# Patient Record
Sex: Female | Born: 1960
Health system: Southern US, Community
[De-identification: ages and names within clinical notes are randomized; demographics above are authoritative.]

## PROBLEM LIST (undated history)

## (undated) DIAGNOSIS — T148XXA Other injury of unspecified body region, initial encounter: Secondary | ICD-10-CM

## (undated) DIAGNOSIS — F79 Unspecified intellectual disabilities: Secondary | ICD-10-CM

## (undated) DIAGNOSIS — N179 Acute kidney failure, unspecified: Secondary | ICD-10-CM

## (undated) DIAGNOSIS — K219 Gastro-esophageal reflux disease without esophagitis: Secondary | ICD-10-CM

## (undated) DIAGNOSIS — J189 Pneumonia, unspecified organism: Secondary | ICD-10-CM

## (undated) DIAGNOSIS — G253 Myoclonus: Secondary | ICD-10-CM

## (undated) DIAGNOSIS — R519 Headache, unspecified: Secondary | ICD-10-CM

## (undated) DIAGNOSIS — R1310 Dysphagia, unspecified: Secondary | ICD-10-CM

## (undated) DIAGNOSIS — R251 Tremor, unspecified: Secondary | ICD-10-CM

## (undated) DIAGNOSIS — Z8619 Personal history of other infectious and parasitic diseases: Secondary | ICD-10-CM

## (undated) DIAGNOSIS — R569 Unspecified convulsions: Secondary | ICD-10-CM

## (undated) DIAGNOSIS — K5900 Constipation, unspecified: Secondary | ICD-10-CM

## (undated) DIAGNOSIS — G9340 Encephalopathy, unspecified: Secondary | ICD-10-CM

## (undated) DIAGNOSIS — Z515 Encounter for palliative care: Secondary | ICD-10-CM

## (undated) DIAGNOSIS — D696 Thrombocytopenia, unspecified: Secondary | ICD-10-CM

## (undated) HISTORY — DX: Other injury of unspecified body region, initial encounter: T14.8XXA

## (undated) HISTORY — PX: MOUTH SURGERY: SHX715

## (undated) HISTORY — DX: Unspecified convulsions: R56.9

## (undated) HISTORY — PX: COLONOSCOPY: SHX174

## (undated) HISTORY — PX: TONSILLECTOMY AND ADENOIDECTOMY: SUR1326

## (undated) HISTORY — PX: CATARACT EXTRACTION: SUR2

## (undated) HISTORY — PX: TOTAL ABDOMINAL HYSTERECTOMY: SHX209

## (undated) HISTORY — DX: Gastro-esophageal reflux disease without esophagitis: K21.9

## (undated) HISTORY — DX: Unspecified intellectual disabilities: F79

## (undated) HISTORY — DX: Personal history of other infectious and parasitic diseases: Z86.19

---

## 1998-11-03 ENCOUNTER — Other Ambulatory Visit: Admission: RE | Admit: 1998-11-03 | Discharge: 1998-11-03 | Payer: Self-pay | Admitting: *Deleted

## 1999-01-08 ENCOUNTER — Other Ambulatory Visit: Admission: RE | Admit: 1999-01-08 | Discharge: 1999-01-08 | Payer: Self-pay | Admitting: *Deleted

## 1999-02-13 ENCOUNTER — Other Ambulatory Visit: Admission: RE | Admit: 1999-02-13 | Discharge: 1999-02-13 | Payer: Self-pay | Admitting: *Deleted

## 1999-02-13 ENCOUNTER — Encounter (INDEPENDENT_AMBULATORY_CARE_PROVIDER_SITE_OTHER): Payer: Self-pay | Admitting: Specialist

## 1999-03-23 ENCOUNTER — Encounter: Payer: Self-pay | Admitting: *Deleted

## 1999-03-26 ENCOUNTER — Observation Stay (HOSPITAL_COMMUNITY): Admission: RE | Admit: 1999-03-26 | Discharge: 1999-03-27 | Payer: Self-pay | Admitting: *Deleted

## 1999-03-26 ENCOUNTER — Encounter (INDEPENDENT_AMBULATORY_CARE_PROVIDER_SITE_OTHER): Payer: Self-pay

## 1999-10-26 ENCOUNTER — Encounter: Payer: Self-pay | Admitting: Emergency Medicine

## 1999-10-26 ENCOUNTER — Emergency Department (HOSPITAL_COMMUNITY): Admission: EM | Admit: 1999-10-26 | Discharge: 1999-10-26 | Payer: Self-pay | Admitting: Emergency Medicine

## 2000-01-08 ENCOUNTER — Other Ambulatory Visit: Admission: RE | Admit: 2000-01-08 | Discharge: 2000-01-08 | Payer: Self-pay | Admitting: *Deleted

## 2001-01-12 ENCOUNTER — Other Ambulatory Visit: Admission: RE | Admit: 2001-01-12 | Discharge: 2001-01-12 | Payer: Self-pay | Admitting: *Deleted

## 2001-03-02 ENCOUNTER — Ambulatory Visit (HOSPITAL_COMMUNITY): Admission: RE | Admit: 2001-03-02 | Discharge: 2001-03-02 | Payer: Self-pay | Admitting: Family Medicine

## 2001-03-02 ENCOUNTER — Encounter: Payer: Self-pay | Admitting: Family Medicine

## 2002-11-12 ENCOUNTER — Ambulatory Visit (HOSPITAL_COMMUNITY): Admission: RE | Admit: 2002-11-12 | Discharge: 2002-11-12 | Payer: Self-pay | Admitting: Internal Medicine

## 2003-04-26 ENCOUNTER — Ambulatory Visit (HOSPITAL_COMMUNITY): Admission: RE | Admit: 2003-04-26 | Discharge: 2003-04-26 | Payer: Self-pay | Admitting: Internal Medicine

## 2004-04-17 ENCOUNTER — Ambulatory Visit (HOSPITAL_COMMUNITY): Admission: RE | Admit: 2004-04-17 | Discharge: 2004-04-17 | Payer: Self-pay | Admitting: Family Medicine

## 2004-04-25 ENCOUNTER — Ambulatory Visit (HOSPITAL_COMMUNITY): Admission: RE | Admit: 2004-04-25 | Discharge: 2004-04-25 | Payer: Self-pay | Admitting: Family Medicine

## 2004-07-23 ENCOUNTER — Ambulatory Visit (HOSPITAL_COMMUNITY): Admission: RE | Admit: 2004-07-23 | Discharge: 2004-07-23 | Payer: Self-pay | Admitting: Obstetrics and Gynecology

## 2004-08-14 ENCOUNTER — Ambulatory Visit: Payer: Self-pay | Admitting: Internal Medicine

## 2004-08-24 ENCOUNTER — Ambulatory Visit: Payer: Self-pay | Admitting: Internal Medicine

## 2004-08-24 ENCOUNTER — Ambulatory Visit (HOSPITAL_COMMUNITY): Admission: RE | Admit: 2004-08-24 | Discharge: 2004-08-24 | Payer: Self-pay | Admitting: Internal Medicine

## 2004-11-26 ENCOUNTER — Ambulatory Visit: Payer: Self-pay | Admitting: Internal Medicine

## 2005-07-10 ENCOUNTER — Ambulatory Visit (HOSPITAL_COMMUNITY): Admission: RE | Admit: 2005-07-10 | Discharge: 2005-07-10 | Payer: Self-pay | Admitting: Family Medicine

## 2005-10-23 ENCOUNTER — Emergency Department (HOSPITAL_COMMUNITY): Admission: EM | Admit: 2005-10-23 | Discharge: 2005-10-23 | Payer: Self-pay | Admitting: Emergency Medicine

## 2005-11-26 ENCOUNTER — Ambulatory Visit: Payer: Self-pay | Admitting: Internal Medicine

## 2007-01-07 ENCOUNTER — Emergency Department (HOSPITAL_COMMUNITY): Admission: EM | Admit: 2007-01-07 | Discharge: 2007-01-07 | Payer: Self-pay | Admitting: Emergency Medicine

## 2007-07-16 ENCOUNTER — Ambulatory Visit (HOSPITAL_COMMUNITY): Admission: RE | Admit: 2007-07-16 | Discharge: 2007-07-16 | Payer: Self-pay | Admitting: Family Medicine

## 2007-07-22 ENCOUNTER — Encounter (HOSPITAL_COMMUNITY): Admission: RE | Admit: 2007-07-22 | Discharge: 2007-08-21 | Payer: Self-pay | Admitting: Oncology

## 2007-07-22 ENCOUNTER — Ambulatory Visit (HOSPITAL_COMMUNITY): Payer: Self-pay | Admitting: Oncology

## 2007-08-24 ENCOUNTER — Encounter (HOSPITAL_COMMUNITY): Admission: RE | Admit: 2007-08-24 | Discharge: 2007-09-23 | Payer: Self-pay | Admitting: Oncology

## 2007-09-16 ENCOUNTER — Ambulatory Visit (HOSPITAL_COMMUNITY): Payer: Self-pay | Admitting: Oncology

## 2007-12-21 ENCOUNTER — Encounter (HOSPITAL_COMMUNITY): Admission: RE | Admit: 2007-12-21 | Discharge: 2008-01-20 | Payer: Self-pay | Admitting: Oncology

## 2007-12-21 ENCOUNTER — Ambulatory Visit (HOSPITAL_COMMUNITY): Payer: Self-pay | Admitting: Oncology

## 2008-03-18 ENCOUNTER — Ambulatory Visit (HOSPITAL_COMMUNITY): Payer: Self-pay | Admitting: Oncology

## 2008-03-18 ENCOUNTER — Encounter (HOSPITAL_COMMUNITY): Admission: RE | Admit: 2008-03-18 | Discharge: 2008-04-17 | Payer: Self-pay | Admitting: Oncology

## 2008-06-28 ENCOUNTER — Encounter: Payer: Self-pay | Admitting: Orthopedic Surgery

## 2008-06-28 ENCOUNTER — Ambulatory Visit (HOSPITAL_COMMUNITY): Admission: RE | Admit: 2008-06-28 | Discharge: 2008-06-28 | Payer: Self-pay | Admitting: Internal Medicine

## 2008-06-30 ENCOUNTER — Encounter: Payer: Self-pay | Admitting: Orthopedic Surgery

## 2008-06-30 ENCOUNTER — Inpatient Hospital Stay (HOSPITAL_COMMUNITY): Admission: AD | Admit: 2008-06-30 | Discharge: 2008-07-01 | Payer: Self-pay

## 2008-06-30 ENCOUNTER — Encounter: Payer: Self-pay | Admitting: Emergency Medicine

## 2008-07-11 ENCOUNTER — Emergency Department (HOSPITAL_COMMUNITY): Admission: EM | Admit: 2008-07-11 | Discharge: 2008-07-11 | Payer: Self-pay | Admitting: Emergency Medicine

## 2008-07-13 ENCOUNTER — Ambulatory Visit: Payer: Self-pay | Admitting: Orthopedic Surgery

## 2008-07-13 DIAGNOSIS — S42033A Displaced fracture of lateral end of unspecified clavicle, initial encounter for closed fracture: Secondary | ICD-10-CM

## 2008-07-13 HISTORY — DX: Displaced fracture of lateral end of unspecified clavicle, initial encounter for closed fracture: S42.033A

## 2008-07-18 ENCOUNTER — Encounter: Payer: Self-pay | Admitting: Orthopedic Surgery

## 2008-07-21 ENCOUNTER — Emergency Department (HOSPITAL_COMMUNITY): Admission: EM | Admit: 2008-07-21 | Discharge: 2008-07-21 | Payer: Self-pay | Admitting: Emergency Medicine

## 2008-08-08 ENCOUNTER — Ambulatory Visit: Payer: Self-pay | Admitting: Orthopedic Surgery

## 2008-08-09 ENCOUNTER — Encounter (HOSPITAL_COMMUNITY): Admission: RE | Admit: 2008-08-09 | Discharge: 2008-09-08 | Payer: Self-pay | Admitting: Orthopedic Surgery

## 2009-05-04 ENCOUNTER — Emergency Department (HOSPITAL_COMMUNITY): Admission: EM | Admit: 2009-05-04 | Discharge: 2009-05-04 | Payer: Self-pay | Admitting: Emergency Medicine

## 2009-09-09 ENCOUNTER — Emergency Department (HOSPITAL_COMMUNITY): Admission: EM | Admit: 2009-09-09 | Discharge: 2009-09-09 | Payer: Self-pay | Admitting: Emergency Medicine

## 2009-09-14 ENCOUNTER — Emergency Department (HOSPITAL_COMMUNITY): Admission: EM | Admit: 2009-09-14 | Discharge: 2009-09-15 | Payer: Self-pay | Admitting: Emergency Medicine

## 2009-10-03 ENCOUNTER — Ambulatory Visit (HOSPITAL_COMMUNITY): Admission: RE | Admit: 2009-10-03 | Discharge: 2009-10-03 | Payer: Self-pay | Admitting: Orthopedic Surgery

## 2009-10-03 ENCOUNTER — Ambulatory Visit: Payer: Self-pay | Admitting: Vascular Surgery

## 2009-10-03 ENCOUNTER — Encounter (INDEPENDENT_AMBULATORY_CARE_PROVIDER_SITE_OTHER): Payer: Self-pay | Admitting: Orthopedic Surgery

## 2009-10-12 ENCOUNTER — Inpatient Hospital Stay (HOSPITAL_COMMUNITY): Admission: EM | Admit: 2009-10-12 | Discharge: 2009-10-19 | Payer: Self-pay | Admitting: Emergency Medicine

## 2009-10-17 ENCOUNTER — Ambulatory Visit: Payer: Self-pay | Admitting: Internal Medicine

## 2009-10-18 ENCOUNTER — Encounter (INDEPENDENT_AMBULATORY_CARE_PROVIDER_SITE_OTHER): Payer: Self-pay

## 2009-10-18 ENCOUNTER — Ambulatory Visit: Payer: Self-pay | Admitting: Internal Medicine

## 2009-10-19 ENCOUNTER — Ambulatory Visit: Payer: Self-pay | Admitting: Internal Medicine

## 2009-10-20 ENCOUNTER — Encounter: Payer: Self-pay | Admitting: Internal Medicine

## 2009-11-01 ENCOUNTER — Encounter: Payer: Self-pay | Admitting: Internal Medicine

## 2009-11-03 ENCOUNTER — Encounter: Payer: Self-pay | Admitting: Internal Medicine

## 2009-11-09 ENCOUNTER — Ambulatory Visit (HOSPITAL_COMMUNITY): Admission: RE | Admit: 2009-11-09 | Discharge: 2009-11-09 | Payer: Self-pay | Admitting: Family Medicine

## 2010-03-28 ENCOUNTER — Ambulatory Visit: Payer: Self-pay | Admitting: Internal Medicine

## 2010-04-07 ENCOUNTER — Emergency Department (HOSPITAL_COMMUNITY): Admission: EM | Admit: 2010-04-07 | Discharge: 2010-04-07 | Payer: Self-pay | Admitting: Emergency Medicine

## 2010-06-24 ENCOUNTER — Encounter: Payer: Self-pay | Admitting: Internal Medicine

## 2010-06-24 ENCOUNTER — Encounter: Payer: Self-pay | Admitting: Family Medicine

## 2010-07-03 NOTE — Letter (Signed)
Summary: Patient Notice, Endo Biopsy Results  [facility]  [address]   [place], [place] [address]   Phone: [phone]  Fax: [phone]       Oct 20, 2009   Maria [name] [address] [place], [place]  [address] 1960-06-09    Dear Maria [name],  I am pleased to inform you that the biopsies taken during your recent endoscopic examination did not show any evidence of cancer or other abnormality upon pathologic examination.  Additional information/recommendations:  No further action is needed at this time.  Please follow-up with your primary care physician for your other healthcare needs.  Continue with the treatment plan as outlined on the day of your exam.  Please call us if you are having persistent problems or have questions about your condition that have not been fully answered at this time.  Sincerely,    R. Roetta Sessions MD, FACP [facility] Ph: [phone]   Fax: [phone]   Appended Document: Patient Notice, Endo Biopsy Results Letter mailed to pt.

## 2010-07-03 NOTE — Consult Note (Signed)
Summary: Consultation Report  Consultation Report   Imported By: Peggyann Shoals 11/01/2009 13:35:34  _____________________________________________________________________  External Attachment:    Type:   Image     Comment:   External Document

## 2010-07-03 NOTE — Letter (Signed)
Summary: Discharge Summary  Discharge Summary   Imported By: Minna Merritts 11/03/2009 15:32:56  _____________________________________________________________________  External Attachment:    Type:   Image     Comment:   External Document

## 2010-08-14 LAB — DIFFERENTIAL
Basophils Relative: 0 % (ref 0–1)
Eosinophils Absolute: 0.1 10*3/uL (ref 0.0–0.7)
Monocytes Absolute: 0.6 10*3/uL (ref 0.1–1.0)
Monocytes Relative: 12 % (ref 3–12)
Neutro Abs: 2.6 10*3/uL (ref 1.7–7.7)

## 2010-08-14 LAB — URINALYSIS, ROUTINE W REFLEX MICROSCOPIC
Bilirubin Urine: NEGATIVE
Glucose, UA: NEGATIVE mg/dL
Hgb urine dipstick: NEGATIVE
Ketones, ur: NEGATIVE mg/dL
Nitrite: NEGATIVE
Protein, ur: NEGATIVE mg/dL
Specific Gravity, Urine: 1.01 (ref 1.005–1.030)
Urobilinogen, UA: 0.2 mg/dL (ref 0.0–1.0)
pH: 7.5 (ref 5.0–8.0)

## 2010-08-14 LAB — COMPREHENSIVE METABOLIC PANEL
ALT: 25 U/L (ref 0–35)
Albumin: 3.6 g/dL (ref 3.5–5.2)
Alkaline Phosphatase: 56 U/L (ref 39–117)
Potassium: 3.9 mEq/L (ref 3.5–5.1)
Sodium: 136 mEq/L (ref 135–145)
Total Protein: 6.1 g/dL (ref 6.0–8.3)

## 2010-08-14 LAB — CBC
Platelets: 145 10*3/uL — ABNORMAL LOW (ref 150–400)
RDW: 13.3 % (ref 11.5–15.5)
WBC: 5 10*3/uL (ref 4.0–10.5)

## 2010-08-14 LAB — PROTIME-INR: INR: 0.97 (ref 0.00–1.49)

## 2010-08-14 LAB — PREGNANCY, URINE: Preg Test, Ur: NEGATIVE

## 2010-08-20 LAB — DIFFERENTIAL
Basophils Absolute: 0 10*3/uL (ref 0.0–0.1)
Basophils Absolute: 0 10*3/uL (ref 0.0–0.1)
Basophils Absolute: 0 10*3/uL (ref 0.0–0.1)
Basophils Absolute: 0 10*3/uL (ref 0.0–0.1)
Basophils Absolute: 0 10*3/uL (ref 0.0–0.1)
Basophils Relative: 0 % (ref 0–1)
Basophils Relative: 0 % (ref 0–1)
Basophils Relative: 1 % (ref 0–1)
Basophils Relative: 1 % (ref 0–1)
Eosinophils Absolute: 0 10*3/uL (ref 0.0–0.7)
Eosinophils Absolute: 0 10*3/uL (ref 0.0–0.7)
Eosinophils Absolute: 0 10*3/uL (ref 0.0–0.7)
Eosinophils Absolute: 0.1 10*3/uL (ref 0.0–0.7)
Eosinophils Absolute: 0.2 10*3/uL (ref 0.0–0.7)
Eosinophils Relative: 0 % (ref 0–5)
Eosinophils Relative: 1 % (ref 0–5)
Eosinophils Relative: 1 % (ref 0–5)
Eosinophils Relative: 3 % (ref 0–5)
Lymphocytes Relative: 13 % (ref 12–46)
Lymphocytes Relative: 36 % (ref 12–46)
Lymphocytes Relative: 37 % (ref 12–46)
Lymphocytes Relative: 41 % (ref 12–46)
Lymphs Abs: 0.7 10*3/uL (ref 0.7–4.0)
Lymphs Abs: 0.9 10*3/uL (ref 0.7–4.0)
Lymphs Abs: 1 10*3/uL (ref 0.7–4.0)
Lymphs Abs: 1.5 10*3/uL (ref 0.7–4.0)
Monocytes Absolute: 0.4 10*3/uL (ref 0.1–1.0)
Monocytes Absolute: 0.4 10*3/uL (ref 0.1–1.0)
Monocytes Absolute: 0.5 10*3/uL (ref 0.1–1.0)
Monocytes Absolute: 0.7 10*3/uL (ref 0.1–1.0)
Monocytes Relative: 14 % — ABNORMAL HIGH (ref 3–12)
Monocytes Relative: 9 % (ref 3–12)
Neutro Abs: 1.3 10*3/uL — ABNORMAL LOW (ref 1.7–7.7)
Neutro Abs: 1.4 10*3/uL — ABNORMAL LOW (ref 1.7–7.7)
Neutro Abs: 1.6 10*3/uL — ABNORMAL LOW (ref 1.7–7.7)
Neutro Abs: 4.2 10*3/uL (ref 1.7–7.7)
Neutrophils Relative %: 43 % (ref 43–77)
Neutrophils Relative %: 43 % (ref 43–77)
Neutrophils Relative %: 50 % (ref 43–77)
Neutrophils Relative %: 58 % (ref 43–77)
Neutrophils Relative %: 78 % — ABNORMAL HIGH (ref 43–77)

## 2010-08-20 LAB — CBC
HCT: 22.2 % — ABNORMAL LOW (ref 36.0–46.0)
HCT: 24.2 % — ABNORMAL LOW (ref 36.0–46.0)
HCT: 24.5 % — ABNORMAL LOW (ref 36.0–46.0)
HCT: 26.6 % — ABNORMAL LOW (ref 36.0–46.0)
Hemoglobin: 8 g/dL — ABNORMAL LOW (ref 12.0–15.0)
Hemoglobin: 8.1 g/dL — ABNORMAL LOW (ref 12.0–15.0)
Hemoglobin: 8.5 g/dL — ABNORMAL LOW (ref 12.0–15.0)
Hemoglobin: 9.4 g/dL — ABNORMAL LOW (ref 12.0–15.0)
MCHC: 35.4 g/dL (ref 30.0–36.0)
MCHC: 35.4 g/dL (ref 30.0–36.0)
MCHC: 36.1 g/dL — ABNORMAL HIGH (ref 30.0–36.0)
MCHC: 36.1 g/dL — ABNORMAL HIGH (ref 30.0–36.0)
MCV: 92.6 fL (ref 78.0–100.0)
MCV: 93.1 fL (ref 78.0–100.0)
MCV: 93.2 fL (ref 78.0–100.0)
MCV: 93.6 fL (ref 78.0–100.0)
MCV: 93.8 fL (ref 78.0–100.0)
Platelets: 129 10*3/uL — ABNORMAL LOW (ref 150–400)
Platelets: 151 10*3/uL (ref 150–400)
Platelets: 71 10*3/uL — ABNORMAL LOW (ref 150–400)
Platelets: 75 10*3/uL — ABNORMAL LOW (ref 150–400)
Platelets: 83 10*3/uL — ABNORMAL LOW (ref 150–400)
Platelets: 93 10*3/uL — ABNORMAL LOW (ref 150–400)
RBC: 2.4 MIL/uL — ABNORMAL LOW (ref 3.87–5.11)
RBC: 2.48 MIL/uL — ABNORMAL LOW (ref 3.87–5.11)
RBC: 2.85 MIL/uL — ABNORMAL LOW (ref 3.87–5.11)
RDW: 13.1 % (ref 11.5–15.5)
RDW: 13.2 % (ref 11.5–15.5)
RDW: 13.3 % (ref 11.5–15.5)
RDW: 13.4 % (ref 11.5–15.5)
RDW: 13.5 % (ref 11.5–15.5)
RDW: 13.7 % (ref 11.5–15.5)
WBC: 2.9 10*3/uL — ABNORMAL LOW (ref 4.0–10.5)
WBC: 3.4 10*3/uL — ABNORMAL LOW (ref 4.0–10.5)
WBC: 4.2 10*3/uL (ref 4.0–10.5)
WBC: 5.4 10*3/uL (ref 4.0–10.5)

## 2010-08-20 LAB — BASIC METABOLIC PANEL
BUN: 4 mg/dL — ABNORMAL LOW (ref 6–23)
BUN: 4 mg/dL — ABNORMAL LOW (ref 6–23)
BUN: 6 mg/dL (ref 6–23)
BUN: 6 mg/dL (ref 6–23)
BUN: 9 mg/dL (ref 6–23)
CO2: 24 mEq/L (ref 19–32)
CO2: 24 mEq/L (ref 19–32)
CO2: 25 mEq/L (ref 19–32)
CO2: 27 mEq/L (ref 19–32)
Calcium: 8.1 mg/dL — ABNORMAL LOW (ref 8.4–10.5)
Calcium: 8.2 mg/dL — ABNORMAL LOW (ref 8.4–10.5)
Calcium: 8.2 mg/dL — ABNORMAL LOW (ref 8.4–10.5)
Calcium: 8.5 mg/dL (ref 8.4–10.5)
Chloride: 100 mEq/L (ref 96–112)
Chloride: 102 mEq/L (ref 96–112)
Chloride: 102 mEq/L (ref 96–112)
Chloride: 106 mEq/L (ref 96–112)
Creatinine, Ser: 0.7 mg/dL (ref 0.4–1.2)
Creatinine, Ser: 0.71 mg/dL (ref 0.4–1.2)
Creatinine, Ser: 0.75 mg/dL (ref 0.4–1.2)
Creatinine, Ser: 0.78 mg/dL (ref 0.4–1.2)
GFR calc Af Amer: >60 mL/min (ref >60)
GFR calc Af Amer: >60 mL/min (ref >60)
GFR calc Af Amer: >60 mL/min (ref >60)
GFR calc non Af Amer: >60 mL/min (ref >60)
GFR calc non Af Amer: >60 mL/min (ref >60)
GFR calc non Af Amer: >60 mL/min (ref >60)
Glucose, Bld: 103 mg/dL — ABNORMAL HIGH (ref 70–99)
Glucose, Bld: 104 mg/dL — ABNORMAL HIGH (ref 70–99)
Glucose, Bld: 112 mg/dL — ABNORMAL HIGH (ref 70–99)
Glucose, Bld: 97 mg/dL (ref 70–99)
Potassium: 4.3 mEq/L (ref 3.5–5.1)
Potassium: 4.4 mEq/L (ref 3.5–5.1)
Potassium: 4.5 mEq/L (ref 3.5–5.1)
Sodium: 131 mEq/L — ABNORMAL LOW (ref 135–145)
Sodium: 132 mEq/L — ABNORMAL LOW (ref 135–145)
Sodium: 138 mEq/L (ref 135–145)
Sodium: 139 mEq/L (ref 135–145)

## 2010-08-20 LAB — HEPARIN INDUCED THROMBOCYTOPENIA PNL
Patient O.D.: 0.123
UFH High Dose UFH H: 0 % Release
UFH Low Dose 0.5 IU/mL: 0 % Release
UFH SRA Result: NEGATIVE

## 2010-08-20 LAB — PROTIME-INR
INR: 0.96 (ref 0.00–1.49)
Prothrombin Time: 12.7 seconds (ref 11.6–15.2)

## 2010-08-20 LAB — CORTISOL-AM, BLOOD: Cortisol - AM: 11 ug/dL (ref 4.3–22.4)

## 2010-08-20 LAB — VANCOMYCIN, TROUGH: Vancomycin Tr: 12.2 ug/mL (ref 10.0–20.0)

## 2010-08-20 LAB — CLOSTRIDIUM DIFFICILE EIA

## 2010-08-20 LAB — MAGNESIUM: Magnesium: 1.8 mg/dL (ref 1.5–2.5)

## 2010-08-21 LAB — URINE MICROSCOPIC-ADD ON

## 2010-08-21 LAB — COMPREHENSIVE METABOLIC PANEL
ALT: 32 U/L (ref 0–35)
ALT: 43 U/L — ABNORMAL HIGH (ref 0–35)
AST: 37 U/L (ref 0–37)
Alkaline Phosphatase: 62 U/L (ref 39–117)
BUN: 17 mg/dL (ref 6–23)
CO2: 24 mEq/L (ref 19–32)
CO2: 25 mEq/L (ref 19–32)
Calcium: 8.5 mg/dL (ref 8.4–10.5)
Calcium: 9.2 mg/dL (ref 8.4–10.5)
Chloride: 98 mEq/L (ref 96–112)
GFR calc Af Amer: >60 mL/min (ref >60)
GFR calc non Af Amer: >60 mL/min (ref >60)
GFR calc non Af Amer: >60 mL/min (ref >60)
Glucose, Bld: 115 mg/dL — ABNORMAL HIGH (ref 70–99)
Potassium: 3.5 mEq/L (ref 3.5–5.1)
Potassium: 4.2 mEq/L (ref 3.5–5.1)
Sodium: 130 mEq/L — ABNORMAL LOW (ref 135–145)
Sodium: 132 mEq/L — ABNORMAL LOW (ref 135–145)
Total Bilirubin: 0.6 mg/dL (ref 0.3–1.2)

## 2010-08-21 LAB — PREGNANCY, URINE: Preg Test, Ur: NEGATIVE

## 2010-08-21 LAB — CARDIAC PANEL(CRET KIN+CKTOT+MB+TROPI)
CK, MB: 0.4 ng/mL (ref 0.3–4.0)
Relative Index: INVALID (ref 0.0–2.5)
Relative Index: INVALID (ref 0.0–2.5)
Relative Index: INVALID (ref 0.0–2.5)
Total CK: 86 U/L (ref 7–177)
Total CK: 94 U/L (ref 7–177)

## 2010-08-21 LAB — URINALYSIS, ROUTINE W REFLEX MICROSCOPIC
Bilirubin Urine: NEGATIVE
Glucose, UA: NEGATIVE mg/dL
Glucose, UA: NEGATIVE mg/dL
Hgb urine dipstick: NEGATIVE
Leukocytes, UA: NEGATIVE
Nitrite: NEGATIVE
Protein, ur: NEGATIVE mg/dL
Protein, ur: NEGATIVE mg/dL
Specific Gravity, Urine: 1.025 (ref 1.005–1.030)
Urobilinogen, UA: 0.2 mg/dL (ref 0.0–1.0)
Urobilinogen, UA: 0.2 mg/dL (ref 0.0–1.0)
pH: 5.5 (ref 5.0–8.0)

## 2010-08-21 LAB — CBC
HCT: 25.4 % — ABNORMAL LOW (ref 36.0–46.0)
HCT: 30.6 % — ABNORMAL LOW (ref 36.0–46.0)
Hemoglobin: 11 g/dL — ABNORMAL LOW (ref 12.0–15.0)
Hemoglobin: 9 g/dL — ABNORMAL LOW (ref 12.0–15.0)
MCHC: 35.3 g/dL (ref 30.0–36.0)
MCHC: 35.9 g/dL (ref 30.0–36.0)
MCV: 93.9 fL (ref 78.0–100.0)
RBC: 2.71 MIL/uL — ABNORMAL LOW (ref 3.87–5.11)
RBC: 2.85 MIL/uL — ABNORMAL LOW (ref 3.87–5.11)
RBC: 3.3 MIL/uL — ABNORMAL LOW (ref 3.87–5.11)
WBC: 8.5 10*3/uL (ref 4.0–10.5)

## 2010-08-21 LAB — CARBAMAZEPINE LEVEL, TOTAL: Carbamazepine Lvl: 8.6 ug/mL (ref 4.0–12.0)

## 2010-08-21 LAB — BASIC METABOLIC PANEL
BUN: 13 mg/dL (ref 6–23)
CO2: 23 mEq/L (ref 19–32)
CO2: 26 mEq/L (ref 19–32)
Calcium: 8 mg/dL — ABNORMAL LOW (ref 8.4–10.5)
Chloride: 96 mEq/L (ref 96–112)
Creatinine, Ser: 0.91 mg/dL (ref 0.4–1.2)
GFR calc Af Amer: >60 mL/min (ref >60)
GFR calc non Af Amer: >60 mL/min (ref >60)
Glucose, Bld: 105 mg/dL — ABNORMAL HIGH (ref 70–99)
Glucose, Bld: 119 mg/dL — ABNORMAL HIGH (ref 70–99)
Potassium: 3.3 mEq/L — ABNORMAL LOW (ref 3.5–5.1)
Potassium: 4.7 mEq/L (ref 3.5–5.1)
Sodium: 125 mEq/L — ABNORMAL LOW (ref 135–145)
Sodium: 128 mEq/L — ABNORMAL LOW (ref 135–145)

## 2010-08-21 LAB — VALPROIC ACID LEVEL: Valproic Acid Lvl: 67 ug/mL (ref 50.0–100.0)

## 2010-08-21 LAB — MYCOPLASMA / UREAPLASMA CULTURE

## 2010-08-21 LAB — DIFFERENTIAL
Basophils Absolute: 0 10*3/uL (ref 0.0–0.1)
Basophils Relative: 0 % (ref 0–1)
Eosinophils Absolute: 0 10*3/uL (ref 0.0–0.7)
Eosinophils Absolute: 0 10*3/uL (ref 0.0–0.7)
Eosinophils Relative: 0 % (ref 0–5)
Lymphocytes Relative: 4 % — ABNORMAL LOW (ref 12–46)
Lymphs Abs: 0.8 10*3/uL (ref 0.7–4.0)
Neutro Abs: 6.1 10*3/uL (ref 1.7–7.7)
Neutro Abs: 6.5 10*3/uL (ref 1.7–7.7)
Neutrophils Relative %: 83 % — ABNORMAL HIGH (ref 43–77)

## 2010-08-21 LAB — CULTURE, BLOOD (ROUTINE X 2): Culture: NO GROWTH

## 2010-08-21 LAB — LIPID PANEL
Cholesterol: 169 mg/dL (ref 0–200)
Total CHOL/HDL Ratio: 3.6 RATIO
VLDL: 14 mg/dL (ref 0–40)

## 2010-08-21 LAB — SODIUM, URINE, RANDOM: Sodium, Ur: 27 mEq/L

## 2010-08-21 LAB — OSMOLALITY, URINE: Osmolality, Ur: 523 mOsm/kg (ref 390–1090)

## 2010-08-21 LAB — CREATININE, URINE, RANDOM: Creatinine, Urine: 154.64 mg/dL

## 2010-08-22 LAB — DIFFERENTIAL
Lymphocytes Relative: 27 % (ref 12–46)
Lymphs Abs: 1.6 10*3/uL (ref 0.7–4.0)
Neutro Abs: 3.6 10*3/uL (ref 1.7–7.7)
Neutrophils Relative %: 62 % (ref 43–77)

## 2010-08-22 LAB — BASIC METABOLIC PANEL
BUN: 20 mg/dL (ref 6–23)
Creatinine, Ser: 0.99 mg/dL (ref 0.4–1.2)
GFR calc non Af Amer: >60 mL/min (ref >60)
Potassium: 4.2 mEq/L (ref 3.5–5.1)

## 2010-08-22 LAB — URINALYSIS, ROUTINE W REFLEX MICROSCOPIC
Nitrite: NEGATIVE
Specific Gravity, Urine: 1.01 (ref 1.005–1.030)
pH: 7 (ref 5.0–8.0)

## 2010-08-22 LAB — RAPID URINE DRUG SCREEN, HOSP PERFORMED
Cocaine: NOT DETECTED
Tetrahydrocannabinol: NOT DETECTED

## 2010-08-22 LAB — ETHANOL: Alcohol, Ethyl (B): <5 mg/dL (ref 0–10)

## 2010-08-22 LAB — CBC
HCT: 31 % — ABNORMAL LOW (ref 36.0–46.0)
Platelets: 145 10*3/uL — ABNORMAL LOW (ref 150–400)
WBC: 5.8 10*3/uL (ref 4.0–10.5)

## 2010-08-22 LAB — VALPROIC ACID LEVEL: Valproic Acid Lvl: 48 ug/mL — ABNORMAL LOW (ref 50.0–100.0)

## 2010-09-04 LAB — BASIC METABOLIC PANEL
GFR calc non Af Amer: >60 mL/min (ref >60)
Potassium: 4.1 mEq/L (ref 3.5–5.1)
Sodium: 140 mEq/L (ref 135–145)

## 2010-09-04 LAB — CARBAMAZEPINE LEVEL, TOTAL: Carbamazepine Lvl: 4.6 ug/mL (ref 4.0–12.0)

## 2010-09-04 LAB — CBC
HCT: 37.4 % (ref 36.0–46.0)
Hemoglobin: 12.8 g/dL (ref 12.0–15.0)
Platelets: 168 10*3/uL (ref 150–400)
WBC: 5.9 10*3/uL (ref 4.0–10.5)

## 2010-09-04 LAB — VALPROIC ACID LEVEL: Valproic Acid Lvl: 92 ug/mL (ref 50.0–100.0)

## 2010-09-04 LAB — DIFFERENTIAL
Eosinophils Relative: 1 % (ref 0–5)
Lymphocytes Relative: 21 % (ref 12–46)
Lymphs Abs: 1.2 10*3/uL (ref 0.7–4.0)
Neutro Abs: 4 10*3/uL (ref 1.7–7.7)

## 2010-09-17 LAB — DIFFERENTIAL
Basophils Absolute: 0 10*3/uL (ref 0.0–0.1)
Eosinophils Absolute: 0 10*3/uL (ref 0.0–0.7)
Eosinophils Relative: 3 % (ref 0–5)
Eosinophils Relative: 0 % (ref 0–5)
Lymphocytes Relative: 29 % (ref 12–46)
Lymphocytes Relative: 15 % (ref 12–46)
Lymphs Abs: 2 10*3/uL (ref 0.7–4.0)
Lymphs Abs: 1.4 10*3/uL (ref 0.7–4.0)
Monocytes Absolute: 0.6 10*3/uL (ref 0.1–1.0)
Monocytes Relative: 9 % (ref 3–12)
Neutro Abs: 4 10*3/uL (ref 1.7–7.7)
Neutrophils Relative %: 80 % — ABNORMAL HIGH (ref 43–77)

## 2010-09-17 LAB — CBC
HCT: 31.5 % — ABNORMAL LOW (ref 36.0–46.0)
Hemoglobin: 10.9 g/dL — ABNORMAL LOW (ref 12.0–15.0)
MCV: 98.5 fL (ref 78.0–100.0)
Platelets: 161 10*3/uL (ref 150–400)
RBC: 3.2 MIL/uL — ABNORMAL LOW (ref 3.87–5.11)
RDW: 13.6 % (ref 11.5–15.5)
RDW: 13.5 % (ref 11.5–15.5)
WBC: 6.8 10*3/uL (ref 4.0–10.5)
WBC: 9.7 10*3/uL (ref 4.0–10.5)

## 2010-09-17 LAB — URINALYSIS, ROUTINE W REFLEX MICROSCOPIC
Bilirubin Urine: NEGATIVE
Ketones, ur: 40 mg/dL — AB
Nitrite: NEGATIVE
Protein, ur: NEGATIVE mg/dL
Urobilinogen, UA: 1 mg/dL (ref 0.0–1.0)
pH: 8 (ref 5.0–8.0)

## 2010-09-17 LAB — BASIC METABOLIC PANEL
GFR calc Af Amer: >60 mL/min (ref >60)
GFR calc non Af Amer: >60 mL/min (ref >60)
Glucose, Bld: 101 mg/dL — ABNORMAL HIGH (ref 70–99)
Potassium: 3.9 mEq/L (ref 3.5–5.1)
Sodium: 137 mEq/L (ref 135–145)

## 2010-09-17 LAB — RAPID URINE DRUG SCREEN, HOSP PERFORMED: Tetrahydrocannabinol: NOT DETECTED

## 2010-09-17 LAB — VALPROIC ACID LEVEL
Valproic Acid Lvl: 55.8 ug/mL (ref 50.0–100.0)
Valproic Acid Lvl: 100 ug/mL (ref 50.0–100.0)

## 2010-09-17 LAB — POCT I-STAT, CHEM 8
Calcium, Ion: 1.24 mmol/L (ref 1.12–1.32)
Chloride: 102 mEq/L (ref 96–112)
Glucose, Bld: 101 mg/dL — ABNORMAL HIGH (ref 70–99)
HCT: 38 % (ref 36.0–46.0)
Hemoglobin: 12.9 g/dL (ref 12.0–15.0)

## 2010-09-17 LAB — CARBAMAZEPINE LEVEL, TOTAL: Carbamazepine Lvl: 17.6 ug/mL (ref 4.0–12.0)

## 2010-09-18 LAB — COMPREHENSIVE METABOLIC PANEL
AST: 15 U/L (ref 0–37)
Albumin: 4 g/dL (ref 3.5–5.2)
Alkaline Phosphatase: 73 U/L (ref 39–117)
BUN: 22 mg/dL (ref 6–23)
Chloride: 91 mEq/L — ABNORMAL LOW (ref 96–112)
Creatinine, Ser: 0.9 mg/dL (ref 0.4–1.2)
GFR calc Af Amer: >60 mL/min (ref >60)
Potassium: 3.6 mEq/L (ref 3.5–5.1)
Total Protein: 6.6 g/dL (ref 6.0–8.3)

## 2010-09-18 LAB — DIFFERENTIAL
Eosinophils Relative: 1 % (ref 0–5)
Lymphocytes Relative: 23 % (ref 12–46)
Monocytes Absolute: 0.4 10*3/uL (ref 0.1–1.0)
Monocytes Relative: 6 % (ref 3–12)
Neutro Abs: 3.9 10*3/uL (ref 1.7–7.7)

## 2010-09-18 LAB — CBC
HCT: 38.4 % (ref 36.0–46.0)
Platelets: 132 10*3/uL — ABNORMAL LOW (ref 150–400)
RDW: 13.6 % (ref 11.5–15.5)
WBC: 5.7 10*3/uL (ref 4.0–10.5)

## 2010-09-18 LAB — URINALYSIS, ROUTINE W REFLEX MICROSCOPIC
Glucose, UA: NEGATIVE mg/dL
Protein, ur: NEGATIVE mg/dL
Urobilinogen, UA: 0.2 mg/dL (ref 0.0–1.0)

## 2010-09-18 LAB — PREGNANCY, URINE: Preg Test, Ur: NEGATIVE

## 2010-09-18 LAB — CARBAMAZEPINE LEVEL, TOTAL: Carbamazepine Lvl: 10.7 ug/mL (ref 4.0–12.0)

## 2010-09-18 LAB — VALPROIC ACID LEVEL: Valproic Acid Lvl: 62 ug/mL (ref 50.0–100.0)

## 2010-10-16 NOTE — H&P (Addendum)
NAME:  Maria [name], Maria [name] NO.:  [id number]   MEDICAL RECORD NO.:  [id number]          PATIENT TYPE:  INP   LOCATION:  A313                          FACILITY:  [facility]   PHYSICIAN:  Skeet Latch, DO    DATE OF BIRTH:  05/08/1961   DATE OF ADMISSION:  06/30/2008  DATE OF DISCHARGE:  LH                              HISTORY & PHYSICAL   PRIMARY CARE PHYSICIAN:  Patrica Duel, M.D.   CHIEF COMPLAINT:  Dizziness.   HISTORY OF PRESENT ILLNESS:  This is a 50 year old female who presents  with family members with a complaint of dizziness, ataxia, and some  mental status changes.  Apparently this evening the patient presented to  [facility] ER with the above symptoms.  The patient has a history of  mental retardation, seizure disorder, and a Tegretol and Depakote level  was performed and her Tegretol level was elevated.  The patient had a CT  of her head performed which was unremarkable for any acute changes.  We  were called secondary to the patient living in [place] and secondary  to adverse weather supposedly on its way in the next day or so and the  mother wanted the patient to be admitted at [facility] to be  closer to home.  So far her workup has been unremarkable except for  elevated Tegretol.  Family members state that the patient has had  increased problems with ambulation due to frequent falls and did  fracture a collarbone recently.   PAST MEDICAL HISTORY:  1. Depression.  2. Acid reflux.  3. Mental retardation.  4. Seizure disorder.   PAST SURGICAL HISTORY:  Hysterectomy.   SOCIAL HISTORY:  No history of alcohol or drug abuse.  She is a  cigarette smoker.   ALLERGIES:  CODEINE.   HOME MEDICATIONS:  1. __________ 300 mg in the morning, 600 mg at bedtime.  2. Omeprazole 20 mg twice a day.  3. Depakote ER 500 mg in the morning and 1000 mg in the evening.  4. She is on a children's vitamin daily.  5. Calcium with Vitamin D two times a day.  6. Xalatan ophthalmic eye drops at bedtime.   REVIEW OF SYSTEMS:  GENERAL:  No fever, no weight gain.  Positive for  decreased appetite.  No weight loss.  HEENT:  Unremarkable.  CARDIOVASCULAR:  No chest pain or palpitations.  RESPIRATORY:  No cough,  wheeze, or ____ QA MARKER: 166 ___.  GENI  urgency, or frequency.  MUSCULOSKELETAL:  Positive for right  shoulder/pain in her collarbone area.  GASTROINTESTINAL:  Slight  abdominal pain.  No diarrhea.  Other systems are unremarkable.   PHYSICAL EXAMINATION:  VITAL SIGNS:  Temperature 98.6, respiratory rate  15, pulse 81, blood pressure 113/75.  GENERAL:  This is a well-developed, well-nourished, well-hydrated female  in no acute distress.  She is awake and alert.  HEENT:  Head is atraumatic, normocephalic.  Eyes:  PERRLA.  EOMI.  NECK:  Soft, supple, nontender, nondistended.  CARDIOVASCULAR:  Regular rate and rhythm.  No murmurs, rubs, or gallops.  LUNGS:  Clear to auscultation bilaterally.  No rales, rhonchi, or  wheezes.  ABDOMEN:  Soft.  There is some slight tenderness in the mid abdomen  area.  No rigidity or guarding.  Positive bowel sounds.  EXTREMITIES:  No clubbing, cyanosis, or edema.  She does tenderness to  palpation of her right clavicle area.  NEUROLOGIC:  Cranial nerves II-XII are grossly intact.  Patient alert  and oriented x3.  Moves all extremities.   LABORATORY DATA:  Sodium 136, potassium 4.1, chloride 102, BUN 17,  creatinine 1.1, glucose 101, ionized calcium is 1.24.  Hemoglobin 12.9,  hematocrit 38, white count 9.7, platelet count is 161,000.   Right shoulder x-ray showed a boot fracture of the lateral aspect of the  clavicle approximately 2 cm from acromioclavicular articulation, lateral  to the level of the coracoclavicular ligament.  Her acromioclavicular  and coracoclavicular relationship appear preserved.  The fracture was  displaced 2-3 mm, no other acute bony abnormalities.  X-ray of her left  hip was  unremarkable.  CT of her head showed negative for bleed or other  acute intracranial processes.  Stable diffuse atrophy, focal right  frontal and parenchymal wall.   Her carbazocine level was 17.6, Depakote level was 100.   ASSESSMENT:  This is a 50 year old female with history of mental  retardation and seizure disorder who presented with some dizziness,  ataxia, and frequent falls over the past week.  Apparently the patient  fell and fractured her right clavicle.  The patient presented to [facility] with the above symptoms, was found to have an elevated  Tegretol level and was transferred to [facility] secondary to  the patient living in the [place] area.Marland Kitchen   PLAN:  1. The patient will be admitted to the service of [facility] bed.  2. For her mental status changes/dizziness probably secondary to      elevated Tegretol level, the patient's Depakote level is on the      high end of normal also.  At this time we will hold both      medications, then get a neurology consult.  3. For history of gastroesophageal reflux disease the patient was      placed on reflux agent at this time.  4. The patient will followed very closely.  5. The patient will be on DVT as well as GI prophylaxis.      Skeet Latch, DO  Electronically Signed     SM/MEDQ  D:  06/30/2008  T:  07/01/2008  Job:  [id number]   cc:   Patrica Duel, M.D.  Fax: [phone]

## 2010-10-16 NOTE — Consult Note (Signed)
NAME:  Maria [name], Maria [name]               ACCOUNT NO.:  [id number]   MEDICAL RECORD NO.:  [id number]          PATIENT TYPE:  INP   LOCATION:  A313                          FACILITY:  [facility]   PHYSICIAN:  Kofi A. Gerilyn Pilgrim, M.D. DATE OF BIRTH:  [birth year]   DATE OF CONSULTATION:  DATE OF DISCHARGE:  07/01/2008                                 CONSULTATION   REASON FOR CONSULTATION:  Dizziness.   The patient is a 50 year old white female who has a baseline history of  epilepsy, has been intractable.  There is also a history of mental  retardation that appears to be mild.  The patient lives independtly  although she has close support with mother and other siblings.  The  patient has had dizziness, ataxia, and some change in mentation.  Recently, she has had a couple of falls, one recently resulted in  fracture of the clavicle.  The patient was evaluated in the emergency  room and was found to have an elevated carbamazepine level and  subsequently admitted.  The family reports that she has been on  phenobarbital and Dilantin in the past for seizures.  She sees  neurologist in [place] presumed from the [facility].  She has  been placed on some Depakote in the past in the recent years, this has  made a dramatic improvement in the seizures.  She previously would have  multiple seizures a day, sometimes  6 seizures or  more, but with the  Depakote and Tegretol, she now only has about one a month.  It does not  appear to be that there have been any changes in the medication at least  not recently that I can ascertain.  She has had a couple of falls per  the sister related to her seizures in addition to the recent onset of  dizziness.   PAST MEDICAL HISTORY:  1. Epilepsy.  2. Mental retardation.  3. Mild gastroesophageal reflux disease.  4. Depression.   PAST SURGICAL HISTORY:  Hysterectomy.   SOCIAL HISTORY:  She lives independently, but has close family support.  No alcohol use.  The  patient is a smoker.  No illicit drug use.   ALLERGIES:  CODEINE.   HOME MEDICATIONS:  1. Carbatrol 300 mg 1 in the morning and 2 at bedtime.  2. Depakote ER 500 mg in the morning and 1000 mg at bedtime.  3. Calcium with vitamin D b.i.d.  4. Children's Vicodin.  5. Omeprazole 20 mg b.i.d.   REVIEW OF SYSTEMS:  As stated in the history of present illness,  otherwise unrevealing.   PHYSICAL EXAMINATION:  GENERAL:  A thin pleasant lady, in no acute  distress.  VITAL SIGNS:  Temperature 98.2, pulse 83, respirations 20, blood  pressure 136/77.  HEENT:  Head is normocephalic, atraumatic.  NECK:  Supple.  EXTREMITIES:  The left upper extremity is in a sling.  She does have  some superficial ecchymotic spots in the legs, apparently from falling.  These are moderate in size however.  MENTATION:  The patient is awake and alert.  She does converse and is  cooperative with evaluation.  CRANIAL NERVE:  Pupils are equal, round, and reactive.  Visual fields  are intact.  Extraocular movements are full at this time and I see no  nystagmus.  Facial muscle strength is symmetric.  Tongue is midline.  Uvula midline.  MOTOR:  Normal strength involving the left upper extremity and the legs.  The right is weak with 4/5 due to shoulder pain.  Bulk and tone is  normal throughout.  COORDINATION:  No significant dysmetria tremors; past pointing reflexes  are preserved.  Sensation normal to light touch and temperature.   SUPPORTIVE DATA:  Head CT scans reviewed in person and shows marked  cerebral atrophy.  There is also some atrophy involved in the cerebrum  but mild to moderate.  The Tegretol on admission is 17.4 and is now down  to 12.3 as this medication was held overnight.  The Depakote on  admission is 100 and this is also down to 55.8 as this medication was  also held.  Sodium 130, potassium 4.1, chloride 102, BUN 17, creatinine  1.0, glucose 101.  Urinalysis negative.  Urine drug screen  negative.  CBC unremarkable.   IMPRESSION:  1. Carbamazepine toxicity, presented with altered mental status, gait      ataxia, dizziness.  This has improved with the discontinuation of      the medications.  2. Epilepsy, intractable.   RECOMMENDATIONS:  I think we should go ahead and restart the patient  back on her medicines to overt the risk of withdrawal seizures which can  lead to multiple seizures.  I have instructed the patient to have her  mother come in which we did contact by phone, and bring her medications  in.  We will use the patient's medicines and start Depakote and continue  her current dose of 500 mg in the morning and 1000 mg in the night time.  We will also restart the Carbatrol but reduce dose to 300 mg b.i.d.  Plan was discussed at length with the patient and her family.   Thanks for this consultation.      Kofi A. Gerilyn Pilgrim, M.D.  Electronically Signed     KAD/MEDQ  D:  07/01/2008  T:  07/02/2008  Job:  [id number]

## 2010-10-16 NOTE — Consult Note (Signed)
NAME:  Maria [name], Maria [name] NO.:  [id number]   MEDICAL RECORD NO.:  [id number]          [name] TYPE:  EMS   LOCATION:  MAJO                         FACILITY:  [facility]   PHYSICIAN:  Pramod P. Pearlean Brownie, MD    DATE OF BIRTH:  11-Feb-1961   DATE OF CONSULTATION:  DATE OF DISCHARGE:                                 CONSULTATION   REASON FOR REFERRAL:  Abnormal behavior.   HISTORY OF PRESENT ILLNESS:  Maria [name] is a 50 year old Caucasian lady  with mild mental retardation and known lifelong seizure disorder.  She  sees Dr. Anne Hahn in our practice.  Maria [name]'s mother called  Maria  office today saying that she has had bizarre behavior.  She was found  wandering aimlessly in [place] middle [place] wearing only  her night gown.  She, after being brought home, Maria mother went in to  get some food and she found her to be walking towards Maria pool and she  just walked into Maria pool.  She has been confused and not her usual self  since then.  Maria [name] has had seizures, presently  well controlled;  last seizure was less than a year ago and Maria seizures are typically  generalized tonic clonic followed by sleepiness and tiredness and Maria  [name]'s present behavior does not seem to be typical postictal as per  Maria mother.  She has not had any recent cough, cold, diarrhea, fever,  illness, or headache.  She is fairly functional despite her mental  retardation, lives alone, takes her own medicines.   PAST MEDICAL HISTORY:  1. Mental retardation.  2. Seizure disorder.  3. Gastroesophageal reflux disease.   HOME MEDICATIONS:  1. Gabitril 300 mg in Maria morning and 600 mg at night.  2. Omeprazole 20 mg twice daily.  3. Depakote ER 1500 mg daily.   SOCIAL HISTORY:  Maria [name] lives alone.  She does smoke but does not  drink.   REVIEW OF SYSTEMS:  Not significant for any fever, chest pain, cough,  diarrhea, or other illness.   PHYSICAL EXAMINATION:  GENERAL:  Physical  exam reveals a young Caucasian  lady who is not in distress.  VITAL SIGNS:  She is afebrile, with a pulse rate of 72 per minute,  regular.  Respiratory rate 16 per minute.  Blood pressure 180/68.  Saturations 100% on room air.  Distal pulses are well felt.  HEAD:  Nontraumatic.  NECK:  Supple without bruit.  ENT EXAM:  Unremarkable.  CARDIAC EXAM:  No murmur or gallop.  LUNGS:  Clear to auscultation.  ABDOMEN:  Soft, nontender.  NEUROLOGICAL EXAM:  Maria [name] is awake, alert, oriented to time,  place, and person.  There is no aphasia, apraxia, or dysarthria.  Pupils  are 4  mm equal, reactive.  Eye movements are full range without  nystagmus.  She is slow to respond to questions.  She is disoriented to  time.  She is oriented to person and place.  She follows one and two-  step commands.  She has diminished attention and recall.  Motor system  exam reveals no upper extremity drift, symmetric strength, tone,  reflexes, coordination, and sensation.  She walks with a slow steady  gait.   LABORATORY DATA:  Data reviewed.  Basic metabolic panel is normal.  UA  shows 3-6 WBCs with trace leukocyte esterase and bacteria.  Urine drug  screen is negative.  Valproic acid level is low at 26.4.  CT scan of Maria  head shows no acute abnormality.   IMPRESSION:  A 50 year old lady with sudden onset of bizarre, abnormal  behavior of undetermined etiology.  Certainly, with low Depakote levels,  seizure, postictal behavior, or ictal stupor is a possibility but  unlikely given Maria fact that this is not a typical behavior following  her previous seizures.  She does have a urinary tract infection,  possible low-grade urinary tract infection which may perhaps contribute.  Maria [name] being oriented to medical service for further evaluation.  I  would recommend loading her with 1 gram of intravenous Depacon now and  continuing her home dose of 1500 milligrams daily.  Check carbamazepine  level and call me  with Maria results.  Continue her home dose of  carbamazepine for now.  Check ammonia levels, liver function test, urine  cultures.  I will be happy to follow Maria [name] in consults.  Kindly  call for questions.           ______________________________  Sunny Schlein. Pearlean Brownie, MD     PPS/MEDQ  D:  01/07/2007  T:  01/08/2007  Job:  [id number]

## 2010-10-16 NOTE — Discharge Summary (Signed)
NAME:  Maria [name], Maria [name] NO.:  [id number]   MEDICAL RECORD NO.:  [id number]          PATIENT TYPE:  INP   LOCATION:  A313                          FACILITY:  [facility]   PHYSICIAN:  Osvaldo Shipper, MD     DATE OF BIRTH:  1960/11/29   DATE OF ADMISSION:  06/30/2008  DATE OF DISCHARGE:  01/29/2010LH                               DISCHARGE SUMMARY   PMD:  Dr. Nobie Putnam.   NEUROLOGIST:  Dr. Anne Hahn in [place].   During this hospitalization the patient was seen by Dr. Gerilyn Pilgrim.   DIAGNOSES AT THE TIME OF DISCHARGE:  1. Carbamazepine toxicity, improved.  2. Right Clavicular Fracture  3. History of seizure disorder, stable.  4. Mental retardation.  5. Depression.   Please review H and P dictated by Dr. Lilian Kapur.   BRIEF HOSPITAL COURSE:  Briefly this is a 50 year old Caucasian female  who has a history of mental retardation, depression who  started  developing ataxia, dizziness, mental status changes.  The patient's  family took her to her PMD's office who for unclear reason sent her to  the [facility] ED.  In the [facility] ED a CT head was done which was  negative for any acute process.  A right shoulder film was done which  showed evidence for minimally displaced fracture of the lateral aspect  of the right clavicle.  Hip film was done which was negative.  CT of the  abdomen and pelvis was done a couple of days ago because of abdominal  pain apart from small volume of pelvic free fluid.  No other significant  abnormalities were noted.   After blood work it was found out that her Tegretol level was 17.6  within normal range being 4-12.  Valproic acid level was 100.  It was  felt that her symptoms were likely secondary to Tegretol.  Tegretol was  held overnight, and this morning is 12.3.  She has been seen by Dr.  Gerilyn Pilgrim who has adjusted the dose of her carbamazepine, and he feels  that the patient can go home.  The patient continues to be stable.  She  is feeling  a little bit drowsy because she was given morphine for her  right shoulder pain.   For the clavicular fracture she is on a sling, but I would have told  them that it is still recommended that they see an orthopedic  specialist.  Her mother will make an appointment with Dr. Romeo Apple when  she reaches home.  I did offer to make this appointment for her here.   She at this time is medically stable for discharge back to home.   DISCHARGE MEDICATIONS:  1. Carbatrol changed to 300 mg b.i.d.  2. Depakote ER continue as before, 500 mg in the morning and 1000 mg      at bedtime.  3. Omeprazole 20 mg 2 times daily.  4. Children's vitamin 1 tablet daily.  5. Calcium with vitamin D daily.  6. Xalatan ophthalmic solution at bedtime.  7. Percocet 5/325 one tablet p.o. q.6 hours p.r.n. for pain, 20  tablets have been prescribed.   FOLLOW-UP:  1. Follow-up for Carbatrol and Depakote level.  Check next week at her      PMD's office.  2. With Dr. Anne Hahn, her regular neurologist, as scheduled before.  3. With Dr. Romeo Apple.  The patient to make the appointment herself.   DIET:  No restrictions.   PHYSICAL ACTIVITY:  As before.   Total time on this discharge encounter 35 minutes.      Osvaldo Shipper, MD  Electronically Signed     GK/MEDQ  D:  07/01/2008  T:  07/01/2008  Job:  [id number]   cc:   Darleen Crocker A. Gerilyn Pilgrim, M.D.  Fax: [phone]   C. Lesia Sago, M.D.  Fax: [phone]   Patrica Duel, M.D.  Fax: [phone]

## 2010-10-19 NOTE — Op Note (Signed)
NAME:  Maria [name], Maria [name]                         ACCOUNT NO.:  [id number]   MEDICAL RECORD NO.:  [id number]                   PATIENT TYPE:  AMB   LOCATION:  DAY                                  FACILITY:  [facility]   PHYSICIAN:  Lionel December, M.D.                 DATE OF BIRTH:  04/18/1961   DATE OF PROCEDURE:  11/12/2002  DATE OF DISCHARGE:                                 OPERATIVE REPORT   PROCEDURE:  Esophagogastroduodenoscopy with esophageal dilatation.   ENDOSCOPIST:  Lionel December, M.D.   INDICATIONS:  Maria [name] is a 50 year old Caucasian female with chronic GERD who  now presents with solid food dysphagia.  She is presently on Nexium 40 mg  b.i.d.  Dose was increased about 2 weeks ago. She is also complaining of  right lower rib cage pain, right flank pain, and right lower quadrant pain.  This pain is felt to be musculoskeletal as she noticed it more when she  moves about.  This will be further evaluated by Dr. Nobie Putnam, unless he  already has done so.   The procedure and risks were reviewed with the patient and informed consent  was obtained.   PREOPERATIVE MEDICATIONS:  Cetacaine spray for oropharyngeal topical  anesthesia, Demerol 20 mg IV and Versed 7 mg IV in divided dose.   INSTRUMENT:  Olympus video system.   FINDINGS:  Procedure performed in endoscopy suite.  The patient's vital  signs and O2 saturation were monitored during the procedure and remained  stable.  The patient was placed in the left lateral recumbent position and  endoscope was passed via the oropharynx without any difficulty into the  esophagus.   ESOPHAGUS:  Mucosa of the esophagus was normal.  There were 2 rings at  distal esophagus just proximal or close to the GE junction, but these are  not felt to be critical.  The GE junction was serrated; and there was a  small sliding hiatal hernia no more than 3 cm in length.   STOMACH:  It was empty and distended very well with insufflation.  The folds  of  the proximal stomach were normal.  Examination of the mucosa reveals a  single prepyloric erosion, otherwise normal.  The angularis, fundus, and  cardia were examined by retroflexing the scope and were normal.   DUODENUM:  Examination of the bulb revealed normal mucosa.  The mucosa and  folds of the second part of the duodenum were normal.  The endoscope was  withdrawn.   Esophagus was dilated by passing 56 [place] Maloney dilator to full  insertion.  As the dilator was withdrawn the endoscope was passed again and  there was a small mucosal disruption or tear at the distal esophagus  extending to the GE junction; but there were 2 more, one at the midesophagus  which was narrow and at least 2 cm long, and a third one was in the  cervical  esophagus.  These areas on prior exam did not appear to be narrowed, but the  lumen did not appear to be compromised.  Pictures were taken for the record.   The endoscope was withdrawn.  The patient tolerated the procedure fairly  well.   FINAL DIAGNOSES:  1. Two distal esophageal rings along with small sliding hiatal hernia.  2. Esophagus was dilated by passing 56 [place] Maloney dilator resulting in     mucosal disruption at 3 levels, as discussed above.  3. A single prepyloric erosion.   RECOMMENDATIONS:  1. She will continue antireflux measures and Anzemet 40 mg b.i.d.  2. She will stay on full liquid diet for 48 hours.  3. We will give her a prescription for hydrocodone liquid, 5 mg to be taken     q.6h. p.r.n.; will give her 20 doses.  4. She will resume her anticonvulsants as before.  5. Will see her in follow up in 4 weeks from now.                                               Lionel December, M.D.    NR/MEDQ  D:  11/12/2002  T:  11/12/2002  Job:  [id number]   cc:   Patrica Duel, M.D.  [address], [address]  [place]  [place] [address]  Fax: [phone]

## 2010-10-19 NOTE — Op Note (Signed)
NAME:  Maria [name], Maria [name]               ACCOUNT NO.:  [id number]   MEDICAL RECORD NO.:  [id number]          PATIENT TYPE:  AMB   LOCATION:  DAY                           FACILITY:  [facility]   PHYSICIAN:  Lionel December, M.D.    DATE OF BIRTH:  Jul 01, 1960   DATE OF PROCEDURE:  08/24/2004  DATE OF DISCHARGE:                                 OPERATIVE REPORT   PROCEDURE:  Esophagogastroduodenoscopy with esophageal dilation.   INDICATIONS:  Tiare is a 50 year old Caucasian female with chronic GERD who  presents with few weeks history of dysphagia. Her last EGD/ED was in June  2004. She was noted to have two distal esophageal rings which were disrupted  by passing 56-French Maloney dilator, and she also had a small sliding  hiatal hernia. She has done well until recently. She is on Nexium for  chronic GERD.   Procedure risks were reviewed the patient, and informed consent was  obtained.   PREMEDICATION:  Cetacaine spray for pharyngeal topical anesthesia, Demerol  20 mg IV, Versed 7 mg IV in divided doses.   FINDINGS:  Procedure performed in endoscopy suite. The patient's vital signs  and O2 saturation were monitored during procedure and remained stable. The  patient was placed in left lateral position, and Olympus videoscope was  passed oropharynx without any difficulty into esophagus.   Esophagus. Mucosa of the esophagus was normal. There was inconspicuous ring  just proximal to GE junction which was at 38 cm and hiatus was at 40.   Stomach. It was empty and distended very well insufflation. Folds of  proximal stomach were normal. Examination of mucosa at body, antrum, pyloric  channel as well as angularis, fundus and cardia was normal. Please note that  there was no evidence of gastric erosions as on previous examination, and  she has been treated for H pylori gastritis.   Duodenum. Bulbar mucosa was normal. Scope was passed second part of duodenum  where mucosa and folds were normal.  Endoscope was withdrawn.   Esophagus was dilated by passing 56-French Maloney dilator to full  insertion. As the dilator was withdrawn, endoscope was passed again and  there was a ____________ shaped mucosal tear just proximal to GE junction.  Pictures taken for the record. Endoscope was withdrawn. The patient  tolerated the procedure well.   FINAL DIAGNOSES:  1.  Distal esophageal ring just proximal to GE junction which was disrupted      by passing 56-French Maloney dilator.  2.  Small sliding hiatal hernia.  3.  Normal examination of the stomach and first second part of the duodenum.   RECOMMENDATIONS:  She should continue entire reflux measures and Nexium 40  mg p.o. q.a.m.      NR/MEDQ  D:  08/24/2004  T:  08/24/2004  Job:  [id number]   cc:   Patrica Duel, M.D.  [address], [address]  [place]  [place] [address]  Fax: [phone]

## 2010-10-19 NOTE — H&P (Signed)
NAME:  Maria [name], Maria [name]               ACCOUNT NO.:  [id number]   MEDICAL RECORD NO.:  [id number]          PATIENT TYPE:  AMB   LOCATION:                                 FACILITY:   PHYSICIAN:  Lionel December, M.D.    DATE OF BIRTH:  12-04-1960   DATE OF ADMISSION:  DATE OF DISCHARGE:  LH                                HISTORY & PHYSICAL   CHIEF COMPLAINT:  Requesting EGD for esophageal dysphagia.   HISTORY OF PRESENT ILLNESS:  Maria [name] is a 50 year old Caucasian female  who has been followed by Dr. Karilyn Cota for esophageal dysphagia in the past.  She underwent EGD on November 12, 2002 for chronic intermittent dysphagia to  solids and sometimes liquids. She was found to have two distal esophageal  rings along with a small sliding hiatal hernia. Esophagus was dilated with a  56-French Maloney dilator. She also had a single prepyloric erosion. Over  the last two to three months, she notes recurrence of her solid food  dysphagia. She denies any problems with liquids. She also reports anorexia  and some intermittent nausea and vomiting. She has had a flare of her  heartburn and indigestion although she has not been taking Protonix as she  stated that this did not help. She was given Nexium 40 mg daily which did  help some although she has ran out of this medication over the last couple  of months. She was helicobacter pylori positive status post triple drug  therapy. She reports a significant weight loss of about 15 to 20 pounds,  although she has gained some of that weight back at this time. Her mother  notes giving her Ensure and breakfast bars to try and put on some more  calories. Her bowel movements have been normal, soft, and brown. She does  normally have two to four stools per day. Denies any rectal bleeding or  melena.   PAST MEDICAL HISTORY:  1.  EGD with esophageal dilatation by Dr. Karilyn Cota for esophageal dysphagia      and distal esophageal ring on November 12, 2002.  2.  Epilepsy.  3.  Status post hysterectomy for a possible precancerous finding.  4.  Status post tonsillectomy.   CURRENT MEDICATIONS:  1.  _______________ 300 mg 3 tablets daily.  2.  Nexium 40 mg (currently discontinued).  3.  Depakote 500 mg 3 times a day.  4.  Multivitamin daily.  5.  Allegra D p.r.n.  6.  Vitamin E daily.  7.  Ibuprofen 200 mg p.r.n.   ALLERGIES:  CODEINE.   FAMILY HISTORY:  No known family history of colorectal carcinoma, liver or  chronic GI problems.   SOCIAL HISTORY:  [name] is single. She is currently disabled. She lives  alone. She reports a 30-pack-year history of tobacco use. She denies any  alcohol or drug use.   REVIEW OF SYSTEMS:  See HPI. CONSTITUTIONAL:  She denies any fever or  chills. CARDIOVASCULAR:  She denies any chest pain or palpitations.  PULMONARY:  She denies any shortness of breath, dyspnea, cough, or  hemoptysis.  GASTROINTESTINAL:  See HPI.   PHYSICAL EXAMINATION:  VITAL SIGNS:  Weight 105 pounds, blood pressure  100/62, pulse 96.  GENERAL:  Maria [name] is an alert, oriented, pleasant, cooperative, thin,  Caucasian female in no acute distress. She is accompanied by her mother  today.  HEENT:  Sclerae are clear and nonicteric. Conjunctivae pink. Oropharynx pink  and moist without any lesions.  NECK:  Supple without any masses or thyromegaly.  CHEST:  Heart regular rate and rhythm. Normal S1 and S2 without murmurs,  rubs, clicks or gallops. Lungs are clear to auscultation bilaterally.  ABDOMEN:  Positive bowel sounds x4. No bruits auscultated. Soft, flat,  nontender, nondistended, without palpable mass or hepatosplenomegaly. No  rebound tenderness or guarding.  EXTREMITIES:  Without edema.  SKIN:  Pink, warm and dry without any rash or jaundice.   IMPRESSION:  Maria [name] is a 50 year old Caucasian female with chronic  intermittent solid food dysphagia which has worsened over the last two  months. She is status post dilatation of double  esophageal ring in June  2004. Most likely, she has had recurrence of her esophageal rings given her  refractory GERD symptoms. She has noted a significant amount of weight loss,  and it appears this is due to decreased p.o. intake, and she may need  further evaluation depending on how her weight progresses if she is able to  eat after dilatation. She should also resume her PPI therapy at this time,  and the importance of this was stressed with both patient and mother.   RECOMMENDATIONS:  1.  Will scheduled EGD with possible esophageal dilatation with Dr. Karilyn Cota.      I have discussed the procedure including risks and benefits which      include but are not limited to bleeding, infection, perforation, and      drug reaction. Consent will be obtained. Both patient and mother agree      with this plan.  2.  She is given a prescription for Nexium 40 mg daily #30 with 3 refills.  3.  Further recommendations pending procedure and further work of weight      loss pending EGD findings.      KC/MEDQ  D:  08/14/2004  T:  08/14/2004  Job:  [id number]   cc:   Patrica Duel, M.D.  [address], [address]  [place]  [place] [address]  Fax: [phone]

## 2010-10-19 NOTE — Consult Note (Signed)
NAME:  Maria [name], Maria [name] NO.:  [id number]   MEDICAL RECORD NO.:  [id number]                  PATIENT TYPE:   LOCATION:                                       FACILITY:   PHYSICIAN:  Lionel December, M.D.                 DATE OF BIRTH:  02/14/1951   DATE OF CONSULTATION:  11/09/2002  DATE OF DISCHARGE:                                   CONSULTATION   REASON FOR CONSULTATION:  Dysphagia, gastroesophageal reflux disease.   HISTORY OF PRESENT ILLNESS:  Maria [name] is a 50 year old Caucasian female patient  of Dr. Patrica Duel who presents today for further evaluation of the above  stated symptoms.  Recently she has developed pain in her retrosternal chest  region which she feels may be an esophageal spasm.  She notes it especially  when she is very anxious.  She has intermittent dysphagia to solids and  sometimes liquids.  She states she has had a history of similar symptoms  dating back to age 75.  She has never had a workup however.  Symptoms tend  to come and go every few months.  She has had some nausea but no vomiting.  She gives a two year history of epigastric pain which is not improved on  Nexium.  She complains of nocturnal heartburn type symptoms for which she  takes Rolaids or Tums p.r.n.  Denies any constipation or diarrhea, melena or  rectal bleeding.  Denies any shortness of breath or diaphoresis related with  chest pain.  She had a normal EKG recently in Dr. Loraine Leriche Cresenzo's office.   CURRENT MEDICATIONS:  1. Carbatrol 300 mg three daily.  2. Depakote 500 mg three daily.  3. Lasix 40 mg b.i.d.  4. Multivitamin daily.  5. Nexium increased to b.i.d. three days ago.   ALLERGIES:  CODEINE.   PAST MEDICAL HISTORY:  1. Epilepsy.  2. Status post hysterectomy for a questionable precancerous finding.  3. Tonsillectomy.   FAMILY HISTORY:  Paternal uncle had prostate cancer.  Maternal aunt had  breast cancer.  No family history of colorectal cancer  or chronic GI  illnesses.   SOCIAL HISTORY:  She is single with no children.  She is disabled.  She  smokes one pack of cigarettes daily.  Denies any alcohol use.   REVIEW OF SYSTEMS:  Please see HPI for GI.  GENERAL:  Denies any weight  loss.  Please see HPI for cardiopulmonary.   PHYSICAL EXAMINATION:  VITAL SIGNS:  Weight 117, height 5 foot 6.  Temp  97.9, blood pressure 100/70, pulse 90.  GENERAL:  Pleasant, thin, Caucasian female no acute distress.  She is  accompanied by her mother.  SKIN:  Warm and dry.  No jaundice.  HEENT:  Conjunctivae are pink.  Sclerae are nonicteric.  Oropharyngeal moist  and pink.  No lesions, erythema or exudate.  No lymphadenopathy or  thyromegaly.  CHEST:  Lungs are clear to auscultation.  CARDIAC:  Reveals a regular, rate and rhythm, normal S1, S2.  No murmurs,  rubs or gallops.  ABDOMEN:  Positive bowel sounds.  Soft, nondistended.  She has moderate  tenderness in the epigastric region to deep palpation.  No organomegaly or  masses.  EXTREMITIES:  No edema.   IMPRESSION:  Maria [name] is a 50 year old female with chronic intermittent  dysphagia to solids and sometimes liquids.  She also complains of  retrosternal chest pain brought on by anxiety.  She is having typical reflux  symptoms especially at night.  She complains of epigastric pain.  Given  numerous symptoms I have recommended upper endoscopy for further evaluation.  We need to make sure that she does not have an esophageal stricture, web or  ring as well as peptic ulcer disease.  She may be having esophageal spasm.   PLAN:  1. EGD with dilatation.  2. Trial of Levsin sublingual #30, one sublingual q.i.d. p.r.n. or q.a.c.     q.h.s. zero refills.  3. Further recommendations to follow.   I would like to thank Dr. Patrica Duel for allowing [place] to take part in the  care of this patient.       Tana Coast, P.A.                        Lionel December, M.D.    LL/MEDQ  D:  11/09/2002  T:   11/09/2002  Job:  [id number]   cc:   Patrica Duel, M.D.  [address]  [place]  [place] [address]  Fax: [phone]

## 2010-11-28 ENCOUNTER — Ambulatory Visit (HOSPITAL_COMMUNITY)
Admission: RE | Admit: 2010-11-28 | Discharge: 2010-11-28 | Disposition: A | Discharge status: Home / Self Care | Payer: Medicare Other | Source: Ambulatory Visit | Attending: Internal Medicine | Admitting: Internal Medicine

## 2010-11-28 ENCOUNTER — Other Ambulatory Visit (HOSPITAL_COMMUNITY): Payer: Self-pay | Admitting: Internal Medicine

## 2010-11-28 DIAGNOSIS — Z8701 Personal history of pneumonia (recurrent): Secondary | ICD-10-CM | POA: Insufficient documentation

## 2010-11-28 DIAGNOSIS — J069 Acute upper respiratory infection, unspecified: Secondary | ICD-10-CM

## 2010-11-28 DIAGNOSIS — R509 Fever, unspecified: Secondary | ICD-10-CM | POA: Insufficient documentation

## 2011-02-22 LAB — CBC
HCT: 36.3
Hemoglobin: 12.6
RBC: 3.75 — ABNORMAL LOW

## 2011-02-22 LAB — FERRITIN: Ferritin: 154 (ref 10–291)

## 2011-02-22 LAB — DIFFERENTIAL
Lymphocytes Relative: 20
Lymphs Abs: 2
Monocytes Absolute: 0.4
Monocytes Relative: 5
Neutro Abs: 7.3

## 2011-02-22 LAB — VITAMIN B12: Vitamin B-12: 723 (ref 211–911)

## 2011-02-22 LAB — IRON AND TIBC
Saturation Ratios: 35
UIBC: 202

## 2011-02-22 LAB — ANA: Anti Nuclear Antibody(ANA): NEGATIVE

## 2011-02-22 LAB — FOLATE: Folate: >20

## 2011-02-22 LAB — RHEUMATOID FACTOR: Rhuematoid fact SerPl-aCnc: <20

## 2011-02-25 LAB — CBC
HCT: 35.9 — ABNORMAL LOW
Platelets: 120 — ABNORMAL LOW
RDW: 13.1

## 2011-02-26 LAB — CBC
HCT: 33.3 — ABNORMAL LOW
MCV: 96.8
RBC: 3.45 — ABNORMAL LOW
WBC: 6

## 2011-03-01 LAB — CBC
HCT: 36
Hemoglobin: 12.3
MCV: 96.8
Platelets: 146 — ABNORMAL LOW
RDW: 13.3

## 2011-03-04 LAB — CBC
HCT: 37.1
MCHC: 34.5
Platelets: 154
RDW: 13.5

## 2011-03-12 ENCOUNTER — Encounter (INDEPENDENT_AMBULATORY_CARE_PROVIDER_SITE_OTHER): Payer: Self-pay | Admitting: *Deleted

## 2011-03-18 LAB — I-STAT 8, (EC8 V) (CONVERTED LAB)
Acid-base deficit: 1
BUN: 17
Bicarbonate: 25 — ABNORMAL HIGH
Chloride: 107
Glucose, Bld: 99
HCT: 36
Hemoglobin: 12.2
Operator id: 272551
Potassium: 4.3
Sodium: 140
TCO2: 26
pCO2, Ven: 44.9 — ABNORMAL LOW
pH, Ven: 7.353 — ABNORMAL HIGH

## 2011-03-18 LAB — HEPATIC FUNCTION PANEL
AST: 21
Albumin: 3.8
Bilirubin, Direct: 0.1
Total Bilirubin: 0.5

## 2011-03-18 LAB — URINALYSIS, ROUTINE W REFLEX MICROSCOPIC
Glucose, UA: NEGATIVE
Ketones, ur: 15 — AB
Nitrite: NEGATIVE
Specific Gravity, Urine: 1.011
pH: 6

## 2011-03-18 LAB — URINE CULTURE: Colony Count: 50000

## 2011-03-18 LAB — CBC
Platelets: 156
RDW: 13.5
WBC: 7.5

## 2011-03-18 LAB — URINE MICROSCOPIC-ADD ON

## 2011-03-18 LAB — POCT I-STAT CREATININE
Creatinine, Ser: 0.9
Operator id: 272551

## 2011-03-18 LAB — AMMONIA: Ammonia: 21

## 2011-03-18 LAB — DIFFERENTIAL
Basophils Absolute: 0
Eosinophils Relative: 0
Lymphocytes Relative: 22
Lymphs Abs: 1.7
Neutro Abs: 5.4
Neutrophils Relative %: 71

## 2011-03-18 LAB — RAPID URINE DRUG SCREEN, HOSP PERFORMED
Barbiturates: NOT DETECTED
Benzodiazepines: NOT DETECTED

## 2011-03-18 LAB — CARBAMAZEPINE LEVEL, TOTAL: Carbamazepine Lvl: 6.3

## 2011-03-20 ENCOUNTER — Ambulatory Visit (INDEPENDENT_AMBULATORY_CARE_PROVIDER_SITE_OTHER): Payer: Medicare Other | Admitting: Internal Medicine

## 2011-03-21 ENCOUNTER — Encounter (INDEPENDENT_AMBULATORY_CARE_PROVIDER_SITE_OTHER): Payer: Self-pay | Admitting: Internal Medicine

## 2011-03-21 ENCOUNTER — Ambulatory Visit (INDEPENDENT_AMBULATORY_CARE_PROVIDER_SITE_OTHER): Payer: Medicare Other | Admitting: Internal Medicine

## 2011-03-21 VITALS — BP 90/58 | HR 72 | Temp 98.4°F | Ht 66.0 in | Wt 147.2 lb

## 2011-03-21 DIAGNOSIS — K219 Gastro-esophageal reflux disease without esophagitis: Secondary | ICD-10-CM

## 2011-03-21 DIAGNOSIS — R569 Unspecified convulsions: Secondary | ICD-10-CM

## 2011-03-21 NOTE — Progress Notes (Signed)
Subjective:     Patient ID: Maria [name], female   DOB: 1961/02/17, 50 y.o.   MRN: [id number]  HPI Maria [name] is a 50 yr old female presenting for f/u of GERD.  Her acid reflux is controlled with the Dexilant.  Her appetite is good. She has gained about 20 pounds since her last visit. Her mother says her weight gain is from the risperidone which has now been discontinued.  She denies any abdominal pain. She is having a BM about once a day.  No melena or bright red rectal bleeding. She does very little exercising. She has not had a seizure since April or May when he medications were changed. Review of Systems see hpi Current Outpatient Prescriptions  Medication Sig Dispense Refill  . beta carotene w/minerals (OCUVITE) tablet Take 1 tablet by mouth daily.        . calcium carbonate (TUMS - DOSED IN MG ELEMENTAL CALCIUM) 500 MG chewable tablet Chew 1 tablet by mouth daily.        Marland Kitchen dexlansoprazole (DEXILANT) 60 MG capsule Take 60 mg by mouth daily.        . diazepam (VALIUM) 5 MG tablet Take 5 mg by mouth every 6 (six) hours as needed.        . divalproex (DEPAKOTE) 500 MG DR tablet Take 500 mg by mouth. One in am and 2 hs       . estradiol (VIVELLE-DOT) 0.0375 MG/24HR Place 1 patch onto the skin 2 (two) times a week.        Marland Kitchen ibuprofen (ADVIL,MOTRIN) 200 MG tablet Take 200 mg by mouth every 6 (six) hours as needed.        . lamoTRIgine (LAMICTAL) 25 MG tablet Take 25 mg by mouth daily.        Marland Kitchen levocetirizine (XYZAL) 5 MG tablet Take 5 mg by mouth every evening.         Past Surgical History  Procedure Date  . Total abdominal hysterectomy    Past Medical History  Diagnosis Date  . Seizures   . GERD (gastroesophageal reflux disease)    History   Social History Narrative  . No narrative on file   History   Social History  . Marital Status: Single    Spouse Name: N/A    Number of Children: N/A  . Years of Education: N/A   Occupational History  . Not on file.   Social History Main  Topics  . Smoking status: Never Smoker   . Smokeless tobacco: Not on file  . Alcohol Use: No  . Drug Use: No  . Sexually Active: Not on file   Other Topics Concern  . Not on file   Social History Narrative  . No narrative on file   .fam Family Status  Relation Status Death Age  . Mother Alive     good health  . Father Deceased     MI  . Sister Alive     good health   Allergies  Allergen Reactions  . Codeine     REACTION: nausea, vomiting       Objective:   Physical Exam Filed Vitals:   03/21/11 1458  BP: 90/58  Pulse: 72  Temp: 98.4 F (36.9 C)  Height: 5\' 6"  (1.676 m)  Weight: 147 lb 3.2 oz (66.769 kg)   Alert and oriented. Skin warm and dry. Oral mucosa is moist. Natural teeth in good condition. Sclera anicteric, conjunctivae is pink. Thyroid not enlarged. No  cervical lymphadenopathy. Lungs clear. Heart regular rate and rhythm.  Abdomen is soft. Bowel sounds are positive. No hepatomegaly. No abdominal masses felt. No tenderness.  No edema to lower extremities. Patient is alert and oriented.      Assessment:    GERD which is well controlled at this time.   Plan:    She will follow up in one year. Continue the Dexilant.  Rx for Dexilant  #90  with 4 refills.

## 2011-03-21 NOTE — Patient Instructions (Signed)
Continue Dexilant. OV in1 yr.

## 2011-05-14 ENCOUNTER — Ambulatory Visit (HOSPITAL_COMMUNITY): Payer: Medicare Other | Admitting: Oncology

## 2011-05-20 ENCOUNTER — Ambulatory Visit (HOSPITAL_COMMUNITY): Payer: Medicare Other | Admitting: Oncology

## 2011-05-23 ENCOUNTER — Encounter (HOSPITAL_COMMUNITY): Payer: Medicare Other | Attending: Oncology

## 2011-05-23 ENCOUNTER — Encounter (HOSPITAL_COMMUNITY): Payer: Self-pay

## 2011-05-23 VITALS — BP 120/76 | HR 72 | Temp 97.7°F | Ht 64.37 in | Wt 149.2 lb

## 2011-05-23 DIAGNOSIS — L659 Nonscarring hair loss, unspecified: Secondary | ICD-10-CM | POA: Insufficient documentation

## 2011-05-23 DIAGNOSIS — R5381 Other malaise: Secondary | ICD-10-CM | POA: Insufficient documentation

## 2011-05-23 DIAGNOSIS — G40909 Epilepsy, unspecified, not intractable, without status epilepticus: Secondary | ICD-10-CM | POA: Insufficient documentation

## 2011-05-23 DIAGNOSIS — R21 Rash and other nonspecific skin eruption: Secondary | ICD-10-CM | POA: Insufficient documentation

## 2011-05-23 DIAGNOSIS — R1011 Right upper quadrant pain: Secondary | ICD-10-CM

## 2011-05-23 DIAGNOSIS — D696 Thrombocytopenia, unspecified: Secondary | ICD-10-CM | POA: Insufficient documentation

## 2011-05-23 DIAGNOSIS — R5383 Other fatigue: Secondary | ICD-10-CM | POA: Insufficient documentation

## 2011-05-23 DIAGNOSIS — R718 Other abnormality of red blood cells: Secondary | ICD-10-CM | POA: Insufficient documentation

## 2011-05-23 DIAGNOSIS — Z79899 Other long term (current) drug therapy: Secondary | ICD-10-CM | POA: Insufficient documentation

## 2011-05-23 LAB — CBC
Hemoglobin: 12.4 g/dL (ref 12.0–15.0)
MCH: 33.4 pg (ref 26.0–34.0)
MCV: 99.2 fL (ref 78.0–100.0)
Platelets: 90 10*3/uL — ABNORMAL LOW (ref 150–400)
RBC: 3.71 MIL/uL — ABNORMAL LOW (ref 3.87–5.11)
WBC: 5.4 10*3/uL (ref 4.0–10.5)

## 2011-05-23 LAB — COMPREHENSIVE METABOLIC PANEL
ALT: 23 U/L (ref 0–35)
AST: 27 U/L (ref 0–37)
CO2: 28 mEq/L (ref 19–32)
Chloride: 103 mEq/L (ref 96–112)
GFR calc Af Amer: 82 mL/min — ABNORMAL LOW (ref >90)
GFR calc non Af Amer: 71 mL/min — ABNORMAL LOW (ref >90)
Glucose, Bld: 98 mg/dL (ref 70–99)
Sodium: 138 mEq/L (ref 135–145)
Total Bilirubin: 0.3 mg/dL (ref 0.3–1.2)

## 2011-05-23 NOTE — Patient Instructions (Signed)
[facility]  Discharge Instructions  RECOMMENDATIONS MADE BY THE CONSULTANT AND ANY TEST RESULTS WILL BE SENT TO YOUR REFERRING DOCTOR.   EXAM FINDINGS BY MD TODAY AND SIGNS AND SYMPTOMS TO REPORT TO CLINIC OR PRIMARY MD: We will do some lab work today. We will call you if there are any abnormal results. We will also get you scheduled for an Ultrasound. We will see you back in clinic after the Jaymeson Mengel year.    I acknowledge that I have been informed and understand all the instructions given to me and received a copy. I do not have any more questions at this time, but understand that I may call the Specialty Clinic at [facility] at [phone] during business hours should I have any further questions or need assistance in obtaining follow-up care.    __________________________________________  _____________  __________ Signature of Patient or Authorized Representative            Date                   Time    __________________________________________ Nurse's Signature

## 2011-05-23 NOTE — Progress Notes (Signed)
CC:   Kirk Ruths, M.D.  IDENTIFYING STATEMENT:  The patient is a 50 year old woman seen at request of Dr. Regino Schultze with abnormal labs.  HISTORY OF PRESENT ILLNESS:  The patient has a past medical history significant for seizures.  She is on a number of antiseizure medications.  She is here with her mother who gives most of the history. Her main complaint is alopecia/thinning of the hair.  She also complains of lack of energy and was and given IV iron at this clinic a year or 2 ago for anemia.  Recent lab work notes the following:  On 04/26/2011, white cell count 4.7, hemoglobin 13, hematocrit 40, platelets 95, MCV 101.  In comparison, CBC obtained April 07, 2010 noted a white cell count of 5, hemoglobin 11.6, hematocrit 33.5, platelets 145.  Her history is negative for bruising or bleeding.  The patient's mother does note a rash on the abdomen.  She also notes that this rash has been chronic. It was present 2 years ago when she was seen here in the clinic.  Other concern is ongoing weight gain.  She also describes hot flashes.  She also notes that a recent TSH was unremarkable.  The patient also has complaints of nausea for which she takes Dexilant.  She also describes intermittent right upper quadrant discomfort.  She denies diarrhea.  PAST MEDICAL HISTORY: 1. History of seizures. 2. History of GERD. 3. Status post total abdominal hysterectomy. 4. Status post repair of right clavicle.  ALLERGIES:  CODEINE.  MEDICATIONS: 1. Calcium carbonate 500 mg daily. 2. Dexilant 60 mg daily. 3. Valium 5 mg q.6 h. p.r.n. 4. Lamictal 25 mg daily. 5. Xyzal 5 mg nightly. 6. Depakote 500 mg daily. 7. Estraderm patch 2 times weekly.  SOCIAL HISTORY:  The patient is single.  She has a past medical history for tobacco use.  Denies alcohol use.  She currently lives with her mother.  FAMILY HISTORY:  Negative for oncologic or hematologic malignancies.  REVIEW OF SYSTEMS:  Denies  fever, chills, night sweats, anorexia, weight loss.  Has fatigue but is able to carry on with most activities of daily living.  GI:  She has nausea but no vomiting.  She has right upper quadrant pain which at its worse is 3/10, but she is not taking medications for this.  It is exacerbated by eating.  Denies rectal bleeding.  She is not prone to constipation or diarrhea.  She admits to weight gain.  Cardiovascular:  Denies chest pain, PND, orthopnea, or ankle swelling.  Respiratory:  Denies cough, hemoptysis, wheezes, or shortness of breath.  Skin:  Notes a chronic red rash on the abdomen. Denies bruising.  Neurologic:  Denies headaches, vision changes, or extremity weakness.  Lymph nodes:  No palpable adenopathy noted.  The rest of the review of systems is negative.  PHYSICAL EXAM:  General:  Patient is alert and oriented x3.  Vital Signs:  Pulse 72, blood pressure 120/76, temperature 97.7, respirations 16, weight 159 pounds.  HEENT:  Head is atraumatic, normocephalic. Sclerae anicteric.  Extraocular muscles intact.  Pupils equal, round, and reactive to light.  Mouth moist.  Neck:  Supple.  Chest:  Clear. CVS:  Unremarkable.  Abdomen:  Soft.  No areas of tenderness.  No hepatomegaly.  Bowel sounds are present.  Skin:  Pinpoint red lesions on the abdomen that do not blanch on palpation.  Joints:  No swelling or effusion.  Lymph nodes:  No palpable cervical, axillary, or inguinal  adenopathy.  CNS:  Nonfocal.  IMPRESSION AND PLAN:  The patient is a 50 year old woman.  Most recent lab work notes a platelet count of 95,000.  The patient does not appear to have any overt evidence of bleeding.  Her white blood cell count and hemoglobin appear adequate.  She is on a number of seizure medications, and these may have a role, especially a role with undergoing alopecia and fatigue.  In addition, she has microcytosis.  Her thyroid function tests are unremarkable.  Again, this may be related to  seizure medication.  I would like to do additional lab work specifically to look at her B12 level and serum folate levels.  We will also repeat a CBC, and review a smear.  She has ongoing right upper quadrant pain.  In the setting of thrombocytopenia, it would be reasonable to obtain a limited abdominal ultrasound of the liver and spleen.  The patient's mother is here who had a number of questions which were all answered.  We spent more than half the time reviewing labs and discussing potential diagnoses.  The patient is scheduled to follow up to discuss results.    ______________________________ Laurice Record, M.D. LIO/MEDQ  D:  05/23/2011  T:  05/23/2011  Job:  [id number]

## 2011-05-23 NOTE — Progress Notes (Signed)
This office note has been dictated.

## 2011-05-30 ENCOUNTER — Ambulatory Visit (HOSPITAL_COMMUNITY)
Admission: RE | Admit: 2011-05-30 | Discharge: 2011-05-30 | Disposition: A | Discharge status: Home / Self Care | Payer: Medicare Other | Source: Ambulatory Visit | Attending: Hematology and Oncology | Admitting: Hematology and Oncology

## 2011-05-30 DIAGNOSIS — R1011 Right upper quadrant pain: Secondary | ICD-10-CM | POA: Insufficient documentation

## 2011-05-30 DIAGNOSIS — D696 Thrombocytopenia, unspecified: Secondary | ICD-10-CM | POA: Insufficient documentation

## 2011-06-06 ENCOUNTER — Encounter (HOSPITAL_COMMUNITY): Payer: Medicare Other | Attending: Oncology | Admitting: Oncology

## 2011-06-06 ENCOUNTER — Encounter (HOSPITAL_COMMUNITY): Payer: Self-pay | Admitting: Oncology

## 2011-06-06 VITALS — BP 109/72 | HR 81 | Temp 97.4°F | Wt 151.4 lb

## 2011-06-06 DIAGNOSIS — D72819 Decreased white blood cell count, unspecified: Secondary | ICD-10-CM

## 2011-06-06 DIAGNOSIS — R569 Unspecified convulsions: Secondary | ICD-10-CM

## 2011-06-06 DIAGNOSIS — D696 Thrombocytopenia, unspecified: Secondary | ICD-10-CM | POA: Insufficient documentation

## 2011-06-06 NOTE — Progress Notes (Signed)
Maria Ruths, MD [address] [place] [address]  1. Thrombocytopenia  CBC, CBC, CBC, Differential, Basic metabolic panel, Ambulatory referral to Neurology  2. Seizures  Ambulatory referral to Neurology    CURRENT THERAPY: Observation  INTERVAL HISTORY: Maria [name] 51 y.o. female returns for  regular  visit for followup of thrombocytopenia.  We spent time going over her lab work. I personally reviewed and went over laboratory results with the patient. Her lab reveals stability of her platelets and slight improvement in her WBC count and Hgb.  She understands that her platelet count of 90,000 is stable and is a safe value.   I personally reviewed and went over radiographic studies with the patient.  No signs of splenomegaly or liver disease via U/S.    I reported to the patient and her mother that her anti-seizure medication may be the culprit of her abnormal lab work.  On discussion with the patient and her mother, they request a second opinion regarding the patient's anti seizure medication regimen with Dr. Anne Hahn.  They also would like for him to give them an opinion regarding the association of her anti-seizure medication and blood counts.    The patient denies any complaints today.  She denies any blood in her stool, black tarry stool, hematuria, rectal bleeding, gingival bleeding, easy bruisability, and spontaneous bleeding.  Complete hematologic review of systems is negative.   Past Medical History  Diagnosis Date  . Seizures   . GERD (gastroesophageal reflux disease)   . Mental retardation     "Mild"    has CLOSED FRACTURE OF ACROMIAL END OF CLAVICLE; Seizures; GERD (gastroesophageal reflux disease); and Thrombocytopenia on her problem list.     is allergic to codeine.  Ms. Fedorko does not currently have medications on file.  Past Surgical History  Procedure Date  . Total abdominal hysterectomy   . Tonsillectomy and adenoidectomy       Denies any headaches, dizziness, double vision, fevers, chills, night sweats, nausea, vomiting, diarrhea, constipation, chest pain, heart palpitations, shortness of breath, blood in stool, black tarry stool, urinary pain, urinary burning, urinary frequency, hematuria.   PHYSICAL EXAMINATION  ECOG PERFORMANCE STATUS: 0 - Asymptomatic  Filed Vitals:   06/06/11 1201  BP: 109/72  Pulse: 81  Temp: 97.4 F (36.3 C)    GENERAL:alert, no distress, well nourished, well developed, comfortable, cooperative, smiling and mother is very active in discussion and questioning.  SKIN: skin color, texture, turgor are normal HEAD: Normocephalic EYES: normal EARS: External ears normal OROPHARYNX:mucous membranes are moist  NECK: supple, trachea midline LYMPH:  not examined BREAST:not examined LUNGS: clear to auscultation  HEART: regular rate & rhythm, no murmurs, no gallops, S1 normal and S2 normal ABDOMEN:abdomen soft, non-tender and normal bowel sounds BACK: Back symmetric, no curvature., No CVA tenderness EXTREMITIES:less then 2 second capillary refill, no joint deformities, effusion, or inflammation, no edema, no skin discoloration, no clubbing, no cyanosis  NEURO: no focal motor/sensory deficits   LABORATORY DATA: CBC    Component Value Date/Time   WBC 5.4 05/23/2011 1323   RBC 3.71* 05/23/2011 1323   HGB 12.4 05/23/2011 1323   HCT 36.8 05/23/2011 1323   PLT 90* 05/23/2011 1323   MCV 99.2 05/23/2011 1323   MCH 33.4 05/23/2011 1323   MCHC 33.7 05/23/2011 1323   RDW 13.5 05/23/2011 1323   LYMPHSABS 1.6 04/07/2010 1317   MONOABS 0.6 04/07/2010 1317   EOSABS 0.1 04/07/2010 1317  BASOSABS 0.0 04/07/2010 1317    Lab Results  Component Value Date   PLT 90* 05/23/2011     RADIOGRAPHIC STUDIES: 05/30/11  *RADIOLOGY REPORT*  Clinical Data: Right upper quadrant pain. Thrombocytopenia.  COMPLETE ABDOMINAL ULTRASOUND  Comparison: CT abdomen and pelvis 04/07/2010.  Findings:   Gallbladder: No gallstones, gallbladder wall thickening, or  pericholecystic fluid.  Common bile duct: Normal in caliber. No biliary ductal dilation.  The maximal diameters 3.8 mm  Liver: No focal lesion identified. Within normal limits in  parenchymal echogenicity.  IVC: Appears normal.  Pancreas: No focal abnormality seen.  Spleen: Normal size and echotexture without focal parenchymal  abnormality.  Right Kidney: No hydronephrosis. Well-preserved cortex. Normal  size and parenchymal echotexture without focal abnormalities. The  maximal length is 10.5 cm.  Left Kidney: No hydronephrosis. Well-preserved cortex. Normal  size and parenchymal echotexture without focal abnormalities. The  maximal length is 10.4 cm.  Abdominal aorta: No aneurysm identified.  IMPRESSION:  Negative abdominal ultrasound.  Original Report Authenticated By: Jamesetta Orleans. MATTERN, M.D.    ASSESSMENT:  1. Thrombocytopenia, stable 2. Anemia, stable and slightly improved on most recent lab work. 3. Leukopenia, stable and slightly improved on most recent lab work. 4. Seizures, on anti-seizure medication.   PLAN:  1. Lab work monthly: CBC diff 2. I personally reviewed and went over laboratory results with the patient. 3. I personally reviewed and went over radiographic studies with the patient. 4. Referral to Dr. Anne Hahn, Neurology, for second opinion of anti-seizure medication regimen and the association of her medication and blood counts.  5. Return in 3 months for follow-up.   All questions were answered. The patient knows to call the clinic with any problems, questions or concerns. We can certainly see the patient much sooner if necessary.   Rivka Baune

## 2011-06-06 NOTE — Patient Instructions (Signed)
[facility]  Discharge Instructions  RECOMMENDATIONS MADE BY THE CONSULTANT AND ANY TEST RESULTS WILL BE SENT TO YOUR REFERRING DOCTOR.   EXAM FINDINGS BY MD TODAY AND SIGNS AND SYMPTOMS TO REPORT TO CLINIC OR PRIMARY MD: Return to clinic monthly for lab work. Return to clinic in 3 months to see Dr.Neijstrom.   I acknowledge that I have been informed and understand all the instructions given to me and received a copy. I do not have any more questions at this time, but understand that I may call the Specialty Clinic at [facility] at [phone] during business hours should I have any further questions or need assistance in obtaining follow-up care.    __________________________________________  _____________  __________ Signature of Patient or Authorized Representative            Date                   Time    __________________________________________ Nurse's Signature

## 2011-06-11 DIAGNOSIS — G40919 Epilepsy, unspecified, intractable, without status epilepticus: Secondary | ICD-10-CM | POA: Diagnosis not present

## 2011-06-11 DIAGNOSIS — R259 Unspecified abnormal involuntary movements: Secondary | ICD-10-CM | POA: Diagnosis not present

## 2011-06-11 DIAGNOSIS — R5383 Other fatigue: Secondary | ICD-10-CM | POA: Diagnosis not present

## 2011-06-11 DIAGNOSIS — R5381 Other malaise: Secondary | ICD-10-CM | POA: Diagnosis not present

## 2011-06-11 DIAGNOSIS — Z79899 Other long term (current) drug therapy: Secondary | ICD-10-CM | POA: Diagnosis not present

## 2011-06-24 ENCOUNTER — Encounter (HOSPITAL_BASED_OUTPATIENT_CLINIC_OR_DEPARTMENT_OTHER): Payer: Medicare Other

## 2011-06-24 DIAGNOSIS — D696 Thrombocytopenia, unspecified: Secondary | ICD-10-CM | POA: Diagnosis not present

## 2011-06-24 DIAGNOSIS — R569 Unspecified convulsions: Secondary | ICD-10-CM | POA: Diagnosis not present

## 2011-06-24 LAB — CBC
HCT: 38 % (ref 36.0–46.0)
Hemoglobin: 13 g/dL (ref 12.0–15.0)
MCHC: 34.2 g/dL (ref 30.0–36.0)
MCV: 97.4 fL (ref 78.0–100.0)
WBC: 5.9 10*3/uL (ref 4.0–10.5)

## 2011-06-24 NOTE — Progress Notes (Signed)
Labs drawn today for cbc 

## 2011-07-02 DIAGNOSIS — J309 Allergic rhinitis, unspecified: Secondary | ICD-10-CM | POA: Diagnosis not present

## 2011-07-02 DIAGNOSIS — IMO0002 Reserved for concepts with insufficient information to code with codable children: Secondary | ICD-10-CM | POA: Diagnosis not present

## 2011-07-18 DIAGNOSIS — Z79899 Other long term (current) drug therapy: Secondary | ICD-10-CM | POA: Diagnosis not present

## 2011-07-29 ENCOUNTER — Encounter (HOSPITAL_COMMUNITY): Payer: Medicare Other

## 2011-07-29 ENCOUNTER — Other Ambulatory Visit (HOSPITAL_COMMUNITY): Payer: Medicare Other

## 2011-08-01 DIAGNOSIS — G40309 Generalized idiopathic epilepsy and epileptic syndromes, not intractable, without status epilepticus: Secondary | ICD-10-CM | POA: Diagnosis not present

## 2011-08-24 DIAGNOSIS — H18839 Recurrent erosion of cornea, unspecified eye: Secondary | ICD-10-CM | POA: Diagnosis not present

## 2011-08-26 ENCOUNTER — Encounter (HOSPITAL_COMMUNITY): Payer: Medicare Other | Attending: Oncology

## 2011-08-26 DIAGNOSIS — D696 Thrombocytopenia, unspecified: Secondary | ICD-10-CM

## 2011-08-26 LAB — BASIC METABOLIC PANEL
BUN: 24 mg/dL — ABNORMAL HIGH (ref 6–23)
Calcium: 10.2 mg/dL (ref 8.4–10.5)
Creatinine, Ser: 1.03 mg/dL (ref 0.50–1.10)
GFR calc non Af Amer: 63 mL/min — ABNORMAL LOW (ref >90)
Glucose, Bld: 107 mg/dL — ABNORMAL HIGH (ref 70–99)

## 2011-08-26 LAB — DIFFERENTIAL
Eosinophils Absolute: 0.2 10*3/uL (ref 0.0–0.7)
Eosinophils Relative: 3 % (ref 0–5)
Lymphs Abs: 2.5 10*3/uL (ref 0.7–4.0)
Monocytes Relative: 10 % (ref 3–12)

## 2011-08-26 LAB — CBC
Hemoglobin: 12.7 g/dL (ref 12.0–15.0)
MCH: 31.8 pg (ref 26.0–34.0)
MCV: 96.3 fL (ref 78.0–100.0)
RBC: 4 MIL/uL (ref 3.87–5.11)

## 2011-08-26 NOTE — Progress Notes (Signed)
Labs drawn today for cbc/diff,bmp 

## 2011-09-04 ENCOUNTER — Encounter (HOSPITAL_COMMUNITY): Payer: Medicare Other | Attending: Oncology | Admitting: Oncology

## 2011-09-04 VITALS — BP 115/77 | HR 86 | Temp 97.9°F | Wt 153.0 lb

## 2011-09-04 DIAGNOSIS — D693 Immune thrombocytopenic purpura: Secondary | ICD-10-CM | POA: Diagnosis not present

## 2011-09-04 DIAGNOSIS — D696 Thrombocytopenia, unspecified: Secondary | ICD-10-CM | POA: Insufficient documentation

## 2011-09-04 NOTE — Patient Instructions (Signed)
Maria [name]  [id number] Jul 21, 1960  [facility]  Discharge Instructions  RECOMMENDATIONS MADE BY THE CONSULTANT AND ANY TEST RESULTS WILL BE SENT TO YOUR REFERRING DOCTOR.   EXAM FINDINGS BY MD TODAY AND SIGNS AND SYMPTOMS TO REPORT TO CLINIC OR PRIMARY MD: Exam and discussion per MD.  No changes today.  We will check labs in August and have you see the PA.  MEDICATIONS PRESCRIBED: none    SPECIAL INSTRUCTIONS/FOLLOW-UP: Lab work Needed in August and Return to Clinic to see PA after labs in August.   I acknowledge that I have been informed and understand all the instructions given to me and received a copy. I do not have any more questions at this time, but understand that I may call the Specialty Clinic at [facility] at [phone] during business hours should I have any further questions or need assistance in obtaining follow-up care.    __________________________________________  _____________  __________ Signature of Patient or Authorized Representative            Date                   Time    __________________________________________ Nurse's Signature

## 2011-09-04 NOTE — Progress Notes (Signed)
This office note has been dictated.

## 2011-09-04 NOTE — Progress Notes (Signed)
CC:   Kirk Ruths, M.D. Marlan Palau, M.D.  DIAGNOSIS: 1. Thrombocytopenia thus far consistent with low-grade idiopathic     thrombocytopenic purpura. 2. History of a seizure disorder on medication. 3. History of tonsillectomy and adenoidectomy. 4. History of total abdominal hysterectomy in the past. Amesha is here with her mother and has done very well in the last couple of years.  Her platelets have stabilized around 90,000 to 145,000 range. She has no bleeding issues whatsoever.  She feels good.  She is very quiet, of course.  Her mother does the vast majority of the talking but there have been no bleeding episodes whatsoever.  Her platelets the other day were once again quite good, 127,000.  Before that, they were 142,000 in January, 90,000 in December but this is the usual range she is in.  Her white count remains normal.  Hemoglobin remains normal.  We only need to watch her and we will see her again in 4 months.  If all is stable then, we will go to 6 months thereafter.  I will get her to see my PA, Jenita Seashore, after the labs in August.  Again, if everything is stable, we will go to a 22-month followup, and she knows, of course, to bring her daughter in should she have any bleeding, especially during an episode of a viral infection, etc.    ______________________________ Ladona Horns. Mariel Sleet, MD ESN/MEDQ  D:  09/04/2011  T:  09/04/2011  Job:  [id number]

## 2011-09-16 DIAGNOSIS — H18839 Recurrent erosion of cornea, unspecified eye: Secondary | ICD-10-CM | POA: Diagnosis not present

## 2011-10-01 DIAGNOSIS — Z01419 Encounter for gynecological examination (general) (routine) without abnormal findings: Secondary | ICD-10-CM | POA: Diagnosis not present

## 2011-10-01 DIAGNOSIS — Z124 Encounter for screening for malignant neoplasm of cervix: Secondary | ICD-10-CM | POA: Diagnosis not present

## 2011-10-01 DIAGNOSIS — N951 Menopausal and female climacteric states: Secondary | ICD-10-CM | POA: Diagnosis not present

## 2011-11-19 DIAGNOSIS — Z5181 Encounter for therapeutic drug level monitoring: Secondary | ICD-10-CM | POA: Diagnosis not present

## 2011-11-19 DIAGNOSIS — G40309 Generalized idiopathic epilepsy and epileptic syndromes, not intractable, without status epilepticus: Secondary | ICD-10-CM | POA: Diagnosis not present

## 2011-12-10 DIAGNOSIS — Z1231 Encounter for screening mammogram for malignant neoplasm of breast: Secondary | ICD-10-CM | POA: Diagnosis not present

## 2011-12-17 DIAGNOSIS — G40309 Generalized idiopathic epilepsy and epileptic syndromes, not intractable, without status epilepticus: Secondary | ICD-10-CM | POA: Diagnosis not present

## 2011-12-18 DIAGNOSIS — H5210 Myopia, unspecified eye: Secondary | ICD-10-CM | POA: Diagnosis not present

## 2011-12-18 DIAGNOSIS — H401390 Pigmentary glaucoma, unspecified eye, stage unspecified: Secondary | ICD-10-CM | POA: Diagnosis not present

## 2011-12-18 DIAGNOSIS — H409 Unspecified glaucoma: Secondary | ICD-10-CM | POA: Diagnosis not present

## 2011-12-23 DIAGNOSIS — J069 Acute upper respiratory infection, unspecified: Secondary | ICD-10-CM | POA: Diagnosis not present

## 2011-12-23 DIAGNOSIS — IMO0002 Reserved for concepts with insufficient information to code with codable children: Secondary | ICD-10-CM | POA: Diagnosis not present

## 2011-12-26 DIAGNOSIS — M949 Disorder of cartilage, unspecified: Secondary | ICD-10-CM | POA: Diagnosis not present

## 2011-12-26 DIAGNOSIS — M899 Disorder of bone, unspecified: Secondary | ICD-10-CM | POA: Diagnosis not present

## 2012-01-06 ENCOUNTER — Encounter (HOSPITAL_COMMUNITY): Payer: Medicare Other | Attending: Oncology

## 2012-01-06 ENCOUNTER — Other Ambulatory Visit (HOSPITAL_COMMUNITY): Payer: Medicare Other

## 2012-01-06 DIAGNOSIS — D696 Thrombocytopenia, unspecified: Secondary | ICD-10-CM

## 2012-01-06 DIAGNOSIS — D693 Immune thrombocytopenic purpura: Secondary | ICD-10-CM | POA: Insufficient documentation

## 2012-01-06 LAB — CBC
MCH: 31.9 pg (ref 26.0–34.0)
MCHC: 33.7 g/dL (ref 30.0–36.0)
Platelets: 125 10*3/uL — ABNORMAL LOW (ref 150–400)

## 2012-01-06 NOTE — Progress Notes (Signed)
Labs drawn today for cbc 

## 2012-01-07 ENCOUNTER — Ambulatory Visit (HOSPITAL_COMMUNITY): Payer: Medicare Other | Admitting: Oncology

## 2012-01-07 ENCOUNTER — Encounter (HOSPITAL_BASED_OUTPATIENT_CLINIC_OR_DEPARTMENT_OTHER): Payer: Medicare Other | Admitting: Oncology

## 2012-01-07 ENCOUNTER — Encounter (HOSPITAL_COMMUNITY): Payer: Self-pay | Admitting: Oncology

## 2012-01-07 VITALS — BP 116/82 | HR 67 | Temp 97.4°F | Resp 18 | Wt 147.2 lb

## 2012-01-07 DIAGNOSIS — D696 Thrombocytopenia, unspecified: Secondary | ICD-10-CM | POA: Diagnosis not present

## 2012-01-07 NOTE — Patient Instructions (Signed)
[facility]  Discharge Instructions Maria [name]  [id number] [birth year] Dr. Glenford Peers RECOMMENDATIONS MADE BY THE CONSULTANT AND ANY TEST RESULTS WILL BE SENT TO YOUR REFERRING DOCTOR.   EXAM FINDINGS BY MD TODAY AND SIGNS AND SYMPTOMS TO REPORT TO CLINIC OR PRIMARY MD:  Exam per Dr. Mariel Sleet SPECIAL INSTRUCTIONS/FOLLOW-UP: Labs and Dr's visit in 6 months   I acknowledge that I have been informed and understand all the instructions given to me and received a copy. I do not have any more questions at this time, but understand that I may call the Specialty Clinic at [facility] at [phone] during business hours should I have any further questions or need assistance in obtaining follow-up care.    __________________________________________  _____________  __________ Signature of Patient or Authorized Representative            Date                   Time    __________________________________________ Nurse's Signature

## 2012-01-07 NOTE — Progress Notes (Signed)
Kirk Ruths, MD [address] [place] [address]  1. Thrombocytopenia  CBC    CURRENT THERAPY: Observation with labs.  INTERVAL HISTORY: Maria [name] 52 y.o. female returns for  regular  visit for followup of Thrombocytopenia thus far consistent with low-grade idiopathic thrombocytopenic purpura.   I personally reviewed and went over laboratory results with the patient.  Her platelet count remains very stable over the past couple of years.  Her platelets have stabilized around 90,000- 145,000.  She is accompanied by her mother today.  Since she is remains stable, we will space out lab appointments and follow-up appointments to every 6 months.  The patient and her mother understand to report to the clinic with any bleeding, particularly in the event of a viral infection, etc.   They are pleased with the stability of the patient's lab work.  She will continue to follow-up with neurology.  They understand that we will see the patient sooner if need be.    Past Medical History  Diagnosis Date  . Seizures   . GERD (gastroesophageal reflux disease)   . Mental retardation     "Mild"    has CLOSED FRACTURE OF ACROMIAL END OF CLAVICLE; Seizures; GERD (gastroesophageal reflux disease); and Thrombocytopenia on her problem list.     is allergic to codeine.  Ms. Thakur had no medications administered during this visit.  Past Surgical History  Procedure Date  . Total abdominal hysterectomy   . Tonsillectomy and adenoidectomy     Denies any headaches, dizziness, double vision, fevers, chills, night sweats, nausea, vomiting, diarrhea, constipation, chest pain, heart palpitations, shortness of breath, blood in stool, black tarry stool, urinary pain, urinary burning, urinary frequency, hematuria.   PHYSICAL EXAMINATION  ECOG PERFORMANCE STATUS: 2 - Symptomatic, <50% confined to bed  Filed Vitals:   01/07/12 1133  BP: 116/82  Pulse: 67  Temp: 97.4 F  (36.3 C)  Resp: 18    GENERAL:alert, well nourished, well developed, comfortable, cooperative, smiling and mentally incapacitated SKIN: skin color, texture, turgor are normal, no rashes or significant lesions HEAD: Normocephalic, No masses, lesions, tenderness or abnormalities EYES: normal, Conjunctiva are pink and non-injected EARS: External ears normal OROPHARYNX:lips, buccal mucosa, and tongue normal and mucous membranes are moist  NECK: supple, trachea midline LYMPH:  not examined BREAST:not examined LUNGS: clear to auscultation  HEART: regular rate & rhythm, no murmurs and no gallops ABDOMEN:non-tender and normal bowel sounds BACK: Back symmetric, no curvature. EXTREMITIES:less then 2 second capillary refill, no edema, no cyanosis  NEURO: alert & oriented x 3 with fluent speech, mentally retarded, gait normal   LABORATORY DATA:  CBC    Component Value Date/Time   WBC 6.1 01/06/2012 1129   RBC 4.30 01/06/2012 1129   HGB 13.7 01/06/2012 1129   HCT 40.7 01/06/2012 1129   PLT 125* 01/06/2012 1129   MCV 94.7 01/06/2012 1129   MCH 31.9 01/06/2012 1129   MCHC 33.7 01/06/2012 1129   RDW 13.5 01/06/2012 1129   LYMPHSABS 2.5 08/26/2011 1108   MONOABS 0.5 08/26/2011 1108   EOSABS 0.2 08/26/2011 1108   BASOSABS 0.0 08/26/2011 1108   Results for DORINNE, GRAEFF (MRN [id number]) as of 01/07/2012 11:02  Ref. Range 06/24/2011 10:58 07/18/2011 12:25 08/26/2011 11:08 01/06/2012 11:29  Platelets Latest Range: 150-400 K/uL 142 (L)  127 (L) 125 (L)      ASSESSMENT:  1. Thrombocytopenia thus far consistent with low-grade idiopathic thrombocytopenic purpura.  2. History of  a seizure disorder on medication.  3. History of tonsillectomy and adenoidectomy.  4. History of total abdominal hysterectomy in the past.   PLAN:  1. I personally reviewed and went over laboratory results with the patient. 2. Lab work in 6 months: CBC  3. Patient and mother informed that they should call the clinic with any bleeding  especially during an episode of a viral infection, etc.  4. Return in 6 months for follow-up.    All questions were answered. The patient knows to call the clinic with any problems, questions or concerns. We can certainly see the patient much sooner if necessary.  Fayne Mcguffee

## 2012-03-11 ENCOUNTER — Ambulatory Visit (INDEPENDENT_AMBULATORY_CARE_PROVIDER_SITE_OTHER): Payer: Medicare Other | Admitting: Internal Medicine

## 2012-03-11 ENCOUNTER — Encounter (INDEPENDENT_AMBULATORY_CARE_PROVIDER_SITE_OTHER): Payer: Self-pay | Admitting: Internal Medicine

## 2012-03-11 VITALS — BP 110/60 | HR 76 | Temp 98.0°F | Ht 66.0 in | Wt 144.2 lb

## 2012-03-11 DIAGNOSIS — K219 Gastro-esophageal reflux disease without esophagitis: Secondary | ICD-10-CM

## 2012-03-11 MED ORDER — DEXLANSOPRAZOLE 60 MG PO CPDR: 60.0000 mg | DELAYED_RELEASE_CAPSULE | Freq: Every day | ORAL | Status: DC

## 2012-03-11 NOTE — Progress Notes (Signed)
Subjective:     Patient ID: Maria [name], female   DOB: 1960-10-15, 51 y.o.   MRN: [id number]  HPI Here today for f/u of her acid reflux. She tells me she feels good. No acid reflux.  The Dexilant is controlling her acid reflux. Appetite is good. No abdominal pain. She has a BM about 1-2 a day. No dysphagia. Hx of seizures.  New diagnosis of thrombocytopenia thought to be medication related. EGD/ED 2011: Benign GE junction mucosa. No intestinal metaplasia, dysplasia or malignancy.   CBC    Component Value Date/Time   WBC 6.1 01/06/2012 1129   RBC 4.30 01/06/2012 1129   HGB 13.7 01/06/2012 1129   HCT 40.7 01/06/2012 1129   PLT 125* 01/06/2012 1129   MCV 94.7 01/06/2012 1129   MCH 31.9 01/06/2012 1129   MCHC 33.7 01/06/2012 1129   RDW 13.5 01/06/2012 1129   LYMPHSABS 2.5 08/26/2011 1108   MONOABS 0.5 08/26/2011 1108   EOSABS 0.2 08/26/2011 1108   BASOSABS 0.0 08/26/2011 1108       Review of Systems see hpi Current Outpatient Prescriptions  Medication Sig Dispense Refill  . calcium carbonate (TUMS - DOSED IN MG ELEMENTAL CALCIUM) 500 MG chewable tablet Chew 1 tablet by mouth 2 (two) times daily.       Marland Kitchen dexlansoprazole (DEXILANT) 60 MG capsule Take 60 mg by mouth daily.        . diazepam (VALIUM) 5 MG tablet Take 5 mg by mouth every 6 (six) hours as needed.        . divalproex (DEPAKOTE) 500 MG DR tablet Take 500 mg by mouth. One in am and 2 hs       . estradiol (VIVELLE-DOT) 0.075 MG/24HR Place 1 patch onto the skin 2 (two) times a week.      Marland Kitchen ibuprofen (ADVIL,MOTRIN) 200 MG tablet Take 200 mg by mouth every 6 (six) hours as needed.        . lacosamide (VIMPAT) 200 MG TABS Take by mouth 2 (two) times daily.      Marland Kitchen latanoprost (XALATAN) 0.005 % ophthalmic solution Place 1 drop into both eyes at bedtime.      Marland Kitchen levocetirizine (XYZAL) 5 MG tablet Take 5 mg by mouth every evening.        . Pediatric Multiple Vit-C-FA (PEDIATRIC MULTIVITAMIN) chewable tablet Chew 1 tablet by mouth daily.          Past Medical History  Diagnosis Date  . Seizures   . GERD (gastroesophageal reflux disease)   . Mental retardation     "Mild"   Past Surgical History  Procedure Date  . Total abdominal hysterectomy   . Tonsillectomy and adenoidectomy    History   Social History  . Marital Status: Single    Spouse Name: N/A    Number of Children: N/A  . Years of Education: N/A   Occupational History  . Not on file.   Social History Main Topics  . Smoking status: Former Smoker    Types: Cigarettes    Quit date: 05/22/2006  . Smokeless tobacco: Never Used  . Alcohol Use: No  . Drug Use: No  . Sexually Active: No     Hysterectomy   Other Topics Concern  . Not on file   Social History Narrative  . No narrative on file   Family Status  Relation Status Death Age  . Mother Alive     good health  . Father Deceased  [place]  . Sister Alive     good health   Allergies  Allergen Reactions  . Codeine     REACTION: nausea, vomiting        Objective:   Physical Exam.     Filed Vitals:   03/11/12 1444  BP: 110/60  Pulse: 76  Temp: 98 F (36.7 C)  Height: 5\' 6"  (1.676 m)  Weight: 144 lb 3.2 oz (65.409 kg)     Alert and oriented. Skin warm and dry. Oral mucosa is moist.   . Sclera anicteric, conjunctivae is pink. Thyroid not enlarged. No cervical lymphadenopathy. Lungs clear. Heart regular rate and rhythm.  Abdomen is soft. Bowel sounds are positive. No hepatomegaly. No abdominal masses felt. No tenderness.  No edema to lower extremities.  Petechial like area to her abdomen.  Assessment:    GERD well controlled at this time.     Plan:    F/u in one year unless you have a problem. Call office for appt.  Rx for Dexilant eprescribed to her pharmacy with 11 refills.

## 2012-03-11 NOTE — Patient Instructions (Signed)
F/u in one year

## 2012-03-20 DIAGNOSIS — G40309 Generalized idiopathic epilepsy and epileptic syndromes, not intractable, without status epilepticus: Secondary | ICD-10-CM | POA: Diagnosis not present

## 2012-04-14 DIAGNOSIS — M259 Joint disorder, unspecified: Secondary | ICD-10-CM | POA: Diagnosis not present

## 2012-04-14 DIAGNOSIS — K120 Recurrent oral aphthae: Secondary | ICD-10-CM | POA: Diagnosis not present

## 2012-04-14 DIAGNOSIS — IMO0002 Reserved for concepts with insufficient information to code with codable children: Secondary | ICD-10-CM | POA: Diagnosis not present

## 2012-05-08 DIAGNOSIS — N3941 Urge incontinence: Secondary | ICD-10-CM | POA: Diagnosis not present

## 2012-06-16 DIAGNOSIS — H25049 Posterior subcapsular polar age-related cataract, unspecified eye: Secondary | ICD-10-CM | POA: Diagnosis not present

## 2012-06-16 DIAGNOSIS — H401390 Pigmentary glaucoma, unspecified eye, stage unspecified: Secondary | ICD-10-CM | POA: Diagnosis not present

## 2012-06-16 DIAGNOSIS — H409 Unspecified glaucoma: Secondary | ICD-10-CM | POA: Diagnosis not present

## 2012-07-07 DIAGNOSIS — R32 Unspecified urinary incontinence: Secondary | ICD-10-CM | POA: Diagnosis not present

## 2012-07-09 ENCOUNTER — Encounter (HOSPITAL_COMMUNITY): Payer: Medicare Other | Attending: Oncology

## 2012-07-09 DIAGNOSIS — D696 Thrombocytopenia, unspecified: Secondary | ICD-10-CM | POA: Diagnosis not present

## 2012-07-09 LAB — CBC
HCT: 41.6 % (ref 36.0–46.0)
Platelets: 136 10*3/uL — ABNORMAL LOW (ref 150–400)
RBC: 4.35 MIL/uL (ref 3.87–5.11)
RDW: 13.1 % (ref 11.5–15.5)
WBC: 5.8 10*3/uL (ref 4.0–10.5)

## 2012-07-09 NOTE — Progress Notes (Signed)
Labs drawn today for cbc 

## 2012-07-13 ENCOUNTER — Ambulatory Visit (HOSPITAL_COMMUNITY): Payer: Medicare Other | Admitting: Oncology

## 2012-08-17 ENCOUNTER — Encounter (HOSPITAL_COMMUNITY): Payer: Medicare Other | Attending: Oncology | Admitting: Oncology

## 2012-08-17 ENCOUNTER — Encounter (HOSPITAL_COMMUNITY): Payer: Self-pay | Admitting: Oncology

## 2012-08-17 VITALS — BP 94/66 | HR 69 | Temp 97.5°F | Resp 16 | Wt 145.8 lb

## 2012-08-17 DIAGNOSIS — K219 Gastro-esophageal reflux disease without esophagitis: Secondary | ICD-10-CM | POA: Diagnosis not present

## 2012-08-17 DIAGNOSIS — F79 Unspecified intellectual disabilities: Secondary | ICD-10-CM

## 2012-08-17 DIAGNOSIS — D696 Thrombocytopenia, unspecified: Secondary | ICD-10-CM | POA: Diagnosis not present

## 2012-08-17 NOTE — Patient Instructions (Addendum)
.  [facility] Discharge Instructions  RECOMMENDATIONS MADE BY THE CONSULTANT AND ANY TEST RESULTS WILL BE SENT TO YOUR REFERRING PHYSICIAN.  EXAM FINDINGS BY THE PHYSICIAN TODAY AND SIGNS OR SYMPTOMS TO REPORT TO CLINIC OR PRIMARY PHYSICIAN: Exam per Dr. Mariel Sleet     INSTRUCTIONS GIVEN AND DISCUSSED: Cbc in 6 months  SPECIAL INSTRUCTIONS/FOLLOW-UP: 6 months to see T. Kefalas PA  Thank you for choosing [facility] to provide your oncology and hematology care.  To afford each patient quality time with our providers, please arrive at least 15 minutes before your scheduled appointment time.  With your help, our goal is to use those 15 minutes to complete the necessary work-up to ensure our physicians have the information they need to help with your evaluation and healthcare recommendations.    Effective January 1st, 2014, we ask that you re-schedule your appointment with our physicians should you arrive 10 or more minutes late for your appointment.  We strive to give you quality time with our providers, and arriving late affects you and other patients whose appointments are after yours.    Again, thank you for choosing [facility].  Our hope is that these requests will decrease the amount of time that you wait before being seen by our physicians.       _____________________________________________________________  Should you have questions after your visit to [facility], please contact our office at [phone] between the hours of 8:30 a.m. and 5:00 p.m.  Voicemails left after 4:30 p.m. will not be returned until the following business day.  For prescription refill requests, have your pharmacy contact our office with your prescription refill request.

## 2012-08-17 NOTE — Progress Notes (Signed)
Problem #1 thrombocytopenia, consistent with low-grade ITP Problem #2 history of seizures on therapy Problem #3 mental retardation Problem #4 GERD  She is having no bleeding whatsoever from the nose, gums, GU or GI tracts.  Her platelets are very stable. Her mother was concerned about the skin rash on her trunk arms thighs. This is consistent with cherry hemangiomata. She has numerous ones from small the much larger but are asymptomatic. Her mother wondered whether they were related to her platelets but they are not. They are palpable whereas petechiae are not.  She has no petechiae on her legs or arms. She is in no acute distress. Therefore we will see her back in 6 months just in followup. There is no intervention for her at this time.

## 2012-08-18 ENCOUNTER — Telehealth (INDEPENDENT_AMBULATORY_CARE_PROVIDER_SITE_OTHER): Payer: Self-pay | Admitting: *Deleted

## 2012-08-18 DIAGNOSIS — K219 Gastro-esophageal reflux disease without esophagitis: Secondary | ICD-10-CM

## 2012-08-18 MED ORDER — DEXLANSOPRAZOLE 60 MG PO CPDR: 60.0000 mg | DELAYED_RELEASE_CAPSULE | Freq: Every day | ORAL | Status: DC

## 2012-08-18 NOTE — Telephone Encounter (Signed)
[name], mother, called and would like for [name], NP to please fax a new rx for Dexilant 3 month supply to [facility]. The three month co-pay is the same as a one month co-pay. The three month will be a lot cheaper for [name]. [name]'s return phone number is [phone].

## 2012-08-18 NOTE — Telephone Encounter (Signed)
Rx sent to her pharmacy 

## 2012-10-05 ENCOUNTER — Telehealth: Payer: Self-pay | Admitting: Neurology

## 2012-10-05 DIAGNOSIS — Z124 Encounter for screening for malignant neoplasm of cervix: Secondary | ICD-10-CM | POA: Diagnosis not present

## 2012-10-05 DIAGNOSIS — Z01419 Encounter for gynecological examination (general) (routine) without abnormal findings: Secondary | ICD-10-CM | POA: Diagnosis not present

## 2012-10-05 MED ORDER — DIVALPROEX SODIUM ER 500 MG PO TB24: 500.0000 mg | ORAL_TABLET | Freq: Two times a day (BID) | ORAL | Status: DC

## 2012-10-05 NOTE — Telephone Encounter (Signed)
Rx has been sent  

## 2012-10-06 ENCOUNTER — Encounter: Payer: Self-pay | Admitting: Nurse Practitioner

## 2012-10-06 ENCOUNTER — Ambulatory Visit (INDEPENDENT_AMBULATORY_CARE_PROVIDER_SITE_OTHER): Payer: Medicare Other | Admitting: Nurse Practitioner

## 2012-10-06 VITALS — BP 105/67 | HR 63 | Ht 66.0 in | Wt 141.0 lb

## 2012-10-06 DIAGNOSIS — G40309 Generalized idiopathic epilepsy and epileptic syndromes, not intractable, without status epilepticus: Secondary | ICD-10-CM | POA: Diagnosis not present

## 2012-10-06 DIAGNOSIS — F79 Unspecified intellectual disabilities: Secondary | ICD-10-CM | POA: Diagnosis not present

## 2012-10-06 DIAGNOSIS — Z7189 Other specified counseling: Secondary | ICD-10-CM | POA: Insufficient documentation

## 2012-10-06 DIAGNOSIS — Z5181 Encounter for therapeutic drug level monitoring: Secondary | ICD-10-CM

## 2012-10-06 HISTORY — DX: Other specified counseling: Z71.89

## 2012-10-06 MED ORDER — LACOSAMIDE 200 MG PO TABS: 200.0000 mg | ORAL_TABLET | Freq: Two times a day (BID) | ORAL | Status: DC

## 2012-10-06 NOTE — Patient Instructions (Addendum)
Seizure records reviewed, 9-seizures since last seen. Increase Vimpat to 200 mg twice daily Continue Depakote 500 mg twice daily Depakote level trough in the morning Followup in 6 months, continue to keep a record of seizures

## 2012-10-06 NOTE — Progress Notes (Signed)
I have read the note, and I agree with the clinical assessment and plan.  

## 2012-10-06 NOTE — Progress Notes (Signed)
HPI: She returns for followup after last visit 03/20/2012. She has a history of intractable seizure disorder. She's had a total of 9 seizures in the last 6 months. She is on Depakote and Vimpat has been added. She had recent CBC done in February by oncology. She has a low platelet count. Liver functions were not done at that time.  ROS:  seizures  Physical Exam General: well developed, well nourished, seated, in no evident distress Head: head normocephalic and atraumatic. Oropharynx benign Neck: supple with no carotid or supraclavicular bruits Cardiovascular: regular rate and rhythm, no murmurs  Neurologic Exam Mental Status: Awake and  alert. Mild MR, follows most commands .  Cranial Nerves: Fundoscopic exam reveals flat disc margins. Pupils equal, briskly reactive to light. Extraocular movements full without nystagmus. Visual fields full to confrontation. Hearing intact and symmetric to finger snap. Facial sensation intact. Face, tongue, palate move normally and symmetrically. Neck flexion and extension normal.  Motor: Normal bulk and tone. Normal strength in all tested extremity muscles. Apraxia noted with the use of the extremities. Sensory.: intact to touch and pinprick and vibratory.  Coordination: Rapid alternating movements slowed. Finger-to-nose and heel-to-shin performed accurately bilaterally. Gait and Station: Arises from chair without difficulty. Stance is normal. Gait demonstrates normal stride length and balance . Able to heel, toe and tandem walk without difficulty.  Reflexes: 2+ and symmetric. Toes downgoing.     ASSESSMENT: Intractable seizure disorder, mild mental retardation     PLAN:  Increase Vimpat to 200 mg twice daily, will renew mail order Continue Depakote 500 mg twice daily Depakote level trough in the morning, CMP Followup in 6 months, continue to keep a record of seizures  Nilda Riggs, GNP-BC APRN

## 2012-10-07 ENCOUNTER — Other Ambulatory Visit: Payer: Self-pay | Admitting: Nurse Practitioner

## 2012-10-07 DIAGNOSIS — G40309 Generalized idiopathic epilepsy and epileptic syndromes, not intractable, without status epilepticus: Secondary | ICD-10-CM | POA: Diagnosis not present

## 2012-10-07 DIAGNOSIS — Z5181 Encounter for therapeutic drug level monitoring: Secondary | ICD-10-CM | POA: Diagnosis not present

## 2012-10-08 LAB — COMPREHENSIVE METABOLIC PANEL
ALT: 12 IU/L (ref 0–32)
AST: 15 IU/L (ref 0–40)
CO2: 27 mmol/L (ref 19–28)
Calcium: 9.9 mg/dL (ref 8.7–10.2)
Chloride: 104 mmol/L (ref 96–108)
Potassium: 3.9 mmol/L (ref 3.5–5.2)
Sodium: 141 mmol/L (ref 134–144)

## 2012-10-08 LAB — VALPROIC ACID LEVEL: Valproic Acid Lvl: 105 ug/mL — ABNORMAL HIGH (ref 50–100)

## 2012-10-08 NOTE — Progress Notes (Signed)
Quick Note:  Labs look good. Continue same meds ______

## 2012-10-09 ENCOUNTER — Telehealth: Payer: Self-pay | Admitting: *Deleted

## 2012-10-09 NOTE — Telephone Encounter (Signed)
Spoke to patient mom and she is aware of lab results and to continue same dose of meds.

## 2012-10-09 NOTE — Telephone Encounter (Signed)
Message copied by Ardeth Sportsman on Fri Oct 09, 2012  8:31 AM ------      Message from: Beverely Low      Created: Thu Oct 08, 2012  9:15 AM       Labs look good. Continue same meds ------

## 2012-11-02 ENCOUNTER — Telehealth (INDEPENDENT_AMBULATORY_CARE_PROVIDER_SITE_OTHER): Payer: Self-pay | Admitting: *Deleted

## 2012-11-02 NOTE — Telephone Encounter (Signed)
[name], patient's mother, said Rama's PCP would like for her to have a TCS done. Maria [name] has turned 52 years old. The return phone number is [phone].

## 2012-11-02 NOTE — Telephone Encounter (Signed)
Spoke with a family member concerning Marvelle's colonoscopy.   Maria [name] is a patient of our. She is 51 and is now due for a colonoscopy. Do we need a referral or can u do a telephone triage?

## 2012-11-03 ENCOUNTER — Encounter (INDEPENDENT_AMBULATORY_CARE_PROVIDER_SITE_OTHER): Payer: Self-pay | Admitting: *Deleted

## 2012-11-03 NOTE — Telephone Encounter (Signed)
We rec'd a referral from patient's PCP and I have mailed a letter to patient asking her to call me so I can sch'd colonoscopy

## 2012-11-13 ENCOUNTER — Other Ambulatory Visit (INDEPENDENT_AMBULATORY_CARE_PROVIDER_SITE_OTHER): Payer: Self-pay | Admitting: *Deleted

## 2012-11-13 ENCOUNTER — Encounter (INDEPENDENT_AMBULATORY_CARE_PROVIDER_SITE_OTHER): Payer: Self-pay | Admitting: *Deleted

## 2012-11-13 ENCOUNTER — Telehealth (INDEPENDENT_AMBULATORY_CARE_PROVIDER_SITE_OTHER): Payer: Self-pay | Admitting: *Deleted

## 2012-11-13 DIAGNOSIS — Z1211 Encounter for screening for malignant neoplasm of colon: Secondary | ICD-10-CM

## 2012-11-13 NOTE — Telephone Encounter (Signed)
Patient needs movi prep 

## 2012-11-16 MED ORDER — PEG-KCL-NACL-NASULF-NA ASC-C 100 G PO SOLR: 1.0000 | Freq: Once | ORAL | Status: DC

## 2012-11-30 ENCOUNTER — Telehealth (INDEPENDENT_AMBULATORY_CARE_PROVIDER_SITE_OTHER): Payer: Self-pay | Admitting: *Deleted

## 2012-11-30 NOTE — Telephone Encounter (Signed)
agree

## 2012-11-30 NOTE — Telephone Encounter (Signed)
  Procedure: tcs  Reason/Indication:  screening  Has patient had this procedure before?  no  If so, when, by whom and where?    Is there a family history of colon cancer?  no  Who?  What age when diagnosed?    Is patient diabetic?   no      Does patient have prosthetic heart valve?  no  Do you have a pacemaker?  no  Has patient ever had endocarditis? no  Has patient had joint replacement within last 12 months?  no  Is patient on Coumadin, Plavix and/or Aspirin? no  Medications: calcium 500 mg plus Vit D bid, Depakote 500 mg bid, lacosamide 200 mg bid, dexilant 60 mg daily, children;s vit daily, minivelle 0.075 mg patch 2 times weekly, xalatan 0.005 1 drop each eye, valium 2.5 mg - 5 mg prn, levocetirizine  5 mg daily, oxybutrin 5 mg daily  Allergies: nkda  Medication Adjustment:   Procedure date & time: 12/31/12 at 1015

## 2012-12-05 ENCOUNTER — Encounter (HOSPITAL_COMMUNITY): Payer: Self-pay | Admitting: *Deleted

## 2012-12-05 ENCOUNTER — Emergency Department (HOSPITAL_COMMUNITY)
Admission: EM | Admit: 2012-12-05 | Discharge: 2012-12-05 | Disposition: A | Discharge status: Home / Self Care | Payer: Medicare Other | Attending: Emergency Medicine | Admitting: Emergency Medicine

## 2012-12-05 ENCOUNTER — Emergency Department (HOSPITAL_COMMUNITY): Payer: Medicare Other

## 2012-12-05 DIAGNOSIS — W1809XA Striking against other object with subsequent fall, initial encounter: Secondary | ICD-10-CM | POA: Insufficient documentation

## 2012-12-05 DIAGNOSIS — G40909 Epilepsy, unspecified, not intractable, without status epilepticus: Secondary | ICD-10-CM | POA: Insufficient documentation

## 2012-12-05 DIAGNOSIS — Y9389 Activity, other specified: Secondary | ICD-10-CM | POA: Insufficient documentation

## 2012-12-05 DIAGNOSIS — S0990XA Unspecified injury of head, initial encounter: Secondary | ICD-10-CM | POA: Insufficient documentation

## 2012-12-05 DIAGNOSIS — W19XXXA Unspecified fall, initial encounter: Secondary | ICD-10-CM

## 2012-12-05 DIAGNOSIS — F70 Mild intellectual disabilities: Secondary | ICD-10-CM | POA: Insufficient documentation

## 2012-12-05 DIAGNOSIS — Z87891 Personal history of nicotine dependence: Secondary | ICD-10-CM | POA: Diagnosis not present

## 2012-12-05 DIAGNOSIS — K219 Gastro-esophageal reflux disease without esophagitis: Secondary | ICD-10-CM | POA: Insufficient documentation

## 2012-12-05 DIAGNOSIS — R569 Unspecified convulsions: Secondary | ICD-10-CM

## 2012-12-05 DIAGNOSIS — Y9289 Other specified places as the place of occurrence of the external cause: Secondary | ICD-10-CM | POA: Insufficient documentation

## 2012-12-05 DIAGNOSIS — Z79899 Other long term (current) drug therapy: Secondary | ICD-10-CM | POA: Insufficient documentation

## 2012-12-05 DIAGNOSIS — S199XXA Unspecified injury of neck, initial encounter: Secondary | ICD-10-CM | POA: Diagnosis not present

## 2012-12-05 DIAGNOSIS — R55 Syncope and collapse: Secondary | ICD-10-CM | POA: Diagnosis not present

## 2012-12-05 NOTE — ED Notes (Signed)
Pt had a seizure earlier this morning. Pt fell afterwards "because she was in a daze" Larey Seat down an embankment and hit her head. Pt states pain to back of head.

## 2012-12-05 NOTE — ED Provider Notes (Signed)
History  This chart was scribed for Maria Jakes, MD by Ardelia Mems, ED Scribe. This patient was seen in room APA09/APA09 and the patient's care was started at 3:20 PM.  CSN: [id number]  Arrival date & time 12/05/12  1453    Chief Complaint  Patient presents with  . Fall  . Seizures    The history is provided by a relative. No language interpreter was used.   LEVEL 5 CAVEAT- secondary to the condition of the pt.  HPI Comments: Maria [name] is a 52 y.o. Female with a hx of seizures and mild mental retardation who presents to the Emergency Department complaining of a suspected seizure that occurred this morning. Pt is holding her face and not speaking and a relative who lives with her is in the room speaking on her behalf. Relative states that pt came out of her bedroom this morning and appeared "dazed" and walked abnormally. Mother did not witness a seizure, but suspects a seizure since this is the typical behavior exhibited after one of the pt's seizures. Mother states that pt has a hx of grand mal seizures which last only a few seconds. Pt takes Depakote and Vimpat for her seizures. Mother states that due to being "dazed", pt fell down an embankment about 4 hours ago, and hit the back of her head. Pt admits to pain in the back of her head, and denies pain anywhere else. Relative confirms that this is all that the pt has been complaining of.   PCP- Dr.William McGough   Past Medical History  Diagnosis Date  . Seizures   . GERD (gastroesophageal reflux disease)   . Mental retardation     "Mild"   Past Surgical History  Procedure Laterality Date  . Total abdominal hysterectomy    . Tonsillectomy and adenoidectomy     Family History  Problem Relation Age of Onset  . High Cholesterol Mother   . High blood pressure Mother   . Diabetes Father    History  Substance Use Topics  . Smoking status: Former Smoker    Types: Cigarettes    Quit date: 05/22/2006  . Smokeless  tobacco: Never Used  . Alcohol Use: No   OB History   Grav Para Term Preterm Abortions TAB SAB Ect Mult Living                 Review of Systems  Unable to perform ROS: Acuity of condition  LEVEL 5 CAVEAT- secondary to the condition of the pt.  Allergies  Codeine  Home Medications   Current Outpatient Rx  Name  Route  Sig  Dispense  Refill  . calcium carbonate (TUMS - DOSED IN MG ELEMENTAL CALCIUM) 500 MG chewable tablet   Oral   Chew 1 tablet by mouth 2 (two) times daily.          Marland Kitchen dexlansoprazole (DEXILANT) 60 MG capsule   Oral   Take 1 capsule (60 mg total) by mouth daily.   90 capsule   4   . diazepam (VALIUM) 5 MG tablet   Oral   Take 5 mg by mouth every 6 (six) hours as needed.           . divalproex (DEPAKOTE ER) 500 MG 24 hr tablet   Oral   Take 1 tablet (500 mg total) by mouth 2 (two) times daily.   180 tablet   1   . estradiol (VIVELLE-DOT) 0.075 MG/24HR   Transdermal   Place  1 patch onto the skin 2 (two) times a week.         Marland Kitchen ibuprofen (ADVIL,MOTRIN) 200 MG tablet   Oral   Take 200 mg by mouth every 6 (six) hours as needed.           . lacosamide (VIMPAT) 200 MG TABS   Oral   Take 1 tablet (200 mg total) by mouth 2 (two) times daily.   180 tablet   1   . latanoprost (XALATAN) 0.005 % ophthalmic solution   Both Eyes   Place 1 drop into both eyes at bedtime.         Marland Kitchen levocetirizine (XYZAL) 5 MG tablet   Oral   Take 5 mg by mouth every evening.           Marland Kitchen oxybutynin (DITROPAN-XL) 5 MG 24 hr tablet   Oral   Take 5 mg by mouth daily.         . Pediatric Multiple Vit-C-FA (PEDIATRIC MULTIVITAMIN) chewable tablet   Oral   Chew 1 tablet by mouth daily.           . peg 3350 powder (MOVIPREP) 100 G SOLR   Oral   Take 1 kit (100 g total) by mouth once.   1 kit   0    Triage Vitals: BP 115/75  Pulse 70  Temp(Src) 99.1 F (37.3 C) (Oral)  Resp 17  Ht 5\' 6"  (1.676 m)  Wt 141 lb (63.957 kg)  BMI 22.77 kg/m2  SpO2  98%  Physical Exam  Constitutional: She is oriented to person, place, and time. She appears well-developed and well-nourished.  HENT:  Head: Normocephalic and atraumatic.  Eyes: EOM are normal. Pupils are equal, round, and reactive to light.  Neck: Normal range of motion. Neck supple.  Cardiovascular: Normal rate, regular rhythm, normal heart sounds and intact distal pulses.   No murmur heard. Pulmonary/Chest: Effort normal and breath sounds normal. No respiratory distress. She has no wheezes.  Abdominal: Soft. Bowel sounds are normal. There is no tenderness.  Musculoskeletal: Normal range of motion. She exhibits no tenderness.  Neurological: She is alert and oriented to person, place, and time.  Skin: Skin is warm and dry. No rash noted.  No scrapes on her back.    ED Course  Procedures (including critical care time)  DIAGNOSTIC STUDIES: Oxygen Saturation is 98% on RA, normal by my interpretation.    COORDINATION OF CARE: 3:53 PM- Pt and relative advised of plan to receive a Cat scan and pt and relative agree.   Labs Reviewed - No data to display  Ct Head Wo Contrast  12/05/2012   *RADIOLOGY REPORT*  Clinical Data:  Seizure, fall and altered mental status  CT HEAD WITHOUT CONTRAST CT CERVICAL SPINE WITHOUT CONTRAST  Technique:  Multidetector CT imaging of the head and cervical spine was performed following the standard protocol without intravenous contrast.  Multiplanar CT image reconstructions of the cervical spine were also generated.  Comparison:  Prior CT scan of the head 05/14/2009  CT HEAD  Findings: No acute intracranial hemorrhage, acute infarction, mass lesion, mass effect, midline shift or hydrocephalus.  Gray-white differentiation is preserved throughout.  Cerebellar greater than cerebral atrophy is greater than expected for age.  These findings can be seen in patients with history of seizure disorder and in patients due to longstanding antiseizure medication.  Stable focal  encephalomalacia in the inferior right frontal lobe.  The globes are intact bilaterally.  Unremarkable orbits.  No focal soft tissue abnormality or scalp hematoma.  The calvarium is intact.  Normal aeration of the bilateral mastoid air cells and visualized paranasal sinuses.  Trace atherosclerotic calcifications noted in the cavernous carotid arteries bilaterally.  Interval development of a 9 mm focal circumscribed lucency within the diploic space of the right frontal calvarium.  IMPRESSION:  1.  No acute intracranial abnormality.  2.  New 9 mm circumscribed lytic lesion within the diploic space of the right frontal calvarium concerning for metastatic disease, or developing multiple myeloma.  Consider further evaluation with non emergent MRI of the brain with without contrast.  3.  Age advanced cerebellar greater than cerebral atrophy.  CT CERVICAL SPINE  Findings: No acute fracture, malalignment or prevertebral soft tissue swelling.  Multilevel right worse than left facet arthropathy.  Biapical pleural parenchymal scarring.  No additional focal lytic lesions are identified.  Unremarkable thyroid gland. No focal soft tissue abnormality. Multilevel cervical spondylosis most significant at C5-C6.  There is minimal (2 years 3 mm) anterolisthesis of C4 on C5 which is favored to be degenerative.  IMPRESSION:  1.  No acute fracture or malalignment. 2.  Cervical spondylosis and right worse than left facet arthropathy. 3.  Mild degenerative anterolisthesis of C4 on C5 4.  No additional the lytic lesions identified.   Original Report Authenticated By: Malachy Moan, M.D.   Ct Cervical Spine Wo Contrast  12/05/2012   *RADIOLOGY REPORT*  Clinical Data:  Seizure, fall and altered mental status  CT HEAD WITHOUT CONTRAST CT CERVICAL SPINE WITHOUT CONTRAST  Technique:  Multidetector CT imaging of the head and cervical spine was performed following the standard protocol without intravenous contrast.  Multiplanar CT image  reconstructions of the cervical spine were also generated.  Comparison:  Prior CT scan of the head 05/14/2009  CT HEAD  Findings: No acute intracranial hemorrhage, acute infarction, mass lesion, mass effect, midline shift or hydrocephalus.  Gray-white differentiation is preserved throughout.  Cerebellar greater than cerebral atrophy is greater than expected for age.  These findings can be seen in patients with history of seizure disorder and in patients due to longstanding antiseizure medication.  Stable focal encephalomalacia in the inferior right frontal lobe.  The globes are intact bilaterally.  Unremarkable orbits.  No focal soft tissue abnormality or scalp hematoma.  The calvarium is intact.  Normal aeration of the bilateral mastoid air cells and visualized paranasal sinuses.  Trace atherosclerotic calcifications noted in the cavernous carotid arteries bilaterally.  Interval development of a 9 mm focal circumscribed lucency within the diploic space of the right frontal calvarium.  IMPRESSION:  1.  No acute intracranial abnormality.  2.  New 9 mm circumscribed lytic lesion within the diploic space of the right frontal calvarium concerning for metastatic disease, or developing multiple myeloma.  Consider further evaluation with non emergent MRI of the brain with without contrast.  3.  Age advanced cerebellar greater than cerebral atrophy.  CT CERVICAL SPINE  Findings: No acute fracture, malalignment or prevertebral soft tissue swelling.  Multilevel right worse than left facet arthropathy.  Biapical pleural parenchymal scarring.  No additional focal lytic lesions are identified.  Unremarkable thyroid gland. No focal soft tissue abnormality. Multilevel cervical spondylosis most significant at C5-C6.  There is minimal (2 years 3 mm) anterolisthesis of C4 on C5 which is favored to be degenerative.  IMPRESSION:  1.  No acute fracture or malalignment. 2.  Cervical spondylosis and right worse than left facet  arthropathy. 3.  Mild degenerative  anterolisthesis of C4 on C5 4.  No additional the lytic lesions identified.   Original Report Authenticated By: Malachy Moan, M.D.    1. Seizures   2. Fall, initial encounter   3. Head injury, acute, initial encounter     MDM  No evidence of any acute injury on CT head and neck from the fall. Patient may or may not have had a seizure earlier in the day. But if so the typical for her postictal reaction. However head CT does show concerning for a lytic lesion and will require further workup. Outpatient MRI can be ordered by primary care Dr. Patient will make arrangements for an appointment in the next few days with group.       I personally performed the services described in this documentation, which was scribed in my presence. The recorded information has been reviewed and is accurate.     Maria Jakes, MD 12/05/12 [phone]

## 2012-12-07 ENCOUNTER — Other Ambulatory Visit (HOSPITAL_COMMUNITY): Payer: Self-pay | Admitting: Oncology

## 2012-12-07 DIAGNOSIS — D696 Thrombocytopenia, unspecified: Secondary | ICD-10-CM

## 2012-12-08 ENCOUNTER — Encounter (HOSPITAL_COMMUNITY): Payer: Medicare Other | Attending: Oncology

## 2012-12-08 ENCOUNTER — Ambulatory Visit (HOSPITAL_COMMUNITY)
Admission: RE | Admit: 2012-12-08 | Discharge: 2012-12-08 | Disposition: A | Discharge status: Home / Self Care | Payer: Medicare Other | Source: Ambulatory Visit | Attending: Oncology | Admitting: Oncology

## 2012-12-08 DIAGNOSIS — D696 Thrombocytopenia, unspecified: Secondary | ICD-10-CM

## 2012-12-08 DIAGNOSIS — M899 Disorder of bone, unspecified: Secondary | ICD-10-CM | POA: Insufficient documentation

## 2012-12-08 DIAGNOSIS — R930 Abnormal findings on diagnostic imaging of skull and head, not elsewhere classified: Secondary | ICD-10-CM | POA: Diagnosis not present

## 2012-12-08 DIAGNOSIS — G9389 Other specified disorders of brain: Secondary | ICD-10-CM | POA: Diagnosis not present

## 2012-12-08 LAB — BUN: BUN: 17 mg/dL (ref 6–23)

## 2012-12-08 LAB — CREATININE, SERUM: Creatinine, Ser: 0.84 mg/dL (ref 0.50–1.10)

## 2012-12-08 MED ORDER — GADOBENATE DIMEGLUMINE 529 MG/ML IV SOLN
13.0000 mL | Freq: Once | INTRAVENOUS | Status: AC | PRN
  Administered 2012-12-08: 13 mL via INTRAVENOUS

## 2012-12-08 NOTE — Progress Notes (Signed)
Labs drawn today for Kllc,mm panel,Bun and Crea for 24 urine test

## 2012-12-09 LAB — KAPPA/LAMBDA LIGHT CHAINS: Lambda free light chains: 0.89 mg/dL (ref 0.57–2.63)

## 2012-12-10 ENCOUNTER — Other Ambulatory Visit (HOSPITAL_COMMUNITY): Payer: Self-pay

## 2012-12-10 ENCOUNTER — Encounter (HOSPITAL_COMMUNITY): Payer: Self-pay

## 2012-12-10 ENCOUNTER — Other Ambulatory Visit (HOSPITAL_COMMUNITY): Payer: Self-pay | Admitting: Oncology

## 2012-12-10 DIAGNOSIS — D696 Thrombocytopenia, unspecified: Secondary | ICD-10-CM

## 2012-12-10 LAB — CREATININE CLEARANCE, URINE, 24 HOUR
Creatinine Clearance: 69 mL/min — ABNORMAL LOW (ref 75–115)
Creatinine, 24H Ur: 833 mg/d (ref 700–1800)
Urine Total Volume-CRCL: 1200 mL

## 2012-12-10 LAB — MULTIPLE MYELOMA PANEL, SERUM
Alpha-1-Globulin: 3.9 % (ref 2.9–4.9)
Alpha-2-Globulin: 10.8 % (ref 7.1–11.8)
Beta 2: 4.8 % (ref 3.2–6.5)
Gamma Globulin: 12.2 % (ref 11.1–18.8)
M-Spike, %: NOT DETECTED g/dL

## 2012-12-10 LAB — PROTEIN, URINE, 24 HOUR: Collection Interval-UPROT: 24 hours

## 2012-12-11 ENCOUNTER — Other Ambulatory Visit (HOSPITAL_COMMUNITY): Payer: Self-pay | Admitting: Oncology

## 2012-12-11 ENCOUNTER — Other Ambulatory Visit (HOSPITAL_COMMUNITY): Payer: Self-pay | Admitting: *Deleted

## 2012-12-11 DIAGNOSIS — M899 Disorder of bone, unspecified: Secondary | ICD-10-CM

## 2012-12-11 NOTE — Progress Notes (Signed)
Patient scheduled for CT on Wednesday and Dr. appt r/s for 7/21. Patient's mother is coming 7/14 to pick up prep and instructions.

## 2012-12-14 LAB — UIFE/LIGHT CHAINS/TP QN, 24-HR UR
Albumin, U: DETECTED
Free Lambda Lt Chains,Ur: <0.03 mg/dL (ref 0.02–0.67)
Gamma Globulin, Urine: DETECTED — AB
Time: 24 hours
Total Protein, Urine-Ur/day: 7 mg/d — ABNORMAL LOW (ref 10–140)
Volume, Urine: 1200 mL

## 2012-12-15 ENCOUNTER — Ambulatory Visit (HOSPITAL_COMMUNITY)
Admission: RE | Admit: 2012-12-15 | Discharge: 2012-12-15 | Disposition: A | Discharge status: Home / Self Care | Payer: Medicare Other | Source: Ambulatory Visit | Attending: Oncology | Admitting: Oncology

## 2012-12-15 ENCOUNTER — Other Ambulatory Visit (HOSPITAL_COMMUNITY)

## 2012-12-15 ENCOUNTER — Other Ambulatory Visit (HOSPITAL_COMMUNITY): Payer: Self-pay | Admitting: Oncology

## 2012-12-15 ENCOUNTER — Ambulatory Visit (HOSPITAL_COMMUNITY): Payer: Medicare Other

## 2012-12-15 ENCOUNTER — Other Ambulatory Visit (HOSPITAL_COMMUNITY): Payer: Medicare Other

## 2012-12-15 DIAGNOSIS — Q619 Cystic kidney disease, unspecified: Secondary | ICD-10-CM | POA: Diagnosis not present

## 2012-12-15 DIAGNOSIS — R935 Abnormal findings on diagnostic imaging of other abdominal regions, including retroperitoneum: Secondary | ICD-10-CM | POA: Insufficient documentation

## 2012-12-15 DIAGNOSIS — M949 Disorder of cartilage, unspecified: Secondary | ICD-10-CM | POA: Insufficient documentation

## 2012-12-15 DIAGNOSIS — M899 Disorder of bone, unspecified: Secondary | ICD-10-CM

## 2012-12-15 DIAGNOSIS — R911 Solitary pulmonary nodule: Secondary | ICD-10-CM | POA: Diagnosis not present

## 2012-12-15 DIAGNOSIS — K769 Liver disease, unspecified: Secondary | ICD-10-CM | POA: Diagnosis not present

## 2012-12-15 MED ORDER — IOHEXOL 300 MG/ML  SOLN
100.0000 mL | Freq: Once | INTRAMUSCULAR | Status: AC | PRN
  Administered 2012-12-15: 100 mL via INTRAVENOUS

## 2012-12-16 ENCOUNTER — Ambulatory Visit (HOSPITAL_COMMUNITY): Payer: Medicare Other

## 2012-12-17 ENCOUNTER — Other Ambulatory Visit (HOSPITAL_COMMUNITY): Payer: Self-pay | Admitting: Oncology

## 2012-12-17 ENCOUNTER — Telehealth: Payer: Self-pay | Admitting: Neurology

## 2012-12-17 DIAGNOSIS — M8500 Fibrous dysplasia (monostotic), unspecified site: Secondary | ICD-10-CM | POA: Insufficient documentation

## 2012-12-17 DIAGNOSIS — R930 Abnormal findings on diagnostic imaging of skull and head, not elsewhere classified: Secondary | ICD-10-CM

## 2012-12-17 DIAGNOSIS — G939 Disorder of brain, unspecified: Secondary | ICD-10-CM

## 2012-12-17 DIAGNOSIS — G40909 Epilepsy, unspecified, not intractable, without status epilepticus: Secondary | ICD-10-CM

## 2012-12-17 HISTORY — DX: Fibrous dysplasia (monostotic), unspecified site: M85.00

## 2012-12-18 DIAGNOSIS — H25049 Posterior subcapsular polar age-related cataract, unspecified eye: Secondary | ICD-10-CM | POA: Diagnosis not present

## 2012-12-18 DIAGNOSIS — H401390 Pigmentary glaucoma, unspecified eye, stage unspecified: Secondary | ICD-10-CM | POA: Diagnosis not present

## 2012-12-18 DIAGNOSIS — H409 Unspecified glaucoma: Secondary | ICD-10-CM | POA: Diagnosis not present

## 2012-12-18 NOTE — Telephone Encounter (Signed)
TC to [facility]. Left message that in Dr. Clarisa Kindred absence I discussed MRI with Dr. Terrace Arabia and she agreed pt needs neurosurgery consult. In attempting to place referral it has already been done by Dellis Anes [facility]

## 2012-12-21 ENCOUNTER — Encounter (HOSPITAL_BASED_OUTPATIENT_CLINIC_OR_DEPARTMENT_OTHER): Payer: Medicare Other

## 2012-12-21 ENCOUNTER — Encounter (HOSPITAL_COMMUNITY): Payer: Self-pay

## 2012-12-21 ENCOUNTER — Telehealth: Payer: Self-pay | Admitting: Neurology

## 2012-12-21 VITALS — BP 96/61 | HR 65 | Temp 97.9°F | Resp 16 | Wt 145.1 lb

## 2012-12-21 DIAGNOSIS — M899 Disorder of bone, unspecified: Secondary | ICD-10-CM | POA: Diagnosis not present

## 2012-12-21 DIAGNOSIS — M949 Disorder of cartilage, unspecified: Secondary | ICD-10-CM | POA: Diagnosis not present

## 2012-12-21 DIAGNOSIS — D696 Thrombocytopenia, unspecified: Secondary | ICD-10-CM

## 2012-12-21 LAB — COMPREHENSIVE METABOLIC PANEL
AST: 23 U/L (ref 0–37)
Albumin: 3.8 g/dL (ref 3.5–5.2)
Alkaline Phosphatase: 71 U/L (ref 39–117)
BUN: 14 mg/dL (ref 6–23)
Chloride: 99 mEq/L (ref 96–112)
Potassium: 4.3 mEq/L (ref 3.5–5.1)
Total Bilirubin: 0.2 mg/dL — ABNORMAL LOW (ref 0.3–1.2)

## 2012-12-21 LAB — CBC WITH DIFFERENTIAL/PLATELET
Basophils Absolute: 0 10*3/uL (ref 0.0–0.1)
Eosinophils Absolute: 0.1 10*3/uL (ref 0.0–0.7)
Eosinophils Relative: 2 % (ref 0–5)
MCH: 32.8 pg (ref 26.0–34.0)
MCHC: 35.2 g/dL (ref 30.0–36.0)
MCV: 93.2 fL (ref 78.0–100.0)
Platelets: 126 10*3/uL — ABNORMAL LOW (ref 150–400)
RDW: 13.3 % (ref 11.5–15.5)

## 2012-12-21 NOTE — Progress Notes (Signed)
[name] Telephone:([phone]   [phone]  OFFICE PROGRESS NOTE  Maria Ruths, MD [address] [place] [address]  DIAGNOSIS: Thrombocytopenia.  INTERVAL HISTORY:   Maria [name] 52 y.o. female returns to the clinic today for scheduled follow up of mild ITP.  Patient was noted after a CT of the brain done following a seizure on 12/05/2012 to have a 9 mm lytic lesion right frontal bone. Follow up MRI done 12/08/2012 Shows a right frontal lobe 9 x 11 mm enhancing lesion. 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 However I reviewed the CT images .   She complains of vague pressure sensation in day back of the head.  Patient doesn't have Mental retardation history and also recurrent seizures.  She has multiple hemangiomas involving the trunk or lower extremities. She has not had any easy bruising or bleeding.  She was accompanied by her mother who provided majority of the history.   MEDICAL HISTORY: Past Medical History  Diagnosis Date  . Seizures   . GERD (gastroesophageal reflux disease)   . Mental retardation     "Mild"    ALLERGIES:  is allergic to codeine.  MEDICATIONS:  Current Outpatient Prescriptions  Medication Sig Dispense Refill  . Calcium Carb-Cholecalciferol (CALCIUM 500 +D) 500-400 MG-UNIT TABS Take 1 tablet by mouth 2 (two) times daily.      . Calcium Carbonate Antacid (TUMS PO) Take 1 tablet by mouth as needed.      Marland Kitchen dexlansoprazole (DEXILANT) 60 MG capsule Take 1 capsule (60 mg total) by mouth daily.  90 capsule  4  . diazepam (VALIUM) 5 MG tablet Take 5 mg by mouth every 6 (six) hours as needed for anxiety.       . divalproex (DEPAKOTE ER) 500 MG 24 hr tablet Take 1 tablet (500 mg total) by mouth 2 (two) times daily.   180 tablet  1  . estradiol (VIVELLE-DOT) 0.075 MG/24HR Place 1 patch onto the skin 2 (two) times a week. On Wednesday and Saturday      . ibuprofen (ADVIL,MOTRIN) 200 MG tablet Take 800 mg by mouth every 6 (six) hours as needed for pain.       Marland Kitchen lacosamide (VIMPAT) 200 MG TABS Take 1 tablet (200 mg total) by mouth 2 (two) times daily.  180 tablet  1  . latanoprost (XALATAN) 0.005 % ophthalmic solution Place 1 drop into both eyes at bedtime.      Marland Kitchen levocetirizine (XYZAL) 5 MG tablet Take 5 mg by mouth every evening.        Marland Kitchen oxybutynin (DITROPAN-XL) 5 MG 24 hr tablet Take 5 mg by mouth daily.      . Pediatric Multiple Vit-C-FA (PEDIATRIC MULTIVITAMIN) chewable tablet Chew 1 tablet by mouth daily.        . peg 3350 powder (MOVIPREP) 100 G SOLR Take 1 kit (100 g total) by mouth once.  1 kit  0   No current facility-administered medications for this visit.    SURGICAL HISTORY:  Past Surgical History  Procedure Laterality Date  . Total abdominal hysterectomy    . Tonsillectomy and adenoidectomy       REVIEW OF SYSTEMS: 14 point review of system is as in the history  above otherwise negative.    PHYSICAL EXAMINATION:  Blood pressure 96/61, pulse 65, temperature 97.9 F (36.6 C), temperature source Oral, resp. rate 16, weight 145 lb 1.6 oz (65.817 kg). GENERAL: No distress. SKIN:  No rashes or significant lesions .  Multiple hemangiomas involving the upper and lower extremities showed approximate aspects and the trunk anteriorly and posteriorly. HEAD: Normocephalic, No masses, lesions, tenderness or abnormalities  EYES: Conjunctiva are pink and non-injected ,pupils are equal and reactive on both sides ENT: External ears normal ,lips, buccal mucosa, and tongue normal and mucous membranes are moist  LUNGS: clear to auscultation , no crackles or wheezes HEART: regular rate & rhythm, no murmurs, no gallops, S1 normal and S2 normal  ABDOMEN: Abdomen soft, non-tender, normal bowel sounds, no  masses or organomegaly and no hepatosplenomegaly palpable EXTREMITIES: No edema, no skin discoloration or tenderness NEURO: No focal motor/sensory deficits.     LABORATORY DATA: Lab Results  Component Value Date   WBC 5.8 07/09/2012   HGB 13.9 07/09/2012   HCT 41.6 07/09/2012   MCV 95.6 07/09/2012   PLT 136* 07/09/2012      Chemistry      Component Value Date/Time   NA 141 10/07/2012 1050   NA 139 08/26/2011 1108   K 3.9 10/07/2012 1050   CL 104 10/07/2012 1050   CO2 27 10/07/2012 1050   BUN 17 12/08/2012 1136   BUN 19 10/07/2012 1050   CREATININE 0.84 12/10/2012 1325   CREATININE 0.84 12/08/2012 1136      Component Value Date/Time   CALCIUM 9.9 10/07/2012 1050   ALKPHOS 59 10/07/2012 1050   AST 15 10/07/2012 1050   ALT 12 10/07/2012 1050   BILITOT 0.5 10/07/2012 1050       RADIOGRAPHIC STUDIES: Ct Head Wo Contrast  12/05/2012   *RADIOLOGY REPORT*  Clinical Data:  Seizure, fall and altered mental status  CT HEAD WITHOUT CONTRAST CT CERVICAL SPINE WITHOUT CONTRAST  Technique:  Multidetector CT imaging of the head and cervical spine was performed following the standard protocol without intravenous contrast.  Multiplanar CT image reconstructions of the cervical spine were also generated.  Comparison:  Prior CT scan of the head 05/14/2009  CT HEAD  Findings: No acute intracranial hemorrhage, acute infarction, mass lesion, mass effect, midline shift or hydrocephalus.  Gray-white differentiation is preserved throughout.  Cerebellar greater than cerebral atrophy is greater than expected for age.  These findings can be seen in patients with history of seizure disorder and in patients due to longstanding antiseizure medication.  Stable focal encephalomalacia in the inferior right frontal lobe.  The globes are intact bilaterally.  Unremarkable orbits.  No focal soft tissue abnormality or scalp hematoma.  The calvarium is intact.  Normal aeration of the bilateral mastoid air cells and visualized paranasal sinuses.  Trace  atherosclerotic calcifications noted in the cavernous carotid arteries bilaterally.  Interval development of a 9 mm focal circumscribed lucency within the diploic space of the right frontal calvarium.  IMPRESSION:  1.  No acute intracranial abnormality.  2.  New 9 mm circumscribed lytic lesion within the diploic space of the right frontal calvarium concerning for metastatic disease, or developing multiple myeloma.  Consider further evaluation with non emergent MRI of the brain with without contrast.  3.  Age advanced cerebellar greater than cerebral atrophy.  CT CERVICAL SPINE  Findings: No acute fracture, malalignment or prevertebral soft tissue swelling.  Multilevel right worse than left facet arthropathy.  Biapical pleural parenchymal scarring.  No additional focal lytic lesions are identified.  Unremarkable thyroid gland. No focal soft tissue abnormality. Multilevel cervical spondylosis most significant at C5-C6.  There is minimal (2 years 3 mm) anterolisthesis of C4 on C5 which is favored to be degenerative.  IMPRESSION:  1.  No acute fracture or malalignment. 2.  Cervical spondylosis and right worse than left facet arthropathy. 3.  Mild degenerative anterolisthesis of C4 on C5 4.  No additional the lytic lesions identified.   Original Report Authenticated By: Malachy Moan, M.D.    Mr Laqueta [name] Wo Contrast  12/08/2012   *RADIOLOGY REPORT*  Clinical Data: New lytic lesion in calvarium. History of mental retardation.  History of epilepsy.  MRI HEAD WITHOUT AND WITH CONTRAST  Technique:  Multiplanar, multiecho pulse sequences of the brain and surrounding structures were obtained according to standard protocol without and with intravenous contrast  Contrast: 13mL MULTIHANCE GADOBENATE DIMEGLUMINE 529 MG/ML IV SOLN  Comparison: CT head 12/05/2012.  Findings: The patient had difficulty remaining motionless for the study.  Images are suboptimal.  Small or subtle lesions could be overlooked.  There is no evidence  for acute infarction, intracranial hemorrhage, mass lesion, hydrocephalus, or extra-axial fluid. Moderately premature cerebellar greater than cerebral atrophy.  This is premature for the patient's age of 89.   Paucity of white matter disease.  Remote right inferior frontal infarct or old contusion, with possible associated blood products on a chronic basis.  Enhancing right frontal bone lesion 9 x 11 mm.  No other similar lesions.  Given the lesion was not present  in 2010, metastatic disease or primary bone lesion such as myeloma not excluded.  No abnormal intracranial enhancement.  Within limits of evaluation on this motion degraded exam, flow voids are maintained in the major intracranial vessels.  Scalp and extracranial soft tissues unremarkable. No midline abnormality.  No acute sinus or mastoid disease.  IMPRESSION: Marked premature atrophy for the patient's age.  9 x 11 mm right frontal diploic space lesion, solitary, not present in 2010.  Neoplastic disease not excluded.  This appears unlikely to represent a venous lake.  Tissue sampling may be needed for further evaluation.  No acute or focal intracranial abnormality.   Original Report Authenticated By: Davonna Belling, M.D.   Ct Abdomen Pelvis W Contrast  12/15/2012   *RADIOLOGY REPORT*  Clinical Data:  Cranial lesion.  Evaluate for metastatic disease.  CT CHEST, ABDOMEN AND PELVIS WITH CONTRAST  Technique:  Multidetector CT imaging of the chest, abdomen and pelvis was performed following the standard protocol during bolus administration of intravenous contrast.  Contrast: , OMNIPAQUE IOHEXOL 300 MG/ML  SOLN  Comparison:  04/07/2010  CT CHEST  Findings:  There is no pleural effusion identified.  Pleural thickening and parenchymal scarring is identified within the right lung base.  5 mm, sub solid nodule is identified within the left upper lobe, image 18/series 3. This is unchanged from 10/12/2009. No new or enlarging pulmonary nodules or masses  identified.  No airspace consolidation identified.  Trachea appears patent and is midline.  The heart size is normal.  No pericardial effusion identified.  There are no enlarged mediastinal or hilar lymph nodes.  No axillary or supraclavicular adenopathy.  Mild spondylosis is identified within the thoracic spine.  There are no worrisome lytic or sclerotic bone lesions identified.  IMPRESSION:  1.  There is a 5 mm subsolid nodule within the left upper lobe which is unchanged from previous exam. No further follow-up of this nodule  is required.  CT ABDOMEN AND PELVIS  Findings:  Within the right hepatic lobe there is a 6 mm low attenuation structure, image 45/series 2.  This is unchanged from 2010 and is likely benign.  Hypodensity within the left hepatic lobe is stable measuring 1 cm, image 52/series 2.  Also unchanged from previous exam.  There is a 6 mm hypoattenuating structure within the right hepatic lobe, image 49/series 2.  This is stable from previous exam.  The gallbladder is normal.  There is no biliary dilatation identified.  Normal appearance of the spleen.  The adrenal glands are both normal.  The right kidney is normal. There is asymmetric atrophy of the left kidney 9 mm cyst arises from the inferior pole of the left kidney.  Urinary bladder appears within normal limits.  The patient is status post previous hysterectomy.  The adnexal structures are unremarkable.  There is mild calcified atherosclerotic disease affecting the abdominal aorta.  No aneurysm.  There is no upper abdominal adenopathy.  No pelvic or inguinal adenopathy identified.  The stomach appears normal.  The small bowel loops are unremarkable.  The normal appearance of the colon.  Review of the visualized osseous structures is significant for mild spondylosis.  IMPRESSION:  1.  No acute findings within the abdomen or the pelvis. 2.  No specific features identified to suggest metastatic disease within the abdomen or pelvis. 3.   Indeterminant low attenuation structures within the liver parenchyma are stable and likely benign.   Original Report Authenticated By: Signa Kell, M.D.     ASSESSMENT: 1.  Thrombocytopenia. Very mild  Await  CBC results today. In the past 3 years platelet count has ranged between 90K-145K.                             2.   Multiple hemangiomas.                             3.   Intracranial mass lesion: multiple myeloma  workup so far has been negative, however plasmacytoma is not unreasonable to think about.  PLAN:  1. CBC CMP today. 2. Referred to neurosurgery for evaluation of  the frontal bone lesion. 3. Return to clinic in 4 weeks.   All questions were satisfactorily answered. Patient knows to call if  any concern arises.  I spent more than 50 % counseling the patient face to face. The total time spent in the appointment was 30 minutes.   Sherral Hammers, MD FACP. Hematology/Oncology.

## 2012-12-21 NOTE — Patient Instructions (Addendum)
[facility] Discharge Instructions  RECOMMENDATIONS MADE BY THE CONSULTANT AND ANY TEST RESULTS WILL BE SENT TO YOUR REFERRING PHYSICIAN.  EXAM FINDINGS BY THE PHYSICIAN TODAY AND SIGNS OR SYMPTOMS TO REPORT TO CLINIC OR PRIMARY PHYSICIAN: Exam and discussion by Dr. Sharia Reeve.  Thinks it's important for you to be seen by a neurosurgeon.  Will check your blood counts today.  MEDICATIONS PRESCRIBED:  none  INSTRUCTIONS GIVEN AND DISCUSSED: Want you to be seen by a neurosurgeon.  Call Tobie Lords, RN  [phone].  SPECIAL INSTRUCTIONS/FOLLOW-UP: Follow-up in 1 month.  Thank you for choosing [facility] to provide your oncology and hematology care.  To afford each patient quality time with our providers, please arrive at least 15 minutes before your scheduled appointment time.  With your help, our goal is to use those 15 minutes to complete the necessary work-up to ensure our physicians have the information they need to help with your evaluation and healthcare recommendations.    Effective January 1st, 2014, we ask that you re-schedule your appointment with our physicians should you arrive 10 or more minutes late for your appointment.  We strive to give you quality time with our providers, and arriving late affects you and other patients whose appointments are after yours.    Again, thank you for choosing [facility].  Our hope is that these requests will decrease the amount of time that you wait before being seen by our physicians.       _____________________________________________________________  Should you have questions after your visit to [facility], please contact our office at [phone] between the hours of 8:30 a.m. and 5:00 p.m.  Voicemails left after 4:30 p.m. will not be returned until the following business day.  For prescription refill requests, have your pharmacy contact our office with your prescription refill  request.

## 2012-12-21 NOTE — Progress Notes (Signed)
Maria [name] presented for labwork. Labs per MD order drawn via Peripheral Line 23 gauge needle inserted in left AC  Good blood return present. Procedure without incident.  Needle removed intact. Patient tolerated procedure well.

## 2012-12-24 ENCOUNTER — Telehealth: Payer: Self-pay | Admitting: Neurology

## 2012-12-24 ENCOUNTER — Telehealth (HOSPITAL_COMMUNITY): Payer: Self-pay

## 2012-12-24 DIAGNOSIS — M899 Disorder of bone, unspecified: Secondary | ICD-10-CM

## 2012-12-24 NOTE — Telephone Encounter (Signed)
Message copied by Stephanie Acre on Thu Dec 24, 2012  5:02 PM ------      Message from: Wynonia Lawman      Created: Thu Dec 24, 2012  3:58 PM       Elyanah's mother, [name], called me today asking for a referral to a neurosurgeon for her daughter's brain lesions.  [name] has talked with Dr. Trey Sailors who told her he no longer did brain surgery, however, recommended either Dr. Maeola Harman or Dr. Coletta Memos.  Mother is very anxious to have Ronica seen immediately by one of the doctors and wants to talk to you.            Sandy P. ------

## 2012-12-24 NOTE — Telephone Encounter (Signed)
[name] was notified that appointment has been made for [facility] to see Dr. Franky Macho (Neurosurgeon)  12/28/12 @ 3pm.  To arrive 20 minutes early.  Verbalized understanding of instructions.

## 2012-12-24 NOTE — Telephone Encounter (Signed)
I called the mother. The patient has had a CT scan and an MRI scan showing a right frontal lytic lesion in the skull. There is no evidence of a brain lesion. I do not think referral to a neurosurgeon is indicated. I asked the mother to cancel his appointment. I will get a bone scan instead. If the bone scan was abnormal, we will send her to an oncologist. If the bone scan is negative, we will follow the lytic lesion over time by serial CT scan.

## 2012-12-25 ENCOUNTER — Telehealth (HOSPITAL_COMMUNITY): Payer: Self-pay | Admitting: Oncology

## 2012-12-25 ENCOUNTER — Telehealth (HOSPITAL_COMMUNITY): Payer: Self-pay

## 2012-12-25 NOTE — Telephone Encounter (Signed)
The bone scan will be canceled, as the patient is already had a workup for multiple myeloma.Marland Kitchen

## 2012-12-25 NOTE — Telephone Encounter (Signed)
I called the mother. The patient is to cancel the neurosurgery appointment, and we will cancel the bone scan. I'll get a CT scan in 3 months.

## 2012-12-25 NOTE — Telephone Encounter (Signed)
Spoke with [name] and relayed the information that was discussed with Dr. Anne Hahn and that Dr. Sharia Reeve our oncologist still recommends that Maria [name] be evaluated by a neurosurgeon.  [name] plans to follow through with the neurosurgical evaluation.  Will call us back if she is unable to get the appointment rescheduled.

## 2012-12-25 NOTE — Telephone Encounter (Signed)
I spoke with Dr. Anne Hahn and explained that we have performed a Multiple myeloma work-up in addition to CT scans of chest, abdomen, pelvis and these were negative for a malignancy.  As a result, we stand by our decision for a referral to neurosurgery for official consultation in the event that this lesion may require resection in the future.  We will defer future imaging of skull/brain to neurosurgery as well since this is not yet proven to be a hematologic/oncologic issue.  Thom Ollinger

## 2012-12-25 NOTE — Telephone Encounter (Signed)
I talked with the oncology physicians. This patient has already been worked up for multiple myeloma. There is no indication for a bone scan at this point. I will make a reminder to recheck the CT scan of the brain in 3-4 months.

## 2012-12-25 NOTE — Telephone Encounter (Signed)
Call back from Edison Pace.  Stated that Dr. Anne Hahn had called and told her to cancel the consult with the Neurosurgeon and wanted to know what to do.  Explained to her that our oncologist's recommendation was for a consult with a neurosurgeon and that she and Keshara would have to make the decision regarding what they wanted to do.

## 2012-12-28 ENCOUNTER — Telehealth (INDEPENDENT_AMBULATORY_CARE_PROVIDER_SITE_OTHER): Payer: Self-pay | Admitting: *Deleted

## 2012-12-28 NOTE — Telephone Encounter (Signed)
msg left on our voicemail Friday at 12:39 pm  Maria [name] states that Maria [name] is having other health issues and feels she cannot go thru [facility] at this time.  Please cancel.  She will let us know later about rescheduling.  She stated she was going to call hospital too.

## 2012-12-28 NOTE — Telephone Encounter (Signed)
[facility] canceled 

## 2012-12-31 ENCOUNTER — Encounter (HOSPITAL_COMMUNITY): Admission: RE | Payer: Self-pay | Source: Ambulatory Visit

## 2012-12-31 ENCOUNTER — Ambulatory Visit (HOSPITAL_COMMUNITY): Admission: RE | Admit: 2012-12-31 | Payer: Medicare Other | Source: Ambulatory Visit | Admitting: Internal Medicine

## 2012-12-31 ENCOUNTER — Encounter (HOSPITAL_COMMUNITY): Payer: Medicare Other

## 2012-12-31 SURGERY — COLONOSCOPY
Anesthesia: Moderate Sedation

## 2013-01-04 ENCOUNTER — Telehealth: Payer: Self-pay | Admitting: Neurology

## 2013-01-04 MED ORDER — DIVALPROEX SODIUM ER 500 MG PO TB24: 500.0000 mg | ORAL_TABLET | Freq: Two times a day (BID) | ORAL | Status: DC

## 2013-01-04 NOTE — Telephone Encounter (Signed)
A six month Rx was already sent to this pharmacy in May.  I have resent the Rx.

## 2013-01-18 ENCOUNTER — Ambulatory Visit (HOSPITAL_COMMUNITY)

## 2013-01-18 DIAGNOSIS — Z803 Family history of malignant neoplasm of breast: Secondary | ICD-10-CM | POA: Diagnosis not present

## 2013-01-18 DIAGNOSIS — Z1231 Encounter for screening mammogram for malignant neoplasm of breast: Secondary | ICD-10-CM | POA: Diagnosis not present

## 2013-01-21 ENCOUNTER — Encounter: Payer: Self-pay | Admitting: Oncology

## 2013-02-09 DIAGNOSIS — Z6825 Body mass index (BMI) 25.0-25.9, adult: Secondary | ICD-10-CM | POA: Diagnosis not present

## 2013-02-09 DIAGNOSIS — J309 Allergic rhinitis, unspecified: Secondary | ICD-10-CM | POA: Diagnosis not present

## 2013-02-09 DIAGNOSIS — T148XXA Other injury of unspecified body region, initial encounter: Secondary | ICD-10-CM | POA: Diagnosis not present

## 2013-02-15 ENCOUNTER — Encounter (HOSPITAL_COMMUNITY): Payer: Medicare Other | Attending: Oncology

## 2013-02-15 DIAGNOSIS — D696 Thrombocytopenia, unspecified: Secondary | ICD-10-CM | POA: Insufficient documentation

## 2013-02-15 LAB — CBC WITH DIFFERENTIAL/PLATELET
Basophils Absolute: 0 10*3/uL (ref 0.0–0.1)
Basophils Relative: 0 % (ref 0–1)
Eosinophils Relative: 2 % (ref 0–5)
Lymphocytes Relative: 44 % (ref 12–46)
Neutro Abs: 2.8 10*3/uL (ref 1.7–7.7)
Platelets: 119 10*3/uL — ABNORMAL LOW (ref 150–400)
RDW: 13.4 % (ref 11.5–15.5)
Smear Review: DECREASED
WBC: 6.3 10*3/uL (ref 4.0–10.5)

## 2013-02-15 NOTE — Progress Notes (Signed)
Labs drawn today for cbc/diff 

## 2013-02-17 ENCOUNTER — Encounter (HOSPITAL_BASED_OUTPATIENT_CLINIC_OR_DEPARTMENT_OTHER): Payer: Medicare Other | Admitting: Oncology

## 2013-02-17 ENCOUNTER — Encounter (HOSPITAL_COMMUNITY): Payer: Self-pay | Admitting: Oncology

## 2013-02-17 VITALS — BP 96/63 | HR 78 | Temp 98.1°F | Resp 16 | Wt 144.6 lb

## 2013-02-17 DIAGNOSIS — D696 Thrombocytopenia, unspecified: Secondary | ICD-10-CM

## 2013-02-17 NOTE — Progress Notes (Signed)
Maria Ruths, MD [address] [place] [address]  Thrombocytopenia - Plan: CBC  CURRENT THERAPY: Observation  INTERVAL HISTORY: Maria [name] 52 y.o. female returns for  regular  visit for followup of thrombocytopenia thus far consistent with low-grade idiopathic thrombocytopenic purpura.  I personally reviewed and went over laboratory results with the patient. Platelets have been stable ranging between 90,000- 142,000 since December 2012.  Her Hgb and WBC have been WNL.    Recently, Maria [name] was found to have a right fontal lobe enhancing lesion of brain/calvarium.  She was worked up from an oncology standpoint for multiple myeloma including CT of CAP.  All of which is negative for malignancy.  We recommended a referral to neurosurgery for further evaluation, but her neurologist, Dr. Anne [name], would prefer to follow the lesion himself with follow-up imaging studies.  Thus we will defer this subject to him.  From a hematologic standpoint, she is doing well.  She denies any blood in her stool, black tarry stool, hematuria, hemoptysis, vaginal bleeding, and spontaneous bleeding, severe bruising, epistaxis, etc.  As mentioned above, her platelets have been very stable.  She reports that a recent increase in her anti-seizure regimen has resulted in prevention of subsequent seizure activity.  Hematologically, she denies any complaints and ROS questioning is negative.   Past Medical History  Diagnosis Date  . Seizures   . GERD (gastroesophageal reflux disease)   . Mental retardation     "Mild"    has CLOSED FRACTURE OF ACROMIAL END OF CLAVICLE; Seizures; GERD (gastroesophageal reflux disease); Thrombocytopenia; Mental retardation; Generalized convulsive epilepsy without mention of intractable epilepsy; Encounter for therapeutic drug monitoring; and Brain lesion on her problem list.     is allergic to codeine.  Ms. [name] had no medications  administered during this visit.  Past Surgical History  Procedure Laterality Date  . Total abdominal hysterectomy    . Tonsillectomy and adenoidectomy      Denies any headaches, dizziness, double vision, fevers, chills, night sweats, nausea, vomiting, diarrhea, constipation, chest pain, heart palpitations, shortness of breath, blood in stool, black tarry stool, urinary pain, urinary burning, urinary frequency, hematuria.   PHYSICAL EXAMINATION  ECOG PERFORMANCE STATUS: 1 - Symptomatic but completely ambulatory  Filed Vitals:   02/17/13 1339  BP: 96/63  Pulse: 78  Temp: 98.1 F (36.7 C)  Resp: 16    GENERAL:alert, no distress, well nourished, well developed, comfortable, cooperative and smiling SKIN: skin color, texture, turgor are normal, no rashes or significant lesions HEAD: Normocephalic, No masses, lesions, tenderness or abnormalities EYES: normal, PERRLA, EOMI, Conjunctiva are pink and non-injected EARS: External ears normal OROPHARYNX:mucous membranes are moist  NECK: supple, no adenopathy, thyroid normal size, non-tender, without nodularity, no stridor, non-tender, trachea midline LYMPH:  no palpable lymphadenopathy BREAST:not examined LUNGS: clear to auscultation  HEART: regular rate & rhythm, no murmurs, no gallops, S1 normal and S2 normal ABDOMEN:abdomen soft, non-tender and normal bowel sounds BACK: Back symmetric, no curvature. EXTREMITIES:less then 2 second capillary refill, no joint deformities, effusion, or inflammation, no skin discoloration, no clubbing, no cyanosis  NEURO: alert & oriented x 3 with fluent speech, gait normal    LABORATORY DATA: CBC    Component Value Date/Time   WBC 6.3 02/15/2013 1142   RBC 4.01 02/15/2013 1142   HGB 12.8 02/15/2013 1142   HCT 38.1 02/15/2013 1142   PLT 119* 02/15/2013 1142   MCV 95.0 02/15/2013 1142   MCH 31.9  02/15/2013 1142   MCHC 33.6 02/15/2013 1142   RDW 13.4 02/15/2013 1142   LYMPHSABS 2.7 02/15/2013 1142    MONOABS 0.5 02/15/2013 1142   EOSABS 0.2 02/15/2013 1142   BASOSABS 0.0 02/15/2013 1142    Results for Maria [name] (MRN [id number]) as of 02/17/2013 13:35  Ref. Range 05/23/2011 13:23 06/24/2011 10:58 08/26/2011 11:08 01/06/2012 11:29 07/09/2012 12:07 12/21/2012 16:35 02/15/2013 11:42  Platelets Latest Range: 150-400 K/uL 90 (L) 142 (L) 127 (L) 125 (L) 136 (L) 126 (L) 119 (L)     RADIOGRAPHIC STUDIES:  12/08/2012  *RADIOLOGY REPORT*  Clinical Data: New lytic lesion in calvarium. History of mental  retardation. History of epilepsy.  MRI HEAD WITHOUT AND WITH CONTRAST  Technique: Multiplanar, multiecho pulse sequences of the brain and  surrounding structures were obtained according to standard protocol  without and with intravenous contrast  Contrast: 13mL MULTIHANCE GADOBENATE DIMEGLUMINE 529 MG/ML IV SOLN  Comparison: CT head 12/05/2012.  Findings: The patient had difficulty remaining motionless for the  study. Images are suboptimal. Small or subtle lesions could be  overlooked.  There is no evidence for acute infarction, intracranial hemorrhage,  mass lesion, hydrocephalus, or extra-axial fluid. Moderately  premature cerebellar greater than cerebral atrophy. This is  premature for the patient's age of 48. Paucity of white matter  disease. Remote right inferior frontal infarct or old contusion,  with possible associated blood products on a chronic basis.  Enhancing right frontal bone lesion 9 x 11 mm. No other similar  lesions. Given the lesion was not present in 2010, metastatic  disease or primary bone lesion such as myeloma not excluded. No  abnormal intracranial enhancement.  Within limits of evaluation on this motion degraded exam, flow  voids are maintained in the major intracranial vessels. Scalp and  extracranial soft tissues unremarkable. No midline abnormality. No  acute sinus or mastoid disease.  IMPRESSION:  Marked premature atrophy for the patient's age.  9 x 11 mm right  frontal diploic space lesion, solitary, not present  in 2010. Neoplastic disease not excluded. This appears unlikely  to represent a venous lake. Tissue sampling may be needed for  further evaluation.  No acute or focal intracranial abnormality.  Original Report Authenticated By: Davonna Belling, MariaD.   12/05/2012  *RADIOLOGY REPORT*  Clinical Data: Seizure, fall and altered mental status  CT HEAD WITHOUT CONTRAST  CT CERVICAL SPINE WITHOUT CONTRAST  Technique: Multidetector CT imaging of the head and cervical spine  was performed following the standard protocol without intravenous  contrast. Multiplanar CT image reconstructions of the cervical  spine were also generated.  Comparison: Prior CT scan of the head 05/14/2009  CT HEAD  Findings: No acute intracranial hemorrhage, acute infarction, mass  lesion, mass effect, midline shift or hydrocephalus. Gray-white  differentiation is preserved throughout. Cerebellar greater than  cerebral atrophy is greater than expected for age. These findings  can be seen in patients with history of seizure disorder and in  patients due to longstanding antiseizure medication. Stable focal  encephalomalacia in the inferior right frontal lobe. The globes  are intact bilaterally. Unremarkable orbits. No focal soft tissue  abnormality or scalp hematoma. The calvarium is intact. Normal  aeration of the bilateral mastoid air cells and visualized  paranasal sinuses. Trace atherosclerotic calcifications noted in  the cavernous carotid arteries bilaterally. Interval development  of a 9 mm focal circumscribed lucency within the diploic space of  the right frontal calvarium.  IMPRESSION:  1. No acute intracranial abnormality.  2. New 9 mm circumscribed lytic lesion within the diploic space of  the right frontal calvarium concerning for metastatic disease, or  developing multiple myeloma. Consider further evaluation with non  emergent MRI of the brain with without  contrast.  3. Age advanced cerebellar greater than cerebral atrophy.  CT CERVICAL SPINE  Findings: No acute fracture, malalignment or prevertebral soft  tissue swelling. Multilevel right worse than left facet  arthropathy. Biapical pleural parenchymal scarring. No additional  focal lytic lesions are identified. Unremarkable thyroid gland.  No focal soft tissue abnormality. Multilevel cervical spondylosis  most significant at C5-C6. There is minimal (2 years 3 mm)  anterolisthesis of C4 on C5 which is favored to be degenerative.  IMPRESSION:  1. No acute fracture or malalignment.  2. Cervical spondylosis and right worse than left facet  arthropathy.  3. Mild degenerative anterolisthesis of C4 on C5  4. No additional the lytic lesions identified.  Original Report Authenticated By: Malachy Moan, MariaD.     ASSESSMENT:  1. Low-grade ITP with stable platelet count x multiple years.  2. History of a seizure disorder on medication.  3. History of tonsillectomy and adenoidectomy.  4. History of total abdominal hysterectomy in the past. 5. Right frontal lobe brain/calvarium lesion, negative oncology work-up including multiple myeloma work-up and occult malignancy work-up with CT CAP. Recommended Neurosurgery consult, but patient's Neurologist, Dr. Anne [name], opposed and he would rather follow lesion with upcoming/repeat imaging study.  Patient Active Problem List   Diagnosis Date Noted  . Brain lesion 12/17/2012  . Generalized convulsive epilepsy without mention of intractable epilepsy 10/06/2012  . Encounter for therapeutic drug monitoring 10/06/2012  . Mental retardation   . Thrombocytopenia 05/23/2011  . Seizures 03/21/2011  . GERD (gastroesophageal reflux disease) 03/21/2011  . CLOSED FRACTURE OF ACROMIAL END OF CLAVICLE 07/13/2008    PLAN:  1. I personally reviewed and went over laboratory results with the patient. 2. Labs in 5 months: CBC 3. Continue follow-up with Dr. Anne [name] as  directed 4. Follow-up with PCP as directed.  5. Signs and symptoms of low platelets discussed with patient and mother.  6. Return in 5 months for follow-up.   THERAPY PLAN:  Hematologically, she is very stable.  We will continue to follow platelets.  All questions were answered. The patient knows to call the clinic with any problems, questions or concerns. We can certainly see the patient much sooner if necessary.  Patient and plan discussed with Dr. Alla German and he is in agreement with the aforementioned.   Emauri Krygier

## 2013-02-17 NOTE — Patient Instructions (Addendum)
.  [facility] Discharge Instructions  RECOMMENDATIONS MADE BY THE CONSULTANT AND ANY TEST RESULTS WILL BE SENT TO YOUR REFERRING PHYSICIAN.  EXAM FINDINGS BY THE PHYSICIAN TODAY AND SIGNS OR SYMPTOMS TO REPORT TO CLINIC OR PRIMARY PHYSICIAN: Exam and findings as discussed by Jenita Seashore PA.   INSTRUCTIONS/FOLLOW-UP: Labs in 5 months and then return to see [place].  Thank you for choosing [facility] to provide your oncology and hematology care.  To afford each patient quality time with our providers, please arrive at least 15 minutes before your scheduled appointment time.  With your help, our goal is to use those 15 minutes to complete the necessary work-up to ensure our physicians have the information they need to help with your evaluation and healthcare recommendations.    Effective January 1st, 2014, we ask that you re-schedule your appointment with our physicians should you arrive 10 or more minutes late for your appointment.  We strive to give you quality time with our providers, and arriving late affects you and other patients whose appointments are after yours.    Again, thank you for choosing [facility].  Our hope is that these requests will decrease the amount of time that you wait before being seen by our physicians.       _____________________________________________________________  Should you have questions after your visit to [facility], please contact our office at [phone] between the hours of 8:30 a.m. and 5:00 p.m.  Voicemails left after 4:30 p.m. will not be returned until the following business day.  For prescription refill requests, have your pharmacy contact our office with your prescription refill request.

## 2013-03-03 ENCOUNTER — Telehealth: Payer: Self-pay | Admitting: Neurology

## 2013-03-03 DIAGNOSIS — R569 Unspecified convulsions: Secondary | ICD-10-CM

## 2013-03-03 DIAGNOSIS — M899 Disorder of bone, unspecified: Secondary | ICD-10-CM

## 2013-03-03 NOTE — Telephone Encounter (Signed)
I called the patient and I talked with her mother. The patient has had a lytic bone lesion in the frontal part of the skull. I'll check another CT scan of the brain to followup with this.

## 2013-03-11 ENCOUNTER — Other Ambulatory Visit: Payer: Medicare Other

## 2013-03-11 ENCOUNTER — Ambulatory Visit
Admission: RE | Admit: 2013-03-11 | Discharge: 2013-03-11 | Disposition: A | Discharge status: Home / Self Care | Payer: Medicare Other | Source: Ambulatory Visit | Attending: Neurology | Admitting: Neurology

## 2013-03-11 DIAGNOSIS — M899 Disorder of bone, unspecified: Secondary | ICD-10-CM

## 2013-03-11 DIAGNOSIS — R9409 Abnormal results of other function studies of central nervous system: Secondary | ICD-10-CM

## 2013-03-11 DIAGNOSIS — R569 Unspecified convulsions: Secondary | ICD-10-CM | POA: Diagnosis not present

## 2013-03-13 ENCOUNTER — Telehealth: Payer: Self-pay | Admitting: Neurology

## 2013-03-13 NOTE — Telephone Encounter (Signed)
I called the patient and I spoke with the mother. The cystic lesion in the skull is stable. CT of the chest and abdomen is unremarkable.

## 2013-03-15 ENCOUNTER — Encounter (INDEPENDENT_AMBULATORY_CARE_PROVIDER_SITE_OTHER): Payer: Self-pay | Admitting: Internal Medicine

## 2013-03-15 ENCOUNTER — Ambulatory Visit (INDEPENDENT_AMBULATORY_CARE_PROVIDER_SITE_OTHER): Payer: Medicare Other | Admitting: Internal Medicine

## 2013-03-15 VITALS — BP 100/62 | HR 64 | Temp 97.7°F | Ht 66.0 in | Wt 144.0 lb

## 2013-03-15 DIAGNOSIS — K219 Gastro-esophageal reflux disease without esophagitis: Secondary | ICD-10-CM | POA: Diagnosis not present

## 2013-03-15 NOTE — Patient Instructions (Signed)
Will schedule a colonoscopy with Dr. Karilyn Cota. OV in 1 yr.

## 2013-03-15 NOTE — Progress Notes (Signed)
Subjective:     Patient ID: Maria [name], female   DOB: 09-10-1960, 52 y.o.   MRN: [id number]  HPI Here today for f/u of her GERD. Her acid reflux is controlled. Her appetite has remained good. No weight loss. BMs are normal. No melena or bright red rectal bleeding. She was scheduled for a colonoscopy in July but cancelled.  She was recently diagnosed with a lytic lesion to her brain. She apparently had a seizure and was evaluated in the ED in July. She underwent a CT which revealed:1. No acute intracranial abnormality.  2. New 9 mm circumscribed lytic lesion within the diploic space of  the right frontal calvarium concerning for metastatic disease, or  developing multiple myeloma. Consider further evaluation with non  emergent MRI of the brain with without contrast.  3. Age advanced cerebellar greater than cerebral atrophy. 12/11/2012 CT abdomen/pelvis with CM: IMPRESSION:  1. No acute findings within the abdomen or the pelvis.  2. No specific features identified to suggest metastatic disease  within the abdomen or pelvis.  3. Indeterminant low attenuation structures within the liver  parenchyma are stable and likely benign.   Her work up for multiple myeloma was negative. Her mother tells me she had a repeat CT of the head and their were no changes. She is followed by Dr.Willis, Neurologist. She is followed by  Dr. Sharia Reeve  idiopathic  thrombocytopenia. She has had 3e seizures in the past 2 weeks. This is the first time she has had a seizure in a long time.  CBC    Component Value Date/Time   WBC 6.3 02/15/2013 1142   RBC 4.01 02/15/2013 1142   HGB 12.8 02/15/2013 1142   HCT 38.1 02/15/2013 1142   PLT 119* 02/15/2013 1142   MCV 95.0 02/15/2013 1142   MCH 31.9 02/15/2013 1142   MCHC 33.6 02/15/2013 1142   RDW 13.4 02/15/2013 1142   LYMPHSABS 2.7 02/15/2013 1142   MONOABS 0.5 02/15/2013 1142   EOSABS 0.2 02/15/2013 1142   BASOSABS 0.0 02/15/2013 1142      Review of Systems see  hpi Current Outpatient Prescriptions  Medication Sig Dispense Refill  . Calcium Carb-Cholecalciferol (CALCIUM 500 +D) 500-400 MG-UNIT TABS Take 1 tablet by mouth 2 (two) times daily.      . Calcium Carbonate Antacid (TUMS PO) Take 1 tablet by mouth as needed.      Marland Kitchen dexlansoprazole (DEXILANT) 60 MG capsule Take 1 capsule (60 mg total) by mouth daily.  90 capsule  4  . diazepam (VALIUM) 5 MG tablet Take 5 mg by mouth every 6 (six) hours as needed for anxiety.       . divalproex (DEPAKOTE ER) 500 MG 24 hr tablet Take 1 tablet (500 mg total) by mouth 2 (two) times daily.  180 tablet  1  . estradiol (VIVELLE-DOT) 0.075 MG/24HR Place 1 patch onto the skin 2 (two) times a week. On Wednesday and Saturday      . ibuprofen (ADVIL,MOTRIN) 200 MG tablet Take 800 mg by mouth every 6 (six) hours as needed for pain.       Marland Kitchen lacosamide (VIMPAT) 200 MG TABS Take 1 tablet (200 mg total) by mouth 2 (two) times daily.  180 tablet  1  . latanoprost (XALATAN) 0.005 % ophthalmic solution Place 1 drop into both eyes at bedtime.      Marland Kitchen levocetirizine (XYZAL) 5 MG tablet Take 5 mg by mouth every evening.        Marland Kitchen  oxybutynin (DITROPAN-XL) 5 MG 24 hr tablet Take 5 mg by mouth daily.      . Pediatric Multiple Vit-C-FA (PEDIATRIC MULTIVITAMIN) chewable tablet Chew 1 tablet by mouth daily.         No current facility-administered medications for this visit.   Past Medical History  Diagnosis Date  . Seizures   . GERD (gastroesophageal reflux disease)   . Mental retardation     "Mild"   Past Surgical History  Procedure Laterality Date  . Total abdominal hysterectomy    . Tonsillectomy and adenoidectomy     Allergies  Allergen Reactions  . Codeine     REACTION: nausea, vomiting        Objective:   Physical Exam  Filed Vitals:   03/15/13 1415  BP: 100/62  Pulse: 64  Temp: 97.7 F (36.5 C)  Height: 5\' 6"  (1.676 m)  Weight: 144 lb (65.318 kg)   Alert and oriented. Skin warm and dry. Oral mucosa is  moist.   . Sclera anicteric, conjunctivae is pink. Thyroid not enlarged. No cervical lymphadenopathy. Lungs clear. Heart regular rate and rhythm.  Abdomen is soft. Bowel sounds are positive. No hepatomegaly. No abdominal masses felt. No tenderness.  No edema to lower extremities. Small cherry red rash to abdomen/arms, slightly raised).      Assessment:    GERD controlled at this time. Her mother states she is doing well. In need of a screening colonoscopy    Plan:    Will follow up in one year with Dr. Karilyn Cota.  Will schedule a colonoscopy with Dr. Karilyn Cota.

## 2013-03-16 ENCOUNTER — Other Ambulatory Visit (INDEPENDENT_AMBULATORY_CARE_PROVIDER_SITE_OTHER): Payer: Self-pay | Admitting: *Deleted

## 2013-03-16 ENCOUNTER — Telehealth (INDEPENDENT_AMBULATORY_CARE_PROVIDER_SITE_OTHER): Payer: Self-pay | Admitting: *Deleted

## 2013-03-16 DIAGNOSIS — Z1211 Encounter for screening for malignant neoplasm of colon: Secondary | ICD-10-CM

## 2013-03-16 MED ORDER — PEG 3350-KCL-NA BICARB-NACL 420 G PO SOLR: 4000.0000 mL | Freq: Once | ORAL | Status: DC

## 2013-03-16 NOTE — Telephone Encounter (Signed)
Patient needs trilyte 

## 2013-03-24 ENCOUNTER — Encounter (HOSPITAL_COMMUNITY): Payer: Self-pay | Admitting: Pharmacy Technician

## 2013-03-31 ENCOUNTER — Encounter (HOSPITAL_COMMUNITY): Payer: Medicare Other | Attending: Oncology

## 2013-03-31 DIAGNOSIS — D696 Thrombocytopenia, unspecified: Secondary | ICD-10-CM | POA: Insufficient documentation

## 2013-03-31 LAB — CBC WITH DIFFERENTIAL/PLATELET
Basophils Absolute: 0 10*3/uL (ref 0.0–0.1)
HCT: 39.6 % (ref 36.0–46.0)
Lymphocytes Relative: 47 % — ABNORMAL HIGH (ref 12–46)
MCH: 31.8 pg (ref 26.0–34.0)
MCHC: 33.8 g/dL (ref 30.0–36.0)
Monocytes Absolute: 0.4 10*3/uL (ref 0.1–1.0)
Monocytes Relative: 7 % (ref 3–12)
Neutro Abs: 2.4 10*3/uL (ref 1.7–7.7)
Platelets: 109 10*3/uL — ABNORMAL LOW (ref 150–400)
RDW: 13 % (ref 11.5–15.5)
WBC: 5.6 10*3/uL (ref 4.0–10.5)

## 2013-03-31 NOTE — Progress Notes (Signed)
Labs drawn today for cbc/diff 

## 2013-04-06 ENCOUNTER — Telehealth: Payer: Self-pay | Admitting: Nurse Practitioner

## 2013-04-06 NOTE — Telephone Encounter (Signed)
called patient to remind of 04/08/13 appt with CM, couldn't be reached, no answer.

## 2013-04-07 ENCOUNTER — Ambulatory Visit (HOSPITAL_COMMUNITY): Admission: RE | Admit: 2013-04-07 | Payer: Medicare Other | Source: Ambulatory Visit | Admitting: Internal Medicine

## 2013-04-07 ENCOUNTER — Other Ambulatory Visit (HOSPITAL_COMMUNITY): Payer: Self-pay | Admitting: Family Medicine

## 2013-04-07 ENCOUNTER — Encounter (HOSPITAL_COMMUNITY): Admission: RE | Payer: Self-pay | Source: Ambulatory Visit

## 2013-04-07 ENCOUNTER — Ambulatory Visit (HOSPITAL_COMMUNITY)
Admission: RE | Admit: 2013-04-07 | Discharge: 2013-04-07 | Disposition: A | Discharge status: Home / Self Care | Payer: Medicare Other | Source: Ambulatory Visit | Attending: Family Medicine | Admitting: Family Medicine

## 2013-04-07 DIAGNOSIS — S8990XA Unspecified injury of unspecified lower leg, initial encounter: Secondary | ICD-10-CM | POA: Diagnosis not present

## 2013-04-07 DIAGNOSIS — T148XXA Other injury of unspecified body region, initial encounter: Secondary | ICD-10-CM | POA: Diagnosis not present

## 2013-04-07 DIAGNOSIS — M259 Joint disorder, unspecified: Secondary | ICD-10-CM | POA: Diagnosis not present

## 2013-04-07 DIAGNOSIS — S8000XA Contusion of unspecified knee, initial encounter: Secondary | ICD-10-CM | POA: Insufficient documentation

## 2013-04-07 DIAGNOSIS — M25569 Pain in unspecified knee: Secondary | ICD-10-CM | POA: Insufficient documentation

## 2013-04-07 DIAGNOSIS — IMO0002 Reserved for concepts with insufficient information to code with codable children: Secondary | ICD-10-CM | POA: Diagnosis not present

## 2013-04-07 DIAGNOSIS — W010XXA Fall on same level from slipping, tripping and stumbling without subsequent striking against object, initial encounter: Secondary | ICD-10-CM | POA: Insufficient documentation

## 2013-04-07 SURGERY — COLONOSCOPY
Anesthesia: Moderate Sedation

## 2013-04-08 ENCOUNTER — Other Ambulatory Visit: Payer: Self-pay | Admitting: Neurology

## 2013-04-08 ENCOUNTER — Encounter (INDEPENDENT_AMBULATORY_CARE_PROVIDER_SITE_OTHER): Payer: Self-pay

## 2013-04-08 ENCOUNTER — Encounter: Payer: Self-pay | Admitting: Nurse Practitioner

## 2013-04-08 ENCOUNTER — Ambulatory Visit (INDEPENDENT_AMBULATORY_CARE_PROVIDER_SITE_OTHER): Payer: Medicare Other | Admitting: Nurse Practitioner

## 2013-04-08 VITALS — BP 105/64 | HR 62 | Ht 65.0 in | Wt 148.0 lb

## 2013-04-08 DIAGNOSIS — F79 Unspecified intellectual disabilities: Secondary | ICD-10-CM | POA: Diagnosis not present

## 2013-04-08 DIAGNOSIS — G40309 Generalized idiopathic epilepsy and epileptic syndromes, not intractable, without status epilepticus: Secondary | ICD-10-CM

## 2013-04-08 DIAGNOSIS — Z5181 Encounter for therapeutic drug level monitoring: Secondary | ICD-10-CM

## 2013-04-08 MED ORDER — LACOSAMIDE 200 MG PO TABS: 200.0000 mg | ORAL_TABLET | Freq: Two times a day (BID) | ORAL | Status: DC

## 2013-04-08 MED ORDER — DIVALPROEX SODIUM ER 500 MG PO TB24: 500.0000 mg | ORAL_TABLET | Freq: Two times a day (BID) | ORAL | Status: DC

## 2013-04-08 NOTE — Progress Notes (Signed)
I have read the note, and I agree with the clinical assessment and plan.  Susumu Hackler KEITH   

## 2013-04-08 NOTE — Patient Instructions (Signed)
Continue Depakote and then at the current doses Refill for 6 months Followup in 6 months

## 2013-04-08 NOTE — Telephone Encounter (Signed)
[facility]'s [facility] at [phone].

## 2013-04-08 NOTE — Progress Notes (Signed)
[facility]  Maria [name]: Maria Maria [name] DOB: 06/18/1960   REASON FOR VISIT: Followup seizure disorder   HISTORY OF PRESENT ILLNESS: Maria Maria [name], 52 year old returns for followup with her mother. Last seen in this office 10/06/2012.  She has a history of intractable seizure disorder. She's had a total of 6 seizures in the last 6 months which is down from 9 at previous visit.  She is on Depakote and Vimpat has been added. She had recent CBC done in October  by oncology. She has a low platelet count. She has had a workup for multiple myeloma. She has a lytic bone lesion in the front part of the skull, CT of the head 03/11/2013 was stable. She has been on Lamictal in the past resulting in  a skin rash.   REVIEW OF SYSTEMS: Full 14 system review of systems performed and notable only for:  Constitutional: N/A  Cardiovascular: N/A  Ear/Nose/Throat: N/A  Skin: N/A  Eyes: N/A  Respiratory: N/A  Gastroitestinal: N/A  Hematology/Lymphatic: N/A  Endocrine:  hot flashes  Musculoskeletal:N/A  Allergy/Immunology: N/A  Neurological: Seizure  Psychiatric: N/A   ALLERGIES: Allergies  Allergen Reactions  . Codeine     REACTION: nausea, vomiting    HOME MEDICATIONS: Outpatient Prescriptions Prior to Visit  Medication Sig Dispense Refill  . Calcium Carb-Cholecalciferol (CALCIUM 500 +D) 500-400 MG-UNIT TABS Take 1 tablet by mouth 2 (two) times daily.      . Calcium Carbonate Antacid (TUMS PO) Take 1 tablet by mouth as needed (heartburn).       Marland Kitchen dexlansoprazole (DEXILANT) 60 MG capsule Take 1 capsule (60 mg total) by mouth daily.  90 capsule  4  . diazepam (VALIUM) 5 MG tablet Take 2.5-5 mg by mouth every 6 (six) hours as needed for anxiety.       . divalproex (DEPAKOTE ER) 500 MG 24 hr tablet Take 1 tablet (500 mg total) by mouth 2 (two) times daily.  180 tablet  1  . estradiol (VIVELLE-DOT) 0.075 MG/24HR Place 1 patch onto the skin 2 (two) times a week. On Wednesday and  Saturday      . ibuprofen (ADVIL,MOTRIN) 200 MG tablet Take 800 mg by mouth every 6 (six) hours as needed for pain.       Marland Kitchen lacosamide (VIMPAT) 200 MG TABS Take 1 tablet (200 mg total) by mouth 2 (two) times daily.  180 tablet  1  . latanoprost (XALATAN) 0.005 % ophthalmic solution Place 1 drop into both eyes at bedtime.      Marland Kitchen levocetirizine (XYZAL) 5 MG tablet Take 5 mg by mouth every evening.        Marland Kitchen oxybutynin (DITROPAN-XL) 5 MG 24 hr tablet Take 5 mg by mouth daily.      . Pediatric Multiple Vit-C-FA (PEDIATRIC MULTIVITAMIN) chewable tablet Chew 1 tablet by mouth daily.        . polyethylene glycol-electrolytes (NULYTELY/GOLYTELY) 420 G solution Take 4,000 mLs by mouth once.  4000 mL  0   No facility-administered medications prior to visit.    PAST MEDICAL HISTORY: Past Medical History  Diagnosis Date  . Seizures   . GERD (gastroesophageal reflux disease)   . Mental retardation     "Mild"    PAST SURGICAL HISTORY: Past Surgical History  Procedure Laterality Date  . Total abdominal hysterectomy    . Tonsillectomy and adenoidectomy      FAMILY HISTORY: Family History  Problem Relation Age of Onset  . High  Cholesterol Mother   . High blood pressure Mother   . Diabetes Father     SOCIAL HISTORY: History   Social History  . Marital Status: Single    Spouse Name: N/A    Number of Children: 0  . Years of Education: N/A   Occupational History  .     Social History Main Topics  . Smoking status: Former Smoker    Types: Cigarettes    Quit date: 05/22/2006  . Smokeless tobacco: Never Used  . Alcohol Use: No  . Drug Use: No  . Sexual Activity: No     Comment: Hysterectomy   Other Topics Concern  . Not on file   Social History Narrative   Maria [name] is single and lives with her mother Maria Maria [name]. Advised Maria [name] of provider's approval for requested procedure, as well as any comments/instructions from provider.       Provided Maria [name] w/ verbal  instructions concerning pre-, intra- and post-procedure preparation and instructions.      Maria [name] verbalized understanding of the above.       Maria [name] has also been mailed a letter containing the following instructions               PHYSICAL EXAM  Filed Vitals:   04/08/13 1404  BP: 105/64  Pulse: 62  Height: 5\' 5"  (1.651 m)  Weight: 148 lb (67.132 kg)   Body mass index is 24.63 kg/(m^2).  Generalized: Well developed, in no acute distress  Neurological examination   Mentation: Alert oriented to time, place, history taking. Mild MR. Follows all commands speech and language fluent  Cranial nerve II-XII: Fundoscopic exam reveals flat disc margins.Pupils were equal round reactive to light extraocular movements were full, visual field were full on confrontational test. Facial sensation and strength were normal. hearing was intact to finger rubbing bilaterally. Uvula tongue midline. head turning and shoulder shrug and were normal and symmetric.Tongue protrusion into cheek strength was normal. Motor: normal bulk and tone, full strength in the BUE, BLE, apraxia noted with the use of extremities Sensory: normal and symmetric to light touch, pinprick, and  vibration  Coordination: finger-nose-finger, heel-to-shin bilaterally, no dysmetria Reflexes: Brachioradialis 2/2, biceps 2/2, triceps 2/2, patellar 2/2, Achilles 2/2, plantar responses were flexor bilaterally. Gait and Station: Rising up from seated position without assistance, normal stance,  moderate stride, good arm swing, smooth turning, able to perform tiptoe, and heel walking without difficulty. Tandem gait stable  DIAGNOSTIC DATA (LABS, IMAGING, TESTING) - I reviewed Maria [name] records, labs, notes, testing and imaging myself where available.  Lab Results  Component Value Date   WBC 5.6 03/31/2013   HGB 13.4 03/31/2013   HCT 39.6 03/31/2013   MCV 94.1 03/31/2013   PLT 109* 03/31/2013      Component Value Date/Time   NA  134* 12/21/2012 1635   NA 141 10/07/2012 1050   K 4.3 12/21/2012 1635   CL 99 12/21/2012 1635   CO2 27 12/21/2012 1635   GLUCOSE 94 12/21/2012 1635   GLUCOSE 80 10/07/2012 1050   BUN 14 12/21/2012 1635   BUN 19 10/07/2012 1050   CREATININE 0.78 12/21/2012 1635   CREATININE 0.84 12/10/2012 1325   CALCIUM 9.7 12/21/2012 1635   PROT 6.9 12/21/2012 1635   PROT 6.4 10/07/2012 1050   ALBUMIN 3.8 12/21/2012 1635   AST 23 12/21/2012 1635   ALT 25 12/21/2012 1635   ALKPHOS 71 12/21/2012 1635   BILITOT 0.2* 12/21/2012 1635   GFRNONAA >90 12/21/2012 1635  GFRAA >90 12/21/2012 1635   ASSESSMENT AND PLAN  52 y.o. year old female  has a past medical history of Seizures; GERD (gastroesophageal reflux disease); and Mental retardation and low grade idiopathic thrombocytopenia. here to followup for seizure disorder. The addition of Vimpat has decreased her seizure frequency tremendously.  Continue Depakote and Vimpat  at the current doses Refill for 6 months Followup in 6 months Nilda Riggs, [facility], [facility], APRN  [facility] [address] [place], [place] [address] [phone]

## 2013-04-12 DIAGNOSIS — H1045 Other chronic allergic conjunctivitis: Secondary | ICD-10-CM | POA: Diagnosis not present

## 2013-07-13 DIAGNOSIS — H401390 Pigmentary glaucoma, unspecified eye, stage unspecified: Secondary | ICD-10-CM | POA: Diagnosis not present

## 2013-07-13 DIAGNOSIS — H25049 Posterior subcapsular polar age-related cataract, unspecified eye: Secondary | ICD-10-CM | POA: Diagnosis not present

## 2013-07-13 DIAGNOSIS — H409 Unspecified glaucoma: Secondary | ICD-10-CM | POA: Diagnosis not present

## 2013-07-19 DIAGNOSIS — J019 Acute sinusitis, unspecified: Secondary | ICD-10-CM | POA: Diagnosis not present

## 2013-07-19 DIAGNOSIS — IMO0002 Reserved for concepts with insufficient information to code with codable children: Secondary | ICD-10-CM | POA: Diagnosis not present

## 2013-07-19 DIAGNOSIS — J029 Acute pharyngitis, unspecified: Secondary | ICD-10-CM | POA: Diagnosis not present

## 2013-07-20 ENCOUNTER — Other Ambulatory Visit (HOSPITAL_COMMUNITY): Payer: Medicare Other

## 2013-07-22 ENCOUNTER — Ambulatory Visit (HOSPITAL_COMMUNITY): Payer: Medicare Other | Admitting: Oncology

## 2013-07-27 ENCOUNTER — Other Ambulatory Visit (HOSPITAL_COMMUNITY): Payer: Medicare Other

## 2013-07-28 ENCOUNTER — Encounter (HOSPITAL_COMMUNITY): Payer: Medicare Other | Attending: Oncology

## 2013-07-28 DIAGNOSIS — D696 Thrombocytopenia, unspecified: Secondary | ICD-10-CM | POA: Diagnosis not present

## 2013-07-28 LAB — CBC
HCT: 39.9 % (ref 36.0–46.0)
Hemoglobin: 13.5 g/dL (ref 12.0–15.0)
MCH: 32.5 pg (ref 26.0–34.0)
MCHC: 33.8 g/dL (ref 30.0–36.0)
MCV: 96.1 fL (ref 78.0–100.0)
PLATELETS: 164 10*3/uL (ref 150–400)
RBC: 4.15 MIL/uL (ref 3.87–5.11)
RDW: 13.9 % (ref 11.5–15.5)
WBC: 6 10*3/uL (ref 4.0–10.5)

## 2013-07-28 NOTE — Progress Notes (Signed)
Labs drawn today for cbc 

## 2013-07-29 ENCOUNTER — Ambulatory Visit (HOSPITAL_COMMUNITY): Payer: Medicare Other | Admitting: Oncology

## 2013-08-01 HISTORY — PX: OTHER SURGICAL HISTORY: SHX169

## 2013-08-11 NOTE — Progress Notes (Signed)
MCGOUGH,WILLIAM M, MD [address] [place] [address]  Thrombocytopenia - Plan: CBC, Platelet antibodies pnl(drct+indrct)  CURRENT THERAPY:Observation   INTERVAL HISTORY: [name] 53 y.o. female returns for  regular  visit for followup of thrombocytopenia thus far consistent with low-grade idiopathic thrombocytopenic purpura.   I personally reviewed and went over laboratory results with the patient.  The results are noted within this dictation.  Platelets have been stable ranging between 90,000- 142,000 since December 2012. Her Hgb and WBC have been WNL.   Patient has a lytic lesion in brain that is stable according to October 2014 CT of head, measuring 9 x 13m in the right frontal calvarium.  This is followed by Dr. WJannifer Franklin  Our recommendations have been dictated previously.  Will defer follow-up to Dr. WJannifer Franklin   She is undergoing oral surgery in [place] with grafting of gum tissue on the mandible aspect of oral cavity.  The right side was completed and she is due for a left sided procedure.    From a hematologic standpoint, SLarrisais doing well.  She denies  Any complaints and ROS questioning is negative.    Past Medical History  Diagnosis Date  . Seizures   . GERD (gastroesophageal reflux disease)   . Mental retardation     "Mild"    has CLOSED FRACTURE OF ACROMIAL END OF CLAVICLE; Seizures; GERD (gastroesophageal reflux disease); Thrombocytopenia; Mental retardation; Generalized convulsive epilepsy without mention of intractable epilepsy; Encounter for therapeutic drug monitoring; and Brain lesion on her problem list.     is allergic to codeine and lamictal.  Ms. FClousehad no medications administered during this visit.  Past Surgical History  Procedure Laterality Date  . Total abdominal hysterectomy    . Tonsillectomy and adenoidectomy    . Skin graft to gum Right 08/2013    Denies any headaches, dizziness, double vision,  fevers, chills, night sweats, nausea, vomiting, diarrhea, constipation, chest pain, heart palpitations, shortness of breath, blood in stool, black tarry stool, urinary pain, urinary burning, urinary frequency, hematuria.   PHYSICAL EXAMINATION  ECOG PERFORMANCE STATUS: 1 - Symptomatic but completely ambulatory  Filed Vitals:   08/12/13 1100  BP: 111/64  Pulse: 70  Temp: 98.1 F (36.7 C)  Resp: 14    GENERAL:alert, no distress, well nourished, well developed, comfortable, cooperative and smiling SKIN: skin color, texture, turgor are normal, no rashes or significant lesions HEAD: Normocephalic, No masses, lesions, tenderness or abnormalities EYES: normal, PERRLA, EOMI, Conjunctiva are pink and non-injected EARS: External ears normal OROPHARYNX:mucous membranes are moist  NECK: supple, trachea midline LYMPH:  not examined BREAST:not examined LUNGS: not examined HEART: not examined ABDOMEN:not examined BACK: Back symmetric, no curvature. EXTREMITIES:no skin discoloration, no clubbing, no cyanosis  NEURO: alert & oriented x 3 with fluent speech, gait normal   LABORATORY DATA: CBC    Component Value Date/Time   WBC 6.0 07/28/2013 1137   RBC 4.15 07/28/2013 1137   HGB 13.5 07/28/2013 1137   HCT 39.9 07/28/2013 1137   PLT 164 07/28/2013 1137   MCV 96.1 07/28/2013 1137   MCH 32.5 07/28/2013 1137   MCHC 33.8 07/28/2013 1137   RDW 13.9 07/28/2013 1137   LYMPHSABS 2.6 03/31/2013 1124   MONOABS 0.4 03/31/2013 1124   EOSABS 0.2 03/31/2013 1124   BASOSABS 0.0 03/31/2013 1124     ASSESSMENT:  1. Low-grade ITP with stable platelet count x multiple years.  2. History of a seizure  disorder on medication.  3. History of tonsillectomy and adenoidectomy.  4. History of total abdominal hysterectomy in the past.  5. Right frontal lobe brain/calvarium lesion, negative oncology work-up including multiple myeloma work-up and occult malignancy work-up with CT CAP. Recommended Neurosurgery consult,  but patient's Neurologist, Dr. Jannifer Franklin, would rather follow the lesion with repeat imaging studies. 6. Undergoing oral surgery in [place], requiring grafting of gum tissue.  Patient Active Problem List   Diagnosis Date Noted  . Brain lesion 12/17/2012  . Generalized convulsive epilepsy without mention of intractable epilepsy 10/06/2012  . Encounter for therapeutic drug monitoring 10/06/2012  . Mental retardation   . Thrombocytopenia 05/23/2011  . Seizures 03/21/2011  . GERD (gastroesophageal reflux disease) 03/21/2011  . CLOSED FRACTURE OF ACROMIAL END OF CLAVICLE 07/13/2008     PLAN:  1. I personally reviewed and went over laboratory results with the patient.  The results are noted within this dictation. 2. Continue follow-up with Dr. Jannifer Franklin as directed 3. Follow-up with PCP as directed. 4. Labs in 6 months: CBC, platelet antibodies 5. Signs and symptoms of low platelets discussed with patient and mother. 6. Return in 6 months for follow-up.   THERAPY PLAN:  Most recent CBC is WNL and therefore we will continue to follow along with repeat labs in 6 months.  All questions were answered. The patient knows to call the clinic with any problems, questions or concerns. We can certainly see the patient much sooner if necessary.  Patient and plan discussed with Dr. Farrel Gobble and he is in agreement with the aforementioned.   KEFALAS,THOMAS

## 2013-08-12 ENCOUNTER — Encounter (HOSPITAL_COMMUNITY): Payer: Self-pay | Admitting: Oncology

## 2013-08-12 ENCOUNTER — Encounter (HOSPITAL_COMMUNITY): Payer: Medicare Other | Attending: Oncology | Admitting: Oncology

## 2013-08-12 VITALS — BP 111/64 | HR 70 | Temp 98.1°F | Resp 14 | Wt 150.3 lb

## 2013-08-12 DIAGNOSIS — D693 Immune thrombocytopenic purpura: Secondary | ICD-10-CM | POA: Diagnosis not present

## 2013-08-12 DIAGNOSIS — D696 Thrombocytopenia, unspecified: Secondary | ICD-10-CM | POA: Insufficient documentation

## 2013-08-12 NOTE — Patient Instructions (Signed)
[facility] Discharge Instructions  RECOMMENDATIONS MADE BY THE CONSULTANT AND ANY TEST RESULTS WILL BE SENT TO YOUR REFERRING PHYSICIAN.  EXAM FINDINGS BY THE PHYSICIAN TODAY AND SIGNS OR SYMPTOMS TO REPORT TO CLINIC OR PRIMARY PHYSICIAN: Exam and findings as discussed by Robynn Pane, PA - C.  Labs were completely normal.  Report unusual bruising or bleeding.  MEDICATIONS PRESCRIBED:  none  INSTRUCTIONS/FOLLOW-UP: Follow-up with lab work and office visit in 6 months.  Thank you for choosing [facility] to provide your oncology and hematology care.  To afford each patient quality time with our providers, please arrive at least 15 minutes before your scheduled appointment time.  With your help, our goal is to use those 15 minutes to complete the necessary work-up to ensure our physicians have the information they need to help with your evaluation and healthcare recommendations.    Effective January 1st, 2014, we ask that you re-schedule your appointment with our physicians should you arrive 10 or more minutes late for your appointment.  We strive to give you quality time with our providers, and arriving late affects you and other patients whose appointments are after yours.    Again, thank you for choosing [facility].  Our hope is that these requests will decrease the amount of time that you wait before being seen by our physicians.       _____________________________________________________________  Should you have questions after your visit to [facility], please contact our office at [phone] between the hours of 8:30 a.m. and 5:00 p.m.  Voicemails left after 4:30 p.m. will not be returned until the following business day.  For prescription refill requests, have your pharmacy contact our office with your prescription refill request.

## 2013-08-13 ENCOUNTER — Other Ambulatory Visit (INDEPENDENT_AMBULATORY_CARE_PROVIDER_SITE_OTHER): Payer: Self-pay | Admitting: Internal Medicine

## 2013-08-24 ENCOUNTER — Other Ambulatory Visit: Payer: Self-pay | Admitting: Neurology

## 2013-08-24 MED ORDER — LACOSAMIDE 200 MG PO TABS: 200.0000 mg | ORAL_TABLET | Freq: Two times a day (BID) | ORAL | Status: DC

## 2013-08-24 NOTE — Telephone Encounter (Signed)
Patient's mother calling to state that patient will need refill of Vimpat to last her until her appointment in May.

## 2013-08-24 NOTE — Telephone Encounter (Signed)
Rx signed and faxed.

## 2013-09-09 ENCOUNTER — Telehealth: Payer: Self-pay | Admitting: Neurology

## 2013-09-09 DIAGNOSIS — R930 Abnormal findings on diagnostic imaging of skull and head, not elsewhere classified: Secondary | ICD-10-CM

## 2013-09-09 NOTE — Telephone Encounter (Signed)
Patient's mother calling and wants to have another CT scan ordered for patient. Please call and advise.

## 2013-09-09 NOTE — Telephone Encounter (Signed)
Pt's mother calling requesting another CT scan be done. Please advise

## 2013-09-15 DIAGNOSIS — IMO0002 Reserved for concepts with insufficient information to code with codable children: Secondary | ICD-10-CM | POA: Diagnosis not present

## 2013-09-15 DIAGNOSIS — G40909 Epilepsy, unspecified, not intractable, without status epilepticus: Secondary | ICD-10-CM | POA: Diagnosis not present

## 2013-09-15 DIAGNOSIS — N3941 Urge incontinence: Secondary | ICD-10-CM | POA: Diagnosis not present

## 2013-09-16 ENCOUNTER — Ambulatory Visit
Admission: RE | Admit: 2013-09-16 | Discharge: 2013-09-16 | Disposition: A | Discharge status: Home / Self Care | Payer: Medicare Other | Source: Ambulatory Visit | Attending: Neurology | Admitting: Neurology

## 2013-09-16 DIAGNOSIS — R9409 Abnormal results of other function studies of central nervous system: Secondary | ICD-10-CM | POA: Diagnosis not present

## 2013-09-16 DIAGNOSIS — R930 Abnormal findings on diagnostic imaging of skull and head, not elsewhere classified: Secondary | ICD-10-CM | POA: Diagnosis not present

## 2013-09-17 ENCOUNTER — Telehealth: Payer: Self-pay | Admitting: Neurology

## 2013-09-17 NOTE — Telephone Encounter (Signed)
  I called patient. The cyst in the skull appears to be unchanged from the prior study done in July 2014.  CT of the head, 09/17/2013:  Impression   Abnormal CT scan of the brain.showing stable cystic bony  lesion in the right frontal region as compared with prior MRI dated 12/08/12  and CT scan 03/11/13.

## 2013-09-22 DIAGNOSIS — G40802 Other epilepsy, not intractable, without status epilepticus: Secondary | ICD-10-CM | POA: Diagnosis not present

## 2013-09-22 DIAGNOSIS — Z79899 Other long term (current) drug therapy: Secondary | ICD-10-CM | POA: Diagnosis not present

## 2013-10-06 ENCOUNTER — Ambulatory Visit (INDEPENDENT_AMBULATORY_CARE_PROVIDER_SITE_OTHER): Payer: Medicare Other | Admitting: Nurse Practitioner

## 2013-10-06 ENCOUNTER — Telehealth: Payer: Self-pay | Admitting: Nurse Practitioner

## 2013-10-06 ENCOUNTER — Encounter (INDEPENDENT_AMBULATORY_CARE_PROVIDER_SITE_OTHER): Payer: Self-pay

## 2013-10-06 ENCOUNTER — Encounter: Payer: Self-pay | Admitting: Nurse Practitioner

## 2013-10-06 VITALS — BP 106/65 | HR 62 | Ht 65.5 in | Wt 145.0 lb

## 2013-10-06 DIAGNOSIS — G40309 Generalized idiopathic epilepsy and epileptic syndromes, not intractable, without status epilepticus: Secondary | ICD-10-CM

## 2013-10-06 DIAGNOSIS — Z5181 Encounter for therapeutic drug level monitoring: Secondary | ICD-10-CM | POA: Diagnosis not present

## 2013-10-06 DIAGNOSIS — R569 Unspecified convulsions: Secondary | ICD-10-CM

## 2013-10-06 NOTE — Progress Notes (Signed)
[facility]  PATIENT: Maria [name] DOB: August 29, 1960   REASON FOR VISIT: Followup for seizure disorder intractable   HISTORY OF PRESENT ILLNESS: Maria [name], 53 -year-old female returns for followup. She was last seen in this office 04/08/2013. She has a history of intractable seizure disorder. She's had a total of 1 seizure in the last 6 months which is down from 6 at previous visit. She is on Depakote and Vimpat has been added. She had recent CBC done in February  by oncology. She has a low platelet count. She has had a workup for multiple myeloma. She has a lytic bone lesion in the front part of the skull, CT of the head 03/11/2013 was stable. She has been on Lamictal in the past resulting in a skin rash. She returns for reevaluation   REVIEW OF SYSTEMS: Full 14 system review of systems performed and notable only for those listed, all others are neg:  Constitutional: N/A  Cardiovascular: N/A  Ear/Nose/Throat: N/A  Skin: N/A  Eyes: Blurred vision Respiratory: N/A  Gastroitestinal: N/A  Hematology/Lymphatic: N/A  Endocrine: Intolerance to heat  Musculoskeletal:N/A  Allergy/Immunology: N/A  Neurological: Seizure  Psychiatric: N/A Sleep : NA   ALLERGIES: Allergies  Allergen Reactions  . Codeine     REACTION: nausea, vomiting  . Lamictal [Lamotrigine]     Skin rash    HOME MEDICATIONS: Outpatient Prescriptions Prior to Visit  Medication Sig Dispense Refill  . Calcium Carb-Cholecalciferol (CALCIUM 500 +D) 500-400 MG-UNIT TABS Take 1 tablet by mouth 2 (two) times daily.      . Calcium Carbonate Antacid (TUMS PO) Take 1 tablet by mouth as needed (heartburn).       . DEXILANT 60 MG capsule TAKE 1 CAPSULE DAILY  90 capsule  3  . divalproex (DEPAKOTE ER) 500 MG 24 hr tablet Take 1 tablet (500 mg total) by mouth 2 (two) times daily.  180 tablet  3  . estradiol (VIVELLE-DOT) 0.075 MG/24HR Place 1 patch onto the skin 2 (two) times a week. On Wednesday and  Saturday      . ibuprofen (ADVIL,MOTRIN) 200 MG tablet Take 800 mg by mouth every 6 (six) hours as needed for pain.       Marland Kitchen lacosamide (VIMPAT) 200 MG TABS tablet Take 1 tablet (200 mg total) by mouth 2 (two) times daily.  180 tablet  1  . latanoprost (XALATAN) 0.005 % ophthalmic solution Place 1 drop into both eyes at bedtime.      Marland Kitchen levocetirizine (XYZAL) 5 MG tablet Take 5 mg by mouth every evening.        Marland Kitchen oxybutynin (DITROPAN-XL) 5 MG 24 hr tablet Take 5 mg by mouth daily.      . Pediatric Multiple Vit-C-FA (PEDIATRIC MULTIVITAMIN) chewable tablet Chew 1 tablet by mouth daily.        . diazepam (VALIUM) 5 MG tablet Take 2.5-5 mg by mouth every 6 (six) hours as needed for anxiety.        No facility-administered medications prior to visit.    PAST MEDICAL HISTORY: Past Medical History  Diagnosis Date  . Seizures   . GERD (gastroesophageal reflux disease)   . Mental retardation     "Mild"    PAST SURGICAL HISTORY: Past Surgical History  Procedure Laterality Date  . Total abdominal hysterectomy    . Tonsillectomy and adenoidectomy    . Skin graft to gum Right 08/2013    FAMILY HISTORY: Family History  Problem Relation Age  of Onset  . High Cholesterol Mother   . High blood pressure Mother   . Diabetes Father     SOCIAL HISTORY: History   Social History  . Marital Status: Single    Spouse Name: N/A    Number of Children: 0  . Years of Education: N/A   Occupational History  .     Social History Main Topics  . Smoking status: Former Smoker    Types: Cigarettes    Quit date: 05/22/2006  . Smokeless tobacco: Never Used  . Alcohol Use: No  . Drug Use: No  . Sexual Activity: No     Comment: Hysterectomy   Other Topics Concern  . Not on file   Social History Narrative   Patient is single and lives with her mother Maria [name] patient. Advised patient of provider's approval for requested procedure, as well as any comments/instructions from provider.        Provided patient w/ verbal instructions concerning pre-, intra- and post-procedure preparation and instructions.      Patient verbalized understanding of the above.       Patient has also been mailed a letter containing the following instructions               PHYSICAL EXAM  Filed Vitals:   10/06/13 1414  BP: 106/65  Pulse: 62  Height: 5' 5.5" (1.664 m)  Weight: 145 lb (65.772 kg)   Body mass index is 23.75 kg/(m^2).  Generalized: Well developed, in no acute distress   Neurological examination   Mentation: Alert oriented to time, place, history taking. MIld MR. Follows all commands speech and language fluent  Cranial nerve II-XII: Pupils were equal round reactive to light extraocular movements were full, visual field were full on confrontational test. Facial sensation and strength were normal. hearing was intact to finger rubbing bilaterally. Uvula tongue midline. head turning and shoulder shrug were normal and symmetric.Tongue protrusion into cheek strength was normal. Motor: normal bulk and tone, full strength in the BUE, BLE,  No focal weakness Sensory: normal and symmetric to light touch, pinprick, and  vibration  Coordination: finger-nose-finger, heel-to-shin bilaterally, no dysmetria Reflexes: Brachioradialis 2/2, biceps 2/2, triceps 2/2, patellar 2/2, Achilles 2/2, plantar responses were flexor bilaterally. Gait and Station: Rising up from seated position without assistance, normal stance,  moderate stride, good arm swing, smooth turning, able to perform tiptoe, and heel walking without difficulty. Tandem gait is steady  DIAGNOSTIC DATA (LABS, IMAGING, TESTING) - I reviewed patient records, labs, notes, testing and imaging myself where available.  Lab Results  Component Value Date   WBC 6.0 07/28/2013   HGB 13.5 07/28/2013   HCT 39.9 07/28/2013   MCV 96.1 07/28/2013   PLT 164 07/28/2013      Component Value Date/Time   NA 134* 12/21/2012 1635   NA 141 10/07/2012 1050     K 4.3 12/21/2012 1635   CL 99 12/21/2012 1635   CO2 27 12/21/2012 1635   GLUCOSE 94 12/21/2012 1635   GLUCOSE 80 10/07/2012 1050   BUN 14 12/21/2012 1635   BUN 19 10/07/2012 1050   CREATININE 0.78 12/21/2012 1635   CREATININE 0.84 12/10/2012 1325   CALCIUM 9.7 12/21/2012 1635   PROT 6.9 12/21/2012 1635   PROT 6.4 10/07/2012 1050   ALBUMIN 3.8 12/21/2012 1635   AST 23 12/21/2012 1635   ALT 25 12/21/2012 1635   ALKPHOS 71 12/21/2012 1635   BILITOT 0.2* 12/21/2012 1635   GFRNONAA >90 12/21/2012 1635  GFRAA >90 12/21/2012 1635        ASSESSMENT AND PLAN  53 y.o. year old female  has a past medical history of Seizures;  and Mental retardation. here to followup. Seizure frequency has decreased significantly since patient has been on Vimpat.   Continue Depakote at current dose, does not need refills Continue Vimpat at current dose,  does not need refills Please fax recent labs [phone] done at PCP Followup in 6 months Dennie Bible, [facility], [facility], [facility] [address], [place], [place] [address] [phone]

## 2013-10-06 NOTE — Patient Instructions (Signed)
Continue Depakote at current dose, does not need refills Continue Vimpat at current dose,  does not need refills Please fax recent labs [phone] Followup in 6 months

## 2013-10-06 NOTE — Progress Notes (Signed)
I have read the note, and I agree with the clinical assessment and plan.  Khali Perella K Claudene Gatliff   

## 2013-10-07 DIAGNOSIS — Z124 Encounter for screening for malignant neoplasm of cervix: Secondary | ICD-10-CM | POA: Diagnosis not present

## 2013-10-07 DIAGNOSIS — Z01419 Encounter for gynecological examination (general) (routine) without abnormal findings: Secondary | ICD-10-CM | POA: Diagnosis not present

## 2013-10-07 NOTE — Telephone Encounter (Signed)
Spoke to patient's mother. She wanted to know if patient will continue to have CT scans every 6 months. Advised mother this can be discussed in upcoming 6 month f/u visit. Mother agreed.

## 2013-11-25 DIAGNOSIS — H25049 Posterior subcapsular polar age-related cataract, unspecified eye: Secondary | ICD-10-CM | POA: Diagnosis not present

## 2013-11-25 DIAGNOSIS — H2589 Other age-related cataract: Secondary | ICD-10-CM | POA: Diagnosis not present

## 2013-12-08 ENCOUNTER — Encounter (INDEPENDENT_AMBULATORY_CARE_PROVIDER_SITE_OTHER): Payer: Self-pay | Admitting: *Deleted

## 2013-12-16 DIAGNOSIS — H2589 Other age-related cataract: Secondary | ICD-10-CM | POA: Diagnosis not present

## 2013-12-16 DIAGNOSIS — H25049 Posterior subcapsular polar age-related cataract, unspecified eye: Secondary | ICD-10-CM | POA: Diagnosis not present

## 2013-12-27 DIAGNOSIS — M899 Disorder of bone, unspecified: Secondary | ICD-10-CM | POA: Diagnosis not present

## 2013-12-27 DIAGNOSIS — N959 Unspecified menopausal and perimenopausal disorder: Secondary | ICD-10-CM | POA: Diagnosis not present

## 2013-12-27 DIAGNOSIS — M949 Disorder of cartilage, unspecified: Secondary | ICD-10-CM | POA: Diagnosis not present

## 2014-01-27 ENCOUNTER — Telehealth: Payer: Self-pay | Admitting: Neurology

## 2014-02-01 NOTE — Telephone Encounter (Signed)
Noted  

## 2014-02-03 ENCOUNTER — Other Ambulatory Visit (HOSPITAL_COMMUNITY): Payer: Medicare Other

## 2014-02-10 ENCOUNTER — Ambulatory Visit (HOSPITAL_COMMUNITY): Payer: Medicare Other | Admitting: Oncology

## 2014-02-10 ENCOUNTER — Other Ambulatory Visit (HOSPITAL_COMMUNITY): Payer: Medicare Other

## 2014-02-14 ENCOUNTER — Encounter (HOSPITAL_COMMUNITY): Payer: Medicare Other | Attending: Oncology

## 2014-02-14 DIAGNOSIS — F79 Unspecified intellectual disabilities: Secondary | ICD-10-CM | POA: Diagnosis not present

## 2014-02-14 DIAGNOSIS — IMO0002 Reserved for concepts with insufficient information to code with codable children: Secondary | ICD-10-CM | POA: Diagnosis not present

## 2014-02-14 DIAGNOSIS — D696 Thrombocytopenia, unspecified: Secondary | ICD-10-CM

## 2014-02-14 DIAGNOSIS — J029 Acute pharyngitis, unspecified: Secondary | ICD-10-CM | POA: Diagnosis not present

## 2014-02-14 DIAGNOSIS — D693 Immune thrombocytopenic purpura: Secondary | ICD-10-CM | POA: Diagnosis not present

## 2014-02-14 LAB — CBC
HEMATOCRIT: 38.8 % (ref 36.0–46.0)
Hemoglobin: 13.2 g/dL (ref 12.0–15.0)
MCH: 32.1 pg (ref 26.0–34.0)
MCHC: 34 g/dL (ref 30.0–36.0)
MCV: 94.4 fL (ref 78.0–100.0)
PLATELETS: 124 10*3/uL — AB (ref 150–400)
RBC: 4.11 MIL/uL (ref 3.87–5.11)
RDW: 13.2 % (ref 11.5–15.5)
WBC: 8 10*3/uL (ref 4.0–10.5)

## 2014-02-14 NOTE — Progress Notes (Signed)
Maria [name]'s reason for visit today are for labs as scheduled per MD orders.  Venipuncture performed with a 23 gauge butterfly needle to L Antecubital.  Maria [name] tolerated venipuncture well and without incident; questions were answered and patient was discharged.

## 2014-02-16 NOTE — Progress Notes (Signed)
-  No show, letter sent-   Robynn Pane 02/18/2014

## 2014-02-18 ENCOUNTER — Ambulatory Visit (HOSPITAL_COMMUNITY): Payer: Medicare Other | Admitting: Oncology

## 2014-02-22 ENCOUNTER — Telehealth: Payer: Self-pay | Admitting: Nurse Practitioner

## 2014-02-22 NOTE — Telephone Encounter (Signed)
Patient's mother calling to request when patient is going to have a CT scan, please return call and advise.

## 2014-02-22 NOTE — Telephone Encounter (Signed)
Called patient back, no answer or voice mail. Patient was last seen on 10/06/13, next appointment is scheduled for 04/21/14 at 3 pm with NP CM. Per phone note dated for 10/06/13 patient's mother was instructed to speak with NP CM at her next office visit about CT scans. Will try to contact patient again shortly.

## 2014-02-27 NOTE — Progress Notes (Signed)
Leonides Grills, MD [place] [address]  Thrombocytopenia  Mental retardation  CURRENT THERAPY: Observation  INTERVAL HISTORY: Maria [name] 53 y.o. female returns for  regular  visit for followup of thrombocytopenia thus far consistent with low-grade idiopathic thrombocytopenic purpura.  I personally reviewed and went over laboratory results with the patient.  The results are noted within this dictation.  Her platelet count remains very stable.  She notes some intermittent discomfort of the #3 toes of the left foot.  She reports that it is a cramping discomfort.  It resolves spontaneously.  She notes that it has been occuring for some time now > 3 months.  No abnormalities noted of physical exam.  She reports a left upper calf "spot" on the lateral aspect that changes color.  On physical exam, there is not pigmentation changes other than a healing ecchymosis.  I do note a small depression on palpation in that area.  She denies any pain.  During conversation regarding this "spot" the patient and her mother got in a little verbal argument regarding the timeline of this spot.    The patient's mother asked about a repeat CT scan of the brain versus osseous lesion noted in the past.  Our recommendations were to follow-up with neurosurgery, but Dr. Jannifer Franklin (neurologist), disagreed and wanted to follow with serial CT imaging.  I will defer to him.  Hematologically, the patient denies any complaints and ROS questioning is negative.  She denies any blod in stool, black tarry stool, hematuria,     Past Medical History  Diagnosis Date  . Seizures   . GERD (gastroesophageal reflux disease)   . Mental retardation     "Mild"    has CLOSED FRACTURE OF ACROMIAL END OF CLAVICLE; Seizures; GERD (gastroesophageal reflux disease); Thrombocytopenia; Mental retardation; Generalized convulsive epilepsy without mention of intractable epilepsy; Encounter for therapeutic drug  monitoring; and Brain lesion on her problem list.     is allergic to codeine and lamictal.  Ms. Quizhpi does not currently have medications on file.  Past Surgical History  Procedure Laterality Date  . Total abdominal hysterectomy    . Tonsillectomy and adenoidectomy    . Skin graft to gum Right 08/2013  . Cataract extraction      both eyes  . Mouth surgery      Denies any headaches, dizziness, double vision, fevers, chills, night sweats, nausea, vomiting, diarrhea, constipation, chest pain, heart palpitations, shortness of breath, blood in stool, black tarry stool, urinary pain, urinary burning, urinary frequency, hematuria.   PHYSICAL EXAMINATION  ECOG PERFORMANCE STATUS: 1 - Symptomatic but completely ambulatory  Filed Vitals:   03/01/14 1300  BP: 124/66  Pulse: 66  Temp: 97.8 F (36.6 C)  Resp: 18    GENERAL:alert, no distress, well nourished, well developed, comfortable, cooperative, smiling and mental impairment SKIN: skin color, texture, turgor are normal, no rashes or significant lesions HEAD: Normocephalic, No masses, lesions, tenderness or abnormalities EYES: normal, PERRLA, EOMI, Conjunctiva are pink and non-injected EARS: External ears normal OROPHARYNX:mucous membranes are moist  NECK: supple, no adenopathy, thyroid normal size, non-tender, without nodularity, no stridor, non-tender, trachea midline LYMPH:  no palpable lymphadenopathy BREAST:not examined LUNGS: clear to auscultation and percussion HEART: regular rate & rhythm, no murmurs and no gallops ABDOMEN:abdomen soft, non-tender, normal bowel sounds and no masses or organomegaly BACK: Back symmetric, no curvature. EXTREMITIES:less then 2 second capillary refill, no joint deformities, effusion, or inflammation, no skin discoloration, no clubbing, no  cyanosis.  Left lateral calf ecchymosis with depression on palpation.  Toe inspection does not reveal any erythema, pain on palpation, or decreased  ROM. NEURO: alert & oriented x 3 with fluent speech, no focal motor/sensory deficits, gait normal   LABORATORY DATA: CBC    Component Value Date/Time   WBC 8.0 02/14/2014 1324   RBC 4.11 02/14/2014 1324   HGB 13.2 02/14/2014 1324   HCT 38.8 02/14/2014 1324   PLT 124* 02/14/2014 1324   MCV 94.4 02/14/2014 1324   MCH 32.1 02/14/2014 1324   MCHC 34.0 02/14/2014 1324   RDW 13.2 02/14/2014 1324   LYMPHSABS 2.6 03/31/2013 1124   MONOABS 0.4 03/31/2013 1124   EOSABS 0.2 03/31/2013 1124   BASOSABS 0.0 03/31/2013 1124     ASSESSMENT:  1. Low-grade ITP with stable platelet count x multiple years.  2. History of a seizure disorder on medication.  3. History of tonsillectomy and adenoidectomy.  4. History of total abdominal hysterectomy in the past.  5. Right frontal lobe brain/calvarium lesion, negative oncology work-up including multiple myeloma work-up and occult malignancy work-up with CT CAP. Recommended Neurosurgery consult, but patient's Neurologist, Dr. Willis, would rather follow the lesion with repeat imaging studies. 6. Left #3 toe cramping 7. Left lateral calf healing ecchymosis  Patient Active Problem List   Diagnosis Date Noted  . Brain lesion 12/17/2012  . Generalized convulsive epilepsy without mention of intractable epilepsy 10/06/2012  . Encounter for therapeutic drug monitoring 10/06/2012  . Mental retardation   . Thrombocytopenia 05/23/2011  . Seizures 03/21/2011  . GERD (gastroesophageal reflux disease) 03/21/2011  . CLOSED FRACTURE OF ACROMIAL END OF CLAVICLE 07/13/2008     PLAN:  1. I personally reviewed and went over laboratory results with the patient.  The results are noted within this dictation. 2. Continue follow-up with Dr. Willis as directed  3. Follow-up with PCP as directed.  4. Labs in 6 months: CBC diff, platelet antibodies, anticardiolipin antibodies 5. Signs and symptoms of low platelets discussed with patient and mother.  6. Patient defers influenza  vaccine at this time with plans to get it with her mother in December 2016.  7. Return in 6 months for follow-up.   THERAPY PLAN:  CBC is extremely stable x many years. We will continue to monitor labs.  No intervention is needed at this time.   All questions were answered. The patient knows to call the clinic with any problems, questions or concerns. We can certainly see the patient much sooner if necessary.  Patient and plan discussed with Dr. Gregory Formanek and he is in agreement with the aforementioned.   KEFALAS,THOMAS 03/01/2014      

## 2014-03-01 ENCOUNTER — Encounter (HOSPITAL_BASED_OUTPATIENT_CLINIC_OR_DEPARTMENT_OTHER): Payer: Medicare Other | Admitting: Oncology

## 2014-03-01 ENCOUNTER — Encounter (HOSPITAL_COMMUNITY): Payer: Self-pay | Admitting: Oncology

## 2014-03-01 VITALS — BP 124/66 | HR 66 | Temp 97.8°F | Resp 18 | Wt 144.8 lb

## 2014-03-01 DIAGNOSIS — F79 Unspecified intellectual disabilities: Secondary | ICD-10-CM | POA: Diagnosis not present

## 2014-03-01 DIAGNOSIS — D696 Thrombocytopenia, unspecified: Secondary | ICD-10-CM

## 2014-03-01 NOTE — Patient Instructions (Signed)
Maria [name] Discharge Instructions  RECOMMENDATIONS MADE BY THE CONSULTANT AND ANY TEST RESULTS WILL BE SENT TO YOUR REFERRING PHYSICIAN.  We will see you in 6 months for follow up. Please call for any questions.  Thank you for choosing [facility] to provide your oncology and hematology care.  To afford each patient quality time with our providers, please arrive at least 15 minutes before your scheduled appointment time.  With your help, our goal is to use those 15 minutes to complete the necessary work-up to ensure our physicians have the information they need to help with your evaluation and healthcare recommendations.    Effective January 1st, 2014, we ask that you re-schedule your appointment with our physicians should you arrive 10 or more minutes late for your appointment.  We strive to give you quality time with our providers, and arriving late affects you and other patients whose appointments are after yours.    Again, thank you for choosing [facility].  Our hope is that these requests will decrease the amount of time that you wait before being seen by our physicians.       _____________________________________________________________  Should you have questions after your visit to [facility], please contact our office at [phone] between the hours of 8:30 a.m. and 4:30 p.m.  Voicemails left after 4:30 p.m. will not be returned until the following business day.  For prescription refill requests, have your pharmacy contact our office with your prescription refill request.    _______________________________________________________________  We hope that we have given you very good care.  You may receive a patient satisfaction survey in the mail, please complete it and return it as soon as possible.  We value your feedback!  _______________________________________________________________  Have you asked about our STAR  program?  STAR stands for Survivorship Training and Rehabilitation, and this is a nationally recognized cancer care program that focuses on survivorship and rehabilitation.  Cancer and cancer treatments may cause problems, such as, pain, making you feel tired and keeping you from doing the things that you need or want to do. Cancer rehabilitation can help. Our goal is to reduce these troubling effects and help you have the best quality of life possible.  You may receive a survey from a nurse that asks questions about your current state of health.  Based on the survey results, all eligible patients will be referred to the [facility] program for an evaluation so we can better serve you!  A frequently asked questions sheet is available upon request.

## 2014-03-07 ENCOUNTER — Encounter (INDEPENDENT_AMBULATORY_CARE_PROVIDER_SITE_OTHER): Payer: Self-pay | Admitting: Internal Medicine

## 2014-03-07 ENCOUNTER — Ambulatory Visit (INDEPENDENT_AMBULATORY_CARE_PROVIDER_SITE_OTHER): Payer: Medicare Other | Admitting: Internal Medicine

## 2014-03-07 VITALS — BP 116/78 | HR 68 | Temp 97.8°F | Ht 66.0 in | Wt 145.8 lb

## 2014-03-07 DIAGNOSIS — K219 Gastro-esophageal reflux disease without esophagitis: Secondary | ICD-10-CM

## 2014-03-07 MED ORDER — DEXLANSOPRAZOLE 60 MG PO CPDR: DELAYED_RELEASE_CAPSULE | ORAL | Status: DC

## 2014-03-07 NOTE — Patient Instructions (Signed)
Do not take more than 2 Advils at a time and no more than 6 per day; always take with food or snack. Keep track of how often you take TUMS.

## 2014-03-07 NOTE — Progress Notes (Signed)
Presenting complaint;  Followup for GERD.  Subjective:  Patient is 53 year old Caucasian female with multiple medical problems who is here for scheduled visit regarding GERD. She is accompanied by her mother. She was last seen one year ago. She has intermittent heartburn but she is not sure Hall often. She is taking TUMS. She took 3 times last week for heartburn. She denies dysphagia nausea vomiting sore throat or chronic cough. She is not losing weight. Her bowels move daily. She denies melena or rectal bleeding. She complains of frequent headache. She is on Advil but she does not take more than 2 at a time. She is not having any side effects with this medication. Her mother states she had bone density study which showed loss compared to last year. Issues mother is very concerned about small lesion involving the diploic space in the right frontal region. She is followed by Dr. Floyde Parkins.    Current Medications: Outpatient Encounter Prescriptions as of 03/07/2014  Medication Sig  . Calcium Carb-Cholecalciferol (CALCIUM 500 +D) 500-400 MG-UNIT TABS Take 1 tablet by mouth 2 (two) times daily.  . Calcium Carbonate Antacid (TUMS PO) Take 1 tablet by mouth as needed (heartburn).   . DEXILANT 60 MG capsule TAKE 1 CAPSULE DAILY  . divalproex (DEPAKOTE ER) 500 MG 24 hr tablet Take 1 tablet (500 mg total) by mouth 2 (two) times daily.  Marland Kitchen estradiol (VIVELLE-DOT) 0.075 MG/24HR Place 1 patch onto the skin 2 (two) times a week. On Wednesday and Saturday  . ibuprofen (ADVIL,MOTRIN) 200 MG tablet Take 800 mg by mouth every 6 (six) hours as needed for pain.   Marland Kitchen lacosamide (VIMPAT) 200 MG TABS tablet Take 1 tablet (200 mg total) by mouth 2 (two) times daily.  Marland Kitchen latanoprost (XALATAN) 0.005 % ophthalmic solution Place 1 drop into both eyes at bedtime.  Marland Kitchen levocetirizine (XYZAL) 5 MG tablet Take 5 mg by mouth every evening.    Marland Kitchen oxybutynin (DITROPAN-XL) 5 MG 24 hr tablet Take 5 mg by mouth daily.  . Pediatric  Multiple Vit-C-FA (PEDIATRIC MULTIVITAMIN) chewable tablet Chew 1 tablet by mouth daily.       Objective: Blood pressure 116/78, pulse 68, temperature 97.8 F (36.6 C), height 5\' 6"  (1.676 m), weight 145 lb 12.8 oz (66.134 kg). Patient is alert and in no acute distress. Conjunctiva is pink. Sclera is nonicteric Oropharyngeal mucosa is normal. No neck masses or thyromegaly noted. Cardiac exam with regular rhythm normal S1 and S2. No murmur or gallop noted. Lungs are clear to auscultation. Abdomen is full but soft and nontender without organomegaly or masses.  No LE edema or clubbing noted.    Assessment:  #1. GERD. Patient has been on chronic PPI therapy. She is still having intermittent heartburn but it is difficult to know how often. Unfortunately history is not very helpful. If she is developing bone loss  May consider dropping the dose to half. Therefore need to monitor frequency of Tums use. She has undergone at least 3 EGDs with dilation. Last one was in May 2011 and biopsy was negative for Barrett's.   Plan:  Patient will continue Dexilant at 60 mg by mouth every morning. Patient advised to keep written record of OTC use. Physical visit in 6 months.

## 2014-03-15 ENCOUNTER — Ambulatory Visit (INDEPENDENT_AMBULATORY_CARE_PROVIDER_SITE_OTHER): Payer: Medicare Other | Admitting: Internal Medicine

## 2014-03-15 DIAGNOSIS — Z803 Family history of malignant neoplasm of breast: Secondary | ICD-10-CM | POA: Diagnosis not present

## 2014-03-15 DIAGNOSIS — Z1231 Encounter for screening mammogram for malignant neoplasm of breast: Secondary | ICD-10-CM | POA: Diagnosis not present

## 2014-03-16 ENCOUNTER — Observation Stay (HOSPITAL_COMMUNITY): Payer: Medicare Other

## 2014-03-16 ENCOUNTER — Encounter (HOSPITAL_COMMUNITY): Payer: Self-pay | Admitting: Emergency Medicine

## 2014-03-16 ENCOUNTER — Observation Stay (HOSPITAL_COMMUNITY)
Admission: EM | Admit: 2014-03-16 | Discharge: 2014-03-17 | Disposition: A | Discharge status: Home / Self Care | Payer: Medicare Other | Attending: Internal Medicine | Admitting: Internal Medicine

## 2014-03-16 DIAGNOSIS — K219 Gastro-esophageal reflux disease without esophagitis: Secondary | ICD-10-CM | POA: Diagnosis not present

## 2014-03-16 DIAGNOSIS — N261 Atrophy of kidney (terminal): Secondary | ICD-10-CM | POA: Diagnosis not present

## 2014-03-16 DIAGNOSIS — G40909 Epilepsy, unspecified, not intractable, without status epilepticus: Secondary | ICD-10-CM | POA: Diagnosis not present

## 2014-03-16 DIAGNOSIS — Z79899 Other long term (current) drug therapy: Secondary | ICD-10-CM | POA: Diagnosis not present

## 2014-03-16 DIAGNOSIS — D696 Thrombocytopenia, unspecified: Secondary | ICD-10-CM | POA: Diagnosis not present

## 2014-03-16 DIAGNOSIS — R569 Unspecified convulsions: Secondary | ICD-10-CM

## 2014-03-16 DIAGNOSIS — R079 Chest pain, unspecified: Secondary | ICD-10-CM | POA: Diagnosis not present

## 2014-03-16 DIAGNOSIS — R1010 Upper abdominal pain, unspecified: Secondary | ICD-10-CM | POA: Diagnosis not present

## 2014-03-16 DIAGNOSIS — Z87891 Personal history of nicotine dependence: Secondary | ICD-10-CM | POA: Diagnosis not present

## 2014-03-16 DIAGNOSIS — K760 Fatty (change of) liver, not elsewhere classified: Secondary | ICD-10-CM | POA: Diagnosis not present

## 2014-03-16 DIAGNOSIS — T426X1A Poisoning by other antiepileptic and sedative-hypnotic drugs, accidental (unintentional), initial encounter: Secondary | ICD-10-CM | POA: Insufficient documentation

## 2014-03-16 DIAGNOSIS — I517 Cardiomegaly: Secondary | ICD-10-CM | POA: Diagnosis not present

## 2014-03-16 DIAGNOSIS — Z5181 Encounter for therapeutic drug level monitoring: Secondary | ICD-10-CM

## 2014-03-16 DIAGNOSIS — T50904A Poisoning by unspecified drugs, medicaments and biological substances, undetermined, initial encounter: Secondary | ICD-10-CM | POA: Diagnosis not present

## 2014-03-16 DIAGNOSIS — F79 Unspecified intellectual disabilities: Secondary | ICD-10-CM | POA: Insufficient documentation

## 2014-03-16 DIAGNOSIS — R109 Unspecified abdominal pain: Secondary | ICD-10-CM | POA: Diagnosis not present

## 2014-03-16 DIAGNOSIS — I6781 Acute cerebrovascular insufficiency: Secondary | ICD-10-CM | POA: Diagnosis not present

## 2014-03-16 DIAGNOSIS — R42 Dizziness and giddiness: Secondary | ICD-10-CM | POA: Diagnosis not present

## 2014-03-16 DIAGNOSIS — T443X1A Poisoning by other parasympatholytics [anticholinergics and antimuscarinics] and spasmolytics, accidental (unintentional), initial encounter: Secondary | ICD-10-CM | POA: Diagnosis present

## 2014-03-16 DIAGNOSIS — G939 Disorder of brain, unspecified: Secondary | ICD-10-CM | POA: Insufficient documentation

## 2014-03-16 HISTORY — DX: Chest pain, unspecified: R07.9

## 2014-03-16 LAB — TROPONIN I
Troponin I: <0.3 ng/mL (ref <0.30)
Troponin I: <0.3 ng/mL (ref <0.30)

## 2014-03-16 LAB — VALPROIC ACID LEVEL: Valproic Acid Lvl: 81.9 ug/mL (ref 50.0–100.0)

## 2014-03-16 LAB — CBC WITH DIFFERENTIAL/PLATELET
Basophils Absolute: 0 10*3/uL (ref 0.0–0.1)
Basophils Relative: 0 % (ref 0–1)
EOS ABS: 0 10*3/uL (ref 0.0–0.7)
EOS PCT: 0 % (ref 0–5)
HCT: 37.3 % (ref 36.0–46.0)
Hemoglobin: 12.7 g/dL (ref 12.0–15.0)
LYMPHS ABS: 2.1 10*3/uL (ref 0.7–4.0)
Lymphocytes Relative: 21 % (ref 12–46)
MCH: 32.1 pg (ref 26.0–34.0)
MCHC: 34 g/dL (ref 30.0–36.0)
MCV: 94.2 fL (ref 78.0–100.0)
Monocytes Absolute: 1 10*3/uL (ref 0.1–1.0)
Monocytes Relative: 10 % (ref 3–12)
Neutro Abs: 6.9 10*3/uL (ref 1.7–7.7)
Neutrophils Relative %: 69 % (ref 43–77)
PLATELETS: ADEQUATE 10*3/uL (ref 150–400)
RBC: 3.96 MIL/uL (ref 3.87–5.11)
RDW: 13.3 % (ref 11.5–15.5)
Smear Review: ADEQUATE
WBC: 10 10*3/uL (ref 4.0–10.5)

## 2014-03-16 LAB — BASIC METABOLIC PANEL
Anion gap: 11 (ref 5–15)
BUN: 21 mg/dL (ref 6–23)
CALCIUM: 9.2 mg/dL (ref 8.4–10.5)
CO2: 27 meq/L (ref 19–32)
CREATININE: 0.83 mg/dL (ref 0.50–1.10)
Chloride: 103 mEq/L (ref 96–112)
GFR calc Af Amer: >90 mL/min (ref >90)
GFR, EST NON AFRICAN AMERICAN: 80 mL/min — AB (ref >90)
GLUCOSE: 128 mg/dL — AB (ref 70–99)
Potassium: 4.2 mEq/L (ref 3.7–5.3)
Sodium: 141 mEq/L (ref 137–147)

## 2014-03-16 LAB — HEPATIC FUNCTION PANEL
ALBUMIN: 3.6 g/dL (ref 3.5–5.2)
ALT: 18 U/L (ref 0–35)
AST: 17 U/L (ref 0–37)
Alkaline Phosphatase: 57 U/L (ref 39–117)
Bilirubin, Direct: <0.2 mg/dL (ref 0.0–0.3)
Total Bilirubin: 0.3 mg/dL (ref 0.3–1.2)
Total Protein: 6.7 g/dL (ref 6.0–8.3)

## 2014-03-16 LAB — LIPASE, BLOOD: LIPASE: 23 U/L (ref 11–59)

## 2014-03-16 LAB — SALICYLATE LEVEL

## 2014-03-16 LAB — TSH: TSH: 2.66 u[IU]/mL (ref 0.350–4.500)

## 2014-03-16 LAB — ACETAMINOPHEN LEVEL: Acetaminophen (Tylenol), Serum: <15 ug/mL (ref 10–30)

## 2014-03-16 MED ORDER — ONDANSETRON HCL 4 MG PO TABS: 4.0000 mg | ORAL_TABLET | Freq: Four times a day (QID) | ORAL | Status: DC | PRN

## 2014-03-16 MED ORDER — SODIUM CHLORIDE 0.9 % IJ SOLN
3.0000 mL | Freq: Two times a day (BID) | INTRAMUSCULAR | Status: DC
  Administered 2014-03-16 – 2014-03-17 (×2): 3 mL via INTRAVENOUS

## 2014-03-16 MED ORDER — CALCIUM CARBONATE-VITAMIN D 500-200 MG-UNIT PO TABS
1.0000 | ORAL_TABLET | Freq: Two times a day (BID) | ORAL | Status: DC
  Administered 2014-03-16 – 2014-03-17 (×3): 1 via ORAL
  Filled 2014-03-16 (×3): qty 1

## 2014-03-16 MED ORDER — LATANOPROST 0.005 % OP SOLN
1.0000 [drp] | Freq: Every day | OPHTHALMIC | Status: DC
  Administered 2014-03-16 (×2): 1 [drp] via OPHTHALMIC
  Filled 2014-03-16: qty 2.5

## 2014-03-16 MED ORDER — OXYBUTYNIN CHLORIDE ER 5 MG PO TB24
5.0000 mg | ORAL_TABLET | Freq: Every day | ORAL | Status: DC
  Administered 2014-03-16 – 2014-03-17 (×2): 5 mg via ORAL
  Filled 2014-03-16 (×2): qty 1

## 2014-03-16 MED ORDER — ALBUTEROL SULFATE (2.5 MG/3ML) 0.083% IN NEBU: 2.5000 mg | INHALATION_SOLUTION | RESPIRATORY_TRACT | Status: DC | PRN

## 2014-03-16 MED ORDER — DIVALPROEX SODIUM ER 500 MG PO TB24
500.0000 mg | ORAL_TABLET | Freq: Two times a day (BID) | ORAL | Status: DC
  Administered 2014-03-16 – 2014-03-17 (×3): 500 mg via ORAL
  Filled 2014-03-16 (×3): qty 1

## 2014-03-16 MED ORDER — LEVOCETIRIZINE DIHYDROCHLORIDE 5 MG PO TABS: 5.0000 mg | ORAL_TABLET | Freq: Every evening | ORAL | Status: DC

## 2014-03-16 MED ORDER — PANTOPRAZOLE SODIUM 40 MG PO TBEC
40.0000 mg | DELAYED_RELEASE_TABLET | Freq: Every day | ORAL | Status: DC
  Administered 2014-03-16 – 2014-03-17 (×2): 40 mg via ORAL
  Filled 2014-03-16 (×2): qty 1

## 2014-03-16 MED ORDER — ONDANSETRON HCL 4 MG/2ML IJ SOLN: 4.0000 mg | Freq: Four times a day (QID) | INTRAMUSCULAR | Status: DC | PRN

## 2014-03-16 MED ORDER — LACOSAMIDE 50 MG PO TABS
200.0000 mg | ORAL_TABLET | Freq: Two times a day (BID) | ORAL | Status: DC
  Administered 2014-03-16 – 2014-03-17 (×3): 200 mg via ORAL
  Filled 2014-03-16 (×3): qty 4

## 2014-03-16 MED ORDER — ASPIRIN EC 81 MG PO TBEC
81.0000 mg | DELAYED_RELEASE_TABLET | Freq: Every day | ORAL | Status: DC
  Administered 2014-03-16 – 2014-03-17 (×2): 81 mg via ORAL
  Filled 2014-03-16 (×2): qty 1

## 2014-03-16 MED ORDER — MORPHINE SULFATE 2 MG/ML IJ SOLN: 1.0000 mg | INTRAMUSCULAR | Status: DC | PRN

## 2014-03-16 MED ORDER — LORATADINE 10 MG PO TABS
10.0000 mg | ORAL_TABLET | Freq: Every day | ORAL | Status: DC
  Administered 2014-03-16: 10 mg via ORAL
  Filled 2014-03-16: qty 1

## 2014-03-16 MED ORDER — ACETAMINOPHEN 325 MG PO TABS
650.0000 mg | ORAL_TABLET | Freq: Four times a day (QID) | ORAL | Status: DC | PRN
  Filled 2014-03-16: qty 2

## 2014-03-16 MED ORDER — PANTOPRAZOLE SODIUM 40 MG IV SOLR
40.0000 mg | Freq: Once | INTRAVENOUS | Status: AC
  Administered 2014-03-16: 40 mg via INTRAVENOUS
  Filled 2014-03-16: qty 40

## 2014-03-16 MED ORDER — ACETAMINOPHEN 650 MG RE SUPP: 650.0000 mg | Freq: Four times a day (QID) | RECTAL | Status: DC | PRN

## 2014-03-16 MED ORDER — SODIUM CHLORIDE 0.9 % IV SOLN
INTRAVENOUS | Status: AC
  Administered 2014-03-16: 11:00:00 via INTRAVENOUS

## 2014-03-16 NOTE — Progress Notes (Signed)
The patient is a 53 year old woman with a history of chronic thrombocytopenia/ITP, mental retardation, seizure disorder, and GERD. She was admitted this morning by Dr. Maryland Pink for inadvertently taking too many medications and one day. She also complained of chest pain. She was briefly seen and examined. Her laboratory studies, vital signs, and chart were reviewed. She is currently without pain. Ultrasound of her abdomen revealed fatty infiltration of the liver-mild, possible small right pleural effusion, and mild left renal atrophy-similar to prior study. Her troponin I was negative x3. Her valproic acid level was not in the toxic range. We'll continue to monitor her. We'll advance her diet. If she remains stable overnight, will likely discharge to home tomorrow.

## 2014-03-16 NOTE — ED Notes (Signed)
Mom brought in pt's daily med container and pt's Wednesday PM and Thursday AM meds are missing; mom states if pt takes some of her extra meds she is prone to have a grand mal seizure.

## 2014-03-16 NOTE — ED Provider Notes (Signed)
CSN: [id number]     Arrival date & time 03/16/14  0109 History   First MD Initiated Contact with Patient 03/16/14 0145     Chief Complaint  Patient presents with  . Drug Overdose     (Consider location/radiation/quality/duration/timing/severity/associated sxs/prior Treatment) HPI Comments: Patient is a 53 year old female with history of mental retardation and seizure disorder. She is brought by her mother who is her caretaker for evaluation of inadvertent overdose of her home medications. She apparently became confused and took 2 extra doses of her home meds. She is somewhat difficult to get a history from and the majority of the information was obtained from the mother who is at bedside. The mother is quite certain that Erline Levine is going to have a seizure, as she tells me this has happened when she took extra medication in the past. Patient denies to me that she is suicidal and denies that this was an attempt to harm herself.  Patient is a 53 y.o. female presenting with Overdose. The history is provided by the patient.  Drug Overdose This is a new problem. The current episode started 1 to 2 hours ago. The problem occurs constantly. The problem has not changed since onset.Pertinent negatives include no chest pain, no abdominal pain, no headaches and no shortness of breath. Nothing aggravates the symptoms. Nothing relieves the symptoms. She has tried nothing for the symptoms. The treatment provided no relief.    Past Medical History  Diagnosis Date  . Seizures   . GERD (gastroesophageal reflux disease)   . Mental retardation     "Mild"   Past Surgical History  Procedure Laterality Date  . Total abdominal hysterectomy    . Tonsillectomy and adenoidectomy    . Skin graft to gum Right 08/2013  . Cataract extraction      both eyes, May of 2015  . Mouth surgery     Family History  Problem Relation Age of Onset  . High Cholesterol Mother   . High blood pressure Mother   . Diabetes Father     History  Substance Use Topics  . Smoking status: Former Smoker    Types: Cigarettes    Quit date: 05/22/2006  . Smokeless tobacco: Never Used  . Alcohol Use: No   OB History   Grav Para Term Preterm Abortions TAB SAB Ect Mult Living                 Review of Systems  Respiratory: Negative for shortness of breath.   Cardiovascular: Negative for chest pain.  Gastrointestinal: Negative for abdominal pain.  Neurological: Negative for headaches.  All other systems reviewed and are negative.     Allergies  Codeine and Lamictal  Home Medications   Prior to Admission medications   Medication Sig Start Date End Date Taking? Authorizing Provider  dexlansoprazole (DEXILANT) 60 MG capsule Take one capsule by mouth 30 minutes before breakfast daily 03/07/14  Yes Rogene Houston, MD  divalproex (DEPAKOTE ER) 500 MG 24 hr tablet Take 1 tablet (500 mg total) by mouth 2 (two) times daily. 04/08/13  Yes Dennie Bible, NP  lacosamide (VIMPAT) 200 MG TABS tablet Take 1 tablet (200 mg total) by mouth 2 (two) times daily. 08/24/13  Yes Kathrynn Ducking, MD  levocetirizine (XYZAL) 5 MG tablet Take 5 mg by mouth every evening.     Yes Historical Provider, MD  oxybutynin (DITROPAN-XL) 5 MG 24 hr tablet Take 5 mg by mouth daily.   Yes Historical  Provider, MD  Calcium Carb-Cholecalciferol (CALCIUM 500 +D) 500-400 MG-UNIT TABS Take 1 tablet by mouth 2 (two) times daily.    Historical Provider, MD  Calcium Carbonate Antacid (TUMS PO) Take 1 tablet by mouth as needed (heartburn).     Historical Provider, MD  estradiol (VIVELLE-DOT) 0.075 MG/24HR Place 1 patch onto the skin 2 (two) times a week. On Wednesday and Saturday    Historical Provider, MD  ibuprofen (ADVIL,MOTRIN) 200 MG tablet Take 800 mg by mouth every 6 (six) hours as needed for pain.     Historical Provider, MD  latanoprost (XALATAN) 0.005 % ophthalmic solution Place 1 drop into both eyes at bedtime.    Historical Provider, MD   Pediatric Multiple Vit-C-FA (PEDIATRIC MULTIVITAMIN) chewable tablet Chew 1 tablet by mouth daily.      Historical Provider, MD   BP 147/67  Pulse 68  Resp 18  Ht 5\' 6"  (1.676 m)  Wt 140 lb (63.504 kg)  BMI 22.61 kg/m2  SpO2 100% Physical Exam  Nursing note and vitals reviewed. Constitutional: She is oriented to person, place, and time. She appears well-developed and well-nourished. No distress.  Patient is resting comfortably. She is somewhat somnolent, however is easily arousable and appropriate.  HENT:  Head: Normocephalic and atraumatic.  Neck: Normal range of motion. Neck supple.  Cardiovascular: Normal rate and regular rhythm.  Exam reveals no gallop and no friction rub.   No murmur heard. Pulmonary/Chest: Effort normal and breath sounds normal. No respiratory distress. She has no wheezes.  Abdominal: Soft. Bowel sounds are normal. She exhibits no distension. There is no tenderness.  Musculoskeletal: Normal range of motion.  Neurological: She is alert and oriented to person, place, and time. No cranial nerve deficit. She exhibits normal muscle tone. Coordination normal.  Skin: Skin is warm and dry. She is not diaphoretic.    ED Course  Procedures (including critical care time) Labs Review Labs Reviewed  BASIC METABOLIC PANEL - Abnormal; Notable for the following:    Glucose, Bld 128 (*)    GFR calc non Af Amer 80 (*)    All other components within normal limits  CBC WITH DIFFERENTIAL  VALPROIC ACID LEVEL  ACETAMINOPHEN LEVEL  SALICYLATE LEVEL    Imaging Review No results found.   Date: 03/16/2014  Rate: 80  Rhythm: sinus  QRS Axis: nml  Intervals: widened qrs  ST/T Wave abnormalities: none  Conduction Disutrbances:rbbb  Narrative Interpretation:   Old EKG Reviewed: rbbb new from 2012    MDM   Final diagnoses:  None    Patient brought by EMS for evaluation of inadvertently taking two extra doses of her medications, including seizure medications and  oxybutynin.  She has a history of mental retardation and adds little useful history.  Poison control was consulted and recommended observation for several hours.  The mother had multiple concerns about her daughter's blood pressure, her dizziness, the possibility of her having a seizure, her complaining of chest and abdominal pain (which he patient herself never reported to me).  I attempted to reassure the mother that all was well, however she was uncomfortable with her being discharged.  For this reason, I have consulted Dr. Maryland Pink from the hospitalist service who will admit the patient for observation.    Veryl Speak, MD 03/16/14 [phone]

## 2014-03-16 NOTE — Care Management Note (Addendum)
    Page 1 of 1   03/17/2014     10:59:41 AM CARE MANAGEMENT NOTE 03/17/2014  Patient:  Maria [name], Maria [name]   Account Number:  [id number]  Date Initiated:  03/16/2014  Documentation initiated by:  Theophilus Kinds  Subjective/Objective Assessment:   Pt admitted from home with CP and possible drug overdose. Pt lives with her mother and will return home at discharge. Pt is independent with ADL's.     Action/Plan:   No CM needs noted. Pt and pts mother denies any home health needs.   Anticipated DC Date:  03/17/2014   Anticipated DC Plan:  [place]  CM consult      Choice offered to / List presented to:             Status of service:  Completed, signed off Medicare Important Message given?   (If response is "NO", the following Medicare IM given date fields will be blank) Date Medicare IM given:   Medicare IM given by:   Date Additional Medicare IM given:   Additional Medicare IM given by:    Discharge Disposition:  HOME/SELF CARE  Per UR Regulation:    If discussed at Long Length of Stay Meetings, dates discussed:    Comments:  03/17/14 [place], RN BSN CM Pt discharged home today. No CM needs noted.  03/16/14 [place], RN BSN CM

## 2014-03-16 NOTE — Progress Notes (Signed)
UR completed 

## 2014-03-16 NOTE — Progress Notes (Signed)
*  PRELIMINARY RESULTS* Echocardiogram 2D Echocardiogram has been performed.  Maria [name] 03/16/2014, 3:18 PM

## 2014-03-16 NOTE — H&P (Addendum)
[facility] History and Physical  Maria [name] BTD:[id number] DOB: 1961-03-03 DOA: 03/16/2014   PCP: Rocky Morel, MD  Specialists: She is followed by Dr. Floyde Parkins with neurology. Followed by Dr. Laural Golden with Gastroenterology. Followed by the [facility] for history of thrombocytopenia/ITP.  Chief Complaint: Took extra medications  HPI: Maria [name] is a 53 y.o. female with a past medical history of seizure disorder, mental retardation, GERD, chronic thrombocytopenia, who stays with her mother. She was brought in by her mother because patient took medications that she was supposed to take on Wednesday and Thursday, all at once on Tuesday. And her mother states that whenever the patient has done similarly in the past she always ends up having a seizure. Mother was also concerned because earlier on Tuesday patient was complaining of dizziness, abdominal pain, and chest pain. She was nauseous. No vomiting. No syncopal episode. Due to patient's mental retardation it is very difficult to attain any history from her. Whenever I would ask her about her pain she would frown and state that she has abdominal pain and lower chest pain. She does have a history of acid reflux and this does bother her once in a while. She states that her abdominal pain is mostly to the upper right side. And this, according to the mother, has happened before. So history is very limited.  Home Medications: Prior to Admission medications   Medication Sig Start Date End Date Taking? Authorizing Provider  dexlansoprazole (DEXILANT) 60 MG capsule Take one capsule by mouth 30 minutes before breakfast daily 03/07/14  Yes Rogene Houston, MD  divalproex (DEPAKOTE ER) 500 MG 24 hr tablet Take 1 tablet (500 mg total) by mouth 2 (two) times daily. 04/08/13  Yes Dennie Bible, NP  lacosamide (VIMPAT) 200 MG TABS tablet Take 1 tablet (200 mg total) by mouth 2 (two) times daily. 08/24/13  Yes Kathrynn Ducking, MD  levocetirizine (XYZAL) 5 MG tablet Take 5 mg by mouth every evening.     Yes Historical Provider, MD  oxybutynin (DITROPAN-XL) 5 MG 24 hr tablet Take 5 mg by mouth daily.   Yes Historical Provider, MD  Calcium Carb-Cholecalciferol (CALCIUM 500 +D) 500-400 MG-UNIT TABS Take 1 tablet by mouth 2 (two) times daily.    Historical Provider, MD  Calcium Carbonate Antacid (TUMS PO) Take 1 tablet by mouth as needed (heartburn).     Historical Provider, MD  estradiol (VIVELLE-DOT) 0.075 MG/24HR Place 1 patch onto the skin 2 (two) times a week. On Wednesday and Saturday    Historical Provider, MD  ibuprofen (ADVIL,MOTRIN) 200 MG tablet Take 800 mg by mouth every 6 (six) hours as needed for pain.     Historical Provider, MD  latanoprost (XALATAN) 0.005 % ophthalmic solution Place 1 drop into both eyes at bedtime.    Historical Provider, MD  Pediatric Multiple Vit-C-FA (PEDIATRIC MULTIVITAMIN) chewable tablet Chew 1 tablet by mouth daily.      Historical Provider, MD    Allergies:  Allergies  Allergen Reactions  . Codeine     REACTION: nausea, vomiting  . Lamictal [Lamotrigine]     Skin rash    Past Medical History: Past Medical History  Diagnosis Date  . Seizures   . GERD (gastroesophageal reflux disease)   . Mental retardation     "Mild"    Past Surgical History  Procedure Laterality Date  . Total abdominal hysterectomy    . Tonsillectomy and adenoidectomy    .  Skin graft to gum Right 08/2013  . Cataract extraction      both eyes, May of 2015  . Mouth surgery      Social History: She lives with her mother. Quit smoking few years ago. No alcohol use. Usually independent with daily activities, but requires constant supervision.  Family History:  Family History  Problem Relation Age of Onset  . High Cholesterol Mother   . High blood pressure Mother   . Diabetes Father      Review of Systems - Unable to obtain due to her mental status.  Physical Examination  Filed  Vitals:   03/16/14 0126 03/16/14 0400 03/16/14 0405  BP: 147/67 148/77   Pulse: 68 65   Temp:   96.4 F (35.8 C)  TempSrc: Oral  Rectal  Resp: 18 13   Height: _0  (1.676 m)    Weight: 63.504 kg (140 lb)    SpO2: 100% 96%     BP 148/77  Pulse 65  Temp(Src) 96.4 F (35.8 C) (Rectal)  Resp 13  Ht _1  (1.676 m)  Wt 63.504 kg (140 lb)  BMI 22.61 kg/m2  SpO2 96%  General appearance: alert, appears older than stated age, distracted, no distress and slowed mentation Head: Normocephalic, without obvious abnormality, atraumatic Eyes: conjunctivae/corneas clear. PERRL, EOM's intact, no nystagmus Throat: lips, mucosa, and tongue normal; teeth and gums normal Neck: no adenopathy, no carotid bruit, no JVD, supple, symmetrical, trachea midline and thyroid not enlarged, symmetric, no tenderness/mass/nodules Resp: clear to auscultation bilaterally Cardio: regular rate and rhythm, S1, S2 normal, no murmur, click, rub or gallop GI: Abdomen was soft. Vague tenderness in the epigastric area and in the right upper quadrant. No rebound, rigidity, or guarding. No masses, or organomegaly. Bowel sounds are present. Extremities: extremities normal, atraumatic, no cyanosis or edema Pulses: 2+ and symmetric Skin: Skin color, texture, turgor normal. No rashes or lesions Lymph nodes: Cervical, supraclavicular, and axillary nodes normal. Neurologic: alert, not fully cooperative. No facial asymmetry. Moving all extremities.  Laboratory Data: Results for orders placed during the hospital encounter of 03/16/14 (from the past 48 hour(s))  CBC WITH DIFFERENTIAL     Status: None   Collection Time    03/16/14  1:31 AM      Result Value Ref Range   WBC 10.0  4.0 - 10.5 K/uL   RBC 3.96  3.87 - 5.11 MIL/uL   Hemoglobin 12.7  12.0 - 15.0 g/dL   HCT 37.3  36.0 - 46.0 %   MCV 94.2  78.0 - 100.0 fL   MCH 32.1  26.0 - 34.0 pg   MCHC 34.0  30.0 - 36.0 g/dL   RDW 13.3  11.5 - 15.5 %   Platelets    150 - 400  K/uL   Value: PLATELET CLUMPS NOTED ON SMEAR, COUNT APPEARS ADEQUATE   Comment: SPECIMEN CHECKED FOR CLOTS   Neutrophils Relative % 69  43 - 77 %   Lymphocytes Relative 21  12 - 46 %   Monocytes Relative 10  3 - 12 %   Eosinophils Relative 0  0 - 5 %   Basophils Relative 0  0 - 1 %   Neutro Abs 6.9  1.7 - 7.7 K/uL   Lymphs Abs 2.1  0.7 - 4.0 K/uL   Monocytes Absolute 1.0  0.1 - 1.0 K/uL   Eosinophils Absolute 0.0  0.0 - 0.7 K/uL   Basophils Absolute 0.0  0.0 - 0.1 K/uL   Smear  Review       Value: PLATELET CLUMPS NOTED ON SMEAR, COUNT APPEARS ADEQUATE  BASIC METABOLIC PANEL     Status: Abnormal   Collection Time    03/16/14  1:31 AM      Result Value Ref Range   Sodium 141  137 - 147 mEq/L   Potassium 4.2  3.7 - 5.3 mEq/L   Chloride 103  96 - 112 mEq/L   CO2 27  19 - 32 mEq/L   Glucose, Bld 128 (*) 70 - 99 mg/dL   BUN 21  6 - 23 mg/dL   Creatinine, Ser 0.83  0.50 - 1.10 mg/dL   Calcium 9.2  8.4 - 10.5 mg/dL   GFR calc non Af Amer 80 (*) >90 mL/min   GFR calc Af Amer >90  >90 mL/min   Comment: (NOTE)     The eGFR has been calculated using the CKD EPI equation.     This calculation has not been validated in all clinical situations.     eGFR's persistently <90 mL/min signify possible Chronic Kidney     Disease.   Anion gap 11  5 - 15  VALPROIC ACID LEVEL     Status: None   Collection Time    03/16/14  1:31 AM      Result Value Ref Range   Valproic Acid Lvl 81.9  50.0 - 100.0 ug/mL  ACETAMINOPHEN LEVEL     Status: None   Collection Time    03/16/14  2:03 AM      Result Value Ref Range   Acetaminophen (Tylenol), Serum <15.0  10 - 30 ug/mL   Comment:            THERAPEUTIC CONCENTRATIONS VARY     SIGNIFICANTLY. A RANGE OF 10-30     ug/mL MAY BE AN EFFECTIVE     CONCENTRATION FOR MANY PATIENTS.     HOWEVER, SOME ARE BEST TREATED     AT CONCENTRATIONS OUTSIDE THIS     RANGE.     ACETAMINOPHEN CONCENTRATIONS     >150 ug/mL AT 4 HOURS AFTER     INGESTION AND >50 ug/mL AT  12     HOURS AFTER INGESTION ARE     OFTEN ASSOCIATED WITH TOXIC     REACTIONS.  SALICYLATE LEVEL     Status: Abnormal   Collection Time    03/16/14  2:03 AM      Result Value Ref Range   Salicylate Lvl <3.4 (*) 2.8 - 20.0 mg/dL    Radiology Reports: No results found.  Electrocardiogram: Sinus rhythm and 79 beats per minute. Normal axis. Right bundle branch block is present. No Q waves. Slightly prolonged QT interval is noted. No concerning ST changes.  Problem List  Principal Problem:   Chest pain Active Problems:   GERD (gastroesophageal reflux disease)   Mental retardation   Upper abdominal pain   Seizure disorder   Assessment: This is a 53 year old, Caucasian female who was brought in for multiple reasons including dizziness, chest pain, abdominal pain, and the fact, that she took excessive medications, which was unintentional.  Plan: #1 chest pain, and abdominal pain: EKG did not show any ischemic changes. I believe that her symptoms could be from acid reflux and gastritis. We will check LFTs and lipase. We will put her on PPI. We will get an ultrasound of her abdomen. Troponin will be checked. We'll also get an echocardiogram.  #2 unintentional medication ingestion: The situation was discussed with  poison control by ED staff. The amount of medications, that she took should not cause significant toxicity. I have reassured the patient's mother regarding this. Her valproic acid level was therapeutic.  #3 history of seizure disorder: Continue with her home medications.  #4 history of GERD: Continue with PPI.  #5 history of mental retardation: This makes it very challenging to obtain clinical information from the patient. But appears to be at baseline.  #6 dizziness: Etiology is unclear. She has a lesion in her calvarium which is followed by Neurologist. This has been stable for the past one to 2 years. We will get a CT head without contrast. We will have physical therapy and  occupational therapy evaluate and ambulate.  #7 history of thrombocytopenia and low-grade idiopathic thrombocytopenic purpura: Platelet counts have been reported as being normal. She is followed by the cancer clinic. She does have purpuric spots. This is currently not in acute issue.  DVT Prophylaxis: SCD's Code Status: Full code Family Communication: Discussed with the patient's mother  Disposition Plan: Observe to telemetry   Further management decisions will depend on results of further testing and patient's response to treatment.   [facility]  [facility] Pager [phone]  If 7PM-7AM, please contact night-coverage [email or web address] Password [email or web address]  03/16/2014, 5:30 AM

## 2014-03-16 NOTE — ED Notes (Signed)
Pt brought in by [facility] for c/o accidental overdose on seizure meds; pt is unable to give any history; pt is able to tell me her name and DOB

## 2014-03-16 NOTE — ED Notes (Signed)
Poison Control called and said nurse spoke with Maria [name] informed of pt's extra doses of medications;  Pt took Dexilant 30mg  x 1, Depakote 500mg  x2, Vimipat 200mg  x2; Oxybutin 5mg  x 1 and xyzal 5mg  x 1;  Maria [name] from [employer] said Depakote could cause CNS depression and lethargy, Vimpat could cause nausea the rest of the medications will do no harm;  Pt is alert and oriented, VSS.

## 2014-03-16 NOTE — ED Notes (Signed)
Dr. Maryland Pink in with pt at this time

## 2014-03-16 NOTE — Evaluation (Signed)
Physical Therapy Evaluation Patient Details Name: Maria [name] MRN: [id number] DOB: Jul 15, 1960 Today's Date: 03/16/2014   History of Present Illness  Maria [name] is a 53 y.o. female with a past medical history of seizure disorder, mental retardation, GERD, chronic thrombocytopenia, who stays with her mother. She was brought in by her mother because patient took medications that she was supposed to take on Wednesday and Thursday, all at once on Tuesday. And her mother states that whenever the patient has done similarly in the past she always ends up having a seizure. Mother was also concerned because earlier on Tuesday patient was complaining of dizziness, abdominal pain, and chest pain. She was nauseous. No vomiting. No syncopal episode. Due to patient's mental retardation it is very difficult to attain any history from her. Whenever I would ask her about her pain she would frown and state that she has abdominal pain and lower chest pain. She does have a history of acid reflux and this does bother her once in a while. She states that her abdominal pain is mostly to the upper right side. And this, according to the mother, has happened before. So history is very limited.    Clinical Impression  Pt is a 53 year old female who presents to PT with dx of altered mental status.  Pt has a hx of MR and seizures; after seizures mother reports pt has some falls initially with gait though no other falls reported.  During PT evaluation, pt was (I) with bed mobility skills, mod (I) with transfers without use of AD, and supervision/min guard for gait of 100 feet without use of AD.  No overt LOB, though pt does have a preference for amb near the wall and occasional touches on handrails in the hallways indicating pt may be a furniture walker at home, though this is noted reported by family/pt during questioning.  Pt to be d/c from acute PT services as pt is at baseline level of function.  Recommended to pt/family  that pt may benefit from use of std cane or shopping cart when gait at home/community at d/c as pt may have lower activity tolerance and requiring standing rest breaks as needed; family verbalizes understanding.  No recommendations for PT services or DME at this time.      Follow Up Recommendations No PT follow up    Equipment Recommendations  None recommended by PT       Precautions / Restrictions Precautions Precautions: Fall Restrictions Weight Bearing Restrictions: No      Mobility  Bed Mobility Overal bed mobility: Independent                Transfers Overall transfer level: Modified independent                  Ambulation/Gait Ambulation/Gait assistance: Supervision;Min guard Ambulation Distance (Feet): 100 Feet Assistive device: None Gait Pattern/deviations: Step-through pattern;Decreased dorsiflexion - right;Decreased dorsiflexion - left   Gait velocity interpretation: Below normal speed for age/gender General Gait Details: No use of AD with gait, or overt LOB.  Pt does prefer to be near the wall, and had occassional touches on the handrails in the hallways during gait     Balance Overall balance assessment: History of Falls;Needs assistance (Mother reports hx of falls after having a seizure, no other falls reported) Sitting-balance support: Single extremity supported;Feet supported Sitting balance-Leahy Scale: Good     Standing balance support: No upper extremity supported;During functional activity Standing balance-Leahy Scale: Fair  Pertinent Vitals/Pain Pain Assessment: Faces Faces Pain Scale: Hurts a little bit Pain Location: Neck ("i slept funny") Pain Intervention(s): Repositioned;Limited activity within patient's tolerance    Home Living Family/patient expects to be discharged to:: Private residence Living Arrangements: Parent Available Help at Discharge: Family;Available 24 hours/day Type of  Home: House Home Access: Stairs to enter   [employer] of Steps: 1 small (2 inch) step to enter front door Home Layout: Multi-level;Able to live on main level with bedroom/bathroom Home Equipment: Shower seat;Grab bars - tub/shower;Walker - 2 wheels;Cane - single point Additional Comments: Tub shower    Prior Function Level of Independence: Independent                  Extremity/Trunk Assessment               Lower Extremity Assessment: Overall WFL for tasks assessed         Communication   Communication: No difficulties  Cognition Arousal/Alertness: Awake/alert Behavior During Therapy: WFL for tasks assessed/performed Overall Cognitive Status: History of cognitive impairments - at baseline                       Assessment/Plan    PT Assessment Patent does not need any further PT services  PT Diagnosis     PT Problem List    PT Treatment Interventions     PT Goals (Current goals can be found in the Care Plan section) Acute Rehab PT Goals PT Goal Formulation: No goals set, d/c therapy     End of Session Equipment Utilized During Treatment: Gait belt Activity Tolerance: Patient limited by fatigue Patient left: in bed;with call bell/phone within reach;with bed alarm set;with family/visitor present      Functional Assessment Tool Used: Clinical Judgement Functional Limitation: Mobility: Walking and moving around Mobility: Walking and Moving Around Current Status (O6712): At least 1 percent but less than 20 percent impaired, limited or restricted Mobility: Walking and Moving Around Goal Status [id number]): At least 1 percent but less than 20 percent impaired, limited or restricted Mobility: Walking and Moving Around Discharge Status [phone]): At least 1 percent but less than 20 percent impaired, limited or restricted    Time: 0915-0939 PT Time Calculation (min): 24 min   Charges:   PT Evaluation $Initial PT Evaluation Tier I: 1  Procedure PT Treatments $Self Care/Home Management: 8-22 (Educated on PT findings, recommendation for use of RW/cane as needed, continue with HEP (hand weights, bike))   PT G Codes:   Functional Assessment Tool Used: Clinical Judgement Functional Limitation: Mobility: Walking and moving around    [facility] 03/16/2014, 9:49 AM

## 2014-03-16 NOTE — Plan of Care (Signed)
Problem: Phase I Progression Outcomes Goal: Hemodynamically stable Outcome: Completed/Met Date Met:  03/16/14 Troponin levels have been negative. Goal: Anginal pain relieved Outcome: Completed/Met Date Met:  03/16/14 Pt denies anginal pain.

## 2014-03-16 NOTE — ED Notes (Signed)
Dr. Stark Jock informed of pt's family members concerns

## 2014-03-16 NOTE — Evaluation (Signed)
Occupational Therapy Evaluation Patient Details Name: Maria [name] MRN: [id number] DOB: [birth year] Today's Date: 03/16/2014    History of Present Illness Maria [name] is a 53 y.o. female with a past medical history of seizure disorder, mental retardation, GERD, chronic thrombocytopenia, who stays with her mother. She was brought in by her mother because patient took medications that she was supposed to take on Wednesday and Thursday, all at once on Tuesday. And her mother states that whenever the patient has done similarly in the past she always ends up having a seizure. Mother was also concerned because earlier on Tuesday patient was complaining of dizziness, abdominal pain, and chest pain. She was nauseous. No vomiting. No syncopal episode. Due to patient's mental retardation it is very difficult to attain any history from her. Whenever I would ask her about her pain she would frown and state that she has abdominal pain and lower chest pain. She does have a history of acid reflux and this does bother her once in a while. She states that her abdominal pain is mostly to the upper right side. And this, according to the mother, has happened before. So history is very limited.     Clinical Impression   Pt is presenting to acute OT with above situation.  She has WFL strength, ROM, and sensation in BUE.  Pt and mother describe pt as being near baseline in ADL functioning.  They don't have any further concerns about ADL needs at this time. Pt needs no further OT services at this time.  Acute OT to sign off.    Follow Up Recommendations  No OT follow up    Equipment Recommendations  None recommended by OT    Recommendations for Other Services       Precautions / Restrictions Precautions Precautions: Fall Restrictions Weight Bearing Restrictions: No      Mobility Bed Mobility Overal bed mobility: Independent                Transfers Overall transfer level: Modified  independent                    Balance Overall balance assessment: History of Falls Sitting-balance support: Single extremity supported;Feet supported Sitting balance-Leahy Scale: Good     Standing balance support: No upper extremity supported;During functional activity Standing balance-Leahy Scale: Fair                              ADL Overall ADL's : At baseline                                             Vision                     Perception     Praxis      Pertinent Vitals/Pain Pain Assessment: Faces (a little bit, neck pain) Faces Pain Scale: Hurts a little bit Pain Location: Neck ("i slept funny") Pain Intervention(s): Repositioned;Limited activity within patient's tolerance     Hand Dominance Right   Extremity/Trunk Assessment Upper Extremity Assessment Upper Extremity Assessment: Overall WFL for tasks assessed   Lower Extremity Assessment Lower Extremity Assessment: Overall WFL for tasks assessed       Communication Communication Communication: No difficulties   Cognition Arousal/Alertness: Awake/alert Behavior During Therapy: WFL for  tasks assessed/performed Overall Cognitive Status: History of cognitive impairments - at baseline                     General Comments       Exercises       Shoulder Instructions      Home Living Family/patient expects to be discharged to:: Private residence Living Arrangements: Parent Available Help at Discharge: Family;Available 24 hours/day Type of Home: House Home Access: Stairs to enter [employer] of Steps: 1 small (2 inch) step to enter front door   Home Layout: Multi-level;Able to live on main level with bedroom/bathroom Alternate Level Stairs-Number of Steps: Basement with flight of stairs Alternate Level Stairs-Rails: Right (Rt handrail going up, wall on lt) Bathroom Shower/Tub: Tub/shower unit   Bathroom Toilet: Standard     Home  Equipment: Shower seat;Grab bars - tub/shower;Walker - 2 wheels;Cane - single point   Additional Comments: Tub shower      Prior Functioning/Environment Level of Independence: Independent    ADL's / Homemaking Assistance Needed: Mother provides assist with IADL tasks - laundry, house keeping, grocery shopping, some meal prep.  pt is independent with ADL needs and simple meal prep.        OT Diagnosis:     OT Problem List:     OT Treatment/Interventions:      OT Goals(Current goals can be found in the care plan section) Acute Rehab OT Goals Patient Stated Goal: no OT goals needed OT Goal Formulation: With patient  OT Frequency:     Barriers to D/C:            Co-evaluation PT/OT/SLP Co-Evaluation/Treatment: Yes Reason for Co-Treatment: Complexity of the patient's impairments (multi-system involvement);Necessary to address cognition/behavior during functional activity;For patient/therapist safety   OT goals addressed during session: ADL's and self-care;Strengthening/ROM      End of Session Equipment Utilized During Treatment: Gait belt  Activity Tolerance: Patient tolerated treatment well Patient left: in bed;with family/visitor present;with call bell/phone within reach;with bed alarm set   Time: 3419-3790 OT Time Calculation (min): 24 min Charges:  OT General Charges $OT Visit: 1 Procedure OT Evaluation $Initial OT Evaluation Tier I: 1 Procedure G-Codes: OT G-codes **NOT FOR INPATIENT CLASS** Functional Assessment Tool Used: Clinical Judgement Functional Limitation: Self care Self Care Current Status (W4097): At least 1 percent but less than 20 percent impaired, limited or restricted Self Care Goal Status (D5329): At least 1 percent but less than 20 percent impaired, limited or restricted Self Care Discharge Status [phone]): At least 1 percent but less than 20 percent impaired, limited or restricted   [name], MS, OTR/L [place] 03/16/2014, 9:56 AM

## 2014-03-16 NOTE — ED Notes (Signed)
Pt gone over to CT 

## 2014-03-16 NOTE — ED Notes (Signed)
Report given to Tamela Oddi, RN on 300

## 2014-03-17 DIAGNOSIS — D696 Thrombocytopenia, unspecified: Secondary | ICD-10-CM | POA: Diagnosis not present

## 2014-03-17 DIAGNOSIS — G40909 Epilepsy, unspecified, not intractable, without status epilepticus: Secondary | ICD-10-CM | POA: Diagnosis not present

## 2014-03-17 DIAGNOSIS — R079 Chest pain, unspecified: Secondary | ICD-10-CM | POA: Diagnosis not present

## 2014-03-17 DIAGNOSIS — K219 Gastro-esophageal reflux disease without esophagitis: Secondary | ICD-10-CM

## 2014-03-17 LAB — CBC
HEMATOCRIT: 36.5 % (ref 36.0–46.0)
Hemoglobin: 12.1 g/dL (ref 12.0–15.0)
MCH: 31.7 pg (ref 26.0–34.0)
MCHC: 33.2 g/dL (ref 30.0–36.0)
MCV: 95.5 fL (ref 78.0–100.0)
Platelets: 124 10*3/uL — ABNORMAL LOW (ref 150–400)
RBC: 3.82 MIL/uL — ABNORMAL LOW (ref 3.87–5.11)
RDW: 13.5 % (ref 11.5–15.5)
WBC: 7.3 10*3/uL (ref 4.0–10.5)

## 2014-03-17 LAB — COMPREHENSIVE METABOLIC PANEL
ALK PHOS: 51 U/L (ref 39–117)
ALT: 16 U/L (ref 0–35)
AST: 15 U/L (ref 0–37)
Albumin: 3.3 g/dL — ABNORMAL LOW (ref 3.5–5.2)
Anion gap: 13 (ref 5–15)
BUN: 16 mg/dL (ref 6–23)
CO2: 27 mEq/L (ref 19–32)
Calcium: 9.8 mg/dL (ref 8.4–10.5)
Chloride: 103 mEq/L (ref 96–112)
Creatinine, Ser: 0.85 mg/dL (ref 0.50–1.10)
GFR calc Af Amer: 90 mL/min — ABNORMAL LOW (ref >90)
GFR calc non Af Amer: 77 mL/min — ABNORMAL LOW (ref >90)
GLUCOSE: 91 mg/dL (ref 70–99)
POTASSIUM: 4 meq/L (ref 3.7–5.3)
SODIUM: 143 meq/L (ref 137–147)
Total Bilirubin: 0.3 mg/dL (ref 0.3–1.2)
Total Protein: 6.4 g/dL (ref 6.0–8.3)

## 2014-03-17 NOTE — Progress Notes (Signed)
NURSING PROGRESS NOTE  NAVNEET SCHMUCK [id number] Discharge Data: 03/17/2014 2:01 PM Attending Provider: No att. providers found [id number], Kassie Mends, MD   [name] to be D/C'd Home per MD order.    All IV's discontinued and monitored for bleeding.  All belongings returned to patient for patient to take home.  AVS summary reviewed with patient and her mother.  Patient left floor via wheelchair, escorted by RN.  Last Documented Vital Signs:  Blood pressure 143/68, pulse 71, temperature 97.8 F (36.6 C), temperature source Oral, resp. rate 20, height 5\' 6"  (1.676 m), weight 69.7 kg (153 lb 10.6 oz), SpO2 99.00%.  Cecilie Kicks D

## 2014-03-17 NOTE — Discharge Summary (Signed)
The patient was seen and examined. Her chart was reviewed. She was discussed with nurse practitioner, Ms. Renard Hamper. Agree with her assessment and plan. The patient is clinically improved, hemodynamically stable and has no complaints. We'll discharge to home with recommended followup with her PCP in the next week or 2.

## 2014-03-17 NOTE — Discharge Instructions (Signed)
Follow up with PCP 1-2 weeks for evaluation of symptoms and review of medication management plan Keep scheduled appointments with current specialists.

## 2014-03-17 NOTE — Plan of Care (Signed)
Problem: Phase I Progression Outcomes Goal: Anginal pain relieved Outcome: Completed/Met Date Met:  03/17/14 Patient denies any pain in her chest at this time.  Problem: Discharge Progression Outcomes Goal: Tolerates diet Outcome: Completed/Met Date Met:  03/17/14 Patient tolerating diet without any complaints of nausea, vomiting, or pain.

## 2014-03-17 NOTE — Discharge Summary (Signed)
Physician Discharge Summary  Maria [name] UXN:[id number] DOB: 1961/04/13 DOA: 03/16/2014  PCP: Rocky Morel, MD  Admit date: 03/16/2014 Discharge date: 03/17/2014  Time spent: 40 minutes  Recommendations for Outpatient Follow-up:  1. Follow up with PCP Dr Ethlyn Gallery in 1-2 weeks for evaluation of symptoms. Recommend discussion regarding medication management strategies. Consider follow up with Dr. Laural Golden if no improvement 2. Follow up with neurology, hematology as scheduled  Discharge Diagnoses:  Principal Problem:   Chest pain Active Problems:   GERD (gastroesophageal reflux disease)   Thrombocytopenia   Mental retardation   Upper abdominal pain   Seizure disorder   Discharge Condition: stable  Diet recommendation: heart healthy  Filed Weights   03/16/14 0126 03/16/14 0659 03/17/14 0446  Weight: 63.504 kg (140 lb) 65.4 kg (144 lb 2.9 oz) 69.7 kg (153 lb 10.6 oz)    History of present illness:  Maria [name] is a 53 y.o. female with a past medical history of seizure disorder, mental retardation, GERD, chronic thrombocytopenia, who stays with her mother. She was brought in by her mother on 03/16/14 because patient took medications that she was supposed to take on Wednesday and Thursday, all at once on Tuesday. And her mother stated that whenever the patient had done this in the past she ended up having a seizure. Mother was also concerned because earlier on Tuesday patient was complaining of dizziness, abdominal pain, and chest pain. She was nauseous. No vomiting. No syncopal episode. Due to patient's mental retardation  very difficult to attain any history from her. Whenever asked about her pain she would frown and state that she had abdominal pain and lower chest pain. She does have a history of acid reflux and this does bother her once in a while. She stated that her abdominal pain was mostly to the upper right side. And this, according to the mother, has happened  before. So history very limited.  Hospital Course:  #1 chest pain, and abdominal pain: no events on telemetry. Troponin negative x3. Echo with EF of 60-65% and mild LVH. Serial  EKG's did not show any ischemic changes. Likely symptoms related to chronic acid reflux and gastritis. LFTs and lipase within the limits of normal. Ultrasound of her abdomen revealed fatty infiltration of liver and mild, possible small right pleural effusion with left mild left renal atrophy similar to previous study. She is followed by Dr Laural Golden. Recommend follow up if no improvement   #2 unintentional medication ingestion: The situation was discussed with poison control by ED staff. The amount of medications, that she took should not cause significant toxicity. Her valproic acid level was not toxic range. Discussed medication management with mother who verbalized new strategy to manage medications that will minimize risk of patient making this error again.   #3 history of seizure disorder: Valproic acid level not in toxic range. She is followed by Dr Jannifer Franklin. Last visit 10/2013. Due for 6 months check 11/15. No seizure activity this hospitalization. Continue with her home medications.   #4 history of GERD: recommend follow up with Dr Laural Golden if symptoms re-occur. Continue with PPI.   #5 history of mental retardation: Remains at baseline.  #6 dizziness: no further episodes this hospitalization.  Etiology is unclear. She has a lesion in her calvarium which is followed by Neurologist. This has been stable for the past one to 2 years.  CT of her head without contrast reveal no acute intracranial process. Premature cerebral atrophy or patient age, similar from  prior. Stable right frontal lobe encephalomalacia, which may related to  remote infarct or trauma.  Stable 1.2 cm lucent lesion within the right frontal calvarium. Evaluated and ambulated by physical therapy and occupational therapy who opine steady gait and no HH needs.  Followed by Dr Jannifer Franklin and next appointment due 04/2014.  #7 history of thrombocytopenia and low-grade idiopathic thrombocytopenic purpura: Platelet clump on admission with report indicating count appears adequate. Chart review indicates last seen by hematology 02/2014 and platelets noted to be stable for 2 years. No s/sx bleeding/bruising.   Procedures: Procedure narrative: Transthoracic echocardiography. Image quality was fair due to poor sound transmission. Suboptimal valvular and endocardial visualization. - Left ventricle: The cavity size was normal. Wall thickness was increased in a pattern of mild LVH. Systolic function was normal. The estimated ejection fraction was in the range of 60% to 65%. Wall motion was normal; there were no regional wall motion abnormalities. Diastolic dysfunction, grade indeterminate. Normal filling pressures.  Consultations:  none  Discharge Exam: Filed Vitals:   03/17/14 0446  BP: 143/68  Pulse: 71  Temp: 97.8 F (36.6 C)  Resp: 20    General: well nourished ambulates in room with steady gait Cardiovascular: RRR No MGR No LE edema Respiratory: normal effort BS clear bilaterally  Discharge Instructions You were cared for by a hospitalist during your hospital stay. If you have any questions about your discharge medications or the care you received while you were in the hospital after you are discharged, you can call the unit and asked to speak with the hospitalist on call if the hospitalist that took care of you is not available. Once you are discharged, your primary care physician will handle any further medical issues. Please note that NO REFILLS for any discharge medications will be authorized once you are discharged, as it is imperative that you return to your primary care physician (or establish a relationship with a primary care physician if you do not have one) for your aftercare needs so that they can reassess your need for medications and  monitor your lab values.   Current Discharge Medication List    CONTINUE these medications which have NOT CHANGED   Details  Calcium Carb-Cholecalciferol (CALCIUM 500 +D) 500-400 MG-UNIT TABS Take 1 tablet by mouth 2 (two) times daily.    Calcium Carbonate Antacid (TUMS PO) Take 1 tablet by mouth as needed (heartburn).     dexlansoprazole (DEXILANT) 60 MG capsule Take one capsule by mouth 30 minutes before breakfast daily Qty: 90 capsule, Refills: 3   Associated Diagnoses: Gastroesophageal reflux disease, esophagitis presence not specified    divalproex (DEPAKOTE ER) 500 MG 24 hr tablet Take 1 tablet (500 mg total) by mouth 2 (two) times daily. Qty: 180 tablet, Refills: 3    estradiol (VIVELLE-DOT) 0.075 MG/24HR Place 1 patch onto the skin 2 (two) times a week. On Wednesday and Saturday    ibuprofen (ADVIL,MOTRIN) 200 MG tablet Take 400-600 mg by mouth every 6 (six) hours as needed for moderate pain.     lacosamide (VIMPAT) 200 MG TABS tablet Take 1 tablet (200 mg total) by mouth 2 (two) times daily. Qty: 180 tablet, Refills: 1    latanoprost (XALATAN) 0.005 % ophthalmic solution Place 1 drop into both eyes at bedtime.    levocetirizine (XYZAL) 5 MG tablet Take 5 mg by mouth every morning.     oxybutynin (DITROPAN-XL) 5 MG 24 hr tablet Take 5 mg by mouth daily.   Associated Diagnoses: Thrombocytopenia  Pediatric Multiple Vit-C-FA (PEDIATRIC MULTIVITAMIN) chewable tablet Chew 1 tablet by mouth daily.     Associated Diagnoses: Thrombocytopenia       Allergies  Allergen Reactions  . Codeine     REACTION: nausea, vomiting  . Lamictal [Lamotrigine]     Skin rash   Follow-up Information   Follow up with Rocky Morel, MD. Schedule an appointment as soon as possible for a visit in 1 week. (make appointment for follow up in 1-2 weeks for evaluation of symptoms)    Specialty:  Family Medicine   Contact information:   [address] [place]  [address] [phone]        The results of significant diagnostics from this hospitalization (including imaging, microbiology, ancillary and laboratory) are listed below for reference.    Significant Diagnostic Studies: Dg Chest 2 View  03/16/2014   CLINICAL DATA:  Chest pain.  Abdominal pain.  EXAM: CHEST  2 VIEW  COMPARISON:  11/28/2010  FINDINGS: Normal heart size and mediastinal contours. Stable biapical pleural/ parenchymal scarring. There is mild atelectasis or scarring at the left base. There is no edema, consolidation, effusion, or pneumothorax.  IMPRESSION: No active cardiopulmonary disease.   Electronically Signed   By: Jorje Guild M.D.   On: 03/16/2014 06:42   Ct Head Wo Contrast  03/16/2014   CLINICAL DATA:  Additional evaluation for acute dizziness.  EXAM: CT HEAD WITHOUT CONTRAST  TECHNIQUE: Contiguous axial images were obtained from the base of the skull through the vertex without intravenous contrast.  COMPARISON:  Prior CT from 09/16/2013  FINDINGS: Premature atrophy for age again noted, stable from prior. Encephalomalacia within the anterior inferior right frontal lobe is also stable, which may related to remote infarct or possibly trauma.  No acute large vessel territory infarct. No No acute intracranial hemorrhage. No mass effect or midline shift. No hydrocephalus. There is no extra-axial fluid collection.  Calvarium is intact. 1.2 cm lucent lesion within the right frontal calvarium is unchanged. Scalp soft tissues within normal limits.  No acute abnormality seen about the orbits.  Paranasal sinuses and mastoid air cells are clear.  IMPRESSION: 1. No acute intracranial process. 2. Premature cerebral atrophy or patient age, similar from prior. 3. Stable right frontal lobe encephalomalacia, which may related to remote infarct or trauma. 4. Stable 1.2 cm lucent lesion within the right frontal calvarium.   Electronically Signed   By: Jeannine Boga M.D.   On: 03/16/2014 06:35    US Abdomen Complete  03/16/2014   CLINICAL DATA:  Initial evaluation of upper abdominal pain for 2 days, with nausea follow-up current history of chronic thrombocytopenia, hypertension, gastroesophageal reflux disease  EXAM: ULTRASOUND ABDOMEN COMPLETE  COMPARISON:  CT scan 12/15/2012  FINDINGS: Gallbladder: No gallstones or wall thickening visualized. No sonographic Murphy sign noted.  Common bile duct: Diameter: 3 mm  Liver: Span is 16 cm.  Mildly increased echogenicity diffusely.  IVC: No abnormality visualized.  Pancreas: Limited evaluation due to bowel gas, grossly negative  Spleen: Size and appearance within normal limits.  Right Kidney: Length: 10.4 cm. Echogenicity within normal limits. No mass or hydronephrosis visualized.  Left Kidney: Length: 8.4 cm. Normal echogenicity. Mild diffuse cortical atrophy.  Abdominal aorta: Limited evaluation due to bowel gas. No evidence of aneurysm. Distal aortic calcification appreciated.  Other findings: Possible small right pleural effusion.  IMPRESSION: 1. Fatty infiltration of the liver, mild 2. Possible small right pleural effusion 3. Mild left renal atrophy similar to prior study  Electronically Signed   By: Skipper Cliche M.D.   On: 03/16/2014 12:45    Microbiology: No results found for this or any previous visit (from the past 240 hour(s)).   Labs: Basic Metabolic Panel:  Recent Labs Lab 03/16/14 0131 03/17/14 0516  NA 141 143  K 4.2 4.0  CL 103 103  CO2 27 27  GLUCOSE 128* 91  BUN 21 16  CREATININE 0.83 0.85  CALCIUM 9.2 9.8   Liver Function Tests:  Recent Labs Lab 03/16/14 0553 03/17/14 0516  AST 17 15  ALT 18 16  ALKPHOS 57 51  BILITOT 0.3 0.3  PROT 6.7 6.4  ALBUMIN 3.6 3.3*    Recent Labs Lab 03/16/14 0553  LIPASE 23   No results found for this basename: AMMONIA,  in the last 168 hours CBC:  Recent Labs Lab 03/16/14 0131 03/17/14 0516  WBC 10.0 7.3  NEUTROABS 6.9  --   HGB 12.7 12.1  HCT 37.3 36.5  MCV  94.2 95.5  PLT PLATELET CLUMPS NOTED ON SMEAR, COUNT APPEARS ADEQUATE 124*   Cardiac Enzymes:  Recent Labs Lab 03/16/14 0553 03/16/14 1246 03/16/14 1700  TROPONINI <0.30 <0.30 <0.30   BNP: BNP (last 3 results) No results found for this basename: PROBNP,  in the last 8760 hours CBG: No results found for this basename: GLUCAP,  in the last 168 hours     Signed:  Radene Gunning  [facility] 03/17/2014, 10:28 AM

## 2014-03-23 DIAGNOSIS — Z6823 Body mass index (BMI) 23.0-23.9, adult: Secondary | ICD-10-CM | POA: Diagnosis not present

## 2014-03-23 DIAGNOSIS — F70 Mild intellectual disabilities: Secondary | ICD-10-CM | POA: Diagnosis not present

## 2014-03-24 DIAGNOSIS — Z6823 Body mass index (BMI) 23.0-23.9, adult: Secondary | ICD-10-CM | POA: Diagnosis not present

## 2014-03-24 DIAGNOSIS — F70 Mild intellectual disabilities: Secondary | ICD-10-CM | POA: Diagnosis not present

## 2014-03-28 ENCOUNTER — Telehealth: Payer: Self-pay | Admitting: Neurology

## 2014-03-28 NOTE — Telephone Encounter (Signed)
I called back.  Ms Smits said she has spoken with PCP.  It was regarding labs she had drawn at their office..  States nothing is needed from [place] at this time.

## 2014-03-28 NOTE — Telephone Encounter (Signed)
Patient's mother calling to ask a question about patient's Depakote, please return call and advise.

## 2014-03-29 ENCOUNTER — Other Ambulatory Visit: Payer: Self-pay

## 2014-03-29 MED ORDER — LACOSAMIDE 200 MG PO TABS: 200.0000 mg | ORAL_TABLET | Freq: Two times a day (BID) | ORAL | Status: DC

## 2014-03-30 NOTE — Telephone Encounter (Signed)
Rx signed and faxed.

## 2014-04-11 DIAGNOSIS — L209 Atopic dermatitis, unspecified: Secondary | ICD-10-CM | POA: Diagnosis not present

## 2014-04-11 DIAGNOSIS — Z6823 Body mass index (BMI) 23.0-23.9, adult: Secondary | ICD-10-CM | POA: Diagnosis not present

## 2014-04-14 ENCOUNTER — Ambulatory Visit: Payer: Medicare Other | Admitting: Nurse Practitioner

## 2014-04-15 ENCOUNTER — Emergency Department (HOSPITAL_COMMUNITY)
Admission: EM | Admit: 2014-04-15 | Discharge: 2014-04-15 | Disposition: A | Discharge status: Home / Self Care | Payer: Medicare Other | Attending: Emergency Medicine | Admitting: Emergency Medicine

## 2014-04-15 ENCOUNTER — Emergency Department (HOSPITAL_COMMUNITY): Payer: Medicare Other

## 2014-04-15 ENCOUNTER — Encounter (HOSPITAL_COMMUNITY): Payer: Self-pay | Admitting: Emergency Medicine

## 2014-04-15 DIAGNOSIS — Y929 Unspecified place or not applicable: Secondary | ICD-10-CM | POA: Diagnosis not present

## 2014-04-15 DIAGNOSIS — S3992XA Unspecified injury of lower back, initial encounter: Secondary | ICD-10-CM | POA: Insufficient documentation

## 2014-04-15 DIAGNOSIS — N390 Urinary tract infection, site not specified: Secondary | ICD-10-CM | POA: Diagnosis not present

## 2014-04-15 DIAGNOSIS — G40909 Epilepsy, unspecified, not intractable, without status epilepticus: Secondary | ICD-10-CM | POA: Diagnosis not present

## 2014-04-15 DIAGNOSIS — S79911A Unspecified injury of right hip, initial encounter: Secondary | ICD-10-CM | POA: Insufficient documentation

## 2014-04-15 DIAGNOSIS — F79 Unspecified intellectual disabilities: Secondary | ICD-10-CM | POA: Insufficient documentation

## 2014-04-15 DIAGNOSIS — M25551 Pain in right hip: Secondary | ICD-10-CM

## 2014-04-15 DIAGNOSIS — S199XXA Unspecified injury of neck, initial encounter: Secondary | ICD-10-CM | POA: Diagnosis not present

## 2014-04-15 DIAGNOSIS — S299XXA Unspecified injury of thorax, initial encounter: Secondary | ICD-10-CM | POA: Diagnosis not present

## 2014-04-15 DIAGNOSIS — R569 Unspecified convulsions: Secondary | ICD-10-CM | POA: Diagnosis not present

## 2014-04-15 DIAGNOSIS — W06XXXA Fall from bed, initial encounter: Secondary | ICD-10-CM | POA: Insufficient documentation

## 2014-04-15 DIAGNOSIS — S0990XA Unspecified injury of head, initial encounter: Secondary | ICD-10-CM | POA: Diagnosis not present

## 2014-04-15 DIAGNOSIS — M549 Dorsalgia, unspecified: Secondary | ICD-10-CM | POA: Diagnosis not present

## 2014-04-15 DIAGNOSIS — Z79899 Other long term (current) drug therapy: Secondary | ICD-10-CM | POA: Insufficient documentation

## 2014-04-15 DIAGNOSIS — K219 Gastro-esophageal reflux disease without esophagitis: Secondary | ICD-10-CM | POA: Insufficient documentation

## 2014-04-15 DIAGNOSIS — M5489 Other dorsalgia: Secondary | ICD-10-CM

## 2014-04-15 DIAGNOSIS — Y998 Other external cause status: Secondary | ICD-10-CM | POA: Insufficient documentation

## 2014-04-15 DIAGNOSIS — Y9389 Activity, other specified: Secondary | ICD-10-CM | POA: Diagnosis not present

## 2014-04-15 DIAGNOSIS — Z87891 Personal history of nicotine dependence: Secondary | ICD-10-CM | POA: Insufficient documentation

## 2014-04-15 DIAGNOSIS — M545 Low back pain: Secondary | ICD-10-CM | POA: Diagnosis not present

## 2014-04-15 DIAGNOSIS — R319 Hematuria, unspecified: Secondary | ICD-10-CM

## 2014-04-15 LAB — URINALYSIS, ROUTINE W REFLEX MICROSCOPIC
BILIRUBIN URINE: NEGATIVE
Glucose, UA: NEGATIVE mg/dL
KETONES UR: NEGATIVE mg/dL
Leukocytes, UA: NEGATIVE
Nitrite: NEGATIVE
PH: 5.5 (ref 5.0–8.0)
Protein, ur: NEGATIVE mg/dL
Specific Gravity, Urine: 1.025 (ref 1.005–1.030)
UROBILINOGEN UA: 0.2 mg/dL (ref 0.0–1.0)

## 2014-04-15 LAB — COMPREHENSIVE METABOLIC PANEL
ALK PHOS: 55 U/L (ref 39–117)
ALT: 19 U/L (ref 0–35)
ANION GAP: 13 (ref 5–15)
AST: 19 U/L (ref 0–37)
Albumin: 3.8 g/dL (ref 3.5–5.2)
BILIRUBIN TOTAL: 0.3 mg/dL (ref 0.3–1.2)
BUN: 24 mg/dL — ABNORMAL HIGH (ref 6–23)
CHLORIDE: 101 meq/L (ref 96–112)
CO2: 26 meq/L (ref 19–32)
Calcium: 9.9 mg/dL (ref 8.4–10.5)
Creatinine, Ser: 0.85 mg/dL (ref 0.50–1.10)
GFR calc non Af Amer: 77 mL/min — ABNORMAL LOW (ref >90)
GFR, EST AFRICAN AMERICAN: 90 mL/min — AB (ref >90)
GLUCOSE: 98 mg/dL (ref 70–99)
Potassium: 4.5 mEq/L (ref 3.7–5.3)
Sodium: 140 mEq/L (ref 137–147)
Total Protein: 6.9 g/dL (ref 6.0–8.3)

## 2014-04-15 LAB — CBC WITH DIFFERENTIAL/PLATELET
Basophils Absolute: 0 10*3/uL (ref 0.0–0.1)
Basophils Relative: 0 % (ref 0–1)
Eosinophils Absolute: 0.1 10*3/uL (ref 0.0–0.7)
Eosinophils Relative: 2 % (ref 0–5)
HCT: 38.2 % (ref 36.0–46.0)
HEMOGLOBIN: 13.1 g/dL (ref 12.0–15.0)
LYMPHS ABS: 2.2 10*3/uL (ref 0.7–4.0)
Lymphocytes Relative: 31 % (ref 12–46)
MCH: 32 pg (ref 26.0–34.0)
MCHC: 34.3 g/dL (ref 30.0–36.0)
MCV: 93.2 fL (ref 78.0–100.0)
MONOS PCT: 9 % (ref 3–12)
Monocytes Absolute: 0.7 10*3/uL (ref 0.1–1.0)
NEUTROS PCT: 59 % (ref 43–77)
Neutro Abs: 4.3 10*3/uL (ref 1.7–7.7)
Platelets: 113 10*3/uL — ABNORMAL LOW (ref 150–400)
RBC: 4.1 MIL/uL (ref 3.87–5.11)
RDW: 13.4 % (ref 11.5–15.5)
WBC: 7.3 10*3/uL (ref 4.0–10.5)

## 2014-04-15 LAB — URINE MICROSCOPIC-ADD ON

## 2014-04-15 LAB — VALPROIC ACID LEVEL: Valproic Acid Lvl: 122.6 ug/mL — ABNORMAL HIGH (ref 50.0–100.0)

## 2014-04-15 MED ORDER — HYDROCODONE-ACETAMINOPHEN 5-325 MG PO TABS
1.0000 | ORAL_TABLET | Freq: Once | ORAL | Status: AC
  Administered 2014-04-15: 1 via ORAL
  Filled 2014-04-15: qty 1

## 2014-04-15 MED ORDER — CEPHALEXIN 500 MG PO CAPS
500.0000 mg | ORAL_CAPSULE | Freq: Once | ORAL | Status: AC
  Administered 2014-04-15: 500 mg via ORAL
  Filled 2014-04-15: qty 1

## 2014-04-15 MED ORDER — CEPHALEXIN 500 MG PO CAPS: 500.0000 mg | ORAL_CAPSULE | Freq: Two times a day (BID) | ORAL | Status: DC

## 2014-04-15 NOTE — ED Notes (Signed)
EMS reports no seizure activity witnessed on arrival or during transport. Pt alert and talking. No rotation or shortening of R leg. Bruising to R upper thigh. Pt states she was in the bathroom and fell when she had the seizure.

## 2014-04-15 NOTE — ED Notes (Signed)
Patient ambulated to bathroom well balanced with a steady gait.

## 2014-04-15 NOTE — ED Notes (Signed)
EMS reports seizure with fall from bed approx 2 hours PTA. No shortening of legs. Pt c/o R hip pain. Bruising noted to R hip. Pt ambulated to bathroom prior to Triage. Pt has hx of MR. Pt alert and oriented x 3.

## 2014-04-15 NOTE — ED Notes (Signed)
Pt reports experiencing seizure at 0700 today. Pt reports falling on back, right hip, and back of head. Pt reports 8/10 pain in back, head, and right hip. Pt denies incontinces. Pt reports h/a. Pt reports biting bottom lip.

## 2014-04-15 NOTE — Discharge Instructions (Signed)
Your Depakote level was elevated at 122 today. I recommend that you do not take your dose of Depakote tonight or either doses tomorrow (Saturday) or Sunday morning dose. You may start this medication again Sunday evening. He will need to see her primary care physician to have this rechecked early next week.   Epilepsy Epilepsy is a disorder in which a person has repeated seizures over time. A seizure is a release of abnormal electrical activity in the brain. Seizures can cause a change in attention, behavior, or the ability to remain awake and alert (altered mental status). Seizures often involve uncontrollable shaking (convulsions).  Most people with epilepsy lead normal lives. However, people with epilepsy are at an increased risk of falls, accidents, and injuries. Therefore, it is important to begin treatment right away. CAUSES  Epilepsy has many possible causes. Anything that disturbs the normal pattern of brain cell activity can lead to seizures. This may include:   Head injury.  Birth trauma.  High fever as a child.  Stroke.  Bleeding into or around the brain.  Certain drugs.  Prolonged low oxygen, such as what occurs after CPR efforts.  Abnormal brain development.  Certain illnesses, such as meningitis, encephalitis (brain infection), malaria, and other infections.  An imbalance of nerve signaling chemicals (neurotransmitters).  SIGNS AND SYMPTOMS  The symptoms of a seizure can vary greatly from one person to another. Right before a seizure, you may have a warning (aura) that a seizure is about to occur. An aura may include the following symptoms:  Fear or anxiety.  Nausea.  Feeling like the room is spinning (vertigo).  Vision changes, such as seeing flashing lights or spots. Common symptoms during a seizure include:  Abnormal sensations, such as an abnormal smell or a bitter taste in the mouth.   Sudden, general body stiffness.   Convulsions that involve  rhythmic jerking of the face, arm, or leg on one or both sides.   Sudden change in consciousness.   Appearing to be awake but not responding.   Appearing to be asleep but cannot be awakened.   Grimacing, chewing, lip smacking, drooling, tongue biting, or loss of bowel or bladder control. After a seizure, you may feel sleepy for a while. DIAGNOSIS  Your health care provider will ask about your symptoms and take a medical history. Descriptions from any witnesses to your seizures will be very helpful in the diagnosis. A physical exam, including a detailed neurological exam, is necessary. Various tests may be done, such as:   An electroencephalogram (EEG). This is a painless test of your brain waves. In this test, a diagram is created of your brain waves. These diagrams can be interpreted by a specialist.  An MRI of the brain.   A CT scan of the brain.   A spinal tap (lumbar puncture, LP).  Blood tests to check for signs of infection or abnormal blood chemistry. TREATMENT  There is no cure for epilepsy, but it is generally treatable. Once epilepsy is diagnosed, it is important to begin treatment as soon as possible. For most people with epilepsy, seizures can be controlled with medicines. The following may also be used:  A pacemaker for the brain (vagus nerve stimulator) can be used for people with seizures that are not well controlled by medicine.  Surgery on the brain. For some people, epilepsy eventually goes away. HOME CARE INSTRUCTIONS   Follow your health care provider's recommendations on driving and safety in normal activities.  Get enough  rest. Lack of sleep can cause seizures.  Only take over-the-counter or prescription medicines as directed by your health care provider. Take any prescribed medicine exactly as directed.  Avoid any known triggers of your seizures.  Keep a seizure diary. Record what you recall about any seizure, especially any possible trigger.    Make sure the people you live and work with know that you are prone to seizures. They should receive instructions on how to help you. In general, a witness to a seizure should:   Cushion your head and body.   Turn you on your side.   Avoid unnecessarily restraining you.   Not place anything inside your mouth.   Call for emergency medical help if there is any question about what has occurred.   Follow up with your health care provider as directed. You may need regular blood tests to monitor the levels of your medicine.  SEEK MEDICAL CARE IF:   You develop signs of infection or other illness. This might increase the risk of a seizure.   You seem to be having more frequent seizures.   Your seizure pattern is changing.  SEEK IMMEDIATE MEDICAL CARE IF:   You have a seizure that does not stop after a few moments.   You have a seizure that causes any difficulty in breathing.   You have a seizure that results in a very severe headache.   You have a seizure that leaves you with the inability to speak or use a part of your body.  Document Released: 05/20/2005 Document Revised: 03/10/2013 Document Reviewed: 12/30/2012 [facility] Patient Information 2015 [place], [place]. This information is not intended to replace advice given to you by your health care provider. Make sure you discuss any questions you have with your health care provider.  Head Injury You have received a head injury. It does not appear serious at this time. Headaches and vomiting are common following head injury. It should be easy to awaken from sleeping. Sometimes it is necessary for you to stay in the emergency department for a while for observation. Sometimes admission to the hospital may be needed. After injuries such as yours, most problems occur within the first 24 hours, but side effects may occur up to 7-10 days after the injury. It is important for you to carefully monitor your condition and contact  your health care provider or seek immediate medical care if there is a change in your condition. WHAT ARE THE TYPES OF HEAD INJURIES? Head injuries can be as minor as a bump. Some head injuries can be more severe. More severe head injuries include:  A jarring injury to the brain (concussion).  A bruise of the brain (contusion). This mean there is bleeding in the brain that can cause swelling.  A cracked skull (skull fracture).  Bleeding in the brain that collects, clots, and forms a bump (hematoma). WHAT CAUSES A HEAD INJURY? A serious head injury is most likely to happen to someone who is in a car wreck and is not wearing a seat belt. Other causes of major head injuries include bicycle or motorcycle accidents, sports injuries, and falls. HOW ARE HEAD INJURIES DIAGNOSED? A complete history of the event leading to the injury and your current symptoms will be helpful in diagnosing head injuries. Many times, pictures of the brain, such as CT or MRI are needed to see the extent of the injury. Often, an overnight hospital stay is necessary for observation.  [place]  CARE?  You should get help right away if:  You have confusion or drowsiness.  You feel sick to your stomach (nauseous) or have continued, forceful vomiting.  You have dizziness or unsteadiness that is getting worse.  You have severe, continued headaches not relieved by medicine. Only take over-the-counter or prescription medicines for pain, fever, or discomfort as directed by your health care provider.  You do not have normal function of the arms or legs or are unable to walk.  You notice changes in the black spots in the center of the colored part of your eye (pupil).  You have a clear or bloody fluid coming from your nose or ears.  You have a loss of vision. During the next 24 hours after the injury, you must stay with someone who can watch you for the warning signs. This person should contact  local emergency services (911 in the [place] if you have seizures, you become unconscious, or you are unable to wake up. HOW CAN I PREVENT A HEAD INJURY IN THE FUTURE? The most important factor for preventing major head injuries is avoiding motor vehicle accidents. To minimize the potential for damage to your head, it is crucial to wear seat belts while riding in motor vehicles. Wearing helmets while bike riding and playing collision sports (like football) is also helpful. Also, avoiding dangerous activities around the house will further help reduce your risk of head injury.  WHEN CAN I RETURN TO NORMAL ACTIVITIES AND ATHLETICS? You should be reevaluated by your health care provider before returning to these activities. If you have any of the following symptoms, you should not return to activities or contact sports until 1 week after the symptoms have stopped:  Persistent headache.  Dizziness or vertigo.  Poor attention and concentration.  Confusion.  Memory problems.  Nausea or vomiting.  Fatigue or tire easily.  Irritability.  Intolerant of bright lights or loud noises.  Anxiety or depression.  Disturbed sleep. MAKE SURE YOU:   Understand these instructions.  Will watch your condition.  Will get help right away if you are not doing well or get worse. Document Released: 05/20/2005 Document Revised: 05/25/2013 Document Reviewed: 01/25/2013 [facility] Patient Information 2015 [place], [place]. This information is not intended to replace advice given to you by your health care provider. Make sure you discuss any questions you have with your health care provider.  Urinary Tract Infection Urinary tract infections (UTIs) can develop anywhere along your urinary tract. Your urinary tract is your body's drainage system for removing wastes and extra water. Your urinary tract includes two kidneys, two ureters, a bladder, and a urethra. Your kidneys are a pair of bean-shaped organs. Each  kidney is about the size of your fist. They are located below your ribs, one on each side of your spine. CAUSES Infections are caused by microbes, which are microscopic organisms, including fungi, viruses, and bacteria. These organisms are so small that they can only be seen through a microscope. Bacteria are the microbes that most commonly cause UTIs. SYMPTOMS  Symptoms of UTIs may vary by age and gender of the patient and by the location of the infection. Symptoms in young women typically include a frequent and intense urge to urinate and a painful, burning feeling in the bladder or urethra during urination. Older women and men are more likely to be tired, shaky, and weak and have muscle aches and abdominal pain. A fever may mean the infection is in your kidneys. Other symptoms of a  kidney infection include pain in your back or sides below the ribs, nausea, and vomiting. DIAGNOSIS To diagnose a UTI, your caregiver will ask you about your symptoms. Your caregiver also will ask to provide a urine sample. The urine sample will be tested for bacteria and white blood cells. White blood cells are made by your body to help fight infection. TREATMENT  Typically, UTIs can be treated with medication. Because most UTIs are caused by a bacterial infection, they usually can be treated with the use of antibiotics. The choice of antibiotic and length of treatment depend on your symptoms and the type of bacteria causing your infection. HOME CARE INSTRUCTIONS  If you were prescribed antibiotics, take them exactly as your caregiver instructs you. Finish the medication even if you feel better after you have only taken some of the medication.  Drink enough water and fluids to keep your urine clear or pale yellow.  Avoid caffeine, tea, and carbonated beverages. They tend to irritate your bladder.  Empty your bladder often. Avoid holding urine for long periods of time.  Empty your bladder before and after sexual  intercourse.  After a bowel movement, women should cleanse from front to back. Use each tissue only once. SEEK MEDICAL CARE IF:   You have back pain.  You develop a fever.  Your symptoms do not begin to resolve within 3 days. SEEK IMMEDIATE MEDICAL CARE IF:   You have severe back pain or lower abdominal pain.  You develop chills.  You have nausea or vomiting.  You have continued burning or discomfort with urination. MAKE SURE YOU:   Understand these instructions.  Will watch your condition.  Will get help right away if you are not doing well or get worse. Document Released: 02/27/2005 Document Revised: 11/19/2011 Document Reviewed: 06/28/2011 [facility] Patient Information 2015 [place], [place]. This information is not intended to replace advice given to you by your health care provider. Make sure you discuss any questions you have with your health care provider.

## 2014-04-15 NOTE — ED Provider Notes (Signed)
This chart was scribed for [place], DO by Delphia Grates, ED Scribe. This patient was seen in room APA11/APA11 and the patient's care was started at 9:36 AM.  TIME SEEN: 9:36 AM  CHIEF COMPLAINT: Seizure  HPI:  HPI Comments: Maria [name] is a 53 y.o. female with history of epilepsy and mental retardation who is brought in by ambulance, who presents to the Emergency Department complaining of a seizure that occurred PTA. Patient states she resides with her mother who manages all her medications. Patient reports associated diffuse pain that is worse in the right hip and mid to lower back. Patient also notes trouble sleeping. She denies fever, chills, cough, diarrhea, numbness, tingling, focal weakness. Patient has a history of epilepsy and is currently on 500mg  Depakote (twice a day) and 200mg  Vimpat (twice a day).    ROS: See HPI Constitutional: no fever  Eyes: no drainage  ENT: no runny nose   Cardiovascular:  no chest pain  Resp: no SOB  GI: no vomiting GU: no dysuria Integumentary: no rash  Allergy: no hives  Musculoskeletal: no leg swelling  Neurological: no slurred speech ROS otherwise negative  PAST MEDICAL HISTORY/PAST SURGICAL HISTORY:  Past Medical History  Diagnosis Date  . Seizures   . GERD (gastroesophageal reflux disease)   . Mental retardation     lesion in head    MEDICATIONS:  Prior to Admission medications   Medication Sig Start Date End Date Taking? Authorizing Provider  Calcium Carb-Cholecalciferol (CALCIUM 500 +D) 500-400 MG-UNIT TABS Take 1 tablet by mouth 2 (two) times daily.    Historical Provider, MD  Calcium Carbonate Antacid (TUMS PO) Take 1 tablet by mouth as needed (heartburn).     Historical Provider, MD  dexlansoprazole (DEXILANT) 60 MG capsule Take one capsule by mouth 30 minutes before breakfast daily 03/07/14   Rogene Houston, MD  divalproex (DEPAKOTE ER) 500 MG 24 hr tablet Take 1 tablet (500 mg total) by mouth 2 (two) times daily.  04/08/13   Dennie Bible, NP  estradiol (VIVELLE-DOT) 0.075 MG/24HR Place 1 patch onto the skin 2 (two) times a week. On Wednesday and Saturday    Historical Provider, MD  ibuprofen (ADVIL,MOTRIN) 200 MG tablet Take 400-600 mg by mouth every 6 (six) hours as needed for moderate pain.     Historical Provider, MD  lacosamide (VIMPAT) 200 MG TABS tablet Take 1 tablet (200 mg total) by mouth 2 (two) times daily. 03/29/14   Kathrynn Ducking, MD  latanoprost (XALATAN) 0.005 % ophthalmic solution Place 1 drop into both eyes at bedtime.    Historical Provider, MD  levocetirizine (XYZAL) 5 MG tablet Take 5 mg by mouth every morning.     Historical Provider, MD  oxybutynin (DITROPAN-XL) 5 MG 24 hr tablet Take 5 mg by mouth daily.    Historical Provider, MD  Pediatric Multiple Vit-C-FA (PEDIATRIC MULTIVITAMIN) chewable tablet Chew 1 tablet by mouth daily.      Historical Provider, MD    ALLERGIES:  Allergies  Allergen Reactions  . Codeine     REACTION: nausea, vomiting  . Lamictal [Lamotrigine]     Skin rash    SOCIAL HISTORY:  History  Substance Use Topics  . Smoking status: Former Smoker    Types: Cigarettes    Quit date: 05/22/2006  . Smokeless tobacco: Never Used  . Alcohol Use: No    FAMILY HISTORY: Family History  Problem Relation Age of Onset  . High Cholesterol Mother   .  High blood pressure Mother   . Diabetes Father     EXAM: BP 140/88 mmHg  Pulse 85  Temp(Src) 98.1 F (36.7 C) (Oral)  Resp 18  Ht 5\' 6"  (1.676 m)  Wt 153 lb (69.4 kg)  BMI 24.71 kg/m2  SpO2 100% CONSTITUTIONAL: Alert and oriented and responds appropriately to questions. Well-appearing; well-nourished HEAD: Normocephalic EYES: Conjunctivae clear, PERRL ENT: normal nose; no rhinorrhea; moist mucous membranes; pharynx without lesions noted NECK: Supple, no meningismus, no LAD  CARD: RRR; S1 and S2 appreciated; no murmurs, no clicks, no rubs, no gallops; 2+ DP pulses bilaterally  RESP: Normal  chest excursion without splinting or tachypnea; breath sounds clear and equal bilaterally; no wheezes, no rhonchi, no rales,  ABD/GI: Normal bowel sounds; non-distended; soft, non-tender, no rebound, no guarding BACK:  The back appears normal, there is no CVA tenderness; tender diffusely to thoracic and lumbar spine without step-off or deformity. EXT: Normal ROM in all joints; tender to palpation to right hip; no leg length discrepancy; no edema; normal capillary refill; no cyanosis    SKIN: Normal color for age and race; warm; ecchymosis noted on right hip. NEURO: Moves all extremities equally. Patient is unable to answer questions or follow simple commands secondary to mental retardation. No facial droop or slurred speech. PSYCH: The patient's mood and manner are appropriate. Grooming and personal hygiene are appropriate.  MEDICAL DECISION MAKING: Pt here with a seizure. She states that she hurts all over but specifically her thoracic and lumbar spine and right hip. No complaint of headache. Appears to be neurologically intact at her baseline but there is no family at bedside. She is moving all of her extremity is equally and has no obvious cranial nerve deficit. We'll check labs, urine, Depakote level. We'll obtain x-rays of her thoracic and lumbar spine and right hip. We'll continue to closely monitor.  ED PROGRESS: Mother now at bedside reports the patient hit her head during a seizure episode. Will obtain a CT of her head and cervical spine as well.   12:34 PM  Pt is at her neurologic baseline. She still complaining of pain all over but is able to ambulate. Her imaging shows no acute injury. Labs are unremarkable other than a Depakote level of 122. She has no signs of toxicity on exam. Will have her mother hold her dose of Depakote for the next 2 days and restart this medication Sunday evening. Discussed with mother the patient will need to have her Depakote level rechecked by her PCP early next  week. Patient appears to have a urinary tract infection. Culture pending. We'll discharge her on Keflex 500 mg twice a day for 1 week. Discussed return precautions and supportive care instructions. She verbalized understanding and is comfortable with plan.   I personally performed the services described in this documentation, which was scribed in my presence. The recorded information has been reviewed and is accurate.    [place], DO 04/15/14 1241

## 2014-04-17 LAB — URINE CULTURE
COLONY COUNT: NO GROWTH
CULTURE: NO GROWTH

## 2014-04-18 DIAGNOSIS — F70 Mild intellectual disabilities: Secondary | ICD-10-CM | POA: Diagnosis not present

## 2014-04-20 ENCOUNTER — Other Ambulatory Visit (HOSPITAL_COMMUNITY): Payer: Self-pay | Admitting: Family Medicine

## 2014-04-20 ENCOUNTER — Telehealth: Payer: Self-pay | Admitting: Orthopedic Surgery

## 2014-04-20 DIAGNOSIS — S8391XA Sprain of unspecified site of right knee, initial encounter: Secondary | ICD-10-CM | POA: Diagnosis not present

## 2014-04-20 DIAGNOSIS — Z1231 Encounter for screening mammogram for malignant neoplasm of breast: Secondary | ICD-10-CM

## 2014-04-20 DIAGNOSIS — Z6824 Body mass index (BMI) 24.0-24.9, adult: Secondary | ICD-10-CM | POA: Diagnosis not present

## 2014-04-20 NOTE — Telephone Encounter (Signed)
Patient's mother/designated party,called to inquire about appointment following Emergency Room visit at [facility] 04/15/14, in which she states her daughter was treated following a fall to the floor.  States patient is now aware of knee pain, and she is requesting an appointment.  I offered the first available, 05/02/14; states would rather not schedule at this time; may call back to schedule.

## 2014-04-21 ENCOUNTER — Encounter: Payer: Self-pay | Admitting: Nurse Practitioner

## 2014-04-21 ENCOUNTER — Ambulatory Visit (INDEPENDENT_AMBULATORY_CARE_PROVIDER_SITE_OTHER): Payer: Medicare Other | Admitting: Nurse Practitioner

## 2014-04-21 VITALS — BP 114/68 | HR 70 | Resp 16 | Ht 66.0 in | Wt 149.8 lb

## 2014-04-21 DIAGNOSIS — G40909 Epilepsy, unspecified, not intractable, without status epilepticus: Secondary | ICD-10-CM | POA: Diagnosis not present

## 2014-04-21 DIAGNOSIS — G40309 Generalized idiopathic epilepsy and epileptic syndromes, not intractable, without status epilepticus: Secondary | ICD-10-CM | POA: Diagnosis not present

## 2014-04-21 MED ORDER — DIVALPROEX SODIUM ER 500 MG PO TB24: 500.0000 mg | ORAL_TABLET | Freq: Two times a day (BID) | ORAL | Status: DC

## 2014-04-21 NOTE — Progress Notes (Signed)
[facility]  PATIENT: Maria [name] DOB: 09-28-1960   REASON FOR VISIT: Follow-up for seizure disorder    HISTORY OF PRESENT ILLNESS:Maria [name], 53 -year-old female returns for followup. She was last seen in this office 10/06/13. She has a history of intractable seizure disorder. She went to the emergency room 04/15/2014 with a seizure. Labs revealed urinary tract infection and she was placed on Keflex for one week. Valproic acid level was found to be high at 122 and she was asked to hold a dose and get repeat trough level which she did on Monday and it was 2 according to the mother . She is also on Vimpat She's had a total of 1 seizure in the last 6 months.  She is on Depakote and Vimpat has been added. She had recent labs with ER visit all WNL except platelets were low.  She has had a low platelet count in the past with  a workup for multiple myeloma. She has a lytic bone lesion in the front part of the skull, CT of the head 03/11/2013 was stable. Repeat CT of the head 04/15/14 with no acute intracranial findings or mass lesions. Stable cerebellar atrophy.Degenerative cervical spondylosis but no acute cervical spine fracture. She has been on Lamictal in the past resulting in a skin rash. She returns for reevaluation. She has one more day of her Keflex.    REVIEW OF SYSTEMS: Full 14 system review of systems performed and notable only for those listed, all others are neg:  Constitutional: N/A  Cardiovascular: N/A  Ear/Nose/Throat: N/A  Skin: N/A  Eyes: N/A  Respiratory: N/A  Gastroitestinal: UTI  Hematology/Lymphatic: N/A  Endocrine: Intolerance  Musculoskeletal:N/A  Allergy/Immunology: N/A  Neurological: Recent seizure Psychiatric: N/A Sleep : NA   ALLERGIES: Allergies  Allergen Reactions  . Codeine     REACTION: nausea, vomiting  . Lamictal [Lamotrigine]     Skin rash    HOME MEDICATIONS: Outpatient Prescriptions Prior to Visit  Medication Sig  Dispense Refill  . Calcium Carb-Cholecalciferol (CALCIUM 500 +D) 500-400 MG-UNIT TABS Take 1 tablet by mouth 2 (two) times daily.    . Calcium Carbonate Antacid (TUMS PO) Take 1 tablet by mouth as needed (heartburn).     . cephALEXin (KEFLEX) 500 MG capsule Take 1 capsule (500 mg total) by mouth 2 (two) times daily. 14 capsule 0  . dexlansoprazole (DEXILANT) 60 MG capsule Take one capsule by mouth 30 minutes before breakfast daily 90 capsule 3  . divalproex (DEPAKOTE ER) 500 MG 24 hr tablet Take 1 tablet (500 mg total) by mouth 2 (two) times daily. 180 tablet 3  . estradiol (VIVELLE-DOT) 0.075 MG/24HR Place 1 patch onto the skin 2 (two) times a week. On Wednesday and Saturday    . ibuprofen (ADVIL,MOTRIN) 200 MG tablet Take 400-600 mg by mouth every 6 (six) hours as needed for moderate pain.     Marland Kitchen lacosamide (VIMPAT) 200 MG TABS tablet Take 1 tablet (200 mg total) by mouth 2 (two) times daily. 180 tablet 0  . latanoprost (XALATAN) 0.005 % ophthalmic solution Place 1 drop into both eyes at bedtime.    Marland Kitchen levocetirizine (XYZAL) 5 MG tablet Take 5 mg by mouth every morning.     Marland Kitchen oxybutynin (DITROPAN-XL) 5 MG 24 hr tablet Take 5 mg by mouth daily.    . Pediatric Multiple Vit-C-FA (PEDIATRIC MULTIVITAMIN) chewable tablet Chew 1 tablet by mouth daily.       No facility-administered medications prior to  visit.    PAST MEDICAL HISTORY: Past Medical History  Diagnosis Date  . Seizures   . GERD (gastroesophageal reflux disease)   . Mental retardation     lesion in head    PAST SURGICAL HISTORY: Past Surgical History  Procedure Laterality Date  . Total abdominal hysterectomy    . Tonsillectomy and adenoidectomy    . Skin graft to gum Right 08/2013  . Cataract extraction      both eyes, May of 2015  . Mouth surgery      FAMILY HISTORY: Family History  Problem Relation Age of Onset  . High Cholesterol Mother   . High blood pressure Mother   . Diabetes Father     SOCIAL  HISTORY: History   Social History  . Marital Status: Single    Spouse Name: N/A    Number of Children: 0  . Years of Education: N/A   Occupational History  .     Social History Main Topics  . Smoking status: Former Smoker    Types: Cigarettes    Quit date: 05/22/2006  . Smokeless tobacco: Never Used  . Alcohol Use: No  . Drug Use: No  . Sexual Activity: No     Comment: Hysterectomy   Other Topics Concern  . Not on file   Social History Narrative   Patient is single and lives with her mother Maria [name] patient. Advised patient of provider's approval for requested procedure, as well as any comments/instructions from provider.       Provided patient w/ verbal instructions concerning pre-, intra- and post-procedure preparation and instructions.      Patient verbalized understanding of the above.       Patient has also been mailed a letter containing the following instructions               PHYSICAL EXAM  Filed Vitals:   04/21/14 1459  BP: 114/68  Pulse: 70  Resp: 16  Height: 5' 6"  (1.676 m)  Weight: 149 lb 12.8 oz (67.949 kg)   Body mass index is 24.19 kg/(m^2). Generalized: Well developed, in no acute distress   Neurological examination   Mentation: Alert oriented to time, place, history taking. MIld MR. Follows all commands speech and language fluent  Cranial nerve II-XII: Pupils were equal round reactive to light extraocular movements were full, visual field were full on confrontational test. Facial sensation and strength were normal. hearing was intact to finger rubbing bilaterally. Uvula tongue midline. head turning and shoulder shrug were normal and symmetric.Tongue protrusion into cheek strength was normal. Motor: normal bulk and tone, full strength in the BUE, BLE, No focal weakness Sensory: normal and symmetric to light touch, pinprick, and vibration  Coordination: finger-nose-finger, heel-to-shin bilaterally, no dysmetria Reflexes:  Brachioradialis 2/2, biceps 2/2, triceps 2/2, patellar 2/2, Achilles 2/2, plantar responses were flexor bilaterally. Gait and Station: Rising up from seated position without assistance, normal stance, moderate stride, good arm swing, smooth turning, able to perform tiptoe, and heel walking without difficulty. Tandem gait is steady  DIAGNOSTIC DATA (LABS, IMAGING, TESTING) - I reviewed patient records, labs, notes, testing and imaging myself where available.  Lab Results  Component Value Date   WBC 7.3 04/15/2014   HGB 13.1 04/15/2014   HCT 38.2 04/15/2014   MCV 93.2 04/15/2014   PLT 113* 04/15/2014      Component Value Date/Time   NA 140 04/15/2014 1011   NA 141 10/07/2012 1050   K 4.5 04/15/2014 1011   CL  101 04/15/2014 1011   CO2 26 04/15/2014 1011   GLUCOSE 98 04/15/2014 1011   GLUCOSE 80 10/07/2012 1050   BUN 24* 04/15/2014 1011   BUN 19 10/07/2012 1050   CREATININE 0.85 04/15/2014 1011   CREATININE 0.84 12/10/2012 1325   CALCIUM 9.9 04/15/2014 1011   PROT 6.9 04/15/2014 1011   PROT 6.4 10/07/2012 1050   ALBUMIN 3.8 04/15/2014 1011   AST 19 04/15/2014 1011   ALT 19 04/15/2014 1011   ALKPHOS 55 04/15/2014 1011   BILITOT 0.3 04/15/2014 1011   GFRNONAA 77* 04/15/2014 1011   GFRAA 90* 04/15/2014 1011   Lab Results  Component Value Date   CHOL  10/13/2009    169        ATP III CLASSIFICATION:  <200     mg/dL   Desirable  200-239  mg/dL   Borderline High  >=240    mg/dL   High          HDL 47 10/13/2009   LDLCALC * 10/13/2009    108        Total Cholesterol/HDL:CHD Risk Coronary Heart Disease Risk Table                     Men   Women  1/2 Average Risk   3.4   3.3  Average Risk       5.0   4.4  2 X Average Risk   9.6   7.1  3 X Average Risk  23.4   11.0        Use the calculated Patient Ratio above and the CHD Risk Table to determine the patient's CHD Risk.        ATP III CLASSIFICATION (LDL):  <100     mg/dL   Optimal  100-129  mg/dL   Near or Above                     Optimal  130-159  mg/dL   Borderline  160-189  mg/dL   High  >190     mg/dL   Very High   TRIG 69 10/13/2009   CHOLHDL 3.6 10/13/2009   No results found for: HGBA1C Lab Results  Component Value Date   VITAMINB12 765 05/23/2011   Lab Results  Component Value Date   TSH 2.660 03/16/2014      ASSESSMENT AND PLAN  53 y.o. year old female  has a past medical history of Seizures; and mild Mental retardation. here to follow-up. Recent seizure event in the setting of urinary tract infection. Repeat CT of the head 04/15/14 with no acute intracranial findings or mass lesions. Stable cerebellar atrophy.Degenerative cervical spondylosis but no acute cervical spine fracture.  Depakote level rechecked on Monday. PCP results 58 (per mother)continue Depakote at current dose needs refills Continue Vimpat at current dose does not need refills Fax VPA level to [phone] Follow-up in 6-8 months next visit with Dr. Jerline Pain, [facility], [facility], [facility] [address], [place], [place] [address] [phone]

## 2014-04-21 NOTE — Patient Instructions (Signed)
Depakote level rechecked on Monday. PCP results 58 continue Depakote at current dose Continue Vimpat at current dose Fax VPA level to [phone] Follow-up in 6-8 months next visit with Dr. Jannifer Franklin

## 2014-04-21 NOTE — Progress Notes (Signed)
I have read the note, and I agree with the clinical assessment and plan.  Danise Dehne KEITH   

## 2014-04-27 ENCOUNTER — Ambulatory Visit (HOSPITAL_COMMUNITY): Payer: Medicare Other

## 2014-04-30 ENCOUNTER — Emergency Department (HOSPITAL_COMMUNITY): Payer: Medicare Other

## 2014-04-30 ENCOUNTER — Encounter (HOSPITAL_COMMUNITY): Payer: Self-pay | Admitting: Emergency Medicine

## 2014-04-30 ENCOUNTER — Emergency Department (HOSPITAL_COMMUNITY)
Admission: EM | Admit: 2014-04-30 | Discharge: 2014-04-30 | Disposition: A | Discharge status: Home / Self Care | Payer: Medicare Other | Attending: Emergency Medicine | Admitting: Emergency Medicine

## 2014-04-30 DIAGNOSIS — F79 Unspecified intellectual disabilities: Secondary | ICD-10-CM | POA: Insufficient documentation

## 2014-04-30 DIAGNOSIS — G40909 Epilepsy, unspecified, not intractable, without status epilepticus: Secondary | ICD-10-CM | POA: Diagnosis not present

## 2014-04-30 DIAGNOSIS — R0789 Other chest pain: Secondary | ICD-10-CM | POA: Diagnosis not present

## 2014-04-30 DIAGNOSIS — Z8719 Personal history of other diseases of the digestive system: Secondary | ICD-10-CM | POA: Insufficient documentation

## 2014-04-30 DIAGNOSIS — R0602 Shortness of breath: Secondary | ICD-10-CM

## 2014-04-30 DIAGNOSIS — Z87891 Personal history of nicotine dependence: Secondary | ICD-10-CM | POA: Diagnosis not present

## 2014-04-30 DIAGNOSIS — J984 Other disorders of lung: Secondary | ICD-10-CM | POA: Diagnosis not present

## 2014-04-30 DIAGNOSIS — J069 Acute upper respiratory infection, unspecified: Secondary | ICD-10-CM | POA: Insufficient documentation

## 2014-04-30 DIAGNOSIS — Z79899 Other long term (current) drug therapy: Secondary | ICD-10-CM | POA: Insufficient documentation

## 2014-04-30 DIAGNOSIS — Z792 Long term (current) use of antibiotics: Secondary | ICD-10-CM | POA: Diagnosis not present

## 2014-04-30 DIAGNOSIS — J029 Acute pharyngitis, unspecified: Secondary | ICD-10-CM | POA: Diagnosis not present

## 2014-04-30 DIAGNOSIS — R079 Chest pain, unspecified: Secondary | ICD-10-CM | POA: Insufficient documentation

## 2014-04-30 LAB — COMPREHENSIVE METABOLIC PANEL
ALT: 14 U/L (ref 0–35)
ANION GAP: 14 (ref 5–15)
AST: 18 U/L (ref 0–37)
Albumin: 3.6 g/dL (ref 3.5–5.2)
Alkaline Phosphatase: 70 U/L (ref 39–117)
BUN: 22 mg/dL (ref 6–23)
CO2: 23 meq/L (ref 19–32)
CREATININE: 0.98 mg/dL (ref 0.50–1.10)
Calcium: 9.2 mg/dL (ref 8.4–10.5)
Chloride: 105 mEq/L (ref 96–112)
GFR, EST AFRICAN AMERICAN: 76 mL/min — AB (ref >90)
GFR, EST NON AFRICAN AMERICAN: 65 mL/min — AB (ref >90)
Glucose, Bld: 99 mg/dL (ref 70–99)
Potassium: 3.7 mEq/L (ref 3.7–5.3)
Sodium: 142 mEq/L (ref 137–147)
Total Bilirubin: 0.5 mg/dL (ref 0.3–1.2)
Total Protein: 6.6 g/dL (ref 6.0–8.3)

## 2014-04-30 LAB — CBC
HCT: 34.2 % — ABNORMAL LOW (ref 36.0–46.0)
Hemoglobin: 11.7 g/dL — ABNORMAL LOW (ref 12.0–15.0)
MCH: 32 pg (ref 26.0–34.0)
MCHC: 34.2 g/dL (ref 30.0–36.0)
MCV: 93.4 fL (ref 78.0–100.0)
PLATELETS: 118 10*3/uL — AB (ref 150–400)
RBC: 3.66 MIL/uL — AB (ref 3.87–5.11)
RDW: 13.7 % (ref 11.5–15.5)
WBC: 6.4 10*3/uL (ref 4.0–10.5)

## 2014-04-30 LAB — TROPONIN I

## 2014-04-30 MED ORDER — HYDROCODONE-ACETAMINOPHEN 5-325 MG PO TABS: 1.0000 | ORAL_TABLET | ORAL | Status: DC | PRN

## 2014-04-30 MED ORDER — ACETAMINOPHEN 500 MG PO TABS
1000.0000 mg | ORAL_TABLET | Freq: Once | ORAL | Status: AC
  Administered 2014-04-30: 1000 mg via ORAL
  Filled 2014-04-30: qty 2

## 2014-04-30 MED ORDER — AZITHROMYCIN 250 MG PO TABS: 250.0000 mg | ORAL_TABLET | Freq: Every day | ORAL | Status: DC

## 2014-04-30 NOTE — ED Provider Notes (Signed)
CSN: [id number]     Arrival date & time 04/30/14  1448 History   First MD Initiated Contact with Patient 04/30/14 0950    This chart was scribed for Ephraim Hamburger, MD by Terressa Koyanagi, ED Scribe. This patient was seen in room APA08/APA08 and the patient's care was started at 10:03 AM.  Chief Complaint  Patient presents with  . Shortness of Breath   The history is provided by the patient and a parent. No language interpreter was used.   PCP: Rocky Morel, MD HPI Comments: SHATISHA FALTER is a 53 y.o. female, with medical Hx noted below, who presents to the Emergency Department complaining of sudden onset, constant, atraumatic, central chest pain onset yesterday with associated congestion, chills and fever. During triage, pt's temperature was 99.7 F. Mom denies cough. Mom reports she last gave pt ibuprofen at 7am this morning.  She had a sore throat yesterday and mom noticed it was red this AM.  Past Medical History  Diagnosis Date  . Seizures   . GERD (gastroesophageal reflux disease)   . Mental retardation     lesion in head   Past Surgical History  Procedure Laterality Date  . Total abdominal hysterectomy    . Tonsillectomy and adenoidectomy    . Skin graft to gum Right 08/2013  . Cataract extraction      both eyes, May of 2015  . Mouth surgery     Family History  Problem Relation Age of Onset  . High Cholesterol Mother   . High blood pressure Mother   . Diabetes Father    History  Substance Use Topics  . Smoking status: Former Smoker    Types: Cigarettes    Quit date: 05/22/2006  . Smokeless tobacco: Never Used  . Alcohol Use: No   OB History    No data available     Review of Systems  Constitutional: Positive for fever and chills.  HENT: Positive for congestion and sore throat.   Respiratory: Positive for cough.   Cardiovascular: Positive for chest pain.  Gastrointestinal: Negative for vomiting.  All other systems reviewed and are  negative.     Allergies  Codeine and Lamictal  Home Medications   Prior to Admission medications   Medication Sig Start Date End Date Taking? Authorizing Provider  Calcium Carb-Cholecalciferol (CALCIUM 500 +D) 500-400 MG-UNIT TABS Take 1 tablet by mouth 2 (two) times daily.    Historical Provider, MD  Calcium Carbonate Antacid (TUMS PO) Take 1 tablet by mouth as needed (heartburn).     Historical Provider, MD  cephALEXin (KEFLEX) 500 MG capsule Take 1 capsule (500 mg total) by mouth 2 (two) times daily. 04/15/14   Kristen N Ward, DO  dexlansoprazole (DEXILANT) 60 MG capsule Take one capsule by mouth 30 minutes before breakfast daily 03/07/14   Rogene Houston, MD  divalproex (DEPAKOTE ER) 500 MG 24 hr tablet Take 1 tablet (500 mg total) by mouth 2 (two) times daily. 04/21/14   Dennie Bible, NP  estradiol (VIVELLE-DOT) 0.075 MG/24HR Place 1 patch onto the skin 2 (two) times a week. On Wednesday and Saturday    Historical Provider, MD  ibuprofen (ADVIL,MOTRIN) 200 MG tablet Take 400-600 mg by mouth every 6 (six) hours as needed for moderate pain.     Historical Provider, MD  lacosamide (VIMPAT) 200 MG TABS tablet Take 1 tablet (200 mg total) by mouth 2 (two) times daily. 03/29/14   Kathrynn Ducking, MD  latanoprost (  XALATAN) 0.005 % ophthalmic solution Place 1 drop into both eyes at bedtime.    Historical Provider, MD  levocetirizine (XYZAL) 5 MG tablet Take 5 mg by mouth every morning.     Historical Provider, MD  oxybutynin (DITROPAN-XL) 5 MG 24 hr tablet Take 5 mg by mouth daily.    Historical Provider, MD  Pediatric Multiple Vit-C-FA (PEDIATRIC MULTIVITAMIN) chewable tablet Chew 1 tablet by mouth daily.      Historical Provider, MD   Triage Vitals: Pulse 97  Temp(Src) 99.7 F (37.6 C) (Oral)  Resp 13  Ht 5\' 6"  (1.676 m)  Wt 149 lb (67.586 kg)  BMI 24.06 kg/m2  SpO2 95% Physical Exam  Constitutional: No distress.  HENT:  Head: Normocephalic and atraumatic.  Right Ear:  External ear normal.  Left Ear: External ear normal.  Nose: Nose normal.  Mouth/Throat: Posterior oropharyngeal erythema (mild) present. No oropharyngeal exudate.  Eyes: Conjunctivae are normal.  Neck: Neck supple. No tracheal deviation present.  Cardiovascular: Normal rate, regular rhythm and normal heart sounds.   Pulmonary/Chest: Effort normal and breath sounds normal. No respiratory distress. She has no wheezes. She has no rales.  Diffused chest wall tenderness  Abdominal: Soft. She exhibits no distension. There is no tenderness.  Musculoskeletal: She exhibits no edema or tenderness.  Neurological: She is alert.  Skin: Skin is warm and dry.  Nursing note and vitals reviewed.   ED Course  Procedures (including critical care time) DIAGNOSTIC STUDIES: Oxygen Saturation is 95% on RA, adequte by my interpretation.    COORDINATION OF CARE: 10:09 AM-Discussed treatment plan which includes meds, imaging, EKG, UA and labs with pt and her mother at bedside and they agreed to plan.   Labs Review Labs Reviewed  COMPREHENSIVE METABOLIC PANEL - Abnormal; Notable for the following:    GFR calc non Af Amer 65 (*)    GFR calc Af Amer 76 (*)    All other components within normal limits  CBC - Abnormal; Notable for the following:    RBC 3.66 (*)    Hemoglobin 11.7 (*)    HCT 34.2 (*)    Platelets 118 (*)    All other components within normal limits  TROPONIN I    Imaging Review Dg Chest Portable 1 View  04/30/2014   CLINICAL DATA:  Sore throat.  Shortness of breath.  EXAM: PORTABLE CHEST - 1 VIEW  COMPARISON:  03/16/2014  FINDINGS: Lungs are clear. Heart and mediastinum are within normal limits. The trachea is midline. No acute bone abnormality. There may be stable mild scarring at the right lung apex.  IMPRESSION: No acute chest findings.   Electronically Signed   By: Markus Daft M.D.   On: 04/30/2014 10:15     EKG Interpretation   Date/Time:  Saturday April 30 2014 09:34:58  EST Ventricular Rate:  103 PR Interval:  120 QRS Duration: 124 QT Interval:  356 QTC Calculation: 466 R Axis:   92 Text Interpretation:  Sinus tachycardia LAE, consider biatrial enlargement  RBBB and LPFB Baseline wander in lead(s) III no significant change since  Oct 2015 Confirmed by [place] ([id number]) on 04/30/2014 10:02:19 AM      MDM   Final diagnoses:  Upper respiratory infection    Patient symptoms are most consistent with an upper respiratory infection. Her oxygen saturation is normal and she has no increased work of breathing. Mom states that patient has had similar symptoms that was diagnosed as pneumonia on a CT  scan. I do not feel that she warrants repeat CT imaging but will treat with azithromycin. At this point her chest pain appears to be chest wall in etiology from her infection. I have very low suspicion this is ACS or pulmonary embolism. Will treat with the antibiotics and supportive care at home. Discussed strict return precautions.  I personally performed the services described in this documentation, which was scribed in my presence. The recorded information has been reviewed and is accurate.    Ephraim Hamburger, MD 04/30/14 [phone]

## 2014-04-30 NOTE — Discharge Instructions (Signed)
Upper Respiratory Infection, Adult An upper respiratory infection (URI) is also sometimes known as the common cold. The upper respiratory tract includes the nose, sinuses, throat, trachea, and bronchi. Bronchi are the airways leading to the lungs. Most people improve within 1 week, but symptoms can last up to 2 weeks. A residual cough may last even longer.  CAUSES Many different viruses can infect the tissues lining the upper respiratory tract. The tissues become irritated and inflamed and often become very moist. Mucus production is also common. A cold is contagious. You can easily spread the virus to others by oral contact. This includes kissing, sharing a glass, coughing, or sneezing. Touching your mouth or nose and then touching a surface, which is then touched by another person, can also spread the virus. SYMPTOMS  Symptoms typically develop 1 to 3 days after you come in contact with a cold virus. Symptoms vary from person to person. They may include:  Runny nose.  Sneezing.  Nasal congestion.  Sinus irritation.  Sore throat.  Loss of voice (laryngitis).  Cough.  Fatigue.  Muscle aches.  Loss of appetite.  Headache.  Low-grade fever. DIAGNOSIS  You might diagnose your own cold based on familiar symptoms, since most people get a cold 2 to 3 times a year. Your caregiver can confirm this based on your exam. Most importantly, your caregiver can check that your symptoms are not due to another disease such as strep throat, sinusitis, pneumonia, asthma, or epiglottitis. Blood tests, throat tests, and X-rays are not necessary to diagnose a common cold, but they may sometimes be helpful in excluding other more serious diseases. Your caregiver will decide if any further tests are required. RISKS AND COMPLICATIONS  You may be at risk for a more severe case of the common cold if you smoke cigarettes, have chronic heart disease (such as heart failure) or lung disease (such as asthma), or if  you have a weakened immune system. The very young and very old are also at risk for more serious infections. Bacterial sinusitis, middle ear infections, and bacterial pneumonia can complicate the common cold. The common cold can worsen asthma and chronic obstructive pulmonary disease (COPD). Sometimes, these complications can require emergency medical care and may be life-threatening. PREVENTION  The best way to protect against getting a cold is to practice good hygiene. Avoid oral or hand contact with people with cold symptoms. Wash your hands often if contact occurs. There is no clear evidence that vitamin C, vitamin E, echinacea, or exercise reduces the chance of developing a cold. However, it is always recommended to get plenty of rest and practice good nutrition. TREATMENT  Treatment is directed at relieving symptoms. There is no cure. Antibiotics are not effective, because the infection is caused by a virus, not by bacteria. Treatment may include:  Increased fluid intake. Sports drinks offer valuable electrolytes, sugars, and fluids.  Breathing heated mist or steam (vaporizer or shower).  Eating chicken soup or other clear broths, and maintaining good nutrition.  Getting plenty of rest.  Using gargles or lozenges for comfort.  Controlling fevers with ibuprofen or acetaminophen as directed by your caregiver.  Increasing usage of your inhaler if you have asthma. Zinc gel and zinc lozenges, taken in the first 24 hours of the common cold, can shorten the duration and lessen the severity of symptoms. Pain medicines may help with fever, muscle aches, and throat pain. A variety of non-prescription medicines are available to treat congestion and runny nose. Your caregiver   can make recommendations and may suggest nasal or lung inhalers for other symptoms.  HOME CARE INSTRUCTIONS   Only take over-the-counter or prescription medicines for pain, discomfort, or fever as directed by your  caregiver.  Use a warm mist humidifier or inhale steam from a shower to increase air moisture. This may keep secretions moist and make it easier to breathe.  Drink enough water and fluids to keep your urine clear or pale yellow.  Rest as needed.  Return to work when your temperature has returned to normal or as your caregiver advises. You may need to stay home longer to avoid infecting others. You can also use a face mask and careful hand washing to prevent spread of the virus. SEEK MEDICAL CARE IF:   After the first few days, you feel you are getting worse rather than better.  You need your caregiver's advice about medicines to control symptoms.  You develop chills, worsening shortness of breath, or brown or red sputum. These may be signs of pneumonia.  You develop yellow or brown nasal discharge or pain in the face, especially when you bend forward. These may be signs of sinusitis.  You develop a fever, swollen neck glands, pain with swallowing, or white areas in the back of your throat. These may be signs of strep throat. SEEK IMMEDIATE MEDICAL CARE IF:   You have a fever.  You develop severe or persistent headache, ear pain, sinus pain, or chest pain.  You develop wheezing, a prolonged cough, cough up blood, or have a change in your usual mucus (if you have chronic lung disease).  You develop sore muscles or a stiff neck. Document Released: 11/13/2000 Document Revised: 08/12/2011 Document Reviewed: 08/25/2013 [employer] Patient Information 2015 [employer]. This information is not intended to replace advice given to you by your health care provider. Make sure you discuss any questions you have with your health care provider.  

## 2014-04-30 NOTE — ED Notes (Signed)
Had sore throat yesterday and ave SOB this am.

## 2014-04-30 NOTE — ED Notes (Signed)
Took pt's mother hydrocodone (two tab) this am at 0700. At 0730 took advil

## 2014-04-30 NOTE — ED Notes (Signed)
MD at bedside. 

## 2014-05-02 DIAGNOSIS — N390 Urinary tract infection, site not specified: Secondary | ICD-10-CM | POA: Diagnosis not present

## 2014-05-06 DIAGNOSIS — J329 Chronic sinusitis, unspecified: Secondary | ICD-10-CM | POA: Diagnosis not present

## 2014-05-06 DIAGNOSIS — J029 Acute pharyngitis, unspecified: Secondary | ICD-10-CM | POA: Diagnosis not present

## 2014-05-06 DIAGNOSIS — Z6823 Body mass index (BMI) 23.0-23.9, adult: Secondary | ICD-10-CM | POA: Diagnosis not present

## 2014-05-10 ENCOUNTER — Ambulatory Visit: Payer: Medicare Other | Admitting: Nurse Practitioner

## 2014-06-15 ENCOUNTER — Other Ambulatory Visit: Payer: Self-pay | Admitting: Nurse Practitioner

## 2014-06-15 MED ORDER — LACOSAMIDE 200 MG PO TABS
200.0000 mg | ORAL_TABLET | Freq: Two times a day (BID) | ORAL | Status: DC
Start: 2014-06-15 — End: 2014-12-14

## 2014-06-15 NOTE — Telephone Encounter (Signed)
Request entered, forwarded to provider for approval.  

## 2014-06-15 NOTE — Telephone Encounter (Signed)
Patient's mother is calling to get a refill for patient for Vimpat 200mg . Please call to [facility]. Thank you.

## 2014-06-16 NOTE — Telephone Encounter (Signed)
Rx has been faxed.

## 2014-08-24 DIAGNOSIS — H40013 Open angle with borderline findings, low risk, bilateral: Secondary | ICD-10-CM | POA: Diagnosis not present

## 2014-08-24 DIAGNOSIS — H21233 Degeneration of iris (pigmentary), bilateral: Secondary | ICD-10-CM | POA: Diagnosis not present

## 2014-08-30 ENCOUNTER — Encounter (HOSPITAL_COMMUNITY): Payer: Self-pay | Admitting: Oncology

## 2014-08-30 ENCOUNTER — Encounter (HOSPITAL_BASED_OUTPATIENT_CLINIC_OR_DEPARTMENT_OTHER): Payer: Medicare Other

## 2014-08-30 ENCOUNTER — Encounter (HOSPITAL_COMMUNITY): Payer: Medicare Other | Attending: Oncology | Admitting: Oncology

## 2014-08-30 VITALS — BP 119/67 | HR 78 | Temp 98.9°F | Resp 18 | Wt 150.6 lb

## 2014-08-30 DIAGNOSIS — D696 Thrombocytopenia, unspecified: Secondary | ICD-10-CM | POA: Diagnosis not present

## 2014-08-30 DIAGNOSIS — M8508 Fibrous dysplasia (monostotic), other site: Secondary | ICD-10-CM | POA: Diagnosis not present

## 2014-08-30 DIAGNOSIS — M8569 Other cyst of bone, multiple sites: Secondary | ICD-10-CM | POA: Insufficient documentation

## 2014-08-30 DIAGNOSIS — M8500 Fibrous dysplasia (monostotic), unspecified site: Secondary | ICD-10-CM

## 2014-08-30 DIAGNOSIS — G40909 Epilepsy, unspecified, not intractable, without status epilepticus: Secondary | ICD-10-CM | POA: Diagnosis not present

## 2014-08-30 LAB — CBC WITH DIFFERENTIAL/PLATELET
Basophils Absolute: 0 10*3/uL (ref 0.0–0.1)
Basophils Relative: 0 % (ref 0–1)
EOS PCT: 0 % (ref 0–5)
Eosinophils Absolute: 0 10*3/uL (ref 0.0–0.7)
HEMATOCRIT: 38.2 % (ref 36.0–46.0)
HEMOGLOBIN: 12.9 g/dL (ref 12.0–15.0)
LYMPHS PCT: 12 % (ref 12–46)
Lymphs Abs: 1.4 10*3/uL (ref 0.7–4.0)
MCH: 31.5 pg (ref 26.0–34.0)
MCHC: 33.8 g/dL (ref 30.0–36.0)
MCV: 93.4 fL (ref 78.0–100.0)
Monocytes Absolute: 1 10*3/uL (ref 0.1–1.0)
Monocytes Relative: 9 % (ref 3–12)
NEUTROS PCT: 79 % — AB (ref 43–77)
Neutro Abs: 8.9 10*3/uL — ABNORMAL HIGH (ref 1.7–7.7)
PLATELETS: 128 10*3/uL — AB (ref 150–400)
RBC: 4.09 MIL/uL (ref 3.87–5.11)
RDW: 13.5 % (ref 11.5–15.5)
WBC: 11.3 10*3/uL — ABNORMAL HIGH (ref 4.0–10.5)

## 2014-08-30 NOTE — Assessment & Plan Note (Addendum)
Stable x many years, thus far consistent with low-grade idiopathic thrombocytopenic purpura.  Labs today as planned including a CBC diff, platelet antibodies, and anticardiolipin antibodies.  Labs in 9 months: CBC diff.  Return in 9 months for follow-up.

## 2014-08-30 NOTE — Patient Instructions (Signed)
[place] at [facility] Discharge Instructions  RECOMMENDATIONS MADE BY THE CONSULTANT AND ANY TEST RESULTS WILL BE SENT TO YOUR REFERRING PHYSICIAN.  Exam and discussion by Robynn Pane, PA-C Labs are stable.  Call with any concerns Follow-up in 9 months with labs and office visit.  Thank you for choosing [place] at [facility] to provide your oncology and hematology care.  To afford each patient quality time with our provider, please arrive at least 15 minutes before your scheduled appointment time.    You need to re-schedule your appointment should you arrive 10 or more minutes late.  We strive to give you quality time with our providers, and arriving late affects you and other patients whose appointments are after yours.  Also, if you no show three or more times for appointments you may be dismissed from the clinic at the providers discretion.     Again, thank you for choosing [facility].  Our hope is that these requests will decrease the amount of time that you wait before being seen by our physicians.       _____________________________________________________________  Should you have questions after your visit to [facility], please contact our office at [phone] between the hours of 8:30 a.m. and 4:30 p.m.  Voicemails left after 4:30 p.m. will not be returned until the following business day.  For prescription refill requests, have your pharmacy contact our office.

## 2014-08-30 NOTE — Assessment & Plan Note (Addendum)
Stable on my review.  Oncology work-up negative including  multiple myeloma work-up and occult malignancy work-up with CT CAP.   Followed by Dr. Jannifer Franklin, neurology.

## 2014-08-30 NOTE — Progress Notes (Signed)
Maria Morel, MD [place] [address]  Thrombocytopenia - Plan: CBC with Differential  Bone fibrous dysplasia of skull  CURRENT THERAPY: Observation  INTERVAL HISTORY: Maria [name] 54 y.o. female returns for followup of thrombocytopenia thus far consistent with low-grade idiopathic thrombocytopenic purpura.  I personally reviewed and went over laboratory results with the patient.  The results are noted within this dictation.  Platelet count is stable without any changes x years.    She notes a seizure yesterday.  She denies any trauma or falls associated with this particular seizure.  I have encouraged them to follow-up with Dr. Jannifer Franklin.    I personally reviewed and went over radiographic studies with the patient.  The results are noted within this dictation.  CT head from a fall associated with a seizure in November.  On my review, the right frontal cranial lesion is stable and is more clear margins.  It may have grown 1 mm is size if anything.  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  Will defer to Dr. Jannifer Franklin if further work-up is needed.  Hematologically, Maria [name] denies any complaints and ROS questioning is negative.   Past Medical History  Diagnosis Date  . Seizures   . GERD (gastroesophageal reflux disease)   . Mental retardation     lesion in head    has CLOSED FRACTURE OF ACROMIAL END OF CLAVICLE; Seizures; GERD (gastroesophageal reflux disease); Thrombocytopenia; Mental retardation; Generalized convulsive epilepsy; Encounter for therapeutic drug monitoring; Bone fibrous dysplasia of skull; Chest pain; Upper abdominal pain; and Seizure disorder on her problem list.     is allergic to codeine and lamictal.  Ms. Maria [name] had no medications administered during this visit.  Past Surgical History  Procedure Laterality Date  . Total abdominal hysterectomy    . Tonsillectomy and adenoidectomy    . Skin graft to gum Right 08/2013    . Cataract extraction      both eyes, May of 2015  . Mouth surgery      Denies any headaches, dizziness, double vision, fevers, chills, night sweats, nausea, vomiting, diarrhea, constipation, chest pain, heart palpitations, shortness of breath, blood in stool, black tarry stool, urinary pain, urinary burning, urinary frequency, hematuria.   PHYSICAL EXAMINATION  ECOG PERFORMANCE STATUS: 0 - Asymptomatic  Filed Vitals:   08/30/14 1350  BP: 119/67  Pulse: 78  Temp: 98.9 F (37.2 C)  Resp: 18    GENERAL:alert, no distress, well nourished, well developed, comfortable, cooperative and smiling, accompanied by mother. SKIN: skin color, texture, turgor are normal, no rashes or significant lesions HEAD: Normocephalic, No masses, lesions, tenderness or abnormalities EYES: normal, PERRLA, EOMI, Conjunctiva are pink and non-injected EARS: External ears normal OROPHARYNX:lips, buccal mucosa, and tongue normal and mucous membranes are moist  NECK: supple, no adenopathy, thyroid normal size, non-tender, without nodularity, no stridor, non-tender, trachea midline LYMPH:  no palpable lymphadenopathy, no hepatosplenomegaly BREAST:not examined LUNGS: clear to auscultation  HEART: regular rate & rhythm, no murmurs and no gallops ABDOMEN:abdomen soft, non-tender and normal bowel sounds BACK: Back symmetric, no curvature., No CVA tenderness EXTREMITIES:less then 2 second capillary refill, no joint deformities, effusion, or inflammation, no edema, no skin discoloration, no clubbing, no cyanosis  NEURO: alert & oriented x 3 with fluent speech, no focal motor/sensory deficits, gait normal   LABORATORY DATA: CBC    Component Value Date/Time   WBC 11.3* 08/30/2014 1322   RBC 4.09 08/30/2014 1322   HGB  12.9 08/30/2014 1322   HCT 38.2 08/30/2014 1322   PLT 128* 08/30/2014 1322   MCV 93.4 08/30/2014 1322   MCH 31.5 08/30/2014 1322   MCHC 33.8 08/30/2014 1322   RDW 13.5 08/30/2014 1322    LYMPHSABS 1.4 08/30/2014 1322   MONOABS 1.0 08/30/2014 1322   EOSABS 0.0 08/30/2014 1322   BASOSABS 0.0 08/30/2014 1322      Chemistry      Component Value Date/Time   NA 142 04/30/2014 0939   NA 141 10/07/2012 1050   K 3.7 04/30/2014 0939   CL 105 04/30/2014 0939   CO2 23 04/30/2014 0939   BUN 22 04/30/2014 0939   BUN 19 10/07/2012 1050   CREATININE 0.98 04/30/2014 0939   CREATININE 0.84 12/10/2012 1325      Component Value Date/Time   CALCIUM 9.2 04/30/2014 0939   ALKPHOS 70 04/30/2014 0939   AST 18 04/30/2014 0939   ALT 14 04/30/2014 0939   BILITOT 0.5 04/30/2014 0939        ASSESSMENT AND PLAN:  Thrombocytopenia Stable x many years, thus far consistent with low-grade idiopathic thrombocytopenic purpura.  Labs today as planned including a CBC diff, platelet antibodies, and anticardiolipin antibodies.  Labs in 9 months: CBC diff.  Return in 9 months for follow-up.   Bone fibrous dysplasia of skull Stable on my review.  Oncology work-up negative including  multiple myeloma work-up and occult malignancy work-up with CT CAP.   Followed by Dr. Jannifer Franklin, neurology.   2. History of a seizure disorder on medication.  3. History of total abdominal hysterectomy in the past.    THERAPY PLAN:  We will continue to monitor labs.  All questions were answered. The patient knows to call the clinic with any problems, questions or concerns. We can certainly see the patient much sooner if necessary.  Patient and plan discussed with Dr. Ancil Linsey and she is in agreement with the aforementioned.   This note is electronically signed by: Robynn Pane 08/30/2014 2:51 PM

## 2014-08-30 NOTE — Progress Notes (Signed)
LABS DRAWN

## 2014-08-31 LAB — CARDIOLIPIN ANTIBODIES, IGG, IGM, IGA
Anticardiolipin IgA: <9 APL U/mL (ref 0–11)
Anticardiolipin IgG: 29 GPL U/mL — ABNORMAL HIGH (ref 0–14)

## 2014-09-01 ENCOUNTER — Other Ambulatory Visit (HOSPITAL_COMMUNITY): Payer: Self-pay

## 2014-09-06 ENCOUNTER — Ambulatory Visit (INDEPENDENT_AMBULATORY_CARE_PROVIDER_SITE_OTHER): Payer: Medicare Other | Admitting: Internal Medicine

## 2014-09-06 ENCOUNTER — Encounter (INDEPENDENT_AMBULATORY_CARE_PROVIDER_SITE_OTHER): Payer: Self-pay | Admitting: Internal Medicine

## 2014-09-06 VITALS — BP 110/68 | HR 70 | Temp 98.7°F | Resp 18 | Ht 66.0 in | Wt 152.2 lb

## 2014-09-06 DIAGNOSIS — K219 Gastro-esophageal reflux disease without esophagitis: Secondary | ICD-10-CM | POA: Diagnosis not present

## 2014-09-06 DIAGNOSIS — R1310 Dysphagia, unspecified: Secondary | ICD-10-CM

## 2014-09-06 DIAGNOSIS — R1319 Other dysphagia: Secondary | ICD-10-CM

## 2014-09-06 DIAGNOSIS — R1314 Dysphagia, pharyngoesophageal phase: Secondary | ICD-10-CM | POA: Diagnosis not present

## 2014-09-06 NOTE — Progress Notes (Signed)
Presenting complaint;  Solid food dysphagia.  Subjective:  Patient is 54 year old Caucasian female who presents for scheduled visit. She was last seen 6 months ago. History is obtained from the patient with help of her mother. Patient complains of dysphagia to solids and occasionally to liquids. Her daughter states she's had swallowing difficulty since she was 54 years old. She has not had an episode of food impaction lately. Food bolus eventually passes down. She is having difficulty couple of times a month. She points to suprasternal area and lower chest sessile bolus obstruction. She says heartburn is well controlled with therapy. She uses Tums usually in the evening few times a month. She says appetite is very good. He denies melena or rectal bleeding.   Current Medications: Outpatient Encounter Prescriptions as of 09/06/2014  Medication Sig  . Calcium Carb-Cholecalciferol (CALCIUM 500 +D) 500-400 MG-UNIT TABS Take 1 tablet by mouth 2 (two) times daily.  . Calcium Carbonate Antacid (TUMS PO) Take 1 tablet by mouth as needed (heartburn).   Marland Kitchen dexlansoprazole (DEXILANT) 60 MG capsule Take one capsule by mouth 30 minutes before breakfast daily  . divalproex (DEPAKOTE ER) 500 MG 24 hr tablet Take 1 tablet (500 mg total) by mouth 2 (two) times daily.  Marland Kitchen estradiol (VIVELLE-DOT) 0.075 MG/24HR Place 1 patch onto the skin 2 (two) times a week. On Wednesday and Saturday  . ibuprofen (ADVIL,MOTRIN) 200 MG tablet Take 200 mg by mouth 2 (two) times daily as needed for moderate pain.   Marland Kitchen lacosamide (VIMPAT) 200 MG TABS tablet Take 1 tablet (200 mg total) by mouth 2 (two) times daily.  Marland Kitchen latanoprost (XALATAN) 0.005 % ophthalmic solution Place 1 drop into both eyes at bedtime.  Marland Kitchen levocetirizine (XYZAL) 5 MG tablet Take 5 mg by mouth every evening.   Marland Kitchen oxybutynin (DITROPAN-XL) 5 MG 24 hr tablet Take 5 mg by mouth daily.  . Pediatric Multiple Vit-C-FA (PEDIATRIC MULTIVITAMIN) chewable tablet Chew 1 tablet by  mouth daily.    . [DISCONTINUED] HYDROcodone-acetaminophen (NORCO/VICODIN) 5-325 MG per tablet Take 1 tablet by mouth every 4 (four) hours as needed for moderate pain or severe pain. (Patient not taking: Reported on 09/06/2014)     Objective: Blood pressure 110/68, pulse 70, temperature 98.7 F (37.1 C), temperature source Oral, resp. rate 18, height 5\' 6"  (1.676 m), weight 152 lb 3.2 oz (69.037 kg). Patient is alert and in no acute distress. Conjunctiva is pink. Sclera is nonicteric Oropharyngeal mucosa is normal. No neck masses or thyromegaly noted. Cardiac exam with regular rhythm normal S1 and S2. No murmur or gallop noted. Lungs are clear to auscultation. Abdomen is full but soft and nontender without organomegaly or masses. No LE edema or clubbing noted.    Assessment:  #1. Chronic GERD. Heartburn is well controlled with therapy #2. Solid food dysphagia. She has history of Schatzki's ring and was last dilated in 2011 #3. Weight gain. Patient and her mother are aware. Patient needs to increase physical activity and decrease intake of high calorie foods. #4. Patient is average risk for CRC. I discussed screening with patient's mother who believes patient would not be able to tolerate screening.   Plan:  EGD with ED under monitored anesthesia care.

## 2014-09-06 NOTE — Patient Instructions (Signed)
Esophagogastroduodenoscopy to be scheduled.  

## 2014-09-07 ENCOUNTER — Telehealth (HOSPITAL_COMMUNITY): Payer: Self-pay | Admitting: Emergency Medicine

## 2014-09-07 ENCOUNTER — Encounter (INDEPENDENT_AMBULATORY_CARE_PROVIDER_SITE_OTHER): Payer: Self-pay | Admitting: *Deleted

## 2014-09-07 ENCOUNTER — Other Ambulatory Visit (INDEPENDENT_AMBULATORY_CARE_PROVIDER_SITE_OTHER): Payer: Self-pay | Admitting: *Deleted

## 2014-09-07 DIAGNOSIS — R1310 Dysphagia, unspecified: Secondary | ICD-10-CM

## 2014-09-07 LAB — PLATELET AB, INDIRECT, IGG

## 2014-09-07 NOTE — Telephone Encounter (Signed)
-----   Message from Baird Cancer, PA-C sent at 09/07/2014  1:47 PM EDT ----- Results reviewed.  All are negative.

## 2014-09-07 NOTE — Telephone Encounter (Signed)
Called pt to let her know that her blood work was negative

## 2014-09-13 NOTE — Patient Instructions (Signed)
Maria [name]  09/13/2014   Your procedure is scheduled on:  09/16/2014  Report to [facility] at  66  AM.  Call this number if you have problems the morning of surgery: [phone]   Remember:   Do not eat food or drink liquids after midnight.   Take these medicines the morning of surgery with A SIP OF WATER:  Dexilant, depakote, lacosamide, ditropan   Do not wear jewelry, make-up or nail polish.  Do not wear lotions, powders, or perfumes.   Do not shave 48 hours prior to surgery. Men may shave face and neck.  Do not bring valuables to the hospital.  [facility] is not responsible for any belongings or valuables.               Contacts, dentures or bridgework may not be worn into surgery.  Leave suitcase in the car. After surgery it may be brought to your room.  For patients admitted to the hospital, discharge time is determined by your treatment team.               Patients discharged the day of surgery will not be allowed to drive home.  Name and phone number of your driver: family  Special Instructions: N/A   Please read over the following fact sheets that you were given: Pain Booklet, Coughing and Deep Breathing, Surgical Site Infection Prevention, Anesthesia Post-op Instructions and Care and Recovery After Surgery Esophagogastroduodenoscopy Esophagogastroduodenoscopy (EGD) is a procedure to examine the lining of the esophagus, stomach, and first part of the small intestine (duodenum). A long, flexible, lighted tube with a camera attached (endoscope) is inserted down the throat to view these organs. This procedure is done to detect problems or abnormalities, such as inflammation, bleeding, ulcers, or growths, in order to treat them. The procedure lasts about 5-20 minutes. It is usually an outpatient procedure, but it may need to be performed in emergency cases in the hospital. LET YOUR CAREGIVER KNOW ABOUT:   Allergies to food or medicine.  All medicines you are  taking, including vitamins, herbs, eyedrops, and over-the-counter medicines and creams.  Use of steroids (by mouth or creams).  Previous problems you or members of your family have had with the use of anesthetics.  Any blood disorders you have.  Previous surgeries you have had.  Other health problems you have.  Possibility of pregnancy, if this applies. RISKS AND COMPLICATIONS  Generally, EGD is a safe procedure. However, as with any procedure, complications can occur. Possible complications include:  Infection.  Bleeding.  Tearing (perforation) of the esophagus, stomach, or duodenum.  Difficulty breathing or not being able to breath.  Excessive sweating.  Spasms of the larynx.  Slowed heartbeat.  Low blood pressure. BEFORE THE PROCEDURE  Do not eat or drink anything for 6-8 hours before the procedure or as directed by your caregiver.  Ask your caregiver about changing or stopping your regular medicines.  If you wear dentures, be prepared to remove them before the procedure.  Arrange for someone to drive you home after the procedure. PROCEDURE   A vein will be accessed to give medicines and fluids. A medicine to relax you (sedative) and a pain reliever will be given through that access into the vein.  A numbing medicine (local anesthetic) may be sprayed on your throat for comfort and to stop you from gagging or coughing.  A mouth guard may be placed in your mouth to protect  your teeth and to keep you from biting on the endoscope.  You will be asked to lie on your left side.  The endoscope is inserted down your throat and into the esophagus, stomach, and duodenum.  Air is put through the endoscope to allow your caregiver to view the lining of your esophagus clearly.  The esophagus, stomach, and duodenum is then examined. During the exam, your caregiver may:  Remove tissue to be examined under a microscope (biopsy) for inflammation, infection, or other medical  problems.  Remove growths.  Remove objects (foreign bodies) that are stuck.  Treat any bleeding with medicines or other devices that stop tissues from bleeding (hot cautery, clipping devices).  Widen (dilate) or stretch narrowed areas of the esophagus and stomach.  The endoscope will then be withdrawn. AFTER THE PROCEDURE  You will be taken to a recovery area to be monitored. You will be able to go home once you are stable and alert.  Do not eat or drink anything until the local anesthetic and numbing medicines have worn off. You may choke.  It is normal to feel bloated, have pain with swallowing, or have a sore throat for a short time. This will wear off.  Your caregiver should be able to discuss his or her findings with you. It will take longer to discuss the test results if any biopsies were taken. Document Released: 09/20/2004 Document Revised: 10/04/2013 Document Reviewed: 04/22/2012 [facility] Patient Information 2015 [place], [place]. This information is not intended to replace advice given to you by your health care provider. Make sure you discuss any questions you have with your health care provider. Esophageal Dilatation The esophagus is the long, narrow tube which carries food and liquid from the mouth to the stomach. Esophageal dilatation is the technique used to stretch a blocked or narrowed portion of the esophagus. This procedure is used when a part of the esophagus has become so narrow that it becomes difficult, painful or even impossible to swallow. This is generally an uncomplicated form of treatment. When this is not successful, chest surgery may be required. This is a much more extensive form of treatment with a longer recovery time. CAUSES  Some of the more common causes of blockage or strictures of the esophagus are:  Narrowing from longstanding inflammation (soreness and redness) of the lower esophagus. This comes from the constant exposure of the lower esophagus to the  acid which bubbles up from the stomach. Over time this causes scarring and narrowing of the lower esophagus.  Hiatal hernia in which a small part of the stomach bulges (herniates) up through the diaphragm. This can cause a gradual narrowing of the end of the esophagus.  Schatzki ring is a narrow ring of benign (non-cancerous) fibrous tissue which constricts the lower esophagus. The reason for this is not known.  Scleroderma is a connective tissue disorder that affects the esophagus and makes swallowing difficult.  Achalasia is an absence of nerves to the lower esophagus and to the esophageal sphincter. This is the circular muscle between the stomach and esophagus that relaxes to allow food into the stomach. After swallowing, it contracts to keep food in the stomach. This absence of nerves may be congenital (present since birth). This can cause irregular spasms of the lower esophageal muscle. This spasm does not open up to allow food and fluid through. The result is a persistent blockage with subsequent slow trickling of the esophageal contents into the stomach.  Strictures may develop from swallowing materials  which damage the esophagus. Some examples are strong acids or alkalis such as lye.  Growths such as benign (non-cancerous) and malignant (cancerous) tumors can block the esophagus.  Hereditary (present since birth) causes. DIAGNOSIS  Your caregiver often suspects this problem by taking a medical history. They will also do a physical exam. They can then prove their suspicions using X-rays and endoscopy. Endoscopy is an exam in which a tube like a small, flexible telescope is used to look at your esophagus.  TREATMENT There are different stretching (dilating) techniques that can be used. Simple bougie dilatation may be done in the office. This usually takes only a couple minutes. A numbing (anesthetic) spray of the throat is used. Endoscopy, when done, is done in an endoscopy suite under mild  sedation. When fluoroscopy is used, the procedure is performed in X-ray. Other techniques require a little longer time. Recovery is usually quick. There is no waiting time to begin eating and drinking to test success of the treatment. Following are some of the methods used. Narrowing of the esophagus is treated by making it bigger. Commonly this is a mechanical problem which can be treated with stretching. This can be done in different ways. Your caregiver will discuss these with you. Some of the means used are:  A series of graduated (increasing thickness) flexible dilators can be used. These are weighted tubes passed through the esophagus into the stomach. The tubes used become progressively larger until the desired stretched size is reached. Graduated dilators are a simple and quick way of opening the esophagus. No visualization is required.  Another method is the use of endoscopy to place a flexible wire across the stricture. The endoscope is removed and the wire left in place. A dilator with a hole through it from end to end is guided down the esophagus and across the stricture. One or more of these dilators are passed over the wire. At the end of the exam, the wire is removed. This type of treatment may be performed in the X-ray department under fluoroscopy. An advantage of this procedure is the examiner is visualizing the end opening in the esophagus.  Stretching of the esophagus may be done using balloons. Deflated balloons are placed through the endoscope and across the stricture. This type of balloon dilatation is often done at the time of endoscopy or fluoroscopy. Flexible endoscopy allows the examiner to directly view the stricture. A balloon is inserted in the deflated form into the area of narrowing. It is then inflated with air to a certain pressure that is preset for a given circumference. When inflated, it becomes sausage shaped, stretched, and makes the stricture larger.  Achalasia  requires a longer, larger balloon-type dilator. This is frequently done under X-ray control. In this situation, the spastic muscle fibers in the lower esophagus are stretched. All of the above procedures make the passage of food and water into the stomach easier. They also make it easier for stomach contents to reflux back into the esophagus. Special medications may be used following the procedure to help prevent further stricturing. Proton-pump inhibitor medications are good at decreasing the amount of acid in the stomach juice. When stomach juice refluxes into the esophagus, the juice is no longer as acidic and is less likely to burn or scar the esophagus. RISKS AND COMPLICATIONS Esophageal dilatation is usually performed effectively and without problems. Some complications that can occur are:  A small amount of bleeding almost always happens where the stretching takes place. If  this is too excessive it may require more aggressive treatment.  An uncommon complication is perforation (making a hole) of the esophagus. The esophagus is thin. It is easy to make a hole in it. If this happens, an operation may be necessary to repair this.  A small, undetected perforation could lead to an infection in the chest. This can be very serious. HOME CARE INSTRUCTIONS   If you received sedation for your procedure, do not drive, make important decisions, or perform any activities requiring your full coordination. Do not drink alcohol, take sedatives, or use any mind altering chemicals unless instructed by your caregiver.  You may use throat lozenges or warm salt water gargles if you have throat discomfort.  You can begin eating and drinking normally on return home unless instructed otherwise. Do not purposely try to force large chunks of food down to test the benefits of your procedure.  Mild discomfort can be eased with sips of ice water.  Medications for discomfort may or may not be needed. SEEK IMMEDIATE  MEDICAL CARE IF:   You begin vomiting up blood.  You develop black, tarry stools.  You develop chills or an unexplained temperature of over 101F (38.3C)  You develop chest or abdominal pain.  You develop shortness of breath, or feel light-headed or faint.  Your swallowing is becoming more painful, difficult, or you are unable to swallow. MAKE SURE YOU:   Understand these instructions.  Will watch your condition.  Will get help right away if you are not doing well or get worse. Document Released: 07/11/2005 Document Revised: 10/04/2013 Document Reviewed: 08/28/2005 [facility] Patient Information 2015 [place], [place]. This information is not intended to replace advice given to you by your health care provider. Make sure you discuss any questions you have with your health care provider. PATIENT INSTRUCTIONS POST-ANESTHESIA  IMMEDIATELY FOLLOWING SURGERY:  Do not drive or operate machinery for the first twenty four hours after surgery.  Do not make any important decisions for twenty four hours after surgery or while taking narcotic pain medications or sedatives.  If you develop intractable nausea and vomiting or a severe headache please notify your doctor immediately.  FOLLOW-UP:  Please make an appointment with your surgeon as instructed. You do not need to follow up with anesthesia unless specifically instructed to do so.  WOUND CARE INSTRUCTIONS (if applicable):  Keep a dry clean dressing on the anesthesia/puncture wound site if there is drainage.  Once the wound has quit draining you may leave it open to air.  Generally you should leave the bandage intact for twenty four hours unless there is drainage.  If the epidural site drains for more than 36-48 hours please call the anesthesia department.  QUESTIONS?:  Please feel free to call your physician or the hospital operator if you have any questions, and they will be happy to assist you.

## 2014-09-14 ENCOUNTER — Encounter (HOSPITAL_COMMUNITY)
Admission: RE | Admit: 2014-09-14 | Discharge: 2014-09-14 | Disposition: A | Discharge status: Home / Self Care | Payer: Medicare Other | Source: Ambulatory Visit | Attending: Internal Medicine | Admitting: Internal Medicine

## 2014-09-14 ENCOUNTER — Encounter (HOSPITAL_COMMUNITY): Payer: Self-pay

## 2014-09-14 ENCOUNTER — Other Ambulatory Visit: Payer: Self-pay

## 2014-09-14 VITALS — Ht 66.0 in

## 2014-09-14 DIAGNOSIS — M79671 Pain in right foot: Secondary | ICD-10-CM | POA: Diagnosis not present

## 2014-09-14 DIAGNOSIS — Z79899 Other long term (current) drug therapy: Secondary | ICD-10-CM | POA: Diagnosis not present

## 2014-09-14 DIAGNOSIS — Z9889 Other specified postprocedural states: Secondary | ICD-10-CM | POA: Diagnosis not present

## 2014-09-14 DIAGNOSIS — R1310 Dysphagia, unspecified: Secondary | ICD-10-CM

## 2014-09-14 DIAGNOSIS — Z791 Long term (current) use of non-steroidal anti-inflammatories (NSAID): Secondary | ICD-10-CM | POA: Diagnosis not present

## 2014-09-14 DIAGNOSIS — K222 Esophageal obstruction: Secondary | ICD-10-CM | POA: Diagnosis not present

## 2014-09-14 DIAGNOSIS — G40909 Epilepsy, unspecified, not intractable, without status epilepticus: Secondary | ICD-10-CM | POA: Diagnosis not present

## 2014-09-14 DIAGNOSIS — K449 Diaphragmatic hernia without obstruction or gangrene: Secondary | ICD-10-CM | POA: Diagnosis not present

## 2014-09-14 DIAGNOSIS — K259 Gastric ulcer, unspecified as acute or chronic, without hemorrhage or perforation: Secondary | ICD-10-CM | POA: Diagnosis not present

## 2014-09-14 DIAGNOSIS — Z87891 Personal history of nicotine dependence: Secondary | ICD-10-CM | POA: Diagnosis not present

## 2014-09-14 DIAGNOSIS — K219 Gastro-esophageal reflux disease without esophagitis: Secondary | ICD-10-CM | POA: Diagnosis not present

## 2014-09-14 DIAGNOSIS — F79 Unspecified intellectual disabilities: Secondary | ICD-10-CM | POA: Diagnosis not present

## 2014-09-14 DIAGNOSIS — K317 Polyp of stomach and duodenum: Secondary | ICD-10-CM | POA: Diagnosis not present

## 2014-09-14 LAB — BASIC METABOLIC PANEL WITH GFR
Anion gap: 9 (ref 5–15)
BUN: 20 mg/dL (ref 6–23)
CO2: 28 mmol/L (ref 19–32)
Calcium: 9.7 mg/dL (ref 8.4–10.5)
Chloride: 102 mmol/L (ref 96–112)
Creatinine, Ser: 0.81 mg/dL (ref 0.50–1.10)
GFR calc Af Amer: >90 mL/min
GFR calc non Af Amer: 82 mL/min — ABNORMAL LOW
Glucose, Bld: 81 mg/dL (ref 70–99)
Potassium: 4.6 mmol/L (ref 3.5–5.1)
Sodium: 139 mmol/L (ref 135–145)

## 2014-09-14 LAB — CBC
HCT: 36.7 % (ref 36.0–46.0)
Hemoglobin: 12.1 g/dL (ref 12.0–15.0)
MCH: 31.2 pg (ref 26.0–34.0)
MCHC: 33 g/dL (ref 30.0–36.0)
MCV: 94.6 fL (ref 78.0–100.0)
Platelets: 121 K/uL — ABNORMAL LOW (ref 150–400)
RBC: 3.88 MIL/uL (ref 3.87–5.11)
RDW: 13.7 % (ref 11.5–15.5)
WBC: 9.2 K/uL (ref 4.0–10.5)

## 2014-09-16 ENCOUNTER — Ambulatory Visit (HOSPITAL_COMMUNITY): Payer: Medicare Other | Admitting: Anesthesiology

## 2014-09-16 ENCOUNTER — Ambulatory Visit (HOSPITAL_COMMUNITY)
Admission: RE | Admit: 2014-09-16 | Discharge: 2014-09-16 | Disposition: A | Discharge status: Home / Self Care | Payer: Medicare Other | Source: Ambulatory Visit | Attending: Internal Medicine | Admitting: Internal Medicine

## 2014-09-16 ENCOUNTER — Encounter (HOSPITAL_COMMUNITY): Payer: Self-pay | Admitting: *Deleted

## 2014-09-16 ENCOUNTER — Encounter (HOSPITAL_COMMUNITY)
Admission: RE | Disposition: A | Discharge status: Home / Self Care | Payer: Self-pay | Source: Ambulatory Visit | Attending: Internal Medicine

## 2014-09-16 DIAGNOSIS — R1310 Dysphagia, unspecified: Secondary | ICD-10-CM

## 2014-09-16 DIAGNOSIS — Z79899 Other long term (current) drug therapy: Secondary | ICD-10-CM | POA: Insufficient documentation

## 2014-09-16 DIAGNOSIS — G40909 Epilepsy, unspecified, not intractable, without status epilepticus: Secondary | ICD-10-CM | POA: Insufficient documentation

## 2014-09-16 DIAGNOSIS — M79671 Pain in right foot: Secondary | ICD-10-CM | POA: Diagnosis not present

## 2014-09-16 DIAGNOSIS — K259 Gastric ulcer, unspecified as acute or chronic, without hemorrhage or perforation: Secondary | ICD-10-CM | POA: Insufficient documentation

## 2014-09-16 DIAGNOSIS — K3189 Other diseases of stomach and duodenum: Secondary | ICD-10-CM | POA: Diagnosis not present

## 2014-09-16 DIAGNOSIS — K317 Polyp of stomach and duodenum: Secondary | ICD-10-CM | POA: Insufficient documentation

## 2014-09-16 DIAGNOSIS — K222 Esophageal obstruction: Secondary | ICD-10-CM | POA: Insufficient documentation

## 2014-09-16 DIAGNOSIS — Z791 Long term (current) use of non-steroidal anti-inflammatories (NSAID): Secondary | ICD-10-CM | POA: Insufficient documentation

## 2014-09-16 DIAGNOSIS — K219 Gastro-esophageal reflux disease without esophagitis: Secondary | ICD-10-CM

## 2014-09-16 DIAGNOSIS — K449 Diaphragmatic hernia without obstruction or gangrene: Secondary | ICD-10-CM | POA: Insufficient documentation

## 2014-09-16 DIAGNOSIS — Z9889 Other specified postprocedural states: Secondary | ICD-10-CM | POA: Insufficient documentation

## 2014-09-16 DIAGNOSIS — K289 Gastrojejunal ulcer, unspecified as acute or chronic, without hemorrhage or perforation: Secondary | ICD-10-CM | POA: Diagnosis not present

## 2014-09-16 DIAGNOSIS — F79 Unspecified intellectual disabilities: Secondary | ICD-10-CM | POA: Insufficient documentation

## 2014-09-16 DIAGNOSIS — W19XXXA Unspecified fall, initial encounter: Secondary | ICD-10-CM

## 2014-09-16 DIAGNOSIS — Z87891 Personal history of nicotine dependence: Secondary | ICD-10-CM | POA: Insufficient documentation

## 2014-09-16 DIAGNOSIS — S9031XA Contusion of right foot, initial encounter: Secondary | ICD-10-CM | POA: Diagnosis not present

## 2014-09-16 HISTORY — PX: ESOPHAGOGASTRODUODENOSCOPY (EGD) WITH PROPOFOL: SHX5813

## 2014-09-16 HISTORY — PX: ESOPHAGEAL DILATION: SHX303

## 2014-09-16 HISTORY — PX: BIOPSY: SHX5522

## 2014-09-16 SURGERY — ESOPHAGOGASTRODUODENOSCOPY (EGD) WITH PROPOFOL
Anesthesia: Monitor Anesthesia Care

## 2014-09-16 MED ORDER — PROPOFOL 10 MG/ML IV BOLUS
INTRAVENOUS | Status: AC
  Filled 2014-09-16: qty 20

## 2014-09-16 MED ORDER — MIDAZOLAM HCL 2 MG/2ML IJ SOLN
INTRAMUSCULAR | Status: AC
  Filled 2014-09-16: qty 2

## 2014-09-16 MED ORDER — MEPERIDINE HCL 50 MG/ML IJ SOLN: 6.2500 mg | Freq: Once | INTRAMUSCULAR | Status: DC

## 2014-09-16 MED ORDER — FENTANYL CITRATE (PF) 100 MCG/2ML IJ SOLN
25.0000 ug | INTRAMUSCULAR | Status: AC
  Administered 2014-09-16: 25 ug via INTRAVENOUS

## 2014-09-16 MED ORDER — ONDANSETRON HCL 4 MG/2ML IJ SOLN: 4.0000 mg | Freq: Once | INTRAMUSCULAR | Status: DC | PRN

## 2014-09-16 MED ORDER — PROPOFOL 10 MG/ML IV BOLUS
INTRAVENOUS | Status: DC | PRN
  Administered 2014-09-16: 7 mg via INTRAVENOUS
  Administered 2014-09-16: 10 mg via INTRAVENOUS
  Administered 2014-09-16: 7 mg via INTRAVENOUS
  Administered 2014-09-16: 10 mg via INTRAVENOUS

## 2014-09-16 MED ORDER — STERILE WATER FOR IRRIGATION IR SOLN
Status: DC | PRN
  Administered 2014-09-16: 1000 mL

## 2014-09-16 MED ORDER — ONDANSETRON HCL 4 MG/2ML IJ SOLN
INTRAMUSCULAR | Status: AC
  Filled 2014-09-16: qty 2

## 2014-09-16 MED ORDER — LIDOCAINE VISCOUS 2 % MT SOLN
OROMUCOSAL | Status: AC
  Filled 2014-09-16: qty 15

## 2014-09-16 MED ORDER — ONDANSETRON HCL 4 MG/2ML IJ SOLN
4.0000 mg | Freq: Once | INTRAMUSCULAR | Status: AC
  Administered 2014-09-16: 4 mg via INTRAVENOUS

## 2014-09-16 MED ORDER — MIDAZOLAM HCL 2 MG/2ML IJ SOLN
1.0000 mg | INTRAMUSCULAR | Status: DC | PRN
Start: 2014-09-16 — End: 2014-09-16
  Administered 2014-09-16: 2 mg via INTRAVENOUS

## 2014-09-16 MED ORDER — PROPOFOL INFUSION 10 MG/ML OPTIME
INTRAVENOUS | Status: DC | PRN
  Administered 2014-09-16: 125 ug/kg/min via INTRAVENOUS

## 2014-09-16 MED ORDER — FENTANYL CITRATE (PF) 100 MCG/2ML IJ SOLN
INTRAMUSCULAR | Status: AC
  Filled 2014-09-16: qty 2

## 2014-09-16 MED ORDER — FENTANYL CITRATE (PF) 100 MCG/2ML IJ SOLN: 25.0000 ug | INTRAMUSCULAR | Status: DC | PRN

## 2014-09-16 MED ORDER — LACTATED RINGERS IV SOLN
INTRAVENOUS | Status: DC
  Administered 2014-09-16: 13:00:00 via INTRAVENOUS

## 2014-09-16 SURGICAL SUPPLY — 9 items
BLOCK BITE 60FR ADLT L/F BLUE (MISCELLANEOUS) ×2 IMPLANT
FLOOR PAD 36X40 (MISCELLANEOUS) ×4
FORCEPS BIOP RAD 4 LRG CAP 4 (CUTTING FORCEPS) ×2 IMPLANT
FORMALIN 10 PREFIL 20ML (MISCELLANEOUS) ×2 IMPLANT
KIT CLEAN ENDO COMPLIANCE (KITS) ×2 IMPLANT
MANIFOLD NEPTUNE II (INSTRUMENTS) ×4 IMPLANT
PAD FLOOR 36X40 (MISCELLANEOUS) IMPLANT
TUBING IRRIGATION ENDOGATOR (MISCELLANEOUS) ×2 IMPLANT
WATER STERILE IRR 1000ML POUR (IV SOLUTION) ×2 IMPLANT

## 2014-09-16 NOTE — Progress Notes (Signed)
Swallowing without difficulty. 

## 2014-09-16 NOTE — Transfer of Care (Signed)
Immediate Anesthesia Transfer of Care Note  Patient: Maria [name]  Procedure(s) Performed: Procedure(s) (LRB): ESOPHAGOGASTRODUODENOSCOPY (EGD) WITH PROPOFOL (N/A) ESOPHAGEAL DILATION WITH 54FR MALONEY DILATOR (N/A) BIOPSY  Patient Location: PACU  Anesthesia Type: MAC  Level of Consciousness: awake  Airway & Oxygen Therapy: Patient Spontanous Breathing. Non Rebreather  Post-op Assessment: Report given to PACU RN, Post -op Vital signs reviewed and stable and Patient moving all extremities  Post vital signs: Reviewed and stable  Complications: No apparent anesthesia complications

## 2014-09-16 NOTE — H&P (Signed)
Maria [name] is an 54 y.o. female.   Chief Complaint: Patient is here for EGD and ED. HPI: Patient is 54 year old Caucasian female with history of seizure disorder and mental retardation who also has chronic GERD and presents with few months history of dysphagia primarily to solids but occasionally to liquids. She has history of Schatzki's ring and has undergone 3 dilation the past. Last dilation was in 2011. Patient states heartburns well controlled with PPI. She denies melena or rectal bleeding. Patient complains of pain in right foot. She fell couple days of grocery store.  Past Medical History  Diagnosis Date  . Seizures   . GERD (gastroesophageal reflux disease)   . Mental retardation     lesion in head    Past Surgical History  Procedure Laterality Date  . Total abdominal hysterectomy    . Tonsillectomy and adenoidectomy    . Skin graft to gum Right 08/2013  . Cataract extraction      both eyes, May of 2015  . Mouth surgery      Family History  Problem Relation Age of Onset  . High Cholesterol Mother   . High blood pressure Mother   . Diabetes Father    Social History:  reports that she quit smoking about 8 years ago. Her smoking use included Cigarettes. She has a 22.5 pack-year smoking history. She has never used smokeless tobacco. She reports that she does not drink alcohol or use illicit drugs.  Allergies:  Allergies  Allergen Reactions  . Codeine     REACTION: nausea, vomiting  . Lamictal [Lamotrigine]     Skin rash    Medications Prior to Admission  Medication Sig Dispense Refill  . Calcium Carb-Cholecalciferol (CALCIUM 500 +D) 500-400 MG-UNIT TABS Take 1 tablet by mouth 2 (two) times daily.    . Calcium Carbonate Antacid (TUMS PO) Take 1 tablet by mouth as needed (heartburn).     Marland Kitchen dexlansoprazole (DEXILANT) 60 MG capsule Take one capsule by mouth 30 minutes before breakfast daily 90 capsule 3  . divalproex (DEPAKOTE ER) 500 MG 24 hr tablet Take 1 tablet  (500 mg total) by mouth 2 (two) times daily. 180 tablet 3  . estradiol (VIVELLE-DOT) 0.075 MG/24HR Place 1 patch onto the skin 2 (two) times a week. On Wednesday and Saturday    . ibuprofen (ADVIL,MOTRIN) 200 MG tablet Take 200 mg by mouth 2 (two) times daily as needed for moderate pain.     Marland Kitchen lacosamide (VIMPAT) 200 MG TABS tablet Take 1 tablet (200 mg total) by mouth 2 (two) times daily. 180 tablet 1  . latanoprost (XALATAN) 0.005 % ophthalmic solution Place 1 drop into both eyes at bedtime.    Marland Kitchen levocetirizine (XYZAL) 5 MG tablet Take 5 mg by mouth every evening.     Marland Kitchen oxybutynin (DITROPAN-XL) 5 MG 24 hr tablet Take 5 mg by mouth daily.    . Pediatric Multiple Vit-C-FA (PEDIATRIC MULTIVITAMIN) chewable tablet Chew 1 tablet by mouth daily.        Results for orders placed or performed during the hospital encounter of 09/14/14 (from the past 48 hour(s))  CBC     Status: Abnormal   Collection Time: 09/14/14  1:45 PM  Result Value Ref Range   WBC 9.2 4.0 - 10.5 K/uL   RBC 3.88 3.87 - 5.11 MIL/uL   Hemoglobin 12.1 12.0 - 15.0 g/dL   HCT 36.7 36.0 - 46.0 %   MCV 94.6 78.0 - 100.0 fL  MCH 31.2 26.0 - 34.0 pg   MCHC 33.0 30.0 - 36.0 g/dL   RDW 13.7 11.5 - 15.5 %   Platelets 121 (L) 150 - 400 K/uL  Basic metabolic panel     Status: Abnormal   Collection Time: 09/14/14  1:45 PM  Result Value Ref Range   Sodium 139 135 - 145 mmol/L   Potassium 4.6 3.5 - 5.1 mmol/L   Chloride 102 96 - 112 mmol/L   CO2 28 19 - 32 mmol/L   Glucose, Bld 81 70 - 99 mg/dL   BUN 20 6 - 23 mg/dL   Creatinine, Ser 0.81 0.50 - 1.10 mg/dL   Calcium 9.7 8.4 - 10.5 mg/dL   GFR calc non Af Amer 82 (L) >90 mL/min   GFR calc Af Amer >90 >90 mL/min    Comment: (NOTE) The eGFR has been calculated using the CKD EPI equation. This calculation has not been validated in all clinical situations. eGFR's persistently <90 mL/min signify possible Chronic Kidney Disease.    Anion gap 9 5 - 15   No results  found.  ROS  Blood pressure 144/65, temperature 97.8 F (36.6 C), temperature source Oral, resp. rate 15, SpO2 92 %. Physical Exam  Constitutional: She appears well-developed and well-nourished.  HENT:  Mouth/Throat: Oropharynx is clear and moist.  Eyes: Conjunctivae are normal. No scleral icterus.  Neck: No thyromegaly present.  Cardiovascular: Normal rate, regular rhythm and normal heart sounds.   No murmur heard. Respiratory: Effort normal and breath sounds normal.  GI: Soft. She exhibits no distension and no mass. There is tenderness (mild tenderness at right low quadrant.). There is no rebound.  Musculoskeletal: She exhibits no edema.  Ecchymosis noted over distal aspect of right foot skin is intact..  Lymphadenopathy:    She has no cervical adenopathy.  Neurological: She is alert.  Skin: Skin is warm and dry.     Assessment/Plan Dysphagia. Chronic GERD. EGD with ED.  Maria [name] U 09/16/2014, 12:43 PM

## 2014-09-16 NOTE — Addendum Note (Signed)
Addendum  created 09/16/14 1341 by Vista Deck, CRNA   Modules edited: Charges VN

## 2014-09-16 NOTE — Anesthesia Preprocedure Evaluation (Signed)
Anesthesia Evaluation  Patient identified by MRN, date of birth, ID band Patient awake    Reviewed: Allergy & Precautions, NPO status , Patient's Chart, lab work & pertinent test results  Airway Mallampati: II  TM Distance: >3 FB     Dental  (+) Teeth Intact   Pulmonary former smoker,  breath sounds clear to auscultation        Cardiovascular negative cardio ROS  Rhythm:Regular Rate:Normal     Neuro/Psych Seizures -, Well Controlled,  PSYCHIATRIC DISORDERS (hx mental retardation)    GI/Hepatic GERD-  Medicated,  Endo/Other    Renal/GU      Musculoskeletal   Abdominal   Peds  Hematology   Anesthesia Other Findings   Reproductive/Obstetrics                             Anesthesia Physical Anesthesia Plan  ASA: II  Anesthesia Plan: MAC   Post-op Pain Management:    Induction: Intravenous  Airway Management Planned: Simple Face Mask  Additional Equipment:   Intra-op Plan:   Post-operative Plan:   Informed Consent: I have reviewed the patients History and Physical, chart, labs and discussed the procedure including the risks, benefits and alternatives for the proposed anesthesia with the patient or authorized representative who has indicated his/her understanding and acceptance.     Plan Discussed with:   Anesthesia Plan Comments:         Anesthesia Quick Evaluation

## 2014-09-16 NOTE — Progress Notes (Signed)
Dr. Laural Golden states Right foot xray no fractures per report. Patient and mother notified of results.

## 2014-09-16 NOTE — Op Note (Addendum)
EGD PROCEDURE REPORT  PATIENT:  Maria [name]  MR#:  [id number] Birthdate:  1961-05-07, 54 y.o., female Endoscopist:  Dr. Rogene Houston, MD Procedure Date: 09/16/2014  Procedure:   EGD with ED  Indications:  Patient is 54 year old Caucasian female was chronic GERD and presents with dysphagia predominantly solids. She has history of Schatzki's ring and esophagus has been dilated on 3 occasions and more recently in 2011. Patient is also complaining of right foot pain since she have fall few days ago.            Informed Consent:  The risks, benefits, alternatives & imponderables which include, but are not limited to, bleeding, infection, perforation, drug reaction and potential missed lesion have been reviewed.  The potential for biopsy, lesion removal, esophageal dilation, etc. have also been discussed.  Questions have been answered.  All parties agreeable.  Please see history & physical in medical record for more information.  Medications:  Xylocaine Jelly for oropharyngeal anesthesia  Description of procedure:  The endoscope was introduced through the mouth and advanced to the second portion of the duodenum without difficulty or limitations. The mucosal surfaces were surveyed very carefully during advancement of the scope and upon withdrawal.  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 Incomplete noncritical shots gazing noted. GEJ:  37 cm Hiatus:  39 cm Stomach:  Stomach was empty and distended very well with insufflation. Folds in the proximal stomach were normal. Examination of mucosa revealed few hyperplastic appearing polyps at gastric body. Was patchy antral erythema edema with a linear erosion and a 3 mm ulcer at antrum. Angularis fundus and cardia were examined by retroflexion of scope and were normal. Duodenum:  Normal bulbar and post bulbar mucosa.  Therapeutic/Diagnostic  Maneuvers Performed:   Esophagus dilated by passing 32 French Maloney dilator to full insertion. In the scope was passed again and esophageal mucosa reexamined. Linear mucosal disruption noted at proximal esophagus felt to be due to disrupted esophageal web. Small linear dissection also noted at GE junction. Biopsy taken from the small ulcer erosion for routine histology.  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 Noncritical Schatzki's ring and small sliding hiatal hernia. Small hyperplastic appearing polyps at gastric body. These were left alone. Erosive gastritis with small gastric ulcer at antrum. Esophagus dilated by passing 65 French Maloney dilator resulting in linear disruption at GE junction and also it proximal esophagus indicative of disrupted web.   Recommendations:  Patient advised to keep ibuprofen use to minimum. She should take Tylenol 5 and milligrams by mouth 3 times a day for foot pain. Will obtain x-rays of right foot to make sure she does not have an ulcer. I will be contacting patient's mother with biopsy results.  Maria [name] U  09/16/2014  1:20 PM  CC: Dr. Ethlyn Gallery, Kassie Mends, MD & Dr. No ref. provider found

## 2014-09-16 NOTE — Discharge Instructions (Signed)
Resume usual medications. Take ibuprofen only if pain not controlled with Tylenol. Can take Tylenol 500 mg up to 3 times a day as needed for foot pain. Resume usual diet. Physician will call with biopsy results.   Esophagogastroduodenoscopy Care After Refer to this sheet in the next few weeks. These instructions provide you with information on caring for yourself after your procedure. Your caregiver may also give you more specific instructions. Your treatment has been planned according to current medical practices, but problems sometimes occur. Call your caregiver if you have any problems or questions after your procedure.  HOME CARE INSTRUCTIONS  Do not eat or drink anything until the numbing medicine (local anesthetic) has worn off and your gag reflex has returned. You will know that the local anesthetic has worn off when you can swallow comfortably.  Do not drive for 12 hours after the procedure or as directed by your caregiver.  Only take medicines as directed by your caregiver. SEEK MEDICAL CARE IF:   You cannot stop coughing.  You are not urinating at all or less than usual. SEEK IMMEDIATE MEDICAL CARE IF:  You have difficulty swallowing.  You cannot eat or drink.  You have worsening throat or chest pain.  You have dizziness, lightheadedness, or you faint.  You have nausea or vomiting.  You have chills.  You have a fever.  You have severe abdominal pain.  You have black, tarry, or bloody stools. Document Released: 05/06/2012 Document Reviewed: 05/06/2012 [facility] Patient Information 2015 [place]. This information is not intended to replace advice given to you by your health care provider. Make sure you discuss any questions you have with your health care provider.

## 2014-09-16 NOTE — Anesthesia Postprocedure Evaluation (Signed)
  Anesthesia Post-op Note  Patient: Maria [name]  Procedure(s) Performed: Procedure(s): ESOPHAGOGASTRODUODENOSCOPY (EGD) WITH PROPOFOL (N/A) ESOPHAGEAL DILATION WITH 54FR MALONEY DILATOR (N/A) BIOPSY  Patient Location: PACU  Anesthesia Type:MAC  Level of Consciousness: awake and alert   Airway and Oxygen Therapy: Patient Spontanous Breathing  Post-op Pain: none  Post-op Assessment: Post-op Vital signs reviewed, Patient's Cardiovascular Status Stable, Respiratory Function Stable and Patent Airway  Post-op Vital Signs: Reviewed and stable   Complications: No apparent anesthesia complications

## 2014-09-19 ENCOUNTER — Encounter (HOSPITAL_COMMUNITY): Payer: Self-pay | Admitting: Internal Medicine

## 2014-09-27 ENCOUNTER — Encounter (INDEPENDENT_AMBULATORY_CARE_PROVIDER_SITE_OTHER): Payer: Self-pay | Admitting: *Deleted

## 2014-10-17 DIAGNOSIS — Z124 Encounter for screening for malignant neoplasm of cervix: Secondary | ICD-10-CM | POA: Diagnosis not present

## 2014-10-17 DIAGNOSIS — Z01419 Encounter for gynecological examination (general) (routine) without abnormal findings: Secondary | ICD-10-CM | POA: Diagnosis not present

## 2014-10-17 DIAGNOSIS — Z6824 Body mass index (BMI) 24.0-24.9, adult: Secondary | ICD-10-CM | POA: Diagnosis not present

## 2014-10-26 ENCOUNTER — Emergency Department (HOSPITAL_COMMUNITY): Payer: Medicare Other

## 2014-10-26 ENCOUNTER — Emergency Department (HOSPITAL_COMMUNITY)
Admission: EM | Admit: 2014-10-26 | Discharge: 2014-10-27 | Disposition: A | Discharge status: Home / Self Care | Payer: Medicare Other | Attending: Emergency Medicine | Admitting: Emergency Medicine

## 2014-10-26 ENCOUNTER — Encounter (HOSPITAL_COMMUNITY): Payer: Self-pay | Admitting: Emergency Medicine

## 2014-10-26 DIAGNOSIS — J984 Other disorders of lung: Secondary | ICD-10-CM | POA: Diagnosis not present

## 2014-10-26 DIAGNOSIS — K219 Gastro-esophageal reflux disease without esophagitis: Secondary | ICD-10-CM | POA: Diagnosis not present

## 2014-10-26 DIAGNOSIS — F79 Unspecified intellectual disabilities: Secondary | ICD-10-CM | POA: Diagnosis not present

## 2014-10-26 DIAGNOSIS — Z79899 Other long term (current) drug therapy: Secondary | ICD-10-CM | POA: Diagnosis not present

## 2014-10-26 DIAGNOSIS — R112 Nausea with vomiting, unspecified: Secondary | ICD-10-CM | POA: Diagnosis not present

## 2014-10-26 DIAGNOSIS — K5901 Slow transit constipation: Secondary | ICD-10-CM | POA: Insufficient documentation

## 2014-10-26 DIAGNOSIS — R109 Unspecified abdominal pain: Secondary | ICD-10-CM

## 2014-10-26 DIAGNOSIS — R1012 Left upper quadrant pain: Secondary | ICD-10-CM | POA: Insufficient documentation

## 2014-10-26 DIAGNOSIS — Z90710 Acquired absence of both cervix and uterus: Secondary | ICD-10-CM | POA: Insufficient documentation

## 2014-10-26 DIAGNOSIS — R1010 Upper abdominal pain, unspecified: Secondary | ICD-10-CM | POA: Diagnosis not present

## 2014-10-26 DIAGNOSIS — G40909 Epilepsy, unspecified, not intractable, without status epilepticus: Secondary | ICD-10-CM | POA: Insufficient documentation

## 2014-10-26 DIAGNOSIS — R1013 Epigastric pain: Secondary | ICD-10-CM | POA: Diagnosis not present

## 2014-10-26 LAB — CBC WITH DIFFERENTIAL/PLATELET
Basophils Absolute: 0 10*3/uL (ref 0.0–0.1)
Basophils Relative: 0 % (ref 0–1)
Eosinophils Absolute: 0.1 10*3/uL (ref 0.0–0.7)
Eosinophils Relative: 2 % (ref 0–5)
HEMATOCRIT: 37.8 % (ref 36.0–46.0)
Hemoglobin: 12.7 g/dL (ref 12.0–15.0)
LYMPHS PCT: 33 % (ref 12–46)
Lymphs Abs: 2.4 10*3/uL (ref 0.7–4.0)
MCH: 32 pg (ref 26.0–34.0)
MCHC: 33.6 g/dL (ref 30.0–36.0)
MCV: 95.2 fL (ref 78.0–100.0)
Monocytes Absolute: 0.9 10*3/uL (ref 0.1–1.0)
Monocytes Relative: 13 % — ABNORMAL HIGH (ref 3–12)
Neutro Abs: 3.9 10*3/uL (ref 1.7–7.7)
Neutrophils Relative %: 52 % (ref 43–77)
PLATELETS: 155 10*3/uL (ref 150–400)
RBC: 3.97 MIL/uL (ref 3.87–5.11)
RDW: 13.4 % (ref 11.5–15.5)
WBC: 7.4 10*3/uL (ref 4.0–10.5)

## 2014-10-26 LAB — COMPREHENSIVE METABOLIC PANEL
ALT: 22 U/L (ref 14–54)
AST: 26 U/L (ref 15–41)
Albumin: 4.1 g/dL (ref 3.5–5.0)
Alkaline Phosphatase: 68 U/L (ref 38–126)
Anion gap: 9 (ref 5–15)
BUN: 16 mg/dL (ref 6–20)
CO2: 29 mmol/L (ref 22–32)
Calcium: 9.7 mg/dL (ref 8.9–10.3)
Chloride: 102 mmol/L (ref 101–111)
Creatinine, Ser: 0.95 mg/dL (ref 0.44–1.00)
GFR calc Af Amer: >60 mL/min (ref >60)
GLUCOSE: 115 mg/dL — AB (ref 65–99)
POTASSIUM: 4.7 mmol/L (ref 3.5–5.1)
SODIUM: 140 mmol/L (ref 135–145)
Total Bilirubin: 0.3 mg/dL (ref 0.3–1.2)
Total Protein: 7.3 g/dL (ref 6.5–8.1)

## 2014-10-26 LAB — LIPASE, BLOOD: LIPASE: 29 U/L (ref 22–51)

## 2014-10-26 MED ORDER — ONDANSETRON HCL 4 MG/2ML IJ SOLN
4.0000 mg | Freq: Once | INTRAMUSCULAR | Status: AC
  Administered 2014-10-26: 4 mg via INTRAVENOUS
  Filled 2014-10-26: qty 2

## 2014-10-26 MED ORDER — SODIUM CHLORIDE 0.9 % IV BOLUS (SEPSIS)
1000.0000 mL | Freq: Once | INTRAVENOUS | Status: AC
  Administered 2014-10-26: 1000 mL via INTRAVENOUS

## 2014-10-26 MED ORDER — MORPHINE SULFATE 4 MG/ML IJ SOLN
4.0000 mg | Freq: Once | INTRAMUSCULAR | Status: AC
  Administered 2014-10-26: 4 mg via INTRAVENOUS
  Filled 2014-10-26: qty 1

## 2014-10-26 NOTE — ED Notes (Signed)
Patient ate [place] food tonight and now c/o abdominal pain.  Patient states she has vomited x 3 tonight.

## 2014-10-26 NOTE — ED Notes (Signed)
Pt reporting pain in LUQ.  Per mother, has esophageal strictures.

## 2014-10-26 NOTE — ED Provider Notes (Signed)
CSN: [id number]     Arrival date & time 10/26/14  2207 History  This chart was scribed for Orpah Greek, MD by Jeanell Sparrow, ED Scribe. This patient was seen in room APA03/APA03 and the patient's care was started at 10:57 PM.   Chief Complaint  Patient presents with  . Abdominal Pain    The history is provided by the patient. No language interpreter was used.    HPI Comments: Maria [name] is a 54 y.o. female who presents to the Emergency Department complaining of constant moderate LUQ abdominal pain that started today. She reports that she hstarted having abdominal pain with nausea and vomiting today after eating [place] food. She states that she had 3 episodes of vomiting today without blood. She describes the pain as a sharp sensation. She states that she has a prior hx of abdominal pain due to esophageal structures. She denies any diarrhea or blood in stool.   Past Medical History  Diagnosis Date  . Seizures   . GERD (gastroesophageal reflux disease)   . Mental retardation     lesion in head   Past Surgical History  Procedure Laterality Date  . Total abdominal hysterectomy    . Tonsillectomy and adenoidectomy    . Skin graft to gum Right 08/2013  . Cataract extraction      both eyes, May of 2015  . Mouth surgery    . Esophagogastroduodenoscopy (egd) with propofol N/A 09/16/2014    Procedure: ESOPHAGOGASTRODUODENOSCOPY (EGD) WITH PROPOFOL;  Surgeon: Rogene Houston, MD;  Location: [facility];  Service: Endoscopy;  Laterality: N/A;  . Esophageal dilation N/A 09/16/2014    Procedure: ESOPHAGEAL DILATION WITH 54FR MALONEY DILATOR;  Surgeon: Rogene Houston, MD;  Location: [facility];  Service: Endoscopy;  Laterality: N/A;  . Esophageal biopsy  09/16/2014    Procedure: BIOPSY;  Surgeon: Rogene Houston, MD;  Location: [facility];  Service: Endoscopy;;   Family History  Problem Relation Age of Onset  . High Cholesterol Mother   . High blood pressure Mother   . Diabetes Father     History  Substance Use Topics  . Smoking status: Former Smoker -- 1.50 packs/day for 15 years    Types: Cigarettes    Quit date: 05/22/2006  . Smokeless tobacco: Never Used  . Alcohol Use: No   OB History    No data available     Review of Systems  Gastrointestinal: Positive for nausea, vomiting and abdominal pain. Negative for diarrhea and blood in stool.  All other systems reviewed and are negative.  Allergies  Codeine and Lamictal  Home Medications   Prior to Admission medications   Medication Sig Start Date End Date Taking? Authorizing Provider  Calcium Carb-Cholecalciferol (CALCIUM 500 +D) 500-400 MG-UNIT TABS Take 1 tablet by mouth 2 (two) times daily.    Historical Provider, MD  Calcium Carbonate Antacid (TUMS PO) Take 1 tablet by mouth as needed (heartburn).     Historical Provider, MD  dexlansoprazole (DEXILANT) 60 MG capsule Take one capsule by mouth 30 minutes before breakfast daily 03/07/14   Rogene Houston, MD  divalproex (DEPAKOTE ER) 500 MG 24 hr tablet Take 1 tablet (500 mg total) by mouth 2 (two) times daily. 04/21/14   Dennie Bible, NP  estradiol (VIVELLE-DOT) 0.075 MG/24HR Place 1 patch onto the skin 2 (two) times a week. On Wednesday and Saturday    Historical Provider, MD  ibuprofen (ADVIL,MOTRIN) 200 MG tablet Take 200 mg by mouth  2 (two) times daily as needed for moderate pain.     Historical Provider, MD  lacosamide (VIMPAT) 200 MG TABS tablet Take 1 tablet (200 mg total) by mouth 2 (two) times daily. 06/15/14   Kathrynn Ducking, MD  latanoprost (XALATAN) 0.005 % ophthalmic solution Place 1 drop into both eyes at bedtime.    Historical Provider, MD  levocetirizine (XYZAL) 5 MG tablet Take 5 mg by mouth every evening.     Historical Provider, MD  oxybutynin (DITROPAN-XL) 5 MG 24 hr tablet Take 5 mg by mouth daily.    Historical Provider, MD  Pediatric Multiple Vit-C-FA (PEDIATRIC MULTIVITAMIN) chewable tablet Chew 1 tablet by mouth daily.       Historical Provider, MD   BP 131/72 mmHg  Pulse 73  Temp(Src) 98 F (36.7 C) (Oral)  Resp 20  Ht 5\' 6"  (1.676 m)  Wt 149 lb 9.6 oz (67.858 kg)  BMI 24.16 kg/m2  SpO2 100% Physical Exam  Constitutional: She is oriented to person, place, and time. She appears well-developed and well-nourished. No distress.  HENT:  Head: Normocephalic and atraumatic.  Right Ear: Hearing normal.  Left Ear: Hearing normal.  Nose: Nose normal.  Mouth/Throat: Oropharynx is clear and moist and mucous membranes are normal.  Eyes: Conjunctivae and EOM are normal. Pupils are equal, round, and reactive to light.  Neck: Normal range of motion. Neck supple.  Cardiovascular: Regular rhythm, S1 normal and S2 normal.  Exam reveals no gallop and no friction rub.   No murmur heard. Pulmonary/Chest: Effort normal and breath sounds normal. No respiratory distress. She exhibits no tenderness.  Abdominal: Soft. Normal appearance and bowel sounds are normal. There is no hepatosplenomegaly. There is tenderness. There is no rebound, no guarding, no tenderness at McBurney's point and negative Murphy's sign. No hernia.  Epigastric and LUQ TTP  Musculoskeletal: Normal range of motion.  Neurological: She is alert and oriented to person, place, and time. She has normal strength. No cranial nerve deficit or sensory deficit. Coordination normal. GCS eye subscore is 4. GCS verbal subscore is 5. GCS motor subscore is 6.  Skin: Skin is warm, dry and intact. No rash noted. No cyanosis.  Psychiatric: She has a normal mood and affect. Her speech is normal and behavior is normal. Thought content normal.  Nursing note and vitals reviewed.   ED Course  Procedures (including critical care time) DIAGNOSTIC STUDIES: Oxygen Saturation is 100% on RA, normal by my interpretation.    COORDINATION OF CARE: 11:01 PM- Pt advised of plan for treatment which includes medication, radiology, and labs and pt agrees.  Labs Review Labs Reviewed   CBC WITH DIFFERENTIAL/PLATELET  COMPREHENSIVE METABOLIC PANEL  LIPASE, BLOOD  URINALYSIS, ROUTINE W REFLEX MICROSCOPIC (NOT AT [facility])    Imaging Review No results found.   EKG Interpretation None      MDM   Final diagnoses:  Abdominal pain, acute   constipation  Presents to the ER for evaluation of abdominal pain. Patient reports the symptoms began after eating a [employer]. Pain is in the left upper abdomen. She reports a history of ulcers in her stomach. She has not had any melanotic stools. No rectal bleeding, no vomiting blood or coffee grounds. She has vomited 3 times tonight, however. Examination reveals tenderness in the left upper quadrant primarily, no guarding or rebound. No right upper quadrant tenderness, no lower abdominal or pelvic pain or tenderness. Vital signs are normal. Lab work is normal. X-ray shows moderate stool  burden, otherwise no acute abnormality. Patient feeling better after IV fluids and morphine. Repeat examination reveals no tenderness. Patient will be discharged, treated for constipation, follow-up with primary doctor. Return if symptoms worsen.  I personally performed the services described in this documentation, which was scribed in my presence. The recorded information has been reviewed and is accurate.      Orpah Greek, MD 10/27/14 [phone]

## 2014-10-27 DIAGNOSIS — J984 Other disorders of lung: Secondary | ICD-10-CM | POA: Diagnosis not present

## 2014-10-27 DIAGNOSIS — R1010 Upper abdominal pain, unspecified: Secondary | ICD-10-CM | POA: Diagnosis not present

## 2014-10-27 LAB — URINALYSIS, ROUTINE W REFLEX MICROSCOPIC
BILIRUBIN URINE: NEGATIVE
Glucose, UA: NEGATIVE mg/dL
Hgb urine dipstick: NEGATIVE
Leukocytes, UA: NEGATIVE
NITRITE: NEGATIVE
PROTEIN: NEGATIVE mg/dL
SPECIFIC GRAVITY, URINE: 1.015 (ref 1.005–1.030)
Urobilinogen, UA: 0.2 mg/dL (ref 0.0–1.0)
pH: 8 (ref 5.0–8.0)

## 2014-10-27 MED ORDER — LACTULOSE 10 GM/15ML PO SOLN: 20.0000 g | Freq: Two times a day (BID) | ORAL | Status: DC | PRN

## 2014-10-27 NOTE — Discharge Instructions (Signed)
Abdominal Pain °Many things can cause abdominal pain. Usually, abdominal pain is not caused by a disease and will improve without treatment. It can often be observed and treated at home. Your health care provider will do a physical exam and possibly order blood tests and X-rays to help determine the seriousness of your pain. However, in many cases, more time must pass before a clear cause of the pain can be found. Before that point, your health care provider may not know if you need more testing or further treatment. °HOME CARE INSTRUCTIONS  °Monitor your abdominal pain for any changes. The following actions may help to alleviate any discomfort you are experiencing: °· Only take over-the-counter or prescription medicines as directed by your health care provider. °· Do not take laxatives unless directed to do so by your health care provider. °· Try a clear liquid diet (broth, tea, or water) as directed by your health care provider. Slowly move to a bland diet as tolerated. °SEEK MEDICAL CARE IF: °· You have unexplained abdominal pain. °· You have abdominal pain associated with nausea or diarrhea. °· You have pain when you urinate or have a bowel movement. °· You experience abdominal pain that wakes you in the night. °· You have abdominal pain that is worsened or improved by eating food. °· You have abdominal pain that is worsened with eating fatty foods. °· You have a fever. °SEEK IMMEDIATE MEDICAL CARE IF:  °· Your pain does not go away within 2 hours. °· You keep throwing up (vomiting). °· Your pain is felt only in portions of the abdomen, such as the right side or the left lower portion of the abdomen. °· You pass bloody or black tarry stools. °MAKE SURE YOU: °· Understand these instructions.   °· Will watch your condition.   °· Will get help right away if you are not doing well or get worse.   °Document Released: 02/27/2005 Document Revised: 05/25/2013 Document Reviewed: 01/27/2013 °[employer]® Patient Information  ©2015 [employer]. This information is not intended to replace advice given to you by your health care provider. Make sure you discuss any questions you have with your health care provider. ° °Constipation °Constipation is when a person has fewer than three bowel movements a week, has difficulty having a bowel movement, or has stools that are dry, hard, or larger than normal. As people grow older, constipation is more common. If you try to fix constipation with medicines that make you have a bowel movement (laxatives), the problem may get worse. Long-term laxative use may cause the muscles of the colon to become weak. A low-fiber diet, not taking in enough fluids, and taking certain medicines may make constipation worse.  °CAUSES  °· Certain medicines, such as antidepressants, pain medicine, iron supplements, antacids, and water pills.   °· Certain diseases, such as diabetes, irritable bowel syndrome (IBS), thyroid disease, or depression.   °· Not drinking enough water.   °· Not eating enough fiber-rich foods.   °· Stress or travel.   °· Lack of physical activity or exercise.   °· Ignoring the urge to have a bowel movement.   °· Using laxatives too much.   °SIGNS AND SYMPTOMS  °· Having fewer than three bowel movements a week.   °· Straining to have a bowel movement.   °· Having stools that are hard, dry, or larger than normal.   °· Feeling full or bloated.   °· Pain in the lower abdomen.   °· Not feeling relief after having a bowel movement.   °DIAGNOSIS  °Your health care provider will take   a medical history and perform a physical exam. Further testing may be done for severe constipation. Some tests may include: °· A barium enema X-ray to examine your rectum, colon, and, sometimes, your small intestine.   °· A sigmoidoscopy to examine your lower colon.   °· A colonoscopy to examine your entire colon. °TREATMENT  °Treatment will depend on the severity of your constipation and what is causing it. Some dietary  treatments include drinking more fluids and eating more fiber-rich foods. Lifestyle treatments may include regular exercise. If these diet and lifestyle recommendations do not help, your health care provider may recommend taking over-the-counter laxative medicines to help you have bowel movements. Prescription medicines may be prescribed if over-the-counter medicines do not work.  °HOME CARE INSTRUCTIONS  °· Eat foods that have a lot of fiber, such as fruits, vegetables, whole grains, and beans. °· Limit foods high in fat and processed sugars, such as french fries, hamburgers, cookies, candies, and soda.   °· A fiber supplement may be added to your diet if you cannot get enough fiber from foods.   °· Drink enough fluids to keep your urine clear or pale yellow.   °· Exercise regularly or as directed by your health care provider.   °· Go to the restroom when you have the urge to go. Do not hold it.   °· Only take over-the-counter or prescription medicines as directed by your health care provider. Do not take other medicines for constipation without talking to your health care provider first.   °SEEK IMMEDIATE MEDICAL CARE IF:  °· You have bright red blood in your stool.   °· Your constipation lasts for more than 4 days or gets worse.   °· You have abdominal or rectal pain.   °· You have thin, pencil-like stools.   °· You have unexplained weight loss. °MAKE SURE YOU:  °· Understand these instructions. °· Will watch your condition. °· Will get help right away if you are not doing well or get worse. °Document Released: 02/16/2004 Document Revised: 05/25/2013 Document Reviewed: 03/01/2013 °Exit[employer]® Patient Information ©2015 [employer]. This information is not intended to replace advice given to you by your health care provider. Make sure you discuss any questions you have with your health care provider. ° °

## 2014-10-30 ENCOUNTER — Emergency Department (HOSPITAL_COMMUNITY): Payer: Medicare Other

## 2014-10-30 ENCOUNTER — Encounter (HOSPITAL_COMMUNITY): Payer: Self-pay | Admitting: *Deleted

## 2014-10-30 ENCOUNTER — Emergency Department (HOSPITAL_COMMUNITY)
Admission: EM | Admit: 2014-10-30 | Discharge: 2014-10-30 | Disposition: A | Discharge status: Home / Self Care | Payer: Medicare Other | Attending: Emergency Medicine | Admitting: Emergency Medicine

## 2014-10-30 DIAGNOSIS — G40909 Epilepsy, unspecified, not intractable, without status epilepticus: Secondary | ICD-10-CM | POA: Insufficient documentation

## 2014-10-30 DIAGNOSIS — Z79899 Other long term (current) drug therapy: Secondary | ICD-10-CM | POA: Diagnosis not present

## 2014-10-30 DIAGNOSIS — S8992XA Unspecified injury of left lower leg, initial encounter: Secondary | ICD-10-CM | POA: Diagnosis not present

## 2014-10-30 DIAGNOSIS — S0990XA Unspecified injury of head, initial encounter: Secondary | ICD-10-CM | POA: Diagnosis not present

## 2014-10-30 DIAGNOSIS — S62609A Fracture of unspecified phalanx of unspecified finger, initial encounter for closed fracture: Secondary | ICD-10-CM

## 2014-10-30 DIAGNOSIS — Y9389 Activity, other specified: Secondary | ICD-10-CM | POA: Diagnosis not present

## 2014-10-30 DIAGNOSIS — S62646A Nondisplaced fracture of proximal phalanx of right little finger, initial encounter for closed fracture: Secondary | ICD-10-CM | POA: Diagnosis not present

## 2014-10-30 DIAGNOSIS — M79644 Pain in right finger(s): Secondary | ICD-10-CM

## 2014-10-30 DIAGNOSIS — W1839XA Other fall on same level, initial encounter: Secondary | ICD-10-CM | POA: Diagnosis not present

## 2014-10-30 DIAGNOSIS — K219 Gastro-esophageal reflux disease without esophagitis: Secondary | ICD-10-CM | POA: Diagnosis not present

## 2014-10-30 DIAGNOSIS — S199XXA Unspecified injury of neck, initial encounter: Secondary | ICD-10-CM | POA: Diagnosis not present

## 2014-10-30 DIAGNOSIS — F79 Unspecified intellectual disabilities: Secondary | ICD-10-CM | POA: Diagnosis not present

## 2014-10-30 DIAGNOSIS — S62616A Displaced fracture of proximal phalanx of right little finger, initial encounter for closed fracture: Secondary | ICD-10-CM | POA: Diagnosis not present

## 2014-10-30 DIAGNOSIS — Z23 Encounter for immunization: Secondary | ICD-10-CM | POA: Insufficient documentation

## 2014-10-30 DIAGNOSIS — Y998 Other external cause status: Secondary | ICD-10-CM | POA: Insufficient documentation

## 2014-10-30 DIAGNOSIS — Z87891 Personal history of nicotine dependence: Secondary | ICD-10-CM | POA: Diagnosis not present

## 2014-10-30 DIAGNOSIS — W19XXXA Unspecified fall, initial encounter: Secondary | ICD-10-CM

## 2014-10-30 DIAGNOSIS — S0181XA Laceration without foreign body of other part of head, initial encounter: Secondary | ICD-10-CM | POA: Diagnosis not present

## 2014-10-30 DIAGNOSIS — Y9289 Other specified places as the place of occurrence of the external cause: Secondary | ICD-10-CM | POA: Insufficient documentation

## 2014-10-30 LAB — VALPROIC ACID LEVEL: Valproic Acid Lvl: 110 ug/mL — ABNORMAL HIGH (ref 50.0–100.0)

## 2014-10-30 LAB — CBG MONITORING, ED: Glucose-Capillary: 109 mg/dL — ABNORMAL HIGH (ref 65–99)

## 2014-10-30 MED ORDER — HYDROCODONE-ACETAMINOPHEN 5-325 MG PO TABS
2.0000 | ORAL_TABLET | Freq: Once | ORAL | Status: AC
  Administered 2014-10-30: 2 via ORAL
  Filled 2014-10-30: qty 2

## 2014-10-30 MED ORDER — LORAZEPAM 2 MG/ML IJ SOLN
0.5000 mg | Freq: Once | INTRAMUSCULAR | Status: AC
  Administered 2014-10-30: 0.5 mg via INTRAVENOUS
  Filled 2014-10-30: qty 1

## 2014-10-30 MED ORDER — HYDROCODONE-ACETAMINOPHEN 5-325 MG PO TABS: 1.0000 | ORAL_TABLET | ORAL | Status: DC | PRN

## 2014-10-30 MED ORDER — LIDOCAINE-EPINEPHRINE (PF) 1 %-1:200000 IJ SOLN
INTRAMUSCULAR | Status: AC
  Filled 2014-10-30: qty 10

## 2014-10-30 MED ORDER — LIDOCAINE-EPINEPHRINE-TETRACAINE (LET) SOLUTION
3.0000 mL | Freq: Once | NASAL | Status: AC
  Administered 2014-10-30: 3 mL via TOPICAL
  Filled 2014-10-30: qty 3

## 2014-10-30 MED ORDER — ONDANSETRON HCL 4 MG PO TABS
4.0000 mg | ORAL_TABLET | Freq: Once | ORAL | Status: AC
  Administered 2014-10-30: 4 mg via ORAL
  Filled 2014-10-30: qty 1

## 2014-10-30 MED ORDER — TETANUS-DIPHTH-ACELL PERTUSSIS 5-2.5-18.5 LF-MCG/0.5 IM SUSP
0.5000 mL | Freq: Once | INTRAMUSCULAR | Status: AC
  Administered 2014-10-30: 0.5 mL via INTRAMUSCULAR
  Filled 2014-10-30: qty 0.5

## 2014-10-30 NOTE — ED Notes (Addendum)
Per pt's mother, pt came in to her room with her head bleeding and 5th finger on rt hand swollen. Mother states the pt may have had a seizure, pt has hx of same. Pt has large swollen area above rt eye with lac, bleeding controlled, 5th finger on rt hand has obvious deformity. Pt also co rt knee pain. Pt's mental status is baseline at the moment per pt's mother, pt has hx of mental retardation.

## 2014-10-30 NOTE — Discharge Instructions (Signed)
Your finger xray reveals a non-displaced fracture of the right fifth finger. Please see Dr Aline Brochure for follow up and management of this. Please do not remove the splint. Use tylenol or ibuprofen for mild pain. Use norco for more severe pain. Your CT head scan is negative for fracture or bleeding. Please have the sutures to the forehead removed in five (5) days. Sutured Wound Care Sutures are stitches that can be used to close wounds. Caring for your wound can help stop infection and lessen pain. HOME CARE   Rest and raise (elevate) the injured area until the pain and puffiness (swelling) go away.  Only take medicines as told by your doctor.  Clean the wound gently with mild soap and water once a day after the first 2 days. Rinse off the soap. Pat the area dry with a clean towel. Do not rub the wound.  Change the bandage (dressing) as told by your doctor. If the bandage sticks, soak it off with soapy water. Stop using a bandage after 2 days or after the wound stops leaking fluid.  Put cream on the wound as told by your doctor.  Do not stretch the wound.  Drink enough fluids to keep your pee (urine) clear or pale yellow.  See your doctor to have the sutures removed.  Use sunscreen or sunblock on the wound after it heals. GET HELP RIGHT AWAY IF:   Your wound gets red, puffy, hot, or tender.  You have more pain in the wound.  You have a red streak that goes away from the wound.  You see yellowish-white fluid (pus) coming out of the wound.  You have a fever.  You have chills and start to shake.  You notice a bad smell coming from the wound.  Your wound will not stop bleeding. MAKE SURE YOU:   Understand these instructions.  Will watch your condition.  Will get help right away if you are not doing well or get worse. Document Released: 11/06/2007 Document Revised: 08/12/2011 Document Reviewed: 09/23/2010 [facility] Patient Information 2015 [place], [place]. This information is  not intended to replace advice given to you by your health care provider. Make sure you discuss any questions you have with your health care provider.  Finger Fracture Fractures of fingers are breaks in the bones of the fingers. There are many types of fractures. There are different ways of treating these fractures. Your health care provider will discuss the best way to treat your fracture. CAUSES Traumatic injury is the main cause of broken fingers. These include:  Injuries while playing sports.  Workplace injuries.  Falls. RISK FACTORS Activities that can increase your risk of finger fractures include:  Sports.  Workplace activities that involve machinery.  A condition called osteoporosis, which can make your bones less dense and cause them to fracture more easily. SIGNS AND SYMPTOMS The main symptoms of a broken finger are pain and swelling within 15 minutes after the injury. Other symptoms include:  Bruising of your finger.  Stiffness of your finger.  Numbness of your finger.  Exposed bones (compound fracture) if the fracture is severe. DIAGNOSIS  The best way to diagnose a broken bone is with X-ray imaging. Additionally, your health care provider will use this X-ray image to evaluate the position of the broken finger bones.  TREATMENT  Finger fractures can be treated with:   Nonreduction--This means the bones are in place. The finger is splinted without changing the positions of the bone pieces. The splint is  usually left on for about a week to 10 days. This will depend on your fracture and what your health care provider thinks.  Closed reduction--The bones are put back into position without using surgery. The finger is then splinted.  Open reduction and internal fixation--The fracture site is opened. Then the bone pieces are fixed into place with pins or some type of hardware. This is seldom required. It depends on the severity of the fracture. HOME CARE INSTRUCTIONS    Follow your health care provider's instructions regarding activities, exercises, and physical therapy.  Only take over-the-counter or prescription medicines for pain, discomfort, or fever as directed by your health care provider. SEEK MEDICAL CARE IF: You have pain or swelling that limits the motion or use of your fingers. SEEK IMMEDIATE MEDICAL CARE IF:  Your finger becomes numb. MAKE SURE YOU:   Understand these instructions.  Will watch your condition.  Will get help right away if you are not doing well or get worse. Document Released: 09/01/2000 Document Revised: 03/10/2013 Document Reviewed: 12/30/2012 [facility] Patient Information 2015 [place], [place]. This information is not intended to replace advice given to you by your health care provider. Make sure you discuss any questions you have with your health care provider.

## 2014-11-01 NOTE — ED Provider Notes (Signed)
CSN: [id number]     Arrival date & time 10/30/14  8676 History   First MD Initiated Contact with Patient 10/30/14 0914     Chief Complaint  Patient presents with  . Head Laceration     (Consider location/radiation/quality/duration/timing/severity/associated sxs/prior Treatment) HPI Comments: Patient is a 54 year old female with a history of mental retardation, and seizures who presents to the emergency department with a complaint of laceration to the right for head, and injury to the right hand.  The patient's mother is the primary historian. The mother states that just prior to coming to the emergency department the patient came into her room and was noted to have bleeding from her head and complaining of pain of the right fifth finger. No fall or injury was witnessed, but the mother states that the patient may have had a seizure she has a history of seizures from time to time. The mother noted bleeding primarily above the right eye. Mother also noted some swelling and possible deformity involving the right fifth finger. The patient also complained of some soreness of the right knee. The mother has not noticed any change in the patient's general mental status. The patient is not on any any coagulation medications.  Patient is a 54 y.o. female presenting with scalp laceration. The history is provided by a parent.  Head Laceration Associated symptoms include headaches.    Past Medical History  Diagnosis Date  . Seizures   . GERD (gastroesophageal reflux disease)   . Mental retardation     lesion in head   Past Surgical History  Procedure Laterality Date  . Total abdominal hysterectomy    . Tonsillectomy and adenoidectomy    . Skin graft to gum Right 08/2013  . Cataract extraction      both eyes, May of 2015  . Mouth surgery    . Esophagogastroduodenoscopy (egd) with propofol N/A 09/16/2014    Procedure: ESOPHAGOGASTRODUODENOSCOPY (EGD) WITH PROPOFOL;  Surgeon: Rogene Houston, MD;   Location: [facility];  Service: Endoscopy;  Laterality: N/A;  . Esophageal dilation N/A 09/16/2014    Procedure: ESOPHAGEAL DILATION WITH 54FR MALONEY DILATOR;  Surgeon: Rogene Houston, MD;  Location: [facility];  Service: Endoscopy;  Laterality: N/A;  . Esophageal biopsy  09/16/2014    Procedure: BIOPSY;  Surgeon: Rogene Houston, MD;  Location: [facility];  Service: Endoscopy;;   Family History  Problem Relation Age of Onset  . High Cholesterol Mother   . High blood pressure Mother   . Diabetes Father    History  Substance Use Topics  . Smoking status: Former Smoker -- 1.50 packs/day for 15 years    Types: Cigarettes    Quit date: 05/22/2006  . Smokeless tobacco: Never Used  . Alcohol Use: No   OB History    No data available     Review of Systems  Neurological: Positive for seizures and headaches.  All other systems reviewed and are negative.     Allergies  Codeine and Lamictal  Home Medications   Prior to Admission medications   Medication Sig Start Date End Date Taking? Authorizing Provider  Calcium Carb-Cholecalciferol (CALCIUM 500 +D) 500-400 MG-UNIT TABS Take 1 tablet by mouth 2 (two) times daily.   Yes Historical Provider, MD  dexlansoprazole (DEXILANT) 60 MG capsule Take one capsule by mouth 30 minutes before breakfast daily 03/07/14  Yes Rogene Houston, MD  divalproex (DEPAKOTE ER) 500 MG 24 hr tablet Take 1 tablet (500 mg total) by mouth  2 (two) times daily. 04/21/14  Yes Dennie Bible, NP  estradiol (VIVELLE-DOT) 0.075 MG/24HR Place 1 patch onto the skin 2 (two) times a week. On Wednesday and Saturday   Yes Historical Provider, MD  lacosamide (VIMPAT) 200 MG TABS tablet Take 1 tablet (200 mg total) by mouth 2 (two) times daily. 06/15/14  Yes Kathrynn Ducking, MD  latanoprost (XALATAN) 0.005 % ophthalmic solution Place 1 drop into both eyes at bedtime.   Yes Historical Provider, MD  levocetirizine (XYZAL) 5 MG tablet Take 5 mg by mouth every evening.    Yes Historical  Provider, MD  oxybutynin (DITROPAN-XL) 5 MG 24 hr tablet Take 5 mg by mouth daily.   Yes Historical Provider, MD  Pediatric Multiple Vit-C-FA (PEDIATRIC MULTIVITAMIN) chewable tablet Chew 1 tablet by mouth daily.     Yes Historical Provider, MD  Calcium Carbonate Antacid (TUMS PO) Take 1 tablet by mouth as needed (heartburn).     Historical Provider, MD  HYDROcodone-acetaminophen (NORCO/VICODIN) 5-325 MG per tablet Take 1 tablet by mouth every 4 (four) hours as needed. Take this medication with food 10/30/14   Lily Kocher, PA-C  ibuprofen (ADVIL,MOTRIN) 200 MG tablet Take 200 mg by mouth 2 (two) times daily as needed for moderate pain.     Historical Provider, MD  lactulose (CHRONULAC) 10 GM/15ML solution Take 30 mLs (20 g total) by mouth 2 (two) times daily as needed for mild constipation or moderate constipation. Patient not taking: Reported on 10/30/2014 10/27/14   Orpah Greek, MD   BP 124/82 mmHg  Pulse 72  Temp(Src) 98.3 F (36.8 C) (Oral)  Resp 15  Ht 5\' 6"  (1.676 m)  Wt 149 lb (67.586 kg)  BMI 24.06 kg/m2  SpO2 100% Physical Exam  Constitutional: She is oriented to person, place, and time. She appears well-developed and well-nourished.  Non-toxic appearance.  HENT:  Head: Normocephalic.    Right Ear: Tympanic membrane and external ear normal.  Left Ear: Tympanic membrane and external ear normal.  Eyes: EOM and lids are normal. Pupils are equal, round, and reactive to light.  Neck: Normal range of motion. Neck supple. Carotid bruit is not present.  Cardiovascular: Normal rate, regular rhythm, normal heart sounds, intact distal pulses and normal pulses.   Pulmonary/Chest: Breath sounds normal. No respiratory distress.  Abdominal: Soft. Bowel sounds are normal. There is no tenderness. There is no guarding.  Musculoskeletal: Normal range of motion.       Right knee: She exhibits normal range of motion, no deformity, no erythema and normal alignment. Tenderness found.        Right hand: She exhibits tenderness, deformity and swelling. She exhibits normal capillary refill.  Lymphadenopathy:       Head (right side): No submandibular adenopathy present.       Head (left side): No submandibular adenopathy present.    She has no cervical adenopathy.  Neurological: She is alert and oriented to person, place, and time. She has normal strength. No cranial nerve deficit or sensory deficit.  Skin: Skin is warm and dry.  Psychiatric: She has a normal mood and affect. Her speech is normal.  Nursing note and vitals reviewed.   ED Course  LACERATION REPAIR Date/Time: 11/01/2014 10:56 PM Performed by: Lily Kocher Authorized by: Lily Kocher Consent: Verbal consent obtained. Risks and benefits: risks, benefits and alternatives were discussed Consent given by: parent Patient understanding: patient states understanding of the procedure being performed Patient identity confirmed: arm band Time out: Immediately  prior to procedure a "time out" was called to verify the correct patient, procedure, equipment, support staff and site/side marked as required. Body area: head/neck Location details: forehead Laceration length: 3.4 cm Foreign bodies: no foreign bodies Anesthesia: local infiltration Local anesthetic: lidocaine 1% with epinephrine and LET (lido,epi,tetracaine) Patient sedated: no Preparation: Patient was prepped and draped in the usual sterile fashion. Irrigation solution: saline Amount of cleaning: standard Skin closure: 5-0 nylon Number of sutures: 4 Technique: simple Approximation: close Approximation difficulty: simple Patient tolerance: Patient tolerated the procedure well with no immediate complications   (including critical care time)  FRACTURE CARE: Patient is a 54 year old female who sustained a fall. The patient is mentally challenged, and the mother gives the primary history. The x-ray of the right hand was used to demonstrate the fracture of  the proximal phalanx of the right fifth finger. This was discussed with the patient and the mother in terms which they understand. The procedure to apply finger splint was also discussed with the mother in terms which she understand, and she gives permission for the procedure. The patient was identified by arm band. Finger splint was applied to the right fifth finger without problem. Patient tolerated the procedure without problem.  The patient will follow-up with orthopedics. The patient will use Tylenol or ibuprofen for mild pain, prescription for Norco was given for more severe pain. Questions were answered. Labs Review Labs Reviewed  VALPROIC ACID LEVEL - Abnormal; Notable for the following:    Valproic Acid Lvl 110 (*)    All other components within normal limits  CBG MONITORING, ED - Abnormal; Notable for the following:    Glucose-Capillary 109 (*)    All other components within normal limits    Imaging Review No results found.   EKG Interpretation None      MDM  Vital signs are well within normal limits. Pulse oximetry is 95% on room air. Within normal limits by my interpretation. CT scan of the head and cervical spine were negative for any acute findings. Valproic acid is actually high at 110, CBG was normal at 109. Doubt hypoglycemia as reason for fall.  Patient sustained a laceration to the right forehand, which was repaired with 4 interrupted sutures of 5-0 nylon. The patient sustained an injury to the fifth finger of the right hand, and as noted by x-ray to have a nondisplaced fracture. The patient was fitted with a finger splint, and is to follow-up with orthopedics for additional evaluation and management. I've advised the patient have the sutures removed in about 5 days. The mother has been given instructions to return if any signs of infection, or any deterioration in her general condition.    Final diagnoses:  Fall  Finger pain, right  Laceration of forehead without  complication, initial encounter  Finger fracture, closed, initial encounter    **I have reviewed nursing notes, vital signs, and all appropriate lab and imaging results for this patient.Lily Kocher, PA-C 11/01/14 [place], DO 11/03/14 2001

## 2014-11-02 DIAGNOSIS — Z0000 Encounter for general adult medical examination without abnormal findings: Secondary | ICD-10-CM | POA: Diagnosis not present

## 2014-11-02 DIAGNOSIS — Z6823 Body mass index (BMI) 23.0-23.9, adult: Secondary | ICD-10-CM | POA: Diagnosis not present

## 2014-11-03 ENCOUNTER — Encounter: Payer: Self-pay | Admitting: Orthopedic Surgery

## 2014-11-03 ENCOUNTER — Ambulatory Visit (INDEPENDENT_AMBULATORY_CARE_PROVIDER_SITE_OTHER): Payer: Medicare Other | Admitting: Orthopedic Surgery

## 2014-11-03 VITALS — BP 104/70 | Ht 66.0 in | Wt 149.0 lb

## 2014-11-03 DIAGNOSIS — S62602A Fracture of unspecified phalanx of right middle finger, initial encounter for closed fracture: Secondary | ICD-10-CM

## 2014-11-03 DIAGNOSIS — S62609A Fracture of unspecified phalanx of unspecified finger, initial encounter for closed fracture: Secondary | ICD-10-CM | POA: Diagnosis not present

## 2014-11-03 NOTE — Progress Notes (Signed)
Patient ID: Maria [name], female   DOB: 1960-06-13, 54 y.o.   MRN: [id number] Chief Complaint  Patient presents with  . Hand Injury    Rt small finger fracture, DOI 10/30/14    History this is a 54 year old female with mental deficiencies presents after falling and injuring her right small finger she has an oblique spiral ulnarly angulated finger secondary to fracture the proximal phalanx  Complains of pain swelling loss of motion  Denies numbness tingling  Complains of laceration over the right forehead and bruising under the eyes secondary to the fall fall was not witnessed patient has history of seizures  Past Medical History  Diagnosis Date  . Seizures   . GERD (gastroesophageal reflux disease)   . Mental retardation     lesion in head   BP 104/70 mmHg  Ht 5\' 6"  (1.676 m)  Wt 149 lb (67.586 kg)  BMI 24.06 kg/m2 Otherwise her appearance is normal she has no gross deformities. She is oriented to person place and time her mood is flat her affect is flat  Her finger is ulnarly deviated swollen is tender she has loss of motion at the PIP and DIP joints is a stable fracture and terms of any gross motion but x-ray pattern shows unstable with obliquity and spiral fracture I did not detect extensor or flexor tendon loss of function. The skin is intact just ecchymotic the sensations normal capillary refill is good and other than the bruising under the eyes and the laceration on the forehead exam was normal   Imaging includes appropriate film shows a spiral fracture with ulnar deviation and shortening  I told she could have surgery and correct the deformity but may lose motion at the PIP joint which accepted deformity we could start early motion next week  She opted for early motion next week and splinting  We placed her in a splint with flexion of the PIP joint we plan x-ray next week and buddy taping

## 2014-11-04 DIAGNOSIS — S62606D Fracture of unspecified phalanx of right little finger, subsequent encounter for fracture with routine healing: Secondary | ICD-10-CM | POA: Diagnosis not present

## 2014-11-04 DIAGNOSIS — Z6823 Body mass index (BMI) 23.0-23.9, adult: Secondary | ICD-10-CM | POA: Diagnosis not present

## 2014-11-04 DIAGNOSIS — Z4802 Encounter for removal of sutures: Secondary | ICD-10-CM | POA: Diagnosis not present

## 2014-11-10 DIAGNOSIS — S62629A Displaced fracture of medial phalanx of unspecified finger, initial encounter for closed fracture: Secondary | ICD-10-CM | POA: Diagnosis not present

## 2014-11-15 ENCOUNTER — Ambulatory Visit: Payer: Medicare Other | Admitting: Orthopedic Surgery

## 2014-11-17 DIAGNOSIS — S62629A Displaced fracture of medial phalanx of unspecified finger, initial encounter for closed fracture: Secondary | ICD-10-CM | POA: Diagnosis not present

## 2014-11-28 ENCOUNTER — Ambulatory Visit: Payer: Medicare Other | Admitting: Neurology

## 2014-12-01 DIAGNOSIS — S62629D Displaced fracture of medial phalanx of unspecified finger, subsequent encounter for fracture with routine healing: Secondary | ICD-10-CM | POA: Diagnosis not present

## 2014-12-14 ENCOUNTER — Ambulatory Visit (INDEPENDENT_AMBULATORY_CARE_PROVIDER_SITE_OTHER): Payer: Medicare Other | Admitting: Neurology

## 2014-12-14 ENCOUNTER — Encounter: Payer: Self-pay | Admitting: Neurology

## 2014-12-14 VITALS — BP 110/71 | HR 69 | Ht 66.0 in | Wt 143.6 lb

## 2014-12-14 DIAGNOSIS — G40309 Generalized idiopathic epilepsy and epileptic syndromes, not intractable, without status epilepticus: Secondary | ICD-10-CM | POA: Diagnosis not present

## 2014-12-14 DIAGNOSIS — F79 Unspecified intellectual disabilities: Secondary | ICD-10-CM | POA: Diagnosis not present

## 2014-12-14 MED ORDER — LACOSAMIDE 200 MG PO TABS: 200.0000 mg | ORAL_TABLET | Freq: Two times a day (BID) | ORAL | Status: DC

## 2014-12-14 NOTE — Patient Instructions (Signed)

## 2014-12-14 NOTE — Progress Notes (Signed)
Reason for visit: Seizures  Maria [name] is an 54 y.o. female  History of present illness:  Maria [name] is a 54 year old right-handed white female with a history of intractable epilepsy. The patient is on fairly maximal doses of Depakote and Vimpat. The patient is tolerating these medications well, and these medications have reduced the frequency of the seizures, but the patient still has 3 or 4 seizures a year. The last known seizure was around 10/19/2014. The patient fell on 10/30/2014 and fractured her right hand. It is not clear that she had a seizure associated with the fall. The patient herself remembers the fall. The patient is still in a cast or splint on the right hand and wrist. The patient otherwise is functioning fairly well. She has had blood work done in May 2016 that included a valproic acid level with a result of 110. A CBC and a comprehensive metabolic profile were also done, these studies were unremarkable.  Past Medical History  Diagnosis Date  . Seizures   . GERD (gastroesophageal reflux disease)   . Mental retardation     lesion in head    Past Surgical History  Procedure Laterality Date  . Total abdominal hysterectomy    . Tonsillectomy and adenoidectomy    . Skin graft to gum Right 08/2013  . Cataract extraction      both eyes, May of 2015  . Mouth surgery    . Esophagogastroduodenoscopy (egd) with propofol N/A 09/16/2014    Procedure: ESOPHAGOGASTRODUODENOSCOPY (EGD) WITH PROPOFOL;  Surgeon: Rogene Houston, MD;  Location: [facility];  Service: Endoscopy;  Laterality: N/A;  . Esophageal dilation N/A 09/16/2014    Procedure: ESOPHAGEAL DILATION WITH 54FR MALONEY DILATOR;  Surgeon: Rogene Houston, MD;  Location: [facility];  Service: Endoscopy;  Laterality: N/A;  . Esophageal biopsy  09/16/2014    Procedure: BIOPSY;  Surgeon: Rogene Houston, MD;  Location: [facility];  Service: Endoscopy;;    Family History  Problem Relation Age of Onset  . High Cholesterol Mother     . High blood pressure Mother   . Diabetes Father     Social history:  reports that she quit smoking about 8 years ago. Her smoking use included Cigarettes. She has a 22.5 pack-year smoking history. She has never used smokeless tobacco. She reports that she does not drink alcohol or use illicit drugs.    Allergies  Allergen Reactions  . Codeine     REACTION: nausea, vomiting  . Lamictal [Lamotrigine]     Skin rash    Medications:  Prior to Admission medications   Medication Sig Start Date End Date Taking? Authorizing Provider  Calcium Carb-Cholecalciferol (CALCIUM 500 +D) 500-400 MG-UNIT TABS Take 1 tablet by mouth 2 (two) times daily.   Yes Historical Provider, MD  Calcium Carbonate Antacid (TUMS PO) Take 1 tablet by mouth as needed (heartburn).    Yes Historical Provider, MD  dexlansoprazole (DEXILANT) 60 MG capsule Take one capsule by mouth 30 minutes before breakfast daily 03/07/14  Yes Rogene Houston, MD  divalproex (DEPAKOTE ER) 500 MG 24 hr tablet Take 1 tablet (500 mg total) by mouth 2 (two) times daily. 04/21/14  Yes Dennie Bible, NP  estradiol (VIVELLE-DOT) 0.075 MG/24HR Place 1 patch onto the skin 2 (two) times a week. On Wednesday and Saturday   Yes Historical Provider, MD  HYDROcodone-acetaminophen (NORCO/VICODIN) 5-325 MG per tablet Take 1 tablet by mouth every 4 (four) hours as needed. Take this  medication with food 10/30/14  Yes Lily Kocher, PA-C  ibuprofen (ADVIL,MOTRIN) 200 MG tablet Take 200 mg by mouth 2 (two) times daily as needed for moderate pain.    Yes Historical Provider, MD  lacosamide (VIMPAT) 200 MG TABS tablet Take 1 tablet (200 mg total) by mouth 2 (two) times daily. 12/14/14  Yes Kathrynn Ducking, MD  latanoprost (XALATAN) 0.005 % ophthalmic solution Place 1 drop into both eyes at bedtime.   Yes Historical Provider, MD  levocetirizine (XYZAL) 5 MG tablet Take 5 mg by mouth every evening.    Yes Historical Provider, MD  oxybutynin (DITROPAN-XL) 5 MG  24 hr tablet Take 5 mg by mouth daily.   Yes Historical Provider, MD  Pediatric Multiple Vit-C-FA (PEDIATRIC MULTIVITAMIN) chewable tablet Chew 1 tablet by mouth daily.     Yes Historical Provider, MD    ROS:  Out of a complete 14 system review of symptoms, the patient complains only of the following symptoms, and all other reviewed systems are negative.  Difficulty swallowing Flushing Muscle cramps Bruising easily Seizures Skin rash  Blood pressure 110/71, pulse 69, height 5\' 6"  (1.676 m), weight 143 lb 9.6 oz (65.137 kg).  Physical Exam  General: The patient is alert and cooperative at the time of the examination.  Skin: No significant peripheral edema is noted.   Neurologic Exam  Mental status: The patient is alert and oriented x 3 at the time of the examination. The patient has apparent normal recent and remote memory, with an apparently normal attention span and concentration ability.   Cranial nerves: Facial symmetry is present. Speech is normal, no aphasia or dysarthria is noted. Extraocular movements are full. Visual fields are full.  Motor: The patient has good strength in all 4 extremities.  Sensory examination: Soft touch sensation is symmetric on the face, arms, and legs.  Coordination: The patient has good finger-nose-finger and heel-to-shin bilaterally.  Gait and station: The patient has a normal gait. Tandem gait is slightly unsteady. Romberg is negative. No drift is seen.  Reflexes: Deep tendon reflexes are symmetric.   Assessment/Plan:  1. Intractable epilepsy  2. Mild mental retardation  The patient continues to have ongoing seizures that are better controlled but not fully controlled with the combination of Depakote and Vimpat. The patient will continue these medications. Blood work was done recently, there is no indication for blood work today. She will follow-up in 9 months, a prescription was given for the Vimpat.  Jill Alexanders MD 12/14/2014  8:00 PM  [facility] [address] [place], [place] [address]  Phone [phone] Fax [phone]

## 2014-12-29 DIAGNOSIS — S62629D Displaced fracture of medial phalanx of unspecified finger, subsequent encounter for fracture with routine healing: Secondary | ICD-10-CM | POA: Diagnosis not present

## 2015-01-04 DIAGNOSIS — Z1211 Encounter for screening for malignant neoplasm of colon: Secondary | ICD-10-CM | POA: Diagnosis not present

## 2015-01-15 ENCOUNTER — Emergency Department (HOSPITAL_COMMUNITY): Payer: Medicare Other

## 2015-01-15 ENCOUNTER — Encounter (HOSPITAL_COMMUNITY): Payer: Self-pay | Admitting: Emergency Medicine

## 2015-01-15 ENCOUNTER — Emergency Department (HOSPITAL_COMMUNITY)
Admission: EM | Admit: 2015-01-15 | Discharge: 2015-01-15 | Disposition: A | Discharge status: Home / Self Care | Payer: Medicare Other | Attending: Emergency Medicine | Admitting: Emergency Medicine

## 2015-01-15 DIAGNOSIS — Y998 Other external cause status: Secondary | ICD-10-CM | POA: Insufficient documentation

## 2015-01-15 DIAGNOSIS — K219 Gastro-esophageal reflux disease without esophagitis: Secondary | ICD-10-CM | POA: Insufficient documentation

## 2015-01-15 DIAGNOSIS — Y9289 Other specified places as the place of occurrence of the external cause: Secondary | ICD-10-CM | POA: Insufficient documentation

## 2015-01-15 DIAGNOSIS — Y9389 Activity, other specified: Secondary | ICD-10-CM | POA: Diagnosis not present

## 2015-01-15 DIAGNOSIS — G40909 Epilepsy, unspecified, not intractable, without status epilepticus: Secondary | ICD-10-CM | POA: Diagnosis not present

## 2015-01-15 DIAGNOSIS — W06XXXA Fall from bed, initial encounter: Secondary | ICD-10-CM | POA: Insufficient documentation

## 2015-01-15 DIAGNOSIS — S8001XA Contusion of right knee, initial encounter: Secondary | ICD-10-CM | POA: Insufficient documentation

## 2015-01-15 DIAGNOSIS — S8002XA Contusion of left knee, initial encounter: Secondary | ICD-10-CM | POA: Diagnosis not present

## 2015-01-15 DIAGNOSIS — S8991XA Unspecified injury of right lower leg, initial encounter: Secondary | ICD-10-CM | POA: Diagnosis not present

## 2015-01-15 DIAGNOSIS — M25561 Pain in right knee: Secondary | ICD-10-CM | POA: Diagnosis not present

## 2015-01-15 DIAGNOSIS — M25562 Pain in left knee: Secondary | ICD-10-CM | POA: Diagnosis not present

## 2015-01-15 DIAGNOSIS — Z87891 Personal history of nicotine dependence: Secondary | ICD-10-CM | POA: Insufficient documentation

## 2015-01-15 DIAGNOSIS — S8000XA Contusion of unspecified knee, initial encounter: Secondary | ICD-10-CM

## 2015-01-15 DIAGNOSIS — Z8659 Personal history of other mental and behavioral disorders: Secondary | ICD-10-CM | POA: Diagnosis not present

## 2015-01-15 DIAGNOSIS — S8992XA Unspecified injury of left lower leg, initial encounter: Secondary | ICD-10-CM | POA: Diagnosis not present

## 2015-01-15 DIAGNOSIS — Z79899 Other long term (current) drug therapy: Secondary | ICD-10-CM | POA: Diagnosis not present

## 2015-01-15 LAB — VALPROIC ACID LEVEL: Valproic Acid Lvl: 116 ug/mL — ABNORMAL HIGH (ref 50.0–100.0)

## 2015-01-15 MED ORDER — IBUPROFEN 400 MG PO TABS
600.0000 mg | ORAL_TABLET | Freq: Once | ORAL | Status: AC
  Administered 2015-01-15: 600 mg via ORAL
  Filled 2015-01-15: qty 2

## 2015-01-15 MED ORDER — IBUPROFEN 600 MG PO TABS: 600.0000 mg | ORAL_TABLET | Freq: Four times a day (QID) | ORAL | Status: DC | PRN

## 2015-01-15 NOTE — ED Notes (Signed)
Pt ambulated to the restroom without any trouble.

## 2015-01-15 NOTE — Discharge Instructions (Signed)
Contusion A contusion is a deep bruise. Contusions are the result of an injury that caused bleeding under the skin. The contusion may turn blue, purple, or yellow. Minor injuries will give you a painless contusion, but more severe contusions may stay painful and swollen for a few weeks.  CAUSES  A contusion is usually caused by a blow, trauma, or direct force to an area of the body. SYMPTOMS   Swelling and redness of the injured area.  Bruising of the injured area.  Tenderness and soreness of the injured area.  Pain. DIAGNOSIS  The diagnosis can be made by taking a history and physical exam. An X-ray, CT scan, or MRI may be needed to determine if there were any associated injuries, such as fractures. TREATMENT  Specific treatment will depend on what area of the body was injured. In general, the best treatment for a contusion is resting, icing, elevating, and applying cold compresses to the injured area. Over-the-counter medicines may also be recommended for pain control. Ask your caregiver what the best treatment is for your contusion. HOME CARE INSTRUCTIONS   Put ice on the injured area.  Put ice in a plastic bag.  Place a towel between your skin and the bag.  Leave the ice on for 15-20 minutes, 3-4 times a day, or as directed by your health care provider.  Only take over-the-counter or prescription medicines for pain, discomfort, or fever as directed by your caregiver. Your caregiver may recommend avoiding anti-inflammatory medicines (aspirin, ibuprofen, and naproxen) for 48 hours because these medicines may increase bruising.  Rest the injured area.  If possible, elevate the injured area to reduce swelling. SEEK IMMEDIATE MEDICAL CARE IF:   You have increased bruising or swelling.  You have pain that is getting worse.  Your swelling or pain is not relieved with medicines. MAKE SURE YOU:   Understand these instructions.  Will watch your condition.  Will get help right  away if you are not doing well or get worse. Document Released: 02/27/2005 Document Revised: 05/25/2013 Document Reviewed: 03/25/2011 [facility] Patient Information 2015 [place], [place]. This information is not intended to replace advice given to you by your health care provider. Make sure you discuss any questions you have with your health care provider.   Your x-rays are negative today for any sign of a bony injury including fracture or dislocation of your knees.  You may use ibuprofen prescribed as needed for discomfort.  Also applying an ice pack as much as it feels good to will help with pain and swelling.  Follow-up with your doctor for recheck if your symptoms are not improving over the next week to 10 days.

## 2015-01-15 NOTE — ED Notes (Signed)
Pt reports rolled out of bed this am and landed on bilateral knees. Pt reports frequent falls,seizures last several months. nad noted.

## 2015-01-17 NOTE — ED Provider Notes (Signed)
CSN: [id number]     Arrival date & time 01/15/15  0736 History   First MD Initiated Contact with Patient 01/15/15 [phone]     Chief Complaint  Patient presents with  . Fall     (Consider location/radiation/quality/duration/timing/severity/associated sxs/prior Treatment) The history is provided by the patient, a parent and a relative.   RAELENE TREW is a 54 y.o. female with a past medical history significant for seizure disorder and MR presenting with bilateral knee pain since falling this am.  She reports she got up to go to the bathroom this am and when stepping to get back in bed, fell,  Landing on her bilateral knees.  Brother heard her fall and when he got to her room she was kneeling in front of the nightstand.  She denies any other pain or injury and did not hit her head.  Also denies seizure activity, last seizure occurred more than a month ago.  She was able to weight bear after the fall. She has had no treatment prior to arrival.    Past Medical History  Diagnosis Date  . Seizures   . GERD (gastroesophageal reflux disease)   . Mental retardation     lesion in head   Past Surgical History  Procedure Laterality Date  . Total abdominal hysterectomy    . Tonsillectomy and adenoidectomy    . Skin graft to gum Right 08/2013  . Cataract extraction      both eyes, May of 2015  . Mouth surgery    . Esophagogastroduodenoscopy (egd) with propofol N/A 09/16/2014    Procedure: ESOPHAGOGASTRODUODENOSCOPY (EGD) WITH PROPOFOL;  Surgeon: Rogene Houston, MD;  Location: [facility];  Service: Endoscopy;  Laterality: N/A;  . Esophageal dilation N/A 09/16/2014    Procedure: ESOPHAGEAL DILATION WITH 54FR MALONEY DILATOR;  Surgeon: Rogene Houston, MD;  Location: [facility];  Service: Endoscopy;  Laterality: N/A;  . Esophageal biopsy  09/16/2014    Procedure: BIOPSY;  Surgeon: Rogene Houston, MD;  Location: [facility];  Service: Endoscopy;;   Family History  Problem Relation Age of Onset  . High  Cholesterol Mother   . High blood pressure Mother   . Diabetes Father    Social History  Substance Use Topics  . Smoking status: Former Smoker -- 1.50 packs/day for 15 years    Types: Cigarettes    Quit date: 05/22/2006  . Smokeless tobacco: Never Used  . Alcohol Use: No   OB History    No data available     Review of Systems  Constitutional: Negative for fever.  Musculoskeletal: Positive for arthralgias. Negative for myalgias and joint swelling.  Neurological: Negative for weakness and numbness.      Allergies  Codeine and Lamictal  Home Medications   Prior to Admission medications   Medication Sig Start Date End Date Taking? Authorizing Provider  Calcium Carb-Cholecalciferol (CALCIUM 500 +D) 500-400 MG-UNIT TABS Take 1 tablet by mouth 2 (two) times daily.   Yes Historical Provider, MD  Calcium Carbonate Antacid (TUMS PO) Take 1 tablet by mouth as needed (heartburn).    Yes Historical Provider, MD  dexlansoprazole (DEXILANT) 60 MG capsule Take one capsule by mouth 30 minutes before breakfast daily 03/07/14  Yes Rogene Houston, MD  divalproex (DEPAKOTE ER) 500 MG 24 hr tablet Take 1 tablet (500 mg total) by mouth 2 (two) times daily. 04/21/14  Yes Dennie Bible, NP  estradiol (VIVELLE-DOT) 0.075 MG/24HR Place 1 patch onto the skin 2 (two)  times a week. On Wednesday and Saturday   Yes Historical Provider, MD  HYDROcodone-acetaminophen (NORCO/VICODIN) 5-325 MG per tablet Take 1 tablet by mouth every 4 (four) hours as needed. Take this medication with food 10/30/14  Yes Lily Kocher, PA-C  lacosamide (VIMPAT) 200 MG TABS tablet Take 1 tablet (200 mg total) by mouth 2 (two) times daily. 12/14/14  Yes Kathrynn Ducking, MD  latanoprost (XALATAN) 0.005 % ophthalmic solution Place 1 drop into both eyes at bedtime.   Yes Historical Provider, MD  levocetirizine (XYZAL) 5 MG tablet Take 5 mg by mouth every evening.    Yes Historical Provider, MD  oxybutynin (DITROPAN-XL) 5 MG 24 hr  tablet Take 5 mg by mouth daily.   Yes Historical Provider, MD  Pediatric Multiple Vit-C-FA (PEDIATRIC MULTIVITAMIN) chewable tablet Chew 1 tablet by mouth daily.     Yes Historical Provider, MD  ibuprofen (ADVIL,MOTRIN) 600 MG tablet Take 1 tablet (600 mg total) by mouth every 6 (six) hours as needed for moderate pain. 01/15/15   Evalee Jefferson, PA-C   BP 126/91 mmHg  Pulse 64  Temp(Src) 97.4 F (36.3 C) (Oral)  Resp 18  Ht 5\' 6"  (1.676 m)  Wt 150 lb (68.04 kg)  BMI 24.22 kg/m2  SpO2 100% Physical Exam  Constitutional: She appears well-developed and well-nourished.  HENT:  Head: Atraumatic.  Neck: Normal range of motion.  Cardiovascular:  Pulses equal bilaterally  Musculoskeletal: She exhibits tenderness.       Right knee: She exhibits ecchymosis and bony tenderness. She exhibits normal range of motion, no swelling, no effusion, normal alignment, no LCL laxity and no MCL laxity.       Left knee: She exhibits ecchymosis and bony tenderness. She exhibits normal range of motion, no swelling, no effusion, normal alignment, no LCL laxity and no MCL laxity. Tenderness found.  Small bilateral patellar bruises.   Neurological: She is alert. She has normal strength. She displays normal reflexes. No sensory deficit.  Skin: Skin is warm and dry.  Psychiatric: She has a normal mood and affect.    ED Course  Procedures (including critical care time) Labs Review Labs Reviewed  VALPROIC ACID LEVEL - Abnormal; Notable for the following:    Valproic Acid Lvl 116 (*)    All other components within normal limits    Results for orders placed or performed during the hospital encounter of 01/15/15  Valproic acid level  Result Value Ref Range   Valproic Acid Lvl 116 (H) 50.0 - 100.0 ug/mL   Dg Knee Complete 4 Views Left  01/15/2015   CLINICAL DATA:  Acute left knee pain after fall today. Initial encounter.  EXAM: LEFT KNEE - COMPLETE 4+ VIEW  COMPARISON:  April 07, 2013.  FINDINGS: There is no  evidence of fracture, dislocation, or joint effusion. Stable mild narrowing of medial joint space is noted. Soft tissues are unremarkable.  IMPRESSION: Mild degenerative joint disease is noted medially. No acute abnormality seen in the left knee.   Electronically Signed   By: Marijo Conception, M.D.   On: 01/15/2015 09:51   Dg Knee Complete 4 Views Right  01/15/2015   CLINICAL DATA:  Acute right knee pain after fall. Initial encounter.  EXAM: RIGHT KNEE - COMPLETE 4+ VIEW  COMPARISON:  None.  FINDINGS: There is no evidence of fracture, dislocation, or joint effusion. Mild narrowing of medial joint space is noted. Soft tissues are unremarkable.  IMPRESSION: Mild degenerative joint disease is noted medially. No acute  abnormality seen in the right knee.   Electronically Signed   By: Marijo Conception, M.D.   On: 01/15/2015 09:53        MDM   Final diagnoses:  Knee contusion, unspecified laterality, initial encounter     Radiological studies were viewed, interpreted and considered during the medical decision making and disposition process. I agree with radiologists reading.  Results were also discussed with patient.    Advised ice,  Activity as tolerated. Ibuprofen.  Pt ambulatory while in ed.  The patient appears reasonably screened and/or stabilized for discharge and I doubt any other medical condition or other [facility] requiring further screening, evaluation, or treatment in the ED at this time prior to discharge.   Evalee Jefferson, PA-C 01/17/15 1358  Milton Ferguson, MD 01/19/15 1020

## 2015-01-24 DIAGNOSIS — G40909 Epilepsy, unspecified, not intractable, without status epilepticus: Secondary | ICD-10-CM | POA: Diagnosis not present

## 2015-01-24 DIAGNOSIS — Z6823 Body mass index (BMI) 23.0-23.9, adult: Secondary | ICD-10-CM | POA: Diagnosis not present

## 2015-01-24 DIAGNOSIS — F70 Mild intellectual disabilities: Secondary | ICD-10-CM | POA: Diagnosis not present

## 2015-02-21 DIAGNOSIS — H401331 Pigmentary glaucoma, bilateral, mild stage: Secondary | ICD-10-CM | POA: Diagnosis not present

## 2015-03-01 ENCOUNTER — Encounter (INDEPENDENT_AMBULATORY_CARE_PROVIDER_SITE_OTHER): Payer: Self-pay | Admitting: *Deleted

## 2015-03-11 ENCOUNTER — Other Ambulatory Visit (INDEPENDENT_AMBULATORY_CARE_PROVIDER_SITE_OTHER): Payer: Self-pay | Admitting: Internal Medicine

## 2015-03-22 DIAGNOSIS — Z6823 Body mass index (BMI) 23.0-23.9, adult: Secondary | ICD-10-CM | POA: Diagnosis not present

## 2015-03-22 DIAGNOSIS — R109 Unspecified abdominal pain: Secondary | ICD-10-CM | POA: Diagnosis not present

## 2015-03-22 DIAGNOSIS — R110 Nausea: Secondary | ICD-10-CM | POA: Diagnosis not present

## 2015-03-22 DIAGNOSIS — Z1389 Encounter for screening for other disorder: Secondary | ICD-10-CM | POA: Diagnosis not present

## 2015-03-22 DIAGNOSIS — R42 Dizziness and giddiness: Secondary | ICD-10-CM | POA: Diagnosis not present

## 2015-04-11 DIAGNOSIS — H21233 Degeneration of iris (pigmentary), bilateral: Secondary | ICD-10-CM | POA: Diagnosis not present

## 2015-04-11 DIAGNOSIS — H40013 Open angle with borderline findings, low risk, bilateral: Secondary | ICD-10-CM | POA: Diagnosis not present

## 2015-04-18 DIAGNOSIS — M25562 Pain in left knee: Secondary | ICD-10-CM | POA: Diagnosis not present

## 2015-04-18 DIAGNOSIS — Z6823 Body mass index (BMI) 23.0-23.9, adult: Secondary | ICD-10-CM | POA: Diagnosis not present

## 2015-04-21 DIAGNOSIS — M25562 Pain in left knee: Secondary | ICD-10-CM | POA: Diagnosis not present

## 2015-04-21 DIAGNOSIS — Z6823 Body mass index (BMI) 23.0-23.9, adult: Secondary | ICD-10-CM | POA: Diagnosis not present

## 2015-05-09 ENCOUNTER — Ambulatory Visit (INDEPENDENT_AMBULATORY_CARE_PROVIDER_SITE_OTHER): Payer: Medicare Other | Admitting: Internal Medicine

## 2015-05-12 DIAGNOSIS — Z1231 Encounter for screening mammogram for malignant neoplasm of breast: Secondary | ICD-10-CM | POA: Diagnosis not present

## 2015-05-18 ENCOUNTER — Other Ambulatory Visit (HOSPITAL_COMMUNITY): Payer: Self-pay

## 2015-05-26 ENCOUNTER — Emergency Department (HOSPITAL_COMMUNITY): Payer: Medicare Other

## 2015-05-26 ENCOUNTER — Emergency Department (HOSPITAL_COMMUNITY)
Admission: EM | Admit: 2015-05-26 | Discharge: 2015-05-26 | Disposition: A | Discharge status: Home / Self Care | Payer: Medicare Other | Attending: Emergency Medicine | Admitting: Emergency Medicine

## 2015-05-26 ENCOUNTER — Encounter (HOSPITAL_COMMUNITY): Payer: Self-pay | Admitting: Emergency Medicine

## 2015-05-26 DIAGNOSIS — F79 Unspecified intellectual disabilities: Secondary | ICD-10-CM | POA: Diagnosis not present

## 2015-05-26 DIAGNOSIS — K5900 Constipation, unspecified: Secondary | ICD-10-CM | POA: Diagnosis not present

## 2015-05-26 DIAGNOSIS — G40909 Epilepsy, unspecified, not intractable, without status epilepticus: Secondary | ICD-10-CM | POA: Insufficient documentation

## 2015-05-26 DIAGNOSIS — R112 Nausea with vomiting, unspecified: Secondary | ICD-10-CM | POA: Insufficient documentation

## 2015-05-26 DIAGNOSIS — K219 Gastro-esophageal reflux disease without esophagitis: Secondary | ICD-10-CM | POA: Insufficient documentation

## 2015-05-26 DIAGNOSIS — Z87891 Personal history of nicotine dependence: Secondary | ICD-10-CM | POA: Diagnosis not present

## 2015-05-26 DIAGNOSIS — R079 Chest pain, unspecified: Secondary | ICD-10-CM | POA: Diagnosis not present

## 2015-05-26 DIAGNOSIS — Z79899 Other long term (current) drug therapy: Secondary | ICD-10-CM | POA: Diagnosis not present

## 2015-05-26 DIAGNOSIS — R1031 Right lower quadrant pain: Secondary | ICD-10-CM | POA: Diagnosis not present

## 2015-05-26 DIAGNOSIS — R109 Unspecified abdominal pain: Secondary | ICD-10-CM | POA: Diagnosis present

## 2015-05-26 HISTORY — DX: Dysphagia, unspecified: R13.10

## 2015-05-26 HISTORY — DX: Constipation, unspecified: K59.00

## 2015-05-26 HISTORY — DX: Thrombocytopenia, unspecified: D69.6

## 2015-05-26 LAB — HEPATIC FUNCTION PANEL
ALT: 28 U/L (ref 14–54)
AST: 27 U/L (ref 15–41)
Albumin: 4.3 g/dL (ref 3.5–5.0)
Alkaline Phosphatase: 65 U/L (ref 38–126)
Bilirubin, Direct: 0.1 mg/dL (ref 0.1–0.5)
Indirect Bilirubin: 0.6 mg/dL (ref 0.3–0.9)
TOTAL PROTEIN: 7.2 g/dL (ref 6.5–8.1)
Total Bilirubin: 0.7 mg/dL (ref 0.3–1.2)

## 2015-05-26 LAB — CBC
HCT: 42.1 % (ref 36.0–46.0)
Hemoglobin: 14.1 g/dL (ref 12.0–15.0)
MCH: 31.8 pg (ref 26.0–34.0)
MCHC: 33.5 g/dL (ref 30.0–36.0)
MCV: 95 fL (ref 78.0–100.0)
Platelets: 129 10*3/uL — ABNORMAL LOW (ref 150–400)
RBC: 4.43 MIL/uL (ref 3.87–5.11)
RDW: 13.5 % (ref 11.5–15.5)
WBC: 11.8 10*3/uL — AB (ref 4.0–10.5)

## 2015-05-26 LAB — URINE MICROSCOPIC-ADD ON

## 2015-05-26 LAB — BASIC METABOLIC PANEL
ANION GAP: 10 (ref 5–15)
BUN: 21 mg/dL — ABNORMAL HIGH (ref 6–20)
CO2: 27 mmol/L (ref 22–32)
Calcium: 9.6 mg/dL (ref 8.9–10.3)
Chloride: 104 mmol/L (ref 101–111)
Creatinine, Ser: 0.87 mg/dL (ref 0.44–1.00)
GFR calc Af Amer: >60 mL/min (ref >60)
GFR calc non Af Amer: >60 mL/min (ref >60)
Glucose, Bld: 128 mg/dL — ABNORMAL HIGH (ref 65–99)
Potassium: 4.6 mmol/L (ref 3.5–5.1)
Sodium: 141 mmol/L (ref 135–145)

## 2015-05-26 LAB — URINALYSIS, ROUTINE W REFLEX MICROSCOPIC
Bilirubin Urine: NEGATIVE
Glucose, UA: NEGATIVE mg/dL
KETONES UR: 15 mg/dL — AB
LEUKOCYTES UA: NEGATIVE
NITRITE: NEGATIVE
Protein, ur: NEGATIVE mg/dL
SPECIFIC GRAVITY, URINE: 1.015 (ref 1.005–1.030)
pH: 7 (ref 5.0–8.0)

## 2015-05-26 LAB — VALPROIC ACID LEVEL: VALPROIC ACID LVL: 102 ug/mL — AB (ref 50.0–100.0)

## 2015-05-26 LAB — TROPONIN I

## 2015-05-26 LAB — LIPASE, BLOOD: Lipase: 27 U/L (ref 11–51)

## 2015-05-26 MED ORDER — ONDANSETRON 8 MG PO TBDP: 8.0000 mg | ORAL_TABLET | Freq: Three times a day (TID) | ORAL | Status: DC | PRN

## 2015-05-26 MED ORDER — SODIUM CHLORIDE 0.9 % IV SOLN
INTRAVENOUS | Status: DC
  Administered 2015-05-26: 16:00:00 via INTRAVENOUS

## 2015-05-26 MED ORDER — IOHEXOL 300 MG/ML  SOLN
100.0000 mL | Freq: Once | INTRAMUSCULAR | Status: AC | PRN
  Administered 2015-05-26: 100 mL via INTRAVENOUS

## 2015-05-26 MED ORDER — LORAZEPAM 2 MG/ML IJ SOLN: 1.0000 mg | Freq: Once | INTRAMUSCULAR | Status: DC

## 2015-05-26 MED ORDER — IOHEXOL 300 MG/ML  SOLN
25.0000 mL | Freq: Once | INTRAMUSCULAR | Status: AC | PRN
Start: 2015-05-26 — End: 2015-05-26
  Administered 2015-05-26: 25 mL via ORAL

## 2015-05-26 MED ORDER — ONDANSETRON HCL 4 MG/2ML IJ SOLN
4.0000 mg | INTRAMUSCULAR | Status: DC | PRN
  Administered 2015-05-26: 4 mg via INTRAVENOUS
  Filled 2015-05-26: qty 2

## 2015-05-26 MED ORDER — DIVALPROEX SODIUM 250 MG PO DR TAB
500.0000 mg | DELAYED_RELEASE_TABLET | Freq: Once | ORAL | Status: AC
  Administered 2015-05-26: 500 mg via ORAL
  Filled 2015-05-26: qty 2

## 2015-05-26 MED ORDER — LORAZEPAM 2 MG/ML IJ SOLN
0.5000 mg | Freq: Once | INTRAMUSCULAR | Status: AC
  Administered 2015-05-26: 0.5 mg via INTRAVENOUS
  Filled 2015-05-26: qty 1

## 2015-05-26 MED ORDER — FAMOTIDINE IN NACL 20-0.9 MG/50ML-% IV SOLN
20.0000 mg | Freq: Once | INTRAVENOUS | Status: AC
  Administered 2015-05-26: 20 mg via INTRAVENOUS
  Filled 2015-05-26: qty 50

## 2015-05-26 NOTE — ED Notes (Signed)
Per NT, pt voided around bedpan. No urine sample obtained at this time.

## 2015-05-26 NOTE — ED Notes (Signed)
Pt sent over from urgent care for sudden onset of vomiting that began this morning, RLQ abdominal pain with rebound tenderness. Family reports emesis was green.

## 2015-05-26 NOTE — ED Provider Notes (Signed)
CSN: [id number]     Arrival date & time 05/26/15  1356 History   First MD Initiated Contact with Patient 05/26/15 1512     Chief Complaint  Patient presents with  . Abdominal Pain      Patient is a 54 y.o. female presenting with abdominal pain. The history is provided by a caregiver, a relative and the patient. The history is limited by a developmental delay (Hx MR).  Abdominal Pain Pt was seen at 1525. Per pt's caregiver and pt: Pt c/o abd pain since this morning. Has been associated with several intermittent episodes of "green" emesis. Pt was evaluated at [facility], then instructed to come to the ED for further evaluation. No reported fevers, no diarrhea, no black or blood in stools or emesis. Pt has significant hx of MR.     Past Medical History  Diagnosis Date  . GERD (gastroesophageal reflux disease)   . Mental retardation     lesion in head  . Dysphagia   . Constipation   . Seizures ([place])     "not fully controlled on max doses of meds" (Neuro ofc note 12/2014)  . Thrombocytopenia [facility])    Past Surgical History  Procedure Laterality Date  . Total abdominal hysterectomy    . Tonsillectomy and adenoidectomy    . Skin graft to gum Right 08/2013  . Cataract extraction      both eyes, May of 2015  . Mouth surgery    . Esophagogastroduodenoscopy (egd) with propofol N/A 09/16/2014    Procedure: ESOPHAGOGASTRODUODENOSCOPY (EGD) WITH PROPOFOL;  Surgeon: Rogene Houston, MD;  Location: [facility];  Service: Endoscopy;  Laterality: N/A;  . Esophageal dilation N/A 09/16/2014    Procedure: ESOPHAGEAL DILATION WITH 54FR MALONEY DILATOR;  Surgeon: Rogene Houston, MD;  Location: [facility];  Service: Endoscopy;  Laterality: N/A;  . Esophageal biopsy  09/16/2014    Procedure: BIOPSY;  Surgeon: Rogene Houston, MD;  Location: [facility];  Service: Endoscopy;;   Family History  Problem Relation Age of Onset  . High Cholesterol Mother   . High blood pressure Mother   . Diabetes Father    Social  History  Substance Use Topics  . Smoking status: Former Smoker -- 1.50 packs/day for 15 years    Types: Cigarettes    Quit date: 05/22/2006  . Smokeless tobacco: Never Used  . Alcohol Use: No    Review of Systems  Unable to perform ROS: Other  Gastrointestinal: Positive for abdominal pain.    Allergies  Codeine and Lamictal  Home Medications   Prior to Admission medications   Medication Sig Start Date End Date Taking? Authorizing Provider  Calcium Carb-Cholecalciferol (CALCIUM 500 +D) 500-400 MG-UNIT TABS Take 1 tablet by mouth 2 (two) times daily.   Yes Historical Provider, MD  Calcium Carbonate Antacid (TUMS PO) Take 1 tablet by mouth as needed (heartburn).    Yes Historical Provider, MD  cholecalciferol (VITAMIN D) 1000 UNITS tablet Take 1,000 Units by mouth daily.   Yes Historical Provider, MD  DEXILANT 60 MG capsule TAKE 1 CAPSULE 30 MINUTES BEFORE BREAKFAST DAILY 03/13/15  Yes Rogene Houston, MD  divalproex (DEPAKOTE ER) 500 MG 24 hr tablet Take 1 tablet (500 mg total) by mouth 2 (two) times daily. 04/21/14  Yes Dennie Bible, NP  estradiol (VIVELLE-DOT) 0.075 MG/24HR Place 1 patch onto the skin 2 (two) times a week. On Wednesday and Saturday   Yes Historical Provider, MD  Fish Oil-Cholecalciferol (FISH OIL +  D3 PO) Take 2 capsules by mouth daily.   Yes Historical Provider, MD  HYDROcodone-acetaminophen (NORCO/VICODIN) 5-325 MG per tablet Take 1 tablet by mouth every 4 (four) hours as needed. Take this medication with food 10/30/14  Yes Lily Kocher, PA-C  ibuprofen (ADVIL,MOTRIN) 600 MG tablet Take 1 tablet (600 mg total) by mouth every 6 (six) hours as needed for moderate pain. 01/15/15  Yes Evalee Jefferson, PA-C  lacosamide (VIMPAT) 200 MG TABS tablet Take 1 tablet (200 mg total) by mouth 2 (two) times daily. 12/14/14  Yes Kathrynn Ducking, MD  latanoprost (XALATAN) 0.005 % ophthalmic solution Place 1 drop into both eyes at bedtime.   Yes Historical Provider, MD   levocetirizine (XYZAL) 5 MG tablet Take 5 mg by mouth every evening.    Yes Historical Provider, MD  ondansetron (ZOFRAN) 8 MG tablet Take 8 mg by mouth every 8 (eight) hours as needed for nausea or vomiting.   Yes Historical Provider, MD  oxybutynin (DITROPAN-XL) 5 MG 24 hr tablet Take 5 mg by mouth daily.   Yes Historical Provider, MD  Pediatric Multiple Vit-C-FA (PEDIATRIC MULTIVITAMIN) chewable tablet Chew 1 tablet by mouth daily.     Yes Historical Provider, MD   BP 129/71 mmHg  Pulse 86  Temp(Src) 97.9 F (36.6 C) (Oral)  Resp 17  Ht 5\' 6"  (1.676 m)  Wt 146 lb (66.225 kg)  BMI 23.58 kg/m2  SpO2 96% Physical Exam  1530: Physical examination:  Nursing notes reviewed; Vital signs and O2 SAT reviewed;  Constitutional: Well developed, Well nourished, Well hydrated, In no acute distress; Head:  Normocephalic, atraumatic; Eyes: EOMI, PERRL, No scleral icterus; ENMT: Mouth and pharynx normal, Mucous membranes moist; Neck: Supple, Full range of motion, No lymphadenopathy; Cardiovascular: Regular rate and rhythm, No gallop; Respiratory: Breath sounds clear & equal bilaterally, No wheezes.  Speaking full sentences with ease, Normal respiratory effort/excursion; Chest: Nontender, Movement normal; Abdomen: Soft, +mild diffuse tenderness to palp. No rebound or guarding. Nondistended, Normal bowel sounds; Genitourinary: No CVA tenderness; Extremities: Pulses normal, No tenderness, No edema, No calf edema or asymmetry.; Neuro: Awake, alert, vague historian per hx MR. Major CN grossly intact. No facial droop. Speech clear. No gross focal motor or sensory deficits in extremities. Climbs on and off stretcher easily by herself. Gait steady.; Skin: Color normal, Warm, Dry.; Psych:  Affect flat, poor eye contact.    ED Course  Procedures (including critical care time) Labs Review   Imaging Review  I have personally reviewed and evaluated these images and lab results as part of my medical  decision-making.   EKG Interpretation   Date/Time:  Friday May 26 2015 16:10:13 EST Ventricular Rate:  81 PR Interval:  124 QRS Duration: 133 QT Interval:  412 QTC Calculation: 478 R Axis:   80 Text Interpretation:  Sinus rhythm Right bundle branch block When compared  with ECG of 09/14/2014 No significant change was found Confirmed by [facility]   MD, Nunzio Cory [phone]) on 05/26/2015 4:21:03 PM      MDM  MDM Reviewed: previous chart, nursing note and vitals Reviewed previous: labs Interpretation: labs, x-ray and CT scan   Results for orders placed or performed during the hospital encounter of 05/26/15  Lipase, blood  Result Value Ref Range   Lipase 27 11 - 51 U/L  CBC  Result Value Ref Range   WBC 11.8 (H) 4.0 - 10.5 K/uL   RBC 4.43 3.87 - 5.11 MIL/uL   Hemoglobin 14.1 12.0 - 15.0 g/dL  HCT 42.1 36.0 - 46.0 %   MCV 95.0 78.0 - 100.0 fL   MCH 31.8 26.0 - 34.0 pg   MCHC 33.5 30.0 - 36.0 g/dL   RDW 13.5 11.5 - 15.5 %   Platelets 129 (L) 150 - 400 K/uL  Basic metabolic panel  Result Value Ref Range   Sodium 141 135 - 145 mmol/L   Potassium 4.6 3.5 - 5.1 mmol/L   Chloride 104 101 - 111 mmol/L   CO2 27 22 - 32 mmol/L   Glucose, Bld 128 (H) 65 - 99 mg/dL   BUN 21 (H) 6 - 20 mg/dL   Creatinine, Ser 0.87 0.44 - 1.00 mg/dL   Calcium 9.6 8.9 - 10.3 mg/dL   GFR calc non Af Amer >60 >60 mL/min   GFR calc Af Amer >60 >60 mL/min   Anion gap 10 5 - 15  Hepatic function panel  Result Value Ref Range   Total Protein 7.2 6.5 - 8.1 g/dL   Albumin 4.3 3.5 - 5.0 g/dL   AST 27 15 - 41 U/L   ALT 28 14 - 54 U/L   Alkaline Phosphatase 65 38 - 126 U/L   Total Bilirubin 0.7 0.3 - 1.2 mg/dL   Bilirubin, Direct 0.1 0.1 - 0.5 mg/dL   Indirect Bilirubin 0.6 0.3 - 0.9 mg/dL  Valproic acid level  Result Value Ref Range   Valproic Acid Lvl 102 (H) 50.0 - 100.0 ug/mL   Dg Chest 2 View 05/26/2015  CLINICAL DATA:  Vomiting, chest pain, right lower quadrant pain starting this morning  EXAM: CHEST  2 VIEW COMPARISON:  10/26/2014 FINDINGS: Cardiomediastinal silhouette is stable. No acute infiltrate or pleural effusion. No pulmonary edema. Bony thorax is unremarkable. IMPRESSION: No active cardiopulmonary disease. Electronically Signed   By: Lahoma Crocker M.D.   On: 05/26/2015 17:00   Ct Abdomen Pelvis W Contrast 05/26/2015  CLINICAL DATA:  54 year old female with right lower quadrant abdominal pain and rebound tenderness. EXAM: CT ABDOMEN AND PELVIS WITH CONTRAST TECHNIQUE: Multidetector CT imaging of the abdomen and pelvis was performed using the standard protocol following bolus administration of intravenous contrast. CONTRAST:  167mL OMNIPAQUE IOHEXOL 300 MG/ML SOLN, 47mL OMNIPAQUE IOHEXOL 300 MG/ML SOLN COMPARISON:  CT dated 12/15/2012 and ultrasound dated 03/16/2014 FINDINGS: Minimal bibasilar linear atelectatic changes. No intra-abdominal free air or free fluid. Subcentimeter right hepatic hypodense lesion as well as a 1 cm hypodense lesion in the lateral segment of the left lobe of the liver adjacent to the falciform ligament appear stable compared to the study dating back to 06/28/2008. The gallbladder, pancreas, spleen, adrenal glands appear unremarkable. There is moderate left renal atrophy with compensatory hypertrophy of the right kidney. The visualized ureters and urinary bladder appear unremarkable. Hysterectomy. The ovaries are grossly unremarkable. Constipation. Stop there is no evidence of bowel obstruction or inflammation. Normal appendix. There is aortoiliac atherosclerotic disease. No portal venous gas identified. The IVC is patent. There is no adenopathy. There is a small fat containing umbilical hernia. Mild degenerative changes of the spine. No acute fracture. IMPRESSION: No acute intra-abdominal pelvic or pathology. Constipation. No bowel obstruction. Normal appendix. Electronically Signed   By: Anner Crete M.D.   On: 05/26/2015 19:34     1640:  Staff was called  into room by family, stating pt was "having a grand mal seizure!" First ED RN into exam room states pt was sitting up on stretcher with her eyes open awake/alert, no excessive blinking, no shaking, no incont of  bowel/bladder, no AMS (see her note). VS remained stable on monitor. On my arrival to exam room, pt was now laying semi-fowlers, awake/alert, resps easy, NAD. No lethargy, no post-ictal state. I asked pt's family to describe to me what they meant by "grand mal seizure." Family states pt "sat herself up" and "shook her hands and feet" then "laid back down," but pt's body and head did not "shake."  I then asked them if that was her usual "seizure." Family stated: "well, no, they're usually really bad" and "she usually takes her clothes off" but "she always has a seizure if she misses even one dose of her depakote" and "she needs some by her IV." Family informed of reassuring depakote level. Doubt seizure at this time. Will dose IV ativan.   1950:  Workup reassuring; results d/w pt and family. Pt had N/V after PO challenge, will remedicate.   2155:  Pt has been given her nightly dose of depakote and vimpat. Pt has tol PO well without N/V. Pt has ambulated with steady gait, easy resps, NAD. Pt's brother states they are ready to go home now. Dx and testing d/w pt and family.  Questions answered.  Verb understanding, agreeable to d/c home with outpt f/u.   Francine Graven, DO 05/28/15 1231

## 2015-05-26 NOTE — ED Notes (Signed)
Pt on bedpan attempting to obtain urine sample.

## 2015-05-26 NOTE — ED Notes (Addendum)
Pt drinking PO contrast. nad noted.

## 2015-05-26 NOTE — Discharge Instructions (Signed)
°Emergency Department Resource Guide °1) Find a Doctor and Pay Out of Pocket °Although you won't have to find out who is covered by your insurance plan, it is a good idea to ask around and get recommendations. You will then need to call the office and see if the doctor you have chosen will accept you as a new patient and what types of options they offer for patients who are self-pay. Some doctors offer discounts or will set up payment plans for their patients who do not have insurance, but you will need to ask so you aren't surprised when you get to your appointment. ° °2) Contact Your Local Health Department °Not all health departments have doctors that can see patients for sick visits, but many do, so it is worth a call to see if yours does. If you don't know where your local health department is, you can check in your phone book. The CDC also has a tool to help you locate your state's health department, and many state websites also have listings of all of their local health departments. ° °3) Find a Walk-in Clinic °If your illness is not likely to be very severe or complicated, you may want to try a walk in clinic. These are popping up all over the country in pharmacies, drugstores, and shopping centers. They're usually staffed by nurse practitioners or physician assistants that have been trained to treat common illnesses and complaints. They're usually fairly quick and inexpensive. However, if you have serious medical issues or chronic medical problems, these are probably not your best option. ° °No Primary Care Doctor: °- Call [facility] at  832-8000 - they can help you locate a primary care doctor that  accepts your insurance, provides certain services, etc. °- Physician Referral Service- 1-800-533-3463 ° °Chronic Pain Problems: °Organization         Address  Phone   Notes  ° [facility]  (336) 297-2271 Patients need to be referred by their primary care doctor.  ° °Medication  Assistance: °Organization         Address  Phone   Notes  °[place] [facility] [address] °[place], [place] [address] (336) 641-8030 --Must be a resident of [place] °-- Must have NO insurance coverage whatsoever (no Medicaid/ Medicare, etc.) °-- The pt. MUST have a primary care doctor that directs their care regularly and follows them in the community °  °[facility]  (866) 331-1348   °[employer]  (888) 892-1162   ° °Agencies that provide inexpensive medical care: °Organization         Address  Phone   Notes  °[facility]  (336) 832-8035   °[facility]    (336) 832-7272   °[facility] [address] °[place], [place] [address] (336) 832-4777   °[facility] [address], °[place] (336) 271-4999   °[facility]    (336) 373-0678   °[facility]    (336) 272-1050   °[facility] ° [address], [place] Phone:  (336) 832-4444, Fax:  (336) 832-4440 Hours of Operation:  9 am - 6 pm, M-F.  Also accepts Medicaid/Medicare and self-pay.  °[facility] ° [address], [place] Phone: (336) 832-3150, Fax: (336) [phone]. Hours of Operation:  8:30 am - 5:30 pm, M-F.  Also accepts Medicaid and self-pay.  °[facility] [place] [address], [place] Phone: (336) 878-6027   °[facility] [address], [place], [place] (336)723-1848, Ext. 123 Mondays & Thursdays: 7-9 AM.  First 15 patients are seen on a first come, first serve basis. °  ° °Medicaid-accepting [place] Providers: ° °Organization         Address  Phone   Notes  °[facility] [address], [place] (336) 641-2100 Also accepts self-pay patients.  °[facility] [address], [place] ° (336) 856-9996   °[facility] [address], [place]  (336) 288-8857   °[facility] [address] [place] [address], [place] (336) 299-7000   °Veita Bland [address], [place]  ° (336) 373-1557 Only accepts [place] Access Medicaid patients after they have their name applied to their card.  ° °Self-Pay (no insurance) in [place]: ° °Organization         Address  Phone   Notes  °Sickle Cell Patients, [facility] [address], [place] (336) 832-1970   °[facility] [address], [place] (336) 832-4400   °[facility] [place] ° [address], [place] (336) 992-4800   °[facility]/Dr. Osei-Bonsu ° [address] [place] [address], [place] or [address], [place] (336) 841-8500 Phone number for both [place] and [place] locations is the same.  °[facility] [address], [place] (336) 299-0000   °[facility] [address] [place] [address], [place] or [address] (336) 852-7530 °(336) 878-2260   °[facility] [address], [place] (336) 350-1642, phone; (336) 294-5005, fax Sees patients 1st and 3rd Saturday of every month.  Must not qualify for public or private insurance (i.e. Medicaid, Medicare, [place] Health Choice, Veterans' Benefits) • Household income should be no more than 200% of the poverty level •The clinic cannot treat you if you are pregnant or think you are pregnant • Sexually transmitted diseases are not treated at the clinic.  ° ° °Dental Care: °Organization         Address  Phone  Notes  °[place] [facility] [address], [place] (336) 641-6152 Accepts children up to age 21 who are enrolled in Medicaid or [place] Health Choice; pregnant women with a Medicaid card; and children who have applied for Medicaid or [place] Health Choice, but were declined, whose parents can pay a reduced fee at time of service.  °[place]  [facility] [place]  [address], [place] (336) 641-7733 Accepts children up to age 21 who are enrolled in Medicaid or  Health Choice; pregnant women with a Medicaid card; and children who have applied for Medicaid or  Health Choice, but were declined, whose parents can pay a reduced fee at time of service.  °[facility] ° [address],  (336) 641-4533 Patients are seen by appointment only. Walk-ins are not accepted. [facility] will see patients 18 years of age and older. °Monday - Tuesday (8am-5pm) °Most Wednesdays (8:30-5pm) °$30 per visit, cash only  °[facility] ° [address], [place] (336) 641-4533 Patients are seen by appointment only. Walk-ins are not accepted. [facility] will see patients 18 years of age and older. °One   Wednesday Evening (Monthly: Volunteer Based).  $30 per visit, cash only  °[facility]  (919) 537-3737 for adults; Children under age 4, call Graduate Pediatric Dentistry at ([phone]) [phone]. Children aged 4-14, please call ([phone]) [phone] to request a pediatric application. ° Dental services are provided in all areas of dental care including fillings, crowns and bridges, complete and partial dentures, implants, gum treatment, root canals, and extractions. Preventive care is also provided. Treatment is provided to both adults and children. °Patients are selected via a lottery and there is often a waiting list. °  °[facility] [address], °[place] ° (336) 763-8833 [email or web address] °  °[facility] [address], [place], [place] (336)723-1848, Ext. 123 Second and Fourth Thursday of each month, opens at 6:30 AM; Clinic ends at 9 AM.  Patients are seen on a first-come first-served basis, and a limited number are seen during each clinic.  ° °[facility] ° [address], [place], [place] (336) 723-7904    Eligibility Requirements °You must have lived in [place], or [place] counties for at least the last three months. °  You cannot be eligible for state or federal sponsored healthcare insurance, including Veterans Administration, Medicaid, or Medicare. °  You generally cannot be eligible for healthcare insurance through your employer.  °  How to apply: °Eligibility screenings are held every Tuesday and Wednesday afternoon from 1:00 pm until 4:00 pm. You do not need an appointment for the interview!  °[facility] [address], [place], [place] 336-631-2330   °[facility]  336-342-8273   °[facility]  336-703-3100   °[facility]  336-570-6415   ° °Behavioral Health Resources in the Community: °Intensive Outpatient Programs °Organization         Address  Phone  Notes  °[facility] [address], [place], [place] 336-878-6098   °[facility] [address], [place], [place] 336-832-9800   °[facility] [address], [place], [place] ° 336-882-2125   °[facility] [address],  °[place], [place] 1-800-853-5163 or 336-641-4981   °Substance Abuse Resources °Organization         Address  Phone  Notes  °[facility]  336-882-2125   °[facility]  336-784-9470   °[facility]  336-285-9073   °[facility]  336-845-3988   °Residential & Outpatient Substance Abuse Program  1-800-659-3381   °Psychological Services °Organization         Address  Phone  Notes  °[facility]  336- 832-9600   °[facility]  336- 378-7881   °[facility] [address], [place] 1-800-853-5163 or 336-641-4981   ° °Mobile Crisis Teams °Organization         Address  Phone  Notes  °[facility], Mobile Crisis Care Unit  1-877-626-1772   °Assertive °[facility] ° [address]  [place], [place] 336-834-9664   °Sharon DeEsch [address] °[place] 336-554-5454   ° °Self-Help/Support Groups °Organization         Address  Phone             Notes  °[facility] - variety of support groups  336- 373-1402 Call for more information  °Narcotics Anonymous (NA), [facility] [address], °[place]   2 meetings at this location  ° °  Residential Treatment Programs Organization         Address  Phone  Notes  [facility] [address],    [place]  1-346-773-6462   [facility]  [address] [place] [phone], [place], [place]   [place], [place] [phone] Admissions: 8am-3pm M-F  [facility] [place] [address],    [place], [place] X4321937   [facility] [address] [place], [place], [place]   [facility] [address],  [place], [place]   [facility] Intensive Outpatient [address], [place], [place]   [facility] [address]  [place], [place] 1-807 375 6355 or (740)645-9337   [facility] [address] [place], [place] Accepts Medicaid  [facility] [address] [place],  [place] 1-628-472-4009 Substance Abuse/Addiction Treatment   [facility]         Address  Phone  Notes  [facility]  602 223 2187   Domenic Schwab, PhD [address] [place], [place]   2182995704 or 972-777-7212   [place] Vickery [address]ount[address] [place], [place] (970)715-9571   [facility] [address] [place], [place] (346)749-1207 Insurance/Medicaid/sponsorship through [facility] and Families [address]                                    [place], [place] (571) [phone] [place] [address], [place] [phone]    Dr. Adele Schilder  [phone]   [facility] Dept. 1) [address], [place] 2) [place] 3)  [address], [place] [phone] [phone]  ([phone]   [place] 8143865547 or 6413541943 (After Hours)      Take over the counter laxative (such as miralax, milk of magnesia, senokot) AND a dulcolax suppository today and repeat both tomorrow.  Begin to take over the counter stool softener (such as colace or miralax), as directed on packaging, for the next month.  Take the prescription as directed.  Increase your fluid intake (ie:  Gatoraide) for the next few days, as discussed.  Eat a bland diet and advance to your regular diet slowly as you can tolerate it. Call your regular medical doctor Monday to schedule a follow up appointment this week.  Return to the Emergency Department immediately if not improving (or even worsening) despite taking the medicines as prescribed, any black or bloody stool or vomit, if you develop a fever over "101," or for any other concerns.

## 2015-05-26 NOTE — ED Notes (Signed)
Patient ambulated to bathroom with no assistance or difficulty. 

## 2015-05-26 NOTE — ED Notes (Addendum)
Pt reports "i dont have to go anymore." pt removed off bedpan.

## 2015-05-26 NOTE — ED Notes (Signed)
Gave patient ice water as requested for fluid challenge. 

## 2015-05-26 NOTE — ED Notes (Signed)
Pt reported has to void at this time. Bedpan placed.

## 2015-05-26 NOTE — ED Notes (Signed)
Pt reports intermittent midsternal chest pain. Pt placed on cardiac monitor, EKG completed and EDP aware. No new orders given at this time. nad noted.

## 2015-05-26 NOTE — ED Notes (Signed)
This nurse spent approximately 15 minutes trying to speak with the patient's mother who continues to question why the patient is being discharged when she is "still constipated and has not been fixed while she was here"  Informed the mother that this may take some time for the patient to have good results with the large amount of stool that appears to be in her bowel according to the ct scan results.   Pt's mother continued to argue with this nurse that she was unhappy, and had concerns that the patient would be up and down all night needing to use the restroom.  Pt's Brother is here with the family and he has been very helpful and supportive in trying to get the patient home.

## 2015-05-26 NOTE — ED Notes (Signed)
Pt's husband called myself into room for "seizure".  Pt sat herself up in the bed by grabbing onto side railing and pulled self up. Was not blinking and was not shaking.  Husband stated that pt was having a seizure.  Did not resemble seizure activity.  Dr. Thurnell Garbe and primary nurse Oletta Darter. Notified who immediately went to bedside.  Pt had no increase in heartrate on monitor, decrease in O2 sat, or alteration in mental status.

## 2015-05-26 NOTE — ED Notes (Signed)
Pt finishing drinking contrast. CT aware.

## 2015-05-30 ENCOUNTER — Other Ambulatory Visit (HOSPITAL_COMMUNITY): Payer: Self-pay

## 2015-05-30 ENCOUNTER — Encounter (HOSPITAL_COMMUNITY): Payer: Medicare Other

## 2015-05-30 ENCOUNTER — Encounter (HOSPITAL_COMMUNITY): Payer: Self-pay | Admitting: Oncology

## 2015-05-30 ENCOUNTER — Encounter (HOSPITAL_COMMUNITY): Payer: Medicare Other | Attending: Oncology | Admitting: Oncology

## 2015-05-30 ENCOUNTER — Ambulatory Visit (HOSPITAL_COMMUNITY): Payer: Medicare Other | Admitting: Oncology

## 2015-05-30 VITALS — BP 113/66 | HR 71 | Temp 98.2°F | Resp 16 | Wt 141.9 lb

## 2015-05-30 DIAGNOSIS — M8569 Other cyst of bone, multiple sites: Secondary | ICD-10-CM

## 2015-05-30 DIAGNOSIS — D696 Thrombocytopenia, unspecified: Secondary | ICD-10-CM | POA: Diagnosis not present

## 2015-05-30 DIAGNOSIS — M8500 Fibrous dysplasia (monostotic), unspecified site: Secondary | ICD-10-CM

## 2015-05-30 NOTE — Progress Notes (Signed)
Rocky Morel, MD [address] [place] [address]  Thrombocytopenia [facility]) - Plan: CBC with Differential  Bone fibrous dysplasia of skull  CURRENT THERAPY: Observation  INTERVAL HISTORY: Maria [name] 54 y.o. female returns for followup of thrombocytopenia thus far consistent with low-grade idiopathic thrombocytopenic purpura with low-med IgG anticardiolipin antibody and negative platelet antibody testing.  I personally reviewed and went over laboratory results with the patient.  The results are noted within this dictation.  Platelet count is stable without any changes x years.  I have reviewed her most recent labs from 12/23. Anticardilipin antibody testing was low-med positive for IgG.  Chart is reviewed.  ED visit on 05/26/2015 is noted.  She presented to the ED with abdominal pain.  Abdominal imaging does not demonstrate any acute changes or findings.  She was discharged from the ED the same day.  I personally reviewed and went over radiographic studies with the patient.  The results are noted within this dictation.    She denies any blood in her stool, dark stool, hematuria, hemoptysis, epistaxis, gingival bleeding, vaginal bleeding.   Past Medical History  Diagnosis Date  . GERD (gastroesophageal reflux disease)   . Mental retardation     lesion in head  . Dysphagia   . Constipation   . Seizures ([place])     "not fully controlled on max doses of meds" (Neuro ofc note 12/2014)  . Thrombocytopenia ([place])     has CLOSED FRACTURE OF ACROMIAL END OF CLAVICLE; Seizures ([place]); GERD (gastroesophageal reflux disease); Thrombocytopenia ([place]); Mental retardation; Generalized convulsive epilepsy ([place]); Encounter for therapeutic drug monitoring; Bone fibrous dysplasia of skull; Chest pain; Upper abdominal pain; and Seizure disorder ([place]) on her problem list.     is allergic to codeine and lamictal.  Ms. Dumm does not currently have medications on  file.  Past Surgical History  Procedure Laterality Date  . Total abdominal hysterectomy    . Tonsillectomy and adenoidectomy    . Skin graft to gum Right 08/2013  . Cataract extraction      both eyes, May of 2015  . Mouth surgery    . Esophagogastroduodenoscopy (egd) with propofol N/A 09/16/2014    Procedure: ESOPHAGOGASTRODUODENOSCOPY (EGD) WITH PROPOFOL;  Surgeon: Rogene Houston, MD;  Location: [facility];  Service: Endoscopy;  Laterality: N/A;  . Esophageal dilation N/A 09/16/2014    Procedure: ESOPHAGEAL DILATION WITH 54FR MALONEY DILATOR;  Surgeon: Rogene Houston, MD;  Location: [facility];  Service: Endoscopy;  Laterality: N/A;  . Esophageal biopsy  09/16/2014    Procedure: BIOPSY;  Surgeon: Rogene Houston, MD;  Location: [facility];  Service: Endoscopy;;    Denies any headaches, dizziness, double vision, fevers, chills, night sweats, nausea, vomiting, diarrhea, constipation, chest pain, heart palpitations, shortness of breath, blood in stool, black tarry stool, urinary pain, urinary burning, urinary frequency, hematuria.   PHYSICAL EXAMINATION  ECOG PERFORMANCE STATUS: 0 - Asymptomatic  Filed Vitals:   05/30/15 1337  BP: 113/66  Pulse: 71  Temp: 98.2 F (36.8 C)  Resp: 16    GENERAL:alert, no distress, well nourished, well developed, comfortable, cooperative and smiling, accompanied by mother. SKIN: skin color, texture, turgor are normal, no rashes or significant lesions HEAD: Normocephalic, No masses, lesions, tenderness or abnormalities EYES: normal, PERRLA, EOMI, Conjunctiva are pink and non-injected EARS: External ears normal OROPHARYNX:lips, buccal mucosa, and tongue normal and mucous membranes are moist  NECK: supple, no adenopathy, thyroid normal size, non-tender,  without nodularity, no stridor, non-tender, trachea midline LYMPH:  no palpable lymphadenopathy, no hepatosplenomegaly BREAST:not examined LUNGS: clear to auscultation  HEART: regular rate & rhythm, no murmurs  and no gallops ABDOMEN:abdomen soft, non-tender and normal bowel sounds BACK: Back symmetric, no curvature., No CVA tenderness EXTREMITIES:less then 2 second capillary refill, no joint deformities, effusion, or inflammation, no edema, no skin discoloration, no clubbing, no cyanosis  NEURO: alert & oriented x 3 with fluent speech, no focal motor/sensory deficits, gait normal   LABORATORY DATA: CBC    Component Value Date/Time   WBC 11.8* 05/26/2015 1439   RBC 4.43 05/26/2015 1439   HGB 14.1 05/26/2015 1439   HCT 42.1 05/26/2015 1439   PLT 129* 05/26/2015 1439   MCV 95.0 05/26/2015 1439   MCH 31.8 05/26/2015 1439   MCHC 33.5 05/26/2015 1439   RDW 13.5 05/26/2015 1439   LYMPHSABS 2.4 10/26/2014 2330   MONOABS 0.9 10/26/2014 2330   EOSABS 0.1 10/26/2014 2330   BASOSABS 0.0 10/26/2014 2330      Chemistry      Component Value Date/Time   NA 141 05/26/2015 1439   NA 141 10/07/2012 1050   K 4.6 05/26/2015 1439   CL 104 05/26/2015 1439   CO2 27 05/26/2015 1439   BUN 21* 05/26/2015 1439   BUN 19 10/07/2012 1050   CREATININE 0.87 05/26/2015 1439   CREATININE 0.84 12/10/2012 1325      Component Value Date/Time   CALCIUM 9.6 05/26/2015 1439   ALKPHOS 65 05/26/2015 1439   AST 27 05/26/2015 1439   ALT 28 05/26/2015 1439   BILITOT 0.7 05/26/2015 1439        ASSESSMENT AND PLAN:  Thrombocytopenia Stable x many years, thus far consistent with low-grade idiopathic thrombocytopenic purpura in the setting of low-med IgG anticardiolipin antibody and negative platelet antibodies.  Labs today (CBC diff) cancelled as she had labs performed on 05/26/2015.  Labs the other day were very stable.  Labs in 9 months: CBC diff.  Return in 9 months for follow-up.  Bone fibrous dysplasia of skull Stable.  Oncology work-up negative including  multiple myeloma work-up and occult malignancy work-up with CT CAP.   Followed by Dr. Jannifer Franklin, neurology.   Seizure disorder, on medications and managed  by Dr. Jannifer Franklin.   History of total abdominal hysterectomy in the past.    THERAPY PLAN:  We will continue to monitor labs.  All questions were answered. The patient knows to call the clinic with any problems, questions or concerns. We can certainly see the patient much sooner if necessary.  Patient and plan discussed with Dr. Ancil Linsey and she is in agreement with the aforementioned.   This note is electronically signed by: Robynn Pane 05/30/2015 2:51 PM

## 2015-05-30 NOTE — Assessment & Plan Note (Signed)
Stable.  Oncology work-up negative including  multiple myeloma work-up and occult malignancy work-up with CT CAP.   Followed by Dr. Jannifer Franklin, neurology.

## 2015-05-30 NOTE — Patient Instructions (Signed)
[place] at [facility] Discharge Instructions  RECOMMENDATIONS MADE BY THE CONSULTANT AND ANY TEST RESULTS WILL BE SENT TO YOUR REFERRING PHYSICIAN.  Exam and discussion by Robynn Pane, PA-C Labs are stable from you ED visit. Call with any concerns or issues.  Follow-up in 9 months with labs and office visit.  Thank you for choosing [place] at [facility] to provide your oncology and hematology care.  To afford each patient quality time with our provider, please arrive at least 15 minutes before your scheduled appointment time.    You need to re-schedule your appointment should you arrive 10 or more minutes late.  We strive to give you quality time with our providers, and arriving late affects you and other patients whose appointments are after yours.  Also, if you no show three or more times for appointments you may be dismissed from the clinic at the providers discretion.     Again, thank you for choosing [facility].  Our hope is that these requests will decrease the amount of time that you wait before being seen by our physicians.       _____________________________________________________________  Should you have questions after your visit to [facility], please contact our office at [phone] between the hours of 8:30 a.m. and 4:30 p.m.  Voicemails left after 4:30 p.m. will not be returned until the following business day.  For prescription refill requests, have your pharmacy contact our office.

## 2015-05-30 NOTE — Assessment & Plan Note (Addendum)
Stable x many years, thus far consistent with low-grade idiopathic thrombocytopenic purpura in the setting of low-med IgG anticardiolipin antibody and negative platelet antibodies.  Labs today (CBC diff) cancelled as she had labs performed on 05/26/2015.  Labs the other day were very stable.  Labs in 9 months: CBC diff.  Return in 9 months for follow-up.

## 2015-06-01 ENCOUNTER — Other Ambulatory Visit: Payer: Self-pay | Admitting: Nurse Practitioner

## 2015-06-06 ENCOUNTER — Encounter (INDEPENDENT_AMBULATORY_CARE_PROVIDER_SITE_OTHER): Payer: Self-pay | Admitting: *Deleted

## 2015-06-06 ENCOUNTER — Ambulatory Visit (INDEPENDENT_AMBULATORY_CARE_PROVIDER_SITE_OTHER): Payer: Medicare Other | Admitting: Internal Medicine

## 2015-06-06 ENCOUNTER — Other Ambulatory Visit: Payer: Self-pay

## 2015-06-06 MED ORDER — LACOSAMIDE 200 MG PO TABS: 200.0000 mg | ORAL_TABLET | Freq: Two times a day (BID) | ORAL | Status: DC

## 2015-06-16 DIAGNOSIS — Z1389 Encounter for screening for other disorder: Secondary | ICD-10-CM | POA: Diagnosis not present

## 2015-06-16 DIAGNOSIS — J209 Acute bronchitis, unspecified: Secondary | ICD-10-CM | POA: Diagnosis not present

## 2015-06-16 DIAGNOSIS — K219 Gastro-esophageal reflux disease without esophagitis: Secondary | ICD-10-CM | POA: Diagnosis not present

## 2015-06-16 DIAGNOSIS — J019 Acute sinusitis, unspecified: Secondary | ICD-10-CM | POA: Diagnosis not present

## 2015-06-16 DIAGNOSIS — Z6823 Body mass index (BMI) 23.0-23.9, adult: Secondary | ICD-10-CM | POA: Diagnosis not present

## 2015-07-11 DIAGNOSIS — J209 Acute bronchitis, unspecified: Secondary | ICD-10-CM | POA: Diagnosis not present

## 2015-07-11 DIAGNOSIS — D696 Thrombocytopenia, unspecified: Secondary | ICD-10-CM | POA: Diagnosis not present

## 2015-07-11 DIAGNOSIS — Z6824 Body mass index (BMI) 24.0-24.9, adult: Secondary | ICD-10-CM | POA: Diagnosis not present

## 2015-07-11 DIAGNOSIS — G40909 Epilepsy, unspecified, not intractable, without status epilepticus: Secondary | ICD-10-CM | POA: Diagnosis not present

## 2015-07-11 DIAGNOSIS — J019 Acute sinusitis, unspecified: Secondary | ICD-10-CM | POA: Diagnosis not present

## 2015-08-15 ENCOUNTER — Ambulatory Visit (INDEPENDENT_AMBULATORY_CARE_PROVIDER_SITE_OTHER): Payer: Medicare Other | Admitting: Internal Medicine

## 2015-09-05 ENCOUNTER — Ambulatory Visit (INDEPENDENT_AMBULATORY_CARE_PROVIDER_SITE_OTHER): Payer: Medicare Other | Admitting: Internal Medicine

## 2015-09-05 ENCOUNTER — Telehealth (INDEPENDENT_AMBULATORY_CARE_PROVIDER_SITE_OTHER): Payer: Self-pay | Admitting: Internal Medicine

## 2015-09-05 ENCOUNTER — Encounter (INDEPENDENT_AMBULATORY_CARE_PROVIDER_SITE_OTHER): Payer: Self-pay | Admitting: Internal Medicine

## 2015-09-05 VITALS — BP 108/70 | HR 64 | Temp 97.7°F | Ht 66.0 in | Wt 143.0 lb

## 2015-09-05 DIAGNOSIS — K219 Gastro-esophageal reflux disease without esophagitis: Secondary | ICD-10-CM

## 2015-09-05 NOTE — Patient Instructions (Signed)
Continue the Dexilant. OV in 1 year. 

## 2015-09-05 NOTE — Progress Notes (Signed)
Subjective:    Patient ID: Maria [name], female    DOB: Apr 21, 1961, 55 y.o.   MRN: [id number]  HPI   Here today for f/u. She was last seen by Dr. Laural Golden in April of 2016 for dysphagia and underwent an EGD/ED. Hx of chronic GERD and maintained on Dexilant. She denies dysphagia. She denies any abdominal pain. She usually has a BM daily. No melena or BRRB.  Last weight 152. Today her weight is 142. She is doing good. She get up at 10. Her appetite is good. Her mother states patient does not exercise.      09/16/2014   EGD with ED  Indications:  Patient is 56 year old Caucasian female was chronic GERD and presents with dysphagia predominantly solids. She has history of Schatzki's ring and esophagus has been dilated on 3 occasions and more recently in 2011. Patient is also complaining of right foot pain since she have fall few days ago.             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 Noncritical Schatzki's ring and small sliding hiatal hernia. Small hyperplastic appearing polyps at gastric body. These were left alone. Erosive gastritis with small gastric ulcer at antrum. Esophagus dilated by passing 52 French Maloney dilator resulting in linear disruption at GE junction and also it proximal esophagus indicative of disrupted web.                                                                                                            Review of Systems Past Medical History  Diagnosis Date  . GERD (gastroesophageal reflux disease)   . Mental retardation     lesion in head  . Dysphagia   . Constipation   . Seizures ([place])     "not fully controlled on max doses of meds" (Neuro ofc note 12/2014)  . Thrombocytopenia [facility])     Past Surgical History  Procedure Laterality Date  . Total abdominal hysterectomy    . Tonsillectomy and adenoidectomy    . Skin graft to gum Right 08/2013  . Cataract extraction      both eyes, May of 2015  . Mouth  surgery    . Esophagogastroduodenoscopy (egd) with propofol N/A 09/16/2014    Procedure: ESOPHAGOGASTRODUODENOSCOPY (EGD) WITH PROPOFOL;  Surgeon: Rogene Houston, MD;  Location: [facility];  Service: Endoscopy;  Laterality: N/A;  . Esophageal dilation N/A 09/16/2014    Procedure: ESOPHAGEAL DILATION WITH 54FR MALONEY DILATOR;  Surgeon: Rogene Houston, MD;  Location: [facility];  Service: Endoscopy;  Laterality: N/A;  . Biopsy  09/16/2014    Procedure: BIOPSY;  Surgeon: Rogene Houston, MD;  Location: [facility];  Service: Endoscopy;;    Allergies  Allergen Reactions  . Codeine     REACTION: nausea, vomiting  . Lamictal [Lamotrigine]     Skin rash    Current Outpatient Prescriptions on File Prior to Visit  Medication Sig Dispense Refill  . Calcium Carb-Cholecalciferol (CALCIUM 500 +D) 500-400 MG-UNIT TABS Take 1 tablet  by mouth 2 (two) times daily.    . Calcium Carbonate Antacid (TUMS PO) Take 1 tablet by mouth as needed (heartburn).     . cholecalciferol (VITAMIN D) 1000 UNITS tablet Take 1,000 Units by mouth daily. Reported on 05/30/2015    . DEXILANT 60 MG capsule TAKE 1 CAPSULE 30 MINUTES BEFORE BREAKFAST DAILY 90 capsule 3  . divalproex (DEPAKOTE ER) 500 MG 24 hr tablet TAKE 1 TABLET TWICE A DAY 180 tablet 1  . estradiol (VIVELLE-DOT) 0.075 MG/24HR Place 1 patch onto the skin 2 (two) times a week. On Wednesday and Saturday    . Fish Oil-Cholecalciferol (FISH OIL + D3 PO) Take 2 capsules by mouth daily.    Marland Kitchen HYDROcodone-acetaminophen (NORCO/VICODIN) 5-325 MG per tablet Take 1 tablet by mouth every 4 (four) hours as needed. Take this medication with food 15 tablet 0  . ibuprofen (ADVIL,MOTRIN) 600 MG tablet Take 1 tablet (600 mg total) by mouth every 6 (six) hours as needed for moderate pain. 30 tablet 0  . lacosamide (VIMPAT) 200 MG TABS tablet Take 1 tablet (200 mg total) by mouth 2 (two) times daily. 180 tablet 1  . latanoprost (XALATAN) 0.005 % ophthalmic solution Place 1 drop into both eyes at  bedtime. Reported on 05/30/2015    . levocetirizine (XYZAL) 5 MG tablet Take 5 mg by mouth every evening.     . ondansetron (ZOFRAN) 8 MG tablet Take 8 mg by mouth every 8 (eight) hours as needed for nausea or vomiting. Reported on 05/30/2015    . ondansetron (ZOFRAN-ODT) 8 MG disintegrating tablet Take 1 tablet (8 mg total) by mouth every 8 (eight) hours as needed for nausea or vomiting. 6 tablet 0  . oxybutynin (DITROPAN-XL) 5 MG 24 hr tablet Take 5 mg by mouth daily.    . Pediatric Multiple Vit-C-FA (PEDIATRIC MULTIVITAMIN) chewable tablet Chew 1 tablet by mouth daily.       No current facility-administered medications on file prior to visit.        Objective:   Physical Exam Blood pressure 108/70, pulse 64, temperature 97.7 F (36.5 C), height 5\' 6"  (1.676 m), weight 143 lb (64.864 kg). Alert and oriented. Skin warm and dry. Oral mucosa is moist.   . Sclera anicteric, conjunctivae is pink. Thyroid not enlarged. No cervical lymphadenopathy. Lungs clear. Heart regular rate and rhythm.  Abdomen is soft. Bowel sounds are positive. No hepatomegaly. No abdominal masses felt. No tenderness.  No edema to lower extremities.          Assessment & Plan:  Chronic GERD. Heartburn is well controlled with therapy Dysphagia: Last EGD/ED in 09/2015. No problems with dysphagia at this time. OV in one year.

## 2015-09-05 NOTE — Telephone Encounter (Signed)
errror

## 2015-09-14 ENCOUNTER — Ambulatory Visit (INDEPENDENT_AMBULATORY_CARE_PROVIDER_SITE_OTHER): Payer: Medicare Other | Admitting: Adult Health

## 2015-09-14 ENCOUNTER — Encounter: Payer: Self-pay | Admitting: Adult Health

## 2015-09-14 VITALS — BP 109/69 | HR 71 | Ht 66.0 in | Wt 143.6 lb

## 2015-09-14 DIAGNOSIS — Z5181 Encounter for therapeutic drug level monitoring: Secondary | ICD-10-CM | POA: Diagnosis not present

## 2015-09-14 DIAGNOSIS — R569 Unspecified convulsions: Secondary | ICD-10-CM | POA: Diagnosis not present

## 2015-09-14 NOTE — Patient Instructions (Signed)
Continue Depakote and Vimpat Blood work today If your symptoms worsen or you develop new symptoms please let us know.

## 2015-09-14 NOTE — Progress Notes (Signed)
PATIENT: Maria [name] DOB: Jul 20, 1960  REASON FOR VISIT: follow up- seizures HISTORY FROM: patient  HISTORY OF PRESENT ILLNESS: Maria [name] is a 55 year old female with a history of intractable epilepsy. She returns today for follow-up dose. She is currently on Depakote and Vimpat. She is tolerating these medications well. She has had several seizures since the beginning of the year. Her mother states that this typically happens were she will have frequent seizures for several months and then go several months with no seizures. She states that she has been under  stress recently and this typically causes her seizures. She was at home with her mother. She is able to complete all ADLs independently. She does not operate a motor vehicle. She denies any changes with her gait or balance. But does acknowledge that her balance is not great. She returns today for an evaluation.  HISTORY 12/14/14: Maria [name] is a 55 year old right-handed white female with a history of intractable epilepsy. The patient is on fairly maximal doses of Depakote and Vimpat. The patient is tolerating these medications well, and these medications have reduced the frequency of the seizures, but the patient still has 3 or 4 seizures a year. The last known seizure was around 10/19/2014. The patient fell on 10/30/2014 and fractured her right hand. It is not clear that she had a seizure associated with the fall. The patient herself remembers the fall. The patient is still in a cast or splint on the right hand and wrist. The patient otherwise is functioning fairly well. She has had blood work done in May 2016 that included a valproic acid level with a result of 110. A CBC and a comprehensive metabolic profile were also done, these studies were unremarkable.  REVIEW OF SYSTEMS: Out of a complete 14 system review of symptoms, the patient complains only of the following symptoms, and all other reviewed systems are negative.  See  HPI  ALLERGIES: Allergies  Allergen Reactions  . Codeine     REACTION: nausea, vomiting  . Lamictal [Lamotrigine]     Skin rash    HOME MEDICATIONS: Outpatient Prescriptions Prior to Visit  Medication Sig Dispense Refill  . Calcium Carb-Cholecalciferol (CALCIUM 500 +D) 500-400 MG-UNIT TABS Take 1 tablet by mouth 2 (two) times daily.    . Calcium Carbonate Antacid (TUMS PO) Take 1 tablet by mouth as needed (heartburn).     . cholecalciferol (VITAMIN D) 1000 UNITS tablet Take 1,000 Units by mouth daily. Reported on 05/30/2015    . DEXILANT 60 MG capsule TAKE 1 CAPSULE 30 MINUTES BEFORE BREAKFAST DAILY 90 capsule 3  . divalproex (DEPAKOTE ER) 500 MG 24 hr tablet TAKE 1 TABLET TWICE A DAY 180 tablet 1  . estradiol (VIVELLE-DOT) 0.075 MG/24HR Place 1 patch onto the skin 2 (two) times a week. On Wednesday and Saturday    . Fish Oil-Cholecalciferol (FISH OIL + D3 PO) Take 2 capsules by mouth daily.    Marland Kitchen HYDROcodone-acetaminophen (NORCO/VICODIN) 5-325 MG per tablet Take 1 tablet by mouth every 4 (four) hours as needed. Take this medication with food 15 tablet 0  . ibuprofen (ADVIL,MOTRIN) 600 MG tablet Take 1 tablet (600 mg total) by mouth every 6 (six) hours as needed for moderate pain. 30 tablet 0  . lacosamide (VIMPAT) 200 MG TABS tablet Take 1 tablet (200 mg total) by mouth 2 (two) times daily. 180 tablet 1  . levocetirizine (XYZAL) 5 MG tablet Take 5 mg by mouth every evening.     Marland Kitchen  ondansetron (ZOFRAN) 8 MG tablet Take 8 mg by mouth every 8 (eight) hours as needed for nausea or vomiting. Reported on 05/30/2015    . ondansetron (ZOFRAN-ODT) 8 MG disintegrating tablet Take 1 tablet (8 mg total) by mouth every 8 (eight) hours as needed for nausea or vomiting. 6 tablet 0  . oxybutynin (DITROPAN-XL) 5 MG 24 hr tablet Take 5 mg by mouth daily.    . Pediatric Multiple Vit-C-FA (PEDIATRIC MULTIVITAMIN) chewable tablet Chew 1 tablet by mouth daily.      Marland Kitchen latanoprost (XALATAN) 0.005 % ophthalmic  solution Place 1 drop into both eyes at bedtime. Reported on 09/14/2015     No facility-administered medications prior to visit.    PAST MEDICAL HISTORY: Past Medical History  Diagnosis Date  . GERD (gastroesophageal reflux disease)   . Mental retardation     lesion in head  . Dysphagia   . Constipation   . Seizures ([place])     "not fully controlled on max doses of meds" (Neuro ofc note 12/2014)  . Thrombocytopenia ([place])     PAST SURGICAL HISTORY: Past Surgical History  Procedure Laterality Date  . Total abdominal hysterectomy    . Tonsillectomy and adenoidectomy    . Skin graft to gum Right 08/2013  . Cataract extraction      both eyes, May of 2015  . Mouth surgery    . Esophagogastroduodenoscopy (egd) with propofol N/A 09/16/2014    Procedure: ESOPHAGOGASTRODUODENOSCOPY (EGD) WITH PROPOFOL;  Surgeon: Rogene Houston, MD;  Location: [facility];  Service: Endoscopy;  Laterality: N/A;  . Esophageal dilation N/A 09/16/2014    Procedure: ESOPHAGEAL DILATION WITH 54FR MALONEY DILATOR;  Surgeon: Rogene Houston, MD;  Location: [facility];  Service: Endoscopy;  Laterality: N/A;  . Biopsy  09/16/2014    Procedure: BIOPSY;  Surgeon: Rogene Houston, MD;  Location: [facility];  Service: Endoscopy;;    FAMILY HISTORY: Family History  Problem Relation Age of Onset  . High Cholesterol Mother   . High blood pressure Mother   . Diabetes Father     SOCIAL HISTORY:     PHYSICAL EXAM  Filed Vitals:   09/14/15 1317  BP: 109/69  Pulse: 71  Height: 5\' 6"  (1.676 m)  Weight: 143 lb 9.6 oz (65.137 kg)   Body mass index is 23.19 kg/(m^2).  Generalized: Well developed, in no acute distress   Neurological examination  Mentation: Alert oriented to time, place, history taking. Follows all commands speech and language fluent Cranial nerve II-XII: Pupils were equal round reactive to light. Extraocular movements were full, visual field were full on confrontational test. Facial sensation and strength were  normal. Uvula tongue midline. Head turning and shoulder shrug  were normal and symmetric. Motor: The motor testing reveals 5 over 5 strength of all 4 extremities. Good symmetric motor tone is noted throughout.  Sensory: Sensory testing is intact to soft touch on all 4 extremities. No evidence of extinction is noted.  Coordination: Cerebellar testing reveals good finger-nose-finger and heel-to-shin bilaterally.  Gait and station: Gait is normal. Tandem gait is unsteady. Romberg is negative. No drift is seen.  Reflexes: Deep tendon reflexes are symmetric and normal bilaterally.   DIAGNOSTIC DATA (LABS, IMAGING, TESTING) - I reviewed patient records, labs, notes, testing and imaging myself where available.  Lab Results  Component Value Date   WBC 11.8* 05/26/2015   HGB 14.1 05/26/2015   HCT 42.1 05/26/2015   MCV 95.0 05/26/2015   PLT 129* 05/26/2015  Component Value Date/Time   NA 141 05/26/2015 1439   NA 141 10/07/2012 1050   K 4.6 05/26/2015 1439   CL 104 05/26/2015 1439   CO2 27 05/26/2015 1439   GLUCOSE 128* 05/26/2015 1439   GLUCOSE 80 10/07/2012 1050   BUN 21* 05/26/2015 1439   BUN 19 10/07/2012 1050   CREATININE 0.87 05/26/2015 1439   CREATININE 0.84 12/10/2012 1325   CALCIUM 9.6 05/26/2015 1439   PROT 7.2 05/26/2015 1439   PROT 6.4 10/07/2012 1050   ALBUMIN 4.3 05/26/2015 1439   ALBUMIN 4.4 10/07/2012 1050   AST 27 05/26/2015 1439   ALT 28 05/26/2015 1439   ALKPHOS 65 05/26/2015 1439   BILITOT 0.7 05/26/2015 1439   GFRNONAA >60 05/26/2015 1439   GFRAA >60 05/26/2015 1439    No results found for: HGBA1C Lab Results  Component Value Date   [id number] 05/23/2011   Lab Results  Component Value Date   TSH 2.660 03/16/2014      ASSESSMENT AND PLAN 55 y.o. year old female  has a past medical history of GERD (gastroesophageal reflux disease); Mental retardation; Dysphagia; Constipation; Seizures ([place]); and Thrombocytopenia ([place]). here with:  1.  Intractable epilepsy  The patient will continue on Depakote and Vimpat. The patient's mom does not want to adjust any dosing or add on any medication. We will check blood work today. Patient and her mom advised that if her symptoms worsen or she develops any new symptoms she should let us know. She will follow-up in one year or sooner if needed.     Ward Givens, MSN, NP-C 09/14/2015, 2:04 PM [facility] [address] [place], [place] [address] [phone]

## 2015-09-14 NOTE — Progress Notes (Signed)
I have read the note, and I agree with the clinical assessment and plan.  Maria [name],Maria [name]   

## 2015-09-15 ENCOUNTER — Telehealth: Payer: Self-pay | Admitting: Adult Health

## 2015-09-15 LAB — CBC WITH DIFFERENTIAL/PLATELET
BASOS ABS: 0 10*3/uL (ref 0.0–0.2)
BASOS: 0 %
EOS (ABSOLUTE): 0.1 10*3/uL (ref 0.0–0.4)
Eos: 1 %
HEMOGLOBIN: 13.5 g/dL (ref 11.1–15.9)
Hematocrit: 38.8 % (ref 34.0–46.6)
IMMATURE GRANS (ABS): 0 10*3/uL (ref 0.0–0.1)
IMMATURE GRANULOCYTES: 0 %
LYMPHS: 33 %
Lymphocytes Absolute: 2.5 10*3/uL (ref 0.7–3.1)
MCH: 32.4 pg (ref 26.6–33.0)
MCHC: 34.8 g/dL (ref 31.5–35.7)
MCV: 93 fL (ref 79–97)
MONOCYTES: 11 %
Monocytes Absolute: 0.8 10*3/uL (ref 0.1–0.9)
NEUTROS ABS: 4.2 10*3/uL (ref 1.4–7.0)
NEUTROS PCT: 55 %
PLATELETS: 120 10*3/uL — AB (ref 150–379)
RBC: 4.17 x10E6/uL (ref 3.77–5.28)
RDW: 15.2 % (ref 12.3–15.4)
WBC: 7.7 10*3/uL (ref 3.4–10.8)

## 2015-09-15 LAB — COMPREHENSIVE METABOLIC PANEL
ALBUMIN: 4.3 g/dL (ref 3.5–5.5)
ALT: 14 IU/L (ref 0–32)
AST: 13 IU/L (ref 0–40)
Albumin/Globulin Ratio: 2 (ref 1.2–2.2)
Alkaline Phosphatase: 53 IU/L (ref 39–117)
BUN / CREAT RATIO: 24 — AB (ref 9–23)
BUN: 20 mg/dL (ref 6–24)
Bilirubin Total: 0.3 mg/dL (ref 0.0–1.2)
CO2: 25 mmol/L (ref 18–29)
CREATININE: 0.84 mg/dL (ref 0.57–1.00)
Calcium: 9.4 mg/dL (ref 8.7–10.2)
Chloride: 98 mmol/L (ref 96–106)
GFR calc non Af Amer: 80 mL/min/{1.73_m2} (ref >59)
GFR, EST AFRICAN AMERICAN: 92 mL/min/{1.73_m2} (ref >59)
GLUCOSE: 89 mg/dL (ref 65–99)
Globulin, Total: 2.1 g/dL (ref 1.5–4.5)
Potassium: 5 mmol/L (ref 3.5–5.2)
Sodium: 138 mmol/L (ref 134–144)
TOTAL PROTEIN: 6.4 g/dL (ref 6.0–8.5)

## 2015-09-15 LAB — VALPROIC ACID LEVEL: VALPROIC ACID LVL: 109 ug/mL — AB (ref 50–100)

## 2015-09-15 NOTE — Telephone Encounter (Signed)
I called but unable to leave a VM. Will try again when I return to the office on Tuesday. Lab work is normal and Depakote level is consistent with previous lab work. Please let patient know if she calls.

## 2015-09-19 DIAGNOSIS — M79605 Pain in left leg: Secondary | ICD-10-CM | POA: Diagnosis not present

## 2015-09-19 DIAGNOSIS — Z6822 Body mass index (BMI) 22.0-22.9, adult: Secondary | ICD-10-CM | POA: Diagnosis not present

## 2015-09-19 NOTE — Progress Notes (Signed)
Quick Note:  Called and spoke to [place] relayed Blood work was ok. Depakote is consistent with previous. Ellyn Hack. Details. ______

## 2015-09-19 NOTE — Telephone Encounter (Signed)
Called and spoke to [place] and relayed Lab work was  Normal  Depakote Level consistent with previous lab. Romie Minus understood details.

## 2015-09-20 ENCOUNTER — Telehealth (INDEPENDENT_AMBULATORY_CARE_PROVIDER_SITE_OTHER): Payer: Self-pay | Admitting: Internal Medicine

## 2015-09-20 NOTE — Telephone Encounter (Signed)
Patient's mother called stating that she has observed some side effects with the patient's medication.  She doesn't feel she can continue to administer the medication, is there anything else she can take?  This is the first day she has not given it to her.  [phone]

## 2015-09-21 ENCOUNTER — Telehealth (INDEPENDENT_AMBULATORY_CARE_PROVIDER_SITE_OTHER): Payer: Self-pay | Admitting: Internal Medicine

## 2015-09-21 DIAGNOSIS — K219 Gastro-esophageal reflux disease without esophagitis: Secondary | ICD-10-CM

## 2015-09-21 MED ORDER — OMEPRAZOLE 40 MG PO CPDR: 40.0000 mg | DELAYED_RELEASE_CAPSULE | Freq: Every day | ORAL | Status: DC

## 2015-09-21 NOTE — Telephone Encounter (Signed)
Mother came by stating Maria [name] had jerking motions and fell. She thinks it is a reaction from the [place]. I doubt this is true, however I will switch her to Omeprazole 40mg  daily with 3 refills and see how she does. Maria [name] may indeed ned to follow up with a neurologist.

## 2015-09-21 NOTE — Telephone Encounter (Signed)
Rx for Omeprazole sent to her pharmacy. She will stop the Dexilant for now.

## 2015-10-01 ENCOUNTER — Emergency Department (HOSPITAL_COMMUNITY): Payer: Medicare Other

## 2015-10-01 ENCOUNTER — Encounter (HOSPITAL_COMMUNITY): Payer: Self-pay | Admitting: *Deleted

## 2015-10-01 ENCOUNTER — Emergency Department (HOSPITAL_COMMUNITY)
Admission: EM | Admit: 2015-10-01 | Discharge: 2015-10-01 | Disposition: A | Discharge status: Home / Self Care | Payer: Medicare Other | Attending: Emergency Medicine | Admitting: Emergency Medicine

## 2015-10-01 DIAGNOSIS — Z87891 Personal history of nicotine dependence: Secondary | ICD-10-CM | POA: Insufficient documentation

## 2015-10-01 DIAGNOSIS — R079 Chest pain, unspecified: Secondary | ICD-10-CM | POA: Diagnosis not present

## 2015-10-01 DIAGNOSIS — J9811 Atelectasis: Secondary | ICD-10-CM | POA: Diagnosis not present

## 2015-10-01 DIAGNOSIS — R0789 Other chest pain: Secondary | ICD-10-CM | POA: Diagnosis not present

## 2015-10-01 DIAGNOSIS — R05 Cough: Secondary | ICD-10-CM | POA: Diagnosis not present

## 2015-10-01 DIAGNOSIS — Z79899 Other long term (current) drug therapy: Secondary | ICD-10-CM | POA: Diagnosis not present

## 2015-10-01 MED ORDER — DIATRIZOATE MEGLUMINE & SODIUM 66-10 % PO SOLN
ORAL | Status: AC
  Administered 2015-10-01: 30 mL
  Filled 2015-10-01: qty 30

## 2015-10-01 NOTE — Discharge Instructions (Signed)
Give her liquids and soft foods today such as mashed potatoes, Jell-O, applesauce, oatmeal. She continues to do well today tomorrow start a regular diet. If she continues to have discomfort but Dr. Ewell Poe office know on Monday, May 1. Return to the ED if her chest pain gets worse or if she is unable to swallow.

## 2015-10-01 NOTE — ED Notes (Signed)
Pt given water to drink. 

## 2015-10-01 NOTE — ED Provider Notes (Signed)
CSN: [id number]     Arrival date & time 10/01/15  0122 History   First MD Initiated Contact with Patient 10/01/15 [phone]   Chief Complaint  Patient presents with  . Cough   Level V caveat for mental retardation  (Consider location/radiation/quality/duration/timing/severity/associated sxs/prior Treatment) HPI patient presents to the emergency department with her mother who gives most of the history. Mother states they were trying to play cards tonight and patient got upset because they kept getting phone calls and interruptions. She had been eating small but her finger candy bars and felt like she got something stuck in her throat. She tried eating cheese to see if that would make it past. She complains of pain in her upper chest area and states it feels just like it did in the past when she had another food impaction. She states her chest feels tight. She denies pain on swallowing. She's not spitting her saliva into a cup. Mother also states she was started on omeprazole on April 21 and at first was concerned that she was having an allergic reaction to that.  PCP [facility] Dr Laural Golden  Past Medical History  Diagnosis Date  . GERD (gastroesophageal reflux disease)   . Mental retardation     lesion in head  . Dysphagia   . Constipation   . Seizures ([place])     "not fully controlled on max doses of meds" (Neuro ofc note 12/2014)  . Thrombocytopenia [facility])    Past Surgical History  Procedure Laterality Date  . Total abdominal hysterectomy    . Tonsillectomy and adenoidectomy    . Skin graft to gum Right 08/2013  . Cataract extraction      both eyes, May of 2015  . Mouth surgery    . Esophagogastroduodenoscopy (egd) with propofol N/A 09/16/2014    Procedure: ESOPHAGOGASTRODUODENOSCOPY (EGD) WITH PROPOFOL;  Surgeon: Rogene Houston, MD;  Location: [facility]S;  Service: Endoscopy;  Laterality: N/A;  . Esophageal dilation N/A 09/16/2014    Procedure: ESOPHAGEAL DILATION WITH 54FR MALONEY  DILATOR;  Surgeon: Rogene Houston, MD;  Location: [facility]S;  Service: Endoscopy;  Laterality: N/A;  . Biopsy  09/16/2014    Procedure: BIOPSY;  Surgeon: Rogene Houston, MD;  Location: [facility];  Service: Endoscopy;;   Family History  Problem Relation Age of Onset  . High Cholesterol Mother   . High blood pressure Mother   . Diabetes Father    Social History  Substance Use Topics  . Smoking status: Former Smoker -- 1.50 packs/day for 15 years    Types: Cigarettes    Quit date: 05/22/2006  . Smokeless tobacco: Never Used  . Alcohol Use: No   Lives with her mother  OB History    No data available     Review of Systems  All other systems reviewed and are negative.     Allergies  Codeine and Lamictal  Home Medications   Prior to Admission medications   Medication Sig Start Date End Date Taking? Authorizing Provider  Calcium Carb-Cholecalciferol (CALCIUM 500 +D) 500-400 MG-UNIT TABS Take 1 tablet by mouth 2 (two) times daily.   Yes Historical Provider, MD  Calcium Carbonate Antacid (TUMS PO) Take 1 tablet by mouth as needed (heartburn).    Yes Historical Provider, MD  cholecalciferol (VITAMIN D) 1000 UNITS tablet Take 1,000 Units by mouth daily. Reported on 05/30/2015   Yes Historical Provider, MD  divalproex (DEPAKOTE ER) 500 MG 24 hr tablet TAKE 1 TABLET TWICE  A DAY 06/01/15  Yes Dennie Bible, NP  estradiol (VIVELLE-DOT) 0.075 MG/24HR Place 1 patch onto the skin 2 (two) times a week. On Wednesday and Saturday   Yes Historical Provider, MD  Fish Oil-Cholecalciferol (FISH OIL + D3 PO) Take 2 capsules by mouth daily.   Yes Historical Provider, MD  ibuprofen (ADVIL,MOTRIN) 600 MG tablet Take 1 tablet (600 mg total) by mouth every 6 (six) hours as needed for moderate pain. 01/15/15  Yes Evalee Jefferson, PA-C  lacosamide (VIMPAT) 200 MG TABS tablet Take 1 tablet (200 mg total) by mouth 2 (two) times daily. 06/06/15  Yes Kathrynn Ducking, MD  levocetirizine (XYZAL) 5 MG tablet Take 5  mg by mouth every evening.    Yes Historical Provider, MD  omeprazole (PRILOSEC) 40 MG capsule Take 1 capsule (40 mg total) by mouth daily. 09/21/15  Yes Butch Penny, NP  oxybutynin (DITROPAN-XL) 5 MG 24 hr tablet Take 5 mg by mouth daily.   Yes Historical Provider, MD  Pediatric Multiple Vit-C-FA (PEDIATRIC MULTIVITAMIN) chewable tablet Chew 1 tablet by mouth daily.     Yes Historical Provider, MD  DEXILANT 60 MG capsule TAKE 1 CAPSULE 30 MINUTES BEFORE BREAKFAST DAILY 03/13/15   Rogene Houston, MD  HYDROcodone-acetaminophen (NORCO/VICODIN) 5-325 MG per tablet Take 1 tablet by mouth every 4 (four) hours as needed. Take this medication with food 10/30/14   Lily Kocher, PA-C  ondansetron (ZOFRAN) 8 MG tablet Take 8 mg by mouth every 8 (eight) hours as needed for nausea or vomiting. Reported on 05/30/2015    Historical Provider, MD  ondansetron (ZOFRAN-ODT) 8 MG disintegrating tablet Take 1 tablet (8 mg total) by mouth every 8 (eight) hours as needed for nausea or vomiting. 05/26/15   Francine Graven, DO   BP 114/71 mmHg  Pulse 68  Temp(Src) 98.5 F (36.9 C) (Oral)  Resp 18  Ht 5\' 6"  (1.676 m)  Wt 140 lb (63.504 kg)  BMI 22.61 kg/m2  SpO2 97%  Vital signs normal   Physical Exam  Constitutional: She is oriented to person, place, and time. She appears well-developed and well-nourished.  Non-toxic appearance. She does not appear ill. No distress.  HENT:  Head: Normocephalic and atraumatic.  Right Ear: External ear normal.  Left Ear: External ear normal.  Nose: Nose normal. No mucosal edema or rhinorrhea.  Mouth/Throat: Oropharynx is clear and moist and mucous membranes are normal. No dental abscesses or uvula swelling.  Eyes: Conjunctivae and EOM are normal. Pupils are equal, round, and reactive to light.  Neck: Normal range of motion and full passive range of motion without pain. Neck supple.  Cardiovascular: Normal rate, regular rhythm and normal heart sounds.  Exam reveals no gallop  and no friction rub.   No murmur heard. Pulmonary/Chest: Effort normal and breath sounds normal. No respiratory distress. She has no wheezes. She has no rhonchi. She has no rales. She exhibits no tenderness and no crepitus.  Has a cough at times  Abdominal: Soft. Normal appearance and bowel sounds are normal. She exhibits no distension. There is no tenderness. There is no rebound and no guarding.  Musculoskeletal: Normal range of motion. She exhibits no edema or tenderness.  Moves all extremities well.   Neurological: She is alert and oriented to person, place, and time. She has normal strength. No cranial nerve deficit.  Seems mentally slow  Skin: Skin is warm, dry and intact. No rash noted. No erythema. No pallor.  Psychiatric: She has a normal  mood and affect. Her speech is normal and behavior is normal. Her mood appears not anxious.  Nursing note and vitals reviewed.   ED Course  Procedures (including critical care time)  Medications  diatrizoate meglumine-sodium (GASTROGRAFIN) 66-10 % solution (30 mLs  Given 10/01/15 0243)   Patient was given Gastrografin to swallow an x-ray was done to look for esophageal foreign body.  I reviewed her x-ray and the Gastrografin is seen in her stomach. EKG was done after reviewing her x-ray.  Patient was given oral fluids to drink. She drank without difficulty. Patient states her chest discomfort is improving to the point where it's almost gone. She states she feels ready to go home. I talked to the mother about how to let her eat or drink later today. She is to be on liquids and soft foods such as mashed potatoes or applesauce today. She can go to a regular diet tomorrow. She should let Dr. Ewell Poe office know tomorrow if she still having difficulty swallowing. She should return to the ED if her chest pain gets worse or if she is unable to swallow.   Labs Review Labs Reviewed - No data to display  Imaging Review Dg Chest 1 View  10/01/2015   CLINICAL DATA:  Possible esophageal food impaction from candy bar. Cough and choking. Initial encounter. EXAM: CHEST 1 VIEW COMPARISON:  Chest radiograph performed 05/26/2015 FINDINGS: The lungs are well-aerated. Scattered high-density foci about both sides of the chest may be outside the patient. Minimal bibasilar atelectasis is noted. There is no evidence of pleural effusion or pneumothorax. The cardiomediastinal silhouette is borderline enlarged. No acute osseous abnormalities are seen. IMPRESSION: Minimal bibasilar atelectasis noted.  Borderline cardiomegaly. Electronically Signed   By: Garald Balding M.D.   On: 10/01/2015 03:24   I have personally reviewed and evaluated these images and lab results as part of my medical decision-making.   EKG Interpretation   Date/Time:  Sunday October 01 2015 03:35:22 EDT Ventricular Rate:  63 PR Interval:  135 QRS Duration: 87 QT Interval:  398 QTC Calculation: 407 R Axis:   48 Text Interpretation:  Sinus rhythm Low voltage, precordial leads RSR' in  V1 or V2, probably normal variant Baseline wander No significant change  since last tracing 26 May 2015 Confirmed by Roald Lukacs  MD-I, Keymani Mclean ([id number]) on  10/01/2015 3:41:24 AM      MDM   Final diagnoses:  Chest pain, unspecified chest pain type   Plan discharge  Rolland Porter, MD, Barbette Or, MD 10/01/15 [phone]

## 2015-10-01 NOTE — ED Notes (Signed)
Pt ambulated to restroom & returned to room w/ no complications. 

## 2015-10-01 NOTE — ED Notes (Signed)
Family member states pt was eating candy bar yesterday evening around 2200 & began coughing. Pt presents w/ cough, no respiratory distress noted. Family member says pt has had food stuck in her throat in the past

## 2015-10-01 NOTE — ED Notes (Signed)
Pt alert & oriented x4, stable gait. Patient given discharge instructions, paperwork & prescription(s). Patient  instructed to stop at the registration desk to finish any additional paperwork. Patient verbalized understanding. Pt left department w/ no further questions. 

## 2015-10-01 NOTE — ED Notes (Signed)
Pt states that she was eating a candy bar while playing cards tonight, did not get choked but feels that she may have some of the candy bar stuck in her throat, pt has had a cough since playing cards,  Has had problems with food being stuck in her throat in the past, pt alert, able to answer questions, airway intact,

## 2015-10-27 ENCOUNTER — Encounter (HOSPITAL_COMMUNITY): Payer: Self-pay | Admitting: Emergency Medicine

## 2015-10-27 ENCOUNTER — Emergency Department (HOSPITAL_COMMUNITY)
Admission: EM | Admit: 2015-10-27 | Discharge: 2015-10-27 | Disposition: A | Discharge status: Home / Self Care | Payer: Medicare Other | Attending: Emergency Medicine | Admitting: Emergency Medicine

## 2015-10-27 ENCOUNTER — Emergency Department (HOSPITAL_COMMUNITY): Payer: Medicare Other

## 2015-10-27 DIAGNOSIS — W19XXXA Unspecified fall, initial encounter: Secondary | ICD-10-CM | POA: Insufficient documentation

## 2015-10-27 DIAGNOSIS — Z8669 Personal history of other diseases of the nervous system and sense organs: Secondary | ICD-10-CM | POA: Insufficient documentation

## 2015-10-27 DIAGNOSIS — Z79899 Other long term (current) drug therapy: Secondary | ICD-10-CM | POA: Diagnosis not present

## 2015-10-27 DIAGNOSIS — Y999 Unspecified external cause status: Secondary | ICD-10-CM | POA: Diagnosis not present

## 2015-10-27 DIAGNOSIS — D696 Thrombocytopenia, unspecified: Secondary | ICD-10-CM | POA: Diagnosis not present

## 2015-10-27 DIAGNOSIS — M25571 Pain in right ankle and joints of right foot: Secondary | ICD-10-CM | POA: Diagnosis present

## 2015-10-27 DIAGNOSIS — T07 Unspecified multiple injuries: Secondary | ICD-10-CM | POA: Diagnosis not present

## 2015-10-27 DIAGNOSIS — T148 Other injury of unspecified body region: Secondary | ICD-10-CM | POA: Diagnosis not present

## 2015-10-27 DIAGNOSIS — Z87891 Personal history of nicotine dependence: Secondary | ICD-10-CM | POA: Insufficient documentation

## 2015-10-27 DIAGNOSIS — S82841A Displaced bimalleolar fracture of right lower leg, initial encounter for closed fracture: Secondary | ICD-10-CM | POA: Diagnosis not present

## 2015-10-27 DIAGNOSIS — Y939 Activity, unspecified: Secondary | ICD-10-CM | POA: Diagnosis not present

## 2015-10-27 DIAGNOSIS — M25561 Pain in right knee: Secondary | ICD-10-CM | POA: Diagnosis not present

## 2015-10-27 DIAGNOSIS — S8251XA Displaced fracture of medial malleolus of right tibia, initial encounter for closed fracture: Secondary | ICD-10-CM | POA: Diagnosis not present

## 2015-10-27 DIAGNOSIS — Y92009 Unspecified place in unspecified non-institutional (private) residence as the place of occurrence of the external cause: Secondary | ICD-10-CM | POA: Insufficient documentation

## 2015-10-27 DIAGNOSIS — S82301A Unspecified fracture of lower end of right tibia, initial encounter for closed fracture: Secondary | ICD-10-CM

## 2015-10-27 DIAGNOSIS — S8991XA Unspecified injury of right lower leg, initial encounter: Secondary | ICD-10-CM | POA: Diagnosis not present

## 2015-10-27 DIAGNOSIS — R229 Localized swelling, mass and lump, unspecified: Secondary | ICD-10-CM | POA: Diagnosis not present

## 2015-10-27 HISTORY — DX: Tremor, unspecified: R25.1

## 2015-10-27 MED ORDER — OXYCODONE-ACETAMINOPHEN 5-325 MG PO TABS
1.0000 | ORAL_TABLET | Freq: Once | ORAL | Status: AC
  Administered 2015-10-27: 1 via ORAL
  Filled 2015-10-27: qty 1

## 2015-10-27 MED ORDER — OXYCODONE-ACETAMINOPHEN 5-325 MG PO TABS: 1.0000 | ORAL_TABLET | ORAL | Status: DC | PRN

## 2015-10-27 NOTE — ED Notes (Signed)
Attempted to ambulate pt who could not apply pressure to right leg.  States her knee hurts too badly.  Placed back in bed and Dr. Lacinda Axon notified.

## 2015-10-27 NOTE — ED Notes (Signed)
Pt ambulated to bathroom after walker applied.  Pt tolerated walking well with assist, but is very unsure of self.  Mother advises pt uses a walker at home and she will ensure she is using it when ambulating.

## 2015-10-27 NOTE — ED Notes (Signed)
Mother verbalizes understanding of discharge instructions, prescriptions, home care and follow up care. Patient out of department at this time.

## 2015-10-27 NOTE — Discharge Instructions (Signed)
Maria [name]  has a small fracture on both sides of her ankle. Large boot. Ice. Elevate. Pain medication. Follow-up with orthopedic surgeon. Prescription for wheelchair and walker given.

## 2015-10-27 NOTE — ED Notes (Addendum)
Per EMS, pt had an unwitnessed fall at home. Pt hx of seizures. Pt states she had a seizure, but can recall all events. Pt poor historian. Pt also c/o RT ankle pain and bilateral knee pain. Edema noted to RT ankle. Pt AOx4. EDP at bedside.

## 2015-10-27 NOTE — ED Provider Notes (Signed)
MSE was initiated and I personally evaluated the patient and placed orders (if any) at  7:00 AM on Oct 27, 2015.  The patient appears stable so that the remainder of the MSE may be completed by another provider.  Patient presents by implants after a fall from home with injury to the right ankle and right knee. No obvious deformity. She is being sent for x-rays.  Delora Fuel, MD [phone]

## 2015-11-01 ENCOUNTER — Ambulatory Visit: Payer: Medicare Other | Admitting: Orthopaedic Surgery

## 2015-11-02 DIAGNOSIS — S82841A Displaced bimalleolar fracture of right lower leg, initial encounter for closed fracture: Secondary | ICD-10-CM | POA: Diagnosis not present

## 2015-11-03 ENCOUNTER — Telehealth: Payer: Self-pay | Admitting: Neurology

## 2015-11-03 ENCOUNTER — Telehealth: Payer: Self-pay | Admitting: Adult Health

## 2015-11-03 DIAGNOSIS — T1490 Injury, unspecified: Secondary | ICD-10-CM | POA: Diagnosis not present

## 2015-11-03 MED ORDER — DIAZEPAM 2 MG PO TABS: 2.0000 mg | ORAL_TABLET | Freq: Four times a day (QID) | ORAL | Status: DC | PRN

## 2015-11-03 NOTE — Telephone Encounter (Signed)
I called patient, talk with the mother. The patient has episodes of agitation, fell last week, fractured her ankle, low-dose diazepam does help her some, in the past she has had increased seizure frequency during periods of being upset or stressed. I will call in a small prescription for diazepam in low dose.

## 2015-11-03 NOTE — Telephone Encounter (Signed)
Pt's mother called wanting RX for valium. Sts she just gave her a pill and would like a RX and to be told what she is doing is the right thing. That is all information she chose to relay. She did mention her daughter was very agitated and that is the reason she gave the valium. sts the pt has broken her ankle, the mother said "and I am in a very bad way". Please call asap

## 2015-11-03 NOTE — Telephone Encounter (Signed)
error 

## 2015-11-08 ENCOUNTER — Emergency Department (HOSPITAL_COMMUNITY): Payer: Medicare Other

## 2015-11-08 ENCOUNTER — Encounter (HOSPITAL_COMMUNITY): Payer: Self-pay | Admitting: *Deleted

## 2015-11-08 ENCOUNTER — Emergency Department (HOSPITAL_COMMUNITY)
Admission: EM | Admit: 2015-11-08 | Discharge: 2015-11-08 | Disposition: A | Discharge status: Home / Self Care | Payer: Medicare Other | Source: Home / Self Care | Attending: Emergency Medicine | Admitting: Emergency Medicine

## 2015-11-08 DIAGNOSIS — Y939 Activity, unspecified: Secondary | ICD-10-CM

## 2015-11-08 DIAGNOSIS — G40409 Other generalized epilepsy and epileptic syndromes, not intractable, without status epilepticus: Secondary | ICD-10-CM | POA: Diagnosis not present

## 2015-11-08 DIAGNOSIS — M542 Cervicalgia: Secondary | ICD-10-CM | POA: Diagnosis not present

## 2015-11-08 DIAGNOSIS — Y999 Unspecified external cause status: Secondary | ICD-10-CM | POA: Insufficient documentation

## 2015-11-08 DIAGNOSIS — G253 Myoclonus: Secondary | ICD-10-CM | POA: Diagnosis not present

## 2015-11-08 DIAGNOSIS — R51 Headache: Secondary | ICD-10-CM | POA: Diagnosis not present

## 2015-11-08 DIAGNOSIS — S0990XA Unspecified injury of head, initial encounter: Secondary | ICD-10-CM | POA: Diagnosis not present

## 2015-11-08 DIAGNOSIS — Z79899 Other long term (current) drug therapy: Secondary | ICD-10-CM

## 2015-11-08 DIAGNOSIS — Y929 Unspecified place or not applicable: Secondary | ICD-10-CM | POA: Insufficient documentation

## 2015-11-08 DIAGNOSIS — S199XXA Unspecified injury of neck, initial encounter: Secondary | ICD-10-CM | POA: Diagnosis not present

## 2015-11-08 DIAGNOSIS — Z87891 Personal history of nicotine dependence: Secondary | ICD-10-CM | POA: Insufficient documentation

## 2015-11-08 DIAGNOSIS — F79 Unspecified intellectual disabilities: Secondary | ICD-10-CM | POA: Diagnosis not present

## 2015-11-08 DIAGNOSIS — S8991XA Unspecified injury of right lower leg, initial encounter: Secondary | ICD-10-CM | POA: Diagnosis not present

## 2015-11-08 DIAGNOSIS — R251 Tremor, unspecified: Secondary | ICD-10-CM | POA: Diagnosis not present

## 2015-11-08 DIAGNOSIS — G40909 Epilepsy, unspecified, not intractable, without status epilepticus: Secondary | ICD-10-CM | POA: Diagnosis not present

## 2015-11-08 DIAGNOSIS — R296 Repeated falls: Secondary | ICD-10-CM | POA: Diagnosis not present

## 2015-11-08 DIAGNOSIS — R2689 Other abnormalities of gait and mobility: Secondary | ICD-10-CM | POA: Diagnosis not present

## 2015-11-08 DIAGNOSIS — K219 Gastro-esophageal reflux disease without esophagitis: Secondary | ICD-10-CM | POA: Diagnosis not present

## 2015-11-08 DIAGNOSIS — S8261XA Displaced fracture of lateral malleolus of right fibula, initial encounter for closed fracture: Secondary | ICD-10-CM | POA: Insufficient documentation

## 2015-11-08 DIAGNOSIS — S8251XA Displaced fracture of medial malleolus of right tibia, initial encounter for closed fracture: Secondary | ICD-10-CM | POA: Insufficient documentation

## 2015-11-08 DIAGNOSIS — M25561 Pain in right knee: Secondary | ICD-10-CM | POA: Diagnosis not present

## 2015-11-08 DIAGNOSIS — R262 Difficulty in walking, not elsewhere classified: Secondary | ICD-10-CM

## 2015-11-08 DIAGNOSIS — M25571 Pain in right ankle and joints of right foot: Secondary | ICD-10-CM | POA: Diagnosis not present

## 2015-11-08 DIAGNOSIS — G9341 Metabolic encephalopathy: Secondary | ICD-10-CM | POA: Diagnosis not present

## 2015-11-08 DIAGNOSIS — S3993XA Unspecified injury of pelvis, initial encounter: Secondary | ICD-10-CM | POA: Diagnosis not present

## 2015-11-08 DIAGNOSIS — W1839XA Other fall on same level, initial encounter: Secondary | ICD-10-CM

## 2015-11-08 DIAGNOSIS — I6789 Other cerebrovascular disease: Secondary | ICD-10-CM | POA: Diagnosis not present

## 2015-11-08 DIAGNOSIS — G9389 Other specified disorders of brain: Secondary | ICD-10-CM | POA: Diagnosis not present

## 2015-11-08 LAB — BASIC METABOLIC PANEL WITH GFR
Anion gap: 9 (ref 5–15)
BUN: 19 mg/dL (ref 6–20)
CO2: 25 mmol/L (ref 22–32)
Calcium: 9.3 mg/dL (ref 8.9–10.3)
Chloride: 104 mmol/L (ref 101–111)
Creatinine, Ser: 0.87 mg/dL (ref 0.44–1.00)
GFR calc Af Amer: >60 mL/min
GFR calc non Af Amer: >60 mL/min
Glucose, Bld: 102 mg/dL — ABNORMAL HIGH (ref 65–99)
Potassium: 3.6 mmol/L (ref 3.5–5.1)
Sodium: 138 mmol/L (ref 135–145)

## 2015-11-08 LAB — URINALYSIS, ROUTINE W REFLEX MICROSCOPIC
BILIRUBIN URINE: NEGATIVE
Glucose, UA: NEGATIVE mg/dL
Hgb urine dipstick: NEGATIVE
KETONES UR: NEGATIVE mg/dL
LEUKOCYTES UA: NEGATIVE
NITRITE: NEGATIVE
PH: 6 (ref 5.0–8.0)
Protein, ur: NEGATIVE mg/dL
SPECIFIC GRAVITY, URINE: 1.025 (ref 1.005–1.030)

## 2015-11-08 LAB — CBC
HCT: 36.4 % (ref 36.0–46.0)
Hemoglobin: 12.1 g/dL (ref 12.0–15.0)
MCH: 32 pg (ref 26.0–34.0)
MCHC: 33.2 g/dL (ref 30.0–36.0)
MCV: 96.3 fL (ref 78.0–100.0)
Platelets: 215 K/uL (ref 150–400)
RBC: 3.78 MIL/uL — ABNORMAL LOW (ref 3.87–5.11)
RDW: 13.2 % (ref 11.5–15.5)
WBC: 7 K/uL (ref 4.0–10.5)

## 2015-11-08 LAB — VALPROIC ACID LEVEL: Valproic Acid Lvl: 98 ug/mL (ref 50.0–100.0)

## 2015-11-08 MED ORDER — HYDROCODONE-ACETAMINOPHEN 5-325 MG PO TABS
1.0000 | ORAL_TABLET | Freq: Once | ORAL | Status: AC
  Administered 2015-11-08: 1 via ORAL
  Filled 2015-11-08: qty 1

## 2015-11-08 NOTE — ED Notes (Signed)
Pt given meal tray with MD approval.

## 2015-11-08 NOTE — Discharge Instructions (Signed)
Continue treatment of the ankle fracture as already planned. Make an appointment to follow-up with [facility]. Care management is going to give the option of a home health visit.

## 2015-11-08 NOTE — ED Notes (Signed)
MD at bedside. 

## 2015-11-08 NOTE — ED Provider Notes (Signed)
CSN: [id number]     Arrival date & time 11/08/15  1328 History   First MD Initiated Contact with Patient 11/08/15 1328     Chief Complaint  Patient presents with  . Fall  PT CAME IN VIA EMS FOR FALL.  PT HAS A HX OF MR AND IS UNABLE TO GIVE A GOOD HX.  THE PT'S MOTHER SAID THAT THIS IS HER 3RD FALL RECENTLY.  MOTHER SAID THAT PT WAS UP WITH HER WALKER AND HAD SOME TREMORS (LASTING ABOUT 5 SECONDS).  MOM IS UNSURE IF PT HAD A LOC OR NOT, BUT SHE DID LET GO OF HER WALKER AND FELL AGAIN.  THE PT C/O PAIN TO HER RIGHT ANKLE.  PT HAS A FRACTURE OF RIGHT MEDIAL AND LATERAL MALLEOLUS AND IS WEARING A WALKING BOOT.  THE MOTHER FEELS LIKE SHE CAN'T TAKE CARE OF PT BECAUSE SHE FALLS.  MOTHER IS ELDERLY AND IS QUITE FRAIL.  SHE IS UNABLE TO PICK PT UP ONCE PT FALLS.  NOTE IN COMPUTER SAID THAT MOM CALLED DR. Jannifer Franklin (NEUROLOGY) ON 6/2 AND ASKED FOR A LOW DOSE OF VALIUM FOR PT.  HE CALLED IN A RX.  (Consider location/radiation/quality/duration/timing/severity/associated sxs/prior Treatment) The history is provided by the patient and a parent. The history is limited by a developmental delay.    Past Medical History  Diagnosis Date  . GERD (gastroesophageal reflux disease)   . Mental retardation     lesion in head  . Dysphagia   . Constipation   . Seizures ([place])     "not fully controlled on max doses of meds" (Neuro ofc note 12/2014)  . Thrombocytopenia ([place])   . Tremor    Past Surgical History  Procedure Laterality Date  . Total abdominal hysterectomy    . Tonsillectomy and adenoidectomy    . Skin graft to gum Right 08/2013  . Cataract extraction      both eyes, May of 2015  . Mouth surgery    . Esophagogastroduodenoscopy (egd) with propofol N/A 09/16/2014    Procedure: ESOPHAGOGASTRODUODENOSCOPY (EGD) WITH PROPOFOL;  Surgeon: Rogene Houston, MD;  Location: [facility];  Service: Endoscopy;  Laterality: N/A;  . Esophageal dilation N/A 09/16/2014    Procedure: ESOPHAGEAL DILATION WITH 54FR MALONEY  DILATOR;  Surgeon: Rogene Houston, MD;  Location: [facility];  Service: Endoscopy;  Laterality: N/A;  . Biopsy  09/16/2014    Procedure: BIOPSY;  Surgeon: Rogene Houston, MD;  Location: [facility];  Service: Endoscopy;;   Family History  Problem Relation Age of Onset  . High Cholesterol Mother   . High blood pressure Mother   . Diabetes Father    Social History  Substance Use Topics  . Smoking status: Former Smoker -- 1.50 packs/day for 15 years    Types: Cigarettes    Quit date: 05/22/2006  . Smokeless tobacco: Never Used  . Alcohol Use: No   OB History    No data available     Review of Systems  Musculoskeletal:       RIGHT ANKLE AND KNEE PAIN  All other systems reviewed and are negative.     Allergies  Codeine and Lamictal  Home Medications   Prior to Admission medications   Medication Sig Start Date End Date Taking? Authorizing Provider  Calcium Carb-Cholecalciferol (CALCIUM 500 +D) 500-400 MG-UNIT TABS Take 1 tablet by mouth 2 (two) times daily.   Yes Historical Provider, MD  Calcium Carbonate Antacid (TUMS PO) Take 1 tablet by mouth daily as  needed (heartburn).    Yes Historical Provider, MD  cholecalciferol (VITAMIN D) 1000 UNITS tablet Take 1,000 Units by mouth daily. Reported on 05/30/2015   Yes Historical Provider, MD  diazepam (VALIUM) 2 MG tablet Take 1 tablet (2 mg total) by mouth every 6 (six) hours as needed for anxiety. 11/03/15  Yes Kathrynn Ducking, MD  divalproex (DEPAKOTE ER) 500 MG 24 hr tablet TAKE 1 TABLET TWICE A DAY 06/01/15  Yes Dennie Bible, NP  docusate sodium (COLACE) 100 MG capsule Take 100 mg by mouth daily.   Yes Historical Provider, MD  estradiol (VIVELLE-DOT) 0.075 MG/24HR Place 1 patch onto the skin 2 (two) times a week. On Wednesday and Saturday   Yes Historical Provider, MD  ibuprofen (ADVIL,MOTRIN) 600 MG tablet Take 1 tablet (600 mg total) by mouth every 6 (six) hours as needed for moderate pain. 01/15/15  Yes Evalee Jefferson, PA-C    lacosamide (VIMPAT) 200 MG TABS tablet Take 1 tablet (200 mg total) by mouth 2 (two) times daily. 06/06/15  Yes Kathrynn Ducking, MD  levocetirizine (XYZAL) 5 MG tablet Take 5 mg by mouth every evening.    Yes Historical Provider, MD  omeprazole (PRILOSEC) 40 MG capsule Take 1 capsule (40 mg total) by mouth daily. 09/21/15  Yes Butch Penny, NP  oxybutynin (DITROPAN-XL) 5 MG 24 hr tablet Take 5 mg by mouth daily.   Yes Historical Provider, MD  oxyCODONE-acetaminophen (PERCOCET) 5-325 MG tablet Take 1 tablet by mouth every 4 (four) hours as needed. Patient taking differently: Take 1 tablet by mouth every 4 (four) hours as needed for moderate pain.  10/27/15  Yes Nat Christen, MD  Pediatric Multiple Vit-C-FA (PEDIATRIC MULTIVITAMIN) chewable tablet Chew 1 tablet by mouth daily.     Yes Historical Provider, MD  HYDROcodone-acetaminophen (NORCO/VICODIN) 5-325 MG tablet Take 1 tablet my mouth every 4 hours as needed for pain. 11/06/15   Historical Provider, MD   BP 106/61 mmHg  Pulse 78  Temp(Src) 98.1 F (36.7 C) (Oral)  Resp 16  Ht 5\' 6"  (1.676 m)  Wt 140 lb (63.504 kg)  BMI 22.61 kg/m2  SpO2 96% Physical Exam  Constitutional: She is oriented to person, place, and time. She appears well-developed and well-nourished.  HENT:  Head: Normocephalic and atraumatic.  Right Ear: External ear normal.  Left Ear: External ear normal.  Nose: Nose normal.  Mouth/Throat: Oropharynx is clear and moist.  Eyes: Conjunctivae and EOM are normal. Pupils are equal, round, and reactive to light.  Neck: Normal range of motion. Neck supple.  Cardiovascular: Normal rate, regular rhythm, normal heart sounds and intact distal pulses.   Pulmonary/Chest: Effort normal and breath sounds normal.  Abdominal: Soft. Bowel sounds are normal.  Musculoskeletal:       Feet:  Neurological: She is alert and oriented to person, place, and time.  Skin: Skin is warm and dry.  Psychiatric: She has a normal mood and affect. Her  behavior is normal. Thought content normal.  Nursing note and vitals reviewed.   ED Course  Procedures (including critical care time) Labs Review Labs Reviewed  CBC - Abnormal; Notable for the following:    RBC 3.78 (*)    All other components within normal limits  BASIC METABOLIC PANEL - Abnormal; Notable for the following:    Glucose, Bld 102 (*)    All other components within normal limits  VALPROIC ACID LEVEL  URINALYSIS, ROUTINE W REFLEX MICROSCOPIC (NOT AT [facility])    Imaging  Review No results found. I have personally reviewed and evaluated these images and lab results as part of my medical decision-making.   EKG Interpretation None      MDM  WHEN I SPOKE TO MOM AND PT TO TRY TO DISCHARGE, PT C/O ADDITIONAL PAIN.  I ADDED ON CT HEAD, NECK, AND XRAY PELVIS ALONG WITH LABS.  PT WILL BE SIGNED OUT TO DR. Venita Sheffield. Final diagnoses:  Ambulatory dysfunction  Seizure disorder ([place])  Fracture of right ankle, lateral malleolus, closed, initial encounter  Fracture of ankle, medial malleolus, right, closed, initial encounter      Isla Pence, MD 11/11/15 [phone]

## 2015-11-08 NOTE — ED Provider Notes (Addendum)
Results for orders placed or performed during the hospital encounter of 11/08/15  Valproic acid level  Result Value Ref Range   Valproic Acid Lvl 98 50.0 - 100.0 ug/mL  CBC  Result Value Ref Range   WBC 7.0 4.0 - 10.5 K/uL   RBC 3.78 (L) 3.87 - 5.11 MIL/uL   Hemoglobin 12.1 12.0 - 15.0 g/dL   HCT 36.4 36.0 - 46.0 %   MCV 96.3 78.0 - 100.0 fL   MCH 32.0 26.0 - 34.0 pg   MCHC 33.2 30.0 - 36.0 g/dL   RDW 13.2 11.5 - 15.5 %   Platelets 215 150 - 400 K/uL  Basic metabolic panel  Result Value Ref Range   Sodium 138 135 - 145 mmol/L   Potassium 3.6 3.5 - 5.1 mmol/L   Chloride 104 101 - 111 mmol/L   CO2 25 22 - 32 mmol/L   Glucose, Bld 102 (H) 65 - 99 mg/dL   BUN 19 6 - 20 mg/dL   Creatinine, Ser 0.87 0.44 - 1.00 mg/dL   Calcium 9.3 8.9 - 10.3 mg/dL   GFR calc non Af Amer >60 >60 mL/min   GFR calc Af Amer >60 >60 mL/min   Anion gap 9 5 - 15  Urinalysis, Routine w reflex microscopic (not at [facility])  Result Value Ref Range   Color, Urine YELLOW YELLOW   APPearance CLEAR CLEAR   Specific Gravity, Urine 1.025 1.005 - 1.030   pH 6.0 5.0 - 8.0   Glucose, UA NEGATIVE NEGATIVE mg/dL   Hgb urine dipstick NEGATIVE NEGATIVE   Bilirubin Urine NEGATIVE NEGATIVE   Ketones, ur NEGATIVE NEGATIVE mg/dL   Protein, ur NEGATIVE NEGATIVE mg/dL   Nitrite NEGATIVE NEGATIVE   Leukocytes, UA NEGATIVE NEGATIVE   Dg Pelvis 1-2 Views  11/08/2015  CLINICAL DATA:  55 year old female status post fall today. Initial encounter. EXAM: PELVIS - 1-2 VIEW COMPARISON:  CT Abdomen and Pelvis 05/26/2015 and earlier. FINDINGS: Femoral heads are normally located. Chronic acetabular degenerative spurring is not significantly changed. Grossly intact proximal femurs. Pelvis bone mineralization is normal. Pelvis appears intact. Grossly intact sacral ala and SI joints. Negative visible bowel gas pattern. Chronic right hemipelvis phlebolith. IMPRESSION: No acute fracture or dislocation identified about the pelvis. Electronically  Signed   By: Genevie Ann M.D.   On: 11/08/2015 15:42   Dg Ankle Complete Right  11/08/2015  CLINICAL DATA:  Twisting injury with pain of right ankle. Initial encounter. EXAM: RIGHT ANKLE - COMPLETE 3+ VIEW COMPARISON:  Right tibia and fibula films on 09/14/2009 FINDINGS: There are acute fractures of both the lateral and medial malleoli. Oblique fracture of the distal fibula shows mild displacement. Transverse fracture of the medial malleolus shows mild displacement along the medial cortex. The ankle mortise shows normal alignment. No posterior malleolar fracture identified. Diffuse soft tissue swelling identified. IMPRESSION: Mildly displaced fractures of both the right lateral and medial malleoli. Electronically Signed   By: Aletta Edouard M.D.   On: 11/08/2015 14:00   Dg Ankle Complete Right  10/27/2015  CLINICAL DATA:  Unwitnessed fall at home.  Right ankle pain. EXAM: RIGHT ANKLE - COMPLETE 3+ VIEW COMPARISON:  None. FINDINGS: Transverse fracture at the base of the medial malleolus. There is an oblique fracture through the distal fibula, best seen on the lateral view. Ankle mortise appears intact. Mild diffuse soft tissue swelling. IMPRESSION: Distal fibular and medial malleolar fractures. Electronically Signed   By: Rolm Baptise M.D.   On: 10/27/2015 07:41  Ct Head Wo Contrast  11/08/2015  CLINICAL DATA:  Pain following fall EXAM: CT HEAD WITHOUT CONTRAST CT CERVICAL SPINE WITHOUT CONTRAST TECHNIQUE: Multidetector CT imaging of the head and cervical spine was performed following the standard protocol without intravenous contrast. Multiplanar CT image reconstructions of the cervical spine were also generated. COMPARISON:  CT head and CT cervical spine Oct 30, 2014 FINDINGS: CT HEAD FINDINGS There is mild stable supratentorial atrophy with moderate cerebellar atrophy, stable. There is no intracranial mass, hemorrhage, extra-axial fluid collection, or midline shift. There is stable encephalomalacia in the  medial inferior right frontal lobe, stable. Elsewhere gray-white compartments appear normal. No acute infarct evident. There is a right frontal scalp hematoma. The bony calvarium appears intact. A prominent venous [place] is noted in the right frontal bone, stable. There is opacification of several inferior mastoid air cells bilaterally. Most of the mastoid air cells are clear. No intraorbital lesions are evident. There is rightward deviation of the nasal septum. There is debris in the right external auditory canal. CT CERVICAL SPINE FINDINGS There is no fracture or spondylolisthesis. Prevertebral soft tissues and predental space regions are normal. There is moderate disc space narrowing at C5-6 and C6-7. There is slightly milder narrowing at all other levels. There is multilevel facet osteoarthritic change. There is exit foraminal narrowing due to bony hypertrophy at C3-4 bilaterally, C4-5 on the right, C5-6 bilaterally, and C6-7 on the left. There is impression on the exiting nerve roots at C4-5 on the right and C5-6 bilaterally. IMPRESSION: CT head: Atrophy, greatest in the cerebellar region. Encephalomalacia in the inferomedial right frontal lobe. No intracranial mass, hemorrhage, extra-axial fluid collection, or acute infarct. There is a right frontal scalp hematoma. Mild chronic mastoid disease bilaterally is stable. There is probable cerumen in the right external auditory canal. CT cervical spine: Acute no acute fracture or spondylolisthesis. Multilevel arthropathy. Impression on several exiting nerve roots is noted due to bony hypertrophy. Electronically Signed   By: Lowella Grip III M.D.   On: 11/08/2015 16:06   Ct Cervical Spine Wo Contrast  11/08/2015  CLINICAL DATA:  Pain following fall EXAM: CT HEAD WITHOUT CONTRAST CT CERVICAL SPINE WITHOUT CONTRAST TECHNIQUE: Multidetector CT imaging of the head and cervical spine was performed following the standard protocol without intravenous contrast.  Multiplanar CT image reconstructions of the cervical spine were also generated. COMPARISON:  CT head and CT cervical spine Oct 30, 2014 FINDINGS: CT HEAD FINDINGS There is mild stable supratentorial atrophy with moderate cerebellar atrophy, stable. There is no intracranial mass, hemorrhage, extra-axial fluid collection, or midline shift. There is stable encephalomalacia in the medial inferior right frontal lobe, stable. Elsewhere gray-white compartments appear normal. No acute infarct evident. There is a right frontal scalp hematoma. The bony calvarium appears intact. A prominent venous [place] is noted in the right frontal bone, stable. There is opacification of several inferior mastoid air cells bilaterally. Most of the mastoid air cells are clear. No intraorbital lesions are evident. There is rightward deviation of the nasal septum. There is debris in the right external auditory canal. CT CERVICAL SPINE FINDINGS There is no fracture or spondylolisthesis. Prevertebral soft tissues and predental space regions are normal. There is moderate disc space narrowing at C5-6 and C6-7. There is slightly milder narrowing at all other levels. There is multilevel facet osteoarthritic change. There is exit foraminal narrowing due to bony hypertrophy at C3-4 bilaterally, C4-5 on the right, C5-6 bilaterally, and C6-7 on the left. There is impression on the  exiting nerve roots at C4-5 on the right and C5-6 bilaterally. IMPRESSION: CT head: Atrophy, greatest in the cerebellar region. Encephalomalacia in the inferomedial right frontal lobe. No intracranial mass, hemorrhage, extra-axial fluid collection, or acute infarct. There is a right frontal scalp hematoma. Mild chronic mastoid disease bilaterally is stable. There is probable cerumen in the right external auditory canal. CT cervical spine: Acute no acute fracture or spondylolisthesis. Multilevel arthropathy. Impression on several exiting nerve roots is noted due to bony  hypertrophy. Electronically Signed   By: Lowella Grip III M.D.   On: 11/08/2015 16:06   Dg Knee Complete 4 Views Right  11/08/2015  CLINICAL DATA:  Status post fall today with a blow to the right knee. Pain. Initial encounter. EXAM: RIGHT KNEE - COMPLETE 4+ VIEW COMPARISON:  None. FINDINGS: No evidence of fracture, dislocation, or joint effusion. No evidence of arthropathy or other focal bone abnormality. Soft tissues are unremarkable. IMPRESSION: Negative exam. Electronically Signed   By: Inge Rise M.D.   On: 11/08/2015 13:55   Dg Knee Complete 4 Views Right  10/27/2015  CLINICAL DATA:  Unwitnessed fall at home.  Bilateral knee pain. EXAM: RIGHT KNEE - COMPLETE 4+ VIEW COMPARISON:  None. FINDINGS: No acute bony abnormality. Specifically, no fracture, subluxation, or dislocation. Soft tissues are intact. Joint spaces are maintained. Normal bone mineralization. No visible significant joint effusion P IMPRESSION: No acute bony abnormality. Electronically Signed   By: Rolm Baptise M.D.   On: 10/27/2015 07:41    CT head and neck without any acute abnormalities. Patient visited by case management. I think they're going to offer a home health nurse visit. Patient without any indication for admission.  In addition Depakote level is normal at 98.  Fredia Sorrow, MD 11/08/15 [place], MD 11/08/15 [phone]

## 2015-11-08 NOTE — ED Notes (Addendum)
Pt comes in vis EMS for fall. Pt was walking out of her house when her right foot got twisted up and caused her to fall back on that foot landing on her left knee. Pt denies pain in any other area.    Pt is oriented to person, place. Disoriented to time. Pt has MR in history. EMS states pt lives with her mother and is unsure if family is coming up here.

## 2015-11-09 ENCOUNTER — Encounter: Payer: Self-pay | Admitting: Neurology

## 2015-11-09 ENCOUNTER — Encounter (HOSPITAL_COMMUNITY): Payer: Self-pay | Admitting: Family Medicine

## 2015-11-09 ENCOUNTER — Ambulatory Visit (INDEPENDENT_AMBULATORY_CARE_PROVIDER_SITE_OTHER): Payer: Medicare Other | Admitting: Neurology

## 2015-11-09 ENCOUNTER — Inpatient Hospital Stay (HOSPITAL_COMMUNITY)
Admission: EM | Admit: 2015-11-09 | Discharge: 2015-11-13 | DRG: 100 | Disposition: A | Discharge status: Skilled Nursing Facility | Payer: Medicare Other | Attending: Internal Medicine | Admitting: Internal Medicine

## 2015-11-09 ENCOUNTER — Telehealth: Payer: Self-pay | Admitting: Neurology

## 2015-11-09 VITALS — BP 118/66 | HR 72 | Ht 66.0 in | Wt 143.0 lb

## 2015-11-09 DIAGNOSIS — Z9841 Cataract extraction status, right eye: Secondary | ICD-10-CM

## 2015-11-09 DIAGNOSIS — Z87891 Personal history of nicotine dependence: Secondary | ICD-10-CM

## 2015-11-09 DIAGNOSIS — F79 Unspecified intellectual disabilities: Secondary | ICD-10-CM

## 2015-11-09 DIAGNOSIS — G9341 Metabolic encephalopathy: Secondary | ICD-10-CM | POA: Diagnosis present

## 2015-11-09 DIAGNOSIS — G9340 Encephalopathy, unspecified: Secondary | ICD-10-CM | POA: Insufficient documentation

## 2015-11-09 DIAGNOSIS — W19XXXA Unspecified fall, initial encounter: Secondary | ICD-10-CM | POA: Diagnosis present

## 2015-11-09 DIAGNOSIS — G40409 Other generalized epilepsy and epileptic syndromes, not intractable, without status epilepticus: Principal | ICD-10-CM | POA: Diagnosis present

## 2015-11-09 DIAGNOSIS — G40909 Epilepsy, unspecified, not intractable, without status epilepticus: Secondary | ICD-10-CM

## 2015-11-09 DIAGNOSIS — G253 Myoclonus: Secondary | ICD-10-CM | POA: Insufficient documentation

## 2015-11-09 DIAGNOSIS — R296 Repeated falls: Secondary | ICD-10-CM

## 2015-11-09 DIAGNOSIS — R569 Unspecified convulsions: Secondary | ICD-10-CM

## 2015-11-09 DIAGNOSIS — Z79899 Other long term (current) drug therapy: Secondary | ICD-10-CM

## 2015-11-09 DIAGNOSIS — K219 Gastro-esophageal reflux disease without esophagitis: Secondary | ICD-10-CM | POA: Diagnosis present

## 2015-11-09 DIAGNOSIS — T1490 Injury, unspecified: Secondary | ICD-10-CM | POA: Diagnosis not present

## 2015-11-09 DIAGNOSIS — G9389 Other specified disorders of brain: Secondary | ICD-10-CM | POA: Diagnosis present

## 2015-11-09 DIAGNOSIS — W19XXXD Unspecified fall, subsequent encounter: Secondary | ICD-10-CM | POA: Diagnosis present

## 2015-11-09 DIAGNOSIS — S82891D Other fracture of right lower leg, subsequent encounter for closed fracture with routine healing: Secondary | ICD-10-CM

## 2015-11-09 DIAGNOSIS — Z9842 Cataract extraction status, left eye: Secondary | ICD-10-CM

## 2015-11-09 HISTORY — DX: Unspecified fall, initial encounter: W19.XXXA

## 2015-11-09 LAB — BASIC METABOLIC PANEL
Anion gap: 6 (ref 5–15)
BUN: 17 mg/dL (ref 6–20)
CO2: 27 mmol/L (ref 22–32)
Calcium: 9.8 mg/dL (ref 8.9–10.3)
Chloride: 105 mmol/L (ref 101–111)
Creatinine, Ser: 0.84 mg/dL (ref 0.44–1.00)
GFR calc Af Amer: >60 mL/min (ref >60)
GLUCOSE: 96 mg/dL (ref 65–99)
POTASSIUM: 3.9 mmol/L (ref 3.5–5.1)
Sodium: 138 mmol/L (ref 135–145)

## 2015-11-09 LAB — CBC
HEMATOCRIT: 36.1 % (ref 36.0–46.0)
HEMOGLOBIN: 11.5 g/dL — AB (ref 12.0–15.0)
MCH: 30.9 pg (ref 26.0–34.0)
MCHC: 31.9 g/dL (ref 30.0–36.0)
MCV: 97 fL (ref 78.0–100.0)
Platelets: 225 10*3/uL (ref 150–400)
RBC: 3.72 MIL/uL — ABNORMAL LOW (ref 3.87–5.11)
RDW: 13.3 % (ref 11.5–15.5)
WBC: 5.8 10*3/uL (ref 4.0–10.5)

## 2015-11-09 LAB — AMMONIA: AMMONIA: 29 umol/L (ref 9–35)

## 2015-11-09 LAB — VALPROIC ACID LEVEL: Valproic Acid Lvl: 101 ug/mL — ABNORMAL HIGH (ref 50.0–100.0)

## 2015-11-09 MED ORDER — ENOXAPARIN SODIUM 40 MG/0.4ML ~~LOC~~ SOLN
40.0000 mg | Freq: Every day | SUBCUTANEOUS | Status: DC
  Administered 2015-11-10 – 2015-11-13 (×4): 40 mg via SUBCUTANEOUS
  Filled 2015-11-09 (×4): qty 0.4

## 2015-11-09 MED ORDER — DEXTROSE-NACL 5-0.45 % IV SOLN
INTRAVENOUS | Status: DC
  Administered 2015-11-10 – 2015-11-12 (×6): via INTRAVENOUS
  Administered 2015-11-13: 1000 mL via INTRAVENOUS

## 2015-11-09 MED ORDER — DIVALPROEX SODIUM ER 500 MG PO TB24
500.0000 mg | ORAL_TABLET | Freq: Two times a day (BID) | ORAL | Status: DC
  Administered 2015-11-10 – 2015-11-13 (×8): 500 mg via ORAL
  Filled 2015-11-09 (×9): qty 1

## 2015-11-09 MED ORDER — SODIUM CHLORIDE 0.9 % IV BOLUS (SEPSIS)
500.0000 mL | Freq: Once | INTRAVENOUS | Status: AC
  Administered 2015-11-09: 500 mL via INTRAVENOUS

## 2015-11-09 MED ORDER — DIAZEPAM 2 MG PO TABS
2.0000 mg | ORAL_TABLET | Freq: Four times a day (QID) | ORAL | Status: DC | PRN
  Administered 2015-11-11 – 2015-11-12 (×4): 2 mg via ORAL
  Filled 2015-11-09 (×5): qty 1

## 2015-11-09 MED ORDER — LACOSAMIDE 200 MG PO TABS
200.0000 mg | ORAL_TABLET | Freq: Two times a day (BID) | ORAL | Status: DC
  Administered 2015-11-10 – 2015-11-13 (×8): 200 mg via ORAL
  Filled 2015-11-09 (×9): qty 1

## 2015-11-09 NOTE — Consult Note (Signed)
Admission H&P    Chief Complaint: Recurrent falls and seizures.  HPI: Maria [name] is an 55 y.o. female with a history of mental retardation and seizure disorder brought to the ED for evaluation of increased frequency of falling as well is recurrent seizure activity over the past few weeks. She suffered an ankle fracture with multiple falls and is currently wearing a brace on her right leg and foot. She is been falling multiple times per day. She is also had an increase in seizure frequency, including 2 witnessed seizures this morning. She is currently on Depakote 500 mg twice a day a.m. Vimpat 200 mg twice a day. Depakote level today was minimally supratherapeutic. She is at a max recommended dose of Vimpat. CT scan of the head yesterday showed no acute findings. Encephalomalacia in the inferior medial right temporal lobe was noted. C-spine CT scan was unremarkable. Patient was admitted for seizure control and physical therapy intervention as well as EEG to rule out possible martial seizure activity contributing to mental status changes.    Past Medical History  Diagnosis Date  . GERD (gastroesophageal reflux disease)   . Mental retardation     lesion in head  . Dysphagia   . Constipation   . Seizures ([place])     "not fully controlled on max doses of meds" (Neuro ofc note 12/2014)  . Thrombocytopenia ([place])   . Tremor     Past Surgical History  Procedure Laterality Date  . Total abdominal hysterectomy    . Tonsillectomy and adenoidectomy    . Skin graft to gum Right 08/2013  . Cataract extraction      both eyes, May of 2015  . Mouth surgery    . Esophagogastroduodenoscopy (egd) with propofol N/A 09/16/2014    Procedure: ESOPHAGOGASTRODUODENOSCOPY (EGD) WITH PROPOFOL;  Surgeon: Rogene Houston, MD;  Location: [facility];  Service: Endoscopy;  Laterality: N/A;  . Esophageal dilation N/A 09/16/2014    Procedure: ESOPHAGEAL DILATION WITH 54FR MALONEY DILATOR;  Surgeon: Rogene Houston, MD;   Location: [facility];  Service: Endoscopy;  Laterality: N/A;  . Biopsy  09/16/2014    Procedure: BIOPSY;  Surgeon: Rogene Houston, MD;  Location: [facility];  Service: Endoscopy;;    Family History  Problem Relation Age of Onset  . High Cholesterol Mother   . High blood pressure Mother   . Diabetes Father    Social History:  reports that she quit smoking about 9 years ago. Her smoking use included Cigarettes. She has a 22.5 pack-year smoking history. She has never used smokeless tobacco. She reports that she does not drink alcohol or use illicit drugs.  Allergies:  Allergies  Allergen Reactions  . Codeine Nausea And Vomiting  . Lamictal [Lamotrigine] Rash    Medications: Preadmission medications were reviewed by me.  ROS: As noted in present illness above. Noncontributory otherwise.  Physical Examination: Blood pressure 128/67, pulse 72, temperature 98.1 F (36.7 C), temperature source Oral, resp. rate 19, SpO2 100 %.  HEENT-  Normocephalic, no lesions, without obvious abnormality.  Normal external eye and conjunctiva.  Normal TM's bilaterally.  Normal auditory canals and external ears. Normal external nose, mucus membranes and septum.  Normal pharynx. Neck supple with no masses, nodes, nodules or enlargement. Cardiovascular - regular rate and rhythm, S1, S2 normal, no murmur, click, rub or gallop Lungs - chest clear, no wheezing, rales, normal symmetric air entry Abdomen - soft, non-tender; bowel sounds normal; no masses,  no organomegaly Extremities -  boot on right leg and foot  Neurologic Examination: Mental Status: Alert, oriented except for time, no acute distress.  Speech minimally slurred without evidence of aphasia. Able to follow commands without difficulty. Cranial Nerves: II-Visual fields were normal. III/IV/VI-Pupils were equal and reacted normally to light. Extraocular movements were full and conjugate.    V/VII-no facial numbness; equivocal mild left lower facial  weakness. VIII-normal. X-slight dysarthria symmetrical palatal movement. XI: trapezius strength/neck flexion strength normal bilaterally XII-midline tongue extension with normal strength. Motor: 5/5 bilaterally with normal tone and bulk Sensory: Normal throughout. Deep Tendon Reflexes: 1+ and symmetric. Plantars: Flexor on the left; not tested on the right. Cerebellar: Normal finger-to-nose testing except for mild intention tremor bilaterally. Carotid auscultation: Normal  Results for orders placed or performed during the hospital encounter of 11/09/15 (from the past 48 hour(s))  Basic metabolic panel - if new onset seizures     Status: None   Collection Time: 11/09/15  5:27 PM  Result Value Ref Range   Sodium 138 135 - 145 mmol/L   Potassium 3.9 3.5 - 5.1 mmol/L   Chloride 105 101 - 111 mmol/L   CO2 27 22 - 32 mmol/L   Glucose, Bld 96 65 - 99 mg/dL   BUN 17 6 - 20 mg/dL   Creatinine, Ser 0.84 0.44 - 1.00 mg/dL   Calcium 9.8 8.9 - 10.3 mg/dL   GFR calc non Af Amer >60 >60 mL/min   GFR calc Af Amer >60 >60 mL/min    Comment: (NOTE) The eGFR has been calculated using the CKD EPI equation. This calculation has not been validated in all clinical situations. eGFR's persistently <60 mL/min signify possible Chronic Kidney Disease.    Anion gap 6 5 - 15  Valproic Acid (depakote) Level (if patient is taking this medication)     Status: Abnormal   Collection Time: 11/09/15  5:27 PM  Result Value Ref Range   Valproic Acid Lvl 101 (H) 50.0 - 100.0 ug/mL  CBC - if new onset seizures     Status: Abnormal   Collection Time: 11/09/15  5:27 PM  Result Value Ref Range   WBC 5.8 4.0 - 10.5 K/uL   RBC 3.72 (L) 3.87 - 5.11 MIL/uL   Hemoglobin 11.5 (L) 12.0 - 15.0 g/dL   HCT 36.1 36.0 - 46.0 %   MCV 97.0 78.0 - 100.0 fL   MCH 30.9 26.0 - 34.0 pg   MCHC 31.9 30.0 - 36.0 g/dL   RDW 13.3 11.5 - 15.5 %   Platelets 225 150 - 400 K/uL  Ammonia     Status: None   Collection Time: 11/09/15  8:43  PM  Result Value Ref Range   Ammonia 29 9 - 35 umol/L   Dg Pelvis 1-2 Views  11/08/2015  CLINICAL DATA:  55 year old female status post fall today. Initial encounter. EXAM: PELVIS - 1-2 VIEW COMPARISON:  CT Abdomen and Pelvis 05/26/2015 and earlier. FINDINGS: Femoral heads are normally located. Chronic acetabular degenerative spurring is not significantly changed. Grossly intact proximal femurs. Pelvis bone mineralization is normal. Pelvis appears intact. Grossly intact sacral ala and SI joints. Negative visible bowel gas pattern. Chronic right hemipelvis phlebolith. IMPRESSION: No acute fracture or dislocation identified about the pelvis. Electronically Signed   By: Genevie Ann M.D.   On: 11/08/2015 15:42   Dg Ankle Complete Right  11/08/2015  CLINICAL DATA:  Twisting injury with pain of right ankle. Initial encounter. EXAM: RIGHT ANKLE - COMPLETE 3+ VIEW COMPARISON:  Right tibia and fibula films on 09/14/2009 FINDINGS: There are acute fractures of both the lateral and medial malleoli. Oblique fracture of the distal fibula shows mild displacement. Transverse fracture of the medial malleolus shows mild displacement along the medial cortex. The ankle mortise shows normal alignment. No posterior malleolar fracture identified. Diffuse soft tissue swelling identified. IMPRESSION: Mildly displaced fractures of both the right lateral and medial malleoli. Electronically Signed   By: Aletta Edouard M.D.   On: 11/08/2015 14:00   Ct Head Wo Contrast  11/08/2015  CLINICAL DATA:  Pain following fall EXAM: CT HEAD WITHOUT CONTRAST CT CERVICAL SPINE WITHOUT CONTRAST TECHNIQUE: Multidetector CT imaging of the head and cervical spine was performed following the standard protocol without intravenous contrast. Multiplanar CT image reconstructions of the cervical spine were also generated. COMPARISON:  CT head and CT cervical spine Oct 30, 2014 FINDINGS: CT HEAD FINDINGS There is mild stable supratentorial atrophy with moderate  cerebellar atrophy, stable. There is no intracranial mass, hemorrhage, extra-axial fluid collection, or midline shift. There is stable encephalomalacia in the medial inferior right frontal lobe, stable. Elsewhere gray-white compartments appear normal. No acute infarct evident. There is a right frontal scalp hematoma. The bony calvarium appears intact. A prominent venous [place] is noted in the right frontal bone, stable. There is opacification of several inferior mastoid air cells bilaterally. Most of the mastoid air cells are clear. No intraorbital lesions are evident. There is rightward deviation of the nasal septum. There is debris in the right external auditory canal. CT CERVICAL SPINE FINDINGS There is no fracture or spondylolisthesis. Prevertebral soft tissues and predental space regions are normal. There is moderate disc space narrowing at C5-6 and C6-7. There is slightly milder narrowing at all other levels. There is multilevel facet osteoarthritic change. There is exit foraminal narrowing due to bony hypertrophy at C3-4 bilaterally, C4-5 on the right, C5-6 bilaterally, and C6-7 on the left. There is impression on the exiting nerve roots at C4-5 on the right and C5-6 bilaterally. IMPRESSION: CT head: Atrophy, greatest in the cerebellar region. Encephalomalacia in the inferomedial right frontal lobe. No intracranial mass, hemorrhage, extra-axial fluid collection, or acute infarct. There is a right frontal scalp hematoma. Mild chronic mastoid disease bilaterally is stable. There is probable cerumen in the right external auditory canal. CT cervical spine: Acute no acute fracture or spondylolisthesis. Multilevel arthropathy. Impression on several exiting nerve roots is noted due to bony hypertrophy. Electronically Signed   By: Lowella Grip III M.D.   On: 11/08/2015 16:06   Ct Cervical Spine Wo Contrast  11/08/2015  CLINICAL DATA:  Pain following fall EXAM: CT HEAD WITHOUT CONTRAST CT CERVICAL SPINE WITHOUT  CONTRAST TECHNIQUE: Multidetector CT imaging of the head and cervical spine was performed following the standard protocol without intravenous contrast. Multiplanar CT image reconstructions of the cervical spine were also generated. COMPARISON:  CT head and CT cervical spine Oct 30, 2014 FINDINGS: CT HEAD FINDINGS There is mild stable supratentorial atrophy with moderate cerebellar atrophy, stable. There is no intracranial mass, hemorrhage, extra-axial fluid collection, or midline shift. There is stable encephalomalacia in the medial inferior right frontal lobe, stable. Elsewhere gray-white compartments appear normal. No acute infarct evident. There is a right frontal scalp hematoma. The bony calvarium appears intact. A prominent venous [place] is noted in the right frontal bone, stable. There is opacification of several inferior mastoid air cells bilaterally. Most of the mastoid air cells are clear. No intraorbital lesions are evident. There is rightward deviation  of the nasal septum. There is debris in the right external auditory canal. CT CERVICAL SPINE FINDINGS There is no fracture or spondylolisthesis. Prevertebral soft tissues and predental space regions are normal. There is moderate disc space narrowing at C5-6 and C6-7. There is slightly milder narrowing at all other levels. There is multilevel facet osteoarthritic change. There is exit foraminal narrowing due to bony hypertrophy at C3-4 bilaterally, C4-5 on the right, C5-6 bilaterally, and C6-7 on the left. There is impression on the exiting nerve roots at C4-5 on the right and C5-6 bilaterally. IMPRESSION: CT head: Atrophy, greatest in the cerebellar region. Encephalomalacia in the inferomedial right frontal lobe. No intracranial mass, hemorrhage, extra-axial fluid collection, or acute infarct. There is a right frontal scalp hematoma. Mild chronic mastoid disease bilaterally is stable. There is probable cerumen in the right external auditory canal. CT cervical  spine: Acute no acute fracture or spondylolisthesis. Multilevel arthropathy. Impression on several exiting nerve roots is noted due to bony hypertrophy. Electronically Signed   By: Lowella Grip III M.D.   On: 11/08/2015 16:06   Dg Knee Complete 4 Views Right  11/08/2015  CLINICAL DATA:  Status post fall today with a blow to the right knee. Pain. Initial encounter. EXAM: RIGHT KNEE - COMPLETE 4+ VIEW COMPARISON:  None. FINDINGS: No evidence of fracture, dislocation, or joint effusion. No evidence of arthropathy or other focal bone abnormality. Soft tissues are unremarkable. IMPRESSION: Negative exam. Electronically Signed   By: Inge Rise M.D.   On: 11/08/2015 13:55    Assessment/Plan 55 year old lady with seizure disorder and mental retardation with an increase in seizure frequency and recurrent falls over the past several weeks. Etiology is unclear. She does not appear to be sufficiently toxic on anticonvulsant medications, and is having recurrent seizure activity in spite of being maximal dose of Depakote and Vimpat.  Recommendations: 1. MRI of the brain without contrast 2. EEG routine adult study 3. Vimpat level 4. Physical therapy consultation for gait evaluation 5. Patient may need a third anticonvulsant medication is recurrent seizure activity continues  We will continue to follow this patient with you.  C.R. Nicole Kindred, [place] [facility] [phone]  11/09/2015, 11:28 PM

## 2015-11-09 NOTE — Telephone Encounter (Signed)
Mother Maria [name] called to advise, daughter is having breakthrough seizures, had sezure, fell and broke ankle 5/26 is in boot, fell 3 times more, the same way, 6/2, 6/7 and again today, went to ER yesterday because of fall, doubled her knee up and was swollen, had [place] Mal seizure lasting 3 seconds while lying on the floor this morning after she had fallen from other type of seizure, fell gently, bruised all over.

## 2015-11-09 NOTE — Telephone Encounter (Signed)
Patient's Mother is calling back adding to the message below to advise that the patient had an x-ray of her head and neck while at the ER yesterday. Her mother wants Dr. Jannifer Franklin to look at these x-rays to see if the lesion may have grown.

## 2015-11-09 NOTE — ED Notes (Addendum)
Patient stood and pivoted from stretcher to bedside commode with 1 assist. Patient's family reports pt has not had an appetite the last couple days and hasn't been drinking much.

## 2015-11-09 NOTE — ED Notes (Signed)
MD at bedside. 

## 2015-11-09 NOTE — Progress Notes (Signed)
Reason for visit: Seizures  Maria [name] is an 55 y.o. female  History of present illness:  Maria [name] is a 55 year old right-handed white female with a history of intractable seizures and mild mental retardation. The patient lives at home with her elderly mother. The patient was seen in the emergency room on 10/27/2015 following a fall, this was associated with a fracture of the right ankle. The patient has been on some Percocet for pain since that time. The patient has had some change in her functional level since that fall. The patient has fallen on 3 occasions over the last several days, the patient has had decreased intake of fluids. On the day of this evaluation, the patient has eaten only had one oatmeal cookie and a small amount of fluid to drink. She has become more confused, and she has had episodes of myoclonic jerking involving the arms and legs. The patient had a brief seizure on the morning of this evaluation after she fell on the floor at home. Her usual seizures are associated with jerking of the upper extremities and decreased responsive state. The patient usually has a 1 hour post ictal period of confusion where she may wander about the house after a seizure. The patient has required assistance with bathing and dressing recently because she has not been able to perform this task on her own. She is brought into the office for evaluation of the altered mental status, and a change in balance and multiple falls. The patient has undergone a recent CT scan of the head on her most recent ER visit on June 7. This study was unremarkable.  Past Medical History  Diagnosis Date  . GERD (gastroesophageal reflux disease)   . Mental retardation     lesion in head  . Dysphagia   . Constipation   . Seizures ([place])     "not fully controlled on max doses of meds" (Neuro ofc note 12/2014)  . Thrombocytopenia ([place])   . Tremor     Past Surgical History  Procedure Laterality Date  . Total  abdominal hysterectomy    . Tonsillectomy and adenoidectomy    . Skin graft to gum Right 08/2013  . Cataract extraction      both eyes, May of 2015  . Mouth surgery    . Esophagogastroduodenoscopy (egd) with propofol N/A 09/16/2014    Procedure: ESOPHAGOGASTRODUODENOSCOPY (EGD) WITH PROPOFOL;  Surgeon: Rogene Houston, MD;  Location: [facility];  Service: Endoscopy;  Laterality: N/A;  . Esophageal dilation N/A 09/16/2014    Procedure: ESOPHAGEAL DILATION WITH 54FR MALONEY DILATOR;  Surgeon: Rogene Houston, MD;  Location: [facility];  Service: Endoscopy;  Laterality: N/A;  . Biopsy  09/16/2014    Procedure: BIOPSY;  Surgeon: Rogene Houston, MD;  Location: [facility];  Service: Endoscopy;;    Family History  Problem Relation Age of Onset  . High Cholesterol Mother   . High blood pressure Mother   . Diabetes Father     Social history:  reports that she quit smoking about 9 years ago. Her smoking use included Cigarettes. She has a 22.5 pack-year smoking history. She has never used smokeless tobacco. She reports that she does not drink alcohol or use illicit drugs.    Allergies  Allergen Reactions  . Codeine Nausea And Vomiting  . Lamictal [Lamotrigine] Rash    Medications:  Prior to Admission medications   Medication Sig Start Date End Date Taking? Authorizing Provider  Calcium Carb-Cholecalciferol (CALCIUM  500 +D) 500-400 MG-UNIT TABS Take 1 tablet by mouth 2 (two) times daily.   Yes Historical Provider, MD  Calcium Carbonate Antacid (TUMS PO) Take 1 tablet by mouth as needed (heartburn).    Yes Historical Provider, MD  cholecalciferol (VITAMIN D) 1000 UNITS tablet Take 1,000 Units by mouth daily. Reported on 05/30/2015   Yes Historical Provider, MD  diazepam (VALIUM) 2 MG tablet Take 1 tablet (2 mg total) by mouth every 6 (six) hours as needed for anxiety. 11/03/15  Yes Kathrynn Ducking, MD  divalproex (DEPAKOTE ER) 500 MG 24 hr tablet TAKE 1 TABLET TWICE A DAY 06/01/15  Yes Dennie Bible,  NP  docusate sodium (COLACE) 100 MG capsule Take 100 mg by mouth daily.   Yes Historical Provider, MD  estradiol (VIVELLE-DOT) 0.075 MG/24HR Place 1 patch onto the skin 2 (two) times a week. On Wednesday and Saturday   Yes Historical Provider, MD  ibuprofen (ADVIL,MOTRIN) 600 MG tablet Take 1 tablet (600 mg total) by mouth every 6 (six) hours as needed for moderate pain. 01/15/15  Yes Evalee Jefferson, PA-C  lacosamide (VIMPAT) 200 MG TABS tablet Take 1 tablet (200 mg total) by mouth 2 (two) times daily. 06/06/15  Yes Kathrynn Ducking, MD  levocetirizine (XYZAL) 5 MG tablet Take 5 mg by mouth every evening.    Yes Historical Provider, MD  omeprazole (PRILOSEC) 40 MG capsule Take 1 capsule (40 mg total) by mouth daily. 09/21/15  Yes Butch Penny, NP  oxybutynin (DITROPAN-XL) 5 MG 24 hr tablet Take 5 mg by mouth daily.   Yes Historical Provider, MD  oxyCODONE-acetaminophen (PERCOCET) 5-325 MG tablet Take 1 tablet by mouth every 4 (four) hours as needed. 10/27/15  Yes Nat Christen, MD  Pediatric Multiple Vit-C-FA (PEDIATRIC MULTIVITAMIN) chewable tablet Chew 1 tablet by mouth daily.     Yes Historical Provider, MD  HYDROcodone-acetaminophen (NORCO/VICODIN) 5-325 MG tablet Reported on 11/09/2015 11/06/15   Historical Provider, MD    ROS:  Out of a complete 14 system review of symptoms, the patient complains only of the following symptoms, and all other reviewed systems are negative.  Decreased activity, decreased appetite Seizures, tremors, passing out Achy muscles Confusion  Blood pressure 118/66, pulse 72, height 5\' 6"  (1.676 m), weight 143 lb (64.864 kg).  Physical Exam  General: The patient is alert and cooperative at the time of the examination. The patient has difficulty following commands.  Skin: No significant peripheral edema is noted. The patient has a brace on the right ankle.   Neurologic Exam  Mental status: The patient is alert, will answer some questions, and has difficulty following  commands. Occasional myoclonic jerks are noted.   Cranial nerves: Facial symmetry is present. Speech is normal, no aphasia or dysarthria is noted. Extraocular movements are full. Visual fields are full. Pupils are equal, round, and reactive to light. Discs are flat bilaterally.  Motor: The patient has good strength in all 4 extremities, but the patient preferentially will use the right side, has difficulty performing tasks on the left, significant perseveration of speech and motor activities.  Sensory examination: Soft touch sensation is symmetric on the face, arms, and legs.  Coordination: The patient has good finger-nose-finger and heel-to-shin bilaterally. Severe apraxia with the use of the left arm is seen. No definite evidence of asterixis is seen.  Gait and station: The patient requires assistance with standing. The patient was not ambulated.  Reflexes: Deep tendon reflexes are symmetric.   CT brain and  cervical 11/08/15:  IMPRESSION: CT head: Atrophy, greatest in the cerebellar region. Encephalomalacia in the inferomedial right frontal lobe. No intracranial mass, hemorrhage, extra-axial fluid collection, or acute infarct. There is a right frontal scalp hematoma. Mild chronic mastoid disease bilaterally is stable. There is probable cerumen in the right external auditory canal.  CT cervical spine: Acute no acute fracture or spondylolisthesis. Multilevel arthropathy. Impression on several exiting nerve roots is noted due to bony hypertrophy.  * CT scan images were reviewed online. I agree with the written report.    Assessment/Plan:  1. History of intractable epilepsy  2. Multiple recent falls, right ankle fracture  3. Confusion, encephalopathy  The patient currently is living with an elderly mother, she has had a definite change in her functional level over the last 2 weeks, she has not been at her baseline since 10/27/2015. The patient appears to have increased  frequency of seizures, she now is having myoclonus. A recent Depakote level was 98. The patient has fallen on multiple occasions, she has fallen the morning of this evaluation and had a brief seizure. She appears to be encephalopathic during the examination today. I have recommended admission to the hospital for evaluation of the change in functional level, and the possibility of an ictal stupor needs to be considered. The patient has had very poor intake of food and fluids, and she will need therapy to ensure safety with walking. At the current time, she is at risk for further falls and injury, her elderly mother is not currently able to manage her needs at home. I would recommend an EEG study, blood work to include an ammonia level, she will need IV fluids. Physical therapy should evaluate for ambulation safety.  Jill Alexanders MD 11/09/2015 5:56 PM  [facility] [address] [place],  [address]  Phone [phone] Fax [phone]

## 2015-11-09 NOTE — ED Provider Notes (Signed)
CSN: [id number]     Arrival date & time 11/09/15  1716 History   First MD Initiated Contact with Patient 11/09/15 2021     Chief Complaint  Patient presents with  . Seizures    Patient is a 55 y.o. female presenting with general illness. The history is provided by the patient, medical records and a relative (Sister at bedside). The history is limited by the absence of a caregiver and a developmental delay (Mother is primary caregiver, not present; patient has MR).  Illness Location:  Generalized Quality:  Frequent falls, increased seizure frequency Severity:  Severe Onset quality:  Gradual Duration:  3 weeks Timing:  Intermittent Progression:  Waxing and waning Chronicity:  New Context:  Fell 3 weeks ago and broke her right ankle, has been wearing a boot since then Relieved by:  Nothing Worsened by:  None tried Ineffective treatments:  None tried Associated symptoms: no abdominal pain, no chest pain, no congestion, no fever, no nausea, no rash, no shortness of breath and no vomiting     Past Medical History  Diagnosis Date  . GERD (gastroesophageal reflux disease)   . Mental retardation     lesion in head  . Dysphagia   . Constipation   . Seizures ([place])     "not fully controlled on max doses of meds" (Neuro ofc note 12/2014)  . Thrombocytopenia ([place])   . Tremor    Past Surgical History  Procedure Laterality Date  . Total abdominal hysterectomy    . Tonsillectomy and adenoidectomy    . Skin graft to gum Right 08/2013  . Cataract extraction      both eyes, May of 2015  . Mouth surgery    . Esophagogastroduodenoscopy (egd) with propofol N/A 09/16/2014    Procedure: ESOPHAGOGASTRODUODENOSCOPY (EGD) WITH PROPOFOL;  Surgeon: Rogene Houston, MD;  Location: [facility];  Service: Endoscopy;  Laterality: N/A;  . Esophageal dilation N/A 09/16/2014    Procedure: ESOPHAGEAL DILATION WITH 54FR MALONEY DILATOR;  Surgeon: Rogene Houston, MD;  Location: [facility];  Service: Endoscopy;   Laterality: N/A;  . Biopsy  09/16/2014    Procedure: BIOPSY;  Surgeon: Rogene Houston, MD;  Location: [facility];  Service: Endoscopy;;   Family History  Problem Relation Age of Onset  . High Cholesterol Mother   . High blood pressure Mother   . Diabetes Father    Social History  Substance Use Topics  . Smoking status: Former Smoker -- 1.50 packs/day for 15 years    Types: Cigarettes    Quit date: 05/22/2006  . Smokeless tobacco: Never Used  . Alcohol Use: No   OB History    No data available     Review of Systems  Constitutional: Negative for fever and chills.  HENT: Negative for congestion.   Eyes: Negative for redness.  Respiratory: Negative for shortness of breath.   Cardiovascular: Negative for chest pain.  Gastrointestinal: Negative for nausea, vomiting and abdominal pain.  Genitourinary: Negative for flank pain.  Musculoskeletal: Positive for gait problem.  Skin: Negative for rash.  Neurological: Positive for seizures.      Allergies  Codeine and Lamictal  Home Medications   Prior to Admission medications   Medication Sig Start Date End Date Taking? Authorizing Provider  Calcium Carb-Cholecalciferol (CALCIUM 500 +D) 500-400 MG-UNIT TABS Take 1 tablet by mouth 2 (two) times daily.    Historical Provider, MD  Calcium Carbonate Antacid (TUMS PO) Take 1 tablet by mouth as needed (heartburn).  Historical Provider, MD  cholecalciferol (VITAMIN D) 1000 UNITS tablet Take 1,000 Units by mouth daily. Reported on 05/30/2015    Historical Provider, MD  diazepam (VALIUM) 2 MG tablet Take 1 tablet (2 mg total) by mouth every 6 (six) hours as needed for anxiety. 11/03/15   Kathrynn Ducking, MD  divalproex (DEPAKOTE ER) 500 MG 24 hr tablet TAKE 1 TABLET TWICE A DAY 06/01/15   Dennie Bible, NP  docusate sodium (COLACE) 100 MG capsule Take 100 mg by mouth daily.    Historical Provider, MD  estradiol (VIVELLE-DOT) 0.075 MG/24HR Place 1 patch onto the skin 2 (two) times a  week. On Wednesday and Saturday    Historical Provider, MD  HYDROcodone-acetaminophen (NORCO/VICODIN) 5-325 MG tablet Reported on 11/09/2015 11/06/15   Historical Provider, MD  ibuprofen (ADVIL,MOTRIN) 600 MG tablet Take 1 tablet (600 mg total) by mouth every 6 (six) hours as needed for moderate pain. 01/15/15   Evalee Jefferson, PA-C  lacosamide (VIMPAT) 200 MG TABS tablet Take 1 tablet (200 mg total) by mouth 2 (two) times daily. 06/06/15   Kathrynn Ducking, MD  levocetirizine (XYZAL) 5 MG tablet Take 5 mg by mouth every evening.     Historical Provider, MD  omeprazole (PRILOSEC) 40 MG capsule Take 1 capsule (40 mg total) by mouth daily. 09/21/15   Butch Penny, NP  oxybutynin (DITROPAN-XL) 5 MG 24 hr tablet Take 5 mg by mouth daily.    Historical Provider, MD  oxyCODONE-acetaminophen (PERCOCET) 5-325 MG tablet Take 1 tablet by mouth every 4 (four) hours as needed. 10/27/15   Nat Christen, MD  Pediatric Multiple Vit-C-FA (PEDIATRIC MULTIVITAMIN) chewable tablet Chew 1 tablet by mouth daily.      Historical Provider, MD   BP 109/74 mmHg  Pulse 68  Temp(Src) 98.1 F (36.7 C) (Oral)  Resp 11  SpO2 100% Physical Exam  Constitutional: She appears well-developed and well-nourished. No distress.  HENT:  Head: Normocephalic and atraumatic.  Right Ear: External ear normal.  Left Ear: External ear normal.  Nose: Nose normal.  Eyes: Pupils are equal, round, and reactive to light.  Neck: Normal range of motion.  Cardiovascular: Normal rate and regular rhythm.   Pulmonary/Chest: Effort normal and breath sounds normal. No respiratory distress.  Abdominal: Soft. She exhibits no distension. There is no tenderness.  Neurological: She is alert.  Has a difficult time following commands secondary to intellectual difficulties, however within that limitation, she has normal strength in the major muscle groups of upper and lower extremities, face symmetric, speech clear, PERRL  Skin: Skin is warm and dry. She is not  diaphoretic. No pallor.  Vitals reviewed.   ED Course  Procedures (including critical care time) Labs Review Labs Reviewed  VALPROIC ACID LEVEL - Abnormal; Notable for the following:    Valproic Acid Lvl 101 (*)    All other components within normal limits  CBC - Abnormal; Notable for the following:    RBC 3.72 (*)    Hemoglobin 11.5 (*)    All other components within normal limits  BASIC METABOLIC PANEL  AMMONIA    Imaging Review Dg Pelvis 1-2 Views  11/08/2015  CLINICAL DATA:  55 year old female status post fall today. Initial encounter. EXAM: PELVIS - 1-2 VIEW COMPARISON:  CT Abdomen and Pelvis 05/26/2015 and earlier. FINDINGS: Femoral heads are normally located. Chronic acetabular degenerative spurring is not significantly changed. Grossly intact proximal femurs. Pelvis bone mineralization is normal. Pelvis appears intact. Grossly intact sacral ala and  SI joints. Negative visible bowel gas pattern. Chronic right hemipelvis phlebolith. IMPRESSION: No acute fracture or dislocation identified about the pelvis. Electronically Signed   By: Genevie  M.D.   On: 11/08/2015 15:42   Dg Ankle Complete Right  11/08/2015  CLINICAL DATA:  Twisting injury with pain of right ankle. Initial encounter. EXAM: RIGHT ANKLE - COMPLETE 3+ VIEW COMPARISON:  Right tibia and fibula films on 09/14/2009 FINDINGS: There are acute fractures of both the lateral and medial malleoli. Oblique fracture of the distal fibula shows mild displacement. Transverse fracture of the medial malleolus shows mild displacement along the medial cortex. The ankle mortise shows normal alignment. No posterior malleolar fracture identified. Diffuse soft tissue swelling identified. IMPRESSION: Mildly displaced fractures of both the right lateral and medial malleoli. Electronically Signed   By: Aletta Edouard M.D.   On: 11/08/2015 14:00   Ct Head Wo Contrast  11/08/2015  CLINICAL DATA:  Pain following fall EXAM: CT HEAD WITHOUT CONTRAST CT  CERVICAL SPINE WITHOUT CONTRAST TECHNIQUE: Multidetector CT imaging of the head and cervical spine was performed following the standard protocol without intravenous contrast. Multiplanar CT image reconstructions of the cervical spine were also generated. COMPARISON:  CT head and CT cervical spine Oct 30, 2014 FINDINGS: CT HEAD FINDINGS There is mild stable supratentorial atrophy with moderate cerebellar atrophy, stable. There is no intracranial mass, hemorrhage, extra-axial fluid collection, or midline shift. There is stable encephalomalacia in the medial inferior right frontal lobe, stable. Elsewhere gray-white compartments appear normal. No acute infarct evident. There is a right frontal scalp hematoma. The bony calvarium appears intact. A prominent venous [place] is noted in the right frontal bone, stable. There is opacification of several inferior mastoid air cells bilaterally. Most of the mastoid air cells are clear. No intraorbital lesions are evident. There is rightward deviation of the nasal septum. There is debris in the right external auditory canal. CT CERVICAL SPINE FINDINGS There is no fracture or spondylolisthesis. Prevertebral soft tissues and predental space regions are normal. There is moderate disc space narrowing at C5-6 and C6-7. There is slightly milder narrowing at all other levels. There is multilevel facet osteoarthritic change. There is exit foraminal narrowing due to bony hypertrophy at C3-4 bilaterally, C4-5 on the right, C5-6 bilaterally, and C6-7 on the left. There is impression on the exiting nerve roots at C4-5 on the right and C5-6 bilaterally. IMPRESSION: CT head: Atrophy, greatest in the cerebellar region. Encephalomalacia in the inferomedial right frontal lobe. No intracranial mass, hemorrhage, extra-axial fluid collection, or acute infarct. There is a right frontal scalp hematoma. Mild chronic mastoid disease bilaterally is stable. There is probable cerumen in the right external  auditory canal. CT cervical spine: Acute no acute fracture or spondylolisthesis. Multilevel arthropathy. Impression on several exiting nerve roots is noted due to bony hypertrophy. Electronically Signed   By: Lowella Grip III M.D.   On: 11/08/2015 16:06   Ct Cervical Spine Wo Contrast  11/08/2015  CLINICAL DATA:  Pain following fall EXAM: CT HEAD WITHOUT CONTRAST CT CERVICAL SPINE WITHOUT CONTRAST TECHNIQUE: Multidetector CT imaging of the head and cervical spine was performed following the standard protocol without intravenous contrast. Multiplanar CT image reconstructions of the cervical spine were also generated. COMPARISON:  CT head and CT cervical spine Oct 30, 2014 FINDINGS: CT HEAD FINDINGS There is mild stable supratentorial atrophy with moderate cerebellar atrophy, stable. There is no intracranial mass, hemorrhage, extra-axial fluid collection, or midline shift. There is stable encephalomalacia in the medial  inferior right frontal lobe, stable. Elsewhere gray-white compartments appear normal. No acute infarct evident. There is a right frontal scalp hematoma. The bony calvarium appears intact. A prominent venous [place] is noted in the right frontal bone, stable. There is opacification of several inferior mastoid air cells bilaterally. Most of the mastoid air cells are clear. No intraorbital lesions are evident. There is rightward deviation of the nasal septum. There is debris in the right external auditory canal. CT CERVICAL SPINE FINDINGS There is no fracture or spondylolisthesis. Prevertebral soft tissues and predental space regions are normal. There is moderate disc space narrowing at C5-6 and C6-7. There is slightly milder narrowing at all other levels. There is multilevel facet osteoarthritic change. There is exit foraminal narrowing due to bony hypertrophy at C3-4 bilaterally, C4-5 on the right, C5-6 bilaterally, and C6-7 on the left. There is impression on the exiting nerve roots at C4-5 on the  right and C5-6 bilaterally. IMPRESSION: CT head: Atrophy, greatest in the cerebellar region. Encephalomalacia in the inferomedial right frontal lobe. No intracranial mass, hemorrhage, extra-axial fluid collection, or acute infarct. There is a right frontal scalp hematoma. Mild chronic mastoid disease bilaterally is stable. There is probable cerumen in the right external auditory canal. CT cervical spine: Acute no acute fracture or spondylolisthesis. Multilevel arthropathy. Impression on several exiting nerve roots is noted due to bony hypertrophy. Electronically Signed   By: Lowella Grip III M.D.   On: 11/08/2015 16:06   Dg Knee Complete 4 Views Right  11/08/2015  CLINICAL DATA:  Status post fall today with a blow to the right knee. Pain. Initial encounter. EXAM: RIGHT KNEE - COMPLETE 4+ VIEW COMPARISON:  None. FINDINGS: No evidence of fracture, dislocation, or joint effusion. No evidence of arthropathy or other focal bone abnormality. Soft tissues are unremarkable. IMPRESSION: Negative exam. Electronically Signed   By: Inge Rise M.D.   On: 11/08/2015 13:55   I have personally reviewed and evaluated these images and lab results as part of my medical decision-making.   EKG Interpretation None      MDM   Final diagnoses:  Frequent falls  Myoclonus    55 y.o. female with a History of mental retardation, seizures presents to the ED due to frequent falls and increased seizure frequency. She was seen by her neurologist today who recommended she come to the ED to be admitted. He noted that she appeared more confused than baseline and has had increased seizures and falls recently. Remainder of history of present illness, review of systems, and exam as above.  History of present illness, review of systems, and exam are all limited by the patient's cognitive difficulties and absence of her primary caregiver, her mother.  Labs reviewed by me. Normal ammonia level, unremarkable BMP, valproic  acid level mildly elevated at 101, CBC unremarkable.  Given that she is falling and having increased seizure frequency, her mother does not feel she can care for her at home right now. I discussed her case with the hospitalist and with the neurologist. She was admitted to hospitalist with neurology consulting.  Case managed in conjunction with my attending, Dr. Roxanne Mins.    Berenice Primas, MD [id number]  Delora Fuel, MD [id number]

## 2015-11-09 NOTE — Telephone Encounter (Signed)
Returned call to pt's mother. Work-in appt scheduled for this afternoon @ 3:30. Agreed to arrive 15 min prior to appt time.

## 2015-11-09 NOTE — H&P (Signed)
History and Physical  Maria [name] [id number] DOB: [birth year] DOA: 11/09/2015  PCP:  [place]   Chief Complaint:  Recurrent falls     History of Present Illness:  Patient is a 55 yo female with history of mental retardation, epilepsy who was brought by family with cc of recurrent falls in the past few weeks. She fell last month and broke her ankle, was sent home with a boot that she was wearing daily with no physical therapy but she continued to have falls : about 4 times with noticeable decline in her motor function and mentation along with "tremors" that are likely myoclonic movement. These are followed sometimes by loss of consciousness as she fell to the ground but without incontinence and did not seem similar to her previous seizure episodes with variable post ictal periods. She was seen by her neurologist today who documented her decline mentation (encephalopathy) and possible recurrent seizures. He recommended admitting with EEG and checking ammonia level.    Review of Systems: Patient is poor historian and ROS is taken with help from sister.  CONSTITUTIONAL:     No night sweats.  No fatigue.  No fever. No chills. Eyes:                            No visual changes.  No eye pain.  No eye discharge.   ENT:                              No epistaxis.  No sinus pain.  No sore throat.   No congestion. RESPIRATORY:           No cough.  No wheeze.  No hemoptysis.  No dyspnea CARDIOVASCULAR   :  No chest pains.  No palpitations. GASTROINTESTINAL:  No abdominal pain.  No nausea. No vomiting.  No diarrhea. No constipation.  No hematemesis.  No hematochezia.  No melena. GENITOURINARY:      No urgency.  No frequency.  No dysuria.  No hematuria.  No obstructive symptoms.  No discharge.  No pain.   MUSCULOSKELETAL:  +musculoskeletal pain.  No joint swelling.  No arthritis. NEUROLOGICAL:        +confusion.  +weakness. No headache. +seizure. PSYCHIATRIC:              No depression. No anxiety. No suicidal ideation. SKIN:                             No rashes.  No lesions.  No wounds. ENDOCRINE:                No weight loss.  No polydipsia.  No polyuria.  No polyphagia. HEMATOLOGIC:           No purpura.  No petechiae.  No bleeding.  ALLERGIC                 : No pruritus.  No angioedema Other:  Past Medical and Surgical History:   Past Medical History  Diagnosis Date  . GERD (gastroesophageal reflux disease)   . Mental retardation     lesion in head  . Dysphagia   . Constipation   . Seizures ([place])     "not fully controlled on max doses of meds" (Neuro ofc note 12/2014)  . Thrombocytopenia ([place])   . Tremor  Past Surgical History  Procedure Laterality Date  . Total abdominal hysterectomy    . Tonsillectomy and adenoidectomy    . Skin graft to gum Right 08/2013  . Cataract extraction      both eyes, May of 2015  . Mouth surgery    . Esophagogastroduodenoscopy (egd) with propofol N/A 09/16/2014    Procedure: ESOPHAGOGASTRODUODENOSCOPY (EGD) WITH PROPOFOL;  Surgeon: Rogene Houston, MD;  Location: [facility];  Service: Endoscopy;  Laterality: N/A;  . Esophageal dilation N/A 09/16/2014    Procedure: ESOPHAGEAL DILATION WITH 54FR MALONEY DILATOR;  Surgeon: Rogene Houston, MD;  Location: [facility];  Service: Endoscopy;  Laterality: N/A;  . Biopsy  09/16/2014    Procedure: BIOPSY;  Surgeon: Rogene Houston, MD;  Location: [facility];  Service: Endoscopy;;    Social History:   reports that she quit smoking about 9 years ago. Her smoking use included Cigarettes. She has a 22.5 pack-year smoking history. She has never used smokeless tobacco. She reports that she does not drink alcohol or use illicit drugs.    Allergies  Allergen Reactions  . Codeine Nausea And Vomiting  . Lamictal [Lamotrigine] Rash    Family History  Problem Relation Age of Onset  . High Cholesterol Mother   . High blood pressure Mother   . Diabetes Father       Prior to  Admission medications   Medication Sig Start Date End Date Taking? Authorizing Provider  Calcium Carb-Cholecalciferol (CALCIUM 500 +D) 500-400 MG-UNIT TABS Take 1 tablet by mouth 2 (two) times daily.    Historical Provider, MD  Calcium Carbonate Antacid (TUMS PO) Take 1 tablet by mouth as needed (heartburn).     Historical Provider, MD  cholecalciferol (VITAMIN D) 1000 UNITS tablet Take 1,000 Units by mouth daily. Reported on 05/30/2015    Historical Provider, MD  diazepam (VALIUM) 2 MG tablet Take 1 tablet (2 mg total) by mouth every 6 (six) hours as needed for anxiety. 11/03/15   Kathrynn Ducking, MD  divalproex (DEPAKOTE ER) 500 MG 24 hr tablet TAKE 1 TABLET TWICE A DAY 06/01/15   Dennie Bible, NP  docusate sodium (COLACE) 100 MG capsule Take 100 mg by mouth daily.    Historical Provider, MD  estradiol (VIVELLE-DOT) 0.075 MG/24HR Place 1 patch onto the skin 2 (two) times a week. On Wednesday and Saturday    Historical Provider, MD  HYDROcodone-acetaminophen (NORCO/VICODIN) 5-325 MG tablet Reported on 11/09/2015 11/06/15   Historical Provider, MD  ibuprofen (ADVIL,MOTRIN) 600 MG tablet Take 1 tablet (600 mg total) by mouth every 6 (six) hours as needed for moderate pain. 01/15/15   Evalee Jefferson, PA-C  lacosamide (VIMPAT) 200 MG TABS tablet Take 1 tablet (200 mg total) by mouth 2 (two) times daily. 06/06/15   Kathrynn Ducking, MD  levocetirizine (XYZAL) 5 MG tablet Take 5 mg by mouth every evening.     Historical Provider, MD  omeprazole (PRILOSEC) 40 MG capsule Take 1 capsule (40 mg total) by mouth daily. 09/21/15   Butch Penny, NP  oxybutynin (DITROPAN-XL) 5 MG 24 hr tablet Take 5 mg by mouth daily.    Historical Provider, MD  oxyCODONE-acetaminophen (PERCOCET) 5-325 MG tablet Take 1 tablet by mouth every 4 (four) hours as needed. 10/27/15   Nat Christen, MD  Pediatric Multiple Vit-C-FA (PEDIATRIC MULTIVITAMIN) chewable tablet Chew 1 tablet by mouth daily.      Historical Provider, MD    Physical  Exam: BP 114/71 mmHg  Pulse 71  Temp(Src) 98.1 F (36.7 C) (Oral)  Resp 15  SpO2 100%  GENERAL :   Alert and cooperative, and appears to be in no acute distress. HEAD:           normocephalic. EYES:            PERRL, EOMI.   EARS:           hearing grossly intact. NOSE:           No nasal discharge. THROAT:     Oral cavity and pharynx normal.   NECK:          supple, non-tender.  CARDIAC:    Normal S1 and S2. No gallop. No murmurs.  Vascular:     no peripheral edema.  LUNGS:       Clear to auscultation  ABDOMEN: Positive bowel sounds. Soft, nondistended, nontender. No guarding or rebound.      MSK:           No joint erythema or tenderness.  EXT           : right boot around right ankle.  Neuro        : Alert, oriented to person, place, and time.                      Patient did not follow commands to check III/IV/VI. XII intact. V intact. VII intact.                       Strength and sensation symmetric and intact throughout.                       SKIN:            No rash. No lesions.           Labs on Admission:  Reviewed.   Radiological Exams on Admission: Dg Pelvis 1-2 Views  11/08/2015  CLINICAL DATA:  55 year old female status post fall today. Initial encounter. EXAM: PELVIS - 1-2 VIEW COMPARISON:  CT Abdomen and Pelvis 05/26/2015 and earlier. FINDINGS: Femoral heads are normally located. Chronic acetabular degenerative spurring is not significantly changed. Grossly intact proximal femurs. Pelvis bone mineralization is normal. Pelvis appears intact. Grossly intact sacral ala and SI joints. Negative visible bowel gas pattern. Chronic right hemipelvis phlebolith. IMPRESSION: No acute fracture or dislocation identified about the pelvis. Electronically Signed   By: Genevie Ann M.D.   On: 11/08/2015 15:42   Dg Ankle Complete Right  11/08/2015  CLINICAL DATA:  Twisting injury with pain of right ankle. Initial encounter. EXAM: RIGHT ANKLE - COMPLETE 3+ VIEW COMPARISON:  Right tibia  and fibula films on 09/14/2009 FINDINGS: There are acute fractures of both the lateral and medial malleoli. Oblique fracture of the distal fibula shows mild displacement. Transverse fracture of the medial malleolus shows mild displacement along the medial cortex. The ankle mortise shows normal alignment. No posterior malleolar fracture identified. Diffuse soft tissue swelling identified. IMPRESSION: Mildly displaced fractures of both the right lateral and medial malleoli. Electronically Signed   By: Aletta Edouard M.D.   On: 11/08/2015 14:00   Ct Head Wo Contrast  11/08/2015  CLINICAL DATA:  Pain following fall EXAM: CT HEAD WITHOUT CONTRAST CT CERVICAL SPINE WITHOUT CONTRAST TECHNIQUE: Multidetector CT imaging of the head and cervical spine was performed following the standard protocol without intravenous contrast. Multiplanar CT image reconstructions of the cervical spine were  also generated. COMPARISON:  CT head and CT cervical spine Oct 30, 2014 FINDINGS: CT HEAD FINDINGS There is mild stable supratentorial atrophy with moderate cerebellar atrophy, stable. There is no intracranial mass, hemorrhage, extra-axial fluid collection, or midline shift. There is stable encephalomalacia in the medial inferior right frontal lobe, stable. Elsewhere gray-white compartments appear normal. No acute infarct evident. There is a right frontal scalp hematoma. The bony calvarium appears intact. A prominent venous [place] is noted in the right frontal bone, stable. There is opacification of several inferior mastoid air cells bilaterally. Most of the mastoid air cells are clear. No intraorbital lesions are evident. There is rightward deviation of the nasal septum. There is debris in the right external auditory canal. CT CERVICAL SPINE FINDINGS There is no fracture or spondylolisthesis. Prevertebral soft tissues and predental space regions are normal. There is moderate disc space narrowing at C5-6 and C6-7. There is slightly milder  narrowing at all other levels. There is multilevel facet osteoarthritic change. There is exit foraminal narrowing due to bony hypertrophy at C3-4 bilaterally, C4-5 on the right, C5-6 bilaterally, and C6-7 on the left. There is impression on the exiting nerve roots at C4-5 on the right and C5-6 bilaterally. IMPRESSION: CT head: Atrophy, greatest in the cerebellar region. Encephalomalacia in the inferomedial right frontal lobe. No intracranial mass, hemorrhage, extra-axial fluid collection, or acute infarct. There is a right frontal scalp hematoma. Mild chronic mastoid disease bilaterally is stable. There is probable cerumen in the right external auditory canal. CT cervical spine: Acute no acute fracture or spondylolisthesis. Multilevel arthropathy. Impression on several exiting nerve roots is noted due to bony hypertrophy. Electronically Signed   By: Lowella Grip III M.D.   On: 11/08/2015 16:06   Ct Cervical Spine Wo Contrast  11/08/2015  CLINICAL DATA:  Pain following fall EXAM: CT HEAD WITHOUT CONTRAST CT CERVICAL SPINE WITHOUT CONTRAST TECHNIQUE: Multidetector CT imaging of the head and cervical spine was performed following the standard protocol without intravenous contrast. Multiplanar CT image reconstructions of the cervical spine were also generated. COMPARISON:  CT head and CT cervical spine Oct 30, 2014 FINDINGS: CT HEAD FINDINGS There is mild stable supratentorial atrophy with moderate cerebellar atrophy, stable. There is no intracranial mass, hemorrhage, extra-axial fluid collection, or midline shift. There is stable encephalomalacia in the medial inferior right frontal lobe, stable. Elsewhere gray-white compartments appear normal. No acute infarct evident. There is a right frontal scalp hematoma. The bony calvarium appears intact. A prominent venous [place] is noted in the right frontal bone, stable. There is opacification of several inferior mastoid air cells bilaterally. Most of the mastoid air  cells are clear. No intraorbital lesions are evident. There is rightward deviation of the nasal septum. There is debris in the right external auditory canal. CT CERVICAL SPINE FINDINGS There is no fracture or spondylolisthesis. Prevertebral soft tissues and predental space regions are normal. There is moderate disc space narrowing at C5-6 and C6-7. There is slightly milder narrowing at all other levels. There is multilevel facet osteoarthritic change. There is exit foraminal narrowing due to bony hypertrophy at C3-4 bilaterally, C4-5 on the right, C5-6 bilaterally, and C6-7 on the left. There is impression on the exiting nerve roots at C4-5 on the right and C5-6 bilaterally. IMPRESSION: CT head: Atrophy, greatest in the cerebellar region. Encephalomalacia in the inferomedial right frontal lobe. No intracranial mass, hemorrhage, extra-axial fluid collection, or acute infarct. There is a right frontal scalp hematoma. Mild chronic mastoid disease bilaterally is stable.  There is probable cerumen in the right external auditory canal. CT cervical spine: Acute no acute fracture or spondylolisthesis. Multilevel arthropathy. Impression on several exiting nerve roots is noted due to bony hypertrophy. Electronically Signed   By: Lowella Grip III M.D.   On: 11/08/2015 16:06   Dg Knee Complete 4 Views Right  11/08/2015  CLINICAL DATA:  Status post fall today with a blow to the right knee. Pain. Initial encounter. EXAM: RIGHT KNEE - COMPLETE 4+ VIEW COMPARISON:  None. FINDINGS: No evidence of fracture, dislocation, or joint effusion. No evidence of arthropathy or other focal bone abnormality. Soft tissues are unremarkable. IMPRESSION: Negative exam. Electronically Signed   By: Inge Rise M.D.   On: 11/08/2015 13:55     Assessment/Plan  Altered mental status: Baseline: mental retardation  Ddx:   Neurologic:           Structural : head CT scan with atrophy           Stroke / TIA: will check MRI           Seizure: will check EEG Meds/Toxins:          Iatrogenic: drugs ( UDS / BDS) vs. Polypharmacy: checking depakote level Infectious: unlikely  Metabolic : ammonia level to be checked  Epilepsy:  Continue home meds Check depakote level EEG in am Neurology consult   Right ankle fx: Norco prn pain. PT consult in am.                             Input & Output: NA Lines & Tubes: PIV DVT prophylaxis: [place] enoxaparin  GI prophylaxis:NA Consultants: Neuro Code Status: Full  Family Communication: sister at bedside Disposition Plan: Obs.     Gennaro Africa M.D [facility]

## 2015-11-09 NOTE — ED Notes (Signed)
Pt here with increased seizures since a fall a month ago. Sent here by her neurologist for admission. Per family she has been falling a lot.

## 2015-11-09 NOTE — ED Notes (Signed)
Provided patient with graham crackers and ice water per MD.

## 2015-11-10 ENCOUNTER — Observation Stay (HOSPITAL_COMMUNITY): Payer: Medicare Other

## 2015-11-10 ENCOUNTER — Inpatient Hospital Stay (HOSPITAL_COMMUNITY): Payer: Medicare Other

## 2015-11-10 DIAGNOSIS — G253 Myoclonus: Secondary | ICD-10-CM | POA: Insufficient documentation

## 2015-11-10 DIAGNOSIS — F445 Conversion disorder with seizures or convulsions: Secondary | ICD-10-CM | POA: Diagnosis not present

## 2015-11-10 DIAGNOSIS — Z9842 Cataract extraction status, left eye: Secondary | ICD-10-CM | POA: Diagnosis not present

## 2015-11-10 DIAGNOSIS — W19XXXD Unspecified fall, subsequent encounter: Secondary | ICD-10-CM | POA: Diagnosis present

## 2015-11-10 DIAGNOSIS — K219 Gastro-esophageal reflux disease without esophagitis: Secondary | ICD-10-CM | POA: Diagnosis present

## 2015-11-10 DIAGNOSIS — G40401 Other generalized epilepsy and epileptic syndromes, not intractable, with status epilepticus: Secondary | ICD-10-CM | POA: Diagnosis not present

## 2015-11-10 DIAGNOSIS — R279 Unspecified lack of coordination: Secondary | ICD-10-CM | POA: Diagnosis not present

## 2015-11-10 DIAGNOSIS — Z9841 Cataract extraction status, right eye: Secondary | ICD-10-CM | POA: Diagnosis not present

## 2015-11-10 DIAGNOSIS — R2689 Other abnormalities of gait and mobility: Secondary | ICD-10-CM | POA: Diagnosis not present

## 2015-11-10 DIAGNOSIS — M6281 Muscle weakness (generalized): Secondary | ICD-10-CM | POA: Diagnosis not present

## 2015-11-10 DIAGNOSIS — G9340 Encephalopathy, unspecified: Secondary | ICD-10-CM | POA: Diagnosis not present

## 2015-11-10 DIAGNOSIS — G40409 Other generalized epilepsy and epileptic syndromes, not intractable, without status epilepticus: Secondary | ICD-10-CM | POA: Diagnosis present

## 2015-11-10 DIAGNOSIS — Z79899 Other long term (current) drug therapy: Secondary | ICD-10-CM | POA: Diagnosis not present

## 2015-11-10 DIAGNOSIS — R569 Unspecified convulsions: Secondary | ICD-10-CM | POA: Diagnosis not present

## 2015-11-10 DIAGNOSIS — R1310 Dysphagia, unspecified: Secondary | ICD-10-CM | POA: Diagnosis not present

## 2015-11-10 DIAGNOSIS — Z87891 Personal history of nicotine dependence: Secondary | ICD-10-CM | POA: Diagnosis not present

## 2015-11-10 DIAGNOSIS — F79 Unspecified intellectual disabilities: Secondary | ICD-10-CM | POA: Diagnosis present

## 2015-11-10 DIAGNOSIS — R296 Repeated falls: Secondary | ICD-10-CM | POA: Diagnosis not present

## 2015-11-10 DIAGNOSIS — G9389 Other specified disorders of brain: Secondary | ICD-10-CM | POA: Diagnosis present

## 2015-11-10 DIAGNOSIS — M25571 Pain in right ankle and joints of right foot: Secondary | ICD-10-CM | POA: Diagnosis not present

## 2015-11-10 DIAGNOSIS — G9349 Other encephalopathy: Secondary | ICD-10-CM | POA: Diagnosis not present

## 2015-11-10 DIAGNOSIS — W19XXXS Unspecified fall, sequela: Secondary | ICD-10-CM | POA: Diagnosis not present

## 2015-11-10 DIAGNOSIS — R41841 Cognitive communication deficit: Secondary | ICD-10-CM | POA: Diagnosis not present

## 2015-11-10 DIAGNOSIS — S82891D Other fracture of right lower leg, subsequent encounter for closed fracture with routine healing: Secondary | ICD-10-CM | POA: Diagnosis not present

## 2015-11-10 DIAGNOSIS — G9341 Metabolic encephalopathy: Secondary | ICD-10-CM | POA: Diagnosis present

## 2015-11-10 DIAGNOSIS — F419 Anxiety disorder, unspecified: Secondary | ICD-10-CM | POA: Diagnosis not present

## 2015-11-10 LAB — ALBUMIN: ALBUMIN: 3.5 g/dL (ref 3.5–5.0)

## 2015-11-10 MED ORDER — LORAZEPAM 2 MG/ML IJ SOLN
1.0000 mg | Freq: Four times a day (QID) | INTRAMUSCULAR | Status: DC | PRN
  Administered 2015-11-10: 1 mg via INTRAVENOUS

## 2015-11-10 MED ORDER — ENSURE ENLIVE PO LIQD
237.0000 mL | Freq: Two times a day (BID) | ORAL | Status: DC
  Administered 2015-11-10 – 2015-11-13 (×4): 237 mL via ORAL

## 2015-11-10 MED ORDER — OXYCODONE-ACETAMINOPHEN 5-325 MG PO TABS
1.0000 | ORAL_TABLET | ORAL | Status: DC | PRN
  Administered 2015-11-10 – 2015-11-13 (×9): 1 via ORAL
  Filled 2015-11-10 (×10): qty 1

## 2015-11-10 MED ORDER — LORAZEPAM 2 MG/ML IJ SOLN
INTRAMUSCULAR | Status: AC
  Administered 2015-11-10: 1 mg via INTRAVENOUS
  Filled 2015-11-10: qty 1

## 2015-11-10 MED ORDER — LORAZEPAM 2 MG/ML IJ SOLN: 1.0000 mg | Freq: Once | INTRAMUSCULAR | Status: DC

## 2015-11-10 MED ORDER — MORPHINE SULFATE (PF) 2 MG/ML IV SOLN
2.0000 mg | Freq: Once | INTRAVENOUS | Status: AC
  Administered 2015-11-10: 2 mg via INTRAVENOUS
  Filled 2015-11-10: qty 1

## 2015-11-10 MED ORDER — LEVETIRACETAM 500 MG PO TABS
500.0000 mg | ORAL_TABLET | Freq: Two times a day (BID) | ORAL | Status: DC
Start: 2015-11-10 — End: 2015-11-13
  Administered 2015-11-10 – 2015-11-13 (×6): 500 mg via ORAL
  Filled 2015-11-10 (×6): qty 1

## 2015-11-10 MED ORDER — LEVETIRACETAM 500 MG/5ML IV SOLN
1000.0000 mg | Freq: Once | INTRAVENOUS | Status: AC
  Administered 2015-11-10: 1000 mg via INTRAVENOUS
  Filled 2015-11-10: qty 10

## 2015-11-10 MED ORDER — CETYLPYRIDINIUM CHLORIDE 0.05 % MT LIQD
7.0000 mL | Freq: Two times a day (BID) | OROMUCOSAL | Status: DC
  Administered 2015-11-10 – 2015-11-13 (×6): 7 mL via OROMUCOSAL

## 2015-11-10 NOTE — Progress Notes (Addendum)
Patient ID: Maria [name], female   DOB: 1960/07/04, 55 y.o.   MRN: [id number]  PROGRESS NOTE    ANGELETTA STAUCH  [id number] DOB: Jan 14, 1961 DOA: 11/09/2015  PCP: [place]   Brief Narrative:  55 yo female with history of mental retardation, epilepsy who presented to [facility] with recurrent falls, recently had fall that resulted in broken ankle and she was subsequently sent home with a boot. She continued to have falls with noticeable decline in her motor function and mentation. She was referred to hospital this time by her neurologist due to possible recurrent seizures.   Assessment & Plan:   Active Problems: Frequent, recurrent falls / Acute metabolic encephalopathy  - Possibly from recurrent seizures - CT head showed encephalomalacia in the inferomedial right frontal lobe. - EEG pending  - Ammonia is WNL - PT evaluation in progress  Seizures - No reports of seizures in hospital so far - She is on Depakote and vimpat - EEG is pending - Neurology following    DVT prophylaxis: Lovenox subQ Code Status: full code  Family Communication: no family at the bedside this am Disposition Plan: home likely in next 1-2 days by 11/12/2015   Consultants:   Neurology   Procedures:   None   Antimicrobials:   None    Subjective: No overnight events.   Objective: Filed Vitals:   11/09/15 2215 11/09/15 2245 11/09/15 2359 11/10/15 0520  BP: 130/56 128/67 111/57 115/63  Pulse: 68 72 64 68  Temp:   98.3 F (36.8 C) 97.6 F (36.4 C)  TempSrc:   Oral Oral  Resp: 22 19 20 20   Height:   5\' 6"  (1.676 m)   Weight:   84.369 kg (186 lb)   SpO2: 100% 100% 100% 100%    Intake/Output Summary (Last 24 hours) at 11/10/15 0646 Last data filed at 11/09/15 2252  Gross per 24 hour  Intake    500 ml  Output    400 ml  Net    100 ml   Filed Weights   11/09/15 2359  Weight: 84.369 kg (186 lb)    Examination:  General exam: Appears calm and comfortable    Respiratory system: Clear to auscultation. Respiratory effort normal. Cardiovascular system: S1 & S2 heard, RRR. No JVD Gastrointestinal system: Abdomen is nondistended, soft and nontender. No organomegaly or masses felt. Normal bowel sounds heard. Central nervous system: Slight dysarthria. No focal neurological deficits. Extremities: Symmetric 5 x 5 power. Skin: No rashes, lesions or ulcers Psychiatry: Normal mood  Data Reviewed: I have personally reviewed following labs and imaging studies  CBC:  Recent Labs Lab 11/08/15 1509 11/09/15 1727  WBC 7.0 5.8  HGB 12.1 11.5*  HCT 36.4 36.1  MCV 96.3 97.0  PLT 215 [id number]   Basic Metabolic Panel:  Recent Labs Lab 11/08/15 1509 11/09/15 1727  NA 138 138  K 3.6 3.9  CL 104 105  CO2 25 27  GLUCOSE 102* 96  BUN 19 17  CREATININE 0.87 0.84  CALCIUM 9.3 9.8   GFR: Estimated Creatinine Clearance: 84.7 mL/min (by C-G formula based on Cr of 0.84). Liver Function Tests: No results for input(s): AST, ALT, ALKPHOS, BILITOT, PROT, ALBUMIN in the last 168 hours. No results for input(s): LIPASE, AMYLASE in the last 168 hours.  Recent Labs Lab 11/09/15 2043  AMMONIA 29   Coagulation Profile: No results for input(s): INR, PROTIME in the last 168 hours. Cardiac Enzymes: No results for input(s): CKTOTAL, CKMB, CKMBINDEX,  TROPONINI in the last 168 hours. BNP (last 3 results) No results for input(s): PROBNP in the last 8760 hours. HbA1C: No results for input(s): HGBA1C in the last 72 hours. CBG: No results for input(s): GLUCAP in the last 168 hours. Lipid Profile: No results for input(s): CHOL, HDL, LDLCALC, TRIG, CHOLHDL, LDLDIRECT in the last 72 hours. Thyroid Function Tests: No results for input(s): TSH, T4TOTAL, FREET4, T3FREE, THYROIDAB in the last 72 hours. Anemia Panel: No results for input(s): VITAMINB12, FOLATE, FERRITIN, TIBC, IRON, RETICCTPCT in the last 72 hours. Urine analysis:    Component Value Date/Time    COLORURINE YELLOW 11/08/2015 1510   APPEARANCEUR CLEAR 11/08/2015 1510   LABSPEC 1.025 11/08/2015 1510   PHURINE 6.0 11/08/2015 1510   GLUCOSEU NEGATIVE 11/08/2015 1510   HGBUR NEGATIVE 11/08/2015 1510   BILIRUBINUR NEGATIVE 11/08/2015 1510   KETONESUR NEGATIVE 11/08/2015 1510   PROTEINUR NEGATIVE 11/08/2015 1510   UROBILINOGEN 0.2 10/26/2014 2340   NITRITE NEGATIVE 11/08/2015 1510   LEUKOCYTESUR NEGATIVE 11/08/2015 1510   Sepsis Labs: @LABRCNTIP (procalcitonin:4,lacticidven:4)   )No results found for this or any previous visit (from the past 240 hour(s)).    Radiology Studies: Dg Pelvis 1-2 Views 11/08/2015  No acute fracture or dislocation identified about the pelvis.   Dg Ankle Complete Right 11/08/2015  Mildly displaced fractures of both the right lateral and medial malleoli.   Ct Head Wo Contrast 11/08/2015  CT head: Atrophy, greatest in the cerebellar region. Encephalomalacia in the inferomedial right frontal lobe. No intracranial mass, hemorrhage, extra-axial fluid collection, or acute infarct. There is a right frontal scalp hematoma. Mild chronic mastoid disease bilaterally is stable. There is probable cerumen in the right external auditory canal. CT cervical spine: Acute no acute fracture or spondylolisthesis. Multilevel arthropathy. Impression on several exiting nerve roots is noted due to bony hypertrophy.   Ct Cervical Spine Wo Contrast 11/08/2015  CT head: Atrophy, greatest in the cerebellar region. Encephalomalacia in the inferomedial right frontal lobe. No intracranial mass, hemorrhage, extra-axial fluid collection, or acute infarct. There is a right frontal scalp hematoma. Mild chronic mastoid disease bilaterally is stable. There is probable cerumen in the right external auditory canal. CT cervical spine: Acute no acute fracture or spondylolisthesis. Multilevel arthropathy. Impression on several exiting nerve roots is noted due to bony hypertrophy.   Dg Knee Complete 4 Views  Right 11/08/2015  Negative exam.     Scheduled Meds: . divalproex  500 mg Oral BID  . enoxaparin (LOVENOX) injection  40 mg Subcutaneous Daily  . lacosamide  200 mg Oral BID   Continuous Infusions: . dextrose 5 % and 0.45% NaCl 100 mL/hr at 11/10/15 0005       Time spent: 25 minutes  Greater than 50% of the time spent on counseling and coordinating the care.   Leisa Lenz, MD [facility] Pager [phone]  If 7PM-7AM, please contact night-coverage [email or web address] Password [facility] 11/10/2015, 6:46 AM

## 2015-11-10 NOTE — Progress Notes (Signed)
Initial Nutrition Assessment  DOCUMENTATION CODES:   Not applicable  INTERVENTION:   Ensure Enlive po BID, each supplement provides 350 kcal and 20 grams of protein  NUTRITION DIAGNOSIS:   Inadequate oral intake related to  (acute encephalopathy) as evidenced by meal completion < 25%  GOAL:   Patient will meet greater than or equal to 90% of their needs  MONITOR:   PO intake, Supplement acceptance, Labs, Weight trends, I & O's  REASON FOR ASSESSMENT:   Malnutrition Screening Tool  ASSESSMENT:   55 yo Female with history of mental retardation, epilepsy who presented to [facility] with recurrent falls, recently had fall that resulted in broken ankle and she was subsequently sent home with a boot. She continued to have falls with noticeable decline in her motor function and mentation. She was referred to hospital this time by her neurologist due to possible recurrent seizures.   RD unable to speak with patient >> encephalopathic, rolling eyes back. Spoke briefly with pt's Mother. Reports pt is not eating very much >> asking if she can go down to the [employer] and get a hamburger for her. PO intake 0-10% per flowsheet records. Would benefit from oral nutrition supplements >> will order.  Unable to complete Nutrition-Focused physical exam at this time.   Diet Order:  Diet regular Room service appropriate?: Yes; Fluid consistency:: Thin  Skin:  Reviewed, no issues  Last BM:  N/A  Height:   Ht Readings from Last 1 Encounters:  11/09/15 5\' 6"  (1.676 m)    Weight:   Wt Readings from Last 1 Encounters:  11/09/15 186 lb (84.369 kg)    Ideal Body Weight:  59 kg  BMI:  Body mass index is 30.04 kg/(m^2).  Estimated Nutritional Needs:   Kcal:  1800-2000  Protein:  90-100 gm  Fluid:  1.8-2.0 L  EDUCATION NEEDS:   No education needs identified at this time  Arthur Holms, RD, LDN Pager #: [phone] After-Hours Pager #: [phone]

## 2015-11-10 NOTE — Progress Notes (Signed)
Subjective: Patient awake and alert no further seizures over night.  Transport is taking her to get EEG and MRI  Exam: Filed Vitals:   11/09/15 2359 11/10/15 0520  BP: 111/57 115/63  Pulse: 64 68  Temp: 98.3 F (36.8 C) 97.6 F (36.4 C)  Resp: 20 20    HEENT-  Normocephalic, no lesions, without obvious abnormality.  Normal external eye and conjunctiva.  Normal TM's bilaterally.  Normal auditory canals and external ears. Normal external nose, mucus membranes and septum.  Normal pharynx. Cardiovascular- S1, S2 normal, pulses palpable throughout   Lungs- chest clear, no wheezing, rales, normal symmetric air entry Abdomen- normal findings: bowel sounds normal Extremities- no edema     Gen: In bed, NAD MS: alert and oriented and able to follow commands. Speech has a slyght dysarthria but no aphasia.  CN: PERRLA, EOMI, TML, face symmetric, sensationintact Motor: MAEW Sensory: Intact DTR:1+  Pertinent Labs/Diagnostics: EEG, MRI, Vimpat level pending.  Depakote level yesterday [place] PA-C [facility] [phone]  Impression: 55 YO female with increased frequency of seizures and falls. VPA level 101 on admission. No further seizures over night. MRI brain and EEG pending. Her last albumin was good, but with this level of depakote, will need to ensure this is the case. I suspect her tremor may be related to depakote.   The description of bilateral arm shaking just after standing and falling is concerning, however, for myoclonic seizures. Given this is not her typical seizure type, however, I do think that myoclonic syncope should be checked for as well.   Of note, mother states that vimpat helped considerably.   Recommendations: 1) check albumin 2) start keppra 1gm x 1, then 500mg  BID 3) continue vimpat 200mg  BID 4) Orthostatic vital signs.  5) continue depakote 500mg  BID 6) Will follow.    Roland Rack, MD [facility] [phone]  If 7pm- 7am, please page neurology on call as listed in [place].

## 2015-11-10 NOTE — Progress Notes (Signed)
EEG Completed; Results Pending  

## 2015-11-10 NOTE — Progress Notes (Signed)
RN reported that patient came from EEG and had an episode of what seems to be seizure, patient suddenly not responding and dropped the phone. Now pt seems confused, likely post-ictal. Order placed for 1 mg ativan which we will order as needed every 6 hours IV.  Leisa Lenz [facility] [phone]

## 2015-11-10 NOTE — Procedures (Signed)
History: 55 yo F with MR and seizures  Sedation: none  Technique: This is a 21 channel routine scalp EEG performed at the bedside with bipolar and monopolar montages arranged in accordance to the international 10/20 system of electrode placement. One channel was dedicated to EKG recording.    Background:  There is a posterior dominant rhythm of 8 Hz. There is also admixed delta and theta activity diffusely throughout the study. There are moderately frequent generalized spike and wave or polyspike and wave discharges.   Photic stimulation: Physiologic driving is not performed  EEG Abnormalities: 1) Moderately frequent Generalized spike-and-wave discharges 2) Generalized slow activity.   Clinical Interpretation: This EEG is consistent with the interictal expression of a generalized epilepsy. No seizure was recorded on this study.   Roland Rack, MD [facility] [phone]  If 7pm- 7am, please page neurology on call as listed in [place].

## 2015-11-10 NOTE — Evaluation (Signed)
Physical Therapy Evaluation Patient Details Name: Maria [name] MRN: [id number] DOB: [birth year] Today's Date: 11/10/2015   History of Present Illness  55 y.o. female admitted to [facility] on 11/09/15 for recurrent falls.  Suspected new seizure activity in the setting of epilepsy.  Pt with significant PMhx of recent fall with L ankle fx (now in boot for gait), MR, dysphagia, tremor, and thrombocytopenia.  Clinical Impression  Pt transferred chair to [facility] and back to chair.  She was not agreeable to walk or get back into bed.  She will not do anything she does not want to do and she is a very high fall risk (her transfer even with RW was very unsafe).  I think that her mother is struggling to care for her at home.  PT recommending SNF for rehab.   PT to follow acutely for deficits listed below.       Follow Up Recommendations SNF    Equipment Recommendations  None recommended by PT    Recommendations for Other Services   NA    Precautions / Restrictions Precautions Precautions: Fall Precaution Comments: pt with recent history of frequent falls      Mobility                 Transfers Overall transfer level: Needs assistance Equipment used: Rolling walker (2 wheeled) Transfers: Sit to/from [facility] Sit to Stand: Min assist Stand pivot transfers: Min assist       General transfer comment: Min assist to support trunk and keep pt from sitting prematurely during transfer to [facility].  Pt will not move unless she wants to move.    Ambulation/Gait             General Gait Details: pt refused.       Balance Overall balance assessment: Needs assistance Sitting-balance support: Feet supported;No upper extremity supported Sitting balance-Leahy Scale: Good     Standing balance support: Bilateral upper extremity supported Standing balance-Leahy Scale: Poor                               Pertinent Vitals/Pain Pain Assessment: Faces Pain Score: 0-No  pain    Home Living Family/patient expects to be discharged to:: Private residence Living Arrangements: Parent (mother) Available Help at Discharge: Family;Available 24 hours/day Type of Home: House Home Access: Stairs to enter Entrance Stairs-Rails: None Entrance Stairs-Number of Steps: 1 ("1/2 a step") Home Layout: Two level;Laundry or work area in basement;Able to live on main level with bedroom/bathroom        Prior Function Level of Independence: Needs assistance   Gait / Transfers Assistance Needed: uses RW, mom helps, if goin all the way across the house uses WC (all since she broke her ankle).                Extremity/Trunk Assessment   Upper Extremity Assessment: Overall WFL for tasks assessed           Lower Extremity Assessment: RLE deficits/detail RLE Deficits / Details: right leg in CAM boot pt would not participate in strength testing          Cognition Arousal/Alertness: Lethargic Behavior During Therapy: Agitated (mom reports this is normal) Overall Cognitive Status: History of cognitive impairments - at baseline  Assessment/Plan    PT Assessment Patient needs continued PT services  PT Diagnosis Difficulty walking;Abnormality of gait;Generalized weakness;Acute pain   PT Problem List Decreased strength;Decreased activity tolerance;Decreased balance;Decreased mobility;Decreased knowledge of use of DME;Decreased safety awareness  PT Treatment Interventions DME instruction;Gait training;Functional mobility training;Therapeutic activities;Therapeutic exercise;Balance training;Patient/family education   PT Goals (Current goals can be found in the Care Plan section) Acute Rehab PT Goals Patient Stated Goal: mother thinks it might be good to try SNF rehab PT Goal Formulation: With family Time For Goal Achievement: 11/24/15 Potential to Achieve Goals: Fair    Frequency Min 3X/week   Barriers to discharge  Other (comment) Pt is likely getting to bee too much for her elderly mother to handle at home.        End of Session Equipment Utilized During Treatment: Other (comment) (R CAM boot) Activity Tolerance: Patient limited by fatigue Patient left: in chair;with call bell/phone within reach;with family/visitor present      Functional Assessment Tool Used: assist level Functional Limitation: Mobility: Walking and moving around Mobility: Walking and Moving Around Current Status [id number]): At least 20 percent but less than 40 percent impaired, limited or restricted Mobility: Walking and Moving Around Goal Status [phone]): At least 1 percent but less than 20 percent impaired, limited or restricted    Time: 1552-1609 PT Time Calculation (min) (ACUTE ONLY): 17 min   Charges:   PT Evaluation $PT Eval Moderate Complexity: 1 Procedure     PT G Codes:   PT G-Codes **NOT FOR INPATIENT CLASS** Functional Assessment Tool Used: assist level Functional Limitation: Mobility: Walking and moving around Mobility: Walking and Moving Around Current Status [id number]): At least 20 percent but less than 40 percent impaired, limited or restricted Mobility: Walking and Moving Around Goal Status [phone]): At least 1 percent but less than 20 percent impaired, limited or restricted    Deepa Barthel B. [place], [place], DPT [phone]   11/10/2015, 4:21 PM

## 2015-11-10 NOTE — Progress Notes (Signed)
At 1030 patient was sitting in bed talking on phone with nurse at bedside, when suddenly patient dropped phone and experienced left arm straightening and would not respond to verbal stimuli. MD called, 1 mg ativan administered. Event lasted about 3 minutes and after neuro exam was normal with exception of confusion.

## 2015-11-10 NOTE — Care Management Note (Addendum)
Case Management Note  Patient Details  Name: HOLLYE BLACKSHER MRN: [id number] Date of Birth: [birth year]  Subjective/Objective:       Presents with  history of mental retardation, epilepsy who was brought by family with c/o of recurrent falls in the past few weeks. Lives with mom. Independent with ADL's, wears boot to R le 2/2 to recent  fx. PCP: [place].  Action/Plan: Return to home when medically stable.CM to f/u with disposition needs.  Expected Discharge Date:                  Expected Discharge Plan:  Home/Self Care  In-House Referral:     Discharge planning Services  CM Consult  Post Acute Care Choice:    Choice offered to:     DME Arranged:    DME Agency:     HH Arranged:    HH Agency:     Status of Service:  In process, will continue to follow  Medicare Important Message Given:    Date Medicare IM Given:    Medicare IM give by:    Date Additional Medicare IM Given:    Additional Medicare Important Message give by:     If discussed at [place] of Stay Meetings, dates discussed:    Additional Comments: Tanairy Holdsworth (Mother) [phone], Faustino Congress (Sister) [phone]  Whitman Hero [place], [place] [phone] 11/10/2015, 11:41 AM

## 2015-11-11 ENCOUNTER — Inpatient Hospital Stay (HOSPITAL_COMMUNITY): Payer: Medicare Other

## 2015-11-11 DIAGNOSIS — G9340 Encephalopathy, unspecified: Secondary | ICD-10-CM | POA: Insufficient documentation

## 2015-11-11 MED ORDER — OXYBUTYNIN CHLORIDE 5 MG PO TABS
5.0000 mg | ORAL_TABLET | Freq: Every day | ORAL | Status: DC
  Administered 2015-11-11 – 2015-11-13 (×3): 5 mg via ORAL
  Filled 2015-11-11 (×3): qty 1

## 2015-11-11 MED ORDER — BISACODYL 5 MG PO TBEC
10.0000 mg | DELAYED_RELEASE_TABLET | Freq: Once | ORAL | Status: AC
  Administered 2015-11-11: 10 mg via ORAL
  Filled 2015-11-11: qty 2

## 2015-11-11 MED ORDER — DOCUSATE SODIUM 100 MG PO CAPS
100.0000 mg | ORAL_CAPSULE | Freq: Two times a day (BID) | ORAL | Status: DC
  Administered 2015-11-11 – 2015-11-13 (×4): 100 mg via ORAL
  Filled 2015-11-11 (×4): qty 1

## 2015-11-11 NOTE — Progress Notes (Signed)
Patient's family requested something to help with a bowel movement. On-call NP Schorr notified.  Will continue to monitor and await any new orders.

## 2015-11-11 NOTE — Progress Notes (Addendum)
Patient ID: Maria [name], female   DOB: August 28, 1960, 55 y.o.   MRN: [id number]  PROGRESS NOTE    Maria [name]  [id number] DOB: [birth year] DOA: 11/09/2015  PCP: [place]   Brief Narrative:  55 yo female with history of mental retardation, epilepsy who presented to [facility] with recurrent falls, recently had fall that resulted in broken ankle and she was subsequently sent home with a boot. She continued to have falls with noticeable decline in her motor function and mentation. She was referred to hospital this time by her neurologist due to possible recurrent seizures.   Assessment & Plan:   Active Problems: Frequent, recurrent falls / Acute metabolic encephalopathy  - Possibly from recurrent seizures - Ammonia is WNL - CT head showed encephalomalacia in the inferomedial right frontal lobe. - PT evaluation pending   Seizures - No reports of seizures in hospital so far - She is on Depakote and vimpat - EEG showed interictal expression of a generalized epilepsy - Neurology following    DVT prophylaxis: Lovenox subQ Code Status: full code  Family Communication: mother at the bedside this am Disposition Plan: home likely in next 1-2 days by 11/12/2015   Consultants:   Neurology   Procedures:   EEG 11/10/2015 - EEG is consistent with the interictal expression of a generalized epilepsy. No seizure was recorded on this study  Antimicrobials:   None    Subjective: No overnight events.   Objective: Filed Vitals:   11/10/15 1327 11/10/15 1525 11/10/15 2146 11/11/15 0607  BP: 123/53 104/61 93/65 122/54  Pulse: 68 73 76 64  Temp: 97.4 F (36.3 C) 98.1 F (36.7 C) 97.7 F (36.5 C) 98 F (36.7 C)  TempSrc:   Oral Oral  Resp: 20  16 16   Height:      Weight:      SpO2: 100% 100% 100% 96%    Intake/Output Summary (Last 24 hours) at 11/11/15 0928 Last data filed at 11/11/15 [id number]  Gross per 24 hour  Intake   1550 ml  Output   1400 ml  Net    150  ml   Filed Weights   11/09/15 2359  Weight: 84.369 kg (186 lb)    Examination:  General exam: No acute distress  Respiratory system: No wheezing, no rhonchi  Cardiovascular system: S1 & S2 appreciated, rate controlled  Gastrointestinal system: (+) BS, non tender  Central nervous system: Nonfocal  Extremities: palpable pulses, no edema Skin: warm, dry Psychiatry: No agitation, no restlessness    Data Reviewed: I have personally reviewed following labs and imaging studies  CBC:  Recent Labs Lab 11/08/15 1509 11/09/15 1727  WBC 7.0 5.8  HGB 12.1 11.5*  HCT 36.4 36.1  MCV 96.3 97.0  PLT 215 [id number]   Basic Metabolic Panel:  Recent Labs Lab 11/08/15 1509 11/09/15 1727  NA 138 138  K 3.6 3.9  CL 104 105  CO2 25 27  GLUCOSE 102* 96  BUN 19 17  CREATININE 0.87 0.84  CALCIUM 9.3 9.8   GFR: Estimated Creatinine Clearance: 84.7 mL/min (by C-G formula based on Cr of 0.84). Liver Function Tests:  Recent Labs Lab 11/10/15 1417  ALBUMIN 3.5   No results for input(s): LIPASE, AMYLASE in the last 168 hours.  Recent Labs Lab 11/09/15 2043  AMMONIA 29   Coagulation Profile: No results for input(s): INR, PROTIME in the last 168 hours. Cardiac Enzymes: No results for input(s): CKTOTAL, CKMB, CKMBINDEX, TROPONINI in the last  168 hours. BNP (last 3 results) No results for input(s): PROBNP in the last 8760 hours. HbA1C: No results for input(s): HGBA1C in the last 72 hours. CBG: No results for input(s): GLUCAP in the last 168 hours. Lipid Profile: No results for input(s): CHOL, HDL, LDLCALC, TRIG, CHOLHDL, LDLDIRECT in the last 72 hours. Thyroid Function Tests: No results for input(s): TSH, T4TOTAL, FREET4, T3FREE, THYROIDAB in the last 72 hours. Anemia Panel: No results for input(s): VITAMINB12, FOLATE, FERRITIN, TIBC, IRON, RETICCTPCT in the last 72 hours. Urine analysis:    Component Value Date/Time   COLORURINE YELLOW 11/08/2015 1510   APPEARANCEUR CLEAR  11/08/2015 1510   LABSPEC 1.025 11/08/2015 1510   PHURINE 6.0 11/08/2015 1510   GLUCOSEU NEGATIVE 11/08/2015 1510   HGBUR NEGATIVE 11/08/2015 1510   BILIRUBINUR NEGATIVE 11/08/2015 1510   KETONESUR NEGATIVE 11/08/2015 1510   PROTEINUR NEGATIVE 11/08/2015 1510   UROBILINOGEN 0.2 10/26/2014 2340   NITRITE NEGATIVE 11/08/2015 1510   LEUKOCYTESUR NEGATIVE 11/08/2015 1510   Sepsis Labs: @LABRCNTIP (procalcitonin:4,lacticidven:4)   )No results found for this or any previous visit (from the past 240 hour(s)).    Radiology Studies: Dg Pelvis 1-2 Views 11/08/2015  No acute fracture or dislocation identified about the pelvis.   Dg Ankle Complete Right 11/08/2015  Mildly displaced fractures of both the right lateral and medial malleoli.   Ct Head Wo Contrast 11/08/2015  CT head: Atrophy, greatest in the cerebellar region. Encephalomalacia in the inferomedial right frontal lobe. No intracranial mass, hemorrhage, extra-axial fluid collection, or acute infarct. There is a right frontal scalp hematoma. Mild chronic mastoid disease bilaterally is stable. There is probable cerumen in the right external auditory canal. CT cervical spine: Acute no acute fracture or spondylolisthesis. Multilevel arthropathy. Impression on several exiting nerve roots is noted due to bony hypertrophy.   Ct Cervical Spine Wo Contrast 11/08/2015  CT head: Atrophy, greatest in the cerebellar region. Encephalomalacia in the inferomedial right frontal lobe. No intracranial mass, hemorrhage, extra-axial fluid collection, or acute infarct. There is a right frontal scalp hematoma. Mild chronic mastoid disease bilaterally is stable. There is probable cerumen in the right external auditory canal. CT cervical spine: Acute no acute fracture or spondylolisthesis. Multilevel arthropathy. Impression on several exiting nerve roots is noted due to bony hypertrophy.   Dg Knee Complete 4 Views Right 11/08/2015  Negative exam.     Scheduled  Meds: . antiseptic oral rinse  7 mL Mouth Rinse BID  . divalproex  500 mg Oral BID  . enoxaparin (LOVENOX) injection  40 mg Subcutaneous Daily  . feeding supplement (ENSURE ENLIVE)  237 mL Oral BID BM  . lacosamide  200 mg Oral BID  . levETIRAcetam  500 mg Oral BID   Continuous Infusions: . dextrose 5 % and 0.45% NaCl 100 mL/hr at 11/10/15 2206     LOS: 1 day   Time spent: 15 minutes  Greater than 50% of the time spent on counseling and coordinating the care.   Leisa Lenz, MD [facility] Pager [phone]  If 7PM-7AM, please contact night-coverage [email or web address] Password [email or web address] 11/11/2015, 9:28 AM

## 2015-11-11 NOTE — Progress Notes (Addendum)
Subjective: No further seizures  Exam: Filed Vitals:   11/11/15 0607 11/11/15 1509  BP: 122/54 118/59  Pulse: 64 72  Temp: 98 F (36.7 C) 97.6 F (36.4 C)  Resp: 16 18    Gen: In bed, NAD MS: alert and oriented and able to follow commands. Speech has a slyght dysarthria but no aphasia.  CN: PERRLA, EOMI, TML, face symmetric, sensationintact Motor: MAEW Sensory: Intact DTR:1+  Impression: 55 YO female with increased frequency of seizures and falls. VPA level 101 on admission. No further seizures over night. She did not tolerate MRI, and I don't think that putting her through an MRI with anesthesia as indicated. From the description of the events that mother gives, I do suspect these represent myoclonic seizures.  I do wonder if her Depakote levels are actually high and I will send a free Depakote level.  Recommendations: 1) Continue Vimpat, Depakote, Keppra 2) free Depakote level 3) PT  Roland Rack, MD [facility] [phone]  If 7pm- 7am, please page neurology on call as listed in [place].

## 2015-11-12 DIAGNOSIS — W19XXXS Unspecified fall, sequela: Secondary | ICD-10-CM

## 2015-11-12 MED ORDER — PANTOPRAZOLE SODIUM 40 MG PO TBEC
40.0000 mg | DELAYED_RELEASE_TABLET | Freq: Every day | ORAL | Status: DC
  Administered 2015-11-12 – 2015-11-13 (×2): 40 mg via ORAL
  Filled 2015-11-12 (×2): qty 1

## 2015-11-12 MED ORDER — ESTRADIOL 0.075 MG/24HR TD PTWK
0.0750 mg | MEDICATED_PATCH | TRANSDERMAL | Status: DC
  Administered 2015-11-12: 0.075 mg via TRANSDERMAL
  Filled 2015-11-12: qty 1

## 2015-11-12 MED ORDER — ESTRADIOL 0.075 MG/24HR TD PTWK: 0.0750 mg | MEDICATED_PATCH | TRANSDERMAL | Status: DC

## 2015-11-12 NOTE — Progress Notes (Signed)
Subjective: No further seizures  Exam: Filed Vitals:   11/12/15 0509 11/12/15 1315  BP: 139/68 135/75  Pulse: 79 81  Temp:  97.8 F (36.6 C)  Resp: 16 19    Gen: In bed, NAD MS: alert and oriented and able to follow commands. Speech has a slyght dysarthria but no aphasia.  CN: PERRLA, EOMI, TML, face symmetric, sensationintact Motor: MAEW Sensory: Intact DTR:1+  Impression: 55 YO female with increased frequency of seizures and falls. VPA level 101 on admission. No further seizures over night. She did not tolerate MRI, and I don't think that putting her through an MRI with anesthesia as indicated. From the description of the events that mother gives, I do suspect these represent myoclonic seizures.  I do wonder if her Depakote levels are actually high and I will send a free Depakote level. This will take about a week come back.  With no further events, I think at this time I would favor continuing current therapies and seeing how she does over the next few weeks.  Recommendations: 1) Continue Vimpat, Depakote, Keppra 2) free Depakote level 3) PT 4) no further inpatient recommendations at this time. Please call with any further questions or concerns. Follow-up with Dr. Jannifer Franklin as an outpatient.  Roland Rack, MD [facility] [phone]  If 7pm- 7am, please page neurology on call as listed in [place].

## 2015-11-12 NOTE — Progress Notes (Addendum)
Patient ID: Maria [name], female   DOB: 23-Feb-1961, 55 y.o.   MRN: [id number]  PROGRESS NOTE    Maria [name]  [id number] DOB: Dec 22, 1960 DOA: 11/09/2015  PCP: [place]   Brief Narrative:  55 yo female with history of mental retardation, epilepsy who presented to [facility] with recurrent falls, recently had fall that resulted in broken ankle and she was subsequently sent home with a boot. She continued to have falls with noticeable decline in her motor function and mentation. She was referred to hospital this time by her neurologist due to possible recurrent seizures.   Assessment & Plan:   Active Problems: Frequent, recurrent falls / Acute metabolic encephalopathy  - Possibly from recurrent seizures - Ammonia WNL - CT head showed encephalomalacia in the inferomedial right frontal lobe. - EEG showed interictal expression of a generalized epilepsy - PT evaluation - SNF placement   Seizures - No reports of seizures in hospital so far - Continue keppra, vimpat and Depakote level - Checking Depakote level - EEG consistent with the interictal expression of a generalized epilepsy. No seizure was recorded on this study - Neurology following, appreciate their input    DVT prophylaxis: Lovenox subQ Code Status: full code  Family Communication: sister at the bedside this am Disposition Plan: SNF Monday    Consultants:   Neurology   Procedures:   EEG 11/10/2015 - EEG is consistent with the interictal expression of a generalized epilepsy. No seizure was recorded on this study  Antimicrobials:   None    Subjective: No overnight events. No reports of seizures.  Objective: Filed Vitals:   11/11/15 0607 11/11/15 1509 11/11/15 2121 11/12/15 0509  BP: 122/54 118/59 116/75 139/68  Pulse: 64 72 97 79  Temp: 98 F (36.7 C) 97.6 F (36.4 C) 97.6 F (36.4 C)   TempSrc: Oral Oral Oral   Resp: 16 18 16 16   Height:      Weight:      SpO2: 96% 98% 100% 97%     Intake/Output Summary (Last 24 hours) at 11/12/15 1256 Last data filed at 11/12/15 0956  Gross per 24 hour  Intake   1650 ml  Output   2625 ml  Net   -975 ml   Filed Weights   11/09/15 2359  Weight: 84.369 kg (186 lb)    Examination:  General exam: No distress  Respiratory system: No wheezing, bilateral air entry  Cardiovascular system: S1 & S2 (+), RRR  Gastrointestinal system: (+) BS, no abd distention  Central nervous system: No focal deficits  Extremities: non tender, no cyanosis, no edema  Skin: no lesions, no ulcers  Psychiatry: No agitation, normal mood    Data Reviewed: I have personally reviewed following labs and imaging studies  CBC:  Recent Labs Lab 11/08/15 1509 11/09/15 1727  WBC 7.0 5.8  HGB 12.1 11.5*  HCT 36.4 36.1  MCV 96.3 97.0  PLT 215 123456   Basic Metabolic Panel:  Recent Labs Lab 11/08/15 1509 11/09/15 1727  NA 138 138  K 3.6 3.9  CL 104 105  CO2 25 27  GLUCOSE 102* 96  BUN 19 17  CREATININE 0.87 0.84  CALCIUM 9.3 9.8   GFR: Estimated Creatinine Clearance: 84.7 mL/min (by C-G formula based on Cr of 0.84). Liver Function Tests:  Recent Labs Lab 11/10/15 1417  ALBUMIN 3.5   No results for input(s): LIPASE, AMYLASE in the last 168 hours.  Recent Labs Lab 11/09/15 2043  AMMONIA 29  Coagulation Profile: No results for input(s): INR, PROTIME in the last 168 hours. Cardiac Enzymes: No results for input(s): CKTOTAL, CKMB, CKMBINDEX, TROPONINI in the last 168 hours. BNP (last 3 results) No results for input(s): PROBNP in the last 8760 hours. HbA1C: No results for input(s): HGBA1C in the last 72 hours. CBG: No results for input(s): GLUCAP in the last 168 hours. Lipid Profile: No results for input(s): CHOL, HDL, LDLCALC, TRIG, CHOLHDL, LDLDIRECT in the last 72 hours. Thyroid Function Tests: No results for input(s): TSH, T4TOTAL, FREET4, T3FREE, THYROIDAB in the last 72 hours. Anemia Panel: No results for input(s):  VITAMINB12, FOLATE, FERRITIN, TIBC, IRON, RETICCTPCT in the last 72 hours. Urine analysis:    Component Value Date/Time   COLORURINE YELLOW 11/08/2015 1510   APPEARANCEUR CLEAR 11/08/2015 1510   LABSPEC 1.025 11/08/2015 1510   PHURINE 6.0 11/08/2015 1510   GLUCOSEU NEGATIVE 11/08/2015 1510   HGBUR NEGATIVE 11/08/2015 1510   BILIRUBINUR NEGATIVE 11/08/2015 1510   KETONESUR NEGATIVE 11/08/2015 1510   PROTEINUR NEGATIVE 11/08/2015 1510   UROBILINOGEN 0.2 10/26/2014 2340   NITRITE NEGATIVE 11/08/2015 1510   LEUKOCYTESUR NEGATIVE 11/08/2015 1510   Sepsis Labs: @LABRCNTIP (procalcitonin:4,lacticidven:4)   )No results found for this or any previous visit (from the past 240 hour(s)).    Radiology Studies: Dg Pelvis 1-2 Views 11/08/2015  No acute fracture or dislocation identified about the pelvis.   Dg Ankle Complete Right 11/08/2015  Mildly displaced fractures of both the right lateral and medial malleoli.   Ct Head Wo Contrast 11/08/2015  CT head: Atrophy, greatest in the cerebellar region. Encephalomalacia in the inferomedial right frontal lobe. No intracranial mass, hemorrhage, extra-axial fluid collection, or acute infarct. There is a right frontal scalp hematoma. Mild chronic mastoid disease bilaterally is stable. There is probable cerumen in the right external auditory canal. CT cervical spine: Acute no acute fracture or spondylolisthesis. Multilevel arthropathy. Impression on several exiting nerve roots is noted due to bony hypertrophy.   Ct Cervical Spine Wo Contrast 11/08/2015  CT head: Atrophy, greatest in the cerebellar region. Encephalomalacia in the inferomedial right frontal lobe. No intracranial mass, hemorrhage, extra-axial fluid collection, or acute infarct. There is a right frontal scalp hematoma. Mild chronic mastoid disease bilaterally is stable. There is probable cerumen in the right external auditory canal. CT cervical spine: Acute no acute fracture or spondylolisthesis.  Multilevel arthropathy. Impression on several exiting nerve roots is noted due to bony hypertrophy.   Dg Knee Complete 4 Views Right 11/08/2015  Negative exam.     Scheduled Meds: . antiseptic oral rinse  7 mL Mouth Rinse BID  . divalproex  500 mg Oral BID  . docusate sodium  100 mg Oral BID  . enoxaparin (LOVENOX) injection  40 mg Subcutaneous Daily  . estradiol  0.075 mg Transdermal Once per day on Wed Sat  . feeding supplement (ENSURE ENLIVE)  237 mL Oral BID BM  . lacosamide  200 mg Oral BID  . levETIRAcetam  500 mg Oral BID  . oxybutynin  5 mg Oral Daily   Continuous Infusions: . dextrose 5 % and 0.45% NaCl 100 mL/hr at 11/12/15 0641     LOS: 2 days   Time spent: 15 minutes  Greater than 50% of the time spent on counseling and coordinating the care.   Leisa Lenz, MD [facility] Pager [phone]  If 7PM-7AM, please contact night-coverage [email or web address] Password [email or web address] 11/12/2015, 12:56 PM

## 2015-11-12 NOTE — Clinical Social Work Note (Signed)
Clinical Social Work Assessment  Patient Details  Name: Maria [name] MRN: [id number] Date of Birth: 12-Jun-1960  Date of referral:  11/12/15               Reason for consult:  Facility Placement                Permission sought to share information with:  Facility Sport and exercise psychologist, Family Supports Permission granted to share information::  Yes, Verbal Permission Granted  Name::     Maria [name]  Agency::  [facility] (preference for [employer] area)  Relationship::  Mom  Contact Information:  Cell: [phone], home: [phone]  Housing/Transportation Living arrangements for the past 2 months:  [place] of Information:  Parent Patient Interpreter Needed:  None Criminal Activity/Legal Involvement Pertinent to Current Situation/Hospitalization:  No - Comment as needed Significant Relationships:  Parents Lives with:  Parents Do you feel safe going back to the place where you live?  Yes Need for family participation in patient care:  Yes (Comment) (Patient's mother is caregiver for patient.)  Care giving concerns:  Patient's mother expressed no concerns at this time.   Social Worker assessment / plan:  LCSW received referral for possible SNF placement at time of discharge. LCSW spoke with patient's mother, Maria [name], regarding discharge plan for patient. Per patient's mother, patient's mother cares for patient and is agreeable to PT recommendation for SNF at time of discharge. LCSW to continue to follow and assist with discharge planning needs.  Employment status:  Disabled (Comment on whether or not currently receiving Disability) Insurance information:  Medicare PT Recommendations:  [place] / Referral to community resources:  [place]  Patient/Family's Response to care:  Patient's mother understanding and agreeable to [employer] plan of care.  Patient/Family's Understanding of and Emotional Response to Diagnosis, Current  Treatment, and Prognosis:  Patient's mother understanding and agreeable to LCSW plan of care.  Emotional Assessment Appearance:   (LCSW spoke with patient's mother over the phone.) Attitude/Demeanor/Rapport:   (LCSW spoke with patient's mother over the phone.) Affect (typically observed):   (LCSW spoke with patient's mother over the phone.) Orientation:  Oriented to Place, Oriented to Self Alcohol / Substance use:  Not Applicable Psych involvement (Current and /or in the community):  No (Comment) (Not appropriate on this admission.)  Discharge Needs  Concerns to be addressed:  No discharge needs identified Readmission within the last 30 days:  No Current discharge risk:  None Barriers to Discharge:  No Barriers Identified   Caroline Sauger, LCSW 11/12/2015, 12:36 PM [phone]

## 2015-11-12 NOTE — NC FL2 (Signed)
[place] MEDICAID FL2 LEVEL OF CARE SCREENING TOOL     IDENTIFICATION  Patient Name: Maria [name] Birthdate: 10-10-1960 Sex: female Admission Date (Current Location): 11/09/2015  [facility] and [place] Number:  Herbalist and Address:  [facility] [employer] [facility] [place] [address] [place], [place] [address]      Provider Number: [id number]  Attending Physician Name and Address:  Robbie Lis, MD  Relative Name and Phone Number:       Current Level of Care: Hospital Recommended Level of Care: [place] Prior Approval Number:    Date Approved/Denied:   PASRR Number:    Discharge Plan: SNF    Current Diagnoses: Patient Active Problem List   Diagnosis Date Noted  . Acute encephalopathy   . Myoclonus   . Frequent falls 11/09/2015  . Fall 11/09/2015  . Chest pain 03/16/2014  . Upper abdominal pain 03/16/2014  . Seizure disorder ([place]) 03/16/2014  . 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 Generalized convulsive epilepsy ([place]) 10/06/2012  . Encounter for therapeutic drug monitoring 10/06/2012  . Mental retardation   . Thrombocytopenia ([place]) 05/23/2011  . Seizures ([place]) 03/21/2011  . GERD (gastroesophageal reflux disease) 03/21/2011  . CLOSED FRACTURE OF ACROMIAL END OF CLAVICLE 07/13/2008    Orientation RESPIRATION BLADDER Height & Weight     Self, Place  Normal Continent Weight: 186 lb (84.369 kg) Height:  5\' 6"  (167.6 cm)  BEHAVIORAL SYMPTOMS/MOOD NEUROLOGICAL BOWEL NUTRITION STATUS      Continent Diet (Please see discharge summary.)  AMBULATORY STATUS COMMUNICATION OF NEEDS Skin    (Has not ambulated with PT) Verbally Normal                       Personal Care Assistance Level of Assistance  Bathing, Feeding, Dressing Bathing Assistance: Limited assistance Feeding assistance: Limited assistance Dressing Assistance: Limited assistance     Functional Limitations Info             [place]  PT (By licensed PT), OT (By licensed OT)     PT Frequency: 5 OT Frequency: 5            Contractures      Additional Factors Info  Code Status, Allergies Code Status Info: FULL Allergies Info: Codeine, Lamictal           Current Medications (11/12/2015):  This is the current hospital active medication list Current Facility-Administered Medications  Medication Dose Route Frequency Provider Last Rate Last Dose  . antiseptic oral rinse (CPC / CETYLPYRIDINIUM CHLORIDE 0.05%) solution 7 mL  7 mL Mouth Rinse BID Robbie Lis, MD   7 mL at 11/12/15 1027  . dextrose 5 %-0.45 % sodium chloride infusion   Intravenous Continuous Gennaro Africa, MD 100 mL/hr at 11/12/15 0641    . diazepam (VALIUM) tablet 2 mg  2 mg Oral Q6H PRN Gennaro Africa, MD   2 mg at 11/11/15 2138  . divalproex (DEPAKOTE ER) 24 hr tablet 500 mg  500 mg Oral BID Gennaro Africa, MD   500 mg at 11/12/15 1025  . docusate sodium (COLACE) capsule 100 mg  100 mg Oral BID Rhetta Mura Schorr, NP   100 mg at 11/12/15 1024  . enoxaparin (LOVENOX) injection 40 mg  40 mg Subcutaneous Daily Gennaro Africa, MD   40 mg at 11/12/15 1025  . [START ON 11/13/2015] estradiol (CLIMARA - Dosed in mg/24 hr) patch 0.075 mg  0.075 mg Transdermal Once per day on Mon Thu Alma M Devine, MD      . feeding supplement (ENSURE ENLIVE) (ENSURE ENLIVE) liquid 237 mL  237 mL Oral BID BM Robbie Lis, MD   237 mL at 11/12/15 1023  . lacosamide (VIMPAT) tablet 200 mg  200 mg Oral BID Gennaro Africa, MD   200 mg at 11/12/15 1024  . levETIRAcetam (KEPPRA) tablet 500 mg  500 mg Oral BID Greta Doom, MD   500 mg at 11/12/15 1024  . LORazepam (ATIVAN) injection 1 mg  1 mg Intravenous Q6H PRN Robbie Lis, MD   1 mg at 11/10/15 1052  . oxybutynin (DITROPAN) tablet 5 mg  5 mg Oral Daily Robbie Lis, MD   5 mg at 11/12/15 1025  . oxyCODONE-acetaminophen (PERCOCET/ROXICET) 5-325 MG per tablet 1 tablet  1 tablet Oral Q4H PRN Robbie Lis, MD   1 tablet at  11/12/15 1025     Discharge Medications: Please see discharge summary for a list of discharge medications.  Relevant Imaging Results:  Relevant Lab Results:   Additional Information SSN: [id number]  Caroline Sauger, LCSW

## 2015-11-12 NOTE — Clinical Social Work Placement (Signed)
   CLINICAL SOCIAL WORK PLACEMENT  NOTE  Date:  11/12/2015  Patient Details  Name: TAESHA ROCHELL MRN: [id number] Date of Birth: 12/29/1960  Clinical Social Work is seeking post-discharge placement for this patient at the [place] level of care (*CSW will initial, date and re-position this form in  chart as items are completed):  Yes   Patient/family provided with [place] Work Department's list of facilities offering this level of care within the geographic area requested by the patient (or if unable, by the patient's family).  Yes   Patient/family informed of their freedom to choose among providers that offer the needed level of care, that participate in Medicare, Medicaid or managed care program needed by the patient, have an available bed and are willing to accept the patient.  Yes   Patient/family informed of [name]'s ownership interest in [facility] and [facility], as well as of the fact that they are under no obligation to receive care at these facilities.  PASRR submitted to EDS on 11/12/15     PASRR number received on       Existing PASRR number confirmed on       FL2 transmitted to all facilities in geographic area requested by pt/family on 11/12/15     FL2 transmitted to all facilities within larger geographic area on       Patient informed that his/her managed care company has contracts with or will negotiate with certain facilities, including the following:            Patient/family informed of bed offers received.  Patient chooses bed at       Physician recommends and patient chooses bed at      Patient to be transferred to   on  .  Patient to be transferred to facility by       Patient family notified on   of transfer.  Name of family member notified:        PHYSICIAN Please sign FL2     Additional Comment:    _______________________________________________ Caroline Sauger, LCSW 11/12/2015, 12:40  PM

## 2015-11-13 DIAGNOSIS — F444 Conversion disorder with motor symptom or deficit: Secondary | ICD-10-CM | POA: Insufficient documentation

## 2015-11-13 DIAGNOSIS — F419 Anxiety disorder, unspecified: Secondary | ICD-10-CM | POA: Diagnosis not present

## 2015-11-13 DIAGNOSIS — G40309 Generalized idiopathic epilepsy and epileptic syndromes, not intractable, without status epilepticus: Secondary | ICD-10-CM | POA: Insufficient documentation

## 2015-11-13 DIAGNOSIS — R41841 Cognitive communication deficit: Secondary | ICD-10-CM | POA: Insufficient documentation

## 2015-11-13 DIAGNOSIS — R279 Unspecified lack of coordination: Secondary | ICD-10-CM | POA: Diagnosis not present

## 2015-11-13 DIAGNOSIS — G253 Myoclonus: Secondary | ICD-10-CM

## 2015-11-13 DIAGNOSIS — R1310 Dysphagia, unspecified: Secondary | ICD-10-CM | POA: Diagnosis not present

## 2015-11-13 DIAGNOSIS — K219 Gastro-esophageal reflux disease without esophagitis: Secondary | ICD-10-CM | POA: Diagnosis not present

## 2015-11-13 DIAGNOSIS — N390 Urinary tract infection, site not specified: Secondary | ICD-10-CM | POA: Diagnosis not present

## 2015-11-13 DIAGNOSIS — G9340 Encephalopathy, unspecified: Secondary | ICD-10-CM | POA: Diagnosis not present

## 2015-11-13 DIAGNOSIS — R269 Unspecified abnormalities of gait and mobility: Secondary | ICD-10-CM | POA: Insufficient documentation

## 2015-11-13 DIAGNOSIS — F445 Conversion disorder with seizures or convulsions: Secondary | ICD-10-CM | POA: Diagnosis not present

## 2015-11-13 DIAGNOSIS — M25579 Pain in unspecified ankle and joints of unspecified foot: Secondary | ICD-10-CM | POA: Insufficient documentation

## 2015-11-13 DIAGNOSIS — G9349 Other encephalopathy: Secondary | ICD-10-CM | POA: Diagnosis not present

## 2015-11-13 DIAGNOSIS — M25571 Pain in right ankle and joints of right foot: Secondary | ICD-10-CM | POA: Diagnosis not present

## 2015-11-13 DIAGNOSIS — M6281 Muscle weakness (generalized): Secondary | ICD-10-CM | POA: Diagnosis not present

## 2015-11-13 DIAGNOSIS — G939 Disorder of brain, unspecified: Secondary | ICD-10-CM | POA: Insufficient documentation

## 2015-11-13 DIAGNOSIS — G40901 Epilepsy, unspecified, not intractable, with status epilepticus: Secondary | ICD-10-CM | POA: Insufficient documentation

## 2015-11-13 DIAGNOSIS — R2689 Other abnormalities of gait and mobility: Secondary | ICD-10-CM | POA: Diagnosis not present

## 2015-11-13 DIAGNOSIS — S82841A Displaced bimalleolar fracture of right lower leg, initial encounter for closed fracture: Secondary | ICD-10-CM | POA: Diagnosis not present

## 2015-11-13 DIAGNOSIS — G40401 Other generalized epilepsy and epileptic syndromes, not intractable, with status epilepticus: Secondary | ICD-10-CM | POA: Diagnosis not present

## 2015-11-13 DIAGNOSIS — R296 Repeated falls: Secondary | ICD-10-CM | POA: Diagnosis not present

## 2015-11-13 MED ORDER — DIAZEPAM 2 MG PO TABS: 2.0000 mg | ORAL_TABLET | Freq: Four times a day (QID) | ORAL | Status: DC | PRN

## 2015-11-13 MED ORDER — LEVETIRACETAM 500 MG PO TABS: 500.0000 mg | ORAL_TABLET | Freq: Two times a day (BID) | ORAL | Status: DC

## 2015-11-13 MED ORDER — ENSURE ENLIVE PO LIQD: 237.0000 mL | Freq: Two times a day (BID) | ORAL | Status: DC

## 2015-11-13 MED ORDER — HYDROCODONE-ACETAMINOPHEN 5-325 MG PO TABS: ORAL_TABLET | ORAL | Status: DC

## 2015-11-13 MED ORDER — DIVALPROEX SODIUM ER 500 MG PO TB24: 500.0000 mg | ORAL_TABLET | Freq: Two times a day (BID) | ORAL | Status: DC

## 2015-11-13 NOTE — Care Management Note (Signed)
Case Management Note  Patient Details  Name: Maria [name] MRN: [id number] Date of Birth: [birth year]  Subjective/Objective:                    Action/Plan: Plan is to d/c to SNF today.  Expected Discharge Date:   11/13/2015             Expected Discharge Plan:  SNF  In-House Referral:   CSW  Discharge planning Services  CM Consult  Post Acute Care Choice:    Choice offered to:     DME Arranged:    DME Agency:     HH Arranged:    HH Agency:     Status of Service:  Completed, signed off  Medicare Important Message Given:    Date Medicare IM Given:    Medicare IM give by:    Date Additional Medicare IM Given:    Additional Medicare Important Message give by:     If discussed at [place] of Stay Meetings, dates discussed:    Additional Comments:  Sharin Mons, RN 11/13/2015, 10:55 AM

## 2015-11-13 NOTE — Discharge Summary (Signed)
Physician Discharge Summary  Maria [name] [id number] DOB: 12-09-1960 DOA: 11/09/2015  PCP: [place] date: 11/09/2015 Discharge date: 11/13/2015  Recommendations for Outpatient Follow-up:  1. New medication is Keppra. Please continue Vimapt and Depakote.   Discharge Diagnoses:  Active Problems:   Frequent falls   Fall   Myoclonus   Acute encephalopathy   Discharge Condition: stable   Diet recommendation: as tolerated   History of present illness:  55 yo female with history of mental retardation, epilepsy who presented to [facility] with recurrent falls, recently had fall that resulted in broken ankle and she was subsequently sent home with a boot. She continued to have falls with noticeable decline in her motor function and mentation. She was referred to hospital this time by her neurologist due to possible recurrent seizures.    Hospital Course:    Assessment & Plan:   Active Problems: Frequent, recurrent falls / Acute metabolic encephalopathy  - Possibly from recurrent seizures - Ammonia WNL - CT head showed encephalomalacia in the inferomedial right frontal lobe. - EEG showed interictal expression of a generalized epilepsy - PT evaluation - SNF placement   Seizures - No reports of seizures in hospital so far - Continue keppra, vimpat and Depakote level - Depakote level WNL - EEG consistent with the interictal expression of a generalized epilepsy. No seizure was recorded on this study - Neurology saw the pt in consultation     DVT prophylaxis: Lovenox subQ Code Status: full code  Family Communication: sister at the bedside this am    Consultants:   Neurology  Procedures:   EEG 11/10/2015 - EEG is consistent with the interictal expression of a generalized epilepsy. No seizure was recorded on this study.  Antimicrobials:   None   Signed:  Leisa Lenz, MD  [facility] 11/13/2015, 9:20 AM  Pager #:  [phone]  Time spent in minutes: less than 30 minutes   Discharge Exam: Filed Vitals:   11/12/15 2112 11/13/15 0521  BP: 113/59 122/63  Pulse: 66 72  Temp: 98.2 F (36.8 C) 97.8 F (36.6 C)  Resp: 16 16   Filed Vitals:   11/12/15 0509 11/12/15 1315 11/12/15 2112 11/13/15 0521  BP: 139/68 135/75 113/59 122/63  Pulse: 79 81 66 72  Temp:  97.8 F (36.6 C) 98.2 F (36.8 C) 97.8 F (36.6 C)  TempSrc:  Oral Oral Oral  Resp: 16 19 16 16   Height:      Weight:      SpO2: 97% 100% 99% 99%    General: Pt is alert, follows commands appropriately, not in acute distress Cardiovascular: Regular rate and rhythm, S1/S2 + Respiratory: Clear to auscultation bilaterally, no wheezing, no crackles, no rhonchi Abdominal: Soft, non tender, non distended, bowel sounds +, no guarding Extremities: no edema, no cyanosis, pulses palpable bilaterally DP and PT Neuro: Grossly nonfocal  Discharge Instructions  Discharge Instructions    Call MD for:  difficulty breathing, headache or visual disturbances    Complete by:  As directed      Call MD for:  persistant dizziness or light-headedness    Complete by:  As directed      Call MD for:  persistant nausea and vomiting    Complete by:  As directed      Call MD for:  severe uncontrolled pain    Complete by:  As directed      Diet - low sodium heart healthy    Complete by:  As directed      Increase activity slowly    Complete by:  As directed             Medication List    STOP taking these medications        ibuprofen 600 MG tablet  Commonly known as:  ADVIL,MOTRIN     oxyCODONE-acetaminophen 5-325 MG tablet  Commonly known as:  PERCOCET      TAKE these medications        CALCIUM 500 +D 500-400 MG-UNIT Tabs  Generic drug:  Calcium Carb-Cholecalciferol  Take 1 tablet by mouth 2 (two) times daily.     cholecalciferol 1000 units tablet  Commonly known as:  VITAMIN D  Take 1,000 Units by mouth daily. Reported on 05/30/2015      diazepam 2 MG tablet  Commonly known as:  VALIUM  Take 1 tablet (2 mg total) by mouth every 6 (six) hours as needed for anxiety.     divalproex 500 MG 24 hr tablet  Commonly known as:  DEPAKOTE ER  Take 1 tablet (500 mg total) by mouth 2 (two) times daily.     docusate sodium 100 MG capsule  Commonly known as:  COLACE  Take 100 mg by mouth daily.     estradiol 0.075 MG/24HR  Commonly known as:  VIVELLE-DOT  Place 1 patch onto the skin 2 (two) times a week. On Wednesday and Saturday     feeding supplement (ENSURE ENLIVE) Liqd  Take 237 mLs by mouth 2 (two) times daily between meals.     HYDROcodone-acetaminophen 5-325 MG tablet  Commonly known as:  NORCO/VICODIN  Take 1 tablet my mouth every 4 hours as needed for pain.     lacosamide 200 MG Tabs tablet  Commonly known as:  VIMPAT  Take 1 tablet (200 mg total) by mouth 2 (two) times daily.     levETIRAcetam 500 MG tablet  Commonly known as:  KEPPRA  Take 1 tablet (500 mg total) by mouth 2 (two) times daily.     levocetirizine 5 MG tablet  Commonly known as:  XYZAL  Take 5 mg by mouth every evening.     omeprazole 40 MG capsule  Commonly known as:  PRILOSEC  Take 1 capsule (40 mg total) by mouth daily.     oxybutynin 5 MG 24 hr tablet  Commonly known as:  DITROPAN-XL  Take 5 mg by mouth daily.     pediatric multivitamin chewable tablet  Chew 1 tablet by mouth daily.     TUMS PO  Take 1 tablet by mouth daily as needed (heartburn).           Follow-up Information    Follow up with [facility]. Schedule an appointment as soon as possible for a visit in 1 week.   Specialty:  Family Medicine   Why:  Follow up appt after recent hospitalization   Contact information:   [address] [place] [address] [phone]        The results of significant diagnostics from this hospitalization (including imaging, microbiology, ancillary and laboratory) are listed below for reference.     Significant Diagnostic Studies: Dg Pelvis 1-2 Views  11/08/2015  CLINICAL DATA:  55 year old female status post fall today. Initial encounter. EXAM: PELVIS - 1-2 VIEW COMPARISON:  CT Abdomen and Pelvis 05/26/2015 and earlier. FINDINGS: Femoral heads are normally located. Chronic acetabular degenerative spurring is not significantly changed. Grossly intact proximal femurs. Pelvis bone mineralization  is normal. Pelvis appears intact. Grossly intact sacral ala and SI joints. Negative visible bowel gas pattern. Chronic right hemipelvis phlebolith. IMPRESSION: No acute fracture or dislocation identified about the pelvis. Electronically Signed   By: Genevie Ann M.D.   On: 11/08/2015 15:42   Dg Ankle Complete Right  11/08/2015  CLINICAL DATA:  Twisting injury with pain of right ankle. Initial encounter. EXAM: RIGHT ANKLE - COMPLETE 3+ VIEW COMPARISON:  Right tibia and fibula films on 09/14/2009 FINDINGS: There are acute fractures of both the lateral and medial malleoli. Oblique fracture of the distal fibula shows mild displacement. Transverse fracture of the medial malleolus shows mild displacement along the medial cortex. The ankle mortise shows normal alignment. No posterior malleolar fracture identified. Diffuse soft tissue swelling identified. IMPRESSION: Mildly displaced fractures of both the right lateral and medial malleoli. Electronically Signed   By: Aletta Edouard M.D.   On: 11/08/2015 14:00   Dg Ankle Complete Right  10/27/2015  CLINICAL DATA:  Unwitnessed fall at home.  Right ankle pain. EXAM: RIGHT ANKLE - COMPLETE 3+ VIEW COMPARISON:  None. FINDINGS: Transverse fracture at the base of the medial malleolus. There is an oblique fracture through the distal fibula, best seen on the lateral view. Ankle mortise appears intact. Mild diffuse soft tissue swelling. IMPRESSION: Distal fibular and medial malleolar fractures. Electronically Signed   By: Rolm Baptise M.D.   On: 10/27/2015 07:41   Ct Head Wo  Contrast  11/08/2015  CLINICAL DATA:  Pain following fall EXAM: CT HEAD WITHOUT CONTRAST CT CERVICAL SPINE WITHOUT CONTRAST TECHNIQUE: Multidetector CT imaging of the head and cervical spine was performed following the standard protocol without intravenous contrast. Multiplanar CT image reconstructions of the cervical spine were also generated. COMPARISON:  CT head and CT cervical spine Oct 30, 2014 FINDINGS: CT HEAD FINDINGS There is mild stable supratentorial atrophy with moderate cerebellar atrophy, stable. There is no intracranial mass, hemorrhage, extra-axial fluid collection, or midline shift. There is stable encephalomalacia in the medial inferior right frontal lobe, stable. Elsewhere gray-white compartments appear normal. No acute infarct evident. There is a right frontal scalp hematoma. The bony calvarium appears intact. A prominent venous Lake is noted in the right frontal bone, stable. There is opacification of several inferior mastoid air cells bilaterally. Most of the mastoid air cells are clear. No intraorbital lesions are evident. There is rightward deviation of the nasal septum. There is debris in the right external auditory canal. CT CERVICAL SPINE FINDINGS There is no fracture or spondylolisthesis. Prevertebral soft tissues and predental space regions are normal. There is moderate disc space narrowing at C5-6 and C6-7. There is slightly milder narrowing at all other levels. There is multilevel facet osteoarthritic change. There is exit foraminal narrowing due to bony hypertrophy at C3-4 bilaterally, C4-5 on the right, C5-6 bilaterally, and C6-7 on the left. There is impression on the exiting nerve roots at C4-5 on the right and C5-6 bilaterally. IMPRESSION: CT head: Atrophy, greatest in the cerebellar region. Encephalomalacia in the inferomedial right frontal lobe. No intracranial mass, hemorrhage, extra-axial fluid collection, or acute infarct. There is a right frontal scalp hematoma. Mild chronic  mastoid disease bilaterally is stable. There is probable cerumen in the right external auditory canal. CT cervical spine: Acute no acute fracture or spondylolisthesis. Multilevel arthropathy. Impression on several exiting nerve roots is noted due to bony hypertrophy. Electronically Signed   By: Lowella Grip III M.D.   On: 11/08/2015 16:06   Ct Cervical Spine Wo  Contrast  11/08/2015  CLINICAL DATA:  Pain following fall EXAM: CT HEAD WITHOUT CONTRAST CT CERVICAL SPINE WITHOUT CONTRAST TECHNIQUE: Multidetector CT imaging of the head and cervical spine was performed following the standard protocol without intravenous contrast. Multiplanar CT image reconstructions of the cervical spine were also generated. COMPARISON:  CT head and CT cervical spine Oct 30, 2014 FINDINGS: CT HEAD FINDINGS There is mild stable supratentorial atrophy with moderate cerebellar atrophy, stable. There is no intracranial mass, hemorrhage, extra-axial fluid collection, or midline shift. There is stable encephalomalacia in the medial inferior right frontal lobe, stable. Elsewhere gray-white compartments appear normal. No acute infarct evident. There is a right frontal scalp hematoma. The bony calvarium appears intact. A prominent venous [place] is noted in the right frontal bone, stable. There is opacification of several inferior mastoid air cells bilaterally. Most of the mastoid air cells are clear. No intraorbital lesions are evident. There is rightward deviation of the nasal septum. There is debris in the right external auditory canal. CT CERVICAL SPINE FINDINGS There is no fracture or spondylolisthesis. Prevertebral soft tissues and predental space regions are normal. There is moderate disc space narrowing at C5-6 and C6-7. There is slightly milder narrowing at all other levels. There is multilevel facet osteoarthritic change. There is exit foraminal narrowing due to bony hypertrophy at C3-4 bilaterally, C4-5 on the right, C5-6  bilaterally, and C6-7 on the left. There is impression on the exiting nerve roots at C4-5 on the right and C5-6 bilaterally. IMPRESSION: CT head: Atrophy, greatest in the cerebellar region. Encephalomalacia in the inferomedial right frontal lobe. No intracranial mass, hemorrhage, extra-axial fluid collection, or acute infarct. There is a right frontal scalp hematoma. Mild chronic mastoid disease bilaterally is stable. There is probable cerumen in the right external auditory canal. CT cervical spine: Acute no acute fracture or spondylolisthesis. Multilevel arthropathy. Impression on several exiting nerve roots is noted due to bony hypertrophy. Electronically Signed   By: Lowella Grip III M.D.   On: 11/08/2015 16:06   Dg Knee Complete 4 Views Right  11/08/2015  CLINICAL DATA:  Status post fall today with a blow to the right knee. Pain. Initial encounter. EXAM: RIGHT KNEE - COMPLETE 4+ VIEW COMPARISON:  None. FINDINGS: No evidence of fracture, dislocation, or joint effusion. No evidence of arthropathy or other focal bone abnormality. Soft tissues are unremarkable. IMPRESSION: Negative exam. Electronically Signed   By: Inge Rise M.D.   On: 11/08/2015 13:55   Dg Knee Complete 4 Views Right  10/27/2015  CLINICAL DATA:  Unwitnessed fall at home.  Bilateral knee pain. EXAM: RIGHT KNEE - COMPLETE 4+ VIEW COMPARISON:  None. FINDINGS: No acute bony abnormality. Specifically, no fracture, subluxation, or dislocation. Soft tissues are intact. Joint spaces are maintained. Normal bone mineralization. No visible significant joint effusion P IMPRESSION: No acute bony abnormality. Electronically Signed   By: Rolm Baptise M.D.   On: 10/27/2015 07:41    Microbiology: No results found for this or any previous visit (from the past 240 hour(s)).   Labs: Basic Metabolic Panel:  Recent Labs Lab 11/08/15 1509 11/09/15 1727  NA 138 138  K 3.6 3.9  CL 104 105  CO2 25 27  GLUCOSE 102* 96  BUN 19 17   CREATININE 0.87 0.84  CALCIUM 9.3 9.8   Liver Function Tests:  Recent Labs Lab 11/10/15 1417  ALBUMIN 3.5   No results for input(s): LIPASE, AMYLASE in the last 168 hours.  Recent Labs Lab 11/09/15 2043  AMMONIA 29   CBC:  Recent Labs Lab 11/08/15 1509 11/09/15 1727  WBC 7.0 5.8  HGB 12.1 11.5*  HCT 36.4 36.1  MCV 96.3 97.0  PLT 215 225   Cardiac Enzymes: No results for input(s): CKTOTAL, CKMB, CKMBINDEX, TROPONINI in the last 168 hours. BNP: BNP (last 3 results) No results for input(s): BNP in the last 8760 hours.  ProBNP (last 3 results) No results for input(s): PROBNP in the last 8760 hours.  CBG: No results for input(s): GLUCAP in the last 168 hours.

## 2015-11-13 NOTE — Progress Notes (Signed)
Physical Therapy Treatment Patient Details Name: Maria [name] MRN: [id number] DOB: 10-01-1960 Today's Date: 11/13/2015    History of Present Illness 55 y.o. female admitted to [facility] on 11/09/15 for recurrent falls.  Suspected new seizure activity in the setting of epilepsy.  Pt with significant PMhx of recent fall with L ankle fx (now in boot for gait), MR, dysphagia, tremor, and thrombocytopenia.    PT Comments    Patient is currently min guard/min A for OOB mobility and needs max vc for safety. Sister present for session. Current plan remains appropriate.   Follow Up Recommendations  SNF     Equipment Recommendations  None recommended by PT    Recommendations for Other Services       Precautions / Restrictions Precautions Precautions: Fall Precaution Comments: pt with recent history of frequent falls Restrictions Weight Bearing Restrictions: No    Mobility  Bed Mobility               General bed mobility comments: on BSC with sister present upon arrival  Transfers Overall transfer level: Needs assistance Equipment used: Rolling walker (2 wheeled) Transfers: Sit to/from Stand Sit to Stand: Min assist         General transfer comment: assist for safe use of AD and guidance for safe descent to recliner with patient sitting prematurely; max cues for safety  Ambulation/Gait Ambulation/Gait assistance: Min guard Ambulation Distance (Feet): 175 Feet Assistive device: Rolling walker (2 wheeled) Gait Pattern/deviations: Step-to pattern;Decreased stance time - right;Decreased step length - left;Decreased weight shift to right;Trunk flexed     General Gait Details: cues for bilat step length, posture, and position of RW   Stairs            Wheelchair Mobility    Modified Rankin (Stroke Patients Only)       Balance Overall balance assessment: Needs assistance Sitting-balance support: No upper extremity supported;Feet supported Sitting balance-Leahy  Scale: Good     Standing balance support: Bilateral upper extremity supported Standing balance-Leahy Scale: Poor                      Cognition Arousal/Alertness: Awake/alert Behavior During Therapy: WFL for tasks assessed/performed (mom reports this is normal) Overall Cognitive Status: History of cognitive impairments - at baseline                      Exercises      General Comments General comments (skin integrity, edema, etc.): sister present for session      Pertinent Vitals/Pain Pain Assessment: Faces Faces Pain Scale: Hurts little more Pain Location: R ankle with ambulation Pain Descriptors / Indicators: Sore;Guarding Pain Intervention(s): Limited activity within patient's tolerance;Monitored during session;Repositioned    Home Living                      Prior Function            PT Goals (current goals can now be found in the care plan section) Acute Rehab PT Goals Patient Stated Goal: none stated but agreeable to SNF rehab before  home PT Goal Formulation: With family Time For Goal Achievement: 11/24/15 Potential to Achieve Goals: Fair Progress towards PT goals: Progressing toward goals    Frequency  Min 3X/week    PT Plan Current plan remains appropriate    Co-evaluation             End of Session Equipment Utilized During Treatment: Other (  comment);Gait belt (R CAM boot) Activity Tolerance: Patient tolerated treatment well Patient left: in chair;with call bell/phone within reach;with family/visitor present     Time: 1007-1023 PT Time Calculation (min) (ACUTE ONLY): 16 min  Charges:  $Gait Training: 8-22 mins                    G Codes:  Functional Assessment Tool Used: assist level Functional Limitation: Mobility: Walking and moving around Mobility: Walking and Moving Around Current Status [phone]): At least 20 percent but less than 40 percent impaired, limited or restricted Mobility: Walking and Moving Around  Goal Status [phone]): At least 1 percent but less than 20 percent impaired, limited or restricted   Salina April, PTA Pager: [phone]   11/13/2015, 11:58 AM

## 2015-11-13 NOTE — Progress Notes (Signed)
PASAR #: [id number] Radonna Ricker [phone]

## 2015-11-13 NOTE — Progress Notes (Signed)
Pt's sister at the bedside was given d/c instructions. Christina Smith,LPN at the [facility] was given report. Pt's prescriptions were sent with her. CCMD notified of pt being d/c'd.  Hoover Brunette, RN

## 2015-11-13 NOTE — Care Management Important Message (Signed)
Important Message  Patient Details  Name: Maria [name] MRN: [id number] Date of Birth: April 26, 1961   Medicare Important Message Given:  Yes    Nathen May 11/13/2015, 11:16 AM

## 2015-11-13 NOTE — Progress Notes (Signed)
Patient will DC to: [facility] Anticipated DC date: 11/13/15 Family notified: Sister at bedside Transport by: Sister by car   Per MD patient ready for DC to [facility]. RN, patient, patient's family, and facility notified of DC. RN given number for report. DC med hard scripts paper clipped on chart.   CSW signing off.  Cedric Fishman, [place] Social Worker [phone]

## 2015-11-13 NOTE — Discharge Instructions (Signed)
Myoclonus Myoclonus is a term that refers to brief, involuntary twitching or jerking of a muscle or a group of muscles. It describes a symptom, and generally, is not a diagnosis of a disease. The myoclonic twitches or jerks are usually caused by sudden muscle contractions. They also can result from brief lapses of contraction. Myoclonic twitches or jerks may occur:  Alone or in sequence.  In a pattern or without pattern.  Infrequently or many times each minute. Often times, myoclonus is one of several symptoms in a wide variety of nervous system disorders such as:  Multiple sclerosis.  Parkinson's disease.  Alzheimer's disease.  Creutzfeldt-Jakob disease. Familiar examples of normal myoclonus include:  Hiccups and jerks.  "Sleep starts" that some people have while drifting off to sleep. Severe cases can severely limit a person's ability to:  Eat.  Talk.  Walk. Myoclonic jerks commonly occur in individuals with epilepsy. The most common types of myoclonus include:  Action.  Cortical reflex.  Essential.  Palatal.  Progressive myoclonus epilepsy.  Reticular reflex.  Sleep.  Stimulus-sensitive. TREATMENT  Treatment for myoclonus consists of medicines that may help reduce symptoms. These drugs (many of which are also used to treat epilepsy) include:   Barbiturates.  Clonazepam.  Phenytoin.  Primidone.  Sodium valproate. The complex origins of myoclonus may require the use of multiple drugs.   This information is not intended to replace advice given to you by your health care provider. Make sure you discuss any questions you have with your health care provider.   Document Released: 05/10/2002 Document Revised: 08/12/2011 Document Reviewed: 04/22/2013 Elsevier Interactive Patient Education 2016 Elsevier Inc. Levetiracetam extended-release tablets What is this medicine? LEVETIRACETAM (lee ve tye RA se tam) is an antiepileptic drug. It is used with other  medicines to treat certain types of seizures. This medicine may be used for other purposes; ask your health care provider or pharmacist if you have questions. What should I tell my health care provider before I take this medicine? They need to know if you have any of these conditions: -kidney disease -suicidal thoughts, plans, or attempt; a previous suicide attempt by you or a family member -an unusual or allergic reaction to levetiracetam, other medicines, foods, dyes, or preservatives -pregnant or trying to get pregnant -breast-feeding How should I use this medicine? Take this medicine by mouth with a glass of water. Follow the directions on the prescription label. Do not cut, crush or chew this medicine. You may take this medicine with or without food. Take your doses at regular intervals. Do not take your medicine more often than directed. Do not stop taking this medicine or any of your seizure medicines unless instructed by your doctor or health care professional. Stopping your medicine suddenly can increase your seizures or their severity. A special MedGuide will be given to you by the pharmacist with each prescription and refill. Be sure to read this information carefully each time. Contact your pediatrician or health care professional regarding the use of this medication in children. While this drug may be prescribed for children as young as 57 years of age for selected conditions, precautions do apply. Overdosage: If you think you have taken too much of this medicine contact a poison control center or emergency room at once. NOTE: This medicine is only for you. Do not share this medicine with others. What if I miss a dose? If you miss a dose and it has only been a few hours, take it as soon as you  can. If it is almost time for your next dose, take only that dose. Do not take double or extra doses. What may interact with this medicine? This medicine may interact with the following  medications: -carbamazepine -colesevelam -probenecid -sevelamer This list may not describe all possible interactions. Give your health care provider a list of all the medicines, herbs, non-prescription drugs, or dietary supplements you use. Also tell them if you smoke, drink alcohol, or use illegal drugs. Some items may interact with your medicine. What should I watch for while using this medicine? Visit your doctor or health care professional for a regular check on your progress. Wear a medical identification bracelet or chain to say you have epilepsy, and carry a card that lists all your medications. It is important to take this medicine exactly as instructed by your health care professional. When first starting treatment, your dose may need to be adjusted. It may take weeks or months before your dose is stable. You should contact your doctor or health care professional if your seizures get worse or if you have any new types of seizures. You may get drowsy or dizzy. Do not drive, use machinery, or do anything that needs mental alertness until you know how this medicine affects you. Do not stand or sit up quickly, especially if you are an older patient. This reduces the risk of dizzy or fainting spells. Alcohol may interfere with the effect of this medicine. Avoid alcoholic drinks. The use of this medicine may increase the chance of suicidal thoughts or actions. Pay special attention to how you are responding while on this medicine. Any worsening of mood, or thoughts of suicide or dying should be reported to your health care professional right away. The tablet shell for some brands of this medicine does not dissolve. This is normal. The tablet shell may appear in the stool. This is not cause for concern. Women who become pregnant while using this medicine may enroll in the [place] Pregnancy Registry by calling [phone]. This registry collects information about the safety  of antiepileptic drug use during pregnancy. What side effects may I notice from receiving this medicine? Side effects you should report to your doctor or health care professional as soon as possible: -allergic reactions like skin rash, itching or hives, swelling of the face, lips, or tongue -breathing problems -changes in emotions or moods -dark urine -general ill feeling or flu-like symptoms -problems with balance, talking, walking -suicidal thoughts or actions -unusually weak or tired -yellowing of the eyes or skin Side effects that usually do not require medical attention (report to your doctor or health care professional if they continue or are bothersome): -diarrhea -dizzy, drowsy -headache -loss of appetite This list may not describe all possible side effects. Call your doctor for medical advice about side effects. You may report side effects to FDA at 1-800-FDA-1088. Where should I keep my medicine? Keep out of reach of children. Store at room temperature between 15 and 30 degrees C (59 and 86 degrees F). Throw away any unused medicine after the expiration date. NOTE: This sheet is a summary. It may not cover all possible information. If you have questions about this medicine, talk to your doctor, pharmacist, or health care provider.    2016, Elsevier/Gold Standard. (2014-09-12 10:13:38)

## 2015-11-14 DIAGNOSIS — G9349 Other encephalopathy: Secondary | ICD-10-CM | POA: Diagnosis not present

## 2015-11-14 DIAGNOSIS — F445 Conversion disorder with seizures or convulsions: Secondary | ICD-10-CM | POA: Diagnosis not present

## 2015-11-14 DIAGNOSIS — G40401 Other generalized epilepsy and epileptic syndromes, not intractable, with status epilepticus: Secondary | ICD-10-CM | POA: Diagnosis not present

## 2015-11-14 DIAGNOSIS — K219 Gastro-esophageal reflux disease without esophagitis: Secondary | ICD-10-CM | POA: Diagnosis not present

## 2015-11-15 ENCOUNTER — Telehealth: Payer: Self-pay | Admitting: Neurology

## 2015-11-15 DIAGNOSIS — R569 Unspecified convulsions: Secondary | ICD-10-CM

## 2015-11-15 MED ORDER — DIVALPROEX SODIUM ER 500 MG PO TB24: 500.0000 mg | ORAL_TABLET | Freq: Two times a day (BID) | ORAL | Status: DC

## 2015-11-15 MED ORDER — LACOSAMIDE 200 MG PO TABS: 200.0000 mg | ORAL_TABLET | Freq: Two times a day (BID) | ORAL | Status: DC

## 2015-11-15 NOTE — Telephone Encounter (Addendum)
Mom called to advise, daughter admitted to hospital last week, states Doctor there started patient on new medication amphetamine-dextroamphetamine (ADDERALL XR) 10 MG 24 hr capsule for seizures, states "she is showing bad signs from it, increased irritability, tiredness, not eating much, unusual mood, behavior changes, sounds paranoid". Nurse at [facility] in [place] advised Mom that patient is uncooperative. Mom is concerned that this medication can cause suicidal thoughts. Mom advised staff at [facility] to "stop this medication, not to give her anymore of this medicine". Mom states that "she is terribly upset about her condition". Please call to advise. Mom also requests refill of lacosamide (VIMPAT) 200 MG TABS tablet to [facility], states daughter is getting low on this medication and [facility] does not have this medication at their facility.

## 2015-11-15 NOTE — Telephone Encounter (Signed)
The patient should in any case continue taking Vimpat and Depakote. Apparently these 2 drugs have not controlled seizure sufficiently. Keppra was added and the patient was hospitalized because of seizures. It is entirely possible that Her causes the new psychiatric and personality difficulties the patient displayed. Since the medication has only been given for 4 days or less than that there is no weaning necessary. Keppra can be discontinued. I also have a note here stating that the patient was started on Adderall? This is not given for seizures and can cause irritability anxiety panic attacks.

## 2015-11-15 NOTE — Telephone Encounter (Signed)
I had a lengthy phone call with pt's mother. Pt's mother reports that pt was started on keppra on 11/13/2015 by the hospitalist upon discharge from the hospital for seizures. Pt is already taking vimpat and depakote. She says that the hospitalist reviewed the side effects of keppra with the pt's mother and it included increase in irrability and suicidal thoughts. Pt is currently residing at [facility] in [place]. Pt has been very irritable, and the staff at the [facility] have labeled her as "uncooperative". Pt's mother in insistent that this is related to the keppra, so pt's mother has instructed [facility] to not give her any more keppra, and called out office to have another medication for seizures given to pt. I advised pt's mother that Dr. Jannifer Franklin is out of the office but I can send this request for a medication change to the [facility] physician.  Pt's mother is also requesting a refill on vimpat.

## 2015-11-16 NOTE — Telephone Encounter (Signed)
I called pt's home number again, no answer, just kept ringing, no voicemail.

## 2015-11-16 NOTE — Telephone Encounter (Signed)
I called pt's mother to discuss. Home number kept ringing, no VM picked up. I called cell number, left a VM asking her to call me back.

## 2015-11-16 NOTE — Telephone Encounter (Signed)
I called pt's home number, again, no answer, just kept ringing.

## 2015-11-17 MED ORDER — CLONAZEPAM 0.5 MG PO TABS: 0.5000 mg | ORAL_TABLET | Freq: Two times a day (BID) | ORAL | Status: DC

## 2015-11-17 NOTE — Telephone Encounter (Addendum)
Spoke to pt's mother and advised to stop Keppra but continue Vimpat and Depakote per Dr. Brett Fairy. She said that she had already told the staff at [facility] not to give her daughter the [place] any more d/t side effects. But then, she had second thoughts and voiced concerns about pt having another seizure if the Keppra was stopped. Feels that she had a seizure resulting in a broken leg while she was on both the Vimpat and Depakote. She would like to try another anti-seizure med, instead of the Keppra (but in addition to Vimpat and Depakote) that may not have the irritability side effects. Let pt's mother know that any medication prescribed does have risk of possible side effects.  Also, called and talked to nurse, Sharyn Lull, @ [facility]. She requests a faxed order to d/c Keppra, in addition to orders for any new anti-seizure medication (if recommended). Reports that pt is not on Adderall. Order to discontinue Keppra faxed to # [phone]

## 2015-11-17 NOTE — Telephone Encounter (Signed)
Prescription faxed over to [facility]. I put the original prescription on Maria [name]'s desk just in case it is needed. Thanks.  Rosalin Hawking, MD PhD Stroke Neurology 11/17/2015 3:55 PM

## 2015-11-17 NOTE — Addendum Note (Signed)
Addended by: Rosalin Hawking on: 11/17/2015 03:45 PM   Modules accepted: Orders

## 2015-11-17 NOTE — Telephone Encounter (Signed)
Talked with mom and the nurse taking care of pt together. Instructed nurse that discontinue the keppra and start pt on clonazepam 0.5mg  bid. I will fax over the prescription with 0.5mg  bid for 30 days with 2 refills.   Pt will need to continue vimpat and depakote. Her recent depakote level was therapeutic. vimpat on max dose. Other AEDs can be considered including lamictal, zonegran, neurontin or topamax. However, lamictal has interaction with depakote, not good choice. zonegran works similar to [place] pharmacologically. neurontin is low potency in terms of seizure, and may also has side effects of drowsy, sleepy, dizziness. topamax seems also has side effects of confusion, altered mental status. Clonazepam so far is a better choice to my understanding. But Dr. Jannifer Franklin will make final call when he comes back.  Rosalin Hawking, MD PhD Stroke Neurology 11/17/2015 3:42 PM

## 2015-11-17 NOTE — Telephone Encounter (Signed)
Pt's mother called back again. Says that she would like pt to be put back on Keppra through the weekend if another anti-seizure medication is not prescribed.

## 2015-11-17 NOTE — Addendum Note (Signed)
Addended by: Monte Fantasia on: 11/17/2015 09:28 AM   Modules accepted: Orders, Medications

## 2015-11-28 NOTE — ED Provider Notes (Addendum)
CSN: [id number]     Arrival date & time 10/27/15  [id number] History   First MD Initiated Contact with Patient 10/27/15 0700     Chief Complaint  Patient presents with  . Fall     (Consider location/radiation/quality/duration/timing/severity/associated sxs/prior Treatment) HPI....level 5 caveat for mental retardation.  Chief complaint is external mechanical fall resulting in pain to right ankle and right knee. No head or neck trauma. No abdominal or chest trauma. Patient has had her normal level of alertness. Severity of pain is moderate.  Past Medical History  Diagnosis Date  . GERD (gastroesophageal reflux disease)   . Mental retardation     lesion in head  . Dysphagia   . Constipation   . Seizures ([employer])     "not fully controlled on max doses of meds" (Neuro ofc note 12/2014)  . Thrombocytopenia ([place])   . Tremor    Past Surgical History  Procedure Laterality Date  . Total abdominal hysterectomy    . Tonsillectomy and adenoidectomy    . Skin graft to gum Right 08/2013  . Cataract extraction      both eyes, May of 2015  . Mouth surgery    . Esophagogastroduodenoscopy (egd) with propofol N/A 09/16/2014    Procedure: ESOPHAGOGASTRODUODENOSCOPY (EGD) WITH PROPOFOL;  Surgeon: Rogene Houston, MD;  Location: [facility];  Service: Endoscopy;  Laterality: N/A;  . Esophageal dilation N/A 09/16/2014    Procedure: ESOPHAGEAL DILATION WITH 54FR MALONEY DILATOR;  Surgeon: Rogene Houston, MD;  Location: [facility];  Service: Endoscopy;  Laterality: N/A;  . Biopsy  09/16/2014    Procedure: BIOPSY;  Surgeon: Rogene Houston, MD;  Location: [facility];  Service: Endoscopy;;   Family History  Problem Relation Age of Onset  . High Cholesterol Mother   . High blood pressure Mother   . Diabetes Father    Social History  Substance Use Topics  . Smoking status: Former Smoker -- 1.50 packs/day for 15 years    Types: Cigarettes    Quit date: 05/22/2006  . Smokeless tobacco: Never Used  . Alcohol Use: No   OB  History    No data available     Review of Systems  Reason unable to perform ROS: Mental retardation.      Allergies  Codeine and Lamictal  Home Medications   Prior to Admission medications   Medication Sig Start Date End Date Taking? Authorizing Provider  Calcium Carb-Cholecalciferol (CALCIUM 500 +D) 500-400 MG-UNIT TABS Take 1 tablet by mouth 2 (two) times daily.   Yes Historical Provider, MD  Calcium Carbonate Antacid (TUMS PO) Take 1 tablet by mouth daily as needed (heartburn).    Yes Historical Provider, MD  cholecalciferol (VITAMIN D) 1000 UNITS tablet Take 1,000 Units by mouth daily. Reported on 05/30/2015   Yes Historical Provider, MD  estradiol (VIVELLE-DOT) 0.075 MG/24HR Place 1 patch onto the skin 2 (two) times a week. On Wednesday and Saturday   Yes Historical Provider, MD  levocetirizine (XYZAL) 5 MG tablet Take 5 mg by mouth every evening.    Yes Historical Provider, MD  omeprazole (PRILOSEC) 40 MG capsule Take 1 capsule (40 mg total) by mouth daily. 09/21/15  Yes Butch Penny, NP  oxybutynin (DITROPAN-XL) 5 MG 24 hr tablet Take 5 mg by mouth daily.   Yes Historical Provider, MD  Pediatric Multiple Vit-C-FA (PEDIATRIC MULTIVITAMIN) chewable tablet Chew 1 tablet by mouth daily.     Yes Historical Provider, MD  clonazePAM (KLONOPIN) 0.5 MG tablet  Take 1 tablet (0.5 mg total) by mouth 2 (two) times daily. 11/17/15   Rosalin Hawking, MD  diazepam (VALIUM) 2 MG tablet Take 1 tablet (2 mg total) by mouth every 6 (six) hours as needed for anxiety. 11/13/15   Robbie Lis, MD  divalproex (DEPAKOTE ER) 500 MG 24 hr tablet Take 1 tablet (500 mg total) by mouth 2 (two) times daily. 11/15/15   Asencion Partridge Dohmeier, MD  docusate sodium (COLACE) 100 MG capsule Take 100 mg by mouth daily.    Historical Provider, MD  feeding supplement, ENSURE ENLIVE, (ENSURE ENLIVE) LIQD Take 237 mLs by mouth 2 (two) times daily between meals. 11/13/15   Robbie Lis, MD  HYDROcodone-acetaminophen (NORCO/VICODIN)  5-325 MG tablet Take 1 tablet my mouth every 4 hours as needed for pain. 11/13/15   Robbie Lis, MD  lacosamide (VIMPAT) 200 MG TABS tablet Take 1 tablet (200 mg total) by mouth 2 (two) times daily. 11/15/15   Carmen Dohmeier, MD   BP 114/65 mmHg  Pulse 62  Temp(Src) 98.2 F (36.8 C) (Oral)  Resp 16  SpO2 100% Physical Exam  Constitutional:  Moderate distress  HENT:  Head: Normocephalic and atraumatic.  Eyes: Conjunctivae and EOM are normal. Pupils are equal, round, and reactive to light.  Neck: Normal range of motion. Neck supple.  Cardiovascular: Normal rate and regular rhythm.   Pulmonary/Chest: Effort normal and breath sounds normal.  Abdominal: Soft. Bowel sounds are normal.  Musculoskeletal:  Right lower extremity: Minimal tenderness in the anterior knee. Extreme tenderness in the distal fibula and medial malleolar area.  Neurological: She is alert.  Skin: Skin is warm and dry.  Psychiatric: She has a normal mood and affect.  Nursing note and vitals reviewed.   ED Course  Procedures (including critical care time) Labs Review Labs Reviewed - No data to display  Imaging Review No results found. I have personally reviewed and evaluated these images and lab results as part of my medical decision-making.   EKG Interpretation None      MDM   Final diagnoses:  Fall, initial encounter  Pott's fracture (of distal fibula), right, closed, initial encounter  Fracture of distal end of tibia, right, closed, initial encounter    Test results discussed with patient and her mother. Cam Walker applied. Additionally she has a regular walker at home. Follow-up with orthopedics.    Nat Christen, MD 11/30/15 [place], MD 02/23/16 [phone]

## 2015-11-30 DIAGNOSIS — S82841A Displaced bimalleolar fracture of right lower leg, initial encounter for closed fracture: Secondary | ICD-10-CM | POA: Diagnosis not present

## 2015-12-01 DIAGNOSIS — N390 Urinary tract infection, site not specified: Secondary | ICD-10-CM | POA: Diagnosis not present

## 2015-12-04 NOTE — Telephone Encounter (Signed)
I called and left a message, I will fax in the request for the Depakote level, the patient will need to be seen in revisit in this office in about 3 weeks.

## 2015-12-04 NOTE — Telephone Encounter (Addendum)
Mother called to "make absolute sure that Rx forclonazePAM (KLONOPIN) 0.5 MG tablet will be done". Mother wants a phone call back to know for sure if this will be taken care of. [phone]

## 2015-12-04 NOTE — Telephone Encounter (Signed)
Mother notified via TC. F/u appt scheduled 3 wks from today.

## 2015-12-04 NOTE — Telephone Encounter (Addendum)
Returned call but pt's mother was on another line. Left mssg w/ pt's brother to have mom call back to schedule follow-up appt in 3 weeks. Verbalized understanding and appreciation for call.

## 2015-12-04 NOTE — Telephone Encounter (Addendum)
Clonazepam recently refilled and faxed to [facility] by Dr. Erlinda Hong on 11/17/15 Most recent Depakote level was 101 drawn in hospital on 11/09/15

## 2015-12-04 NOTE — Telephone Encounter (Signed)
error 

## 2015-12-04 NOTE — Telephone Encounter (Signed)
Clonazepam rx faxed to [facility] (outpt pharmacy) F # [phone]

## 2015-12-04 NOTE — Telephone Encounter (Signed)
Copy of prescription of klonopin given to [facility]. It was done 11/17/2015 by Dr. Erlinda Hong work in for Dr. Jannifer Franklin.

## 2015-12-04 NOTE — Telephone Encounter (Signed)
Pt's mother called in requesting clonazePAM (KLONOPIN) 0.5 MG tablet be refilled, may send to [place], [place], and says she thinks her daughter will be safer at home and wants to sign her out. Mother thinks she should have lab work done for depakote level. She would like orders faxed to [facility], Phone: [phone], Fax [phone]

## 2015-12-07 ENCOUNTER — Telehealth: Payer: Self-pay | Admitting: Neurology

## 2015-12-07 ENCOUNTER — Other Ambulatory Visit: Payer: Self-pay | Admitting: Neurology

## 2015-12-07 DIAGNOSIS — G40909 Epilepsy, unspecified, not intractable, without status epilepticus: Secondary | ICD-10-CM | POA: Diagnosis not present

## 2015-12-07 NOTE — Telephone Encounter (Signed)
I called and talked with the mother. The patient seems to be fairly mobile getting around fairly well. Not sure she needs physical therapy at this time, can reassess when we see her this month.

## 2015-12-07 NOTE — Telephone Encounter (Signed)
Natasha/[facility] [place] called to get diagnosis codes for orders on pt. Pt is there waiting. I reached out to nurse who took call.

## 2015-12-07 NOTE — Telephone Encounter (Signed)
Pt arrived at [facility] for Depakote level. Gave ICD 10 code for seizure disorder (G40.909). Read-back performed. Nothing additional needed.

## 2015-12-07 NOTE — Telephone Encounter (Signed)
Pt was admitted to [facility] (11/09/15) due to frequent falls/seizures and discharged to [facility] (11/13/15). Then, discharged home per her mother's request (12/04/15). Follow-up appt scheduled in 3 wks.

## 2015-12-07 NOTE — Telephone Encounter (Signed)
Pt's mother has called in today about orders for home PT being sent to [facility]. May call mother at [phone]

## 2015-12-08 LAB — VALPROIC ACID LEVEL: Valproic Acid Lvl: 99 ug/mL (ref 50–100)

## 2015-12-12 ENCOUNTER — Telehealth: Payer: Self-pay

## 2015-12-12 NOTE — Telephone Encounter (Signed)
Called w/ unremarkable lab results. Instructed to continue current Depakote dose. Verbalized understanding and appreciation for call.

## 2015-12-12 NOTE — Telephone Encounter (Signed)
-----   Message from Kathrynn Ducking, MD sent at 12/12/2015  7:24 AM EDT -----  Therapeutic Depakote level, no change in dosing. Please call the patient. ----- Message -----    From: [facility] Lab Results In Interface    Sent: 12/11/2015  11:17 PM      To: Kathrynn Ducking, MD

## 2015-12-25 ENCOUNTER — Encounter: Payer: Self-pay | Admitting: Neurology

## 2015-12-25 ENCOUNTER — Ambulatory Visit (INDEPENDENT_AMBULATORY_CARE_PROVIDER_SITE_OTHER): Payer: Medicare Other | Admitting: Neurology

## 2015-12-25 VITALS — BP 106/67 | HR 72

## 2015-12-25 DIAGNOSIS — F79 Unspecified intellectual disabilities: Secondary | ICD-10-CM

## 2015-12-25 DIAGNOSIS — G40309 Generalized idiopathic epilepsy and epileptic syndromes, not intractable, without status epilepticus: Secondary | ICD-10-CM

## 2015-12-25 NOTE — Progress Notes (Signed)
Reason for visit:  seizures  Maria [name] is an 55 y.o. female  History of present illness:   Maria [name] is a 55 year old right-handed white female with a history of intractable seizures. The patient was admitted to the hospital on 11/09/15 with altered mental status associated with multiple seizures. The patient has had some adjustment of her seizure medications, clonazepam was added and seems to be helpful. The patient does have some drowsiness on the medication , but she has not had any recurrent seizures since last seen. She remains on the Vimpat and the Depakote. The patient has had a fracture of the right ankle, she still has some discomfort with this but it is getting much better, she is able to ambulate fairly well. She returns for an evaluation. A recent Depakote level was therapeutic.  Past Medical History:  Diagnosis Date  . Constipation   . Dysphagia   . Fracture    R foot  . GERD (gastroesophageal reflux disease)   . Mental retardation    lesion in head  . Seizures ([place])    "not fully controlled on max doses of meds" (Neuro ofc note 12/2014)  . Thrombocytopenia ([place])   . Tremor     Past Surgical History:  Procedure Laterality Date  . BIOPSY  09/16/2014   Procedure: BIOPSY;  Surgeon: Rogene Houston, MD;  Location: [facility];  Service: Endoscopy;;  . CATARACT EXTRACTION     both eyes, May of 2015  . ESOPHAGEAL DILATION N/A 09/16/2014   Procedure: ESOPHAGEAL DILATION WITH 54FR MALONEY DILATOR;  Surgeon: Rogene Houston, MD;  Location: [facility];  Service: Endoscopy;  Laterality: N/A;  . ESOPHAGOGASTRODUODENOSCOPY (EGD) WITH PROPOFOL N/A 09/16/2014   Procedure: ESOPHAGOGASTRODUODENOSCOPY (EGD) WITH PROPOFOL;  Surgeon: Rogene Houston, MD;  Location: [facility];  Service: Endoscopy;  Laterality: N/A;  . MOUTH SURGERY    . Skin graft to gum Right 08/2013  . TONSILLECTOMY AND ADENOIDECTOMY    . TOTAL ABDOMINAL HYSTERECTOMY      Family History  Problem Relation Age of Onset    . High Cholesterol Mother   . High blood pressure Mother   . Diabetes Father     Social history:  reports that she quit smoking about 9 years ago. Her smoking use included Cigarettes. She has a 22.50 pack-year smoking history. She has never used smokeless tobacco. She reports that she does not drink alcohol or use drugs.    Allergies  Allergen Reactions  . Codeine Nausea And Vomiting  . Lamictal [Lamotrigine] Rash    Medications:  Prior to Admission medications   Medication Sig Start Date End Date Taking? Authorizing Provider  Calcium Carb-Cholecalciferol (CALCIUM 500 +D) 500-400 MG-UNIT TABS Take 1 tablet by mouth 2 (two) times daily.   Yes Historical Provider, MD  Calcium Carbonate Antacid (TUMS PO) Take 1 tablet by mouth daily as needed (heartburn).    Yes Historical Provider, MD  cholecalciferol (VITAMIN D) 1000 UNITS tablet Take 1,000 Units by mouth daily. Reported on 05/30/2015   Yes Historical Provider, MD  clonazePAM (KLONOPIN) 0.5 MG tablet Take 1 tablet (0.5 mg total) by mouth 2 (two) times daily. 11/17/15  Yes Rosalin Hawking, MD  divalproex (DEPAKOTE ER) 500 MG 24 hr tablet Take 1 tablet (500 mg total) by mouth 2 (two) times daily. 11/15/15  Yes Carmen Dohmeier, MD  docusate sodium (COLACE) 100 MG capsule Take 100 mg by mouth daily.   Yes Historical Provider, MD  estradiol (VIVELLE-DOT)  0.075 MG/24HR Place 1 patch onto the skin 2 (two) times a week. On Wednesday and Saturday   Yes Historical Provider, MD  feeding supplement, ENSURE ENLIVE, (ENSURE ENLIVE) LIQD Take 237 mLs by mouth 2 (two) times daily between meals. 11/13/15  Yes Robbie Lis, MD  HYDROcodone-acetaminophen (NORCO/VICODIN) 5-325 MG tablet Take 1 tablet my mouth every 4 hours as needed for pain. 11/13/15  Yes Robbie Lis, MD  ibuprofen (ADVIL,MOTRIN) 200 MG tablet Take 200 mg by mouth every 6 (six) hours as needed.   Yes Historical Provider, MD  lacosamide (VIMPAT) 200 MG TABS tablet Take 1 tablet (200 mg total) by  mouth 2 (two) times daily. 11/15/15  Yes Carmen Dohmeier, MD  levocetirizine (XYZAL) 5 MG tablet Take 5 mg by mouth every evening.    Yes Historical Provider, MD  omeprazole (PRILOSEC) 40 MG capsule Take 1 capsule (40 mg total) by mouth daily. 09/21/15  Yes Butch Penny, NP  oxybutynin (DITROPAN-XL) 5 MG 24 hr tablet Take 5 mg by mouth daily.   Yes Historical Provider, MD  Pediatric Multiple Vit-C-FA (PEDIATRIC MULTIVITAMIN) chewable tablet Chew 1 tablet by mouth daily.     Yes Historical Provider, MD  levETIRAcetam (KEPPRA) 500 MG tablet Take 500 mg by mouth 2 (two) times daily. 11/13/15   Historical Provider, MD    ROS:  Out of a complete 14 system review of symptoms, the patient complains only of the following symptoms, and all other reviewed systems are negative.   appetite change  Seizures  Joint pain , ankle pain  Daytime sleepiness  Blood pressure 106/67, pulse 72.  Physical Exam  General: The patient is alert and cooperative at the time of the examination.  Skin: No significant peripheral edema is noted.   Neurologic Exam  Mental status: The patient is alert and oriented x 3 at the time of the examination. The patient has apparent normal recent and remote memory, with an apparently normal attention span and concentration ability.   Cranial nerves: Facial symmetry is present. Speech is normal, no aphasia or dysarthria is noted. Extraocular movements are full. Visual fields are full.  Motor: The patient has good strength in all 4 extremities.  Sensory examination: Soft touch sensation is symmetric on the face, arms, and legs.  Coordination: The patient has good finger-nose-finger and heel-to-shin bilaterally. Apraxia with the use of the extremities is noted.  Gait and station: The patient has a normal gait. Tandem gait is unsteady. Romberg is negative. No drift is seen.  Reflexes: Deep tendon reflexes are symmetric.   CT head 11/08/15:  IMPRESSION: CT head: Atrophy,  greatest in the cerebellar region. Encephalomalacia in the inferomedial right frontal lobe. No intracranial mass, hemorrhage, extra-axial fluid collection, or acute infarct. There is a right frontal scalp hematoma. Mild chronic mastoid disease bilaterally is stable. There is probable cerumen in the right external auditory canal.  * CT scan images were reviewed online. I agree with the written report.    Assessment/Plan:   1. Intractable seizures   2. Mental retardation   The patient has not had any recurrent seizures , she is doing fairly well on the current drug regimen, but the clonazepam has resulted in some increased drowsiness. For now, we will not alter the dosing. We may need to reduce the clonazepam dosing regimen some in the future if the drowsiness persists. The patient will follow-up in 4 months, sooner if needed.  Jill Alexanders MD 12/25/2015 2:33 PM  [facility] [address] [place], [place] [address]  Phone [phone] Fax [phone]

## 2015-12-25 NOTE — Patient Instructions (Addendum)

## 2016-01-04 ENCOUNTER — Ambulatory Visit: Payer: Self-pay | Admitting: Neurology

## 2016-01-04 DIAGNOSIS — S82841D Displaced bimalleolar fracture of right lower leg, subsequent encounter for closed fracture with routine healing: Secondary | ICD-10-CM | POA: Diagnosis not present

## 2016-01-22 ENCOUNTER — Other Ambulatory Visit: Payer: Self-pay | Admitting: Neurology

## 2016-01-22 DIAGNOSIS — R569 Unspecified convulsions: Secondary | ICD-10-CM

## 2016-01-22 MED ORDER — CLONAZEPAM 0.5 MG PO TABS: 0.5000 mg | ORAL_TABLET | Freq: Two times a day (BID) | ORAL | 2 refills | Status: DC

## 2016-01-22 MED ORDER — DIVALPROEX SODIUM ER 500 MG PO TB24: 500.0000 mg | ORAL_TABLET | Freq: Two times a day (BID) | ORAL | 0 refills | Status: DC

## 2016-01-22 NOTE — Telephone Encounter (Signed)
Duplicate task.

## 2016-01-22 NOTE — Telephone Encounter (Addendum)
Mother Romie Minus called to request refill of divalproex (DEPAKOTE ER) 500 MG 24 hr tablet to [facility] and refill of clonazePAM (KLONOPIN) 0.5 MG tablet to [facility], has enough through Thursday, also states she was advised by pharmacy it's too soon, not due until August 29th, she thinks there was an error in the number given to them because she has been giving as prescribed. Was advised by pharmacy that Dr. Jannifer [place] could call in as early refill.

## 2016-01-22 NOTE — Telephone Encounter (Signed)
Pt's mother, Romie Minus called back in about Klonopin. She is requesting a rx for both Depakote and [facility]opin [facility]. Please call and discuss

## 2016-01-22 NOTE — Telephone Encounter (Signed)
Rx for Klonopin faxed to [facility].

## 2016-01-22 NOTE — Telephone Encounter (Signed)
Depakote escribed to [facility] and Klonopin routed to [facility]

## 2016-01-31 ENCOUNTER — Telehealth: Payer: Self-pay | Admitting: *Deleted

## 2016-01-31 ENCOUNTER — Telehealth: Payer: Self-pay

## 2016-01-31 DIAGNOSIS — N958 Other specified menopausal and perimenopausal disorders: Secondary | ICD-10-CM | POA: Diagnosis not present

## 2016-01-31 DIAGNOSIS — Z6822 Body mass index (BMI) 22.0-22.9, adult: Secondary | ICD-10-CM | POA: Diagnosis not present

## 2016-01-31 DIAGNOSIS — R569 Unspecified convulsions: Secondary | ICD-10-CM

## 2016-01-31 DIAGNOSIS — M8588 Other specified disorders of bone density and structure, other site: Secondary | ICD-10-CM | POA: Diagnosis not present

## 2016-01-31 DIAGNOSIS — Z01419 Encounter for gynecological examination (general) (routine) without abnormal findings: Secondary | ICD-10-CM | POA: Diagnosis not present

## 2016-01-31 MED ORDER — CLONAZEPAM 0.5 MG PO TABS: 0.5000 mg | ORAL_TABLET | Freq: Two times a day (BID) | ORAL | 1 refills | Status: DC

## 2016-01-31 NOTE — Telephone Encounter (Signed)
Pharmacy faxed in request for 90 day supply of Clonazepam 0.5mg .

## 2016-01-31 NOTE — Telephone Encounter (Signed)
The patient recently was given a prescription for clonazepam for 1 month supply with 2 refills, the mail order pharmacy is calling wanting a 90 day supply of this medication. I will call in this prescription.

## 2016-01-31 NOTE — Telephone Encounter (Signed)
Clonazepam rx faxed and confirmed to [facility] in [place] at [phone].

## 2016-02-01 DIAGNOSIS — Z6822 Body mass index (BMI) 22.0-22.9, adult: Secondary | ICD-10-CM | POA: Diagnosis not present

## 2016-02-01 DIAGNOSIS — Z0001 Encounter for general adult medical examination with abnormal findings: Secondary | ICD-10-CM | POA: Diagnosis not present

## 2016-02-20 ENCOUNTER — Telehealth: Payer: Self-pay

## 2016-02-20 DIAGNOSIS — R569 Unspecified convulsions: Secondary | ICD-10-CM

## 2016-02-20 MED ORDER — CLONAZEPAM 0.5 MG PO TABS: 0.5000 mg | ORAL_TABLET | Freq: Two times a day (BID) | ORAL | 1 refills | Status: DC

## 2016-02-20 NOTE — Telephone Encounter (Signed)
90 refill request from [facility] received for Clonazepam 0.5mg .

## 2016-02-20 NOTE — Telephone Encounter (Signed)
I will refill the clonazepam

## 2016-02-21 NOTE — Telephone Encounter (Signed)
Rx printed, signed, faxed to [facility] mail order pharmacy.

## 2016-02-28 ENCOUNTER — Other Ambulatory Visit (HOSPITAL_COMMUNITY): Payer: Medicare Other

## 2016-02-28 ENCOUNTER — Ambulatory Visit (HOSPITAL_COMMUNITY): Payer: Medicare Other | Admitting: Oncology

## 2016-04-04 DIAGNOSIS — Z23 Encounter for immunization: Secondary | ICD-10-CM | POA: Diagnosis not present

## 2016-04-09 ENCOUNTER — Encounter (HOSPITAL_COMMUNITY): Payer: Medicare Other | Attending: Oncology | Admitting: Oncology

## 2016-04-09 ENCOUNTER — Encounter (HOSPITAL_COMMUNITY): Payer: Medicare Other

## 2016-04-09 ENCOUNTER — Encounter (HOSPITAL_COMMUNITY): Payer: Self-pay | Admitting: Oncology

## 2016-04-09 DIAGNOSIS — D696 Thrombocytopenia, unspecified: Secondary | ICD-10-CM | POA: Diagnosis not present

## 2016-04-09 LAB — CBC WITH DIFFERENTIAL/PLATELET
Basophils Absolute: 0 10*3/uL (ref 0.0–0.1)
Basophils Relative: 0 %
EOS ABS: 0.2 10*3/uL (ref 0.0–0.7)
EOS PCT: 2 %
HCT: 37.6 % (ref 36.0–46.0)
HEMOGLOBIN: 12.4 g/dL (ref 12.0–15.0)
LYMPHS ABS: 2.6 10*3/uL (ref 0.7–4.0)
Lymphocytes Relative: 37 %
MCH: 31.6 pg (ref 26.0–34.0)
MCHC: 33 g/dL (ref 30.0–36.0)
MCV: 95.9 fL (ref 78.0–100.0)
MONOS PCT: 8 %
Monocytes Absolute: 0.5 10*3/uL (ref 0.1–1.0)
Neutro Abs: 3.6 10*3/uL (ref 1.7–7.7)
Neutrophils Relative %: 53 %
Platelets: 133 10*3/uL — ABNORMAL LOW (ref 150–400)
RBC: 3.92 MIL/uL (ref 3.87–5.11)
RDW: 14 % (ref 11.5–15.5)
WBC: 6.9 10*3/uL (ref 4.0–10.5)

## 2016-04-09 NOTE — Patient Instructions (Addendum)
[place] at [facility] Discharge Instructions  RECOMMENDATIONS MADE BY THE CONSULTANT AND ANY TEST RESULTS WILL BE SENT TO YOUR REFERRING PHYSICIAN.  You were seen today by Kirby Crigler PA-C. Return for labs in 6 months. Return for labs and a follow up in 12 months.    Thank you for choosing [place] at [facility] to provide your oncology and hematology care.  To afford each patient quality time with our provider, please arrive at least 15 minutes before your scheduled appointment time.   Beginning January 23rd 2017 lab work for the [facility] will be done in the  Main lab at [facility] on 1st floor. If you have a lab appointment with the [facility] please come in thru the  Main Entrance and check in at the main information desk  You need to re-schedule your appointment should you arrive 10 or more minutes late.  We strive to give you quality time with our providers, and arriving late affects you and other patients whose appointments are after yours.  Also, if you no show three or more times for appointments you may be dismissed from the clinic at the providers discretion.     Again, thank you for choosing [facility].  Our hope is that these requests will decrease the amount of time that you wait before being seen by our physicians.       _____________________________________________________________  Should you have questions after your visit to [facility], please contact our office at [phone] between the hours of 8:30 a.m. and 4:30 p.m.  Voicemails left after 4:30 p.m. will not be returned until the following business day.  For prescription refill requests, have your pharmacy contact our office.         Resources For Cancer Patients and their Caregivers ? American Cancer Society: Can assist with transportation, wigs, general needs, runs Look Good Feel Better.        309-347-5034 ? Cancer  Care: Provides financial assistance, online support groups, medication/co-pay assistance.  1-800-813-HOPE 986-299-4955) ? [place] Assists [place] cancer patients and their families through emotional , educational and financial support.  (925) 021-9456 ? [place] DSS Where to apply for food stamps, Medicaid and utility assistance. 302-216-5678 ? [facility]: Transportation to medical appointments. 718-030-0053 ? Social Security Administration: May apply for disability if have a Stage IV cancer. 760-719-1109 732 346 6980 ? [employer], Disability and Transit Services: Assists with nutrition, care and transit needs. [place] Support Programs: @10RELATIVEDAYS @ > Cancer Support Group  2nd Tuesday of the month 1pm-2pm, Journey Room  > Creative Journey  3rd Tuesday of the month 1130am-1pm, Journey Room  > Look Good Feel Better  1st Wednesday of the month 10am-12 noon, Journey Room (Call [place] to register [phone])

## 2016-04-09 NOTE — Progress Notes (Signed)
[facility] [address] [place] [address]  Thrombocytopenia [facility]) - Plan: CBC with Differential  CURRENT THERAPY: Observation  INTERVAL HISTORY: Maria [name] 55 y.o. female returns for followup of  thrombocytopenia consistent with low-grade idiopathic thrombocytopenic purpura with low-medium IgG anticardiolipin antibody and negative platelet antibody testing versus medication-induced with Depakote for seizure disorder.  She was hospitalized from 6/8- 11/13/2015 for myoclonus and acute encephalopathy.  She denies any rashes or bleeding.    Review of Systems  Constitutional: Negative.  Negative for chills, fever and weight loss.  HENT: Negative.  Negative for nosebleeds.   Eyes: Negative.   Respiratory: Negative.  Negative for cough, hemoptysis and sputum production.   Cardiovascular: Negative.   Gastrointestinal: Negative.  Negative for blood in stool and melena.  Genitourinary: Negative.  Negative for hematuria.  Musculoskeletal: Negative.   Skin: Negative.  Negative for rash.  Neurological: Positive for seizures (H/O.  None recently). Negative for weakness.  Endo/Heme/Allergies: Negative.  Does not bruise/bleed easily.  Psychiatric/Behavioral: Negative.     Past Medical History:  Diagnosis Date  . Constipation   . Dysphagia   . Fracture    R foot  . GERD (gastroesophageal reflux disease)   . Mental retardation    lesion in head  . Seizures ([place])    "not fully controlled on max doses of meds" (Neuro ofc note 12/2014)  . Thrombocytopenia ([place])   . Tremor     Past Surgical History:  Procedure Laterality Date  . BIOPSY  09/16/2014   Procedure: BIOPSY;  Surgeon: Rogene Houston, MD;  Location: [facility];  Service: Endoscopy;;  . CATARACT EXTRACTION     both eyes, May of 2015  . ESOPHAGEAL DILATION N/A 09/16/2014   Procedure: ESOPHAGEAL DILATION WITH 54FR MALONEY DILATOR;  Surgeon: Rogene Houston, MD;  Location: [facility];   Service: Endoscopy;  Laterality: N/A;  . ESOPHAGOGASTRODUODENOSCOPY (EGD) WITH PROPOFOL N/A 09/16/2014   Procedure: ESOPHAGOGASTRODUODENOSCOPY (EGD) WITH PROPOFOL;  Surgeon: Rogene Houston, MD;  Location: [facility];  Service: Endoscopy;  Laterality: N/A;  . MOUTH SURGERY    . Skin graft to gum Right 08/2013  . TONSILLECTOMY AND ADENOIDECTOMY    . TOTAL ABDOMINAL HYSTERECTOMY      Family History  Problem Relation Age of Onset  . High Cholesterol Mother   . High blood pressure Mother   . Diabetes Father    Social history:  reports that she quit smoking about 9 years ago. Her smoking use included Cigarettes. She has a 22.50 pack-year smoking history. She has never used smokeless tobacco. She reports that she does not drink alcohol or use drugs.  PHYSICAL EXAMINATION  ECOG PERFORMANCE STATUS: 1 - Symptomatic but completely ambulatory  Vitals:   04/09/16 1500  BP: (!) 115/97  Pulse: 60  Resp: 16  Temp: 98.1 F (36.7 C)    GENERAL:alert, no distress, well nourished, well developed, comfortable, cooperative, smiling and accompanied by elderly mother, blunted/flattened affect. SKIN: skin color, texture, turgor are normal, no rashes or significant lesions HEAD: Normocephalic, No masses, lesions, tenderness or abnormalities EYES: normal, EOMI, Conjunctiva are pink and non-injected EARS: External ears normal OROPHARYNX:lips, buccal mucosa, and tongue normal and mucous membranes are moist  NECK: supple, no adenopathy, trachea midline LYMPH:  no palpable lymphadenopathy BREAST:not examined LUNGS: clear to auscultation  HEART: regular rate & rhythm ABDOMEN:abdomen soft and normal bowel sounds BACK: Back symmetric, no curvature. EXTREMITIES:less then 2 second  capillary refill, no joint deformities, effusion, or inflammation, no skin discoloration, no cyanosis  NEURO: alert & oriented x 3 with fluent speech, no focal motor/sensory deficits, gait normal   LABORATORY DATA: CBC      Component Value Date/Time   WBC 6.9 04/09/2016 1409   RBC 3.92 04/09/2016 1409   HGB 12.4 04/09/2016 1409   HCT 37.6 04/09/2016 1409   HCT 38.8 09/14/2015 1407   PLT 133 (L) 04/09/2016 1409   PLT 120 (L) 09/14/2015 1407   MCV 95.9 04/09/2016 1409   MCV 93 09/14/2015 1407   MCH 31.6 04/09/2016 1409   MCHC 33.0 04/09/2016 1409   RDW 14.0 04/09/2016 1409   RDW 15.2 09/14/2015 1407   LYMPHSABS 2.6 04/09/2016 1409   LYMPHSABS 2.5 09/14/2015 1407   MONOABS 0.5 04/09/2016 1409   EOSABS 0.2 04/09/2016 1409   EOSABS 0.1 09/14/2015 1407   BASOSABS 0.0 04/09/2016 1409   BASOSABS 0.0 09/14/2015 1407      Chemistry      Component Value Date/Time   NA 138 11/09/2015 1727   NA 138 09/14/2015 1407   K 3.9 11/09/2015 1727   CL 105 11/09/2015 1727   CO2 27 11/09/2015 1727   BUN 17 11/09/2015 1727   BUN 20 09/14/2015 1407   CREATININE 0.84 11/09/2015 1727      Component Value Date/Time   CALCIUM 9.8 11/09/2015 1727   ALKPHOS 53 09/14/2015 1407   AST 13 09/14/2015 1407   ALT 14 09/14/2015 1407   BILITOT 0.3 09/14/2015 1407        PENDING LABS:   RADIOGRAPHIC STUDIES:  No results found.   PATHOLOGY:    ASSESSMENT AND PLAN:  Thrombocytopenia Stable x many years, thus far consistent with low-grade idiopathic thrombocytopenic purpura in the setting of low-medium IgG anticardiolipin antibody and negative platelet antibodies versus medication-induced with Depakote for seizure disorder.  Labs today: CBC diff. I personally reviewed and went over laboratory results with the patient.  The results are noted within this dictation.  Labs have been very stable over the past 12 months with episodes of low-normal platelet count.  Labs in 6 and 12 months: CBC diff.    Return in 12 months for follow-up.  We can always cancel lab appointment is recent labs are performed with a stable platelet count.   ORDERS PLACED FOR THIS ENCOUNTER: Orders Placed This Encounter  Procedures  . CBC  with Differential    MEDICATIONS PRESCRIBED THIS ENCOUNTER: No orders of the defined types were placed in this encounter.   THERAPY PLAN:  Continue ongoing observation.  All questions were answered. The patient knows to call the clinic with any problems, questions or concerns. We can certainly see the patient much sooner if necessary.  Patient and plan discussed with Dr. Ancil Linsey and she is in agreement with the aforementioned.   This note is electronically signed by: Doy Mince 04/09/2016 3:11 PM

## 2016-04-09 NOTE — Assessment & Plan Note (Addendum)
Stable x many years, thus far consistent with low-grade idiopathic thrombocytopenic purpura in the setting of low-medium IgG anticardiolipin antibody and negative platelet antibodies versus medication-induced with Depakote for seizure disorder.  Labs today: CBC diff. I personally reviewed and went over laboratory results with the patient.  The results are noted within this dictation.  Labs have been very stable over the past 12 months with episodes of low-normal platelet count.  Labs in 6 and 12 months: CBC diff.    Return in 12 months for follow-up.  We can always cancel lab appointment is recent labs are performed with a stable platelet count.

## 2016-04-11 IMAGING — CR DG THORACIC SPINE 2V
3 series · 3 of 3 positions shown · non-contrast
Comparison: Chest x-ray on 03/16/2014

CLINICAL DATA: Seizure, fall and back pain.  Initial encounter.

EXAM:
THORACIC SPINE - 2 VIEW

[view not recorded (1 of 3)]
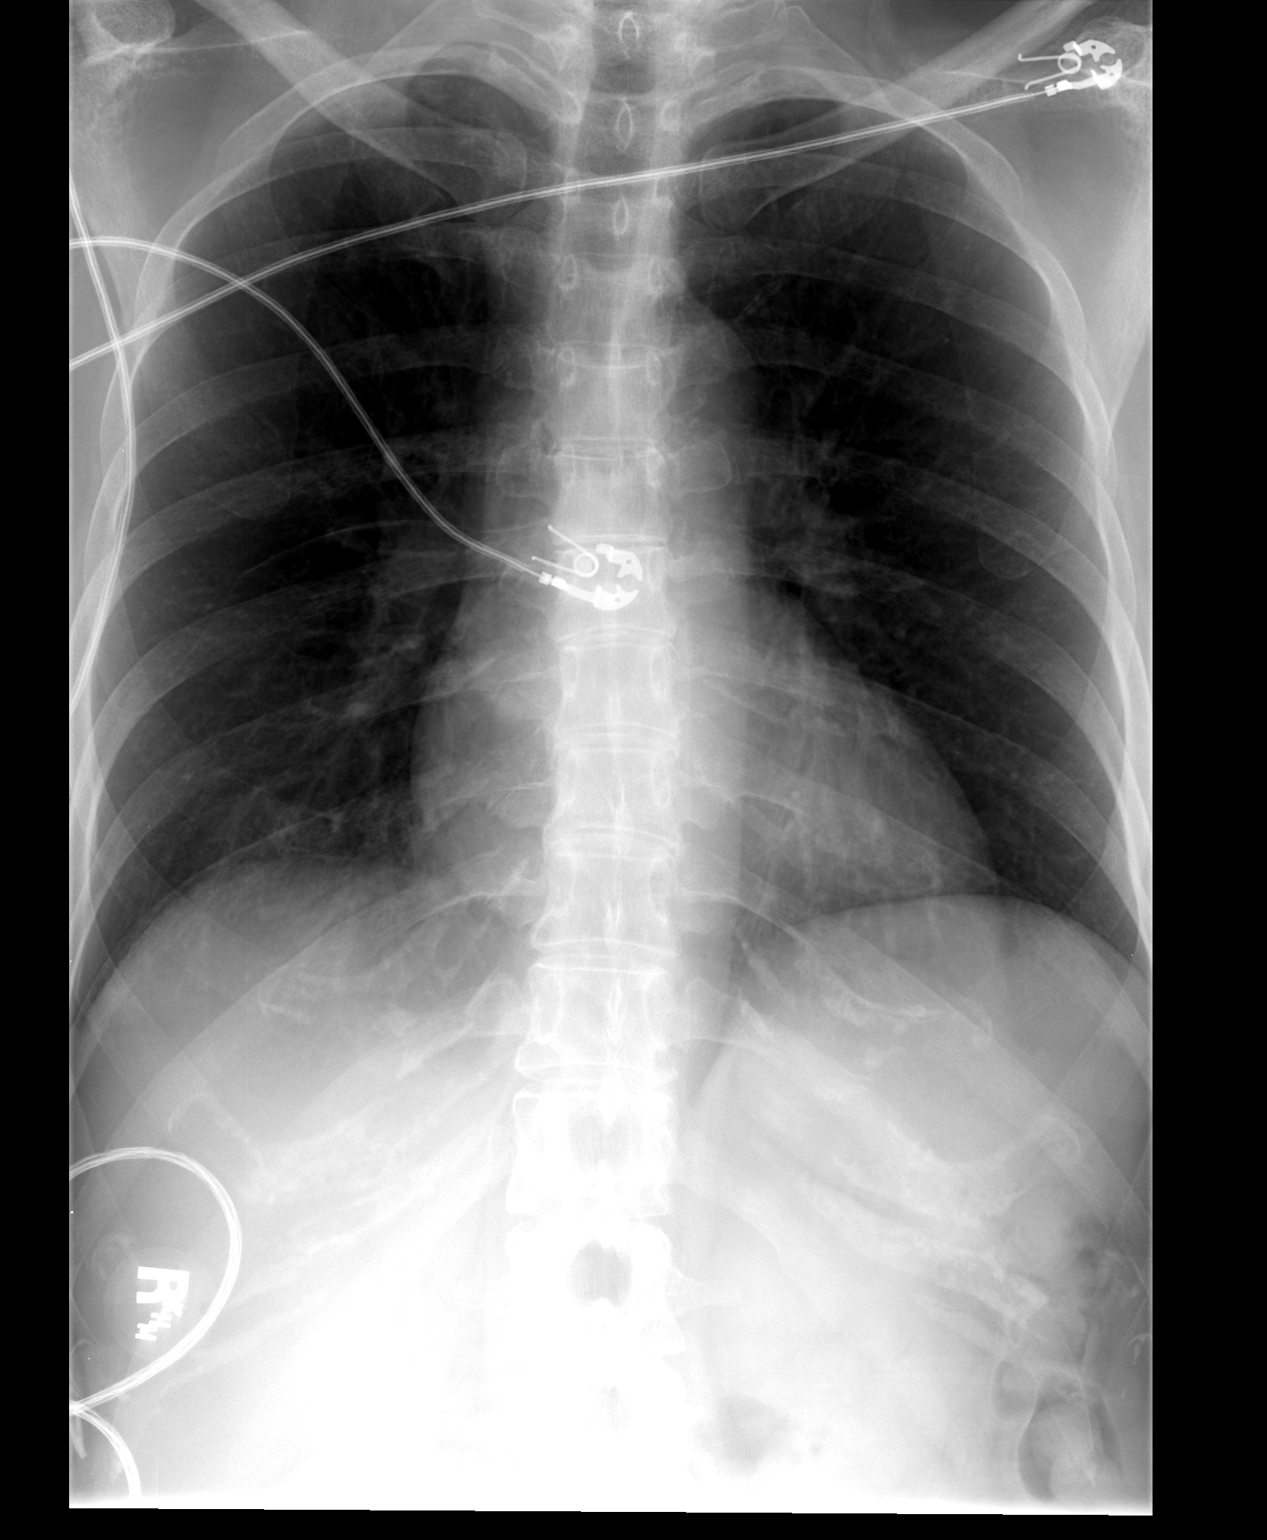

[view not recorded (2 of 3)]
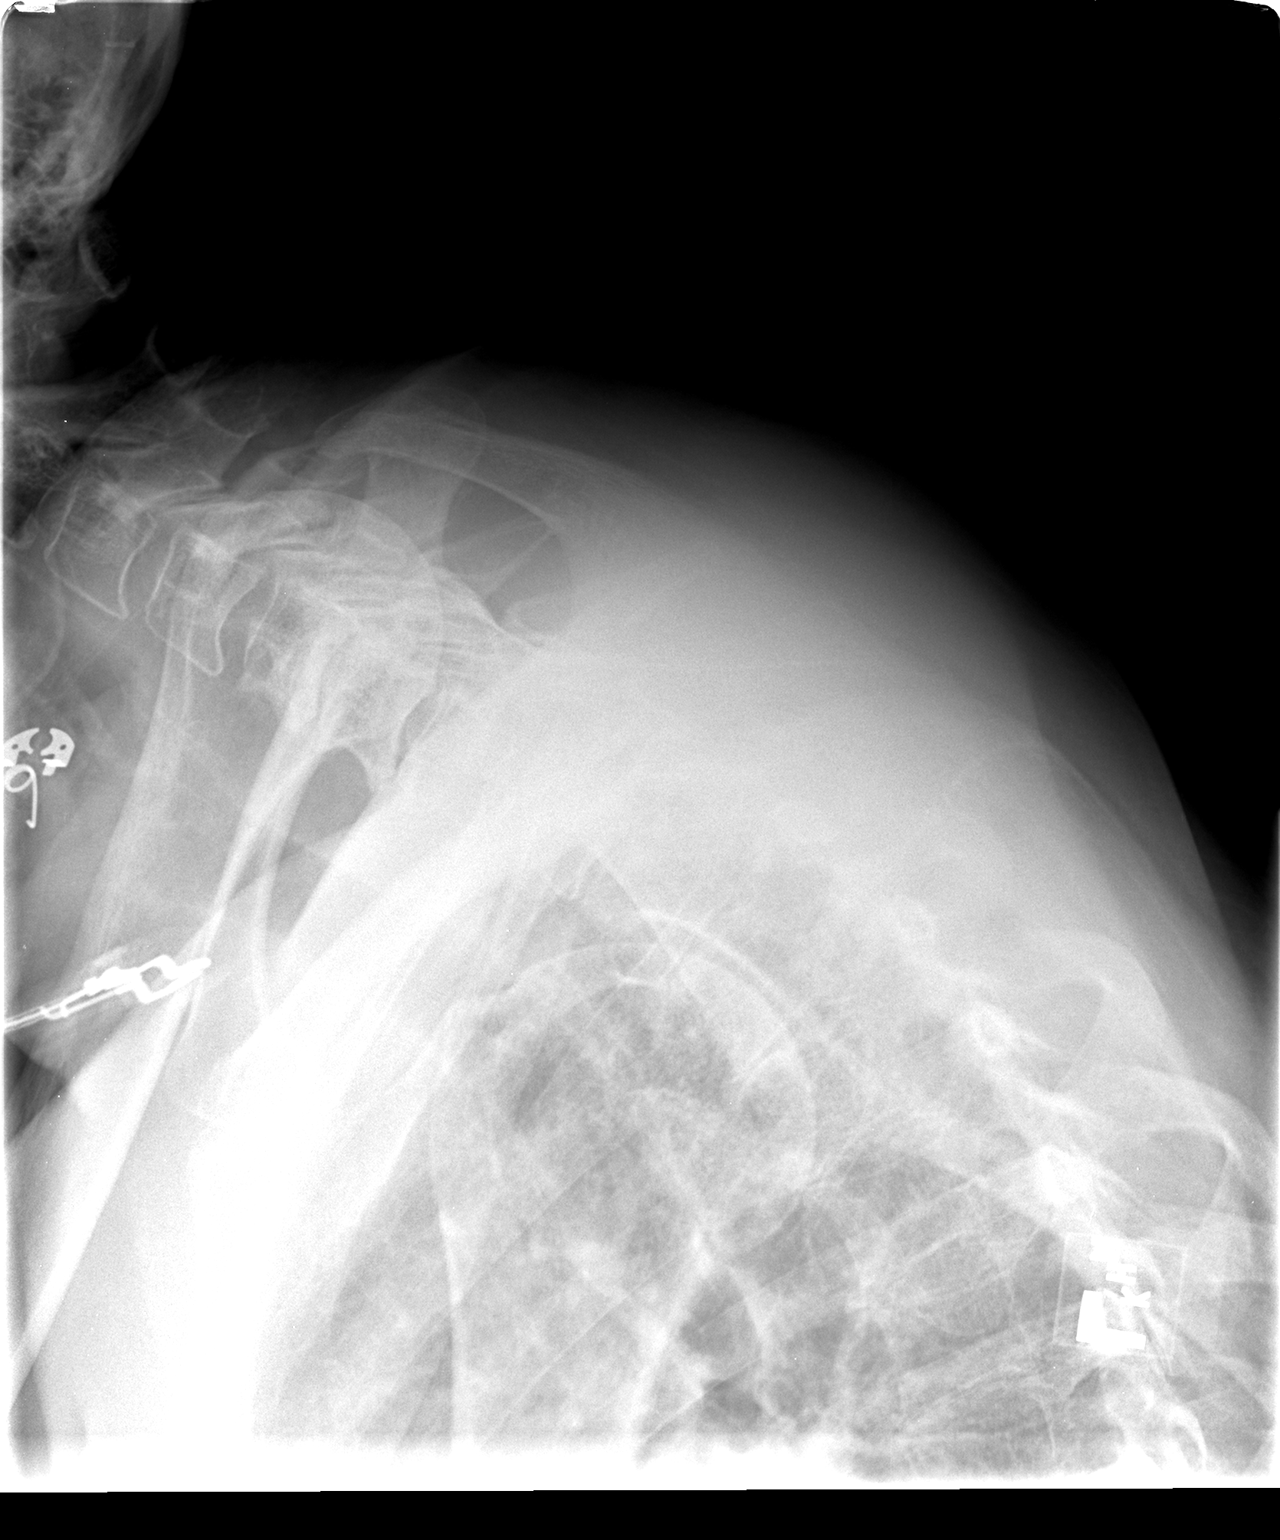

[view not recorded (3 of 3)]
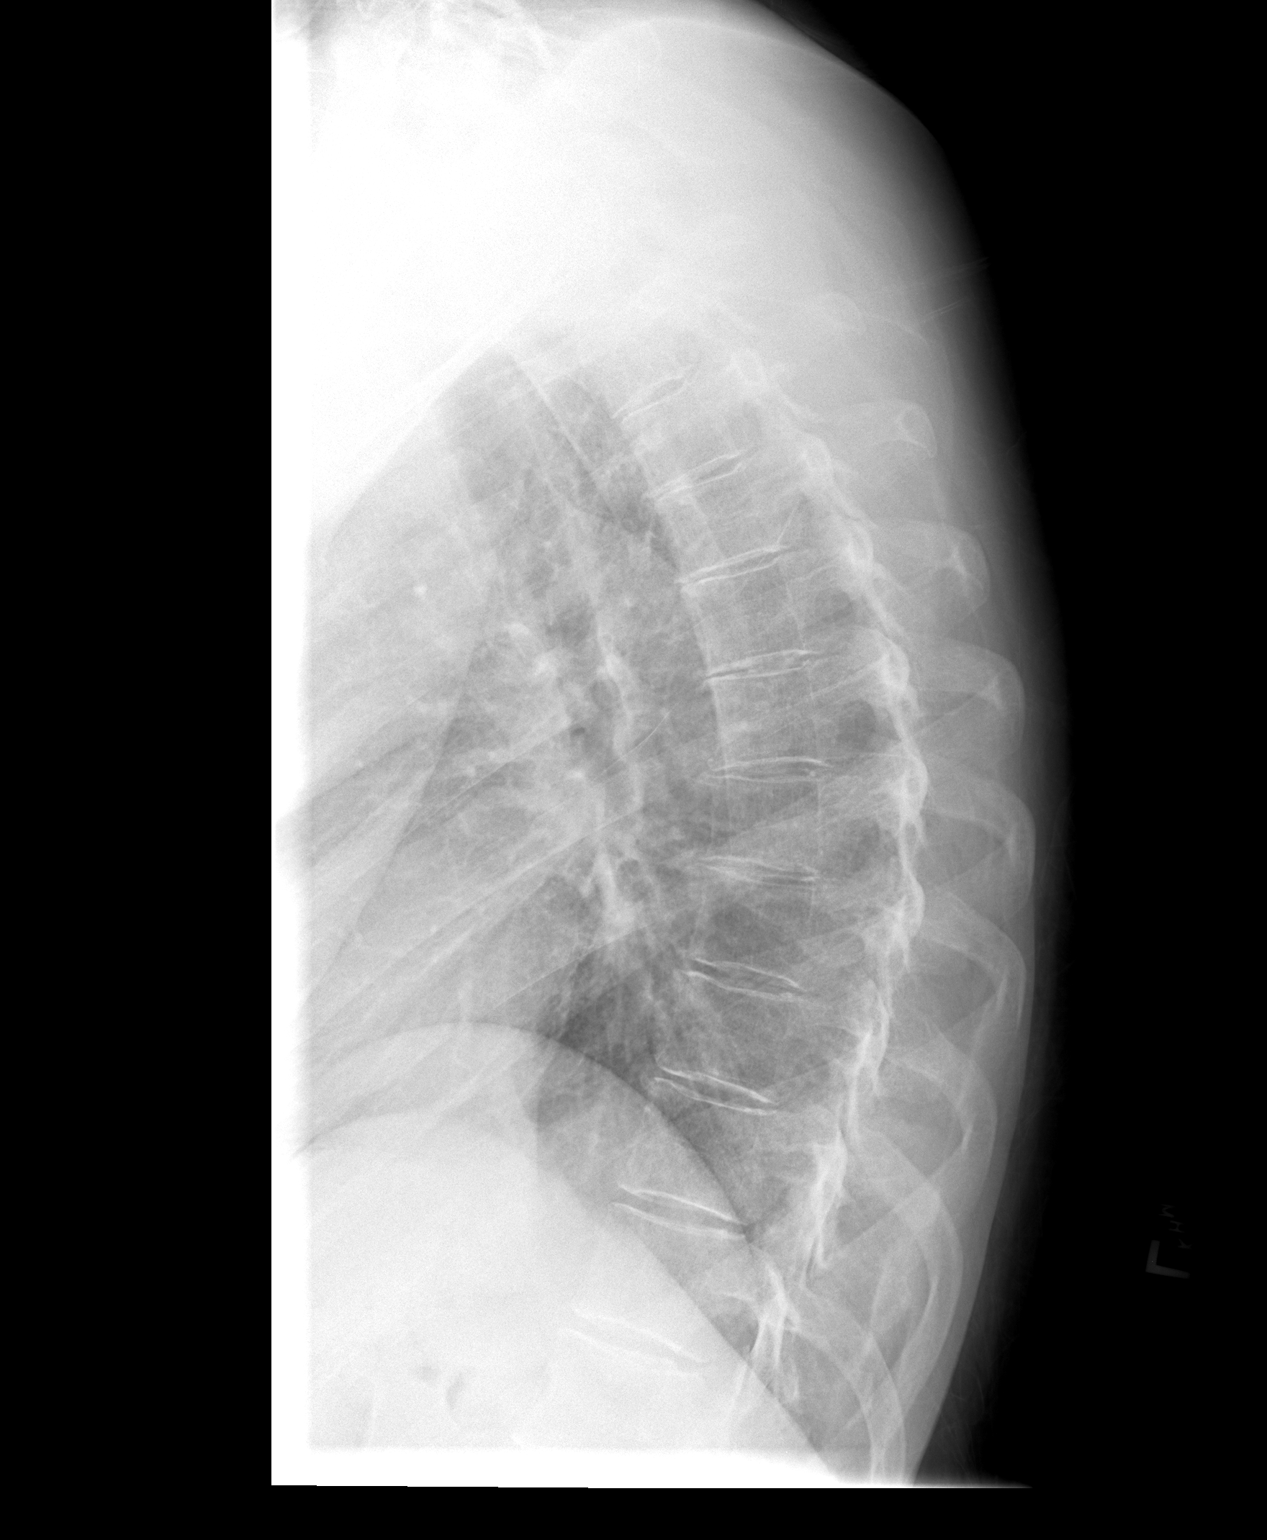

[3 of 3 positions shown; findings below may reference images not displayed]

FINDINGS: No fracture or subluxation is identified. Very minimal degenerative
spondylosis seen at multiple levels throughout the thoracic spine.
No bony lesions or destruction identified.
IMPRESSION: No evidence of thoracic fracture.

## 2016-04-11 IMAGING — CT CT CERVICAL SPINE W/O CM
3 of 5 series · 12 of 33 positions shown, 14 images · non-contrast
Comparison: Head CT 03/16/2014.

CLINICAL DATA: Seizure.  Fell.  Hit head.

EXAM:
CT HEAD WITHOUT CONTRAST
CT CERVICAL SPINE WITHOUT CONTRAST
TECHNIQUE: Multidetector CT imaging of the head and cervical spine was
performed following the standard protocol without intravenous
contrast. Multiplanar CT image reconstructions of the cervical spine
were also generated.

[Series 5: cervical st 2.0 b31s · axial · 0.27mm/px · z∈[+70,+162]mm · 4 of 78 slices shown, 5 images]
[im 16/78  soft-tissue]
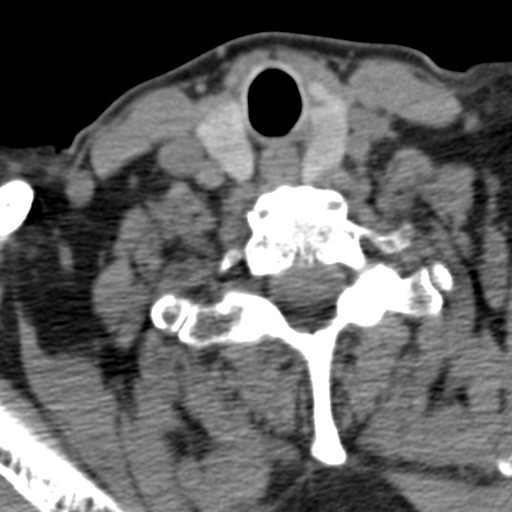
[im 16/78  bone]
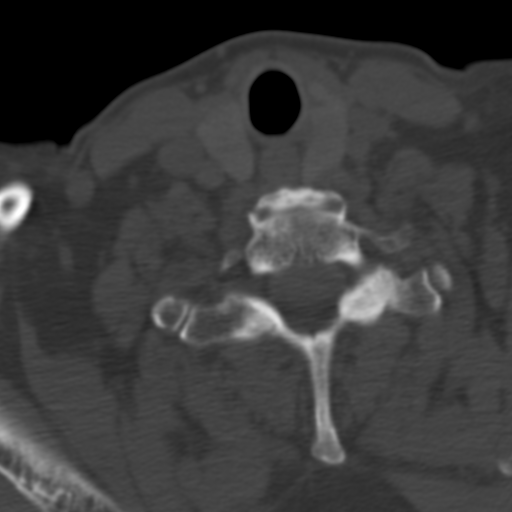
[im 31/78  bone]
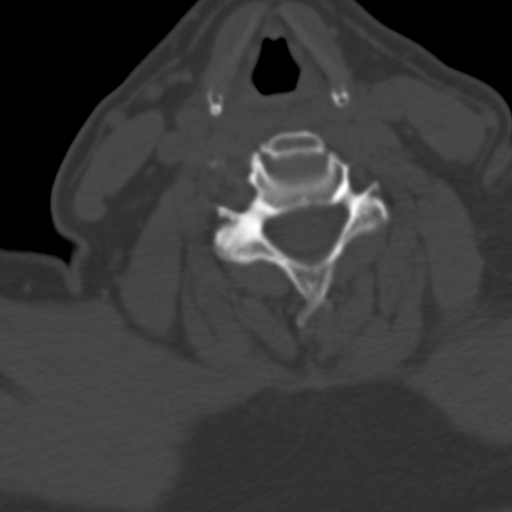
[im 47/78  bone]
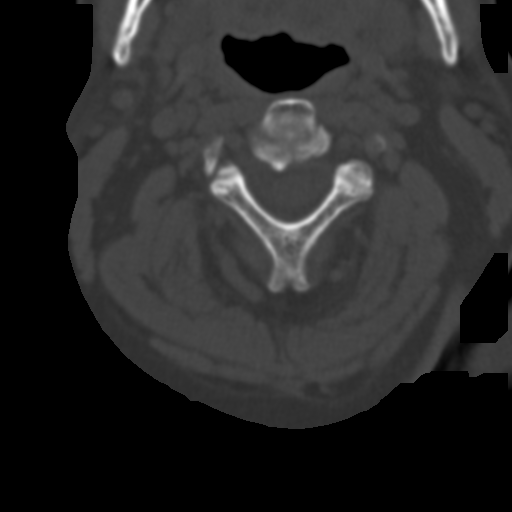
[im 62/78  bone]
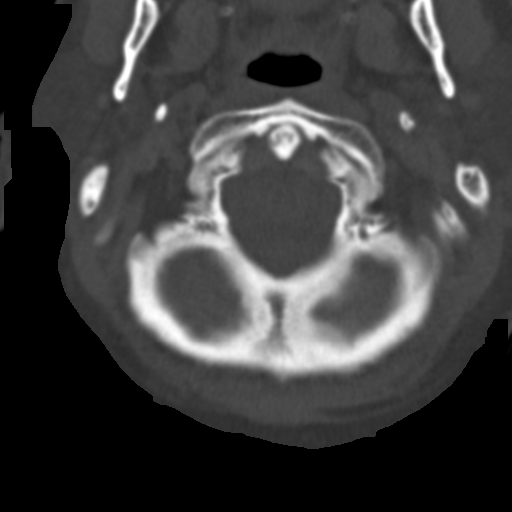

[Series 7: sagittal bone 2.0 · sagittal · 0.21mm/px · 5 of 41 slices shown, 6 images]
[im 14/41  bone]
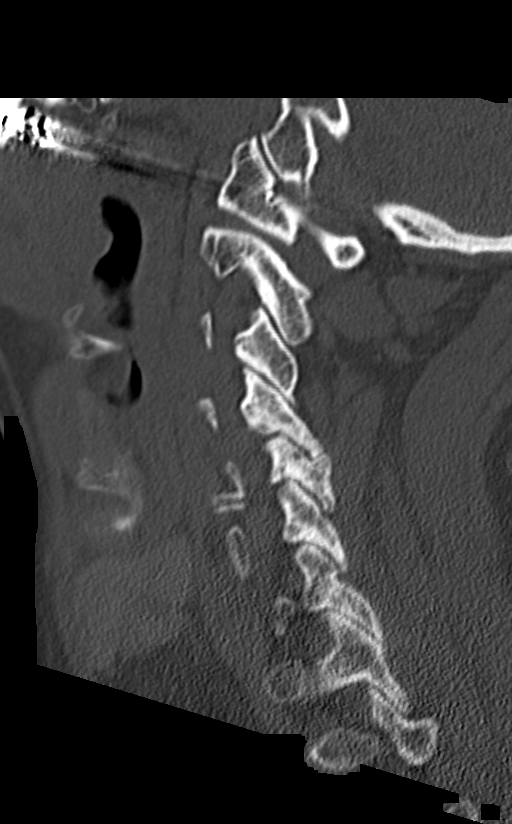
[im 17/41  bone]
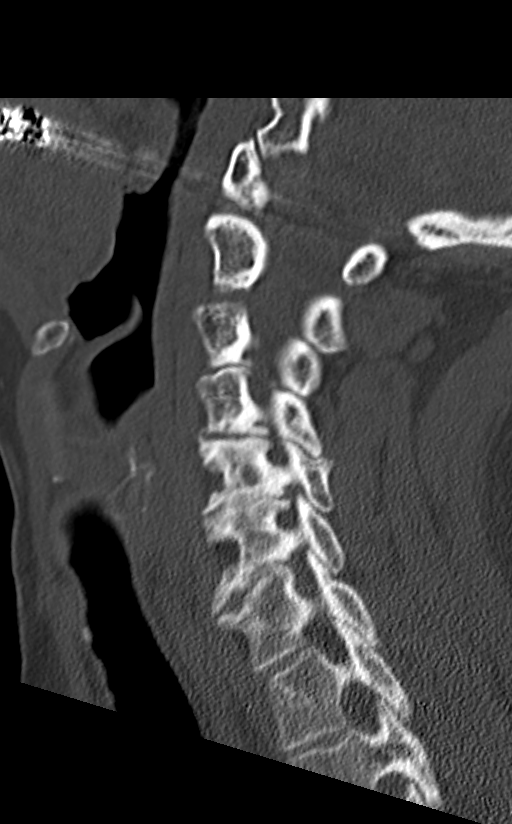
[im 21/41  soft-tissue]
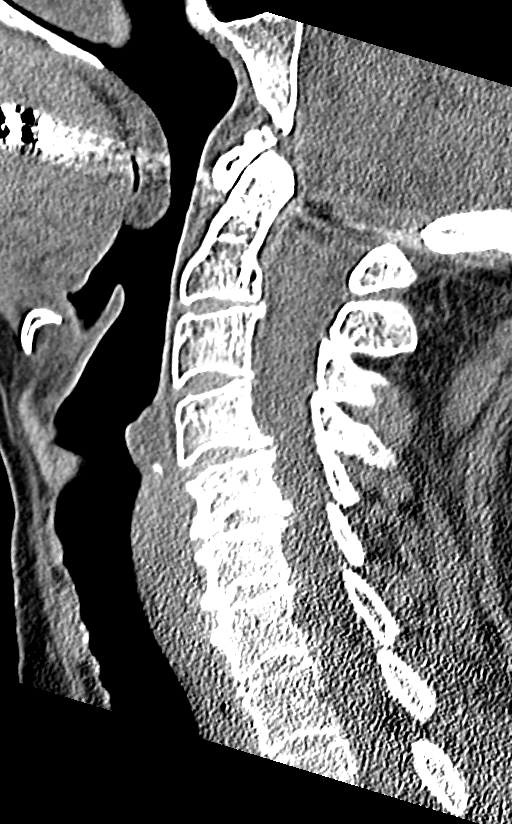
[im 21/41  bone]
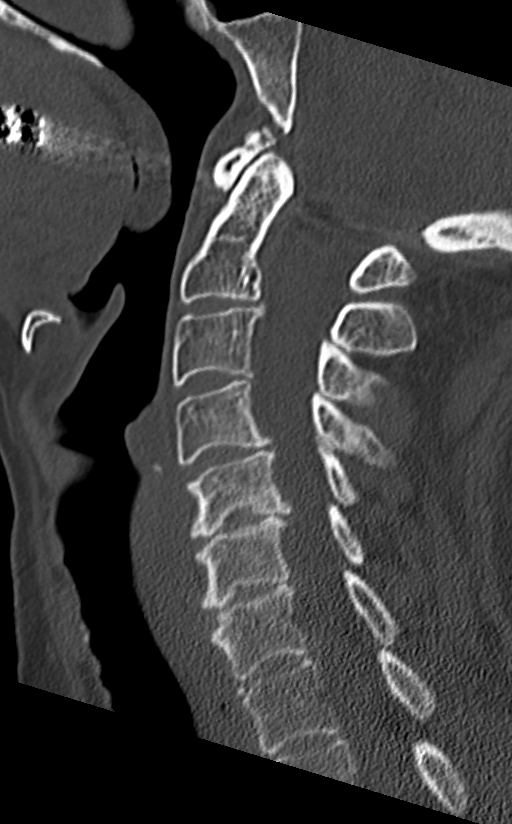
[im 24/41  bone]
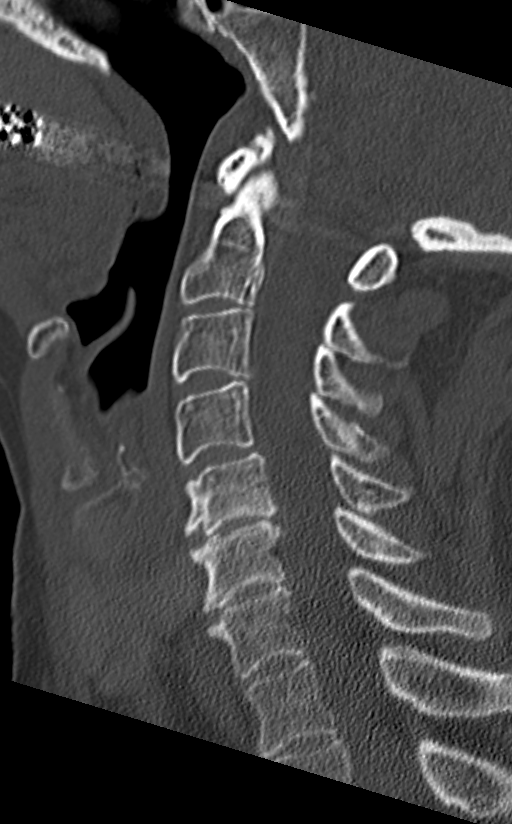
[im 27/41  bone]
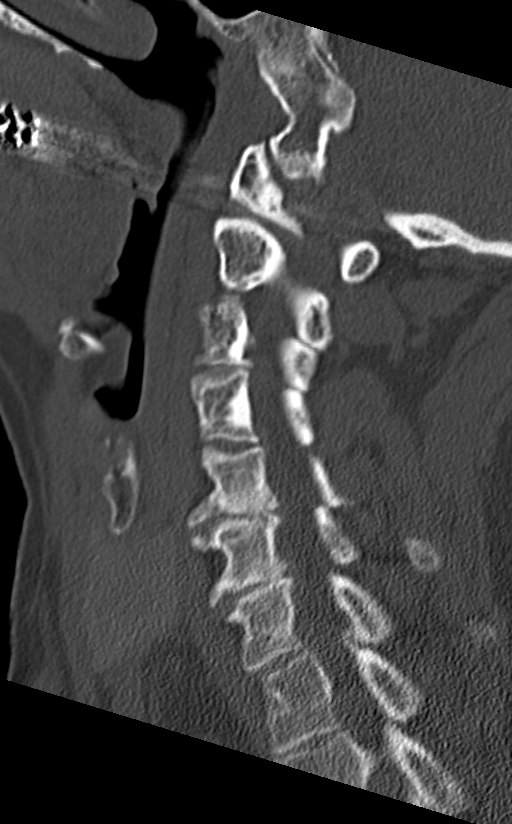

[Series 8: coronal bone 2.0 · coronal · 0.21mm/px · 3 of 51 slices shown]
[im 11/51  bone]
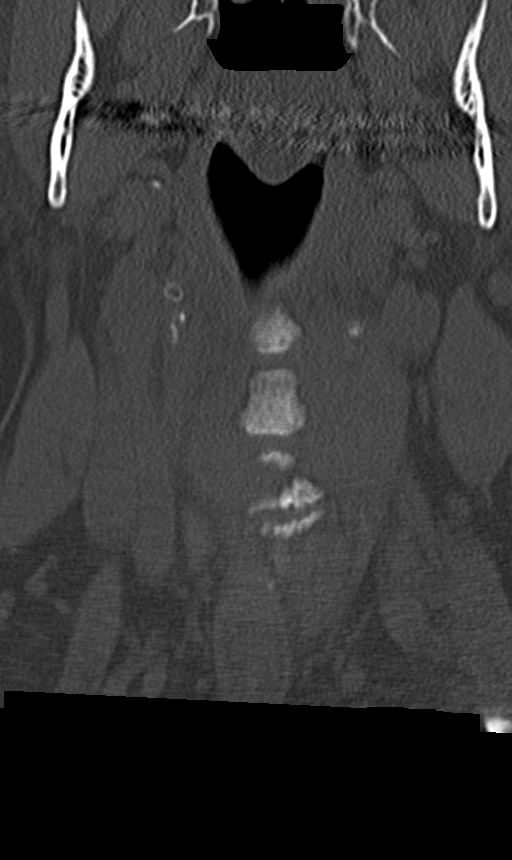
[im 21/51  bone]
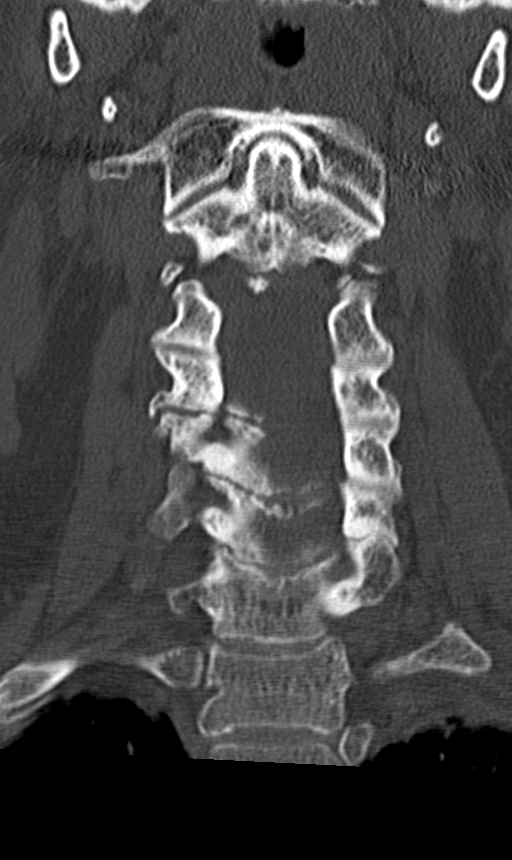
[im 31/51  bone]
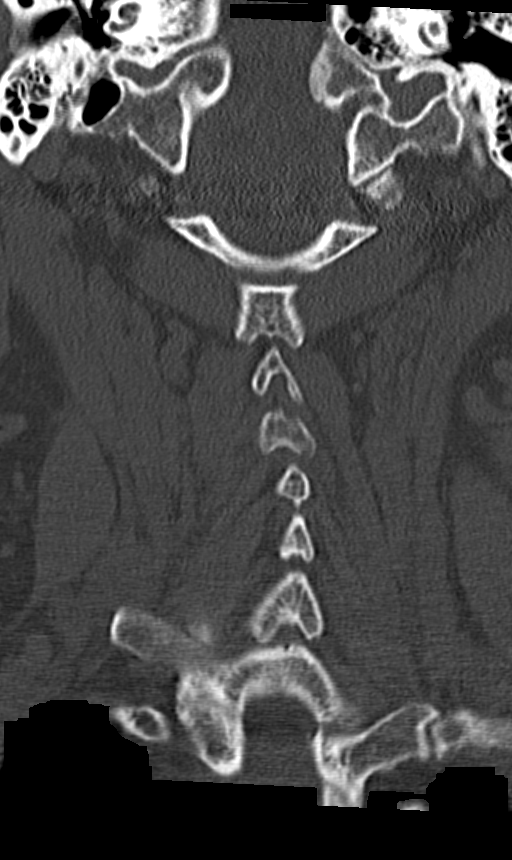

[12 of 33 positions shown; findings below may reference images not displayed]

FINDINGS: CT HEAD FINDINGS

Stable appearance of the brain when compared to prior studies.
Remote area of encephalomalacia in the right frontal lobe likely due
to previous trauma. No extra-axial fluid collections are identified.
No hemispheric infarction an or intracranial hemorrhage. No mass
lesion. Stable mild to moderate cerebellar atrophy.

The bony structures are intact. Hyperostosis frontalis interna
noted. Stable lesion in the right frontal bone has the appearance of
fibrous dysplasia.

CT CERVICAL SPINE FINDINGS

Degenerative cervical spondylosis with multilevel disc disease and
facet disease. Multilevel foraminal stenosis due to uncinate
spurring and facet disease. No acute bony findings. No abnormal
prevertebral soft tissue swelling. The lung apices are grossly
clear.
IMPRESSION: No acute intracranial findings or mass lesions. Stable cerebellar
atrophy.

Degenerative cervical spondylosis but no acute cervical spine
fracture.

## 2016-04-12 ENCOUNTER — Ambulatory Visit (INDEPENDENT_AMBULATORY_CARE_PROVIDER_SITE_OTHER): Payer: Medicare Other

## 2016-04-12 ENCOUNTER — Ambulatory Visit (INDEPENDENT_AMBULATORY_CARE_PROVIDER_SITE_OTHER): Payer: Medicare Other | Admitting: Orthopaedic Surgery

## 2016-04-12 ENCOUNTER — Encounter (INDEPENDENT_AMBULATORY_CARE_PROVIDER_SITE_OTHER): Payer: Self-pay | Admitting: Orthopaedic Surgery

## 2016-04-12 VITALS — BP 112/66 | HR 75

## 2016-04-12 DIAGNOSIS — M79671 Pain in right foot: Secondary | ICD-10-CM | POA: Diagnosis not present

## 2016-04-12 NOTE — Progress Notes (Signed)
Office Visit Note   Patient: Maria [name]           Date of Birth: Sep 20, 1960           MRN: [id number] Visit Date: 04/12/2016              Requested by: Mena [id number] [address] [name], [place] [address] PCP: [facility]   Assessment & Plan: Visit Diagnoses:  1. Pain in right foot     Plan: We'll continue observation with her mother can try some Aspercreme or mineralized. Occasional anti-inflammatories.  Follow-Up Instructions: Return if symptoms worsen or fail to improve. Patient has continued pain that options with the bone scan versus MRI were foot to rule out fracture not recognize some plain radiograph.  Orders:  Orders Placed This Encounter  Procedures  . XR Foot Complete Right   No orders of the defined types were placed in this encounter.     Procedures: No procedures performed   Clinical Data: No additional findings.   Subjective: Chief Complaint  Patient presents with  . Right Foot - Fracture    Ms. Maria [name] was worked in today for right foot pain.  Her mother states that she fell within the last couple of days, they cannot remember exactly when she had the fall. She does have some bruising on the lateral side of the top of the foot.  Painful to walk on the foot or put pressure on it.    Patient lives with her mother and has mental retardation she likes to color and watch: Girls on TV during the day. She had the previous ankle fracture after fall and was treated with a cast has had satisfactory healing. No tenderness around the ankle but she's been complaining off and on of pain over the dorsum of her foot she has some trouble localizing. At times her mother says she has not had pain in her foot.  Review of Systems  Constitutional: Negative for chills and diaphoresis.       Decreased mental acuity she is a pleasant with history of mental retardation her favorite activities IS coloring BOOKS  HENT:  Negative for ear discharge, ear pain and nosebleeds.   Eyes: Negative for discharge and visual disturbance.  Respiratory: Negative for cough, choking and shortness of breath.   Cardiovascular: Negative for chest pain and palpitations.  Gastrointestinal: Negative for abdominal distention and abdominal pain.  Endocrine: Negative for cold intolerance and heat intolerance.  Genitourinary: Negative for flank pain and hematuria.  Musculoskeletal:       History right ankle fracture treated conservatively with casting with complete healing summer 2017  Skin: Negative for rash and wound.  Neurological: Negative for seizures and speech difficulty.  Hematological: Negative for adenopathy. Does not bruise/bleed easily.  Psychiatric/Behavioral: Negative for agitation and suicidal ideas.     Objective: Vital Signs: BP 112/66 (BP Location: Left Arm, Patient Position: Sitting)   Pulse 75   Physical Exam  Constitutional: She is oriented to person, place, and time. She appears well-developed.  HENT:  Head: Normocephalic.  Right Ear: External ear normal.  Left Ear: External ear normal.  Eyes: Pupils are equal, round, and reactive to light.  Neck: No tracheal deviation present. No thyromegaly present.  Cardiovascular: Normal rate.   Pulmonary/Chest: Effort normal.  Abdominal: Soft.  Musculoskeletal:  SEE ORTHO EXAM  Neurological: She is alert and oriented to person, place, and time.  Skin: Skin is warm and dry.  Psychiatric: She has a normal mood and affect. Her behavior is normal.    Ortho Exam patient's able ambulate in the exam room without limping. There is mild dorsal foot swelling noted. Palpable midfoot spur present on the dorsum degenerative in nature and unchanged from 6 months ago. She has slight tenderness in the forefoot region no specific tenderness over the inter digital nerves with palpation of the common digital nerve. Knee exam is normal good hip range of motion. Distal pulses are  2+ no plantar foot lesions. Minimal callus formation over medial bunion nontender. Toe flexion extension is intact. Good capillary refill. Opposite foot shows no swelling. Negative Homans anterior tib EHL is intact negative popliteal compression test. Upper extremities show normal shoulder range of motion good strength. Wrist hand show no arthritis with the absence of spurring good motion good grip. Wrist pulses are normal. No supraclavicular lymphadenopathy. Good cervical range of motion. Negative hip logroll test. Careful palpation over each metatarsal does not reveal any areas of tenderness. Metatarsal phalangeal joint and palpation and motion of each toe was checked and these are normal and do not cause pain. There is mild dorsal forefoot swelling which is unchanged from December when she had the ankle fracture.  Specialty Comments:  No specialty comments available.  Imaging: Xr Foot Complete Right  Result Date: 04/12/2016 Three-view x-rays right foot are obtained. Normal bone architecture no acute fracture. Remodeling of the malleoli are noted on lateral x-ray from previous fracture. Dorsal midfoot degenerative changes unchanged from previous x-ray Impression: No acute fracture. Slight dorsal soft tissue swelling.    PMFS History: Patient Active Problem List   Diagnosis Date Noted  . Pain in right foot 04/12/2016  . Acute encephalopathy   . Myoclonus   . Frequent falls 11/09/2015  . Fall 11/09/2015  . Chest pain 03/16/2014  . Upper abdominal pain 03/16/2014  . Seizure disorder ([place]) 03/16/2014  . 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 Generalized convulsive epilepsy ([place]) 10/06/2012  . Encounter for therapeutic drug monitoring 10/06/2012  . Mental retardation   . Thrombocytopenia ([place]) 05/23/2011  . Seizures ([place]) 03/21/2011  . GERD (gastroesophageal reflux disease) 03/21/2011  . CLOSED FRACTURE OF ACROMIAL END OF CLAVICLE 07/13/2008   Past Medical History:  Diagnosis  Date  . Constipation   . Dysphagia   . Fracture    R foot  . GERD (gastroesophageal reflux disease)   . Mental retardation    lesion in head  . Seizures ([place])    "not fully controlled on max doses of meds" (Neuro ofc note 12/2014)  . Thrombocytopenia ([place])   . Tremor     Family History  Problem Relation Age of Onset  . High Cholesterol Mother   . High blood pressure Mother   . Diabetes Father     Past Surgical History:  Procedure Laterality Date  . BIOPSY  09/16/2014   Procedure: BIOPSY;  Surgeon: Rogene Houston, MD;  Location: [facility];  Service: Endoscopy;;  . CATARACT EXTRACTION     both eyes, May of 2015  . ESOPHAGEAL DILATION N/A 09/16/2014   Procedure: ESOPHAGEAL DILATION WITH 54FR MALONEY DILATOR;  Surgeon: Rogene Houston, MD;  Location: [facility];  Service: Endoscopy;  Laterality: N/A;  . ESOPHAGOGASTRODUODENOSCOPY (EGD) WITH PROPOFOL N/A 09/16/2014   Procedure: ESOPHAGOGASTRODUODENOSCOPY (EGD) WITH PROPOFOL;  Surgeon: Rogene Houston, MD;  Location: [facility];  Service: Endoscopy;  Laterality: N/A;  . MOUTH SURGERY    . Skin graft to gum Right  08/2013  . TONSILLECTOMY AND ADENOIDECTOMY    . TOTAL ABDOMINAL HYSTERECTOMY     Social History   Occupational History  .  Unemployed   Social History Main Topics  . Smoking status: Former Smoker    Packs/day: 1.50    Years: 15.00    Types: Cigarettes    Quit date: 05/22/2006  . Smokeless tobacco: Never Used  . Alcohol use No  . Drug use: No  . Sexual activity: No     Comment: Hysterectomy

## 2016-04-16 DIAGNOSIS — H52203 Unspecified astigmatism, bilateral: Secondary | ICD-10-CM | POA: Diagnosis not present

## 2016-04-16 DIAGNOSIS — H21233 Degeneration of iris (pigmentary), bilateral: Secondary | ICD-10-CM | POA: Diagnosis not present

## 2016-04-16 DIAGNOSIS — H26493 Other secondary cataract, bilateral: Secondary | ICD-10-CM | POA: Diagnosis not present

## 2016-04-18 NOTE — Progress Notes (Signed)
mailed

## 2016-04-20 ENCOUNTER — Other Ambulatory Visit: Payer: Self-pay | Admitting: Neurology

## 2016-04-20 DIAGNOSIS — R569 Unspecified convulsions: Secondary | ICD-10-CM

## 2016-04-23 ENCOUNTER — Ambulatory Visit (INDEPENDENT_AMBULATORY_CARE_PROVIDER_SITE_OTHER): Payer: Medicare Other | Admitting: Orthopaedic Surgery

## 2016-04-23 ENCOUNTER — Encounter (INDEPENDENT_AMBULATORY_CARE_PROVIDER_SITE_OTHER): Payer: Self-pay | Admitting: Orthopaedic Surgery

## 2016-04-23 ENCOUNTER — Telehealth (INDEPENDENT_AMBULATORY_CARE_PROVIDER_SITE_OTHER): Payer: Self-pay | Admitting: Orthopaedic Surgery

## 2016-04-23 ENCOUNTER — Ambulatory Visit (INDEPENDENT_AMBULATORY_CARE_PROVIDER_SITE_OTHER): Payer: Medicare Other | Admitting: Orthopedic Surgery

## 2016-04-23 VITALS — BP 105/68 | HR 64 | Ht 66.0 in | Wt 140.0 lb

## 2016-04-23 DIAGNOSIS — M76821 Posterior tibial tendinitis, right leg: Secondary | ICD-10-CM | POA: Insufficient documentation

## 2016-04-23 NOTE — Progress Notes (Signed)
Office Visit Note   Patient: Maria [name]           Date of Birth: [birth year]           MRN: [id number] Visit Date: 04/23/2016              Requested by: Albin [id number] [address], [place] [address] PCP: [facility]   Assessment & Plan: Visit Diagnoses:  1. Posterior tibial tendinitis, right leg   Previous bimalleolar ankle fracture nondisplaced treated with cast  Plan: Patient will go back in her cam boot to help breast posterior tibial tendon she can use some Aspercreme continue with elevation use occasional ibuprofen and she's been doing. I'll recheck her in the evening clinic in 6 weeks. If she has persistent problems we will have to consider a diagnostic MRI imaging. Review of previous x-rays showed no areas of bone spurs adjacent to posterior tibial tendon that could cause some areas of irritation  Follow-Up Instructions: Return in about 6 weeks (around 06/04/2016).   Orders:  No orders of the defined types were placed in this encounter.  No orders of the defined types were placed in this encounter.     Procedures: No procedures performed   Clinical Data: No additional findings.   Subjective: Chief Complaint  Patient presents with  . Right Leg - Pain  . Right Foot - Pain    Patient returns for work in appointment. She was seen on 04/12/2016.  She has not gotten any better. Her mother states that she has had continued swelling and says that she hurts wherever she touches it. The patient points across top of foot, up medial side, and up medial lower leg to knee.  She is taking ibuprofen with some relief, however she thinks that a pain pill would help her more.     Review of Systems  Constitutional: Negative for chills and diaphoresis.  HENT: Negative for ear discharge, ear pain and nosebleeds.   Eyes: Negative for discharge and visual disturbance.  Respiratory: Negative for cough, choking and shortness of  breath.   Cardiovascular: Negative for chest pain and palpitations.  Gastrointestinal: Negative for abdominal distention and abdominal pain.  Endocrine: Negative for cold intolerance and heat intolerance.  Genitourinary: Negative for flank pain and hematuria.  Skin: Negative for rash and wound.  Neurological: Negative for seizures and speech difficulty.  Hematological: Negative for adenopathy. Does not bruise/bleed easily.  Psychiatric/Behavioral: Positive for agitation and behavioral problems. Negative for suicidal ideas.       Patient has some learning difficulties with decreased mental acuity. She particularly enjoys coloring books and [place] Girls on TV      Objective: Vital Signs: BP 105/68   Pulse 64   Ht 5\' 6"  (1.676 m)   Wt 140 lb (63.5 kg)   BMI 22.60 kg/m   Physical Exam  Constitutional: She is oriented to person, place, and time. She appears well-developed.  HENT:  Head: Normocephalic.  Right Ear: External ear normal.  Left Ear: External ear normal.  Eyes: Pupils are equal, round, and reactive to light.  Neck: No tracheal deviation present. No thyromegaly present.  Cardiovascular: Normal rate.   Pulmonary/Chest: Effort normal.  Abdominal: Soft.  Neurological: She is alert and oriented to person, place, and time.  Skin: Skin is warm and dry.  Psychiatric:  Patient has been encouraged to converse. She gets agitated.  Patient is here with her mother who provides the history  and review of systems information.  Ortho Exam there is tenderness of posterior tibial tendon from the medial midfoot followed proximally to the distal third of the medial tibia posteriorly. Pain with resisted testing some mild pitting edema. She does have toe flexion she ambulates with a short stride gait foot in external Rotation. She still has dorsal foot swelling with lack of prominence of the dorsal veins noted. Slight darkening of the dorsum of the skin no tenderness over the saphenous vein.  Negative Homans. Negative popliteal compression test no sciatic notch tenderness  Specialty Comments:  No specialty comments available.  Imaging: No results found.   PMFS History: Patient Active Problem List   Diagnosis Date Noted  . Posterior tibial tendinitis, right leg 04/23/2016  . Pain in right foot 04/12/2016  . Acute encephalopathy   . Myoclonus   . Frequent falls 11/09/2015  . Fall 11/09/2015  . Chest pain 03/16/2014  . Upper abdominal pain 03/16/2014  . Seizure disorder ([place]) 03/16/2014  . 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 Generalized convulsive epilepsy ([place]) 10/06/2012  . Encounter for therapeutic drug monitoring 10/06/2012  . Mental retardation   . Thrombocytopenia ([place]) 05/23/2011  . Seizures ([place]) 03/21/2011  . GERD (gastroesophageal reflux disease) 03/21/2011  . CLOSED FRACTURE OF ACROMIAL END OF CLAVICLE 07/13/2008   Past Medical History:  Diagnosis Date  . Constipation   . Dysphagia   . Fracture    R foot  . GERD (gastroesophageal reflux disease)   . Mental retardation    lesion in head  . Seizures ([place])    "not fully controlled on max doses of meds" (Neuro ofc note 12/2014)  . Thrombocytopenia ([place])   . Tremor     Family History  Problem Relation Age of Onset  . High Cholesterol Mother   . High blood pressure Mother   . Diabetes Father     Past Surgical History:  Procedure Laterality Date  . BIOPSY  09/16/2014   Procedure: BIOPSY;  Surgeon: Rogene Houston, MD;  Location: [facility];  Service: Endoscopy;;  . CATARACT EXTRACTION     both eyes, May of 2015  . ESOPHAGEAL DILATION N/A 09/16/2014   Procedure: ESOPHAGEAL DILATION WITH 54FR MALONEY DILATOR;  Surgeon: Rogene Houston, MD;  Location: [facility];  Service: Endoscopy;  Laterality: N/A;  . ESOPHAGOGASTRODUODENOSCOPY (EGD) WITH PROPOFOL N/A 09/16/2014   Procedure: ESOPHAGOGASTRODUODENOSCOPY (EGD) WITH PROPOFOL;  Surgeon: Rogene Houston, MD;  Location: [facility];  Service: Endoscopy;   Laterality: N/A;  . MOUTH SURGERY    . Skin graft to gum Right 08/2013  . TONSILLECTOMY AND ADENOIDECTOMY    . TOTAL ABDOMINAL HYSTERECTOMY     Social History   Occupational History  .  Unemployed   Social History Main Topics  . Smoking status: Former Smoker    Packs/day: 1.50    Years: 15.00    Types: Cigarettes    Quit date: 05/22/2006  . Smokeless tobacco: Never Used  . Alcohol use No  . Drug use: No  . Sexual activity: No     Comment: Hysterectomy

## 2016-04-23 NOTE — Telephone Encounter (Signed)
Tried to call patient, no answer and no machine, I will try to call her again later.

## 2016-04-23 NOTE — Telephone Encounter (Signed)
REQUESTING A COPY OF HER XRAYS

## 2016-04-24 NOTE — Telephone Encounter (Signed)
I called and spoke with patients mother and she states that they no longer need the Cd.  So I shredded the disc.

## 2016-04-28 NOTE — Progress Notes (Signed)
PATIENT: Maria [name] DOB: 1960/12/03  REASON FOR VISIT: follow up- intractable seizures HISTORY FROM: patient  HISTORY OF PRESENT ILLNESS: Maria [name] is a 55 year old female with a history of intractable seizures. She returns today for follow-up. The patient reports that she is not had any additional seizures since the last visit. She reports that she did have a fall but states that she slipped and fell. She continues on Depakote, Vimpat and Klonopin. Her mom notices that she is a little more sleepy with these medications. She was at home with her mother. Able to complete all ADLs and family. She does not operate a motor vehicle. She currently has a boot on the left foot due to ankle injury. She denies any new neurological symptoms. She returns today for an evaluation.  HISTORY 12/25/15: Maria [name] is a 55 year old right-handed white female with a history of intractable seizures. The patient was admitted to the hospital on 11/09/15 with altered mental status associated with multiple seizures. The patient has had some adjustment of her seizure medications, clonazepam was added and seems to be helpful. The patient does have some drowsiness on the medication , but she has not had any recurrent seizures since last seen. She remains on the Vimpat and the Depakote. The patient has had a fracture of the right ankle, she still has some discomfort with this but it is getting much better, she is able to ambulate fairly well. She returns for an evaluation. A recent Depakote level was therapeutic.  REVIEW OF SYSTEMS: Out of a complete 14 system review of symptoms, the patient complains only of the following symptoms, and all other reviewed systems are negative.  Activity change  ALLERGIES: Allergies  Allergen Reactions  . Codeine Nausea And Vomiting  . Lamictal [Lamotrigine] Rash    HOME MEDICATIONS: Outpatient Medications Prior to Visit  Medication Sig Dispense Refill  . Calcium  Carb-Cholecalciferol (CALCIUM 500 +D) 500-400 MG-UNIT TABS Take 1 tablet by mouth 2 (two) times daily.    . Calcium Carbonate Antacid (TUMS PO) Take 1 tablet by mouth daily as needed (heartburn).     . cholecalciferol (VITAMIN D) 1000 UNITS tablet Take 1,000 Units by mouth daily. Reported on 05/30/2015    . clonazePAM (KLONOPIN) 0.5 MG tablet Take 1 tablet (0.5 mg total) by mouth 2 (two) times daily. 180 tablet 1  . divalproex (DEPAKOTE ER) 500 MG 24 hr tablet TAKE 1 TABLET TWICE A DAY 180 tablet 0  . docusate sodium (COLACE) 100 MG capsule Take 100 mg by mouth daily.    Marland Kitchen estradiol (VIVELLE-DOT) 0.075 MG/24HR Place 1 patch onto the skin 2 (two) times a week. On Wednesday and Saturday    . feeding supplement, ENSURE ENLIVE, (ENSURE ENLIVE) LIQD Take 237 mLs by mouth 2 (two) times daily between meals. 237 mL 12  . HYDROcodone-acetaminophen (NORCO/VICODIN) 5-325 MG tablet Take 1 tablet my mouth every 4 hours as needed for pain. 5 tablet 0  . ibuprofen (ADVIL,MOTRIN) 200 MG tablet Take 200 mg by mouth every 6 (six) hours as needed.    . lacosamide (VIMPAT) 200 MG TABS tablet Take 1 tablet (200 mg total) by mouth 2 (two) times daily. 180 tablet 1  . levocetirizine (XYZAL) 5 MG tablet Take 5 mg by mouth every evening.     Marland Kitchen omeprazole (PRILOSEC) 40 MG capsule Take 1 capsule (40 mg total) by mouth daily. 90 capsule 3  . oxybutynin (DITROPAN-XL) 5 MG 24 hr tablet Take 5 mg  by mouth daily.    . Pediatric Multiple Vit-C-FA (PEDIATRIC MULTIVITAMIN) chewable tablet Chew 1 tablet by mouth daily.       No facility-administered medications prior to visit.     PAST MEDICAL HISTORY: Past Medical History:  Diagnosis Date  . Constipation   . Dysphagia   . Fracture    R foot  . GERD (gastroesophageal reflux disease)   . Mental retardation    lesion in head  . Seizures ([place])    "not fully controlled on max doses of meds" (Neuro ofc note 12/2014)  . Thrombocytopenia ([place])   . Tremor     PAST SURGICAL  HISTORY: Past Surgical History:  Procedure Laterality Date  . BIOPSY  09/16/2014   Procedure: BIOPSY;  Surgeon: Rogene Houston, MD;  Location: [facility];  Service: Endoscopy;;  . CATARACT EXTRACTION     both eyes, May of 2015  . ESOPHAGEAL DILATION N/A 09/16/2014   Procedure: ESOPHAGEAL DILATION WITH 54FR MALONEY DILATOR;  Surgeon: Rogene Houston, MD;  Location: [facility];  Service: Endoscopy;  Laterality: N/A;  . ESOPHAGOGASTRODUODENOSCOPY (EGD) WITH PROPOFOL N/A 09/16/2014   Procedure: ESOPHAGOGASTRODUODENOSCOPY (EGD) WITH PROPOFOL;  Surgeon: Rogene Houston, MD;  Location: [facility];  Service: Endoscopy;  Laterality: N/A;  . MOUTH SURGERY    . Skin graft to gum Right 08/2013  . TONSILLECTOMY AND ADENOIDECTOMY    . TOTAL ABDOMINAL HYSTERECTOMY      FAMILY HISTORY: Family History  Problem Relation Age of Onset  . High Cholesterol Mother   . High blood pressure Mother   . Diabetes Father     SOCIAL HISTORY: Social History   Social History  . Marital status: Single    Spouse name: N/A  . Number of children: 0  . Years of education: 10   Occupational History  .  Unemployed   Social History Main Topics  . Smoking status: Former Smoker    Packs/day: 1.50    Years: 15.00    Types: Cigarettes    Quit date: 05/22/2006  . Smokeless tobacco: Never Used  . Alcohol use No  . Drug use: No  . Sexual activity: No     Comment: Hysterectomy   Other Topics Concern  . Not on file   Social History Narrative   Patient is single and lives with her mother Maria [name] patient. Advised patient of provider's approval for requested procedure, as well as any comments/instructions from provider.       Provided patient w/ verbal instructions concerning pre-, intra- and post-procedure preparation and instructions.      Patient verbalized understanding of the above.       Patient has also been mailed a letter containing the following instructions       Patient drinks 3 cups of caffeine daily.    Patient is right handed.               PHYSICAL EXAM  Vitals:   04/29/16 1410  BP: (!) 103/58  Pulse: 61  Weight: 140 lb (63.5 kg)  Height: 5\' 6"  (1.676 m)   Body mass index is 22.6 kg/m.  Generalized: Well developed, in no acute distress   Neurological examination  Mentation: Alert oriented to time, place, history taking. Follows all commands speech and language fluent Cranial nerve II-XII: Pupils were equal round reactive to light. Extraocular movements were full, visual field were full on confrontational test. Facial sensation and strength were normal. Uvula tongue midline. Head turning and shoulder shrug  were normal and symmetric. Motor: The motor testing reveals 5 over 5 strength of all 4 extremities. Good symmetric motor tone is noted throughout.  Sensory: Sensory testing is intact to soft touch on all 4 extremities. No evidence of extinction is noted.  Coordination: Cerebellar testing reveals good finger-nose-finger and heel-to-shin bilaterally.  Gait and station: Gait is slightly unsteady due to boot on the right foot. Tandem gait not attempted. Reflexes: Deep tendon reflexes are symmetric and normal bilaterally.   DIAGNOSTIC DATA (LABS, IMAGING, TESTING) - I reviewed patient records, labs, notes, testing and imaging myself where available.  Lab Results  Component Value Date   WBC 6.9 04/09/2016   HGB 12.4 04/09/2016   HCT 37.6 04/09/2016   MCV 95.9 04/09/2016   PLT 133 (L) 04/09/2016      Component Value Date/Time   NA 138 11/09/2015 1727   NA 138 09/14/2015 1407   K 3.9 11/09/2015 1727   CL 105 11/09/2015 1727   CO2 27 11/09/2015 1727   GLUCOSE 96 11/09/2015 1727   BUN 17 11/09/2015 1727   BUN 20 09/14/2015 1407   CREATININE 0.84 11/09/2015 1727   CALCIUM 9.8 11/09/2015 1727   PROT 6.4 09/14/2015 1407   ALBUMIN 3.5 11/10/2015 1417   ALBUMIN 4.3 09/14/2015 1407   AST 13 09/14/2015 1407   ALT 14 09/14/2015 1407   ALKPHOS 53 09/14/2015 1407    BILITOT 0.3 09/14/2015 1407   GFRNONAA >60 11/09/2015 1727   GFRAA >60 11/09/2015 1727        ASSESSMENT AND PLAN 55 y.o. year old female  has a past medical history of Constipation; Dysphagia; Fracture; GERD (gastroesophageal reflux disease); Mental retardation; Seizures ([employer]); Thrombocytopenia ([place]); and Tremor. here with:  1. Seizures  Overall the patient is doing well. She'll continue on Depakote, Vimpat and Klonopin. I will check blood work today. Patient advised that she has any seizure event she should let us know. Will follow-up in 6 months or sooner if needed.     Ward Givens, MSN, NP-C 04/28/2016, 8:19 PM [facility] [address] [place], [place] [address] [phone]

## 2016-04-29 ENCOUNTER — Ambulatory Visit (INDEPENDENT_AMBULATORY_CARE_PROVIDER_SITE_OTHER): Payer: Medicare Other | Admitting: Adult Health

## 2016-04-29 ENCOUNTER — Encounter: Payer: Self-pay | Admitting: Adult Health

## 2016-04-29 VITALS — BP 103/58 | HR 61 | Ht 66.0 in | Wt 140.0 lb

## 2016-04-29 DIAGNOSIS — Z5181 Encounter for therapeutic drug level monitoring: Secondary | ICD-10-CM | POA: Diagnosis not present

## 2016-04-29 DIAGNOSIS — R569 Unspecified convulsions: Secondary | ICD-10-CM | POA: Diagnosis not present

## 2016-04-29 MED ORDER — DIVALPROEX SODIUM ER 500 MG PO TB24: 500.0000 mg | ORAL_TABLET | Freq: Two times a day (BID) | ORAL | 3 refills | Status: DC

## 2016-04-29 MED ORDER — LACOSAMIDE 200 MG PO TABS: 200.0000 mg | ORAL_TABLET | Freq: Two times a day (BID) | ORAL | 0 refills | Status: DC

## 2016-04-29 NOTE — Patient Instructions (Signed)
Continue Vimpat, Depakote and Klonopin  Blood work today If your symptoms worsen or you develop new symptoms please let us know.

## 2016-04-29 NOTE — Progress Notes (Signed)
I have read the note, and I agree with the clinical assessment and plan.  Maria [name],Maria [name]   

## 2016-04-30 LAB — CBC WITH DIFFERENTIAL/PLATELET
Basophils Absolute: 0 10*3/uL (ref 0.0–0.2)
Basos: 0 %
EOS (ABSOLUTE): 0.1 10*3/uL (ref 0.0–0.4)
Eos: 1 %
HEMATOCRIT: 36.5 % (ref 34.0–46.6)
Hemoglobin: 12 g/dL (ref 11.1–15.9)
IMMATURE GRANS (ABS): 0 10*3/uL (ref 0.0–0.1)
Immature Granulocytes: 0 %
Lymphocytes Absolute: 2.3 10*3/uL (ref 0.7–3.1)
Lymphs: 23 %
MCH: 31.3 pg (ref 26.6–33.0)
MCHC: 32.9 g/dL (ref 31.5–35.7)
MCV: 95 fL (ref 79–97)
MONOS ABS: 0.8 10*3/uL (ref 0.1–0.9)
Monocytes: 8 %
NEUTROS ABS: 6.7 10*3/uL (ref 1.4–7.0)
Neutrophils: 68 %
PLATELETS: 133 10*3/uL — AB (ref 150–379)
RBC: 3.84 x10E6/uL (ref 3.77–5.28)
RDW: 14.6 % (ref 12.3–15.4)
WBC: 9.9 10*3/uL (ref 3.4–10.8)

## 2016-04-30 LAB — COMPREHENSIVE METABOLIC PANEL
A/G RATIO: 1.8 (ref 1.2–2.2)
ALBUMIN: 4.3 g/dL (ref 3.5–5.5)
ALT: 19 IU/L (ref 0–32)
AST: 18 IU/L (ref 0–40)
Alkaline Phosphatase: 81 IU/L (ref 39–117)
BUN/Creatinine Ratio: 34 — ABNORMAL HIGH (ref 9–23)
BUN: 24 mg/dL (ref 6–24)
Bilirubin Total: 0.3 mg/dL (ref 0.0–1.2)
CALCIUM: 9.4 mg/dL (ref 8.7–10.2)
CO2: 24 mmol/L (ref 18–29)
Chloride: 99 mmol/L (ref 96–106)
Creatinine, Ser: 0.71 mg/dL (ref 0.57–1.00)
GFR, EST AFRICAN AMERICAN: 112 mL/min/{1.73_m2} (ref >59)
GFR, EST NON AFRICAN AMERICAN: 97 mL/min/{1.73_m2} (ref >59)
GLOBULIN, TOTAL: 2.4 g/dL (ref 1.5–4.5)
Glucose: 95 mg/dL (ref 65–99)
POTASSIUM: 4.2 mmol/L (ref 3.5–5.2)
SODIUM: 141 mmol/L (ref 134–144)
TOTAL PROTEIN: 6.7 g/dL (ref 6.0–8.5)

## 2016-04-30 LAB — VALPROIC ACID LEVEL: Valproic Acid Lvl: 106 ug/mL — ABNORMAL HIGH (ref 50–100)

## 2016-04-30 LAB — AMMONIA: Ammonia: 46 ug/dL (ref 19–87)

## 2016-04-30 NOTE — Progress Notes (Signed)
Fax confirmation received vimpat [facility].

## 2016-05-01 ENCOUNTER — Telehealth: Payer: Self-pay | Admitting: *Deleted

## 2016-05-01 DIAGNOSIS — R569 Unspecified convulsions: Secondary | ICD-10-CM

## 2016-05-01 NOTE — Telephone Encounter (Signed)
-----   Message from Ward Givens, NP sent at 05/01/2016 12:12 PM EST ----- Depakote is slightly elevated. The patient will need to come in in 1 month to have this rechecked. It has been elevated in the past. Please asked the patient if she had taken her medication that day. This may not be a trough level.

## 2016-05-02 NOTE — Telephone Encounter (Signed)
Attempted to call pts mom, and she was not available yet.  Will call tomorrow.

## 2016-05-02 NOTE — Telephone Encounter (Signed)
Spoke to pt and relayed the lab results to her.  I told her I would call back in 30 minutes to speak with her mom, and if not get her would send result letter in mail.  She said ok.

## 2016-05-03 NOTE — Telephone Encounter (Signed)
Spoke to mother and relayed that depakote slightly elevated.  Repeat in about a month at trough level.  She will go to [facility] closer to home [employer] in [place]).   (pt takes her medication 1000/2200).  Will mail her requisition so to go at there convenience.  Mother verbalized understanding.

## 2016-05-06 ENCOUNTER — Ambulatory Visit (INDEPENDENT_AMBULATORY_CARE_PROVIDER_SITE_OTHER): Payer: Medicare Other | Admitting: Orthopedic Surgery

## 2016-05-07 ENCOUNTER — Ambulatory Visit (INDEPENDENT_AMBULATORY_CARE_PROVIDER_SITE_OTHER): Payer: Medicare Other | Admitting: Orthopedic Surgery

## 2016-05-13 DIAGNOSIS — Z803 Family history of malignant neoplasm of breast: Secondary | ICD-10-CM | POA: Diagnosis not present

## 2016-05-13 DIAGNOSIS — Z1231 Encounter for screening mammogram for malignant neoplasm of breast: Secondary | ICD-10-CM | POA: Diagnosis not present

## 2016-05-23 DIAGNOSIS — N6489 Other specified disorders of breast: Secondary | ICD-10-CM | POA: Diagnosis not present

## 2016-05-29 ENCOUNTER — Other Ambulatory Visit: Payer: Self-pay | Admitting: Adult Health

## 2016-05-29 DIAGNOSIS — R569 Unspecified convulsions: Secondary | ICD-10-CM | POA: Diagnosis not present

## 2016-05-30 LAB — VALPROIC ACID LEVEL: VALPROIC ACID LVL: 84 ug/mL (ref 50–100)

## 2016-06-05 ENCOUNTER — Telehealth: Payer: Self-pay | Admitting: *Deleted

## 2016-06-05 NOTE — Telephone Encounter (Signed)
Spoke with mother of pt.  Relayed the lab results of depakote level improved and is now in normal range.  She verbalized understanding.

## 2016-06-05 NOTE — Telephone Encounter (Signed)
-----   Message from Ward Givens, NP sent at 06/04/2016  8:19 AM EST ----- depakote level has improved and is now in normal range. Please call the patient. Thanks!

## 2016-06-06 ENCOUNTER — Ambulatory Visit (INDEPENDENT_AMBULATORY_CARE_PROVIDER_SITE_OTHER): Payer: Medicare Other | Admitting: Orthopaedic Surgery

## 2016-07-02 ENCOUNTER — Other Ambulatory Visit: Payer: Self-pay | Admitting: Neurology

## 2016-07-02 DIAGNOSIS — R569 Unspecified convulsions: Secondary | ICD-10-CM

## 2016-07-20 ENCOUNTER — Other Ambulatory Visit: Payer: Self-pay | Admitting: Neurology

## 2016-07-20 DIAGNOSIS — R569 Unspecified convulsions: Secondary | ICD-10-CM

## 2016-07-23 ENCOUNTER — Other Ambulatory Visit: Payer: Self-pay | Admitting: *Deleted

## 2016-07-23 DIAGNOSIS — R569 Unspecified convulsions: Secondary | ICD-10-CM

## 2016-07-23 MED ORDER — DIVALPROEX SODIUM ER 500 MG PO TB24: 500.0000 mg | ORAL_TABLET | Freq: Two times a day (BID) | ORAL | 3 refills | Status: DC

## 2016-08-14 ENCOUNTER — Encounter (INDEPENDENT_AMBULATORY_CARE_PROVIDER_SITE_OTHER): Payer: Self-pay

## 2016-08-14 ENCOUNTER — Encounter (INDEPENDENT_AMBULATORY_CARE_PROVIDER_SITE_OTHER): Payer: Self-pay | Admitting: Internal Medicine

## 2016-08-23 ENCOUNTER — Telehealth: Payer: Self-pay | Admitting: Neurology

## 2016-08-23 DIAGNOSIS — R569 Unspecified convulsions: Secondary | ICD-10-CM

## 2016-08-23 MED ORDER — CLONAZEPAM 0.5 MG PO TABS: 0.5000 mg | ORAL_TABLET | Freq: Two times a day (BID) | ORAL | 0 refills | Status: DC

## 2016-08-23 MED ORDER — LACOSAMIDE 200 MG PO TABS: 200.0000 mg | ORAL_TABLET | Freq: Two times a day (BID) | ORAL | 0 refills | Status: DC

## 2016-08-23 NOTE — Addendum Note (Signed)
Addended by: Rossie Muskrat L on: 08/23/2016 11:51 AM   Modules accepted: Orders

## 2016-08-23 NOTE — Telephone Encounter (Signed)
Printed prescriptions, awaiting MD signature

## 2016-08-23 NOTE — Telephone Encounter (Signed)
Pt mother called asking that a refill be faxed to  [facility] [place]  for  clonazePAM (KLONOPIN) 0.5 MG tablet  lacosamide (VIMPAT) 200 MG TABS tablet

## 2016-08-23 NOTE — Telephone Encounter (Signed)
[facility] Fax: [phone]. Received confirmation.

## 2016-09-04 ENCOUNTER — Ambulatory Visit (INDEPENDENT_AMBULATORY_CARE_PROVIDER_SITE_OTHER): Payer: Medicare Other | Admitting: Internal Medicine

## 2016-09-04 VITALS — BP 118/72 | HR 76 | Temp 98.0°F | Ht 66.0 in | Wt 151.2 lb

## 2016-09-04 DIAGNOSIS — K219 Gastro-esophageal reflux disease without esophagitis: Secondary | ICD-10-CM

## 2016-09-04 NOTE — Patient Instructions (Signed)
OV in 1 yr. 

## 2016-09-04 NOTE — Progress Notes (Signed)
Subjective:    Patient ID: Maria [name], female    DOB: 02/06/1961, 56 y.o.   MRN: [id number]  HPI Here today for f/u. She was last seen in April of 2017. Hx of GERD and maintained on Omeprazole.  Her appetite is good. Her weight today is 151.2. Last weight in April of 2017 was 143. She says she eats a lot of snack.  She says she rarely has heart burn. She has a BM daily. No melena or BRRB.  She underwent an EGD/ED in 2016 for dysphagia. She is not having any dysphagia.    09/16/2014 EGD with ED  Indications: Patient is 56 year old Caucasian female was chronic GERD and presents with dysphagia predominantly solids. She has history of Schatzki's ring and esophagus has been dilated on 3 occasions and more recently in 2011. Patient is also complaining of right foot pain since she have fall few days ago.  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 Noncritical Schatzki's ring and small sliding hiatal hernia. Small hyperplastic appearing polyps at gastric body. These were left alone. Erosive gastritis with small gastric ulcer at antrum. Esophagus dilated by passing 25 French Maloney dilator resulting in linear disruption at GE junction and also it proximal esophagus indicative of disrupted web. Review of Systems Past Medical History:  Diagnosis Date  . Constipation   . Dysphagia   . Fracture    R foot  . GERD (gastroesophageal reflux disease)   . Mental retardation    lesion in head  . Seizures ([place])    "not fully controlled on max doses of meds" (Neuro ofc note 12/2014)  . Thrombocytopenia ([place])   . Tremor     Past Surgical History:  Procedure Laterality Date  . BIOPSY  09/16/2014   Procedure: BIOPSY;  Surgeon: Rogene Houston, MD;  Location: [facility];  Service: Endoscopy;;  . CATARACT EXTRACTION     both eyes, May of 2015  . ESOPHAGEAL DILATION N/A 09/16/2014   Procedure: ESOPHAGEAL DILATION WITH 54FR MALONEY DILATOR;   Surgeon: Rogene Houston, MD;  Location: [facility];  Service: Endoscopy;  Laterality: N/A;  . ESOPHAGOGASTRODUODENOSCOPY (EGD) WITH PROPOFOL N/A 09/16/2014   Procedure: ESOPHAGOGASTRODUODENOSCOPY (EGD) WITH PROPOFOL;  Surgeon: Rogene Houston, MD;  Location: [facility];  Service: Endoscopy;  Laterality: N/A;  . MOUTH SURGERY    . Skin graft to gum Right 08/2013  . TONSILLECTOMY AND ADENOIDECTOMY    . TOTAL ABDOMINAL HYSTERECTOMY      Allergies  Allergen Reactions  . Codeine Nausea And Vomiting  . Lamictal [Lamotrigine] Rash    Current Outpatient Prescriptions on File Prior to Visit  Medication Sig Dispense Refill  . Calcium Carb-Cholecalciferol (CALCIUM 500 +D) 500-400 MG-UNIT TABS Take 1 tablet by mouth 2 (two) times daily.    . Calcium Carbonate Antacid (TUMS PO) Take 1 tablet by mouth daily as needed (heartburn).     . cholecalciferol (VITAMIN D) 1000 UNITS tablet Take 1,000 Units by mouth daily. Reported on 05/30/2015    . clonazePAM (KLONOPIN) 0.5 MG tablet Take 1 tablet (0.5 mg total) by mouth 2 (two) times daily. 180 tablet 0  . divalproex (DEPAKOTE ER) 500 MG 24 hr tablet Take 1 tablet (500 mg total) by mouth 2 (two) times daily. 180 tablet 3  . docusate sodium (COLACE) 100 MG capsule Take 100 mg by mouth daily as needed.     Marland Kitchen estradiol (VIVELLE-DOT) 0.075 MG/24HR Place 1 patch onto the skin 2 (two) times  a week. On Wednesday and Saturday    . feeding supplement, ENSURE ENLIVE, (ENSURE ENLIVE) LIQD Take 237 mLs by mouth 2 (two) times daily between meals. 237 mL 12  . HYDROcodone-acetaminophen (NORCO/VICODIN) 5-325 MG tablet Take 1 tablet my mouth every 4 hours as needed for pain. (Patient not taking: Reported on 04/29/2016) 5 tablet 0  . ibuprofen (ADVIL,MOTRIN) 200 MG tablet Take 200 mg by mouth every 6 (six) hours as needed.    . lacosamide (VIMPAT) 200 MG TABS tablet Take 1 tablet (200 mg total) by mouth 2 (two) times daily. 180 tablet 0  . levocetirizine (XYZAL) 5 MG tablet Take 5 mg  by mouth every evening.     Marland Kitchen omeprazole (PRILOSEC) 40 MG capsule Take 1 capsule (40 mg total) by mouth daily. 90 capsule 3  . oxybutynin (DITROPAN-XL) 5 MG 24 hr tablet Take 5 mg by mouth daily.    . Pediatric Multiple Vit-C-FA (PEDIATRIC MULTIVITAMIN) chewable tablet Chew 1 tablet by mouth daily.       No current facility-administered medications on file prior to visit.        Objective:   Physical Exam  Blood pressure 118/72, pulse 76, temperature 98 F (36.7 C), height 5\' 6"  (1.676 m), weight 151 lb 3.2 oz (68.6 kg).  Alert and oriented. Skin warm and dry. Oral mucosa is moist.   . Sclera anicteric, conjunctivae is pink. Thyroid not enlarged. No cervical lymphadenopathy. Lungs clear. Heart regular rate and rhythm.  Abdomen is soft. Bowel sounds are positive. No hepatomegaly. No abdominal masses felt. No tenderness.  No edema to lower extremities.          Assessment & Plan:  GERD controlled with Omeprazole. She is doing well. She will have OV in 6 months.

## 2016-09-05 ENCOUNTER — Encounter (INDEPENDENT_AMBULATORY_CARE_PROVIDER_SITE_OTHER): Payer: Self-pay | Admitting: Internal Medicine

## 2016-09-16 ENCOUNTER — Ambulatory Visit: Payer: Medicare Other | Admitting: Adult Health

## 2016-09-16 ENCOUNTER — Telehealth: Payer: Self-pay | Admitting: *Deleted

## 2016-09-16 NOTE — Telephone Encounter (Signed)
Attempted to reach patient on home phone. No answer or voice mail. Called sister, Faustino Congress on [place] and advised her that patient's FU will be cancelled today due to power outage. Advised that patient has FU on 10/29/16, and she should keep that appointment.  Patient's sister took May appt information and stated she would call her sister to inform. She verbalized understanding of call.

## 2016-10-01 DIAGNOSIS — Z8619 Personal history of other infectious and parasitic diseases: Secondary | ICD-10-CM

## 2016-10-01 HISTORY — DX: Personal history of other infectious and parasitic diseases: Z86.19

## 2016-10-10 ENCOUNTER — Other Ambulatory Visit (HOSPITAL_COMMUNITY): Payer: Medicare Other

## 2016-10-10 DIAGNOSIS — B029 Zoster without complications: Secondary | ICD-10-CM | POA: Diagnosis not present

## 2016-10-29 ENCOUNTER — Ambulatory Visit (INDEPENDENT_AMBULATORY_CARE_PROVIDER_SITE_OTHER): Payer: Medicare Other | Admitting: Adult Health

## 2016-10-29 ENCOUNTER — Telehealth: Payer: Self-pay | Admitting: *Deleted

## 2016-10-29 ENCOUNTER — Encounter: Payer: Self-pay | Admitting: Adult Health

## 2016-10-29 VITALS — BP 110/67 | HR 74 | Ht 66.0 in | Wt 146.0 lb

## 2016-10-29 DIAGNOSIS — Z5181 Encounter for therapeutic drug level monitoring: Secondary | ICD-10-CM

## 2016-10-29 DIAGNOSIS — G40919 Epilepsy, unspecified, intractable, without status epilepticus: Secondary | ICD-10-CM | POA: Diagnosis not present

## 2016-10-29 NOTE — Patient Instructions (Signed)
Continue Klonopin, Dekapote and Vimpat Blood work  If your symptoms worsen or you develop new symptoms please let us know.

## 2016-10-29 NOTE — Progress Notes (Signed)
I have read the note, and I agree with the clinical assessment and plan.  Maria [name],Maria [name]   

## 2016-10-29 NOTE — Progress Notes (Signed)
PATIENT: Maria [name] DOB: 1960-12-09  REASON FOR VISIT: follow up- intractable seizures HISTORY FROM: patient  HISTORY OF PRESENT ILLNESS: Maria [name] is a 56 year old female with a history of intractable seizures. She returns today for follow-up. The patient reports that she has not had a seizure in a almost one year. She remains on Depakote Klonopin and Vimpat. Her mom notes that she seems more drowsy with Klonopin but she also states the patient does not go to bed until really late. She continues to live at home with her mother. She does not operate a motor vehicle. She is able to complete all ADLs independently. She returns today for an evaluation.  HISTORY 04/29/16:Maria [name] is a 56 year old female with a history of intractable seizures. She returns today for follow-up. The patient reports that she is not had any additional seizures since the last visit. She reports that she did have a fall but states that she slipped and fell. She continues on Depakote, Vimpat and Klonopin. Her mom notices that she is a little more sleepy with these medications. She was at home with her mother. Able to complete all ADLs and family. She does not operate a motor vehicle. She currently has a boot on the left foot due to ankle injury. She denies any new neurological symptoms. She returns today for an evaluation.  HISTORY 12/25/15: Maria [name] is a 56 year old right-handed white female with a history of intractable seizures. The patient was admitted to the hospital on 11/09/15 with altered mental status associated with multiple seizures. The patient has had some adjustment of her seizure medications, clonazepam was added andseems to be helpful. The patient does have some drowsiness on the medication , but she has not had any recurrent seizures since last seen. She remains on the Vimpat and the Depakote. The patient has had a fracture of the right ankle, she still has some discomfort with this but it is getting  much better, she is able to ambulate fairly well. She returns for an evaluation. A recent Depakote level was therapeutic.   REVIEW OF SYSTEMS: Out of a complete 14 system review of symptoms, the patient complains only of the following symptoms, and all other reviewed systems are negative.  Incontinence of bladder, frequency of urination, seizure, agitation, rash, daytime sleepiness, flushing, heat intolerance, environmental allergies, fatigue  ALLERGIES: Allergies  Allergen Reactions  . Codeine Nausea And Vomiting  . Lamictal [Lamotrigine] Rash    HOME MEDICATIONS: Outpatient Medications Prior to Visit  Medication Sig Dispense Refill  . Calcium Carb-Cholecalciferol (CALCIUM 500 +D) 500-400 MG-UNIT TABS Take 1 tablet by mouth 2 (two) times daily.    . Calcium Carbonate Antacid (TUMS PO) Take 1 tablet by mouth daily as needed (heartburn).     . cholecalciferol (VITAMIN D) 1000 UNITS tablet Take 1,000 Units by mouth daily. Reported on 05/30/2015    . clonazePAM (KLONOPIN) 0.5 MG tablet Take 1 tablet (0.5 mg total) by mouth 2 (two) times daily. 180 tablet 0  . divalproex (DEPAKOTE ER) 500 MG 24 hr tablet Take 1 tablet (500 mg total) by mouth 2 (two) times daily. 180 tablet 3  . docusate sodium (COLACE) 100 MG capsule Take 100 mg by mouth daily as needed.     Marland Kitchen estradiol (VIVELLE-DOT) 0.075 MG/24HR Place 1 patch onto the skin 2 (two) times a week. On Wednesday and Saturday    . feeding supplement, ENSURE ENLIVE, (ENSURE ENLIVE) LIQD Take 237 mLs by mouth 2 (two) times  daily between meals. 237 mL 12  . ibuprofen (ADVIL,MOTRIN) 200 MG tablet Take 200 mg by mouth every 6 (six) hours as needed.    . lacosamide (VIMPAT) 200 MG TABS tablet Take 1 tablet (200 mg total) by mouth 2 (two) times daily. 180 tablet 0  . levocetirizine (XYZAL) 5 MG tablet Take 5 mg by mouth every evening.     Marland Kitchen omeprazole (PRILOSEC) 40 MG capsule Take 1 capsule (40 mg total) by mouth daily. 90 capsule 3  . oxybutynin  (DITROPAN-XL) 5 MG 24 hr tablet Take 5 mg by mouth daily.    . Pediatric Multiple Vit-C-FA (PEDIATRIC MULTIVITAMIN) chewable tablet Chew 1 tablet by mouth daily.      Marland Kitchen HYDROcodone-acetaminophen (NORCO/VICODIN) 5-325 MG tablet Take 1 tablet my mouth every 4 hours as needed for pain. (Patient not taking: Reported on 10/29/2016) 5 tablet 0   No facility-administered medications prior to visit.     PAST MEDICAL HISTORY: Past Medical History:  Diagnosis Date  . Constipation   . Dysphagia   . Fracture    R foot  . GERD (gastroesophageal reflux disease)   . History of shingles 10/2016  . Mental retardation    lesion in head  . Seizures ([place])    "not fully controlled on max doses of meds" (Neuro ofc note 12/2014)  . Thrombocytopenia ([place])   . Tremor     PAST SURGICAL HISTORY: Past Surgical History:  Procedure Laterality Date  . BIOPSY  09/16/2014   Procedure: BIOPSY;  Surgeon: Rogene Houston, MD;  Location: [facility];  Service: Endoscopy;;  . CATARACT EXTRACTION     both eyes, May of 2015  . ESOPHAGEAL DILATION N/A 09/16/2014   Procedure: ESOPHAGEAL DILATION WITH 54FR MALONEY DILATOR;  Surgeon: Rogene Houston, MD;  Location: [facility];  Service: Endoscopy;  Laterality: N/A;  . ESOPHAGOGASTRODUODENOSCOPY (EGD) WITH PROPOFOL N/A 09/16/2014   Procedure: ESOPHAGOGASTRODUODENOSCOPY (EGD) WITH PROPOFOL;  Surgeon: Rogene Houston, MD;  Location: [facility];  Service: Endoscopy;  Laterality: N/A;  . MOUTH SURGERY    . Skin graft to gum Right 08/2013  . TONSILLECTOMY AND ADENOIDECTOMY    . TOTAL ABDOMINAL HYSTERECTOMY      FAMILY HISTORY: Family History  Problem Relation Age of Onset  . High Cholesterol Mother   . High blood pressure Mother   . Diabetes Father     SOCIAL HISTORY: Social History   Social History  . Marital status: Single    Spouse name: N/A  . Number of children: 0  . Years of education: 10   Occupational History  .  Unemployed   Social History Main Topics  . Smoking  status: Former Smoker    Packs/day: 1.50    Years: 15.00    Types: Cigarettes    Quit date: 05/22/2006  . Smokeless tobacco: Never Used  . Alcohol use No  . Drug use: No  . Sexual activity: No     Comment: Hysterectomy   Other Topics Concern  . Not on file   Social History Narrative   Patient is single and lives with her mother [name] patient. Advised patient of provider's approval for requested procedure, as well as any comments/instructions from provider.       Provided patient w/ verbal instructions concerning pre-, intra- and post-procedure preparation and instructions.      Patient verbalized understanding of the above.       Patient has also been mailed a letter containing the following instructions :  Patient drinks 3 cups of caffeine daily.   Patient is right handed.               PHYSICAL EXAM  Vitals:   10/29/16 1316  BP: 110/67  Pulse: 74  Weight: 146 lb (66.2 kg)  Height: 5\' 6"  (1.676 m)   Body mass index is 23.57 kg/m.  Generalized: Well developed, in no acute distress   Neurological examination  Mentation: Alert oriented to time, place, history taking. Follows all commands speech and language fluent Cranial nerve II-XII: Pupils were equal round reactive to light. Extraocular movements were full, visual field were full on confrontational test. Facial sensation and strength were normal. Uvula tongue midline. Head turning and shoulder shrug  were normal and symmetric. Motor: The motor testing reveals 5 over 5 strength of all 4 extremities. Good symmetric motor tone is noted throughout.  Sensory: Sensory testing is intact to soft touch on all 4 extremities. No evidence of extinction is noted.  Coordination: Cerebellar testing reveals good finger-nose-finger and heel-to-shin bilaterally.  Gait and station: Gait is normal. Tandem gait is normal. Romberg is negative. No drift is seen.  Reflexes: Deep tendon reflexes are symmetric and normal  bilaterally.   DIAGNOSTIC DATA (LABS, IMAGING, TESTING) - I reviewed patient records, labs, notes, testing and imaging myself where available.  Lab Results  Component Value Date   WBC 9.9 04/29/2016   HGB 12.4 04/09/2016   HCT 36.5 04/29/2016   MCV 95 04/29/2016   PLT 133 (L) 04/29/2016      Component Value Date/Time   NA 141 04/29/2016 1437   K 4.2 04/29/2016 1437   CL 99 04/29/2016 1437   CO2 24 04/29/2016 1437   GLUCOSE 95 04/29/2016 1437   GLUCOSE 96 11/09/2015 1727   BUN 24 04/29/2016 1437   CREATININE 0.71 04/29/2016 1437   CALCIUM 9.4 04/29/2016 1437   PROT 6.7 04/29/2016 1437   ALBUMIN 4.3 04/29/2016 1437   AST 18 04/29/2016 1437   ALT 19 04/29/2016 1437   ALKPHOS 81 04/29/2016 1437   BILITOT 0.3 04/29/2016 1437   GFRNONAA 97 04/29/2016 1437   GFRAA 112 04/29/2016 1437   Lab Results  Component Value Date   CHOL  10/13/2009    169        ATP III CLASSIFICATION:  <200     mg/dL   Desirable  200-239  mg/dL   Borderline High  >=240    mg/dL   High          HDL 47 10/13/2009   LDLCALC (H) 10/13/2009    108        Total Cholesterol/HDL:CHD Risk Coronary Heart Disease Risk Table                     Men   Women  1/2 Average Risk   3.4   3.3  Average Risk       5.0   4.4  2 X Average Risk   9.6   7.1  3 X Average Risk  23.4   11.0        Use the calculated Patient Ratio above and the CHD Risk Table to determine the patient's CHD Risk.        ATP III CLASSIFICATION (LDL):  <100     mg/dL   Optimal  100-129  mg/dL   Near or Above  Optimal  130-159  mg/dL   Borderline  160-189  mg/dL   High  >190     mg/dL   Very High   TRIG 69 10/13/2009   CHOLHDL 3.6 10/13/2009   No results found for: HGBA1C Lab Results  Component Value Date   VITAMINB12 765 05/23/2011   Lab Results  Component Value Date   TSH 2.660 03/16/2014      ASSESSMENT AND PLAN 56 y.o. year old female  has a past medical history of Constipation; Dysphagia;  Fracture; GERD (gastroesophageal reflux disease); History of shingles (10/2016); Mental retardation; Seizures ([place]); Thrombocytopenia ([place]); and Tremor. here with:  1. Seizures  Overall the patient is doing well. She will continue on Depakote Klonopin and Vimpat. I will check blood work. However she would like to do this in [place]. I am amenable to this plan. If she has any additional seizures she should let us know. She will follow-up in one year or sooner if needed.  I spent 25 minutes with the patient 50% of this time was spent counseling the patient on seizure precautions.     Ward Givens, MSN, NP-C 10/29/2016, 1:43 PM [facility] [address], [place], [place] [address] [phone]

## 2016-10-29 NOTE — Telephone Encounter (Signed)
Pt has had shingles 2 wks ago, no open lesions, just red.  (keeps covered).  I relayed can reschedule if she wants but she was ok to come.  Wanted to make sure MM ok with this.  I said it was ok.

## 2016-11-05 ENCOUNTER — Other Ambulatory Visit: Payer: Self-pay | Admitting: Adult Health

## 2016-11-05 DIAGNOSIS — Z5181 Encounter for therapeutic drug level monitoring: Secondary | ICD-10-CM | POA: Diagnosis not present

## 2016-11-08 LAB — COMPREHENSIVE METABOLIC PANEL
ALBUMIN: 4.2 g/dL (ref 3.5–5.5)
ALT: 17 IU/L (ref 0–32)
AST: 20 IU/L (ref 0–40)
Albumin/Globulin Ratio: 1.6 (ref 1.2–2.2)
Alkaline Phosphatase: 60 IU/L (ref 39–117)
BILIRUBIN TOTAL: 0.3 mg/dL (ref 0.0–1.2)
BUN / CREAT RATIO: 32 — AB (ref 9–23)
BUN: 25 mg/dL — AB (ref 6–24)
CALCIUM: 9.8 mg/dL (ref 8.7–10.2)
CHLORIDE: 103 mmol/L (ref 96–106)
CO2: 24 mmol/L (ref 18–29)
CREATININE: 0.79 mg/dL (ref 0.57–1.00)
GFR calc non Af Amer: 85 mL/min/{1.73_m2} (ref >59)
GFR, EST AFRICAN AMERICAN: 98 mL/min/{1.73_m2} (ref >59)
GLUCOSE: 86 mg/dL (ref 65–99)
Globulin, Total: 2.6 g/dL (ref 1.5–4.5)
Potassium: 4.4 mmol/L (ref 3.5–5.2)
Sodium: 143 mmol/L (ref 134–144)
TOTAL PROTEIN: 6.8 g/dL (ref 6.0–8.5)

## 2016-11-08 LAB — CBC WITH DIFFERENTIAL/PLATELET
BASOS ABS: 0 10*3/uL (ref 0.0–0.2)
Basos: 0 %
EOS (ABSOLUTE): 0.1 10*3/uL (ref 0.0–0.4)
Eos: 2 %
HEMOGLOBIN: 13 g/dL (ref 11.1–15.9)
Hematocrit: 38.2 % (ref 34.0–46.6)
IMMATURE GRANS (ABS): 0 10*3/uL (ref 0.0–0.1)
Immature Granulocytes: 0 %
LYMPHS: 55 %
Lymphocytes Absolute: 2.6 10*3/uL (ref 0.7–3.1)
MCH: 32 pg (ref 26.6–33.0)
MCHC: 34 g/dL (ref 31.5–35.7)
MCV: 94 fL (ref 79–97)
MONOCYTES: 10 %
Monocytes Absolute: 0.5 10*3/uL (ref 0.1–0.9)
NEUTROS ABS: 1.5 10*3/uL (ref 1.4–7.0)
NEUTROS PCT: 33 %
PLATELETS: 130 10*3/uL — AB (ref 150–379)
RBC: 4.06 x10E6/uL (ref 3.77–5.28)
RDW: 15.7 % — ABNORMAL HIGH (ref 12.3–15.4)
WBC: 4.7 10*3/uL (ref 3.4–10.8)

## 2016-11-08 LAB — VALPROIC ACID LEVEL: Valproic Acid Lvl: 102 ug/mL — ABNORMAL HIGH (ref 50–100)

## 2016-11-08 LAB — AMMONIA: Ammonia: 49 ug/dL (ref 19–87)

## 2016-11-12 ENCOUNTER — Telehealth: Payer: Self-pay | Admitting: *Deleted

## 2016-11-12 NOTE — Telephone Encounter (Signed)
-----   Message from Ward Givens, NP sent at 11/11/2016  2:11 PM EDT ----- Lab work is unremarkable. VPA has been elevated in the past. Will continue to monitor.

## 2016-11-12 NOTE — Telephone Encounter (Signed)
Called and spoke with Romie Minus (patient's mother) about lab results per MM,NP. She verbalized understanding and will let pt know.

## 2016-12-05 ENCOUNTER — Ambulatory Visit (INDEPENDENT_AMBULATORY_CARE_PROVIDER_SITE_OTHER): Payer: Medicare Other | Admitting: Internal Medicine

## 2016-12-09 ENCOUNTER — Telehealth: Payer: Self-pay | Admitting: Neurology

## 2016-12-09 DIAGNOSIS — R569 Unspecified convulsions: Secondary | ICD-10-CM

## 2016-12-09 MED ORDER — CLONAZEPAM 0.5 MG PO TABS: 0.5000 mg | ORAL_TABLET | Freq: Two times a day (BID) | ORAL | 1 refills | Status: DC

## 2016-12-09 NOTE — Telephone Encounter (Signed)
The prescription for clonazepam will be refilled.

## 2016-12-09 NOTE — Telephone Encounter (Signed)
Patient mother just called stating the patient needs a new prescription for Klonopin faxed to [facility].

## 2016-12-10 ENCOUNTER — Telehealth: Payer: Self-pay | Admitting: *Deleted

## 2016-12-10 NOTE — Telephone Encounter (Signed)
Faxed printed/signed rx clonazepam to pt pharmacy at [phone]. Received confirmation.

## 2017-01-02 DIAGNOSIS — Z1211 Encounter for screening for malignant neoplasm of colon: Secondary | ICD-10-CM | POA: Diagnosis not present

## 2017-01-06 ENCOUNTER — Other Ambulatory Visit: Payer: Self-pay | Admitting: *Deleted

## 2017-01-06 DIAGNOSIS — R569 Unspecified convulsions: Secondary | ICD-10-CM

## 2017-01-06 MED ORDER — LACOSAMIDE 200 MG PO TABS: 200.0000 mg | ORAL_TABLET | Freq: Two times a day (BID) | ORAL | 1 refills | Status: DC

## 2017-01-06 NOTE — Progress Notes (Signed)
[facility]. lacosamide [facility] [phone]

## 2017-01-28 ENCOUNTER — Other Ambulatory Visit (INDEPENDENT_AMBULATORY_CARE_PROVIDER_SITE_OTHER): Payer: Self-pay | Admitting: Internal Medicine

## 2017-01-28 DIAGNOSIS — K219 Gastro-esophageal reflux disease without esophagitis: Secondary | ICD-10-CM

## 2017-02-19 DIAGNOSIS — Z6823 Body mass index (BMI) 23.0-23.9, adult: Secondary | ICD-10-CM | POA: Diagnosis not present

## 2017-02-19 DIAGNOSIS — Z124 Encounter for screening for malignant neoplasm of cervix: Secondary | ICD-10-CM | POA: Diagnosis not present

## 2017-02-26 DIAGNOSIS — Z23 Encounter for immunization: Secondary | ICD-10-CM | POA: Diagnosis not present

## 2017-02-26 DIAGNOSIS — R05 Cough: Secondary | ICD-10-CM | POA: Diagnosis not present

## 2017-02-26 DIAGNOSIS — H6593 Unspecified nonsuppurative otitis media, bilateral: Secondary | ICD-10-CM | POA: Diagnosis not present

## 2017-02-26 DIAGNOSIS — Z6841 Body Mass Index (BMI) 40.0 and over, adult: Secondary | ICD-10-CM | POA: Diagnosis not present

## 2017-02-26 DIAGNOSIS — Z1389 Encounter for screening for other disorder: Secondary | ICD-10-CM | POA: Diagnosis not present

## 2017-03-01 ENCOUNTER — Encounter (HOSPITAL_COMMUNITY): Payer: Self-pay | Admitting: *Deleted

## 2017-03-01 ENCOUNTER — Emergency Department (HOSPITAL_COMMUNITY)
Admission: EM | Admit: 2017-03-01 | Discharge: 2017-03-01 | Disposition: A | Discharge status: Home / Self Care | Payer: Medicare Other | Attending: Emergency Medicine | Admitting: Emergency Medicine

## 2017-03-01 DIAGNOSIS — Z79899 Other long term (current) drug therapy: Secondary | ICD-10-CM | POA: Diagnosis not present

## 2017-03-01 DIAGNOSIS — H9201 Otalgia, right ear: Secondary | ICD-10-CM | POA: Diagnosis present

## 2017-03-01 DIAGNOSIS — H6121 Impacted cerumen, right ear: Secondary | ICD-10-CM | POA: Diagnosis not present

## 2017-03-01 DIAGNOSIS — Z87891 Personal history of nicotine dependence: Secondary | ICD-10-CM | POA: Diagnosis not present

## 2017-03-01 NOTE — ED Triage Notes (Signed)
Pt states started having right ear ache yesterday. Not able to make it through night due to pain.

## 2017-03-01 NOTE — ED Provider Notes (Signed)
[facility] DEPT Provider Note   CSN: [id number] Arrival date & time: 03/01/17  0011     History   Chief Complaint Chief Complaint  Patient presents with  . Otalgia    HPI Maria [name] is a 56 y.o. female presenting with a one day history of right ear pain and painful sore throat (only triggered with swallowing). She has had no fevers, chills, cough, nasal congestion, ear drainage or trauma.  The history is provided by the patient and a parent.    Past Medical History:  Diagnosis Date  . Constipation   . Dysphagia   . Fracture    R foot  . GERD (gastroesophageal reflux disease)   . History of shingles 10/2016  . Mental retardation    lesion in head  . Seizures ([place])    "not fully controlled on max doses of meds" (Neuro ofc note 12/2014)  . Thrombocytopenia ([place])   . Tremor     Patient Active Problem List   Diagnosis Date Noted  . Posterior tibial tendinitis, right leg 04/23/2016  . Pain in right foot 04/12/2016  . Acute encephalopathy   . Myoclonus   . Frequent falls 11/09/2015  . Fall 11/09/2015  . Chest pain 03/16/2014  . Upper abdominal pain 03/16/2014  . Seizure disorder ([place]) 03/16/2014  . 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 Generalized convulsive epilepsy ([place]) 10/06/2012  . Encounter for therapeutic drug monitoring 10/06/2012  . Mental retardation   . Thrombocytopenia ([place]) 05/23/2011  . Seizures ([place]) 03/21/2011  . GERD (gastroesophageal reflux disease) 03/21/2011  . CLOSED FRACTURE OF ACROMIAL END OF CLAVICLE 07/13/2008    Past Surgical History:  Procedure Laterality Date  . BIOPSY  09/16/2014   Procedure: BIOPSY;  Surgeon: Rogene Houston, MD;  Location: [facility];  Service: Endoscopy;;  . CATARACT EXTRACTION     both eyes, May of 2015  . ESOPHAGEAL DILATION N/A 09/16/2014   Procedure: ESOPHAGEAL DILATION WITH 54FR MALONEY DILATOR;  Surgeon: Rogene Houston, MD;  Location: [facility];  Service: Endoscopy;  Laterality: N/A;  .  ESOPHAGOGASTRODUODENOSCOPY (EGD) WITH PROPOFOL N/A 09/16/2014   Procedure: ESOPHAGOGASTRODUODENOSCOPY (EGD) WITH PROPOFOL;  Surgeon: Rogene Houston, MD;  Location: [facility];  Service: Endoscopy;  Laterality: N/A;  . MOUTH SURGERY    . Skin graft to gum Right 08/2013  . TONSILLECTOMY AND ADENOIDECTOMY    . TOTAL ABDOMINAL HYSTERECTOMY      OB History    No data available       Home Medications    Prior to Admission medications   Medication Sig Start Date End Date Taking? Authorizing Provider  Calcium Carb-Cholecalciferol (CALCIUM 500 +D) 500-400 MG-UNIT TABS Take 1 tablet by mouth 2 (two) times daily.   Yes [provider]  Calcium Carbonate Antacid (TUMS PO) Take 1 tablet by mouth daily as needed (heartburn).    Yes [provider]  cholecalciferol (VITAMIN D) 1000 UNITS tablet Take 1,000 Units by mouth daily. Reported on 05/30/2015   Yes [provider]  clonazePAM (KLONOPIN) 0.5 MG tablet Take 1 tablet (0.5 mg total) by mouth 2 (two) times daily. 12/09/16  Yes Kathrynn Ducking, MD  divalproex (DEPAKOTE ER) 500 MG 24 hr tablet Take 1 tablet (500 mg total) by mouth 2 (two) times daily. 07/23/16  Yes Ward Givens, NP  docusate sodium (COLACE) 100 MG capsule Take 100 mg by mouth daily as needed.    Yes [provider]  estradiol (VIVELLE-DOT) 0.075 MG/24HR  Place 1 patch onto the skin 2 (two) times a week. On Wednesday and Saturday   Yes [provider]  feeding supplement, ENSURE ENLIVE, (ENSURE ENLIVE) LIQD Take 237 mLs by mouth 2 (two) times daily between meals. 11/13/15  Yes Robbie Lis, MD  ibuprofen (ADVIL,MOTRIN) 200 MG tablet Take 200 mg by mouth every 6 (six) hours as needed.   Yes [provider]  lacosamide (VIMPAT) 200 MG TABS tablet Take 1 tablet (200 mg total) by mouth 2 (two) times daily. 01/06/17  Yes Ward Givens, NP  levocetirizine (XYZAL) 5 MG tablet Take 5 mg by mouth every evening.    Yes [provider]    omeprazole (PRILOSEC) 40 MG capsule TAKE 1 CAPSULE DAILY 01/28/17  Yes Setzer, Terri L, NP  oxybutynin (DITROPAN-XL) 5 MG 24 hr tablet Take 5 mg by mouth daily.   Yes [provider]  Pediatric Multiple Vit-C-FA (PEDIATRIC MULTIVITAMIN) chewable tablet Chew 1 tablet by mouth daily.     Yes [provider]    Family History Family History  Problem Relation Age of Onset  . High Cholesterol Mother   . High blood pressure Mother   . Diabetes Father     Social History Social History  Substance Use Topics  . Smoking status: Former Smoker    Packs/day: 1.50    Years: 15.00    Types: Cigarettes    Quit date: 05/22/2006  . Smokeless tobacco: Never Used  . Alcohol use No     Allergies   Codeine and Lamictal [lamotrigine]   Review of Systems Review of Systems  Constitutional: Negative for chills and fever.  HENT: Positive for ear pain and sore throat. Negative for congestion, hearing loss, rhinorrhea, sinus pressure, trouble swallowing and voice change.   Eyes: Negative for discharge.  Respiratory: Negative for cough, shortness of breath, wheezing and stridor.   Cardiovascular: Negative for chest pain.  Gastrointestinal: Negative for abdominal pain.  Genitourinary: Negative.      Physical Exam Updated Vital Signs BP 134/64 (BP Location: Right Arm)   Pulse 64   Temp 98.7 F (37.1 C) (Oral)   Resp 15   Ht 5' 4.5" (1.638 m)   Wt 66.2 kg (146 lb)   SpO2 98%   BMI 24.67 kg/m   Physical Exam  Constitutional: She is oriented to person, place, and time. She appears well-developed and well-nourished.  HENT:  Head: Normocephalic and atraumatic.  Right Ear: Tympanic membrane and ear canal normal.  Left Ear: Tympanic membrane and ear canal normal.  Nose: No mucosal edema or rhinorrhea.  Mouth/Throat: Uvula is midline, oropharynx is clear and moist and mucous membranes are normal. No oropharyngeal exudate, posterior oropharyngeal edema, posterior oropharyngeal  erythema or tonsillar abscesses. Tonsils are 0 on the right. Tonsils are 0 on the left.  Right cerumen impaction  Eyes: Conjunctivae are normal.  Cardiovascular: Normal rate and normal heart sounds.   Pulmonary/Chest: Effort normal. No respiratory distress. She has no wheezes. She has no rales.  Abdominal: Soft. There is no tenderness.  Musculoskeletal: Normal range of motion.  Neurological: She is alert and oriented to person, place, and time.  Skin: Skin is warm and dry. No rash noted.  Psychiatric: She has a normal mood and affect.     ED Treatments / Results  Labs (all labs ordered are listed, but only abnormal results are displayed) Labs Reviewed - No data to display  EKG  EKG Interpretation None  Radiology No results found.  Procedures .Ear Cerumen Removal Date/Time: 03/01/2017 1:03 AM Performed by: Evalee Jefferson Authorized by: Evalee Jefferson   Consent:    Consent obtained:  Verbal   Consent given by:  Patient and parent   Risks discussed:  Pain, TM perforation, incomplete removal and dizziness   Alternatives discussed:  No treatment Procedure details:    Location:  R ear Post-procedure details:    Inspection:  TM intact   Hearing quality:  Improved   Patient tolerance of procedure:  Tolerated well, no immediate complications   (including critical care time)  Medications Ordered in ED Medications - No data to display   Initial Impression / Assessment and Plan / ED Course  I have reviewed the triage vital signs and the nursing notes.  Pertinent labs & imaging results that were available during my care of the patient were reviewed by me and considered in my medical decision making (see chart for details).     Prn f/u anticipated.  Final Clinical Impressions(s) / ED Diagnoses   Final diagnoses:  Impacted cerumen of right ear    New Prescriptions New Prescriptions   No medications on file     Landis Martins 03/01/17 Regan Lemming,  MD 03/01/17 [phone]

## 2017-03-01 NOTE — ED Notes (Signed)
Ear presoaked before flushing.

## 2017-04-10 ENCOUNTER — Ambulatory Visit (INDEPENDENT_AMBULATORY_CARE_PROVIDER_SITE_OTHER): Payer: Medicare Other | Admitting: Orthopaedic Surgery

## 2017-04-10 ENCOUNTER — Ambulatory Visit (INDEPENDENT_AMBULATORY_CARE_PROVIDER_SITE_OTHER): Payer: Medicare Other

## 2017-04-10 ENCOUNTER — Encounter (INDEPENDENT_AMBULATORY_CARE_PROVIDER_SITE_OTHER): Payer: Self-pay | Admitting: Orthopaedic Surgery

## 2017-04-10 DIAGNOSIS — M79672 Pain in left foot: Secondary | ICD-10-CM

## 2017-04-10 DIAGNOSIS — S93492A Sprain of other ligament of left ankle, initial encounter: Secondary | ICD-10-CM | POA: Diagnosis not present

## 2017-04-10 NOTE — [facility] Notes (Signed)
Office Visit Note   Patient: Maria [name]           Date of Birth: 1961/04/22           MRN: [id number] Visit Date: 04/10/2017              Requested by: Redmond School, [address] [place], [place] [address] PCP: Redmond School, MD   Assessment & Plan: Visit Diagnoses:  1. Pain in left foot     Plan: Ice, elevation, careful weightbearing as tolerated.  She has a cam boot at home that she could use if needed.  Follow-up as needed.  Follow-Up Instructions: No Follow-up on file.   Orders:  Orders Placed This Encounter  Procedures  . XR Foot Complete Left   No orders of the defined types were placed in this encounter.     Procedures: No procedures performed   Clinical Data: No additional findings.   Subjective: Chief Complaint  Patient presents with  . Left Foot - Pain    HPI   56-year-old female fell on Sunday injuring her left ankle.  There is concerned that she might have a fracture with her past history of falls in the past with other fractures.  She has lateral ankle swelling with lateral ankle ecchymosis.  Skin is intact.  Review of Systems is treatment for right ankle posterior tibial tendinitis.  Previous bimalleolar ankle fracture treated with cast immobilization in a cam boot.  She enjoys doing coloring books.  History of epilepsy.  For mental retardation.   Objective: Vital Signs: There were no vitals taken for this visit.  Physical Exam  Constitutional: She is oriented to person, place, and time. She appears well-developed.  HENT:  Head: Normocephalic.  Right Ear: External ear normal.  Left Ear: External ear normal.  Eyes: Pupils are equal, round, and reactive to light.  Neck: No tracheal deviation present. No thyromegaly present.  Cardiovascular: Normal rate.  Pulmonary/Chest: Effort normal.  Abdominal: Soft.  Neurological: She is alert and oriented to person, place, and time.  Skin: Skin is warm and dry. Capillary refill takes  less than 2 seconds.  Psychiatric:  Alert.pleasant    Ortho Exam lateral ankle ecchymosis on the left tenderness of the anterior talofibular negative anterior drawer.  Foot x-rays 3 views are negative for fracture.  Tenderness over the base the fifth metatarsal. Specialty Comments:  No specialty comments available.  Imaging: No results found.   PMFS History: Patient Active Problem List   Diagnosis Date Noted  . Posterior tibial tendinitis, right leg 04/23/2016  . Pain in right foot 04/12/2016  . Acute encephalopathy   . Myoclonus   . Frequent falls 11/09/2015  . Fall 11/09/2015  . Chest pain 03/16/2014  . Upper abdominal pain 03/16/2014  . Seizure disorder ([employer]) 03/16/2014  . 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 Generalized convulsive epilepsy ([place]) 10/06/2012  . Encounter for therapeutic drug monitoring 10/06/2012  . Mental retardation   . Thrombocytopenia ([place]) 05/23/2011  . Seizures ([place]) 03/21/2011  . GERD (gastroesophageal reflux disease) 03/21/2011  . CLOSED FRACTURE OF ACROMIAL END OF CLAVICLE 07/13/2008   Past Medical History:  Diagnosis Date  . Constipation   . Dysphagia   . Fracture    R foot  . GERD (gastroesophageal reflux disease)   . History of shingles 10/2016  . Mental retardation    lesion in head  . Seizures ([place])    "not fully controlled on max doses of meds" (  Neuro ofc note 12/2014)  . Thrombocytopenia ([place])   . Tremor     Family History  Problem Relation Age of Onset  . High Cholesterol Mother   . High blood pressure Mother   . Diabetes Father     Past Surgical History:  Procedure Laterality Date  . CATARACT EXTRACTION     both eyes, May of 2015  . MOUTH SURGERY    . Skin graft to gum Right 08/2013  . TONSILLECTOMY AND ADENOIDECTOMY    . TOTAL ABDOMINAL HYSTERECTOMY     Social History   Occupational History    Employer: UNEMPLOYED  Tobacco Use  . Smoking status: Former Smoker    Packs/day: 1.50    Years: 15.00    Pack  years: 22.50    Types: Cigarettes    Last attempt to quit: 05/22/2006    Years since quitting: 10.8  . Smokeless tobacco: Never Used  Substance and Sexual Activity  . Alcohol use: No    Alcohol/week: 0.0 oz  . Drug use: No  . Sexual activity: No    Birth control/protection: Other-see comments    Comment: Hysterectomy

## 2017-04-16 ENCOUNTER — Other Ambulatory Visit (HOSPITAL_COMMUNITY): Payer: Self-pay | Admitting: *Deleted

## 2017-04-16 DIAGNOSIS — D696 Thrombocytopenia, unspecified: Secondary | ICD-10-CM

## 2017-04-16 NOTE — [facility] Notes (Deleted)
[address] [place], [place] [address]   CLINIC:  Medical Oncology/Hematology  PCP:  Redmond School, [address] [place] [address] [phone]   REASON FOR VISIT:  Follow-up for Thrombocytopenia   CURRENT THERAPY: Observation     HISTORY OF PRESENT ILLNESS:  (From Kirby Crigler, PA-C's last note on 04/09/16)      INTERVAL HISTORY:  Ms. Dura 56 y.o. female returns for routine follow-up for thrombocytopenia.   ***   REVIEW OF SYSTEMS:  Review of Systems - Oncology   PAST MEDICAL/SURGICAL HISTORY:  Past Medical History:  Diagnosis Date  . Constipation   . Dysphagia   . Fracture    R foot  . GERD (gastroesophageal reflux disease)   . History of shingles 10/2016  . Mental retardation    lesion in head  . Seizures ([place])    "not fully controlled on max doses of meds" (Neuro ofc note 12/2014)  . Thrombocytopenia ([place])   . Tremor    Past Surgical History:  Procedure Laterality Date  . CATARACT EXTRACTION     both eyes, May of 2015  . MOUTH SURGERY    . Skin graft to gum Right 08/2013  . TONSILLECTOMY AND ADENOIDECTOMY    . TOTAL ABDOMINAL HYSTERECTOMY       SOCIAL HISTORY:  Social History   Socioeconomic History  . Marital status: Single    Spouse name: Not on file  . Number of children: 0  . Years of education: 10  . Highest education level: Not on file  Social Needs  . Financial resource strain: Not on file  . Food insecurity - worry: Not on file  . Food insecurity - inability: Not on file  . Transportation needs - medical: Not on file  . Transportation needs - non-medical: Not on file  Occupational History    Employer: UNEMPLOYED  Tobacco Use  . Smoking status: Former Smoker    Packs/day: 1.50    Years: 15.00    Pack years: 22.50    Types: Cigarettes    Last attempt to quit: 05/22/2006    Years since quitting: 10.9  . Smokeless tobacco: Never Used  Substance and Sexual Activity  . Alcohol  use: No    Alcohol/week: 0.0 oz  . Drug use: No  . Sexual activity: No    Birth control/protection: Other-see comments    Comment: Hysterectomy  Other Topics Concern  . Not on file  Social History Narrative   Patient is single and lives with her mother [name] patient. Advised patient of provider's approval for requested procedure, as well as any comments/instructions from provider.       Provided patient w/ verbal instructions concerning pre-, intra- and post-procedure preparation and instructions.      Patient verbalized understanding of the above.       Patient has also been mailed a letter containing the following instructions :{AMB GI UXNA:35573}      Patient drinks 3 cups of caffeine daily.   Patient is right handed.          FAMILY HISTORY:  Family History  Problem Relation Age of Onset  . High Cholesterol Mother   . High blood pressure Mother   . Diabetes Father     CURRENT MEDICATIONS:  Outpatient Encounter Medications as of 04/17/2017  Medication Sig Note  . Calcium Carb-Cholecalciferol (CALCIUM 500 +D) 500-400 MG-UNIT TABS Take 1 tablet by mouth 2 (two) times daily.   . Calcium  Carbonate Antacid (TUMS PO) Take 1 tablet by mouth daily as needed (heartburn).    . cholecalciferol (VITAMIN D) 1000 UNITS tablet Take 1,000 Units by mouth daily. Reported on 05/30/2015   . clonazePAM (KLONOPIN) 0.5 MG tablet Take 1 tablet (0.5 mg total) by mouth 2 (two) times daily. 12/10/2016: Faxed printed/signed rx clonazepam to pt pharmacy at [phone]. Received confirmation.   . divalproex (DEPAKOTE ER) 500 MG 24 hr tablet Take 1 tablet (500 mg total) by mouth 2 (two) times daily.   Marland Kitchen docusate sodium (COLACE) 100 MG capsule Take 100 mg by mouth daily as needed.    Marland Kitchen estradiol (VIVELLE-DOT) 0.075 MG/24HR Place 1 patch onto the skin 2 (two) times a week. On Wednesday and Saturday   . feeding supplement, ENSURE ENLIVE, (ENSURE ENLIVE) LIQD Take 237 mLs by mouth 2 (two) times daily  between meals.   Marland Kitchen ibuprofen (ADVIL,MOTRIN) 200 MG tablet Take 200 mg by mouth every 6 (six) hours as needed.   . lacosamide (VIMPAT) 200 MG TABS tablet Take 1 tablet (200 mg total) by mouth 2 (two) times daily.   Marland Kitchen levocetirizine (XYZAL) 5 MG tablet Take 5 mg by mouth every evening.    Marland Kitchen omeprazole (PRILOSEC) 40 MG capsule TAKE 1 CAPSULE DAILY   . oxybutynin (DITROPAN-XL) 5 MG 24 hr tablet Take 5 mg by mouth daily.   . Pediatric Multiple Vit-C-FA (PEDIATRIC MULTIVITAMIN) chewable tablet Chew 1 tablet by mouth daily.      No facility-administered encounter medications on file as of 04/17/2017.     ALLERGIES:  Allergies  Allergen Reactions  . Codeine Nausea And Vomiting  . Lamictal [Lamotrigine] Rash     PHYSICAL EXAM:  ECOG Performance status: ***  There were no vitals filed for this visit. There were no vitals filed for this visit.  Physical Exam   LABORATORY DATA:  I have reviewed the labs as listed.  CBC    Component Value Date/Time   WBC 4.7 11/05/2016 1104   WBC 6.9 04/09/2016 1409   RBC 4.06 11/05/2016 1104   RBC 3.92 04/09/2016 1409   HGB 13.0 11/05/2016 1104   HCT 38.2 11/05/2016 1104   PLT 130 (L) 11/05/2016 1104   MCV 94 11/05/2016 1104   MCH 32.0 11/05/2016 1104   MCH 31.6 04/09/2016 1409   MCHC 34.0 11/05/2016 1104   MCHC 33.0 04/09/2016 1409   RDW 15.7 (H) 11/05/2016 1104   LYMPHSABS 2.6 11/05/2016 1104   MONOABS 0.5 04/09/2016 1409   EOSABS 0.1 11/05/2016 1104   BASOSABS 0.0 11/05/2016 1104   CMP Latest Ref Rng & Units 11/05/2016 04/29/2016 11/09/2015  Glucose 65 - 99 mg/dL 86 95 96  BUN 6 - 24 mg/dL 25(H) 24 17  Creatinine 0.57 - 1.00 mg/dL 0.79 0.71 0.84  Sodium 134 - 144 mmol/L 143 141 138  Potassium 3.5 - 5.2 mmol/L 4.4 4.2 3.9  Chloride 96 - 106 mmol/L 103 99 105  CO2 18 - 29 mmol/L 24 24 27   Calcium 8.7 - 10.2 mg/dL 9.8 9.4 9.8  Total Protein 6.0 - 8.5 g/dL 6.8 6.7 -  Total Bilirubin 0.0 - 1.2 mg/dL 0.3 0.3 -  Alkaline Phos 39 - 117 IU/L  60 81 -  AST 0 - 40 IU/L 20 18 -  ALT 0 - 32 IU/L 17 19 -    PENDING LABS:    DIAGNOSTIC IMAGING:    PATHOLOGY:     ASSESSMENT & PLAN:   Thrombocytopenia:  -Thought to be low-grade/very  stable ITP in the setting of low-medium IgG anticardiolipin antibody and negative platelet antibodies vs drug-induced from Depakote for seizure disorder.   -Platelets have historically been >100,000 for several years.  Plt count today ***.   -Can continue annual observation with labs and follow-up visit vs being released from follow-up here at the cancer center and allowing her PCP to monitor her blood counts going forward. Both options were presented to her today and she elects ***.        Dispo:  -Return to cancer center in 1 year vs release from follow-up and have PCP monitor blood counts moving forward. ***   All questions were answered to patient's stated satisfaction. Encouraged patient to call with any new concerns or questions before her next visit to the cancer center and we can certain see her sooner, if needed.    Plan of care discussed with Dr. Talbert Cage, who agrees with the above aforementioned.    Orders placed this encounter:  No orders of the defined types were placed in this encounter.     Mike Craze, NP [place] [phone]

## 2017-04-17 ENCOUNTER — Ambulatory Visit (HOSPITAL_COMMUNITY): Payer: Medicare Other | Admitting: Adult Health

## 2017-04-17 ENCOUNTER — Other Ambulatory Visit (HOSPITAL_COMMUNITY): Payer: Medicare Other

## 2017-04-28 DIAGNOSIS — H524 Presbyopia: Secondary | ICD-10-CM | POA: Diagnosis not present

## 2017-04-28 DIAGNOSIS — H26493 Other secondary cataract, bilateral: Secondary | ICD-10-CM | POA: Diagnosis not present

## 2017-04-28 DIAGNOSIS — H5213 Myopia, bilateral: Secondary | ICD-10-CM | POA: Diagnosis not present

## 2017-04-30 DIAGNOSIS — Z1389 Encounter for screening for other disorder: Secondary | ICD-10-CM | POA: Diagnosis not present

## 2017-04-30 DIAGNOSIS — J029 Acute pharyngitis, unspecified: Secondary | ICD-10-CM | POA: Diagnosis not present

## 2017-04-30 DIAGNOSIS — Z681 Body mass index (BMI) 19 or less, adult: Secondary | ICD-10-CM | POA: Diagnosis not present

## 2017-05-01 ENCOUNTER — Telehealth: Payer: Self-pay | Admitting: Adult Health

## 2017-05-01 ENCOUNTER — Other Ambulatory Visit: Payer: Self-pay

## 2017-05-01 ENCOUNTER — Emergency Department (HOSPITAL_COMMUNITY)
Admission: EM | Admit: 2017-05-01 | Discharge: 2017-05-01 | Disposition: A | Discharge status: Home / Self Care | Payer: Medicare Other | Attending: Emergency Medicine | Admitting: Emergency Medicine

## 2017-05-01 ENCOUNTER — Encounter (HOSPITAL_COMMUNITY): Payer: Self-pay | Admitting: Emergency Medicine

## 2017-05-01 DIAGNOSIS — Z79899 Other long term (current) drug therapy: Secondary | ICD-10-CM | POA: Diagnosis not present

## 2017-05-01 DIAGNOSIS — R112 Nausea with vomiting, unspecified: Secondary | ICD-10-CM | POA: Diagnosis not present

## 2017-05-01 DIAGNOSIS — Z87891 Personal history of nicotine dependence: Secondary | ICD-10-CM | POA: Diagnosis not present

## 2017-05-01 DIAGNOSIS — R569 Unspecified convulsions: Secondary | ICD-10-CM

## 2017-05-01 LAB — COMPREHENSIVE METABOLIC PANEL
ALK PHOS: 79 U/L (ref 38–126)
ALT: 23 U/L (ref 14–54)
AST: 24 U/L (ref 15–41)
Albumin: 4.1 g/dL (ref 3.5–5.0)
Anion gap: 8 (ref 5–15)
BUN: 28 mg/dL — AB (ref 6–20)
CO2: 27 mmol/L (ref 22–32)
CREATININE: 0.86 mg/dL (ref 0.44–1.00)
Calcium: 9.2 mg/dL (ref 8.9–10.3)
Chloride: 101 mmol/L (ref 101–111)
GFR calc Af Amer: >60 mL/min (ref >60)
GLUCOSE: 126 mg/dL — AB (ref 65–99)
POTASSIUM: 3.8 mmol/L (ref 3.5–5.1)
Sodium: 136 mmol/L (ref 135–145)
TOTAL PROTEIN: 7.5 g/dL (ref 6.5–8.1)
Total Bilirubin: 0.7 mg/dL (ref 0.3–1.2)

## 2017-05-01 LAB — URINALYSIS, ROUTINE W REFLEX MICROSCOPIC
Bilirubin Urine: NEGATIVE
Glucose, UA: NEGATIVE mg/dL
Hgb urine dipstick: NEGATIVE
Ketones, ur: 5 mg/dL — AB
Nitrite: NEGATIVE
PH: 7 (ref 5.0–8.0)
Protein, ur: NEGATIVE mg/dL
SPECIFIC GRAVITY, URINE: 1.015 (ref 1.005–1.030)

## 2017-05-01 LAB — CBC WITH DIFFERENTIAL/PLATELET
Basophils Absolute: 0 10*3/uL (ref 0.0–0.1)
Basophils Relative: 0 %
EOS PCT: 1 %
Eosinophils Absolute: 0.1 10*3/uL (ref 0.0–0.7)
HEMATOCRIT: 39.2 % (ref 36.0–46.0)
Hemoglobin: 12.6 g/dL (ref 12.0–15.0)
Lymphocytes Relative: 18 %
Lymphs Abs: 1.8 10*3/uL (ref 0.7–4.0)
MCH: 31 pg (ref 26.0–34.0)
MCHC: 32.1 g/dL (ref 30.0–36.0)
MCV: 96.3 fL (ref 78.0–100.0)
MONO ABS: 1.1 10*3/uL — AB (ref 0.1–1.0)
MONOS PCT: 12 %
NEUTROS ABS: 6.6 10*3/uL (ref 1.7–7.7)
Neutrophils Relative %: 69 %
PLATELETS: 124 10*3/uL — AB (ref 150–400)
RBC: 4.07 MIL/uL (ref 3.87–5.11)
RDW: 13.7 % (ref 11.5–15.5)
WBC: 9.6 10*3/uL (ref 4.0–10.5)

## 2017-05-01 LAB — LIPASE, BLOOD: LIPASE: 31 U/L (ref 11–51)

## 2017-05-01 MED ORDER — ONDANSETRON HCL 4 MG/2ML IJ SOLN
4.0000 mg | Freq: Once | INTRAMUSCULAR | Status: AC
  Administered 2017-05-01: 4 mg via INTRAVENOUS
  Filled 2017-05-01: qty 2

## 2017-05-01 MED ORDER — SODIUM CHLORIDE 0.9 % IV BOLUS (SEPSIS)
1000.0000 mL | Freq: Once | INTRAVENOUS | Status: AC
  Administered 2017-05-01: 1000 mL via INTRAVENOUS

## 2017-05-01 MED ORDER — CLONAZEPAM 0.5 MG PO TABS: 0.5000 mg | ORAL_TABLET | Freq: Two times a day (BID) | ORAL | 5 refills | Status: DC

## 2017-05-01 MED ORDER — ONDANSETRON HCL 4 MG PO TABS: 4.0000 mg | ORAL_TABLET | Freq: Three times a day (TID) | ORAL | 0 refills | Status: DC | PRN

## 2017-05-01 NOTE — ED Notes (Signed)
Patient did not received full bag of second bolus, due to mother did not want her getting up excessively to use bathroom, when she is discharged.

## 2017-05-01 NOTE — Discharge Instructions (Signed)
Drink plenty of fluids (clear liquids) then start a bland diet later this morning such as toast, crackers, jello, [employer] chicken noodle soup. Use the zofran for nausea or vomiting. Recheck if you are unable to control the pain or vomiting.

## 2017-05-01 NOTE — ED Triage Notes (Signed)
Pt has been vomiting and c/o abd pain.

## 2017-05-01 NOTE — ED Provider Notes (Signed)
[facility] EMERGENCY DEPARTMENT Provider Note   CSN: [id number] Arrival date & time: 05/01/17  0013  Time seen 01:10 AM   History   Chief Complaint Chief Complaint  Patient presents with  . Emesis   Level 5 caveat for mental retardation  HPI Maria [name] is a 56 y.o. female.  HPI patient's family states about 5 PM they ate at a [place] and the patient was the only one who ate mussels.  About 10 PM she woke up with nausea and vomiting and has had about 3-4 episodes of vomiting.  She complains of some upper abdominal pain or diffuse abdominal pain that comes and goes.  She denies bloating.  She denies diarrhea.  Mother tried to give her a nausea pill at home and she immediately vomited it back up.  Nobody else is sick at home.  PCP Redmond School, MD   Past Medical History:  Diagnosis Date  . Constipation   . Dysphagia   . Fracture    R foot  . GERD (gastroesophageal reflux disease)   . History of shingles 10/2016  . Mental retardation    lesion in head  . Seizures ([place])    "not fully controlled on max doses of meds" (Neuro ofc note 12/2014)  . Thrombocytopenia ([place])   . Tremor     Patient Active Problem List   Diagnosis Date Noted  . Posterior tibial tendinitis, right leg 04/23/2016  . Pain in right foot 04/12/2016  . Acute encephalopathy   . Myoclonus   . Frequent falls 11/09/2015  . Fall 11/09/2015  . Chest pain 03/16/2014  . Upper abdominal pain 03/16/2014  . Seizure disorder ([place]) 03/16/2014  . 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 Generalized convulsive epilepsy ([place]) 10/06/2012  . Encounter for therapeutic drug monitoring 10/06/2012  . Mental retardation   . Thrombocytopenia ([place]) 05/23/2011  . Seizures ([place]) 03/21/2011  . GERD (gastroesophageal reflux disease) 03/21/2011  . CLOSED FRACTURE OF ACROMIAL END OF CLAVICLE 07/13/2008    Past Surgical History:  Procedure Laterality Date  . BIOPSY  09/16/2014   Procedure: BIOPSY;   Surgeon: Rogene Houston, MD;  Location: [facility];  Service: Endoscopy;;  . CATARACT EXTRACTION     both eyes, May of 2015  . ESOPHAGEAL DILATION N/A 09/16/2014   Procedure: ESOPHAGEAL DILATION WITH 54FR MALONEY DILATOR;  Surgeon: Rogene Houston, MD;  Location: [facility];  Service: Endoscopy;  Laterality: N/A;  . ESOPHAGOGASTRODUODENOSCOPY (EGD) WITH PROPOFOL N/A 09/16/2014   Procedure: ESOPHAGOGASTRODUODENOSCOPY (EGD) WITH PROPOFOL;  Surgeon: Rogene Houston, MD;  Location: [facility];  Service: Endoscopy;  Laterality: N/A;  . MOUTH SURGERY    . Skin graft to gum Right 08/2013  . TONSILLECTOMY AND ADENOIDECTOMY    . TOTAL ABDOMINAL HYSTERECTOMY      OB History    No data available       Home Medications    Prior to Admission medications   Medication Sig Start Date End Date Taking? Authorizing Provider  Calcium Carb-Cholecalciferol (CALCIUM 500 +D) 500-400 MG-UNIT TABS Take 1 tablet by mouth 2 (two) times daily.   Yes [provider]  Calcium Carbonate Antacid (TUMS PO) Take 1 tablet by mouth daily as needed (heartburn).    Yes [provider]  cholecalciferol (VITAMIN D) 1000 UNITS tablet Take 1,000 Units by mouth daily. Reported on 05/30/2015   Yes [provider]  clonazePAM (KLONOPIN) 0.5 MG tablet Take 1 tablet (0.5 mg total) by mouth  2 (two) times daily. 12/09/16  Yes Kathrynn Ducking, MD  divalproex (DEPAKOTE ER) 500 MG 24 hr tablet Take 1 tablet (500 mg total) by mouth 2 (two) times daily. 07/23/16  Yes Ward Givens, NP  docusate sodium (COLACE) 100 MG capsule Take 100 mg by mouth daily as needed.    Yes [provider]  estradiol (VIVELLE-DOT) 0.075 MG/24HR Place 1 patch onto the skin 2 (two) times a week. On Wednesday and Saturday   Yes [provider]  feeding supplement, ENSURE ENLIVE, (ENSURE ENLIVE) LIQD Take 237 mLs by mouth 2 (two) times daily between meals. 11/13/15  Yes Robbie Lis, MD  ibuprofen (ADVIL,MOTRIN) 200 MG tablet Take  200 mg by mouth every 6 (six) hours as needed.   Yes [provider]  lacosamide (VIMPAT) 200 MG TABS tablet Take 1 tablet (200 mg total) by mouth 2 (two) times daily. 01/06/17  Yes Ward Givens, NP  levocetirizine (XYZAL) 5 MG tablet Take 5 mg by mouth every evening.    Yes [provider]  omeprazole (PRILOSEC) 40 MG capsule TAKE 1 CAPSULE DAILY 01/28/17  Yes Setzer, Terri L, NP  oxybutynin (DITROPAN-XL) 5 MG 24 hr tablet Take 5 mg by mouth daily.   Yes [provider]  Pediatric Multiple Vit-C-FA (PEDIATRIC MULTIVITAMIN) chewable tablet Chew 1 tablet by mouth daily.     Yes [provider]  ondansetron (ZOFRAN) 4 MG tablet Take 1 tablet (4 mg total) by mouth every 8 (eight) hours as needed. 05/01/17   Rolland Porter, MD    Family History Family History  Problem Relation Age of Onset  . High Cholesterol Mother   . High blood pressure Mother   . Diabetes Father     Social History Social History   Tobacco Use  . Smoking status: Former Smoker    Packs/day: 1.50    Years: 15.00    Pack years: 22.50    Types: Cigarettes    Last attempt to quit: 05/22/2006    Years since quitting: 10.9  . Smokeless tobacco: Never Used  Substance Use Topics  . Alcohol use: No    Alcohol/week: 0.0 oz  . Drug use: No  lives at home Lives with mother   Allergies   Codeine and Lamictal [lamotrigine]   Review of Systems Review of Systems  All other systems reviewed and are negative.    Physical Exam Updated Vital Signs BP (!) 116/59   Pulse 80   Temp (!) 97.5 F (36.4 C)   Resp 18   Ht 5\' 6"  (1.676 m)   Wt 66.2 kg (146 lb)   SpO2 100%   BMI 23.57 kg/m   Vital signs normal    Physical Exam  Constitutional: She is oriented to person, place, and time. She appears well-developed and well-nourished.  Non-toxic appearance. She does not appear ill. No distress.  HENT:  Head: Normocephalic and atraumatic.  Right Ear: External ear normal.  Left Ear:  External ear normal.  Nose: Nose normal. No mucosal edema or rhinorrhea.  Mouth/Throat: Oropharynx is clear and moist and mucous membranes are normal. No dental abscesses or uvula swelling.  Eyes: Conjunctivae and EOM are normal. Pupils are equal, round, and reactive to light.  Neck: Normal range of motion and full passive range of motion without pain. Neck supple.  Cardiovascular: Normal rate, regular rhythm and normal heart sounds. Exam reveals no gallop and no friction rub.  No murmur heard. Pulmonary/Chest: Effort normal and breath sounds normal.  No respiratory distress. She has no wheezes. She has no rhonchi. She has no rales. She exhibits no tenderness and no crepitus.  Abdominal: Soft. Normal appearance and bowel sounds are normal. She exhibits no distension. There is no tenderness. There is no rebound and no guarding.  Musculoskeletal: Normal range of motion. She exhibits no edema or tenderness.  Moves all extremities well.   Neurological: She is alert and oriented to person, place, and time. She has normal strength. No cranial nerve deficit.  Skin: Skin is warm, dry and intact. No rash noted. No erythema. No pallor.  Psychiatric: She has a normal mood and affect. Her speech is normal and behavior is normal. Her mood appears not anxious.  Nursing note and vitals reviewed.    ED Treatments / Results  Labs (all labs ordered are listed, but only abnormal results are displayed) Results for orders placed or performed during the hospital encounter of 05/01/17  Comprehensive metabolic panel  Result Value Ref Range   Sodium 136 135 - 145 mmol/L   Potassium 3.8 3.5 - 5.1 mmol/L   Chloride 101 101 - 111 mmol/L   CO2 27 22 - 32 mmol/L   Glucose, Bld 126 (H) 65 - 99 mg/dL   BUN 28 (H) 6 - 20 mg/dL   Creatinine, Ser 0.86 0.44 - 1.00 mg/dL   Calcium 9.2 8.9 - 10.3 mg/dL   Total Protein 7.5 6.5 - 8.1 g/dL   Albumin 4.1 3.5 - 5.0 g/dL   AST 24 15 - 41 U/L   ALT 23 14 - 54 U/L   Alkaline  Phosphatase 79 38 - 126 U/L   Total Bilirubin 0.7 0.3 - 1.2 mg/dL   GFR calc non Af Amer >60 >60 mL/min   GFR calc Af Amer >60 >60 mL/min   Anion gap 8 5 - 15  Lipase, blood  Result Value Ref Range   Lipase 31 11 - 51 U/L  CBC with Differential  Result Value Ref Range   WBC 9.6 4.0 - 10.5 K/uL   RBC 4.07 3.87 - 5.11 MIL/uL   Hemoglobin 12.6 12.0 - 15.0 g/dL   HCT 39.2 36.0 - 46.0 %   MCV 96.3 78.0 - 100.0 fL   MCH 31.0 26.0 - 34.0 pg   MCHC 32.1 30.0 - 36.0 g/dL   RDW 13.7 11.5 - 15.5 %   Platelets 124 (L) 150 - 400 K/uL   Neutrophils Relative % 69 %   Neutro Abs 6.6 1.7 - 7.7 K/uL   Lymphocytes Relative 18 %   Lymphs Abs 1.8 0.7 - 4.0 K/uL   Monocytes Relative 12 %   Monocytes Absolute 1.1 (H) 0.1 - 1.0 K/uL   Eosinophils Relative 1 %   Eosinophils Absolute 0.1 0.0 - 0.7 K/uL   Basophils Relative 0 %   Basophils Absolute 0.0 0.0 - 0.1 K/uL  Urinalysis, Routine w reflex microscopic  Result Value Ref Range   Color, Urine YELLOW YELLOW   APPearance CLEAR CLEAR   Specific Gravity, Urine 1.015 1.005 - 1.030   pH 7.0 5.0 - 8.0   Glucose, UA NEGATIVE NEGATIVE mg/dL   Hgb urine dipstick NEGATIVE NEGATIVE   Bilirubin Urine NEGATIVE NEGATIVE   Ketones, ur 5 (A) NEGATIVE mg/dL   Protein, ur NEGATIVE NEGATIVE mg/dL   Nitrite NEGATIVE NEGATIVE   Leukocytes, UA MODERATE (A) NEGATIVE   RBC / HPF 0-5 0 - 5 RBC/hpf   WBC, UA 0-5 0 - 5 WBC/hpf   Bacteria, UA RARE (  A) NONE SEEN   Squamous Epithelial / LPF 0-5 (A) NONE SEEN   Mucus PRESENT    Laboratory interpretation all normal except mild elevation of BUN consistent with dehydration    EKG  EKG Interpretation None       Radiology No results found.  Procedures Procedures (including critical care time)  Medications Ordered in ED Medications  sodium chloride 0.9 % bolus 1,000 mL (0 mLs Intravenous Stopped 05/01/17 0326)  sodium chloride 0.9 % bolus 1,000 mL (0 mLs Intravenous Stopped 05/01/17 0325)  ondansetron  (ZOFRAN) injection 4 mg (4 mg Intravenous Given 05/01/17 0143)     Initial Impression / Assessment and Plan / ED Course  I have reviewed the triage vital signs and the nursing notes.  Pertinent labs & imaging results that were available during my care of the patient were reviewed by me and considered in my medical decision making (see chart for details).    Patient was given IV fluids and IV nausea medication.  3 AM nurse informing family did not want her to initiate second liter of IV fluid because she is having good urinary output.  Patient was offered oral fluids to see if that made her feel worse.  Recheck at time of discharge patient has been able to drink without nausea or vomiting.  She states her nausea is gone and her abdominal pain is gone.  She feels ready to be discharged.  Final Clinical Impressions(s) / ED Diagnoses   Final diagnoses:  Non-intractable vomiting with nausea, unspecified vomiting type    ED Discharge Orders        Ordered    ondansetron (ZOFRAN) 4 MG tablet  Every 8 hours PRN     05/01/17 0348     Plan discharge  Rolland Porter, MD, Barbette Or, MD 05/01/17 [phone]

## 2017-05-01 NOTE — Telephone Encounter (Signed)
Pt's mother request refill for clonazePAM (KLONOPIN) 0.5 MG tablet sent to [facility].

## 2017-05-01 NOTE — Telephone Encounter (Signed)
RX for klonopin was faxed to [facility]. Received a receipt of confirmation.

## 2017-05-08 ENCOUNTER — Encounter (HOSPITAL_BASED_OUTPATIENT_CLINIC_OR_DEPARTMENT_OTHER): Payer: Medicare Other | Admitting: Adult Health

## 2017-05-08 ENCOUNTER — Encounter (HOSPITAL_COMMUNITY): Payer: Medicare Other | Attending: Adult Health

## 2017-05-08 ENCOUNTER — Encounter (HOSPITAL_COMMUNITY): Payer: Self-pay | Admitting: Adult Health

## 2017-05-08 ENCOUNTER — Other Ambulatory Visit: Payer: Self-pay

## 2017-05-08 VITALS — BP 122/59 | HR 63 | Temp 97.8°F | Resp 16 | Ht 65.0 in | Wt 137.4 lb

## 2017-05-08 DIAGNOSIS — R1310 Dysphagia, unspecified: Secondary | ICD-10-CM | POA: Diagnosis not present

## 2017-05-08 DIAGNOSIS — D696 Thrombocytopenia, unspecified: Secondary | ICD-10-CM

## 2017-05-08 LAB — CBC WITH DIFFERENTIAL/PLATELET
BASOS ABS: 0 10*3/uL (ref 0.0–0.1)
BASOS PCT: 0 %
Eosinophils Absolute: 0.3 10*3/uL (ref 0.0–0.7)
Eosinophils Relative: 3 %
HEMATOCRIT: 38.1 % (ref 36.0–46.0)
HEMOGLOBIN: 12.4 g/dL (ref 12.0–15.0)
LYMPHS PCT: 40 %
Lymphs Abs: 3.2 10*3/uL (ref 0.7–4.0)
MCH: 31.6 pg (ref 26.0–34.0)
MCHC: 32.5 g/dL (ref 30.0–36.0)
MCV: 97.2 fL (ref 78.0–100.0)
MONOS PCT: 9 %
Monocytes Absolute: 0.7 10*3/uL (ref 0.1–1.0)
NEUTROS PCT: 48 %
Neutro Abs: 3.8 10*3/uL (ref 1.7–7.7)
Platelets: 178 10*3/uL (ref 150–400)
RBC: 3.92 MIL/uL (ref 3.87–5.11)
RDW: 13.7 % (ref 11.5–15.5)
WBC: 8 10*3/uL (ref 4.0–10.5)

## 2017-05-08 NOTE — Progress Notes (Signed)
[address] [place], [place] [address]   CLINIC:  Medical Oncology/Hematology  PCP:  Redmond School, [place] [address] [phone]   REASON FOR VISIT:  Follow-up for Thrombocytopenia   CURRENT THERAPY: Observation     HISTORY OF PRESENT ILLNESS:  (From Kirby Crigler, PA-C's last note on 04/09/16)       INTERVAL HISTORY:  Maria [name] 56 y.o. female returns for routine follow-up for thrombocytopenia.   Here today with her mother.  Her mom answers some of the questions during the visit today. Patient herself is slow to respond at times and has some word-finding difficulty.   Overall, she tells me she has been feeling okay.  Appetite and energy levels both 50%. She had episode of N&V recently, which her mother thinks may have been food poisoning. Mother reports having to bring her to the ED on 05/01/17 for intractable N&V. She received IVF and IV anti-emetics; her condition improved and she was discharged home in stable condition.    No recurrent N&V since that time.  She shows me the bruise on her arm from IV during recent ED visit.  Shares with me that she does bruise easily, but denies any frank bleeding. Endorses mild nosebleed, which was scant amount of blood. Denies any blood in stools or hematuria.   She has chronic issues with dysphagia. Endorses recent tooth pain; she sees a Pharmacist, community in [place], [place] and had a crown placed ~1 month ago.  She had sore throat and fever 100.5 ~3 weeks ago. Symptoms resolved on their own without additional intervention per patient's mother.  Patient occasionally takes Ibuprofen for pain.    Otherwise, she is largely without other complaints today. Ms. Dinunzio is not sure why she is here today.    REVIEW OF SYSTEMS:  Review of Systems  Constitutional: Positive for fatigue. Negative for chills and fever.  HENT:   Positive for trouble swallowing.   Eyes: Negative.   Respiratory:  Negative.  Negative for cough and shortness of breath.   Cardiovascular: Negative.  Negative for leg swelling.  Gastrointestinal: Negative.  Negative for blood in stool.  Endocrine: Negative.   Genitourinary: Negative for dysuria and hematuria.   Musculoskeletal: Negative.   Neurological: Positive for seizures (No seizures for ~2 years per patient's mother (followed by neurology)).  Hematological: Bruises/bleeds easily.     PAST MEDICAL/SURGICAL HISTORY:  Past Medical History:  Diagnosis Date  . Constipation   . Dysphagia   . Fracture    R foot  . GERD (gastroesophageal reflux disease)   . History of shingles 10/2016  . Mental retardation    lesion in head  . Seizures ([place])    "not fully controlled on max doses of meds" (Neuro ofc note 12/2014)  . Thrombocytopenia ([place])   . Tremor    Past Surgical History:  Procedure Laterality Date  . BIOPSY  09/16/2014   Procedure: BIOPSY;  Surgeon: Rogene Houston, MD;  Location: [facility];  Service: Endoscopy;;  . CATARACT EXTRACTION     both eyes, May of 2015  . ESOPHAGEAL DILATION N/A 09/16/2014   Procedure: ESOPHAGEAL DILATION WITH 54FR MALONEY DILATOR;  Surgeon: Rogene Houston, MD;  Location: [facility];  Service: Endoscopy;  Laterality: N/A;  . ESOPHAGOGASTRODUODENOSCOPY (EGD) WITH PROPOFOL N/A 09/16/2014   Procedure: ESOPHAGOGASTRODUODENOSCOPY (EGD) WITH PROPOFOL;  Surgeon: Rogene Houston, MD;  Location: [facility];  Service: Endoscopy;  Laterality: N/A;  . MOUTH SURGERY    .  Skin graft to gum Right 08/2013  . TONSILLECTOMY AND ADENOIDECTOMY    . TOTAL ABDOMINAL HYSTERECTOMY       SOCIAL HISTORY:  Social History   Socioeconomic History  . Marital status: Single    Spouse name: Not on file  . Number of children: 0  . Years of education: 10  . Highest education level: Not on file  Social Needs  . Financial resource strain: Not on file  . Food insecurity - worry: Not on file  . Food insecurity - inability: Not on file  .  Transportation needs - medical: Not on file  . Transportation needs - non-medical: Not on file  Occupational History    Employer: UNEMPLOYED  Tobacco Use  . Smoking status: Former Smoker    Packs/day: 1.50    Years: 15.00    Pack years: 22.50    Types: Cigarettes    Last attempt to quit: 05/22/2006    Years since quitting: 10.9  . Smokeless tobacco: Never Used  Substance and Sexual Activity  . Alcohol use: No    Alcohol/week: 0.0 oz  . Drug use: No  . Sexual activity: No    Birth control/protection: Other-see comments    Comment: Hysterectomy  Other Topics Concern  . Not on file  Social History Narrative   Patient is single and lives with her mother Romie Minus)      Patient drinks 3 cups of caffeine daily.   Patient is right handed.          FAMILY HISTORY:  Family History  Problem Relation Age of Onset  . High Cholesterol Mother   . High blood pressure Mother   . Diabetes Father     CURRENT MEDICATIONS:  Outpatient Encounter Medications as of 05/08/2017  Medication Sig  . Calcium Carb-Cholecalciferol (CALCIUM 500 +D) 500-400 MG-UNIT TABS Take 1 tablet by mouth 2 (two) times daily.  . Calcium Carbonate Antacid (TUMS PO) Take 1 tablet by mouth daily as needed (heartburn).   . cholecalciferol (VITAMIN D) 1000 UNITS tablet Take 1,000 Units by mouth daily. Reported on 05/30/2015  . clonazePAM (KLONOPIN) 0.5 MG tablet Take 1 tablet (0.5 mg total) by mouth 2 (two) times daily.  . divalproex (DEPAKOTE ER) 500 MG 24 hr tablet Take 1 tablet (500 mg total) by mouth 2 (two) times daily.  Marland Kitchen docusate sodium (COLACE) 100 MG capsule Take 100 mg by mouth daily as needed.   Marland Kitchen estradiol (VIVELLE-DOT) 0.075 MG/24HR Place 1 patch onto the skin 2 (two) times a week. On Wednesday and Saturday  . feeding supplement, ENSURE ENLIVE, (ENSURE ENLIVE) LIQD Take 237 mLs by mouth 2 (two) times daily between meals.  Marland Kitchen ibuprofen (ADVIL,MOTRIN) 200 MG tablet Take 200 mg by mouth every 6 (six) hours as  needed.  . lacosamide (VIMPAT) 200 MG TABS tablet Take 1 tablet (200 mg total) by mouth 2 (two) times daily.  Marland Kitchen levocetirizine (XYZAL) 5 MG tablet Take 5 mg by mouth every evening.   Marland Kitchen omeprazole (PRILOSEC) 40 MG capsule TAKE 1 CAPSULE DAILY  . ondansetron (ZOFRAN) 4 MG tablet Take 1 tablet (4 mg total) by mouth every 8 (eight) hours as needed.  Marland Kitchen oxybutynin (DITROPAN-XL) 5 MG 24 hr tablet Take 5 mg by mouth daily.  . Pediatric Multiple Vit-C-FA (PEDIATRIC MULTIVITAMIN) chewable tablet Chew 1 tablet by mouth daily.     No facility-administered encounter medications on file as of 05/08/2017.     ALLERGIES:  Allergies  Allergen Reactions  . Codeine  Nausea And Vomiting  . Lamictal [Lamotrigine] Rash     PHYSICAL EXAM:  ECOG Performance status: 1-2 - Symptomatic; requires occasional assistance given epilepsy  Vitals:   05/08/17 1508  BP: (!) 122/59  Pulse: 63  Resp: 16  Temp: 97.8 F (36.6 C)  SpO2: 100%   Filed Weights   05/08/17 1508  Weight: 137 lb 6.4 oz (62.3 kg)    Physical Exam  Constitutional: She is well-developed, well-nourished, and in no distress.  HENT:  Head: Normocephalic.  Mouth/Throat: Oropharynx is clear and moist. No oropharyngeal exudate.  Eyes: Conjunctivae are normal. Pupils are equal, round, and reactive to light. No scleral icterus.  Neck: Normal range of motion. Neck supple.  Cardiovascular: Normal rate and regular rhythm.  Pulmonary/Chest: Effort normal and breath sounds normal. No respiratory distress. She has no wheezes.  Abdominal: Soft. Bowel sounds are normal. There is no tenderness. There is no rebound.  Musculoskeletal: Normal range of motion. She exhibits no edema.  Lymphadenopathy:    She has no cervical adenopathy.       Right: No supraclavicular adenopathy present.       Left: No supraclavicular adenopathy present.  Neurological: She is alert. No cranial nerve deficit. Gait normal.  Slow to respond at times; some word finding  difficulty at times. Mild cognitive deficit appreciated (which may be secondary to neuro/psych medications).   Skin: Skin is warm.  -(R) antecubital area with large bruise that appears to be healing.   -Arms with mild petechial rash  Psychiatric: Mood and affect normal.  Nursing note and vitals reviewed.    LABORATORY DATA:  I have reviewed the labs as listed.  CBC    Component Value Date/Time   WBC 8.0 05/08/2017 1432   RBC 3.92 05/08/2017 1432   HGB 12.4 05/08/2017 1432   HGB 13.0 11/05/2016 1104   HCT 38.1 05/08/2017 1432   HCT 38.2 11/05/2016 1104   PLT 178 05/08/2017 1432   PLT 130 (L) 11/05/2016 1104   MCV 97.2 05/08/2017 1432   MCV 94 11/05/2016 1104   MCH 31.6 05/08/2017 1432   MCHC 32.5 05/08/2017 1432   RDW 13.7 05/08/2017 1432   RDW 15.7 (H) 11/05/2016 1104   LYMPHSABS 3.2 05/08/2017 1432   LYMPHSABS 2.6 11/05/2016 1104   MONOABS 0.7 05/08/2017 1432   EOSABS 0.3 05/08/2017 1432   EOSABS 0.1 11/05/2016 1104   BASOSABS 0.0 05/08/2017 1432   BASOSABS 0.0 11/05/2016 1104   CMP Latest Ref Rng & Units 05/01/2017 11/05/2016 04/29/2016  Glucose 65 - 99 mg/dL 126(H) 86 95  BUN 6 - 20 mg/dL 28(H) 25(H) 24  Creatinine 0.44 - 1.00 mg/dL 0.86 0.79 0.71  Sodium 135 - 145 mmol/L 136 143 141  Potassium 3.5 - 5.1 mmol/L 3.8 4.4 4.2  Chloride 101 - 111 mmol/L 101 103 99  CO2 22 - 32 mmol/L 27 24 24   Calcium 8.9 - 10.3 mg/dL 9.2 9.8 9.4  Total Protein 6.5 - 8.1 g/dL 7.5 6.8 6.7  Total Bilirubin 0.3 - 1.2 mg/dL 0.7 0.3 0.3  Alkaline Phos 38 - 126 U/L 79 60 81  AST 15 - 41 U/L 24 20 18   ALT 14 - 54 U/L 23 17 19     PENDING LABS:    DIAGNOSTIC IMAGING:    PATHOLOGY:      ASSESSMENT & PLAN:   Thrombocytopenia:  -Thought to be low-grade/very stable ITP in the setting of low-medium IgG anticardiolipin antibody and negative platelet antibodies vs drug-induced from Depakote  for seizure disorder.   -Platelets have historically been >100,000 for several years.  Plt count  today 178,000 and normal.  Provided reassurance that her CBC is completely within normal limits today.   -Recommend she take NSAIDs (like ibuprofen) only as needed, as continued NSAID use can decrease platelet count.  However, her ITP has never been severe (with plt count <50,000), so occasional NSAID use would generally be considered safe as needed.  -Will plan to have her return for labs only in 6 months.  -Return to cancer center in 1 year for follow-up with labs.  If subsequent labs remain normal, then we discussed potential of releasing her from follow-up at the cancer center at that time. She agrees with this plan.   Health maintenance/Primary care needs:  -Patient and her mother are considering changing PCP providers. I gave her a list of local PCPs in the [place] area. Encouraged her to call these practices to see if they are accepting new patients. Patient's mother voiced appreciation.     Dispo:  -Labs only in 6 months.  -Return to cancer center in 1 year for follow-up with labs.    All questions were answered to patient's stated satisfaction. Encouraged patient to call with any new concerns or questions before her next visit to the cancer center and we can certain see her sooner, if needed.    Plan of care discussed with Dr. Talbert Cage, who agrees with the above aforementioned.    Orders placed this encounter:  Orders Placed This Encounter  Procedures  . CBC with Differential/Platelet      Mike Craze, NP [place] [phone]

## 2017-05-08 NOTE — Patient Instructions (Addendum)
[place] at [facility] Discharge Instructions  RECOMMENDATIONS MADE BY THE CONSULTANT AND ANY TEST RESULTS WILL BE SENT TO YOUR REFERRING PHYSICIAN.  You were seen today by Mike Craze NP. Return in 6 months for labs. Return in 12 months for follow up.  Thank you for choosing [place] at [facility] to provide your oncology and hematology care.  To afford each patient quality time with our provider, please arrive at least 15 minutes before your scheduled appointment time.    If you have a lab appointment with the [facility] please come in thru the  Main Entrance and check in at the main information desk  You need to re-schedule your appointment should you arrive 10 or more minutes late.  We strive to give you quality time with our providers, and arriving late affects you and other patients whose appointments are after yours.  Also, if you no show three or more times for appointments you may be dismissed from the clinic at the providers discretion.     Again, thank you for choosing [facility].  Our hope is that these requests will decrease the amount of time that you wait before being seen by our physicians.       _____________________________________________________________  Should you have questions after your visit to [facility], please contact our office at [phone] between the hours of 8:30 a.m. and 4:30 p.m.  Voicemails left after 4:30 p.m. will not be returned until the following business day.  For prescription refill requests, have your pharmacy contact our office.       Resources For Cancer Patients and their Caregivers ? American Cancer Society: Can assist with transportation, wigs, general needs, runs Look Good Feel Better.        4638282052 ? Cancer Care: Provides financial assistance, online support groups, medication/co-pay assistance.  1-800-813-HOPE 279-799-6321) ? [facility] Assists [place] cancer patients and their families through emotional , educational and financial support.  731-565-5891 ? [place] DSS Where to apply for food stamps, Medicaid and utility assistance. 612-626-2863 ? [employer]: Transportation to medical appointments. 216-325-5400 ? Social Security Administration: May apply for disability if have a Stage IV cancer. ([phone] 7275538501 ? [employer], Disability and Transit Services: Assists with nutrition, care and transit needs. [place] Support Programs: @10RELATIVEDAYS @ > Cancer Support Group  2nd Tuesday of the month 1pm-2pm, Journey Room  > Creative Journey  3rd Tuesday of the month 1130am-1pm, Journey Room  > Look Good Feel Better  1st Wednesday of the month 10am-12 noon, Journey Room (Call [place] to register [phone])

## 2017-06-11 ENCOUNTER — Emergency Department (HOSPITAL_COMMUNITY): Payer: Medicare Other

## 2017-06-11 ENCOUNTER — Emergency Department (HOSPITAL_COMMUNITY)
Admission: EM | Admit: 2017-06-11 | Discharge: 2017-06-11 | Disposition: A | Discharge status: Home / Self Care | Payer: Medicare Other | Attending: Emergency Medicine | Admitting: Emergency Medicine

## 2017-06-11 ENCOUNTER — Other Ambulatory Visit: Payer: Self-pay

## 2017-06-11 ENCOUNTER — Encounter (HOSPITAL_COMMUNITY): Payer: Self-pay | Admitting: Emergency Medicine

## 2017-06-11 DIAGNOSIS — R079 Chest pain, unspecified: Secondary | ICD-10-CM | POA: Diagnosis not present

## 2017-06-11 DIAGNOSIS — Z79899 Other long term (current) drug therapy: Secondary | ICD-10-CM | POA: Insufficient documentation

## 2017-06-11 DIAGNOSIS — B349 Viral infection, unspecified: Secondary | ICD-10-CM | POA: Insufficient documentation

## 2017-06-11 DIAGNOSIS — R05 Cough: Secondary | ICD-10-CM | POA: Diagnosis not present

## 2017-06-11 DIAGNOSIS — E860 Dehydration: Secondary | ICD-10-CM | POA: Insufficient documentation

## 2017-06-11 DIAGNOSIS — Z87891 Personal history of nicotine dependence: Secondary | ICD-10-CM | POA: Diagnosis not present

## 2017-06-11 DIAGNOSIS — R509 Fever, unspecified: Secondary | ICD-10-CM | POA: Diagnosis present

## 2017-06-11 DIAGNOSIS — J029 Acute pharyngitis, unspecified: Secondary | ICD-10-CM | POA: Diagnosis not present

## 2017-06-11 LAB — HEPATIC FUNCTION PANEL
ALT: 17 U/L (ref 14–54)
AST: 20 U/L (ref 15–41)
Albumin: 4.1 g/dL (ref 3.5–5.0)
Alkaline Phosphatase: 55 U/L (ref 38–126)
BILIRUBIN INDIRECT: 0.5 mg/dL (ref 0.3–0.9)
Bilirubin, Direct: 0.1 mg/dL (ref 0.1–0.5)
TOTAL PROTEIN: 7.5 g/dL (ref 6.5–8.1)
Total Bilirubin: 0.6 mg/dL (ref 0.3–1.2)

## 2017-06-11 LAB — CBC WITH DIFFERENTIAL/PLATELET
BASOS ABS: 0 10*3/uL (ref 0.0–0.1)
Basophils Relative: 0 %
Eosinophils Absolute: 0 10*3/uL (ref 0.0–0.7)
Eosinophils Relative: 0 %
HCT: 39 % (ref 36.0–46.0)
Hemoglobin: 12.5 g/dL (ref 12.0–15.0)
LYMPHS PCT: 14 %
Lymphs Abs: 2.1 10*3/uL (ref 0.7–4.0)
MCH: 30 pg (ref 26.0–34.0)
MCHC: 32.1 g/dL (ref 30.0–36.0)
MCV: 93.8 fL (ref 78.0–100.0)
Monocytes Absolute: 1.3 10*3/uL — ABNORMAL HIGH (ref 0.1–1.0)
Monocytes Relative: 9 %
NEUTROS ABS: 10.9 10*3/uL — AB (ref 1.7–7.7)
Neutrophils Relative %: 77 %
Platelets: 138 10*3/uL — ABNORMAL LOW (ref 150–400)
RBC: 4.16 MIL/uL (ref 3.87–5.11)
RDW: 13.5 % (ref 11.5–15.5)
WBC: 14.3 10*3/uL — AB (ref 4.0–10.5)

## 2017-06-11 LAB — BASIC METABOLIC PANEL
ANION GAP: 12 (ref 5–15)
BUN: 18 mg/dL (ref 6–20)
CO2: 21 mmol/L — AB (ref 22–32)
Calcium: 9.6 mg/dL (ref 8.9–10.3)
Chloride: 103 mmol/L (ref 101–111)
Creatinine, Ser: 0.97 mg/dL (ref 0.44–1.00)
GFR calc Af Amer: >60 mL/min (ref >60)
GLUCOSE: 111 mg/dL — AB (ref 65–99)
POTASSIUM: 4.1 mmol/L (ref 3.5–5.1)
Sodium: 136 mmol/L (ref 135–145)

## 2017-06-11 LAB — URINALYSIS, ROUTINE W REFLEX MICROSCOPIC
Bilirubin Urine: NEGATIVE
Glucose, UA: NEGATIVE mg/dL
HGB URINE DIPSTICK: NEGATIVE
Ketones, ur: NEGATIVE mg/dL
Leukocytes, UA: NEGATIVE
Nitrite: NEGATIVE
Protein, ur: NEGATIVE mg/dL
Specific Gravity, Urine: 1.016 (ref 1.005–1.030)
pH: 7 (ref 5.0–8.0)

## 2017-06-11 LAB — I-STAT CG4 LACTIC ACID, ED: Lactic Acid, Venous: 1.06 mmol/L (ref 0.5–1.9)

## 2017-06-11 LAB — INFLUENZA PANEL BY PCR (TYPE A & B)
INFLAPCR: NEGATIVE
INFLBPCR: NEGATIVE

## 2017-06-11 LAB — TROPONIN I: Troponin I: <0.03 ng/mL (ref <0.03)

## 2017-06-11 LAB — RAPID STREP SCREEN (MED CTR MEBANE ONLY): Streptococcus, Group A Screen (Direct): NEGATIVE

## 2017-06-11 LAB — VALPROIC ACID LEVEL: Valproic Acid Lvl: 106 ug/mL — ABNORMAL HIGH (ref 50.0–100.0)

## 2017-06-11 MED ORDER — SODIUM CHLORIDE 0.9 % IV BOLUS (SEPSIS)
1000.0000 mL | Freq: Once | INTRAVENOUS | Status: AC
  Administered 2017-06-11: 1000 mL via INTRAVENOUS

## 2017-06-11 MED ORDER — SODIUM CHLORIDE 0.9 % IV BOLUS (SEPSIS)
2000.0000 mL | Freq: Once | INTRAVENOUS | Status: AC
  Administered 2017-06-11: 2000 mL via INTRAVENOUS

## 2017-06-11 MED ORDER — LEVOFLOXACIN IN D5W 500 MG/100ML IV SOLN
500.0000 mg | Freq: Once | INTRAVENOUS | Status: AC
  Administered 2017-06-11: 500 mg via INTRAVENOUS
  Filled 2017-06-11: qty 100

## 2017-06-11 NOTE — ED Notes (Signed)
Pt ambulatory to bathroom and back to room with standby assist. Pt has steady gait.

## 2017-06-11 NOTE — ED Notes (Signed)
Crackers and water given. MD aware.

## 2017-06-11 NOTE — ED Triage Notes (Addendum)
Pt with mother. Pt not answering many of the questions , mother is. Pt alert, gen weakness noted. Pt does state hurts all over and her chest hurts. Denies n/v/d. Per caregiver pt is at baseline.

## 2017-06-11 NOTE — ED Provider Notes (Signed)
[facility] EMERGENCY DEPARTMENT Provider Note   CSN: [id number] Arrival date & time: 06/11/17  1437     History   Chief Complaint Chief Complaint  Patient presents with  . Fever  . Generalized Body Aches    HPI Maria [name] is a 57 y.o. female.  Has had fevers chills sore throat and aches for a few days   The history is provided by the patient and a relative. No language interpreter was used.  Illness  This is a new problem. The current episode started more than 2 days ago. The problem occurs constantly. The problem has not changed since onset.Pertinent negatives include no chest pain, no abdominal pain and no headaches. Nothing aggravates the symptoms. Nothing relieves the symptoms. She has tried nothing for the symptoms. The treatment provided no relief.    Past Medical History:  Diagnosis Date  . Constipation   . Dysphagia   . Fracture    R foot  . GERD (gastroesophageal reflux disease)   . History of shingles 10/2016  . Mental retardation    lesion in head  . Seizures ([place])    "not fully controlled on max doses of meds" (Neuro ofc note 12/2014)  . Thrombocytopenia ([place])   . Tremor     Patient Active Problem List   Diagnosis Date Noted  . Posterior tibial tendinitis, right leg 04/23/2016  . Pain in right foot 04/12/2016  . Acute encephalopathy   . Myoclonus   . Frequent falls 11/09/2015  . Fall 11/09/2015  . Chest pain 03/16/2014  . Upper abdominal pain 03/16/2014  . Seizure disorder ([place]) 03/16/2014  . 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 Encounter for therapeutic drug monitoring 10/06/2012  . Mental retardation   . Thrombocytopenia ([place]) 05/23/2011  . Seizures ([place]) 03/21/2011  . GERD (gastroesophageal reflux disease) 03/21/2011  . CLOSED FRACTURE OF ACROMIAL END OF CLAVICLE 07/13/2008    Past Surgical History:  Procedure Laterality Date  . BIOPSY  09/16/2014   Procedure: BIOPSY;   Surgeon: Rogene Houston, MD;  Location: [facility] ORS;  Service: Endoscopy;;  . CATARACT EXTRACTION     both eyes, May of 2015  . ESOPHAGEAL DILATION N/A 09/16/2014   Procedure: ESOPHAGEAL DILATION WITH 54FR MALONEY DILATOR;  Surgeon: Rogene Houston, MD;  Location: [facility] ORS;  Service: Endoscopy;  Laterality: N/A;  . ESOPHAGOGASTRODUODENOSCOPY (EGD) WITH PROPOFOL N/A 09/16/2014   Procedure: ESOPHAGOGASTRODUODENOSCOPY (EGD) WITH PROPOFOL;  Surgeon: Rogene Houston, MD;  Location: [facility] ORS;  Service: Endoscopy;  Laterality: N/A;  . MOUTH SURGERY    . Skin graft to gum Right 08/2013  . TONSILLECTOMY AND ADENOIDECTOMY    . TOTAL ABDOMINAL HYSTERECTOMY      OB History    No data available       Home Medications    Prior to Admission medications   Medication Sig Start Date End Date Taking? Authorizing Provider  Calcium Carb-Cholecalciferol (CALCIUM 500 +D) 500-400 MG-UNIT TABS Take 1 tablet by mouth 2 (two) times daily.   Yes [provider]  Calcium Carbonate Antacid (TUMS PO) Take 1 tablet by mouth daily as needed (heartburn).    Yes [provider]  cholecalciferol (VITAMIN D) 1000 UNITS tablet Take 1,000 Units by mouth daily. Reported on 05/30/2015   Yes [provider]  clonazePAM (KLONOPIN) 0.5 MG tablet Take 1 tablet (0.5 mg total) by mouth 2 (two) times daily. 05/01/17  Yes Ward Givens, NP  divalproex (DEPAKOTE ER) 500 MG 24 hr tablet Take 1 tablet (500 mg total) by mouth 2 (two) times daily. 07/23/16  Yes Ward Givens, NP  docusate sodium (COLACE) 100 MG capsule Take 100 mg by mouth daily as needed.    Yes [provider]  estradiol (VIVELLE-DOT) 0.075 MG/24HR Place 1 patch onto the skin 2 (two) times a week. On Wednesday and Saturday   Yes [provider]  feeding supplement, ENSURE ENLIVE, (ENSURE ENLIVE) LIQD Take 237 mLs by mouth 2 (two) times daily between meals. 11/13/15  Yes Robbie Lis, MD  ibuprofen (ADVIL,MOTRIN) 200 MG tablet Take  200 mg by mouth every 6 (six) hours as needed.   Yes [provider]  lacosamide (VIMPAT) 200 MG TABS tablet Take 1 tablet (200 mg total) by mouth 2 (two) times daily. 01/06/17  Yes Ward Givens, NP  levocetirizine (XYZAL) 5 MG tablet Take 5 mg by mouth every evening.    Yes [provider]  omeprazole (PRILOSEC) 40 MG capsule TAKE 1 CAPSULE DAILY 01/28/17  Yes Setzer, Terri L, NP  ondansetron (ZOFRAN) 4 MG tablet Take 1 tablet (4 mg total) by mouth every 8 (eight) hours as needed. 05/01/17  Yes Rolland Porter, MD  oxybutynin (DITROPAN-XL) 5 MG 24 hr tablet Take 5 mg by mouth daily.   Yes [provider]  Pediatric Multiple Vit-C-FA (PEDIATRIC MULTIVITAMIN) chewable tablet Chew 1 tablet by mouth daily.     Yes [provider]    Family History Family History  Problem Relation Age of Onset  . High Cholesterol Mother   . High blood pressure Mother   . Diabetes Father     Social History Social History   Tobacco Use  . Smoking status: Former Smoker    Packs/day: 1.50    Years: 15.00    Pack years: 22.50    Types: Cigarettes    Last attempt to quit: 05/22/2006    Years since quitting: 11.0  . Smokeless tobacco: Never Used  Substance Use Topics  . Alcohol use: No    Alcohol/week: 0.0 oz  . Drug use: No     Allergies   Codeine and Lamictal [lamotrigine]   Review of Systems Review of Systems  Constitutional: Negative for appetite change and fatigue.  HENT: Negative for congestion, ear discharge and sinus pressure.        Sore throat  Eyes: Negative for discharge.  Respiratory: Negative for cough.   Cardiovascular: Negative for chest pain.  Gastrointestinal: Negative for abdominal pain and diarrhea.  Genitourinary: Negative for frequency and hematuria.  Musculoskeletal: Negative for back pain.  Skin: Negative for rash.  Neurological: Negative for seizures and headaches.  Psychiatric/Behavioral: Negative for hallucinations.     Physical  Exam Updated Vital Signs BP 116/61   Pulse 79   Temp 98.7 F (37.1 C) (Oral)   Resp 15   SpO2 100%   Physical Exam  Constitutional: She is oriented to person, place, and time. She appears well-developed.  HENT:  Head: Normocephalic.  Pharynx mildly inflamed with dry mucous membranes  Eyes: Conjunctivae and EOM are normal. No scleral icterus.  Neck: Neck supple. No thyromegaly present.  Cardiovascular: Normal rate and regular rhythm. Exam reveals no gallop and no friction rub.  No murmur heard. Pulmonary/Chest: No stridor. She has no wheezes. She has no rales. She exhibits no tenderness.  Abdominal: She exhibits no distension. There is no tenderness. There is no rebound.  Musculoskeletal: Normal range of motion. She exhibits  no edema.  Lymphadenopathy:    She has no cervical adenopathy.  Neurological: She is oriented to person, place, and time. She exhibits normal muscle tone. Coordination normal.  Skin: No rash noted. No erythema.  Psychiatric: She has a normal mood and affect. Her behavior is normal.     ED Treatments / Results  Labs (all labs ordered are listed, but only abnormal results are displayed) Labs Reviewed  CBC WITH DIFFERENTIAL/PLATELET - Abnormal; Notable for the following components:      Result Value   WBC 14.3 (*)    Platelets 138 (*)    Neutro Abs 10.9 (*)    Monocytes Absolute 1.3 (*)    All other components within normal limits  BASIC METABOLIC PANEL - Abnormal; Notable for the following components:   CO2 21 (*)    Glucose, Bld 111 (*)    All other components within normal limits  URINALYSIS, ROUTINE W REFLEX MICROSCOPIC - Abnormal; Notable for the following components:   APPearance HAZY (*)    All other components within normal limits  VALPROIC ACID LEVEL - Abnormal; Notable for the following components:   Valproic Acid Lvl 106 (*)    All other components within normal limits  CULTURE, BLOOD (ROUTINE X 2)  CULTURE, BLOOD (ROUTINE X 2)  RAPID  STREP SCREEN (NOT AT [facility])  CULTURE, GROUP A STREP ([place])  TROPONIN I  HEPATIC FUNCTION PANEL  INFLUENZA PANEL BY PCR (TYPE A & B)  I-STAT CG4 LACTIC ACID, ED    EKG  EKG Interpretation None       Radiology Dg Chest 2 View  Result Date: 06/11/2017 CLINICAL DATA:  Cough and chest pain EXAM: CHEST  2 VIEW COMPARISON:  October 01, 2015 FINDINGS: There are nipple shadows bilaterally. There is scarring in the right base. There is no edema or consolidation. The heart size and pulmonary vascularity are normal. No adenopathy. There is aortic atherosclerosis. No pneumothorax. No bone lesions. IMPRESSION: Scarring right base. No edema or consolidation. Stable cardiac silhouette. There is aortic atherosclerosis. Aortic Atherosclerosis (ICD10-I70.0). Electronically Signed   By: Lowella Grip III M.D.   On: 06/11/2017 15:45    Procedures Procedures (including critical care time)  Medications Ordered in ED Medications  sodium chloride 0.9 % bolus 2,000 mL (0 mLs Intravenous Stopped 06/11/17 1946)  levofloxacin (LEVAQUIN) IVPB 500 mg (0 mg Intravenous Stopped 06/11/17 1946)  sodium chloride 0.9 % bolus 1,000 mL (0 mLs Intravenous Stopped 06/11/17 2002)     Initial Impression / Assessment and Plan / ED Course  I have reviewed the triage vital signs and the nursing notes.  Pertinent labs & imaging results that were available during my care of the patient were reviewed by me and considered in my medical decision making (see chart for details).     Patient with viral syndrome dehydration.  Patient improved with IV fluids and will follow up with her PCP  Final Clinical Impressions(s) / ED Diagnoses   Final diagnoses:  Viral syndrome  Dehydration    ED Discharge Orders    None       Milton Ferguson, MD 06/11/17 2019

## 2017-06-11 NOTE — Discharge Instructions (Signed)
Drink plenty of fluids.  Take Tylenol for fever and aches.  Follow-up with your family doctor next week

## 2017-06-11 NOTE — ED Notes (Signed)
Pt states symptoms started yesterday with fever and bodyaches with sore throat.

## 2017-06-14 LAB — CULTURE, GROUP A STREP (THRC)

## 2017-06-16 LAB — CULTURE, BLOOD (ROUTINE X 2)
Culture: NO GROWTH
Culture: NO GROWTH
SPECIAL REQUESTS: ADEQUATE
Special Requests: ADEQUATE

## 2017-06-18 ENCOUNTER — Telehealth: Payer: Self-pay | Admitting: Adult Health

## 2017-06-18 DIAGNOSIS — Z0001 Encounter for general adult medical examination with abnormal findings: Secondary | ICD-10-CM | POA: Diagnosis not present

## 2017-06-18 DIAGNOSIS — Z6821 Body mass index (BMI) 21.0-21.9, adult: Secondary | ICD-10-CM | POA: Diagnosis not present

## 2017-06-18 DIAGNOSIS — Z1389 Encounter for screening for other disorder: Secondary | ICD-10-CM | POA: Diagnosis not present

## 2017-06-18 DIAGNOSIS — Z0000 Encounter for general adult medical examination without abnormal findings: Secondary | ICD-10-CM | POA: Diagnosis not present

## 2017-06-18 DIAGNOSIS — R946 Abnormal results of thyroid function studies: Secondary | ICD-10-CM | POA: Diagnosis not present

## 2017-06-18 DIAGNOSIS — R569 Unspecified convulsions: Secondary | ICD-10-CM

## 2017-06-18 DIAGNOSIS — E785 Hyperlipidemia, unspecified: Secondary | ICD-10-CM | POA: Diagnosis not present

## 2017-06-18 NOTE — Telephone Encounter (Signed)
Pts mother is call requesting a refill for lacosamide (VIMPAT) 200 MG TABS tablet and divalproex (DEPAKOTE ER) 500 MG 24 hr tablet sent to [place], [place]

## 2017-06-19 MED ORDER — LACOSAMIDE 200 MG PO TABS: 200.0000 mg | ORAL_TABLET | Freq: Two times a day (BID) | ORAL | 3 refills | Status: DC

## 2017-06-19 MED ORDER — DIVALPROEX SODIUM ER 500 MG PO TB24: 500.0000 mg | ORAL_TABLET | Freq: Two times a day (BID) | ORAL | 3 refills | Status: DC

## 2017-06-19 NOTE — Telephone Encounter (Signed)
Fax confirmation received for vimpat to [facility] [phone].

## 2017-06-25 DIAGNOSIS — Z1231 Encounter for screening mammogram for malignant neoplasm of breast: Secondary | ICD-10-CM | POA: Diagnosis not present

## 2017-07-07 DIAGNOSIS — K219 Gastro-esophageal reflux disease without esophagitis: Secondary | ICD-10-CM | POA: Diagnosis not present

## 2017-07-07 DIAGNOSIS — E782 Mixed hyperlipidemia: Secondary | ICD-10-CM | POA: Diagnosis not present

## 2017-07-07 DIAGNOSIS — N3281 Overactive bladder: Secondary | ICD-10-CM | POA: Diagnosis not present

## 2017-07-07 DIAGNOSIS — G40909 Epilepsy, unspecified, not intractable, without status epilepticus: Secondary | ICD-10-CM | POA: Diagnosis not present

## 2017-09-04 ENCOUNTER — Encounter (HOSPITAL_COMMUNITY): Payer: Self-pay | Admitting: *Deleted

## 2017-09-04 ENCOUNTER — Emergency Department (HOSPITAL_COMMUNITY): Payer: Medicare Other

## 2017-09-04 ENCOUNTER — Emergency Department (HOSPITAL_COMMUNITY)
Admission: EM | Admit: 2017-09-04 | Discharge: 2017-09-04 | Disposition: A | Discharge status: Home / Self Care | Payer: Medicare Other | Attending: Emergency Medicine | Admitting: Emergency Medicine

## 2017-09-04 ENCOUNTER — Other Ambulatory Visit: Payer: Self-pay

## 2017-09-04 ENCOUNTER — Ambulatory Visit (INDEPENDENT_AMBULATORY_CARE_PROVIDER_SITE_OTHER): Payer: Medicare Other | Admitting: Internal Medicine

## 2017-09-04 DIAGNOSIS — Z87891 Personal history of nicotine dependence: Secondary | ICD-10-CM | POA: Diagnosis not present

## 2017-09-04 DIAGNOSIS — S299XXA Unspecified injury of thorax, initial encounter: Secondary | ICD-10-CM | POA: Diagnosis present

## 2017-09-04 DIAGNOSIS — S20222A Contusion of left back wall of thorax, initial encounter: Secondary | ICD-10-CM

## 2017-09-04 DIAGNOSIS — Y929 Unspecified place or not applicable: Secondary | ICD-10-CM | POA: Insufficient documentation

## 2017-09-04 DIAGNOSIS — Y939 Activity, unspecified: Secondary | ICD-10-CM | POA: Diagnosis not present

## 2017-09-04 DIAGNOSIS — S20219A Contusion of unspecified front wall of thorax, initial encounter: Secondary | ICD-10-CM | POA: Diagnosis not present

## 2017-09-04 DIAGNOSIS — W19XXXA Unspecified fall, initial encounter: Secondary | ICD-10-CM | POA: Insufficient documentation

## 2017-09-04 DIAGNOSIS — Z79899 Other long term (current) drug therapy: Secondary | ICD-10-CM | POA: Diagnosis not present

## 2017-09-04 DIAGNOSIS — R0789 Other chest pain: Secondary | ICD-10-CM | POA: Diagnosis not present

## 2017-09-04 DIAGNOSIS — M545 Low back pain: Secondary | ICD-10-CM | POA: Diagnosis not present

## 2017-09-04 DIAGNOSIS — Y999 Unspecified external cause status: Secondary | ICD-10-CM | POA: Diagnosis not present

## 2017-09-04 DIAGNOSIS — M546 Pain in thoracic spine: Secondary | ICD-10-CM | POA: Diagnosis not present

## 2017-09-04 MED ORDER — IBUPROFEN 600 MG PO TABS: 600.0000 mg | ORAL_TABLET | Freq: Four times a day (QID) | ORAL | 0 refills | Status: DC

## 2017-09-04 MED ORDER — ONDANSETRON HCL 4 MG PO TABS
4.0000 mg | ORAL_TABLET | Freq: Once | ORAL | Status: AC
Start: 2017-09-04 — End: 2017-09-04
  Administered 2017-09-04: 4 mg via ORAL
  Filled 2017-09-04: qty 1

## 2017-09-04 MED ORDER — METHOCARBAMOL 500 MG PO TABS: 500.0000 mg | ORAL_TABLET | Freq: Three times a day (TID) | ORAL | 0 refills | Status: DC

## 2017-09-04 MED ORDER — HYDROCODONE-ACETAMINOPHEN 5-325 MG PO TABS
2.0000 | ORAL_TABLET | Freq: Once | ORAL | Status: AC
  Administered 2017-09-04: 2 via ORAL
  Filled 2017-09-04: qty 2

## 2017-09-04 NOTE — ED Triage Notes (Signed)
Pt c/o fall backwards on Monday afternoon after slipping on something on the floor. Denies LOC. Pt c/o pain to the left side of her back and left side of her chest.

## 2017-09-04 NOTE — ED Provider Notes (Signed)
[facility] EMERGENCY DEPARTMENT Provider Note   CSN: [id number] Arrival date & time: 09/04/17  1553     History   Chief Complaint Chief Complaint  Patient presents with  . Back Pain    HPI Maria [name] is a 57 y.o. female.  Patient is a 57 year old female who presents to the emergency department with her mother because of back pain and side pain following a fall.  Patient has a history of mental retardation.  The history is obtained from the patient and her mother.  Mother reports that the patient slipped on something in the floor, fell on Monday, April 1, and injured her back as well as her left side.  Mother states that the patient complained initially but then did not complain is much until yesterday when she started complaining of pain with taking a deep breath, as well as pain with turning in certain directions.  She complains of pain with changing positions from sitting to standing.  She has a small broken skin area on the left mid back area.  The mother denies the patient being on any anticoagulation medications.  No previous operations or procedures involving the chest, or the back.  Patient presents now for assistance with this issue.     Past Medical History:  Diagnosis Date  . Constipation   . Dysphagia   . Fracture    R foot  . GERD (gastroesophageal reflux disease)   . History of shingles 10/2016  . Mental retardation    lesion in head  . Seizures ([place])    "not fully controlled on max doses of meds" (Neuro ofc note 12/2014)  . Thrombocytopenia ([place])   . Tremor     Patient Active Problem List   Diagnosis Date Noted  . Posterior tibial tendinitis, right leg 04/23/2016  . Pain in right foot 04/12/2016  . Acute encephalopathy   . Myoclonus   . Frequent falls 11/09/2015  . Fall 11/09/2015  . Chest pain 03/16/2014  . Upper abdominal pain 03/16/2014  . Seizure disorder ([place]) 03/16/2014  . 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 Generalized  convulsive epilepsy ([place]) 10/06/2012  . Encounter for therapeutic drug monitoring 10/06/2012  . Mental retardation   . Thrombocytopenia ([place]) 05/23/2011  . Seizures ([place]) 03/21/2011  . GERD (gastroesophageal reflux disease) 03/21/2011  . CLOSED FRACTURE OF ACROMIAL END OF CLAVICLE 07/13/2008    Past Surgical History:  Procedure Laterality Date  . BIOPSY  09/16/2014   Procedure: BIOPSY;  Surgeon: Rogene Houston, MD;  Location: [facility]S;  Service: Endoscopy;;  . CATARACT EXTRACTION     both eyes, May of 2015  . ESOPHAGEAL DILATION N/A 09/16/2014   Procedure: ESOPHAGEAL DILATION WITH 54FR MALONEY DILATOR;  Surgeon: Rogene Houston, MD;  Location: [facility] ORS;  Service: Endoscopy;  Laterality: N/A;  . ESOPHAGOGASTRODUODENOSCOPY (EGD) WITH PROPOFOL N/A 09/16/2014   Procedure: ESOPHAGOGASTRODUODENOSCOPY (EGD) WITH PROPOFOL;  Surgeon: Rogene Houston, MD;  Location: [facility]S;  Service: Endoscopy;  Laterality: N/A;  . MOUTH SURGERY    . Skin graft to gum Right 08/2013  . TONSILLECTOMY AND ADENOIDECTOMY    . TOTAL ABDOMINAL HYSTERECTOMY       OB History   None      Home Medications    Prior to Admission medications   Medication Sig Start Date End Date Taking? Authorizing Provider  Calcium Carb-Cholecalciferol (CALCIUM 500 +D) 500-400 MG-UNIT TABS Take 1 tablet by mouth 2 (two) times daily.    [provider]  Calcium Carbonate Antacid (TUMS PO) Take 1 tablet by mouth daily as needed (heartburn).     [provider]  cholecalciferol (VITAMIN D) 1000 UNITS tablet Take 1,000 Units by mouth daily. Reported on 05/30/2015    [provider]  clonazePAM (KLONOPIN) 0.5 MG tablet Take 1 tablet (0.5 mg total) by mouth 2 (two) times daily. 05/01/17   Ward Givens, NP  divalproex (DEPAKOTE ER) 500 MG 24 hr tablet Take 1 tablet (500 mg total) by mouth 2 (two) times daily. 06/19/17   Ward Givens, NP  docusate sodium (COLACE) 100 MG capsule Take 100 mg by mouth daily as needed.      [provider]  estradiol (VIVELLE-DOT) 0.075 MG/24HR Place 1 patch onto the skin 2 (two) times a week. On Wednesday and Saturday    [provider]  feeding supplement, ENSURE ENLIVE, (ENSURE ENLIVE) LIQD Take 237 mLs by mouth 2 (two) times daily between meals. 11/13/15   Robbie Lis, MD  ibuprofen (ADVIL,MOTRIN) 200 MG tablet Take 200 mg by mouth every 6 (six) hours as needed.    [provider]  lacosamide (VIMPAT) 200 MG TABS tablet Take 1 tablet (200 mg total) by mouth 2 (two) times daily. 06/19/17   Ward Givens, NP  levocetirizine (XYZAL) 5 MG tablet Take 5 mg by mouth every evening.     [provider]  omeprazole (PRILOSEC) 40 MG capsule TAKE 1 CAPSULE DAILY 01/28/17   Setzer, Terri L, NP  ondansetron (ZOFRAN) 4 MG tablet Take 1 tablet (4 mg total) by mouth every 8 (eight) hours as needed. 05/01/17   Rolland Porter, MD  oxybutynin (DITROPAN-XL) 5 MG 24 hr tablet Take 5 mg by mouth daily.    [provider]  Pediatric Multiple Vit-C-FA (PEDIATRIC MULTIVITAMIN) chewable tablet Chew 1 tablet by mouth daily.      [provider]    Family History Family History  Problem Relation Age of Onset  . High Cholesterol Mother   . High blood pressure Mother   . Diabetes Father     Social History Social History   Tobacco Use  . Smoking status: Former Smoker    Packs/day: 1.50    Years: 15.00    Pack years: 22.50    Types: Cigarettes    Last attempt to quit: 05/22/2006    Years since quitting: 11.2  . Smokeless tobacco: Never Used  Substance Use Topics  . Alcohol use: No    Alcohol/week: 0.0 oz  . Drug use: No     Allergies   Codeine and Lamictal [lamotrigine]   Review of Systems Review of Systems  Constitutional: Negative for activity change.       All ROS Neg except as noted in HPI  HENT: Negative for nosebleeds.   Eyes: Negative for photophobia and discharge.  Respiratory: Negative for cough, shortness of breath and  wheezing.        Chest wall pain  Cardiovascular: Negative for chest pain and palpitations.  Gastrointestinal: Negative for abdominal pain and blood in stool.  Genitourinary: Negative for dysuria, frequency and hematuria.  Musculoskeletal: Positive for back pain. Negative for arthralgias and neck pain.  Skin: Negative.   Neurological: Negative for dizziness, seizures and speech difficulty.  Psychiatric/Behavioral: Negative for confusion and hallucinations.     Physical Exam Updated Vital Signs BP 111/69 (BP Location: Right Arm)   Pulse 70   Temp 98.4 F (36.9 C) (Oral)   Resp 16   Wt  63.7 kg (140 lb 8 oz)   SpO2 98%   BMI 23.38 kg/m   Physical Exam  Constitutional: She is oriented to person, place, and time. She appears well-developed and well-nourished.  Non-toxic appearance.  HENT:  Head: Normocephalic.  Right Ear: Tympanic membrane and external ear normal.  Left Ear: Tympanic membrane and external ear normal.  Eyes: Pupils are equal, round, and reactive to light. EOM and lids are normal.  Neck: Normal range of motion. Neck supple. Carotid bruit is not present.  Cardiovascular: Normal rate, regular rhythm, normal heart sounds, intact distal pulses and normal pulses.  Pulmonary/Chest: Breath sounds normal. No respiratory distress. She exhibits tenderness and bony tenderness. She exhibits no crepitus and no swelling.    Abdominal: Soft. Bowel sounds are normal. There is no tenderness. There is no guarding.  Musculoskeletal: Normal range of motion.       Thoracic back: She exhibits bony tenderness and pain. She exhibits no deformity.       Lumbar back: She exhibits bony tenderness and pain.       Back:  Lymphadenopathy:       Head (right side): No submandibular adenopathy present.       Head (left side): No submandibular adenopathy present.    She has no cervical adenopathy.  Neurological: She is alert and oriented to person, place, and time. She has normal strength. No  cranial nerve deficit or sensory deficit.  Skin: Skin is warm and dry.  Psychiatric: She has a normal mood and affect. Her speech is normal.  Nursing note and vitals reviewed.    ED Treatments / Results  Labs (all labs ordered are listed, but only abnormal results are displayed) Labs Reviewed - No data to display  EKG None  Radiology No results found.  Procedures Procedures (including critical care time)  Medications Ordered in ED Medications  HYDROcodone-acetaminophen (NORCO/VICODIN) 5-325 MG per tablet 2 tablet (has no administration in time range)  ondansetron (ZOFRAN) tablet 4 mg (has no administration in time range)     Initial Impression / Assessment and Plan / ED Course  I have reviewed the triage vital signs and the nursing notes.  Pertinent labs & imaging results that were available during my care of the patient were reviewed by me and considered in my medical decision making (see chart for details).      Final Clinical Impressions(s) / ED Diagnoses MDM  Patient speaks in complete sentences.  Patient is ambulatory to the bathroom without problem.  Vital signs within normal limits.  X-ray of the thoracic spine is negative for compression fracture or dislocation.  X-ray of the lumbar spine is negative for compression fracture or dislocation.  X-ray of the left ribs and chest is negative for acute fracture.  There is an old fracture noted but no acute changes.  No pneumothorax appreciated.  Patient states pain is improving after medication here in the emergency department.  I have discussed the findings of the examination as well as the findings on the x-rays with the patient and the mother in terms of which they understand.  The patient will be prescribed Robaxin and ibuprofen.  Have asked him to use a heating pad to the area for muscle strain and contusion to the chest wall.  The family is in agreement with this plan.  They will follow-up with Dr. [place] Crane in the office  if any changes, problems, or concerns.   Final diagnoses:  Chest wall pain  Contusion of left side  of back, initial encounter    ED Discharge Orders        Ordered    methocarbamol (ROBAXIN) 500 MG tablet  3 times daily     09/04/17 1806    ibuprofen (ADVIL,MOTRIN) 600 MG tablet  4 times daily     09/04/17 1806       Lily Kocher, PA-C 09/04/17 1811    Margette Fast, MD 09/05/17 1022

## 2017-09-04 NOTE — Discharge Instructions (Addendum)
Your vital signs within normal limits.  Your oxygen level is within normal limits.  The x-ray of your back is negative for fracture or dislocation.  The x-ray of your ribs is negative for any new fracture.  The x-ray of your lung is negative for any  acute problem.  I suspect that you have a contusion to your back and chest wall.  Please use Robaxin 3 times daily for the muscle spasms in this area.  Please use ibuprofen with breakfast, lunch, dinner, and at bedtime.  Heating pad to this area will also be helpful.  Please see Dr. [place] Crane in the office for additional evaluation if this is not improving.

## 2017-09-18 ENCOUNTER — Ambulatory Visit (INDEPENDENT_AMBULATORY_CARE_PROVIDER_SITE_OTHER): Payer: Medicare Other | Admitting: Internal Medicine

## 2017-09-19 ENCOUNTER — Ambulatory Visit (INDEPENDENT_AMBULATORY_CARE_PROVIDER_SITE_OTHER): Payer: Medicare Other | Admitting: Internal Medicine

## 2017-09-24 ENCOUNTER — Ambulatory Visit (INDEPENDENT_AMBULATORY_CARE_PROVIDER_SITE_OTHER): Payer: Medicare Other | Admitting: Internal Medicine

## 2017-09-25 ENCOUNTER — Ambulatory Visit (INDEPENDENT_AMBULATORY_CARE_PROVIDER_SITE_OTHER): Payer: Medicare Other | Admitting: Internal Medicine

## 2017-09-25 ENCOUNTER — Encounter (INDEPENDENT_AMBULATORY_CARE_PROVIDER_SITE_OTHER): Payer: Self-pay | Admitting: Internal Medicine

## 2017-09-25 VITALS — BP 140/64 | HR 60 | Temp 97.9°F | Ht 66.0 in | Wt 140.2 lb

## 2017-09-25 DIAGNOSIS — K219 Gastro-esophageal reflux disease without esophagitis: Secondary | ICD-10-CM

## 2017-09-25 NOTE — Patient Instructions (Signed)
Continue the Omeprazole.  OV in 1 year.  

## 2017-09-25 NOTE — Progress Notes (Signed)
Subjective:    Patient ID: Maria [name], female    DOB: 1961-04-25, 57 y.o.   MRN: [id number] 66 in.  HPI  Here today for f/u. Last seen in April of 2018. Wt last year was 151. Today her weight is 140.2. She tells me she is doing good. Her mother is in room providing history.  She has a BM about once a day. No melena or BRRB. Her appetite is good.  She has 10 pounds since her last visit. She has just returned from a Cruise to [place]. She says she enjoyed the trip.  Underwent an EGD in 2016 for chronic GERD/dysphagia. Hx of Schatzki's ring. Has been dilated x 4 in the past.  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 Noncritical Schatzki's ring and small sliding hiatal hernia. Small hyperplastic appearing polyps at gastric body. These were left alone. Erosive gastritis with small gastric ulcer at antrum. Esophagus dilated by passing 43 French Maloney dilator resulting in linear disruption at GE junction and also it proximal esophagus indicative of disrupted web. Biopsy revealed benign ulcer and H. Pylori stain was negative.    Review of Systems Past Medical History:  Diagnosis Date  . Constipation   . Dysphagia   . Fracture    R foot  . GERD (gastroesophageal reflux disease)   . History of shingles 10/2016  . Mental retardation    lesion in head  . Seizures ([place])    "not fully controlled on max doses of meds" (Neuro ofc note 12/2014)  . Thrombocytopenia ([place])   . Tremor     Past Surgical History:  Procedure Laterality Date  . BIOPSY  09/16/2014   Procedure: BIOPSY;  Surgeon: Rogene Houston, MD;  Location: [facility];  Service: Endoscopy;;  . CATARACT EXTRACTION     both eyes, May of 2015  . ESOPHAGEAL DILATION N/A 09/16/2014   Procedure: ESOPHAGEAL DILATION WITH 54FR MALONEY DILATOR;  Surgeon: Rogene Houston, MD;  Location: [facility];  Service: Endoscopy;  Laterality: N/A;  . ESOPHAGOGASTRODUODENOSCOPY (EGD) WITH PROPOFOL N/A 09/16/2014   Procedure: ESOPHAGOGASTRODUODENOSCOPY (EGD) WITH PROPOFOL;  Surgeon: Rogene Houston, MD;  Location: [facility];  Service: Endoscopy;  Laterality: N/A;  . MOUTH SURGERY    . Skin graft to gum Right 08/2013  . TONSILLECTOMY AND ADENOIDECTOMY    . TOTAL ABDOMINAL HYSTERECTOMY      Allergies  Allergen Reactions  . Codeine Nausea And Vomiting  . Lamictal [Lamotrigine] Rash    Current Outpatient Medications on File Prior to Visit  Medication Sig Dispense Refill  . Calcium Carb-Cholecalciferol (CALCIUM 500 +D) 500-400 MG-UNIT TABS Take 1 tablet by mouth 2 (two) times daily.    . Calcium Carbonate Antacid (TUMS PO) Take 1 tablet by mouth daily as needed (heartburn).     . cholecalciferol (VITAMIN D) 1000 UNITS tablet Take 1,000 Units by mouth daily. Reported on 05/30/2015    . clonazePAM (KLONOPIN) 0.5 MG tablet Take 1 tablet (0.5 mg total) by mouth 2 (two) times daily. 60 tablet 5  . divalproex (DEPAKOTE ER) 500 MG 24 hr tablet Take 1 tablet (500 mg total) by mouth 2 (two) times daily. 180 tablet 3  . docusate sodium (COLACE) 100 MG capsule Take 100 mg by mouth daily as needed.     Marland Kitchen estradiol (VIVELLE-DOT) 0.075 MG/24HR Place 1 patch onto the skin 2 (two) times a week. On Wednesday and Saturday    . feeding supplement, ENSURE ENLIVE, (ENSURE ENLIVE) LIQD  Take 237 mLs by mouth 2 (two) times daily between meals. 237 mL 12  . ibuprofen (ADVIL,MOTRIN) 600 MG tablet Take 1 tablet (600 mg total) by mouth 4 (four) times daily. (Patient taking differently: Take 600 mg by mouth as needed. ) 30 tablet 0  . lacosamide (VIMPAT) 200 MG TABS tablet Take 1 tablet (200 mg total) by mouth 2 (two) times daily. 180 tablet 3  . levocetirizine (XYZAL) 5 MG tablet Take 5 mg by mouth every evening.     Marland Kitchen omeprazole (PRILOSEC) 40 MG capsule TAKE 1 CAPSULE DAILY 90 capsule 4  . oxybutynin (DITROPAN-XL) 5 MG 24 hr tablet Take 5 mg by mouth daily.    . Pediatric Multiple Vit-C-FA (PEDIATRIC MULTIVITAMIN) chewable tablet Chew  1 tablet by mouth daily.       No current facility-administered medications on file prior to visit.         Objective:   Physical Exam Blood pressure 140/64, pulse 60, temperature 97.9 F (36.6 C), height 5\' 6"  (1.676 m), weight 140 lb 3.2 oz (63.6 kg). Alert and oriented. Skin warm and dry. Oral mucosa is moist.   . Sclera anicteric, conjunctivae is pink. Thyroid not enlarged. No cervical lymphadenopathy. Lungs clear. Heart regular rate and rhythm.  Abdomen is soft. Bowel sounds are positive. No hepatomegaly. No abdominal masses felt. No tenderness.  No edema to lower extremities.           Assessment & Plan:  GERD. GERD controlled with Omeprazole. She is doing well. She will continue the Omeprazole. OV in 1 year.

## 2017-10-13 ENCOUNTER — Telehealth: Payer: Self-pay | Admitting: *Deleted

## 2017-10-13 NOTE — Telephone Encounter (Signed)
Patient came in today.  She thought she had an appt today but did not.  She wants to schedule one but wants one sooner than I can offer.  Also needs a 30 day supply of Klonopin Called to [facility] in [place].  Wonders is we could send a 3 month refill to [facility].  Please call.

## 2017-10-13 NOTE — Telephone Encounter (Signed)
Called and LVM for pt offering 10/16/17 at 2:30pm, check in 2:00pm. Asked her to call back asap to let me know if this works.

## 2017-10-13 NOTE — Telephone Encounter (Addendum)
Called and spoke w/ mother, Romie Minus (on [place]). Scheduled f/u for 10/16/17 at 2:30pm, check in 2:00pm. Advised refill requests will be addressed at f/u. She verbalized understanding and appreciation for call.

## 2017-10-16 ENCOUNTER — Other Ambulatory Visit: Payer: Self-pay

## 2017-10-16 ENCOUNTER — Encounter: Payer: Self-pay | Admitting: Neurology

## 2017-10-16 ENCOUNTER — Ambulatory Visit (INDEPENDENT_AMBULATORY_CARE_PROVIDER_SITE_OTHER): Payer: Medicare Other | Admitting: Neurology

## 2017-10-16 VITALS — BP 109/63 | HR 60 | Ht 66.0 in | Wt 139.0 lb

## 2017-10-16 DIAGNOSIS — R569 Unspecified convulsions: Secondary | ICD-10-CM

## 2017-10-16 MED ORDER — CLONAZEPAM 0.5 MG PO TABS: 0.5000 mg | ORAL_TABLET | Freq: Two times a day (BID) | ORAL | 1 refills | Status: DC

## 2017-10-16 NOTE — Progress Notes (Signed)
Reason for visit: Seizures  Maria [name] is an 57 y.o. female  History of present illness:  Maria [name] is a 57 year old right-handed white female with a history of mental retardation and seizures.  The patient has been quite well with her seizures, she has not had any seizures in over 2 years.  The patient does not go to bed until 1 AM, she will sleep at around 10, she is slightly drowsy during the day.  She takes clonazepam 0.5 mg twice daily, the mother indicates that since she has been on this medication she has not had any seizures.  The patient is also on Depakote and Vimpat.  She has had some mild gait instability on the clonazepam, she fell in December 2018, but not since.  Overall, she seems to be doing fairly well.  Past Medical History:  Diagnosis Date  . Constipation   . Dysphagia   . Fracture    R foot  . GERD (gastroesophageal reflux disease)   . History of shingles 10/2016  . Mental retardation    lesion in head  . Seizures (Maria [name])    "not fully controlled on max doses of meds" (Neuro ofc note 12/2014)  . Thrombocytopenia (Maria [name])   . Tremor     Past Surgical History:  Procedure Laterality Date  . BIOPSY  09/16/2014   Procedure: BIOPSY;  Surgeon: Rogene Houston, MD;  Location: [facility];  Service: Endoscopy;;  . CATARACT EXTRACTION     both eyes, May of 2015  . ESOPHAGEAL DILATION N/A 09/16/2014   Procedure: ESOPHAGEAL DILATION WITH 54FR MALONEY DILATOR;  Surgeon: Rogene Houston, MD;  Location: [facility];  Service: Endoscopy;  Laterality: N/A;  . ESOPHAGOGASTRODUODENOSCOPY (EGD) WITH PROPOFOL N/A 09/16/2014   Procedure: ESOPHAGOGASTRODUODENOSCOPY (EGD) WITH PROPOFOL;  Surgeon: Rogene Houston, MD;  Location: [facility];  Service: Endoscopy;  Laterality: N/A;  . MOUTH SURGERY    . Skin graft to gum Right 08/2013  . TONSILLECTOMY AND ADENOIDECTOMY    . TOTAL ABDOMINAL HYSTERECTOMY      Family History  Problem Relation Age of Onset  . High Cholesterol Mother   . High  blood pressure Mother   . Diabetes Father     Social history:  reports that she quit smoking about 11 years ago. Her smoking use included cigarettes. She has a 22.50 pack-year smoking history. She has never used smokeless tobacco. She reports that she does not drink alcohol or use drugs.    Allergies  Allergen Reactions  . Codeine Nausea And Vomiting  . Lamictal [Lamotrigine] Rash    Medications:  Prior to Admission medications   Medication Sig Start Date End Date Taking? Authorizing Provider  Calcium Carb-Cholecalciferol (CALCIUM 500 +D) 500-400 MG-UNIT TABS Take 1 tablet by mouth 2 (two) times daily.   Yes [provider]  Calcium Carbonate Antacid (TUMS PO) Take 1 tablet by mouth daily as needed (heartburn).    Yes [provider]  cholecalciferol (VITAMIN D) 1000 UNITS tablet Take 1,000 Units by mouth daily. Reported on 05/30/2015   Yes [provider]  clonazePAM (KLONOPIN) 0.5 MG tablet Take 1 tablet (0.5 mg total) by mouth 2 (two) times daily. 10/16/17  Yes Kathrynn Ducking, MD  divalproex (DEPAKOTE ER) 500 MG 24 hr tablet Take 1 tablet (500 mg total) by mouth 2 (two) times daily. 06/19/17  Yes Ward Givens, NP  docusate sodium (COLACE) 100 MG capsule Take 100 mg by mouth daily as needed.  Yes [provider]  estradiol (VIVELLE-DOT) 0.075 MG/24HR Place 1 patch onto the skin 2 (two) times a week. On Wednesday and Saturday   Yes [provider]  feeding supplement, ENSURE ENLIVE, (ENSURE ENLIVE) LIQD Take 237 mLs by mouth 2 (two) times daily between meals. 11/13/15  Yes Robbie Lis, MD  ibuprofen (ADVIL,MOTRIN) 600 MG tablet Take 1 tablet (600 mg total) by mouth 4 (four) times daily. Patient taking differently: Take 600 mg by mouth as needed.  09/04/17  Yes Lily Kocher, PA-C  lacosamide (VIMPAT) 200 MG TABS tablet Take 1 tablet (200 mg total) by mouth 2 (two) times daily. 06/19/17  Yes Ward Givens, NP  levocetirizine (XYZAL) 5 MG  tablet Take 5 mg by mouth every evening.    Yes [provider]  omeprazole (PRILOSEC) 40 MG capsule TAKE 1 CAPSULE DAILY 01/28/17  Yes Setzer, Terri L, NP  oxybutynin (DITROPAN-XL) 5 MG 24 hr tablet Take 5 mg by mouth daily.   Yes [provider]  Pediatric Multiple Vit-C-FA (PEDIATRIC MULTIVITAMIN) chewable tablet Chew 1 tablet by mouth daily.     Yes [provider]    ROS:  Out of a complete 14 system review of symptoms, the patient complains only of the following symptoms, and all other reviewed systems are negative.  Fatigue Daytime sleepiness Skin rash  Blood pressure 109/63, pulse 60, height 5\' 6"  (1.676 m), weight 139 lb (63 kg).  Physical Exam  General: The patient is alert and cooperative at the time of the examination.  Skin: No significant peripheral edema is noted.   Neurologic Exam  Mental status: The patient is alert and oriented x 3 at the time of the examination. The patient has apparent normal recent and remote memory, with an apparently normal attention span and concentration ability.   Cranial nerves: Facial symmetry is present. Speech is normal, no aphasia or dysarthria is noted. Extraocular movements are full. Visual fields are full.  Motor: The patient has good strength in all 4 extremities.  Sensory examination: Soft touch sensation is symmetric on the face, arms, and legs.  Coordination: The patient has good finger-nose-finger and heel-to-shin bilaterally.  Gait and station: The patient has a normal gait. Tandem gait is slightly unsteady. Romberg is negative. No drift is seen.  Reflexes: Deep tendon reflexes are symmetric.   Assessment/Plan:  1.  Intractable seizures  2.  Mental retardation  The patient is doing well on her current medication regimen, she had blood work done in January 2019 that included a Depakote level that was therapeutic.  Liver profile was unremarkable.  The patient will continue on her current  regimen, a prescription was sent in for the clonazepam, she will follow-up in 1 year, sooner if needed.   Jill Alexanders MD 10/16/2017 3:08 PM  [facility] [address] [place], [place] [address]  Phone [phone] Fax [phone]

## 2017-10-20 ENCOUNTER — Other Ambulatory Visit: Payer: Self-pay | Admitting: Neurology

## 2017-10-20 MED ORDER — CLONAZEPAM 0.5 MG PO TABS: 0.5000 mg | ORAL_TABLET | Freq: Two times a day (BID) | ORAL | 0 refills | Status: DC | PRN

## 2017-10-20 NOTE — Telephone Encounter (Signed)
Pt's mother called said clonazePAM (KLONOPIN) 0.5 MG tablet did not arrive on time. Pt has one pill left for this morning. She needs scripts sent to [facility]

## 2017-10-30 ENCOUNTER — Ambulatory Visit: Payer: Medicare Other | Admitting: Neurology

## 2017-11-06 ENCOUNTER — Inpatient Hospital Stay (HOSPITAL_COMMUNITY): Payer: Medicare Other | Attending: Hematology

## 2017-11-06 ENCOUNTER — Other Ambulatory Visit (HOSPITAL_COMMUNITY): Payer: Medicare Other

## 2017-11-06 DIAGNOSIS — D696 Thrombocytopenia, unspecified: Secondary | ICD-10-CM

## 2017-11-06 LAB — CBC WITH DIFFERENTIAL/PLATELET
BASOS ABS: 0 10*3/uL (ref 0.0–0.1)
BASOS PCT: 0 %
Eosinophils Absolute: 0.1 10*3/uL (ref 0.0–0.7)
Eosinophils Relative: 2 %
HEMATOCRIT: 40 % (ref 36.0–46.0)
HEMOGLOBIN: 13 g/dL (ref 12.0–15.0)
LYMPHS PCT: 37 %
Lymphs Abs: 2.7 10*3/uL (ref 0.7–4.0)
MCH: 31 pg (ref 26.0–34.0)
MCHC: 32.5 g/dL (ref 30.0–36.0)
MCV: 95.2 fL (ref 78.0–100.0)
MONO ABS: 0.9 10*3/uL (ref 0.1–1.0)
MONOS PCT: 12 %
NEUTROS ABS: 3.6 10*3/uL (ref 1.7–7.7)
NEUTROS PCT: 49 %
Platelets: 119 10*3/uL — ABNORMAL LOW (ref 150–400)
RBC: 4.2 MIL/uL (ref 3.87–5.11)
RDW: 13.9 % (ref 11.5–15.5)
WBC: 7.3 10*3/uL (ref 4.0–10.5)

## 2017-12-31 ENCOUNTER — Telehealth: Payer: Self-pay | Admitting: Neurology

## 2017-12-31 MED ORDER — DIVALPROEX SODIUM ER 500 MG PO TB24: ORAL_TABLET | ORAL | 5 refills | Status: DC

## 2017-12-31 NOTE — Addendum Note (Signed)
Addended by: Hope Pigeon on: 12/31/2017 02:31 PM   Modules accepted: Orders

## 2017-12-31 NOTE — Telephone Encounter (Signed)
Sent refills to pharmacy as requested 

## 2017-12-31 NOTE — Telephone Encounter (Signed)
Patient's mother (on [place]) requesting new Rx for divalproex (DEPAKOTE ER) 500 MG 24 hr tablet sent to [facility]. She usually gets from [facility] but pharmacy is out of this medication.

## 2018-01-05 DIAGNOSIS — Z8669 Personal history of other diseases of the nervous system and sense organs: Secondary | ICD-10-CM | POA: Diagnosis not present

## 2018-01-05 DIAGNOSIS — Z6826 Body mass index (BMI) 26.0-26.9, adult: Secondary | ICD-10-CM | POA: Diagnosis not present

## 2018-01-05 DIAGNOSIS — N3281 Overactive bladder: Secondary | ICD-10-CM | POA: Diagnosis not present

## 2018-01-05 DIAGNOSIS — E782 Mixed hyperlipidemia: Secondary | ICD-10-CM | POA: Diagnosis not present

## 2018-02-17 DIAGNOSIS — N3281 Overactive bladder: Secondary | ICD-10-CM | POA: Diagnosis not present

## 2018-02-17 DIAGNOSIS — Z8669 Personal history of other diseases of the nervous system and sense organs: Secondary | ICD-10-CM | POA: Diagnosis not present

## 2018-02-17 DIAGNOSIS — K219 Gastro-esophageal reflux disease without esophagitis: Secondary | ICD-10-CM | POA: Diagnosis not present

## 2018-02-17 DIAGNOSIS — E782 Mixed hyperlipidemia: Secondary | ICD-10-CM | POA: Diagnosis not present

## 2018-02-17 DIAGNOSIS — G40909 Epilepsy, unspecified, not intractable, without status epilepticus: Secondary | ICD-10-CM | POA: Diagnosis not present

## 2018-02-17 DIAGNOSIS — Z6826 Body mass index (BMI) 26.0-26.9, adult: Secondary | ICD-10-CM | POA: Diagnosis not present

## 2018-02-24 DIAGNOSIS — Z0001 Encounter for general adult medical examination with abnormal findings: Secondary | ICD-10-CM | POA: Diagnosis not present

## 2018-02-24 DIAGNOSIS — K219 Gastro-esophageal reflux disease without esophagitis: Secondary | ICD-10-CM | POA: Diagnosis not present

## 2018-02-24 DIAGNOSIS — E782 Mixed hyperlipidemia: Secondary | ICD-10-CM | POA: Diagnosis not present

## 2018-02-24 DIAGNOSIS — N3281 Overactive bladder: Secondary | ICD-10-CM | POA: Diagnosis not present

## 2018-02-24 DIAGNOSIS — R569 Unspecified convulsions: Secondary | ICD-10-CM | POA: Diagnosis not present

## 2018-02-24 DIAGNOSIS — Z6826 Body mass index (BMI) 26.0-26.9, adult: Secondary | ICD-10-CM | POA: Diagnosis not present

## 2018-04-09 ENCOUNTER — Telehealth: Payer: Self-pay | Admitting: Neurology

## 2018-04-09 ENCOUNTER — Other Ambulatory Visit (INDEPENDENT_AMBULATORY_CARE_PROVIDER_SITE_OTHER): Payer: Self-pay | Admitting: Internal Medicine

## 2018-04-09 DIAGNOSIS — R569 Unspecified convulsions: Secondary | ICD-10-CM

## 2018-04-09 DIAGNOSIS — K219 Gastro-esophageal reflux disease without esophagitis: Secondary | ICD-10-CM

## 2018-04-09 MED ORDER — CLONAZEPAM 0.5 MG PO TABS: 0.5000 mg | ORAL_TABLET | Freq: Two times a day (BID) | ORAL | 1 refills | Status: DC

## 2018-04-09 NOTE — Addendum Note (Signed)
Addended by: Kathrynn Ducking on: 04/09/2018 01:07 PM   Modules accepted: Orders

## 2018-04-09 NOTE — Telephone Encounter (Signed)
The clonazepam prescription will be sent in. 

## 2018-04-09 NOTE — Telephone Encounter (Signed)
Pt mother([name]) has called for a refill on clonazePAM (KLONOPIN) 0.5 MG tablet [place] [place] - [address] [phone] (Phone) [phone] (Fax)

## 2018-05-06 DIAGNOSIS — Z23 Encounter for immunization: Secondary | ICD-10-CM | POA: Diagnosis not present

## 2018-05-07 ENCOUNTER — Other Ambulatory Visit (HOSPITAL_COMMUNITY): Payer: Self-pay

## 2018-05-07 DIAGNOSIS — D696 Thrombocytopenia, unspecified: Secondary | ICD-10-CM

## 2018-05-08 ENCOUNTER — Other Ambulatory Visit (HOSPITAL_COMMUNITY): Payer: Medicare Other

## 2018-05-08 ENCOUNTER — Ambulatory Visit (HOSPITAL_COMMUNITY): Payer: Medicare Other | Admitting: Internal Medicine

## 2018-05-25 DIAGNOSIS — Z6823 Body mass index (BMI) 23.0-23.9, adult: Secondary | ICD-10-CM | POA: Diagnosis not present

## 2018-05-25 DIAGNOSIS — M8588 Other specified disorders of bone density and structure, other site: Secondary | ICD-10-CM | POA: Diagnosis not present

## 2018-05-25 DIAGNOSIS — Z01419 Encounter for gynecological examination (general) (routine) without abnormal findings: Secondary | ICD-10-CM | POA: Diagnosis not present

## 2018-05-25 DIAGNOSIS — N958 Other specified menopausal and perimenopausal disorders: Secondary | ICD-10-CM | POA: Diagnosis not present

## 2018-05-29 ENCOUNTER — Other Ambulatory Visit: Payer: Self-pay | Admitting: Neurology

## 2018-05-29 DIAGNOSIS — R569 Unspecified convulsions: Secondary | ICD-10-CM

## 2018-05-29 MED ORDER — LACOSAMIDE 200 MG PO TABS: 200.0000 mg | ORAL_TABLET | Freq: Two times a day (BID) | ORAL | 3 refills | Status: DC

## 2018-05-29 NOTE — Telephone Encounter (Signed)
Pts mother (jean on [place]) requesting refills for lacosamide (VIMPAT) 200 MG TABS [facility]

## 2018-05-29 NOTE — Telephone Encounter (Signed)
Pt is up to date on her appts. Her refill on vimpat is due on 06/19/18 but she is using a mail order pharmacy. Dr. Jannifer Franklin is out of the office, will send to Dr. Felecia Shelling, [facility], for review.

## 2018-06-16 ENCOUNTER — Other Ambulatory Visit (HOSPITAL_COMMUNITY): Payer: Self-pay | Admitting: Obstetrics and Gynecology

## 2018-06-16 DIAGNOSIS — Z1231 Encounter for screening mammogram for malignant neoplasm of breast: Secondary | ICD-10-CM

## 2018-07-01 DIAGNOSIS — L218 Other seborrheic dermatitis: Secondary | ICD-10-CM | POA: Diagnosis not present

## 2018-07-05 DIAGNOSIS — Z1211 Encounter for screening for malignant neoplasm of colon: Secondary | ICD-10-CM | POA: Diagnosis not present

## 2018-07-05 DIAGNOSIS — Z1212 Encounter for screening for malignant neoplasm of rectum: Secondary | ICD-10-CM | POA: Diagnosis not present

## 2018-07-06 ENCOUNTER — Ambulatory Visit (HOSPITAL_COMMUNITY)
Admission: RE | Admit: 2018-07-06 | Discharge: 2018-07-06 | Disposition: A | Discharge status: Home / Self Care | Payer: Medicare Other | Source: Ambulatory Visit | Attending: Obstetrics and Gynecology | Admitting: Obstetrics and Gynecology

## 2018-07-06 DIAGNOSIS — Z1231 Encounter for screening mammogram for malignant neoplasm of breast: Secondary | ICD-10-CM | POA: Insufficient documentation

## 2018-07-09 LAB — COLOGUARD: Cologuard: POSITIVE — AB

## 2018-07-20 ENCOUNTER — Telehealth: Payer: Self-pay | Admitting: Neurology

## 2018-07-20 DIAGNOSIS — R569 Unspecified convulsions: Secondary | ICD-10-CM

## 2018-07-20 MED ORDER — DIVALPROEX SODIUM ER 500 MG PO TB24: 500.0000 mg | ORAL_TABLET | Freq: Two times a day (BID) | ORAL | 3 refills | Status: DC

## 2018-07-20 NOTE — Telephone Encounter (Signed)
Reviewed pt's chart. Rx is appropriate. Refill submitted electronically and confirmation received.

## 2018-07-20 NOTE — Telephone Encounter (Signed)
Pt mother is asking for a new script for pt's divalproex (DEPAKOTE ER) 500 MG 24 hr tablet be sent to  [place]

## 2018-07-20 NOTE — Addendum Note (Signed)
Addended by: Verlin Grills T on: 07/20/2018 01:21 PM   Modules accepted: Orders

## 2018-07-25 ENCOUNTER — Encounter (HOSPITAL_COMMUNITY): Payer: Self-pay | Admitting: Emergency Medicine

## 2018-07-25 ENCOUNTER — Emergency Department (HOSPITAL_COMMUNITY): Payer: Medicare Other

## 2018-07-25 ENCOUNTER — Emergency Department (HOSPITAL_COMMUNITY)
Admission: EM | Admit: 2018-07-25 | Discharge: 2018-07-25 | Disposition: A | Discharge status: Home / Self Care | Payer: Medicare Other | Attending: Emergency Medicine | Admitting: Emergency Medicine

## 2018-07-25 DIAGNOSIS — F79 Unspecified intellectual disabilities: Secondary | ICD-10-CM | POA: Insufficient documentation

## 2018-07-25 DIAGNOSIS — Z87891 Personal history of nicotine dependence: Secondary | ICD-10-CM | POA: Diagnosis not present

## 2018-07-25 DIAGNOSIS — W010XXA Fall on same level from slipping, tripping and stumbling without subsequent striking against object, initial encounter: Secondary | ICD-10-CM | POA: Diagnosis not present

## 2018-07-25 DIAGNOSIS — S8002XA Contusion of left knee, initial encounter: Secondary | ICD-10-CM | POA: Diagnosis not present

## 2018-07-25 DIAGNOSIS — M79662 Pain in left lower leg: Secondary | ICD-10-CM | POA: Diagnosis not present

## 2018-07-25 DIAGNOSIS — Z79899 Other long term (current) drug therapy: Secondary | ICD-10-CM | POA: Insufficient documentation

## 2018-07-25 DIAGNOSIS — S8992XA Unspecified injury of left lower leg, initial encounter: Secondary | ICD-10-CM | POA: Diagnosis not present

## 2018-07-25 DIAGNOSIS — Y929 Unspecified place or not applicable: Secondary | ICD-10-CM | POA: Insufficient documentation

## 2018-07-25 DIAGNOSIS — R52 Pain, unspecified: Secondary | ICD-10-CM | POA: Diagnosis not present

## 2018-07-25 DIAGNOSIS — M25562 Pain in left knee: Secondary | ICD-10-CM | POA: Diagnosis not present

## 2018-07-25 DIAGNOSIS — Y9389 Activity, other specified: Secondary | ICD-10-CM | POA: Insufficient documentation

## 2018-07-25 DIAGNOSIS — Y998 Other external cause status: Secondary | ICD-10-CM | POA: Diagnosis not present

## 2018-07-25 DIAGNOSIS — S80919A Unspecified superficial injury of unspecified knee, initial encounter: Secondary | ICD-10-CM | POA: Diagnosis not present

## 2018-07-25 DIAGNOSIS — W19XXXA Unspecified fall, initial encounter: Secondary | ICD-10-CM

## 2018-07-25 DIAGNOSIS — R569 Unspecified convulsions: Secondary | ICD-10-CM | POA: Diagnosis not present

## 2018-07-25 DIAGNOSIS — R404 Transient alteration of awareness: Secondary | ICD-10-CM | POA: Diagnosis not present

## 2018-07-25 DIAGNOSIS — I4510 Unspecified right bundle-branch block: Secondary | ICD-10-CM | POA: Diagnosis not present

## 2018-07-25 MED ORDER — ACETAMINOPHEN 325 MG PO TABS
650.0000 mg | ORAL_TABLET | Freq: Once | ORAL | Status: AC
  Administered 2018-07-25: 650 mg via ORAL
  Filled 2018-07-25: qty 2

## 2018-07-25 NOTE — Discharge Instructions (Addendum)
You can use ice on sore areas for 2 to 3 days, several times each day.  Use the Ace wrap as needed for comfort.  For pain, take Tylenol 650 mg every 4 hours.  Return here, if needed, for problems.
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
## 2018-07-25 NOTE — ED Notes (Signed)
Ambulated in rm per request Dr. Eulis Foster.  Pt c/o pain in left knee and lower back. Family states she "has a high pain tolerance".   Pt shuffled feet. Did c/o soreness in both back and knee.  No pain when palpating both knee and back.

## 2018-07-25 NOTE — ED Provider Notes (Signed)
[facility] EMERGENCY DEPARTMENT Provider Note   CSN: [id number] Arrival date & time: 07/25/18  1217    History   Chief Complaint Chief Complaint  Patient presents with  . Altered Mental Status    HPI Maria [name] is a 58 y.o. female.  History from patient and mother.     HPI   Patient presents by EMS for evaluation after she, fell to the floor while arguing with her mother.  Patient has mental retardation.  This morning she and her mother were verbally arguing.  There was no distinct injury known when she fell to the floor.  Patient states she has not yet eaten this morning.  She complains of pain in her left thigh.  There are no other reports that she has injured herself.  There have been no recent illnesses.  Past Medical History:  Diagnosis Date  . Constipation   . Dysphagia   . Fracture    R foot  . GERD (gastroesophageal reflux disease)   . History of shingles 10/2016  . Mental retardation    lesion in head  . Seizures ([place])    "not fully controlled on max doses of meds" (Neuro ofc note 12/2014)  . Thrombocytopenia ([place])   . Tremor     Patient Active Problem List   Diagnosis Date Noted  . Posterior tibial tendinitis, right leg 04/23/2016  . Pain in right foot 04/12/2016  . Acute encephalopathy   . Myoclonus   . Frequent falls 11/09/2015  . Fall 11/09/2015  . Chest pain 03/16/2014  . Upper abdominal pain 03/16/2014  . Seizure disorder ([place]) 03/16/2014  . 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 Generalized convulsive epilepsy ([place]) 10/06/2012  . Encounter for therapeutic drug monitoring 10/06/2012  . Mental retardation   . Thrombocytopenia ([place]) 05/23/2011  . Seizures ([place]) 03/21/2011  . GERD (gastroesophageal reflux disease) 03/21/2011  . CLOSED FRACTURE OF ACROMIAL END OF CLAVICLE 07/13/2008    Past Surgical History:  Procedure Laterality Date  . BIOPSY  09/16/2014   Procedure: BIOPSY;  Surgeon: Rogene Houston, MD;  Location: [facility];   Service: Endoscopy;;  . CATARACT EXTRACTION     both eyes, May of 2015  . ESOPHAGEAL DILATION N/A 09/16/2014   Procedure: ESOPHAGEAL DILATION WITH 54FR MALONEY DILATOR;  Surgeon: Rogene Houston, MD;  Location: [facility] ORS;  Service: Endoscopy;  Laterality: N/A;  . ESOPHAGOGASTRODUODENOSCOPY (EGD) WITH PROPOFOL N/A 09/16/2014   Procedure: ESOPHAGOGASTRODUODENOSCOPY (EGD) WITH PROPOFOL;  Surgeon: Rogene Houston, MD;  Location: [facility];  Service: Endoscopy;  Laterality: N/A;  . MOUTH SURGERY    . Skin graft to gum Right 08/2013  . TONSILLECTOMY AND ADENOIDECTOMY    . TOTAL ABDOMINAL HYSTERECTOMY       OB History   No obstetric history on file.      Home Medications    Prior to Admission medications   Medication Sig Start Date End Date Taking? Authorizing Provider  Calcium Carb-Cholecalciferol (CALCIUM 500 +D) 500-400 MG-UNIT TABS Take 1 tablet by mouth 2 (two) times daily.    [provider]  Calcium Carbonate Antacid (TUMS PO) Take 1 tablet by mouth daily as needed (heartburn).     [provider]  cholecalciferol (VITAMIN D) 1000 UNITS tablet Take 1,000 Units by mouth daily. Reported on 05/30/2015    [provider]  clonazePAM (KLONOPIN) 0.5 MG tablet Take 1 tablet (0.5 mg total) by mouth 2 (two) times daily. 04/09/18   Kathrynn Ducking,  MD  divalproex (DEPAKOTE ER) 500 MG 24 hr tablet Take 1 tablet (500 mg total) by mouth 2 (two) times daily. 07/20/18   Kathrynn Ducking, MD  docusate sodium (COLACE) 100 MG capsule Take 100 mg by mouth daily as needed.     [provider]  estradiol (VIVELLE-DOT) 0.075 MG/24HR Place 1 patch onto the skin 2 (two) times a week. On Wednesday and Saturday    [provider]  feeding supplement, ENSURE ENLIVE, (ENSURE ENLIVE) LIQD Take 237 mLs by mouth 2 (two) times daily between meals. 11/13/15   Robbie Lis, MD  ibuprofen (ADVIL,MOTRIN) 600 MG tablet Take 1 tablet (600 mg total) by mouth 4 (four) times  daily. Patient taking differently: Take 600 mg by mouth as needed.  09/04/17   Lily Kocher, PA-C  lacosamide (VIMPAT) 200 MG TABS tablet Take 1 tablet (200 mg total) by mouth 2 (two) times daily. 05/29/18   Sater, Nanine Means, MD  levocetirizine (XYZAL) 5 MG tablet Take 5 mg by mouth every evening.     [provider]  omeprazole (PRILOSEC) 40 MG capsule TAKE 1 CAPSULE DAILY 04/09/18   Setzer, Rona Ravens, NP  oxybutynin (DITROPAN-XL) 5 MG 24 hr tablet Take 5 mg by mouth daily.    [provider]  Pediatric Multiple Vit-C-FA (PEDIATRIC MULTIVITAMIN) chewable tablet Chew 1 tablet by mouth daily.      [provider]    Family History Family History  Problem Relation Age of Onset  . High Cholesterol Mother   . High blood pressure Mother   . Diabetes Father     Social History Social History   Tobacco Use  . Smoking status: Former Smoker    Packs/day: 1.50    Years: 15.00    Pack years: 22.50    Types: Cigarettes    Last attempt to quit: 05/22/2006    Years since quitting: 12.1  . Smokeless tobacco: Never Used  Substance Use Topics  . Alcohol use: No    Alcohol/week: 0.0 standard drinks  . Drug use: No     Allergies   Codeine and Lamictal [lamotrigine]   Review of Systems Review of Systems  All other systems reviewed and are negative.    Physical Exam Updated Vital Signs BP 123/66   Pulse 81   Temp 97.9 F (36.6 C) (Oral)   Resp 19   Ht 5\' 6"  (1.676 m)   Wt 63 kg   SpO2 99%   BMI 22.42 kg/m   Physical Exam Vitals signs and nursing note reviewed.  Constitutional:      General: She is not in acute distress.    Appearance: Normal appearance. She is well-developed and normal weight. She is not ill-appearing or diaphoretic.  HENT:     Head: Normocephalic and atraumatic.  Eyes:     Conjunctiva/sclera: Conjunctivae normal.     Pupils: Pupils are equal, round, and reactive to light.  Neck:     Musculoskeletal: Normal range of motion and  neck supple.     Trachea: Phonation normal.  Cardiovascular:     Rate and Rhythm: Normal rate and regular rhythm.  Pulmonary:     Effort: Pulmonary effort is normal.     Breath sounds: Normal breath sounds.  Chest:     Chest wall: No tenderness.  Abdominal:     General: There is no distension.     Palpations: Abdomen is soft.     Tenderness: There is no abdominal tenderness. There is  no guarding.  Musculoskeletal: Normal range of motion.        General: No swelling or tenderness.     Comments: No visible injury to arms or legs bilaterally.  Skin:    General: Skin is warm and dry.  Neurological:     Mental Status: She is alert and oriented to person, place, and time.     Motor: No abnormal muscle tone.  Psychiatric:        Mood and Affect: Mood normal.        Behavior: Behavior normal.      ED Treatments / Results  Labs (all labs ordered are listed, but only abnormal results are displayed) Labs Reviewed - No data to display  EKG EKG Interpretation  Date/Time:  Saturday July 25 2018 12:27:35 EST Ventricular Rate:  73 PR Interval:    QRS Duration: 126 QT Interval:  411 QTC Calculation: 453 R Axis:   87 Text Interpretation:  Sinus rhythm Right bundle branch block since last tracing no significant change Confirmed by Daleen Bo [phone]) on 07/25/2018 12:39:38 PM   Radiology Dg Tibia/fibula Left  Result Date: 07/25/2018 CLINICAL DATA:  Left knee pain and lower leg pain.  Fall. EXAM: LEFT TIBIA AND FIBULA - 2 VIEW COMPARISON:  None. FINDINGS: There is no evidence of fracture or other focal bone lesions. Soft tissues are unremarkable. IMPRESSION: Negative. Electronically Signed   By: Rolm Baptise M.D.   On: 07/25/2018 15:13   Dg Knee Complete 4 Views Left  Result Date: 07/25/2018 CLINICAL DATA:  Fall, left knee and lower leg pain EXAM: LEFT KNEE - COMPLETE 4+ VIEW COMPARISON:  01/15/2015 FINDINGS: No evidence of fracture, dislocation, or joint effusion. No evidence  of arthropathy or other focal bone abnormality. Soft tissues are unremarkable. IMPRESSION: Negative. Electronically Signed   By: Rolm Baptise M.D.   On: 07/25/2018 15:13    Procedures Procedures (including critical care time)  Medications Ordered in ED Medications  acetaminophen (TYLENOL) tablet 650 mg (has no administration in time range)     Initial Impression / Assessment and Plan / ED Course  I have reviewed the triage vital signs and the nursing notes.  Pertinent labs & imaging results that were available during my care of the patient were reviewed by me and considered in my medical decision making (see chart for details).  Clinical Course as of Jul 25 1530  Sat Jul 25, 2018  1412 Family members, sister and her mother watched the patient ambulate.  She walked with a shuffling gait, and was able to extend her back while walking.  Family members are concerned that something is injured in her left knee and they desire an x-ray to have that evaluated.   [EW]  1529 No fracture, images reviewed by me  DG Tibia/Fibula Left [EW]  1529 No fracture, images reviewed by me  DG Knee Complete 4 Views Left [EW]    Clinical Course User Index [EW] Daleen Bo, MD        Patient Vitals for the past 24 hrs:  BP Temp Temp src Pulse Resp SpO2 Height Weight  07/25/18 1330 123/66 - - 81 19 99 % - -  07/25/18 1227 122/75 97.9 F (36.6 C) Oral 75 18 100 % - -  07/25/18 1226 - - - - - - 5\' 6"  (1.676 m) 63 kg    3:29 PM Reevaluation with update and discussion. After initial assessment and treatment, an updated evaluation reveals no change in clinical status.  Findings discussed with patient's sister and mother, all questions answered. Daleen Bo   Medical Decision Making: Fall with injury to left knee.  No evident fracture.  Suspect contusion versus sprain.  No indication for further ED evaluation or hospitalization at this time.  Patient with baseline mental retardation, apparently fell  during a verbal altercation with her mother.  She has been able to walk here, with a shuffling gait.  She is bearing weight fully on both feet.  CRITICAL CARE-no Performed by: Daleen Bo   Nursing Notes Reviewed/ Care Coordinated Applicable Imaging Reviewed Interpretation of Laboratory Data incorporated into ED treatment  The patient appears reasonably screened and/or stabilized for discharge and I doubt any other medical condition or other [facility] requiring further screening, evaluation, or treatment in the ED at this time prior to discharge.  Plan: Home Medications-Tylenol for pain, continue usual medication; Home Treatments-Ace wrap knee for comfort; return here if the recommended treatment, does not improve the symptoms; Recommended follow up-PCP 1 week and as needed     Final Clinical Impressions(s) / ED Diagnoses   Final diagnoses:  Fall, initial encounter  Contusion of left knee, initial encounter    ED Discharge Orders    None       Daleen Bo, MD 07/25/18 [phone]

## 2018-08-13 ENCOUNTER — Other Ambulatory Visit: Payer: Self-pay

## 2018-08-13 ENCOUNTER — Ambulatory Visit (INDEPENDENT_AMBULATORY_CARE_PROVIDER_SITE_OTHER): Payer: Medicare Other | Admitting: Surgery

## 2018-08-13 ENCOUNTER — Ambulatory Visit (INDEPENDENT_AMBULATORY_CARE_PROVIDER_SITE_OTHER): Payer: Self-pay

## 2018-08-13 ENCOUNTER — Encounter (INDEPENDENT_AMBULATORY_CARE_PROVIDER_SITE_OTHER): Payer: Self-pay | Admitting: Surgery

## 2018-08-13 DIAGNOSIS — Z9181 History of falling: Secondary | ICD-10-CM

## 2018-08-13 DIAGNOSIS — M1712 Unilateral primary osteoarthritis, left knee: Secondary | ICD-10-CM | POA: Diagnosis not present

## 2018-08-13 DIAGNOSIS — M25562 Pain in left knee: Secondary | ICD-10-CM

## 2018-08-13 DIAGNOSIS — Z8669 Personal history of other diseases of the nervous system and sense organs: Secondary | ICD-10-CM | POA: Diagnosis not present

## 2018-08-13 NOTE — Progress Notes (Signed)
Office Visit Note   Patient: Maria [name]           Date of Birth: [birth year]           MRN: [id number] Visit Date: 08/13/2018              Requested by: Celene Squibb, MD [address] [place], [place] [address] PCP: Celene Squibb, MD   Assessment & Plan: Visit Diagnoses:  1. Left knee pain, unspecified chronicity   2. Arthritis of left knee     Plan: Today offered conservative management with left knee intra-articular Marcaine/Depo-Medrol injection but patient and mother declined.  Recommended icing the knee off and on but patient stated that ice hurts her when she does this.  She prefers to use heat.  We will follow-up with me as needed.  If knee continues to be bothersome in a few weeks she can return and I will plan to inject the knee at that time.  They voiced understanding.  Follow-Up Instructions: Return if symptoms worsen or fail to improve.   Orders:  Orders Placed This Encounter  Procedures  . XR KNEE 3 VIEW LEFT   No orders of the defined types were placed in this encounter.     Procedures: No procedures performed   Clinical Data: No additional findings.   Subjective: Chief Complaint  Patient presents with  . Left Knee - Follow-up    HPI 58 year old white female comes in today with complaints of left knee pain.  Over the last few weeks patient has had a couple of falls per her mother and the most recent July 25, 2018 required an emergency room visit.  Patient has history of seizures and has been followed by Dr. Jannifer [place] for this.  Patient is a poor historian and mother gives history.  Mother states that she is not sure if the fall was related to his seizure.  States that patient fell down directly onto both knees.  She did have x-rays in the emergency room and these were negative for fracture.  She has been using heat on the left knee off and on.  She does not like to use ice.  She does point to pain in the left patellofemoral region. Review of  Systems No current cardiac pulmonary GI GU issues  Objective: Vital Signs: There were no vitals taken for this visit.  Physical Exam HENT:     Head: Normocephalic and atraumatic.     Nose: Nose normal.     Mouth/Throat:     Mouth: Mucous membranes are dry.  Pulmonary:     Effort: Pulmonary effort is normal. No respiratory distress.  Musculoskeletal:     Comments: Gait is somewhat antalgic.  Left knee good range of motion.  Minimal swelling without significant effusion.  Mild patellofemoral crepitus.  Positive patellar grind.  Medial joint line tender.  Ligaments are stable.  Calf nontender.  Neurovascular intact.  Skin:    General: Skin is warm and dry.     Ortho Exam  Specialty Comments:  No specialty comments available.  Imaging: No results found.   PMFS History: Patient Active Problem List   Diagnosis Date Noted  . Posterior tibial tendinitis, right leg 04/23/2016  . Pain in right foot 04/12/2016  . Acute encephalopathy   . Myoclonus   . Frequent falls 11/09/2015  . Fall 11/09/2015  . Chest pain 03/16/2014  . Upper abdominal pain 03/16/2014  . Seizure disorder ([place]) 03/16/2014  . Bone  fibrous dysplasia of skull 12/17/2012  . Generalized convulsive epilepsy ([place]) 10/06/2012  . Encounter for therapeutic drug monitoring 10/06/2012  . Mental retardation   . Thrombocytopenia ([place]) 05/23/2011  . Seizures ([place]) 03/21/2011  . GERD (gastroesophageal reflux disease) 03/21/2011  . CLOSED FRACTURE OF ACROMIAL END OF CLAVICLE 07/13/2008   Past Medical History:  Diagnosis Date  . Constipation   . Dysphagia   . Fracture    R foot  . GERD (gastroesophageal reflux disease)   . History of shingles 10/2016  . Mental retardation    lesion in head  . Seizures ([place])    "not fully controlled on max doses of meds" (Neuro ofc note 12/2014)  . Thrombocytopenia ([place])   . Tremor     Family History  Problem Relation Age of Onset  . High Cholesterol Mother   . High blood  pressure Mother   . Diabetes Father     Past Surgical History:  Procedure Laterality Date  . BIOPSY  09/16/2014   Procedure: BIOPSY;  Surgeon: Rogene Houston, MD;  Location: [facility];  Service: Endoscopy;;  . CATARACT EXTRACTION     both eyes, May of 2015  . ESOPHAGEAL DILATION N/A 09/16/2014   Procedure: ESOPHAGEAL DILATION WITH 54FR MALONEY DILATOR;  Surgeon: Rogene Houston, MD;  Location: [facility];  Service: Endoscopy;  Laterality: N/A;  . ESOPHAGOGASTRODUODENOSCOPY (EGD) WITH PROPOFOL N/A 09/16/2014   Procedure: ESOPHAGOGASTRODUODENOSCOPY (EGD) WITH PROPOFOL;  Surgeon: Rogene Houston, MD;  Location: [facility];  Service: Endoscopy;  Laterality: N/A;  . MOUTH SURGERY    . Skin graft to gum Right 08/2013  . TONSILLECTOMY AND ADENOIDECTOMY    . TOTAL ABDOMINAL HYSTERECTOMY     Social History   Occupational History    Employer: UNEMPLOYED  Tobacco Use  . Smoking status: Former Smoker    Packs/day: 1.50    Years: 15.00    Pack years: 22.50    Types: Cigarettes    Last attempt to quit: 05/22/2006    Years since quitting: 12.2  . Smokeless tobacco: Never Used  Substance and Sexual Activity  . Alcohol use: No    Alcohol/week: 0.0 standard drinks  . Drug use: No  . Sexual activity: Never    Birth control/protection: Other-see comments    Comment: Hysterectomy

## 2018-09-08 DIAGNOSIS — G40909 Epilepsy, unspecified, not intractable, without status epilepticus: Secondary | ICD-10-CM | POA: Diagnosis not present

## 2018-09-08 DIAGNOSIS — K219 Gastro-esophageal reflux disease without esophagitis: Secondary | ICD-10-CM | POA: Diagnosis not present

## 2018-09-08 DIAGNOSIS — N3281 Overactive bladder: Secondary | ICD-10-CM | POA: Diagnosis not present

## 2018-09-08 DIAGNOSIS — E782 Mixed hyperlipidemia: Secondary | ICD-10-CM | POA: Diagnosis not present

## 2018-09-28 ENCOUNTER — Encounter (INDEPENDENT_AMBULATORY_CARE_PROVIDER_SITE_OTHER): Payer: Self-pay | Admitting: Internal Medicine

## 2018-09-28 ENCOUNTER — Other Ambulatory Visit: Payer: Self-pay

## 2018-09-28 ENCOUNTER — Ambulatory Visit (INDEPENDENT_AMBULATORY_CARE_PROVIDER_SITE_OTHER): Payer: Medicare Other | Admitting: Internal Medicine

## 2018-09-28 VITALS — BP 147/78 | HR 77 | Temp 98.4°F | Ht 64.0 in | Wt 148.4 lb

## 2018-09-28 DIAGNOSIS — K219 Gastro-esophageal reflux disease without esophagitis: Secondary | ICD-10-CM | POA: Diagnosis not present

## 2018-09-28 NOTE — Progress Notes (Signed)
Subjective:    Patient ID: Maria [name], female    DOB: Sep 01, 1960, 58 y.o.   MRN: [id number]  HPI Here today for f/u. Last seen in April of 2019. Lx of chronic GERD. Maintained of Omeprazole.      Underwent an EGD in 2016 for chronic GERD/dysphagia. Hx of Schatzki's ring. Has been dilated x 4 in the past.  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 Noncritical Schatzki's ring and small sliding hiatal hernia. Small hyperplastic appearing polyps at gastric body. These were left alone. Erosive gastritis with small gastric ulcer at antrum. Esophagus dilated by passing 44 French Maloney dilator resulting in linear disruption at GE junction and also it proximal esophagus indicative of disrupted web. Biopsy revealed benign ulcer and H. Pylori stain was negative. Sister is in room. She is not having any GI complaints. Appetite is good. Has gained 8 pounds since her last visit. She enjoys her snack.    Review of Systems Past Medical History:  Diagnosis Date  . Constipation   . Dysphagia   . Fracture    R foot  . GERD (gastroesophageal reflux disease)   . History of shingles 10/2016  . Mental retardation    lesion in head  . Seizures ([place])    "not fully controlled on max doses of meds" (Neuro ofc note 12/2014)  . Thrombocytopenia ([place])   . Tremor     Past Surgical History:  Procedure Laterality Date  . BIOPSY  09/16/2014   Procedure: BIOPSY;  Surgeon: Rogene Houston, MD;  Location: [facility];  Service: Endoscopy;;  . CATARACT EXTRACTION     both eyes, May of 2015  . ESOPHAGEAL DILATION N/A 09/16/2014   Procedure: ESOPHAGEAL DILATION WITH 54FR MALONEY DILATOR;  Surgeon: Rogene Houston, MD;  Location: [facility];  Service: Endoscopy;  Laterality: N/A;  . ESOPHAGOGASTRODUODENOSCOPY (EGD) WITH PROPOFOL N/A 09/16/2014   Procedure: ESOPHAGOGASTRODUODENOSCOPY (EGD) WITH PROPOFOL;  Surgeon: Rogene Houston, MD;  Location: [facility];  Service: Endoscopy;   Laterality: N/A;  . MOUTH SURGERY    . Skin graft to gum Right 08/2013  . TONSILLECTOMY AND ADENOIDECTOMY    . TOTAL ABDOMINAL HYSTERECTOMY      Allergies  Allergen Reactions  . Codeine Nausea And Vomiting  . Lamictal [Lamotrigine] Rash    Current Outpatient Medications on File Prior to Visit  Medication Sig Dispense Refill  . Calcium Carb-Cholecalciferol (CALCIUM 500 +D) 500-400 MG-UNIT TABS Take 1 tablet by mouth 2 (two) times daily.    . Calcium Carbonate Antacid (TUMS PO) Take 1 tablet by mouth daily as needed (heartburn).     . cholecalciferol (VITAMIN D) 1000 UNITS tablet Take 1,000 Units by mouth daily. Reported on 05/30/2015    . clonazePAM (KLONOPIN) 0.5 MG tablet Take 1 tablet (0.5 mg total) by mouth 2 (two) times daily. 180 tablet 1  . divalproex (DEPAKOTE ER) 500 MG 24 hr tablet Take 1 tablet (500 mg total) by mouth 2 (two) times daily. 180 tablet 3  . docusate sodium (COLACE) 100 MG capsule Take 100 mg by mouth daily as needed.     Marland Kitchen estradiol (VIVELLE-DOT) 0.075 MG/24HR Place 1 patch onto the skin 2 (two) times a week. On Wednesday and Saturday    . feeding supplement, ENSURE ENLIVE, (ENSURE ENLIVE) LIQD Take 237 mLs by mouth 2 (two) times daily between meals. 237 mL 12  . ibuprofen (ADVIL,MOTRIN) 600 MG tablet Take 1 tablet (600 mg total) by mouth 4 (  four) times daily. (Patient taking differently: Take 600 mg by mouth as needed. ) 30 tablet 0  . lacosamide (VIMPAT) 200 MG TABS tablet Take 1 tablet (200 mg total) by mouth 2 (two) times daily. 180 tablet 3  . levocetirizine (XYZAL) 5 MG tablet Take 5 mg by mouth every evening.     Marland Kitchen omeprazole (PRILOSEC) 40 MG capsule TAKE 1 CAPSULE DAILY 90 capsule 4  . oxybutynin (DITROPAN-XL) 5 MG 24 hr tablet Take 5 mg by mouth daily.    . Pediatric Multiple Vit-C-FA (PEDIATRIC MULTIVITAMIN) chewable tablet Chew 1 tablet by mouth daily.       No current facility-administered medications on file prior to visit.         Objective:    Physical Exam Blood pressure (!) 147/78, pulse 77, temperature 98.4 F (36.9 C), height 5\' 4"  (1.626 m), weight 148 lb 6.4 oz (67.3 kg). Alert and oriented. Skin warm and dry. Oral mucosa is moist.   . Sclera anicteric, conjunctivae is pink. Thyroid not enlarged. No cervical lymphadenopathy. Lungs clear. Heart regular rate and rhythm.  Abdomen is soft. Bowel sounds are positive. No hepatomegaly. No abdominal masses felt. No tenderness.  No edema to lower extremities. Patient is alert and oriented.        Assessment & Plan:  GERD. She is doing well. She will continue the Omeprazole. OV in 1 year.

## 2018-09-28 NOTE — Patient Instructions (Signed)
Continue the Omeprazole.  OV in 1 year.

## 2018-10-07 DIAGNOSIS — G40909 Epilepsy, unspecified, not intractable, without status epilepticus: Secondary | ICD-10-CM | POA: Diagnosis not present

## 2018-10-07 DIAGNOSIS — K219 Gastro-esophageal reflux disease without esophagitis: Secondary | ICD-10-CM | POA: Diagnosis not present

## 2018-10-07 DIAGNOSIS — N3281 Overactive bladder: Secondary | ICD-10-CM | POA: Diagnosis not present

## 2018-10-07 DIAGNOSIS — E782 Mixed hyperlipidemia: Secondary | ICD-10-CM | POA: Diagnosis not present

## 2018-10-07 DIAGNOSIS — Z8669 Personal history of other diseases of the nervous system and sense organs: Secondary | ICD-10-CM | POA: Diagnosis not present

## 2018-10-07 DIAGNOSIS — Z0001 Encounter for general adult medical examination with abnormal findings: Secondary | ICD-10-CM | POA: Diagnosis not present

## 2018-10-07 DIAGNOSIS — Z6826 Body mass index (BMI) 26.0-26.9, adult: Secondary | ICD-10-CM | POA: Diagnosis not present

## 2018-10-07 DIAGNOSIS — J309 Allergic rhinitis, unspecified: Secondary | ICD-10-CM | POA: Diagnosis not present

## 2018-10-07 DIAGNOSIS — R569 Unspecified convulsions: Secondary | ICD-10-CM | POA: Diagnosis not present

## 2018-10-12 ENCOUNTER — Telehealth: Payer: Self-pay | Admitting: Neurology

## 2018-10-12 DIAGNOSIS — R569 Unspecified convulsions: Secondary | ICD-10-CM

## 2018-10-12 MED ORDER — CLONAZEPAM 0.5 MG PO TABS: 0.5000 mg | ORAL_TABLET | Freq: Two times a day (BID) | ORAL | 1 refills | Status: DC

## 2018-10-12 NOTE — Telephone Encounter (Signed)
The clonazepam will be called in.

## 2018-10-12 NOTE — Telephone Encounter (Signed)
Pt mother has called for a refill on her clonazePAM (KLONOPIN) 0.5 MG tablet [facility]

## 2018-10-12 NOTE — Addendum Note (Signed)
Addended by: Kathrynn Ducking on: 10/12/2018 12:08 PM   Modules accepted: Orders

## 2018-10-12 NOTE — Telephone Encounter (Signed)
[place] drug registry checked. Last refill was 07/19/2018 # 180 for a 90 day supply.

## 2018-10-15 ENCOUNTER — Telehealth: Payer: Self-pay

## 2018-10-15 DIAGNOSIS — D696 Thrombocytopenia, unspecified: Secondary | ICD-10-CM | POA: Diagnosis not present

## 2018-10-15 DIAGNOSIS — M25562 Pain in left knee: Secondary | ICD-10-CM | POA: Diagnosis not present

## 2018-10-15 DIAGNOSIS — G40909 Epilepsy, unspecified, not intractable, without status epilepticus: Secondary | ICD-10-CM | POA: Diagnosis not present

## 2018-10-15 DIAGNOSIS — N3281 Overactive bladder: Secondary | ICD-10-CM | POA: Diagnosis not present

## 2018-10-15 DIAGNOSIS — E782 Mixed hyperlipidemia: Secondary | ICD-10-CM | POA: Diagnosis not present

## 2018-10-15 DIAGNOSIS — K219 Gastro-esophageal reflux disease without esophagitis: Secondary | ICD-10-CM | POA: Diagnosis not present

## 2018-10-15 NOTE — Telephone Encounter (Signed)
Spoke with the Romie Minus( pts mother and DPR verified) and she stated that they would be unable to do a doxy.me visit due to them only having a land line. She mentioned that all her other appts were done with a telephone visit. I advised her that I would have to speak with the NP and call her back with a answer. She was very Patent attorney. Please advise

## 2018-10-15 NOTE — Telephone Encounter (Signed)
Can offer in office or telephone.

## 2018-10-15 NOTE — Telephone Encounter (Signed)
Spoke with the patient's mother and she stated they would rather do a telephone visit due covid-19. She was very appreciative for the return phone call. Verbal consent was given to file her insurance.

## 2018-10-19 ENCOUNTER — Telehealth: Payer: Self-pay | Admitting: Neurology

## 2018-10-19 NOTE — Telephone Encounter (Signed)
Blood work recently done by the primary care physician on 07 Oct 2018 revealed a white blood count of 6.5, hemoglobin of 13.4, hematocrit 39.3, MCV of 91, platelets of 137.  Glucose was 91, BUN of 23, creatinine of 0.92, sodium 142, potassium 4.3, chloride 103, CO2 24, calcium of 9.9, total protein 6.7, albumin of 4.5, liver profile is unremarkable.

## 2018-10-20 ENCOUNTER — Ambulatory Visit (INDEPENDENT_AMBULATORY_CARE_PROVIDER_SITE_OTHER): Payer: Medicare Other | Admitting: Adult Health

## 2018-10-20 ENCOUNTER — Other Ambulatory Visit: Payer: Self-pay

## 2018-10-20 ENCOUNTER — Encounter: Payer: Self-pay | Admitting: Adult Health

## 2018-10-20 DIAGNOSIS — R569 Unspecified convulsions: Secondary | ICD-10-CM | POA: Diagnosis not present

## 2018-10-20 NOTE — Progress Notes (Signed)
I have read the note, and I agree with the clinical assessment and plan.  Maria [name]   

## 2018-10-20 NOTE — Progress Notes (Signed)
  [facility] [address] [place] [address] [phone]     Virtual Visit via Telephone Note  I connected with Maria [name] on 10/20/18 at  2:00 PM EDT by telephone located remotely at [facility] and verified that I am speaking with the correct person using two identifiers who reports being located at her home.    I discussed the limitations, risks, security and privacy concerns of performing an evaluation and management service by telephone and the availability of in person appointments. I also discussed with the patient that there may be a patient responsible charge related to this service. The patient expressed understanding and agreed to proceed. See telephone note for consent and additional scheduling information.    History of Present Illness:  Maria [name] is a 58 y.o. female who has been followed in this office for seizures . She was initially scheduled for face-to-face office follow up visit today time but due to [place], visit rescheduled for non-face-to-face telephone visit with patients consent. Unable to participate in video visit due to lack of access to device with camera.    Maria [name] is a 58 year old female with a history of seizures.  She was unable to do and did not feel comfortable coming to the office due to COVID-19.  We discussed her seizures over the phone.  I also spoke with her mother today with information as well.  The patient has not had any recent seizures.  She is doing well on clonazepam, Depakote and Vimpat.  She lives with her mother.  She is able to complete ADLs independently.  She does not operate a motor vehicle.  Denies any changes with her gait or balance.  Denies any new issues.     Observations/Objective:   Neurological examination  Mentation: Alert oriented to time, place, history taking. Speech and language fluent  Assessment and Plan:   1.  Seizures  Overall the patient is doing  well.  She will continue on clonazepam, Depakote, and Vimpat.  She reports that he recently had blood work through her primary care provider.  I have not seen these results.  Patient is advised that if she has any seizure event she should let us know.  We will follow-up in 1 year or sooner if needed.   Follow Up Instructions:   Follow-up in 1 year    I discussed the assessment and treatment plan with the patient.  The patient was provided an opportunity to ask questions and all were answered to their satisfaction. The patient agreed with the plan and verbalized an understanding of the instructions.   I provided 15 minutes of non-face-to-face time during this encounter.    Ward Givens, NP-C   [facility] [address] [place], [place] [address]  Phone [phone] Fax [phone] Note: This document was prepared with digital dictation and possible smart phrase technology. Any transcriptional errors that result from this process are unintentional.

## 2018-10-21 ENCOUNTER — Other Ambulatory Visit: Payer: Self-pay

## 2018-10-21 ENCOUNTER — Encounter (INDEPENDENT_AMBULATORY_CARE_PROVIDER_SITE_OTHER): Payer: Self-pay | Admitting: Internal Medicine

## 2018-10-21 ENCOUNTER — Ambulatory Visit (INDEPENDENT_AMBULATORY_CARE_PROVIDER_SITE_OTHER): Payer: Medicare Other | Admitting: Internal Medicine

## 2018-10-21 VITALS — BP 130/74 | HR 65 | Temp 97.4°F | Ht 65.0 in | Wt 148.4 lb

## 2018-10-21 DIAGNOSIS — R195 Other fecal abnormalities: Secondary | ICD-10-CM | POA: Diagnosis not present

## 2018-10-21 NOTE — Patient Instructions (Signed)
The risks of bleeding, perforation and infection were reviewed with patient.  

## 2018-10-21 NOTE — Progress Notes (Signed)
Subjective:    Patient ID: Maria [name], female    DOB: 1961/02/10, 58 y.o.   MRN: [id number] PCP Dr. Nevada Crane.  HPI Referred by Dr. Nevada Crane for a positive cologuard. There has been no weight loss. No change in her stools.  She does have constipation and has to strain.  Her appetite is good. No family hx of colon cancer.  Hx of seizures. Last seizure May, 2019.     Review of Systems Past Medical History:  Diagnosis Date  . Constipation   . Dysphagia   . Fracture    R foot  . GERD (gastroesophageal reflux disease)   . History of shingles 10/2016  . Mental retardation    lesion in head  . Seizures ([place])    "not fully controlled on max doses of meds" (Neuro ofc note 12/2014)  . Thrombocytopenia ([place])   . Tremor     Past Surgical History:  Procedure Laterality Date  . BIOPSY  09/16/2014   Procedure: BIOPSY;  Surgeon: Rogene Houston, MD;  Location: [facility];  Service: Endoscopy;;  . CATARACT EXTRACTION     both eyes, May of 2015  . ESOPHAGEAL DILATION N/A 09/16/2014   Procedure: ESOPHAGEAL DILATION WITH 54FR MALONEY DILATOR;  Surgeon: Rogene Houston, MD;  Location: [facility];  Service: Endoscopy;  Laterality: N/A;  . ESOPHAGOGASTRODUODENOSCOPY (EGD) WITH PROPOFOL N/A 09/16/2014   Procedure: ESOPHAGOGASTRODUODENOSCOPY (EGD) WITH PROPOFOL;  Surgeon: Rogene Houston, MD;  Location: [facility];  Service: Endoscopy;  Laterality: N/A;  . MOUTH SURGERY    . Skin graft to gum Right 08/2013  . TONSILLECTOMY AND ADENOIDECTOMY    . TOTAL ABDOMINAL HYSTERECTOMY      Allergies  Allergen Reactions  . Codeine Nausea And Vomiting  . Lamictal [Lamotrigine] Rash    Current Outpatient Medications on File Prior to Visit  Medication Sig Dispense Refill  . Calcium Carb-Cholecalciferol (CALCIUM 500 +D) 500-400 MG-UNIT TABS Take 1 tablet by mouth 2 (two) times daily.    . Calcium Carbonate Antacid (TUMS PO) Take 1 tablet by mouth daily as needed (heartburn).     . cholecalciferol (VITAMIN D) 1000 UNITS  tablet Take 1,000 Units by mouth daily. Reported on 05/30/2015    . clonazePAM (KLONOPIN) 0.5 MG tablet Take 1 tablet (0.5 mg total) by mouth 2 (two) times daily. 180 tablet 1  . divalproex (DEPAKOTE ER) 500 MG 24 hr tablet Take 1 tablet (500 mg total) by mouth 2 (two) times daily. 180 tablet 3  . docusate sodium (COLACE) 100 MG capsule Take 100 mg by mouth daily as needed.     Marland Kitchen estradiol (VIVELLE-DOT) 0.075 MG/24HR Place 1 patch onto the skin 2 (two) times a week. On Wednesday and Saturday    . ibuprofen (ADVIL,MOTRIN) 600 MG tablet Take 1 tablet (600 mg total) by mouth 4 (four) times daily. (Patient taking differently: Take 600 mg by mouth as needed. ) 30 tablet 0  . lacosamide (VIMPAT) 200 MG TABS tablet Take 1 tablet (200 mg total) by mouth 2 (two) times daily. 180 tablet 3  . levocetirizine (XYZAL) 5 MG tablet Take 5 mg by mouth every evening.     Marland Kitchen omeprazole (PRILOSEC) 40 MG capsule TAKE 1 CAPSULE DAILY 90 capsule 4  . oxybutynin (DITROPAN-XL) 5 MG 24 hr tablet Take 5 mg by mouth daily.    . Pediatric Multiple Vit-C-FA (PEDIATRIC MULTIVITAMIN) chewable tablet Chew 1 tablet by mouth daily.       No current facility-administered  medications on file prior to visit.         Objective:   Physical Exam Blood pressure 130/74, pulse 65, temperature (!) 97.4 F (36.3 C), height 5\' 5"  (1.651 m), weight 148 lb 6.4 oz (67.3 kg). Alert and oriented. Skin warm and dry. Oral mucosa is moist.   . Sclera anicteric, conjunctivae is pink. Thyroid not enlarged. No cervical lymphadenopathy. Lungs clear. Heart regular rate and rhythm.  Abdomen is soft. Bowel sounds are positive. No hepatomegaly. No abdominal masses felt. No tenderness.  No edema to lower extremities.         Assessment & Plan:  Positive cologuard. Will schedule a colonoscopy to rule out polyp or colon cancer.

## 2018-10-28 ENCOUNTER — Other Ambulatory Visit (INDEPENDENT_AMBULATORY_CARE_PROVIDER_SITE_OTHER): Payer: Self-pay | Admitting: Internal Medicine

## 2018-10-28 ENCOUNTER — Encounter (INDEPENDENT_AMBULATORY_CARE_PROVIDER_SITE_OTHER): Payer: Self-pay | Admitting: *Deleted

## 2018-10-28 ENCOUNTER — Telehealth (INDEPENDENT_AMBULATORY_CARE_PROVIDER_SITE_OTHER): Payer: Self-pay | Admitting: *Deleted

## 2018-10-28 DIAGNOSIS — R195 Other fecal abnormalities: Secondary | ICD-10-CM

## 2018-10-28 HISTORY — DX: Other fecal abnormalities: R19.5

## 2018-10-28 MED ORDER — PEG 3350-KCL-NA BICARB-NACL 420 G PO SOLR: 4000.0000 mL | Freq: Once | ORAL | 0 refills | Status: AC

## 2018-10-28 NOTE — Telephone Encounter (Signed)
Patient needs trilyte 

## 2018-11-02 ENCOUNTER — Other Ambulatory Visit (HOSPITAL_COMMUNITY): Payer: Medicare Other

## 2018-11-11 DIAGNOSIS — M8580 Other specified disorders of bone density and structure, unspecified site: Secondary | ICD-10-CM | POA: Diagnosis not present

## 2018-11-11 NOTE — Patient Instructions (Signed)
   Your procedure is scheduled on: 11/20/2018  Report to Maria [name] at  6:15   AM.  Call this number if you have problems the morning of surgery: [phone]   Remember:              Follow Directions on the letter you received from Your Physician's office regarding the Bowel Prep  :  Take these medicines the morning of surgery with A SIP OF WATER: Depakote, Prilosec, Ditropan and Klonopin   Do not wear jewelry, make-up or nail polish.    Do not bring valuables to the hospital.  Contacts, dentures or bridgework may not be worn into surgery.  .   Patients discharged the day of surgery will not be allowed to drive home.     Colonoscopy, Adult, Care After This sheet gives you information about how to care for yourself after your procedure. Your health care provider may also give you more specific instructions. If you have problems or questions, contact your health care provider. What can I expect after the procedure? After the procedure, it is common to have:  A small amount of blood in your stool for 24 hours after the procedure.  Some gas.  Mild abdominal cramping or bloating.  Follow these instructions at home: General instructions   For the first 24 hours after the procedure: ? Do not drive or use machinery. ? Do not sign important documents. ? Do not drink alcohol. ? Do your regular daily activities at a slower pace than normal. ? Eat soft, easy-to-digest foods. ? Rest often.  Take over-the-counter or prescription medicines only as told by your health care provider.  It is up to you to get the results of your procedure. Ask your health care provider, or the department performing the procedure, when your results will be ready. Relieving cramping and bloating  Try walking around when you have cramps or feel bloated.  Apply heat to your abdomen as told by your health care provider. Use a heat source that your health care provider recommends, such as a moist heat pack  or a heating pad. ? Place a towel between your skin and the heat source. ? Leave the heat on for 20-30 minutes. ? Remove the heat if your skin turns bright red. This is especially important if you are unable to feel pain, heat, or cold. You may have a greater risk of getting burned. Eating and drinking  Drink enough fluid to keep your urine clear or pale yellow.  Resume your normal diet as instructed by your health care provider. Avoid heavy or fried foods that are hard to digest.  Avoid drinking alcohol for as long as instructed by your health care provider. Contact a health care provider if:  You have blood in your stool 2-3 days after the procedure. Get help right away if:  You have more than a small spotting of blood in your stool.  You pass large blood clots in your stool.  Your abdomen is swollen.  You have nausea or vomiting.  You have a fever.  You have increasing abdominal pain that is not relieved with medicine. This information is not intended to replace advice given to you by your health care provider. Make sure you discuss any questions you have with your health care provider. Document Released: 01/02/2004 Document Revised: 02/12/2016 Document Reviewed: 08/01/2015 Elsevier Interactive Patient Education  [employer].

## 2018-11-16 ENCOUNTER — Encounter (HOSPITAL_COMMUNITY): Payer: Self-pay

## 2018-11-17 ENCOUNTER — Encounter (HOSPITAL_COMMUNITY)
Admission: RE | Admit: 2018-11-17 | Discharge: 2018-11-17 | Disposition: A | Discharge status: Home / Self Care | Payer: Medicare Other | Source: Ambulatory Visit | Attending: Internal Medicine | Admitting: Internal Medicine

## 2018-11-17 ENCOUNTER — Other Ambulatory Visit: Payer: Self-pay

## 2018-11-17 ENCOUNTER — Other Ambulatory Visit (HOSPITAL_COMMUNITY)
Admission: RE | Admit: 2018-11-17 | Discharge: 2018-11-17 | Disposition: A | Discharge status: Home / Self Care | Payer: Medicare Other | Source: Ambulatory Visit | Attending: Internal Medicine | Admitting: Internal Medicine

## 2018-11-17 DIAGNOSIS — Z1159 Encounter for screening for other viral diseases: Secondary | ICD-10-CM | POA: Diagnosis not present

## 2018-11-18 LAB — NOVEL CORONAVIRUS, NAA (HOSP ORDER, SEND-OUT TO REF LAB; TAT 18-24 HRS): SARS-CoV-2, NAA: NOT DETECTED

## 2018-11-20 ENCOUNTER — Ambulatory Visit (HOSPITAL_COMMUNITY): Payer: Medicare Other | Admitting: Anesthesiology

## 2018-11-20 ENCOUNTER — Ambulatory Visit (HOSPITAL_COMMUNITY)
Admission: RE | Admit: 2018-11-20 | Discharge: 2018-11-20 | Disposition: A | Discharge status: Home / Self Care | Payer: Medicare Other | Attending: Internal Medicine | Admitting: Internal Medicine

## 2018-11-20 ENCOUNTER — Encounter (HOSPITAL_COMMUNITY)
Admission: RE | Disposition: A | Discharge status: Home / Self Care | Payer: Self-pay | Source: Home / Self Care | Attending: Internal Medicine

## 2018-11-20 ENCOUNTER — Encounter (HOSPITAL_COMMUNITY): Payer: Self-pay | Admitting: Emergency Medicine

## 2018-11-20 ENCOUNTER — Other Ambulatory Visit: Payer: Self-pay

## 2018-11-20 ENCOUNTER — Ambulatory Visit (HOSPITAL_COMMUNITY): Payer: Medicare Other

## 2018-11-20 ENCOUNTER — Emergency Department (HOSPITAL_COMMUNITY)
Admission: EM | Admit: 2018-11-20 | Discharge: 2018-11-20 | Discharge status: Against Medical Advice | Payer: Medicare Other | Source: Home / Self Care | Attending: Emergency Medicine | Admitting: Emergency Medicine

## 2018-11-20 DIAGNOSIS — G40909 Epilepsy, unspecified, not intractable, without status epilepticus: Secondary | ICD-10-CM | POA: Insufficient documentation

## 2018-11-20 DIAGNOSIS — Z87891 Personal history of nicotine dependence: Secondary | ICD-10-CM | POA: Insufficient documentation

## 2018-11-20 DIAGNOSIS — K644 Residual hemorrhoidal skin tags: Secondary | ICD-10-CM | POA: Insufficient documentation

## 2018-11-20 DIAGNOSIS — K635 Polyp of colon: Secondary | ICD-10-CM | POA: Diagnosis not present

## 2018-11-20 DIAGNOSIS — R195 Other fecal abnormalities: Secondary | ICD-10-CM | POA: Diagnosis not present

## 2018-11-20 DIAGNOSIS — R1084 Generalized abdominal pain: Secondary | ICD-10-CM | POA: Insufficient documentation

## 2018-11-20 DIAGNOSIS — Z79890 Hormone replacement therapy (postmenopausal): Secondary | ICD-10-CM | POA: Insufficient documentation

## 2018-11-20 DIAGNOSIS — F79 Unspecified intellectual disabilities: Secondary | ICD-10-CM | POA: Diagnosis not present

## 2018-11-20 DIAGNOSIS — D120 Benign neoplasm of cecum: Secondary | ICD-10-CM | POA: Diagnosis not present

## 2018-11-20 DIAGNOSIS — D123 Benign neoplasm of transverse colon: Secondary | ICD-10-CM | POA: Insufficient documentation

## 2018-11-20 DIAGNOSIS — Z79899 Other long term (current) drug therapy: Secondary | ICD-10-CM | POA: Insufficient documentation

## 2018-11-20 DIAGNOSIS — K219 Gastro-esophageal reflux disease without esophagitis: Secondary | ICD-10-CM | POA: Diagnosis not present

## 2018-11-20 HISTORY — PX: POLYPECTOMY: SHX5525

## 2018-11-20 HISTORY — PX: COLONOSCOPY WITH PROPOFOL: SHX5780

## 2018-11-20 SURGERY — COLONOSCOPY WITH PROPOFOL
Anesthesia: General

## 2018-11-20 MED ORDER — PROPOFOL 10 MG/ML IV BOLUS
INTRAVENOUS | Status: AC
  Filled 2018-11-20: qty 20

## 2018-11-20 MED ORDER — MIDAZOLAM HCL 2 MG/2ML IJ SOLN: 0.5000 mg | Freq: Once | INTRAMUSCULAR | Status: DC | PRN

## 2018-11-20 MED ORDER — CHLORHEXIDINE GLUCONATE CLOTH 2 % EX PADS: 6.0000 | MEDICATED_PAD | Freq: Once | CUTANEOUS | Status: DC

## 2018-11-20 MED ORDER — LACTATED RINGERS IV SOLN
INTRAVENOUS | Status: DC
  Administered 2018-11-20: 07:00:00 via INTRAVENOUS

## 2018-11-20 MED ORDER — PROMETHAZINE HCL 25 MG/ML IJ SOLN: 6.2500 mg | INTRAMUSCULAR | Status: DC | PRN

## 2018-11-20 MED ORDER — KETAMINE HCL 50 MG/5ML IJ SOSY
PREFILLED_SYRINGE | INTRAMUSCULAR | Status: AC
  Filled 2018-11-20: qty 5

## 2018-11-20 MED ORDER — PROPOFOL 10 MG/ML IV BOLUS
INTRAVENOUS | Status: AC
  Filled 2018-11-20: qty 40

## 2018-11-20 MED ORDER — PROPOFOL 10 MG/ML IV BOLUS
INTRAVENOUS | Status: DC | PRN
  Administered 2018-11-20: 10 mg via INTRAVENOUS
  Administered 2018-11-20 (×2): 20 mg via INTRAVENOUS
  Administered 2018-11-20: 10 mg via INTRAVENOUS
  Administered 2018-11-20: 20 mg via INTRAVENOUS

## 2018-11-20 MED ORDER — SODIUM CHLORIDE 0.9% FLUSH
INTRAVENOUS | Status: AC
  Filled 2018-11-20: qty 10

## 2018-11-20 MED ORDER — PROPOFOL 500 MG/50ML IV EMUL
INTRAVENOUS | Status: DC | PRN
  Administered 2018-11-20: 150 ug/kg/min via INTRAVENOUS

## 2018-11-20 NOTE — Anesthesia Postprocedure Evaluation (Signed)
Anesthesia Post Note  Patient: Maria [name]  Procedure(s) Performed: COLONOSCOPY WITH PROPOFOL (N/A ) POLYPECTOMY  Patient location during evaluation: PACU Anesthesia Type: General Level of consciousness: awake and alert and oriented Pain management: pain level controlled Vital Signs Assessment: post-procedure vital signs reviewed and stable Respiratory status: spontaneous breathing Cardiovascular status: blood pressure returned to baseline and stable Postop Assessment: no apparent nausea or vomiting Anesthetic complications: no     Last Vitals:  Vitals:   11/20/18 0830 11/20/18 0901  BP: 117/61 (!) 141/75  Pulse: 69 60  Resp: 17 15  Temp:  36.7 C  SpO2: 98% 97%    Last Pain:  Vitals:   11/20/18 0901  TempSrc: Oral  PainSc: 0-No pain                 Tenaya Hilyer

## 2018-11-20 NOTE — H&P (Signed)
Maria [name] is an 58 y.o. female.   Chief Complaint: Patient is here for colonoscopy. HPI: Patient is a 58 year old Caucasian female with history of mental retardation and seizure disorder as well as GERD and mild thrombocytopenia who underwent Cologuard testing as a screening tool.  The test came back positive.  She denies abdominal pain change in bowel habits anorexia or weight loss.  She states she may have seen small amount of blood with straining but there is no history of frank bleeding. Patient takes ibuprofen no more than a couple of times a month. Family history is negative for CRC.  Past Medical History:  Diagnosis Date  . Constipation   . Dysphagia   . Fracture    R foot  . GERD (gastroesophageal reflux disease)   . History of shingles 10/2016  . Mental retardation    lesion in head  . Seizures ([place])    "not fully controlled on max doses of meds" (Neuro ofc note 12/2014)  . Thrombocytopenia ([place])   . Tremor     Past Surgical History:  Procedure Laterality Date  . BIOPSY  09/16/2014   Procedure: BIOPSY;  Surgeon: Rogene Houston, MD;  Location: [facility];  Service: Endoscopy;;  . CATARACT EXTRACTION     both eyes, May of 2015  . ESOPHAGEAL DILATION N/A 09/16/2014   Procedure: ESOPHAGEAL DILATION WITH 54FR MALONEY DILATOR;  Surgeon: Rogene Houston, MD;  Location: [facility];  Service: Endoscopy;  Laterality: N/A;  . ESOPHAGOGASTRODUODENOSCOPY (EGD) WITH PROPOFOL N/A 09/16/2014   Procedure: ESOPHAGOGASTRODUODENOSCOPY (EGD) WITH PROPOFOL;  Surgeon: Rogene Houston, MD;  Location: [facility];  Service: Endoscopy;  Laterality: N/A;  . MOUTH SURGERY    . Skin graft to gum Right 08/2013  . TONSILLECTOMY AND ADENOIDECTOMY    . TOTAL ABDOMINAL HYSTERECTOMY      Family History  Problem Relation Age of Onset  . High Cholesterol Mother   . High blood pressure Mother   . Diabetes Father    Social History:  reports that she quit smoking about 12 years ago. Her smoking use included  cigarettes. She has a 22.50 pack-year smoking history. She has never used smokeless tobacco. She reports that she does not drink alcohol or use drugs.  Allergies:  Allergies  Allergen Reactions  . Codeine Nausea And Vomiting  . Lamictal [Lamotrigine] Rash    Medications Prior to Admission  Medication Sig Dispense Refill  . atorvastatin (LIPITOR) 20 MG tablet Take 20 mg by mouth daily.    . Calcium Carbonate-Vitamin D3 (CALCIUM 600-D) 600-400 MG-UNIT TABS Take 1 tablet by mouth 2 (two) times a day.    . clonazePAM (KLONOPIN) 0.5 MG tablet Take 1 tablet (0.5 mg total) by mouth 2 (two) times daily. 180 tablet 1  . divalproex (DEPAKOTE ER) 500 MG 24 hr tablet Take 1 tablet (500 mg total) by mouth 2 (two) times daily. 180 tablet 3  . estradiol (VIVELLE-DOT) 0.05 MG/24HR patch Place 1 patch onto the skin 2 (two) times a week. On Wednesday and Saturday    . ibuprofen (ADVIL) 200 MG tablet Take 400 mg by mouth every 6 (six) hours as needed for moderate pain.    Marland Kitchen lacosamide (VIMPAT) 200 MG TABS tablet Take 1 tablet (200 mg total) by mouth 2 (two) times daily. 180 tablet 3  . levocetirizine (XYZAL) 5 MG tablet Take 5 mg by mouth every evening.     Marland Kitchen omeprazole (PRILOSEC) 40 MG capsule TAKE 1 CAPSULE DAILY (Patient  taking differently: Take 40 mg by mouth daily. ) 90 capsule 4  . oxybutynin (DITROPAN-XL) 5 MG 24 hr tablet Take 5 mg by mouth at bedtime.     . Pediatric Multiple Vit-C-FA (PEDIATRIC MULTIVITAMIN) chewable tablet Chew 1 tablet by mouth daily.      Marland Kitchen ibuprofen (ADVIL,MOTRIN) 600 MG tablet Take 1 tablet (600 mg total) by mouth 4 (four) times daily. (Patient not taking: Reported on 11/12/2018) 30 tablet 0    No results found for this or any previous visit (from the past 48 hour(s)). No results found.  ROS  Blood pressure (!) 117/58, pulse 65, temperature 97.6 F (36.4 C), temperature source Oral, resp. rate 18, SpO2 97 %. Physical Exam  Constitutional: She appears well-developed and  well-nourished.  HENT:  Mouth/Throat: Oropharynx is clear and moist.  Eyes: Conjunctivae are normal. No scleral icterus.  Neck: No thyromegaly present.  Cardiovascular: Normal rate, regular rhythm and normal heart sounds.  No murmur heard. Respiratory: Effort normal and breath sounds normal.  GI:  Abdomen is symmetrical.  She has multiple multiple tiny reddish moles.  Abdomen is soft and nontender with organomegaly or masses.  Musculoskeletal:        General: No edema.  Lymphadenopathy:    She has no cervical adenopathy.  Neurological: She is alert.  Skin: Skin is warm and dry.     Assessment/Plan Positive Cologuard test. Diagnostic colonoscopy. I have explained the procedure to the patient and she is agreeable. Procedure also discussed with her sister Ms. Faustino Congress who is at bedside.  She has a power of attorney.  Hildred Laser, MD 11/20/2018, 7:20 AM

## 2018-11-20 NOTE — Discharge Instructions (Signed)
No aspirin or NSAIDs for 1 week. Resume usual medications and diet as before. No driving for 24 hours. Physician will call with biopsy results. 

## 2018-11-20 NOTE — ED Provider Notes (Addendum)
[facility] EMERGENCY DEPARTMENT Provider Note   CSN: [id number] Arrival date & time: 11/20/18  1914     History   Chief Complaint Chief Complaint  Patient presents with  . Abdominal Pain    HPI Maria [name] is a 58 y.o. female.     Patient never seen by me.  She left shortly after triage but I have placed orders on her.  Apparently her medical power of attorney took her home.  Patient has MR.  We expressed concerns about possible perforation following the colonoscopy.  She had a colonoscopy earlier today.  They stated they were taking her home they bring her back if she got worse.     Past Medical History:  Diagnosis Date  . Constipation   . Dysphagia   . Fracture    R foot  . GERD (gastroesophageal reflux disease)   . History of shingles 10/2016  . Mental retardation    lesion in head  . Seizures ([place])    "not fully controlled on max doses of meds" (Neuro ofc note 12/2014)  . Thrombocytopenia ([place])   . Tremor     Patient Active Problem List   Diagnosis Date Noted  . Positive colorectal cancer screening using Cologuard test 10/28/2018  . Posterior tibial tendinitis, right leg 04/23/2016  . Pain in right foot 04/12/2016  . Acute encephalopathy   . Myoclonus   . Frequent falls 11/09/2015  . Fall 11/09/2015  . Chest pain 03/16/2014  . Upper abdominal pain 03/16/2014  . Seizure disorder ([place]) 03/16/2014  . 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 Generalized convulsive epilepsy ([place]) 10/06/2012  . Encounter for therapeutic drug monitoring 10/06/2012  . Mental retardation   . Thrombocytopenia ([place]) 05/23/2011  . Seizures ([place]) 03/21/2011  . GERD (gastroesophageal reflux disease) 03/21/2011  . CLOSED FRACTURE OF ACROMIAL END OF CLAVICLE 07/13/2008    Past Surgical History:  Procedure Laterality Date  . BIOPSY  09/16/2014   Procedure: BIOPSY;  Surgeon: Rogene Houston, MD;  Location: [facility];  Service: Endoscopy;;  . CATARACT EXTRACTION     both  eyes, May of 2015  . COLONOSCOPY    . ESOPHAGEAL DILATION N/A 09/16/2014   Procedure: ESOPHAGEAL DILATION WITH 54FR MALONEY DILATOR;  Surgeon: Rogene Houston, MD;  Location: [facility];  Service: Endoscopy;  Laterality: N/A;  . ESOPHAGOGASTRODUODENOSCOPY (EGD) WITH PROPOFOL N/A 09/16/2014   Procedure: ESOPHAGOGASTRODUODENOSCOPY (EGD) WITH PROPOFOL;  Surgeon: Rogene Houston, MD;  Location: [facility];  Service: Endoscopy;  Laterality: N/A;  . MOUTH SURGERY    . Skin graft to gum Right 08/2013  . TONSILLECTOMY AND ADENOIDECTOMY    . TOTAL ABDOMINAL HYSTERECTOMY       OB History   No obstetric history on file.      Home Medications    Prior to Admission medications   Medication Sig Start Date End Date Taking? Authorizing Provider  atorvastatin (LIPITOR) 20 MG tablet Take 20 mg by mouth daily. 08/24/18   [provider]  Calcium Carbonate-Vitamin D3 (CALCIUM 600-D) 600-400 MG-UNIT TABS Take 1 tablet by mouth 2 (two) times a day.    [provider]  clonazePAM (KLONOPIN) 0.5 MG tablet Take 1 tablet (0.5 mg total) by mouth 2 (two) times daily. 10/12/18   Kathrynn Ducking, MD  divalproex (DEPAKOTE ER) 500 MG 24 hr tablet Take 1 tablet (500 mg total) by mouth 2 (two) times daily. 07/20/18   Kathrynn Ducking, MD  estradiol (VIVELLE-DOT) 0.05  MG/24HR patch Place 1 patch onto the skin 2 (two) times a week. On Wednesday and Saturday    [provider]  ibuprofen (ADVIL) 200 MG tablet Take 400 mg by mouth every 6 (six) hours as needed for moderate pain.    [provider]  lacosamide (VIMPAT) 200 MG TABS tablet Take 1 tablet (200 mg total) by mouth 2 (two) times daily. 05/29/18   Sater, Nanine Means, MD  levocetirizine (XYZAL) 5 MG tablet Take 5 mg by mouth every evening.     [provider]  omeprazole (PRILOSEC) 40 MG capsule TAKE 1 CAPSULE DAILY Patient taking differently: Take 40 mg by mouth daily.  04/09/18   Setzer, Rona Ravens, NP  oxybutynin (DITROPAN-XL) 5 MG 24  hr tablet Take 5 mg by mouth at bedtime.     [provider]  Pediatric Multiple Vit-C-FA (PEDIATRIC MULTIVITAMIN) chewable tablet Chew 1 tablet by mouth daily.      [provider]    Family History Family History  Problem Relation Age of Onset  . High Cholesterol Mother   . High blood pressure Mother   . Diabetes Father     Social History Social History   Tobacco Use  . Smoking status: Former Smoker    Packs/day: 1.50    Years: 15.00    Pack years: 22.50    Types: Cigarettes    Quit date: 05/22/2006    Years since quitting: 12.5  . Smokeless tobacco: Never Used  Substance Use Topics  . Alcohol use: No    Alcohol/week: 0.0 standard drinks  . Drug use: No     Allergies   Codeine and Lamictal [lamotrigine]   Review of Systems Review of Systems   Physical Exam Updated Vital Signs BP 137/73 (BP Location: Left Arm)   Pulse 75   Temp 98.2 F (36.8 C) (Oral)   Resp 18   Ht 1.651 m (5\' 5" )   Wt 63.5 kg   SpO2 100%   BMI 23.30 kg/m   Physical Exam   ED Treatments / Results  Labs (all labs ordered are listed, but only abnormal results are displayed) Labs Reviewed  COMPREHENSIVE METABOLIC PANEL  LIPASE, BLOOD  CBC WITH DIFFERENTIAL/PLATELET    EKG    Radiology No results found.  Procedures Procedures (including critical care time)  Medications Ordered in ED Medications - No data to display   Initial Impression / Assessment and Plan / ED Course  I have reviewed the triage vital signs and the nursing notes.  Pertinent labs & imaging results that were available during my care of the patient were reviewed by me and considered in my medical decision making (see chart for details).        Orders were placed for concerns for perforation following the colonoscopy.  Patient's power of attorney came and took her before she was evaluated by me.  She had just been in the room for short period of time.  Patient has a history of MR.   Nursing explained that they were concerned about a serious complication to the colonoscopy but the power of attorney said that they will take her home and bring her back if she was worse.  Patient just had colonoscopy earlier today in the morning.  Final Clinical Impressions(s) / ED Diagnoses   Final diagnoses:  Generalized abdominal pain    ED Discharge Orders    None       Fredia Sorrow, MD 11/20/18 2107    Rogene Houston,  Nicki Reaper, MD 11/20/18 2108

## 2018-11-20 NOTE — ED Triage Notes (Addendum)
Pt c/o abd pain that started this afternoon. Pt had a colonoscopy this AM. Hasn't ate much or had BM since this AM. HCPOA is with pt.

## 2018-11-20 NOTE — Anesthesia Preprocedure Evaluation (Signed)
Anesthesia Evaluation  Patient identified by MRN, date of birth, ID band Patient awake    Reviewed: Allergy & Precautions, NPO status , Patient's Chart, lab work & pertinent test results  Airway Mallampati: II  TM Distance: >3 FB Neck ROM: Full    Dental no notable dental hx. (+) Teeth Intact   Pulmonary neg pulmonary ROS, former smoker,    Pulmonary exam normal breath sounds clear to auscultation       Cardiovascular Exercise Tolerance: Good negative cardio ROS Normal cardiovascular examI Rhythm:Regular Rate:Normal     Neuro/Psych Seizures -, Well Controlled,  PSYCHIATRIC DISORDERS Last Sz 10/2017    GI/Hepatic Neg liver ROS, GERD  Medicated and Controlled,Denies GERD Sx today   Endo/Other  negative endocrine ROS  Renal/GU negative Renal ROS  negative genitourinary   Musculoskeletal negative musculoskeletal ROS (+)   Abdominal   Peds negative pediatric ROS (+)  Hematology negative hematology ROS (+)   Anesthesia Other Findings   Reproductive/Obstetrics negative OB ROS                             Anesthesia Physical Anesthesia Plan  ASA: II  Anesthesia Plan: General   Post-op Pain Management:    Induction: Intravenous  PONV Risk Score and Plan:   Airway Management Planned: Nasal Cannula and Simple Face Mask  Additional Equipment:   Intra-op Plan:   Post-operative Plan: Extubation in OR  Informed Consent: I have reviewed the patients History and Physical, chart, labs and discussed the procedure including the risks, benefits and alternatives for the proposed anesthesia with the patient or authorized representative who has indicated his/her understanding and acceptance.     Dental advisory given  Plan Discussed with: CRNA  Anesthesia Plan Comments: (Plan Full PPE use  Plan GA vs GETA as needed -d/w pt/daughter-poa-Both WTP as planned )        Anesthesia Quick  Evaluation

## 2018-11-20 NOTE — Op Note (Signed)
[facility] Patient Name: Maria [name] Procedure Date: 11/20/2018 7:12 AM MRN: [id number] Date of Birth: 08/02/1960 Attending MD: Hildred Laser , MD CSN: [id number] Age: 58 Admit Type: Outpatient Procedure:                Colonoscopy Indications:              Positive Cologuard test Providers:                Hildred Laser, MD, Jeanann Lewandowsky. Sharon Seller, RN, Raphael Gibney, Technician Referring MD:             Delphina Cahill, MD Medicines:                Propofol per Anesthesia Complications:            No immediate complications. Estimated Blood Loss:     Estimated blood loss: none. Procedure:                Pre-Anesthesia Assessment:                           - Prior to the procedure, a History and Physical                            was performed, and patient medications and                            allergies were reviewed. The patient's tolerance of                            previous anesthesia was also reviewed. The risks                            and benefits of the procedure and the sedation                            options and risks were discussed with the patient.                            All questions were answered, and informed consent                            was obtained. Prior Anticoagulants: The patient has                            taken no previous anticoagulant or antiplatelet                            agents. ASA Grade Assessment: II - A patient with                            mild systemic disease. After reviewing the risks  and benefits, the patient was deemed in                            satisfactory condition to undergo the procedure.                           After obtaining informed consent, the colonoscope                            was passed under direct vision. Throughout the                            procedure, the patient's blood pressure, pulse, and                            oxygen saturations  were monitored continuously. The                            PCF-[id number] ([id number]) scope was introduced through                            the anus and advanced to the the cecum, identified                            by appendiceal orifice and ileocecal valve. The                            colonoscopy was performed without difficulty. The                            patient tolerated the procedure well. The quality                            of the bowel preparation was good. The ileocecal                            valve, appendiceal orifice, and rectum were                            photographed. Scope In: 7:33:55 AM Scope Out: 8:16:28 AM Scope Withdrawal Time: 0 hours 34 minutes 31 seconds  Total Procedure Duration: 0 hours 42 minutes 33 seconds  Findings:      The perianal and digital rectal examinations were normal.      A 12 mm polyp was found in the ileocecal valve. The polyp was       carpet-like. Area was successfully injected with Eleview for lesion       assessment, and this injection appeared to lift the lesion adequately.       The polyp was removed with a hot snare. Resection and retrieval were       complete. The pathology specimen was placed into Bottle Number 1.      A 8 to 12 mm polyp was found in the mid transverse colon. The polyp was       carpet-like. Area was successfully injected with Eleview for  lesion       assessment, and this injection appeared to lift the lesion adequately.       The polyp was removed with a piecemeal technique using a hot snare.       Resection was complete, but the polyp tissue was only partially       retrieved. Coagulation for destruction of remaining portion of lesion       using monopolar probe was successful.      External hemorrhoids were found during retroflexion. The hemorrhoids       were small. Impression:               - One 12 mm polyp at the ileocecal valve, removed                            with a hot snare. Resected and  retrieved. Injected.                           - One 8 to 12 mm polyp in the mid transverse colon,                            removed piecemeal using a hot snare. Complete                            resection. Partial retrieval. Injected. Treated                            with a monopolar probe.                           - External hemorrhoids. Moderate Sedation:      Per Anesthesia Care Recommendation:           - Patient has a contact number available for                            emergencies. The signs and symptoms of potential                            delayed complications were discussed with the                            patient. Return to normal activities tomorrow.                            Written discharge instructions were provided to the                            patient.                           - Resume previous diet today.                           - Continue present medications.                           -  No aspirin, ibuprofen, naproxen, or other                            non-steroidal anti-inflammatory drugs for 7 days                            after polyp removal.                           - Await pathology results.                           - Repeat colonoscopy for surveillance based on                            pathology results. Procedure Code(s):        --- Professional ---                           [phone], Colonoscopy, flexible; with removal of                            tumor(s), polyp(s), or other lesion(s) by snare                            technique                           45381, Colonoscopy, flexible; with directed                            submucosal injection(s), any substance Diagnosis Code(s):        --- Professional ---                           K63.5, Polyp of colon                           K64.4, Residual hemorrhoidal skin tags                           R19.5, Other fecal abnormalities CPT copyright 2019 American Medical Association.  All rights reserved. The codes documented in this report are preliminary and upon coder review may  be revised to meet current compliance requirements. Hildred Laser, MD Hildred Laser, MD 11/20/2018 8:28:11 AM This report has been signed electronically. Number of Addenda: 0

## 2018-11-20 NOTE — Transfer of Care (Signed)
Immediate Anesthesia Transfer of Care Note  Patient: [name]  Procedure(s) Performed: COLONOSCOPY WITH PROPOFOL (N/A ) POLYPECTOMY  Patient Location: PACU  Anesthesia Type:General  Level of Consciousness: awake  Airway & Oxygen Therapy: Patient Spontanous Breathing  Post-op Assessment: Report given to RN  Post vital signs: Reviewed and stable  Last Vitals:  Vitals Value Taken Time  BP    Temp    Pulse 68 11/20/18 0825  Resp 16 11/20/18 0825  SpO2 100 % 11/20/18 0825  Vitals shown include unvalidated device data.  Last Pain:  Vitals:   11/20/18 0648  TempSrc: Oral      Patients Stated Pain Goal: 5 (32/35/57 3220)  Complications: No apparent anesthesia complications

## 2018-11-25 ENCOUNTER — Encounter (HOSPITAL_COMMUNITY): Payer: Self-pay | Admitting: Internal Medicine

## 2018-12-11 DIAGNOSIS — H40013 Open angle with borderline findings, low risk, bilateral: Secondary | ICD-10-CM | POA: Diagnosis not present

## 2018-12-11 DIAGNOSIS — H21233 Degeneration of iris (pigmentary), bilateral: Secondary | ICD-10-CM | POA: Diagnosis not present

## 2018-12-11 DIAGNOSIS — H26493 Other secondary cataract, bilateral: Secondary | ICD-10-CM | POA: Diagnosis not present

## 2019-04-21 ENCOUNTER — Other Ambulatory Visit: Payer: Self-pay | Admitting: Adult Health

## 2019-04-21 DIAGNOSIS — R569 Unspecified convulsions: Secondary | ICD-10-CM

## 2019-04-21 MED ORDER — CLONAZEPAM 0.5 MG PO TABS: 0.5000 mg | ORAL_TABLET | Freq: Two times a day (BID) | ORAL | 1 refills | Status: DC

## 2019-04-21 NOTE — Telephone Encounter (Signed)
Pt is needing a refill on her clonazePAM (KLONOPIN) 0.5 MG tablet sent in to [facility]

## 2019-04-21 NOTE — Telephone Encounter (Signed)
Drug registry checked clonazepam 0.5mg  #180 last fill 01-20-19.  Last seen 10-20-18 by MM/NP.

## 2019-05-14 DIAGNOSIS — K219 Gastro-esophageal reflux disease without esophagitis: Secondary | ICD-10-CM | POA: Diagnosis not present

## 2019-05-14 DIAGNOSIS — Z6826 Body mass index (BMI) 26.0-26.9, adult: Secondary | ICD-10-CM | POA: Diagnosis not present

## 2019-05-14 DIAGNOSIS — R569 Unspecified convulsions: Secondary | ICD-10-CM | POA: Diagnosis not present

## 2019-05-14 DIAGNOSIS — E782 Mixed hyperlipidemia: Secondary | ICD-10-CM | POA: Diagnosis not present

## 2019-05-14 DIAGNOSIS — Z0001 Encounter for general adult medical examination with abnormal findings: Secondary | ICD-10-CM | POA: Diagnosis not present

## 2019-05-14 DIAGNOSIS — N3281 Overactive bladder: Secondary | ICD-10-CM | POA: Diagnosis not present

## 2019-05-14 DIAGNOSIS — G40909 Epilepsy, unspecified, not intractable, without status epilepticus: Secondary | ICD-10-CM | POA: Diagnosis not present

## 2019-05-14 DIAGNOSIS — M25562 Pain in left knee: Secondary | ICD-10-CM | POA: Diagnosis not present

## 2019-05-14 DIAGNOSIS — J309 Allergic rhinitis, unspecified: Secondary | ICD-10-CM | POA: Diagnosis not present

## 2019-05-14 DIAGNOSIS — D696 Thrombocytopenia, unspecified: Secondary | ICD-10-CM | POA: Diagnosis not present

## 2019-05-14 DIAGNOSIS — Z8669 Personal history of other diseases of the nervous system and sense organs: Secondary | ICD-10-CM | POA: Diagnosis not present

## 2019-05-14 DIAGNOSIS — M8580 Other specified disorders of bone density and structure, unspecified site: Secondary | ICD-10-CM | POA: Diagnosis not present

## 2019-05-17 ENCOUNTER — Telehealth: Payer: Self-pay | Admitting: Adult Health

## 2019-05-17 DIAGNOSIS — R569 Unspecified convulsions: Secondary | ICD-10-CM

## 2019-05-17 MED ORDER — LACOSAMIDE 200 MG PO TABS: 200.0000 mg | ORAL_TABLET | Freq: Two times a day (BID) | ORAL | 1 refills | Status: DC

## 2019-05-17 MED ORDER — DIVALPROEX SODIUM ER 500 MG PO TB24: 500.0000 mg | ORAL_TABLET | Freq: Two times a day (BID) | ORAL | 3 refills | Status: DC

## 2019-05-17 NOTE — Telephone Encounter (Signed)
Pt is needing a refill on her divalproex (DEPAKOTE ER) 500 MG 24 hr tablet and her lacosamide (VIMPAT) 200 MG TABS tablet sent in to [facility]

## 2019-05-26 ENCOUNTER — Other Ambulatory Visit (INDEPENDENT_AMBULATORY_CARE_PROVIDER_SITE_OTHER): Payer: Self-pay | Admitting: *Deleted

## 2019-05-26 DIAGNOSIS — K219 Gastro-esophageal reflux disease without esophagitis: Secondary | ICD-10-CM

## 2019-05-26 MED ORDER — OMEPRAZOLE 40 MG PO CPDR: 40.0000 mg | DELAYED_RELEASE_CAPSULE | Freq: Every day | ORAL | 4 refills | Status: DC

## 2019-05-26 MED ORDER — OMEPRAZOLE 40 MG PO CPDR: 40.0000 mg | DELAYED_RELEASE_CAPSULE | Freq: Every day | ORAL | 3 refills | Status: DC

## 2019-05-31 ENCOUNTER — Other Ambulatory Visit (INDEPENDENT_AMBULATORY_CARE_PROVIDER_SITE_OTHER): Payer: Self-pay | Admitting: Gastroenterology

## 2019-05-31 DIAGNOSIS — K219 Gastro-esophageal reflux disease without esophagitis: Secondary | ICD-10-CM

## 2019-05-31 MED ORDER — OMEPRAZOLE 40 MG PO CPDR: 40.0000 mg | DELAYED_RELEASE_CAPSULE | Freq: Every day | ORAL | 2 refills | Status: DC

## 2019-05-31 NOTE — Progress Notes (Signed)
Refill of omeprazole sent. UTD on yearly OV.

## 2019-08-04 DIAGNOSIS — Z8669 Personal history of other diseases of the nervous system and sense organs: Secondary | ICD-10-CM | POA: Insufficient documentation

## 2019-08-04 DIAGNOSIS — Z124 Encounter for screening for malignant neoplasm of cervix: Secondary | ICD-10-CM | POA: Diagnosis not present

## 2019-08-04 DIAGNOSIS — Z6825 Body mass index (BMI) 25.0-25.9, adult: Secondary | ICD-10-CM | POA: Diagnosis not present

## 2019-08-26 DIAGNOSIS — Z23 Encounter for immunization: Secondary | ICD-10-CM | POA: Diagnosis not present

## 2019-09-18 DIAGNOSIS — Z23 Encounter for immunization: Secondary | ICD-10-CM | POA: Diagnosis not present

## 2019-09-28 ENCOUNTER — Ambulatory Visit (INDEPENDENT_AMBULATORY_CARE_PROVIDER_SITE_OTHER): Payer: Medicare Other | Admitting: Nurse Practitioner

## 2019-10-20 ENCOUNTER — Other Ambulatory Visit: Payer: Self-pay | Admitting: Adult Health

## 2019-10-20 ENCOUNTER — Ambulatory Visit: Payer: Medicare Other | Admitting: Adult Health

## 2019-10-20 DIAGNOSIS — R569 Unspecified convulsions: Secondary | ICD-10-CM

## 2019-10-20 MED ORDER — CLONAZEPAM 0.5 MG PO TABS: 0.5000 mg | ORAL_TABLET | Freq: Two times a day (BID) | ORAL | 1 refills | Status: DC

## 2019-10-20 NOTE — Telephone Encounter (Signed)
1) Medication(s) Requested (by name): clonazePAM (KLONOPIN) 0.5 MG tablet   2) Pharm[facility] Choice:  [facility]

## 2019-10-21 ENCOUNTER — Ambulatory Visit (INDEPENDENT_AMBULATORY_CARE_PROVIDER_SITE_OTHER): Payer: Medicare Other | Admitting: Gastroenterology

## 2019-11-03 ENCOUNTER — Encounter (INDEPENDENT_AMBULATORY_CARE_PROVIDER_SITE_OTHER): Payer: Self-pay | Admitting: Gastroenterology

## 2019-11-03 ENCOUNTER — Other Ambulatory Visit: Payer: Self-pay

## 2019-11-03 ENCOUNTER — Ambulatory Visit (INDEPENDENT_AMBULATORY_CARE_PROVIDER_SITE_OTHER): Payer: Medicare Other | Admitting: Gastroenterology

## 2019-11-03 VITALS — BP 116/73 | HR 65 | Temp 97.0°F | Ht 65.0 in | Wt 148.7 lb

## 2019-11-03 DIAGNOSIS — Z86010 Personal history of colonic polyps: Secondary | ICD-10-CM | POA: Diagnosis not present

## 2019-11-03 DIAGNOSIS — K219 Gastro-esophageal reflux disease without esophagitis: Secondary | ICD-10-CM

## 2019-11-03 MED ORDER — OMEPRAZOLE 40 MG PO CPDR: 40.0000 mg | DELAYED_RELEASE_CAPSULE | Freq: Every day | ORAL | 3 refills | Status: DC

## 2019-11-03 NOTE — Progress Notes (Signed)
Patient profile: Maria [name] is a 59 y.o. female seen for yearly f.up    History of Present Illness: Maria [name] is seen today for yearly follow-up of GERD.  She reports typically her reflux symptoms are well controlled on omeprazole 40 mg once a day.  She occasionally uses Tums as needed but reports this is not frequent.  She denies any nausea vomiting.  Remote history of dysphagia many years ago but this has not reoccurred.  She feels her appetite is good but she generally only eats two meals a day due to sleeping a lot of the day.  She usually has a bowel movement daily, she denies any constipation, diarrhea, melena, rectal bleeding.  She has no lower abdominal complaints.  She is accompanied today by her mom who helps with history.  She stopped smoking many years ago.  She has very rare NSAID use.  2 cups of coffee a day.  Tries to avoid spicy foods  Wt Readings from Last 3 Encounters:  11/03/19 148 lb 11.2 oz (67.4 kg)  11/20/18 140 lb (63.5 kg)  10/21/18 148 lb 6.4 oz (67.3 kg)     Last Colonoscopy: 11/2018-for positive Cologuard test-12 mm polyp IC valve, 8 to 12 mm polyp mid transverse partially retrieved, external hemorrhoids  2016 Endoscopy  Ectopic islands of gastric tissue below upper esophageal sphincter. Noncritical Schatzki's ring and small sliding hiatal hernia. Small hyperplastic appearing polyps at gastric body. These were left alone. Erosive gastritis with small gastric ulcer at antrum. Esophagus dilated by passing 84 French Maloney dilator resulting in linear disruption at GE junction and also it proximal esophagus indicative of disrupted web.   Past Medical History:  Past Medical History:  Diagnosis Date  . Constipation   . Dysphagia   . Fracture    R foot  . GERD (gastroesophageal reflux disease)   . History of shingles 10/2016  . Mental retardation    lesion in head  . Seizures ([place])    "not fully controlled on max doses of meds" (Neuro ofc  note 12/2014)  . Thrombocytopenia ([place])   . Tremor     Problem List: Patient Active Problem List   Diagnosis Date Noted  . Positive colorectal cancer screening using Cologuard test 10/28/2018  . Posterior tibial tendinitis, right leg 04/23/2016  . Pain in right foot 04/12/2016  . Acute encephalopathy   . Myoclonus   . Frequent falls 11/09/2015  . Fall 11/09/2015  . Chest pain 03/16/2014  . Upper abdominal pain 03/16/2014  . Seizure disorder ([place]) 03/16/2014  . 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 Generalized convulsive epilepsy ([place]) 10/06/2012  . Encounter for therapeutic drug monitoring 10/06/2012  . Mental retardation   . Thrombocytopenia ([place]) 05/23/2011  . Seizures ([place]) 03/21/2011  . GERD (gastroesophageal reflux disease) 03/21/2011  . CLOSED FRACTURE OF ACROMIAL END OF CLAVICLE 07/13/2008    Past Surgical History: Past Surgical History:  Procedure Laterality Date  . BIOPSY  09/16/2014   Procedure: BIOPSY;  Surgeon: Rogene Houston, MD;  Location: [facility];  Service: Endoscopy;;  . CATARACT EXTRACTION     both eyes, May of 2015  . COLONOSCOPY    . COLONOSCOPY WITH PROPOFOL N/A 11/20/2018   Procedure: COLONOSCOPY WITH PROPOFOL;  Surgeon: Rogene Houston, MD;  Location: [facility];  Service: Endoscopy;  Laterality: N/A;  . ESOPHAGEAL DILATION N/A 09/16/2014   Procedure: ESOPHAGEAL DILATION WITH 54FR MALONEY DILATOR;  Surgeon: Rogene Houston, MD;  Location: [facility];  Service: Endoscopy;  Laterality: N/A;  . ESOPHAGOGASTRODUODENOSCOPY (EGD) WITH PROPOFOL N/A 09/16/2014   Procedure: ESOPHAGOGASTRODUODENOSCOPY (EGD) WITH PROPOFOL;  Surgeon: Rogene Houston, MD;  Location: [facility];  Service: Endoscopy;  Laterality: N/A;  . MOUTH SURGERY    . POLYPECTOMY  11/20/2018   Procedure: POLYPECTOMY;  Surgeon: Rogene Houston, MD;  Location: [facility];  Service: Endoscopy;;  colon  . Skin graft to gum Right 08/2013  . TONSILLECTOMY AND ADENOIDECTOMY    . TOTAL ABDOMINAL  HYSTERECTOMY      Allergies: Allergies  Allergen Reactions  . Codeine Nausea And Vomiting  . Lamictal [Lamotrigine] Rash      Home Medications:  Current Outpatient Medications:  .  atorvastatin (LIPITOR) 20 MG tablet, Take 20 mg by mouth daily., Disp: , Rfl:  .  Calcium Carbonate-Vitamin D3 (CALCIUM 600-D) 600-400 MG-UNIT TABS, Take 1 tablet by mouth 2 (two) times a day., Disp: , Rfl:  .  clonazePAM (KLONOPIN) 0.5 MG tablet, Take 1 tablet (0.5 mg total) by mouth 2 (two) times daily., Disp: 180 tablet, Rfl: 1 .  divalproex (DEPAKOTE ER) 500 MG 24 hr tablet, Take 1 tablet (500 mg total) by mouth 2 (two) times daily., Disp: 180 tablet, Rfl: 3 .  estradiol (VIVELLE-DOT) 0.05 MG/24HR patch, Place 1 patch onto the skin 2 (two) times a week. On Wednesday and Saturday, Disp: , Rfl:  .  ibuprofen (ADVIL) 200 MG tablet, Take 400 mg by mouth every 6 (six) hours as needed for moderate pain., Disp: , Rfl:  .  lacosamide (VIMPAT) 200 MG TABS tablet, Take 1 tablet (200 mg total) by mouth 2 (two) times daily., Disp: 180 tablet, Rfl: 1 .  levocetirizine (XYZAL) 5 MG tablet, Take 5 mg by mouth every evening. , Disp: , Rfl:  .  omeprazole (PRILOSEC) 40 MG capsule, Take 1 capsule (40 mg total) by mouth daily., Disp: 90 capsule, Rfl: 3 .  oxybutynin (DITROPAN-XL) 5 MG 24 hr tablet, Take 5 mg by mouth at bedtime. , Disp: , Rfl:  .  Pediatric Multiple Vit-C-FA (PEDIATRIC MULTIVITAMIN) chewable tablet, Chew 1 tablet by mouth daily.  , Disp: , Rfl:    Family History: family history includes Diabetes in her father; High Cholesterol in her mother; High blood pressure in her mother.    Social History:   reports that she quit smoking about 13 years ago. Her smoking use included cigarettes. She has a 22.50 pack-year smoking history. She has never used smokeless tobacco. She reports that she does not drink alcohol or use drugs.   Review of Systems: Constitutional: Denies weight loss/weight gain  Eyes: No changes  in vision. ENT: No oral lesions, sore throat.  GI: see HPI.  Heme/Lymph: No easy bruising.  CV: No chest pain.  GU: No hematuria.  Integumentary: No rashes.  Neuro: No headaches.  Psych: No depression/anxiety.  Endocrine: No heat/cold intolerance.  Allergic/Immunologic: No urticaria.  Resp: No cough, SOB.  Musculoskeletal: No joint swelling.    Physical Examination: BP 116/73 (BP Location: Right Arm, Patient Position: Sitting)   Pulse 65   Temp (!) 97 F (36.1 C) (Temporal)   Ht 5\' 5"  (1.651 m)   Wt 148 lb 11.2 oz (67.4 kg)   BMI 24.74 kg/m  Gen: NAD, alert and oriented x 4 HEENT: PEERLA, EOMI, Neck: supple, no JVD Chest: CTA bilaterally, no wheezes, crackles, or other adventitious sounds CV: RRR, no m/g/c/r Abd: soft, NT, ND, +BS in all four quadrants;  no HSM, guarding, ridigity, or rebound tenderness Ext: no edema, well perfused with 2+ pulses, Skin: no rash or lesions noted on observed skin Lymph: no noted LAD  Data Reviewed: No labs available in epic for review.    Assessment/Plan: Maria [name] is a 59 y.o. female   1.  GERD-well-controlled on current regimen.  Diet modifications reviewed today.  PPI refilled.  Upper GI alarm symptoms to contact us with sooner than one year reviewed   2.  History of colon polyps-due for repeat colonoscopy 2023  Maria [name] was seen today for follow-up.  Diagnoses and all orders for this visit:  Chronic GERD  Personal history of colonic polyps  Other orders -     omeprazole (PRILOSEC) 40 MG capsule; Take 1 capsule (40 mg total) by mouth daily.     Recommendations: Follow-up 1 year-sooner if needed   I personally performed the service, non-incident to. (WP)  Laurine Blazer, [facility]

## 2019-11-03 NOTE — Patient Instructions (Addendum)
Continue omeprazole at current dose - [facility]. Due for colonoscopy 2023   GERD instructions: -Please avoid lying flat within 2 to 3 hours of eating, this will make reflux symptoms worse. -Some patients find elevating the head of the bed beneficial. -Avoid spicy greasy foods as well as caffeine, coffee, sodas-these food/drinks can worsen heartburn and reflux. -Tobacco will worsen reflux, please try to decrease/eliminate tobacco intake if applicable. -Avoid NSAID products (ibuprofen, aspirin, Advil, Aleve, Goody's, BCs, Alka-Seltzer) - if needing these occasionally please try to take with meal or snack to decrease stomach irritation. -If taking medication for reflux such as prilosec, nexium, aciphex, dexilant, prevacid - take 20-30 minutes before a meal for maximum effectiveness.

## 2019-11-25 ENCOUNTER — Other Ambulatory Visit: Payer: Self-pay

## 2019-11-25 ENCOUNTER — Telehealth: Payer: Self-pay | Admitting: Neurology

## 2019-11-25 ENCOUNTER — Encounter: Payer: Self-pay | Admitting: Neurology

## 2019-11-25 ENCOUNTER — Ambulatory Visit (INDEPENDENT_AMBULATORY_CARE_PROVIDER_SITE_OTHER): Payer: Medicare Other | Admitting: Neurology

## 2019-11-25 VITALS — BP 119/70 | HR 70 | Wt 148.0 lb

## 2019-11-25 DIAGNOSIS — R569 Unspecified convulsions: Secondary | ICD-10-CM | POA: Diagnosis not present

## 2019-11-25 DIAGNOSIS — G40909 Epilepsy, unspecified, not intractable, without status epilepticus: Secondary | ICD-10-CM | POA: Diagnosis not present

## 2019-11-25 MED ORDER — LACOSAMIDE 200 MG PO TABS: 200.0000 mg | ORAL_TABLET | Freq: Two times a day (BID) | ORAL | 1 refills | Status: DC

## 2019-11-25 MED ORDER — DIVALPROEX SODIUM ER 500 MG PO TB24: 500.0000 mg | ORAL_TABLET | Freq: Two times a day (BID) | ORAL | 3 refills | Status: DC

## 2019-11-25 NOTE — Progress Notes (Signed)
I have read the note, and I agree with the clinical assessment and plan.  Milessa Hogan K Taysha Majewski   

## 2019-11-25 NOTE — Patient Instructions (Signed)
Check Depakote blood level today  Continue current medications  See you back in 1 year

## 2019-11-25 NOTE — Progress Notes (Signed)
PATIENT: Maria [name] DOB: [birth year]  REASON FOR VISIT: follow up HISTORY FROM: patient  HISTORY OF PRESENT ILLNESS: Today 11/25/19  Maria [name] is a 59 year old female with history of seizures.  No recent seizures, last was about 4 years ago.  She remains on clonazepam, Depakote, and Vimpat.  She lives with her mother.  She does not drive a car.  Her mother manages her medications.  She had 1 fall recently, tripped on her flip-flop, sprained her left ankle, is wearing a boot. May have occasional tremors of her hands, noted when playing cards.  Denies any new problems or concerns. Had blood work done with PCP last week. Presents today for evaluation accompanied by her mom.  HISTORY 10/20/2018 MM: Maria [name] is a 59 year old female with a history of seizures.  She was unable to do and did not feel comfortable coming to the office due to COVID-19.  We discussed her seizures over the phone.  I also spoke with her mother today with information as well.  The patient has not had any recent seizures.  She is doing well on clonazepam, Depakote and Vimpat.  She lives with her mother.  She is able to complete ADLs independently.  She does not operate a motor vehicle.  Denies any changes with her gait or balance.  Denies any new issues.  REVIEW OF SYSTEMS: Out of a complete 14 system review of symptoms, the patient complains only of the following symptoms, and all other reviewed systems are negative.  Seizure  ALLERGIES: Allergies  Allergen Reactions  . Codeine Nausea And Vomiting  . Lamictal [Lamotrigine] Rash    HOME MEDICATIONS: Outpatient Medications Prior to Visit  Medication Sig Dispense Refill  . atorvastatin (LIPITOR) 20 MG tablet Take 20 mg by mouth daily.    . Calcium Carbonate-Vitamin D3 (CALCIUM 600-D) 600-400 MG-UNIT TABS Take 1 tablet by mouth 2 (two) times a day.    . clonazePAM (KLONOPIN) 0.5 MG tablet Take 1 tablet (0.5 mg total) by mouth 2 (two) times daily. 180 tablet 1    . estradiol (VIVELLE-DOT) 0.05 MG/24HR patch Place 1 patch onto the skin 2 (two) times a week. On Wednesday and Saturday    . ibuprofen (ADVIL) 200 MG tablet Take 400 mg by mouth every 6 (six) hours as needed for moderate pain.    Marland Kitchen levocetirizine (XYZAL) 5 MG tablet Take 5 mg by mouth every evening.     Marland Kitchen omeprazole (PRILOSEC) 40 MG capsule Take 1 capsule (40 mg total) by mouth daily. 90 capsule 3  . oxybutynin (DITROPAN-XL) 5 MG 24 hr tablet Take 5 mg by mouth at bedtime.     . Pediatric Multiple Vit-C-FA (PEDIATRIC MULTIVITAMIN) chewable tablet Chew 1 tablet by mouth daily.      . divalproex (DEPAKOTE ER) 500 MG 24 hr tablet Take 1 tablet (500 mg total) by mouth 2 (two) times daily. 180 tablet 3  . lacosamide (VIMPAT) 200 MG TABS tablet Take 1 tablet (200 mg total) by mouth 2 (two) times daily. 180 tablet 1   No facility-administered medications prior to visit.    PAST MEDICAL HISTORY: Past Medical History:  Diagnosis Date  . Constipation   . Dysphagia   . Fracture    R foot  . GERD (gastroesophageal reflux disease)   . History of shingles 10/2016  . Mental retardation    lesion in head  . Seizures ([place])    "not fully controlled on max doses of meds" (Neuro ofc  note 12/2014)  . Thrombocytopenia ([place])   . Tremor     PAST SURGICAL HISTORY: Past Surgical History:  Procedure Laterality Date  . BIOPSY  09/16/2014   Procedure: BIOPSY;  Surgeon: Rogene Houston, MD;  Location: [facility];  Service: Endoscopy;;  . CATARACT EXTRACTION     both eyes, May of 2015  . COLONOSCOPY    . COLONOSCOPY WITH PROPOFOL N/A 11/20/2018   Procedure: COLONOSCOPY WITH PROPOFOL;  Surgeon: Rogene Houston, MD;  [facility];  Service: Endoscopy;  Laterality: N/A;  . ESOPHAGEAL DILATION N/A 09/16/2014   Procedure: ESOPHAGEAL DILATION WITH 54FR MALONEY DILATOR;  Surgeon: Rogene Houston, MD;  Location: [facility];  Service: Endoscopy;  Laterality: N/A;  . ESOPHAGOGASTRODUODENOSCOPY (EGD) WITH PROPOFOL  N/A 09/16/2014   Procedure: ESOPHAGOGASTRODUODENOSCOPY (EGD) WITH PROPOFOL;  Surgeon: Rogene Houston, MD;  Location: [facility];  Service: Endoscopy;  Laterality: N/A;  . MOUTH SURGERY    . POLYPECTOMY  11/20/2018   Procedure: POLYPECTOMY;  Surgeon: Rogene Houston, MD;  [facility];  Service: Endoscopy;;  colon  . Skin graft to gum Right 08/2013  . TONSILLECTOMY AND ADENOIDECTOMY    . TOTAL ABDOMINAL HYSTERECTOMY      FAMILY HISTORY: Family History  Problem Relation Age of Onset  . High Cholesterol Mother   . High blood pressure Mother   . Diabetes Father     SOCIAL HISTORY: Social History   Socioeconomic History  . Marital status: Single    Spouse name: Not on file  . Number of children: 0  . Years of education: 10  . Highest education level: Not on file  Occupational History    Employer: UNEMPLOYED  Tobacco Use  . Smoking status: Former Smoker    Packs/day: 1.50    Years: 15.00    Pack years: 22.50    Types: Cigarettes    Quit date: 05/22/2006    Years since quitting: 13.5  . Smokeless tobacco: Never Used  Substance and Sexual Activity  . Alcohol use: No    Alcohol/week: 0.0 standard drinks  . Drug use: No  . Sexual activity: Never    Birth control/protection: Other-see comments    Comment: Hysterectomy  Other Topics Concern  . Not on file  Social History Narrative   Patient is single and lives with her mother Romie Minus)      Patient drinks 3 cups of caffeine daily.   Patient is right handed.         Social Determinants of Health   Financial Resource Strain:   . Difficulty of Paying Living Expenses:   Food Insecurity:   . Worried About Charity fundraiser in the Last Year:   . Arboriculturist in the Last Year:   Transportation Needs:   . Film/video editor (Medical):   Marland Kitchen Lack of Transportation (Non-Medical):   Physical Activity:   . Days of Exercise per Week:   . Minutes of Exercise per Session:   Stress:   . Feeling of Stress :   Social  Connections:   . Frequency of Communication with Friends and Family:   . Frequency of Social Gatherings with Friends and Family:   . Attends Religious Services:   . Active Member of Clubs or Organizations:   . Attends Archivist Meetings:   Marland Kitchen Marital Status:   Intimate Partner Violence:   . Fear of Current or Ex-Partner:   . Emotionally Abused:   Marland Kitchen Physically  Abused:   . Sexually Abused:    PHYSICAL EXAM  Vitals:   11/25/19 1256  BP: 119/70  Pulse: 70  Weight: 148 lb (67.1 kg)   Body mass index is 24.63 kg/m.  Generalized: Well developed, in no acute distress   Neurological examination  Mentation: Alert oriented to time, place, most history is provided by her mother. Follows all commands speech and language fluent Cranial nerve II-XII: Pupils were equal round reactive to light. Extraocular movements were full, visual field were full on confrontational test. Facial sensation and strength were normal.  Head turning and shoulder shrug  were normal and symmetric. Motor: Good strength throughout, is wearing orthopedic boot to left lower extremity due to injury. Sensory: Sensory testing is intact to soft touch on all 4 extremities. No evidence of extinction is noted.  Coordination: Cerebellar testing reveals good finger-nose-finger and heel-to-shin bilaterally.  Gait and station: Gait is impaired, limping, due to left foot injury Reflexes: Deep tendon reflexes are symmetric and normal bilaterally.   DIAGNOSTIC DATA (LABS, IMAGING, TESTING) - I reviewed patient records, labs, notes, testing and imaging myself where available.  Lab Results  Component Value Date   WBC 7.3 11/06/2017   HGB 13.0 11/06/2017   HCT 40.0 11/06/2017   MCV 95.2 11/06/2017   PLT 119 (L) 11/06/2017      Component Value Date/Time   NA 136 06/11/2017 1542   NA 143 11/05/2016 1104   K 4.1 06/11/2017 1542   CL 103 06/11/2017 1542   CO2 21 (L) 06/11/2017 1542   GLUCOSE 111 (H) 06/11/2017 1542     BUN 18 06/11/2017 1542   BUN 25 (H) 11/05/2016 1104   CREATININE 0.97 06/11/2017 1542   CALCIUM 9.6 06/11/2017 1542   PROT 7.5 06/11/2017 1815   PROT 6.8 11/05/2016 1104   ALBUMIN 4.1 06/11/2017 1815   ALBUMIN 4.2 11/05/2016 1104   AST 20 06/11/2017 1815   ALT 17 06/11/2017 1815   ALKPHOS 55 06/11/2017 1815   BILITOT 0.6 06/11/2017 1815   BILITOT 0.3 11/05/2016 1104   GFRNONAA >60 06/11/2017 1542   GFRAA >60 06/11/2017 1542   Lab Results  Component Value Date   CHOL  10/13/2009    169        ATP III CLASSIFICATION:  <200     mg/dL   Desirable  200-239  mg/dL   Borderline High  >=240    mg/dL   High          HDL 47 10/13/2009   LDLCALC (H) 10/13/2009    108        Total Cholesterol/HDL:CHD Risk Coronary Heart Disease Risk Table                     Men   Women  1/2 Average Risk   3.4   3.3  Average Risk       5.0   4.4  2 X Average Risk   9.6   7.1  3 X Average Risk  23.4   11.0        Use the calculated Patient Ratio above and the CHD Risk Table to determine the patient's CHD Risk.        ATP III CLASSIFICATION (LDL):  <100     mg/dL   Optimal  100-129  mg/dL   Near or Above                    Optimal  130-159  mg/dL   Borderline  160-189  mg/dL   High  >190     mg/dL   Very High   TRIG 69 10/13/2009   CHOLHDL 3.6 10/13/2009   No results found for: HGBA1C Lab Results  Component Value Date   VITAMINB12 765 05/23/2011   Lab Results  Component Value Date   TSH 2.660 03/16/2014   ASSESSMENT AND PLAN 59 y.o. year old female  has a past medical history of Constipation, Dysphagia, Fracture, GERD (gastroesophageal reflux disease), History of shingles (10/2016), Mental retardation, Seizures ([place]), Thrombocytopenia ([place]), and Tremor. here with:  1.  Seizures  She will remain on clonazepam, Depakote, and Vimpat.  I will send a refill of Vimpat; clonazepam was last filled on 10/22/2019 for 3 months.  I will request recent blood work done last week from her PCP.  I  will check Depakote level today.  She will call for recurrent seizure, otherwise follow-up 1 year or sooner if needed.  I spent 20 minutes of face-to-face and non-face-to-face time with patient.  This included previsit chart review, lab review, study review, order entry, electronic health record documentation, patient education.  Butler Denmark, AGNP-C, DNP 11/25/2019, 1:28 PM [facility] [address] [place], [place] [address] [phone]

## 2019-11-25 NOTE — Telephone Encounter (Signed)
Please try to get most recent blood work from Dr. Nevada Crane, her PCP in [place]. She says it was done last week.

## 2019-11-26 LAB — VALPROIC ACID LEVEL: Valproic Acid Lvl: 110 ug/mL — ABNORMAL HIGH (ref 50–100)

## 2019-11-29 ENCOUNTER — Telehealth: Payer: Self-pay

## 2019-11-29 NOTE — Telephone Encounter (Signed)
Attempted to call the patient without success.  LM on the VM for the patient to call back re: recent results.  **If the patient calls back please relay the message below from Goodman slack NP

## 2019-11-29 NOTE — Progress Notes (Signed)
LM on the VM for the patient to call back re: results

## 2019-11-29 NOTE — Telephone Encounter (Signed)
-----   Message from Suzzanne Cloud, NP sent at 11/29/2019  5:59 AM EDT ----- Depakote level is overall stable. We will continue at current dosing.

## 2019-11-30 ENCOUNTER — Telehealth: Payer: Self-pay | Admitting: Neurology

## 2019-11-30 ENCOUNTER — Telehealth: Payer: Self-pay

## 2019-11-30 NOTE — Telephone Encounter (Signed)
I received blood work collected on 11/19/2019 from primary doctor, WBC 6.1, Hgb 12, platelets 139, creatinine 0.95, sodium 147, alkaline phosphatase 79 (normal), AST 22, ALT 26.

## 2019-11-30 NOTE — Telephone Encounter (Signed)
Fax received today

## 2019-11-30 NOTE — Telephone Encounter (Signed)
Pt was notified of the message below °

## 2019-11-30 NOTE — Telephone Encounter (Signed)
-----   Message from Suzzanne Cloud, NP sent at 11/29/2019  5:59 AM EDT ----- Depakote level is overall stable. We will continue at current dosing.

## 2019-12-02 ENCOUNTER — Other Ambulatory Visit: Payer: Self-pay

## 2019-12-02 ENCOUNTER — Ambulatory Visit (INDEPENDENT_AMBULATORY_CARE_PROVIDER_SITE_OTHER): Payer: Medicare Other | Admitting: Orthopaedic Surgery

## 2019-12-02 ENCOUNTER — Encounter: Payer: Self-pay | Admitting: Orthopaedic Surgery

## 2019-12-02 DIAGNOSIS — M25572 Pain in left ankle and joints of left foot: Secondary | ICD-10-CM | POA: Diagnosis not present

## 2019-12-02 NOTE — Progress Notes (Signed)
Office Visit Note   Patient: Maria [name]           Date of Birth: 1961/05/22           MRN: [id number] Visit Date: 12/02/2019              Requested by: Celene Squibb, MD [address] [place],  [place] [address] PCP: Celene Squibb, MD   Assessment & Plan: Visit Diagnoses:  1. Pain in left ankle and joints of left foot     Plan: Fall with left sprained ankle now 2 and half weeks out.  She went away in the tennis shoe should be careful on uneven surfaces and if she goes out of a slick I recommend she use a cam boot for the next 2 weeks if those conditions are present.  Follow-up as needed. Follow-Up Instructions: Return if symptoms worsen or fail to improve.   Orders:  No orders of the defined types were placed in this encounter.  No orders of the defined types were placed in this encounter.     Procedures: No procedures performed   Clinical Data: No additional findings.   Subjective: Chief Complaint  Patient presents with  . Left Ankle - Pain    Fall 6/220/2021    HPI 59 year old female disabled with encephalopathy here with her mother after ankle sprain that occurred when she fell in the kitchen on 11/21/2019.  [facility] placed in a cam boot x-rays were negative which I reviewed today.  I have seen in the past for right ankle problems with posterior tibial tendinitis which should resolve.  Patient's had 2 falls in the last year.  She has been in the cam boot now for 2 and half weeks she is used ibuprofen or Tylenol.  She is wanting no she can use her shoe now and brought it with her.  Review of Systems 14 systems updated of note of history of epilepsy acute encephalopathy.  Patient lives with her mother.  Positive for MR.   Objective: Vital Signs: Ht 5\' 6"  (1.676 m)   Wt 148 lb (67.1 kg)   BMI 23.89 kg/m   Physical Exam Constitutional:      Appearance: She is well-developed.  HENT:     Head: Normocephalic.     Right Ear: External  ear normal.     Left Ear: External ear normal.  Eyes:     Pupils: Pupils are equal, round, and reactive to light.  Neck:     Thyroid: No thyromegaly.     Trachea: No tracheal deviation.  Cardiovascular:     Rate and Rhythm: Normal rate.  Pulmonary:     Effort: Pulmonary effort is normal.  Abdominal:     Palpations: Abdomen is soft.  Skin:    General: Skin is warm and dry.  Neurological:     Mental Status: She is alert and oriented to person, place, and time.  Psychiatric:        Behavior: Behavior normal.     Ortho Exam patient has swelling tenderness of the anterior talofibular.  Some tenderness of the syndesmosis no ecchymosis.  Negative anterior drawer.  She has active ankle flexion dorsiflexion she can stand on her ankle without pain.  Specialty Comments:  No specialty comments available.  Imaging: No results found.   PMFS History: Patient Active Problem List   Diagnosis Date Noted  . Pain in left ankle and joints of left  foot 12/02/2019  . Positive colorectal cancer screening using Cologuard test 10/28/2018  . Posterior tibial tendinitis, right leg 04/23/2016  . Pain in right foot 04/12/2016  . Acute encephalopathy   . Myoclonus   . Frequent falls 11/09/2015  . Fall 11/09/2015  . Chest pain 03/16/2014  . Upper abdominal pain 03/16/2014  . Seizure disorder ([place]) 03/16/2014  . 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 Generalized convulsive epilepsy ([place]) 10/06/2012  . Encounter for therapeutic drug monitoring 10/06/2012  . Mental retardation   . Thrombocytopenia ([place]) 05/23/2011  . Seizures ([place]) 03/21/2011  . GERD (gastroesophageal reflux disease) 03/21/2011  . CLOSED FRACTURE OF ACROMIAL END OF CLAVICLE 07/13/2008   Past Medical History:  Diagnosis Date  . Constipation   . Dysphagia   . Fracture    R foot  . GERD (gastroesophageal reflux disease)   . History of shingles 10/2016  . Mental retardation    lesion in head  . Seizures ([place])    "not  fully controlled on max doses of meds" (Neuro ofc note 12/2014)  . Thrombocytopenia ([place])   . Tremor     Family History  Problem Relation Age of Onset  . High Cholesterol Mother   . High blood pressure Mother   . Diabetes Father     Past Surgical History:  Procedure Laterality Date  . BIOPSY  09/16/2014   Procedure: BIOPSY;  Surgeon: Rogene Houston, MD;  [facility];  Service: Endoscopy;;  . CATARACT EXTRACTION     both eyes, May of 2015  . COLONOSCOPY    . COLONOSCOPY WITH PROPOFOL N/A 11/20/2018   Procedure: COLONOSCOPY WITH PROPOFOL;  Surgeon: Rogene Houston, MD;  [facility];  Service: Endoscopy;  Laterality: N/A;  . ESOPHAGEAL DILATION N/A 09/16/2014   Procedure: ESOPHAGEAL DILATION WITH 54FR MALONEY DILATOR;  Surgeon: Rogene Houston, MD;  Location[facility];  Service: Endoscopy;  Laterality: N/A;  . ESOPHAGOGASTRODUODENOSCOPY (EGD) WITH PROPOFOL N/A 09/16/2014   Procedure: ESOPHAGOGASTRODUODENOSCOPY (EGD) WITH PROPOFOL;  Surgeon: Rogene Houston, MD;  Location[facility];  Service: Endoscopy;  Laterality: N/A;  . MOUTH SURGERY    . POLYPECTOMY  11/20/2018   Procedure: POLYPECTOMY;  Surgeon: Rogene Houston, MD;  [facility];  Service: Endoscopy;;  colon  . Skin graft to gum Right 08/2013  . TONSILLECTOMY AND ADENOIDECTOMY    . TOTAL ABDOMINAL HYSTERECTOMY     Social History   Occupational History    Employer: UNEMPLOYED  Tobacco Use  . Smoking status: Former Smoker    Packs/day: 1.50    Years: 15.00    Pack years: 22.50    Types: Cigarettes    Quit date: 05/22/2006    Years since quitting: 13.5  . Smokeless tobacco: Never Used  Substance and Sexual Activity  . Alcohol use: No    Alcohol/week: 0.0 standard drinks  . Drug use: No  . Sexual activity: Never    Birth control/protection: Other-see comments    Comment: Hysterectomy

## 2020-01-13 DIAGNOSIS — W19XXXA Unspecified fall, initial encounter: Secondary | ICD-10-CM | POA: Diagnosis not present

## 2020-01-13 DIAGNOSIS — R2232 Localized swelling, mass and lump, left upper limb: Secondary | ICD-10-CM | POA: Diagnosis not present

## 2020-02-14 DIAGNOSIS — Z1231 Encounter for screening mammogram for malignant neoplasm of breast: Secondary | ICD-10-CM | POA: Diagnosis not present

## 2020-03-07 DIAGNOSIS — N390 Urinary tract infection, site not specified: Secondary | ICD-10-CM | POA: Diagnosis not present

## 2020-03-07 DIAGNOSIS — R233 Spontaneous ecchymoses: Secondary | ICD-10-CM | POA: Diagnosis not present

## 2020-03-07 DIAGNOSIS — W19XXXA Unspecified fall, initial encounter: Secondary | ICD-10-CM | POA: Diagnosis not present

## 2020-03-07 DIAGNOSIS — G40909 Epilepsy, unspecified, not intractable, without status epilepticus: Secondary | ICD-10-CM | POA: Diagnosis not present

## 2020-03-07 DIAGNOSIS — M8580 Other specified disorders of bone density and structure, unspecified site: Secondary | ICD-10-CM | POA: Diagnosis not present

## 2020-03-07 DIAGNOSIS — Z8669 Personal history of other diseases of the nervous system and sense organs: Secondary | ICD-10-CM | POA: Diagnosis not present

## 2020-03-07 DIAGNOSIS — S93402D Sprain of unspecified ligament of left ankle, subsequent encounter: Secondary | ICD-10-CM | POA: Diagnosis not present

## 2020-03-07 DIAGNOSIS — Z86010 Personal history of colonic polyps: Secondary | ICD-10-CM | POA: Diagnosis not present

## 2020-03-07 DIAGNOSIS — D696 Thrombocytopenia, unspecified: Secondary | ICD-10-CM | POA: Diagnosis not present

## 2020-03-07 DIAGNOSIS — E860 Dehydration: Secondary | ICD-10-CM | POA: Diagnosis not present

## 2020-03-07 DIAGNOSIS — Z6826 Body mass index (BMI) 26.0-26.9, adult: Secondary | ICD-10-CM | POA: Diagnosis not present

## 2020-03-07 DIAGNOSIS — J309 Allergic rhinitis, unspecified: Secondary | ICD-10-CM | POA: Diagnosis not present

## 2020-03-07 DIAGNOSIS — R2232 Localized swelling, mass and lump, left upper limb: Secondary | ICD-10-CM | POA: Diagnosis not present

## 2020-03-07 DIAGNOSIS — R569 Unspecified convulsions: Secondary | ICD-10-CM | POA: Diagnosis not present

## 2020-03-28 ENCOUNTER — Encounter (HOSPITAL_COMMUNITY): Payer: Self-pay | Admitting: Emergency Medicine

## 2020-03-28 ENCOUNTER — Other Ambulatory Visit: Payer: Self-pay

## 2020-03-28 ENCOUNTER — Emergency Department (HOSPITAL_COMMUNITY)
Admission: EM | Admit: 2020-03-28 | Discharge: 2020-03-28 | Disposition: A | Discharge status: Home / Self Care | Payer: Medicare Other | Attending: Emergency Medicine | Admitting: Emergency Medicine

## 2020-03-28 ENCOUNTER — Emergency Department (HOSPITAL_COMMUNITY): Payer: Medicare Other

## 2020-03-28 DIAGNOSIS — R52 Pain, unspecified: Secondary | ICD-10-CM | POA: Diagnosis not present

## 2020-03-28 DIAGNOSIS — Z87891 Personal history of nicotine dependence: Secondary | ICD-10-CM | POA: Diagnosis not present

## 2020-03-28 DIAGNOSIS — Z79899 Other long term (current) drug therapy: Secondary | ICD-10-CM | POA: Diagnosis not present

## 2020-03-28 DIAGNOSIS — F79 Unspecified intellectual disabilities: Secondary | ICD-10-CM | POA: Insufficient documentation

## 2020-03-28 DIAGNOSIS — N898 Other specified noninflammatory disorders of vagina: Secondary | ICD-10-CM | POA: Insufficient documentation

## 2020-03-28 DIAGNOSIS — R1084 Generalized abdominal pain: Secondary | ICD-10-CM | POA: Diagnosis not present

## 2020-03-28 DIAGNOSIS — R58 Hemorrhage, not elsewhere classified: Secondary | ICD-10-CM | POA: Diagnosis not present

## 2020-03-28 DIAGNOSIS — K5900 Constipation, unspecified: Secondary | ICD-10-CM | POA: Diagnosis not present

## 2020-03-28 DIAGNOSIS — R569 Unspecified convulsions: Secondary | ICD-10-CM | POA: Insufficient documentation

## 2020-03-28 DIAGNOSIS — R531 Weakness: Secondary | ICD-10-CM

## 2020-03-28 DIAGNOSIS — R404 Transient alteration of awareness: Secondary | ICD-10-CM | POA: Diagnosis not present

## 2020-03-28 DIAGNOSIS — N899 Noninflammatory disorder of vagina, unspecified: Secondary | ICD-10-CM | POA: Diagnosis not present

## 2020-03-28 LAB — CBC WITH DIFFERENTIAL/PLATELET
Abs Immature Granulocytes: 0.04 10*3/uL (ref 0.00–0.07)
Basophils Absolute: 0 10*3/uL (ref 0.0–0.1)
Basophils Relative: 0 %
Eosinophils Absolute: 0 10*3/uL (ref 0.0–0.5)
Eosinophils Relative: 0 %
HCT: 41.5 % (ref 36.0–46.0)
Hemoglobin: 13.5 g/dL (ref 12.0–15.0)
Immature Granulocytes: 1 %
Lymphocytes Relative: 22 %
Lymphs Abs: 1.8 10*3/uL (ref 0.7–4.0)
MCH: 31.7 pg (ref 26.0–34.0)
MCHC: 32.5 g/dL (ref 30.0–36.0)
MCV: 97.4 fL (ref 80.0–100.0)
Monocytes Absolute: 0.4 10*3/uL (ref 0.1–1.0)
Monocytes Relative: 5 %
Neutro Abs: 6.1 10*3/uL (ref 1.7–7.7)
Neutrophils Relative %: 72 %
Platelets: 143 10*3/uL — ABNORMAL LOW (ref 150–400)
RBC: 4.26 MIL/uL (ref 3.87–5.11)
RDW: 13.2 % (ref 11.5–15.5)
WBC: 8.3 10*3/uL (ref 4.0–10.5)
nRBC: 0 % (ref 0.0–0.2)

## 2020-03-28 LAB — URINALYSIS, ROUTINE W REFLEX MICROSCOPIC
Bilirubin Urine: NEGATIVE
Glucose, UA: NEGATIVE mg/dL
Hgb urine dipstick: NEGATIVE
Ketones, ur: 5 mg/dL — AB
Leukocytes,Ua: NEGATIVE
Nitrite: NEGATIVE
Protein, ur: 30 mg/dL — AB
Specific Gravity, Urine: 1.027 (ref 1.005–1.030)
pH: 5 (ref 5.0–8.0)

## 2020-03-28 LAB — COMPREHENSIVE METABOLIC PANEL
ALT: 30 U/L (ref 0–44)
AST: 26 U/L (ref 15–41)
Albumin: 4.4 g/dL (ref 3.5–5.0)
Alkaline Phosphatase: 64 U/L (ref 38–126)
Anion gap: 8 (ref 5–15)
BUN: 28 mg/dL — ABNORMAL HIGH (ref 6–20)
CO2: 27 mmol/L (ref 22–32)
Calcium: 9.7 mg/dL (ref 8.9–10.3)
Chloride: 103 mmol/L (ref 98–111)
Creatinine, Ser: 0.92 mg/dL (ref 0.44–1.00)
GFR, Estimated: >60 mL/min (ref >60)
Glucose, Bld: 117 mg/dL — ABNORMAL HIGH (ref 70–99)
Potassium: 4 mmol/L (ref 3.5–5.1)
Sodium: 138 mmol/L (ref 135–145)
Total Bilirubin: 0.8 mg/dL (ref 0.3–1.2)
Total Protein: 7.9 g/dL (ref 6.5–8.1)

## 2020-03-28 LAB — WET PREP, GENITAL
Clue Cells Wet Prep HPF POC: NONE SEEN
Sperm: NONE SEEN
Trich, Wet Prep: NONE SEEN
Yeast Wet Prep HPF POC: NONE SEEN

## 2020-03-28 LAB — LIPASE, BLOOD: Lipase: 23 U/L (ref 11–51)

## 2020-03-28 LAB — POC OCCULT BLOOD, ED: Fecal Occult Bld: NEGATIVE

## 2020-03-28 MED ORDER — FLUCONAZOLE 150 MG PO TABS: ORAL_TABLET | ORAL | 0 refills | Status: DC

## 2020-03-28 MED ORDER — METRONIDAZOLE 500 MG PO TABS: 500.0000 mg | ORAL_TABLET | Freq: Two times a day (BID) | ORAL | 0 refills | Status: DC

## 2020-03-28 NOTE — Discharge Instructions (Addendum)
Please pick up medications and take as prescribed. This is to cover for both bacterial vaginosis and a yeast infection. The bleeding appears to be coming from the outside of the vaginal area likely related to vaginal irritation secondary to the bacterial infection.   I would recommend taking Miralax daily to help with constipation. Drink plenty of water to stay hydrated.   Follow up with PCP regarding your ED visit today. Return to the ED IMMEDIATELY for any worsening symptoms including worsening abdominal pain, inability to pass gas, constipation lasting longer than 1 week, vomiting, or any other new/concerning symptoms.

## 2020-03-28 NOTE — ED Triage Notes (Signed)
Patient is cognitively impaired, and lives with mother, per sister at bedside, patient is having c/o of vaginal bleeding, and was on the way to GYN for pap smear and sat down on the ground due to weakness.  Family unsure if she is having vaginal bleeding or constipation from straining.

## 2020-03-28 NOTE — ED Provider Notes (Signed)
[facility] EMERGENCY DEPARTMENT Provider Note   CSN: [id number] Arrival date & time: 03/28/20  1238     History Chief Complaint  Patient presents with  . Weakness    Maria [name] is a 59 y.o. female with PMHx Intellectual disability, constipation, and seizures who presents to the ED today with family with concern for vaginal vs rectal bleeding. Sister is at bedside and provides most of the history - was told by pt and their mother (whom pt lives with) that she noticed blood while wiping 6 days ago. Pt's mother also noticed a small amount of blood in her underwear the other day and they assumed the blood was coming from her vagina - they scheduled an appointment with the gynecologist today for further evaluation when pt became very weak and had to sit down on the front porch steps stating she could not walk any further. This prompted them to come to the ED today. Pt has also been complaining of some lower abdominal pain and nausea for the past few days. Pt cannot recall when her last bowel movement was but does mention remembering straining recently. No fevers, chills, vomiting. PSHx includes hysterectomy. Pt is not sexually active.   The history is provided by the patient, a relative and medical records. The history is limited by a developmental delay.       Past Medical History:  Diagnosis Date  . Constipation   . Dysphagia   . Fracture    R foot  . GERD (gastroesophageal reflux disease)   . History of shingles 10/2016  . Mental retardation    lesion in head  . Seizures ([place])    "not fully controlled on max doses of meds" (Neuro ofc note 12/2014)  . Thrombocytopenia ([place])   . Tremor     Patient Active Problem List   Diagnosis Date Noted  . Pain in left ankle and joints of left foot 12/02/2019  . Positive colorectal cancer screening using Cologuard test 10/28/2018  . Posterior tibial tendinitis, right leg 04/23/2016  . Pain in right foot 04/12/2016  . Acute  encephalopathy   . Myoclonus   . Frequent falls 11/09/2015  . Fall 11/09/2015  . Chest pain 03/16/2014  . Upper abdominal pain 03/16/2014  . Seizure disorder ([place]) 03/16/2014  . 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 Generalized convulsive epilepsy ([place]) 10/06/2012  . Encounter for therapeutic drug monitoring 10/06/2012  . Mental retardation   . Thrombocytopenia ([place]) 05/23/2011  . Seizures ([place]) 03/21/2011  . GERD (gastroesophageal reflux disease) 03/21/2011  . CLOSED FRACTURE OF ACROMIAL END OF CLAVICLE 07/13/2008    Past Surgical History:  Procedure Laterality Date  . BIOPSY  09/16/2014   Procedure: BIOPSY;  Surgeon: Rogene Houston, MD;  Location: [facility] ORS;  Service: Endoscopy;;  . CATARACT EXTRACTION     both eyes, May of 2015  . COLONOSCOPY    . COLONOSCOPY WITH PROPOFOL N/A 11/20/2018   Procedure: COLONOSCOPY WITH PROPOFOL;  Surgeon: Rogene Houston, MD;  Location: [facility] ENDO SUITE;  Service: Endoscopy;  Laterality: N/A;  . ESOPHAGEAL DILATION N/A 09/16/2014   Procedure: ESOPHAGEAL DILATION WITH 54FR MALONEY DILATOR;  Surgeon: Rogene Houston, MD;  Location: [facility];  Service: Endoscopy;  Laterality: N/A;  . ESOPHAGOGASTRODUODENOSCOPY (EGD) WITH PROPOFOL N/A 09/16/2014   Procedure: ESOPHAGOGASTRODUODENOSCOPY (EGD) WITH PROPOFOL;  Surgeon: Rogene Houston, MD;  Location: [facility];  Service: Endoscopy;  Laterality: N/A;  . MOUTH SURGERY    . POLYPECTOMY  11/20/2018   Procedure: POLYPECTOMY;  Surgeon: Rogene Houston, MD;  Location: [facility];  Service: Endoscopy;;  colon  . Skin graft to gum Right 08/2013  . TONSILLECTOMY AND ADENOIDECTOMY    . TOTAL ABDOMINAL HYSTERECTOMY       OB History   No obstetric history on file.     Family History  Problem Relation Age of Onset  . High Cholesterol Mother   . High blood pressure Mother   . Diabetes Father     Social History   Tobacco Use  . Smoking status: Former Smoker    Packs/day: 1.50    Years: 15.00    Pack  years: 22.50    Types: Cigarettes    Quit date: 05/22/2006    Years since quitting: 13.8  . Smokeless tobacco: Never Used  Substance Use Topics  . Alcohol use: No    Alcohol/week: 0.0 standard drinks  . Drug use: No    Home Medications Prior to Admission medications   Medication Sig Start Date End Date Taking? Authorizing Provider  atorvastatin (LIPITOR) 20 MG tablet Take 20 mg by mouth daily. 08/24/18   [provider]  Calcium Carbonate-Vitamin D3 (CALCIUM 600-D) 600-400 MG-UNIT TABS Take 1 tablet by mouth 2 (two) times a day.    [provider]  clonazePAM (KLONOPIN) 0.5 MG tablet Take 1 tablet (0.5 mg total) by mouth 2 (two) times daily. 10/20/19   Ward Givens, NP  divalproex (DEPAKOTE ER) 500 MG 24 hr tablet Take 1 tablet (500 mg total) by mouth 2 (two) times daily. 11/25/19   Suzzanne Cloud, NP  estradiol (VIVELLE-DOT) 0.05 MG/24HR patch Place 1 patch onto the skin 2 (two) times a week. On Wednesday and Saturday    [provider]  fluconazole (DIFLUCAN) 150 MG tablet Take 1 tablet on Day 1 and take additional 1 tablet on Day 4 if irritation continues. 03/28/20   Alroy Bailiff, Kathy Wahid, PA-C  ibuprofen (ADVIL) 200 MG tablet Take 400 mg by mouth every 6 (six) hours as needed for moderate pain.    [provider]  lacosamide (VIMPAT) 200 MG TABS tablet Take 1 tablet (200 mg total) by mouth 2 (two) times daily. 11/25/19   Suzzanne Cloud, NP  levocetirizine (XYZAL) 5 MG tablet Take 5 mg by mouth every evening.     [provider]  metroNIDAZOLE (FLAGYL) 500 MG tablet Take 1 tablet (500 mg total) by mouth 2 (two) times daily. 03/28/20   Eustaquio Maize, PA-C  omeprazole (PRILOSEC) 40 MG capsule Take 1 capsule (40 mg total) by mouth daily. 11/03/19   Minus Liberty, PA-C  oxybutynin (DITROPAN-XL) 5 MG 24 hr tablet Take 5 mg by mouth at bedtime.     [provider]  Pediatric Multiple Vit-C-FA (PEDIATRIC MULTIVITAMIN) chewable tablet Chew 1  tablet by mouth daily.      [provider]    Allergies    Codeine and Lamictal [lamotrigine]  Review of Systems   Review of Systems  Constitutional: Negative for chills and fever.  Gastrointestinal: Positive for abdominal pain, constipation and nausea. Negative for vomiting.  Genitourinary:       ?vaginal bleeding  Neurological: Positive for weakness (generalized).  All other systems reviewed and are negative.   Physical Exam Updated Vital Signs BP 136/81 (BP Location: Right Arm)   Pulse 72   Temp 98 F (36.7 C) (Oral)   Resp 16   Ht 5\' 6"  (1.676 m)   Wt 67.1  kg   SpO2 95%   BMI 23.88 kg/m   Physical Exam Vitals and nursing note reviewed.  Constitutional:      Appearance: She is not ill-appearing.  HENT:     Head: Normocephalic and atraumatic.  Eyes:     Conjunctiva/sclera: Conjunctivae normal.  Cardiovascular:     Rate and Rhythm: Normal rate and regular rhythm.  Pulmonary:     Effort: Pulmonary effort is normal.     Breath sounds: Normal breath sounds. No wheezing, rhonchi or rales.  Abdominal:     Palpations: Abdomen is soft.     Tenderness: There is abdominal tenderness. There is no guarding or rebound.     Comments: Soft, mild distention however sister states this is baseline, active BS, + very minimal diffuse abdominal TTP, no r/g/r, neg murphy's, neg mcburney's, no CVA TTP  Genitourinary:    Rectum: Guaiac result negative.     Comments: Rectal exam performed - no external hemorrhoids appreciated. Small pelleted stool palpated in vault.   Chaperone present for exam. No obvious signs of external or internal bleeding however pt with moderate amount of thin yellowish vaginal discharge in vault. When wiping patient clean she began having a small amount of bleed near the introitus.  Musculoskeletal:     Cervical back: Neck supple.  Skin:    General: Skin is warm and dry.  Neurological:     Mental Status: She is alert.     ED Results /  Procedures / Treatments   Labs (all labs ordered are listed, but only abnormal results are displayed) Labs Reviewed  WET PREP, GENITAL - Abnormal; Notable for the following components:      Result Value   WBC, Wet Prep HPF POC MANY (*)    All other components within normal limits  URINALYSIS, ROUTINE W REFLEX MICROSCOPIC - Abnormal; Notable for the following components:   Ketones, ur 5 (*)    Protein, ur 30 (*)    Bacteria, UA RARE (*)    All other components within normal limits  COMPREHENSIVE METABOLIC PANEL - Abnormal; Notable for the following components:   Glucose, Bld 117 (*)    BUN 28 (*)    All other components within normal limits  CBC WITH DIFFERENTIAL/PLATELET - Abnormal; Notable for the following components:   Platelets 143 (*)    All other components within normal limits  LIPASE, BLOOD  CBC WITH DIFFERENTIAL/PLATELET  POC OCCULT BLOOD, ED    EKG None  Radiology No results found.  Procedures Procedures (including critical care time)  Medications Ordered in ED Medications - No data to display  ED Course  I have reviewed the triage vital signs and the nursing notes.  Pertinent labs & imaging results that were available during my care of the patient were reviewed by me and considered in my medical decision making (see chart for details).    MDM Rules/Calculators/A&P                          59 year old female with a history of intellectual disability presents to the ED today complaining of generalized weakness.  Patient was on the way to gynecology office today with concern for vaginal bleeding that patient has noticed with wiping for the past 6 days.  Patient reported to family she believes it is coming from her vagina however they are unsure given patient's ID.  Mother lives with patient reports she did see a small amount of blood  in her underwear in the past couple of days.  Patient does have a history of abdominal hysterectomy.  Patient is not sexually  active.  She also reports some lower abdominal pain with constipation.  On arrival to the ED vitals are stable.  I was in the room while patient was getting changed, she had a pad on her underwear which she normally wears, there are no signs of bleeding.  I did a quick external genital exam with chaperone at the room with no obvious source of bleeding.  I then proceeded with a rectal exam, patient does have a small amount of pellet of stool in vault however guaiac negative.  No signs of external hemorrhoids that could be causing the bleeding.  Will need to perform pelvic exam at this time for further assessment.  We will plan to check for yeast, bacterial vaginosis as this could be causing some irritation and bleeding.  Patient also has some lower abdominal tenderness palpation however very mild, she has active bowel sounds.  No other surgeries besides abdominal hysterectomy on the abdomen.  Will work-up with labs at this time and reassess.   Pelvic exam performed, there were no signs of internal or external bleeding with speculum however patient did have a moderate amount of thin yellowish discharge in vault and outside near the introitus.  Once this was wiped away patient began having a small amount of bleeding near the posterior aspect of the introitus which I suspect is where she has been noticing the bleeding from it.  Patient does endorse that she has been itching and she may very well have likely been rubbing this area with her hand or toilet paper causing some irritation.  Will await on wet prep results at this time however findings more consistent with BV and yeast given consistency of the discharge.   Wet prep has returned with many white blood cells however no yeast or clue cells appreciated.  Despite this we will plan to treat for BV at this time and give Diflucan prescription if no improvement.   Labwork reassuring. CBC without leukocytosis and hgb stable at 13.5. No concern for acute blood loss  anemia at this time. CMP without electrolyte abnormalities. Lipase 23.   Sister would like to take patient home at this time. I had initially ordered an acute abdominal series given pt's lower abdominal pain and constipation however will cancel; I have low suspicion for SBO at this time and have recommend miralax in the outpatient setting with PCP follow up with strict return precautions. Sister is in agreement with plan and pt stable for discharge home.   This note was prepared using Dragon voice recognition software and may include unintentional dictation errors due to the inherent limitations of voice recognition software.   Final Clinical Impression(s) / ED Diagnoses Final diagnoses:  Vaginal irritation  Generalized weakness  Constipation, unspecified constipation type    Rx / DC Orders ED Discharge Orders         Ordered    metroNIDAZOLE (FLAGYL) 500 MG tablet  2 times daily        03/28/20 1857    fluconazole (DIFLUCAN) 150 MG tablet        03/28/20 1857           Discharge Instructions     Please pick up medications and take as prescribed. This is to cover for both bacterial vaginosis and a yeast infection. The bleeding appears to be coming from the outside of the  vaginal area likely related to vaginal irritation secondary to the bacterial infection.   I would recommend taking Miralax daily to help with constipation. Drink plenty of water to stay hydrated.   Follow up with PCP regarding your ED visit today. Return to the ED IMMEDIATELY for any worsening symptoms including worsening abdominal pain, inability to pass gas, constipation lasting longer than 1 week, vomiting, or any other new/concerning symptoms.        Eustaquio Maize, PA-C 03/28/20 1902    Maudie Flakes, MD 03/28/20 2007

## 2020-03-28 NOTE — ED Notes (Signed)
No visible blood during assessment

## 2020-04-11 DIAGNOSIS — R102 Pelvic and perineal pain: Secondary | ICD-10-CM | POA: Diagnosis not present

## 2020-04-11 DIAGNOSIS — R309 Painful micturition, unspecified: Secondary | ICD-10-CM | POA: Diagnosis not present

## 2020-04-11 DIAGNOSIS — Z124 Encounter for screening for malignant neoplasm of cervix: Secondary | ICD-10-CM | POA: Diagnosis not present

## 2020-04-11 DIAGNOSIS — N390 Urinary tract infection, site not specified: Secondary | ICD-10-CM | POA: Diagnosis not present

## 2020-04-13 ENCOUNTER — Encounter (HOSPITAL_COMMUNITY): Payer: Self-pay | Admitting: Emergency Medicine

## 2020-04-13 ENCOUNTER — Emergency Department (HOSPITAL_COMMUNITY)
Admission: EM | Admit: 2020-04-13 | Discharge: 2020-04-13 | Disposition: A | Discharge status: Home / Self Care | Payer: Medicare Other | Source: Home / Self Care

## 2020-04-13 ENCOUNTER — Other Ambulatory Visit: Payer: Self-pay

## 2020-04-13 ENCOUNTER — Other Ambulatory Visit: Payer: Self-pay | Admitting: Neurology

## 2020-04-13 DIAGNOSIS — R6521 Severe sepsis with septic shock: Secondary | ICD-10-CM | POA: Diagnosis not present

## 2020-04-13 DIAGNOSIS — N12 Tubulo-interstitial nephritis, not specified as acute or chronic: Secondary | ICD-10-CM | POA: Diagnosis not present

## 2020-04-13 DIAGNOSIS — N151 Renal and perinephric abscess: Secondary | ICD-10-CM | POA: Diagnosis not present

## 2020-04-13 DIAGNOSIS — A4151 Sepsis due to Escherichia coli [E. coli]: Secondary | ICD-10-CM | POA: Diagnosis not present

## 2020-04-13 DIAGNOSIS — Z5321 Procedure and treatment not carried out due to patient leaving prior to being seen by health care provider: Secondary | ICD-10-CM | POA: Insufficient documentation

## 2020-04-13 DIAGNOSIS — I700 Atherosclerosis of aorta: Secondary | ICD-10-CM | POA: Diagnosis not present

## 2020-04-13 DIAGNOSIS — R1032 Left lower quadrant pain: Secondary | ICD-10-CM | POA: Insufficient documentation

## 2020-04-13 DIAGNOSIS — J690 Pneumonitis due to inhalation of food and vomit: Secondary | ICD-10-CM | POA: Diagnosis not present

## 2020-04-13 DIAGNOSIS — Z90710 Acquired absence of both cervix and uterus: Secondary | ICD-10-CM | POA: Diagnosis not present

## 2020-04-13 DIAGNOSIS — R569 Unspecified convulsions: Secondary | ICD-10-CM

## 2020-04-13 DIAGNOSIS — Z20822 Contact with and (suspected) exposure to covid-19: Secondary | ICD-10-CM | POA: Diagnosis not present

## 2020-04-13 DIAGNOSIS — N261 Atrophy of kidney (terminal): Secondary | ICD-10-CM | POA: Diagnosis not present

## 2020-04-13 DIAGNOSIS — Z66 Do not resuscitate: Secondary | ICD-10-CM | POA: Diagnosis not present

## 2020-04-13 DIAGNOSIS — N281 Cyst of kidney, acquired: Secondary | ICD-10-CM | POA: Diagnosis not present

## 2020-04-13 DIAGNOSIS — G9341 Metabolic encephalopathy: Secondary | ICD-10-CM | POA: Diagnosis not present

## 2020-04-13 MED ORDER — CLONAZEPAM 0.5 MG PO TABS: 0.5000 mg | ORAL_TABLET | Freq: Two times a day (BID) | ORAL | 1 refills | Status: DC

## 2020-04-13 NOTE — ED Triage Notes (Signed)
Pt c/o lower left abd pain x weeks; pt was recently diagnosed with UTI and given bactrim and has had 3 doses; pt has u/s scheduled tomorrow

## 2020-04-13 NOTE — Addendum Note (Signed)
Addended by: Brandon Melnick on: 04/13/2020 02:15 PM   Modules accepted: Orders

## 2020-04-13 NOTE — Telephone Encounter (Signed)
Pt is requesting a refill for clonazePAM (KLONOPIN) 0.5 MG tablet.  Pharmacy: [facility]

## 2020-04-14 ENCOUNTER — Inpatient Hospital Stay (HOSPITAL_COMMUNITY): Payer: Medicare Other

## 2020-04-14 ENCOUNTER — Encounter (HOSPITAL_COMMUNITY): Payer: Self-pay

## 2020-04-14 ENCOUNTER — Emergency Department (HOSPITAL_COMMUNITY): Payer: Medicare Other

## 2020-04-14 ENCOUNTER — Inpatient Hospital Stay (HOSPITAL_COMMUNITY)
Admission: EM | Admit: 2020-04-14 | Discharge: 2020-05-05 | DRG: 871 | Disposition: A | Discharge status: Rehabilitation Facility | Payer: Medicare Other | Attending: Internal Medicine | Admitting: Internal Medicine

## 2020-04-14 ENCOUNTER — Other Ambulatory Visit: Payer: Self-pay

## 2020-04-14 DIAGNOSIS — K222 Esophageal obstruction: Secondary | ICD-10-CM | POA: Diagnosis present

## 2020-04-14 DIAGNOSIS — Z515 Encounter for palliative care: Secondary | ICD-10-CM

## 2020-04-14 DIAGNOSIS — G9341 Metabolic encephalopathy: Secondary | ICD-10-CM | POA: Diagnosis present

## 2020-04-14 DIAGNOSIS — Z4682 Encounter for fitting and adjustment of non-vascular catheter: Secondary | ICD-10-CM | POA: Diagnosis not present

## 2020-04-14 DIAGNOSIS — Z885 Allergy status to narcotic agent status: Secondary | ICD-10-CM | POA: Diagnosis not present

## 2020-04-14 DIAGNOSIS — A4151 Sepsis due to Escherichia coli [E. coli]: Principal | ICD-10-CM | POA: Diagnosis present

## 2020-04-14 DIAGNOSIS — D638 Anemia in other chronic diseases classified elsewhere: Secondary | ICD-10-CM | POA: Diagnosis present

## 2020-04-14 DIAGNOSIS — R6521 Severe sepsis with septic shock: Secondary | ICD-10-CM | POA: Diagnosis present

## 2020-04-14 DIAGNOSIS — Z1612 Extended spectrum beta lactamase (ESBL) resistance: Secondary | ICD-10-CM | POA: Diagnosis not present

## 2020-04-14 DIAGNOSIS — W19XXXA Unspecified fall, initial encounter: Secondary | ICD-10-CM | POA: Diagnosis present

## 2020-04-14 DIAGNOSIS — Z87891 Personal history of nicotine dependence: Secondary | ICD-10-CM | POA: Diagnosis not present

## 2020-04-14 DIAGNOSIS — Z7189 Other specified counseling: Secondary | ICD-10-CM | POA: Diagnosis not present

## 2020-04-14 DIAGNOSIS — E7220 Disorder of urea cycle metabolism, unspecified: Secondary | ICD-10-CM | POA: Diagnosis present

## 2020-04-14 DIAGNOSIS — R112 Nausea with vomiting, unspecified: Secondary | ICD-10-CM | POA: Diagnosis not present

## 2020-04-14 DIAGNOSIS — J690 Pneumonitis due to inhalation of food and vomit: Secondary | ICD-10-CM | POA: Diagnosis not present

## 2020-04-14 DIAGNOSIS — A419 Sepsis, unspecified organism: Secondary | ICD-10-CM | POA: Diagnosis present

## 2020-04-14 DIAGNOSIS — R0902 Hypoxemia: Secondary | ICD-10-CM

## 2020-04-14 DIAGNOSIS — N12 Tubulo-interstitial nephritis, not specified as acute or chronic: Secondary | ICD-10-CM | POA: Diagnosis present

## 2020-04-14 DIAGNOSIS — L89116 Pressure-induced deep tissue damage of right upper back: Secondary | ICD-10-CM | POA: Diagnosis not present

## 2020-04-14 DIAGNOSIS — Y92009 Unspecified place in unspecified non-institutional (private) residence as the place of occurrence of the external cause: Secondary | ICD-10-CM | POA: Diagnosis not present

## 2020-04-14 DIAGNOSIS — R251 Tremor, unspecified: Secondary | ICD-10-CM | POA: Diagnosis present

## 2020-04-14 DIAGNOSIS — E878 Other disorders of electrolyte and fluid balance, not elsewhere classified: Secondary | ICD-10-CM | POA: Diagnosis not present

## 2020-04-14 DIAGNOSIS — R6250 Unspecified lack of expected normal physiological development in childhood: Secondary | ICD-10-CM | POA: Diagnosis present

## 2020-04-14 DIAGNOSIS — K5900 Constipation, unspecified: Secondary | ICD-10-CM | POA: Diagnosis present

## 2020-04-14 DIAGNOSIS — R52 Pain, unspecified: Secondary | ICD-10-CM | POA: Diagnosis not present

## 2020-04-14 DIAGNOSIS — Z83438 Family history of other disorder of lipoprotein metabolism and other lipidemia: Secondary | ICD-10-CM | POA: Diagnosis not present

## 2020-04-14 DIAGNOSIS — I313 Pericardial effusion (noninflammatory): Secondary | ICD-10-CM | POA: Diagnosis present

## 2020-04-14 DIAGNOSIS — E860 Dehydration: Secondary | ICD-10-CM | POA: Diagnosis present

## 2020-04-14 DIAGNOSIS — R1310 Dysphagia, unspecified: Secondary | ICD-10-CM

## 2020-04-14 DIAGNOSIS — D649 Anemia, unspecified: Secondary | ICD-10-CM | POA: Diagnosis present

## 2020-04-14 DIAGNOSIS — R509 Fever, unspecified: Secondary | ICD-10-CM | POA: Diagnosis present

## 2020-04-14 DIAGNOSIS — Z66 Do not resuscitate: Secondary | ICD-10-CM | POA: Diagnosis present

## 2020-04-14 DIAGNOSIS — N151 Renal and perinephric abscess: Secondary | ICD-10-CM | POA: Diagnosis present

## 2020-04-14 DIAGNOSIS — F79 Unspecified intellectual disabilities: Secondary | ICD-10-CM

## 2020-04-14 DIAGNOSIS — R1032 Left lower quadrant pain: Secondary | ICD-10-CM | POA: Diagnosis present

## 2020-04-14 DIAGNOSIS — R059 Cough, unspecified: Secondary | ICD-10-CM

## 2020-04-14 DIAGNOSIS — T50905A Adverse effect of unspecified drugs, medicaments and biological substances, initial encounter: Secondary | ICD-10-CM | POA: Diagnosis not present

## 2020-04-14 DIAGNOSIS — D696 Thrombocytopenia, unspecified: Secondary | ICD-10-CM | POA: Diagnosis present

## 2020-04-14 DIAGNOSIS — R7881 Bacteremia: Secondary | ICD-10-CM | POA: Diagnosis not present

## 2020-04-14 DIAGNOSIS — R451 Restlessness and agitation: Secondary | ICD-10-CM | POA: Diagnosis not present

## 2020-04-14 DIAGNOSIS — I340 Nonrheumatic mitral (valve) insufficiency: Secondary | ICD-10-CM | POA: Diagnosis not present

## 2020-04-14 DIAGNOSIS — Z20822 Contact with and (suspected) exposure to covid-19: Secondary | ICD-10-CM | POA: Diagnosis present

## 2020-04-14 DIAGNOSIS — K5909 Other constipation: Secondary | ICD-10-CM | POA: Diagnosis present

## 2020-04-14 DIAGNOSIS — G9340 Encephalopathy, unspecified: Secondary | ICD-10-CM | POA: Diagnosis not present

## 2020-04-14 DIAGNOSIS — I5032 Chronic diastolic (congestive) heart failure: Secondary | ICD-10-CM | POA: Diagnosis not present

## 2020-04-14 DIAGNOSIS — R400 Somnolence: Secondary | ICD-10-CM | POA: Diagnosis not present

## 2020-04-14 DIAGNOSIS — J9601 Acute respiratory failure with hypoxia: Secondary | ICD-10-CM | POA: Diagnosis not present

## 2020-04-14 DIAGNOSIS — R6520 Severe sepsis without septic shock: Secondary | ICD-10-CM | POA: Diagnosis not present

## 2020-04-14 DIAGNOSIS — R109 Unspecified abdominal pain: Secondary | ICD-10-CM | POA: Diagnosis not present

## 2020-04-14 DIAGNOSIS — R296 Repeated falls: Secondary | ICD-10-CM | POA: Diagnosis not present

## 2020-04-14 DIAGNOSIS — Z6821 Body mass index (BMI) 21.0-21.9, adult: Secondary | ICD-10-CM

## 2020-04-14 DIAGNOSIS — N179 Acute kidney failure, unspecified: Secondary | ICD-10-CM | POA: Diagnosis not present

## 2020-04-14 DIAGNOSIS — D72829 Elevated white blood cell count, unspecified: Secondary | ICD-10-CM | POA: Diagnosis present

## 2020-04-14 DIAGNOSIS — E440 Moderate protein-calorie malnutrition: Secondary | ICD-10-CM | POA: Diagnosis not present

## 2020-04-14 DIAGNOSIS — G40919 Epilepsy, unspecified, intractable, without status epilepticus: Secondary | ICD-10-CM | POA: Diagnosis not present

## 2020-04-14 DIAGNOSIS — G40309 Generalized idiopathic epilepsy and epileptic syndromes, not intractable, without status epilepticus: Secondary | ICD-10-CM | POA: Diagnosis present

## 2020-04-14 DIAGNOSIS — R269 Unspecified abnormalities of gait and mobility: Secondary | ICD-10-CM | POA: Diagnosis present

## 2020-04-14 DIAGNOSIS — Z79899 Other long term (current) drug therapy: Secondary | ICD-10-CM | POA: Diagnosis not present

## 2020-04-14 DIAGNOSIS — K219 Gastro-esophageal reflux disease without esophagitis: Secondary | ICD-10-CM | POA: Diagnosis not present

## 2020-04-14 DIAGNOSIS — I700 Atherosclerosis of aorta: Secondary | ICD-10-CM | POA: Diagnosis not present

## 2020-04-14 DIAGNOSIS — Z90710 Acquired absence of both cervix and uterus: Secondary | ICD-10-CM | POA: Diagnosis not present

## 2020-04-14 DIAGNOSIS — J189 Pneumonia, unspecified organism: Secondary | ICD-10-CM | POA: Diagnosis not present

## 2020-04-14 DIAGNOSIS — G40409 Other generalized epilepsy and epileptic syndromes, not intractable, without status epilepticus: Secondary | ICD-10-CM | POA: Diagnosis present

## 2020-04-14 DIAGNOSIS — R410 Disorientation, unspecified: Secondary | ICD-10-CM | POA: Diagnosis not present

## 2020-04-14 DIAGNOSIS — R000 Tachycardia, unspecified: Secondary | ICD-10-CM | POA: Diagnosis present

## 2020-04-14 DIAGNOSIS — Z833 Family history of diabetes mellitus: Secondary | ICD-10-CM

## 2020-04-14 DIAGNOSIS — E870 Hyperosmolality and hypernatremia: Secondary | ICD-10-CM | POA: Diagnosis not present

## 2020-04-14 DIAGNOSIS — G40909 Epilepsy, unspecified, not intractable, without status epilepticus: Secondary | ICD-10-CM

## 2020-04-14 DIAGNOSIS — Z452 Encounter for adjustment and management of vascular access device: Secondary | ICD-10-CM | POA: Diagnosis not present

## 2020-04-14 DIAGNOSIS — J811 Chronic pulmonary edema: Secondary | ICD-10-CM | POA: Diagnosis not present

## 2020-04-14 DIAGNOSIS — L8990 Pressure ulcer of unspecified site, unspecified stage: Secondary | ICD-10-CM | POA: Insufficient documentation

## 2020-04-14 DIAGNOSIS — R1084 Generalized abdominal pain: Secondary | ICD-10-CM | POA: Diagnosis not present

## 2020-04-14 DIAGNOSIS — R54 Age-related physical debility: Secondary | ICD-10-CM | POA: Diagnosis present

## 2020-04-14 DIAGNOSIS — R233 Spontaneous ecchymoses: Secondary | ICD-10-CM | POA: Diagnosis not present

## 2020-04-14 DIAGNOSIS — N281 Cyst of kidney, acquired: Secondary | ICD-10-CM | POA: Diagnosis not present

## 2020-04-14 DIAGNOSIS — J9600 Acute respiratory failure, unspecified whether with hypoxia or hypercapnia: Secondary | ICD-10-CM | POA: Diagnosis not present

## 2020-04-14 DIAGNOSIS — N390 Urinary tract infection, site not specified: Secondary | ICD-10-CM | POA: Diagnosis not present

## 2020-04-14 DIAGNOSIS — Z888 Allergy status to other drugs, medicaments and biological substances status: Secondary | ICD-10-CM | POA: Diagnosis not present

## 2020-04-14 DIAGNOSIS — N261 Atrophy of kidney (terminal): Secondary | ICD-10-CM | POA: Diagnosis not present

## 2020-04-14 DIAGNOSIS — A499 Bacterial infection, unspecified: Secondary | ICD-10-CM | POA: Diagnosis not present

## 2020-04-14 DIAGNOSIS — N2889 Other specified disorders of kidney and ureter: Secondary | ICD-10-CM | POA: Diagnosis not present

## 2020-04-14 DIAGNOSIS — R5381 Other malaise: Secondary | ICD-10-CM | POA: Diagnosis not present

## 2020-04-14 DIAGNOSIS — R569 Unspecified convulsions: Secondary | ICD-10-CM

## 2020-04-14 DIAGNOSIS — R1110 Vomiting, unspecified: Secondary | ICD-10-CM | POA: Diagnosis not present

## 2020-04-14 DIAGNOSIS — R739 Hyperglycemia, unspecified: Secondary | ICD-10-CM | POA: Diagnosis present

## 2020-04-14 DIAGNOSIS — R531 Weakness: Secondary | ICD-10-CM | POA: Diagnosis not present

## 2020-04-14 DIAGNOSIS — B9620 Unspecified Escherichia coli [E. coli] as the cause of diseases classified elsewhere: Secondary | ICD-10-CM | POA: Diagnosis not present

## 2020-04-14 DIAGNOSIS — E785 Hyperlipidemia, unspecified: Secondary | ICD-10-CM | POA: Diagnosis present

## 2020-04-14 DIAGNOSIS — T17908A Unspecified foreign body in respiratory tract, part unspecified causing other injury, initial encounter: Secondary | ICD-10-CM

## 2020-04-14 DIAGNOSIS — J9811 Atelectasis: Secondary | ICD-10-CM | POA: Diagnosis not present

## 2020-04-14 DIAGNOSIS — D509 Iron deficiency anemia, unspecified: Secondary | ICD-10-CM | POA: Diagnosis not present

## 2020-04-14 HISTORY — DX: Renal and perinephric abscess: N15.1

## 2020-04-14 LAB — COMPREHENSIVE METABOLIC PANEL
ALT: 18 U/L (ref 0–44)
AST: 13 U/L — ABNORMAL LOW (ref 15–41)
Albumin: 3.8 g/dL (ref 3.5–5.0)
Alkaline Phosphatase: 74 U/L (ref 38–126)
Anion gap: 12 (ref 5–15)
BUN: 27 mg/dL — ABNORMAL HIGH (ref 6–20)
CO2: 26 mmol/L (ref 22–32)
Calcium: 8.9 mg/dL (ref 8.9–10.3)
Chloride: 99 mmol/L (ref 98–111)
Creatinine, Ser: 1.67 mg/dL — ABNORMAL HIGH (ref 0.44–1.00)
GFR, Estimated: 35 mL/min — ABNORMAL LOW (ref >60)
Glucose, Bld: 118 mg/dL — ABNORMAL HIGH (ref 70–99)
Potassium: 3.6 mmol/L (ref 3.5–5.1)
Sodium: 137 mmol/L (ref 135–145)
Total Bilirubin: 0.6 mg/dL (ref 0.3–1.2)
Total Protein: 7.5 g/dL (ref 6.5–8.1)

## 2020-04-14 LAB — URINALYSIS, ROUTINE W REFLEX MICROSCOPIC
Bilirubin Urine: NEGATIVE
Glucose, UA: NEGATIVE mg/dL
Ketones, ur: 5 mg/dL — AB
Nitrite: NEGATIVE
Protein, ur: 100 mg/dL — AB
Specific Gravity, Urine: 1.016 (ref 1.005–1.030)
WBC, UA: >50 WBC/hpf — ABNORMAL HIGH (ref 0–5)
pH: 5 (ref 5.0–8.0)

## 2020-04-14 LAB — LIPASE, BLOOD: Lipase: 16 U/L (ref 11–51)

## 2020-04-14 LAB — CBC
HCT: 39.2 % (ref 36.0–46.0)
Hemoglobin: 12.7 g/dL (ref 12.0–15.0)
MCH: 31.6 pg (ref 26.0–34.0)
MCHC: 32.4 g/dL (ref 30.0–36.0)
MCV: 97.5 fL (ref 80.0–100.0)
Platelets: 126 10*3/uL — ABNORMAL LOW (ref 150–400)
RBC: 4.02 MIL/uL (ref 3.87–5.11)
RDW: 13.7 % (ref 11.5–15.5)
WBC: 15 10*3/uL — ABNORMAL HIGH (ref 4.0–10.5)
nRBC: 0 % (ref 0.0–0.2)

## 2020-04-14 LAB — RESPIRATORY PANEL BY RT PCR (FLU A&B, COVID)
Influenza A by PCR: NEGATIVE
Influenza B by PCR: NEGATIVE
SARS Coronavirus 2 by RT PCR: NEGATIVE

## 2020-04-14 LAB — HIV ANTIBODY (ROUTINE TESTING W REFLEX): HIV Screen 4th Generation wRfx: NONREACTIVE

## 2020-04-14 LAB — LACTIC ACID, PLASMA: Lactic Acid, Venous: 1.3 mmol/L (ref 0.5–1.9)

## 2020-04-14 MED ORDER — BISACODYL 5 MG PO TBEC: 5.0000 mg | DELAYED_RELEASE_TABLET | Freq: Every day | ORAL | Status: DC | PRN

## 2020-04-14 MED ORDER — SODIUM CHLORIDE 0.9 % IV SOLN: 1.0000 g | Freq: Once | INTRAVENOUS | Status: DC

## 2020-04-14 MED ORDER — HYDROMORPHONE HCL 1 MG/ML IJ SOLN
0.5000 mg | INTRAMUSCULAR | Status: DC | PRN
  Administered 2020-04-14 – 2020-04-15 (×3): 1 mg via INTRAVENOUS
  Filled 2020-04-14 (×4): qty 1

## 2020-04-14 MED ORDER — SODIUM CHLORIDE 0.9 % IV SOLN
2.0000 g | INTRAVENOUS | Status: DC
  Administered 2020-04-15 – 2020-04-16 (×2): 2 g via INTRAVENOUS
  Filled 2020-04-14 (×2): qty 20

## 2020-04-14 MED ORDER — HALOPERIDOL LACTATE 5 MG/ML IJ SOLN
2.0000 mg | Freq: Once | INTRAMUSCULAR | Status: AC
  Administered 2020-04-14: 2 mg via INTRAVENOUS
  Filled 2020-04-14: qty 1

## 2020-04-14 MED ORDER — SODIUM CHLORIDE 0.9 % IV SOLN
1.0000 g | Freq: Once | INTRAVENOUS | Status: AC
  Administered 2020-04-14: 1 g via INTRAVENOUS
  Filled 2020-04-14: qty 10

## 2020-04-14 MED ORDER — ONDANSETRON HCL 4 MG/2ML IJ SOLN
4.0000 mg | Freq: Once | INTRAMUSCULAR | Status: AC
  Administered 2020-04-14: 4 mg via INTRAVENOUS
  Filled 2020-04-14: qty 2

## 2020-04-14 MED ORDER — HYDROMORPHONE HCL 1 MG/ML IJ SOLN: 0.5000 mg | INTRAMUSCULAR | Status: DC | PRN

## 2020-04-14 MED ORDER — SODIUM CHLORIDE 0.9 % IV SOLN: INTRAVENOUS | Status: DC

## 2020-04-14 MED ORDER — ONDANSETRON HCL 4 MG/2ML IJ SOLN
4.0000 mg | Freq: Four times a day (QID) | INTRAMUSCULAR | Status: DC | PRN
  Administered 2020-04-15: 4 mg via INTRAVENOUS
  Filled 2020-04-14: qty 2

## 2020-04-14 MED ORDER — ATORVASTATIN CALCIUM 10 MG PO TABS: 20.0000 mg | ORAL_TABLET | Freq: Every evening | ORAL | Status: DC

## 2020-04-14 MED ORDER — LACOSAMIDE 50 MG PO TABS
200.0000 mg | ORAL_TABLET | Freq: Two times a day (BID) | ORAL | Status: DC
  Administered 2020-04-14: 200 mg via ORAL
  Filled 2020-04-14 (×2): qty 4

## 2020-04-14 MED ORDER — MORPHINE SULFATE (PF) 4 MG/ML IV SOLN
4.0000 mg | Freq: Once | INTRAVENOUS | Status: AC
  Administered 2020-04-14: 4 mg via INTRAVENOUS
  Filled 2020-04-14: qty 1

## 2020-04-14 MED ORDER — HALOPERIDOL LACTATE 5 MG/ML IJ SOLN
1.0000 mg | Freq: Four times a day (QID) | INTRAMUSCULAR | Status: DC | PRN
  Administered 2020-04-14 – 2020-04-15 (×2): 1 mg via INTRAVENOUS
  Filled 2020-04-14 (×2): qty 1

## 2020-04-14 MED ORDER — SODIUM CHLORIDE 0.9 % IV BOLUS
1000.0000 mL | Freq: Once | INTRAVENOUS | Status: AC
  Administered 2020-04-14: 1000 mL via INTRAVENOUS

## 2020-04-14 MED ORDER — ENOXAPARIN SODIUM 40 MG/0.4ML ~~LOC~~ SOLN
40.0000 mg | SUBCUTANEOUS | Status: DC
  Administered 2020-04-15: 40 mg via SUBCUTANEOUS
  Filled 2020-04-14: qty 0.4

## 2020-04-14 MED ORDER — IOHEXOL 300 MG/ML  SOLN
100.0000 mL | Freq: Once | INTRAMUSCULAR | Status: AC | PRN
  Administered 2020-04-14: 100 mL via INTRAVENOUS

## 2020-04-14 MED ORDER — ADULT MULTIVITAMIN W/MINERALS CH
1.0000 | ORAL_TABLET | Freq: Every day | ORAL | Status: DC
  Filled 2020-04-14: qty 1

## 2020-04-14 MED ORDER — DIVALPROEX SODIUM ER 500 MG PO TB24
500.0000 mg | ORAL_TABLET | Freq: Two times a day (BID) | ORAL | Status: DC
  Administered 2020-04-14: 500 mg via ORAL
  Filled 2020-04-14 (×2): qty 1

## 2020-04-14 MED ORDER — ACETAMINOPHEN 325 MG PO TABS
650.0000 mg | ORAL_TABLET | Freq: Once | ORAL | Status: AC
  Administered 2020-04-14: 650 mg via ORAL
  Filled 2020-04-14: qty 2

## 2020-04-14 MED ORDER — ONDANSETRON HCL 4 MG PO TABS: 4.0000 mg | ORAL_TABLET | Freq: Four times a day (QID) | ORAL | Status: DC | PRN

## 2020-04-14 MED ORDER — LORATADINE 10 MG PO TABS: 5.0000 mg | ORAL_TABLET | Freq: Every evening | ORAL | Status: DC

## 2020-04-14 MED ORDER — CLONAZEPAM 0.5 MG PO TABS
0.5000 mg | ORAL_TABLET | Freq: Two times a day (BID) | ORAL | Status: DC
  Filled 2020-04-14 (×2): qty 1

## 2020-04-14 MED ORDER — ACETAMINOPHEN 650 MG RE SUPP
650.0000 mg | Freq: Four times a day (QID) | RECTAL | Status: DC
  Administered 2020-04-15 – 2020-04-16 (×3): 650 mg via RECTAL
  Filled 2020-04-14 (×5): qty 1

## 2020-04-14 MED ORDER — LORAZEPAM 2 MG/ML IJ SOLN
0.5000 mg | Freq: Once | INTRAMUSCULAR | Status: AC
  Administered 2020-04-14: 0.5 mg via INTRAVENOUS
  Filled 2020-04-14: qty 1

## 2020-04-14 MED ORDER — OXYBUTYNIN CHLORIDE ER 5 MG PO TB24
5.0000 mg | ORAL_TABLET | Freq: Every day | ORAL | Status: DC
  Administered 2020-04-14: 5 mg via ORAL
  Filled 2020-04-14 (×5): qty 1

## 2020-04-14 MED ORDER — ACETAMINOPHEN 325 MG PO TABS
650.0000 mg | ORAL_TABLET | Freq: Four times a day (QID) | ORAL | Status: DC
  Administered 2020-04-14 – 2020-04-15 (×3): 650 mg via ORAL
  Filled 2020-04-14 (×5): qty 2

## 2020-04-14 MED ORDER — PANTOPRAZOLE SODIUM 40 MG PO TBEC
40.0000 mg | DELAYED_RELEASE_TABLET | Freq: Every day | ORAL | Status: DC
  Filled 2020-04-14: qty 1

## 2020-04-14 NOTE — H&P (Signed)
History and Physical  [place] GUR:[id number] DOB: [birth year] DOA: 04/14/2020  PCP: Celene Squibb, MD  Patient coming from: Home by [place]  I have personally briefly reviewed patient's old medical records in [place]  Chief Complaint: abdominal pain   Historian: Pt unable to give history, information taken from sister and ED provider and EMS records.   HPI: Maria [name] is a 59 y.o. female with medical history significant for chronic constipation, epilepsy, intellectual disability, GERD, dysphagia, tremors who has been dealing with problems with left lower quadrant abdominal pain for several weeks that started out as intermittent and has progressively worsened.  She was seen in the ED recently and diagnosed with a vaginal infection.  She completed a course of metronidazole.  She continued to complain of left lower quadrant abdominal pain and discomfort.  She has had poor appetite and poor oral intake over the last 3 days.  She is having hard bowel movements.  She also has been having fever and her mentation has changed from baseline.  Her family says that she rarely complains of pain but has recently been pointing to the left lower quadrant with grimacing and discomfort.  Her family brought her to the emergency department last night but left after 3 hours without being seen.  They tried to get her to her PCP office today however they were not able to any but the patient had increasing weakness and pain discomfort and EMS brought the patient to the emergency department.  ED Course: Patient arrived appearing acutely ill with fever 100.4 axillary, tachycardic with heart rate around 110.  Her blood pressure was 103/68.  She had good oxygen saturation on room air.  Sodium was 137, potassium 3.6, BUN 27, creatinine 1.67, AST 13, ALT 18, estimated GFR 35, lactic acid 1.3, WBC 15.0, hemoglobin 12.7, platelet count 126.  Urinalysis positive for large leukocytes, protein,  bacteria and greater than 50 WBC per high-power field.  Unfortunately antibiotics have been given prior to blood cultures being drawn.  CT abdomen/pelvis with findings suspicious for left pyelonephritis with a small left renal abscess.  Patient was started on antibiotics and IV fluid hydration and admission was requested.  Pt became acutely agitated while holding in ED and required IV haldol for sedation.    Review of Systems: unable to obtain due to patient condition .   Past Medical History:  Diagnosis Date  . Constipation   . Dysphagia   . Fracture    R foot  . GERD (gastroesophageal reflux disease)   . History of shingles 10/2016  . Mental retardation    lesion in head  . Seizures ([place])    "not fully controlled on max doses of meds" (Neuro ofc note 12/2014)  . Thrombocytopenia ([place])   . Tremor     Past Surgical History:  Procedure Laterality Date  . BIOPSY  09/16/2014   Procedure: BIOPSY;  Surgeon: Rogene Houston, MD;  Location: [facility];  Service: Endoscopy;;  . CATARACT EXTRACTION     both eyes, May of 2015  . COLONOSCOPY    . COLONOSCOPY WITH PROPOFOL N/A 11/20/2018   Procedure: COLONOSCOPY WITH PROPOFOL;  Surgeon: Rogene Houston, MD;  Location: [facility];  Service: Endoscopy;  Laterality: N/A;  . ESOPHAGEAL DILATION N/A 09/16/2014   Procedure: ESOPHAGEAL DILATION WITH 54FR MALONEY DILATOR;  Surgeon: Rogene Houston, MD;  Location: [facility];  Service: Endoscopy;  Laterality: N/A;  . ESOPHAGOGASTRODUODENOSCOPY (EGD)  WITH PROPOFOL N/A 09/16/2014   Procedure: ESOPHAGOGASTRODUODENOSCOPY (EGD) WITH PROPOFOL;  Surgeon: Rogene Houston, MD;  Location: [facility];  Service: Endoscopy;  Laterality: N/A;  . MOUTH SURGERY    . POLYPECTOMY  11/20/2018   Procedure: POLYPECTOMY;  Surgeon: Rogene Houston, MD;  Location: [facility];  Service: Endoscopy;;  colon  . Skin graft to gum Right 08/2013  . TONSILLECTOMY AND ADENOIDECTOMY    . TOTAL ABDOMINAL HYSTERECTOMY       reports that she  quit smoking about 13 years ago. Her smoking use included cigarettes. She has a 22.50 pack-year smoking history. She has never used smokeless tobacco. She reports that she does not drink alcohol and does not use drugs.  Allergies  Allergen Reactions  . Codeine Nausea And Vomiting  . Lamictal [Lamotrigine] Rash    Family History  Problem Relation Age of Onset  . High Cholesterol Mother   . High blood pressure Mother   . Diabetes Father      Prior to Admission medications   Medication Sig Start Date End Date Taking? Authorizing Provider  atorvastatin (LIPITOR) 20 MG tablet Take 20 mg by mouth daily. 08/24/18  Yes [provider]  clonazePAM (KLONOPIN) 0.5 MG tablet Take 1 tablet (0.5 mg total) by mouth 2 (two) times daily. 04/13/20  Yes Suzzanne Cloud, NP  divalproex (DEPAKOTE ER) 500 MG 24 hr tablet Take 1 tablet (500 mg total) by mouth 2 (two) times daily. 11/25/19  Yes Suzzanne Cloud, NP  ibuprofen (ADVIL) 200 MG tablet Take 400 mg by mouth every 6 (six) hours as needed for moderate pain.   Yes [provider]  lacosamide (VIMPAT) 200 MG TABS tablet Take 1 tablet (200 mg total) by mouth 2 (two) times daily. 11/25/19  Yes Suzzanne Cloud, NP  levocetirizine (XYZAL) 5 MG tablet Take 5 mg by mouth every evening.    Yes [provider]  omeprazole (PRILOSEC) 40 MG capsule Take 1 capsule (40 mg total) by mouth daily. 11/03/19  Yes Laurine Blazer A, PA-C  oxybutynin (DITROPAN-XL) 5 MG 24 hr tablet Take 5 mg by mouth at bedtime.    Yes [provider]  Pediatric Multiple Vit-C-FA (PEDIATRIC MULTIVITAMIN) chewable tablet Chew 1 tablet by mouth daily.     Yes [provider]   Physical Exam: Vitals:   04/14/20 1219 04/14/20 1253 04/14/20 1315 04/14/20 1330  BP: 115/70   129/66  Pulse: (!) 107   (!) 113  Resp: 14  20 20   Temp:  (!) 100.4 F (38 C)    TempSrc:  Axillary    SpO2: 91%   90%  Weight:      Height:       Constitutional: Pt sedated.   Lying in bed. NAD, calm, comfortable Eyes: PERRL, lids and conjunctivae normal ENMT: Mucous membranes are moist. Posterior pharynx clear of any exudate or lesions.Normal dentition.  Neck: normal, supple, no masses, no thyromegaly Respiratory: clear to auscultation bilaterally, no wheezing, no crackles. Normal respiratory effort. No accessory muscle use.  Cardiovascular: tachycardic rate, no murmurs / rubs / gallops. No extremity edema. 2+ pedal pulses. No carotid bruits.  Abdomen: LLQ tenderness and left flank pain / CVA tenderness, no masses palpated. No hepatosplenomegaly. Bowel sounds positive.  Musculoskeletal: no clubbing / cyanosis. No joint deformity upper and lower extremities. Good ROM, no contractures. Normal muscle tone.  Skin: no rashes, lesions, ulcers. No induration Neurologic: CN 2-12 grossly intact. Sensation intact, DTR normal. Strength  5/5 in all 4.  Psychiatric: Sedated.   Labs on Admission: I have personally reviewed following labs and imaging studies  CBC: Recent Labs  Lab 04/14/20 0939  WBC 15.0*  HGB 12.7  HCT 39.2  MCV 97.5  PLT 099*   Basic Metabolic Panel: Recent Labs  Lab 04/14/20 0939  NA 137  K 3.6  CL 99  CO2 26  GLUCOSE 118*  BUN 27*  CREATININE 1.67*  CALCIUM 8.9   GFR: Estimated Creatinine Clearance: 34.4 mL/min (A) (by C-G formula based on SCr of 1.67 mg/dL (H)). Liver Function Tests: Recent Labs  Lab 04/14/20 0939  AST 13*  ALT 18  ALKPHOS 74  BILITOT 0.6  PROT 7.5  ALBUMIN 3.8   Recent Labs  Lab 04/14/20 0939  LIPASE 16   No results for input(s): AMMONIA in the last 168 hours. Coagulation Profile: No results for input(s): INR, PROTIME in the last 168 hours. Cardiac Enzymes: No results for input(s): CKTOTAL, CKMB, CKMBINDEX, TROPONINI in the last 168 hours. BNP (last 3 results) No results for input(s): PROBNP in the last 8760 hours. HbA1C: No results for input(s): HGBA1C in the last 72 hours. CBG: No results for  input(s): GLUCAP in the last 168 hours. Lipid Profile: No results for input(s): CHOL, HDL, LDLCALC, TRIG, CHOLHDL, LDLDIRECT in the last 72 hours. Thyroid Function Tests: No results for input(s): TSH, T4TOTAL, FREET4, T3FREE, THYROIDAB in the last 72 hours. Anemia Panel: No results for input(s): VITAMINB12, FOLATE, FERRITIN, TIBC, IRON, RETICCTPCT in the last 72 hours. Urine analysis:    Component Value Date/Time   COLORURINE YELLOW 04/14/2020 1104   APPEARANCEUR HAZY (A) 04/14/2020 1104   LABSPEC 1.016 04/14/2020 1104   PHURINE 5.0 04/14/2020 1104   GLUCOSEU NEGATIVE 04/14/2020 1104   HGBUR SMALL (A) 04/14/2020 1104   BILIRUBINUR NEGATIVE 04/14/2020 1104   KETONESUR 5 (A) 04/14/2020 1104   PROTEINUR 100 (A) 04/14/2020 1104   UROBILINOGEN 0.2 10/26/2014 2340   NITRITE NEGATIVE 04/14/2020 1104   LEUKOCYTESUR LARGE (A) 04/14/2020 1104    Radiological Exams on Admission: CT Abdomen Pelvis W Contrast  Result Date: 04/14/2020 CLINICAL DATA:  Left lower quadrant pain. EXAM: CT ABDOMEN AND PELVIS WITH CONTRAST TECHNIQUE: Multidetector CT imaging of the abdomen and pelvis was performed using the standard protocol following bolus administration of intravenous contrast. CONTRAST:  138mL OMNIPAQUE IOHEXOL 300 MG/ML  SOLN COMPARISON:  05/26/2015 FINDINGS: Lower Chest: No acute findings. Hepatobiliary: No hepatic masses identified. Gallbladder is unremarkable. No evidence of biliary ductal dilatation. Pancreas:  No mass or inflammatory changes. Spleen: Within normal limits in size and appearance. Adrenals/Urinary Tract: Normal adrenal glands. Stable tiny sub-cm cyst in upper pole of right kidney. Mild diffuse left renal parenchymal atrophy and scarring are again seen. New ill-defined area of decreased enhancement is seen in the lower pole the left kidney with associated perinephric soft tissue stranding, suspicious for pyelonephritis. A poorly defined fluid attenuation lesion is seen in the lower pole  of the left kidney which measures 2.5 x 2.4 cm, suspicious for small renal abscess. No evidence of ureteral calculi or dilatation. Unremarkable unopacified urinary bladder. Stomach/Bowel: No evidence of obstruction, inflammatory process or abnormal fluid collections. Normal appendix visualized. Vascular/Lymphatic: No pathologically enlarged lymph nodes. No abdominal aortic aneurysm. Aortic atherosclerotic calcification noted. Reproductive: Prior hysterectomy. Stable small benign-appearing cystic lesion in the left adnexa measuring 2.9 x 1.2 cm, consistent with benign etiology. No other pelvic masses identified. No evidence of ascites. Other:  None. Musculoskeletal:  No  suspicious bone lesions identified. IMPRESSION: Findings suspicious for pyelonephritis and small abscess in lower pole of left kidney. Suggest correlation with urinalysis, and recommend continued follow-up by CT to confirm resolution. No evidence of ureteral calculi or hydronephrosis. Aortic Atherosclerosis (ICD10-I70.0). Electronically Signed   By: Marlaine Hind M.D.   On: 04/14/2020 12:14   Assessment/Plan Principal Problem:   Pyelonephritis of left kidney Active Problems:   Seizures (HCC)   GERD (gastroesophageal reflux disease)   Intellectual disability   Generalized convulsive epilepsy ([place])   Seizure disorder ([place])   Frequent falls   Renal abscess   Leukocytosis   Fever   LLQ abdominal pain   Dysphagia   AKI (acute kidney injury) ([place])   Sepsis secondary to UTI ([place])   1. Sepsis secondary to UTI/pyelonephritis - I worry about gram negative bacteremia given renal abscess.  Unfortunately blood cultures taken after IV antibiotics given.  Treating infection aggressively and IV fluid boluses given in ED.   2. Left pyelonephritis - continue IV ceftriaxone 2 gm every 24 hours.  Continue IV fluids.  Pain management ordered.  3. Left renal abscess - renal US ordered to follow up.  Treating as above.  4. Dysphagia - dys 2 diet  ordered.  5. Epilepsy - resume home antiepileptics.  6. AKI - prerenal - treating with IV fluids.  Follow BMP.  7. Leukocytosis - Follow CBC with differential.  8. Intellectual disability - with some behavioral disturbance - liberalize visitation to allow mother and sister to remain at bedside as much as possible.  9. GERD - protonix ordered for GI protection.  10. Acute agitation -haldol ordered as needed only for severe agitation.  11. Fever - tylenol ordered as needed.  12. Sinus tachycardia - supportive management.   DVT prophylaxis: enoxaparin   Code Status: Full   Family Communication: sister at bedside   Disposition Plan: Home   Consults called: n/a   Admission status: INP    Emi Lymon MD [facility] How to contact the [facility] Attending or Consulting provider [place] or covering provider during after hours [place], for this patient?  1. Check the care team in [facility] and look for a) attending/consulting [facility] provider listed and b) the [facility] team listed 2. Log into [email or web address] and use [place]'s universal password to access. If you do not have the password, please contact the hospital operator. 3. Locate the [facility] provider you are looking for under [facility] and page to a number that you can be directly reached. 4. If you still have difficulty reaching the provider, please page the [facility] (Director on Call) for the Hospitalists listed on amion for assistance.  If 7PM-7AM, please contact night-coverage [email or web address] Password [facility]  04/14/2020, 2:55 PM

## 2020-04-14 NOTE — ED Triage Notes (Signed)
Pt brought to ED via [facility] for left lower quadrant abdominal pain.

## 2020-04-14 NOTE — ED Notes (Signed)
Pt was informed that we need a urine specimen.  

## 2020-04-14 NOTE — Plan of Care (Signed)
  Problem: Education: Goal: Knowledge of General Education information will improve Description: Including pain rating scale, medication(s)/side effects and non-pharmacologic comfort measures Outcome: Not Progressing   Problem: Clinical Measurements: Goal: Ability to maintain clinical measurements within normal limits will improve Outcome: Not Progressing   Problem: Elimination: Goal: Will not experience complications related to bowel motility Outcome: Not Progressing

## 2020-04-14 NOTE — Progress Notes (Signed)
Patient restless, unable to follow commands.  PO medications given by sister with repeated attempts at redirection.

## 2020-04-14 NOTE — ED Notes (Signed)
Pt is restless and anxious.  Redirection not helpful . Family at bedside.

## 2020-04-14 NOTE — ED Provider Notes (Signed)
[facility] EMERGENCY DEPARTMENT Provider Note   CSN: [id number] Arrival date & time: 04/14/20  8676     History Chief Complaint  Patient presents with  . Abdominal Pain    Maria [name] is a 59 y.o. female with past medical history significant for intellectual disability, constipation and seizures.  Accompanied by sister who provides majority of history.  HPI Patient presents to emergency department today via EMS with chief complaint of left lower quadrant pain x3 weeks.  Pain has been intermittent.  Patient unable to describe pain, just states that it hurts.  Sister states patient will cry out in pain and put her hand over her left lower quadrant.  She has had significantly decreased p.o. intake for the last x3 days.  Yesterday only took a sip of water to take her pills. No nausea or emesis. Last bowel movement was yesterday and was small hard pellets, consistent with her usual constipation.  Patient also saw GYN x2 days ago and was found to have UTI.  She was prescribed Bactrim for this. Patient was seen in the ED on 03/28/20 for vaginal bleeding.  She was thought to have BV and was treated with Flagyl.  She took the medication as prescribed and has finished the course. The vaginal bleeding has resolved.  Patient came to the emergency department last night however left before being seen by provider after prolonged wait time in the lobby.  They were hoping to follow-up with GYN today as she has an ultrasound scheduled however patient is so weak and fell at home last night trying to walk to the bathroom.  Mother witnessed the fall.  Patient landed on her left hip.  She has been able to ambulate since the fall.  She did not hit her head or lose consciousness.      Past Medical History:  Diagnosis Date  . Constipation   . Dysphagia   . Fracture    R foot  . GERD (gastroesophageal reflux disease)   . History of shingles 10/2016  . Mental retardation    lesion in head  . Seizures  ([place])    "not fully controlled on max doses of meds" (Neuro ofc note 12/2014)  . Thrombocytopenia ([place])   . Tremor     Patient Active Problem List   Diagnosis Date Noted  . Pain in left ankle and joints of left foot 12/02/2019  . Positive colorectal cancer screening using Cologuard test 10/28/2018  . Posterior tibial tendinitis, right leg 04/23/2016  . Pain in right foot 04/12/2016  . Acute encephalopathy   . Myoclonus   . Frequent falls 11/09/2015  . Fall 11/09/2015  . Chest pain 03/16/2014  . Upper abdominal pain 03/16/2014  . Seizure disorder ([place]) 03/16/2014  . 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 Generalized convulsive epilepsy ([place]) 10/06/2012  . Encounter for therapeutic drug monitoring 10/06/2012  . Mental retardation   . Thrombocytopenia ([place]) 05/23/2011  . Seizures ([place]) 03/21/2011  . GERD (gastroesophageal reflux disease) 03/21/2011  . CLOSED FRACTURE OF ACROMIAL END OF CLAVICLE 07/13/2008    Past Surgical History:  Procedure Laterality Date  . BIOPSY  09/16/2014   Procedure: BIOPSY;  Surgeon: Rogene Houston, MD;  Location: [facility] ORS;  Service: Endoscopy;;  . CATARACT EXTRACTION     both eyes, May of 2015  . COLONOSCOPY    . COLONOSCOPY WITH PROPOFOL N/A 11/20/2018   Procedure: COLONOSCOPY WITH PROPOFOL;  Surgeon: Rogene Houston, MD;  Location: [facility] ENDO  SUITE;  Service: Endoscopy;  Laterality: N/A;  . ESOPHAGEAL DILATION N/A 09/16/2014   Procedure: ESOPHAGEAL DILATION WITH 54FR MALONEY DILATOR;  Surgeon: Rogene Houston, MD;  Location: [facility] ORS;  Service: Endoscopy;  Laterality: N/A;  . ESOPHAGOGASTRODUODENOSCOPY (EGD) WITH PROPOFOL N/A 09/16/2014   Procedure: ESOPHAGOGASTRODUODENOSCOPY (EGD) WITH PROPOFOL;  Surgeon: Rogene Houston, MD;  Location: [facility] ORS;  Service: Endoscopy;  Laterality: N/A;  . MOUTH SURGERY    . POLYPECTOMY  11/20/2018   Procedure: POLYPECTOMY;  Surgeon: Rogene Houston, MD;  Location: [facility] END[facility] SUITE;  Service: Endoscopy;;  colon  . Skin  graft to gum Right 08/2013  . TONSILLECTOMY AND ADENOIDECTOMY    . TOTAL ABDOMINAL HYSTERECTOMY       OB History   No obstetric history on file.     Family History  Problem Relation Age of Onset  . High Cholesterol Mother   . High blood pressure Mother   . Diabetes Father     Social History   Tobacco Use  . Smoking status: Former Smoker    Packs/day: 1.50    Years: 15.00    Pack years: 22.50    Types: Cigarettes    Quit date: 05/22/2006    Years since quitting: 13.9  . Smokeless tobacco: Never Used  Substance Use Topics  . Alcohol use: No    Alcohol/week: 0.0 standard drinks  . Drug use: No    Home Medications Prior to Admission medications   Medication Sig Start Date End Date Taking? Authorizing Provider  atorvastatin (LIPITOR) 20 MG tablet Take 20 mg by mouth daily. 08/24/18   [provider]  Calcium Carbonate-Vitamin D3 (CALCIUM 600-D) 600-400 MG-UNIT TABS Take 1 tablet by mouth 2 (two) times a day.    [provider]  clonazePAM (KLONOPIN) 0.5 MG tablet Take 1 tablet (0.5 mg total) by mouth 2 (two) times daily. 04/13/20   Suzzanne Cloud, NP  divalproex (DEPAKOTE ER) 500 MG 24 hr tablet Take 1 tablet (500 mg total) by mouth 2 (two) times daily. 11/25/19   Suzzanne Cloud, NP  estradiol (VIVELLE-DOT) 0.05 MG/24HR patch Place 1 patch onto the skin 2 (two) times a week. On Wednesday and Saturday    [provider]  fluconazole (DIFLUCAN) 150 MG tablet Take 1 tablet on Day 1 and take additional 1 tablet on Day 4 if irritation continues. 03/28/20   Alroy Bailiff, [place], PA-C  ibuprofen (ADVIL) 200 MG tablet Take 400 mg by mouth every 6 (six) hours as needed for moderate pain.    [provider]  lacosamide (VIMPAT) 200 MG TABS tablet Take 1 tablet (200 mg total) by mouth 2 (two) times daily. 11/25/19   Suzzanne Cloud, NP  levocetirizine (XYZAL) 5 MG tablet Take 5 mg by mouth every evening.     [provider]  metroNIDAZOLE (FLAGYL) 500  MG tablet Take 1 tablet (500 mg total) by mouth 2 (two) times daily. 03/28/20   Eustaquio Maize, PA-C  omeprazole (PRILOSEC) 40 MG capsule Take 1 capsule (40 mg total) by mouth daily. 11/03/19   Minus Liberty, PA-C  oxybutynin (DITROPAN-XL) 5 MG 24 hr tablet Take 5 mg by mouth at bedtime.     [provider]  Pediatric Multiple Vit-C-FA (PEDIATRIC MULTIVITAMIN) chewable tablet Chew 1 tablet by mouth daily.      [provider]    Allergies    Codeine and Lamictal [lamotrigine]  Review of Systems   Review of Systems  Unable to  perform ROS: Other (intellectual disabilty )    Physical Exam Updated Vital Signs BP (!) 119/54 (BP Location: Left Arm)   Pulse 84   Temp 98.2 F (36.8 C) (Oral)   Resp 19   Ht 5\' 6"  (1.676 m)   Wt 67.1 kg   SpO2 100%   BMI 23.88 kg/m   Physical Exam Vitals and nursing note reviewed.  Constitutional:      General: She is not in acute distress.    Appearance: She is not ill-appearing.  HENT:     Head: Normocephalic and atraumatic.     Right Ear: Tympanic membrane and external ear normal.     Left Ear: Tympanic membrane and external ear normal.     Nose: Nose normal.     Mouth/Throat:     Mouth: Mucous membranes are dry.     Pharynx: Oropharynx is clear.  Eyes:     General: No scleral icterus.       Right eye: No discharge.        Left eye: No discharge.     Extraocular Movements: Extraocular movements intact.     Conjunctiva/sclera: Conjunctivae normal.     Pupils: Pupils are equal, round, and reactive to light.  Neck:     Vascular: No JVD.  Cardiovascular:     Rate and Rhythm: Normal rate and regular rhythm.     Pulses: Normal pulses.          Radial pulses are 2+ on the right side and 2+ on the left side.     Heart sounds: Normal heart sounds.  Pulmonary:     Comments: Lungs clear to auscultation in all fields. Symmetric chest rise. No wheezing, rales, or rhonchi. Abdominal:     Comments: Abdomen is soft,  non-distended, tender to palpation of left lower quadrant. No rigidity, no guarding. No peritoneal signs.  Musculoskeletal:        General: Normal range of motion.     Cervical back: Normal range of motion.     Comments: Pelvis is stable.  No tenderness palpation of left hip.  No overlying skin changes, no obvious deformity.  DP pulses 2+ bilaterally.  Skin:    General: Skin is warm and dry.     Capillary Refill: Capillary refill takes less than 2 seconds.  Neurological:     Mental Status: Mental status is at baseline.     GCS: GCS eye subscore is 4. GCS verbal subscore is 5. GCS motor subscore is 6.     Comments: Fluent speech, no facial droop.  Psychiatric:        Behavior: Behavior normal.     ED Results / Procedures / Treatments   Labs (all labs ordered are listed, but only abnormal results are displayed) Labs Reviewed  COMPREHENSIVE METABOLIC PANEL - Abnormal; Notable for the following components:      Result Value   Glucose, Bld 118 (*)    BUN 27 (*)    Creatinine, Ser 1.67 (*)    AST 13 (*)    GFR, Estimated 35 (*)    All other components within normal limits  CBC - Abnormal; Notable for the following components:   WBC 15.0 (*)    Platelets 126 (*)    All other components within normal limits  URINALYSIS, ROUTINE W REFLEX MICROSCOPIC - Abnormal; Notable for the following components:   APPearance HAZY (*)    Hgb urine dipstick SMALL (*)    Ketones, ur 5 (*)  Protein, ur 100 (*)    Leukocytes,Ua LARGE (*)    WBC, UA >50 (*)    Bacteria, UA RARE (*)    Non Squamous Epithelial 0-5 (*)    All other components within normal limits  URINE CULTURE  RESPIRATORY PANEL BY RT PCR (FLU A&B, COVID)  LIPASE, BLOOD  LACTIC ACID, PLASMA    EKG None  Radiology CT Abdomen Pelvis W Contrast  Result Date: 04/14/2020 CLINICAL DATA:  Left lower quadrant pain. EXAM: CT ABDOMEN AND PELVIS WITH CONTRAST TECHNIQUE: Multidetector CT imaging of the abdomen and pelvis was performed  using the standard protocol following bolus administration of intravenous contrast. CONTRAST:  140mL OMNIPAQUE IOHEXOL 300 MG/ML  SOLN COMPARISON:  05/26/2015 FINDINGS: Lower Chest: No acute findings. Hepatobiliary: No hepatic masses identified. Gallbladder is unremarkable. No evidence of biliary ductal dilatation. Pancreas:  No mass or inflammatory changes. Spleen: Within normal limits in size and appearance. Adrenals/Urinary Tract: Normal adrenal glands. Stable tiny sub-cm cyst in upper pole of right kidney. Mild diffuse left renal parenchymal atrophy and scarring are again seen. New ill-defined area of decreased enhancement is seen in the lower pole the left kidney with associated perinephric soft tissue stranding, suspicious for pyelonephritis. A poorly defined fluid attenuation lesion is seen in the lower pole of the left kidney which measures 2.5 x 2.4 cm, suspicious for small renal abscess. No evidence of ureteral calculi or dilatation. Unremarkable unopacified urinary bladder. Stomach/Bowel: No evidence of obstruction, inflammatory process or abnormal fluid collections. Normal appendix visualized. Vascular/Lymphatic: No pathologically enlarged lymph nodes. No abdominal aortic aneurysm. Aortic atherosclerotic calcification noted. Reproductive: Prior hysterectomy. Stable small benign-appearing cystic lesion in the left adnexa measuring 2.9 x 1.2 cm, consistent with benign etiology. No other pelvic masses identified. No evidence of ascites. Other:  None. Musculoskeletal:  No suspicious bone lesions identified. IMPRESSION: Findings suspicious for pyelonephritis and small abscess in lower pole of left kidney. Suggest correlation with urinalysis, and recommend continued follow-up by CT to confirm resolution. No evidence of ureteral calculi or hydronephrosis. Aortic Atherosclerosis (ICD10-I70.0). Electronically Signed   By: Marlaine Hind M.D.   On: 04/14/2020 12:14    Procedures Procedures (including critical  care time)  Medications Ordered in ED Medications  haloperidol lactate (HALDOL) injection 2 mg (has no administration in time range)  sodium chloride 0.9 % bolus 1,000 mL (has no administration in time range)  sodium chloride 0.9 % bolus 1,000 mL (1,000 mLs Intravenous New Bag/Given 04/14/20 1123)  ondansetron (ZOFRAN) injection 4 mg (4 mg Intravenous Given 04/14/20 1121)  morphine 4 MG/ML injection 4 mg (4 mg Intravenous Given 04/14/20 1122)  iohexol (OMNIPAQUE) 300 MG/ML solution 100 mL (100 mLs Intravenous Contrast Given 04/14/20 1136)  cefTRIAXone (ROCEPHIN) 1 g in sodium chloride 0.9 % 100 mL IVPB (1 g Intravenous New Bag/Given 04/14/20 1239)  LORazepam (ATIVAN) injection 0.5 mg (0.5 mg Intravenous Given 04/14/20 1314)  acetaminophen (TYLENOL) tablet 650 mg (650 mg Oral Given 04/14/20 1311)    ED Course  I have reviewed the triage vital signs and the nursing notes.  Pertinent labs & imaging results that were available during my care of the patient were reviewed by me and considered in my medical decision making (see chart for details).  Clinical Course as of Apr 14 1322  Fri Apr 14, 2020  1259 Axillary temp. Patient agitated. Will get rectal temp when calm. Tylenol ordered as well as haldol and ativan for acute agitation.  Temp(!): 100.4 F (38 C) [KW]  Clinical Course User Index [KW] Lewanda Rife   MDM Rules/Calculators/A&P                          History provided by family member with additional history obtained from chart review.    Patient presents to the ED with complaints of abdominal pain. Patient nontoxic appearing, in no apparent distress, vitals WNL. On exam patient tender to LLQ, no peritoneal signs.  She looks dehydrated, mucous membranes are dry.  Will evaluate with labs and imaging. Analgesics, anti-emetics, and fluids administered. I viewed patient's labs.  She has a leukocytosis of 15, no anemia,  thrombocytopenia to 126, consistent with  baseline.  CMP without significant electrolyte derangement, does have elevated creatinine.  Today is 1.67, compared to x 2 weeks ago when it was 0.92. Lipase and lactic acid within normal range.  UA shows infection with large leukocytes and over 50 WBC.  Will send urine culture.Second liter of IVF given.  CT AP viewed by me shows perinephric soft tissue stranding of left kidney.  There also looks to be a poorly defined fluid attenuation lesion is seen in the lower pole of the left kidney measuring 2.5 x 2.4 cm that is suspicious for small renal abscess. Findings and plan of care discussed with supervising physician Dr. Eulis Foster who agrees with plan to admit for pyelonephritis and renal abscess.  Spoke with Dr. Wynetta Emery with hospitalist service who agrees to assume care of patient and bring into the hospital for further evaluation and management.      Portions of this note were generated with Lobbyist. Dictation errors may occur despite best attempts at proofreading.   Final Clinical Impression(s) / ED Diagnoses Final diagnoses:  Pyelonephritis  Renal abscess, left    Rx / DC Orders ED Discharge Orders    None       Lewanda Rife 04/14/20 1340    Daleen Bo, MD 04/16/20 1459

## 2020-04-15 DIAGNOSIS — N179 Acute kidney failure, unspecified: Secondary | ICD-10-CM | POA: Diagnosis not present

## 2020-04-15 DIAGNOSIS — R1310 Dysphagia, unspecified: Secondary | ICD-10-CM | POA: Diagnosis not present

## 2020-04-15 DIAGNOSIS — R296 Repeated falls: Secondary | ICD-10-CM

## 2020-04-15 DIAGNOSIS — R509 Fever, unspecified: Secondary | ICD-10-CM | POA: Diagnosis not present

## 2020-04-15 LAB — CBC WITH DIFFERENTIAL/PLATELET
Abs Immature Granulocytes: 0.07 10*3/uL (ref 0.00–0.07)
Basophils Absolute: 0 10*3/uL (ref 0.0–0.1)
Basophils Relative: 0 %
Eosinophils Absolute: 0 10*3/uL (ref 0.0–0.5)
Eosinophils Relative: 0 %
HCT: 33.3 % — ABNORMAL LOW (ref 36.0–46.0)
Hemoglobin: 10.3 g/dL — ABNORMAL LOW (ref 12.0–15.0)
Immature Granulocytes: 1 %
Lymphocytes Relative: 10 %
Lymphs Abs: 1.1 10*3/uL (ref 0.7–4.0)
MCH: 31.3 pg (ref 26.0–34.0)
MCHC: 30.9 g/dL (ref 30.0–36.0)
MCV: 101.2 fL — ABNORMAL HIGH (ref 80.0–100.0)
Monocytes Absolute: 1.1 10*3/uL — ABNORMAL HIGH (ref 0.1–1.0)
Monocytes Relative: 11 %
Neutro Abs: 8.5 10*3/uL — ABNORMAL HIGH (ref 1.7–7.7)
Neutrophils Relative %: 78 %
Platelets: 115 10*3/uL — ABNORMAL LOW (ref 150–400)
RBC: 3.29 MIL/uL — ABNORMAL LOW (ref 3.87–5.11)
RDW: 13.8 % (ref 11.5–15.5)
WBC: 10.9 10*3/uL — ABNORMAL HIGH (ref 4.0–10.5)
nRBC: 0 % (ref 0.0–0.2)

## 2020-04-15 LAB — BASIC METABOLIC PANEL
Anion gap: 9 (ref 5–15)
BUN: 24 mg/dL — ABNORMAL HIGH (ref 6–20)
CO2: 24 mmol/L (ref 22–32)
Calcium: 8 mg/dL — ABNORMAL LOW (ref 8.9–10.3)
Chloride: 109 mmol/L (ref 98–111)
Creatinine, Ser: 1.18 mg/dL — ABNORMAL HIGH (ref 0.44–1.00)
GFR, Estimated: 54 mL/min — ABNORMAL LOW (ref >60)
Glucose, Bld: 112 mg/dL — ABNORMAL HIGH (ref 70–99)
Potassium: 4.7 mmol/L (ref 3.5–5.1)
Sodium: 142 mmol/L (ref 135–145)

## 2020-04-15 LAB — MAGNESIUM: Magnesium: 1.8 mg/dL (ref 1.7–2.4)

## 2020-04-15 LAB — BLOOD GAS, VENOUS
Acid-base deficit: 6.8 mmol/L — ABNORMAL HIGH (ref 0.0–2.0)
Bicarbonate: 18.3 mmol/L — ABNORMAL LOW (ref 20.0–28.0)
FIO2: 40
O2 Saturation: 71.9 %
Patient temperature: 37.4
pCO2, Ven: 42.8 mmHg — ABNORMAL LOW (ref 44.0–60.0)
pH, Ven: 7.269 (ref 7.250–7.430)
pO2, Ven: 43.4 mmHg (ref 32.0–45.0)

## 2020-04-15 MED ORDER — CLONAZEPAM 0.25 MG PO TBDP
0.5000 mg | ORAL_TABLET | Freq: Two times a day (BID) | ORAL | Status: DC
  Administered 2020-04-15: 0.5 mg via ORAL
  Filled 2020-04-15: qty 2

## 2020-04-15 MED ORDER — SODIUM CHLORIDE 0.9 % IV SOLN
200.0000 mg | Freq: Two times a day (BID) | INTRAVENOUS | Status: DC
  Administered 2020-04-15 – 2020-04-17 (×4): 200 mg via INTRAVENOUS
  Filled 2020-04-15 (×7): qty 20

## 2020-04-15 MED ORDER — HYDROMORPHONE HCL 1 MG/ML IJ SOLN
0.5000 mg | INTRAMUSCULAR | Status: DC | PRN
  Administered 2020-04-15 – 2020-04-18 (×7): 0.5 mg via INTRAVENOUS
  Filled 2020-04-15 (×6): qty 0.5

## 2020-04-15 MED ORDER — HALOPERIDOL LACTATE 5 MG/ML IJ SOLN
2.0000 mg | Freq: Four times a day (QID) | INTRAMUSCULAR | Status: DC | PRN
  Administered 2020-04-18 – 2020-04-20 (×5): 2 mg via INTRAVENOUS
  Filled 2020-04-15 (×8): qty 1

## 2020-04-15 MED ORDER — LORAZEPAM 2 MG/ML IJ SOLN
1.0000 mg | Freq: Once | INTRAMUSCULAR | Status: AC
  Administered 2020-04-15: 1 mg via INTRAVENOUS
  Filled 2020-04-15: qty 1

## 2020-04-15 MED ORDER — VALPROATE SODIUM 500 MG/5ML IV SOLN
500.0000 mg | Freq: Two times a day (BID) | INTRAVENOUS | Status: DC
  Administered 2020-04-15 – 2020-04-17 (×4): 500 mg via INTRAVENOUS
  Filled 2020-04-15 (×7): qty 5

## 2020-04-15 MED ORDER — HYDROMORPHONE HCL 1 MG/ML IJ SOLN
0.5000 mg | Freq: Once | INTRAMUSCULAR | Status: AC
  Administered 2020-04-15: 0.5 mg via INTRAVENOUS
  Filled 2020-04-15: qty 0.5

## 2020-04-15 NOTE — Progress Notes (Signed)
When I arrived on shift, pt was restless and could be heard yelling at nurses station.  Sister called and stated that pt had to use the bathroom. Patient assisted to [facility] by Wynetta Emery, CNA with my assistance.  Patient unsteady on her feet, required lifting to get from bed to [facility], and raising her hands reaching/grabbing for objects some of which are not there.  Patient was unable to void and was assisted back to bed.  Sister called with medication questions.  After getting all the medications needed for the patient I entered the room.  Went over medications with the sister advising that many had been switched to IV and that I was currently missing one that I needed to call to get.  Upon heading back to the room, I heard the sister on the phone with someone upset that the patient was yelling and "they aren't providing care; listen to her".  I wanted to determine sister's expectations for care so that I could meet them, while also ensuring patient safety, which appear to be conflicting at times.  For instance, patient is unsteady but sister wants to get up her to [facility] to urinate despite her being a fall risk, unable to follow commands d/t confusion, and having fallen at home before coming in. Sister said she had gotten up 3 times without falling. I explained it only took once and reiterated that she had fallen at home before coming in. Sister raised her voice on multiple occassions and I asked her not to raise her voice at me because I had not raised my voice at her. I told her we had gotten along great last night and that I wanted to find a way to go back to that, but in the event that we could not, I had security outside of the room to escort her out.      Cherlyn Roberts and Kristi, Agricultural consultant, entered the room to deescalate the situation.  Sister upset because I placed a gown on the patient when I entered the room to give her medications as patient was unclothed in case security needed to become involved in the situation.   This upset the sister because patient kept pulling at it.  I explained that I wanted to respect the patient's dignity and privacy in the event that security needed to become involved in the situation. I then explained to Barnett Applebaum and the sister that I was going to step out of the room so that resolution could be met.   Sister requested that her daytime doctor come to see her.  Dr. Orlin Hilding present at bedside to assess patient.  Verbal order for VBG and EKG received and read back.    Report given to [facility], [place]

## 2020-04-15 NOTE — Progress Notes (Signed)
Called by RN to assess for suction and Rhonci. Patient was very combative. I first used the yankauer to try to retrieve secretions from the back of the patients throat to no avail. I proceded to NT suction the patient. I was not able to get her to cough at all with either of these procedures. Patient was answering simple questions. She was still febrile at this time. Patients spo2 was 79/80 on RA and pt was placed on 5lpm [place] SPO2 increased to 90%.

## 2020-04-15 NOTE — Progress Notes (Addendum)
At [place] I paged Dr. Wynetta Emery because patient would not stay in the bed and was confused and pulling off oxygen and IVs. Dr. Wynetta Emery order soft wrist restraints which were put on at 1715. Patient's mom left and sister arrive to spend the night. She expressed to Janett Billow the nurse tech that she was concerned because her sister was wet. She took the patient's diaper off and it was reported to me that she the diaper put it in [place] face saying that this was a wet diaper and her sister was not going to sit in a wet diaper. She asked to speak with the nurse, me. I went in the room at 1830 and the sister was angry because the restraints were on and her sister was wet. I removed the restraints and addressed the issue about her sister being wet. She took pictures of the patient's wrists. Sister requested a BCS which I brought to the room. The patient used the BCS and I changed her sheets. Patient's other sister came to the nurses' station and demanded to see the patient. Security and [employer] police were on the floor dealing with another patient. They were able to talk with the sister and explain the visiting policy. Sister still demanded to see the patient so we allowed her to look through the window. Both sisters exchanged heated words and the sister was escorted out of the building by security.

## 2020-04-15 NOTE — Progress Notes (Signed)
Nurse assumed care of pt around 2030 after pt sister requested a new nurse.  Pt was very agitated and restless trying to get up from bed and try to grab at equipment.  Charge nurse Kristi already in room.  MD on call contacted by Steffanie Dunn to come to room to talk with family member at their request.  MD saw pt and discussed with sister about the current medication pt is on and ordered EKG, VBG, and some medications to help pt relax.  Extra dose of dilaudid given and one time dose of ativan given.  Pt calmed down and went to sleep at 2150 shortly after meds were given.  Bed alarm on, family member in room.  Will continue to monitor.

## 2020-04-15 NOTE — Progress Notes (Addendum)
PROGRESS NOTE   Maria [name]  EAV:[id number] DOB: 02-04-1961 DOA: 04/14/2020 PCP: Celene Squibb, MD   Chief Complaint  Patient presents with  . Abdominal Pain    Brief Admission History:  59 y.o. female with medical history significant for chronic constipation, epilepsy, intellectual disability, GERD, dysphagia, tremors who has been dealing with problems with left lower quadrant abdominal pain for several weeks that started out as intermittent and has progressively worsened.  She was seen in the ED recently and diagnosed with a vaginal infection.  She completed a course of metronidazole.  She continued to complain of left lower quadrant abdominal pain and discomfort.  She has had poor appetite and poor oral intake over the last 3 days.  She is having hard bowel movements.  She also has been having fever and her mentation has changed from baseline.  Her family says that she rarely complains of pain but has recently been pointing to the left lower quadrant with grimacing and discomfort.  She was admitted with left pyelonephritis with small left renal abscess.   Assessment & Plan:   Principal Problem:   Pyelonephritis of left kidney Active Problems:   Seizures (HCC)   GERD (gastroesophageal reflux disease)   Intellectual disability   Generalized convulsive epilepsy ([place])   Seizure disorder (HCC)   Frequent falls   Renal abscess   Leukocytosis   Fever   LLQ abdominal pain   Dysphagia   AKI (acute kidney injury) ([place])   Sepsis secondary to UTI ([place])  1. Sepsis secondary to UTI/pyelonephritis and renal abscess - I worry about gram negative bacteremia given concurrent renal abscess.  Unfortunately blood cultures taken after IV antibiotics given.  BCs no growth to date.  Treating infection aggressively and IV fluid boluses given in ED.  Sepsis physiology resolving with aggressive measures.  2. Left pyelonephritis - continue IV ceftriaxone 2 gm every 24 hours.  Continue IV fluids.  Pain  management ordered.  3. Left renal abscess - renal US consistent with small left renal abscess.  Treating as above.  4. Dysphagia - dys 2 diet ordered.  5. Epilepsy - resume home antiepileptics but change to IV as patient not swallowing well.  6. AKI - prerenal - treating with IV fluids.  Follow BMP.  Improving.  7. Leukocytosis - Follow CBC with differential.  WBC trending down.  8. Intellectual disability - with some behavioral disturbance - liberalize visitation to allow mother and sister to remain at bedside as much as possible.  9. GERD - protonix ordered for GI protection.  10. Acute agitation -haldol ordered as needed only for severe agitation.  11. Fever - tylenol ordered as needed.  12. Sinus tachycardia - improving with supportive measures.   DVT prophylaxis: enoxaparin   Code Status: Full   Family Communication: mother at bedside 11/13  Disposition Plan: Home   Consults called: n/a   Admission status: INP   Status is: Inpatient  Remains inpatient appropriate because:IV treatments appropriate due to intensity of illness or inability to take PO and Inpatient level of care appropriate due to severity of illness  Dispo: The patient is from: Home              Anticipated d/c is to: Home              Anticipated d/c date is: 3 days              Patient currently is not medically stable to d/c.  Consultants:  N/a   Procedures:   n/a  Antimicrobials:  Ceftriaxone 11/12>>   Subjective: Pt became agitated overnight and was medicated and now sedated.    Objective: Vitals:   04/14/20 2228 04/14/20 2330 04/15/20 0554 04/15/20 0557  BP: (!) 122/55  117/66   Pulse: (!) 108 99 98   Resp: 15     Temp: (!) 100.9 F (38.3 C) 99.1 F (37.3 C) (!) 101.3 F (38.5 C)   TempSrc: Axillary Axillary Oral   SpO2: 91%  (!) 87% 94%  Weight:      Height:        Intake/Output Summary (Last 24 hours) at 04/15/2020 1248 Last data filed at 04/15/2020 0326 Gross per 24 hour   Intake 5294.57 ml  Output --  Net 5294.57 ml   Filed Weights   04/14/20 0901  Weight: 67.1 kg    Examination:  General exam: Appears calm and comfortable. Pt is sedated.   Respiratory system: Clear to auscultation. Respiratory effort normal. Cardiovascular system: S1 & S2 heard, RRR. No JVD, murmurs, rubs, gallops or clicks. No pedal edema. Gastrointestinal system: Abdomen is nondistended, soft. No organomegaly or masses felt. Normal bowel sounds heard. Central nervous system: sedated. No focal neurological deficits. Extremities: Symmetric 5 x 5 power. Skin: No rashes, lesions or ulcers Psychiatry: sedated.   Data Reviewed: I have personally reviewed following labs and imaging studies  CBC: Recent Labs  Lab 04/14/20 0939 04/15/20 0858  WBC 15.0* 10.9*  NEUTROABS  --  8.5*  HGB 12.7 10.3*  HCT 39.2 33.3*  MCV 97.5 101.2*  PLT 126* 115*    Basic Metabolic Panel: Recent Labs  Lab 04/14/20 0939 04/15/20 0858  NA 137 142  K 3.6 4.7  CL 99 109  CO2 26 24  GLUCOSE 118* 112*  BUN 27* 24*  CREATININE 1.67* 1.18*  CALCIUM 8.9 8.0*  MG  --  1.8    GFR: Estimated Creatinine Clearance: 48.6 mL/min (A) (by C-G formula based on SCr of 1.18 mg/dL (H)).  Liver Function Tests: Recent Labs  Lab 04/14/20 0939  AST 13*  ALT 18  ALKPHOS 74  BILITOT 0.6  PROT 7.5  ALBUMIN 3.8    CBG: No results for input(s): GLUCAP in the last 168 hours.  Recent Results (from the past 240 hour(s))  Culture, blood (routine x 2)     Status: None (Preliminary result)   Collection Time: 04/14/20  1:58 PM   Specimen: BLOOD  Result Value Ref Range Status   Specimen Description BLOOD LEFT ARM  Final   Special Requests   Final    BOTTLES DRAWN AEROBIC AND ANAEROBIC Blood Culture adequate volume   Culture   Final    NO GROWTH < 24 HOURS Performed at [name], [address] [place], [place] [address]    Report Status PENDING  Incomplete  Culture, blood (routine x 2)     Status:  None (Preliminary result)   Collection Time: 04/14/20  2:08 PM   Specimen: BLOOD  Result Value Ref Range Status   Specimen Description BLOOD LEFT WRIST  Final   Special Requests   Final    BOTTLES DRAWN AEROBIC AND ANAEROBIC Blood Culture adequate volume   Culture   Final    NO GROWTH < 24 HOURS Performed at [facility], [address], [place], [place] [address]    Report Status PENDING  Incomplete  Respiratory Panel by RT PCR (Flu A&B, Covid) - Nasopharyngeal Swab     Status:  None   Collection Time: 04/14/20  2:12 PM   Specimen: Nasopharyngeal Swab  Result Value Ref Range Status   SARS Coronavirus 2 by RT PCR NEGATIVE NEGATIVE Final    Comment: (NOTE) SARS-CoV-2 target nucleic acids are NOT DETECTED.  The SARS-CoV-2 RNA is generally detectable in upper respiratoy specimens during the acute phase of infection. The lowest concentration of SARS-CoV-2 viral copies this assay can detect is 131 copies/mL. A negative result does not preclude SARS-Cov-2 infection and should not be used as the sole basis for treatment or other patient management decisions. A negative result may occur with  improper specimen collection/handling, submission of specimen other than nasopharyngeal swab, presence of viral mutation(s) within the areas targeted by this assay, and inadequate number of viral copies (<131 copies/mL). A negative result must be combined with clinical observations, patient history, and epidemiological information. The expected result is Negative.  Fact Sheet for Patients:  PinkCheek.be  Fact Sheet for Healthcare Providers:  GravelBags.it  This test is no t yet approved or cleared by the [place] FDA and  has been authorized for detection and/or diagnosis of SARS-CoV-2 by FDA under an Emergency Use Authorization (EUA). This EUA will remain  in effect (meaning this test can be used) for the duration of the COVID-19  declaration under Section 564(b)(1) of the Act, 21 U.S.C. section 360bbb-3(b)(1), unless the authorization is terminated or revoked sooner.     Influenza A by PCR NEGATIVE NEGATIVE Final   Influenza B by PCR NEGATIVE NEGATIVE Final    Comment: (NOTE) The Xpert Xpress SARS-CoV-2/FLU/RSV assay is intended as an aid in  the diagnosis of influenza from Nasopharyngeal swab specimens and  should not be used as a sole basis for treatment. Nasal washings and  aspirates are unacceptable for Xpert Xpress SARS-CoV-2/FLU/RSV  testing.  Fact Sheet for Patients: PinkCheek.be  Fact Sheet for Healthcare Providers: GravelBags.it  This test is not yet approved or cleared by the [place] FDA and  has been authorized for detection and/or diagnosis of SARS-CoV-2 by  FDA under an Emergency Use Authorization (EUA). This EUA will remain  in effect (meaning this test can be used) for the duration of the  Covid-19 declaration under Section 564(b)(1) of the Act, 21  U.S.C. section 360bbb-3(b)(1), unless the authorization is  terminated or revoked. Performed at [facility], [address], [place], [place] [address]      Radiology Studies: CT Abdomen Pelvis W Contrast  Result Date: 04/14/2020 CLINICAL DATA:  Left lower quadrant pain. EXAM: CT ABDOMEN AND PELVIS WITH CONTRAST TECHNIQUE: Multidetector CT imaging of the abdomen and pelvis was performed using the standard protocol following bolus administration of intravenous contrast. CONTRAST:  131mL OMNIPAQUE IOHEXOL 300 MG/ML  SOLN COMPARISON:  05/26/2015 FINDINGS: Lower Chest: No acute findings. Hepatobiliary: No hepatic masses identified. Gallbladder is unremarkable. No evidence of biliary ductal dilatation. Pancreas:  No mass or inflammatory changes. Spleen: Within normal limits in size and appearance. Adrenals/Urinary Tract: Normal adrenal glands. Stable tiny sub-cm cyst in upper pole of right  kidney. Mild diffuse left renal parenchymal atrophy and scarring are again seen. New ill-defined area of decreased enhancement is seen in the lower pole the left kidney with associated perinephric soft tissue stranding, suspicious for pyelonephritis. A poorly defined fluid attenuation lesion is seen in the lower pole of the left kidney which measures 2.5 x 2.4 cm, suspicious for small renal abscess. No evidence of ureteral calculi or dilatation. Unremarkable unopacified urinary bladder. Stomach/Bowel: No evidence of  obstruction, inflammatory process or abnormal fluid collections. Normal appendix visualized. Vascular/Lymphatic: No pathologically enlarged lymph nodes. No abdominal aortic aneurysm. Aortic atherosclerotic calcification noted. Reproductive: Prior hysterectomy. Stable small benign-appearing cystic lesion in the left adnexa measuring 2.9 x 1.2 cm, consistent with benign etiology. No other pelvic masses identified. No evidence of ascites. Other:  None. Musculoskeletal:  No suspicious bone lesions identified. IMPRESSION: Findings suspicious for pyelonephritis and small abscess in lower pole of left kidney. Suggest correlation with urinalysis, and recommend continued follow-up by CT to confirm resolution. No evidence of ureteral calculi or hydronephrosis. Aortic Atherosclerosis (ICD10-I70.0). Electronically Signed   By: Marlaine Hind M.D.   On: 04/14/2020 12:14   US RENAL  Result Date: 04/14/2020 CLINICAL DATA:  Left lower quadrant pain. Recent urinary tract infection. Findings concerning for left-sided pyelonephritis and lower pole renal abscess on CT. EXAM: RENAL / URINARY TRACT ULTRASOUND COMPLETE COMPARISON:  CT abdomen and pelvis 04/14/2020 FINDINGS: Examination was limited by the patient's mental status and bowel gas. Right Kidney: Renal measurements: 11.2 x 4.7 x 6.4 cm = volume: 175 mL. Echogenicity within normal limits. No mass or hydronephrosis visualized. Left Kidney: Renal measurements: 11.0 x  5.0 x 6.2 cm = volume: 177 mL. No hydronephrosis. 2.5 x 2.2 cm hypoechoic to anechoic nonvascular structure along the lower pole corresponding to the abnormality on today's earlier CT. Bladder: Appears normal for degree of bladder distention. Other: None. IMPRESSION: 1. 2.5 cm structure along the lower pole of the left kidney suspicious for a small abscess given the ultrasound and CT appearance. 2. Unremarkable appearance of the right kidney. Electronically Signed   By: Logan Bores M.D.   On: 04/14/2020 16:23   Scheduled Meds: . acetaminophen  650 mg Oral Q6H   Or  . acetaminophen  650 mg Rectal Q6H  . atorvastatin  20 mg Oral QPM  . clonazepam  0.5 mg Oral BID  . enoxaparin (LOVENOX) injection  40 mg Subcutaneous Q24H  . loratadine  5 mg Oral QPM  . multivitamin with minerals  1 tablet Oral Daily  . oxybutynin  5 mg Oral QHS  . pantoprazole  40 mg Oral Daily   Continuous Infusions: . sodium chloride 65 mL/hr at 04/15/20 0326  . cefTRIAXone (ROCEPHIN)  IV    . lacosamide (VIMPAT) IV 200 mg (04/15/20 1135)  . valproate sodium       LOS: 1 day   Time spent: 35 mins   Dalan Cowger Wynetta Emery, MD How to contact the [facility] Attending or Consulting provider [place] or covering provider during after hours [place], for this patient?  1. Check the care team in [facility] and look for a) attending/consulting [facility] provider listed and b) the [facility] team listed 2. Log into [email or web address] and use [place]'s universal password to access. If you do not have the password, please contact the hospital operator. 3. Locate the [facility] provider you are looking for under [facility] and page to a number that you can be directly reached. 4. If you still have difficulty reaching the provider, please page the [facility] (Director on Call) for the Hospitalists listed on amion for assistance.  04/15/2020, 12:48 PM

## 2020-04-15 NOTE — Progress Notes (Signed)
   04/15/20 1608  Assess: MEWS Score  BP 132/61  Pulse Rate (!) 111  SpO2 (!) 87 %  Assess: MEWS Score  MEWS Temp 1  MEWS Systolic 0  MEWS Pulse 2  MEWS RR 0  MEWS LOC 1  MEWS Score 4  MEWS Score Color Red  Assess: if the MEWS score is Yellow or Red  Were vital signs taken at a resting state? No  Focused Assessment No change from prior assessment  MEWS guidelines implemented *See Row Information* No, vital signs rechecked

## 2020-04-16 ENCOUNTER — Inpatient Hospital Stay (HOSPITAL_COMMUNITY): Payer: Medicare Other

## 2020-04-16 ENCOUNTER — Inpatient Hospital Stay: Payer: Self-pay

## 2020-04-16 DIAGNOSIS — R509 Fever, unspecified: Secondary | ICD-10-CM | POA: Diagnosis not present

## 2020-04-16 DIAGNOSIS — K219 Gastro-esophageal reflux disease without esophagitis: Secondary | ICD-10-CM | POA: Diagnosis not present

## 2020-04-16 DIAGNOSIS — N151 Renal and perinephric abscess: Secondary | ICD-10-CM

## 2020-04-16 DIAGNOSIS — N12 Tubulo-interstitial nephritis, not specified as acute or chronic: Secondary | ICD-10-CM

## 2020-04-16 DIAGNOSIS — R1310 Dysphagia, unspecified: Secondary | ICD-10-CM | POA: Diagnosis not present

## 2020-04-16 DIAGNOSIS — N179 Acute kidney failure, unspecified: Secondary | ICD-10-CM | POA: Diagnosis not present

## 2020-04-16 LAB — RESPIRATORY PANEL BY RT PCR (FLU A&B, COVID)
Influenza A by PCR: NEGATIVE
Influenza B by PCR: NEGATIVE
SARS Coronavirus 2 by RT PCR: NEGATIVE

## 2020-04-16 LAB — CBC WITH DIFFERENTIAL/PLATELET
Abs Immature Granulocytes: 0.1 10*3/uL — ABNORMAL HIGH (ref 0.00–0.07)
Basophils Absolute: 0 10*3/uL (ref 0.0–0.1)
Basophils Relative: 0 %
Eosinophils Absolute: 0.1 10*3/uL (ref 0.0–0.5)
Eosinophils Relative: 1 %
HCT: 34.3 % — ABNORMAL LOW (ref 36.0–46.0)
Hemoglobin: 10.4 g/dL — ABNORMAL LOW (ref 12.0–15.0)
Immature Granulocytes: 1 %
Lymphocytes Relative: 11 %
Lymphs Abs: 1.1 10*3/uL (ref 0.7–4.0)
MCH: 31.7 pg (ref 26.0–34.0)
MCHC: 30.3 g/dL (ref 30.0–36.0)
MCV: 104.6 fL — ABNORMAL HIGH (ref 80.0–100.0)
Monocytes Absolute: 1.2 10*3/uL — ABNORMAL HIGH (ref 0.1–1.0)
Monocytes Relative: 11 %
Neutro Abs: 8.3 10*3/uL — ABNORMAL HIGH (ref 1.7–7.7)
Neutrophils Relative %: 76 %
Platelets: 138 10*3/uL — ABNORMAL LOW (ref 150–400)
RBC: 3.28 MIL/uL — ABNORMAL LOW (ref 3.87–5.11)
RDW: 14.3 % (ref 11.5–15.5)
WBC: 10.8 10*3/uL — ABNORMAL HIGH (ref 4.0–10.5)
nRBC: 0.2 % (ref 0.0–0.2)

## 2020-04-16 LAB — BASIC METABOLIC PANEL
Anion gap: 13 (ref 5–15)
BUN: 33 mg/dL — ABNORMAL HIGH (ref 6–20)
CO2: 22 mmol/L (ref 22–32)
Calcium: 8.7 mg/dL — ABNORMAL LOW (ref 8.9–10.3)
Chloride: 114 mmol/L — ABNORMAL HIGH (ref 98–111)
Creatinine, Ser: 1.06 mg/dL — ABNORMAL HIGH (ref 0.44–1.00)
GFR, Estimated: >60 mL/min (ref >60)
Glucose, Bld: 115 mg/dL — ABNORMAL HIGH (ref 70–99)
Potassium: 4.7 mmol/L (ref 3.5–5.1)
Sodium: 149 mmol/L — ABNORMAL HIGH (ref 135–145)

## 2020-04-16 LAB — GLUCOSE, CAPILLARY: Glucose-Capillary: 209 mg/dL — ABNORMAL HIGH (ref 70–99)

## 2020-04-16 LAB — MAGNESIUM: Magnesium: 2 mg/dL (ref 1.7–2.4)

## 2020-04-16 LAB — MRSA PCR SCREENING: MRSA by PCR: NEGATIVE

## 2020-04-16 MED ORDER — CHLORHEXIDINE GLUCONATE 0.12 % MT SOLN
15.0000 mL | Freq: Two times a day (BID) | OROMUCOSAL | Status: DC
  Administered 2020-04-16 – 2020-05-05 (×37): 15 mL via OROMUCOSAL
  Filled 2020-04-16 (×27): qty 15

## 2020-04-16 MED ORDER — PIPERACILLIN-TAZOBACTAM 3.375 G IVPB
3.3750 g | Freq: Three times a day (TID) | INTRAVENOUS | Status: DC
  Administered 2020-04-16 – 2020-04-17 (×3): 3.375 g via INTRAVENOUS
  Filled 2020-04-16 (×4): qty 50

## 2020-04-16 MED ORDER — DEXMEDETOMIDINE HCL IN NACL 200 MCG/50ML IV SOLN
0.4000 ug/kg/h | INTRAVENOUS | Status: DC
  Filled 2020-04-16: qty 50

## 2020-04-16 MED ORDER — LORAZEPAM 2 MG/ML IJ SOLN
1.0000 mg | Freq: Two times a day (BID) | INTRAMUSCULAR | Status: DC
  Administered 2020-04-16: 1 mg via INTRAVENOUS
  Filled 2020-04-16 (×2): qty 1

## 2020-04-16 MED ORDER — SODIUM CHLORIDE 0.9 % IV BOLUS
1000.0000 mL | Freq: Once | INTRAVENOUS | Status: AC
  Administered 2020-04-16: 1000 mL via INTRAVENOUS

## 2020-04-16 MED ORDER — DEXTROSE 5 % IV SOLN: INTRAVENOUS | Status: DC

## 2020-04-16 MED ORDER — NOREPINEPHRINE 4 MG/250ML-% IV SOLN
0.0000 ug/min | INTRAVENOUS | Status: DC
  Administered 2020-04-16: 2 ug/min via INTRAVENOUS
  Filled 2020-04-16: qty 250

## 2020-04-16 MED ORDER — IPRATROPIUM-ALBUTEROL 0.5-2.5 (3) MG/3ML IN SOLN
3.0000 mL | RESPIRATORY_TRACT | Status: DC
  Administered 2020-04-16 – 2020-04-17 (×7): 3 mL via RESPIRATORY_TRACT
  Filled 2020-04-16 (×7): qty 3

## 2020-04-16 MED ORDER — SODIUM CHLORIDE 0.9 % IV SOLN
500.0000 mg | INTRAVENOUS | Status: DC
  Administered 2020-04-16: 500 mg via INTRAVENOUS
  Filled 2020-04-16 (×2): qty 500

## 2020-04-16 MED ORDER — ORAL CARE MOUTH RINSE
15.0000 mL | Freq: Two times a day (BID) | OROMUCOSAL | Status: DC
  Administered 2020-04-16 – 2020-05-05 (×38): 15 mL via OROMUCOSAL

## 2020-04-16 MED ORDER — INSULIN ASPART 100 UNIT/ML ~~LOC~~ SOLN
0.0000 [IU] | SUBCUTANEOUS | Status: DC
  Administered 2020-04-17: 1 [IU] via SUBCUTANEOUS
  Administered 2020-04-17 (×3): 2 [IU] via SUBCUTANEOUS
  Administered 2020-04-17: 1 [IU] via SUBCUTANEOUS
  Administered 2020-04-18: 2 [IU] via SUBCUTANEOUS
  Administered 2020-04-18 – 2020-04-19 (×7): 1 [IU] via SUBCUTANEOUS
  Administered 2020-04-20: 2 [IU] via SUBCUTANEOUS
  Administered 2020-04-20 – 2020-04-21 (×6): 1 [IU] via SUBCUTANEOUS
  Administered 2020-04-22: 2 [IU] via SUBCUTANEOUS
  Administered 2020-04-22 – 2020-04-23 (×5): 1 [IU] via SUBCUTANEOUS
  Administered 2020-04-24: 2 [IU] via SUBCUTANEOUS
  Administered 2020-04-24 – 2020-04-25 (×4): 1 [IU] via SUBCUTANEOUS
  Administered 2020-04-25: 2 [IU] via SUBCUTANEOUS
  Administered 2020-04-25: 3 [IU] via SUBCUTANEOUS
  Administered 2020-04-26: 1 [IU] via SUBCUTANEOUS
  Administered 2020-04-26: 2 [IU] via SUBCUTANEOUS
  Administered 2020-04-26 (×2): 1 [IU] via SUBCUTANEOUS
  Administered 2020-04-26 – 2020-04-27 (×6): 2 [IU] via SUBCUTANEOUS
  Administered 2020-04-27: 1 [IU] via SUBCUTANEOUS
  Administered 2020-04-27 – 2020-04-28 (×3): 2 [IU] via SUBCUTANEOUS
  Administered 2020-04-28: 3 [IU] via SUBCUTANEOUS
  Administered 2020-04-28 – 2020-04-30 (×11): 2 [IU] via SUBCUTANEOUS
  Administered 2020-04-30 (×2): 1 [IU] via SUBCUTANEOUS
  Administered 2020-05-01 (×2): 2 [IU] via SUBCUTANEOUS
  Administered 2020-05-01: 1 [IU] via SUBCUTANEOUS
  Administered 2020-05-01: 2 [IU] via SUBCUTANEOUS
  Administered 2020-05-01: 3 [IU] via SUBCUTANEOUS
  Administered 2020-05-01 – 2020-05-02 (×2): 2 [IU] via SUBCUTANEOUS
  Administered 2020-05-02 – 2020-05-05 (×6): 1 [IU] via SUBCUTANEOUS

## 2020-04-16 MED ORDER — ENOXAPARIN SODIUM 40 MG/0.4ML ~~LOC~~ SOLN
40.0000 mg | SUBCUTANEOUS | Status: DC
  Administered 2020-04-16 – 2020-05-04 (×19): 40 mg via SUBCUTANEOUS
  Filled 2020-04-16 (×19): qty 0.4

## 2020-04-16 MED ORDER — DEXMEDETOMIDINE HCL IN NACL 400 MCG/100ML IV SOLN
0.4000 ug/kg/h | INTRAVENOUS | Status: DC
  Administered 2020-04-16: 1.2 ug/kg/h via INTRAVENOUS
  Administered 2020-04-16: 0.4 ug/kg/h via INTRAVENOUS
  Administered 2020-04-17: 1.2 ug/kg/h via INTRAVENOUS
  Administered 2020-04-17: 0.9 ug/kg/h via INTRAVENOUS
  Administered 2020-04-17: 1.2 ug/kg/h via INTRAVENOUS
  Administered 2020-04-17: 0.8 ug/kg/h via INTRAVENOUS
  Administered 2020-04-18 (×2): 1.2 ug/kg/h via INTRAVENOUS
  Administered 2020-04-18: 1 ug/kg/h via INTRAVENOUS
  Administered 2020-04-18: 1.2 ug/kg/h via INTRAVENOUS
  Administered 2020-04-19 (×2): 2 ug/kg/h via INTRAVENOUS
  Administered 2020-04-19: 1.2 ug/kg/h via INTRAVENOUS
  Administered 2020-04-19: 2 ug/kg/h via INTRAVENOUS
  Administered 2020-04-19: 1.2 ug/kg/h via INTRAVENOUS
  Administered 2020-04-20: 2 ug/kg/h via INTRAVENOUS
  Administered 2020-04-20: 2.003 ug/kg/h via INTRAVENOUS
  Administered 2020-04-20: 1.5 ug/kg/h via INTRAVENOUS
  Administered 2020-04-20: 2 ug/kg/h via INTRAVENOUS
  Administered 2020-04-20: 1.192 ug/kg/h via INTRAVENOUS
  Administered 2020-04-20: 1.3 ug/kg/h via INTRAVENOUS
  Administered 2020-04-20 – 2020-04-21 (×4): 2 ug/kg/h via INTRAVENOUS
  Administered 2020-04-21: 0.6 ug/kg/h via INTRAVENOUS
  Administered 2020-04-22: 1.4 ug/kg/h via INTRAVENOUS
  Administered 2020-04-22: 0.7 ug/kg/h via INTRAVENOUS
  Administered 2020-04-22: 1.4 ug/kg/h via INTRAVENOUS
  Administered 2020-04-23: 0.9 ug/kg/h via INTRAVENOUS
  Administered 2020-04-23: 0.6 ug/kg/h via INTRAVENOUS
  Administered 2020-04-24: 1.5 ug/kg/h via INTRAVENOUS
  Administered 2020-04-24: 1 ug/kg/h via INTRAVENOUS
  Administered 2020-04-24: 0.8 ug/kg/h via INTRAVENOUS
  Administered 2020-04-24 – 2020-04-25 (×2): 0.7 ug/kg/h via INTRAVENOUS
  Administered 2020-04-25: 0.8 ug/kg/h via INTRAVENOUS
  Administered 2020-04-26: 0.6 ug/kg/h via INTRAVENOUS
  Filled 2020-04-16: qty 100
  Filled 2020-04-16: qty 200
  Filled 2020-04-16 (×3): qty 100
  Filled 2020-04-16: qty 200
  Filled 2020-04-16 (×2): qty 100
  Filled 2020-04-16: qty 200
  Filled 2020-04-16: qty 100
  Filled 2020-04-16 (×2): qty 200
  Filled 2020-04-16 (×7): qty 100
  Filled 2020-04-16: qty 200
  Filled 2020-04-16: qty 100
  Filled 2020-04-16: qty 200
  Filled 2020-04-16 (×3): qty 100
  Filled 2020-04-16: qty 200
  Filled 2020-04-16: qty 100
  Filled 2020-04-16: qty 200
  Filled 2020-04-16 (×7): qty 100

## 2020-04-16 MED ORDER — DEXTROSE-NACL 5-0.9 % IV SOLN: INTRAVENOUS | Status: DC

## 2020-04-16 MED ORDER — LORAZEPAM 2 MG/ML IJ SOLN
1.0000 mg | Freq: Once | INTRAMUSCULAR | Status: AC
  Administered 2020-04-16: 1 mg via INTRAVENOUS
  Filled 2020-04-16: qty 1

## 2020-04-16 MED ORDER — CHLORHEXIDINE GLUCONATE CLOTH 2 % EX PADS
6.0000 | MEDICATED_PAD | Freq: Every day | CUTANEOUS | Status: DC
  Administered 2020-04-16 – 2020-05-05 (×20): 6 via TOPICAL

## 2020-04-16 NOTE — Progress Notes (Signed)
SBP remained in the 80's with map 50. 1 L bolus administered per md order, precedex on hold at this time.  No improvement noted to BP, with SBP noted in the 70's. Dr. Wynetta Emery made aware, new orders received for levophed.

## 2020-04-16 NOTE — Progress Notes (Signed)
SBP noted to be in the 80's with map of 53. Dr. Wynetta Emery made aware. New order received for 1 L bolus: given as ordered. Ordered precedex titrated down.

## 2020-04-16 NOTE — Progress Notes (Signed)
Vascular access contacted about PICC placement. Representative to call back with an ETA.

## 2020-04-16 NOTE — Progress Notes (Signed)
Vascular access consult canceled as Maria [name] IV team is going to put in the PICC whether it be if the patient is here or at [facility].

## 2020-04-16 NOTE — H&P (Addendum)
NAME:  Maria [name], MRN:  [id number], DOB:  November 12, 1960, LOS: 2 ADMISSION DATE:  04/14/2020, CONSULTATION DATE:  04/16/20 REFERRING MD:  Forestine Na, Dr. Wynetta Emery, CHIEF COMPLAINT:  sepsis   Brief History   59 year old woman with hx of epilepsy, intellectual disability, GERD, here with sepsis from pyelonephritis.   History of present illness   59 y.o.femalewith medical history significantforchronic constipation, epilepsy, intellectual disability, GERD, dysphagia, tremors who has been dealing with problems with left lower quadrant abdominal pain for several weeks that started out as intermittent and has progressively worsened.   Constipation Poor appetite x 3 days PTA.  [facility] to [facility] on 11/12. Fevers L pyelonephritis, l renal abscess  Past Medical History   Intellectual disability Epilepsy Chronic constipation Dysphagia  Significant Hospital Events     Consults:    Procedures:    Significant Diagnostic Tests:  cxr 11/14 IMPRESSION: Airspace opacity at multiple sites, most severe in the right mid lung. Appearance felt to be indicative of multifocal pneumonia. This finding may warrant correlation with COVID-19 status.  Borderline cardiac enlargement.  No adenopathy.  CT abdomen:  Findings suspicious for pyelonephritis and small abscess in lower pole of left kidney. Suggest correlation with urinalysis, and recommend continued follow-up by CT to confirm resolution.  No evidence of ureteral calculi or hydronephrosis.   Micro Data:  Urine cutlure:  Ecoli  Blood culture: Negative  Antimicrobials:  Ceftriaxone 2g 11/12 --> Azithro 11/14 -- Zosyn 11/14 -->   Interim history/subjective:  Arrived at [facility] 11/14 in the evening.   Objective   Blood pressure 125/75, pulse (!) 59, temperature (!) 92 F (33.3 C), temperature source Axillary, resp. rate 13, height 5\' 6"  (1.676 m), weight 67.1 kg, SpO2 99 %.    FiO2 (%):  [100 %] 100 %   Intake/Output  Summary (Last 24 hours) at 04/16/2020 2155 Last data filed at 04/16/2020 2100 Gross per 24 hour  Intake 2870.58 ml  Output --  Net 2870.58 ml   Filed Weights   04/14/20 0901  Weight: 67.1 kg    Examination: General: sleepy, comfortable, no respiratory distress HENT: NCAT, PERRL Lungs: CTAB Cardiovascular: RRR no mgr Abdomen: NT, ND, NBS Extremities: normal LE, no edema no erhythema Neuro: sleepy, only minimally arousable   Resolved Hospital Problem list     Assessment & Plan:  AMS, sepsis, pyelonephritis - ecoli.   Also broadened for apparent possible CAP.  Worsened BP, on very low dose levophed, though WBC improving, no fevers. .  Very small cyst on CT, not typically the size that would warrant drainage.  Continue antibiotics, consider broadening to cover ESBL if worsening hemodynamics.    Agitation: was started on precedex.  Currently sedated, will decrease dosage.    Seizure history: continue vimpat, valproate  B infiltrates, hypoxemia:  Echo ordered.  Caution with fluids.   AKI: improved. BUN mildly elevated 33.     Best practice:  Diet: NPO Pain/Anxiety/Delirium protocol (if indicated): precedex 1.2, will decrease dose now  VAP protocol (if indicated):  DVT prophylaxis: lovenox 40mg  daily GI prophylaxis:  Glucose control: ISS Mobility: Code Status: Full  Family Communication: Discussed with sister at bedside Disposition: ICU  Labs   CBC: Recent Labs  Lab 04/14/20 0939 04/15/20 0858 04/16/20 0731  WBC 15.0* 10.9* 10.8*  NEUTROABS  --  8.5* 8.3*  HGB 12.7 10.3* 10.4*  HCT 39.2 33.3* 34.3*  MCV 97.5 101.2* 104.6*  PLT 126* 115* 138*    Basic Metabolic Panel:  Recent Labs  Lab 04/14/20 0939 04/15/20 0858 04/16/20 0731  NA 137 142 149*  K 3.6 4.7 4.7  CL 99 109 114*  CO2 26 24 22   GLUCOSE 118* 112* 115*  BUN 27* 24* 33*  CREATININE 1.67* 1.18* 1.06*  CALCIUM 8.9 8.0* 8.7*  MG  --  1.8 2.0   GFR: Estimated Creatinine Clearance: 54.2  mL/min (A) (by C-G formula based on SCr of 1.06 mg/dL (H)). Recent Labs  Lab 04/14/20 0939 04/15/20 0858 04/16/20 0731  WBC 15.0* 10.9* 10.8*  LATICACIDVEN 1.3  --   --     Liver Function Tests: Recent Labs  Lab 04/14/20 0939  AST 13*  ALT 18  ALKPHOS 74  BILITOT 0.6  PROT 7.5  ALBUMIN 3.8   Recent Labs  Lab 04/14/20 0939  LIPASE 16   No results for input(s): AMMONIA in the last 168 hours.  ABG    Component Value Date/Time   HCO3 18.3 (L) 04/15/2020 2106   TCO2 27 06/30/2008 1535   ACIDBASEDEF 6.8 (H) 04/15/2020 2106   O2SAT 71.9 04/15/2020 2106     Coagulation Profile: No results for input(s): INR, PROTIME in the last 168 hours.  Cardiac Enzymes: No results for input(s): CKTOTAL, CKMB, CKMBINDEX, TROPONINI in the last 168 hours.  HbA1C: No results found for: HGBA1C  CBG: Recent Labs  Lab 04/16/20 2025  GLUCAP 209*    Review of Systems:   Unable to assess  Past Medical History  She,  has a past medical history of Constipation, Dysphagia, Fracture, GERD (gastroesophageal reflux disease), History of shingles (10/2016), Mental retardation, Seizures ([place]), Thrombocytopenia ([place]), and Tremor.   Surgical History    Past Surgical History:  Procedure Laterality Date  . BIOPSY  09/16/2014   Procedure: BIOPSY;  Surgeon: Rogene Houston, MD;  Location: [facility] ORS;  Service: Endoscopy;;  . CATARACT EXTRACTION     both eyes, May of 2015  . COLONOSCOPY    . COLONOSCOPY WITH PROPOFOL N/A 11/20/2018   Procedure: COLONOSCOPY WITH PROPOFOL;  Surgeon: Rogene Houston, MD;  Location: [facility] ENDO SUITE;  Service: Endoscopy;  Laterality: N/A;  . ESOPHAGEAL DILATION N/A 09/16/2014   Procedure: ESOPHAGEAL DILATION WITH 54FR MALONEY DILATOR;  Surgeon: Rogene Houston, MD;  Location: [facility] ORS;  Service: Endoscopy;  Laterality: N/A;  . ESOPHAGOGASTRODUODENOSCOPY (EGD) WITH PROPOFOL N/A 09/16/2014   Procedure: ESOPHAGOGASTRODUODENOSCOPY (EGD) WITH PROPOFOL;  Surgeon: Rogene Houston,  MD;  Location: [facility] ORS;  Service: Endoscopy;  Laterality: N/A;  . MOUTH SURGERY    . POLYPECTOMY  11/20/2018   Procedure: POLYPECTOMY;  Surgeon: Rogene Houston, MD;  Location: [facility] ENDO SUITE;  Service: Endoscopy;;  colon  . Skin graft to gum Right 08/2013  . TONSILLECTOMY AND ADENOIDECTOMY    . TOTAL ABDOMINAL HYSTERECTOMY       Social History   reports that she quit smoking about 13 years ago. Her smoking use included cigarettes. She has a 22.50 pack-year smoking history. She has never used smokeless tobacco. She reports that she does not drink alcohol and does not use drugs.   Family History   Her family history includes Diabetes in her father; High Cholesterol in her mother; High blood pressure in her mother.   Allergies Allergies  Allergen Reactions  . Codeine Nausea And Vomiting  . Lamictal [Lamotrigine] Rash     Home Medications  Prior to Admission medications   Medication Sig Start Date End Date Taking? Authorizing Provider  atorvastatin (LIPITOR) 20 MG  tablet Take 20 mg by mouth daily. 08/24/18  Yes [provider]  clonazePAM (KLONOPIN) 0.5 MG tablet Take 1 tablet (0.5 mg total) by mouth 2 (two) times daily. 04/13/20  Yes Suzzanne Cloud, NP  divalproex (DEPAKOTE ER) 500 MG 24 hr tablet Take 1 tablet (500 mg total) by mouth 2 (two) times daily. 11/25/19  Yes Suzzanne Cloud, NP  ibuprofen (ADVIL) 200 MG tablet Take 400 mg by mouth every 6 (six) hours as needed for moderate pain.   Yes [provider]  lacosamide (VIMPAT) 200 MG TABS tablet Take 1 tablet (200 mg total) by mouth 2 (two) times daily. 11/25/19  Yes Suzzanne Cloud, NP  levocetirizine (XYZAL) 5 MG tablet Take 5 mg by mouth every evening.    Yes [provider]  omeprazole (PRILOSEC) 40 MG capsule Take 1 capsule (40 mg total) by mouth daily. 11/03/19  Yes Laurine Blazer A, PA-C  oxybutynin (DITROPAN-XL) 5 MG 24 hr tablet Take 5 mg by mouth at bedtime.    Yes [provider]  Pediatric  Multiple Vit-C-FA (PEDIATRIC MULTIVITAMIN) chewable tablet Chew 1 tablet by mouth daily.     Yes [provider]     Critical care time: 35 minutes      Collier Bullock, MD

## 2020-04-16 NOTE — Progress Notes (Signed)
Report called and given to Shanon Brow, RN on 46M- ICU at [facility]. Family member at bedside and aware of patient transfer.

## 2020-04-16 NOTE — Progress Notes (Signed)
Report given to [facility]. Crew is en route and should be here in about 20-30 mins.

## 2020-04-16 NOTE — Progress Notes (Signed)
eLink Physician-Brief Progress Note Patient Name: Maria [name] DOB: 10-08-1960 MRN: [id number]   Date of Service  04/16/2020  HPI/Events of Note  Hyperglycemia - Blood glucose = 209.   eICU Interventions  Plan: 1. Q 4 hour sensitive Novolog SSI.     Intervention Category Major Interventions: Hyperglycemia - active titration of insulin therapy  Lysle Dingwall 04/16/2020, 9:48 PM

## 2020-04-16 NOTE — Progress Notes (Signed)
PROGRESS NOTE   Maria [name]  UDJ:[id number] DOB: 1961-03-06 DOA: 04/14/2020 PCP: Celene Squibb, MD   Chief Complaint  Patient presents with  . Abdominal Pain   Brief Admission History:  59 y.o. female with medical history significant for chronic constipation, epilepsy, intellectual disability, GERD, dysphagia, tremors who has been dealing with problems with left lower quadrant abdominal pain for several weeks that started out as intermittent and has progressively worsened.  She was seen in the ED recently and diagnosed with a vaginal infection.  She completed a course of metronidazole.  She continued to complain of left lower quadrant abdominal pain and discomfort.  She has had poor appetite and poor oral intake over the last 3 days.  She is having hard bowel movements.  She also has been having fever and her mentation has changed from baseline.  Her family says that she rarely complains of pain but has recently been pointing to the left lower quadrant with grimacing and discomfort.  She was admitted with left pyelonephritis with small left renal abscess.   Assessment & Plan:   Principal Problem:   Pyelonephritis of left kidney Active Problems:   Seizures (HCC)   GERD (gastroesophageal reflux disease)   Intellectual disability   Generalized convulsive epilepsy ([place])   Seizure disorder (HCC)   Frequent falls   Renal abscess   Leukocytosis   Fever   LLQ abdominal pain   Dysphagia   AKI (acute kidney injury) ([place])   Sepsis secondary to UTI ([place])  1. Sepsis secondary to E coli UTI/pyelonephritis and renal abscess - Follow sensitivity results. WBC trending down.   Unfortunately blood cultures taken after IV antibiotics given.  BCs no growth to date.  Treating infection aggressively with ceftriaxone 2gm IV daily and IV fluids.  Sepsis physiology resolving with aggressive measures.  2. Left pyelonephritis - continue IV ceftriaxone 2 gm every 24 hours for E coli infection.  Continue IV  fluids.  Pain management ordered.  3. Left renal abscess - renal US consistent with small left renal abscess.  Treating as above.  4. Aspiration pneumonia - added azithromycin, aspiration precautions, check portable CXR.  5. Dysphagia - NPO for now due to current mental status and requesting SLP evaluation.   6. Epilepsy - resume home antiepileptics but changed to IV while NPO.  7. AKI - prerenal - treating with IV fluids.  Follow BMP.  Improving.  8. Leukocytosis - Follow CBC with differential.  WBC trending down.  9. Intellectual disability - with some behavioral disturbance - liberalize visitation to allow mother and sister to remain at bedside as much as possible.  10. GERD - protonix ordered for GI protection.  11. Acute severe agitation and delirium - Pt having severe agitation, transferring to stepdown ICU, start precedex for sedation, follow closely.    12. Fever - tylenol ordered as needed.  13. Sinus tachycardia - improving with supportive measures.   DVT prophylaxis: enoxaparin   Code Status: Full   Family Communication: sister hcpoa at bedside 11/14  Disposition Plan: Home   Consults called: n/a   Admission status: INP   Status is: Inpatient  Remains inpatient appropriate because:IV treatments appropriate due to intensity of illness or inability to take PO and Inpatient level of care appropriate due to severity of illness  Dispo: The patient is from: Home              Anticipated d/c is to: Home  Anticipated d/c date is: 3 days              Patient currently is not medically stable to d/c.  Consultants:   PCCM  Procedures:   n/a  Antimicrobials:  Ceftriaxone 11/12>>  Azithromycin 11/13>>  Subjective: Pt has been increasingly agitated, unable to be still, pulling out IV lines.  Pt having increasing cough and chest congestion and likely has aspirated secretions.     Objective: Vitals:   04/15/20 1608 04/15/20 1613 04/16/20 0206 04/16/20 0220  BP:  132/61 135/67 124/78   Pulse: (!) 111 (!) 108 (!) 101   Resp:  18 (!) 24 18  Temp:  99.3 F (37.4 C) 98.3 F (36.8 C)   TempSrc:  Oral Oral   SpO2: (!) 87% 92% 93%   Weight:      Height:       No intake or output data in the 24 hours ending 04/16/20 1048 Filed Weights   04/14/20 0901  Weight: 67.1 kg    Examination:  General exam: Pt agitated pulling at lines, yelling and kicking in bed.   Respiratory system: coarse anterior upper airway congestion Cardiovascular system: S1 & S2 heard, tachycardic rate,  No JVD, murmurs, rubs, gallops or clicks. No pedal edema. Gastrointestinal system: Abdomen is nondistended, soft. No organomegaly or masses felt. Normal bowel sounds heard. Central nervous system: moving all extremities. No focal neurological deficits. Extremities: Symmetric 5 x 5 power. Skin: No rashes, lesions or ulcers Psychiatry: extreme agitation.    Data Reviewed: I have personally reviewed following labs and imaging studies  CBC: Recent Labs  Lab 04/14/20 0939 04/15/20 0858 04/16/20 0731  WBC 15.0* 10.9* 10.8*  NEUTROABS  --  8.5* 8.3*  HGB 12.7 10.3* 10.4*  HCT 39.2 33.3* 34.3*  MCV 97.5 101.2* 104.6*  PLT 126* 115* 138*    Basic Metabolic Panel: Recent Labs  Lab 04/14/20 0939 04/15/20 0858 04/16/20 0731  NA 137 142 149*  K 3.6 4.7 4.7  CL 99 109 114*  CO2 26 24 22   GLUCOSE 118* 112* 115*  BUN 27* 24* 33*  CREATININE 1.67* 1.18* 1.06*  CALCIUM 8.9 8.0* 8.7*  MG  --  1.8 2.0    GFR: Estimated Creatinine Clearance: 54.2 mL/min (A) (by C-G formula based on SCr of 1.06 mg/dL (H)).  Liver Function Tests: Recent Labs  Lab 04/14/20 0939  AST 13*  ALT 18  ALKPHOS 74  BILITOT 0.6  PROT 7.5  ALBUMIN 3.8    CBG: No results for input(s): GLUCAP in the last 168 hours.  Recent Results (from the past 240 hour(s))  Urine culture     Status: Abnormal (Preliminary result)   Collection Time: 04/14/20 11:20 AM   Specimen: Urine, Clean Catch   Result Value Ref Range Status   Specimen Description   Final    URINE, CLEAN CATCH Performed at [facility], [address] [place], [place] [address]    Special Requests   Final    NONE Performed at [facility], [address] [place], [place] [address]    Culture (A)  Final    50,000 COLONIES/mL ESCHERICHIA COLI SUSCEPTIBILITIES TO FOLLOW Performed at [facility], [place] [address], [place], [place] [address]    Report Status PENDING  Incomplete  Culture, blood (routine x 2)     Status: None (Preliminary result)   Collection Time: 04/14/20  1:58 PM   Specimen: BLOOD  Result Value Ref Range Status   Specimen  Description BLOOD LEFT ARM  Final   Special Requests   Final    BOTTLES DRAWN AEROBIC AND ANAEROBIC Blood Culture adequate volume   Culture   Final    NO GROWTH < 24 HOURS Performed at [facility], [address] [place], [place] [address]    Report Status PENDING  Incomplete  Culture, blood (routine x 2)     Status: None (Preliminary result)   Collection Time: 04/14/20  2:08 PM   Specimen: BLOOD  Result Value Ref Range Status   Specimen Description BLOOD LEFT WRIST  Final   Special Requests   Final    BOTTLES DRAWN AEROBIC AND ANAEROBIC Blood Culture adequate volume   Culture   Final    NO GROWTH < 24 HOURS Performed at [facility], [address], [place], [place] [address]    Report Status PENDING  Incomplete  Respiratory Panel by RT PCR (Flu A&B, Covid) - Nasopharyngeal Swab     Status: None   Collection Time: 04/14/20  2:12 PM   Specimen: Nasopharyngeal Swab  Result Value Ref Range Status   SARS Coronavirus 2 by RT PCR NEGATIVE NEGATIVE Final    Comment: (NOTE) SARS-CoV-2 target nucleic acids are NOT DETECTED.  The SARS-CoV-2 RNA is generally detectable in upper respiratoy specimens during the acute phase of infection. The lowest concentration of SARS-CoV-2 viral copies this assay can detect is 131 copies/mL. A negative result does not preclude  SARS-Cov-2 infection and should not be used as the sole basis for treatment or other patient management decisions. A negative result may occur with  improper specimen collection/handling, submission of specimen other than nasopharyngeal swab, presence of viral mutation(s) within the areas targeted by this assay, and inadequate number of viral copies (<131 copies/mL). A negative result must be combined with clinical observations, patient history, and epidemiological information. The expected result is Negative.  Fact Sheet for Patients:  PinkCheek.be  Fact Sheet for Healthcare Providers:  GravelBags.it  This test is no t yet approved or cleared by the [place] FDA and  has been authorized for detection and/or diagnosis of SARS-CoV-2 by FDA under an Emergency Use Authorization (EUA). This EUA will remain  in effect (meaning this test can be used) for the duration of the COVID-19 declaration under Section 564(b)(1) of the Act, 21 U.S.C. section 360bbb-3(b)(1), unless the authorization is terminated or revoked sooner.     Influenza A by PCR NEGATIVE NEGATIVE Final   Influenza B by PCR NEGATIVE NEGATIVE Final    Comment: (NOTE) The Xpert Xpress SARS-CoV-2/FLU/RSV assay is intended as an aid in  the diagnosis of influenza from Nasopharyngeal swab specimens and  should not be used as a sole basis for treatment. Nasal washings and  aspirates are unacceptable for Xpert Xpress SARS-CoV-2/FLU/RSV  testing.  Fact Sheet for Patients: PinkCheek.be  Fact Sheet for Healthcare Providers: GravelBags.it  This test is not yet approved or cleared by the [place] FDA and  has been authorized for detection and/or diagnosis of SARS-CoV-2 by  FDA under an Emergency Use Authorization (EUA). This EUA will remain  in effect (meaning this test can be used) for the duration of the   Covid-19 declaration under Section 564(b)(1) of the Act, 21  U.S.C. section 360bbb-3(b)(1), unless the authorization is  terminated or revoked. Performed at [facility], [address] [place], [place] [address]      Radiology Studies: CT Abdomen Pelvis W Contrast  Result Date: 04/14/2020 CLINICAL DATA:  Left lower quadrant  pain. EXAM: CT ABDOMEN AND PELVIS WITH CONTRAST TECHNIQUE: Multidetector CT imaging of the abdomen and pelvis was performed using the standard protocol following bolus administration of intravenous contrast. CONTRAST:  131mL OMNIPAQUE IOHEXOL 300 MG/ML  SOLN COMPARISON:  05/26/2015 FINDINGS: Lower Chest: No acute findings. Hepatobiliary: No hepatic masses identified. Gallbladder is unremarkable. No evidence of biliary ductal dilatation. Pancreas:  No mass or inflammatory changes. Spleen: Within normal limits in size and appearance. Adrenals/Urinary Tract: Normal adrenal glands. Stable tiny sub-cm cyst in upper pole of right kidney. Mild diffuse left renal parenchymal atrophy and scarring are again seen. New ill-defined area of decreased enhancement is seen in the lower pole the left kidney with associated perinephric soft tissue stranding, suspicious for pyelonephritis. A poorly defined fluid attenuation lesion is seen in the lower pole of the left kidney which measures 2.5 x 2.4 cm, suspicious for small renal abscess. No evidence of ureteral calculi or dilatation. Unremarkable unopacified urinary bladder. Stomach/Bowel: No evidence of obstruction, inflammatory process or abnormal fluid collections. Normal appendix visualized. Vascular/Lymphatic: No pathologically enlarged lymph nodes. No abdominal aortic aneurysm. Aortic atherosclerotic calcification noted. Reproductive: Prior hysterectomy. Stable small benign-appearing cystic lesion in the left adnexa measuring 2.9 x 1.2 cm, consistent with benign etiology. No other pelvic masses identified. No evidence of ascites. Other:  None.  Musculoskeletal:  No suspicious bone lesions identified. IMPRESSION: Findings suspicious for pyelonephritis and small abscess in lower pole of left kidney. Suggest correlation with urinalysis, and recommend continued follow-up by CT to confirm resolution. No evidence of ureteral calculi or hydronephrosis. Aortic Atherosclerosis (ICD10-I70.0). Electronically Signed   By: Marlaine Hind M.D.   On: 04/14/2020 12:14   US RENAL  Result Date: 04/14/2020 CLINICAL DATA:  Left lower quadrant pain. Recent urinary tract infection. Findings concerning for left-sided pyelonephritis and lower pole renal abscess on CT. EXAM: RENAL / URINARY TRACT ULTRASOUND COMPLETE COMPARISON:  CT abdomen and pelvis 04/14/2020 FINDINGS: Examination was limited by the patient's mental status and bowel gas. Right Kidney: Renal measurements: 11.2 x 4.7 x 6.4 cm = volume: 175 mL. Echogenicity within normal limits. No mass or hydronephrosis visualized. Left Kidney: Renal measurements: 11.0 x 5.0 x 6.2 cm = volume: 177 mL. No hydronephrosis. 2.5 x 2.2 cm hypoechoic to anechoic nonvascular structure along the lower pole corresponding to the abnormality on today's earlier CT. Bladder: Appears normal for degree of bladder distention. Other: None. IMPRESSION: 1. 2.5 cm structure along the lower pole of the left kidney suspicious for a small abscess given the ultrasound and CT appearance. 2. Unremarkable appearance of the right kidney. Electronically Signed   By: Logan Bores M.D.   On: 04/14/2020 16:23   Scheduled Meds: . acetaminophen  650 mg Oral Q6H   Or  . acetaminophen  650 mg Rectal Q6H  . atorvastatin  20 mg Oral QPM  . chlorhexidine  15 mL Mouth Rinse BID  . Chlorhexidine Gluconate Cloth  6 each Topical Daily  . enoxaparin (LOVENOX) injection  40 mg Subcutaneous Q24H  . ipratropium-albuterol  3 mL Nebulization Q4H  . LORazepam  1 mg Intravenous BID  . mouth rinse  15 mL Mouth Rinse q12n4p  . multivitamin with minerals  1 tablet Oral  Daily  . oxybutynin  5 mg Oral QHS  . pantoprazole  40 mg Oral Daily   Continuous Infusions: . azithromycin 500 mg (04/16/20 0910)  . cefTRIAXone (ROCEPHIN)  IV 2 g (04/15/20 1350)  . dexmedetomidine (PRECEDEX) IV infusion 0.7 mcg/kg/hr (04/16/20 1001)  .  dextrose    . lacosamide (VIMPAT) IV 200 mg (04/15/20 2118)  . valproate sodium 500 mg (04/15/20 2155)     LOS: 2 days   Critical Care Procedure Note Authorized and Performed by: Murvin Natal MD  Total Critical Care time:  45 mins Due to a high probability of clinically significant, life threatening deterioration, the patient required my highest level of preparedness to intervene emergently and I personally spent this critical care time directly and personally managing the patient.  This critical care time included obtaining a history; examining the patient, pulse oximetry; ordering and review of studies; arranging urgent treatment with development of a management plan; evaluation of patient's response of treatment; frequent reassessment; and discussions with other providers.  This critical care time was performed to assess and manage the high probability of imminent and life threatening deterioration that could result in multi-organ failure.  It was exclusive of separately billable procedures and treating other patients and teaching time.    Irwin Brakeman, MD How to contact the [facility] Attending or Consulting provider [place] or covering provider during after hours [place], for this patient?  1. Check the care team in [facility] and look for a) attending/consulting TRH provider listed and b) the [facility] team listed 2. Log into [email or web address] and use [place]'s universal password to access. If you do not have the password, please contact the hospital operator. 3. Locate the [facility] provider you are looking for under [facility] and page to a number that you can be directly reached. 4. If you still have difficulty reaching the provider, please page  the [facility] (Director on Call) for the Hospitalists listed on amion for assistance.  04/16/2020, 10:48 AM

## 2020-04-16 NOTE — Progress Notes (Signed)
04/16/2020 3:49 PM  Pt now with persistent hypotension requiring norepinephrine infusion.  I spoke with Dr. Valeta Harms with [facility] critical care service who agreed to accept patient in transfer at [facility].  ICU bed available and [facility] coming to pick patient up.  Family notified at bedside of pending transfer.   Murvin Natal MD  How to contact the [facility] Attending or Consulting provider [place] or covering provider during after hours [place], for this patient?  1. Check the care team in [facility] and look for a) attending/consulting TRH provider listed and b) the [facility] team listed 2. Log into [email or web address] and use [place]'s universal password to access. If you do not have the password, please contact the hospital operator. 3. Locate the [facility] provider you are looking for under [facility] and page to a number that you can be directly reached. 4. If you still have difficulty reaching the provider, please page the [facility] (Director on Call) for the Hospitalists listed on amion for assistance.

## 2020-04-17 ENCOUNTER — Inpatient Hospital Stay (HOSPITAL_COMMUNITY): Payer: Medicare Other

## 2020-04-17 DIAGNOSIS — I340 Nonrheumatic mitral (valve) insufficiency: Secondary | ICD-10-CM

## 2020-04-17 DIAGNOSIS — F79 Unspecified intellectual disabilities: Secondary | ICD-10-CM | POA: Diagnosis not present

## 2020-04-17 DIAGNOSIS — Z515 Encounter for palliative care: Secondary | ICD-10-CM

## 2020-04-17 DIAGNOSIS — R1310 Dysphagia, unspecified: Secondary | ICD-10-CM | POA: Diagnosis not present

## 2020-04-17 DIAGNOSIS — N12 Tubulo-interstitial nephritis, not specified as acute or chronic: Secondary | ICD-10-CM | POA: Diagnosis not present

## 2020-04-17 DIAGNOSIS — Z7189 Other specified counseling: Secondary | ICD-10-CM

## 2020-04-17 DIAGNOSIS — J9600 Acute respiratory failure, unspecified whether with hypoxia or hypercapnia: Secondary | ICD-10-CM

## 2020-04-17 LAB — BASIC METABOLIC PANEL
Anion gap: 8 (ref 5–15)
BUN: 28 mg/dL — ABNORMAL HIGH (ref 6–20)
CO2: 24 mmol/L (ref 22–32)
Calcium: 7.7 mg/dL — ABNORMAL LOW (ref 8.9–10.3)
Chloride: 114 mmol/L — ABNORMAL HIGH (ref 98–111)
Creatinine, Ser: 0.88 mg/dL (ref 0.44–1.00)
GFR, Estimated: >60 mL/min (ref >60)
Glucose, Bld: 131 mg/dL — ABNORMAL HIGH (ref 70–99)
Potassium: 4 mmol/L (ref 3.5–5.1)
Sodium: 146 mmol/L — ABNORMAL HIGH (ref 135–145)

## 2020-04-17 LAB — CBC
HCT: 26.6 % — ABNORMAL LOW (ref 36.0–46.0)
Hemoglobin: 8.3 g/dL — ABNORMAL LOW (ref 12.0–15.0)
MCH: 31.6 pg (ref 26.0–34.0)
MCHC: 31.2 g/dL (ref 30.0–36.0)
MCV: 101.1 fL — ABNORMAL HIGH (ref 80.0–100.0)
Platelets: 121 10*3/uL — ABNORMAL LOW (ref 150–400)
RBC: 2.63 MIL/uL — ABNORMAL LOW (ref 3.87–5.11)
RDW: 14.5 % (ref 11.5–15.5)
WBC: 6.1 10*3/uL (ref 4.0–10.5)
nRBC: 0 % (ref 0.0–0.2)

## 2020-04-17 LAB — HEMOGLOBIN A1C
Hgb A1c MFr Bld: 5.7 % — ABNORMAL HIGH (ref 4.8–5.6)
Mean Plasma Glucose: 116.89 mg/dL

## 2020-04-17 LAB — GLUCOSE, CAPILLARY
Glucose-Capillary: 146 mg/dL — ABNORMAL HIGH (ref 70–99)
Glucose-Capillary: 156 mg/dL — ABNORMAL HIGH (ref 70–99)
Glucose-Capillary: 165 mg/dL — ABNORMAL HIGH (ref 70–99)
Glucose-Capillary: 190 mg/dL — ABNORMAL HIGH (ref 70–99)
Glucose-Capillary: 82 mg/dL (ref 70–99)
Glucose-Capillary: 99 mg/dL (ref 70–99)

## 2020-04-17 LAB — ECHOCARDIOGRAM COMPLETE
Area-P 1/2: 5.02 cm2
Height: 66 in
S' Lateral: 2.4 cm
Weight: 2366.86 oz

## 2020-04-17 LAB — PROCALCITONIN: Procalcitonin: 0.47 ng/mL

## 2020-04-17 LAB — URINE CULTURE: Culture: 50000 — AB

## 2020-04-17 LAB — MAGNESIUM: Magnesium: 2.2 mg/dL (ref 1.7–2.4)

## 2020-04-17 LAB — PHOSPHORUS: Phosphorus: 2.3 mg/dL — ABNORMAL LOW (ref 2.5–4.6)

## 2020-04-17 MED ORDER — VALPROIC ACID 250 MG/5ML PO SOLN
500.0000 mg | Freq: Two times a day (BID) | ORAL | Status: DC
  Administered 2020-04-17 – 2020-04-21 (×9): 500 mg
  Filled 2020-04-17 (×9): qty 10

## 2020-04-17 MED ORDER — PANTOPRAZOLE SODIUM 40 MG PO PACK
40.0000 mg | PACK | Freq: Every day | ORAL | Status: DC
  Administered 2020-04-17 – 2020-04-20 (×4): 40 mg
  Filled 2020-04-17 (×5): qty 20

## 2020-04-17 MED ORDER — PROSOURCE TF PO LIQD
45.0000 mL | Freq: Two times a day (BID) | ORAL | Status: DC
  Administered 2020-04-17 – 2020-04-25 (×14): 45 mL
  Filled 2020-04-17 (×14): qty 45

## 2020-04-17 MED ORDER — ATORVASTATIN CALCIUM 10 MG PO TABS
20.0000 mg | ORAL_TABLET | Freq: Every evening | ORAL | Status: DC
  Administered 2020-04-17 – 2020-05-02 (×14): 20 mg
  Filled 2020-04-17 (×14): qty 2

## 2020-04-17 MED ORDER — OSMOLITE 1.5 CAL PO LIQD
1000.0000 mL | ORAL | Status: DC
  Administered 2020-04-17 – 2020-04-29 (×7): 1000 mL
  Filled 2020-04-17 (×21): qty 1000

## 2020-04-17 MED ORDER — ADULT MULTIVITAMIN W/MINERALS CH
1.0000 | ORAL_TABLET | Freq: Every day | ORAL | Status: DC
  Administered 2020-04-17 – 2020-05-02 (×14): 1
  Filled 2020-04-17 (×14): qty 1

## 2020-04-17 MED ORDER — IPRATROPIUM-ALBUTEROL 0.5-2.5 (3) MG/3ML IN SOLN
3.0000 mL | RESPIRATORY_TRACT | Status: DC | PRN
  Administered 2020-04-24: 3 mL via RESPIRATORY_TRACT
  Filled 2020-04-17: qty 3

## 2020-04-17 MED ORDER — SODIUM CHLORIDE 0.9 % IV SOLN
2.0000 g | INTRAVENOUS | Status: AC
  Administered 2020-04-17 – 2020-04-27 (×11): 2 g via INTRAVENOUS
  Filled 2020-04-17 (×12): qty 20

## 2020-04-17 MED ORDER — CLONAZEPAM 0.5 MG PO TBDP
0.5000 mg | ORAL_TABLET | Freq: Two times a day (BID) | ORAL | Status: DC
  Administered 2020-04-17 – 2020-04-20 (×7): 0.5 mg
  Filled 2020-04-17 (×7): qty 1

## 2020-04-17 MED ORDER — SODIUM CHLORIDE 0.9 % IV SOLN
INTRAVENOUS | Status: DC | PRN
  Administered 2020-04-17: 250 mL via INTRAVENOUS

## 2020-04-17 MED ORDER — SODIUM CHLORIDE 0.9% FLUSH
10.0000 mL | Freq: Two times a day (BID) | INTRAVENOUS | Status: DC
  Administered 2020-04-18 – 2020-04-30 (×18): 10 mL
  Administered 2020-04-30: 20 mL
  Administered 2020-05-01 – 2020-05-05 (×8): 10 mL

## 2020-04-17 MED ORDER — QUETIAPINE FUMARATE 25 MG PO TABS
25.0000 mg | ORAL_TABLET | Freq: Every day | ORAL | Status: DC
  Administered 2020-04-17: 25 mg
  Filled 2020-04-17: qty 1

## 2020-04-17 MED ORDER — SODIUM CHLORIDE 0.9% FLUSH
10.0000 mL | INTRAVENOUS | Status: DC | PRN
  Administered 2020-04-25 – 2020-04-26 (×2): 10 mL

## 2020-04-17 MED ORDER — LORAZEPAM 2 MG/ML IJ SOLN
2.0000 mg | INTRAMUSCULAR | Status: DC | PRN
  Administered 2020-04-17: 2 mg via INTRAVENOUS
  Filled 2020-04-17: qty 1

## 2020-04-17 MED ORDER — LACOSAMIDE 50 MG PO TABS
200.0000 mg | ORAL_TABLET | Freq: Two times a day (BID) | ORAL | Status: DC
  Administered 2020-04-17 – 2020-04-21 (×9): 200 mg
  Filled 2020-04-17 (×9): qty 4

## 2020-04-17 NOTE — Progress Notes (Signed)
Initial Nutrition Assessment  DOCUMENTATION CODES:   Not applicable  INTERVENTION:   Initiate tube feeds via Cortrak: - Start Osmolite 1.5 @ 20 ml/hr and advance by 10 ml q 4 hours to goal rate of 50 ml/hr (1200 ml/day) - ProSource TF 45 ml BID  Tube feeding regimen at goal provides 1880 kcal, 97 grams of protein, and 914 ml of H2O.   Monitor magnesium, potassium, and phosphorus daily for at least 3 days, MD to replete as needed, as pt is at risk for refeeding syndrome given inadequate PO intake x 1 week.  NUTRITION DIAGNOSIS:   Inadequate oral intake related to lethargy/confusion as evidenced by NPO status.  GOAL:   Patient will meet greater than or equal to 90% of their needs  MONITOR:   Diet advancement, Labs, Weight trends, TF tolerance, I & O's  REASON FOR ASSESSMENT:   Consult Enteral/tube feeding initiation and management  ASSESSMENT:   59 year old female who presented to the ED on 11/12 with abdominal pain. PMH of intellectual disability, chronic constipation, epilepsy, intellectual disability, GERD, dysphagia, tremors. Pt admitted with sepsis secondary to UTI/pyelonephritis and left renal abscess.   11/15 - Cortrak placed, tip gastric  Discussed pt with RN and during ICU rounds. Levophed off this AM. RD consulted for tube feeding initiation and management.  Spoke with pt's sister at bedside. Pt's sister reports that pt typically has a good appetite and eats well. However, pt has not had anything to eat since Tuesday per pt's sister.  Pt's sister reports that she has not noticed pt losing any weight recently. She believes pt typically weighs around 130 lbs but she is not sure. Weight history in chart is limited as it appears the same weight (67.1 kg) has been copied over multiple times from a previous encounter.  Suspect some degree of malnutrition but unable to confirm without NFPE. Pt's sister requested that RD wait to complete NFPE at another time as pt is  resting.  Noted Palliative Care has been consulted.  Medications reviewed and include: SSI q 4 hours, MVI with minerals daily, protonix, IV abx, precedex IVF: D5 @ 100 ml/hr  Labs reviewed: sodium 149, BUN 33, creatinine 1.06 CBG's: 82-209 x 24 hours  I/O's: +8.7 L since admit  NUTRITION - FOCUSED PHYSICAL EXAM:  Not completed. Pt's sister requested RD allow pt to rest.  Diet Order:   Diet Order            Diet NPO time specified Except for: Ice Chips  Diet effective now                 EDUCATION NEEDS:   No education needs have been identified at this time  Skin:  Skin Assessment: Reviewed RN Assessment  Last BM:  no documented BM  Height:   Ht Readings from Last 1 Encounters:  04/14/20 5\' 6"  (1.676 m)    Weight:   Wt Readings from Last 1 Encounters:  04/14/20 67.1 kg    Ideal Body Weight:  59.1 kg  BMI:  Body mass index is 23.88 kg/m.  Estimated Nutritional Needs:   Kcal:  1800-2000  Protein:  85-100 grams  Fluid:  1.8-2.0 L    Gaynell Face, MS, RD, LDN Inpatient Clinical Dietitian Please see AMiON for contact information.

## 2020-04-17 NOTE — Progress Notes (Signed)
NAME:  Maria [name], MRN:  [id number], DOB:  09-Aug-1960, LOS: 3 ADMISSION DATE:  04/14/2020, CONSULTATION DATE:  04/16/20 REFERRING MD:  Forestine Na, Dr. Wynetta Emery, CHIEF COMPLAINT:  sepsis   Brief History   59 year old woman with hx of epilepsy, intellectual disability, GERD, here with sepsis from pyelonephritis.   History of present illness   59 y.o.femalewith medical history significantforchronic constipation, epilepsy, intellectual disability, GERD, dysphagia, tremors who has been dealing with problems with left lower quadrant abdominal pain for several weeks that started out as intermittent and has progressively worsened.   Constipation Poor appetite x 3 days PTA.  Admitted to [facility] on 11/12. Fevers L pyelonephritis, l renal abscess  Past Medical History   Intellectual disability Epilepsy Chronic constipation Dysphagia  Significant Hospital Events     Consults:    Procedures:    Significant Diagnostic Tests:  cxr 11/14 IMPRESSION: Airspace opacity at multiple sites, most severe in the right mid lung. Appearance felt to be indicative of multifocal pneumonia. This finding may warrant correlation with COVID-19 status.  Borderline cardiac enlargement.  No adenopathy.  CT abdomen:  Findings suspicious for pyelonephritis and small abscess in lower pole of left kidney. Suggest correlation with urinalysis, and recommend continued follow-up by CT to confirm resolution.  No evidence of ureteral calculi or hydronephrosis.   Micro Data:  Urine cutlure:  Ecoli  Blood culture: Negative  Antimicrobials:  Ceftriaxone 2g 11/12 --> Azithro 11/14 -- Zosyn 11/14 -->   Interim history/subjective:  No events, resting comfortable, on minimal levophed and precedex.  Objective   Blood pressure (!) 88/56, pulse 77, temperature (!) 100.8 F (38.2 C), temperature source Axillary, resp. rate (!) 22, height 5\' 6"  (1.676 m), weight 67.1 kg, SpO2 99 %.    FiO2 (%):  [100  %] 100 %   Intake/Output Summary (Last 24 hours) at 04/17/2020 0749 Last data filed at 04/17/2020 0700 Gross per 24 hour  Intake 3770.67 ml  Output 150 ml  Net 3620.67 ml   Filed Weights   04/14/20 0901  Weight: 67.1 kg    Examination: Constitutional: somnolent woman lying in bed  Eyes: eyes closed, pupils equal Ears, nose, mouth, and throat: MM dry Cardiovascular: RRR, +SEM, ext warm Respiratory: clear, no wheezing Gastrointestinal: soft, +BS Skin: No rashes, normal turgor Neurologic: withdraws to pain Psychiatric: RASS -3  BMP/CBC pending  Resolved Hospital Problem list     Assessment & Plan:  Septic shock secondary to E Coli Pyelonephritis, aspiration pneumonitis, question of small associated abscess - f/u E coli sensitivities - PICC - Zosyn monotherapy should be fine unless ESBL, 14 days therapy  Acute metabolic encephalopathy vs. ICU delirium complicated by baseline intellectual disability - Check ammonia, ABG if not waking up - Re orient, keep awake during day, asleep at night  Hx of epilepsy - Wean sedation, if not waking up needs EEG  Anorexia x 1 week - Cortrak/TF  Best practice:  Diet: NPO Pain/Anxiety/Delirium protocol (if indicated): wean precedex, would benefit from POs VAP protocol (if indicated): N/A DVT prophylaxis: lovenox 40mg  daily GI prophylaxis:  Glucose control: ISS Mobility: Code Status: Full  Family Communication: Discussed with sister at bedside Disposition: ICU   Patient critically ill due to septic shock Interventions to address this today pressor titration, antibiotics Risk of deterioration without these interventions is high  I personally spent 34 minutes providing critical care not including any separately billable procedures  Erskine Emery MD [facility] 04/17/2020 8:04 AM Personal  pager: [phone] If unanswered, please page CCM On-call: [phone]

## 2020-04-17 NOTE — Progress Notes (Signed)
SLP Cancellation Note  Patient Details Name: Maria [name] MRN: [id number] DOB: 12-20-1960   Cancelled evaluation:      Reviewed chart and spoke with RN - pt somnolent and unable to participate in swallow evaluation.  Will follow along for readiness.  [name] L. Tivis Ringer, [place] CCC/SLP Acute Rehabilitation Services Office number [phone] Pager [phone]      [name] 04/17/2020, 11:24 AM

## 2020-04-17 NOTE — Progress Notes (Signed)
  Echocardiogram 2D Echocardiogram has been performed.  Maria [name] 04/17/2020, 11:18 AM

## 2020-04-17 NOTE — Progress Notes (Signed)
Peripherally Inserted Central Catheter Placement  The IV Nurse has discussed with the patient and/or persons authorized to consent for the patient, the purpose of this procedure and the potential benefits and risks involved with this procedure.  The benefits include less needle sticks, lab draws from the catheter, and the patient may be discharged home with the catheter. Risks include, but not limited to, infection, bleeding, blood clot (thrombus formation), and puncture of an artery; nerve damage and irregular heartbeat and possibility to perform a PICC exchange if needed/ordered by physician.  Alternatives to this procedure were also discussed.  Bard Power PICC patient education guide, fact sheet on infection prevention and patient information card has been provided to patient /or left at bedside.    PICC Placement Documentation  PICC Double Lumen 04/17/20 PICC Right Cephalic 42 cm 0 cm (Active)  Indication for Insertion or Continuance of Line Vasoactive infusions 04/17/20 1140  Exposed Catheter (cm) 0 cm 04/17/20 1140  Site Assessment Clean;Dry;Intact 04/17/20 1140  Lumen #1 Status Flushed;Saline locked;Blood return noted 04/17/20 1140  Lumen #2 Status Flushed;Saline locked;Blood return noted 04/17/20 1140  Dressing Type Transparent;Securing device 04/17/20 1140  Dressing Status Clean;Dry;Intact 04/17/20 1140  Antimicrobial disc in place? Yes 04/17/20 1140  Safety Lock Not Applicable 01/00/71 2197  Dressing Intervention New dressing 04/17/20 1140  Dressing Change Due 04/24/20 04/17/20 Charlotte Hall, sister at bedside, signed consent for PICC placement.   Enos Fling 04/17/2020, 12:00 PM

## 2020-04-17 NOTE — Consult Note (Signed)
Consultation Note Date: 04/17/2020   Patient Name: Maria [name]  DOB: 09-15-1960  MRN: [id number]  Age / Sex: 59 y.o., female  PCP: Maria Squibb, MD Referring Physician: Candee Furbish, MD  Reason for Consultation: Establishing goals of care  HPI/Patient Profile: 60 y.o. female  admitted on 04/14/2020 with past  medical history significant for chronic constipation, epilepsy, intellectual disability, GERD, dysphagia, tremors who has been dealing with problems with left lower quadrant abdominal pain for several weeks that started out as intermittent and has progressively worsened.    She was seen in the ED recently and diagnosed with a vaginal infection.  She completed a course of metronidazole.  She continued to complain of left lower quadrant abdominal pain and discomfort.  She has had poor appetite and poor oral intake over the last 3 days.   Her family says that she rarely complains of pain but has recently been pointing to the left lower quadrant with grimacing and discomfort.    Her family brought her to the emergency department last night but left after 3 hours without being seen.  They tried to get her to her PCP office today however they were not able to any but the patient had increasing weakness and pain discomfort and EMS brought the patient to the emergency department.  ED Course: Patient arrived appearing acutely ill with fever 100.4 axillary, tachycardic with heart rate around 110.  Her blood pressure was 103/68.  She had good oxygen saturation on room air.  Sodium was 137, potassium 3.6, BUN 27, creatinine 1.67, AST 13, ALT 18, estimated GFR 35, lactic acid 1.3, WBC 15.0, hemoglobin 12.7, platelet count 126.  Urinalysis positive for large leukocytes, protein, bacteria and greater than 50 WBC per high-power field.  Unfortunately antibiotics have been given prior to blood cultures being drawn.  CT  abdomen/pelvis with findings suspicious for left pyelonephritis with a small left renal abscess.  Patient was started on antibiotics and IV fluid hydration and admission was requested.  Pt became acutely agitated while holding in ED and required IV haldol for sedation.  Currently in the ICU.  Treatment for septic sepsis shock secondary to E. coli pyelonephritis, aspiration pneumonia.  Patient has had a PICC line placed and a core track placed for nutritional support.  She remains intermittently agitated.  She is critically ill.  Family face treatment option decisions, advanced directive decisions and anticipatory care needs.     Clinical Assessment and Goals of Care:   This NP Maria [name] reviewed medical records, received report from team, assessed the patient and then meet at the patient's bedside along with her sister/Maria [name]  to discuss diagnosis, prognosis, GOC, EOL wishes disposition and options.   Concept of Palliative Care was introduced as specialized medical care for people and their families living with serious illness.  If focuses on providing relief from the symptoms and stress of a serious illness.  The goal is to improve quality of life for both the patient and the family.  Created space  and opportunity for sister/family to explore thoughts and feelings regarding current medical information     A  discussion was had today regarding advanced directives.  Concepts specific to code status, artifical feeding and hydration, continued IV antibiotics and rehospitalization was had.  The difference between a aggressive medical intervention path  and a palliative comfort care path for this patient at this time was had.  Values and goals of care important to patient and family were attempted to be elicited.   MOST form introduced.   Questions and concerns addressed.  Family   encouraged to call with questions or concerns.     PMT will continue to support holistically.     There are no  noted advanced directive documents or healthcare power of attorney.  The patient [name] is at the bedside and she tells me that she is the main decision maker however the patient does have a living parent/her mother and another living sibling.  It will be necessary to do do diligence to explore the legal decision maker in this situation.   I encouraged Maria [name] to bring in any documents regarding patient's medical decision maker.   In the event that there are no documents the patient's mother is the decision maker for this patient.    SUMMARY OF RECOMMENDATIONS    Code Status/Advance Care Planning:  Full code   Palliative Prophylaxis:   Aspiration, Bowel Regimen, Delirium Protocol, Frequent Pain Assessment and Oral Care  Additional Recommendations (Limitations, Scope, Preferences):  Full Scope Treatment  Psycho-social/Spiritual:   Desire for further Chaplaincy support:yes  Discussed in detail with sister at bedside the importance of clarification of decision maker for this patient specifically that she does not have medical decisional capacity.  I offered to meet with both the patient's mother and siblings at their convenience, will await callback.  Prognosis:   Unable to determine  Discharge Planning: To Be Determined      Primary Diagnoses: Present on Admission: . Pyelonephritis of left kidney . Renal abscess . Leukocytosis . Fever . LLQ abdominal pain . AKI (acute kidney injury) ([place]) . Generalized convulsive epilepsy ([place]) . GERD (gastroesophageal reflux disease) . Sepsis secondary to UTI [facility])   I have reviewed the medical record, interviewed the patient and family, and examined the patient. The following aspects are pertinent.  Past Medical History:  Diagnosis Date  . Constipation   . Dysphagia   . Fracture    R foot  . GERD (gastroesophageal reflux disease)   . History of shingles 10/2016  . Mental retardation    lesion in head  .  Seizures ([place])    "not fully controlled on max doses of meds" (Neuro ofc note 12/2014)  . Thrombocytopenia ([place])   . Tremor    Social History   Socioeconomic History  . Marital status: Single    Spouse name: Not on file  . Number of children: 0  . Years of education: 10  . Highest education level: Not on file  Occupational History    Employer: UNEMPLOYED  Tobacco Use  . Smoking status: Former Smoker    Packs/day: 1.50    Years: 15.00    Pack years: 22.50    Types: Cigarettes    Quit date: 05/22/2006    Years since quitting: 13.9  . Smokeless tobacco: Never Used  Substance and Sexual Activity  . Alcohol use: No    Alcohol/week: 0.0 standard drinks  . Drug use: No  . Sexual activity: Never  Birth control/protection: Other-see comments    Comment: Hysterectomy  Other Topics Concern  . Not on file  Social History Narrative   Patient is single and lives with her mother Maria [name])      Patient drinks 3 cups of caffeine daily.   Patient is right handed.         Social Determinants of Health   Financial Resource Strain:   . Difficulty of Paying Living Expenses: Not on file  Food Insecurity:   . Worried About Charity fundraiser in the Last Year: Not on file  . Ran Out of Food in the Last Year: Not on file  Transportation Needs:   . Lack of Transportation (Medical): Not on file  . Lack of Transportation (Non-Medical): Not on file  Physical Activity:   . Days of Exercise per Week: Not on file  . Minutes of Exercise per Session: Not on file  Stress:   . Feeling of Stress : Not on file  Social Connections:   . Frequency of Communication with Friends and Family: Not on file  . Frequency of Social Gatherings with Friends and Family: Not on file  . Attends Religious Services: Not on file  . Active Member of Clubs or Organizations: Not on file  . Attends Archivist Meetings: Not on file  . Marital Status: Not on file   Family History  Problem Relation Age of  Onset  . High Cholesterol Mother   . High blood pressure Mother   . Diabetes Father    Scheduled Meds: . atorvastatin  20 mg Per Tube QPM  . chlorhexidine  15 mL Mouth Rinse BID  . Chlorhexidine Gluconate Cloth  6 each Topical Daily  . clonazepam  0.5 mg Per Tube BID  . enoxaparin (LOVENOX) injection  40 mg Subcutaneous Q24H  . feeding supplement (PROSource TF)  45 mL Per Tube BID  . insulin aspart  0-9 Units Subcutaneous Q4H  . ipratropium-albuterol  3 mL Nebulization Q4H  . lacosamide  200 mg Per Tube BID  . mouth rinse  15 mL Mouth Rinse q12n4p  . multivitamin with minerals  1 tablet Per Tube Daily  . pantoprazole sodium  40 mg Per Tube Daily  . QUEtiapine  25 mg Per Tube QHS  . sodium chloride flush  10-40 mL Intracatheter Q12H  . valproic acid  500 mg Per Tube BID   Continuous Infusions: . sodium chloride 10 mL/hr at 04/17/20 1300  . cefTRIAXone (ROCEPHIN)  IV Stopped (04/17/20 1236)  . dexmedetomidine (PRECEDEX) IV infusion 1.2 mcg/kg/hr (04/17/20 1300)  . dextrose Stopped (04/17/20 1156)  . feeding supplement (OSMOLITE 1.5 CAL) 1,000 mL (04/17/20 1020)  . norepinephrine (LEVOPHED) Adult infusion Stopped (04/17/20 0745)   PRN Meds:.sodium chloride, bisacodyl, haloperidol lactate, HYDROmorphone (DILAUDID) injection, LORazepam, ondansetron **OR** ondansetron (ZOFRAN) IV, sodium chloride flush Medications Prior to Admission:  Prior to Admission medications   Medication Sig Start Date End Date Taking? Authorizing Provider  atorvastatin (LIPITOR) 20 MG tablet Take 20 mg by mouth daily. 08/24/18  Yes [provider]  clonazePAM (KLONOPIN) 0.5 MG tablet Take 1 tablet (0.5 mg total) by mouth 2 (two) times daily. 04/13/20  Yes Suzzanne Cloud, NP  divalproex (DEPAKOTE ER) 500 MG 24 hr tablet Take 1 tablet (500 mg total) by mouth 2 (two) times daily. 11/25/19  Yes Suzzanne Cloud, NP  ibuprofen (ADVIL) 200 MG tablet Take 400 mg by mouth every 6 (six) hours as needed for moderate  pain.  Yes [provider]  lacosamide (VIMPAT) 200 MG TABS tablet Take 1 tablet (200 mg total) by mouth 2 (two) times daily. 11/25/19  Yes Suzzanne Cloud, NP  levocetirizine (XYZAL) 5 MG tablet Take 5 mg by mouth every evening.    Yes [provider]  omeprazole (PRILOSEC) 40 MG capsule Take 1 capsule (40 mg total) by mouth daily. 11/03/19  Yes Laurine Blazer A, PA-C  oxybutynin (DITROPAN-XL) 5 MG 24 hr tablet Take 5 mg by mouth at bedtime.    Yes [provider]  Pediatric Multiple Vit-C-FA (PEDIATRIC MULTIVITAMIN) chewable tablet Chew 1 tablet by mouth daily.     Yes [provider]   Allergies  Allergen Reactions  . Codeine Nausea And Vomiting  . Lamictal [Lamotrigine] Rash   Review of Systems  Unable to perform ROS: Acuity of condition    Physical Exam  Vital Signs: BP 119/70   Pulse 63   Temp 98.6 F (37 C) (Axillary)   Resp 17   Ht 5\' 6"  (1.676 m)   Wt 67.1 kg   SpO2 100%   BMI 23.88 kg/m  Pain Scale: CPOT   Pain Score: 0-No pain   SpO2: SpO2: 100 % O2 Device:SpO2: 100 % O2 Flow Rate: .O2 Flow Rate (L/min): 8 L/min  IO: Intake/output summary:   Intake/Output Summary (Last 24 hours) at 04/17/2020 1357 Last data filed at 04/17/2020 1300 Gross per 24 hour  Intake 2776.66 ml  Output 300 ml  Net 2476.66 ml    LBM: Last BM Date:  (PTA) Baseline Weight: Weight: 67.1 kg Most recent weight: Weight: 67.1 kg     Palliative Assessment/Data:   Discussed with Dr Tamala Julian  Time In: 1000 Time Out: 1110 Time Total: 70 minutes Greater than 50%  of this time was spent counseling and coordinating care related to the above assessment and plan.  Signed by: Maria Lessen, NP   Please contact Palliative Medicine Team phone at [phone] for questions and concerns.  For individual provider: See Shea Evans

## 2020-04-17 NOTE — Procedures (Signed)
Cortrak  Person Inserting Tube:  Jacklynn Barnacle E, RD Tube Type:  Cortrak - 43 inches Tube Location:  Right nare Initial Placement:  Stomach Secured by: Bridle Technique Used to Measure Tube Placement:  Documented cm marking at nare/ corner of mouth Cortrak Secured At:  67 cm    Cortrak Tube Team Note:  Consult received to place a Cortrak feeding tube.   No x-ray is required. RN may begin using tube.   If the tube becomes dislodged please keep the tube and contact the Cortrak team at [email or web address] ([email or web address]) for replacement.  If after hours and replacement cannot be delayed, place a NG tube and confirm placement with an abdominal x-ray.   Jacklynn Barnacle, MS, RD, LDN Pager number available on Amion

## 2020-04-18 ENCOUNTER — Encounter (HOSPITAL_COMMUNITY): Payer: Self-pay | Admitting: Pharmacist

## 2020-04-18 DIAGNOSIS — N12 Tubulo-interstitial nephritis, not specified as acute or chronic: Secondary | ICD-10-CM | POA: Diagnosis not present

## 2020-04-18 DIAGNOSIS — G9341 Metabolic encephalopathy: Secondary | ICD-10-CM

## 2020-04-18 DIAGNOSIS — Z515 Encounter for palliative care: Secondary | ICD-10-CM

## 2020-04-18 DIAGNOSIS — N179 Acute kidney failure, unspecified: Secondary | ICD-10-CM | POA: Diagnosis not present

## 2020-04-18 LAB — BASIC METABOLIC PANEL
Anion gap: 10 (ref 5–15)
Anion gap: 9 (ref 5–15)
BUN: 38 mg/dL — ABNORMAL HIGH (ref 6–20)
BUN: 41 mg/dL — ABNORMAL HIGH (ref 6–20)
CO2: 24 mmol/L (ref 22–32)
CO2: 27 mmol/L (ref 22–32)
Calcium: 8.8 mg/dL — ABNORMAL LOW (ref 8.9–10.3)
Calcium: 8.6 mg/dL — ABNORMAL LOW (ref 8.9–10.3)
Chloride: 115 mmol/L — ABNORMAL HIGH (ref 98–111)
Chloride: 115 mmol/L — ABNORMAL HIGH (ref 98–111)
Creatinine, Ser: 0.81 mg/dL (ref 0.44–1.00)
Creatinine, Ser: 0.72 mg/dL (ref 0.44–1.00)
GFR, Estimated: >60 mL/min (ref >60)
GFR, Estimated: >60 mL/min (ref >60)
Glucose, Bld: 186 mg/dL — ABNORMAL HIGH (ref 70–99)
Glucose, Bld: 161 mg/dL — ABNORMAL HIGH (ref 70–99)
Potassium: 4 mmol/L (ref 3.5–5.1)
Potassium: 3.7 mmol/L (ref 3.5–5.1)
Sodium: 149 mmol/L — ABNORMAL HIGH (ref 135–145)
Sodium: 151 mmol/L — ABNORMAL HIGH (ref 135–145)

## 2020-04-18 LAB — VALPROIC ACID LEVEL: Valproic Acid Lvl: 50 ug/mL (ref 50.0–100.0)

## 2020-04-18 LAB — GLUCOSE, CAPILLARY
Glucose-Capillary: 149 mg/dL — ABNORMAL HIGH (ref 70–99)
Glucose-Capillary: 145 mg/dL — ABNORMAL HIGH (ref 70–99)
Glucose-Capillary: 129 mg/dL — ABNORMAL HIGH (ref 70–99)
Glucose-Capillary: 159 mg/dL — ABNORMAL HIGH (ref 70–99)
Glucose-Capillary: 114 mg/dL — ABNORMAL HIGH (ref 70–99)
Glucose-Capillary: 134 mg/dL — ABNORMAL HIGH (ref 70–99)

## 2020-04-18 LAB — CBC WITH DIFFERENTIAL/PLATELET
Abs Immature Granulocytes: 0.1 10*3/uL — ABNORMAL HIGH (ref 0.00–0.07)
Basophils Absolute: 0 10*3/uL (ref 0.0–0.1)
Basophils Relative: 0 %
Eosinophils Absolute: 0 10*3/uL (ref 0.0–0.5)
Eosinophils Relative: 0 %
HCT: 30.8 % — ABNORMAL LOW (ref 36.0–46.0)
Hemoglobin: 9.6 g/dL — ABNORMAL LOW (ref 12.0–15.0)
Immature Granulocytes: 1 %
Lymphocytes Relative: 15 %
Lymphs Abs: 1.1 10*3/uL (ref 0.7–4.0)
MCH: 31.3 pg (ref 26.0–34.0)
MCHC: 31.2 g/dL (ref 30.0–36.0)
MCV: 100.3 fL — ABNORMAL HIGH (ref 80.0–100.0)
Monocytes Absolute: 0.5 10*3/uL (ref 0.1–1.0)
Monocytes Relative: 7 %
Neutro Abs: 5.4 10*3/uL (ref 1.7–7.7)
Neutrophils Relative %: 77 %
Platelets: 146 10*3/uL — ABNORMAL LOW (ref 150–400)
RBC: 3.07 MIL/uL — ABNORMAL LOW (ref 3.87–5.11)
RDW: 14.8 % (ref 11.5–15.5)
WBC: 7.1 10*3/uL (ref 4.0–10.5)
nRBC: 0 % (ref 0.0–0.2)

## 2020-04-18 LAB — DIC (DISSEMINATED INTRAVASCULAR COAGULATION)PANEL
D-Dimer, Quant: 3.8 ug/mL-FEU — ABNORMAL HIGH (ref 0.00–0.50)
Fibrinogen: 590 mg/dL — ABNORMAL HIGH (ref 210–475)
INR: 1.2 (ref 0.8–1.2)
Platelets: 131 10*3/uL — ABNORMAL LOW (ref 150–400)
Prothrombin Time: 14.2 seconds (ref 11.4–15.2)
Smear Review: NONE SEEN
aPTT: 38 seconds — ABNORMAL HIGH (ref 24–36)

## 2020-04-18 LAB — LACTATE DEHYDROGENASE: LDH: 245 U/L — ABNORMAL HIGH (ref 98–192)

## 2020-04-18 LAB — AMMONIA: Ammonia: 76 umol/L — ABNORMAL HIGH (ref 9–35)

## 2020-04-18 LAB — PHOSPHORUS
Phosphorus: 2 mg/dL — ABNORMAL LOW (ref 2.5–4.6)
Phosphorus: 1.9 mg/dL — ABNORMAL LOW (ref 2.5–4.6)

## 2020-04-18 LAB — MAGNESIUM
Magnesium: 2.4 mg/dL (ref 1.7–2.4)
Magnesium: 2.3 mg/dL (ref 1.7–2.4)

## 2020-04-18 MED ORDER — QUETIAPINE FUMARATE 25 MG PO TABS
25.0000 mg | ORAL_TABLET | Freq: Every day | ORAL | Status: DC
  Administered 2020-04-18: 25 mg via ORAL
  Filled 2020-04-18: qty 1

## 2020-04-18 MED ORDER — POTASSIUM & SODIUM PHOSPHATES 280-160-250 MG PO PACK
1.0000 | PACK | Freq: Three times a day (TID) | ORAL | Status: DC
  Administered 2020-04-18: 1 via ORAL
  Filled 2020-04-18 (×2): qty 1

## 2020-04-18 MED ORDER — FREE WATER
200.0000 mL | Status: DC
  Administered 2020-04-18 – 2020-04-19 (×7): 200 mL

## 2020-04-18 MED ORDER — BISACODYL 10 MG RE SUPP
10.0000 mg | Freq: Every day | RECTAL | Status: DC | PRN
  Administered 2020-04-21: 10 mg via RECTAL
  Filled 2020-04-18: qty 1

## 2020-04-18 MED ORDER — POTASSIUM & SODIUM PHOSPHATES 280-160-250 MG PO PACK
1.0000 | PACK | Freq: Three times a day (TID) | ORAL | Status: AC
  Administered 2020-04-18 (×2): 1
  Filled 2020-04-18 (×2): qty 1

## 2020-04-18 MED ORDER — QUETIAPINE FUMARATE 50 MG PO TABS
50.0000 mg | ORAL_TABLET | Freq: Two times a day (BID) | ORAL | Status: DC
  Administered 2020-04-18: 50 mg
  Filled 2020-04-18: qty 1

## 2020-04-18 MED ORDER — LORAZEPAM 2 MG/ML IJ SOLN
2.0000 mg | Freq: Once | INTRAMUSCULAR | Status: AC
  Administered 2020-04-18: 2 mg via INTRAVENOUS
  Filled 2020-04-18: qty 1

## 2020-04-18 NOTE — Progress Notes (Signed)
Phos 2.0 ?Replaced per protocol  ?

## 2020-04-18 NOTE — Progress Notes (Signed)
eLink Physician-Brief Progress Note Patient Name: Maria [name] DOB: [birth year] MRN: [id number]   Date of Service  04/18/2020  HPI/Events of Note  Notified of agitation  Patient seen screaming with legs hanging on the side of the bed. Sister trying to calm her down states this is not her baseline. She confirmed with family that patient takes Klonopin 0.5 mg BID. Already given Haldol 2 hours ago and on Seroquel as well at max Precedex. Has prolonged QT  eICU Interventions  Will give a dose Ativan 2 mg IV. Reportedly this has calmed her down on previous episodes. Receiving water flushes for Na of 151. On antibiotic coverage for UTI and pneumonia     Intervention Category Minor Interventions: Agitation / anxiety - evaluation and management  Judd Lien 04/18/2020, 8:24 PM

## 2020-04-18 NOTE — Progress Notes (Signed)
SLP Cancellation Note  Patient Details Name: Maria [name] MRN: [id number] DOB: 10/13/1960   Cancelled treatment:        Pt in bed and unarousable this morning per RN. Not appropriate for swallow assessment presently. Will check back tomorrow.    Houston Siren 04/18/2020, 10:05 AM   Orbie Pyo Colvin Caroli.Ed Risk analyst [phone] Office [phone]

## 2020-04-18 NOTE — Progress Notes (Addendum)
04/18/2020 PCCM Attending Note I saw and evaluated the patient. Discussed with resident and agree with resident's findings and plan as documented in the resident's note.  I have seen and evaluated the patient for septic shock and metabolic encephalopathy.  S:  Remains agitated, not able to redirect. Bps improved.  O: Blood pressure (!) 146/86, pulse (!) 58, temperature (!) 97.2 F (36.2 C), temperature source Axillary, resp. rate 16, height 5\' 6"  (1.676 m), weight 69.5 kg, SpO2 93 %.  Agitated woman writhing around in bed Heart sounds regular, ext warm Abd soft, +BS Rash noted stable Moving all ext equally Occasionally says intelligible words but mostly moaning  Sodium up Cr good CBC stable CBGs okay  A:  Septic shock secondary to E coli pyelonephritis Rash- DIC/LDH sent Acute metabolic encephalopathy with agitated delirium with baseline intellectual disability- persistent Various electrolyte abnormalities being corrected Prolonged QTc  P:  Re-orient as able, lights on during day, encourage sleep at night Family in room would be helpful Continue depakote, vimpat, clonazepam Ceftriaxone x 14 days Check vimpat level, ammonia TF, start FWF Try to wean precedex Have to hold steady with seroquel for now given QTc, recheck AM EKG Stay in ICU while on precedex   Patient critically ill due to metabolic encephalopathy Interventions to address this today weaning high risk medications (precedex) Risk of deterioration without these interventions is high  I personally spent 34 minutes providing critical care not including any separately billable procedures  Erskine Emery MD [facility] 04/18/2020 10:37 AM Personal pager: [phone] If unanswered, please page CCM On-call: [phone]     NAME:  Maria [name], MRN:  [id number], DOB:  [birth year], LOS: 4 ADMISSION DATE:  04/14/2020, CONSULTATION DATE:  04/16/20 REFERRING MD:  Forestine Na, Dr. Wynetta Emery,  CHIEF COMPLAINT:  sepsis   Brief History   59 year old woman with hx of epilepsy, intellectual disability, GERD, here with sepsis from pyelonephritis.    Past Medical History   Intellectual disability Epilepsy Chronic constipation Dysphagia  Significant Hospital Events   11/12 admitted w/ septic shock and acute TME 11/15 PICC and CorTrak placed.  11/16 off pressors. Still agitated and delirious. On mono rx for ecoli  Consults:    Procedures:  RUE PICC 11/15   Significant Diagnostic Tests:   CT abdomen:  Findings suspicious for pyelonephritis and small abscess in lower pole of left kidney. Suggest correlation with urinalysis, and recommend continued follow-up by CT to confirm resolution. No evidence of ureteral calculi or hydronephrosis.   Micro Data:  Urine cutlure:  (pansens) Ecoli  Blood culture: Negative  Antimicrobials:  Ceftriaxone 2g 11/12 --> Azithro 11/14 --11/15 Zosyn 11/14 --> 11/15  Interim history/subjective:  Off pressors still agitated   Objective   Blood pressure 130/70, pulse 67, temperature (Abnormal) 97.2 F (36.2 C), temperature source Axillary, resp. rate (Abnormal) 25, height 5\' 6"  (1.676 m), weight 69.5 kg, SpO2 90 %.        Intake/Output Summary (Last 24 hours) at 04/18/2020 0814 Last data filed at 04/18/2020 0700 Gross per 24 hour  Intake 1760.34 ml  Output 300 ml  Net 1460.34 ml   Filed Weights   04/14/20 0901 04/18/20 0439  Weight: 67.1 kg 69.5 kg    Examination:  General this is a 59 year old white female she is agitated and moaning in the bed. No distress but not cooperative or really interactive  HENT NCAT no JVD  Pulm scattered rhonchi no accessory use  Card RRR abd  soft not tender Derm diffuse petechial rash no edema  Neuro moves all extremities (flailing about) [name] Problem list     Assessment & Plan:  Septic shock secondary to E Coli Pyelonephritis, aspiration pneumonitis,  question of small associated abscess Pan-sensitive Now off pressors Plan Cont tele Keep euvolemic Abx day # 5/14 (currently on ctx)   Petechial rash  -plts actually improving; ? Sepsis related.  Plan Ck LDH Ck DIC panel   Acute metabolic encephalopathy vs. ICU delirium complicated by baseline intellectual disability & Hx of epilepsy -She is on multiple AEDs: Depakote ER, Vimpat, Clonazepam at home -Na still elevated -still very confused/agitated Plan Cont depakote, Vimpat and Clonazepam VT Will get valproic acid level Ck ammonia (had planned to get yesterday) Treat infection Inc seroquel Wean precedex Encourage family support.  Need to get out of ICU as soon as we can  Fluid and Electrolyte imbalance: hypophosphatemia, Hypernatremia, hyperchloremia, mild Azotemia -free water def: 2250ml; she has not been getting her D5w Plan D5W at 32ml/hr Add free water VT Will change chemistries to q 8 to ensure we don't correct Na to quickly Replace PO4 Strict I&O  Anorexia x 1 week->cortrak placed Plan tubefeeds  Best practice:  Diet: NPO Pain/Anxiety/Delirium protocol (if indicated): wean precedex, would benefit from POs VAP protocol (if indicated): N/A DVT prophylaxis: lovenox 40mg  daily GI prophylaxis:  Glucose control: ISS Mobility: Code Status: Full  Family Communication: Discussed with sister at bedside Disposition: ICU  My cct 34 min  Erick Colace ACNP-BC [place] Pager # [phone] OR # [phone] if no answer

## 2020-04-19 DIAGNOSIS — R451 Restlessness and agitation: Secondary | ICD-10-CM

## 2020-04-19 DIAGNOSIS — N179 Acute kidney failure, unspecified: Secondary | ICD-10-CM | POA: Diagnosis not present

## 2020-04-19 DIAGNOSIS — G9340 Encephalopathy, unspecified: Secondary | ICD-10-CM | POA: Diagnosis not present

## 2020-04-19 DIAGNOSIS — Z515 Encounter for palliative care: Secondary | ICD-10-CM | POA: Diagnosis not present

## 2020-04-19 DIAGNOSIS — N12 Tubulo-interstitial nephritis, not specified as acute or chronic: Secondary | ICD-10-CM | POA: Diagnosis not present

## 2020-04-19 LAB — CBC WITH DIFFERENTIAL/PLATELET
Abs Immature Granulocytes: 0.42 10*3/uL — ABNORMAL HIGH (ref 0.00–0.07)
Basophils Absolute: 0.1 10*3/uL (ref 0.0–0.1)
Basophils Relative: 1 %
Eosinophils Absolute: 0.1 10*3/uL (ref 0.0–0.5)
Eosinophils Relative: 1 %
HCT: 30.8 % — ABNORMAL LOW (ref 36.0–46.0)
Hemoglobin: 9.6 g/dL — ABNORMAL LOW (ref 12.0–15.0)
Immature Granulocytes: 5 %
Lymphocytes Relative: 25 %
Lymphs Abs: 2 10*3/uL (ref 0.7–4.0)
MCH: 31.4 pg (ref 26.0–34.0)
MCHC: 31.2 g/dL (ref 30.0–36.0)
MCV: 100.7 fL — ABNORMAL HIGH (ref 80.0–100.0)
Monocytes Absolute: 0.8 10*3/uL (ref 0.1–1.0)
Monocytes Relative: 9 %
Neutro Abs: 4.8 10*3/uL (ref 1.7–7.7)
Neutrophils Relative %: 59 %
Platelets: 181 10*3/uL (ref 150–400)
RBC: 3.06 MIL/uL — ABNORMAL LOW (ref 3.87–5.11)
RDW: 15.2 % (ref 11.5–15.5)
WBC: 8.1 10*3/uL (ref 4.0–10.5)
nRBC: 1.5 % — ABNORMAL HIGH (ref 0.0–0.2)

## 2020-04-19 LAB — BASIC METABOLIC PANEL
Anion gap: 9 (ref 5–15)
BUN: 34 mg/dL — ABNORMAL HIGH (ref 6–20)
CO2: 28 mmol/L (ref 22–32)
Calcium: 8.5 mg/dL — ABNORMAL LOW (ref 8.9–10.3)
Chloride: 112 mmol/L — ABNORMAL HIGH (ref 98–111)
Creatinine, Ser: 0.72 mg/dL (ref 0.44–1.00)
GFR, Estimated: >60 mL/min (ref >60)
Glucose, Bld: 166 mg/dL — ABNORMAL HIGH (ref 70–99)
Potassium: 3.5 mmol/L (ref 3.5–5.1)
Sodium: 149 mmol/L — ABNORMAL HIGH (ref 135–145)

## 2020-04-19 LAB — GLUCOSE, CAPILLARY
Glucose-Capillary: 117 mg/dL — ABNORMAL HIGH (ref 70–99)
Glucose-Capillary: 108 mg/dL — ABNORMAL HIGH (ref 70–99)
Glucose-Capillary: 134 mg/dL — ABNORMAL HIGH (ref 70–99)
Glucose-Capillary: 150 mg/dL — ABNORMAL HIGH (ref 70–99)
Glucose-Capillary: 139 mg/dL — ABNORMAL HIGH (ref 70–99)
Glucose-Capillary: 118 mg/dL — ABNORMAL HIGH (ref 70–99)

## 2020-04-19 LAB — CULTURE, BLOOD (ROUTINE X 2)
Culture: NO GROWTH
Culture: NO GROWTH
Special Requests: ADEQUATE
Special Requests: ADEQUATE

## 2020-04-19 LAB — MAGNESIUM: Magnesium: 2.2 mg/dL (ref 1.7–2.4)

## 2020-04-19 LAB — PHOSPHORUS: Phosphorus: 2.9 mg/dL (ref 2.5–4.6)

## 2020-04-19 MED ORDER — LACTULOSE 10 GM/15ML PO SOLN
10.0000 g | Freq: Three times a day (TID) | ORAL | Status: DC
  Administered 2020-04-19: 10 g via ORAL
  Filled 2020-04-19: qty 15

## 2020-04-19 MED ORDER — QUETIAPINE FUMARATE 50 MG PO TABS
50.0000 mg | ORAL_TABLET | Freq: Two times a day (BID) | ORAL | Status: DC
  Administered 2020-04-19 – 2020-04-26 (×13): 50 mg
  Filled 2020-04-19 (×13): qty 1

## 2020-04-19 MED ORDER — POTASSIUM CHLORIDE 20 MEQ/15ML (10%) PO SOLN
40.0000 meq | Freq: Once | ORAL | Status: AC
  Administered 2020-04-19: 40 meq
  Filled 2020-04-19: qty 30

## 2020-04-19 MED ORDER — ONDANSETRON HCL 4 MG/2ML IJ SOLN
4.0000 mg | Freq: Four times a day (QID) | INTRAMUSCULAR | Status: DC | PRN
  Administered 2020-04-20 – 2020-04-22 (×5): 4 mg via INTRAVENOUS
  Filled 2020-04-19 (×5): qty 2

## 2020-04-19 MED ORDER — SODIUM CHLORIDE 3 % IN NEBU
4.0000 mL | INHALATION_SOLUTION | Freq: Two times a day (BID) | RESPIRATORY_TRACT | Status: DC
  Administered 2020-04-19: 4 mL via RESPIRATORY_TRACT
  Filled 2020-04-19 (×2): qty 4

## 2020-04-19 MED ORDER — FREE WATER
200.0000 mL | Status: DC
  Administered 2020-04-19 – 2020-04-26 (×23): 200 mL

## 2020-04-19 MED ORDER — QUETIAPINE FUMARATE 50 MG PO TABS: 50.0000 mg | ORAL_TABLET | Freq: Every day | ORAL | Status: DC

## 2020-04-19 MED ORDER — SODIUM CHLORIDE 3 % IN NEBU
4.0000 mL | INHALATION_SOLUTION | Freq: Two times a day (BID) | RESPIRATORY_TRACT | Status: DC
  Administered 2020-04-19 – 2020-04-24 (×10): 4 mL via RESPIRATORY_TRACT
  Filled 2020-04-19 (×14): qty 4

## 2020-04-19 MED ORDER — LACTULOSE 10 GM/15ML PO SOLN
10.0000 g | Freq: Three times a day (TID) | ORAL | Status: DC
  Administered 2020-04-19 (×2): 10 g
  Filled 2020-04-19 (×2): qty 15

## 2020-04-19 MED ORDER — ONDANSETRON HCL 4 MG PO TABS: 4.0000 mg | ORAL_TABLET | Freq: Four times a day (QID) | ORAL | Status: DC | PRN

## 2020-04-19 MED ORDER — QUETIAPINE FUMARATE 25 MG PO TABS: 25.0000 mg | ORAL_TABLET | Freq: Every day | ORAL | Status: DC

## 2020-04-19 NOTE — Progress Notes (Addendum)
Patient ID: Maria [name], female   DOB: December 11, 1960, 59 y.o.   MRN: [id number]  This NP visited patient at the bedside as a follow up for palliative medicine needs and emotional support.  Sister/ Maria [name] at bedside.  Patient remains critically ill 2/2 E. coli pyelonephritis and septic shock, she continues with  extremely agitatation.  Continued adjustments to medications ( see MAR), collaboration with pharmacists on unit and CCM /Dr. Tamala Julian  Increase Seroquel 50 mg via tube twice daily/agitation.  Paperwork documenting healthcare power of attorney presented.  Patient's mother is listed first and her Sister Maria [name] is listed second as Maria [name].  Maria [name] speaks to the fact that her mother is elderly and sick herself and unable to come to the hospital; they work together in unison to make good decisions in the best interest of the patient.  Continued conversation with Maria [name] regarding the importance of continued conversation with her  family members  and the medical providers regarding overall plan of care and treatment options,  ensuring decisions are within the context of the patients values and GOCs.  Family face ongoing decisions related to treatment options, advance directives and anticipatory care needs.  Therapeutic listening and emotional support offered.  PMT will continue to support holistically  Questions and concerns addressed   Discussed with Dr Tamala Julian  Total time spent on the unit was 35 minutes  Greater than 50% of the time was spent in counseling and coordination of care  Wadie Lessen NP  Palliative Medicine Team Team Phone # [phone] Pager [phone]

## 2020-04-19 NOTE — Progress Notes (Signed)
K+ 3.5  Replaced per protocol  

## 2020-04-19 NOTE — Progress Notes (Signed)
NTS with small amount of hemoptysis. Increased from 10L to 15L HFNC.  Deep oral suction. Small amount of thick white/tan.

## 2020-04-19 NOTE — Progress Notes (Addendum)
04/19/2020 I saw and evaluated the patient. Discussed with resident and agree with resident's findings and plan as documented in the resident's note.  I have seen and evaluated the patient for sepsis and delirium.  S:  Remains agitated, confused, on oxygen.  Sister at bedside.  O: Blood pressure 112/76, pulse 84, temperature (!) 96.6 F (35.9 C), temperature source Axillary, resp. rate (!) 26, height 5\' 6"  (1.676 m), weight 69.5 kg, SpO2 (!) 89 %.  Ill appearing woman writhing around in bed Retained upper airway secretions, mildly tachypneic No peripheral edema Heart sounds regular, ext warm Follows commands with repeated prompting NGT in place Rash which is chronic per sister  Ammonia elevated Sodium improved with FWF WBC/Hgb stable BUN improved  A:  - Septic shock secondary to E.coli pyelonephritis - Acute metabolic encephalopathy- agitated/septic delirium +/- HE on top of baseline intellectual disability and anxiety disorder.  Depakote level okay. - Hypothermia improved with external warming - Prolonged QTc complicating ability to treat delirium  P:  - Ceftriaxone x 14 days - Continue depakote, vimpat, clonazepam, qHS seroquel - Encourage family visiting, day/night cycles - Recheck EKG, if QTc okay increase seroquel - Start lactulose - Increase FWF - Wean precedex - Need to start exercising her during day, hope PT can get her up in chair  Patient critically ill due to acute metabolic encephalopathy, acute hypoxemic respiratory failure Interventions to address this today increasing antipsychotics, reorientation, titration of precedex, and starting lactulose Risk of deterioration without these interventions is high  I personally spent 32 minutes providing critical care not including any separately billable procedures  Erskine Emery MD [facility] 04/19/2020 8:07 AM Personal pager: [phone] If unanswered, please page CCM On-call:  [phone]       NAME:  Maria [name], MRN:  [id number], DOB:  03-08-1961, LOS: 5 ADMISSION DATE:  04/14/2020, CONSULTATION DATE:  04/16/20 REFERRING MD:  Forestine Na, Dr. Wynetta Emery, CHIEF COMPLAINT:  sepsis   Brief History   59 year old woman with hx of epilepsy, intellectual disability, GERD, here with sepsis from pyelonephritis.   Past Medical History  Intellectual disability Epilepsy Chronic constipation Dysphagia  Significant Hospital Events   11/12 admitted w/ septic shock and acute TME 11/15 PICC and CorTrak placed.  11/16 off pressors. Still agitated and delirious. On mono rx for ecoli  Consults:    Procedures:  RUE PICC 11/15  Significant Diagnostic Tests:  CT abdomen:  Findings suspicious for pyelonephritis and small abscess in lower pole of left kidney. Suggest correlation with urinalysis, and recommend continued follow-up by CT to confirm resolution. No evidence of ureteral calculi or hydronephrosis.   Micro Data:  Urine cutlure:  (pansens) Ecoli  Blood culture: Negative  Antimicrobials:  Ceftriaxone 2g 11/12 --> Azithro 11/14 --11/15 Zosyn 11/14 --> 11/15  Interim history/subjective:  No acute overnight. Continues to be agitated and moaning in bed. Sister at bedside.   Objective   Blood pressure 112/76, pulse 84, temperature (!) 96.6 F (35.9 C), temperature source Axillary, resp. rate (!) 26, height 5\' 6"  (1.676 m), weight 69.5 kg, SpO2 (!) 89 %.        Intake/Output Summary (Last 24 hours) at 04/19/2020 0721 Last data filed at 04/19/2020 1275 Gross per 24 hour  Intake 2021.39 ml  Output 927 ml  Net 1094.39 ml   Filed Weights   04/14/20 0901 04/18/20 0439 04/19/20 0359  Weight: 67.1 kg 69.5 kg 69.5 kg    Examination:  General: caucasian female, agitated and  moaning in the bed. No acute distress  HENT NCAT no JVD  Pulm scattered rhonchi no accessory use  Card: RRR, no murmurs abd: soft not tender, no masses Derm diffuse  petechial rash no edema  Neuro moves all extremities (flailing about), responds to names follows commands intermittently GU improved [facility] Problem list     Assessment & Plan:  Septic shock secondary to E Coli Pyelonephritis, aspiration pneumonitis, question of small associated abscess Pan-sensitive Now off pressors Plan Hypertonic saline nebulizer CPT Abx day # 6/14 (currently on ctx)  Petechial rash  -plts continue improving; ? Sepsis related. Plan Continue to monitor   Acute metabolic encephalopathy vs. ICU delirium complicated by baseline intellectual disability & Hx of epilepsy -She is on multiple AEDs: Depakote ER, Vimpat, Clonazepam at home -Na still elevated, improved to 149 -Ammonia elevated at 76 -still very confused/agitated Plan Start lactulose 10 g TID Cont depakote, Vimpat, Clonazepam, VT, and seroquel Treat infection Wean precedex Encourage family support.  Need to get out of ICU as soon as we can  Fluid and Electrolyte imbalance:  Hypernatremia, hyperchloremia, mild Azotemia Hypophosphatemia: improved -free water def: 2223ml -phosphorus improved to 2.9  Plan Continue free water  Monitor electrolytes  Strict I&O  Anorexia  cortrak placed tolerating tube feeds at goal rate Plan Continue tube feeds  Best practice:  Diet: Tube feeds Pain/Anxiety/Delirium protocol (if indicated): wean precedex VAP protocol (if indicated): N/A DVT prophylaxis: lovenox 40mg  daily GI prophylaxis: Protonix  Glucose control: ISS Mobility: PT  Code Status: Full  Family Communication: Discussed with sister at bedside Disposition: ICU  Iona Beard Internal Medicine PGY-1  04/19/20 7:46 AM

## 2020-04-19 NOTE — Evaluation (Signed)
Clinical/Bedside Swallow Evaluation Patient Details  Name: Maria [name] MRN: [id number] Date of Birth: [birth year]  Today's Date: 04/19/2020 Time: SLP Start Time (ACUTE ONLY): 7673 SLP Stop Time (ACUTE ONLY): 0937 SLP Time Calculation (min) (ACUTE ONLY): 15 min  Past Medical History:  Past Medical History:  Diagnosis Date  . Constipation   . Dysphagia   . Fracture    R foot  . GERD (gastroesophageal reflux disease)   . History of shingles 10/2016  . Mental retardation    lesion in head  . Seizures ([place])    "not fully controlled on max doses of meds" (Neuro ofc note 12/2014)  . Thrombocytopenia ([place])   . Tremor    Past Surgical History:  Past Surgical History:  Procedure Laterality Date  . BIOPSY  09/16/2014   Procedure: BIOPSY;  Surgeon: Rogene Houston, MD;  Location: [facility];  Service: Endoscopy;;  . CATARACT EXTRACTION     both eyes, May of 2015  . COLONOSCOPY    . COLONOSCOPY WITH PROPOFOL N/A 11/20/2018   Procedure: COLONOSCOPY WITH PROPOFOL;  Surgeon: Rogene Houston, MD;  Location: [facility];  Service: Endoscopy;  Laterality: N/A;  . ESOPHAGEAL DILATION N/A 09/16/2014   Procedure: ESOPHAGEAL DILATION WITH 54FR MALONEY DILATOR;  Surgeon: Rogene Houston, MD;  Location: [facility];  Service: Endoscopy;  Laterality: N/A;  . ESOPHAGOGASTRODUODENOSCOPY (EGD) WITH PROPOFOL N/A 09/16/2014   Procedure: ESOPHAGOGASTRODUODENOSCOPY (EGD) WITH PROPOFOL;  Surgeon: Rogene Houston, MD;  Location: [facility];  Service: Endoscopy;  Laterality: N/A;  . MOUTH SURGERY    . POLYPECTOMY  11/20/2018   Procedure: POLYPECTOMY;  Surgeon: Rogene Houston, MD;  Location: [facility];  Service: Endoscopy;;  colon  . Skin graft to gum Right 08/2013  . TONSILLECTOMY AND ADENOIDECTOMY    . TOTAL ABDOMINAL HYSTERECTOMY     HPI:  59 yo admitted with sepsis, pyelonephritis and encephalopathy. PMhx: epilepsy, intellectual disability, GERD. History dysphagia listed in chart but no prior ST documentation  found.   Assessment / Plan / Recommendation Clinical Impression  Pt seen with sister and nurse at bedside. She was unable to awake with max tactile and verbal stimulation (Haldol this am) and no po's able to be given. Therapist noted severe xerostomia and oral debridement removed large layer of dried mucous on hard palate and from lingual surface. RN encouraged to continue frequent oral care. Educated sister on treatment plan and goals going forward.    SLP Visit Diagnosis: Dysphagia, unspecified (R13.10)    Aspiration Risk  Other (comment) (TBD)    Diet Recommendation NPO   Medication Administration: Via alternative means    Other  Recommendations Oral Care Recommendations: Oral care QID   Follow up Recommendations  (TBD)      Frequency and Duration min 2x/week  2 weeks       Prognosis Prognosis for Safe Diet Advancement: Good Barriers to Reach Goals: Cognitive deficits      Swallow Study   General HPI: 59 yo admitted with sepsis, pyelonephritis and encephalopathy. PMhx: epilepsy, intellectual disability, GERD. History dysphagia listed in chart but no prior ST documentation found. Type of Study: Bedside Swallow Evaluation Previous Swallow Assessment:  (none) Diet Prior to this Study: NPO;NG Tube Temperature Spikes Noted: No Respiratory Status: Other (comment) (HFNC 15L) History of Recent Intubation: No Behavior/Cognition: Lethargic/Drowsy;Doesn't follow directions Oral Cavity Assessment: Dry;Dried secretions Oral Care Completed by SLP: Yes Oral Cavity - Dentition:  (to be assessed further ) Patient Positioning: Partially reclined  Baseline Vocal Quality:  (no vocalizations)    Oral/Motor/Sensory Function Overall Oral Motor/Sensory Function: Other (comment) (UTA- mouth breathing, lethargic)   Ice Chips Ice chips: Not tested   Thin Liquid Thin Liquid: Not tested    Nectar Thick Nectar Thick Liquid: Not tested   Honey Thick Honey Thick Liquid: Not tested   Puree Puree:  Not tested   Solid     Solid: Not tested      Houston Siren 04/19/2020,3:45 PM  Orbie Pyo Colvin Caroli.Ed Risk analyst [phone] Office [phone]

## 2020-04-19 NOTE — Evaluation (Signed)
Physical Therapy Evaluation Patient Details Name: Maria [name] MRN: [id number] DOB: April 14, 1961 Today's Date: 04/19/2020   History of Present Illness  59 yo admitted with sepsis, pyelonephritis and encephalopathy. PMhx: epilepsy, intellectual disability, GERD  Clinical Impression  Pt with eyes closed, restless on arrival moving covers and lines with bil mittens. Pt able to transition to sitting with multimodal cueing and assist with significant guarding/blocking to prevent pt from standing as no awareness of situation, place or safety. Pt fatigued after 5 min with return to supine. Sister present to provide home setup and PLOF. Pt normally is very independent and enjoys shopping for jewelry. If pt cognition able to improve to follow commands CIR would be appropriate but until then SNF remains best option as pt mom is the primary caregiver. Pt with decreased cognition, mobility, function, cardiopulmonary status and safety who will benefit from acute therapy to maximize mobility, independence and safety to decrease burden of care.   Pt on 15L via HFNC with SpO2 95-99% HR 86    Follow Up Recommendations Supervision/Assistance - 24 hour;SNF    Equipment Recommendations  Other (comment) (TBD)    Recommendations for Other Services OT consult;Speech consult     Precautions / Restrictions Precautions Precautions: Fall Precaution Comments: cortrak      Mobility  Bed Mobility Overal bed mobility: Needs Assistance Bed Mobility: Supine to Sit;Sit to Supine     Supine to sit: Min assist Sit to supine: Min assist   General bed mobility comments: pt able to transition from supine to sitting with multimodal cues and guarding/blocking knee to prevent standing. Pt able to scoot to EOB and sit with continual bil UE and LE movement requiring min-mod assist to maintain balance and block knee to prevent standing. Pt returned to supine min assist to fully clear legs and total +2 to slide toward HOB.  Pt unable to follow commands to progress mobility further. Positioned in partial chair end of session    Transfers                 General transfer comment: unsafe to attempt  Ambulation/Gait                Stairs            Wheelchair Mobility    Modified Rankin (Stroke Patients Only)       Balance Overall balance assessment: Needs assistance Sitting-balance support: Bilateral upper extremity supported;No upper extremity supported;Feet supported Sitting balance-Leahy Scale: Fair Sitting balance - Comments: EOB grossly 5 min with restless movement and assist for safety                                     Pertinent Vitals/Pain Pain Assessment: No/denies pain    Home Living Family/patient expects to be discharged to:: Private residence Living Arrangements: Parent Available Help at Discharge: Family;Available 24 hours/day Type of Home: House Home Access: Stairs to enter   [employer] of Steps: 1 Home Layout: Two level;Laundry or work area in basement;Able to live on main level with bedroom/bathroom Home Equipment: Environmental consultant - 2 wheels;Shower seat;Bedside commode;Wheelchair - manual      Prior Function Level of Independence: Needs assistance   Gait / Transfers Assistance Needed: walks without RW  ADL's / Homemaking Assistance Needed: performs ADLs on her own  Comments: supervision for safety, cognition, medication     Hand Dominance  Extremity/Trunk Assessment   Upper Extremity Assessment Upper Extremity Assessment: Overall WFL for tasks assessed    Lower Extremity Assessment Lower Extremity Assessment: Overall WFL for tasks assessed    Cervical / Trunk Assessment Cervical / Trunk Assessment:  (rounded shoulders EOB, pt with good strength all extremities and moving then all even with resistance)  Communication   Communication: Expressive difficulties  Cognition Arousal/Alertness: Lethargic Behavior During  Therapy: Flat affect;Restless Overall Cognitive Status: Impaired/Different from baseline Area of Impairment: Orientation;Attention;Following commands                   Current Attention Level: Focused   Following Commands: Follows one step commands inconsistently;Follows one step commands with increased time       General Comments: pt restless, impulsive and only responding to 10% of questions. Pt able to follow multimodal cues to get to EOb but then very restless and returned self to supine      General Comments      Exercises General Exercises - Upper Extremity Elbow Flexion: AROM;Seated;10 reps;Both (with and without resistance) Elbow Extension: AROM;10 reps;Both;Seated (with and without resistance)   Assessment/Plan    PT Assessment Patient needs continued PT services  PT Problem List Decreased mobility;Decreased safety awareness;Decreased activity tolerance;Decreased cognition;Decreased balance;Decreased knowledge of use of DME;Cardiopulmonary status limiting activity       PT Treatment Interventions Gait training;Balance training;Functional mobility training;Cognitive remediation;Therapeutic activities;Patient/family education;Neuromuscular re-education    PT Goals (Current goals can be found in the Care Plan section)  Acute Rehab PT Goals Patient Stated Goal: return to walking and home PT Goal Formulation: With family Time For Goal Achievement: 05/03/20 Potential to Achieve Goals: Fair    Frequency Min 3X/week   Barriers to discharge Decreased caregiver support      Co-evaluation               AM-PAC PT "6 Clicks" Mobility  Outcome Measure Help needed turning from your back to your side while in a flat bed without using bedrails?: A Lot Help needed moving from lying on your back to sitting on the side of a flat bed without using bedrails?: A Lot Help needed moving to and from a bed to a chair (including a wheelchair)?: Total Help needed standing  up from a chair using your arms (e.g., wheelchair or bedside chair)?: Total Help needed to walk in hospital room?: Total Help needed climbing 3-5 steps with a railing? : Total 6 Click Score: 8    End of Session   Activity Tolerance: Patient tolerated treatment well Patient left: in bed;with bed alarm set;with family/visitor present;with nursing/sitter in room Nurse Communication: Mobility status;Precautions PT Visit Diagnosis: Other abnormalities of gait and mobility (R26.89)    Time: 2993-7169 PT Time Calculation (min) (ACUTE ONLY): 19 min   Charges:   PT Evaluation $PT Eval Moderate Complexity: 1 Mod          Jaida Basurto P, PT Acute Rehabilitation Services Pager: [phone] Office: [place] 04/19/2020, 10:42 AM

## 2020-04-20 ENCOUNTER — Inpatient Hospital Stay (HOSPITAL_COMMUNITY): Payer: Medicare Other

## 2020-04-20 DIAGNOSIS — R569 Unspecified convulsions: Secondary | ICD-10-CM

## 2020-04-20 DIAGNOSIS — N179 Acute kidney failure, unspecified: Secondary | ICD-10-CM | POA: Diagnosis not present

## 2020-04-20 DIAGNOSIS — N12 Tubulo-interstitial nephritis, not specified as acute or chronic: Secondary | ICD-10-CM | POA: Diagnosis not present

## 2020-04-20 LAB — CBC WITH DIFFERENTIAL/PLATELET
Abs Immature Granulocytes: 0.3 10*3/uL — ABNORMAL HIGH (ref 0.00–0.07)
Basophils Absolute: 0.1 10*3/uL (ref 0.0–0.1)
Basophils Relative: 1 %
Eosinophils Absolute: 0 10*3/uL (ref 0.0–0.5)
Eosinophils Relative: 0 %
HCT: 29.1 % — ABNORMAL LOW (ref 36.0–46.0)
Hemoglobin: 9.2 g/dL — ABNORMAL LOW (ref 12.0–15.0)
Lymphocytes Relative: 16 %
Lymphs Abs: 0.9 10*3/uL (ref 0.7–4.0)
MCH: 31.8 pg (ref 26.0–34.0)
MCHC: 31.6 g/dL (ref 30.0–36.0)
MCV: 100.7 fL — ABNORMAL HIGH (ref 80.0–100.0)
Metamyelocytes Relative: 1 %
Monocytes Absolute: 0.3 10*3/uL (ref 0.1–1.0)
Monocytes Relative: 6 %
Myelocytes: 3 %
Neutro Abs: 4.1 10*3/uL (ref 1.7–7.7)
Neutrophils Relative %: 72 %
Platelets: 200 10*3/uL (ref 150–400)
Promyelocytes Relative: 1 %
RBC: 2.89 MIL/uL — ABNORMAL LOW (ref 3.87–5.11)
RDW: 14.8 % (ref 11.5–15.5)
WBC: 5.7 10*3/uL (ref 4.0–10.5)
nRBC: 1.2 % — ABNORMAL HIGH (ref 0.0–0.2)
nRBC: 4 /100 WBC — ABNORMAL HIGH

## 2020-04-20 LAB — BASIC METABOLIC PANEL
Anion gap: 10 (ref 5–15)
Anion gap: 9 (ref 5–15)
BUN: 16 mg/dL (ref 6–20)
BUN: 15 mg/dL (ref 6–20)
CO2: 28 mmol/L (ref 22–32)
CO2: 32 mmol/L (ref 22–32)
Calcium: 8.7 mg/dL — ABNORMAL LOW (ref 8.9–10.3)
Calcium: 9 mg/dL (ref 8.9–10.3)
Chloride: 107 mmol/L (ref 98–111)
Chloride: 104 mmol/L (ref 98–111)
Creatinine, Ser: 0.51 mg/dL (ref 0.44–1.00)
Creatinine, Ser: 0.61 mg/dL (ref 0.44–1.00)
GFR, Estimated: >60 mL/min (ref >60)
GFR, Estimated: >60 mL/min (ref >60)
Glucose, Bld: 151 mg/dL — ABNORMAL HIGH (ref 70–99)
Glucose, Bld: 190 mg/dL — ABNORMAL HIGH (ref 70–99)
Potassium: 3.7 mmol/L (ref 3.5–5.1)
Potassium: 3.6 mmol/L (ref 3.5–5.1)
Sodium: 145 mmol/L (ref 135–145)
Sodium: 145 mmol/L (ref 135–145)

## 2020-04-20 LAB — GLUCOSE, CAPILLARY
Glucose-Capillary: 166 mg/dL — ABNORMAL HIGH (ref 70–99)
Glucose-Capillary: 148 mg/dL — ABNORMAL HIGH (ref 70–99)
Glucose-Capillary: 126 mg/dL — ABNORMAL HIGH (ref 70–99)
Glucose-Capillary: 159 mg/dL — ABNORMAL HIGH (ref 70–99)
Glucose-Capillary: 125 mg/dL — ABNORMAL HIGH (ref 70–99)
Glucose-Capillary: 123 mg/dL — ABNORMAL HIGH (ref 70–99)

## 2020-04-20 MED ORDER — LORAZEPAM 2 MG/ML IJ SOLN: 2.0000 mg | INTRAMUSCULAR | Status: AC

## 2020-04-20 MED ORDER — LACTULOSE 10 GM/15ML PO SOLN
20.0000 g | Freq: Three times a day (TID) | ORAL | Status: DC
  Administered 2020-04-20 (×3): 20 g
  Filled 2020-04-20 (×3): qty 30

## 2020-04-20 MED ORDER — METOLAZONE 2.5 MG PO TABS
5.0000 mg | ORAL_TABLET | Freq: Once | ORAL | Status: AC
  Administered 2020-04-20: 5 mg via ORAL
  Filled 2020-04-20: qty 2

## 2020-04-20 MED ORDER — LORAZEPAM 2 MG/ML IJ SOLN
INTRAMUSCULAR | Status: AC
  Filled 2020-04-20: qty 1

## 2020-04-20 MED ORDER — LORAZEPAM 2 MG/ML IJ SOLN
2.0000 mg | INTRAMUSCULAR | Status: DC | PRN
  Administered 2020-04-24: 2 mg via INTRAVENOUS
  Filled 2020-04-20: qty 1

## 2020-04-20 MED ORDER — SODIUM CHLORIDE 0.9 % IV SOLN
2000.0000 mg | INTRAVENOUS | Status: AC
  Administered 2020-04-20: 2000 mg via INTRAVENOUS
  Filled 2020-04-20: qty 20

## 2020-04-20 MED ORDER — CLONAZEPAM 0.5 MG PO TBDP
0.5000 mg | ORAL_TABLET | Freq: Three times a day (TID) | ORAL | Status: DC
  Administered 2020-04-20 – 2020-04-21 (×3): 0.5 mg
  Filled 2020-04-20 (×3): qty 1

## 2020-04-20 NOTE — TOC Initial Note (Signed)
Transition of Care [facility]) - Initial/Assessment Note    Patient Details  Name: Maria [name] MRN: [id number] Date of Birth: 08-Nov-1960  [facility] CM/SW Contact:    Verdell Carmine, RN Phone Number: 04/20/2020, 4:34 PM  Clinical Narrative:                 Admitted day 6 admitted intially for kidney infection on antibiotics. Patient has history of seizures, had several refractory to medications. Neurology doing 24 hour EEG. Patient on ativan for breakthrough.  May need home health post hospitalization. Look at [facility] with Palliative team. Ammonia level elevated as well. CM will follow for needs  Expected Discharge Plan: [place] Barriers to Discharge: Continued Medical Work up   Patient Goals and CMS Choice        Expected Discharge Plan and Services Expected Discharge Plan: [place] In-house Referral: Clinical Social Work Discharge Planning Services: CM Consult   Living arrangements for the past 2 months: [place]                                      Prior Living Arrangements/Services Living arrangements for the past 2 months: Single Family Home Lives with:: Parents Patient language and need for interpreter reviewed:: Yes        Need for Family Participation in Patient Care: Yes (Comment) Care giver support system in place?: Yes (comment)   Criminal Activity/Legal Involvement Pertinent to Current Situation/Hospitalization: No - Comment as needed  Activities of Daily Living Home Assistive Devices/Equipment: None ADL Screening (condition at time of admission) Patient's cognitive ability adequate to safely complete daily activities?: Yes Is the patient deaf or have difficulty hearing?: No Does the patient have difficulty seeing, even when wearing glasses/contacts?: No Does the patient have difficulty concentrating, remembering, or making decisions?: Yes Patient able to express need for assistance  with ADLs?: Yes Does the patient have difficulty dressing or bathing?: No Independently performs ADLs?: Yes (appropriate for developmental age) Does the patient have difficulty walking or climbing stairs?: No Weakness of Legs: None Weakness of Arms/Hands: None  Permission Sought/Granted                  Emotional Assessment     Affect (typically observed): Unable to Assess Orientation: :  (currently coatose) Alcohol / Substance Use: Not Applicable Psych Involvement: No (comment)  Admission diagnosis:  Renal abscess [N15.1] Pyelonephritis [N12] Renal abscess, left [N15.1] Pyelonephritis of left kidney [N12] Patient Active Problem List   Diagnosis Date Noted  . Palliative care by specialist   . Pyelonephritis of left kidney 04/14/2020  . Renal abscess 04/14/2020  . Leukocytosis 04/14/2020  . Fever 04/14/2020  . LLQ abdominal pain 04/14/2020  . Dysphagia   . AKI (acute kidney injury) ([place])   . Sepsis secondary to UTI ([place])   . Pain in left ankle and joints of left foot 12/02/2019  . Positive colorectal cancer screening using Cologuard test 10/28/2018  . Posterior tibial tendinitis, right leg 04/23/2016  . Pain in right foot 04/12/2016  . Acute encephalopathy   . Myoclonus   . Frequent falls 11/09/2015  . Fall 11/09/2015  . Chest pain 03/16/2014  . Upper abdominal pain 03/16/2014  . Seizure disorder ([place]) 03/16/2014  . 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 Generalized convulsive epilepsy ([place]) 10/06/2012  . DNR (do not  resuscitate) discussion 10/06/2012  . Intellectual disability   . Thrombocytopenia ([place]) 05/23/2011  . Seizures ([place]) 03/21/2011  . GERD (gastroesophageal reflux disease) 03/21/2011  . CLOSED FRACTURE OF ACROMIAL END OF CLAVICLE 07/13/2008   PCP:  Celene Squibb, MD Pharmacy:   [place], [place] [address] Phone: [phone] Fax: [place], [place] [address] [place] [address] Phone: [phone] Fax: [phone]     Social Determinants of Health (SDOH) Interventions    Readmission Risk Interventions No flowsheet data found.

## 2020-04-20 NOTE — Progress Notes (Signed)
Patient started CPT at 1530 and it is scheduled to end at 1540. RT making note due to patient being on continuous EEG at this time.

## 2020-04-20 NOTE — Progress Notes (Signed)
OT Cancellation Note  Patient Details Name: STANLEY HELMUTH MRN: [id number] DOB: 03/06/1961   Cancelled Treatment:    Reason Eval/Treat Not Completed: Medical issues which prohibited therapy (pt on continuous EEG due to seizure). Will follow and see as able.   Jolaine Artist, OT Acute Rehabilitation Services Pager [phone] Office [phone]   Delight Stare 04/20/2020, 12:35 PM

## 2020-04-20 NOTE — Progress Notes (Signed)
EEG complete - results pending 

## 2020-04-20 NOTE — Progress Notes (Addendum)
04/20/2020  I saw and evaluated the patient. Discussed with resident and agree with resident's findings and plan as documented in the resident's note.  I have seen and evaluated the patient for encephalopathy follow up.  S:  Looked a bit better this am, able to answer simple questions. Then there was question of seizure-like activity brought up by sister.  O: Blood pressure 111/70, pulse 62, temperature (!) 97.5 F (36.4 C), temperature source Axillary, resp. rate 18, height 5\' 6"  (1.676 m), weight 70.2 kg, SpO2 96 %.  No acute distress Pupils equal, tracking. MMM Heart sounds regular, ext warm  Still gurgling respirations occasionally c/w retained secretions Moves all 4 ext to command  A:  -Acute metabolic encephalopathy- agitated/septic delirium +/- HE on top of baseline intellectual disability and anxiety disorder. -Seizure-like activity 11/18 AM per sister and RN, has hx of seizures -E coli pyelonephritis -Acute hypoxemic respiratory failure due to aspiration pneumonia of both lungs -Acute kidney injury resolved - Hypernatremia, mild volume overload  P:  - Ceftriaxone x 14 days - Continue depakote, vimpat, clonazepam, BID seroquel - Continue lactulose, increase - Check AM EKG - Check EEG, if okay start weaning precedex - Continue aggressive pulmonary toileting measures as ordered - Dose of metolazone - TF  Patient critically ill due to acute metabolic encephalopathy, acute hypoxemic respiratory failure, question of siezures Interventions to address this today titration of precedex, lactulose, EEG Risk of deterioration without these interventions is high  I personally spent 36 minutes providing critical care not including any separately billable procedures  Erskine Emery MD [facility] 04/20/2020 10:06 AM Personal pager: [phone] If unanswered, please page CCM On-call: [phone]       NAME:  Maria [name], MRN:  [id number], DOB:   12-28-1960, LOS: 6 ADMISSION DATE:  04/14/2020, CONSULTATION DATE:  04/16/20 REFERRING MD:  Forestine Na, Dr. Wynetta Emery, CHIEF COMPLAINT:  sepsis   Brief History   59 year old woman with hx of epilepsy, intellectual disability, GERD, here with sepsis from pyelonephritis.   Past Medical History  Intellectual disability Epilepsy Chronic constipation Dysphagia  Significant Hospital Events   11/12 admitted w/ septic shock and acute TME 11/15 PICC and CorTrak placed.  11/16 off pressors. Still agitated and delirious. On mono rx for ecoli  Consults:   Procedures:  RUE PICC 11/15  Significant Diagnostic Tests:  CT abdomen:  Findings suspicious for pyelonephritis and small abscess in lower pole of left kidney. Suggest correlation with urinalysis, and recommend continued follow-up by CT to confirm resolution. No evidence of ureteral calculi or hydronephrosis.  Micro Data:  Urine cutlure:  (pansens) Ecoli  Blood culture: Negative  Antimicrobials:  Ceftriaxone 2g 11/12 --> Azithro 11/14 --11/15 Zosyn 11/14 --> 11/15  Interim history/subjective:  No acute overnight. Able to get up to chair with PT yesterday. This morning she is sleepy intermittently moaning in bed. Sister is at bedside notes patient with continued cough.   Objective   Blood pressure 111/70, pulse 62, temperature (!) 97.5 F (36.4 C), temperature source Axillary, resp. rate 18, height 5\' 6"  (1.676 m), weight 70.2 kg, SpO2 96 %.    FiO2 (%):  [100 %] 100 %   Intake/Output Summary (Last 24 hours) at 04/20/2020 0758 Last data filed at 04/20/2020 0528 Gross per 24 hour  Intake 2013.65 ml  Output 4050 ml  Net -2036.35 ml   Filed Weights   04/18/20 0439 04/19/20 0359 04/20/20 0324  Weight: 69.5 kg 69.5 kg 70.2 kg  Examination: General: caucasian female, agitated and moaning in the bed. No acute distress  HENT NCAT no JVD  Pulm scattered rhonchi greater on the right no accessory use  Card: RRR, no  murmurs abd: soft not tender, no masses Derm diffuse petechial rash no edema  Neuro: Sleepy this morning responds to name, not following commands GU: large amount of urine output  Resolved Hospital Problem list     Assessment & Plan:  Septic shock secondary to E Coli Pyelonephritis, aspiration pneumonitis, question of small associated abscess Continue hypertonic saline nebulizer CPT Abx day # 7/14 (On ceftriaxone)  Acute metabolic encephalopathy vs. ICU delirium complicated by baseline intellectual disability & Hx of epilepsy -She is on multiple AEDs: Depakote ER, Vimpat, Clonazepam at home - Still confused and intermittently agitated, only small amount of stool output yesterday. Plan Increase lactulose 20 g TID Cont depakote, Vimpat, Clonazepam, VT, and seroquel Treat infection Wean precedex Maintain day night cycles Encourage family support  Fluid and Electrolyte imbalance:  Hypernatremia, hyperchloremia, mild Azotemia Sodium 145 today, hyperchloremia and azotemia resolved Plan Continue free water  Monitor electrolytes  Strict I&O  Hypothermia Temperature dow on 95.9 overnight patient restless kicking off sheets.  -Continue external warming  Petechial rash  -Per sister this is a chronic issue -Thrombocytopenia resolved  Anorexia  cortrak placed tolerating tube feeds at goal rate Plan Continue tube feeds  Best practice:  Diet: Tube feeds Pain/Anxiety/Delirium protocol (if indicated): wean precedex VAP protocol (if indicated): N/A DVT prophylaxis: lovenox 40mg  daily GI prophylaxis: Protonix  Glucose control: ISS Mobility: PT  Code Status: Full  Family Communication: Discussed with sister at bedside Disposition: ICU  Iona Beard Internal Medicine PGY-1  04/20/20 7:58 AM

## 2020-04-20 NOTE — Procedures (Addendum)
Patient Name: Maria [name]  MRN: [id number]  Epilepsy Attending: Lora Havens  Referring Physician/Provider: Dr Ina Homes Date: 04/20/2020 Duration: 23.40 mins  Patient history: 59yo F with h/o epilepsy now with ams. EEG to evaluate for seizure  Level of alertness: Lethargic  AEDs during EEG study: LCM, VPA, Clonopin  Technical aspects: This EEG study was done with scalp electrodes positioned according to the 10-20 International system of electrode placement. Electrical activity was acquired at a sampling rate of 500Hz  and reviewed with a high frequency filter of 70Hz  and a low frequency filter of 1Hz . EEG data were recorded continuously and digitally stored.   Description: EEG showed continuous generalized 3 to 6 Hz theta-delta slowing. Patient was noted to have two episodes at 1042 and 1045 of sudden arm and leg elevation followed by jerking briefly for 2-3 seconds. Concomitant eeg showed generalized polyspikes lasting 10-12 seconds. Generalized spike and wave discharges were also noted, at times lasting 5-7seconds consistent with brief-ictal-interictal rhythmic discharges ( BIRDS). Hyperventilation and photic stimulation were not performed.     ABNORMALITY - Seizure, generalized - BIRDs - Spike and wave, generalized - Continuous slow, generalized  IMPRESSION: This study showed two seizures at 1042 and 1045 during which patient was noted to have sudden arm and leg elevation followed by jerking for 2 to 3 seconds with generalized onset.  Additionally there is evidence of severe diffuse encephalopathy, likely secondary to seizures.  Dr. Ina Homes was immediately notified.  Eidan Muellner [name]

## 2020-04-20 NOTE — Plan of Care (Signed)
Notified of sz by Dr. Hinda Glatter with [facility] per recs from her. Will review EEG later again.  -- Amie Portland, MD [facility] Pager: [phone] If 7pm to 7am, please call on call as listed on AMION.

## 2020-04-20 NOTE — Consult Note (Signed)
Neurology Consultation Reason for Consult: Seizure Referring Physician: Dr. Ina Homes  CC: Altered mental status  History is obtained from: Chart review as patient is unable to provide history radiculopathy  HPI: Maria [name] is a 59 y.o. female with history of epilepsy (on Depakote,  Vimpat and Klonopin), intellectual disability who presented on 04/14/2020 with left lower quadrant pain for 3 weeks.  She was diagnosed with sepsis from pyelonephritis and started on ceftriaxone,day 7/14.  She was noted to be confused and had seizure-like activity.  Therefore EEG was ordered which showed 2 seizures.  Neurology was consulted for further management.  ROS:  Unable to obtain due to altered mental status.   Past Medical History:  Diagnosis Date  . Constipation   . Dysphagia   . Fracture    R foot  . GERD (gastroesophageal reflux disease)   . History of shingles 10/2016  . Mental retardation    lesion in head  . Seizures ([place])    "not fully controlled on max doses of meds" (Neuro ofc note 12/2014)  . Thrombocytopenia ([place])   . Tremor     Family History  Problem Relation Age of Onset  . High Cholesterol Mother   . High blood pressure Mother   . Diabetes Father     Social History: Per chart review,she quit smoking about 13 years ago. Her smoking use included cigarettes. She has a 22.50 pack-year smoking history. She has never used smokeless tobacco. She reports that she does not drink alcohol and does not use drugs.  Exam: Current vital signs: BP 116/61   Pulse 68   Temp (!) 97.5 F (36.4 C) (Axillary)   Resp (!) 23   Ht 5\' 6"  (1.676 m)   Wt 70.2 kg   SpO2 95%   BMI 24.98 kg/m  Vital signs in last 24 hours: Temp:  [95.9 F (35.5 C)-97.5 F (36.4 C)] 97.5 F (36.4 C) (11/18 1114) Pulse Rate:  [56-99] 68 (11/18 1000) Resp:  [15-39] 23 (11/18 1000) BP: (110-156)/(60-112) 116/61 (11/18 1000) SpO2:  [91 %-99 %] 95 % (11/18 1000) FiO2 (%):  [100 %] 100 % (11/17  1600) Weight:  [70.2 kg] 70.2 kg (11/18 0324)   Physical Exam  Constitutional: Appears well-developed and well-nourished.  Psych: Unable to assess due to encephalopathy Eyes: No scleral injection HENT: No OP obstrucion Head: Normocephalic.  Cardiovascular: Normal rate and regular rhythm.  Respiratory: Intubated, non-labored breathing GI: Soft.  No distension Skin: Warm Neuro: Comatose, does not open eyes to noxious stimuli, pupils are clear reactive, no forced gaze deviation, no apparent facial asymmetry, withdraws to noxious stimuli in all 4 extremities   I have reviewed labs in epic and the results pertinent to this consultation are: WBC 5.7, hemoglobin 9.2, platelets 200, normal sodium, valproic acid level on 04/18/2020 50, ammonia 76.  I have reviewed the images obtained: CT head without contrast on 11/08/2015: Atrophy, greatest in the cerebellar region. Encephalomalacia in the inferomedial right frontal lobe. No intracranial mass, hemorrhage, extra-axial fluid collection, or acute infarct. There is a right frontal scalp hematoma. Mild chronic mastoid disease bilaterally is stable. There is probable cerumen in the right external auditory canal.  ASSESSMENT/PLAN: 59 year old female with history of intellectual disability, pharmaco-resistant epilepsy who was admitted for abdominal pain as was found to have sepsis with pyelonephritis.  While in the hospital she was noted to have seizures.  Pharmaco- resistant epilepsy with seizures Sepsis with pyelonephritis Hyperammonemia Microcytic anemia -EEG showed 2 seizures with generalized  onset.   Recommendations: -Informed RN to give IV Ativan 2 mg stat, will start video EG monitoring to adjust AEDs -Even though valproic acid level is 50, ammonia is already elevated at 76.  Therefore cannot increase it any further.  Already on maximum dose of Vimpat -Will increase clonazepam 0.5 mg 3 times daily, can be increased to 1 mg if  needed. -Seizure precautions -PRN IV Ativan 2 mg for clinical etiology more than 2 minutes or multiple seizures without return to baseline -Management of rest of comorbidities per primary team   CRITICAL CARE Performed by: Lora Havens   Total critical care time: 45 minutes  Critical care time was exclusive of separately billable procedures and treating other patients.  Critical care was necessary to treat or prevent imminent or life-threatening deterioration.  Critical care was time spent personally by me on the following activities: development of treatment plan with patient and/or surrogate as well as nursing, discussions with consultants, evaluation of patient's response to treatment, examination of patient, obtaining history from patient or surrogate, ordering and performing treatments and interventions, ordering and review of laboratory studies, ordering and review of radiographic studies, pulse oximetry and re-evaluation of patient's condition.      Zeb Comfort Epilepsy [facility]

## 2020-04-20 NOTE — Progress Notes (Signed)
LTM EEG hooked up and running - no initial skin breakdown - push button tested - neuro notified.  

## 2020-04-20 NOTE — Progress Notes (Signed)
Nutrition Follow-up  DOCUMENTATION CODES:   Not applicable  INTERVENTION:   Continue tube feeds via Cortrak: - Osmolite 1.5 @ 50 ml/hr (1200 ml/day) - ProSource TF 45 ml BID  Tube feeding regimen provides 1880 kcal, 97 grams of protein, and 914 ml of H2O.  NUTRITION DIAGNOSIS:   Inadequate oral intake related to lethargy/confusion as evidenced by NPO status.  Ongoing  GOAL:   Patient will meet greater than or equal to 90% of their needs  Met via TF  MONITOR:   Diet advancement, Labs, Weight trends, TF tolerance, I & O's  REASON FOR ASSESSMENT:   Consult Enteral/tube feeding initiation and management  ASSESSMENT:   59 year old female who presented to the ED on 11/12 with abdominal pain. PMH of intellectual disability, chronic constipation, epilepsy, intellectual disability, GERD, dysphagia, tremors. Pt admitted with sepsis secondary to UTI/pyelonephritis and left renal abscess.  11/15 - Cortrak placed, tip gastric  Discussed pt with RN and during ICU rounds. Pt now having seizure activity.  Weight up 3 kg since admission. Suspect weight gain related to positive fluid balance.  Pt tolerating current TF without issue. Will continue with current regimen.  Current TF: Osmolite 1.5 @ 50 ml/hr, ProSource TF 45 ml BID, free water flushes 200 ml q 3 hours while awake  Medications reviewed and include: SSI q 4 hours, lactulose TID, MVI with minerals, protonix, IV abx, precedex  Labs reviewed: hemoglobin 9.2 CBG's: 118-166 x 24 hours  UOP: 4050 ml x 24 hours I/O's: +9.4 L since admit  Diet Order:   Diet Order            Diet NPO time specified Except for: Ice Chips  Diet effective now                 EDUCATION NEEDS:   No education needs have been identified at this time  Skin:  Skin Assessment: Reviewed RN Assessment  Last BM:  04/20/20 small type 3  Height:   Ht Readings from Last 1 Encounters:  04/14/20 _0  (1.676 m)    Weight:   Wt  Readings from Last 1 Encounters:  04/20/20 70.2 kg    Ideal Body Weight:  59.1 kg  BMI:  Body mass index is 24.98 kg/m.  Estimated Nutritional Needs:   Kcal:  1800-2000  Protein:  85-100 grams  Fluid:  1.8-2.0 L    Gustavus Bryant, MS, RD, LDN Inpatient Clinical Dietitian Please see AMiON for contact information.

## 2020-04-20 NOTE — Progress Notes (Signed)
CPT being held at this time due to patient recently having seizure like activity.

## 2020-04-20 NOTE — Progress Notes (Signed)
CPT held at this time due to EEG recording.

## 2020-04-20 NOTE — Progress Notes (Signed)
SLP Cancellation Note  Patient Details Name: Maria [name] MRN: [id number] DOB: [birth year]   Cancelled treatment:        Checked on patient this morning- continues on Precedex and not appropriate for swallow eval. At this time therapist will sign off.  If/when improves, please reconsult.    Houston Siren 04/20/2020, 3:37 PM  Orbie Pyo Colvin Caroli.Ed Risk analyst [phone] Office [phone]

## 2020-04-20 NOTE — Progress Notes (Signed)
PT Cancellation Note  Patient Details Name: Maria [name] MRN: [id number] DOB: [birth year]   Cancelled Treatment:    Reason Eval/Treat Not Completed: Patient not medically ready (pt with continuous EEG due to Sz and not currently appropriate)   Hendrix Yurkovich B Crysten Kaman 04/20/2020, 11:53 AM  Bayard Males, PT Acute Rehabilitation Services Pager: [phone] Office: [phone]

## 2020-04-21 ENCOUNTER — Inpatient Hospital Stay (HOSPITAL_COMMUNITY): Payer: Medicare Other

## 2020-04-21 DIAGNOSIS — G40909 Epilepsy, unspecified, not intractable, without status epilepticus: Secondary | ICD-10-CM | POA: Diagnosis not present

## 2020-04-21 DIAGNOSIS — N390 Urinary tract infection, site not specified: Secondary | ICD-10-CM

## 2020-04-21 DIAGNOSIS — G40919 Epilepsy, unspecified, intractable, without status epilepticus: Secondary | ICD-10-CM

## 2020-04-21 DIAGNOSIS — G40309 Generalized idiopathic epilepsy and epileptic syndromes, not intractable, without status epilepticus: Secondary | ICD-10-CM | POA: Diagnosis not present

## 2020-04-21 DIAGNOSIS — J9601 Acute respiratory failure with hypoxia: Secondary | ICD-10-CM

## 2020-04-21 DIAGNOSIS — R569 Unspecified convulsions: Secondary | ICD-10-CM | POA: Diagnosis not present

## 2020-04-21 DIAGNOSIS — A419 Sepsis, unspecified organism: Secondary | ICD-10-CM

## 2020-04-21 DIAGNOSIS — R112 Nausea with vomiting, unspecified: Secondary | ICD-10-CM | POA: Diagnosis not present

## 2020-04-21 DIAGNOSIS — R1310 Dysphagia, unspecified: Secondary | ICD-10-CM | POA: Diagnosis not present

## 2020-04-21 LAB — AMMONIA: Ammonia: 46 umol/L — ABNORMAL HIGH (ref 9–35)

## 2020-04-21 LAB — CBC
HCT: 32.5 % — ABNORMAL LOW (ref 36.0–46.0)
Hemoglobin: 10.4 g/dL — ABNORMAL LOW (ref 12.0–15.0)
MCH: 31.1 pg (ref 26.0–34.0)
MCHC: 32 g/dL (ref 30.0–36.0)
MCV: 97.3 fL (ref 80.0–100.0)
Platelets: 231 10*3/uL (ref 150–400)
RBC: 3.34 MIL/uL — ABNORMAL LOW (ref 3.87–5.11)
RDW: 14.7 % (ref 11.5–15.5)
WBC: 5 10*3/uL (ref 4.0–10.5)
nRBC: 2.2 % — ABNORMAL HIGH (ref 0.0–0.2)

## 2020-04-21 LAB — BASIC METABOLIC PANEL
Anion gap: 13 (ref 5–15)
BUN: 16 mg/dL (ref 6–20)
CO2: 29 mmol/L (ref 22–32)
Calcium: 9 mg/dL (ref 8.9–10.3)
Chloride: 100 mmol/L (ref 98–111)
Creatinine, Ser: 0.65 mg/dL (ref 0.44–1.00)
GFR, Estimated: >60 mL/min (ref >60)
Glucose, Bld: 130 mg/dL — ABNORMAL HIGH (ref 70–99)
Potassium: 3.7 mmol/L (ref 3.5–5.1)
Sodium: 142 mmol/L (ref 135–145)

## 2020-04-21 LAB — GLUCOSE, CAPILLARY
Glucose-Capillary: 113 mg/dL — ABNORMAL HIGH (ref 70–99)
Glucose-Capillary: 129 mg/dL — ABNORMAL HIGH (ref 70–99)
Glucose-Capillary: 112 mg/dL — ABNORMAL HIGH (ref 70–99)
Glucose-Capillary: 93 mg/dL (ref 70–99)
Glucose-Capillary: 114 mg/dL — ABNORMAL HIGH (ref 70–99)
Glucose-Capillary: 114 mg/dL — ABNORMAL HIGH (ref 70–99)

## 2020-04-21 MED ORDER — PANTOPRAZOLE SODIUM 40 MG IV SOLR
40.0000 mg | Freq: Every day | INTRAVENOUS | Status: DC
  Administered 2020-04-21 – 2020-04-24 (×4): 40 mg via INTRAVENOUS
  Filled 2020-04-21 (×4): qty 40

## 2020-04-21 MED ORDER — LACTULOSE ENEMA
300.0000 mL | Freq: Once | ORAL | Status: AC
  Administered 2020-04-21: 150 mL via RECTAL
  Filled 2020-04-21: qty 300

## 2020-04-21 MED ORDER — LACTULOSE 10 GM/15ML PO SOLN
30.0000 g | Freq: Four times a day (QID) | ORAL | Status: DC
  Administered 2020-04-21 (×2): 30 g
  Filled 2020-04-21 (×2): qty 45

## 2020-04-21 MED ORDER — DEXTROSE IN LACTATED RINGERS 5 % IV SOLN: INTRAVENOUS | Status: AC

## 2020-04-21 MED ORDER — VALPROATE SODIUM 500 MG/5ML IV SOLN
500.0000 mg | Freq: Two times a day (BID) | INTRAVENOUS | Status: DC
  Administered 2020-04-21 – 2020-04-23 (×5): 500 mg via INTRAVENOUS
  Filled 2020-04-21 (×7): qty 5

## 2020-04-21 MED ORDER — METOCLOPRAMIDE HCL 5 MG/ML IJ SOLN
10.0000 mg | Freq: Four times a day (QID) | INTRAMUSCULAR | Status: DC
  Administered 2020-04-21 – 2020-04-25 (×15): 10 mg via INTRAVENOUS
  Filled 2020-04-21 (×15): qty 2

## 2020-04-21 MED ORDER — LORAZEPAM 2 MG/ML IJ SOLN
1.0000 mg | Freq: Three times a day (TID) | INTRAMUSCULAR | Status: DC
  Administered 2020-04-21 – 2020-04-23 (×7): 1 mg via INTRAVENOUS
  Filled 2020-04-21 (×7): qty 1

## 2020-04-21 MED ORDER — SODIUM CHLORIDE 0.9 % IV SOLN
200.0000 mg | Freq: Two times a day (BID) | INTRAVENOUS | Status: DC
  Administered 2020-04-21 – 2020-04-23 (×5): 200 mg via INTRAVENOUS
  Filled 2020-04-21 (×8): qty 20

## 2020-04-21 NOTE — Progress Notes (Signed)
NAME:  Maria [name], MRN:  [id number], DOB:  [birth year], LOS: 7 ADMISSION DATE:  04/14/2020, CONSULTATION DATE:  04/16/20 REFERRING MD:  Forestine Na, Dr. Wynetta Emery, CHIEF COMPLAINT:  sepsis   Brief History   59 year old woman with hx of epilepsy, intellectual disability, GERD, here with sepsis from pyelonephritis complicated by delirium and seizures.  Past Medical History  Intellectual disability Epilepsy Chronic constipation Dysphagia  Significant Hospital Events   11/12 admitted w/ septic shock and acute TME 11/15 PICC and CorTrak placed.  11/16 off pressors. Still agitated and delirious. On mono rx for ecoli 11/18 Seizure loaded on keppra Consults:  Neurology  Procedures:  RUE PICC 11/15  Significant Diagnostic Tests:  CT abdomen:  Findings suspicious for pyelonephritis and small abscess in lower pole of left kidney. Suggest correlation with urinalysis, and recommend continued follow-up by CT to confirm resolution. No evidence of ureteral calculi or hydronephrosis.  EEG: - Seizure, generalized - BIRDs - Spike and wave, generalized - Continuous slow, generalized  EKG: NSR Qtc 460 Micro Data:  Urine cutlure:  (pansens) Ecoli  Blood culture: Negative  Antimicrobials:  Ceftriaxone 2g 11/12 --> Azithro 11/14 --11/15 Zosyn 11/14 --> 11/15  Interim history/subjective:  Patient vomited 3 times overnight, now with increased O2 requirements on 15L nonrebreather. Patient is very sedated this morning sister is at bedside.  Objective   Blood pressure (!) 96/54, pulse 72, temperature 97.9 F (36.6 C), temperature source Axillary, resp. rate (!) 24, height 5\' 6"  (1.676 m), weight 70.2 kg, SpO2 96 %.    FiO2 (%):  [100 %] 100 %   Intake/Output Summary (Last 24 hours) at 04/21/2020 0725 Last data filed at 04/21/2020 0600 Gross per 24 hour  Intake 2960.75 ml  Output 3200 ml  Net -239.25 ml   Filed Weights   04/18/20 0439 04/19/20 0359 04/20/20 0324  Weight: 69.5  kg 69.5 kg 70.2 kg    Examination: General: caucasian female, sleeping in bed in no acute distress  HENT NCAT no JVD  Pulm: Continues to have rhonchi throughout,  no accessory muscle  use  Card: RRR, no murmurs abd: Distended, active BS, no masses Derm diffuse petechial rash no edema  Neuro: Sedated this morning due to increased sedation medication  Resolved Hospital Problem list     Assessment & Plan:  Acute hypoxic respiratory failure secondary to aspiration pneumonitis Patient with several episodes of emesis overnight and increase O2 requirements, concerning for acute aspiration. CXR with new left lower lobe consolidation and improvement of right mid lung. Patient is also very sedated today on increased medication for seizure still on precedex may also be contributing. - Monitor O2 status - Wean precedex - Tenuous respiratory status patient quickly desaturating off highflow with increase O2 need, many need to intubate if she does not improve on decreased sedation.  Septic shock secondary to E Coli Pyelonephritis, question of small associated abscess Continues to be a febrile -Ceftriaxone day  7/65  Acute metabolic encephalopathy complicated by baseline intellectual disability & Hx of epilepsy Breakthrough seizures Concerns for seizure like activity yesterday morning. EEG yesterday with 2 generalized seizures and additional seizure around 2100 last night. Currently on continue EEG monitoring. Patient given 2 mg ativan and 2000mg  keppra.  - Neurology consulted, greatly appreciated reccomendations - Continue depakote 500 mg BID, Vimpat 200 mg BID seroquel 50 mg BID - Increase Clonazepam 0.5mg  TID - Ativan 2 mg PRN for seizures  Constipation Patient with multiple episodes of emesis overnight. Started on  lactulose for hyperammonemia with minimal stool output. Abdomen distended on exam today. KUB without obstruction, normal bowel gas pattern. - Lactulose enema   - Lactulose 30 mg  four time daily  Anorexia  cortrak placed tolerating tube feeds at goal rate Plan Hold tube feeds due to vomiting and aspiration  GOC- Discussed with sister at bedside this morning concerning worsening respiratory status and possible need to intubation if she does not improve. Sister would like to see how the patient does with less sedation, and hopes her breathing will improve.  Best practice:  Diet: Hold tube feeds Pain/Anxiety/Delirium protocol (if indicated): wean precedex VAP protocol (if indicated): N/A DVT prophylaxis: lovenox 40mg  daily GI prophylaxis: Protonix  Glucose control: ISS Mobility: PT  Code Status: Full  Family Communication: Discussed with sister at bedside Disposition: ICU  Iona Beard Internal Medicine PGY-1  04/21/20 7:25 AM   Labs   CBC: Recent Labs  Lab 04/15/20 0858 04/15/20 0858 04/16/20 0731 04/16/20 0731 04/17/20 0751 04/17/20 0751 04/18/20 0416 04/18/20 0940 04/19/20 0539 04/20/20 0422 04/21/20 0400  WBC 10.9*   < > 10.8*   < > 6.1  --  7.1  --  8.1 5.7 5.0  NEUTROABS 8.5*  --  8.3*  --   --   --  5.4  --  4.8 4.1  --   HGB 10.3*   < > 10.4*   < > 8.3*  --  9.6*  --  9.6* 9.2* 10.4*  HCT 33.3*   < > 34.3*   < > 26.6*  --  30.8*  --  30.8* 29.1* 32.5*  MCV 101.2*   < > 104.6*   < > 101.1*  --  100.3*  --  100.7* 100.7* 97.3  PLT 115*   < > 138*   < > 121*   < > 146* 131* 181 200 231   < > = values in this interval not displayed.    Basic Metabolic Panel: Recent Labs  Lab 04/16/20 0731 04/17/20 0751 04/17/20 1825 04/18/20 0416 04/18/20 0416 04/18/20 1700 04/19/20 0539 04/20/20 0422 04/20/20 1700 04/21/20 0400  NA 149*   < >  --  149*   < > 151* 149* 145 145 142  K 4.7   < >  --  4.0   < > 3.7 3.5 3.7 3.6 3.7  CL 114*   < >  --  115*   < > 115* 112* 107 104 100  CO2 22   < >  --  24   < > 27 28 28  32 29  GLUCOSE 115*   < >  --  186*   < > 161* 166* 151* 190* 130*  BUN 33*   < >  --  38*   < > 41* 34* 16 15 16    CREATININE 1.06*   < >  --  0.81   < > 0.72 0.72 0.51 0.61 0.65  CALCIUM 8.7*   < >  --  8.8*   < > 8.6* 8.5* 8.7* 9.0 9.0  MG 2.0  --  2.2 2.4  --  2.3 2.2  --   --   --   PHOS  --   --  2.3* 2.0*  --  1.9* 2.9  --   --   --    < > = values in this interval not displayed.   GFR: Estimated Creatinine Clearance: 71.8 mL/min (by C-G formula based on SCr of 0.65 mg/dL). Recent Labs  Lab 04/14/20 0939 04/15/20 0858 04/17/20 0751 04/17/20 0751 04/18/20 0416 04/19/20 0539 04/20/20 0422 04/21/20 0400  PROCALCITON  --   --  0.47  --   --   --   --   --   WBC 15.0*   < > 6.1   < > 7.1 8.1 5.7 5.0  LATICACIDVEN 1.3  --   --   --   --   --   --   --    < > = values in this interval not displayed.    Liver Function Tests: Recent Labs  Lab 04/14/20 0939  AST 13*  ALT 18  ALKPHOS 74  BILITOT 0.6  PROT 7.5  ALBUMIN 3.8   Recent Labs  Lab 04/18/20 0940 04/21/20 0400  AMMONIA 76* 46*    CBG: Recent Labs  Lab 04/20/20 1520 04/20/20 1913 04/20/20 2318 04/21/20 0309 04/21/20 0713  GLUCAP 159* 125* 123* 113* 129*    Past Medical History  She,  has a past medical history of Constipation, Dysphagia, Fracture, GERD (gastroesophageal reflux disease), History of shingles (10/2016), Mental retardation, Seizures ([place]), Thrombocytopenia ([place]), and Tremor.   Surgical History    Past Surgical History:  Procedure Laterality Date  . BIOPSY  09/16/2014   Procedure: BIOPSY;  Surgeon: Rogene Houston, MD;  Location: [facility];  Service: Endoscopy;;  . CATARACT EXTRACTION     both eyes, May of 2015  . COLONOSCOPY    . COLONOSCOPY WITH PROPOFOL N/A 11/20/2018   Procedure: COLONOSCOPY WITH PROPOFOL;  Surgeon: Rogene Houston, MD;  Location: [facility];  Service: Endoscopy;  Laterality: N/A;  . ESOPHAGEAL DILATION N/A 09/16/2014   Procedure: ESOPHAGEAL DILATION WITH 54FR MALONEY DILATOR;  Surgeon: Rogene Houston, MD;  Location: [facility];  Service: Endoscopy;  Laterality: N/A;  .  ESOPHAGOGASTRODUODENOSCOPY (EGD) WITH PROPOFOL N/A 09/16/2014   Procedure: ESOPHAGOGASTRODUODENOSCOPY (EGD) WITH PROPOFOL;  Surgeon: Rogene Houston, MD;  Location: [facility];  Service: Endoscopy;  Laterality: N/A;  . MOUTH SURGERY    . POLYPECTOMY  11/20/2018   Procedure: POLYPECTOMY;  Surgeon: Rogene Houston, MD;  Location: [facility];  Service: Endoscopy;;  colon  . Skin graft to gum Right 08/2013  . TONSILLECTOMY AND ADENOIDECTOMY    . TOTAL ABDOMINAL HYSTERECTOMY       Social History   reports that she quit smoking about 13 years ago. Her smoking use included cigarettes. She has a 22.50 pack-year smoking history. She has never used smokeless tobacco. She reports that she does not drink alcohol and does not use drugs.   Family History   Her family history includes Diabetes in her father; High Cholesterol in her mother; High blood pressure in her mother.   Allergies Allergies  Allergen Reactions  . Codeine Nausea And Vomiting  . Lamictal [Lamotrigine] Rash     Home Medications  Prior to Admission medications   Medication Sig Start Date End Date Taking? Authorizing Provider  atorvastatin (LIPITOR) 20 MG tablet Take 20 mg by mouth daily. 08/24/18  Yes [provider]  clonazePAM (KLONOPIN) 0.5 MG tablet Take 1 tablet (0.5 mg total) by mouth 2 (two) times daily. 04/13/20  Yes Suzzanne Cloud, NP  divalproex (DEPAKOTE ER) 500 MG 24 hr tablet Take 1 tablet (500 mg total) by mouth 2 (two) times daily. 11/25/19  Yes Suzzanne Cloud, NP  ibuprofen (ADVIL) 200 MG tablet Take 400 mg by mouth every 6 (six) hours as needed for moderate pain.  Yes [provider]  lacosamide (VIMPAT) 200 MG TABS tablet Take 1 tablet (200 mg total) by mouth 2 (two) times daily. 11/25/19  Yes Suzzanne Cloud, NP  levocetirizine (XYZAL) 5 MG tablet Take 5 mg by mouth every evening.    Yes [provider]  omeprazole (PRILOSEC) 40 MG capsule Take 1 capsule (40 mg total) by mouth daily. 11/03/19   Yes Laurine Blazer A, PA-C  oxybutynin (DITROPAN-XL) 5 MG 24 hr tablet Take 5 mg by mouth at bedtime.    Yes [provider]  Pediatric Multiple Vit-C-FA (PEDIATRIC MULTIVITAMIN) chewable tablet Chew 1 tablet by mouth daily.     Yes [provider]

## 2020-04-21 NOTE — Progress Notes (Signed)
LTM maintenance completed; no skin breakdown was seen. reprepped under O2, C3, F3,and C4.

## 2020-04-21 NOTE — Progress Notes (Addendum)
Pt w/ medium sized, watery stool, soap suds enema held. Upon cleaning pt, pt vomited twice, became anxious/agitated, SpO2 dropped into the 80s. Zofran given, precedex increased, CCM MD and RT contacted.

## 2020-04-21 NOTE — Progress Notes (Signed)
eLink Physician-Brief Progress Note Patient Name: Maria [name] DOB: 08-Jan-1961 MRN: [id number]   Date of Service  04/21/2020  HPI/Events of Note  Request from Bedside RN is for patient to get a one time dose of 2 mg of Ativan for "agitatio", on entering the room via camera patient is asleep without any indicators of agitation.  eICU Interventions  No indication for Ativan dose at this time.        Kerry Kass Sankalp Ferrell 04/21/2020, 12:45 AM

## 2020-04-21 NOTE — Progress Notes (Addendum)
Called to bedside for worsening respiratory status.  On my arrival patient is agitated delirium, being pulled up in bed by bedside staff.  She has had bowel movements but is still having ongoing emesis.  Patient is n.p.o.  Likely agitated delirium from the ICU stay, recent sepsis with pyelonephritis, baseline intellectual delay, multiple sedating agents and antiepileptic agents.  Discussed plan of care with sister at bedside.  We will continue Precedex to keep her calm as tolerated, plan will be to get her out of bed with mobility once neuro checks can be lifted from a neurology standpoint.  It looks like EEG leads can be removed will discuss with EEG tech.  We have started prokinetic agents to help with emesis as well.  Ultimately the delirium is very difficult to manage and she is already on multiple medications that are trying to manage this.  We discussed the risk for intubation should she have a massive aspiration event and will do everything we can to prevent that.  Based on my assessment currently with a chest x-ray showing low lung volumes and sats improving when she is relaxed and calm and taking slow deep breaths I do not think she needs to be intubated at this time.  Lenice Llamas, MD [facility] [place] Pager: [phone]

## 2020-04-21 NOTE — Progress Notes (Signed)
eLink Physician-Brief Progress Note Patient Name: Maria [name] DOB: [birth year] MRN: [id number]   Date of Service  04/21/2020  HPI/Events of Note  Patient has been vomiting, there's a concern for aspiration so she's been made NPO, bedside RN requesting maitainance iv fluids.  eICU Interventions  D 5 LR ordered @ 75 ml / hour, stat portable CXR ordered to r/o aspiration.        Kerry Kass [name] 04/21/2020, 8:52 PM

## 2020-04-21 NOTE — Progress Notes (Signed)
Gave 1/2 lactulose rectal enema, began leaking around catheter despite full balloon inflation, coming back out, pt then began vomiting, unable to give rest of enema. Zofran given, CCM MD notified.

## 2020-04-21 NOTE — Procedures (Signed)
Patient Name: CRESSIE BETZLER  MRN: [id number]  Epilepsy Attending: Lora Havens  Referring Physician/Provider: Dr Zeb Comfort Duration: 04/20/2020 1109 to 04/21/2020 0400, 9784- 1600  Patient history: 59yo F with h/o epilepsy now with ams. EEG to evaluate for seizure  Level of alertness: Lethargic  AEDs during EEG study: LCM, VPA, Clonopin, LEV  Technical aspects: This EEG study was done with scalp electrodes positioned according to the 10-20 International system of electrode placement. Electrical activity was acquired at a sampling rate of 500Hz  and reviewed with a high frequency filter of 70Hz  and a low frequency filter of 1Hz . EEG data were recorded continuously and digitally stored.   Description: EEG showed continuous generalized 3 to 6 Hz theta-delta slowing. Generalized spike and wave discharges were also noted, at times lasting 5-7seconds consistent with brief-ictal-interictal rhythmic discharges ( BIRDS).     Event button was pressed on 04/20/2020 at 2154. Patient was noted to have sudden arm and leg elevation followed by jerking briefly for 2-3 seconds. Concomitant eeg showed generalized polyspikes lasting 10-12 seconds.   ABNORMALITY - Seizure, generalized - BIRDs - Spike and wave, generalized - Continuous slow, generalized  IMPRESSION: This study showed one seizure on 04/20/2020 at 2154 during which patient was noted to have sudden arm and leg elevation followed by jerking for 2 to 3 seconds with generalized onset.  Additionally there is evidence of moderate to severe diffuse encephalopathy, likely secondary to seizures.  Lile Mccurley [name]

## 2020-04-21 NOTE — Progress Notes (Addendum)
Subjective: Had 1 brief seizure yesterday and was loaded with Keppra.  No further seizures overnight.  On Precedex this morning.  Sister at bedside.  ROS: Unable to obtain due to poor mental status  Examination  Vital signs in last 24 hours: Temp:  [94.1 F (34.5 C)-98.3 F (36.8 C)] 97.8 F (36.6 C) (11/19 1100) Pulse Rate:  [51-86] 72 (11/19 0600) Resp:  [15-32] 24 (11/19 0600) BP: (94-146)/(54-88) 96/54 (11/19 0600) SpO2:  [85 %-99 %] 94 % (11/19 0756) FiO2 (%):  [100 %] 100 % (11/19 0756)  General: lying in bed, not in apparent distress CVS: pulse-normal rate and rhythm RS: breathing comfortably, coarse breath sounds bilaterally Extremities: normal, warm  Neuro: Opens eyes to noxious stimuli, follows simple commands like move her arms and legs, PERRLA, no gaze deviation, no apparent facial asymmetry, moving all 4 extremities.  Basic Metabolic Panel: Recent Labs  Lab 04/16/20 0731 04/17/20 0751 04/17/20 1825 04/18/20 0416 04/18/20 0416 04/18/20 1700 04/18/20 1700 04/19/20 0539 04/19/20 0539 04/20/20 0422 04/20/20 1700 04/21/20 0400  NA 149*   < >  --  149*   < > 151*  --  149*  --  145 145 142  K 4.7   < >  --  4.0   < > 3.7  --  3.5  --  3.7 3.6 3.7  CL 114*   < >  --  115*   < > 115*  --  112*  --  107 104 100  CO2 22   < >  --  24   < > 27  --  28  --  28 32 29  GLUCOSE 115*   < >  --  186*   < > 161*  --  166*  --  151* 190* 130*  BUN 33*   < >  --  38*   < > 41*  --  34*  --  16 15 16   CREATININE 1.06*   < >  --  0.81   < > 0.72  --  0.72  --  0.51 0.61 0.65  CALCIUM 8.7*   < >  --  8.8*   < > 8.6*   < > 8.5*   < > 8.7* 9.0 9.0  MG 2.0  --  2.2 2.4  --  2.3  --  2.2  --   --   --   --   PHOS  --   --  2.3* 2.0*  --  1.9*  --  2.9  --   --   --   --    < > = values in this interval not displayed.    CBC: Recent Labs  Lab 04/15/20 0858 04/15/20 0858 04/16/20 0731 04/16/20 0731 04/17/20 0751 04/17/20 0751 04/18/20 0416 04/18/20 0940 04/19/20 0539  04/20/20 0422 04/21/20 0400  WBC 10.9*   < > 10.8*   < > 6.1  --  7.1  --  8.1 5.7 5.0  NEUTROABS 8.5*  --  8.3*  --   --   --  5.4  --  4.8 4.1  --   HGB 10.3*   < > 10.4*   < > 8.3*  --  9.6*  --  9.6* 9.2* 10.4*  HCT 33.3*   < > 34.3*   < > 26.6*  --  30.8*  --  30.8* 29.1* 32.5*  MCV 101.2*   < > 104.6*   < > 101.1*  --  100.3*  --  100.7* 100.7* 97.3  PLT 115*   < > 138*   < > 121*   < > 146* 131* 181 200 231   < > = values in this interval not displayed.     Coagulation Studies: No results for input(s): LABPROT, INR in the last 72 hours.  Imaging No new brain imaging overnight  ASSESSMENT AND PLAN: 59 year old female with history of intellectual disability, epilepsy who was admitted for abdominal pain as was found to have sepsis with pyelonephritis.  While in the hospital she was noted to have seizures.  Epilepsy with breakthrough seizures Sepsis with pyelonephritis Hyperammonemia Microcytic anemia -Breakthrough seizures in setting of infection.  LTM EEG still showing brief ictal-interictal discharges.   Recommendations: -Continue valproic acid 500 mg twice daily, Vimpat 200 mg twice daily, clonazepam 0.5 mg 3 times daily.  Cannot increase valproic acid due to hyperammonemia.  Therefore, increase Klonopin to 1 mg 3 times daily if patient has any further seizures. - Of note, it is possible that patient at baseline has brief ictal-interictal discharges.  Therefore, will not increase AEDs unless patient has clinical seizures or persistent altered mental status. -We will DC LTM EEG tomorrow if patient remains seizure-free -Seizure precautions -PRN IV Ativan 2 mg for clinical etiology more than 2 minutes or multiple seizures without return to baseline -Management of rest of comorbidities per primary team  I have spent a total of 35  minutes with the patient reviewing hospital notes,  test results, labs and examining the patient as well as establishing an assessment and plan that  was discussed personally with the patient's sister at bedside.  > 50% of time was spent in direct patient care.     Zeb Comfort Epilepsy [facility] For questions after 5pm please refer to AMION to reach the Neurologist on call

## 2020-04-21 NOTE — Plan of Care (Signed)
Not notified of any clinical seizures after the 10pm notification. EEG with no seizures on prelim review overnight. Neurology will continue to follow.  -- Amie Portland, MD [facility]

## 2020-04-21 NOTE — Progress Notes (Signed)
CPT initiated and then quickly terminated.  RN expressed concerns re: recent and frequent emesis with potential aspiration events.    I concur with the RN's assessment and evaluation.  Will monitor and attempt to wean oxygen as pt's condition allows.

## 2020-04-21 NOTE — Progress Notes (Signed)
PT Cancellation Note  Patient Details Name: Maria [name] MRN: [id number] DOB: 1960/08/20   Cancelled Treatment:    Reason Eval/Treat Not Completed: Patient not medically ready (pt remains lethargic on continuous EEG and not currently appropriate)   Calan Doren B Amar Sippel 04/21/2020, 10:07 AM  Bayard Males, PT Acute Rehabilitation Services Pager: [phone] Office: [phone]

## 2020-04-21 NOTE — Progress Notes (Signed)
OT Cancellation Note  Patient Details Name: EBONY YORIO MRN: [id number] DOB: 07/05/1960   Cancelled Treatment:    Reason Eval/Treat Not Completed: Patient not medically ready--pt remains lethargic on continuous EEG and not currently appropriate for OT at this time.  Will follow and see as able.   Jolaine Artist, OT Acute Rehabilitation Services Pager [phone] Office [phone]   Delight Stare 04/21/2020, 10:13 AM

## 2020-04-21 NOTE — Progress Notes (Signed)
Pt w/ emesis again x1 after meds given through cortrak. Relayed to CCM MD, plan for strict NPO.

## 2020-04-21 NOTE — Progress Notes (Signed)
CPT held at this time. Pt with recent emesis and agitation. RT will continue to monitor.

## 2020-04-22 ENCOUNTER — Inpatient Hospital Stay (HOSPITAL_COMMUNITY): Payer: Medicare Other

## 2020-04-22 DIAGNOSIS — R410 Disorientation, unspecified: Secondary | ICD-10-CM | POA: Diagnosis not present

## 2020-04-22 DIAGNOSIS — R569 Unspecified convulsions: Secondary | ICD-10-CM | POA: Diagnosis not present

## 2020-04-22 DIAGNOSIS — G9340 Encephalopathy, unspecified: Secondary | ICD-10-CM

## 2020-04-22 DIAGNOSIS — N179 Acute kidney failure, unspecified: Secondary | ICD-10-CM | POA: Diagnosis not present

## 2020-04-22 DIAGNOSIS — N12 Tubulo-interstitial nephritis, not specified as acute or chronic: Secondary | ICD-10-CM | POA: Diagnosis not present

## 2020-04-22 DIAGNOSIS — G40309 Generalized idiopathic epilepsy and epileptic syndromes, not intractable, without status epilepticus: Secondary | ICD-10-CM

## 2020-04-22 DIAGNOSIS — F79 Unspecified intellectual disabilities: Secondary | ICD-10-CM

## 2020-04-22 LAB — BASIC METABOLIC PANEL
Anion gap: 11 (ref 5–15)
BUN: 20 mg/dL (ref 6–20)
CO2: 31 mmol/L (ref 22–32)
Calcium: 8.8 mg/dL — ABNORMAL LOW (ref 8.9–10.3)
Chloride: 98 mmol/L (ref 98–111)
Creatinine, Ser: 0.73 mg/dL (ref 0.44–1.00)
GFR, Estimated: >60 mL/min (ref >60)
Glucose, Bld: 190 mg/dL — ABNORMAL HIGH (ref 70–99)
Potassium: 3.6 mmol/L (ref 3.5–5.1)
Sodium: 140 mmol/L (ref 135–145)

## 2020-04-22 LAB — GLUCOSE, CAPILLARY
Glucose-Capillary: 105 mg/dL — ABNORMAL HIGH (ref 70–99)
Glucose-Capillary: 115 mg/dL — ABNORMAL HIGH (ref 70–99)
Glucose-Capillary: 125 mg/dL — ABNORMAL HIGH (ref 70–99)
Glucose-Capillary: 148 mg/dL — ABNORMAL HIGH (ref 70–99)
Glucose-Capillary: 167 mg/dL — ABNORMAL HIGH (ref 70–99)
Glucose-Capillary: 95 mg/dL (ref 70–99)

## 2020-04-22 MED ORDER — POTASSIUM CHLORIDE 10 MEQ/100ML IV SOLN
10.0000 meq | INTRAVENOUS | Status: AC
  Administered 2020-04-22 (×2): 10 meq via INTRAVENOUS
  Filled 2020-04-22 (×2): qty 100

## 2020-04-22 MED ORDER — HALOPERIDOL LACTATE 5 MG/ML IJ SOLN
5.0000 mg | INTRAMUSCULAR | Status: DC | PRN
  Administered 2020-04-22 – 2020-04-24 (×3): 5 mg via INTRAVENOUS
  Filled 2020-04-22 (×3): qty 1

## 2020-04-22 NOTE — Progress Notes (Signed)
Subjective: Sitting up in bed. Sister at bedside. No seizures overnight. LTM discontinued this morning at 9:28. On Precedex.  Objective: Current vital signs: BP 94/61 (BP Location: Left Arm)   Pulse 69   Temp (!) 97.3 F (36.3 C) (Axillary)   Resp 20   Ht 5\' 6"  (1.676 m)   Wt 70.2 kg   SpO2 90%   BMI 24.98 kg/m  Vital signs in last 24 hours: Temp:  [96.9 F (36.1 C)-98.3 F (36.8 C)] 97.3 F (36.3 C) (11/20 0700) Pulse Rate:  [59-109] 69 (11/20 0800) Resp:  [13-45] 20 (11/20 0800) BP: (72-143)/(44-93) 94/61 (11/20 0800) SpO2:  [87 %-100 %] 90 % (11/20 0800) FiO2 (%):  [100 %] 100 % (11/19 1944)  Intake/Output from previous day: 11/19 0701 - 11/20 0700 In: 1668.8 [I.V.:1315.7; NG/GT:300; IV Piggyback:53.1] Out: 800 [Urine:800] Intake/Output this shift: Total I/O In: 108.6 [I.V.:108.6] Out: 130 [Urine:130] Nutritional status:  Diet Order            Diet NPO time specified Except for: Ice Chips  Diet effective now                General: lying in bed, not in apparent distress CVS: pulse-normal rate and rhythm RS: breathing comfortably Extremities: normal, warm to touch with brisk capillary refill.   Neurologic Exam: Ment: Opens eyes sluggishly to noxious stimuli, attempts to follow simple commands at this time.   CN: PERRLA, no gaze deviation, no apparent facial asymmetry Motor/Sensory: Moves extremities to noxious stimuli.   Lab Results: Results for orders placed or performed during the hospital encounter of 04/14/20 (from the past 48 hour(s))  Glucose, capillary     Status: Abnormal   Collection Time: 04/20/20 11:12 AM  Result Value Ref Range   Glucose-Capillary 126 (H) 70 - 99 mg/dL    Comment: Glucose reference range applies only to samples taken after fasting for at least 8 hours.  Glucose, capillary     Status: Abnormal   Collection Time: 04/20/20  3:20 PM  Result Value Ref Range   Glucose-Capillary 159 (H) 70 - 99 mg/dL    Comment: Glucose reference  range applies only to samples taken after fasting for at least 8 hours.  Basic metabolic panel     Status: Abnormal   Collection Time: 04/20/20  5:00 PM  Result Value Ref Range   Sodium 145 135 - 145 mmol/L   Potassium 3.6 3.5 - 5.1 mmol/L   Chloride 104 98 - 111 mmol/L   CO2 32 22 - 32 mmol/L   Glucose, Bld 190 (H) 70 - 99 mg/dL    Comment: Glucose reference range applies only to samples taken after fasting for at least 8 hours.   BUN 15 6 - 20 mg/dL   Creatinine, Ser 0.61 0.44 - 1.00 mg/dL   Calcium 9.0 8.9 - 10.3 mg/dL   GFR, Estimated >60 >60 mL/min    Comment: (NOTE) Calculated using the CKD-EPI Creatinine Equation (2021)    Anion gap 9 5 - 15    Comment: Performed at [facility] [address], [place], [place] [address]  Glucose, capillary     Status: Abnormal   Collection Time: 04/20/20  7:13 PM  Result Value Ref Range   Glucose-Capillary 125 (H) 70 - 99 mg/dL    Comment: Glucose reference range applies only to samples taken after fasting for at least 8 hours.  Glucose, capillary     Status: Abnormal   Collection Time: 04/20/20 11:18  PM  Result Value Ref Range   Glucose-Capillary 123 (H) 70 - 99 mg/dL    Comment: Glucose reference range applies only to samples taken after fasting for at least 8 hours.  Glucose, capillary     Status: Abnormal   Collection Time: 04/21/20  3:09 AM  Result Value Ref Range   Glucose-Capillary 113 (H) 70 - 99 mg/dL    Comment: Glucose reference range applies only to samples taken after fasting for at least 8 hours.  Ammonia     Status: Abnormal   Collection Time: 04/21/20  4:00 AM  Result Value Ref Range   Ammonia 46 (H) 9 - 35 umol/L    Comment: Performed at [facility], [place] [address], [place], [place] [address]  Basic metabolic panel     Status: Abnormal   Collection Time: 04/21/20  4:00 AM  Result Value Ref Range   Sodium 142 135 - 145 mmol/L   Potassium 3.7 3.5 - 5.1 mmol/L   Chloride 100 98 - 111 mmol/L   CO2 29 22  - 32 mmol/L   Glucose, Bld 130 (H) 70 - 99 mg/dL    Comment: Glucose reference range applies only to samples taken after fasting for at least 8 hours.   BUN 16 6 - 20 mg/dL   Creatinine, Ser 0.65 0.44 - 1.00 mg/dL   Calcium 9.0 8.9 - 10.3 mg/dL   GFR, Estimated >60 >60 mL/min    Comment: (NOTE) Calculated using the CKD-EPI Creatinine Equation (2021)    Anion gap 13 5 - 15    Comment: Performed at [facility] [address], [place], [place] [address]  CBC     Status: Abnormal   Collection Time: 04/21/20  4:00 AM  Result Value Ref Range   WBC 5.0 4.0 - 10.5 K/uL   RBC 3.34 (L) 3.87 - 5.11 MIL/uL   Hemoglobin 10.4 (L) 12.0 - 15.0 g/dL   HCT 32.5 (L) 36 - 46 %   MCV 97.3 80.0 - 100.0 fL   MCH 31.1 26.0 - 34.0 pg   MCHC 32.0 30.0 - 36.0 g/dL   RDW 14.7 11.5 - 15.5 %   Platelets 231 150 - 400 K/uL   nRBC 2.2 (H) 0.0 - 0.2 %    Comment: Performed at [facility], [place] [address], [place], [place] [address]  Glucose, capillary     Status: Abnormal   Collection Time: 04/21/20  7:13 AM  Result Value Ref Range   Glucose-Capillary 129 (H) 70 - 99 mg/dL    Comment: Glucose reference range applies only to samples taken after fasting for at least 8 hours.  Glucose, capillary     Status: Abnormal   Collection Time: 04/21/20 11:04 AM  Result Value Ref Range   Glucose-Capillary 112 (H) 70 - 99 mg/dL    Comment: Glucose reference range applies only to samples taken after fasting for at least 8 hours.  Glucose, capillary     Status: None   Collection Time: 04/21/20  2:59 PM  Result Value Ref Range   Glucose-Capillary 93 70 - 99 mg/dL    Comment: Glucose reference range applies only to samples taken after fasting for at least 8 hours.  Glucose, capillary     Status: Abnormal   Collection Time: 04/21/20  7:48 PM  Result Value Ref Range   Glucose-Capillary 114 (H) 70 - 99 mg/dL    Comment: Glucose reference range applies only to samples taken after  fasting for at least 8 hours.   Glucose, capillary     Status: Abnormal   Collection Time: 04/21/20 11:15 PM  Result Value Ref Range   Glucose-Capillary 114 (H) 70 - 99 mg/dL    Comment: Glucose reference range applies only to samples taken after fasting for at least 8 hours.  Glucose, capillary     Status: Abnormal   Collection Time: 04/22/20  3:33 AM  Result Value Ref Range   Glucose-Capillary 148 (H) 70 - 99 mg/dL    Comment: Glucose reference range applies only to samples taken after fasting for at least 8 hours.  Basic metabolic panel     Status: Abnormal   Collection Time: 04/22/20  5:00 AM  Result Value Ref Range   Sodium 140 135 - 145 mmol/L   Potassium 3.6 3.5 - 5.1 mmol/L   Chloride 98 98 - 111 mmol/L   CO2 31 22 - 32 mmol/L   Glucose, Bld 190 (H) 70 - 99 mg/dL    Comment: Glucose reference range applies only to samples taken after fasting for at least 8 hours.   BUN 20 6 - 20 mg/dL   Creatinine, Ser 0.73 0.44 - 1.00 mg/dL   Calcium 8.8 (L) 8.9 - 10.3 mg/dL   GFR, Estimated >60 >60 mL/min    Comment: (NOTE) Calculated using the CKD-EPI Creatinine Equation (2021)    Anion gap 11 5 - 15    Comment: Performed at [facility] [address], [place], [place] [address]  Glucose, capillary     Status: Abnormal   Collection Time: 04/22/20  7:24 AM  Result Value Ref Range   Glucose-Capillary 167 (H) 70 - 99 mg/dL    Comment: Glucose reference range applies only to samples taken after fasting for at least 8 hours.    Recent Results (from the past 240 hour(s))  Urine culture     Status: Abnormal   Collection Time: 04/14/20 11:20 AM   Specimen: Urine, Clean Catch  Result Value Ref Range Status   Specimen Description   Final    URINE, CLEAN CATCH Performed at [facility], [address], [place], [place] [address]    Special Requests   Final    NONE Performed at [facility], [address], [place], [place] [address]    Culture 50,000 COLONIES/mL ESCHERICHIA COLI (A)  Final   Report Status  04/17/2020 FINAL  Final   Organism ID, Bacteria ESCHERICHIA COLI (A)  Final      Susceptibility   Escherichia coli - MIC*    AMPICILLIN <=2 SENSITIVE Sensitive     CEFAZOLIN <=4 SENSITIVE Sensitive     CEFEPIME <=0.12 SENSITIVE Sensitive     CEFTRIAXONE <=0.25 SENSITIVE Sensitive     CIPROFLOXACIN >=4 RESISTANT Resistant     GENTAMICIN <=1 SENSITIVE Sensitive     IMIPENEM <=0.25 SENSITIVE Sensitive     NITROFURANTOIN 128 RESISTANT Resistant     TRIMETH/SULFA >=320 RESISTANT Resistant     AMPICILLIN/SULBACTAM <=2 SENSITIVE Sensitive     PIP/TAZO <=4 SENSITIVE Sensitive     * 50,000 COLONIES/mL ESCHERICHIA COLI  Culture, blood (routine x 2)     Status: None   Collection Time: 04/14/20  1:58 PM   Specimen: Blood  Result Value Ref Range Status   Specimen Description BLOOD LEFT ARM  Final   Special Requests   Final    BOTTLES DRAWN AEROBIC AND ANAEROBIC Blood Culture adequate volume   Culture   Final  NO GROWTH 5 DAYS Performed at [facility], [address], [place], [place] [address]    Report Status 04/19/2020 FINAL  Final  Culture, blood (routine x 2)     Status: None   Collection Time: 04/14/20  2:08 PM   Specimen: Blood  Result Value Ref Range Status   Specimen Description BLOOD LEFT WRIST  Final   Special Requests   Final    BOTTLES DRAWN AEROBIC AND ANAEROBIC Blood Culture adequate volume   Culture   Final    NO GROWTH 5 DAYS Performed at [facility], [address] [place], [place] [address]    Report Status 04/19/2020 FINAL  Final  Respiratory Panel by RT PCR (Flu A&B, Covid) - Nasopharyngeal Swab     Status: None   Collection Time: 04/14/20  2:12 PM   Specimen: Nasopharyngeal Swab  Result Value Ref Range Status   SARS Coronavirus 2 by RT PCR NEGATIVE NEGATIVE Final    Comment: (NOTE) SARS-CoV-2 target nucleic acids are NOT DETECTED.  The SARS-CoV-2 RNA is generally detectable in upper respiratoy specimens during the acute phase of infection. The  lowest concentration of SARS-CoV-2 viral copies this assay can detect is 131 copies/mL. A negative result does not preclude SARS-Cov-2 infection and should not be used as the sole basis for treatment or other patient management decisions. A negative result may occur with  improper specimen collection/handling, submission of specimen other than nasopharyngeal swab, presence of viral mutation(s) within the areas targeted by this assay, and inadequate number of viral copies (<131 copies/mL). A negative result must be combined with clinical observations, patient history, and epidemiological information. The expected result is Negative.  Fact Sheet for Patients:  PinkCheek.be  Fact Sheet for Healthcare Providers:  GravelBags.it  This test is no t yet approved or cleared by the [place] FDA and  has been authorized for detection and/or diagnosis of SARS-CoV-2 by FDA under an Emergency Use Authorization (EUA). This EUA will remain  in effect (meaning this test can be used) for the duration of the COVID-19 declaration under Section 564(b)(1) of the Act, 21 U.S.C. section 360bbb-3(b)(1), unless the authorization is terminated or revoked sooner.     Influenza A by PCR NEGATIVE NEGATIVE Final   Influenza B by PCR NEGATIVE NEGATIVE Final    Comment: (NOTE) The Xpert Xpress SARS-CoV-2/FLU/RSV assay is intended as an aid in  the diagnosis of influenza from Nasopharyngeal swab specimens and  should not be used as a sole basis for treatment. Nasal washings and  aspirates are unacceptable for Xpert Xpress SARS-CoV-2/FLU/RSV  testing.  Fact Sheet for Patients: PinkCheek.be  Fact Sheet for Healthcare Providers: GravelBags.it  This test is not yet approved or cleared by the [place] FDA and  has been authorized for detection and/or diagnosis of SARS-CoV-2 by  FDA under  an Emergency Use Authorization (EUA). This EUA will remain  in effect (meaning this test can be used) for the duration of the  Covid-19 declaration under Section 564(b)(1) of the Act, 21  U.S.C. section 360bbb-3(b)(1), unless the authorization is  terminated or revoked. Performed at [facility], [address], [place], [place] [address]   Respiratory Panel by RT PCR (Flu A&B, Covid) - Nasal Mucosa     Status: None   Collection Time: 04/16/20 11:37 AM   Specimen: Nasal Mucosa; Nasopharyngeal  Result Value Ref Range Status   SARS Coronavirus 2 by RT PCR NEGATIVE NEGATIVE Final    Comment: (NOTE) SARS-CoV-2 target nucleic acids  are NOT DETECTED.  The SARS-CoV-2 RNA is generally detectable in upper respiratoy specimens during the acute phase of infection. The lowest concentration of SARS-CoV-2 viral copies this assay can detect is 131 copies/mL. A negative result does not preclude SARS-Cov-2 infection and should not be used as the sole basis for treatment or other patient management decisions. A negative result may occur with  improper specimen collection/handling, submission of specimen other than nasopharyngeal swab, presence of viral mutation(s) within the areas targeted by this assay, and inadequate number of viral copies (<131 copies/mL). A negative result must be combined with clinical observations, patient history, and epidemiological information. The expected result is Negative.  Fact Sheet for Patients:  PinkCheek.be  Fact Sheet for Healthcare Providers:  GravelBags.it  This test is no t yet approved or cleared by the [place] FDA and  has been authorized for detection and/or diagnosis of SARS-CoV-2 by FDA under an Emergency Use Authorization (EUA). This EUA will remain  in effect (meaning this test can be used) for the duration of the COVID-19 declaration under Section 564(b)(1) of the Act, 21 U.S.C. section  360bbb-3(b)(1), unless the authorization is terminated or revoked sooner.     Influenza A by PCR NEGATIVE NEGATIVE Final   Influenza B by PCR NEGATIVE NEGATIVE Final    Comment: (NOTE) The Xpert Xpress SARS-CoV-2/FLU/RSV assay is intended as an aid in  the diagnosis of influenza from Nasopharyngeal swab specimens and  should not be used as a sole basis for treatment. Nasal washings and  aspirates are unacceptable for Xpert Xpress SARS-CoV-2/FLU/RSV  testing.  Fact Sheet for Patients: PinkCheek.be  Fact Sheet for Healthcare Providers: GravelBags.it  This test is not yet approved or cleared by the [place] FDA and  has been authorized for detection and/or diagnosis of SARS-CoV-2 by  FDA under an Emergency Use Authorization (EUA). This EUA will remain  in effect (meaning this test can be used) for the duration of the  Covid-19 declaration under Section 564(b)(1) of the Act, 21  U.S.C. section 360bbb-3(b)(1), unless the authorization is  terminated or revoked. Performed at [facility], [address] [place], [place] [address]   MRSA PCR Screening     Status: None   Collection Time: 04/16/20 11:37 AM   Specimen: Nasal Mucosa; Nasopharyngeal  Result Value Ref Range Status   MRSA by PCR NEGATIVE NEGATIVE Final    Comment:        The GeneXpert MRSA Assay (FDA approved for NASAL specimens only), is one component of a comprehensive MRSA colonization surveillance program. It is not intended to diagnose MRSA infection nor to guide or monitor treatment for MRSA infections. Performed at [facility], [address], [place], [place] [address]     Lipid Panel No results for input(s): CHOL, TRIG, HDL, CHOLHDL, VLDL, LDLCALC in the last 72 hours.  Studies/Results: EEG  Result Date: 04/20/2020 Lora Havens, MD     04/21/2020  8:41 AM Patient Name: KEYAIRRA KOLINSKI MRN: [id number] Epilepsy Attending: Lora Havens  Referring Physician/Provider: Dr Ina Homes Date: 04/20/2020 Duration: 23.40 mins Patient history: 59yo F with h/o epilepsy now with ams. EEG to evaluate for seizure Level of alertness: Lethargic AEDs during EEG study: LCM, VPA, Clonopin Technical aspects: This EEG study was done with scalp electrodes positioned according to the 10-20 International system of electrode placement. Electrical activity was acquired at a sampling rate of 500Hz  and reviewed with a high frequency filter of 70Hz  and a low frequency filter of  1Hz . EEG data were recorded continuously and digitally stored. Description: EEG showed continuous generalized 3 to 6 Hz theta-delta slowing. Patient was noted to have two episodes at 1042 and 1045 of sudden arm and leg elevation followed by jerking briefly for 2-3 seconds. Concomitant eeg showed generalized polyspikes lasting 10-12 seconds. Generalized spike and wave discharges were also noted, at times lasting 5-7seconds consistent with brief-ictal-interictal rhythmic discharges ( BIRDS). Hyperventilation and photic stimulation were not performed.   ABNORMALITY - Seizure, generalized - BIRDs - Spike and wave, generalized - Continuous slow, generalized IMPRESSION: This study showed two seizures at 1042 and 1045 during which patient was noted to have sudden arm and leg elevation followed by jerking for 2 to 3 seconds with generalized onset.  Additionally there is evidence of severe diffuse encephalopathy, likely secondary to seizures. Dr. Ina Homes was immediately notified. Lora Havens   DG Abd 1 View  Result Date: 04/21/2020 CLINICAL DATA:  59 year old female with hypoxia. Feeding tube placement. EXAM: ABDOMEN - 1 VIEW COMPARISON:  CT Abdomen and Pelvis 04/14/2020. FINDINGS: Portable AP supine view at 1012 hours. Enteric feeding tube tip is at the level of the distal gastric body. Bowel-gas pattern remains within normal limits. No acute osseous abnormality identified. IMPRESSION: Enteric  feeding tube tip at the level of the distal gastric body. If post pyloric placement is desired recommend advancement of 6-8 cm to allow post pyloric transit. Electronically Signed   By: Genevie Ann M.D.   On: 04/21/2020 11:09   DG CHEST PORT 1 VIEW  Result Date: 04/21/2020 CLINICAL DATA:  59 year old female with hypoxia. EXAM: PORTABLE CHEST 1 VIEW COMPARISON:  Portable chest 04/16/2020 and earlier. FINDINGS: Portable AP semi upright view at 1043 hours. Right PICC line and enteric feeding tube and been placed. PICC line tip projects at the right atrium. Feeding tube courses into the left abdomen, tip not included. No endotracheal tube identified. Lower lung volumes with increasing bibasilar opacity including some retrocardiac air bronchograms now. However, right central lung and perihilar ventilation has improved. No superimposed pneumothorax or pulmonary edema. No definite pleural effusion. No acute osseous abnormality identified. Paucity of bowel gas in the upper abdomen. IMPRESSION: 1. Lower lung volumes and increasing bibasilar opacity, left lower lobe consolidation suspected now. 2. However, right mid lung and perihilar ventilation has improved. 3. Right PICC line and enteric feeding tube placed. Electronically Signed   By: Genevie Ann M.D.   On: 04/21/2020 11:08    Medications:  Scheduled: . atorvastatin  20 mg Per Tube QPM  . chlorhexidine  15 mL Mouth Rinse BID  . Chlorhexidine Gluconate Cloth  6 each Topical Daily  . enoxaparin (LOVENOX) injection  40 mg Subcutaneous Q24H  . feeding supplement (PROSource TF)  45 mL Per Tube BID  . free water  200 mL Per Tube Q3H while awake  . insulin aspart  0-9 Units Subcutaneous Q4H  . LORazepam  1 mg Intravenous Q8H  . mouth rinse  15 mL Mouth Rinse q12n4p  . metoCLOPramide (REGLAN) injection  10 mg Intravenous Q6H  . multivitamin with minerals  1 tablet Per Tube Daily  . pantoprazole (PROTONIX) IV  40 mg Intravenous QHS  . QUEtiapine  50 mg Per Tube BID   . sodium chloride flush  10-40 mL Intracatheter Q12H  . sodium chloride HYPERTONIC  4 mL Nebulization BID   Continuous: . sodium chloride 10 mL/hr at 04/22/20 0800  . cefTRIAXone (ROCEPHIN)  IV 2 g (04/21/20 1452)  .  dexmedetomidine (PRECEDEX) IV infusion 1.4 mcg/kg/hr (04/22/20 0800)  . dextrose 5% lactated ringers 75 mL/hr at 04/22/20 0800  . feeding supplement (OSMOLITE 1.5 CAL) Stopped (04/20/20 2145)  . lacosamide (VIMPAT) IV 200 mg (04/21/20 2120)  . valproate sodium 55 mL/hr at 04/21/20 2300    Assessment: 59 year old female with history of intellectual disability, epilepsy who was admitted for abdominal pain as was found to have sepsis with pyelonephritis. While in the hospital she was noted to have seizures. -Epilepsy with breakthrough seizures -Sepsis with pyelonephritis -Hyperammonemia -Microcytic anemia - LTM discontinued today given no seizure activity seen.  - Ammonia elevated at 46 - LTM EEG report for this AM: Abnormalities: Spike and wave, generalized. Continuous slow, generalized. Impression: This studyis consistent with patient's known history of generalized epilepsy. Additionally there is evidence ofmild to moderate diffuse encephalopathy,non specific etiology. No seizures were seen during this study.  Recommendations: - Continue valproic acid 500 mg twice daily, Vimpat 200 mg twice daily,  - Of note, it is possible that patient at baseline has brief ictal-interictal discharges.  Therefore, will not increase AEDs unless patient has clinical seizures or persistent altered mental status. - Continue with Precedex for now and taper off when possible - LTM EEG discontinued this morning.  - Seizure precautions - PRNIV Ativan 2 mg for clinical etiology more than 2 minutes or multiple seizures without return to baseline - Management of rest of comorbidities per primary team    LOS: 8 days   @Electronically  signed: Dr. Kerney Elbe 04/22/2020  8:59 AM

## 2020-04-22 NOTE — Progress Notes (Signed)
CPT held due to family concern for possible vomiting episode. RN made aware.

## 2020-04-22 NOTE — Procedures (Addendum)
Patient Name:Maria [name] NBV:[id number] Epilepsy Attending:Tressa Maldonado [name] Referring Physician/Provider:Dr Zeb Comfort Duration: 04/21/2020 1600 to 04/22/2020 1314  Patient history:59yo F with h/o epilepsy now with ams. EEG to evaluate for seizure  Level of alertness:Lethargic  AEDs during EEG study:LCM, VPA, Clonopin  Technical aspects: This EEG study was done with scalp electrodes positioned according to the 10-20 International system of electrode placement. Electrical activity was acquired at a sampling rate of 500Hz  and reviewed with a high frequency filter of 70Hz  and a low frequency filter of 1Hz . EEG data were recorded continuously and digitally stored.   Description: During awake state, no clear posterior dominant rhythm was seen. Sleep was characterized by vertex waves, sleep spindles (12-14hz ), maximal frontocentral region. EEG showed continuous generalized 5 to 6 Hz theta slowing.Generalized spike and wave discharges were also noted, at times lasting 2-5 seconds consistent with brief-ictal-interictal rhythmic discharges ( BIRDS).  Parts of study were difficult to review due to electrode artifact.  ABNORMALITY - Spike and wave, generalized - Continuous slow, generalized  IMPRESSION: This studyis consistent with patient's known history of generalized epilepsy. Additionally there is evidence of mild to moderate diffuse encephalopathy,non specific etiology. No seizures were seen during this study.  Maria [name] [name]

## 2020-04-22 NOTE — Progress Notes (Signed)
LTM EEG discontinued - no skin breakdown at unhook.   

## 2020-04-22 NOTE — Progress Notes (Signed)
NAME:  Maria [name], MRN:  [id number], DOB:  29-Jul-1960, LOS: 8 ADMISSION DATE:  04/14/2020, CONSULTATION DATE:  04/16/20 REFERRING MD:  Forestine Na, Dr. Wynetta Emery, CHIEF COMPLAINT:  sepsis   Brief History   59 year old woman with hx of epilepsy, intellectual disability, GERD, here with sepsis from pyelonephritis complicated by delirium and seizures.  Past Medical History  Intellectual disability Epilepsy Chronic constipation Dysphagia  Significant Hospital Events   11/12 admitted w/ septic shock and acute TME 11/15 PICC and CorTrak placed.  11/16 off pressors. Still agitated and delirious. On mono rx for ecoli 11/18 Seizure loaded on keppra Consults:  Neurology  Procedures:  RUE PICC 11/15  Significant Diagnostic Tests:  CT abdomen:  Findings suspicious for pyelonephritis and small abscess in lower pole of left kidney. Suggest correlation with urinalysis, and recommend continued follow-up by CT to confirm resolution. No evidence of ureteral calculi or hydronephrosis.  EEG: - Seizure, generalized - BIRDs - Spike and wave, generalized - Continuous slow, generalized  EKG: NSR Qtc 460 Micro Data:  Urine cutlure:  (pansens) Ecoli  Blood culture: Negative  Antimicrobials:  Ceftriaxone 2g 11/12 --> Azithro 11/14 --11/15 Zosyn 11/14 --> 11/15  Interim history/subjective:  Overnight remained stable oxygenation requirements.  Still on precedex. No further seizures noted on EEG>    Objective   Blood pressure (!) 99/57, pulse 79, temperature (!) 97.3 F (36.3 C), temperature source Axillary, resp. rate 15, height 5\' 6"  (1.676 m), weight 70.2 kg, SpO2 93 %.    FiO2 (%):  [100 %] 100 %   Intake/Output Summary (Last 24 hours) at 04/22/2020 1044 Last data filed at 04/22/2020 0900 Gross per 24 hour  Intake 1560.09 ml  Output 430 ml  Net 1130.09 ml   Filed Weights   04/18/20 0439 04/19/20 0359 04/20/20 0324  Weight: 69.5 kg 69.5 kg 70.2 kg     Examination: General: caucasian female, sleeping in bed in no acute distress, chronically ill appearing Pulm: Continues to have rhonchi throughout,  no accessory muscle  use  Card: RRR, no murmurs abd: Distended, active BS, no masses Derm diffuse petechial rash no edema  Neuro: somnolent but arousable, intermittently follows commands  Resolved Hospital Problem list     Assessment & Plan:  Maria [name] is a 59 y.o. woman   Acute metabolic encephalopathy complicated by baseline intellectual disability & Hx of epilepsy Breakthrough seizures ICU delirium - No further seizures since 11/18.  - Neurology consulted, greatly appreciated reccomendations - Continue depakote 500 mg BID, Vimpat 200 mg BID. She is getting these IV currently due to NPO status. seroquel is held.  - continue Clonazepam 0.5mg  TID - Ativan 2 mg PRN for seizures - will add haldol 5 mg prn and taper down precedex today. Will put in chair position. Reorient establish day/night routine as able. Sister at bedside which should help.   Acute hypoxic respiratory failure secondary to aspiration pneumonitis - wean oxygen as tolerated goal saturations over 90%.  - suction and aspiration precautions - NPO for now, will rechallenge with trickle tube feeds when appropriate.  Constipation Patient with multiple episodes of emesis overnight. Started on lactulose for hyperammonemia with minimal stool output. Abdomen distended on exam today. KUB without obstruction, normal bowel gas pattern. - Lactulose enema   - Lactulose 30 mg four time daily  Moderate Protein Calorie malnutrition cortrak placed tolerating tube feeds at goal rate Plan Hold tube feeds due to vomiting and aspiration. reglan is scheduled to help with  this.   Septic shock secondary to E Coli Pyelonephritis, question of small associated abscess Shock resolved - continue ceftriaxone for total 14 day course.   Best practice:  Diet: Hold tube  feeds Pain/Anxiety/Delirium protocol (if indicated): wean precedex to off today.  VAP protocol (if indicated): N/A DVT prophylaxis: lovenox 40mg  daily GI prophylaxis: Protonix  Glucose control: ISS Mobility: PT  Code Status: Full  Family Communication: Discussed with sister at bedside Disposition: ICU  The patient is critically ill with multiple organ systems failure and requires high complexity decision making for assessment and support, frequent evaluation and titration of therapies, application of advanced monitoring technologies and extensive interpretation of multiple databases.   Critical Care Time devoted to patient care services described in this note is 45 minutes. This time reflects time of care of this [place] . This critical care time does not reflect separately billable procedures or procedure time, teaching time or supervisory time of PA/NP/Med student/Med Resident etc but could involve care discussion time.  Spero Geralds [facility] 04/22/2020 10:44 AM  Pager: [phone] After hours pager: [phone]   Labs   CBC: Recent Labs  Lab 04/16/20 0731 04/16/20 0731 04/17/20 0751 04/17/20 0751 04/18/20 0416 04/18/20 0940 04/19/20 0539 04/20/20 0422 04/21/20 0400  WBC 10.8*   < > 6.1  --  7.1  --  8.1 5.7 5.0  NEUTROABS 8.3*  --   --   --  5.4  --  4.8 4.1  --   HGB 10.4*   < > 8.3*  --  9.6*  --  9.6* 9.2* 10.4*  HCT 34.3*   < > 26.6*  --  30.8*  --  30.8* 29.1* 32.5*  MCV 104.6*   < > 101.1*  --  100.3*  --  100.7* 100.7* 97.3  PLT 138*   < > 121*   < > 146* 131* 181 200 231   < > = values in this interval not displayed.    Basic Metabolic Panel: Recent Labs  Lab 04/16/20 0731 04/17/20 0751 04/17/20 1825 04/18/20 0416 04/18/20 0416 04/18/20 1700 04/18/20 1700 04/19/20 0539 04/20/20 0422 04/20/20 1700 04/21/20 0400 04/22/20 0500  NA 149*   < >  --  149*   < > 151*   < > 149* 145 145 142 140  K 4.7    < >  --  4.0   < > 3.7   < > 3.5 3.7 3.6 3.7 3.6  CL 114*   < >  --  115*   < > 115*   < > 112* 107 104 100 98  CO2 22   < >  --  24   < > 27   < > 28 28 32 29 31  GLUCOSE 115*   < >  --  186*   < > 161*   < > 166* 151* 190* 130* 190*  BUN 33*   < >  --  38*   < > 41*   < > 34* 16 15 16 20   CREATININE 1.06*   < >  --  0.81   < > 0.72   < > 0.72 0.51 0.61 0.65 0.73  CALCIUM 8.7*   < >  --  8.8*   < > 8.6*   < > 8.5* 8.7* 9.0 9.0 8.8*  MG 2.0  --  2.2 2.4  --  2.3  --  2.2  --   --   --   --  PHOS  --   --  2.3* 2.0*  --  1.9*  --  2.9  --   --   --   --    < > = values in this interval not displayed.   GFR: Estimated Creatinine Clearance: 71.8 mL/min (by C-G formula based on SCr of 0.73 mg/dL). Recent Labs  Lab 04/17/20 0751 04/17/20 0751 04/18/20 0416 04/19/20 0539 04/20/20 0422 04/21/20 0400  PROCALCITON 0.47  --   --   --   --   --   WBC 6.1   < > 7.1 8.1 5.7 5.0   < > = values in this interval not displayed.    Liver Function Tests: No results for input(s): AST, ALT, ALKPHOS, BILITOT, PROT, ALBUMIN in the last 168 hours. Recent Labs  Lab 04/18/20 0940 04/21/20 0400  AMMONIA 76* 46*    CBG: Recent Labs  Lab 04/21/20 1459 04/21/20 1948 04/21/20 2315 04/22/20 0333 04/22/20 0724  GLUCAP 93 114* 114* 148* 167*    Past Medical History  She,  has a past medical history of Constipation, Dysphagia, Fracture, GERD (gastroesophageal reflux disease), History of shingles (10/2016), Mental retardation, Seizures ([place]), Thrombocytopenia ([place]), and Tremor.   Surgical History    Past Surgical History:  Procedure Laterality Date  . BIOPSY  09/16/2014   Procedure: BIOPSY;  Surgeon: Rogene Houston, MD;  Location: [facility];  Service: Endoscopy;;  . CATARACT EXTRACTION     both eyes, May of 2015  . COLONOSCOPY    . COLONOSCOPY WITH PROPOFOL N/A 11/20/2018   Procedure: COLONOSCOPY WITH PROPOFOL;  Surgeon: Rogene Houston, MD;  Location: [facility];  Service: Endoscopy;   Laterality: N/A;  . ESOPHAGEAL DILATION N/A 09/16/2014   Procedure: ESOPHAGEAL DILATION WITH 54FR MALONEY DILATOR;  Surgeon: Rogene Houston, MD;  Location: [facility];  Service: Endoscopy;  Laterality: N/A;  . ESOPHAGOGASTRODUODENOSCOPY (EGD) WITH PROPOFOL N/A 09/16/2014   Procedure: ESOPHAGOGASTRODUODENOSCOPY (EGD) WITH PROPOFOL;  Surgeon: Rogene Houston, MD;  Location: [facility];  Service: Endoscopy;  Laterality: N/A;  . MOUTH SURGERY    . POLYPECTOMY  11/20/2018   Procedure: POLYPECTOMY;  Surgeon: Rogene Houston, MD;  Location: [facility];  Service: Endoscopy;;  colon  . Skin graft to gum Right 08/2013  . TONSILLECTOMY AND ADENOIDECTOMY    . TOTAL ABDOMINAL HYSTERECTOMY       Social History   reports that she quit smoking about 13 years ago. Her smoking use included cigarettes. She has a 22.50 pack-year smoking history. She has never used smokeless tobacco. She reports that she does not drink alcohol and does not use drugs.   Family History   Her family history includes Diabetes in her father; High Cholesterol in her mother; High blood pressure in her mother.   Allergies Allergies  Allergen Reactions  . Codeine Nausea And Vomiting  . Lamictal [Lamotrigine] Rash     Home Medications  Prior to Admission medications   Medication Sig Start Date End Date Taking? Authorizing Provider  atorvastatin (LIPITOR) 20 MG tablet Take 20 mg by mouth daily. 08/24/18  Yes [provider]  clonazePAM (KLONOPIN) 0.5 MG tablet Take 1 tablet (0.5 mg total) by mouth 2 (two) times daily. 04/13/20  Yes Suzzanne Cloud, NP  divalproex (DEPAKOTE ER) 500 MG 24 hr tablet Take 1 tablet (500 mg total) by mouth 2 (two) times daily. 11/25/19  Yes Suzzanne Cloud, NP  ibuprofen (ADVIL) 200 MG tablet Take 400 mg by mouth  every 6 (six) hours as needed for moderate pain.   Yes [provider]  lacosamide (VIMPAT) 200 MG TABS tablet Take 1 tablet (200 mg total) by mouth 2 (two) times daily. 11/25/19  Yes  Suzzanne Cloud, NP  levocetirizine (XYZAL) 5 MG tablet Take 5 mg by mouth every evening.    Yes [provider]  omeprazole (PRILOSEC) 40 MG capsule Take 1 capsule (40 mg total) by mouth daily. 11/03/19  Yes Laurine Blazer A, PA-C  oxybutynin (DITROPAN-XL) 5 MG 24 hr tablet Take 5 mg by mouth at bedtime.    Yes [provider]  Pediatric Multiple Vit-C-FA (PEDIATRIC MULTIVITAMIN) chewable tablet Chew 1 tablet by mouth daily.     Yes [provider]

## 2020-04-22 NOTE — Progress Notes (Signed)
RT came into room to do CPT and pts sister reported that the pt had just vomited. CPT was held at this time. RN was made awrae.

## 2020-04-23 DIAGNOSIS — N12 Tubulo-interstitial nephritis, not specified as acute or chronic: Secondary | ICD-10-CM | POA: Diagnosis not present

## 2020-04-23 DIAGNOSIS — R569 Unspecified convulsions: Secondary | ICD-10-CM | POA: Diagnosis not present

## 2020-04-23 DIAGNOSIS — N179 Acute kidney failure, unspecified: Secondary | ICD-10-CM | POA: Diagnosis not present

## 2020-04-23 DIAGNOSIS — G9341 Metabolic encephalopathy: Secondary | ICD-10-CM | POA: Diagnosis not present

## 2020-04-23 DIAGNOSIS — R1310 Dysphagia, unspecified: Secondary | ICD-10-CM | POA: Diagnosis not present

## 2020-04-23 LAB — CBC
HCT: 26.7 % — ABNORMAL LOW (ref 36.0–46.0)
Hemoglobin: 8.5 g/dL — ABNORMAL LOW (ref 12.0–15.0)
MCH: 30.8 pg (ref 26.0–34.0)
MCHC: 31.8 g/dL (ref 30.0–36.0)
MCV: 96.7 fL (ref 80.0–100.0)
Platelets: 273 10*3/uL (ref 150–400)
RBC: 2.76 MIL/uL — ABNORMAL LOW (ref 3.87–5.11)
RDW: 15.2 % (ref 11.5–15.5)
WBC: 13 10*3/uL — ABNORMAL HIGH (ref 4.0–10.5)
nRBC: 0.3 % — ABNORMAL HIGH (ref 0.0–0.2)

## 2020-04-23 LAB — GLUCOSE, CAPILLARY
Glucose-Capillary: 108 mg/dL — ABNORMAL HIGH (ref 70–99)
Glucose-Capillary: 119 mg/dL — ABNORMAL HIGH (ref 70–99)
Glucose-Capillary: 123 mg/dL — ABNORMAL HIGH (ref 70–99)
Glucose-Capillary: 127 mg/dL — ABNORMAL HIGH (ref 70–99)
Glucose-Capillary: 129 mg/dL — ABNORMAL HIGH (ref 70–99)
Glucose-Capillary: 96 mg/dL (ref 70–99)

## 2020-04-23 LAB — BASIC METABOLIC PANEL
Anion gap: 8 (ref 5–15)
BUN: 14 mg/dL (ref 6–20)
CO2: 30 mmol/L (ref 22–32)
Calcium: 8.8 mg/dL — ABNORMAL LOW (ref 8.9–10.3)
Chloride: 101 mmol/L (ref 98–111)
Creatinine, Ser: 0.8 mg/dL (ref 0.44–1.00)
GFR, Estimated: >60 mL/min (ref >60)
Glucose, Bld: 134 mg/dL — ABNORMAL HIGH (ref 70–99)
Potassium: 3.5 mmol/L (ref 3.5–5.1)
Sodium: 139 mmol/L (ref 135–145)

## 2020-04-23 LAB — MAGNESIUM: Magnesium: 1.9 mg/dL (ref 1.7–2.4)

## 2020-04-23 MED ORDER — MELATONIN 5 MG PO TABS
5.0000 mg | ORAL_TABLET | Freq: Every day | ORAL | Status: DC
  Administered 2020-04-23 – 2020-04-26 (×4): 5 mg
  Filled 2020-04-23 (×6): qty 1

## 2020-04-23 MED ORDER — DOCUSATE SODIUM 50 MG/5ML PO LIQD
100.0000 mg | Freq: Two times a day (BID) | ORAL | Status: DC
  Administered 2020-04-23 (×2): 100 mg via ORAL
  Filled 2020-04-23 (×2): qty 10

## 2020-04-23 MED ORDER — CLONAZEPAM 0.5 MG PO TABS
0.5000 mg | ORAL_TABLET | Freq: Two times a day (BID) | ORAL | Status: DC
  Administered 2020-04-23 – 2020-04-24 (×2): 0.5 mg
  Filled 2020-04-23 (×2): qty 1

## 2020-04-23 MED ORDER — MAGNESIUM SULFATE 2 GM/50ML IV SOLN
2.0000 g | Freq: Once | INTRAVENOUS | Status: AC
  Administered 2020-04-23: 2 g via INTRAVENOUS
  Filled 2020-04-23: qty 50

## 2020-04-23 MED ORDER — LIP MEDEX EX OINT
TOPICAL_OINTMENT | CUTANEOUS | Status: DC | PRN
  Filled 2020-04-23: qty 7

## 2020-04-23 MED ORDER — POLYETHYLENE GLYCOL 3350 17 G PO PACK
17.0000 g | PACK | Freq: Two times a day (BID) | ORAL | Status: DC
  Administered 2020-04-23 (×2): 17 g via ORAL
  Filled 2020-04-23 (×2): qty 1

## 2020-04-23 MED ORDER — CLONAZEPAM 0.1 MG/ML ORAL SUSPENSION: 0.5000 mg | Freq: Every day | ORAL | Status: DC

## 2020-04-23 MED ORDER — POTASSIUM CHLORIDE 20 MEQ PO PACK
40.0000 meq | PACK | Freq: Once | ORAL | Status: AC
  Administered 2020-04-23: 40 meq
  Filled 2020-04-23: qty 2

## 2020-04-23 MED ORDER — POLYETHYLENE GLYCOL 3350 17 G PO PACK: 17.0000 g | PACK | Freq: Two times a day (BID) | ORAL | Status: DC

## 2020-04-23 MED ORDER — DEXTROSE IN LACTATED RINGERS 5 % IV SOLN: INTRAVENOUS | Status: DC

## 2020-04-23 MED ORDER — POLYETHYLENE GLYCOL 3350 17 G PO PACK: 17.0000 g | PACK | Freq: Every day | ORAL | Status: DC

## 2020-04-23 MED ORDER — FLUCONAZOLE 40 MG/ML PO SUSR
200.0000 mg | Freq: Once | ORAL | Status: AC
  Administered 2020-04-23: 200 mg
  Filled 2020-04-23: qty 5

## 2020-04-23 MED ORDER — ACETAMINOPHEN 160 MG/5ML PO SOLN
500.0000 mg | Freq: Four times a day (QID) | ORAL | Status: DC | PRN
  Administered 2020-04-23: 500 mg via ORAL
  Filled 2020-04-23: qty 20.3

## 2020-04-23 MED ORDER — CLONAZEPAM 0.5 MG PO TABS: 0.5000 mg | ORAL_TABLET | Freq: Every day | ORAL | Status: DC

## 2020-04-23 NOTE — Progress Notes (Signed)
CPT held due to pt out of bed and in the chair.

## 2020-04-23 NOTE — Progress Notes (Signed)
Subjective: Sitting up in bed. Sister at bedside. No clinical seizures overnight. Precedex being weaned this morning.   Objective: Current vital signs: BP (!) 153/136   Pulse 94   Temp 97.9 F (36.6 C) (Axillary)   Resp 19   Ht 5\' 6"  (1.676 m)   Wt 65.4 kg   SpO2 90%   BMI 23.27 kg/m  Vital signs in last 24 hours: Temp:  [96.8 F (36 C)-98.2 F (36.8 C)] 97.9 F (36.6 C) (11/21 0400) Pulse Rate:  [63-123] 94 (11/21 1005) Resp:  [13-24] 19 (11/21 1005) BP: (85-153)/(47-136) 153/136 (11/21 1005) SpO2:  [90 %-100 %] 90 % (11/21 1005) Weight:  [65.4 kg] 65.4 kg (11/21 0500)  Intake/Output from previous day: 11/20 0701 - 11/21 0700 In: 2279 [I.V.:2078.9; IV Piggyback:200.1] Out: 1375 [Urine:1375] Intake/Output this shift: Total I/O In: 581.6 [I.V.:471.6; NG/GT:60; IV Piggyback:50] Out: -  Nutritional status:  Diet Order            Diet NPO time specified Except for: Ice Chips  Diet effective now                General: lying in bed, not in apparent distress CVS: pulse-normal rate and rhythm RS: breathing comfortably Extremities: normal, warm to touch with brisk capillary refill.   Neurologic Exam: Ment: Much more bright this morning than prior exam. Conversant. Oriented to person, place, and time. Able to follow commands, including two step commands at this time.  CN: PERRLA, no gaze deviation, no apparent facial asymmetry Motor/Sensory: Moves extremities spontaneously and to command. However, she is globally weak.   Lab Results: Results for orders placed or performed during the hospital encounter of 04/14/20 (from the past 48 hour(s))  Glucose, capillary     Status: Abnormal   Collection Time: 04/21/20 11:04 AM  Result Value Ref Range   Glucose-Capillary 112 (H) 70 - 99 mg/dL    Comment: Glucose reference range applies only to samples taken after fasting for at least 8 hours.  Glucose, capillary     Status: None   Collection Time: 04/21/20  2:59 PM  Result  Value Ref Range   Glucose-Capillary 93 70 - 99 mg/dL    Comment: Glucose reference range applies only to samples taken after fasting for at least 8 hours.  Glucose, capillary     Status: Abnormal   Collection Time: 04/21/20  7:48 PM  Result Value Ref Range   Glucose-Capillary 114 (H) 70 - 99 mg/dL    Comment: Glucose reference range applies only to samples taken after fasting for at least 8 hours.  Glucose, capillary     Status: Abnormal   Collection Time: 04/21/20 11:15 PM  Result Value Ref Range   Glucose-Capillary 114 (H) 70 - 99 mg/dL    Comment: Glucose reference range applies only to samples taken after fasting for at least 8 hours.  Glucose, capillary     Status: Abnormal   Collection Time: 04/22/20  3:33 AM  Result Value Ref Range   Glucose-Capillary 148 (H) 70 - 99 mg/dL    Comment: Glucose reference range applies only to samples taken after fasting for at least 8 hours.  Basic metabolic panel     Status: Abnormal   Collection Time: 04/22/20  5:00 AM  Result Value Ref Range   Sodium 140 135 - 145 mmol/L   Potassium 3.6 3.5 - 5.1 mmol/L   Chloride 98 98 - 111 mmol/L   CO2 31 22 - 32 mmol/L   Glucose,  Bld 190 (H) 70 - 99 mg/dL    Comment: Glucose reference range applies only to samples taken after fasting for at least 8 hours.   BUN 20 6 - 20 mg/dL   Creatinine, Ser 0.73 0.44 - 1.00 mg/dL   Calcium 8.8 (L) 8.9 - 10.3 mg/dL   GFR, Estimated >60 >60 mL/min    Comment: (NOTE) Calculated using the CKD-EPI Creatinine Equation (2021)    Anion gap 11 5 - 15    Comment: Performed at [facility] [address], [place], [place] [address]  Glucose, capillary     Status: Abnormal   Collection Time: 04/22/20  7:24 AM  Result Value Ref Range   Glucose-Capillary 167 (H) 70 - 99 mg/dL    Comment: Glucose reference range applies only to samples taken after fasting for at least 8 hours.  Glucose, capillary     Status: Abnormal   Collection Time: 04/22/20 11:10 AM  Result  Value Ref Range   Glucose-Capillary 125 (H) 70 - 99 mg/dL    Comment: Glucose reference range applies only to samples taken after fasting for at least 8 hours.  Glucose, capillary     Status: Abnormal   Collection Time: 04/22/20  3:19 PM  Result Value Ref Range   Glucose-Capillary 115 (H) 70 - 99 mg/dL    Comment: Glucose reference range applies only to samples taken after fasting for at least 8 hours.  Glucose, capillary     Status: None   Collection Time: 04/22/20  8:49 PM  Result Value Ref Range   Glucose-Capillary 95 70 - 99 mg/dL    Comment: Glucose reference range applies only to samples taken after fasting for at least 8 hours.  Glucose, capillary     Status: Abnormal   Collection Time: 04/22/20 11:37 PM  Result Value Ref Range   Glucose-Capillary 105 (H) 70 - 99 mg/dL    Comment: Glucose reference range applies only to samples taken after fasting for at least 8 hours.  Glucose, capillary     Status: Abnormal   Collection Time: 04/23/20  3:30 AM  Result Value Ref Range   Glucose-Capillary 123 (H) 70 - 99 mg/dL    Comment: Glucose reference range applies only to samples taken after fasting for at least 8 hours.  CBC     Status: Abnormal   Collection Time: 04/23/20  4:11 AM  Result Value Ref Range   WBC 13.0 (H) 4.0 - 10.5 K/uL   RBC 2.76 (L) 3.87 - 5.11 MIL/uL   Hemoglobin 8.5 (L) 12.0 - 15.0 g/dL   HCT 26.7 (L) 36 - 46 %   MCV 96.7 80.0 - 100.0 fL   MCH 30.8 26.0 - 34.0 pg   MCHC 31.8 30.0 - 36.0 g/dL   RDW 15.2 11.5 - 15.5 %   Platelets 273 150 - 400 K/uL   nRBC 0.3 (H) 0.0 - 0.2 %    Comment: Performed at [facility] [address], [place], [place] [address]  Basic metabolic panel     Status: Abnormal   Collection Time: 04/23/20  4:11 AM  Result Value Ref Range   Sodium 139 135 - 145 mmol/L   Potassium 3.5 3.5 - 5.1 mmol/L   Chloride 101 98 - 111 mmol/L   CO2 30 22 - 32 mmol/L   Glucose, Bld 134 (H) 70 - 99 mg/dL    Comment: Glucose reference range  applies only to samples taken after fasting for at  least 8 hours.   BUN 14 6 - 20 mg/dL   Creatinine, Ser 0.80 0.44 - 1.00 mg/dL   Calcium 8.8 (L) 8.9 - 10.3 mg/dL   GFR, Estimated >60 >60 mL/min    Comment: (NOTE) Calculated using the CKD-EPI Creatinine Equation (2021)    Anion gap 8 5 - 15    Comment: Performed at [facility] [address] [place], [place] [address]  Magnesium     Status: None   Collection Time: 04/23/20  4:11 AM  Result Value Ref Range   Magnesium 1.9 1.7 - 2.4 mg/dL    Comment: Performed at [facility] [address] [place], [place] [address]  Glucose, capillary     Status: Abnormal   Collection Time: 04/23/20  9:03 AM  Result Value Ref Range   Glucose-Capillary 119 (H) 70 - 99 mg/dL    Comment: Glucose reference range applies only to samples taken after fasting for at least 8 hours.    Recent Results (from the past 240 hour(s))  Urine culture     Status: Abnormal   Collection Time: 04/14/20 11:20 AM   Specimen: Urine, Clean Catch  Result Value Ref Range Status   Specimen Description   Final    URINE, CLEAN CATCH Performed at [facility], [address] [place], [place] [address]    Special Requests   Final    NONE Performed at [facility], [address], [place], [place] [address]    Culture 50,000 COLONIES/mL ESCHERICHIA COLI (A)  Final   Report Status 04/17/2020 FINAL  Final   Organism ID, Bacteria ESCHERICHIA COLI (A)  Final      Susceptibility   Escherichia coli - MIC*    AMPICILLIN <=2 SENSITIVE Sensitive     CEFAZOLIN <=4 SENSITIVE Sensitive     CEFEPIME <=0.12 SENSITIVE Sensitive     CEFTRIAXONE <=0.25 SENSITIVE Sensitive     CIPROFLOXACIN >=4 RESISTANT Resistant     GENTAMICIN <=1 SENSITIVE Sensitive     IMIPENEM <=0.25 SENSITIVE Sensitive     NITROFURANTOIN 128 RESISTANT Resistant     TRIMETH/SULFA >=320 RESISTANT Resistant     AMPICILLIN/SULBACTAM <=2 SENSITIVE Sensitive     PIP/TAZO <=4 SENSITIVE Sensitive     *  50,000 COLONIES/mL ESCHERICHIA COLI  Culture, blood (routine x 2)     Status: None   Collection Time: 04/14/20  1:58 PM   Specimen: Blood  Result Value Ref Range Status   Specimen Description BLOOD LEFT ARM  Final   Special Requests   Final    BOTTLES DRAWN AEROBIC AND ANAEROBIC Blood Culture adequate volume   Culture   Final    NO GROWTH 5 DAYS Performed at [facility], [address] [place], [place] [address]    Report Status 04/19/2020 FINAL  Final  Culture, blood (routine x 2)     Status: None   Collection Time: 04/14/20  2:08 PM   Specimen: Blood  Result Value Ref Range Status   Specimen Description BLOOD LEFT WRIST  Final   Special Requests   Final    BOTTLES DRAWN AEROBIC AND ANAEROBIC Blood Culture adequate volume   Culture   Final    NO GROWTH 5 DAYS Performed at [facility], [address] [place], [place] [address]    Report Status 04/19/2020 FINAL  Final  Respiratory Panel by RT PCR (Flu A&B, Covid) - Nasopharyngeal Swab     Status: None   Collection Time: 04/14/20  2:12  PM   Specimen: Nasopharyngeal Swab  Result Value Ref Range Status   SARS Coronavirus 2 by RT PCR NEGATIVE NEGATIVE Final    Comment: (NOTE) SARS-CoV-2 target nucleic acids are NOT DETECTED.  The SARS-CoV-2 RNA is generally detectable in upper respiratoy specimens during the acute phase of infection. The lowest concentration of SARS-CoV-2 viral copies this assay can detect is 131 copies/mL. A negative result does not preclude SARS-Cov-2 infection and should not be used as the sole basis for treatment or other patient management decisions. A negative result may occur with  improper specimen collection/handling, submission of specimen other than nasopharyngeal swab, presence of viral mutation(s) within the areas targeted by this assay, and inadequate number of viral copies (<131 copies/mL). A negative result must be combined with clinical observations, patient history, and epidemiological  information. The expected result is Negative.  Fact Sheet for Patients:  PinkCheek.be  Fact Sheet for Healthcare Providers:  GravelBags.it  This test is no t yet approved or cleared by the [place] FDA and  has been authorized for detection and/or diagnosis of SARS-CoV-2 by FDA under an Emergency Use Authorization (EUA). This EUA will remain  in effect (meaning this test can be used) for the duration of the COVID-19 declaration under Section 564(b)(1) of the Act, 21 U.S.C. section 360bbb-3(b)(1), unless the authorization is terminated or revoked sooner.     Influenza A by PCR NEGATIVE NEGATIVE Final   Influenza B by PCR NEGATIVE NEGATIVE Final    Comment: (NOTE) The Xpert Xpress SARS-CoV-2/FLU/RSV assay is intended as an aid in  the diagnosis of influenza from Nasopharyngeal swab specimens and  should not be used as a sole basis for treatment. Nasal washings and  aspirates are unacceptable for Xpert Xpress SARS-CoV-2/FLU/RSV  testing.  Fact Sheet for Patients: PinkCheek.be  Fact Sheet for Healthcare Providers: GravelBags.it  This test is not yet approved or cleared by the [place] FDA and  has been authorized for detection and/or diagnosis of SARS-CoV-2 by  FDA under an Emergency Use Authorization (EUA). This EUA will remain  in effect (meaning this test can be used) for the duration of the  Covid-19 declaration under Section 564(b)(1) of the Act, 21  U.S.C. section 360bbb-3(b)(1), unless the authorization is  terminated or revoked. Performed at [facility], [address], [place], [place] [address]   Respiratory Panel by RT PCR (Flu A&B, Covid) - Nasal Mucosa     Status: None   Collection Time: 04/16/20 11:37 AM   Specimen: Nasal Mucosa; Nasopharyngeal  Result Value Ref Range Status   SARS Coronavirus 2 by RT PCR NEGATIVE NEGATIVE Final     Comment: (NOTE) SARS-CoV-2 target nucleic acids are NOT DETECTED.  The SARS-CoV-2 RNA is generally detectable in upper respiratoy specimens during the acute phase of infection. The lowest concentration of SARS-CoV-2 viral copies this assay can detect is 131 copies/mL. A negative result does not preclude SARS-Cov-2 infection and should not be used as the sole basis for treatment or other patient management decisions. A negative result may occur with  improper specimen collection/handling, submission of specimen other than nasopharyngeal swab, presence of viral mutation(s) within the areas targeted by this assay, and inadequate number of viral copies (<131 copies/mL). A negative result must be combined with clinical observations, patient history, and epidemiological information. The expected result is Negative.  Fact Sheet for Patients:  PinkCheek.be  Fact Sheet for Healthcare Providers:  GravelBags.it  This test is no t yet approved or cleared by the  [place] [employer]ural [employer] and  has been authorized for detection and/or diagnosis of SARS-CoV-2 by FDA under an Print production planner (EUA). This EUA will remain  in effect (meaning this test can be used) for the duration of the COVID-19 declaration under Section 564(b)(1) of the Act, 21 U.S.C. section 360bbb-3(b)(1), unless the authorization is terminated or revoked sooner.     Influenza A by PCR NEGATIVE NEGATIVE Final   Influenza B by PCR NEGATIVE NEGATIVE Final    Comment: (NOTE) The Xpert Xpress SARS-CoV-2/FLU/RSV assay is intended as an aid in  the diagnosis of influenza from Nasopharyngeal swab specimens and  should not be used as a sole basis for treatment. Nasal washings and  aspirates are unacceptable for Xpert Xpress SARS-CoV-2/FLU/RSV  testing.  Fact Sheet for Patients: PinkCheek.be  Fact Sheet for Healthcare  Providers: GravelBags.it  This test is not yet approved or cleared by the [place] FDA and  has been authorized for detection and/or diagnosis of SARS-CoV-2 by  FDA under an Emergency Use Authorization (EUA). This EUA will remain  in effect (meaning this test can be used) for the duration of the  Covid-19 declaration under Section 564(b)(1) of the Act, 21  U.S.C. section 360bbb-3(b)(1), unless the authorization is  terminated or revoked. Performed at [facility], [address] [place], [place] [address]   MRSA PCR Screening     Status: None   Collection Time: 04/16/20 11:37 AM   Specimen: Nasal Mucosa; Nasopharyngeal  Result Value Ref Range Status   MRSA by PCR NEGATIVE NEGATIVE Final    Comment:        The GeneXpert MRSA Assay (FDA approved for NASAL specimens only), is one component of a comprehensive MRSA colonization surveillance program. It is not intended to diagnose MRSA infection nor to guide or monitor treatment for MRSA infections. Performed at [facility], [address], [place], [place] [address]     Lipid Panel No results for input(s): CHOL, TRIG, HDL, CHOLHDL, VLDL, LDLCALC in the last 72 hours.  Studies/Results: DG Chest Port 1 View  Result Date: 04/22/2020 CLINICAL DATA:  Acute respiratory failure, aspiration airway. EXAM: PORTABLE CHEST 1 VIEW COMPARISON:  April 21, 2020. FINDINGS: Bibasilar opacities. Left basilar consolidation silhouetting left hemidiaphragm, slightly progressed. No visible pleural effusions or pneumothorax. Similar cardiomediastinal silhouette. Right PICC tip projects at the right atrium, unchanged. Enteric tube courses below the diaphragm outside the field of view. No acute osseous abnormality. IMPRESSION: Bibasilar airspace opacities with slightly progressed dense consolidation at the left lung base. Findings are concerning for pneumonia and/or aspiration. Electronically Signed   By: Margaretha Sheffield  MD   On: 04/22/2020 09:12   DG CHEST PORT 1 VIEW  Result Date: 04/21/2020 CLINICAL DATA:  59 year old female with hypoxia. EXAM: PORTABLE CHEST 1 VIEW COMPARISON:  Portable chest 04/16/2020 and earlier. FINDINGS: Portable AP semi upright view at 1043 hours. Right PICC line and enteric feeding tube and been placed. PICC line tip projects at the right atrium. Feeding tube courses into the left abdomen, tip not included. No endotracheal tube identified. Lower lung volumes with increasing bibasilar opacity including some retrocardiac air bronchograms now. However, right central lung and perihilar ventilation has improved. No superimposed pneumothorax or pulmonary edema. No definite pleural effusion. No acute osseous abnormality identified. Paucity of bowel gas in the upper abdomen. IMPRESSION: 1. Lower lung volumes and increasing bibasilar opacity, left lower lobe consolidation suspected now. 2. However, right mid lung and perihilar ventilation has improved. 3. Right PICC  line and enteric feeding tube placed. Electronically Signed   By: Genevie Ann M.D.   On: 04/21/2020 11:08    Medications:  Scheduled: . atorvastatin  20 mg Per Tube QPM  . chlorhexidine  15 mL Mouth Rinse BID  . Chlorhexidine Gluconate Cloth  6 each Topical Daily  . docusate  100 mg Oral BID  . enoxaparin (LOVENOX) injection  40 mg Subcutaneous Q24H  . feeding supplement (PROSource TF)  45 mL Per Tube BID  . fluconazole  200 mg Per Tube Once  . free water  200 mL Per Tube Q3H while awake  . insulin aspart  0-9 Units Subcutaneous Q4H  . LORazepam  1 mg Intravenous Q8H  . mouth rinse  15 mL Mouth Rinse q12n4p  . metoCLOPramide (REGLAN) injection  10 mg Intravenous Q6H  . multivitamin with minerals  1 tablet Per Tube Daily  . pantoprazole (PROTONIX) IV  40 mg Intravenous QHS  . polyethylene glycol  17 g Oral BID  . QUEtiapine  50 mg Per Tube BID  . sodium chloride flush  10-40 mL Intracatheter Q12H  . sodium chloride HYPERTONIC  4  mL Nebulization BID   Continuous: . sodium chloride 10 mL/hr at 04/23/20 1000  . cefTRIAXone (ROCEPHIN)  IV 2 g (04/22/20 1444)  . dexmedetomidine (PRECEDEX) IV infusion 0.6 mcg/kg/hr (04/23/20 1000)  . dextrose 5% lactated ringers 75 mL/hr at 04/23/20 1000  . feeding supplement (OSMOLITE 1.5 CAL) Stopped (04/20/20 2145)  . lacosamide (VIMPAT) IV 200 mg (04/22/20 2235)  . valproate sodium 500 mg (04/22/20 2116)    Assessment: 59 year old female with history of intellectual disability, epilepsy who was admitted for abdominal pain and was found to have sepsis with pyelonephritis. Initially while in the hospital she was noted to have seizures. No further seizures at this time.  -Epilepsy with breakthrough seizures -Sepsis with pyelonephritis -Hyperammonemia. Ammonia elevated at 46  On 11/19. -Microcytic anemia - LTM discontinued today given no seizure activity seen.  - LTM EEG report for yesterday AM:  Generalized spike and wave discharges were also noted, at times lasting 2-5 seconds consistent with brief-ictal-interictal rhythmic discharges ( BIRDS). This studyis consistent with patient's known history of generalized epilepsy. Additionally there is evidence ofmild to moderate diffuse encephalopathy,non specific etiology. No seizures were seen during this study.  Recommendations: - Continue valproic acid 500 mg twice daily, Vimpat 200 mg twice daily,  - Of note, it is possible that patient at baseline has brief ictal-interictal discharges.  Therefore, will not increase AEDs unless patient has clinical seizures or persistent altered mental status. - Seizure precautions - PRNIV Ativan 2 mg for clinical etiology more than 2 minutes or multiple seizures without return to baseline - Precedex being weaned this AM - Management of rest of comorbidities per primary team - No further inpatient neurologic workup indicated, and we will sign off at this time but remain available to assist. Follow  up with outpatient Neurologist.    LOS: 9 days   @Electronically  signed: Dr. Kerney Elbe 04/23/2020  10:28 AM

## 2020-04-23 NOTE — Progress Notes (Signed)
[facility] ADULT ICU REPLACEMENT PROTOCOL   The patient does apply for the [facility] Adult ICU Electrolyte Replacment Protocol based on the criteria listed below:   1. Is GFR >/= 30 ml/min? Yes.    Patient's GFR today is >60 2. Is SCr </= 2? Yes.   Patient's SCr is 0.80 ml/kg/hr 3. Did SCr increase >/= 0.5 in 24 hours? No. 4. Abnormal electrolyte(s): K+3.5 Mag 1.9 5. Ordered repletion with: protocol 6. If a panic level lab has been reported, has the CCM MD in charge been notified? Yes.  .   Physician:  Dr.Sommer  Carlisle Beers 04/23/2020 5:54 AM

## 2020-04-23 NOTE — Progress Notes (Signed)
NAME:  Maria [name], MRN:  [id number], DOB:  03-30-1961, LOS: 9 ADMISSION DATE:  04/14/2020, CONSULTATION DATE:  04/16/20 REFERRING MD:  Forestine Na, Dr. Wynetta Emery, CHIEF COMPLAINT:  sepsis   Brief History   59 year old woman with hx of epilepsy, intellectual disability, GERD, here with sepsis from pyelonephritis complicated by delirium and seizures.  Past Medical History  Intellectual disability Epilepsy Chronic constipation Dysphagia  Significant Hospital Events   11/12 admitted w/ septic shock and acute TME 11/15 PICC and CorTrak placed.  11/16 off pressors. Still agitated and delirious. On mono rx for ecoli 11/18 Seizure loaded on keppra Consults:  Neurology  Procedures:  RUE PICC 11/15  Significant Diagnostic Tests:  CT abdomen:  Findings suspicious for pyelonephritis and small abscess in lower pole of left kidney. Suggest correlation with urinalysis, and recommend continued follow-up by CT to confirm resolution. No evidence of ureteral calculi or hydronephrosis.  EEG: - Seizure, generalized - BIRDs - Spike and wave, generalized - Continuous slow, generalized  EKG: NSR Qtc 460 Micro Data:  Urine cutlure:  (pansens) Ecoli  Blood culture: Negative  Antimicrobials:  Ceftriaxone 2g 11/12 --> Azithro 11/14 --11/15 Zosyn 11/14 --> 11/15  Interim history/subjective:  Overnight stable delirium. She is more awake and conversant this morning. Having discomfort with external female catheter.  Wanting to get out of bed.  Objective   Blood pressure (!) 91/55, pulse 63, temperature 97.9 F (36.6 C), temperature source Axillary, resp. rate 14, height 5\' 6"  (1.676 m), weight 65.4 kg, SpO2 99 %.        Intake/Output Summary (Last 24 hours) at 04/23/2020 0951 Last data filed at 04/23/2020 0500 Gross per 24 hour  Intake 2062.59 ml  Output 1245 ml  Net 817.59 ml   Filed Weights   04/19/20 0359 04/20/20 0324 04/23/20 0500  Weight: 69.5 kg 70.2 kg 65.4 kg     Examination: General: caucasian female, chronically ill appearing Pulm: No tachypnea, few rhonchi with inspiration, no wheezes Card: RRR, no murmurs abd: Soft, active BS, no masses Derm diffuse petechial rash no edema  Neuro: Awake, alert, delirious but redirectable and follows commands  Resolved Hospital Problem list     Assessment & Plan:  Maria [name] is a 59 y.o. woman   Acute metabolic encephalopathy complicated by baseline intellectual disability & Hx of epilepsy Breakthrough seizures ICU delirium - No further seizures since 11/18.  - Neurology consulted, greatly appreciated reccomendations - Continue depakote 500 mg BID, Vimpat 200 mg BID. She is getting these IV currently due to NPO status. seroquel was resumed enterally.  If she consistently tolerates we will add back seizure medications enterally as well. - continue Clonazepam 0.5mg  TID - Ativan 2 mg PRN for seizures -Continue haldol 5 mg prn and taper down precedex today.  We will get out of bed to chair.  Reorient establish day/night routine as able. Sister at bedside which should help.   Acute hypoxic respiratory failure secondary to aspiration pneumonitis - wean oxygen as tolerated goal saturations over 90%.  - suction and aspiration precautions -All are eating ice chips, will rechallenge with trickle tube feeds today.  Constipation Patient with multiple episodes of emesis overnight. Started on lactulose for hyperammonemia with minimal stool output. -Scheduled MiraLAX and Colace  Moderate Protein Calorie malnutrition Plan Continue scheduled Reglan, will trickle back to peds today  Septic shock secondary to E Coli Pyelonephritis, question of small associated abscess Shock resolved - continue ceftriaxone for total 14 day course.  Best practice:  Diet: Trickle tube feeds Pain/Anxiety/Delirium protocol (if indicated): wean precedex to off today.  VAP protocol (if indicated): N/A DVT prophylaxis:  lovenox 40mg  daily GI prophylaxis: Protonix  Glucose control: ISS Mobility: PT  Code Status: Full  Family Communication: Discussed with sister at bedside suspect that she is through the worst of her delirium and just needs a little more time.  She can leave the ICU once she is off Precedex consistently. Disposition: ICU  The patient is critically ill with multiple organ systems failure and requires high complexity decision making for assessment and support, frequent evaluation and titration of therapies, application of advanced monitoring technologies and extensive interpretation of multiple databases.   Critical Care Time devoted to patient care services described in this note is 35 minutes. This time reflects time of care of this [place] . This critical care time does not reflect separately billable procedures or procedure time, teaching time or supervisory time of PA/NP/Med student/Med Resident etc but could involve care discussion time.  [facility] 04/23/2020 9:51 AM  Pager: [phone] After hours pager: [phone]   Labs   CBC: Recent Labs  Lab 04/18/20 0416 04/18/20 0416 04/18/20 0940 04/19/20 0539 04/20/20 0422 04/21/20 0400 04/23/20 0411  WBC 7.1  --   --  8.1 5.7 5.0 13.0*  NEUTROABS 5.4  --   --  4.8 4.1  --   --   HGB 9.6*  --   --  9.6* 9.2* 10.4* 8.5*  HCT 30.8*  --   --  30.8* 29.1* 32.5* 26.7*  MCV 100.3*  --   --  100.7* 100.7* 97.3 96.7  PLT 146*   < > 131* 181 200 231 273   < > = values in this interval not displayed.    Basic Metabolic Panel: Recent Labs  Lab 04/17/20 0751 04/17/20 1825 04/18/20 0416 04/18/20 0416 04/18/20 1700 04/18/20 1700 04/19/20 0539 04/19/20 0539 04/20/20 0422 04/20/20 1700 04/21/20 0400 04/22/20 0500 04/23/20 0411  NA   < >  --  149*   < > 151*   < > 149*   < > 145 145 142 140 139  K   < >  --  4.0   < > 3.7   < > 3.5   < > 3.7 3.6 3.7 3.6 3.5  CL   <  >  --  115*   < > 115*   < > 112*   < > 107 104 100 98 101  CO2   < >  --  24   < > 27   < > 28   < > 28 32 29 31 30   GLUCOSE   < >  --  186*   < > 161*   < > 166*   < > 151* 190* 130* 190* 134*  BUN   < >  --  38*   < > 41*   < > 34*   < > 16 15 16 20 14   CREATININE   < >  --  0.81   < > 0.72   < > 0.72   < > 0.51 0.61 0.65 0.73 0.80  CALCIUM   < >  --  8.8*   < > 8.6*   < > 8.5*   < > 8.7* 9.0 9.0 8.8* 8.8*  MG  --  2.2 2.4  --  2.3  --  2.2  --   --   --   --   --  1.9  PHOS  --  2.3* 2.0*  --  1.9*  --  2.9  --   --   --   --   --   --    < > = values in this interval not displayed.   GFR: Estimated Creatinine Clearance: 71.8 mL/min (by C-G formula based on SCr of 0.8 mg/dL). Recent Labs  Lab 04/17/20 0751 04/18/20 0416 04/19/20 0539 04/20/20 0422 04/21/20 0400 04/23/20 0411  PROCALCITON 0.47  --   --   --   --   --   WBC 6.1   < > 8.1 5.7 5.0 13.0*   < > = values in this interval not displayed.    Liver Function Tests: No results for input(s): AST, ALT, ALKPHOS, BILITOT, PROT, ALBUMIN in the last 168 hours. Recent Labs  Lab 04/18/20 0940 04/21/20 0400  AMMONIA 76* 46*    CBG: Recent Labs  Lab 04/22/20 1519 04/22/20 2049 04/22/20 2337 04/23/20 0330 04/23/20 0903  GLUCAP 115* 95 105* 123* 119*    Past Medical History  She,  has a past medical history of Constipation, Dysphagia, Fracture, GERD (gastroesophageal reflux disease), History of shingles (10/2016), Mental retardation, Seizures ([place]), Thrombocytopenia ([place]), and Tremor.   Surgical History    Past Surgical History:  Procedure Laterality Date  . BIOPSY  09/16/2014   Procedure: BIOPSY;  Surgeon: Rogene Houston, MD;  Location: [facility];  Service: Endoscopy;;  . CATARACT EXTRACTION     both eyes, May of 2015  . COLONOSCOPY    . COLONOSCOPY WITH PROPOFOL N/A 11/20/2018   Procedure: COLONOSCOPY WITH PROPOFOL;  Surgeon: Rogene Houston, MD;  Location: [facility];  Service: Endoscopy;  Laterality: N/A;  .  ESOPHAGEAL DILATION N/A 09/16/2014   Procedure: ESOPHAGEAL DILATION WITH 54FR MALONEY DILATOR;  Surgeon: Rogene Houston, MD;  Location: [facility];  Service: Endoscopy;  Laterality: N/A;  . ESOPHAGOGASTRODUODENOSCOPY (EGD) WITH PROPOFOL N/A 09/16/2014   Procedure: ESOPHAGOGASTRODUODENOSCOPY (EGD) WITH PROPOFOL;  Surgeon: Rogene Houston, MD;  Location: [facility];  Service: Endoscopy;  Laterality: N/A;  . MOUTH SURGERY    . POLYPECTOMY  11/20/2018   Procedure: POLYPECTOMY;  Surgeon: Rogene Houston, MD;  Location: [facility];  Service: Endoscopy;;  colon  . Skin graft to gum Right 08/2013  . TONSILLECTOMY AND ADENOIDECTOMY    . TOTAL ABDOMINAL HYSTERECTOMY       Social History   reports that she quit smoking about 13 years ago. Her smoking use included cigarettes. She has a 22.50 pack-year smoking history. She has never used smokeless tobacco. She reports that she does not drink alcohol and does not use drugs.   Family History   Her family history includes Diabetes in her father; High Cholesterol in her mother; High blood pressure in her mother.   Allergies Allergies  Allergen Reactions  . Codeine Nausea And Vomiting  . Lamictal [Lamotrigine] Rash     Home Medications  Prior to Admission medications   Medication Sig Start Date End Date Taking? Authorizing Provider  atorvastatin (LIPITOR) 20 MG tablet Take 20 mg by mouth daily. 08/24/18  Yes [provider]  clonazePAM (KLONOPIN) 0.5 MG tablet Take 1 tablet (0.5 mg total) by mouth 2 (two) times daily. 04/13/20  Yes Suzzanne Cloud, NP  divalproex (DEPAKOTE ER) 500 MG 24 hr tablet Take 1 tablet (500 mg total) by mouth 2 (two) times daily. 11/25/19  Yes Suzzanne Cloud, NP  ibuprofen (ADVIL) 200 MG tablet  Take 400 mg by mouth every 6 (six) hours as needed for moderate pain.   Yes [provider]  lacosamide (VIMPAT) 200 MG TABS tablet Take 1 tablet (200 mg total) by mouth 2 (two) times daily. 11/25/19  Yes Suzzanne Cloud, NP   levocetirizine (XYZAL) 5 MG tablet Take 5 mg by mouth every evening.    Yes [provider]  omeprazole (PRILOSEC) 40 MG capsule Take 1 capsule (40 mg total) by mouth daily. 11/03/19  Yes Laurine Blazer A, PA-C  oxybutynin (DITROPAN-XL) 5 MG 24 hr tablet Take 5 mg by mouth at bedtime.    Yes [provider]  Pediatric Multiple Vit-C-FA (PEDIATRIC MULTIVITAMIN) chewable tablet Chew 1 tablet by mouth daily.     Yes [provider]

## 2020-04-23 NOTE — Progress Notes (Signed)
eLink Physician-Brief Progress Note Patient Name: Maria [name] DOB: [birth year] MRN: [id number]   Date of Service  04/23/2020  HPI/Events of Note  Trickle feeds started today at 10 mL/hour. Blood glucose = 127.  eICU Interventions  PlanL 1. Decrease D5 LR IV infusion rate to 50 mL/hour.      Intervention Category Major Interventions: Hyperglycemia - active titration of insulin therapy  Lysle Dingwall 04/23/2020, 10:32 PM

## 2020-04-23 NOTE — Progress Notes (Signed)
Pt transitioned from HFNC @ 15L to 3L [employer]. Up to chair after bath.  Extremely weak and unable to safely follow commands.  Consistently attempting to get out of the chair so placed back in bed.  Re ordered PT eval and treat.  Pt's sister states ambulated independently PTA.   Weaned Precedex to 0.3 but d/t increasing "delerium" in afternoon and sister's request to change Ativan to PRN, requested Klonopin - which pt had as home med per sister 0.5 BID given 10a and 10p with depakote and vimpat.  MD ordered.  Precedex titrated up until sched Klonopin can be given tonight.  To wean Precedex off after Klonopin is started. Pt tolerated all PO meds today along with ice chips.  Restarted tube feeds @ trickle for now d/t emesis and aspiration this admission.  Bowel sounds hypoactive still and no BM today.  MD restarted bowel regimen.

## 2020-04-24 ENCOUNTER — Inpatient Hospital Stay (HOSPITAL_COMMUNITY): Payer: Medicare Other

## 2020-04-24 DIAGNOSIS — N12 Tubulo-interstitial nephritis, not specified as acute or chronic: Secondary | ICD-10-CM | POA: Diagnosis not present

## 2020-04-24 DIAGNOSIS — L8990 Pressure ulcer of unspecified site, unspecified stage: Secondary | ICD-10-CM | POA: Insufficient documentation

## 2020-04-24 LAB — GLUCOSE, CAPILLARY
Glucose-Capillary: 104 mg/dL — ABNORMAL HIGH (ref 70–99)
Glucose-Capillary: 153 mg/dL — ABNORMAL HIGH (ref 70–99)
Glucose-Capillary: 134 mg/dL — ABNORMAL HIGH (ref 70–99)
Glucose-Capillary: 135 mg/dL — ABNORMAL HIGH (ref 70–99)
Glucose-Capillary: 108 mg/dL — ABNORMAL HIGH (ref 70–99)
Glucose-Capillary: 139 mg/dL — ABNORMAL HIGH (ref 70–99)

## 2020-04-24 LAB — BASIC METABOLIC PANEL
Anion gap: 18 — ABNORMAL HIGH (ref 5–15)
BUN: 12 mg/dL (ref 6–20)
CO2: 27 mmol/L (ref 22–32)
Calcium: 9.3 mg/dL (ref 8.9–10.3)
Chloride: 96 mmol/L — ABNORMAL LOW (ref 98–111)
Creatinine, Ser: 0.87 mg/dL (ref 0.44–1.00)
GFR, Estimated: >60 mL/min (ref >60)
Glucose, Bld: 153 mg/dL — ABNORMAL HIGH (ref 70–99)
Potassium: 3.4 mmol/L — ABNORMAL LOW (ref 3.5–5.1)
Sodium: 141 mmol/L (ref 135–145)

## 2020-04-24 LAB — MAGNESIUM: Magnesium: 1.7 mg/dL (ref 1.7–2.4)

## 2020-04-24 MED ORDER — BISACODYL 10 MG RE SUPP: 10.0000 mg | Freq: Every day | RECTAL | Status: DC | PRN

## 2020-04-24 MED ORDER — DOCUSATE SODIUM 50 MG/5ML PO LIQD
100.0000 mg | Freq: Two times a day (BID) | ORAL | Status: DC
  Administered 2020-04-24 – 2020-05-02 (×10): 100 mg
  Filled 2020-04-24 (×15): qty 10

## 2020-04-24 MED ORDER — LACOSAMIDE 50 MG PO TABS
200.0000 mg | ORAL_TABLET | Freq: Two times a day (BID) | ORAL | Status: DC
  Administered 2020-04-24 – 2020-05-02 (×18): 200 mg
  Filled 2020-04-24 (×18): qty 4

## 2020-04-24 MED ORDER — GUAIFENESIN-DM 100-10 MG/5ML PO SYRP
10.0000 mL | ORAL_SOLUTION | ORAL | Status: DC | PRN
  Administered 2020-04-25 – 2020-05-02 (×7): 10 mL
  Filled 2020-04-24 (×8): qty 10

## 2020-04-24 MED ORDER — LACOSAMIDE 10 MG/ML PO SOLN: 200.0000 mg | Freq: Two times a day (BID) | ORAL | Status: DC

## 2020-04-24 MED ORDER — VALPROIC ACID 250 MG/5ML PO SOLN
500.0000 mg | Freq: Two times a day (BID) | ORAL | Status: DC
  Administered 2020-04-24 – 2020-05-05 (×23): 500 mg
  Filled 2020-04-24 (×24): qty 10

## 2020-04-24 MED ORDER — POLYETHYLENE GLYCOL 3350 17 G PO PACK
17.0000 g | PACK | Freq: Two times a day (BID) | ORAL | Status: DC
  Administered 2020-04-24 – 2020-04-25 (×3): 17 g
  Filled 2020-04-24 (×4): qty 1

## 2020-04-24 MED ORDER — BISACODYL 10 MG RE SUPP: 10.0000 mg | Freq: Every day | RECTAL | Status: DC

## 2020-04-24 MED ORDER — MAGNESIUM SULFATE 2 GM/50ML IV SOLN
2.0000 g | Freq: Once | INTRAVENOUS | Status: AC
  Administered 2020-04-24: 2 g via INTRAVENOUS
  Filled 2020-04-24: qty 50

## 2020-04-24 MED ORDER — FUROSEMIDE 10 MG/ML IJ SOLN
40.0000 mg | Freq: Once | INTRAMUSCULAR | Status: AC
  Administered 2020-04-24: 40 mg via INTRAVENOUS
  Filled 2020-04-24: qty 4

## 2020-04-24 MED ORDER — LACOSAMIDE 10 MG/ML PO SOLN
200.0000 mg | Freq: Two times a day (BID) | ORAL | Status: DC
  Filled 2020-04-24: qty 20

## 2020-04-24 MED ORDER — POTASSIUM CHLORIDE 20 MEQ PO PACK
40.0000 meq | PACK | Freq: Once | ORAL | Status: AC
  Administered 2020-04-24: 40 meq
  Filled 2020-04-24: qty 2

## 2020-04-24 MED ORDER — POTASSIUM CHLORIDE 20 MEQ PO PACK: 40.0000 meq | PACK | Freq: Once | ORAL | Status: DC

## 2020-04-24 MED ORDER — ACETAMINOPHEN 160 MG/5ML PO SOLN
500.0000 mg | Freq: Four times a day (QID) | ORAL | Status: DC | PRN
  Administered 2020-04-24 – 2020-04-25 (×2): 500 mg
  Administered 2020-04-25: 650 mg
  Administered 2020-04-26 – 2020-05-02 (×4): 500 mg
  Filled 2020-04-24 (×7): qty 20.3

## 2020-04-24 MED ORDER — CLONAZEPAM 0.5 MG PO TABS
0.5000 mg | ORAL_TABLET | Freq: Three times a day (TID) | ORAL | Status: DC
  Administered 2020-04-24 – 2020-04-26 (×8): 0.5 mg
  Filled 2020-04-24 (×8): qty 1

## 2020-04-24 MED ORDER — ALBUMIN HUMAN 25 % IV SOLN
25.0000 g | Freq: Four times a day (QID) | INTRAVENOUS | Status: AC
  Administered 2020-04-24 – 2020-04-25 (×4): 25 g via INTRAVENOUS
  Filled 2020-04-24 (×4): qty 100

## 2020-04-24 NOTE — Progress Notes (Signed)
eLink Physician-Brief Progress Note Patient Name: Maria [name] DOB: 1961/03/11 MRN: [id number]   Date of Service  04/24/2020  HPI/Events of Note  Nursing reports patient with ongoing sinus tachycardia, increased O2 requirement, increased RR at 25-30. Nurse gave Haldol for "restlessness" earlier without effect. Patient is 11.7 liters positive fluid balance since admission. Bedside nurse feels that more sedation is indicated.   eICU Interventions  Plan: 1. Portable CXR STAT. 2. Lasix 40 mg IV X 1 now.  3. Trail of BiPAP if the patient will tolerate it.  4. Will ask the ground team to evaluate the patient at bedside.      Intervention Category Major Interventions: Hypoxemia - evaluation and management  Aaryn Parrilla Eugene 04/24/2020, 5:03 AM

## 2020-04-24 NOTE — Progress Notes (Addendum)
04/24/2020  I saw and evaluated the patient. Discussed with resident and agree with resident's findings and plan as documented in the resident's note.  I have seen and evaluated the patient for acute metabolic encephalopathy, seizures.  S:  Agitated overnight, given lasix. CXR looks stable. She's pretty somnolent this am.  O: Blood pressure 96/73, pulse 83, temperature 99.8 F (37.7 C), temperature source Axillary, resp. rate 19, height 5\' 6"  (1.676 m), weight 65.4 kg, SpO2 98 %.  Somnolent Moves all 4 ext to painful stimuli Lungs with rhonci, comfortable on NRB  K/Mg repleted  A:  -Acute metabolic encephalopathy- some combination ICU delirium, postictal status, and hepatic encephalopathy - Status epilepticus resolveed -Severe sepsis secondary to pyelonephritis improved -Acute hypoxemic respiratory failure secondary to aspiration pneumonitis of both lungs  P:  - Complete 14 day course of abx - Re-orient, try to keep awake during day, asleep at night - Continue AEDs and klonipin as ordered - Not really sure she is volume overloaded, weight is actually down for admission, CXR showing resolving pneumonitis, try to keep even - Sister understandably frustrated by lack of progress, will try to reassure her   Patient critically ill due to metabolic encephalopathy Interventions to address this today titration of sedatives, treating underlying infection Risk of deterioration without these interventions is high  I personally spent 32 minutes providing critical care not including any separately billable procedures  Erskine Emery MD [facility] 04/24/2020 10:35 AM Personal pager: [phone] If unanswered, please page CCM On-call: [phone]       NAME:  Maria [name], MRN:  [id number], DOB:  [birth year], LOS: 27 ADMISSION DATE:  04/14/2020, CONSULTATION DATE:  04/16/20 REFERRING MD:  Forestine Na, Dr. Wynetta Emery, CHIEF COMPLAINT:  sepsis   Brief History   59  year old woman with hx of epilepsy, intellectual disability, GERD, here with sepsis from pyelonephritis complicated by delirium and seizures.  Past Medical History  Intellectual disability Epilepsy Chronic constipation Dysphagia  Significant Hospital Events   11/12 admitted w/ septic shock and acute TME 11/15 PICC and CorTrak placed.  11/16 off pressors. Still agitated and delirious. On mono rx for ecoli 11/18 Seizure loaded on keppra Consults:  Neurology  Procedures:  RUE PICC 11/15  Significant Diagnostic Tests:  CT abdomen:  Findings suspicious for pyelonephritis and small abscess in lower pole of left kidney. Suggest correlation with urinalysis, and recommend continued follow-up by CT to confirm resolution. No evidence of ureteral calculi or hydronephrosis.  EEG: - Seizure, generalized - BIRDs - Spike and wave, generalized - Continuous slow, generalized  EKG: NSR Qtc 460 Micro Data:  Urine cutlure:  (pansens) Ecoli  Blood culture: Negative  Antimicrobials:  Ceftriaxone 2g 11/12 --> Azithro 11/14 --11/15 Zosyn 11/14 --> 11/15  Interim history/subjective:  Overnight agitated overnight due to precedex infusion leak. This morning she is alert but not oriented, no longer agitated.   Objective   Blood pressure 96/73, pulse (!) 117, temperature 99.8 F (37.7 C), temperature source Axillary, resp. rate (!) 29, height 5\' 6"  (1.676 m), weight 65.4 kg, SpO2 100 %.        Intake/Output Summary (Last 24 hours) at 04/24/2020 0746 Last data filed at 04/24/2020 0600 Gross per 24 hour  Intake 2505.26 ml  Output 1850 ml  Net 655.26 ml   Filed Weights   04/19/20 0359 04/20/20 0324 04/23/20 0500  Weight: 69.5 kg 70.2 kg 65.4 kg    Examination: General: caucasian female, chronically ill appearing Pulm: No tachypnea,  Slight rhonchi on the right side Card: RRR, no murmurs abd: Soft, active BS, no masses Derm diffuse petechial rash no edema  Neuro: Awake, alert, not  following commands  Resolved Hospital Problem list     Assessment & Plan:  Maria [name] is a 59 y.o. woman   Acute metabolic encephalopathy complicated by baseline intellectual disability & Hx of epilepsy Breakthrough seizures ICU delirium - No further seizures since 11/18.  - Neurology consulted, greatly appreciated reccomendations - Tolerating tube feeds will transition AEDs to enteral depakote 500 mg BID, Vimpat 200 mg BID.  - Continue seroquel 50 mg BID  - Continue Clonazepam 0.5mg  TID - Ativan 2 mg PRN for seizures -Continue haldol 5 mg prn and taper down precedex today.  We will get out of bed to chair.  Reorient establish day/night routine as able.   Acute hypoxic respiratory failure secondary to aspiration pneumonitis - wean oxygen as tolerated goal saturations over 90%.  - suction and aspiration precautions  Constipation Patient with multiple episodes of emesis overnight. Started on lactulose for hyperammonemia with minimal stool output. -Scheduled MiraLAX and Colace  Moderate Protein Calorie malnutrition Plan Continue scheduled Reglan Continue tube feeds  Septic shock secondary to E Coli Pyelonephritis, question of small associated abscess Shock resolved - continue ceftriaxone for total 14 day course.   Best practice:  Diet: Resume tube feeds Pain/Anxiety/Delirium protocol (if indicated): wean precedex to off today.  VAP protocol (if indicated): N/A DVT prophylaxis: lovenox 40mg  daily GI prophylaxis: Protonix  Glucose control: ISS Mobility: PT  Code Status: Full  Family Communication: Will attempt to contact patient's mother Disposition: ICU  Iona Beard PGY-1 Internal Medicine 04/24/20 9:20 AM    Labs   CBC: Recent Labs  Lab 04/18/20 0416 04/18/20 0416 04/18/20 0940 04/19/20 0539 04/20/20 0422 04/21/20 0400 04/23/20 0411  WBC 7.1  --   --  8.1 5.7 5.0 13.0*  NEUTROABS 5.4  --   --  4.8 4.1  --   --   HGB 9.6*  --   --  9.6* 9.2* 10.4*  8.5*  HCT 30.8*  --   --  30.8* 29.1* 32.5* 26.7*  MCV 100.3*  --   --  100.7* 100.7* 97.3 96.7  PLT 146*   < > 131* 181 200 231 273   < > = values in this interval not displayed.    Basic Metabolic Panel: Recent Labs  Lab 04/17/20 0751 04/17/20 1825 04/18/20 0416 04/18/20 0416 04/18/20 1700 04/18/20 1700 04/19/20 0539 04/20/20 0422 04/20/20 1700 04/21/20 0400 04/22/20 0500 04/23/20 0411 04/24/20 0557  NA   < >  --  149*   < > 151*   < > 149*   < > 145 142 140 139 141  K   < >  --  4.0   < > 3.7   < > 3.5   < > 3.6 3.7 3.6 3.5 3.4*  CL   < >  --  115*   < > 115*   < > 112*   < > 104 100 98 101 96*  CO2   < >  --  24   < > 27   < > 28   < > 32 29 31 30 27   GLUCOSE   < >  --  186*   < > 161*   < > 166*   < > 190* 130* 190* 134* 153*  BUN   < >  --  38*   < >  41*   < > 34*   < > 15 16 20 14 12   CREATININE   < >  --  0.81   < > 0.72   < > 0.72   < > 0.61 0.65 0.73 0.80 0.87  CALCIUM   < >  --  8.8*   < > 8.6*   < > 8.5*   < > 9.0 9.0 8.8* 8.8* 9.3  MG   < > 2.2 2.4  --  2.3  --  2.2  --   --   --   --  1.9 1.7  PHOS  --  2.3* 2.0*  --  1.9*  --  2.9  --   --   --   --   --   --    < > = values in this interval not displayed.   GFR: Estimated Creatinine Clearance: 66 mL/min (by C-G formula based on SCr of 0.87 mg/dL). Recent Labs  Lab 04/17/20 0751 04/18/20 0416 04/19/20 0539 04/20/20 0422 04/21/20 0400 04/23/20 0411  PROCALCITON 0.47  --   --   --   --   --   WBC 6.1   < > 8.1 5.7 5.0 13.0*   < > = values in this interval not displayed.    Liver Function Tests: No results for input(s): AST, ALT, ALKPHOS, BILITOT, PROT, ALBUMIN in the last 168 hours. Recent Labs  Lab 04/18/20 0940 04/21/20 0400  AMMONIA 76* 46*    CBG: Recent Labs  Lab 04/23/20 1551 04/23/20 2013 04/23/20 2346 04/24/20 0352 04/24/20 0734  GLUCAP 96 127* 108* 104* 153*    Past Medical History  She,  has a past medical history of Constipation, Dysphagia, Fracture, GERD (gastroesophageal  reflux disease), History of shingles (10/2016), Mental retardation, Seizures ([place]), Thrombocytopenia ([place]), and Tremor.   Surgical History    Past Surgical History:  Procedure Laterality Date  . BIOPSY  09/16/2014   Procedure: BIOPSY;  Surgeon: Rogene Houston, MD;  Location: [facility];  Service: Endoscopy;;  . CATARACT EXTRACTION     both eyes, May of 2015  . COLONOSCOPY    . COLONOSCOPY WITH PROPOFOL N/A 11/20/2018   Procedure: COLONOSCOPY WITH PROPOFOL;  Surgeon: Rogene Houston, MD;  [facility];  Service: Endoscopy;  Laterality: N/A;  . ESOPHAGEAL DILATION N/A 09/16/2014   Procedure: ESOPHAGEAL DILATION WITH 54FR MALONEY DILATOR;  Surgeon: Rogene Houston, MD;  Location: [facility];  Service: Endoscopy;  Laterality: N/A;  . ESOPHAGOGASTRODUODENOSCOPY (EGD) WITH PROPOFOL N/A 09/16/2014   Procedure: ESOPHAGOGASTRODUODENOSCOPY (EGD) WITH PROPOFOL;  Surgeon: Rogene Houston, MD;  Location: [facility];  Service: Endoscopy;  Laterality: N/A;  . MOUTH SURGERY    . POLYPECTOMY  11/20/2018   Procedure: POLYPECTOMY;  Surgeon: Rogene Houston, MD;  [facility];  Service: Endoscopy;;  colon  . Skin graft to gum Right 08/2013  . TONSILLECTOMY AND ADENOIDECTOMY    . TOTAL ABDOMINAL HYSTERECTOMY       Social History   reports that she quit smoking about 13 years ago. Her smoking use included cigarettes. She has a 22.50 pack-year smoking history. She has never used smokeless tobacco. She reports that she does not drink alcohol and does not use drugs.   Family History   Her family history includes Diabetes in her father; High Cholesterol in her mother; High blood pressure in her mother.   Allergies Allergies  Allergen Reactions  . Codeine Nausea And Vomiting  .  Lamictal [Lamotrigine] Rash     Home Medications  Prior to Admission medications   Medication Sig Start Date End Date Taking? Authorizing Provider  atorvastatin (LIPITOR) 20 MG tablet Take 20 mg by mouth daily. 08/24/18   Yes [provider]  clonazePAM (KLONOPIN) 0.5 MG tablet Take 1 tablet (0.5 mg total) by mouth 2 (two) times daily. 04/13/20  Yes Suzzanne Cloud, NP  divalproex (DEPAKOTE ER) 500 MG 24 hr tablet Take 1 tablet (500 mg total) by mouth 2 (two) times daily. 11/25/19  Yes Suzzanne Cloud, NP  ibuprofen (ADVIL) 200 MG tablet Take 400 mg by mouth every 6 (six) hours as needed for moderate pain.   Yes [provider]  lacosamide (VIMPAT) 200 MG TABS tablet Take 1 tablet (200 mg total) by mouth 2 (two) times daily. 11/25/19  Yes Suzzanne Cloud, NP  levocetirizine (XYZAL) 5 MG tablet Take 5 mg by mouth every evening.    Yes [provider]  omeprazole (PRILOSEC) 40 MG capsule Take 1 capsule (40 mg total) by mouth daily. 11/03/19  Yes Laurine Blazer A, PA-C  oxybutynin (DITROPAN-XL) 5 MG 24 hr tablet Take 5 mg by mouth at bedtime.    Yes [provider]  Pediatric Multiple Vit-C-FA (PEDIATRIC MULTIVITAMIN) chewable tablet Chew 1 tablet by mouth daily.     Yes [provider]

## 2020-04-24 NOTE — Progress Notes (Signed)
eLink Physician-Brief Progress Note Patient Name: AKEIRA LAHM DOB: [birth year] MRN: [id number]   Date of Service  04/24/2020  HPI/Events of Note  Nurse request for cough medication.   eICU Interventions  Plan: 1. Robitussin DM 10 mL per tube Q 4 hours PRN cough.     Intervention Category Major Interventions: Other:  Lysle Dingwall 04/24/2020, 4:10 AM

## 2020-04-24 NOTE — Progress Notes (Addendum)
Called to evaluate patient for agitation, tachycardia (130s-140s), tachypnea and hypoxemia.  Patient was agitated, not oriented but alert, tremulous.   Reviewed chart, had been receiving intermittent ativan, but only about 3mg  per day in divided doses.  Did not respond to haldol or ativan given.  Saturation improved with NRB mask (noted patient mouth breathing).  Nurse noted that precedex infusion port was slightly disconnected and the bed was wet nearby, concern that she was possibly not receiving the precedex for unk amount of time.  Restarted and started eventually to improve.    Sister [name] at bedside. She is upset that the patient "had not received Klonipin." I reviewed the chart with her, and on my quick review, the clonazepam was held after her first dose on 11/19, then restarted in the PM of 11/21.  On my review, it appears the patients seizure occurred on 11/18.  There were no seizures on EEG on 11/20.     Collier Bullock, MD CCM

## 2020-04-24 NOTE — Progress Notes (Signed)
Physical Therapy Treatment Patient Details Name: Maria [name] MRN: [id number] DOB: [birth year] Today's Date: 04/24/2020    History of Present Illness 59 yo admitted with sepsis, pyelonephritis and encephalopathy. 11/18 Sz with continuous EEG. PMhx: epilepsy, intellectual disability, GERD    PT Comments    Pt lethargic this session despite reduction in precedex able to follow intermittent simple commands but no blink to threat and maintaining mouth open with dry tongue. Pt with decreased mobility and interaction suspect related to medication. Will continue to follow to improve mobility as cognition allows. Pt on 15L HFNC on arrival at 100% and able to maintain 96% on 2L during session with RN made aware.    Follow Up Recommendations  Supervision/Assistance - 24 hour;SNF     Equipment Recommendations  None recommended by PT (TBD with progression)    Recommendations for Other Services       Precautions / Restrictions Precautions Precautions: Fall Precaution Comments: cortrak    Mobility  Bed Mobility Overal bed mobility: Needs Assistance Bed Mobility: Supine to Sit;Sit to Supine     Supine to sit: Min assist;+2 for safety/equipment Sit to supine: Mod assist;+2 for safety/equipment   General bed mobility comments: min assist with HHA to shift from supine to sit with assist to clear legs and fully elevate trunk. return to supine with assist to bring legs to surface and position trunk in midline  Transfers Overall transfer level: Needs assistance   Transfers: Sit to/from Stand Sit to Stand: +2 safety/equipment;Mod assist         General transfer comment: mod +2 assist to rise from surface x 3 trials with multimodal cueing pt initiating anterior translation and rise with ability to clear surface but not fully extend hips and trunk. Pt stood and scooted toward HOB x 3 trials  Ambulation/Gait             General Gait Details: not yet able   Stairs              Wheelchair Mobility    Modified Rankin (Stroke Patients Only)       Balance Overall balance assessment: Needs assistance Sitting-balance support: Bilateral upper extremity supported;No upper extremity supported;Feet supported Sitting balance-Leahy Scale: Poor Sitting balance - Comments: EOB 8 min with min to max assist with varied anterior/posterior lean with flexed neck and trunk with cues and facilitation for midline and extension Postural control: Posterior lean Standing balance support: No upper extremity supported;Bilateral upper extremity supported Standing balance-Leahy Scale: Zero                              Cognition Arousal/Alertness: Lethargic;Suspect due to medications Behavior During Therapy: Flat affect;Restless Overall Cognitive Status: Impaired/Different from baseline Area of Impairment: Attention;Following commands                   Current Attention Level: Focused   Following Commands: Follows one step commands inconsistently;Follows one step commands with increased time       General Comments: pt with eyes partially open, mouth open and no real verbal response throughout session. pt able to squeeze bil hands consistently on command. Pt assisting with sitting from supine and standing x 3 trials with multimodal cues to initiate      Exercises General Exercises - Lower Extremity Heel Slides: PROM;Both;Supine;15 reps    General Comments        Pertinent Vitals/Pain Pain Assessment: No/denies pain  Home Living                      Prior Function            PT Goals (current goals can now be found in the care plan section) Progress towards PT goals: Progressing toward goals    Frequency    Min 3X/week      PT Plan Current plan remains appropriate    Co-evaluation PT/OT/SLP Co-Evaluation/Treatment: Yes Reason for Co-Treatment: For patient/therapist safety;Necessary to address cognition/behavior during  functional activity PT goals addressed during session: Mobility/safety with mobility        AM-PAC PT "6 Clicks" Mobility   Outcome Measure  Help needed turning from your back to your side while in a flat bed without using bedrails?: A Lot Help needed moving from lying on your back to sitting on the side of a flat bed without using bedrails?: A Lot Help needed moving to and from a bed to a chair (including a wheelchair)?: Total Help needed standing up from a chair using your arms (e.g., wheelchair or bedside chair)?: Total Help needed to walk in hospital room?: Total Help needed climbing 3-5 steps with a railing? : Total 6 Click Score: 8    End of Session   Activity Tolerance: Patient limited by lethargy Patient left: in bed;with bed alarm set;with nursing/sitter in room Nurse Communication: Mobility status;Precautions PT Visit Diagnosis: Other abnormalities of gait and mobility (R26.89)     Time: 7680-8811 PT Time Calculation (min) (ACUTE ONLY): 23 min  Charges:  $Therapeutic Activity: 8-22 mins                     Keilon Ressel P, PT Acute Rehabilitation Services Pager: [phone] Office: [place] 04/24/2020, 11:33 AM

## 2020-04-24 NOTE — Evaluation (Signed)
Occupational Therapy Evaluation Patient Details Name: Maria [name] MRN: [id number] DOB: 08/13/1960 Today's Date: 04/24/2020    History of Present Illness 59 yo admitted with sepsis, pyelonephritis and encephalopathy. 11/18 Sz with continuous EEG. PMhx: epilepsy, intellectual disability, GERD   Clinical Impression   Per chart, PTA, pt was living with her mom, pt was independent with ADL and functional mobility without AD. She required supervision for safety with IADL secondary to intellectual disability. Pt received sitting EOB with PT and PT-student upon arrival. Despite reduction of precedex, pt with limited active engagement during session. She would respond intermittently to commands <5% of time, currently requiring totalA for all ADL. Pt with poor righting reaction sitting EOB requiring maxA for upright position. Pt initially on 15L HFNC SpO2 100%, ablet to maintain SpO2 96% on 2lnc, notified RN. Due to decline in current level of function, pt would benefit from acute OT to address established goals to facilitate safe D/C to venue listed below. At this time, recommend SNF follow-up. Will continue to follow acutely.     Follow Up Recommendations  SNF;Supervision/Assistance - 24 hour    Equipment Recommendations  Other (comment) (TBD)    Recommendations for Other Services       Precautions / Restrictions Precautions Precautions: Fall Precaution Comments: cortrak Restrictions Weight Bearing Restrictions: No      Mobility Bed Mobility Overal bed mobility: Needs Assistance Bed Mobility: Supine to Sit;Sit to Supine     Supine to sit: Min assist;+2 for safety/equipment Sit to supine: Mod assist;+2 for safety/equipment   General bed mobility comments: pt sitting EOB with PT and PT-student upon arrival. Pt required maxA for sitting balance, pt initiating return to supine, required modA for trunk return and BLE management    Transfers Overall transfer level: Needs  assistance   Transfers: Sit to/from Stand Sit to Stand: +2 safety/equipment;Mod assist         General transfer comment: mod +2 assist to rise from surface x 3 trials with multimodal cueing pt initiating anterior translation and rise with ability to clear surface but not fully extend hips and trunk. Pt stood and scooted toward HOB x 3 trials    Balance Overall balance assessment: Needs assistance Sitting-balance support: Bilateral upper extremity supported;No upper extremity supported;Feet supported Sitting balance-Leahy Scale: Poor Sitting balance - Comments: EOB 8 min with min to max assist with varied anterior/posterior lean with flexed neck and trunk with cues and facilitation for midline and extension Postural control: Posterior lean Standing balance support: No upper extremity supported;Bilateral upper extremity supported Standing balance-Leahy Scale: Zero                             ADL either performed or assessed with clinical judgement   ADL Overall ADL's : Needs assistance/impaired                                       General ADL Comments: Pt is currently totalA for all ADL, follow commands intermittently. provided hand over hand assistance for pt to wash her face, no noted active participation     Vision   Additional Comments: continue to assess     Perception     Praxis      Pertinent Vitals/Pain Pain Assessment: No/denies pain     Hand Dominance Right   Extremity/Trunk Assessment Upper Extremity Assessment Upper Extremity  Assessment: Difficult to assess due to impaired cognition   Lower Extremity Assessment Lower Extremity Assessment: Difficult to assess due to impaired cognition   Cervical / Trunk Assessment Cervical / Trunk Assessment:  (rounded shoulders EOB, pt with good strength all extremities and moving then all even with resistance)   Communication Communication Communication: Expressive difficulties    Cognition Arousal/Alertness: Lethargic;Suspect due to medications Behavior During Therapy: Flat affect;Restless Overall Cognitive Status: Impaired/Different from baseline Area of Impairment: Attention;Following commands                   Current Attention Level: Focused   Following Commands: Follows one step commands inconsistently;Follows one step commands with increased time       General Comments: pt with eyes partially open, mouth open and no real verbal response throughout session. pt able to squeeze bil hands consistently on command. Pt assisting with sitting from supine and standing x 3 trials with multimodal cues to initiate   General Comments  Pt on 15L HFNC initially, able to wean pt to 2lnc SpO2 >95% notified RN    Exercises     Shoulder Instructions      Home Living Family/patient expects to be discharged to:: Private residence Living Arrangements: Parent Available Help at Discharge: Family;Available 24 hours/day Type of Home: House Home Access: Stairs to enter [employer] of Steps: 1   Home Layout: Two level;Laundry or work area in basement;Able to live on main level with bedroom/bathroom     Bathroom Shower/Tub: Teacher, early years/pre: [place]: Environmental consultant - 2 wheels;Shower seat;Bedside commode;Wheelchair - manual          Prior Functioning/Environment Level of Independence: Needs assistance  Gait / Transfers Assistance Needed: walks without RW ADL's / Homemaking Assistance Needed: performs ADLs on her own   Comments: supervision for safety, cognition, medication        OT Problem List: Decreased strength;Decreased activity tolerance;Decreased range of motion;Impaired balance (sitting and/or standing);Decreased cognition;Decreased safety awareness;Decreased knowledge of use of DME or AE;Decreased knowledge of precautions      OT Treatment/Interventions:      OT Goals(Current goals can be found in the  care plan section) Acute Rehab OT Goals Patient Stated Goal: pt did not state OT Goal Formulation: Patient unable to participate in goal setting Potential to Achieve Goals: Good ADL Goals Pt Will Perform Grooming: with min guard assist;standing Pt Will Transfer to Toilet: with min assist;stand pivot transfer Additional ADL Goal #1: Pt will complete bed mobility with minguard assistance in preparation for ADL. Additional ADL Goal #2: Pt will follow 1-2 step command consistently during ADL and functional mobility.  OT Frequency: Min 2X/week   Barriers to D/C:            Co-evaluation PT/OT/SLP Co-Evaluation/Treatment: Yes Reason for Co-Treatment: Complexity of the patient's impairments (multi-system involvement);For patient/therapist safety;To address functional/ADL transfers PT goals addressed during session: Mobility/safety with mobility OT goals addressed during session: ADL's and self-care      AM-PAC OT "6 Clicks" Daily Activity     Outcome Measure Help from another person eating meals?: Total Help from another person taking care of personal grooming?: Total Help from another person toileting, which includes using toliet, bedpan, or urinal?: Total Help from another person bathing (including washing, rinsing, drying)?: Total Help from another person to put on and taking off regular upper body clothing?: Total Help from another person to put on and taking off regular lower body  clothing?: Total 6 Click Score: 6   End of Session Equipment Utilized During Treatment: Oxygen Nurse Communication: Mobility status;Other (comment) (O2)  Activity Tolerance: Patient limited by fatigue Patient left: in chair;with call bell/phone within reach;with chair alarm set  OT Visit Diagnosis: Unsteadiness on feet (R26.81);Other abnormalities of gait and mobility (R26.89);Muscle weakness (generalized) (M62.81);Other symptoms and signs involving cognitive function                Time:  1100-1119 OT Time Calculation (min): 19 min Charges:  OT General Charges $OT Visit: 1 Visit OT Evaluation $OT Eval High Complexity: 1 High  Rodel Glaspy OTR/L Acute Rehabilitation Services Office: [place] 04/24/2020, 12:05 PM

## 2020-04-24 NOTE — Progress Notes (Signed)
Pharmacy Electrolyte Replacement  Recent Labs:  Recent Labs    04/24/20 0557  K 3.4*  MG 1.7  CREATININE 0.87    Low Critical Values (K </= 2.5, Phos </= 1, Mg </= 1) Present: None  Plan: Give potassium chloride 40 mEq per tube x1 dose and give magnesium sulfate 2 g IV x1 dose    Shauna Hugh, PharmD, [place]  PGY-1 Pharmacy Resident 04/24/2020 7:19 AM  Please check [email or web address] for unit-specific pharmacy phone numbers.

## 2020-04-25 ENCOUNTER — Inpatient Hospital Stay (HOSPITAL_COMMUNITY): Payer: Medicare Other

## 2020-04-25 DIAGNOSIS — G9341 Metabolic encephalopathy: Secondary | ICD-10-CM | POA: Diagnosis not present

## 2020-04-25 DIAGNOSIS — N12 Tubulo-interstitial nephritis, not specified as acute or chronic: Secondary | ICD-10-CM | POA: Diagnosis not present

## 2020-04-25 DIAGNOSIS — N390 Urinary tract infection, site not specified: Secondary | ICD-10-CM | POA: Diagnosis not present

## 2020-04-25 DIAGNOSIS — A419 Sepsis, unspecified organism: Secondary | ICD-10-CM | POA: Diagnosis not present

## 2020-04-25 LAB — MAGNESIUM: Magnesium: 2.4 mg/dL (ref 1.7–2.4)

## 2020-04-25 LAB — BASIC METABOLIC PANEL
Anion gap: 16 — ABNORMAL HIGH (ref 5–15)
BUN: 19 mg/dL (ref 6–20)
CO2: 27 mmol/L (ref 22–32)
Calcium: 9.1 mg/dL (ref 8.9–10.3)
Chloride: 98 mmol/L (ref 98–111)
Creatinine, Ser: 0.89 mg/dL (ref 0.44–1.00)
GFR, Estimated: >60 mL/min (ref >60)
Glucose, Bld: 167 mg/dL — ABNORMAL HIGH (ref 70–99)
Potassium: 4 mmol/L (ref 3.5–5.1)
Sodium: 141 mmol/L (ref 135–145)

## 2020-04-25 LAB — GLUCOSE, CAPILLARY
Glucose-Capillary: 163 mg/dL — ABNORMAL HIGH (ref 70–99)
Glucose-Capillary: 115 mg/dL — ABNORMAL HIGH (ref 70–99)
Glucose-Capillary: 118 mg/dL — ABNORMAL HIGH (ref 70–99)
Glucose-Capillary: 162 mg/dL — ABNORMAL HIGH (ref 70–99)
Glucose-Capillary: 142 mg/dL — ABNORMAL HIGH (ref 70–99)
Glucose-Capillary: 138 mg/dL — ABNORMAL HIGH (ref 70–99)

## 2020-04-25 LAB — CBC
HCT: 24.9 % — ABNORMAL LOW (ref 36.0–46.0)
Hemoglobin: 7.8 g/dL — ABNORMAL LOW (ref 12.0–15.0)
MCH: 31 pg (ref 26.0–34.0)
MCHC: 31.3 g/dL (ref 30.0–36.0)
MCV: 98.8 fL (ref 80.0–100.0)
Platelets: 288 10*3/uL (ref 150–400)
RBC: 2.52 MIL/uL — ABNORMAL LOW (ref 3.87–5.11)
RDW: 15.2 % (ref 11.5–15.5)
WBC: 12.8 10*3/uL — ABNORMAL HIGH (ref 4.0–10.5)
nRBC: 0.2 % (ref 0.0–0.2)

## 2020-04-25 LAB — AMMONIA: Ammonia: 43 umol/L — ABNORMAL HIGH (ref 9–35)

## 2020-04-25 MED ORDER — BISACODYL 10 MG RE SUPP
10.0000 mg | Freq: Every day | RECTAL | Status: DC
  Administered 2020-04-25 – 2020-05-05 (×5): 10 mg via RECTAL
  Filled 2020-04-25 (×8): qty 1

## 2020-04-25 MED ORDER — PROSOURCE TF PO LIQD
45.0000 mL | Freq: Three times a day (TID) | ORAL | Status: DC
  Administered 2020-04-25 – 2020-05-02 (×21): 45 mL
  Filled 2020-04-25 (×22): qty 45

## 2020-04-25 MED ORDER — PANTOPRAZOLE SODIUM 40 MG PO PACK
40.0000 mg | PACK | Freq: Every day | ORAL | Status: DC
  Administered 2020-04-25 – 2020-05-02 (×8): 40 mg
  Filled 2020-04-25 (×9): qty 20

## 2020-04-25 NOTE — Progress Notes (Signed)
Physical Therapy Treatment Patient Details Name: Maria [name] MRN: [id number] DOB: 09-Sep-1960 Today's Date: 04/25/2020    History of Present Illness 59 yo admitted with sepsis, pyelonephritis and encephalopathy. 11/18 Sz with continuous EEG. PMhx: epilepsy, intellectual disability, GERD    PT Comments    Pt more alert this session. Pt was more independent with bed mobility this session, but still needed assistance for impulsivity with movement and grabbing. Pt was only able to sit EOB for about 2 minutes before HR got to 155 bpm, so pt was returned to supine. Pt remains appropriate for acute PT once HR becomes more stable with mobility. Pre-treatment vitals while supine: 135 bpm, 4L at 95% SpO2, 150/81 During tx vitals: 155 bpm 90-94% SpO2 at 4L After tx vitals while supine: 130-135 bpm, 4L at >90% SpO2   Follow Up Recommendations  Supervision/Assistance - 24 hour;SNF     Equipment Recommendations  None recommended by PT;Other (comment) (TBD with progression)    Recommendations for Other Services       Precautions / Restrictions Precautions Precautions: Fall Precaution Comments: cortrak Restrictions Weight Bearing Restrictions: No    Mobility  Bed Mobility Overal bed mobility: Needs Assistance Bed Mobility: Supine to Sit;Sit to Supine;Rolling Rolling: Min assist;+2 for physical assistance (for peri care)   Supine to sit: Min assist;+2 for safety/equipment     General bed mobility comments: pt needed +2 for safety with supine to/from sit for safety because of flailing and grabbing  Transfers                 General transfer comment: not attempted this session because of HR  Ambulation/Gait             General Gait Details: not yet able   Stairs             Wheelchair Mobility    Modified Rankin (Stroke Patients Only)       Balance   Sitting-balance support: Bilateral upper extremity supported;No upper extremity supported;Feet  supported (alternating between bilat UE support and no UE support) Sitting balance-Leahy Scale: Poor Sitting balance - Comments: EOB around 2 minutes mod assist +1 for balance due to grabbing and trying to impulsively move around       Standing balance comment: not attempted                            Cognition Arousal/Alertness: Awake/alert Behavior During Therapy: Flat affect;Restless Overall Cognitive Status: Impaired/Different from baseline Area of Impairment: Attention;Following commands;Safety/judgement;Awareness                   Current Attention Level: Focused   Following Commands: Follows one step commands inconsistently              Exercises      General Comments        Pertinent Vitals/Pain Pain Assessment: Faces Faces Pain Scale: Hurts little more Pain Location: peri care Pain Intervention(s): Monitored during session    Home Living                      Prior Function            PT Goals (current goals can now be found in the care plan section) Progress towards PT goals: Progressing toward goals    Frequency    Min 3X/week      PT Plan Current plan remains appropriate    Co-evaluation  AM-PAC PT "6 Clicks" Mobility   Outcome Measure  Help needed turning from your back to your side while in a flat bed without using bedrails?: A Little Help needed moving from lying on your back to sitting on the side of a flat bed without using bedrails?: A Little Help needed moving to and from a bed to a chair (including a wheelchair)?: Total Help needed standing up from a chair using your arms (e.g., wheelchair or bedside chair)?: Total Help needed to walk in hospital room?: Total Help needed climbing 3-5 steps with a railing? : Total 6 Click Score: 10    End of Session   Activity Tolerance: Treatment limited secondary to medical complications (Comment) (HR was at 155 and pt was shaking) Patient left:  in bed;with bed alarm set;with restraints reapplied Nurse Communication: Mobility status PT Visit Diagnosis: Other abnormalities of gait and mobility (R26.89)     Time: 1124-1140 PT Time Calculation (min) (ACUTE ONLY): 16 min  Charges:  $Therapeutic Activity: 8-22 mins                     Maria [name], SPT [id number]   Shanta Dorvil 04/25/2020, 1:40 PM

## 2020-04-25 NOTE — Progress Notes (Deleted)
04/25/2020 I saw and evaluated the patient. Discussed with resident and agree with resident's findings and plan as documented in the resident's note.  I have seen and evaluated the patient for delirium, sepsis.  S:  No events, slept well, more clear headed this AM.  Has stubborn cough.  On ROS c/o back pain from bed.  O: Blood pressure 114/77, pulse 97, temperature 98.4 F (36.9 C), temperature source Axillary, resp. rate 17, height 5\' 6"  (1.676 m), weight 63.9 kg, SpO2 98 %.  Ill appearing woman lying in bed MM dry, NGT in place Lungs with crackles at bases, no respiratory distress Moves all 4 ext to command AO to self Dry hacking nonproductive cough  Mildly reduced Hgb WBC stable Renal function stable CBG okay 700 uop plus some uncharted  A:  -Acute metabolic encephalopathy- some combination of ICU delirium, postictal state, and hepatic encephalopathy.  Improved today. -Status epilepticus resolved with AED titration -Severe sepsis secondary to pyelonephritis improved -Acute hypoxemic respiratory failure due to aspiration pneumonitis of both lungs -Persistent issues clearing secretions -Muscular deconditioning -Baseline intellectual disability -Low grade fever, leukocytosis- would watch for now, remove PICC if persistent  P:  - Complete 14 day course of ceftriaxone - AEDs and klonipin as ordered - Encourage day/night cycles, encourage family at bedside, wean precedex during day, okay to use PRN at night to help sleep - Try to keep volume even, aggressive pulmonary toileting as ordered - Appreciate PT/OT working with her daily   Patient critically ill due to multifactorial metabolic encephalopathy, sepsis, respiratory failure Interventions to address this today weaning precedex, reorientation, antibiotics, pulmonary toileting Risk of deterioration without these interventions is high  I personally spent 34 minutes providing critical care not including any separately  billable procedures  Erskine Emery MD [facility] 04/25/2020 7:37 AM Personal pager: [phone] If unanswered, please page [facility] On-call: [phone]   04/25/2020 Erskine Emery MD

## 2020-04-25 NOTE — Progress Notes (Addendum)
04/25/2020 I saw and evaluated the patient. Discussed with resident and agree with resident's findings and plan as documented in the resident's note.  I have seen and evaluated the patient for delirium, sepsis.  S:  No events, slept well, more clear headed this AM.  Has stubborn cough.  On ROS c/o back pain from bed.  O: Blood pressure 114/77, pulse 97, temperature 98.4 F (36.9 C), temperature source Axillary, resp. rate 17, height 5\' 6"  (1.676 m), weight 63.9 kg, SpO2 98 %.  Ill appearing woman lying in bed MM dry, NGT in place Lungs with crackles at bases, no respiratory distress Moves all 4 ext to command AO to self Dry hacking nonproductive cough  Mildly reduced Hgb WBC stable Renal function stable CBG okay 700 uop plus some uncharted  A:  -Acute metabolic encephalopathy- some combination of ICU delirium, postictal state, and hepatic encephalopathy.  Improved today. -Status epilepticus resolved with AED titration -Severe sepsis secondary to pyelonephritis improved -Acute hypoxemic respiratory failure due to aspiration pneumonitis of both lungs -Persistent issues clearing secretions -Muscular deconditioning -Baseline intellectual disability -Low grade fever, leukocytosis- would watch for now, remove PICC if persistent  P:  - Complete 14 day course of ceftriaxone - AEDs and klonipin as ordered - Encourage day/night cycles, encourage family at bedside, wean precedex during day, okay to use PRN at night to help sleep - Try to keep volume even, aggressive pulmonary toileting as ordered - Appreciate PT/OT working with her daily   Patient critically ill due to multifactorial metabolic encephalopathy, sepsis, respiratory failure Interventions to address this today weaning precedex, reorientation, antibiotics, pulmonary toileting Risk of deterioration without these interventions is high  I personally spent 34 minutes providing critical care not including any  separately billable procedures  Erskine Emery MD [facility] 04/25/2020 7:37 AM Personal pager: [phone] If unanswered, please page CCM On-call: [phone]    NAME:  JANEANE COZART, MRN:  [id number], DOB:  Apr 18, 1961, LOS: 39 ADMISSION DATE:  04/14/2020, CONSULTATION DATE:  04/16/20 REFERRING MD:  Forestine Na, Dr. Wynetta Emery, CHIEF COMPLAINT:  sepsis   Brief History   59 year old woman with hx of epilepsy, intellectual disability, GERD, here with sepsis from pyelonephritis complicated by delirium and seizures.  Past Medical History  Intellectual disability Epilepsy Chronic constipation Dysphagia  Significant Hospital Events   11/12 admitted w/ septic shock and acute TME 11/15 PICC and CorTrak placed.  11/16 off pressors. Still agitated and delirious. On mono rx for ecoli 11/18 Seizure loaded on keppra Consults:  Neurology  Procedures:  RUE PICC 11/15  Significant Diagnostic Tests:  CT abdomen:  Findings suspicious for pyelonephritis and small abscess in lower pole of left kidney. Suggest correlation with urinalysis, and recommend continued follow-up by CT to confirm resolution. No evidence of ureteral calculi or hydronephrosis.  EEG: - Seizure, generalized - BIRDs - Spike and wave, generalized - Continuous slow, generalized  EKG: NSR Qtc 460 Micro Data:  Urine cutlure:  (pansens) Ecoli  Blood culture: Negative  Antimicrobials:  Ceftriaxone 2g 11/12 --> Azithro 11/14 --11/15 Zosyn 11/14 --> 11/15  Interim history/subjective:  No acute overnight events. Febrile overnight. Patient slept well. Today she is complaining of cough and back pain. She is agreeable to getting out of bed onto chair today.  Objective   Blood pressure 99/87, pulse 94, temperature 97.6 F (36.4 C), temperature source Axillary, resp. rate 19, height 5\' 6"  (1.676 m), weight 63.9 kg, SpO2 100 %.    FiO2 (%):  [  100 %] 100 %   Intake/Output Summary (Last 24 hours) at  04/25/2020 0735 Last data filed at 04/25/2020 0600 Gross per 24 hour  Intake 1394.11 ml  Output 700 ml  Net 694.11 ml   Filed Weights   04/20/20 0324 04/23/20 0500 04/25/20 0600  Weight: 70.2 kg 65.4 kg 63.9 kg    Examination: General: caucasian female, chronically ill appearing Pulm: Productive cough, chest clear to auscultation  Card: RRR, no murmurs abd: Soft, active BS, no masses Derm diffuse petechial rash no edema  Neuro: Alert and oriented appropriately following commands and answering questions  Resolved Hospital Problem list     Assessment & Plan:  EMBERLIE GOTCHER is a 59 y.o. woman   Acute metabolic encephalopathy complicated by baseline intellectual disability & Hx of epilepsy Breakthrough seizures ICU delirium Today patient is awake and much more oriented and conversational.  - No further seizures since 11/18 on current AEDs - continue depakote 500 mg BID, Vimpat 200 mg BID.  - Continue seroquel 50 mg BID  - Continue Clonazepam 0.5mg  TID - Ativan 2 mg PRN for seizures - Continue to taper down precedex - Maintain day night cycles - Continue with PT/OT daily  Acute hypoxic respiratory failure secondary to aspiration pneumonitis Continues to have difficulty clearing secretions, improving O2 requirements, WBC improved to 12.8 this morning. - Continue neubuliazed hypertonic saline and CPT - suction and aspiration precautions  Constipation -Scheduled MiraLAX and Colace  Moderate Protein Calorie malnutrition Plan Continue scheduled Reglan Continue tube feeds  Septic shock secondary to E Coli Pyelonephritis, question of small associated abscess Shock resolved - continue ceftriaxone for total 14 day course.   Best practice:  Diet: Resume tube feeds Pain/Anxiety/Delirium protocol (if indicated): wean precedex to off today.  VAP protocol (if indicated): N/A DVT prophylaxis: lovenox 40mg  daily GI prophylaxis: Protonix  Glucose control: ISS Mobility: PT   Code Status: Full  Family Communication:  Updated sister at bedside this morning Disposition: ICU  Iona Beard PGY-1 Internal Medicine 04/25/20 7:35 AM    Labs   CBC: Recent Labs  Lab 04/19/20 0539 04/20/20 0422 04/21/20 0400 04/23/20 0411 04/25/20 0415  WBC 8.1 5.7 5.0 13.0* 12.8*  NEUTROABS 4.8 4.1  --   --   --   HGB 9.6* 9.2* 10.4* 8.5* 7.8*  HCT 30.8* 29.1* 32.5* 26.7* 24.9*  MCV 100.7* 100.7* 97.3 96.7 98.8  PLT 181 200 231 273 093    Basic Metabolic Panel: Recent Labs  Lab 04/18/20 1700 04/18/20 1700 04/19/20 0539 04/20/20 0422 04/21/20 0400 04/22/20 0500 04/23/20 0411 04/24/20 0557 04/25/20 0415  NA 151*   < > 149*   < > 142 140 139 141 141  K 3.7   < > 3.5   < > 3.7 3.6 3.5 3.4* 4.0  CL 115*   < > 112*   < > 100 98 101 96* 98  CO2 27   < > 28   < > 29 31 30 27 27   GLUCOSE 161*   < > 166*   < > 130* 190* 134* 153* 167*  BUN 41*   < > 34*   < > 16 20 14 12 19   CREATININE 0.72   < > 0.72   < > 0.65 0.73 0.80 0.87 0.89  CALCIUM 8.6*   < > 8.5*   < > 9.0 8.8* 8.8* 9.3 9.1  MG 2.3  --  2.2  --   --   --  1.9 1.7  --  PHOS 1.9*  --  2.9  --   --   --   --   --   --    < > = values in this interval not displayed.   GFR: Estimated Creatinine Clearance: 64.5 mL/min (by C-G formula based on SCr of 0.89 mg/dL). Recent Labs  Lab 04/20/20 0422 04/21/20 0400 04/23/20 0411 04/25/20 0415  WBC 5.7 5.0 13.0* 12.8*    Liver Function Tests: No results for input(s): AST, ALT, ALKPHOS, BILITOT, PROT, ALBUMIN in the last 168 hours. Recent Labs  Lab 04/18/20 0940 04/21/20 0400 04/25/20 0415  AMMONIA 76* 46* 43*    CBG: Recent Labs  Lab 04/24/20 1517 04/24/20 1921 04/24/20 2326 04/25/20 0325 04/25/20 0729  GLUCAP 135* 108* 139* 163* 115*    Past Medical History  She,  has a past medical history of Constipation, Dysphagia, Fracture, GERD (gastroesophageal reflux disease), History of shingles (10/2016), Mental retardation, Seizures ([place]),  Thrombocytopenia ([place]), and Tremor.   Surgical History    Past Surgical History:  Procedure Laterality Date  . BIOPSY  09/16/2014   Procedure: BIOPSY;  Surgeon: Rogene Houston, MD;  Location: [facility];  Service: Endoscopy;;  . CATARACT EXTRACTION     both eyes, May of 2015  . COLONOSCOPY    . COLONOSCOPY WITH PROPOFOL N/A 11/20/2018   Procedure: COLONOSCOPY WITH PROPOFOL;  Surgeon: Rogene Houston, MD;  Location: [facility];  Service: Endoscopy;  Laterality: N/A;  . ESOPHAGEAL DILATION N/A 09/16/2014   Procedure: ESOPHAGEAL DILATION WITH 54FR MALONEY DILATOR;  Surgeon: Rogene Houston, MD;  Location: [facility];  Service: Endoscopy;  Laterality: N/A;  . ESOPHAGOGASTRODUODENOSCOPY (EGD) WITH PROPOFOL N/A 09/16/2014   Procedure: ESOPHAGOGASTRODUODENOSCOPY (EGD) WITH PROPOFOL;  Surgeon: Rogene Houston, MD;  Location: [facility];  Service: Endoscopy;  Laterality: N/A;  . MOUTH SURGERY    . POLYPECTOMY  11/20/2018   Procedure: POLYPECTOMY;  Surgeon: Rogene Houston, MD;  Location: [facility];  Service: Endoscopy;;  colon  . Skin graft to gum Right 08/2013  . TONSILLECTOMY AND ADENOIDECTOMY    . TOTAL ABDOMINAL HYSTERECTOMY       Social History   reports that she quit smoking about 13 years ago. Her smoking use included cigarettes. She has a 22.50 pack-year smoking history. She has never used smokeless tobacco. She reports that she does not drink alcohol and does not use drugs.   Family History   Her family history includes Diabetes in her father; High Cholesterol in her mother; High blood pressure in her mother.   Allergies Allergies  Allergen Reactions  . Codeine Nausea And Vomiting  . Lamictal [Lamotrigine] Rash     Home Medications  Prior to Admission medications   Medication Sig Start Date End Date Taking? Authorizing Provider  atorvastatin (LIPITOR) 20 MG tablet Take 20 mg by mouth daily. 08/24/18  Yes [provider]  clonazePAM (KLONOPIN) 0.5 MG tablet Take 1 tablet (0.5  mg total) by mouth 2 (two) times daily. 04/13/20  Yes Suzzanne Cloud, NP  divalproex (DEPAKOTE ER) 500 MG 24 hr tablet Take 1 tablet (500 mg total) by mouth 2 (two) times daily. 11/25/19  Yes Suzzanne Cloud, NP  ibuprofen (ADVIL) 200 MG tablet Take 400 mg by mouth every 6 (six) hours as needed for moderate pain.   Yes [provider]  lacosamide (VIMPAT) 200 MG TABS tablet Take 1 tablet (200 mg total) by mouth 2 (two) times daily. 11/25/19  Yes Butler Denmark  J, NP  levocetirizine (XYZAL) 5 MG tablet Take 5 mg by mouth every evening.    Yes [provider]  omeprazole (PRILOSEC) 40 MG capsule Take 1 capsule (40 mg total) by mouth daily. 11/03/19  Yes Laurine Blazer A, PA-C  oxybutynin (DITROPAN-XL) 5 MG 24 hr tablet Take 5 mg by mouth at bedtime.    Yes [provider]  Pediatric Multiple Vit-C-FA (PEDIATRIC MULTIVITAMIN) chewable tablet Chew 1 tablet by mouth daily.     Yes [provider]

## 2020-04-25 NOTE — Progress Notes (Signed)
Nutrition Follow-up  DOCUMENTATION CODES:   Not applicable  INTERVENTION:   Continue tube feeds via Cortrak: - Osmolite 1.5 @ 50 ml/hr (1200 ml/day) - ProSource TF to 45 ml increase to TID  Tube feeding regimen provides 1920 kcal, 108 grams of protein, and 914 ml of free water daily.  Continue free water flushes 200 ml every 3 hours while awake (5 times per day) for total free water intake 1914 ml per day.  NUTRITION DIAGNOSIS:   Inadequate oral intake related to lethargy/confusion as evidenced by NPO status.  Ongoing  GOAL:   Patient will meet greater than or equal to 90% of their needs  Met via TF  MONITOR:   Diet advancement, Labs, Weight trends, TF tolerance, I & O's  REASON FOR ASSESSMENT:   Consult Enteral/tube feeding initiation and management  ASSESSMENT:   59 year old female who presented to the ED on 11/12 with abdominal pain. PMH of intellectual disability, chronic constipation, epilepsy, intellectual disability, GERD, dysphagia, tremors. Pt admitted with sepsis secondary to UTI/pyelonephritis and left renal abscess.  11/15 - Cortrak placed, tip gastric TF was held on 11/19 due to emesis over night. Back at goal rate today. Tolerating TF without difficulty: Osmolite 1.5 @ 50 ml/hr, ProSource TF 45 ml BID, free water flushes 200 ml q 3 hours while awake (~5 times per day).  New stage 2 pressure injury to back.  Weight now trending down. 63.9 kg today  Medications reviewed and include colace, novolog, MVI with minerals.  Labs reviewed. CBG's: 115-118-162  UOP: 700+ ml x 24 hours I/O's: +11.6 L since admit  Diet Order:   Diet Order            Diet NPO time specified Except for: Ice Chips  Diet effective now                 EDUCATION NEEDS:   No education needs have been identified at this time  Skin:  Skin Assessment: Skin Integrity Issues: Skin Integrity Issues:: Stage II Stage II: back  Last BM:  11/23 type 4  Height:   Ht  Readings from Last 1 Encounters:  04/14/20 5' 6"  (1.676 m)    Weight:   Wt Readings from Last 1 Encounters:  04/25/20 63.9 kg    Ideal Body Weight:  59.1 kg  BMI:  Body mass index is 22.74 kg/m.  Estimated Nutritional Needs:   Kcal:  1800-2000  Protein:  90-110 gm  Fluid:  1.8-2.0 L    Lucas Mallow, RD, LDN, CNSC Please refer to Amion for contact information.

## 2020-04-26 ENCOUNTER — Inpatient Hospital Stay (HOSPITAL_COMMUNITY): Payer: Medicare Other

## 2020-04-26 DIAGNOSIS — R509 Fever, unspecified: Secondary | ICD-10-CM

## 2020-04-26 LAB — BASIC METABOLIC PANEL
Anion gap: 13 (ref 5–15)
BUN: 19 mg/dL (ref 6–20)
CO2: 27 mmol/L (ref 22–32)
Calcium: 9.3 mg/dL (ref 8.9–10.3)
Chloride: 97 mmol/L — ABNORMAL LOW (ref 98–111)
Creatinine, Ser: 0.84 mg/dL (ref 0.44–1.00)
GFR, Estimated: >60 mL/min (ref >60)
Glucose, Bld: 189 mg/dL — ABNORMAL HIGH (ref 70–99)
Potassium: 3.9 mmol/L (ref 3.5–5.1)
Sodium: 137 mmol/L (ref 135–145)

## 2020-04-26 LAB — CBC
HCT: 25.2 % — ABNORMAL LOW (ref 36.0–46.0)
Hemoglobin: 7.9 g/dL — ABNORMAL LOW (ref 12.0–15.0)
MCH: 31.3 pg (ref 26.0–34.0)
MCHC: 31.3 g/dL (ref 30.0–36.0)
MCV: 100 fL (ref 80.0–100.0)
Platelets: 291 10*3/uL (ref 150–400)
RBC: 2.52 MIL/uL — ABNORMAL LOW (ref 3.87–5.11)
RDW: 15.5 % (ref 11.5–15.5)
WBC: 13.4 10*3/uL — ABNORMAL HIGH (ref 4.0–10.5)
nRBC: 0.2 % (ref 0.0–0.2)

## 2020-04-26 LAB — URINALYSIS, ROUTINE W REFLEX MICROSCOPIC
Bacteria, UA: NONE SEEN
Bilirubin Urine: NEGATIVE
Glucose, UA: NEGATIVE mg/dL
Hgb urine dipstick: NEGATIVE
Ketones, ur: NEGATIVE mg/dL
Leukocytes,Ua: NEGATIVE
Nitrite: NEGATIVE
Protein, ur: 30 mg/dL — AB
Specific Gravity, Urine: 1.015 (ref 1.005–1.030)
pH: 8 (ref 5.0–8.0)

## 2020-04-26 LAB — GLUCOSE, CAPILLARY
Glucose-Capillary: 164 mg/dL — ABNORMAL HIGH (ref 70–99)
Glucose-Capillary: 160 mg/dL — ABNORMAL HIGH (ref 70–99)
Glucose-Capillary: 114 mg/dL — ABNORMAL HIGH (ref 70–99)
Glucose-Capillary: 174 mg/dL — ABNORMAL HIGH (ref 70–99)
Glucose-Capillary: 137 mg/dL — ABNORMAL HIGH (ref 70–99)
Glucose-Capillary: 174 mg/dL — ABNORMAL HIGH (ref 70–99)

## 2020-04-26 MED ORDER — QUETIAPINE FUMARATE 50 MG PO TABS
75.0000 mg | ORAL_TABLET | Freq: Two times a day (BID) | ORAL | Status: DC
  Administered 2020-04-26 – 2020-04-28 (×4): 75 mg
  Filled 2020-04-26 (×4): qty 1

## 2020-04-26 NOTE — Progress Notes (Addendum)
04/26/2020 I saw and evaluated the patient. Discussed with resident and agree with resident's findings and plan as documented in the resident's note.  I have seen and evaluated the patient for persistent encephalopathy.  S:  No events. Slept well on precedex overnight.  O: Blood pressure (!) 141/89, pulse (!) 110, temperature 99.2 F (37.3 C), temperature source Axillary, resp. rate 20, height 5\' 6"  (1.676 m), weight 64.4 kg, SpO2 95 %.  Agitated woman in NAD Moving all 4 ext MM dry, cortrak in place Answers questions appropriately with repeated prompting  Still having fevers and low grade leukocytosis Stable anemia  A:  -Acute metabolic encephalopathy- some combination of ICU delirium, postictal state, and hepatic encephalopathy.  I think slowly getting better. - Status epilepticus resolved - Severe sepsis secondary to pyelonephritis resolved - Acute hypoxemic respiratory failure secondary to aspiration pneumonitis of both lungs improved - Persistent leukocytosis and FUO - Baseline intellectual disability  P:  - Check EKG, increase seroquel if QTc okay - Try to hold off further precedex - Reorientation and family at bedside when able - Continue AEDs as ordered - Check LE duplex, UA to help complete FUO workup, if persistent may need to pull PICC - Can go out of ICU once off precedex x 24h  Patient critically ill due tomultifactorial metabolic encephalopathy, sepsis, respiratory failure Interventions to address this todayweaning precedex, reorientation, antibiotics, pulmonary toileting Risk of deterioration without these interventions is high  I personally spent61minutes providing critical care not including any separately billable procedures  Erskine Emery MD [facility] 11/24/20217:37 AM Personal pager: [phone] If unanswered, please page CCM On-call: [phone]    NAME:  Maria [name], MRN:  [id number], DOB:  1961/01/18, LOS:  12 ADMISSION DATE:  04/14/2020, CONSULTATION DATE:  04/16/20 REFERRING MD:  Forestine Na, Dr. Wynetta Emery, CHIEF COMPLAINT:  sepsis   Brief History   59 year old woman with hx of epilepsy, intellectual disability, GERD, here with sepsis from pyelonephritis complicated by delirium and seizures.  Past Medical History  Intellectual disability Epilepsy Chronic constipation Dysphagia  Significant Hospital Events   11/12 admitted w/ septic shock and acute TME 11/15 PICC and CorTrak placed.  11/16 off pressors. Still agitated and delirious. On mono rx for ecoli 11/18 Seizure loaded on keppra Consults:  Neurology  Procedures:  RUE PICC 11/15  Significant Diagnostic Tests:  CT abdomen:  Findings suspicious for pyelonephritis and small abscess in lower pole of left kidney. Suggest correlation with urinalysis, and recommend continued follow-up by CT to confirm resolution. No evidence of ureteral calculi or hydronephrosis.  EEG: - Seizure, generalized - BIRDs - Spike and wave, generalized - Continuous slow, generalized  EKG: NSR Qtc 460  US renal 11/23 IMPRESSION: Stable hypoechoic lesion within the lower pole of the left kidney. While this may represent a small perinephric abscess, as suggested on the prior study, and infected renal cyst cannot be excluded.  Micro Data:  Urine cutlure:  (pansens) Ecoli  Blood culture: Negative  Antimicrobials:  Ceftriaxone 2g 11/12 --> Azithro 11/14 --11/15 Zosyn 11/14 --> 11/15  Interim history/subjective:  No acute overnight events, afebrile. This morning she is sleeping comfortably. Awakens to voice states her Cortrak is uncomfortable.   Objective   Blood pressure (!) 100/57, pulse 82, temperature 99.2 F (37.3 C), temperature source Axillary, resp. rate 20, height 5\' 6"  (1.676 m), weight 64.4 kg, SpO2 99 %.        Intake/Output Summary (Last 24 hours) at 04/26/2020 0819 Last data  filed at 04/26/2020 0800 Gross per 24 hour   Intake 920.87 ml  Output 951 ml  Net -30.13 ml   Filed Weights   04/23/20 0500 04/25/20 0600 04/26/20 0418  Weight: 65.4 kg 63.9 kg 64.4 kg    Examination: General: caucasian female, chronically ill appearing Pulm: chest clear to auscultation, no accessory muscle use, cough improving Card: RRR, no murmurs abd: Soft, active BS, no masses Derm diffuse petechial rash no edema  Neuro: Alert and oriented appropriately following commands and answering questions  Resolved Hospital Problem list     Assessment & Plan:  Maria [name] is a 59 y.o. woman   Acute metabolic encephalopathy complicated by baseline intellectual disability & Hx of epilepsy Breakthrough seizures ICU delirium Today patient is awake and much more oriented and conversational.  - No further seizures since 11/18 on current AEDs - continue depakote 500 mg BID, Vimpat 200 mg BID.  - Continue seroquel 50 mg BID  - Continue Clonazepam 0.5mg  TID - Ativan 2 mg PRN for seizures - Continue to taper down precedex - Maintain day night cycles - Continue with PT/OT daily  Acute hypoxic respiratory failure secondary to aspiration pneumonitis Improving O2 requirements, WBC still elevated at 13.6 this morning. - Continue neubuliazed hypertonic saline and CPT - suction and aspiration precautions - Repeat CXR - LE [place] to r/o DVT  Constipation - Multiple bowel movements yesterday -Scheduled MiraLAX and Colace  Moderate Protein Calorie malnutrition Continue tube feeds  Septic shock secondary to E Coli Pyelonephritis, question of small associated abscess Shock resolved. WBC continues to be elevated. US renal repeated yesterday showing stable renal cyst. - continue ceftriaxone for total 14 day course.  - Repeat UA   Best practice:  Diet: Resume tube feeds Pain/Anxiety/Delirium protocol (if indicated): wean precedex to off today.  VAP protocol (if indicated): N/A DVT prophylaxis: lovenox 40mg  daily GI prophylaxis:  Protonix  Glucose control: ISS Mobility: PT  Code Status: Full  Family Communication:  Updated sister at bedside this morning Disposition: ICU  Maria [name] PGY-1 Internal Medicine 04/26/20 8:19 AM    Labs   CBC: Recent Labs  Lab 04/20/20 0422 04/21/20 0400 04/23/20 0411 04/25/20 0415 04/26/20 0410  WBC 5.7 5.0 13.0* 12.8* 13.4*  NEUTROABS 4.1  --   --   --   --   HGB 9.2* 10.4* 8.5* 7.8* 7.9*  HCT 29.1* 32.5* 26.7* 24.9* 25.2*  MCV 100.7* 97.3 96.7 98.8 100.0  PLT 200 231 273 288 017    Basic Metabolic Panel: Recent Labs  Lab 04/22/20 0500 04/23/20 0411 04/24/20 0557 04/25/20 0415 04/26/20 0410  NA 140 139 141 141 137  K 3.6 3.5 3.4* 4.0 3.9  CL 98 101 96* 98 97*  CO2 31 30 27 27 27   GLUCOSE 190* 134* 153* 167* 189*  BUN 20 14 12 19 19   CREATININE 0.73 0.80 0.87 0.89 0.84  CALCIUM 8.8* 8.8* 9.3 9.1 9.3  MG  --  1.9 1.7 2.4  --    GFR: Estimated Creatinine Clearance: 68.3 mL/min (by C-G formula based on SCr of 0.84 mg/dL). Recent Labs  Lab 04/21/20 0400 04/23/20 0411 04/25/20 0415 04/26/20 0410  WBC 5.0 13.0* 12.8* 13.4*    Liver Function Tests: No results for input(s): AST, ALT, ALKPHOS, BILITOT, PROT, ALBUMIN in the last 168 hours. Recent Labs  Lab 04/21/20 0400 04/25/20 0415  AMMONIA 46* 43*    CBG: Recent Labs  Lab 04/25/20 1520 04/25/20 1931 04/25/20 2318 04/26/20 0328 04/26/20  [place]    Past Medical History  She,  has a past medical history of Constipation, Dysphagia, Fracture, GERD (gastroesophageal reflux disease), History of shingles (10/2016), Mental retardation, Seizures ([place]), Thrombocytopenia ([place]), and Tremor.   Surgical History    Past Surgical History:  Procedure Laterality Date  . BIOPSY  09/16/2014   Procedure: BIOPSY;  Surgeon: Rogene Houston, MD;  Location: [facility];  Service: Endoscopy;;  . CATARACT EXTRACTION     both eyes, May of 2015  . COLONOSCOPY    . COLONOSCOPY WITH  PROPOFOL N/A 11/20/2018   Procedure: COLONOSCOPY WITH PROPOFOL;  Surgeon: Rogene Houston, MD;  [facility];  Service: Endoscopy;  Laterality: N/A;  . ESOPHAGEAL DILATION N/A 09/16/2014   Procedure: ESOPHAGEAL DILATION WITH 54FR MALONEY DILATOR;  Surgeon: Rogene Houston, MD;  Location: [facility];  Service: Endoscopy;  Laterality: N/A;  . ESOPHAGOGASTRODUODENOSCOPY (EGD) WITH PROPOFOL N/A 09/16/2014   Procedure: ESOPHAGOGASTRODUODENOSCOPY (EGD) WITH PROPOFOL;  Surgeon: Rogene Houston, MD;  Location: [facility];  Service: Endoscopy;  Laterality: N/A;  . MOUTH SURGERY    . POLYPECTOMY  11/20/2018   Procedure: POLYPECTOMY;  Surgeon: Rogene Houston, MD;  [facility];  Service: Endoscopy;;  colon  . Skin graft to gum Right 08/2013  . TONSILLECTOMY AND ADENOIDECTOMY    . TOTAL ABDOMINAL HYSTERECTOMY       Social History   reports that she quit smoking about 13 years ago. Her smoking use included cigarettes. She has a 22.50 pack-year smoking history. She has never used smokeless tobacco. She reports that she does not drink alcohol and does not use drugs.   Family History   Her family history includes Diabetes in her father; High Cholesterol in her mother; High blood pressure in her mother.   Allergies Allergies  Allergen Reactions  . Codeine Nausea And Vomiting  . Lamictal [Lamotrigine] Rash     Home Medications  Prior to Admission medications   Medication Sig Start Date End Date Taking? Authorizing Provider  atorvastatin (LIPITOR) 20 MG tablet Take 20 mg by mouth daily. 08/24/18  Yes [provider]  clonazePAM (KLONOPIN) 0.5 MG tablet Take 1 tablet (0.5 mg total) by mouth 2 (two) times daily. 04/13/20  Yes Suzzanne Cloud, NP  divalproex (DEPAKOTE ER) 500 MG 24 hr tablet Take 1 tablet (500 mg total) by mouth 2 (two) times daily. 11/25/19  Yes Suzzanne Cloud, NP  ibuprofen (ADVIL) 200 MG tablet Take 400 mg by mouth every 6 (six) hours as needed for moderate pain.    Yes [provider]  lacosamide (VIMPAT) 200 MG TABS tablet Take 1 tablet (200 mg total) by mouth 2 (two) times daily. 11/25/19  Yes Suzzanne Cloud, NP  levocetirizine (XYZAL) 5 MG tablet Take 5 mg by mouth every evening.    Yes [provider]  omeprazole (PRILOSEC) 40 MG capsule Take 1 capsule (40 mg total) by mouth daily. 11/03/19  Yes Laurine Blazer A, PA-C  oxybutynin (DITROPAN-XL) 5 MG 24 hr tablet Take 5 mg by mouth at bedtime.    Yes [provider]  Pediatric Multiple Vit-C-FA (PEDIATRIC MULTIVITAMIN) chewable tablet Chew 1 tablet by mouth daily.     Yes [provider]

## 2020-04-26 NOTE — Evaluation (Signed)
Clinical/Bedside Swallow Evaluation Patient Details  Name: Maria [name] MRN: [id number] Date of Birth: 1961/05/25  Today's Date: 04/26/2020 Time: SLP Start Time (ACUTE ONLY): 15 SLP Stop Time (ACUTE ONLY): 1356 SLP Time Calculation (min) (ACUTE ONLY): 36 min  Past Medical History:  Past Medical History:  Diagnosis Date  . Constipation   . Dysphagia   . Fracture    R foot  . GERD (gastroesophageal reflux disease)   . History of shingles 10/2016  . Mental retardation    lesion in head  . Seizures ([place])    "not fully controlled on max doses of meds" (Neuro ofc note 12/2014)  . Thrombocytopenia ([place])   . Tremor    Past Surgical History:  Past Surgical History:  Procedure Laterality Date  . BIOPSY  09/16/2014   Procedure: BIOPSY;  Surgeon: Rogene Houston, MD;  Location: [facility];  Service: Endoscopy;;  . CATARACT EXTRACTION     both eyes, May of 2015  . COLONOSCOPY    . COLONOSCOPY WITH PROPOFOL N/A 11/20/2018   Procedure: COLONOSCOPY WITH PROPOFOL;  Surgeon: Rogene Houston, MD;  [facility];  Service: Endoscopy;  Laterality: N/A;  . ESOPHAGEAL DILATION N/A 09/16/2014   Procedure: ESOPHAGEAL DILATION WITH 54FR MALONEY DILATOR;  Surgeon: Rogene Houston, MD;  Location: [facility];  Service: Endoscopy;  Laterality: N/A;  . ESOPHAGOGASTRODUODENOSCOPY (EGD) WITH PROPOFOL N/A 09/16/2014   Procedure: ESOPHAGOGASTRODUODENOSCOPY (EGD) WITH PROPOFOL;  Surgeon: Rogene Houston, MD;  Location: [facility];  Service: Endoscopy;  Laterality: N/A;  . MOUTH SURGERY    . POLYPECTOMY  11/20/2018   Procedure: POLYPECTOMY;  Surgeon: Rogene Houston, MD;  [facility];  Service: Endoscopy;;  colon  . Skin graft to gum Right 08/2013  . TONSILLECTOMY AND ADENOIDECTOMY    . TOTAL ABDOMINAL HYSTERECTOMY     HPI:  59 yo admitted 11/12 with sepsis, pyelonephritis and encephalopathy. PMhx: epilepsy, intellectual disability, GERD, EGD with dilation 2016.  Initial limited clinical swallow  eval completed 11/17 with recs to continue NPO.  SLP s/o at that time due to ongoing lethargy.  Cortrak for nutrition.  Has been having breakthrough seizures.  Improved mentation and has been able to swallow some PO meds and ice chips.    Assessment / Plan / Recommendation Clinical Impression  Pt with improved LOA since last evaluated on 11/17. Despite frequent oral care, she continues to develop thick, crusty secretions along hard and soft palate.  Tweezers were used to remove thick film of secretions from palate and alveolar ridge; oral care completed and suctioning provided.  Unable to follow one-step commands, but pt attended to and received ice chips, masticated them with purpose, and swallowed with initial coughing but decreased signs of aspiration with subsequent trials.  Sips of water elicited immediate and consistent coughing.    When pt is alert, she should be given opportunities to swallow to facilitate recognition, sensory input and motor patterning of the swallow.  Recommend offering 4-6 ice chips every hour; continue frequent oral care.  SLP will follow for readiness for instrumental swallow study. D/W staff.    SLP Visit Diagnosis: Dysphagia, unspecified (R13.10)           Diet Recommendation   NPO except ice chips       Other  Recommendations Oral Care Recommendations: Oral care QID   Follow up Recommendations Other (comment) (tba)      Frequency and Duration min 2x/week  2 weeks  Prognosis Prognosis for Safe Diet Advancement: Fair Barriers to Reach Goals: Cognitive deficits      Swallow Study   General Date of Onset: 04/14/20 HPI: 59 yo admitted 11/12 with sepsis, pyelonephritis and encephalopathy. PMhx: epilepsy, intellectual disability, GERD, EGD with dilation 2016.  Initial limited clinical swallow eval completed 11/17 with recs to continue NPO.  SLP s/o at that time due to ongoing lethargy.  Cortrak for nutrition.  Has been having breakthrough seizures.   Improved mentation and has been able to swallow some PO meds and ice chips.  Type of Study: Bedside Swallow Evaluation Diet Prior to this Study: NPO;NG Tube Temperature Spikes Noted: Yes Respiratory Status: Room air History of Recent Intubation: No Behavior/Cognition: Alert;Confused;Requires cueing Oral Cavity Assessment: Dried secretions Oral Care Completed by SLP: Yes Oral Cavity - Dentition: Adequate natural dentition Self-Feeding Abilities: Total assist Patient Positioning: Upright in bed Baseline Vocal Quality: Normal Volitional Cough: Cognitively unable to elicit Volitional Swallow: Unable to elicit    Oral/Motor/Sensory Function Overall Oral Motor/Sensory Function: Other (comment) (symmetric; not following commands)   Ice Chips Ice chips: Within functional limits Presentation: Spoon   Thin Liquid Thin Liquid: Impaired Presentation: Straw Pharyngeal  Phase Impairments: Cough - Immediate    Nectar Thick Nectar Thick Liquid: Not tested   Honey Thick Honey Thick Liquid: Not tested   Puree Puree: Not tested   Solid     Solid: Not tested      [name] 04/26/2020,2:11 PM   Estill Bamberg L. Tivis Ringer, [place] Office number [phone] Pager [phone]

## 2020-04-26 NOTE — Progress Notes (Signed)
Patient ID: Maria [name], female   DOB: [birth year], 59 y.o.   MRN: [id number]  This NP visited patient at the bedside as a follow up for palliative medicine needs and emotional support.  Mother  at bedside.  Patient remains critically ill 2/2 E. coli pyelonephritis and septic shock, she continues with  extremely agitatation.   Currently off sedation.  She is tachycardic, elevated WBCs.   CCM continues to assess, treat and stabilize.  Speech therapy is currently working with Maria [name], hoping to advance her diet  Education offered to mother regarding current medical situation; questions and concerns addressed to the best of my ability.  Family face ongoing decisions related to treatment options, advance directives and anticipatory care needs.  Therapeutic listening and emotional support offered.  PMT will continue to support holistically  Questions and concerns addressed   Discussed with bedside RN and Speech therapy  Total time spent on the unit was 20 minutes  Greater than 50% of the time was spent in counseling and coordination of care  Wadie Lessen NP  Palliative Medicine Team Team Phone # [phone] Pager [phone]

## 2020-04-26 NOTE — Progress Notes (Deleted)
NAME:  Maria [name], MRN:  [id number], DOB:  January 19, 1961, LOS: 9 ADMISSION DATE:  04/14/2020, CONSULTATION DATE:  04/16/20 REFERRING MD:  Forestine Na, Dr. Wynetta Emery, CHIEF COMPLAINT:  sepsis   Brief History   59 year old woman with hx of epilepsy, intellectual disability, GERD, here with sepsis from pyelonephritis complicated by delirium and seizures.  Past Medical History  Intellectual disability Epilepsy Chronic constipation Dysphagia  Significant Hospital Events   11/12 admitted w/ septic shock and acute TME 11/15 PICC and CorTrak placed.  11/16 off pressors. Still agitated and delirious. On mono rx for ecoli 11/18 Seizure loaded on keppra Consults:  Neurology  Procedures:  RUE PICC 11/15  Significant Diagnostic Tests:  CT abdomen:  Findings suspicious for pyelonephritis and small abscess in lower pole of left kidney. Suggest correlation with urinalysis, and recommend continued follow-up by CT to confirm resolution. No evidence of ureteral calculi or hydronephrosis.  EEG: - Seizure, generalized - BIRDs - Spike and wave, generalized - Continuous slow, generalized  EKG: NSR Qtc 460 Micro Data:  Urine cutlure:  (pansens) Ecoli  Blood culture: Negative  Antimicrobials:  Ceftriaxone 2g 11/12 --> Azithro 11/14 --11/15 Zosyn 11/14 --> 11/15  Interim history/subjective:  No acute overnight events, afebrile. This morning she is sleeping comfortably. Awakens to voice states her Cortrak is uncomfortable.   Objective   Blood pressure (!) 100/57, pulse 82, temperature 99.2 F (37.3 C), temperature source Axillary, resp. rate 20, height 5\' 6"  (1.676 m), weight 64.4 kg, SpO2 99 %.        Intake/Output Summary (Last 24 hours) at 04/26/2020 0819 Last data filed at 04/26/2020 0800 Gross per 24 hour  Intake 920.87 ml  Output 951 ml  Net -30.13 ml   Filed Weights   04/23/20 0500 04/25/20 0600 04/26/20 0418  Weight: 65.4 kg 63.9 kg 64.4 kg     Examination: General: caucasian female, chronically ill appearing Pulm: chest clear to auscultation, no accessory muscle use, cough improving Card: RRR, no murmurs abd: Soft, active BS, no masses Derm diffuse petechial rash no edema  Neuro: Alert and oriented appropriately following commands and answering questions  Resolved Hospital Problem list     Assessment & Plan:  Maria [name] is a 59 y.o. woman   Acute metabolic encephalopathy complicated by baseline intellectual disability & Hx of epilepsy Breakthrough seizures ICU delirium Today patient is awake and much more oriented and conversational.  - No further seizures since 11/18 on current AEDs - continue depakote 500 mg BID, Vimpat 200 mg BID.  - Continue seroquel 50 mg BID, will recheck Qt, if not prolonged can increase to help get off precedex  - Continue Clonazepam 0.5mg  TID - Ativan 2 mg PRN for seizures - Continue to taper down precedex - Maintain day night cycles - Continue with PT/OT daily  Acute hypoxic respiratory failure secondary to aspiration pneumonitis Continues to have difficulty clearing secretions, improving O2 requirements, WBC improved to 12.8 this morning. - Continue neubuliazed hypertonic saline and CPT - suction and aspiration precautions  Constipation -Scheduled MiraLAX and Colace  Moderate Protein Calorie malnutrition Plan Continue scheduled Reglan Continue tube feeds  Septic shock secondary to E Coli Pyelonephritis, question of small associated abscess Shock resolved - continue ceftriaxone for total 14 day course.   Best practice:  Diet: Resume tube feeds Pain/Anxiety/Delirium protocol (if indicated): wean precedex to off today.  VAP protocol (if indicated): N/A DVT prophylaxis: lovenox 40mg  daily GI prophylaxis: Protonix  Glucose control: ISS Mobility: PT  Code Status: Full  Family Communication:  Updated sister at bedside this morning Disposition: ICU  Iona Beard PGY-1  Internal Medicine 04/26/20 8:19 AM    Labs   CBC: Recent Labs  Lab 04/20/20 0422 04/21/20 0400 04/23/20 0411 04/25/20 0415 04/26/20 0410  WBC 5.7 5.0 13.0* 12.8* 13.4*  NEUTROABS 4.1  --   --   --   --   HGB 9.2* 10.4* 8.5* 7.8* 7.9*  HCT 29.1* 32.5* 26.7* 24.9* 25.2*  MCV 100.7* 97.3 96.7 98.8 100.0  PLT 200 231 273 288 563    Basic Metabolic Panel: Recent Labs  Lab 04/22/20 0500 04/23/20 0411 04/24/20 0557 04/25/20 0415 04/26/20 0410  NA 140 139 141 141 137  K 3.6 3.5 3.4* 4.0 3.9  CL 98 101 96* 98 97*  CO2 31 30 27 27 27   GLUCOSE 190* 134* 153* 167* 189*  BUN 20 14 12 19 19   CREATININE 0.73 0.80 0.87 0.89 0.84  CALCIUM 8.8* 8.8* 9.3 9.1 9.3  MG  --  1.9 1.7 2.4  --    GFR: Estimated Creatinine Clearance: 68.3 mL/min (by C-G formula based on SCr of 0.84 mg/dL). Recent Labs  Lab 04/21/20 0400 04/23/20 0411 04/25/20 0415 04/26/20 0410  WBC 5.0 13.0* 12.8* 13.4*    Liver Function Tests: No results for input(s): AST, ALT, ALKPHOS, BILITOT, PROT, ALBUMIN in the last 168 hours. Recent Labs  Lab 04/21/20 0400 04/25/20 0415  AMMONIA 46* 43*    CBG: Recent Labs  Lab 04/25/20 1520 04/25/20 1931 04/25/20 2318 04/26/20 0328 04/26/20 0726  GLUCAP 162* 142* 138* 164* 160*    Past Medical History  She,  has a past medical history of Constipation, Dysphagia, Fracture, GERD (gastroesophageal reflux disease), History of shingles (10/2016), Mental retardation, Seizures ([place]), Thrombocytopenia ([place]), and Tremor.   Surgical History    Past Surgical History:  Procedure Laterality Date  . BIOPSY  09/16/2014   Procedure: BIOPSY;  Surgeon: Rogene Houston, MD;  Location: [facility];  Service: Endoscopy;;  . CATARACT EXTRACTION     both eyes, May of 2015  . COLONOSCOPY    . COLONOSCOPY WITH PROPOFOL N/A 11/20/2018   Procedure: COLONOSCOPY WITH PROPOFOL;  Surgeon: Rogene Houston, MD;  [facility];  Service: Endoscopy;  Laterality: N/A;  . ESOPHAGEAL  DILATION N/A 09/16/2014   Procedure: ESOPHAGEAL DILATION WITH 54FR MALONEY DILATOR;  Surgeon: Rogene Houston, MD;  Location: [facility];  Service: Endoscopy;  Laterality: N/A;  . ESOPHAGOGASTRODUODENOSCOPY (EGD) WITH PROPOFOL N/A 09/16/2014   Procedure: ESOPHAGOGASTRODUODENOSCOPY (EGD) WITH PROPOFOL;  Surgeon: Rogene Houston, MD;  Location: [facility];  Service: Endoscopy;  Laterality: N/A;  . MOUTH SURGERY    . POLYPECTOMY  11/20/2018   Procedure: POLYPECTOMY;  Surgeon: Rogene Houston, MD;  [facility];  Service: Endoscopy;;  colon  . Skin graft to gum Right 08/2013  . TONSILLECTOMY AND ADENOIDECTOMY    . TOTAL ABDOMINAL HYSTERECTOMY       Social History   reports that she quit smoking about 13 years ago. Her smoking use included cigarettes. She has a 22.50 pack-year smoking history. She has never used smokeless tobacco. She reports that she does not drink alcohol and does not use drugs.   Family History   Her family history includes Diabetes in her father; High Cholesterol in her mother; High blood pressure in her mother.   Allergies Allergies  Allergen Reactions  . Codeine Nausea And Vomiting  . Lamictal [Lamotrigine]  Rash     Home Medications  Prior to Admission medications   Medication Sig Start Date End Date Taking? Authorizing Provider  atorvastatin (LIPITOR) 20 MG tablet Take 20 mg by mouth daily. 08/24/18  Yes [provider]  clonazePAM (KLONOPIN) 0.5 MG tablet Take 1 tablet (0.5 mg total) by mouth 2 (two) times daily. 04/13/20  Yes Suzzanne Cloud, NP  divalproex (DEPAKOTE ER) 500 MG 24 hr tablet Take 1 tablet (500 mg total) by mouth 2 (two) times daily. 11/25/19  Yes Suzzanne Cloud, NP  ibuprofen (ADVIL) 200 MG tablet Take 400 mg by mouth every 6 (six) hours as needed for moderate pain.   Yes [provider]  lacosamide (VIMPAT) 200 MG TABS tablet Take 1 tablet (200 mg total) by mouth 2 (two) times daily. 11/25/19  Yes Suzzanne Cloud, NP  levocetirizine  (XYZAL) 5 MG tablet Take 5 mg by mouth every evening.    Yes [provider]  omeprazole (PRILOSEC) 40 MG capsule Take 1 capsule (40 mg total) by mouth daily. 11/03/19  Yes Laurine Blazer A, PA-C  oxybutynin (DITROPAN-XL) 5 MG 24 hr tablet Take 5 mg by mouth at bedtime.    Yes [provider]  Pediatric Multiple Vit-C-FA (PEDIATRIC MULTIVITAMIN) chewable tablet Chew 1 tablet by mouth daily.     Yes [provider]

## 2020-04-26 NOTE — Progress Notes (Signed)
Assisted tele visit to patient with brother.  Maryelizabeth Rowan, RN

## 2020-04-26 NOTE — Progress Notes (Signed)
Lower extremity venous bilateral study completed.  Preliminary results relayed to RN.   See CV Proc for preliminary results report.   Josefine Fuhr, RDMS  

## 2020-04-27 LAB — CBC
HCT: 31.5 % — ABNORMAL LOW (ref 36.0–46.0)
Hemoglobin: 9.7 g/dL — ABNORMAL LOW (ref 12.0–15.0)
MCH: 30.5 pg (ref 26.0–34.0)
MCHC: 30.8 g/dL (ref 30.0–36.0)
MCV: 99.1 fL (ref 80.0–100.0)
Platelets: 403 10*3/uL — ABNORMAL HIGH (ref 150–400)
RBC: 3.18 MIL/uL — ABNORMAL LOW (ref 3.87–5.11)
RDW: 15.8 % — ABNORMAL HIGH (ref 11.5–15.5)
WBC: 18.9 10*3/uL — ABNORMAL HIGH (ref 4.0–10.5)
nRBC: 0.2 % (ref 0.0–0.2)

## 2020-04-27 LAB — GLUCOSE, CAPILLARY
Glucose-Capillary: 160 mg/dL — ABNORMAL HIGH (ref 70–99)
Glucose-Capillary: 159 mg/dL — ABNORMAL HIGH (ref 70–99)
Glucose-Capillary: 170 mg/dL — ABNORMAL HIGH (ref 70–99)
Glucose-Capillary: 188 mg/dL — ABNORMAL HIGH (ref 70–99)
Glucose-Capillary: 156 mg/dL — ABNORMAL HIGH (ref 70–99)
Glucose-Capillary: 145 mg/dL — ABNORMAL HIGH (ref 70–99)

## 2020-04-27 LAB — BASIC METABOLIC PANEL
Anion gap: 17 — ABNORMAL HIGH (ref 5–15)
BUN: 27 mg/dL — ABNORMAL HIGH (ref 6–20)
CO2: 24 mmol/L (ref 22–32)
Calcium: 10 mg/dL (ref 8.9–10.3)
Chloride: 96 mmol/L — ABNORMAL LOW (ref 98–111)
Creatinine, Ser: 0.87 mg/dL (ref 0.44–1.00)
GFR, Estimated: >60 mL/min (ref >60)
Glucose, Bld: 193 mg/dL — ABNORMAL HIGH (ref 70–99)
Potassium: 4.2 mmol/L (ref 3.5–5.1)
Sodium: 137 mmol/L (ref 135–145)

## 2020-04-27 LAB — URINE CULTURE: Culture: NO GROWTH

## 2020-04-27 MED ORDER — PHENOL 1.4 % MT LIQD
1.0000 | Freq: Four times a day (QID) | OROMUCOSAL | Status: DC | PRN
  Administered 2020-04-27: 1 via OROMUCOSAL
  Filled 2020-04-27: qty 177

## 2020-04-27 MED ORDER — POLYETHYLENE GLYCOL 3350 17 G PO PACK
17.0000 g | PACK | Freq: Every day | ORAL | Status: DC
  Administered 2020-05-02 – 2020-05-05 (×4): 17 g
  Filled 2020-04-27 (×9): qty 1

## 2020-04-27 MED ORDER — CLONAZEPAM 1 MG PO TABS
1.0000 mg | ORAL_TABLET | Freq: Three times a day (TID) | ORAL | Status: DC
  Administered 2020-04-27 – 2020-04-28 (×4): 1 mg
  Filled 2020-04-27 (×4): qty 1

## 2020-04-27 NOTE — Progress Notes (Signed)
eLink Physician-Brief Progress Note Patient Name: EZELL MELIKIAN DOB: April 22, 1961 MRN: [id number]   Date of Service  04/27/2020  HPI/Events of Note  Phenol spray requested  eICU Interventions  Ordered     Intervention Category Minor Interventions: Routine modifications to care plan (e.g. PRN medications for pain, fever)  Margaretmary Lombard 04/27/2020, 1:33 AM

## 2020-04-27 NOTE — Progress Notes (Signed)
Assisted tele visit to patient with brother.  Maryelizabeth Rowan, RN

## 2020-04-27 NOTE — Progress Notes (Signed)
NAME:  Maria [name], MRN:  [id number], DOB:  05-07-1961, LOS: 9 ADMISSION DATE:  04/14/2020, CONSULTATION DATE:  04/16/20 REFERRING MD:  Forestine Na, Dr. Wynetta Emery, CHIEF COMPLAINT:  sepsis   Brief History   59 year old woman with hx of epilepsy, intellectual disability, GERD, here with sepsis from pyelonephritis complicated by delirium and seizures.  Past Medical History  Intellectual disability Epilepsy Chronic constipation Dysphagia  Significant Hospital Events   11/12 admitted w/ septic shock and acute TME 11/15 PICC and CorTrak placed.  11/16 off pressors. Still agitated and delirious. On mono rx for ecoli 11/18 Seizure loaded on keppra Consults:  Neurology  Procedures:  RUE PICC 11/15  Significant Diagnostic Tests:  CT abdomen:  Findings suspicious for pyelonephritis and small abscess in lower pole of left kidney. Suggest correlation with urinalysis, and recommend continued follow-up by CT to confirm resolution. No evidence of ureteral calculi or hydronephrosis.  EEG: - Seizure, generalized - BIRDs - Spike and wave, generalized - Continuous slow, generalized  EKG: NSR Qtc 460  US renal 11/23 IMPRESSION: Stable hypoechoic lesion within the lower pole of the left kidney. While this may represent a small perinephric abscess, as suggested on the prior study, and infected renal cyst cannot be excluded.  Micro Data:  Urine cutlure:  (pansens) Ecoli  Blood culture: Negative  Antimicrobials:  Ceftriaxone 2g 11/12 --> Azithro 11/14 --11/15 Zosyn 11/14 --> 11/15  Interim history/subjective:  Slept poorly. Has remained off precedex x 12 hours.  Objective   Blood pressure (!) 152/78, pulse (!) 126, temperature 98.5 F (36.9 C), temperature source Axillary, resp. rate (!) 26, height 5\' 6"  (1.676 m), weight 64.4 kg, SpO2 94 %.        Intake/Output Summary (Last 24 hours) at 04/27/2020 2355 Last data filed at 04/27/2020 0600 Gross per 24 hour  Intake  3200.88 ml  Output 650 ml  Net 2550.88 ml   Filed Weights   04/23/20 0500 04/25/20 0600 04/26/20 0418  Weight: 65.4 kg 63.9 kg 64.4 kg    Examination Constitutional: thin frail elderly woman in NAD  Eyes: equal pupils, tracking Ears, nose, mouth, and throat: MM dry, cortrak in place Cardiovascular: Tachycardic, ext warm Respiratory: Rhonci at bases, no accessory muscle use, dry nonproductive volitional cough noted Gastrointestinal: Soft, +BS Skin: No rashes, normal turgor Neurologic: Moves all 4 ext to command Psychiatric: still a bit confused but slowly improving  WBC up Cr stable CBG okay  Resolved Hospital Problem list     Assessment & Plan:  Maria [name] is a 59 y.o. woman   Acute metabolic encephalopathy complicated by baseline intellectual disability & Hx of epilepsy Breakthrough seizures and postictal status resolved ICU delirium - Seroquel increased 11/24 - Try to keep off precedex - Increase klonipin - Encourage day/night cycles, family at bedside - Needs PT - DC melatonin: no effect to date  Acute hypoxic respiratory failure secondary to aspiration pneumonitis Improving O2 requirements - Encourage IS - CPT - Nebs  Constipation- resolved, lighten bowel regimen  Moderate Protein Calorie malnutrition Continue tube feeds, monitor electrolytes  Septic shock secondary to E Coli Pyelonephritis, probable small associated abscess- shock resolved - Continue ceftriaxone, end date today, see next issue  Fevers, rising white count- if continues through tomorrow will start zosyn, remove PICC, and scan abdomen  Best practice:  Diet: TF Pain/Anxiety/Delirium protocol (if indicated): see above  VAP protocol (if indicated): N/A DVT prophylaxis: lovenox 40mg  daily GI prophylaxis: Protonix  Glucose control:  SSI Mobility: PT  Code Status: Full  Family Communication:  Updated sister at bedside this morning Disposition: ICU pending 24h off precedex and improved  clinical appearance   Patient critically ill due to metabolic encephalopathy Interventions to address this today continued titration off precedex Risk of deterioration without these interventions is high  I personally spent 31 minutes providing critical care not including any separately billable procedures  Erskine Emery MD [facility] 04/27/2020 7:37 AM Personal pager: [phone] If unanswered, please page CCM On-call: [phone]

## 2020-04-28 LAB — BASIC METABOLIC PANEL
Anion gap: 14 (ref 5–15)
BUN: 38 mg/dL — ABNORMAL HIGH (ref 6–20)
CO2: 26 mmol/L (ref 22–32)
Calcium: 9.8 mg/dL (ref 8.9–10.3)
Chloride: 100 mmol/L (ref 98–111)
Creatinine, Ser: 0.94 mg/dL (ref 0.44–1.00)
GFR, Estimated: >60 mL/min (ref >60)
Glucose, Bld: 202 mg/dL — ABNORMAL HIGH (ref 70–99)
Potassium: 4.4 mmol/L (ref 3.5–5.1)
Sodium: 140 mmol/L (ref 135–145)

## 2020-04-28 LAB — CBC
HCT: 29.9 % — ABNORMAL LOW (ref 36.0–46.0)
Hemoglobin: 9.4 g/dL — ABNORMAL LOW (ref 12.0–15.0)
MCH: 31.4 pg (ref 26.0–34.0)
MCHC: 31.4 g/dL (ref 30.0–36.0)
MCV: 100 fL (ref 80.0–100.0)
Platelets: 419 10*3/uL — ABNORMAL HIGH (ref 150–400)
RBC: 2.99 MIL/uL — ABNORMAL LOW (ref 3.87–5.11)
RDW: 16 % — ABNORMAL HIGH (ref 11.5–15.5)
WBC: 14.7 10*3/uL — ABNORMAL HIGH (ref 4.0–10.5)
nRBC: 0.2 % (ref 0.0–0.2)

## 2020-04-28 LAB — GLUCOSE, CAPILLARY
Glucose-Capillary: 173 mg/dL — ABNORMAL HIGH (ref 70–99)
Glucose-Capillary: 176 mg/dL — ABNORMAL HIGH (ref 70–99)
Glucose-Capillary: 177 mg/dL — ABNORMAL HIGH (ref 70–99)
Glucose-Capillary: 196 mg/dL — ABNORMAL HIGH (ref 70–99)
Glucose-Capillary: 198 mg/dL — ABNORMAL HIGH (ref 70–99)
Glucose-Capillary: 210 mg/dL — ABNORMAL HIGH (ref 70–99)

## 2020-04-28 MED ORDER — QUETIAPINE FUMARATE 50 MG PO TABS
75.0000 mg | ORAL_TABLET | Freq: Every day | ORAL | Status: DC
  Administered 2020-04-29 – 2020-05-02 (×4): 75 mg
  Filled 2020-04-28 (×4): qty 1

## 2020-04-28 MED ORDER — DIAZEPAM 2 MG PO TABS
2.0000 mg | ORAL_TABLET | Freq: Every day | ORAL | Status: DC
  Administered 2020-04-28 – 2020-05-02 (×5): 2 mg
  Filled 2020-04-28 (×5): qty 1

## 2020-04-28 MED ORDER — CLONAZEPAM 1 MG PO TABS: 2.0000 mg | ORAL_TABLET | Freq: Every day | ORAL | Status: DC

## 2020-04-28 MED ORDER — QUETIAPINE FUMARATE 50 MG PO TABS
150.0000 mg | ORAL_TABLET | Freq: Every day | ORAL | Status: DC
  Administered 2020-04-28 – 2020-05-02 (×5): 150 mg
  Filled 2020-04-28 (×5): qty 1

## 2020-04-28 MED ORDER — LACTATED RINGERS IV SOLN: INTRAVENOUS | Status: AC

## 2020-04-28 MED ORDER — RESOURCE THICKENUP CLEAR PO POWD
Freq: Once | ORAL | Status: DC
  Filled 2020-04-28: qty 125

## 2020-04-28 MED ORDER — CLONAZEPAM 1 MG PO TABS
1.0000 mg | ORAL_TABLET | Freq: Three times a day (TID) | ORAL | Status: DC
  Administered 2020-04-28 – 2020-05-02 (×13): 1 mg
  Filled 2020-04-28 (×13): qty 1

## 2020-04-28 MED ORDER — CLONAZEPAM 1 MG PO TABS
1.0000 mg | ORAL_TABLET | Freq: Two times a day (BID) | ORAL | Status: DC
  Administered 2020-04-28: 1 mg
  Filled 2020-04-28: qty 1

## 2020-04-28 NOTE — Progress Notes (Signed)
Occupational Therapy Treatment Patient Details Name: Maria [name] MRN: [id number] DOB: [birth year] Today's Date: 04/28/2020    History of present illness 59 yo admitted with sepsis, pyelonephritis and encephalopathy. 11/18 Sz with continuous EEG. PMhx: epilepsy, intellectual disability, GERD   OT comments  This 59 yo female admitted with above seen today in conjunction with PT to see about progressing mobility. Pt more alert and participatory than on previous sessions, following commands more readily when paired with gestural cues as well, and more mobile overall as well. She will continue to benefit from acute OT with follow up at SNF  Follow Up Recommendations  SNF;Supervision/Assistance - 24 hour    Equipment Recommendations  Other (comment) (TBD next vneue)       Precautions / Restrictions Precautions Precautions: Fall Precaution Comments: cortrak Restrictions Weight Bearing Restrictions: No       Mobility Bed Mobility Overal bed mobility: Needs Assistance Bed Mobility: Supine to Sit     Supine to sit: Min assist     General bed mobility comments: increased time for all mobility  Transfers Overall transfer level: Needs assistance Equipment used: Rolling walker (2 wheeled);2 person hand held assist Transfers: Sit to/from Stand Sit to Stand: Min assist;+2 physical assistance         General transfer comment: Did better with more upright standing with RW (than with Bil HHA) and cues to look up at TV    Balance Overall balance assessment: Independent Sitting-balance support: No upper extremity supported;Feet supported Sitting balance-Leahy Scale: Fair   Postural control: Posterior lean Standing balance support: Bilateral upper extremity supported Standing balance-Leahy Scale: Poor                             ADL either performed or assessed with clinical judgement   ADL Overall ADL's : Needs assistance/impaired     Grooming: Wash/dry  face;Min guard;Sitting                   Toilet Transfer: Moderate assistance;+2 for physical assistance;Squat-pivot Toilet Transfer Details (indicate cue type and reason): bed>recliner on her right                 Vision   Additional Comments: continue to assess          Cognition Arousal/Alertness: Awake/alert Behavior During Therapy: Flat affect Overall Cognitive Status: Impaired/Different from baseline Area of Impairment: Orientation;Following commands;Safety/judgement;Problem solving                 Orientation Level:  (unable to state--all verbalizations are hard to understand)     Following Commands: Follows one step commands with increased time Safety/Judgement: Decreased awareness of safety   Problem Solving: Slow processing;Decreased initiation;Difficulty sequencing;Requires verbal cues;Requires tactile cues General Comments: followed 100% of commands if verbal was paired with gestural              General Comments Pt on RA, HR ranged from 125-135 during session    Pertinent Vitals/ Pain       Pain Assessment: Faces Faces Pain Scale: No hurt (no signs or symptoms of)     Prior Functioning/Environment              Frequency  Min 2X/week        Progress Toward Goals  OT Goals(current goals can now be found in the care plan section)  Progress towards OT goals: Progressing toward goals  Acute Rehab OT Goals Patient Stated  Goal: pt did not state OT Goal Formulation: Patient unable to participate in goal setting (due to cognition) Potential to Achieve Goals: Good  Plan Discharge plan remains appropriate    Co-evaluation    PT/OT/SLP Co-Evaluation/Treatment: Yes Reason for Co-Treatment: Complexity of the patient's impairments (multi-system involvement);Necessary to address cognition/behavior during functional activity;For patient/therapist safety;To address functional/ADL transfers PT goals addressed during session:  Mobility/safety with mobility;Balance;Proper use of DME;Strengthening/ROM OT goals addressed during session: Strengthening/ROM;ADL's and self-care      AM-PAC OT "6 Clicks" Daily Activity     Outcome Measure   Help from another person eating meals?: Total (cortrak) Help from another person taking care of personal grooming?: A Lot Help from another person toileting, which includes using toliet, bedpan, or urinal?: A Lot Help from another person bathing (including washing, rinsing, drying)?: A Lot Help from another person to put on and taking off regular upper body clothing?: A Lot Help from another person to put on and taking off regular lower body clothing?: Total 6 Click Score: 10    End of Session Equipment Utilized During Treatment: Gait belt  OT Visit Diagnosis: Unsteadiness on feet (R26.81);Other abnormalities of gait and mobility (R26.89);Muscle weakness (generalized) (M62.81);Other symptoms and signs involving cognitive function   Activity Tolerance Patient tolerated treatment well   Patient Left in chair;with call bell/phone within reach;with chair alarm set   Nurse Communication Mobility status        Time: 0768-0881 OT Time Calculation (min): 30 min  Charges: OT General Charges $OT Visit: 1 Visit OT Treatments $Self Care/Home Management : 8-22 mins  Golden Circle, OTR/L Acute [employer] Pager [phone] Office [phone]      Almon Register 04/28/2020, 11:39 AM

## 2020-04-28 NOTE — Progress Notes (Signed)
  Speech Language Pathology Treatment: Dysphagia  Patient Details Name: Maria [name] MRN: [id number] DOB: 1961/01/19 Today's Date: 04/28/2020 Time: 4166-0630 SLP Time Calculation (min) (ACUTE ONLY): 23 min  Assessment / Plan / Recommendation Clinical Impression  Pt had a great session this am.  She was alert, voice was much clearer and stronger and she was managing secretions much better.  Mucosa in oral cavity was clear of debris.  Pt masticated and swallowed multiple ice chips without difficulty.  Trials of thin liquid continued to elicit consistent coughing, however she tolerated honey-thick liquids and applesauce quite well.  With help to reposition and mitts removed, pt was able to feed herself 4 oz of applesauce with intermittent physical assistance for hand-to-mouth, but no s/s of aspiration.  Recommend beginning a dysphagia 1 diet with honey-thick liquids and meds crushed in puree.  Continue to allow ice chips BETWEEN MEALS but not during. Assist pt with self-feeding as able.  SLP will follow for safety/diet advancement.  D/W RN.   HPI HPI: 59 yo admitted 11/12 with sepsis, pyelonephritis and encephalopathy. PMhx: epilepsy, intellectual disability, GERD, EGD with dilation 2016.  Initial limited clinical swallow eval completed 11/17 with recs to continue NPO.  SLP s/o at that time due to ongoing lethargy.  Cortrak for nutrition.  Has been having breakthrough seizures.  Improved mentation and has been able to swallow some PO meds and ice chips.       SLP Plan  Continue with current plan of care       Recommendations  Diet recommendations: Dysphagia 1 (puree);Honey-thick liquid Liquids provided via: Cup Medication Administration: Crushed with puree Supervision: Staff to assist with self feeding Compensations: Minimize environmental distractions Postural Changes and/or Swallow Maneuvers: Seated upright 90 degrees                Oral Care Recommendations: Oral care  BID Follow up Recommendations: Skilled Nursing facility Plan: Continue with current plan of care       GO                [name] 04/28/2020, 7:56 AM  Estill Bamberg L. Tivis Ringer, [place] Office number [phone] Pager [phone]

## 2020-04-28 NOTE — Progress Notes (Signed)
Assisted tele visit to patient with family member.  Brysen Shankman Anderson, RN   

## 2020-04-28 NOTE — Progress Notes (Addendum)
Physical Therapy Treatment Patient Details Name: Maria [name] MRN: [id number] DOB: 10-05-1960 Today's Date: 04/28/2020    History of Present Illness 59 yo admitted with sepsis, pyelonephritis and encephalopathy. 11/18 Sz with continuous EEG. PMhx: epilepsy, intellectual disability, GERD    PT Comments    Pt pleasant and speaking but not answering questions stating "she owes me that" , unable to state name or place. Pt not consistently responding to verbal cues but when matched with demonstration/gestural she would follow commands. Pt able to stand repeatedly and pivot to chair but not yet able to walk. Will continue to follow and progress function.   HR 125-135 with activity SpO2 >93% on RA    Follow Up Recommendations  Supervision/Assistance - 24 hour;SNF     Equipment Recommendations  Rolling walker with 5" wheels    Recommendations for Other Services       Precautions / Restrictions Precautions Precautions: Fall Precaution Comments: cortrak Restrictions Weight Bearing Restrictions: No    Mobility  Bed Mobility Overal bed mobility: Needs Assistance Bed Mobility: Supine to Sit     Supine to sit: Min assist     General bed mobility comments: increased time for all mobility with HHA and gestures to complete  Transfers Overall transfer level: Needs assistance Equipment used: Rolling walker (2 wheeled);2 person hand held assist Transfers: Sit to/from [facility] Sit to Stand: Min assist;+2 physical assistance Stand pivot transfers: Mod assist;+2 safety/equipment       General transfer comment: pt stood x 3 from bed with bil UE support from therapists and x 2 from chair with RW present. pt performed better into upright standing with RW present. Pivot from bed to chair with mod +2 assist  Ambulation/Gait             General Gait Details: unable even with multimodal cues and RW present, weight shifted in standing but could not step away from  surface   Stairs             Wheelchair Mobility    Modified Rankin (Stroke Patients Only)       Balance Overall balance assessment: Needs assistance Sitting-balance support: No upper extremity supported;Feet supported Sitting balance-Leahy Scale: Fair   Postural control: Posterior lean Standing balance support: Bilateral upper extremity supported Standing balance-Leahy Scale: Poor                              Cognition Arousal/Alertness: Awake/alert Behavior During Therapy: Flat affect Overall Cognitive Status: Impaired/Different from baseline Area of Impairment: Following commands                 Orientation Level:  (unable to state--all verbalizations are hard to understand) Current Attention Level: Focused   Following Commands: Follows one step commands with increased time Safety/Judgement: Decreased awareness of safety   Problem Solving: Slow processing;Decreased initiation;Difficulty sequencing;Requires verbal cues;Requires tactile cues General Comments: followed 100% of commands if verbal was paired with gestural for basic transfers and movement not for gait      Exercises      General Comments General comments (skin integrity, edema, etc.): Pt on RA, HR ranged from 125-135 during session      Pertinent Vitals/Pain Pain Assessment: No/denies pain Faces Pain Scale: No hurt (no signs or symptoms of)    Home Living                      Prior  Function            PT Goals (current goals can now be found in the care plan section) Acute Rehab PT Goals Patient Stated Goal: pt did not state Progress towards PT goals: Progressing toward goals    Frequency    Min 3X/week      PT Plan Current plan remains appropriate    Co-evaluation PT/OT/SLP Co-Evaluation/Treatment: Yes Reason for Co-Treatment: Complexity of the patient's impairments (multi-system involvement);For patient/therapist safety;Necessary to address  cognition/behavior during functional activity PT goals addressed during session: Mobility/safety with mobility;Proper use of DME;Balance OT goals addressed during session: Strengthening/ROM;ADL's and self-care      AM-PAC PT "6 Clicks" Mobility   Outcome Measure  Help needed turning from your back to your side while in a flat bed without using bedrails?: A Little Help needed moving from lying on your back to sitting on the side of a flat bed without using bedrails?: A Little Help needed moving to and from a bed to a chair (including a wheelchair)?: A Lot Help needed standing up from a chair using your arms (e.g., wheelchair or bedside chair)?: A Little Help needed to walk in hospital room?: Total Help needed climbing 3-5 steps with a railing? : Total 6 Click Score: 13    End of Session Equipment Utilized During Treatment: Gait belt Activity Tolerance: Patient tolerated treatment well Patient left: in chair;with call bell/phone within reach;with chair alarm set Nurse Communication: Mobility status PT Visit Diagnosis: Other abnormalities of gait and mobility (R26.89)     Time: 2263-3354 PT Time Calculation (min) (ACUTE ONLY): 29 min  Charges:  $Therapeutic Activity: 8-22 mins                     Irvine Glorioso P, PT Acute Rehabilitation Services Pager: [phone] Office: [place] 04/28/2020, 1:10 PM

## 2020-04-28 NOTE — Progress Notes (Signed)
Family requests valium at night with leaving klonipin as ordered.

## 2020-04-28 NOTE — Progress Notes (Signed)
NAME:  Maria [name], MRN:  [id number], DOB:  January 17, 1961, LOS: 61 ADMISSION DATE:  04/14/2020, CONSULTATION DATE:  04/16/20 REFERRING MD:  Forestine Na, Dr. Wynetta Emery, CHIEF COMPLAINT:  sepsis   Brief History   59 year old woman with hx of epilepsy, intellectual disability, GERD, here with sepsis from pyelonephritis complicated by delirium and seizures.  Past Medical History  Intellectual disability Epilepsy Chronic constipation Dysphagia  Significant Hospital Events   11/12 admitted w/ septic shock and acute TME 11/15 PICC and CorTrak placed.  11/16 off pressors. Still agitated and delirious. On mono rx for ecoli 11/18 Seizure loaded on keppra Consults:  Neurology  Procedures:  RUE PICC 11/15  Significant Diagnostic Tests:  CT abdomen:  Findings suspicious for pyelonephritis and small abscess in lower pole of left kidney. Suggest correlation with urinalysis, and recommend continued follow-up by CT to confirm resolution. No evidence of ureteral calculi or hydronephrosis.  EEG: - Seizure, generalized - BIRDs - Spike and wave, generalized - Continuous slow, generalized  EKG: NSR Qtc 460  US renal 11/23 IMPRESSION: Stable hypoechoic lesion within the lower pole of the left kidney. While this may represent a small perinephric abscess, as suggested on the prior study, and infected renal cyst cannot be excluded.  Micro Data:  Urine cutlure:  (pansens) Ecoli  Blood culture: Negative  Antimicrobials:  Ceftriaxone 2g 11/12 --> Azithro 11/14 --11/15 Zosyn 11/14 --> 11/15  Interim history/subjective:  Still having trouble with sleeping. More lucid this am. WBC thankfully coming down.  Objective   Blood pressure 129/68, pulse (!) 122, temperature 99.8 F (37.7 C), resp. rate 20, height 5\' 6"  (1.676 m), weight 64.4 kg, SpO2 95 %.        Intake/Output Summary (Last 24 hours) at 04/28/2020 0947 Last data filed at 04/28/2020 0600 Gross per 24 hour  Intake  1355 ml  Output 351 ml  Net 1004 ml   Filed Weights   04/25/20 0600 04/26/20 0418 04/28/20 0144  Weight: 63.9 kg 64.4 kg 64.4 kg    Examination Constitutional: no acute distress  Eyes: pupils equal, tracking Ears, nose, mouth, and throat: cortrak in place, MM dry Cardiovascular: Slightly tachycardic, ext warm Respiratory: Improving air movement, no accessory muscle use Gastrointestinal: Soft, +BS Skin: No rashes, normal turgor Neurologic: moving all 4 ext to command Psychiatric: more clear thought processes today  BUN/Cr up slightly White count improved Afebrile   Resolved Hospital Problem list     Assessment & Plan:  Maria [name] is a 59 y.o. woman   Acute metabolic encephalopathy complicated by baseline intellectual disability & Hx of epilepsy Breakthrough seizures and postictal status resolved ICU delirium - Continue seroquel/klonipin, going to increase her nightly dose to help day/night cycles - Continue AEDs as ordered - PRNs available for breakthrough agitation  Acute hypoxic respiratory failure secondary to aspiration pneumonitis Improving O2 requirements - Encourage IS - CPT - Nebs  Constipation- resolved, bowel regimen PRN  Moderate Protein Calorie malnutrition, dysphagia - Continue tube feeds, monitor electrolytes - Appreciate SLP help, to start dysphagia diet today  Septic shock secondary to E Coli Pyelonephritis, probable small associated abscess- shock resolved - Repeat CT abd in about a month to re-assess  Best practice:  Diet: TF Pain/Anxiety/Delirium protocol (if indicated): see above  VAP protocol (if indicated): N/A DVT prophylaxis: lovenox 40mg  daily GI prophylaxis: Protonix  Glucose control: SSI Mobility: PT  Code Status: Full  Family Communication: updated sister this am Disposition: should be okay for  progressive, appreciate TRH taking over 11/27  Erskine Emery MD [facility] 04/28/2020 9:47 AM Personal  pager: [phone] If unanswered, please page CCM On-call: [phone]

## 2020-04-29 DIAGNOSIS — N12 Tubulo-interstitial nephritis, not specified as acute or chronic: Secondary | ICD-10-CM | POA: Diagnosis not present

## 2020-04-29 LAB — CBC
HCT: 27.5 % — ABNORMAL LOW (ref 36.0–46.0)
Hemoglobin: 8.2 g/dL — ABNORMAL LOW (ref 12.0–15.0)
MCH: 30.4 pg (ref 26.0–34.0)
MCHC: 29.8 g/dL — ABNORMAL LOW (ref 30.0–36.0)
MCV: 101.9 fL — ABNORMAL HIGH (ref 80.0–100.0)
Platelets: 383 10*3/uL (ref 150–400)
RBC: 2.7 MIL/uL — ABNORMAL LOW (ref 3.87–5.11)
RDW: 16.2 % — ABNORMAL HIGH (ref 11.5–15.5)
WBC: 14.3 10*3/uL — ABNORMAL HIGH (ref 4.0–10.5)
nRBC: 0.2 % (ref 0.0–0.2)

## 2020-04-29 LAB — BASIC METABOLIC PANEL
Anion gap: 15 (ref 5–15)
BUN: 34 mg/dL — ABNORMAL HIGH (ref 6–20)
CO2: 26 mmol/L (ref 22–32)
Calcium: 9.3 mg/dL (ref 8.9–10.3)
Chloride: 102 mmol/L (ref 98–111)
Creatinine, Ser: 0.82 mg/dL (ref 0.44–1.00)
GFR, Estimated: >60 mL/min (ref >60)
Glucose, Bld: 201 mg/dL — ABNORMAL HIGH (ref 70–99)
Potassium: 4.5 mmol/L (ref 3.5–5.1)
Sodium: 143 mmol/L (ref 135–145)

## 2020-04-29 LAB — GLUCOSE, CAPILLARY
Glucose-Capillary: 183 mg/dL — ABNORMAL HIGH (ref 70–99)
Glucose-Capillary: 179 mg/dL — ABNORMAL HIGH (ref 70–99)
Glucose-Capillary: 180 mg/dL — ABNORMAL HIGH (ref 70–99)
Glucose-Capillary: 188 mg/dL — ABNORMAL HIGH (ref 70–99)
Glucose-Capillary: 153 mg/dL — ABNORMAL HIGH (ref 70–99)

## 2020-04-29 LAB — PHOSPHORUS: Phosphorus: 4 mg/dL (ref 2.5–4.6)

## 2020-04-29 LAB — MAGNESIUM: Magnesium: 2 mg/dL (ref 1.7–2.4)

## 2020-04-29 NOTE — Progress Notes (Signed)
PROGRESS NOTE    Maria [name]  PPJ:[id number] DOB: February 17, 1961 DOA: 04/14/2020 PCP: Celene Squibb, MD    Brief Narrative:  Maria [name] is a 59 year old female with past medical history significant for epilepsy, intellectual delay, GERD, dysphagia/esophageal stricture, chronic diastolic congestive heart failure who was admitted on 04/14/2020 and transferred from [facility] to [facility], ICU on 04/16/2020 with septic shock secondary to E. coli UTI/left pyelonephritis complicated by breakthrough seizures.  Patient has been weaned from vasopressors and completed antibiotic course for E. coli/pyelonephritis septic shock.  Continues with waxing and waning mentation and dysphagia, currently requiring tube feeds for nutrition.  Transferred from PCCM service to hospitalist service on 04/29/2020.   Assessment & Plan:   Principal Problem:   Pyelonephritis of left kidney Active Problems:   Seizures (HCC)   GERD (gastroesophageal reflux disease)   Intellectual disability   Generalized convulsive epilepsy ([place])   DNR (do not resuscitate) discussion   Seizure disorder ([place])   Frequent falls   Renal abscess   Leukocytosis   Fever   LLQ abdominal pain   Dysphagia   AKI (acute kidney injury) ([place])   Sepsis secondary to UTI [facility])   Palliative care by specialist   Pressure injury of skin   Septic shock E. coli UTI Left pyelonephritis Patient initially presenting to the hospital with left lower quadrant abdominal pain and found to have a urinary tract infection with sepsis and was admitted for IV antibiotic treatment.  During her initial course, her blood pressure continued to decline and ultimately required transfer to [facility], ICU under the pulmonary critical care service for need of vasopressors.  CT abdomen/pelvis with findings suspicious for left-sided pyelonephritis and small abscess lower pole of left kidney.  Urine culture positive for E. coli.  Patient initially started  on azithromycin and Zosyn and completed course of ceftriaxone.  Patient was eventually titrated off of vasopressors and repeat urinalysis on 04/26/2020 with no growth. --Continue supportive care --Continue to monitor fever curve, CBC daily  Epilepsy with breakthrough seizures During initial hospitalization, patient developed seizure activity.  Etiology likely provoked from acute infectious process as above.  Patient was seen by neurology and started on her home antiepileptics.  She was monitored on EEG with no further seizure activity appreciated and generalized spike and wave discharges were likely consistent with patient's known history of generalized epilepsy.  Neurology signed off on 04/23/2020 with no further inpatient work-up indicated with recommendations of follow-up with outpatient neurology. --valproic acid 500 mg BID per tube --Vimpat 200mg  BID per tube --Seizure precautions --Ativan IV as needed breakthrough seizure  Dysphagia Hx esophageal stricture Patient follows with gastroenterology outpatient, Dr. Laural Golden.  Has required several esophageal dilations for strictures in the past. --SLP following, appreciate assistance --Currently cleared for dysphagia 1 diet per SLP; although nursing reports only 10% meals consumed and having difficulty swallowing pills/medications. --Continue tube feeds via cortrack, currently at goal with nutrition following --Continue SLP efforts in the hope of being able to transition tube feeds back to oral --If no significant improvement, may need to consider barium swallow evaluation to assess for stricture and may ultimately need PEG placement until oral intake/dysphagia improves --Aspiration precautions  Concern for left kidney abscess versus cyst Initial CT abdomen/pelvis notable for left kidney lower pole abscess measuring 2.5 x 2.4 cm.  Follow-up renal ultrasound 04/25/2020 shows stable hypoechoic lesion lower pole left kidney consistent with small  perinephric abscess versus renal cyst. --Completed course of IV antibiotics  as above --Continue to monitor fever curve, WBC count --Repeat CT abdomen/pelvis with contrast 1 month  Moderate protein calorie malnutrition Body mass index is 20.25 kg/m. Nutrition Status: Nutrition Problem: Inadequate oral intake Etiology: lethargy/confusion Signs/Symptoms: NPO status Interventions: Tube feeding --SLP/nutrition continues to follow, hopeful dysphagia and oral intake improves for transition off of tube feeds.  Chronic diastolic congestive heart failure TTE with LVEF 60 to 55%, grade 2 diastolic dysfunction, circumferential pericardial effusion, IVC normal in size, mild MR, no aortic stenosis.  Intellectual delay --Get up during the day to bedside chair --Encourage a familiar face to remain present throughout the day --Keep blinds open and lights on during daylight hours --Minimize the use of opioids --Seroquel 75 mg daily, 150 mg nightly --Diazepam 2 mg qHS --Clonazepam 1 mg TID  HLD: Continue atorvastatin 20 mg daily  GERD: Protonix 40 mg daily  Weakness/debility/deconditioning: --Continue PT/OT efforts while inpatient --Currently recommending home with 24-hour assistance versus SNF   DVT prophylaxis: Lovenox Code Status: Full code Family Communication: Updated Sister extensively present at bedside this morning  Disposition Plan:  Status is: Inpatient  Remains inpatient appropriate because:Altered mental status, Ongoing diagnostic testing needed not appropriate for outpatient work up, Unsafe d/c plan, IV treatments appropriate due to intensity of illness or inability to take PO and Inpatient level of care appropriate due to severity of illness   Dispo: The patient is from: Home              Anticipated d/c is to: To be determined, home with home health versus SNF              Anticipated d/c date is: > 3 days              Patient currently is not medically stable to  d/c.   Consultants:   Neurology, signed off 04/23/2020  PCCM, signed off 04/29/2020  Procedures:   Core track placement  RUE PICC 11/15  EEG:  Generalized seizure; BIRDs; spike and wave, generalized; continuous slow, generalized.  CT abdomen/pelvis: Findings suspicious for pyelonephritis and small abscess in lower pole of left kidney. Suggest correlation with urinalysis, and recommend continued follow-up by CT to confirm resolution. No evidence of ureteral calculi or hydronephrosis.  US Renal 11/23:  Stable hypoechoic lesion within the lower pole of the left kidney. While this may represent a small perinephric abscess, as suggested on the prior study, and infected renal cyst cannot be excluded.  Antimicrobials:   Azithromycin 11/14 - 11/15  Zosyn 11/14 -11/15  Ceftriaxone 11/12 - 11/25  Fluconazole 11/21 -11/21    Subjective: Patient seen and examined at bedside, resting comfortably in bed.  Sister present at bedside.  Nursing present.  Continues with some waxing and waning confusion.  Was more oriented earlier this morning at 5 AM per nursing and sister.  Continues with poor oral intake, roughly 10% of meals eaten yesterday after being cleared by speech therapy for dysphagia 1 diet.  Continues on tube feeds.  Sister concerned about redness on left upper back related to pressure from sheets and medical equipment, requesting dermal patch placement.  Discussed with sister regarding concerns for dysphagia and may need PEG tube placement versus further diagnostic tests given history of esophageal strictures requiring dilation in the past by GI.  No other questions or concerns at this time.  Denies chest pain, no shortness of breath, no abdominal pain.  No acute concerns per nursing staff.  Objective: Vitals:   04/29/20 0330 04/29/20 0400  04/29/20 0500 04/29/20 0600  BP:  (!) 129/56 120/89 (!) 121/59  Pulse:  (!) 112 (!) 113 (!) 112  Resp:  16 15 18   Temp: 99.3 F (37.4 C)      TempSrc: Oral     SpO2:  95% 97% 97%  Weight:      Height:        Intake/Output Summary (Last 24 hours) at 04/29/2020 1020 Last data filed at 04/29/2020 0907 Gross per 24 hour  Intake 2795.76 ml  Output 1094 ml  Net 1701.76 ml   Filed Weights   04/28/20 0144 04/28/20 2200 04/29/20 0313  Weight: 64.4 kg 56.9 kg 56.9 kg    Examination:  General exam: Appears calm and comfortable  Respiratory system: Clear to auscultation. Respiratory effort normal.  Oxygenating well on room air Cardiovascular system: S1 & S2 heard, tachycardic, regular rhythm. No JVD, murmurs, rubs, gallops or clicks. No pedal edema. Gastrointestinal system: Abdomen is nondistended, soft and nontender. No organomegaly or masses felt. Normal bowel sounds heard. Central nervous system: Alert, oriented to place hospital; and person but not time; per nursing was more oriented earlier this morning. No focal neurological deficits. Extremities: Symmetric 5 x 5 power. Skin: Small pressure lesion right upper back, otherwise no rashes, lesions or ulcers Psychiatry:  Mood & affect appropriate.     Data Reviewed: I have personally reviewed following labs and imaging studies  CBC: Recent Labs  Lab 04/25/20 0415 04/26/20 0410 04/27/20 0431 04/28/20 0500 04/29/20 0329  WBC 12.8* 13.4* 18.9* 14.7* 14.3*  HGB 7.8* 7.9* 9.7* 9.4* 8.2*  HCT 24.9* 25.2* 31.5* 29.9* 27.5*  MCV 98.8 100.0 99.1 100.0 101.9*  PLT 288 291 403* 419* 283   Basic Metabolic Panel: Recent Labs  Lab 04/23/20 0411 04/23/20 0411 04/24/20 0557 04/24/20 0557 04/25/20 0415 04/26/20 0410 04/27/20 0431 04/28/20 0500 04/29/20 0329  NA 139   < > 141   < > 141 137 137 140 143  K 3.5   < > 3.4*   < > 4.0 3.9 4.2 4.4 4.5  CL 101   < > 96*   < > 98 97* 96* 100 102  CO2 30   < > 27   < > 27 27 24 26 26   GLUCOSE 134*   < > 153*   < > 167* 189* 193* 202* 201*  BUN 14   < > 12   < > 19 19 27* 38* 34*  CREATININE 0.80   < > 0.87   < > 0.89 0.84  0.87 0.94 0.82  CALCIUM 8.8*   < > 9.3   < > 9.1 9.3 10.0 9.8 9.3  MG 1.9  --  1.7  --  2.4  --   --   --  2.0  PHOS  --   --   --   --   --   --   --   --  4.0   < > = values in this interval not displayed.   GFR: Estimated Creatinine Clearance: 67.2 mL/min (by C-G formula based on SCr of 0.82 mg/dL). Liver Function Tests: No results for input(s): AST, ALT, ALKPHOS, BILITOT, PROT, ALBUMIN in the last 168 hours. No results for input(s): LIPASE, AMYLASE in the last 168 hours. Recent Labs  Lab 04/25/20 0415  AMMONIA 43*   Coagulation Profile: No results for input(s): INR, PROTIME in the last 168 hours. Cardiac Enzymes: No results for input(s): CKTOTAL, CKMB, CKMBINDEX, TROPONINI in the last 168 hours.  BNP (last 3 results) No results for input(s): PROBNP in the last 8760 hours. HbA1C: No results for input(s): HGBA1C in the last 72 hours. CBG: Recent Labs  Lab 04/28/20 1517 04/28/20 1949 04/28/20 2344 04/29/20 0332 04/29/20 0805  GLUCAP 210* 196* 198* 183* 179*   Lipid Profile: No results for input(s): CHOL, HDL, LDLCALC, TRIG, CHOLHDL, LDLDIRECT in the last 72 hours. Thyroid Function Tests: No results for input(s): TSH, T4TOTAL, FREET4, T3FREE, THYROIDAB in the last 72 hours. Anemia Panel: No results for input(s): VITAMINB12, FOLATE, FERRITIN, TIBC, IRON, RETICCTPCT in the last 72 hours. Sepsis Labs: No results for input(s): PROCALCITON, LATICACIDVEN in the last 168 hours.  Recent Results (from the past 240 hour(s))  Culture, Urine     Status: None   Collection Time: 04/26/20  2:38 PM   Specimen: Urine, Random  Result Value Ref Range Status   Specimen Description URINE, RANDOM  Final   Special Requests NONE  Final   Culture   Final    NO GROWTH Performed at [facility], [address] [place], [place] [address]    Report Status 04/27/2020 FINAL  Final         Radiology Studies: No results found.      Scheduled Meds: . atorvastatin  20 mg Per  Tube QPM  . bisacodyl  10 mg Rectal Daily  . chlorhexidine  15 mL Mouth Rinse BID  . Chlorhexidine Gluconate Cloth  6 each Topical Daily  . clonazePAM  1 mg Per Tube TID  . diazepam  2 mg Per Tube QHS  . docusate  100 mg Per Tube BID  . enoxaparin (LOVENOX) injection  40 mg Subcutaneous Q24H  . feeding supplement (PROSource TF)  45 mL Per Tube TID  . insulin aspart  0-9 Units Subcutaneous Q4H  . lacosamide  200 mg Per Tube BID  . mouth rinse  15 mL Mouth Rinse q12n4p  . multivitamin with minerals  1 tablet Per Tube Daily  . pantoprazole sodium  40 mg Per Tube QHS  . polyethylene glycol  17 g Per Tube Daily  . QUEtiapine  150 mg Per Tube QHS  . QUEtiapine  75 mg Per Tube Daily  . Resource ThickenUp Clear   Oral Once  . sodium chloride flush  10-40 mL Intracatheter Q12H  . valproic acid  500 mg Per Tube BID   Continuous Infusions: . sodium chloride Stopped (04/23/20 2104)  . feeding supplement (OSMOLITE 1.5 CAL) 1,000 mL (04/29/20 0816)  . lactated ringers 75 mL/hr at 04/29/20 0600     LOS: 15 days    Time spent: 42 minutes spent on chart review, discussion with nursing staff, consultants, updating family and interview/physical exam; more than 50% of that time was spent in counseling and/or coordination of care.    Gertrude Tarbet J [place] DO [facility] Available via Epic secure chat 7am-7pm After these hours, please refer to coverage provider listed on [email or web address] 04/29/2020, 10:20 AM

## 2020-04-30 DIAGNOSIS — N12 Tubulo-interstitial nephritis, not specified as acute or chronic: Secondary | ICD-10-CM | POA: Diagnosis not present

## 2020-04-30 LAB — GLUCOSE, CAPILLARY
Glucose-Capillary: 117 mg/dL — ABNORMAL HIGH (ref 70–99)
Glucose-Capillary: 138 mg/dL — ABNORMAL HIGH (ref 70–99)
Glucose-Capillary: 142 mg/dL — ABNORMAL HIGH (ref 70–99)
Glucose-Capillary: 157 mg/dL — ABNORMAL HIGH (ref 70–99)
Glucose-Capillary: 157 mg/dL — ABNORMAL HIGH (ref 70–99)
Glucose-Capillary: 159 mg/dL — ABNORMAL HIGH (ref 70–99)

## 2020-04-30 LAB — PHOSPHORUS: Phosphorus: 4.6 mg/dL (ref 2.5–4.6)

## 2020-04-30 LAB — BASIC METABOLIC PANEL
Anion gap: 13 (ref 5–15)
BUN: 35 mg/dL — ABNORMAL HIGH (ref 6–20)
CO2: 28 mmol/L (ref 22–32)
Calcium: 9.8 mg/dL (ref 8.9–10.3)
Chloride: 102 mmol/L (ref 98–111)
Creatinine, Ser: 0.92 mg/dL (ref 0.44–1.00)
GFR, Estimated: >60 mL/min (ref >60)
Glucose, Bld: 207 mg/dL — ABNORMAL HIGH (ref 70–99)
Potassium: 4.5 mmol/L (ref 3.5–5.1)
Sodium: 143 mmol/L (ref 135–145)

## 2020-04-30 LAB — CBC
HCT: 28.7 % — ABNORMAL LOW (ref 36.0–46.0)
Hemoglobin: 8.7 g/dL — ABNORMAL LOW (ref 12.0–15.0)
MCH: 31.2 pg (ref 26.0–34.0)
MCHC: 30.3 g/dL (ref 30.0–36.0)
MCV: 102.9 fL — ABNORMAL HIGH (ref 80.0–100.0)
Platelets: 433 10*3/uL — ABNORMAL HIGH (ref 150–400)
RBC: 2.79 MIL/uL — ABNORMAL LOW (ref 3.87–5.11)
RDW: 16 % — ABNORMAL HIGH (ref 11.5–15.5)
WBC: 13.6 10*3/uL — ABNORMAL HIGH (ref 4.0–10.5)
nRBC: 0.2 % (ref 0.0–0.2)

## 2020-04-30 LAB — MAGNESIUM: Magnesium: 2.3 mg/dL (ref 1.7–2.4)

## 2020-04-30 MED ORDER — METOPROLOL TARTRATE 12.5 MG HALF TABLET
12.5000 mg | ORAL_TABLET | Freq: Two times a day (BID) | ORAL | Status: DC
  Administered 2020-04-30 – 2020-05-01 (×3): 12.5 mg
  Filled 2020-04-30 (×3): qty 1

## 2020-04-30 MED ORDER — ALTEPLASE 2 MG IJ SOLR
2.0000 mg | Freq: Once | INTRAMUSCULAR | Status: AC
  Administered 2020-04-30: 2 mg
  Filled 2020-04-30: qty 2

## 2020-04-30 NOTE — Progress Notes (Signed)
PROGRESS NOTE    Maria [name]  WLS:[id number] DOB: 28-Sep-1960 DOA: 04/14/2020 PCP: Celene Squibb, MD    Brief Narrative:  Maria [name] is a 59 year old female with past medical history significant for epilepsy, intellectual delay, GERD, dysphagia/esophageal stricture, chronic diastolic congestive heart failure who was admitted on 04/14/2020 and transferred from [facility] to [facility], ICU on 04/16/2020 with septic shock secondary to E. coli UTI/left pyelonephritis complicated by breakthrough seizures.  Patient has been weaned from vasopressors and completed antibiotic course for E. coli/pyelonephritis septic shock.  Continues with waxing and waning mentation and dysphagia, currently requiring tube feeds for nutrition.  Transferred from PCCM service to hospitalist service on 04/29/2020.   Assessment & Plan:   Principal Problem:   Pyelonephritis of left kidney Active Problems:   Seizures (HCC)   GERD (gastroesophageal reflux disease)   Intellectual disability   Generalized convulsive epilepsy ([place])   DNR (do not resuscitate) discussion   Seizure disorder ([place])   Frequent falls   Renal abscess   Leukocytosis   Fever   LLQ abdominal pain   Dysphagia   AKI (acute kidney injury) ([place])   Sepsis secondary to UTI [facility])   Palliative care by specialist   Pressure injury of skin   Septic shock E. coli UTI Left pyelonephritis Patient initially presenting to the hospital with left lower quadrant abdominal pain and found to have a urinary tract infection with sepsis and was admitted for IV antibiotic treatment.  During her initial course, her blood pressure continued to decline and ultimately required transfer to [facility], ICU under the pulmonary critical care service for need of vasopressors.  CT abdomen/pelvis with findings suspicious for left-sided pyelonephritis and small abscess lower pole of left kidney.  Urine culture positive for E. coli.  Patient initially started  on azithromycin and Zosyn and completed course of ceftriaxone.  Patient was eventually titrated off of vasopressors and repeat urinalysis on 04/26/2020 with no growth. --Leukocytosis continues to downtrend --Continue supportive care --Continue to monitor fever curve, CBC daily  Epilepsy with breakthrough seizures During initial hospitalization, patient developed seizure activity.  Etiology likely provoked from acute infectious process as above.  Patient was seen by neurology and started on her home antiepileptics.  She was monitored on EEG with no further seizure activity appreciated and generalized spike and wave discharges were likely consistent with patient's known history of generalized epilepsy.  Neurology signed off on 04/23/2020 with no further inpatient work-up indicated with recommendations of follow-up with outpatient neurology. --valproic acid 500 mg BID per tube --Vimpat 200mg  BID per tube --Seizure precautions --Ativan IV as needed breakthrough seizure  Dysphagia Hx esophageal stricture Patient follows with gastroenterology outpatient, Dr. Laural Golden.  Has required several esophageal dilations for strictures in the past. --SLP following, appreciate assistance --Currently cleared for dysphagia 1 diet per SLP; although nursing reports only 10% meals consumed and having difficulty swallowing pills/medications; but seems to be tolerating liquids/drinks well --Continue tube feeds via cortrack, currently at goal with nutrition following --Continue SLP efforts in the hope of being able to transition tube feeds back to oral --If no significant improvement, may need to consider barium swallow evaluation to assess for stricture and may ultimately need PEG placement until oral intake/dysphagia improves --Aspiration precautions  Concern for left kidney abscess versus cyst Initial CT abdomen/pelvis notable for left kidney lower pole abscess measuring 2.5 x 2.4 cm.  Follow-up renal ultrasound  04/25/2020 shows stable hypoechoic lesion lower pole left kidney consistent with  small perinephric abscess versus renal cyst. --Completed course of IV antibiotics as above --Continue to monitor fever curve, WBC count which is downtrending --Repeat CT abdomen/pelvis with contrast 1 month  Moderate protein calorie malnutrition Body mass index is 20.25 kg/m. Nutrition Status: Nutrition Problem: Inadequate oral intake Etiology: lethargy/confusion Signs/Symptoms: NPO status Interventions: Tube feeding --SLP/nutrition continues to follow, hopeful dysphagia and oral intake improves for transition off of tube feeds.  Chronic diastolic congestive heart failure TTE with LVEF 60 to 61%, grade 2 diastolic dysfunction, circumferential pericardial effusion, IVC normal in size, mild MR, no aortic stenosis. --Metoprolol 12.5 mg p.o. twice daily  Intellectual delay --Get up during the day to bedside chair --Encourage a familiar face to remain present throughout the day --Keep blinds open and lights on during daylight hours --Minimize the use of opioids --Seroquel 75 mg daily, 150 mg nightly --Diazepam 2 mg qHS --Clonazepam 1 mg TID  HLD: Continue atorvastatin 20 mg daily  GERD: Protonix 40 mg daily  Weakness/debility/deconditioning: --Continue PT/OT efforts while inpatient --Currently recommending home with 24-hour assistance versus SNF   DVT prophylaxis: Lovenox Code Status: Full code Family Communication: Updated Sister extensively present at bedside this morning  Disposition Plan:  Status is: Inpatient  Remains inpatient appropriate because:Altered mental status, Ongoing diagnostic testing needed not appropriate for outpatient work up, Unsafe d/c plan, IV treatments appropriate due to intensity of illness or inability to take PO and Inpatient level of care appropriate due to severity of illness   Dispo: The patient is from: Home              Anticipated d/c is to: To be determined,  home with home health versus SNF              Anticipated d/c date is: > 3 days              Patient currently is not medically stable to d/c.   Consultants:   Neurology, signed off 04/23/2020  PCCM, signed off 04/29/2020  Procedures:   Core track placement  RUE PICC 11/15  EEG:  Generalized seizure; BIRDs; spike and wave, generalized; continuous slow, generalized.  CT abdomen/pelvis: Findings suspicious for pyelonephritis and small abscess in lower pole of left kidney. Suggest correlation with urinalysis, and recommend continued follow-up by CT to confirm resolution. No evidence of ureteral calculi or hydronephrosis.  US Renal 11/23:  Stable hypoechoic lesion within the lower pole of the left kidney. While this may represent a small perinephric abscess, as suggested on the prior study, and infected renal cyst cannot be excluded.  Antimicrobials:   Azithromycin 11/14 - 11/15  Zosyn 11/14 -11/15  Ceftriaxone 11/12 - 11/25  Fluconazole 11/21 -11/21    Subjective: Patient seen and examined at bedside, resting comfortably.  Sister present and updated.  Seems to be tolerating liquids/drinks without issue.  Tolerated some cranberry sauce this morning, but no other attempts at solid foods.  Sister inquiring into follow-up therapy and speech evaluations.  No other questions or concerns at this time.  No headache, no dizziness, no chest pain, shortness of breath, no abdominal pain.  No acute concerns overnight per nursing staff.  Objective: Vitals:   04/30/20 0744 04/30/20 0746 04/30/20 1215 04/30/20 1218  BP: (!) 89/74 129/78 90/66 117/84  Pulse:  (!) 118 (!) 118 (!) 117  Resp: (!) 23  (!) 30 20  Temp: 100.2 F (37.9 C) 100.2 F (37.9 C) 99.8 F (37.7 C) 99.8 F (37.7 C)  TempSrc: Axillary Axillary  Axillary Axillary  SpO2: 97% 99% 98% 97%  Weight:      Height:        Intake/Output Summary (Last 24 hours) at 04/30/2020 1303 Last data filed at 04/30/2020  0757 Gross per 24 hour  Intake 467.69 ml  Output --  Net 467.69 ml   Filed Weights   04/28/20 0144 04/28/20 2200 04/29/20 0313  Weight: 64.4 kg 56.9 kg 56.9 kg    Examination:  General exam: Appears calm and comfortable  Respiratory system: Clear to auscultation. Respiratory effort normal.  Oxygenating well on room air Cardiovascular system: S1 & S2 heard, tachycardic, regular rhythm. No JVD, murmurs, rubs, gallops or clicks. No pedal edema. Gastrointestinal system: Abdomen is nondistended, soft and nontender. No organomegaly or masses felt. Normal bowel sounds heard.  Core track tube noted in place Central nervous system: Alert, oriented to place hospital; and person but not time; per nursing was more oriented earlier this morning. No focal neurological deficits. Extremities: Symmetric 5 x 5 power. Skin: Small pressure lesion right upper back, otherwise no rashes, lesions or ulcers Psychiatry:  Mood & affect appropriate.     Data Reviewed: I have personally reviewed following labs and imaging studies  CBC: Recent Labs  Lab 04/26/20 0410 04/27/20 0431 04/28/20 0500 04/29/20 0329 04/30/20 0325  WBC 13.4* 18.9* 14.7* 14.3* 13.6*  HGB 7.9* 9.7* 9.4* 8.2* 8.7*  HCT 25.2* 31.5* 29.9* 27.5* 28.7*  MCV 100.0 99.1 100.0 101.9* 102.9*  PLT 291 403* 419* 383 384*   Basic Metabolic Panel: Recent Labs  Lab 04/24/20 0557 04/24/20 0557 04/25/20 0415 04/25/20 0415 04/26/20 0410 04/27/20 0431 04/28/20 0500 04/29/20 0329 04/30/20 0325  NA 141   < > 141   < > 137 137 140 143 143  K 3.4*   < > 4.0   < > 3.9 4.2 4.4 4.5 4.5  CL 96*   < > 98   < > 97* 96* 100 102 102  CO2 27   < > 27   < > 27 24 26 26 28   GLUCOSE 153*   < > 167*   < > 189* 193* 202* 201* 207*  BUN 12   < > 19   < > 19 27* 38* 34* 35*  CREATININE 0.87   < > 0.89   < > 0.84 0.87 0.94 0.82 0.92  CALCIUM 9.3   < > 9.1   < > 9.3 10.0 9.8 9.3 9.8  MG 1.7  --  2.4  --   --   --   --  2.0 2.3  PHOS  --   --   --   --    --   --   --  4.0 4.6   < > = values in this interval not displayed.   GFR: Estimated Creatinine Clearance: 59.9 mL/min (by C-G formula based on SCr of 0.92 mg/dL). Liver Function Tests: No results for input(s): AST, ALT, ALKPHOS, BILITOT, PROT, ALBUMIN in the last 168 hours. No results for input(s): LIPASE, AMYLASE in the last 168 hours. Recent Labs  Lab 04/25/20 0415  AMMONIA 43*   Coagulation Profile: No results for input(s): INR, PROTIME in the last 168 hours. Cardiac Enzymes: No results for input(s): CKTOTAL, CKMB, CKMBINDEX, TROPONINI in the last 168 hours. BNP (last 3 results) No results for input(s): PROBNP in the last 8760 hours. HbA1C: No results for input(s): HGBA1C in the last 72 hours. CBG: Recent Labs  Lab 04/29/20 2006 04/30/20 0006 04/30/20 5364  04/30/20 0750 04/30/20 1212  GLUCAP 153* 157* 159* 138* 117*   Lipid Profile: No results for input(s): CHOL, HDL, LDLCALC, TRIG, CHOLHDL, LDLDIRECT in the last 72 hours. Thyroid Function Tests: No results for input(s): TSH, T4TOTAL, FREET4, T3FREE, THYROIDAB in the last 72 hours. Anemia Panel: No results for input(s): VITAMINB12, FOLATE, FERRITIN, TIBC, IRON, RETICCTPCT in the last 72 hours. Sepsis Labs: No results for input(s): PROCALCITON, LATICACIDVEN in the last 168 hours.  Recent Results (from the past 240 hour(s))  Culture, Urine     Status: None   Collection Time: 04/26/20  2:38 PM   Specimen: Urine, Random  Result Value Ref Range Status   Specimen Description URINE, RANDOM  Final   Special Requests NONE  Final   Culture   Final    NO GROWTH Performed at [facility], [address], [place], [place] [address]    Report Status 04/27/2020 FINAL  Final         Radiology Studies: No results found.      Scheduled Meds: . atorvastatin  20 mg Per Tube QPM  . bisacodyl  10 mg Rectal Daily  . chlorhexidine  15 mL Mouth Rinse BID  . Chlorhexidine Gluconate Cloth  6 each Topical Daily   . clonazePAM  1 mg Per Tube TID  . diazepam  2 mg Per Tube QHS  . docusate  100 mg Per Tube BID  . enoxaparin (LOVENOX) injection  40 mg Subcutaneous Q24H  . feeding supplement (PROSource TF)  45 mL Per Tube TID  . insulin aspart  0-9 Units Subcutaneous Q4H  . lacosamide  200 mg Per Tube BID  . mouth rinse  15 mL Mouth Rinse q12n4p  . metoprolol tartrate  12.5 mg Per Tube BID  . multivitamin with minerals  1 tablet Per Tube Daily  . pantoprazole sodium  40 mg Per Tube QHS  . polyethylene glycol  17 g Per Tube Daily  . QUEtiapine  150 mg Per Tube QHS  . QUEtiapine  75 mg Per Tube Daily  . Resource ThickenUp Clear   Oral Once  . sodium chloride flush  10-40 mL Intracatheter Q12H  . valproic acid  500 mg Per Tube BID   Continuous Infusions: . sodium chloride Stopped (04/23/20 2104)  . feeding supplement (OSMOLITE 1.5 CAL) 50 mL/hr at 04/29/20 1902     LOS: 16 days    Time spent: 38 minutes spent on chart review, discussion with nursing staff, consultants, updating family and interview/physical exam; more than 50% of that time was spent in counseling and/or coordination of care.    Deni Berti J [place] DO [facility] Available via Epic secure chat 7am-7pm After these hours, please refer to coverage provider listed on [email or web address] 04/30/2020, 1:03 PM

## 2020-05-01 ENCOUNTER — Inpatient Hospital Stay (HOSPITAL_COMMUNITY): Payer: Medicare Other

## 2020-05-01 DIAGNOSIS — N12 Tubulo-interstitial nephritis, not specified as acute or chronic: Secondary | ICD-10-CM | POA: Diagnosis not present

## 2020-05-01 LAB — BASIC METABOLIC PANEL
Anion gap: 14 (ref 5–15)
BUN: 34 mg/dL — ABNORMAL HIGH (ref 6–20)
CO2: 27 mmol/L (ref 22–32)
Calcium: 9.6 mg/dL (ref 8.9–10.3)
Chloride: 100 mmol/L (ref 98–111)
Creatinine, Ser: 0.8 mg/dL (ref 0.44–1.00)
GFR, Estimated: >60 mL/min (ref >60)
Glucose, Bld: 180 mg/dL — ABNORMAL HIGH (ref 70–99)
Potassium: 4 mmol/L (ref 3.5–5.1)
Sodium: 141 mmol/L (ref 135–145)

## 2020-05-01 LAB — CBC
HCT: 27.8 % — ABNORMAL LOW (ref 36.0–46.0)
Hemoglobin: 8.4 g/dL — ABNORMAL LOW (ref 12.0–15.0)
MCH: 30.7 pg (ref 26.0–34.0)
MCHC: 30.2 g/dL (ref 30.0–36.0)
MCV: 101.5 fL — ABNORMAL HIGH (ref 80.0–100.0)
Platelets: 394 10*3/uL (ref 150–400)
RBC: 2.74 MIL/uL — ABNORMAL LOW (ref 3.87–5.11)
RDW: 15.9 % — ABNORMAL HIGH (ref 11.5–15.5)
WBC: 13.3 10*3/uL — ABNORMAL HIGH (ref 4.0–10.5)
nRBC: 0.2 % (ref 0.0–0.2)

## 2020-05-01 LAB — PHOSPHORUS: Phosphorus: 4.7 mg/dL — ABNORMAL HIGH (ref 2.5–4.6)

## 2020-05-01 LAB — GLUCOSE, CAPILLARY
Glucose-Capillary: 138 mg/dL — ABNORMAL HIGH (ref 70–99)
Glucose-Capillary: 158 mg/dL — ABNORMAL HIGH (ref 70–99)
Glucose-Capillary: 159 mg/dL — ABNORMAL HIGH (ref 70–99)
Glucose-Capillary: 170 mg/dL — ABNORMAL HIGH (ref 70–99)
Glucose-Capillary: 183 mg/dL — ABNORMAL HIGH (ref 70–99)
Glucose-Capillary: 186 mg/dL — ABNORMAL HIGH (ref 70–99)
Glucose-Capillary: 204 mg/dL — ABNORMAL HIGH (ref 70–99)

## 2020-05-01 LAB — MAGNESIUM: Magnesium: 2.3 mg/dL (ref 1.7–2.4)

## 2020-05-01 MED ORDER — METOPROLOL TARTRATE 25 MG PO TABS
25.0000 mg | ORAL_TABLET | Freq: Two times a day (BID) | ORAL | Status: DC
  Administered 2020-05-02 (×2): 25 mg
  Filled 2020-05-01 (×3): qty 1

## 2020-05-01 MED ORDER — ENSURE ENLIVE PO LIQD
237.0000 mL | Freq: Three times a day (TID) | ORAL | Status: DC
  Administered 2020-05-01 – 2020-05-05 (×6): 237 mL via ORAL

## 2020-05-01 NOTE — Progress Notes (Signed)
[place] fired yellow @ 0755 due to increased HR. Scheduled metoprolol given. Will continue to monitor patient.

## 2020-05-01 NOTE — Progress Notes (Signed)
  Speech Language Pathology Treatment: Dysphagia  Patient Details Name: Maria [name] MRN: [id number] DOB: 1961-03-17 Today's Date: 05/01/2020 Time: 1000-1012 SLP Time Calculation (min) (ACUTE ONLY): 12 min  Assessment / Plan / Recommendation Clinical Impression  Pt was quite sleepy today, and required tactile/verbal cues, wet washcloth to awaken to participate.  She consumed six ounces of thin water today for the first time with NO s/s of aspiration. Despite being drowsy, there was good oral attention, normal lip seal around the straw; she swallowed repetitive times with seemingly brisk swallow response and no s/s of airway invasion. Recommend continue purees/dysphagia 1 for now, advance liquids to thin.  Consider pulling cortrak given that biomechanics of the swallow are so much better.  Once she is more alert we can advance her solids. D/W RN.    HPI HPI: 59 yo admitted 11/12 with sepsis, pyelonephritis and encephalopathy. PMhx: epilepsy, intellectual disability, GERD, EGD with dilation 2016.  Initial limited clinical swallow eval completed 11/17 with recs to continue NPO.  SLP s/o at that time due to ongoing lethargy.  Cortrak for nutrition.  Has been having breakthrough seizures.  Improved mentation and has been able to swallow some PO meds and ice chips.       SLP Plan  Continue with current plan of care       Recommendations  Diet recommendations: Dysphagia 1 (puree);Thin liquid Liquids provided via: Cup;Straw Medication Administration: Whole meds with puree Supervision: Staff to assist with self feeding Compensations: Minimize environmental distractions Postural Changes and/or Swallow Maneuvers: Seated upright 90 degrees                Oral Care Recommendations: Oral care BID Follow up Recommendations: Skilled Nursing facility SLP Visit Diagnosis: Dysphagia, unspecified (R13.10) Plan: Continue with current plan of care       GO                [name] 05/01/2020, 10:15 AM  Estill Bamberg L. Tivis Ringer, [place] Office number [phone] Pager [phone]

## 2020-05-01 NOTE — Progress Notes (Signed)
PROGRESS NOTE    RIELEY KHALSA  HCW:[id number] DOB: Jul 27, 1960 DOA: 04/14/2020 PCP: Celene Squibb, MD    Brief Narrative:  Maria [name] is a 59 year old female with past medical history significant for epilepsy, intellectual delay, GERD, dysphagia/esophageal stricture, chronic diastolic congestive heart failure who was admitted on 04/14/2020 and transferred from [facility] to [facility], ICU on 04/16/2020 with septic shock secondary to E. coli UTI/left pyelonephritis complicated by breakthrough seizures.  Patient has been weaned from vasopressors and completed antibiotic course for E. coli/pyelonephritis septic shock.  Continues with waxing and waning mentation and dysphagia, currently requiring tube feeds for nutrition.  Transferred from PCCM service to hospitalist service on 04/29/2020.   Assessment & Plan:   Principal Problem:   Pyelonephritis of left kidney Active Problems:   Seizures (HCC)   GERD (gastroesophageal reflux disease)   Intellectual disability   Generalized convulsive epilepsy ([place])   DNR (do not resuscitate) discussion   Seizure disorder ([place])   Frequent falls   Renal abscess   Leukocytosis   Fever   LLQ abdominal pain   Dysphagia   AKI (acute kidney injury) ([place])   Sepsis secondary to UTI [facility])   Palliative care by specialist   Pressure injury of skin   Septic shock E. coli UTI Left pyelonephritis Patient initially presenting to the hospital with left lower quadrant abdominal pain and found to have a urinary tract infection with sepsis and was admitted for IV antibiotic treatment.  During her initial course, her blood pressure continued to decline and ultimately required transfer to [facility], ICU under the pulmonary critical care service for need of vasopressors.  CT abdomen/pelvis with findings suspicious for left-sided pyelonephritis and small abscess lower pole of left kidney.  Urine culture positive for E. coli.  Patient initially started  on azithromycin and Zosyn and completed course of ceftriaxone.  Patient was eventually titrated off of vasopressors and repeat urinalysis on 04/26/2020 with no growth. --Leukocytosis continues to downtrend --Continue supportive care --Continue to monitor fever curve, CBC daily  Epilepsy with breakthrough seizures During initial hospitalization, patient developed seizure activity.  Etiology likely provoked from acute infectious process as above.  Patient was seen by neurology and started on her home antiepileptics.  She was monitored on EEG with no further seizure activity appreciated and generalized spike and wave discharges were likely consistent with patient's known history of generalized epilepsy.  Neurology signed off on 04/23/2020 with no further inpatient work-up indicated with recommendations of follow-up with outpatient neurology. --valproic acid 500 mg BID per tube --Vimpat 200mg  BID per tube --Seizure precautions --Ativan IV as needed breakthrough seizure  Dysphagia Hx esophageal stricture Patient follows with gastroenterology outpatient, Dr. Laural Golden.  Has required several esophageal dilations for strictures in the past. --SLP following, appreciate assistance --Currently cleared for dysphagia 1 diet with thin liquids per SLP; although nursing reports poor oral intake; given swallow improving; hopeful to dc cortrack next 1-2 days  Concern for left kidney abscess versus cyst Initial CT abdomen/pelvis notable for left kidney lower pole abscess measuring 2.5 x 2.4 cm.  Follow-up renal ultrasound 04/25/2020 shows stable hypoechoic lesion lower pole left kidney consistent with small perinephric abscess versus renal cyst. --Completed course of IV antibiotics as above --Continue to monitor fever curve, WBC count which is downtrending --Repeat CT abdomen/pelvis with contrast 1 month  Moderate protein calorie malnutrition Body mass index is 20.25 kg/m. Nutrition Status: Nutrition Problem:  Inadequate oral intake Etiology: lethargy/confusion Signs/Symptoms: NPO status Interventions:  Tube feeding --SLP/nutrition continues to follow, hopeful dysphagia and oral intake improves for transition off of tube feeds.  Chronic diastolic congestive heart failure TTE with LVEF 60 to 09%, grade 2 diastolic dysfunction, circumferential pericardial effusion, IVC normal in size, mild MR, no aortic stenosis. --Metoprolol 12.5 mg p.o. twice daily  Intellectual delay --Get up during the day to bedside chair --Encourage a familiar face to remain present throughout the day --Keep blinds open and lights on during daylight hours --Minimize the use of opioids --Seroquel 75 mg daily, 150 mg nightly --Diazepam 2 mg qHS --Clonazepam 1 mg TID  HLD: Continue atorvastatin 20 mg daily  GERD: Protonix 40 mg daily  Weakness/debility/deconditioning: --Continue PT/OT efforts while inpatient --Currently recommending home with 24-hour assistance versus SNF   DVT prophylaxis: Lovenox Code Status: Full code Family Communication: no family present at bedside this am  Disposition Plan:  Status is: Inpatient  Remains inpatient appropriate because:Altered mental status, Ongoing diagnostic testing needed not appropriate for outpatient work up, Unsafe d/c plan, IV treatments appropriate due to intensity of illness or inability to take PO and Inpatient level of care appropriate due to severity of illness   Dispo: The patient is from: Home              Anticipated d/c is to: To be determined, home with home health versus SNF              Anticipated d/c date is: > 3 days              Patient currently is not medically stable to d/c.   Consultants:   Neurology, signed off 04/23/2020  PCCM, signed off 04/29/2020  Procedures:   Core track placement  RUE PICC 11/15  EEG:  Generalized seizure; BIRDs; spike and wave, generalized; continuous slow, generalized.  CT abdomen/pelvis: Findings  suspicious for pyelonephritis and small abscess in lower pole of left kidney. Suggest correlation with urinalysis, and recommend continued follow-up by CT to confirm resolution. No evidence of ureteral calculi or hydronephrosis.  US Renal 11/23:  Stable hypoechoic lesion within the lower pole of the left kidney. While this may represent a small perinephric abscess, as suggested on the prior study, and infected renal cyst cannot be excluded.  Antimicrobials:   Azithromycin 11/14 - 11/15  Zosyn 11/14 -11/15  Ceftriaxone 11/12 - 11/25  Fluconazole 11/21 -11/21    Subjective: Patient seen and examined at bedside, resting comfortably.  Currently receiving bed bath by nurse tech.  Seen by speech therapy this morning with advancement of diet to thin liquids.  Hopeful to be able to discontinue tube feeds over next few days given that her swallowing is improved without signs of aspiration today.  No family present at bedside this morning.  Patient complaining of some abdominal discomfort.  Review of EMR notes last BM 11/26, although her primary nurse today does report a bowel movement yesterday that was undocumented.  X-ray chest/abdomen shows resolving pneumonia and no acute intra-abdominal findings.  Patient denies headache, no chest pain, no shortness of breath.  No other concerns overnight per nursing staff.  Objective: Vitals:   05/01/20 0758 05/01/20 0801 05/01/20 1027 05/01/20 1112  BP: 124/62 140/74  (!) 117/59  Pulse: (!) 112 (!) 112  (!) 105  Resp: 20 20 20 20   Temp:    98.3 F (36.8 C)  TempSrc:    Oral  SpO2: 96% 96%  96%  Weight:      Height:  Intake/Output Summary (Last 24 hours) at 05/01/2020 1157 Last data filed at 05/01/2020 0427 Gross per 24 hour  Intake --  Output 1500 ml  Net -1500 ml   Filed Weights   04/28/20 0144 04/28/20 2200 04/29/20 0313  Weight: 64.4 kg 56.9 kg 56.9 kg    Examination:  General exam: Appears calm and comfortable  Respiratory  system: Clear to auscultation. Respiratory effort normal.  Oxygenating well on room air Cardiovascular system: S1 & S2 heard, tachycardic, regular rhythm. No JVD, murmurs, rubs, gallops or clicks. No pedal edema. Gastrointestinal system: Abdomen is nondistended, soft and nontender. No organomegaly or masses felt. Normal bowel sounds heard.  Core track tube noted in place Central nervous system: Alert, oriented to place hospital; and person but not time; per nursing was more oriented earlier this morning. No focal neurological deficits. Extremities: Symmetric 5 x 5 power. Skin: Small pressure lesion right upper back, otherwise no rashes, lesions or ulcers Psychiatry:  Mood & affect appropriate.     Data Reviewed: I have personally reviewed following labs and imaging studies  CBC: Recent Labs  Lab 04/27/20 0431 04/28/20 0500 04/29/20 0329 04/30/20 0325 05/01/20 0400  WBC 18.9* 14.7* 14.3* 13.6* 13.3*  HGB 9.7* 9.4* 8.2* 8.7* 8.4*  HCT 31.5* 29.9* 27.5* 28.7* 27.8*  MCV 99.1 100.0 101.9* 102.9* 101.5*  PLT 403* 419* 383 433* 010   Basic Metabolic Panel: Recent Labs  Lab 04/25/20 0415 04/26/20 0410 04/27/20 0431 04/28/20 0500 04/29/20 0329 04/30/20 0325 05/01/20 0400  NA 141   < > 137 140 143 143 141  K 4.0   < > 4.2 4.4 4.5 4.5 4.0  CL 98   < > 96* 100 102 102 100  CO2 27   < > 24 26 26 28 27   GLUCOSE 167*   < > 193* 202* 201* 207* 180*  BUN 19   < > 27* 38* 34* 35* 34*  CREATININE 0.89   < > 0.87 0.94 0.82 0.92 0.80  CALCIUM 9.1   < > 10.0 9.8 9.3 9.8 9.6  MG 2.4  --   --   --  2.0 2.3 2.3  PHOS  --   --   --   --  4.0 4.6 4.7*   < > = values in this interval not displayed.   GFR: Estimated Creatinine Clearance: 68.9 mL/min (by C-G formula based on SCr of 0.8 mg/dL). Liver Function Tests: No results for input(s): AST, ALT, ALKPHOS, BILITOT, PROT, ALBUMIN in the last 168 hours. No results for input(s): LIPASE, AMYLASE in the last 168 hours. Recent Labs  Lab  04/25/20 0415  AMMONIA 43*   Coagulation Profile: No results for input(s): INR, PROTIME in the last 168 hours. Cardiac Enzymes: No results for input(s): CKTOTAL, CKMB, CKMBINDEX, TROPONINI in the last 168 hours. BNP (last 3 results) No results for input(s): PROBNP in the last 8760 hours. HbA1C: No results for input(s): HGBA1C in the last 72 hours. CBG: Recent Labs  Lab 04/30/20 2007 05/01/20 0009 05/01/20 0354 05/01/20 0539 05/01/20 0739  GLUCAP 142* 138* 170* 186* 183*   Lipid Profile: No results for input(s): CHOL, HDL, LDLCALC, TRIG, CHOLHDL, LDLDIRECT in the last 72 hours. Thyroid Function Tests: No results for input(s): TSH, T4TOTAL, FREET4, T3FREE, THYROIDAB in the last 72 hours. Anemia Panel: No results for input(s): VITAMINB12, FOLATE, FERRITIN, TIBC, IRON, RETICCTPCT in the last 72 hours. Sepsis Labs: No results for input(s): PROCALCITON, LATICACIDVEN in the last 168 hours.  Recent Results (from  the past 240 hour(s))  Culture, Urine     Status: None   Collection Time: 04/26/20  2:38 PM   Specimen: Urine, Random  Result Value Ref Range Status   Specimen Description URINE, RANDOM  Final   Special Requests NONE  Final   Culture   Final    NO GROWTH Performed at [facility], [address], [place], [place] [address]    Report Status 04/27/2020 FINAL  Final         Radiology Studies: DG Abd 1 View  Result Date: 05/01/2020 CLINICAL DATA:  Abdominal pain. EXAM: ABDOMEN - 1 VIEW COMPARISON:  April 21, 2020. FINDINGS: The bowel gas pattern is normal. Distal tip of feeding tube is seen in expected position of distal stomach. No radio-opaque calculi or other significant radiographic abnormality are seen. IMPRESSION: No evidence of bowel obstruction or ileus. Electronically Signed   By: Marijo Conception M.D.   On: 05/01/2020 10:53   DG CHEST PORT 1 VIEW  Result Date: 05/01/2020 CLINICAL DATA:  Cough. EXAM: PORTABLE CHEST 1 VIEW COMPARISON:  April 26, 2020. FINDINGS: Improved opacities in the right lung. No visible pleural effusions or pneumothorax. Cardiac silhouette is within normal limits. Enteric tube courses below the diaphragm in outside the field of view. Right subclavian approach PICC with the tip projecting at the right atrium. No acute osseous abnormality. IMPRESSION: Improved opacities in right lung, suggesting improving pneumonia. Electronically Signed   By: Margaretha Sheffield MD   On: 05/01/2020 10:56        Scheduled Meds:  atorvastatin  20 mg Per Tube QPM   bisacodyl  10 mg Rectal Daily   chlorhexidine  15 mL Mouth Rinse BID   Chlorhexidine Gluconate Cloth  6 each Topical Daily   clonazePAM  1 mg Per Tube TID   diazepam  2 mg Per Tube QHS   docusate  100 mg Per Tube BID   enoxaparin (LOVENOX) injection  40 mg Subcutaneous Q24H   feeding supplement (PROSource TF)  45 mL Per Tube TID   insulin aspart  0-9 Units Subcutaneous Q4H   lacosamide  200 mg Per Tube BID   mouth rinse  15 mL Mouth Rinse q12n4p   metoprolol tartrate  12.5 mg Per Tube BID   multivitamin with minerals  1 tablet Per Tube Daily   pantoprazole sodium  40 mg Per Tube QHS   polyethylene glycol  17 g Per Tube Daily   QUEtiapine  150 mg Per Tube QHS   QUEtiapine  75 mg Per Tube Daily   Resource ThickenUp Clear   Oral Once   sodium chloride flush  10-40 mL Intracatheter Q12H   valproic acid  500 mg Per Tube BID   Continuous Infusions:  sodium chloride Stopped (04/23/20 2104)   feeding supplement (OSMOLITE 1.5 CAL) 50 mL/hr at 04/29/20 1902     LOS: 17 days    Time spent: 38 minutes spent on chart review, discussion with nursing staff, consultants, updating family and interview/physical exam; more than 50% of that time was spent in counseling and/or coordination of care.    Kollin Udell J [place] DO [facility] Available via Epic secure chat 7am-7pm After these hours, please refer to coverage provider listed on  [email or web address] 05/01/2020, 11:57 AM

## 2020-05-01 NOTE — Progress Notes (Addendum)
EKG completed at this time. Results sent to Dr. Tonie Griffith MD. Strips placed in patients chart.

## 2020-05-01 NOTE — Progress Notes (Signed)
Patient off unit to chest x-ray at this time.

## 2020-05-01 NOTE — Progress Notes (Signed)
Nutrition Follow-up  DOCUMENTATION CODES:   Not applicable  INTERVENTION:  Provide Ensure Enlive po TID, each supplement provides 350 kcal and 20 grams of protein.   Encourage adequate PO intake.   Continue Osmolite 1.5 formula via Cortrak NGT at goal rate of 50 ml/hr.   Continue 45 ml Prosource TF TID per tube.   Tube feeding regimenprovides 1920kcal, 108grams of protein, and 953m of free water daily.  NUTRITION DIAGNOSIS:   Inadequate oral intake related to lethargy/confusion as evidenced by NPO status; diet advance; progressing  GOAL:   Patient will meet greater than or equal to 90% of their needs; met with TF  MONITOR:   Diet advancement, Labs, Weight trends, TF tolerance, I & O's  REASON FOR ASSESSMENT:   Consult Enteral/tube feeding initiation and management  ASSESSMENT:   59year old female who presented to the ED on 11/12 with abdominal pain. PMH of intellectual disability, chronic constipation, epilepsy, intellectual disability, GERD, dysphagia, tremors. Pt admitted with sepsis secondary to UTI/pyelonephritis and left renal abscess. 11/15 - Cortrak placed, tip gastric  Diet advanced 11/26. Pt is currently on a dysphagia 1 diet with thin liquids. Pt full supervision at meals. Per RN, pt with poor po intake. Meal completion has been </= 25%. Family at bedside has been encouraging pt po intake. Per MD, plans to continue Cortrak NGT and TF orders as pt po has been poor. Possible plans to d/c Cortrak within the next 1-2 day or until po intake improves. RD to order Ensure to aid in PO intake.   Labs and medications reviewed.   Diet Order:   Diet Order            DIET - DYS 1 Room service appropriate? No; Fluid consistency: Thin  Diet effective now                 EDUCATION NEEDS:   No education needs have been identified at this time  Skin:  Skin Assessment: Skin Integrity Issues: Skin Integrity Issues:: Stage II Stage II: back  Last BM:   11/28  Height:   Ht Readings from Last 1 Encounters:  04/14/20 5' 6"  (1.676 m)    Weight:   Wt Readings from Last 1 Encounters:  04/29/20 56.9 kg    Ideal Body Weight:  59.1 kg  BMI:  Body mass index is 20.25 kg/m.  Estimated Nutritional Needs:   Kcal:  1800-2000  Protein:  90-110 gm  Fluid:  1.8-2.0 L  SCorrin Parker MS, RD, LDN RD pager number/after hours weekend pager number on Amion.

## 2020-05-01 NOTE — Progress Notes (Signed)
Suspected aspiration with medications. HR up to 135 and O2 down to 86%. O2 [place] placed at 2L to bring O2 up to 96%. HR remains ST. Dr. Tonie Griffith notified. Stat chest x-ray placed. Patient NPO for rest of the night, will repeat swallow eval tomorrow per MD.

## 2020-05-02 ENCOUNTER — Inpatient Hospital Stay (HOSPITAL_COMMUNITY): Payer: Medicare Other

## 2020-05-02 DIAGNOSIS — N12 Tubulo-interstitial nephritis, not specified as acute or chronic: Secondary | ICD-10-CM | POA: Diagnosis not present

## 2020-05-02 LAB — GLUCOSE, CAPILLARY
Glucose-Capillary: 135 mg/dL — ABNORMAL HIGH (ref 70–99)
Glucose-Capillary: 103 mg/dL — ABNORMAL HIGH (ref 70–99)
Glucose-Capillary: 106 mg/dL — ABNORMAL HIGH (ref 70–99)
Glucose-Capillary: 101 mg/dL — ABNORMAL HIGH (ref 70–99)

## 2020-05-02 LAB — CBC
HCT: 26.9 % — ABNORMAL LOW (ref 36.0–46.0)
Hemoglobin: 8.3 g/dL — ABNORMAL LOW (ref 12.0–15.0)
MCH: 31.1 pg (ref 26.0–34.0)
MCHC: 30.9 g/dL (ref 30.0–36.0)
MCV: 100.7 fL — ABNORMAL HIGH (ref 80.0–100.0)
Platelets: 444 10*3/uL — ABNORMAL HIGH (ref 150–400)
RBC: 2.67 MIL/uL — ABNORMAL LOW (ref 3.87–5.11)
RDW: 15.5 % (ref 11.5–15.5)
WBC: 15.5 10*3/uL — ABNORMAL HIGH (ref 4.0–10.5)
nRBC: 0.1 % (ref 0.0–0.2)

## 2020-05-02 LAB — BASIC METABOLIC PANEL
Anion gap: 13 (ref 5–15)
BUN: 36 mg/dL — ABNORMAL HIGH (ref 6–20)
CO2: 28 mmol/L (ref 22–32)
Calcium: 9.5 mg/dL (ref 8.9–10.3)
Chloride: 97 mmol/L — ABNORMAL LOW (ref 98–111)
Creatinine, Ser: 0.98 mg/dL (ref 0.44–1.00)
GFR, Estimated: >60 mL/min (ref >60)
Glucose, Bld: 159 mg/dL — ABNORMAL HIGH (ref 70–99)
Potassium: 4.3 mmol/L (ref 3.5–5.1)
Sodium: 138 mmol/L (ref 135–145)

## 2020-05-02 LAB — PHOSPHORUS: Phosphorus: 4.5 mg/dL (ref 2.5–4.6)

## 2020-05-02 LAB — MAGNESIUM: Magnesium: 2.3 mg/dL (ref 1.7–2.4)

## 2020-05-02 NOTE — Progress Notes (Signed)
Modified Barium Swallow Progress Note  Patient Details  Name: Maria [name] MRN: [id number] Date of Birth: November 25, 1960  Today's Date: 05/02/2020  Modified Barium Swallow completed.  Full report located under Chart Review in the Imaging Section.  Brief recommendations include the following:  Clinical Impression  Pt presents with quite functional swallow.  She demonstrated some initial reluctance to eat the solid/pureed barium, which caused hesitation while eating, requiring prompts to chew and swallow (this creates what appears to be an oral delay on the imaging).  She demonstrated reliable laryngeal vestibule closure across all trials, excluding one episode of penetration of thin liquids (considered WFL). There was no aspiration, despite large, successive boluses of thin and mixed solid/thin liquid consistencies.  There was trace vallecular residue, insignificant.  Recommend advancing diet to a dysphagia 3, thin liquids and D/Cing the cortrak.  Pt will need encouragement and assistance to feed herself - recommend removing the mitts as much as able (which should be more feasible with cortrak removed) to encourage independence.  SLP will follow briefly to ensure safety.  Sent a message via Secure Chat to Dr. [place] and spoke with pt's RN.    Swallow Evaluation Recommendations       SLP Diet Recommendations: Dysphagia 3 (Mech soft) solids;Thin liquid   Liquid Administration via: Cup;Straw   Medication Administration: Whole meds with puree   Supervision: Staff to assist with self feeding;Patient able to self feed   Compensations: Minimize environmental distractions       Oral Care Recommendations: Oral care BID        [name] 05/02/2020,2:58 PM

## 2020-05-02 NOTE — Progress Notes (Signed)
Physical Therapy Treatment Patient Details Name: Maria [name] MRN: [id number] DOB: [birth year] Today's Date: 05/02/2020    History of Present Illness 59 yo admitted with sepsis, pyelonephritis and encephalopathy. 11/18 Sz with continuous EEG. PMhx: epilepsy, intellectual disability, GERD    PT Comments    Goal update completed.  Pt is progressing with every therapy session.  Sister present and participating in her care throughout today's session.  Pt was able to stand and pivot to [facility] with two person mod assist.  She did much better with the use of RW getting as good as min assist for her stand and one person mod assist for transfer with AD. VSS throughout on RA with O2 in the low 90s and HR 100s-120s.  I am hopeful to start to progress to short distance gait with RW and chair to follow very closely next session.  I have also asked for CIR consult to see if she is a candidate for more intensive therapy there.  PT will continue to follow acutely for safe mobility progression.   Follow Up Recommendations  CIR     Equipment Recommendations  Rolling walker with 5" wheels    Recommendations for Other Services Rehab consult     Precautions / Restrictions Precautions Precautions: Fall Precaution Comments: cortrak    Mobility  Bed Mobility Overal bed mobility: Needs Assistance Bed Mobility: Supine to Sit;Sit to Supine     Supine to sit: Mod assist;HOB elevated Sit to supine: Min assist   General bed mobility comments: Pt needed mod assist to support trunk and progress bil LEs to EOB, pt kept putting them back into bed.  Min assist at trunk to return to supine, pt able to lift both of her legs back.   Transfers Overall transfer level: Needs assistance Equipment used: 2 person hand held assist;Rolling walker (2 wheeled) Transfers: Sit to/from [facility] Sit to Stand: Mod assist;+2 physical assistance;Min assist Stand pivot transfers: +2 physical assistance;Mod  assist       General transfer comment: Pt stood EOB with two person hand held assist x2, then transferred to [facility] after reporting the need to have a BM and urinate.  Mod assist for transfer to [facility] mostly to support trunk over weak legs with posterior preference.  For peri care, we switched to using the RW and one person mod first attempt and min second attempt stand from [facility].  Total assist for peri care and still mod assist of one to transfer back to bed with RW, uncontrolled premature sit to bed.    Ambulation/Gait             General Gait Details: I think pt could be ready for short distance gait with RW and chair to follow very closely.    Stairs             Wheelchair Mobility    Modified Rankin (Stroke Patients Only)       Balance Overall balance assessment: Needs assistance Sitting-balance support: Feet supported;Bilateral upper extremity supported;No upper extremity supported Sitting balance-Leahy Scale: Poor Sitting balance - Comments: need min to mod assist EOB due to posterior lean/LOB when therapist releases support.   Postural control: Posterior lean Standing balance support: Bilateral upper extremity supported Standing balance-Leahy Scale: Poor Standing balance comment: two person mod assist with no device one person min to mod assist with RW.  Cognition Arousal/Alertness: Awake/alert Behavior During Therapy: Flat affect;Impulsive Overall Cognitive Status: Impaired/Different from baseline Area of Impairment: Attention;Following commands;Safety/judgement;Awareness;Problem solving                 Orientation Level:  (knows her sister) Current Attention Level: Sustained   Following Commands: Follows one step commands with increased time;Follows one step commands inconsistently Safety/Judgement: Decreased awareness of safety;Decreased awareness of deficits Awareness: Intellectual Problem Solving: Slow  processing;Difficulty sequencing;Requires verbal cues;Requires tactile cues;Decreased initiation General Comments: Pt following basic commands at times with delay, needs multimodal cues to initiate movement to EOB.  Sustained attention in busy room, but often distracted by sister and RN who were also talking/in room working.  Verbalized need to use the bathroom appropriately once EOB.        Exercises      General Comments General comments (skin integrity, edema, etc.): transport waiting to take pt to modified barium swallow test, so OOB to recliner chair deferred for now as well as UE/LE exercises.  Pt is ready for an increased intensity of therapy. O2 sats stayed in the low 90s on RA throughout session and HR 100s-120s during mobility.       Pertinent Vitals/Pain Pain Assessment: Faces Faces Pain Scale: Hurts little more Pain Location: generalized with mobility, unable to localize Pain Descriptors / Indicators: Grimacing;Guarding Pain Intervention(s): Limited activity within patient's tolerance;Monitored during session;Repositioned    Home Living                      Prior Function            PT Goals (current goals can now be found in the care plan section) Acute Rehab PT Goals Patient Stated Goal: pt did not state, sister wants her to get stronger so she can take her to the movies.  She likes Clifford the big red dog. PT Goal Formulation: With patient/family Time For Goal Achievement: 05/16/20 Potential to Achieve Goals: Good Progress towards PT goals: Progressing toward goals    Frequency    Min 3X/week      PT Plan Discharge plan needs to be updated    Co-evaluation              AM-PAC PT "6 Clicks" Mobility   Outcome Measure  Help needed turning from your back to your side while in a flat bed without using bedrails?: A Little Help needed moving from lying on your back to sitting on the side of a flat bed without using bedrails?: A Lot Help needed  moving to and from a bed to a chair (including a wheelchair)?: A Lot Help needed standing up from a chair using your arms (e.g., wheelchair or bedside chair)?: A Lot Help needed to walk in hospital room?: A Lot Help needed climbing 3-5 steps with a railing? : Total 6 Click Score: 12    End of Session   Activity Tolerance: Patient limited by fatigue Patient left: in bed;with call bell/phone within reach;with bed alarm set;with family/visitor present;Other (comment) (transporter coming in to take her to [facility] exam) Nurse Communication: Mobility status;Other (comment) (O2 sats on RA) PT Visit Diagnosis: Other abnormalities of gait and mobility (R26.89);Muscle weakness (generalized) (M62.81);Difficulty in walking, not elsewhere classified (R26.2)     Time: 7517-0017 PT Time Calculation (min) (ACUTE ONLY): 22 min  Charges:  $Therapeutic Activity: 8-22 mins                     Verdene Lennert,  PT, DPT  Acute Rehabilitation #[phone] pager [phone]) [phone] office

## 2020-05-02 NOTE — Progress Notes (Addendum)
PROGRESS NOTE    Maria [name]  AQT:[id number] DOB: 1960-10-09 DOA: 04/14/2020 PCP: Celene Squibb, MD    Brief Narrative:  Maria [name] is a 59 year old female with past medical history significant for epilepsy, intellectual delay, GERD, dysphagia/esophageal stricture, chronic diastolic congestive heart failure who was admitted on 04/14/2020 and transferred from [facility] to [facility], ICU on 04/16/2020 with septic shock secondary to E. coli UTI/left pyelonephritis complicated by breakthrough seizures.  Patient has been weaned from vasopressors and completed antibiotic course for E. coli/pyelonephritis septic shock.  Continues with waxing and waning mentation and dysphagia, currently requiring tube feeds for nutrition.  Transferred from PCCM service to hospitalist service on 04/29/2020.   Assessment & Plan:   Principal Problem:   Pyelonephritis of left kidney Active Problems:   Seizures (HCC)   GERD (gastroesophageal reflux disease)   Intellectual disability   Generalized convulsive epilepsy ([place])   DNR (do not resuscitate) discussion   Seizure disorder ([place])   Frequent falls   Renal abscess   Leukocytosis   Fever   LLQ abdominal pain   Dysphagia   AKI (acute kidney injury) ([place])   Sepsis secondary to UTI [facility])   Palliative care by specialist   Pressure injury of skin   Septic shock E. coli UTI Left pyelonephritis Patient initially presenting to the hospital with left lower quadrant abdominal pain and found to have a urinary tract infection with sepsis and was admitted for IV antibiotic treatment.  During her initial course, her blood pressure continued to decline and ultimately required transfer to [facility], ICU under the pulmonary critical care service for need of vasopressors.  CT abdomen/pelvis with findings suspicious for left-sided pyelonephritis and small abscess lower pole of left kidney.  Urine culture positive for E. coli.  Patient initially started  on azithromycin and Zosyn and completed course of ceftriaxone.  Patient was eventually titrated off of vasopressors and repeat urinalysis on 04/26/2020 with no growth. --Continue supportive care --Continue to monitor fever curve, CBC daily  Epilepsy with breakthrough seizures During initial hospitalization, patient developed seizure activity.  Etiology likely provoked from acute infectious process as above.  Patient was seen by neurology and started on her home antiepileptics.  She was monitored on EEG with no further seizure activity appreciated and generalized spike and wave discharges were likely consistent with patient's known history of generalized epilepsy.  Neurology signed off on 04/23/2020 with no further inpatient work-up indicated with recommendations of follow-up with outpatient neurology. --valproic acid 500 mg BID per tube --Vimpat 200mg  BID per tube --Seizure precautions --Ativan IV as needed breakthrough seizure  Dysphagia Hx esophageal stricture Patient follows with gastroenterology outpatient, Dr. Laural Golden.  Has required several esophageal dilations for strictures in the past. --SLP following, appreciate assistance --Patient did well with barium swallow on 05/02/2020 --Discontinue core track/tube feeds today --Dysphagia 3 diet with thin liquids --Continue aspiration precautions --Closely monitor percentage of oral intake  Concern for left kidney abscess versus cyst Initial CT abdomen/pelvis notable for left kidney lower pole abscess measuring 2.5 x 2.4 cm.  Follow-up renal ultrasound 04/25/2020 shows stable hypoechoic lesion lower pole left kidney consistent with small perinephric abscess versus renal cyst. --Completed course of IV antibiotics as above --Continue to monitor fever curve --Repeat CT abdomen/pelvis with contrast 1 month  Moderate protein calorie malnutrition Body mass index is 22.6 kg/m. Nutrition Status: Nutrition Problem: Inadequate oral  intake Etiology: lethargy/confusion Signs/Symptoms: NPO status Interventions: Tube feeding --SLP/nutrition continues to follow, barium swallow  05/02/2020 without signs of aspiration --Discontinue core track and tube feeds today  Chronic diastolic congestive heart failure TTE with LVEF 60 to 08%, grade 2 diastolic dysfunction, circumferential pericardial effusion, IVC normal in size, mild MR, no aortic stenosis. --Metoprolol 12.5 mg p.o. twice daily  Intellectual delay --Get up during the day to bedside chair --Encourage a familiar face to remain present throughout the day --Keep blinds open and lights on during daylight hours --Minimize the use of opioids --Seroquel 75 mg daily, 150 mg nightly --Diazepam 2 mg qHS --Clonazepam 1 mg TID  HLD: Continue atorvastatin 20 mg daily  GERD: Protonix 40 mg daily  Weakness/debility/deconditioning: --Continue PT/OT efforts while inpatient --Currently recommending home with 24-hour assistance vs SNF vs CIR   DVT prophylaxis: Lovenox Code Status: Full code Family Communication: no family present at bedside this am, updated patient's mother via telephone this morning  Disposition Plan:  Status is: Inpatient  Remains inpatient appropriate because:Altered mental status, Ongoing diagnostic testing needed not appropriate for outpatient work up, Unsafe d/c plan, IV treatments appropriate due to intensity of illness or inability to take PO and Inpatient level of care appropriate due to severity of illness   Dispo: The patient is from: Home              Anticipated d/c is to: To be determined, home with home health versus SNF              Anticipated d/c date is: > 3 days              Patient currently is not medically stable to d/c.   Consultants:   Neurology, signed off 04/23/2020  PCCM, signed off 04/29/2020  Procedures:   Core track placement  RUE PICC 11/15  EEG:  Generalized seizure; BIRDs; spike and wave, generalized;  continuous slow, generalized.  CT abdomen/pelvis: Findings suspicious for pyelonephritis and small abscess in lower pole of left kidney. Suggest correlation with urinalysis, and recommend continued follow-up by CT to confirm resolution. No evidence of ureteral calculi or hydronephrosis.  US Renal 11/23:  Stable hypoechoic lesion within the lower pole of the left kidney. While this may represent a small perinephric abscess, as suggested on the prior study, and infected renal cyst cannot be excluded.  Antimicrobials:   Azithromycin 11/14 - 11/15  Zosyn 11/14 -11/15  Ceftriaxone 11/12 - 11/25  Fluconazole 11/21 -11/21    Subjective: Patient seen and examined at bedside, resting comfortably.  No current complaints this morning, no family present at bedside.  Nursing updated regarding potential aspiration events overnight with significant coughing while patient was drinking thin fluids from a cup with a straw.  Nursing believes this was secondary to straw use.  Chest x-ray unrevealing. Patient denies headache, no chest pain, no shortness of breath.  No other concerns overnight per nursing staff.  Addendum 1448: Received message from speech therapy, no aspiration on barium swallow.  Will discontinue core track/tube feeds and monitor oral intake.  Objective: Vitals:   05/02/20 0312 05/02/20 0400 05/02/20 0642 05/02/20 0739  BP: 104/62  101/76 109/66  Pulse: 100  (!) 104 (!) 108  Resp: 20  20 20   Temp: 98.2 F (36.8 C)  98.9 F (37.2 C) 98.6 F (37 C)  TempSrc: Axillary  Oral Oral  SpO2: 95%  96% 95%  Weight:  63.5 kg    Height:        Intake/Output Summary (Last 24 hours) at 05/02/2020 1133 Last data filed at 05/02/2020 0600  Gross per 24 hour  Intake 1590 ml  Output 650 ml  Net 940 ml   Filed Weights   04/28/20 2200 04/29/20 0313 05/02/20 0400  Weight: 56.9 kg 56.9 kg 63.5 kg    Examination:  General exam: Appears calm and comfortable  Respiratory system: Clear to  auscultation. Respiratory effort normal.  Oxygenating well on room air Cardiovascular system: S1 & S2 heard, tachycardic, regular rhythm. No JVD, murmurs, rubs, gallops or clicks. No pedal edema. Gastrointestinal system: Abdomen is nondistended, soft and nontender. No organomegaly or masses felt. Normal bowel sounds heard.  Core track tube noted in place Central nervous system: Alert, oriented to place hospital; and person but not time; per nursing was more oriented earlier this morning. No focal neurological deficits. Extremities: Symmetric 5 x 5 power. Skin: Small pressure lesion right upper back, otherwise no rashes, lesions or ulcers Psychiatry:  Mood & affect appropriate.     Data Reviewed: I have personally reviewed following labs and imaging studies  CBC: Recent Labs  Lab 04/28/20 0500 04/29/20 0329 04/30/20 0325 05/01/20 0400 05/02/20 0301  WBC 14.7* 14.3* 13.6* 13.3* 15.5*  HGB 9.4* 8.2* 8.7* 8.4* 8.3*  HCT 29.9* 27.5* 28.7* 27.8* 26.9*  MCV 100.0 101.9* 102.9* 101.5* 100.7*  PLT 419* 383 433* 394 272*   Basic Metabolic Panel: Recent Labs  Lab 04/28/20 0500 04/29/20 0329 04/30/20 0325 05/01/20 0400 05/02/20 0301  NA 140 143 143 141 138  K 4.4 4.5 4.5 4.0 4.3  CL 100 102 102 100 97*  CO2 26 26 28 27 28   GLUCOSE 202* 201* 207* 180* 159*  BUN 38* 34* 35* 34* 36*  CREATININE 0.94 0.82 0.92 0.80 0.98  CALCIUM 9.8 9.3 9.8 9.6 9.5  MG  --  2.0 2.3 2.3 2.3  PHOS  --  4.0 4.6 4.7* 4.5   GFR: Estimated Creatinine Clearance: 58.6 mL/min (by C-G formula based on SCr of 0.98 mg/dL). Liver Function Tests: No results for input(s): AST, ALT, ALKPHOS, BILITOT, PROT, ALBUMIN in the last 168 hours. No results for input(s): LIPASE, AMYLASE in the last 168 hours. No results for input(s): AMMONIA in the last 168 hours. Coagulation Profile: No results for input(s): INR, PROTIME in the last 168 hours. Cardiac Enzymes: No results for input(s): CKTOTAL, CKMB, CKMBINDEX, TROPONINI  in the last 168 hours. BNP (last 3 results) No results for input(s): PROBNP in the last 8760 hours. HbA1C: No results for input(s): HGBA1C in the last 72 hours. CBG: Recent Labs  Lab 05/01/20 0739 05/01/20 1202 05/01/20 1630 05/01/20 1947 05/02/20 0814  GLUCAP 183* 158* 204* 159* 135*   Lipid Profile: No results for input(s): CHOL, HDL, LDLCALC, TRIG, CHOLHDL, LDLDIRECT in the last 72 hours. Thyroid Function Tests: No results for input(s): TSH, T4TOTAL, FREET4, T3FREE, THYROIDAB in the last 72 hours. Anemia Panel: No results for input(s): VITAMINB12, FOLATE, FERRITIN, TIBC, IRON, RETICCTPCT in the last 72 hours. Sepsis Labs: No results for input(s): PROCALCITON, LATICACIDVEN in the last 168 hours.  Recent Results (from the past 240 hour(s))  Culture, Urine     Status: None   Collection Time: 04/26/20  2:38 PM   Specimen: Urine, Random  Result Value Ref Range Status   Specimen Description URINE, RANDOM  Final   Special Requests NONE  Final   Culture   Final    NO GROWTH Performed at [facility], [address] [place],  [address]    Report Status 04/27/2020 FINAL  Final  Radiology Studies: DG Chest 2 View  Result Date: 05/02/2020 CLINICAL DATA:  Cough EXAM: CHEST - 2 VIEW COMPARISON:  Film from the previous day. FINDINGS: Cardiac shadow is within normal limits. Feeding catheter is noted extending into the stomach. Right-sided PICC line is noted in satisfactory position. The lungs are well aerated without focal infiltrate or effusion. No bony abnormality is seen. IMPRESSION: No acute abnormality noted. Electronically Signed   By: Inez Catalina M.D.   On: 05/02/2020 00:15   DG Abd 1 View  Result Date: 05/01/2020 CLINICAL DATA:  Abdominal pain. EXAM: ABDOMEN - 1 VIEW COMPARISON:  April 21, 2020. FINDINGS: The bowel gas pattern is normal. Distal tip of feeding tube is seen in expected position of distal stomach. No radio-opaque calculi or other  significant radiographic abnormality are seen. IMPRESSION: No evidence of bowel obstruction or ileus. Electronically Signed   By: Marijo Conception M.D.   On: 05/01/2020 10:53   DG CHEST PORT 1 VIEW  Result Date: 05/01/2020 CLINICAL DATA:  Cough. EXAM: PORTABLE CHEST 1 VIEW COMPARISON:  April 26, 2020. FINDINGS: Improved opacities in the right lung. No visible pleural effusions or pneumothorax. Cardiac silhouette is within normal limits. Enteric tube courses below the diaphragm in outside the field of view. Right subclavian approach PICC with the tip projecting at the right atrium. No acute osseous abnormality. IMPRESSION: Improved opacities in right lung, suggesting improving pneumonia. Electronically Signed   By: Margaretha Sheffield MD   On: 05/01/2020 10:56        Scheduled Meds: . atorvastatin  20 mg Per Tube QPM  . bisacodyl  10 mg Rectal Daily  . chlorhexidine  15 mL Mouth Rinse BID  . Chlorhexidine Gluconate Cloth  6 each Topical Daily  . clonazePAM  1 mg Per Tube TID  . diazepam  2 mg Per Tube QHS  . docusate  100 mg Per Tube BID  . enoxaparin (LOVENOX) injection  40 mg Subcutaneous Q24H  . feeding supplement  237 mL Oral TID BM  . feeding supplement (PROSource TF)  45 mL Per Tube TID  . insulin aspart  0-9 Units Subcutaneous Q4H  . lacosamide  200 mg Per Tube BID  . mouth rinse  15 mL Mouth Rinse q12n4p  . metoprolol tartrate  25 mg Per Tube BID  . multivitamin with minerals  1 tablet Per Tube Daily  . pantoprazole sodium  40 mg Per Tube QHS  . polyethylene glycol  17 g Per Tube Daily  . QUEtiapine  150 mg Per Tube QHS  . QUEtiapine  75 mg Per Tube Daily  . Resource ThickenUp Clear   Oral Once  . sodium chloride flush  10-40 mL Intracatheter Q12H  . valproic acid  500 mg Per Tube BID   Continuous Infusions: . sodium chloride Stopped (04/23/20 2104)  . feeding supplement (OSMOLITE 1.5 CAL) 50 mL/hr at 04/29/20 1902     LOS: 18 days    Time spent: 38 minutes spent on  chart review, discussion with nursing staff, consultants, updating family and interview/physical exam; more than 50% of that time was spent in counseling and/or coordination of care.    Zahari Xiang J [place] DO [facility] Available via Epic secure chat 7am-7pm After these hours, please refer to coverage provider listed on [email or web address] 05/02/2020, 11:33 AM

## 2020-05-02 NOTE — Progress Notes (Signed)
Patient resting in bed near end of shift. Patient A&O x1. Patient confused and pulls at wires when awake Patient continues with feeding at 50 cc/hr. Stat chest x-ray and EKG performed during shift after patient appeared to have aspirated on liquid medication. Patients O2 dropped to 86% and was placed on O2 @ 2L. Patient currently continues with O2 @ 2L. Patient HR also elevated to ST. When patient sleeps HR goes down to SR when awake goes back up to ST. X-ray came back clear. Patient continues with NPO per MD until re evaluation of a swallow test can be determined. Patient given Robitussin via NG for persistent cough. U/o during shift 650 ml, no BM noted. Patient denies any pain at this time. Safety measures remain in place. Patient remains with Electronics engineer. Will continue to monitor and SBARR to day shift nurse at change of shift.

## 2020-05-02 NOTE — Progress Notes (Signed)
Updated brother on pt status via phone. Questions and concerns addressed.

## 2020-05-02 NOTE — Progress Notes (Signed)
Inpatient Rehab Admissions Coordinator Note:   Per therapy recommendations, pt was screened for CIR candidacy by Ed Rayson, MS CCC-SLP. At this time, Pt. Appears to have functional decline and is a good candidate for CIR. Will place order for rehab consult per protocol.  Please contact me with questions.   Verbie Babic, MS, CCC-SLP Rehab Admissions Coordinator  [phone] (celll) [phone] (office)  

## 2020-05-02 NOTE — Progress Notes (Signed)
Patient back on unit at this time. Remains on O2 at 2L and telemetry in place.

## 2020-05-02 NOTE — Progress Notes (Signed)
Speech Pathology:  Given concern for aspiration event last night, recommend proceeding with MBS today.  She is scheduled for 1:00 pm.  Estill Bamberg L. Tivis Ringer, [place] Office number [phone] Pager [phone]

## 2020-05-03 DIAGNOSIS — N12 Tubulo-interstitial nephritis, not specified as acute or chronic: Secondary | ICD-10-CM | POA: Diagnosis not present

## 2020-05-03 DIAGNOSIS — J9601 Acute respiratory failure with hypoxia: Secondary | ICD-10-CM | POA: Diagnosis not present

## 2020-05-03 LAB — CBC
HCT: 26.9 % — ABNORMAL LOW (ref 36.0–46.0)
Hemoglobin: 8.5 g/dL — ABNORMAL LOW (ref 12.0–15.0)
MCH: 31.3 pg (ref 26.0–34.0)
MCHC: 31.6 g/dL (ref 30.0–36.0)
MCV: 98.9 fL (ref 80.0–100.0)
Platelets: 473 10*3/uL — ABNORMAL HIGH (ref 150–400)
RBC: 2.72 MIL/uL — ABNORMAL LOW (ref 3.87–5.11)
RDW: 15.4 % (ref 11.5–15.5)
WBC: 17.2 10*3/uL — ABNORMAL HIGH (ref 4.0–10.5)
nRBC: 0 % (ref 0.0–0.2)

## 2020-05-03 LAB — BASIC METABOLIC PANEL
Anion gap: 17 — ABNORMAL HIGH (ref 5–15)
BUN: 33 mg/dL — ABNORMAL HIGH (ref 6–20)
CO2: 24 mmol/L (ref 22–32)
Calcium: 9.4 mg/dL (ref 8.9–10.3)
Chloride: 96 mmol/L — ABNORMAL LOW (ref 98–111)
Creatinine, Ser: 0.92 mg/dL (ref 0.44–1.00)
GFR, Estimated: >60 mL/min (ref >60)
Glucose, Bld: 108 mg/dL — ABNORMAL HIGH (ref 70–99)
Potassium: 4.2 mmol/L (ref 3.5–5.1)
Sodium: 137 mmol/L (ref 135–145)

## 2020-05-03 LAB — GLUCOSE, CAPILLARY
Glucose-Capillary: 104 mg/dL — ABNORMAL HIGH (ref 70–99)
Glucose-Capillary: 112 mg/dL — ABNORMAL HIGH (ref 70–99)
Glucose-Capillary: 116 mg/dL — ABNORMAL HIGH (ref 70–99)
Glucose-Capillary: 137 mg/dL — ABNORMAL HIGH (ref 70–99)
Glucose-Capillary: 148 mg/dL — ABNORMAL HIGH (ref 70–99)
Glucose-Capillary: 150 mg/dL — ABNORMAL HIGH (ref 70–99)
Glucose-Capillary: 94 mg/dL (ref 70–99)
Glucose-Capillary: 98 mg/dL (ref 70–99)

## 2020-05-03 LAB — MAGNESIUM: Magnesium: 2.2 mg/dL (ref 1.7–2.4)

## 2020-05-03 LAB — PHOSPHORUS: Phosphorus: 4.6 mg/dL (ref 2.5–4.6)

## 2020-05-03 MED ORDER — ADULT MULTIVITAMIN W/MINERALS CH
1.0000 | ORAL_TABLET | Freq: Every day | ORAL | Status: DC
  Administered 2020-05-03 – 2020-05-05 (×3): 1 via ORAL
  Filled 2020-05-03 (×3): qty 1

## 2020-05-03 MED ORDER — PANTOPRAZOLE SODIUM 40 MG PO TBEC
40.0000 mg | DELAYED_RELEASE_TABLET | Freq: Every day | ORAL | Status: DC
  Administered 2020-05-03 – 2020-05-05 (×3): 40 mg via ORAL
  Filled 2020-05-03 (×3): qty 1

## 2020-05-03 MED ORDER — GUAIFENESIN-DM 100-10 MG/5ML PO SYRP
10.0000 mL | ORAL_SOLUTION | ORAL | Status: DC | PRN
  Administered 2020-05-03: 10 mL via ORAL
  Filled 2020-05-03: qty 10

## 2020-05-03 MED ORDER — QUETIAPINE FUMARATE 50 MG PO TABS
75.0000 mg | ORAL_TABLET | Freq: Every day | ORAL | Status: DC
  Administered 2020-05-03 – 2020-05-05 (×3): 75 mg via ORAL
  Filled 2020-05-03 (×3): qty 1

## 2020-05-03 MED ORDER — QUETIAPINE FUMARATE 50 MG PO TABS
150.0000 mg | ORAL_TABLET | Freq: Every day | ORAL | Status: DC
  Administered 2020-05-03 – 2020-05-04 (×2): 150 mg via ORAL
  Filled 2020-05-03 (×2): qty 1

## 2020-05-03 MED ORDER — ATORVASTATIN CALCIUM 10 MG PO TABS
20.0000 mg | ORAL_TABLET | Freq: Every evening | ORAL | Status: DC
  Administered 2020-05-03 – 2020-05-04 (×2): 20 mg via ORAL
  Filled 2020-05-03 (×3): qty 2

## 2020-05-03 MED ORDER — LACOSAMIDE 50 MG PO TABS
200.0000 mg | ORAL_TABLET | Freq: Two times a day (BID) | ORAL | Status: DC
  Administered 2020-05-03 – 2020-05-05 (×5): 200 mg via ORAL
  Filled 2020-05-03 (×5): qty 4

## 2020-05-03 MED ORDER — CLONAZEPAM 1 MG PO TABS
1.0000 mg | ORAL_TABLET | Freq: Three times a day (TID) | ORAL | Status: DC
  Administered 2020-05-03 – 2020-05-05 (×8): 1 mg via ORAL
  Filled 2020-05-03 (×8): qty 1

## 2020-05-03 MED ORDER — METOPROLOL TARTRATE 25 MG PO TABS
25.0000 mg | ORAL_TABLET | Freq: Two times a day (BID) | ORAL | Status: DC
  Administered 2020-05-03 – 2020-05-05 (×5): 25 mg via ORAL
  Filled 2020-05-03 (×5): qty 1

## 2020-05-03 MED ORDER — DIAZEPAM 2 MG PO TABS
2.0000 mg | ORAL_TABLET | Freq: Every day | ORAL | Status: DC
  Administered 2020-05-03 – 2020-05-04 (×2): 2 mg via ORAL
  Filled 2020-05-03 (×2): qty 1

## 2020-05-03 MED ORDER — DOCUSATE SODIUM 100 MG PO CAPS
100.0000 mg | ORAL_CAPSULE | Freq: Two times a day (BID) | ORAL | Status: DC
  Administered 2020-05-03 – 2020-05-05 (×5): 100 mg via ORAL
  Filled 2020-05-03 (×5): qty 1

## 2020-05-03 MED ORDER — ONDANSETRON HCL 4 MG/2ML IJ SOLN: 4.0000 mg | Freq: Four times a day (QID) | INTRAMUSCULAR | Status: DC | PRN

## 2020-05-03 MED ORDER — ONDANSETRON HCL 4 MG PO TABS: 4.0000 mg | ORAL_TABLET | Freq: Four times a day (QID) | ORAL | Status: DC | PRN

## 2020-05-03 MED ORDER — ACETAMINOPHEN 500 MG PO TABS
500.0000 mg | ORAL_TABLET | Freq: Four times a day (QID) | ORAL | Status: DC | PRN
  Administered 2020-05-03: 500 mg via ORAL
  Filled 2020-05-03: qty 1

## 2020-05-03 NOTE — TOC Progression Note (Signed)
Transition of Care [facility]) - Progression Note    Patient Details  Name: MERIA CRILLY MRN: [id number] Date of Birth: 1960-07-19  Transition of Care [facility]) CM/SW [place], LCSW Phone Number: 05/03/2020, 11:09 AM  Clinical Narrative:    CSW received consult for possible SNF placement at time of discharge. CSW spoke with patient's mother at bedside regarding PT recommendation of SNF placement at time of discharge. Patient's mother reported that patient lives with her and she is the patient's caregiver along with her other daughter. She expressed understanding of PT recommendation and is agreeable to SNF placement at time of discharge if needed. Patient reports preference for a facility in [place] but she is aware of some facilities that she reports are not good. CSW discussed insurance authorization process and provided Medicare SNF ratings list. Patient has received the COVID vaccines. CSW also spoke with patient's sister [name] CSW to continue to follow and assist with discharge planning needs.    Expected Discharge Plan: IP Rehab Facility Barriers to Discharge: Continued Medical Work up  Expected Discharge Plan and Services Expected Discharge Plan: [place] In-house Referral: Clinical Social Work Discharge Planning Services: CM Consult Post Acute Care Choice: [place], [place] arrangements for the past 2 months: Single Family Home                                       Social Determinants of Health (SDOH) Interventions    Readmission Risk Interventions No flowsheet data found.

## 2020-05-03 NOTE — Progress Notes (Addendum)
Inpatient Rehabilitation Admissions Coordinator  Inpatient rehab consult received. I met with patient at bedside. No family present. I left voicemail for her Mom to contact me to discuss rehab venue options. I did reach her sister, [name], by phone and discussed Cir vs SNF rehab options. [name] will discuss with her Mom and when Mom arrives to visit today, I will return to discuss Cir goals and expectations to complete assessment of candidacy for Cir admit.  Danne Baxter, RN, MSN Rehab Admissions Coordinator [phone] 05/03/2020 12:41 PM   I met with patient and her Mom at bedside. Mom states preference for CIR admit rather than [facility] if possible. I will discuss case with Rehab MD and follow up tomorrow to let acute team know if patient felt to be a candidate and if bed will be available.   Danne Baxter, RN, MSN Rehab Admissions Coordinator [phone] 05/03/2020 3:43 PM

## 2020-05-03 NOTE — Progress Notes (Signed)
PROGRESS NOTE    Maria [name]  HXT:[id number] DOB: [birth year] DOA: 04/14/2020 PCP: Celene Squibb, MD    Brief Narrative:  Maria [name] is a 59 year old female with past medical history significant for epilepsy, intellectual delay, GERD, dysphagia/esophageal stricture, chronic diastolic congestive heart failure who was admitted on 04/14/2020 and transferred from [facility] to [facility], ICU on 04/16/2020 with septic shock secondary to E. coli UTI/left pyelonephritis complicated by breakthrough seizures.  Patient has been weaned from vasopressors and completed antibiotic course for E. coli/pyelonephritis septic shock.  Continues with waxing and waning mentation and dysphagia, currently requiring tube feeds for nutrition.  Transferred from PCCM service to hospitalist service on 04/29/2020.  Subjective:  Acute events overnight as discussed with staff, patient herself denies any complaints.  Assessment & Plan:   Principal Problem:   Pyelonephritis of left kidney Active Problems:   Seizures (HCC)   GERD (gastroesophageal reflux disease)   Intellectual disability   Generalized convulsive epilepsy ([place])   DNR (do not resuscitate) discussion   Seizure disorder ([place])   Frequent falls   Renal abscess   Leukocytosis   Fever   LLQ abdominal pain   Dysphagia   AKI (acute kidney injury) ([place])   Sepsis secondary to UTI [facility])   Palliative care by specialist   Pressure injury of skin   Septic shock due E. coli UTI and Left pyelonephritis - Patient initially presenting to the hospital with left lower quadrant abdominal pain and found to have a urinary tract infection with sepsis and was admitted for IV antibiotic treatment.  During her initial course, her blood pressure continued to decline and ultimately required transfer to [facility], ICU under the pulmonary critical care service for need of vasopressors.  CT abdomen/pelvis with findings suspicious for left-sided  pyelonephritis and small abscess lower pole of left kidney.  Urine culture positive for E. coli.  Patient initially started on azithromycin and Zosyn and completed course of ceftriaxone.  Patient was eventually titrated off of vasopressors and repeat urinalysis on 04/26/2020 with no growth. --Continue supportive care --Continue to monitor fever curve, CBC daily  Epilepsy with breakthrough seizures During initial hospitalization, patient developed seizure activity.  Etiology likely provoked from acute infectious process as above.  Patient was seen by neurology and started on her home antiepileptics.  She was monitored on EEG with no further seizure activity appreciated and generalized spike and wave discharges were likely consistent with patient's known history of generalized epilepsy.  Neurology signed off on 04/23/2020 with no further inpatient work-up indicated with recommendations of follow-up with outpatient neurology. --valproic acid 500 mg BID per tube --Vimpat 200mg  BID per tube --Seizure precautions --Ativan IV as needed breakthrough seizure  Dysphagia Hx esophageal stricture Patient follows with gastroenterology outpatient, Dr. Laural Golden.  Has required several esophageal dilations for strictures in the past. --SLP following, appreciate assistance on dysphagia 3 thin liquid. --Patient did well with barium swallow on 05/02/2020  Concern for left kidney abscess versus cyst Initial CT abdomen/pelvis notable for left kidney lower pole abscess measuring 2.5 x 2.4 cm.  Follow-up renal ultrasound 04/25/2020 shows stable hypoechoic lesion lower pole left kidney consistent with small perinephric abscess versus renal cyst.  Have discussed with urology Dr. Junious Silk, apparently this is an old cyst present even on previous CT scan in 2014 with no changes..  Moderate protein calorie malnutrition Body mass index is 22.38 kg/m. Nutrition Status: Nutrition Problem: Inadequate oral intake Etiology:  lethargy/confusion Signs/Symptoms: NPO status Interventions: Tube  feeding --SLP/nutrition continues to follow, barium swallow 05/02/2020 without signs of aspiration --Discontinue core track and tube feeds today  Chronic diastolic congestive heart failure TTE with LVEF 60 to 90%, grade 2 diastolic dysfunction, circumferential pericardial effusion, IVC normal in size, mild MR, no aortic stenosis. --Metoprolol 12.5 mg p.o. twice daily  Intellectual delay --Get up during the day to bedside chair --Encourage a familiar face to remain present throughout the day --Keep blinds open and lights on during daylight hours --Minimize the use of opioids --Seroquel 75 mg daily, 150 mg nightly --Diazepam 2 mg qHS --Clonazepam 1 mg TID  HLD: Continue atorvastatin 20 mg daily  GERD: Protonix 40 mg daily  Weakness/debility/deconditioning: --Continue PT/OT efforts while inpatient --Currently recommending home with 24-hour assistance vs SNF vs CIR   DVT prophylaxis: Lovenox Code Status: Full code Family Communication: no family present at bedside this am.  Disposition Plan:  Status is: Inpatient  Remains inpatient appropriate because:Altered mental status, Ongoing diagnostic testing needed not appropriate for outpatient work up, Unsafe d/c plan, IV treatments appropriate due to intensity of illness or inability to take PO and Inpatient level of care appropriate due to severity of illness   Dispo: The patient is from: Home              Anticipated d/c is to: To be determined, home with home health versus SNF              Anticipated d/c date is: 2 days              Patient currently is not medically stable to d/c.   Consultants:   Neurology, signed off 04/23/2020  PCCM, signed off 04/29/2020  Procedures:   Core track placement  RUE PICC 11/15  EEG:  Generalized seizure; BIRDs; spike and wave, generalized; continuous slow, generalized.  CT abdomen/pelvis: Findings suspicious for  pyelonephritis and small abscess in lower pole of left kidney. Suggest correlation with urinalysis, and recommend continued follow-up by CT to confirm resolution. No evidence of ureteral calculi or hydronephrosis.  US Renal 11/23:  Stable hypoechoic lesion within the lower pole of the left kidney. While this may represent a small perinephric abscess, as suggested on the prior study, and infected renal cyst cannot be excluded.  Antimicrobials:   Azithromycin 11/14 - 11/15  Zosyn 11/14 -11/15  Ceftriaxone 11/12 - 11/25  Fluconazole 11/21 -11/21      Objective: Vitals:   05/03/20 0755 05/03/20 0804 05/03/20 1156 05/03/20 1202  BP: 127/69 122/69 119/67 111/67  Pulse: (!) 110 (!) 109 (!) 101 99  Resp: (!) 27 20 (!) 26 (!) 22  Temp: 99.6 F (37.6 C) 99 F (37.2 C) 99.2 F (37.3 C) 97.9 F (36.6 C)  TempSrc: Oral Oral Oral Oral  SpO2: 97% 96% 96% 95%  Weight:      Height:        Intake/Output Summary (Last 24 hours) at 05/03/2020 1522 Last data filed at 05/03/2020 1459 Gross per 24 hour  Intake 360 ml  Output 1100 ml  Net -740 ml   Filed Weights   04/29/20 0313 05/02/20 0400 05/03/20 0400  Weight: 56.9 kg 63.5 kg 62.9 kg    Examination:  Awake Alert, oriented to name and place only, not time . symmetrical Chest wall movement, Good air movement bilaterally, CTAB RRR,No Gallops,Rubs or new Murmurs, No Parasternal Heave +ve B.Sounds, Abd Soft, No tenderness, No rebound - guarding or rigidity. No Cyanosis, Clubbing or edema, No new Rash or bruise  Data Reviewed: I have personally reviewed following labs and imaging studies  CBC: Recent Labs  Lab 04/29/20 0329 04/30/20 0325 05/01/20 0400 05/02/20 0301 05/03/20 0406  WBC 14.3* 13.6* 13.3* 15.5* 17.2*  HGB 8.2* 8.7* 8.4* 8.3* 8.5*  HCT 27.5* 28.7* 27.8* 26.9* 26.9*  MCV 101.9* 102.9* 101.5* 100.7* 98.9  PLT 383 433* 394 444* 007*   Basic Metabolic Panel: Recent Labs  Lab 04/29/20 0329 04/30/20 0325  05/01/20 0400 05/02/20 0301 05/03/20 0406  NA 143 143 141 138 137  K 4.5 4.5 4.0 4.3 4.2  CL 102 102 100 97* 96*  CO2 26 28 27 28 24   GLUCOSE 201* 207* 180* 159* 108*  BUN 34* 35* 34* 36* 33*  CREATININE 0.82 0.92 0.80 0.98 0.92  CALCIUM 9.3 9.8 9.6 9.5 9.4  MG 2.0 2.3 2.3 2.3 2.2  PHOS 4.0 4.6 4.7* 4.5 4.6   GFR: Estimated Creatinine Clearance: 62.4 mL/min (by C-G formula based on SCr of 0.92 mg/dL). Liver Function Tests: No results for input(s): AST, ALT, ALKPHOS, BILITOT, PROT, ALBUMIN in the last 168 hours. No results for input(s): LIPASE, AMYLASE in the last 168 hours. No results for input(s): AMMONIA in the last 168 hours. Coagulation Profile: No results for input(s): INR, PROTIME in the last 168 hours. Cardiac Enzymes: No results for input(s): CKTOTAL, CKMB, CKMBINDEX, TROPONINI in the last 168 hours. BNP (last 3 results) No results for input(s): PROBNP in the last 8760 hours. HbA1C: No results for input(s): HGBA1C in the last 72 hours. CBG: Recent Labs  Lab 05/02/20 2007 05/03/20 0006 05/03/20 0427 05/03/20 0753 05/03/20 1154  GLUCAP 101* 112* 104* 94 116*   Lipid Profile: No results for input(s): CHOL, HDL, LDLCALC, TRIG, CHOLHDL, LDLDIRECT in the last 72 hours. Thyroid Function Tests: No results for input(s): TSH, T4TOTAL, FREET4, T3FREE, THYROIDAB in the last 72 hours. Anemia Panel: No results for input(s): VITAMINB12, FOLATE, FERRITIN, TIBC, IRON, RETICCTPCT in the last 72 hours. Sepsis Labs: No results for input(s): PROCALCITON, LATICACIDVEN in the last 168 hours.  Recent Results (from the past 240 hour(s))  Culture, Urine     Status: None   Collection Time: 04/26/20  2:38 PM   Specimen: Urine, Random  Result Value Ref Range Status   Specimen Description URINE, RANDOM  Final   Special Requests NONE  Final   Culture   Final    NO GROWTH Performed at [facility], [address] [place], [place] [address]    Report Status 04/27/2020 FINAL   Final         Radiology Studies: DG Chest 2 View  Result Date: 05/02/2020 CLINICAL DATA:  Cough EXAM: CHEST - 2 VIEW COMPARISON:  Film from the previous day. FINDINGS: Cardiac shadow is within normal limits. Feeding catheter is noted extending into the stomach. Right-sided PICC line is noted in satisfactory position. The lungs are well aerated without focal infiltrate or effusion. No bony abnormality is seen. IMPRESSION: No acute abnormality noted. Electronically Signed   By: Inez Catalina M.D.   On: 05/02/2020 00:15   DG Swallowing Func-Speech Pathology  Result Date: 05/02/2020 Objective Swallowing Evaluation: Type of Study: MBS-Modified Barium Swallow Study  Patient Details Name: NAVNEET SCHMUCK MRN: [id number] Date of Birth: [birth year] Today's Date: 05/02/2020 Time: SLP Start Time (ACUTE ONLY): 1330 -SLP Stop Time (ACUTE ONLY): 1400 SLP Time Calculation (min) (ACUTE ONLY): 30 min Past Medical History: Past Medical History: Diagnosis Date . Constipation  . Dysphagia  . Fracture  R foot . GERD (gastroesophageal reflux disease)  . History of shingles 10/2016 . Mental retardation   lesion in head . Seizures ([place])   "not fully controlled on max doses of meds" (Neuro ofc note 12/2014) . Thrombocytopenia ([place])  . Tremor  Past Surgical History: Past Surgical History: Procedure Laterality Date . BIOPSY  09/16/2014  Procedure: BIOPSY;  Surgeon: Rogene Houston, MD;  Location: [facility] OR[facility];  Service: Endoscopy;; . CATARACT EXTRACTION    both eyes, May of 2015 . COLONOSCOPY   . COLONOSCOPY WITH PROPOFOL N/A 11/20/2018  Procedure: COLONOSCOPY WITH PROPOFOL;  Surgeon: Rogene Houston, MD;  Location: [facility] ENDO SUITE;  Service: Endoscopy;  Laterality: N/A; . ESOPHAGEAL DILATION N/A 09/16/2014  Procedure: ESOPHAGEAL DILATION WITH 54FR MALONEY DILATOR;  Surgeon: Rogene Houston, MD;  Location: [facility] ORS;  Service: Endoscopy;  Laterality: N/A; . ESOPHAGOGASTRODUODENOSCOPY (EGD) WITH PROPOFOL N/A 09/16/2014  Procedure:  ESOPHAGOGASTRODUODENOSCOPY (EGD) WITH PROPOFOL;  Surgeon: Rogene Houston, MD;  Location: [facility];  Service: Endoscopy;  Laterality: N/A; . MOUTH SURGERY   . POLYPECTOMY  11/20/2018  Procedure: POLYPECTOMY;  Surgeon: Rogene Houston, MD;  Location: [facility];  Service: Endoscopy;;  colon . Skin graft to gum Right 08/2013 . TONSILLECTOMY AND ADENOIDECTOMY   . TOTAL ABDOMINAL HYSTERECTOMY   HPI: 59 yo admitted 11/12 with sepsis, pyelonephritis and encephalopathy. PMhx: epilepsy, intellectual disability, GERD, EGD with dilation 2016.  Initial limited clinical swallow eval completed 11/17 with recs to continue NPO.  SLP s/o at that time due to ongoing lethargy.  Cortrak for nutrition.  Has been having breakthrough seizures.  Improved mentation, diet has been advanced slowly as she has improved.  Subjective: alert, very confused Assessment / Plan / Recommendation CHL IP CLINICAL IMPRESSIONS 05/02/2020 Clinical Impression Pt presents with quite functional swallow.  She demonstrated some initial reluctance to eat the solid/pureed barium, which caused hesitation while eating, requiring prompts to chew and swallow (this creates what appears to be an oral delay on the imaging).  She demonstrated reliable laryngeal vestibule closure across all trials, excluding one episode of penetration of thin liquids (considered WFL). There was no aspiration, despite large, successive boluses of thin and mixed solid/thin liquid consistencies.  There was trace vallecular residue, insignificant.  Recommend advancing diet to a dysphagia 3, thin liquids and D/Cing the cortrak.  Pt will need encouragement and assistance to feed herself - recommend removing the mitts as much as able (which should be more feasible with cortrak removed) to encourage independence.  SLP will follow briefly to ensure safety.  Sent a message via Secure Chat to Dr. [place] and spoke with pt's RN.  SLP Visit Diagnosis Dysphagia, unspecified (R13.10) Attention and  concentration deficit following -- Frontal lobe and executive function deficit following -- Impact on safety and function --   CHL IP TREATMENT RECOMMENDATION 05/02/2020 Treatment Recommendations Therapy as outlined in treatment plan below   Prognosis 05/02/2020 Prognosis for Safe Diet Advancement Good Barriers to Reach Goals -- Barriers/Prognosis Comment -- [facility]L IP DIET RECOMMENDATION 05/02/2020 SLP Diet Recommendations Dysphagia 3 (Mech soft) solids;Thin liquid Liquid Administration via Cup;Straw Medication Administration Whole meds with puree Compensations Minimize environmental distractions Postural Changes --   CH[facility] IP OTHER RECOMMENDATIONS 05/02/2020 Recommended Consults -- Oral Care Recommendations Oral care BID Other Recommendations --   [facility] IP FOLLOW UP RECOMMENDATIONS 05/01/2020 Follow up Recommendations Skilled Nursing facility   [facility] IP FREQUENCY AND DURATION 05/02/2020 Speech Therapy Frequency (ACUTE ONLY) min 2x/week Treatment Duration 1 week  CHL IP ORAL PHASE 05/02/2020 Oral Phase WFL Oral - Pudding Teaspoon -- Oral - Pudding Cup -- Oral - Honey Teaspoon -- Oral - Honey Cup -- Oral - Nectar Teaspoon -- Oral - Nectar Cup -- Oral - Nectar Straw -- Oral - Thin Teaspoon -- Oral - Thin Cup -- Oral - Thin Straw -- Oral - Puree -- Oral - Mech Soft -- Oral - Regular -- Oral - Multi-Consistency -- Oral - Pill -- Oral Phase - Comment --  CHL IP PHARYNGEAL PHASE 05/02/2020 Pharyngeal Phase WFL Pharyngeal- Pudding Teaspoon -- Pharyngeal -- Pharyngeal- Pudding Cup -- Pharyngeal -- Pharyngeal- Honey Teaspoon -- Pharyngeal -- Pharyngeal- Honey Cup -- Pharyngeal -- Pharyngeal- Nectar Teaspoon -- Pharyngeal -- Pharyngeal- Nectar Cup -- Pharyngeal -- Pharyngeal- Nectar Straw -- Pharyngeal -- Pharyngeal- Thin Teaspoon -- Pharyngeal -- Pharyngeal- Thin Cup -- Pharyngeal -- Pharyngeal- Thin Straw -- Pharyngeal -- Pharyngeal- Puree -- Pharyngeal -- Pharyngeal- Mechanical Soft -- Pharyngeal -- Pharyngeal- Regular --  Pharyngeal -- Pharyngeal- Multi-consistency -- Pharyngeal -- Pharyngeal- Pill -- Pharyngeal -- Pharyngeal Comment --  No flowsheet data found. [name] 05/02/2020, 2:59 PM                   Scheduled Meds: . atorvastatin  20 mg Oral QPM  . bisacodyl  10 mg Rectal Daily  . chlorhexidine  15 mL Mouth Rinse BID  . Chlorhexidine Gluconate Cloth  6 each Topical Daily  . clonazePAM  1 mg Oral TID  . diazepam  2 mg Oral QHS  . docusate sodium  100 mg Oral BID  . enoxaparin (LOVENOX) injection  40 mg Subcutaneous Q24H  . feeding supplement  237 mL Oral TID BM  . insulin aspart  0-9 Units Subcutaneous Q4H  . lacosamide  200 mg Oral BID  . mouth rinse  15 mL Mouth Rinse q12n4p  . metoprolol tartrate  25 mg Oral BID  . multivitamin with minerals  1 tablet Oral Daily  . pantoprazole  40 mg Oral Q supper  . polyethylene glycol  17 g Per Tube Daily  . QUEtiapine  150 mg Oral QHS  . QUEtiapine  75 mg Oral Daily  . Resource ThickenUp Clear   Oral Once  . sodium chloride flush  10-40 mL Intracatheter Q12H  . valproic acid  500 mg Per Tube BID   Continuous Infusions: . sodium chloride Stopped (04/23/20 2104)     LOS: 19 days    Time spent: 38 minutes spent on chart review, discussion with nursing staff, consultants, updating family and interview/physical exam; more than 50% of that time was spent in counseling and/or coordination of care.    Gabriela Giannelli, DO [facility] Available via Epic secure chat 7am-7pm After these hours, please refer to coverage provider listed on [email or web address] 05/03/2020, 3:22 PM

## 2020-05-03 NOTE — [place] FL2 (Signed)
[place] MEDICAID FL2 LEVEL OF CARE SCREENING TOOL     IDENTIFICATION  Patient Name: Maria [name] Birthdate: [birth year] Sex: female Admission Date (Current Location): 04/14/2020  [facility] and [place] Number:  Herbalist and Address:  [facility], [place] [address] [place],  [address]      Provider Number: [id number]  Attending Physician Name and Address:  Albertine Patricia, MD  Relative Name and Phone Number:  Romie Minus (mother) [phone]    Current Level of Care: Hospital Recommended Level of Care: [place] Prior Approval Number:    Date Approved/Denied:   PASRR Number: Pending  Discharge Plan: SNF    Current Diagnoses: Patient Active Problem List   Diagnosis Date Noted  . Pressure injury of skin 04/24/2020  . Palliative care by specialist   . Pyelonephritis of left kidney 04/14/2020  . Renal abscess 04/14/2020  . Leukocytosis 04/14/2020  . Fever 04/14/2020  . LLQ abdominal pain 04/14/2020  . Dysphagia   . AKI (acute kidney injury) ([place])   . Sepsis secondary to UTI ([place])   . Pain in left ankle and joints of left foot 12/02/2019  . Positive colorectal cancer screening using Cologuard test 10/28/2018  . Posterior tibial tendinitis, right leg 04/23/2016  . Pain in right foot 04/12/2016  . Acute encephalopathy   . Myoclonus   . Frequent falls 11/09/2015  . Fall 11/09/2015  . Chest pain 03/16/2014  . Upper abdominal pain 03/16/2014  . Seizure disorder ([place]) 03/16/2014  . 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 Generalized convulsive epilepsy ([place]) 10/06/2012  . DNR (do not resuscitate) discussion 10/06/2012  . Intellectual disability   . Thrombocytopenia ([place]) 05/23/2011  . Seizures ([place]) 03/21/2011  . GERD (gastroesophageal reflux disease) 03/21/2011  . CLOSED FRACTURE OF ACROMIAL END OF CLAVICLE 07/13/2008    Orientation RESPIRATION BLADDER Height & Weight     Self, Situation, Place   Normal Incontinent, External catheter Weight: 138 lb 10.7 oz (62.9 kg) Height:  5\' 6"  (167.6 cm)  BEHAVIORAL SYMPTOMS/MOOD NEUROLOGICAL BOWEL NUTRITION STATUS    Convulsions/Seizures Continent Diet (See DC summary)  AMBULATORY STATUS COMMUNICATION OF NEEDS Skin   Limited Assist Verbally Other (Comment) (Pressure injury stage 2 (right medial back))                       Personal Care Assistance Level of Assistance  Bathing, Feeding, Dressing Bathing Assistance: Maximum assistance Feeding assistance: Limited assistance Dressing Assistance: Limited assistance     Functional Limitations Info  Speech, Hearing, Sight Sight Info: Adequate Hearing Info: Adequate Speech Info: Adequate    SPECIAL CARE FACTORS FREQUENCY  PT (By licensed PT), OT (By licensed OT)     PT Frequency: 5X per week OT Frequency: 5X per week            Contractures      Additional Factors Info  Code Status, Allergies, Insulin Sliding Scale, Psychotropic Code Status Info: FULL Allergies Info: Lamictal (lamotrigine), Codeine Psychotropic Info: Seroquel, Depakene Insulin Sliding Scale Info: See DC summary       Current Medications (05/03/2020):  This is the current hospital active medication list Current Facility-Administered Medications  Medication Dose Route Frequency Provider Last Rate Last Admin  . 0.9 %  sodium chloride infusion   Intravenous PRN Murlean Iba, MD   Stopped at 04/23/20 2104  . acetaminophen (TYLENOL) tablet 500 mg  500 mg Oral Q6H PRN [place] Eric J,  DO      . atorvastatin (LIPITOR) tablet 20 mg  20 mg Oral QPM [place] Eric J, DO      . bisacodyl (DULCOLAX) suppository 10 mg  10 mg Rectal Daily Iona Beard, MD   10 mg at 05/03/20 0918  . chlorhexidine (PERIDEX) 0.12 % solution 15 mL  15 mL Mouth Rinse BID Johnson, Clanford L, MD   15 mL at 05/03/20 0920  . Chlorhexidine Gluconate Cloth 2 % PADS 6 each  6 each Topical Daily Johnson, Clanford L, MD   6 each at 05/03/20  0930  . clonazePAM (KLONOPIN) tablet 1 mg  1 mg Oral TID [place] Eric J, DO   1 mg at 05/03/20 0919  . diazepam (VALIUM) tablet 2 mg  2 mg Oral QHS [place] Eric J, DO      . docusate sodium (COLACE) capsule 100 mg  100 mg Oral BID [place] Eric J, DO   100 mg at 05/03/20 0919  . enoxaparin (LOVENOX) injection 40 mg  40 mg Subcutaneous Q24H Johnson, Clanford L, MD   40 mg at 05/02/20 2156  . feeding supplement (ENSURE ENLIVE / ENSURE PLUS) liquid 237 mL  237 mL Oral TID BM [place] Eric J, DO   237 mL at 05/03/20 9935  . guaiFENesin-dextromethorphan (ROBITUSSIN DM) 100-10 MG/5ML syrup 10 mL  10 mL Oral Q4H PRN [place] Eric J, DO   10 mL at 05/03/20 7017  . insulin aspart (novoLOG) injection 0-9 Units  0-9 Units Subcutaneous Q4H Anders Simmonds, MD   1 Units at 05/02/20 0800  . ipratropium-albuterol (DUONEB) 0.5-2.5 (3) MG/3ML nebulizer solution 3 mL  3 mL Nebulization Q4H PRN Candee Furbish, MD   3 mL at 04/24/20 0401  . lacosamide (VIMPAT) tablet 200 mg  200 mg Oral BID [place] Eric J, DO   200 mg at 05/03/20 0919  . lip balm (CARMEX) ointment   Topical PRN Spero Geralds, MD   Given at 04/27/20 0235  . LORazepam (ATIVAN) injection 2 mg  2 mg Intravenous PRN Seawell, Jaimie A, DO   2 mg at 04/24/20 0534  . MEDLINE mouth rinse  15 mL Mouth Rinse q12n4p Johnson, Clanford L, MD   15 mL at 05/02/20 1655  . metoprolol tartrate (LOPRESSOR) tablet 25 mg  25 mg Oral BID [place] Eric J, DO   25 mg at 05/03/20 7939  . multivitamin with minerals tablet 1 tablet  1 tablet Oral Daily [place] Eric J, DO   1 tablet at 05/03/20 0919  . ondansetron (ZOFRAN) tablet 4 mg  4 mg Oral Q6H PRN [place] Donnamarie Poag, DO       Or  . ondansetron [facility]) injection 4 mg  4 mg Intravenous Q6H PRN [place] Eric J, DO      . pantoprazole (PROTONIX) EC tablet 40 mg  40 mg Oral Q supper [place] Eric J, DO      . phenol (CHLORASEPTIC) mouth spray 1 spray  1 spray Mouth/Throat Q6H PRN Margaretmary Lombard, MD   1 spray at 04/27/20  0234  . polyethylene glycol (MIRALAX / GLYCOLAX) packet 17 g  17 g Per Tube Daily Candee Furbish, MD   17 g at 05/03/20 0917  . QUEtiapine (SEROQUEL) tablet 150 mg  150 mg Oral QHS [place] Eric J, DO      . QUEtiapine (SEROQUEL) tablet 75 mg  75 mg Oral Daily [place] Eric J, DO   75 mg at 05/03/20 0919  . Resource  ThickenUp Clear   Oral Once Candee Furbish, MD      . sodium chloride flush (NS) 0.9 % injection 10-40 mL  10-40 mL Intracatheter Q12H Candee Furbish, MD   10 mL at 05/02/20 2200  . sodium chloride flush (NS) 0.9 % injection 10-40 mL  10-40 mL Intracatheter PRN Candee Furbish, MD   10 mL at 04/26/20 0217  . valproic acid (DEPAKENE) 250 MG/5ML solution 500 mg  500 mg Per Tube BID Candee Furbish, MD   500 mg at 05/03/20 5259     Discharge Medications: Please see discharge summary for a list of discharge medications.  Relevant Imaging Results:  Relevant Lab Results:   Additional Information SSN: [id number]  Glennon Hamilton, Student-Social Work

## 2020-05-03 NOTE — Progress Notes (Signed)
  Speech Language Pathology Treatment: Dysphagia  Patient Details Name: Maria [name] MRN: [id number] DOB: 04-Feb-1961 Today's Date: 05/03/2020 Time: 0910-0930 SLP Time Calculation (min) (ACUTE ONLY): 20 min  Assessment / Plan / Recommendation Clinical Impression  F/u after yesterday's MBS.  Pt requires a lot of encouragement to eat; she is more inclined to drink liquids.  She is doing much better with feeding herself after set-up.  Voice is clear and strong during meal; there is the occasional cough, but this is not necessarily associated with PO intake.  Per MBS, pt's swallow is quite functional - occasional coughing should not create alarm among staff.    Pt did well taking her pills with water, aside from large multivitamin which may be more easily swallowed with applesauce.  When offering food, she tends to respond to most questions with "no," but if gently encouraged she will eat.  No overt s/s of aspiration during today's session.  SLP to follow briefly.     HPI HPI: 59 yo admitted 11/12 with sepsis, pyelonephritis and encephalopathy. PMhx: epilepsy, intellectual disability, GERD, EGD with dilation 2016.  Initial limited clinical swallow eval completed 11/17 with recs to continue NPO.  SLP s/o at that time due to ongoing lethargy.  Cortrak for nutrition.  Has been having breakthrough seizures.  Improved mentation, diet has been advanced slowly as she has improved.  MBS 11/30 with functional swallow, no aspiration, mild oral deficits.  Cortrak D/Cd 11/30.       SLP Plan  Continue with current plan of care       Recommendations  Diet recommendations: Dysphagia 3 (mechanical soft);Thin liquid Liquids provided via: Cup;Straw Medication Administration: Whole meds with liquid Supervision: Patient able to self feed Compensations: Minimize environmental distractions Postural Changes and/or Swallow Maneuvers: Seated upright 90 degrees                Oral Care Recommendations:  Oral care BID Follow up Recommendations: Inpatient Rehab SLP Visit Diagnosis: Dysphagia, unspecified (R13.10) Plan: Continue with current plan of care       GO               Maria [name], [place] Office number [phone] Pager [phone]  Maria [name] 05/03/2020, 9:52 AM

## 2020-05-03 NOTE — Progress Notes (Signed)
Occupational Therapy Treatment Patient Details Name: Maria [name] MRN: [id number] DOB: Jul 20, 1960 Today's Date: 05/03/2020    History of present illness 59 yo admitted with sepsis, pyelonephritis and encephalopathy. 11/18 Sz with continuous EEG. PMhx: epilepsy, intellectual disability, GERD   OT comments  Pt making progress with functional goals, however pt required encouragement for EOB and OOB activity. Pt sat EOB to participate in UB ADLs, grooming, transfers to [facility] with mod A, total A with pericare/hygiene. OT will continue to follow acutely to maximize level of function and safety  Follow Up Recommendations  SNF;Supervision/Assistance - 24 hour    Equipment Recommendations  Other (comment) (TBD at next venue of care)    Recommendations for Other Services      Precautions / Restrictions Precautions Precautions: Fall       Mobility Bed Mobility Overal bed mobility: Needs Assistance Bed Mobility: Supine to Sit;Sit to Supine Rolling: Mod assist   Supine to sit: Mod assist;HOB elevated Sit to supine: Min assist      Transfers Overall transfer level: Needs assistance Equipment used: 2 person hand held assist;Rolling walker (2 wheeled) Transfers: Sit to/from [employer] Transfers   Stand pivot transfers: +2 physical assistance;Mod assist       General transfer comment: Pt stood EOB with two person hand held assist  with  to [facility] after reporting the need to have to urinate.  Total assist for peri care and still mod assist of one to transfer back to bed with RW, uncontrolled premature sit to bed.    Balance Overall balance assessment: Needs assistance Sitting-balance support: Feet supported;Bilateral upper extremity supported;No upper extremity supported Sitting balance-Leahy Scale: Poor Sitting balance - Comments: need min to mod assist EOB due to posterior lean/LOB when therapist releases support.     Standing balance support: Bilateral upper extremity  supported Standing balance-Leahy Scale: Poor                             ADL either performed or assessed with clinical judgement   ADL       Grooming: Wash/dry face;Min guard;Sitting;Wash/dry hands   Upper Body Bathing: Minimal assistance;Sitting Upper Body Bathing Details (indicate cue type and reason): simulated seated EOB     Upper Body Dressing : Minimal assistance Upper Body Dressing Details (indicate cue type and reason): donned clean gown seated EOB     Toilet Transfer: Moderate assistance;+2 for physical assistance;Squat-pivot   Toileting- Clothing Manipulation and Hygiene: Total assistance               Vision Patient Visual Report: No change from baseline     Perception     Praxis      Cognition Arousal/Alertness: Awake/alert Behavior During Therapy: Flat affect;Impulsive Overall Cognitive Status: Impaired/Different from baseline Area of Impairment: Attention;Following commands;Safety/judgement;Awareness;Problem solving                       Following Commands: Follows one step commands with increased time;Follows one step commands inconsistently Safety/Judgement: Decreased awareness of safety;Decreased awareness of deficits Awareness: Intellectual Problem Solving: Slow processing;Difficulty sequencing;Requires verbal cues;Requires tactile cues;Decreased initiation General Comments: Pt following basic commands at times with delay, needs multimodal cues to initiate movement to EOB.  Sustained attention in busy room, but often distracted by sister and RN who were also talking/in room working.  Verbalized need to use the bathroom appropriately once EOB.  Exercises     Shoulder Instructions       General Comments      Pertinent Vitals/ Pain       Pain Location: back Pain Descriptors / Indicators: Grimacing Pain Intervention(s): Limited activity within patient's tolerance;Repositioned  Home Living                                           Prior Functioning/Environment              Frequency  Min 2X/week        Progress Toward Goals  OT Goals(current goals can now be found in the care plan section)  Progress towards OT goals: Progressing toward goals     Plan Discharge plan remains appropriate    Co-evaluation                 AM-PAC OT "6 Clicks" Daily Activity     Outcome Measure   Help from another person eating meals?: A Lot Help from another person taking care of personal grooming?: A Little Help from another person toileting, which includes using toliet, bedpan, or urinal?: A Lot Help from another person bathing (including washing, rinsing, drying)?: A Lot Help from another person to put on and taking off regular upper body clothing?: A Lot Help from another person to put on and taking off regular lower body clothing?: Total 6 Click Score: 12    End of Session Equipment Utilized During Treatment: Gait belt  OT Visit Diagnosis: Unsteadiness on feet (R26.81);Other abnormalities of gait and mobility (R26.89);Muscle weakness (generalized) (M62.81);Other symptoms and signs involving cognitive function   Activity Tolerance Patient limited by fatigue   Patient Left with call bell/phone within reach;in bed;with family/visitor present;with bed alarm set   Nurse Communication          Time: 1443-1540 OT Time Calculation (min): 24 min  Charges: OT General Charges $OT Visit: 1 Visit OT Treatments $Self Care/Home Management : 8-22 mins $Therapeutic Activity: 8-22 mins     Britt Bottom 05/03/2020, 4:50 PM

## 2020-05-03 NOTE — Progress Notes (Signed)
Routine line care: Purple lumen found without cap. Hub cleaned and cap applied. Site otherwise unremarkable.

## 2020-05-04 DIAGNOSIS — G40309 Generalized idiopathic epilepsy and epileptic syndromes, not intractable, without status epilepticus: Secondary | ICD-10-CM | POA: Diagnosis not present

## 2020-05-04 DIAGNOSIS — N12 Tubulo-interstitial nephritis, not specified as acute or chronic: Secondary | ICD-10-CM | POA: Diagnosis not present

## 2020-05-04 LAB — CBC
HCT: 28.1 % — ABNORMAL LOW (ref 36.0–46.0)
Hemoglobin: 8.6 g/dL — ABNORMAL LOW (ref 12.0–15.0)
MCH: 30.4 pg (ref 26.0–34.0)
MCHC: 30.6 g/dL (ref 30.0–36.0)
MCV: 99.3 fL (ref 80.0–100.0)
Platelets: 414 10*3/uL — ABNORMAL HIGH (ref 150–400)
RBC: 2.83 MIL/uL — ABNORMAL LOW (ref 3.87–5.11)
RDW: 15.1 % (ref 11.5–15.5)
WBC: 12.1 10*3/uL — ABNORMAL HIGH (ref 4.0–10.5)
nRBC: 0 % (ref 0.0–0.2)

## 2020-05-04 LAB — GLUCOSE, CAPILLARY
Glucose-Capillary: 100 mg/dL — ABNORMAL HIGH (ref 70–99)
Glucose-Capillary: 111 mg/dL — ABNORMAL HIGH (ref 70–99)
Glucose-Capillary: 121 mg/dL — ABNORMAL HIGH (ref 70–99)
Glucose-Capillary: 125 mg/dL — ABNORMAL HIGH (ref 70–99)
Glucose-Capillary: 91 mg/dL (ref 70–99)
Glucose-Capillary: 96 mg/dL (ref 70–99)

## 2020-05-04 LAB — BASIC METABOLIC PANEL
Anion gap: 16 — ABNORMAL HIGH (ref 5–15)
BUN: 21 mg/dL — ABNORMAL HIGH (ref 6–20)
CO2: 26 mmol/L (ref 22–32)
Calcium: 9.8 mg/dL (ref 8.9–10.3)
Chloride: 96 mmol/L — ABNORMAL LOW (ref 98–111)
Creatinine, Ser: 0.99 mg/dL (ref 0.44–1.00)
GFR, Estimated: >60 mL/min (ref >60)
Glucose, Bld: 114 mg/dL — ABNORMAL HIGH (ref 70–99)
Potassium: 4.2 mmol/L (ref 3.5–5.1)
Sodium: 138 mmol/L (ref 135–145)

## 2020-05-04 LAB — PROCALCITONIN: Procalcitonin: <0.1 ng/mL

## 2020-05-04 NOTE — Progress Notes (Signed)
PROGRESS NOTE    Maria [name]  Maria [name] DOB: 05/06/1961 DOA: 04/14/2020 PCP: Celene Squibb, MD    Brief Narrative:   Maria [name] is a 59 year old female with past medical history significant for epilepsy, intellectual delay, GERD, dysphagia/esophageal stricture, chronic diastolic congestive heart failure who was admitted on 04/14/2020 and transferred from [facility] to [facility], ICU on 04/16/2020 with septic shock secondary to E. coli UTI/left pyelonephritis complicated by breakthrough seizures.  Patient has been weaned from vasopressors and completed antibiotic course for E. coli/pyelonephritis septic shock.  Continues with waxing and waning mentation and dysphagia, currently requiring tube feeds for nutrition.  Transferred from PCCM service to hospitalist service on 04/29/2020.  Subjective:  Significant events overnight as discussed with staff, she herself denies any complaints.  Assessment & Plan:   Principal Problem:   Pyelonephritis of left kidney Active Problems:   Seizures (HCC)   GERD (gastroesophageal reflux disease)   Intellectual disability   Generalized convulsive epilepsy ([place])   DNR (do not resuscitate) discussion   Seizure disorder ([place])   Frequent falls   Renal abscess   Leukocytosis   Fever   LLQ abdominal pain   Dysphagia   AKI (acute kidney injury) ([place])   Sepsis secondary to UTI [facility])   Palliative care by specialist   Pressure injury of skin   Septic shock due E. coli UTI and Left pyelonephritis - Patient initially presenting to the hospital with left lower quadrant abdominal pain and found to have a urinary tract infection with sepsis and was admitted for IV antibiotic treatment.  During her initial course, her blood pressure continued to decline and ultimately required transfer to [facility], ICU under the pulmonary critical care service for need of vasopressors.  CT abdomen/pelvis with findings suspicious for left-sided  pyelonephritis and small abscess lower pole of left kidney.  Urine culture positive for E. coli.  Patient initially started on azithromycin and Zosyn and completed course of ceftriaxone.  Patient was eventually titrated off of vasopressors and repeat urinalysis on 04/26/2020 with no growth. --Continue supportive care -She is afebrile, nontoxic-appearing, will repeat CBC to ensure leukocytosis is trending down, will check procalcitonin as well   Epilepsy with breakthrough seizures During initial hospitalization, patient developed seizure activity.  Etiology likely provoked from acute infectious process as above.  Patient was seen by neurology and started on her home antiepileptics.  She was monitored on EEG with no further seizure activity appreciated and generalized spike and wave discharges were likely consistent with patient's known history of generalized epilepsy.  Neurology signed off on 04/23/2020 with no further inpatient work-up indicated with recommendations of follow-up with outpatient neurology. --valproic acid 500 mg BID per tube --Vimpat 200mg  BID per tube --Seizure precautions --Ativan IV as needed breakthrough seizure  Dysphagia Hx esophageal stricture Patient follows with gastroenterology outpatient, Dr. Laural Golden.  Has required several esophageal dilations for strictures in the past. --SLP following, appreciate assistance on dysphagia 3 thin liquid. --Patient did well with barium swallow on 05/02/2020  Concern for left kidney abscess versus cyst Initial CT abdomen/pelvis notable for left kidney lower pole abscess measuring 2.5 x 2.4 cm.  Follow-up renal ultrasound 04/25/2020 shows stable hypoechoic lesion lower pole left kidney consistent with small perinephric abscess versus renal cyst.  Have discussed with urology Dr. Junious Silk, apparently this is an old cyst present even on previous CT scan in 2014 with no changes.  This is most likely related to old cyst, no further work-up is  indicated at this point.  Moderate protein calorie malnutrition Body mass index is 21.99 kg/m. Nutrition Status: Nutrition Problem: Inadequate oral intake Etiology: lethargy/confusion Signs/Symptoms: NPO status Interventions: Tube feeding --SLP/nutrition continues to follow, barium swallow 05/02/2020 without signs of aspiration --Discontinue core track and tube feeds today  Chronic diastolic congestive heart failure TTE with LVEF 60 to 52%, grade 2 diastolic dysfunction, circumferential pericardial effusion, IVC normal in size, mild MR, no aortic stenosis. --Metoprolol 12.5 mg p.o. twice daily  Intellectual delay --Get up during the day to bedside chair --Encourage a familiar face to remain present throughout the day --Keep blinds open and lights on during daylight hours --Minimize the use of opioids --Seroquel 75 mg daily, 150 mg nightly --Diazepam 2 mg qHS --Clonazepam 1 mg TID  HLD: Continue atorvastatin 20 mg daily  GERD: Protonix 40 mg daily  Weakness/debility/deconditioning: --Continue PT/OT efforts while inpatient --Currently recommending home with 24-hour assistance vs SNF vs CIR   DVT prophylaxis: Lovenox Code Status: Full code Family Communication: no family present at bedside this am.  Disposition Plan:  Status is: Inpatient  Remains inpatient appropriate because:Altered mental status, Ongoing diagnostic testing needed not appropriate for outpatient work up, Unsafe d/c plan, IV treatments appropriate due to intensity of illness or inability to take PO and Inpatient level of care appropriate due to severity of illness   Dispo: The patient is from: Home              Anticipated d/c is to: CIR              Anticipated d/c date is: 2 days              Patient currently is not medically stable to d/c.   Consultants:   Neurology, signed off 04/23/2020  PCCM, signed off 04/29/2020  Procedures:   Core track placement  RUE PICC 11/15  EEG:   Generalized seizure; BIRDs; spike and wave, generalized; continuous slow, generalized.  CT abdomen/pelvis: Findings suspicious for pyelonephritis and small abscess in lower pole of left kidney. Suggest correlation with urinalysis, and recommend continued follow-up by CT to confirm resolution. No evidence of ureteral calculi or hydronephrosis.  US Renal 11/23:  Stable hypoechoic lesion within the lower pole of the left kidney. While this may represent a small perinephric abscess, as suggested on the prior study, and infected renal cyst cannot be excluded.  Antimicrobials:   Azithromycin 11/14 - 11/15  Zosyn 11/14 -11/15  Ceftriaxone 11/12 - 11/25  Fluconazole 11/21 -11/21      Objective: Vitals:   05/04/20 0400 05/04/20 0741 05/04/20 0918 05/04/20 1209  BP: 110/62 114/73 136/69 113/77  Pulse: 96 100 99 94  Resp: (!) 21 15  (!) 25  Temp: 98.2 F (36.8 C) 98.7 F (37.1 C)  98.9 F (37.2 C)  TempSrc: Oral Oral  Oral  SpO2: 93% (!) 89%  (!) 89%  Weight: 61.8 kg     Height:        Intake/Output Summary (Last 24 hours) at 05/04/2020 1437 Last data filed at 05/04/2020 0900 Gross per 24 hour  Intake 790 ml  Output 900 ml  Net -110 ml   Filed Weights   05/02/20 0400 05/03/20 0400 05/04/20 0400  Weight: 63.5 kg 62.9 kg 61.8 kg    Examination:  Awake Alert, frail, oriented to time and place, and year Symmetrical Chest wall movement, Good air movement bilaterally, CTAB RRR,No Gallops,Rubs or new Murmurs, No Parasternal Heave +ve B.Sounds, Abd Soft, No tenderness,  No rebound - guarding or rigidity. No Cyanosis, Clubbing or edema, No new Rash or bruise       Data Reviewed: I have personally reviewed following labs and imaging studies  CBC: Recent Labs  Lab 04/29/20 0329 04/30/20 0325 05/01/20 0400 05/02/20 0301 05/03/20 0406  WBC 14.3* 13.6* 13.3* 15.5* 17.2*  HGB 8.2* 8.7* 8.4* 8.3* 8.5*  HCT 27.5* 28.7* 27.8* 26.9* 26.9*  MCV 101.9* 102.9* 101.5* 100.7*  98.9  PLT 383 433* 394 444* 010*   Basic Metabolic Panel: Recent Labs  Lab 04/29/20 0329 04/30/20 0325 05/01/20 0400 05/02/20 0301 05/03/20 0406  NA 143 143 141 138 137  K 4.5 4.5 4.0 4.3 4.2  CL 102 102 100 97* 96*  CO2 26 28 27 28 24   GLUCOSE 201* 207* 180* 159* 108*  BUN 34* 35* 34* 36* 33*  CREATININE 0.82 0.92 0.80 0.98 0.92  CALCIUM 9.3 9.8 9.6 9.5 9.4  MG 2.0 2.3 2.3 2.3 2.2  PHOS 4.0 4.6 4.7* 4.5 4.6   GFR: Estimated Creatinine Clearance: 62.4 mL/min (by C-G formula based on SCr of 0.92 mg/dL). Liver Function Tests: No results for input(s): AST, ALT, ALKPHOS, BILITOT, PROT, ALBUMIN in the last 168 hours. No results for input(s): LIPASE, AMYLASE in the last 168 hours. No results for input(s): AMMONIA in the last 168 hours. Coagulation Profile: No results for input(s): INR, PROTIME in the last 168 hours. Cardiac Enzymes: No results for input(s): CKTOTAL, CKMB, CKMBINDEX, TROPONINI in the last 168 hours. BNP (last 3 results) No results for input(s): PROBNP in the last 8760 hours. HbA1C: No results for input(s): HGBA1C in the last 72 hours. CBG: Recent Labs  Lab 05/03/20 2023 05/04/20 0007 05/04/20 0411 05/04/20 0723 05/04/20 1207  GLUCAP 98 111* 91 96 121*   Lipid Profile: No results for input(s): CHOL, HDL, LDLCALC, TRIG, CHOLHDL, LDLDIRECT in the last 72 hours. Thyroid Function Tests: No results for input(s): TSH, T4TOTAL, FREET4, T3FREE, THYROIDAB in the last 72 hours. Anemia Panel: No results for input(s): VITAMINB12, FOLATE, FERRITIN, TIBC, IRON, RETICCTPCT in the last 72 hours. Sepsis Labs: No results for input(s): PROCALCITON, LATICACIDVEN in the last 168 hours.  Recent Results (from the past 240 hour(s))  Culture, Urine     Status: None   Collection Time: 04/26/20  2:38 PM   Specimen: Urine, Random  Result Value Ref Range Status   Specimen Description URINE, RANDOM  Final   Special Requests NONE  Final   Culture   Final    NO  GROWTH Performed at [facility], [address], [place], [place] [address]    Report Status 04/27/2020 FINAL  Final         Radiology Studies: No results found.      Scheduled Meds: . atorvastatin  20 mg Oral QPM  . bisacodyl  10 mg Rectal Daily  . chlorhexidine  15 mL Mouth Rinse BID  . Chlorhexidine Gluconate Cloth  6 each Topical Daily  . clonazePAM  1 mg Oral TID  . diazepam  2 mg Oral QHS  . docusate sodium  100 mg Oral BID  . enoxaparin (LOVENOX) injection  40 mg Subcutaneous Q24H  . feeding supplement  237 mL Oral TID BM  . insulin aspart  0-9 Units Subcutaneous Q4H  . lacosamide  200 mg Oral BID  . mouth rinse  15 mL Mouth Rinse q12n4p  . metoprolol tartrate  25 mg Oral BID  . multivitamin with minerals  1 tablet Oral  Daily  . pantoprazole  40 mg Oral Q supper  . polyethylene glycol  17 g Per Tube Daily  . QUEtiapine  150 mg Oral QHS  . QUEtiapine  75 mg Oral Daily  . Resource ThickenUp Clear   Oral Once  . sodium chloride flush  10-40 mL Intracatheter Q12H  . valproic acid  500 mg Per Tube BID   Continuous Infusions: . sodium chloride Stopped (04/23/20 2104)     LOS: 20 days    Phillips Climes, MD [facility] Available via Epic secure chat 7am-7pm After these hours, please refer to coverage provider listed on [email or web address] 05/04/2020, 2:37 PM

## 2020-05-04 NOTE — Progress Notes (Addendum)
Inpatient Rehabilitation Admissions Coordinator  CIR bed not available today. Noted WBC trends. I await Cir bed available for possible admit. I have left voicemail for sister, [name] , to call me to update on rehab options.  Danne Baxter, RN, MSN Rehab Admissions Coordinator [phone] 05/04/2020 11:50 AM

## 2020-05-04 NOTE — Plan of Care (Signed)
°  Problem: Clinical Measurements: Goal: Ability to maintain clinical measurements within normal limits will improve Outcome: Progressing Goal: Will remain free from infection Outcome: Progressing Goal: Diagnostic test results will improve Outcome: Progressing Goal: Cardiovascular complication will be avoided Outcome: Progressing   Problem: Activity: Goal: Risk for activity intolerance will decrease Outcome: Progressing   Problem: Nutrition: Goal: Adequate nutrition will be maintained Outcome: Progressing   Problem: Coping: Goal: Level of anxiety will decrease Outcome: Progressing   Problem: Elimination: Goal: Will not experience complications related to bowel motility Outcome: Progressing   Problem: Fluid Volume: Goal: Hemodynamic stability will improve Outcome: Progressing   Problem: Clinical Measurements: Goal: Diagnostic test results will improve Outcome: Progressing   Problem: Coping: Goal: Ability to adjust to condition or change in health will improve Outcome: Progressing Goal: Ability to identify appropriate support needs will improve Outcome: Progressing   Problem: Health Behavior/Discharge Planning: Goal: Compliance with prescribed medication regimen will improve Outcome: Progressing   Problem: Medication: Goal: Risk for medication side effects will decrease Outcome: Progressing   Problem: Clinical Measurements: Goal: Complications related to the disease process, condition or treatment will be avoided or minimized Outcome: Progressing Goal: Diagnostic test results will improve Outcome: Progressing   Problem: Safety: Goal: Verbalization of understanding the information provided will improve Outcome: Progressing   Problem: Self-Concept: Goal: Level of anxiety will decrease Outcome: Progressing Goal: Ability to verbalize feelings about condition will improve Outcome: Progressing   Problem: Education: Goal: Ability to state activities that reduce  stress will improve Outcome: Progressing   Problem: Coping: Goal: Ability to identify and develop effective coping behavior will improve Outcome: Progressing   Problem: Self-Concept: Goal: Ability to identify factors that promote anxiety will improve Outcome: Progressing Goal: Level of anxiety will decrease Outcome: Progressing Goal: Ability to modify response to factors that promote anxiety will improve Outcome: Progressing   Problem: Health Behavior/Discharge Planning: Goal: Ability to manage health-related needs will improve Outcome: Progressing   Problem: Clinical Measurements: Goal: Complications related to the disease process or treatment will be avoided or minimized Outcome: Progressing Goal: Dialysis access will remain free of complications Outcome: Progressing   Problem: Activity: Goal: Activity intolerance will improve Outcome: Progressing   Problem: Fluid Volume: Goal: Fluid volume balance will be maintained or improved Outcome: Progressing   Problem: Nutritional: Goal: Ability to make appropriate dietary choices will improve Outcome: Progressing   Problem: Respiratory: Goal: Respiratory symptoms related to disease process will be avoided Outcome: Progressing   Problem: Self-Concept: Goal: Body image disturbance will be avoided or minimized Outcome: Progressing   Problem: Urinary Elimination: Goal: Progression of disease will be identified and treated Outcome: Progressing   Problem: Education: Goal: Knowledge of General Education information will improve Description: Including pain rating scale, medication(s)/side effects and non-pharmacologic comfort measures Outcome: Not Progressing   Problem: Health Behavior/Discharge Planning: Goal: Ability to manage health-related needs will improve Outcome: Not Progressing   Problem: Education: Goal: Expressions of having a comfortable level of knowledge regarding the disease process will increase Outcome:  Not Progressing   Problem: Education: Goal: Knowledge of disease and its progression will improve Outcome: Not Progressing

## 2020-05-05 ENCOUNTER — Other Ambulatory Visit: Payer: Self-pay

## 2020-05-05 ENCOUNTER — Inpatient Hospital Stay (HOSPITAL_COMMUNITY)
Admission: RE | Admit: 2020-05-05 | Discharge: 2020-05-12 | DRG: 945 | Disposition: A | Discharge status: Other Acute Inpatient Hospital | Payer: Medicare Other | Source: Intra-hospital | Attending: Physical Medicine & Rehabilitation | Admitting: Physical Medicine & Rehabilitation

## 2020-05-05 ENCOUNTER — Encounter (HOSPITAL_COMMUNITY): Payer: Self-pay | Admitting: Physical Medicine & Rehabilitation

## 2020-05-05 DIAGNOSIS — Z885 Allergy status to narcotic agent status: Secondary | ICD-10-CM | POA: Diagnosis not present

## 2020-05-05 DIAGNOSIS — A419 Sepsis, unspecified organism: Secondary | ICD-10-CM | POA: Diagnosis present

## 2020-05-05 DIAGNOSIS — R5381 Other malaise: Secondary | ICD-10-CM

## 2020-05-05 DIAGNOSIS — Z79899 Other long term (current) drug therapy: Secondary | ICD-10-CM | POA: Diagnosis not present

## 2020-05-05 DIAGNOSIS — N390 Urinary tract infection, site not specified: Secondary | ICD-10-CM | POA: Diagnosis not present

## 2020-05-05 DIAGNOSIS — R053 Chronic cough: Secondary | ICD-10-CM

## 2020-05-05 DIAGNOSIS — G40909 Epilepsy, unspecified, not intractable, without status epilepticus: Secondary | ICD-10-CM | POA: Diagnosis present

## 2020-05-05 DIAGNOSIS — R109 Unspecified abdominal pain: Secondary | ICD-10-CM | POA: Diagnosis not present

## 2020-05-05 DIAGNOSIS — Z833 Family history of diabetes mellitus: Secondary | ICD-10-CM

## 2020-05-05 DIAGNOSIS — A4151 Sepsis due to Escherichia coli [E. coli]: Secondary | ICD-10-CM | POA: Diagnosis not present

## 2020-05-05 DIAGNOSIS — F79 Unspecified intellectual disabilities: Secondary | ICD-10-CM | POA: Diagnosis present

## 2020-05-05 DIAGNOSIS — Z1612 Extended spectrum beta lactamase (ESBL) resistance: Secondary | ICD-10-CM | POA: Diagnosis not present

## 2020-05-05 DIAGNOSIS — R410 Disorientation, unspecified: Secondary | ICD-10-CM | POA: Diagnosis not present

## 2020-05-05 DIAGNOSIS — R1310 Dysphagia, unspecified: Secondary | ICD-10-CM | POA: Diagnosis present

## 2020-05-05 DIAGNOSIS — A499 Bacterial infection, unspecified: Secondary | ICD-10-CM

## 2020-05-05 DIAGNOSIS — R1031 Right lower quadrant pain: Secondary | ICD-10-CM

## 2020-05-05 DIAGNOSIS — R059 Cough, unspecified: Secondary | ICD-10-CM | POA: Diagnosis not present

## 2020-05-05 DIAGNOSIS — D649 Anemia, unspecified: Secondary | ICD-10-CM | POA: Diagnosis present

## 2020-05-05 DIAGNOSIS — Z888 Allergy status to other drugs, medicaments and biological substances status: Secondary | ICD-10-CM | POA: Diagnosis not present

## 2020-05-05 DIAGNOSIS — K219 Gastro-esophageal reflux disease without esophagitis: Secondary | ICD-10-CM | POA: Diagnosis present

## 2020-05-05 DIAGNOSIS — Z83438 Family history of other disorder of lipoprotein metabolism and other lipidemia: Secondary | ICD-10-CM

## 2020-05-05 DIAGNOSIS — R000 Tachycardia, unspecified: Secondary | ICD-10-CM | POA: Diagnosis not present

## 2020-05-05 DIAGNOSIS — N151 Renal and perinephric abscess: Secondary | ICD-10-CM | POA: Diagnosis present

## 2020-05-05 DIAGNOSIS — I5032 Chronic diastolic (congestive) heart failure: Secondary | ICD-10-CM | POA: Diagnosis present

## 2020-05-05 DIAGNOSIS — E860 Dehydration: Secondary | ICD-10-CM | POA: Diagnosis present

## 2020-05-05 DIAGNOSIS — R739 Hyperglycemia, unspecified: Secondary | ICD-10-CM | POA: Diagnosis present

## 2020-05-05 DIAGNOSIS — N179 Acute kidney failure, unspecified: Secondary | ICD-10-CM | POA: Diagnosis not present

## 2020-05-05 DIAGNOSIS — J9811 Atelectasis: Secondary | ICD-10-CM | POA: Diagnosis not present

## 2020-05-05 DIAGNOSIS — T50905A Adverse effect of unspecified drugs, medicaments and biological substances, initial encounter: Secondary | ICD-10-CM | POA: Diagnosis not present

## 2020-05-05 DIAGNOSIS — Z87891 Personal history of nicotine dependence: Secondary | ICD-10-CM | POA: Diagnosis not present

## 2020-05-05 DIAGNOSIS — R7881 Bacteremia: Secondary | ICD-10-CM

## 2020-05-05 DIAGNOSIS — K5900 Constipation, unspecified: Secondary | ICD-10-CM | POA: Diagnosis present

## 2020-05-05 DIAGNOSIS — D638 Anemia in other chronic diseases classified elsewhere: Secondary | ICD-10-CM | POA: Diagnosis present

## 2020-05-05 DIAGNOSIS — D72829 Elevated white blood cell count, unspecified: Secondary | ICD-10-CM | POA: Diagnosis present

## 2020-05-05 DIAGNOSIS — N12 Tubulo-interstitial nephritis, not specified as acute or chronic: Secondary | ICD-10-CM | POA: Diagnosis not present

## 2020-05-05 DIAGNOSIS — G40409 Other generalized epilepsy and epileptic syndromes, not intractable, without status epilepticus: Secondary | ICD-10-CM | POA: Diagnosis present

## 2020-05-05 DIAGNOSIS — R6520 Severe sepsis without septic shock: Secondary | ICD-10-CM | POA: Diagnosis not present

## 2020-05-05 DIAGNOSIS — R509 Fever, unspecified: Secondary | ICD-10-CM | POA: Diagnosis present

## 2020-05-05 HISTORY — DX: Other malaise: R53.81

## 2020-05-05 LAB — CREATININE, SERUM
Creatinine, Ser: 0.98 mg/dL (ref 0.44–1.00)
GFR, Estimated: >60 mL/min (ref >60)

## 2020-05-05 LAB — GLUCOSE, CAPILLARY
Glucose-Capillary: 101 mg/dL — ABNORMAL HIGH (ref 70–99)
Glucose-Capillary: 107 mg/dL — ABNORMAL HIGH (ref 70–99)
Glucose-Capillary: 108 mg/dL — ABNORMAL HIGH (ref 70–99)
Glucose-Capillary: 123 mg/dL — ABNORMAL HIGH (ref 70–99)
Glucose-Capillary: 86 mg/dL (ref 70–99)
Glucose-Capillary: 94 mg/dL (ref 70–99)

## 2020-05-05 LAB — PROCALCITONIN: Procalcitonin: <0.1 ng/mL

## 2020-05-05 MED ORDER — METOPROLOL TARTRATE 25 MG PO TABS
25.0000 mg | ORAL_TABLET | Freq: Two times a day (BID) | ORAL | Status: DC
  Administered 2020-05-05 – 2020-05-12 (×13): 25 mg via ORAL
  Filled 2020-05-05 (×14): qty 1

## 2020-05-05 MED ORDER — ACETAMINOPHEN 325 MG PO TABS
325.0000 mg | ORAL_TABLET | ORAL | Status: DC | PRN
  Administered 2020-05-06 – 2020-05-12 (×5): 650 mg via ORAL
  Filled 2020-05-05 (×9): qty 2

## 2020-05-05 MED ORDER — CHLORHEXIDINE GLUCONATE 0.12 % MT SOLN
15.0000 mL | Freq: Two times a day (BID) | OROMUCOSAL | Status: DC
  Administered 2020-05-05 – 2020-05-11 (×10): 15 mL via OROMUCOSAL
  Filled 2020-05-05 (×11): qty 15

## 2020-05-05 MED ORDER — FLEET ENEMA 7-19 GM/118ML RE ENEM: 1.0000 | ENEMA | Freq: Once | RECTAL | Status: DC | PRN

## 2020-05-05 MED ORDER — ADULT MULTIVITAMIN W/MINERALS CH
1.0000 | ORAL_TABLET | Freq: Every day | ORAL | Status: DC
  Administered 2020-05-06 – 2020-05-10 (×5): 1 via ORAL
  Filled 2020-05-05 (×7): qty 1

## 2020-05-05 MED ORDER — ATORVASTATIN CALCIUM 10 MG PO TABS
20.0000 mg | ORAL_TABLET | Freq: Every evening | ORAL | Status: DC
  Administered 2020-05-05 – 2020-05-10 (×6): 20 mg via ORAL
  Filled 2020-05-05 (×7): qty 2

## 2020-05-05 MED ORDER — ENSURE ENLIVE PO LIQD
237.0000 mL | Freq: Three times a day (TID) | ORAL | 12 refills | Status: DC
Start: 2020-05-05 — End: 2020-12-12

## 2020-05-05 MED ORDER — ORAL CARE MOUTH RINSE
15.0000 mL | Freq: Two times a day (BID) | OROMUCOSAL | Status: DC
  Administered 2020-05-06 – 2020-05-12 (×13): 15 mL via OROMUCOSAL

## 2020-05-05 MED ORDER — GUAIFENESIN-DM 100-10 MG/5ML PO SYRP
5.0000 mL | ORAL_SOLUTION | Freq: Four times a day (QID) | ORAL | Status: DC | PRN
  Administered 2020-05-05 – 2020-05-12 (×17): 10 mL via ORAL
  Filled 2020-05-05 (×18): qty 10

## 2020-05-05 MED ORDER — ACETAMINOPHEN 500 MG PO TABS: 500.0000 mg | ORAL_TABLET | Freq: Four times a day (QID) | ORAL | Status: DC | PRN

## 2020-05-05 MED ORDER — DOCUSATE SODIUM 100 MG PO CAPS
100.0000 mg | ORAL_CAPSULE | Freq: Two times a day (BID) | ORAL | Status: DC
  Administered 2020-05-05 – 2020-05-07 (×3): 100 mg via ORAL
  Filled 2020-05-05 (×4): qty 1

## 2020-05-05 MED ORDER — PANTOPRAZOLE SODIUM 40 MG PO TBEC
40.0000 mg | DELAYED_RELEASE_TABLET | Freq: Every day | ORAL | Status: DC
  Administered 2020-05-06 – 2020-05-10 (×5): 40 mg via ORAL
  Filled 2020-05-05 (×6): qty 1

## 2020-05-05 MED ORDER — DIAZEPAM 2 MG PO TABS
2.0000 mg | ORAL_TABLET | Freq: Every day | ORAL | Status: DC
  Administered 2020-05-05 – 2020-05-10 (×6): 2 mg via ORAL
  Filled 2020-05-05 (×6): qty 1

## 2020-05-05 MED ORDER — DOCUSATE SODIUM 100 MG PO CAPS
100.0000 mg | ORAL_CAPSULE | Freq: Two times a day (BID) | ORAL | 0 refills | Status: DC
Start: 2020-05-05 — End: 2020-12-12

## 2020-05-05 MED ORDER — BISACODYL 10 MG RE SUPP: 10.0000 mg | Freq: Every day | RECTAL | Status: DC | PRN

## 2020-05-05 MED ORDER — PHENOL 1.4 % MT LIQD
1.0000 | Freq: Four times a day (QID) | OROMUCOSAL | Status: DC | PRN
  Administered 2020-05-10 – 2020-05-12 (×2): 1 via OROMUCOSAL

## 2020-05-05 MED ORDER — DIPHENHYDRAMINE HCL 12.5 MG/5ML PO ELIX: 12.5000 mg | ORAL_SOLUTION | Freq: Four times a day (QID) | ORAL | Status: DC | PRN

## 2020-05-05 MED ORDER — ENOXAPARIN SODIUM 40 MG/0.4ML ~~LOC~~ SOLN
40.0000 mg | SUBCUTANEOUS | Status: DC
  Administered 2020-05-05 – 2020-05-09 (×5): 40 mg via SUBCUTANEOUS
  Filled 2020-05-05 (×7): qty 0.4

## 2020-05-05 MED ORDER — PROCHLORPERAZINE EDISYLATE 10 MG/2ML IJ SOLN: 5.0000 mg | Freq: Four times a day (QID) | INTRAMUSCULAR | Status: DC | PRN

## 2020-05-05 MED ORDER — BISACODYL 10 MG RE SUPP
10.0000 mg | Freq: Every day | RECTAL | Status: DC
  Administered 2020-05-05 – 2020-05-07 (×3): 10 mg via RECTAL
  Filled 2020-05-05 (×9): qty 1

## 2020-05-05 MED ORDER — VALPROIC ACID 250 MG/5ML PO SOLN: 500.0000 mg | Freq: Two times a day (BID) | ORAL | Status: DC

## 2020-05-05 MED ORDER — QUETIAPINE FUMARATE 50 MG PO TABS
150.0000 mg | ORAL_TABLET | Freq: Every day | ORAL | Status: DC
  Administered 2020-05-05: 150 mg via ORAL
  Filled 2020-05-05: qty 3

## 2020-05-05 MED ORDER — POLYETHYLENE GLYCOL 3350 17 G PO PACK
17.0000 g | PACK | Freq: Every day | ORAL | Status: DC
  Administered 2020-05-06 – 2020-05-07 (×2): 17 g via ORAL
  Filled 2020-05-05 (×2): qty 1

## 2020-05-05 MED ORDER — ENOXAPARIN SODIUM 40 MG/0.4ML ~~LOC~~ SOLN: 40.0000 mg | SUBCUTANEOUS | Status: DC

## 2020-05-05 MED ORDER — QUETIAPINE FUMARATE 50 MG PO TABS
75.0000 mg | ORAL_TABLET | Freq: Every day | ORAL | Status: DC
  Administered 2020-05-06: 75 mg via ORAL
  Filled 2020-05-05: qty 1

## 2020-05-05 MED ORDER — LIP MEDEX EX OINT: TOPICAL_OINTMENT | CUTANEOUS | Status: DC | PRN

## 2020-05-05 MED ORDER — POLYETHYLENE GLYCOL 3350 17 G PO PACK: 17.0000 g | PACK | Freq: Every day | ORAL | Status: DC | PRN

## 2020-05-05 MED ORDER — QUETIAPINE FUMARATE 25 MG PO TABS
75.0000 mg | ORAL_TABLET | Freq: Every day | ORAL | Status: DC
Start: 2020-05-05 — End: 2020-05-18

## 2020-05-05 MED ORDER — PROCHLORPERAZINE 25 MG RE SUPP: 12.5000 mg | Freq: Four times a day (QID) | RECTAL | Status: DC | PRN

## 2020-05-05 MED ORDER — ACETAMINOPHEN 500 MG PO TABS
500.0000 mg | ORAL_TABLET | Freq: Four times a day (QID) | ORAL | 0 refills | Status: DC | PRN
Start: 2020-05-05 — End: 2021-09-04

## 2020-05-05 MED ORDER — IPRATROPIUM-ALBUTEROL 0.5-2.5 (3) MG/3ML IN SOLN
3.0000 mL | RESPIRATORY_TRACT | Status: DC | PRN
Start: 2020-05-05 — End: 2020-06-01

## 2020-05-05 MED ORDER — LACOSAMIDE 50 MG PO TABS
200.0000 mg | ORAL_TABLET | Freq: Two times a day (BID) | ORAL | Status: DC
  Administered 2020-05-05 – 2020-05-12 (×14): 200 mg via ORAL
  Filled 2020-05-05 (×14): qty 4

## 2020-05-05 MED ORDER — DIAZEPAM 2 MG PO TABS
2.0000 mg | ORAL_TABLET | Freq: Every day | ORAL | 0 refills | Status: DC
Start: 2020-05-05 — End: 2020-06-01

## 2020-05-05 MED ORDER — CLONAZEPAM 1 MG PO TABS
1.0000 mg | ORAL_TABLET | Freq: Three times a day (TID) | ORAL | 0 refills | Status: DC
Start: 2020-05-05 — End: 2020-06-01

## 2020-05-05 MED ORDER — LORAZEPAM 2 MG/ML IJ SOLN: 2.0000 mg | INTRAMUSCULAR | Status: DC | PRN

## 2020-05-05 MED ORDER — ALUM & MAG HYDROXIDE-SIMETH 200-200-20 MG/5ML PO SUSP
30.0000 mL | ORAL | Status: DC | PRN
  Administered 2020-05-11: 30 mL via ORAL
  Filled 2020-05-05: qty 30

## 2020-05-05 MED ORDER — DIVALPROEX SODIUM ER 500 MG PO TB24
500.0000 mg | ORAL_TABLET | Freq: Two times a day (BID) | ORAL | Status: DC
  Administered 2020-05-05 – 2020-05-12 (×13): 500 mg via ORAL
  Filled 2020-05-05 (×16): qty 1

## 2020-05-05 MED ORDER — QUETIAPINE FUMARATE 50 MG PO TABS
150.0000 mg | ORAL_TABLET | Freq: Every day | ORAL | Status: DC
Start: 2020-05-05 — End: 2020-05-18

## 2020-05-05 MED ORDER — CLONAZEPAM 0.5 MG PO TABS
1.0000 mg | ORAL_TABLET | Freq: Three times a day (TID) | ORAL | Status: DC
  Administered 2020-05-05 – 2020-05-07 (×5): 1 mg via ORAL
  Filled 2020-05-05 (×5): qty 2

## 2020-05-05 MED ORDER — ENSURE ENLIVE PO LIQD
237.0000 mL | Freq: Three times a day (TID) | ORAL | Status: DC
  Administered 2020-05-05 – 2020-05-11 (×7): 237 mL via ORAL
  Filled 2020-05-05: qty 237

## 2020-05-05 MED ORDER — METOPROLOL TARTRATE 25 MG PO TABS
25.0000 mg | ORAL_TABLET | Freq: Two times a day (BID) | ORAL | Status: DC
Start: 2020-05-05 — End: 2020-05-18

## 2020-05-05 MED ORDER — IPRATROPIUM-ALBUTEROL 0.5-2.5 (3) MG/3ML IN SOLN
3.0000 mL | RESPIRATORY_TRACT | Status: DC | PRN
  Administered 2020-05-11: 3 mL via RESPIRATORY_TRACT
  Filled 2020-05-05 (×2): qty 3

## 2020-05-05 MED ORDER — PROCHLORPERAZINE MALEATE 5 MG PO TABS
5.0000 mg | ORAL_TABLET | Freq: Four times a day (QID) | ORAL | Status: DC | PRN
  Administered 2020-05-10 (×2): 10 mg via ORAL
  Filled 2020-05-05 (×2): qty 2

## 2020-05-05 MED ORDER — MELATONIN 3 MG PO TABS
3.0000 mg | ORAL_TABLET | Freq: Every evening | ORAL | Status: DC | PRN
  Filled 2020-05-05: qty 1

## 2020-05-05 NOTE — Care Management Important Message (Signed)
Important Message  Patient Details  Name: Maria [name] MRN: [id number] Date of Birth: 1960-08-09   Medicare Important Message Given:  Yes - Important Message mailed due to current National Emergency  Verbal consent obtained due to current National Emergency  Relationship to patient: Self Contact Name: Albena Comes Call Date: 05/05/20  Time: 1506 Phone: [phone] Outcome: No Answer/Busy Important Message mailed to: Patient address on file    Delorse Lek 05/05/2020, 3:06 PM

## 2020-05-05 NOTE — H&P (Signed)
Physical Medicine and Rehabilitation Admission H&P    Chief Complaint  Patient presents with  . debility    HPI: Maria [name] is a 59 year old female with history of seizure disorder, intellectual delay, esophageal stricture, tremors who was originally admitted on 04/14/20 with fevers, decrease intake X 3 days and mental status changes due to septic shock from pyelonephritis due to E coli UTI. She was treated with antibiotics and hospital course significant for break through seizures as well as delirium with agitation as well as hyperammonemia. Cortak placed for nutritional support and she required pressors for BP support as well as Precedex -->sitter due to agitation. Dr. Dalene Carrow consulted for input on seizures and she was loaded with Keppra. Neurology recommends continuing Valproic acid and depakene as felt to have baseline brief ictal-interictal discharges and no increase in AEDs unless patient has clinical seizures.  ST following for input on dysphagia and as mentation/lethargy improved, was started on dysphagia 1, thins on 11/29. MBS done 11/30 due to hypoxia with concerns of aspiration event. This revealed functional swallow without aspiration and she was advanced to dysphagia 3, thins. Therapy ongoing and patient noted to be debilitated. She continues to have balance deficits with tachycardia with activity as well as delayed processing with cognitive deficits affecting ADL/mobility. CIR recommended due to functional decline.    Review of Systems  Constitutional: Negative for chills and fever.  HENT: Negative for hearing loss.   Eyes: Negative for blurred vision.  Respiratory: Negative for cough and shortness of breath.   Cardiovascular: Negative for chest pain.  Gastrointestinal: Negative for abdominal pain, heartburn and nausea.       Appetite has been poor but drinking well per RN  Musculoskeletal: Negative for myalgias.  Skin: Negative for rash.  Neurological: Positive for  headaches. Negative for dizziness.  Psychiatric/Behavioral: The patient has insomnia (has not been sleeping at nights).      Past Medical History:  Diagnosis Date  . Constipation   . Dysphagia   . Fracture    R foot  . GERD (gastroesophageal reflux disease)   . History of shingles 10/2016  . Mental retardation    lesion in head  . Seizures ([place])    "not fully controlled on max doses of meds" (Neuro ofc note 12/2014)  . Thrombocytopenia ([place])   . Tremor     Past Surgical History:  Procedure Laterality Date  . BIOPSY  09/16/2014   Procedure: BIOPSY;  Surgeon: Rogene Houston, MD;  Location: [facility];  Service: Endoscopy;;  . CATARACT EXTRACTION     both eyes, May of 2015  . COLONOSCOPY    . COLONOSCOPY WITH PROPOFOL N/A 11/20/2018   Procedure: COLONOSCOPY WITH PROPOFOL;  Surgeon: Rogene Houston, MD;  Location: [facility];  Service: Endoscopy;  Laterality: N/A;  . ESOPHAGEAL DILATION N/A 09/16/2014   Procedure: ESOPHAGEAL DILATION WITH 54FR MALONEY DILATOR;  Surgeon: Rogene Houston, MD;  Location: [facility];  Service: Endoscopy;  Laterality: N/A;  . ESOPHAGOGASTRODUODENOSCOPY (EGD) WITH PROPOFOL N/A 09/16/2014   Procedure: ESOPHAGOGASTRODUODENOSCOPY (EGD) WITH PROPOFOL;  Surgeon: Rogene Houston, MD;  Location: [facility];  Service: Endoscopy;  Laterality: N/A;  . MOUTH SURGERY    . POLYPECTOMY  11/20/2018   Procedure: POLYPECTOMY;  Surgeon: Rogene Houston, MD;  Location: [facility];  Service: Endoscopy;;  colon  . Skin graft to gum Right 08/2013  . TONSILLECTOMY AND ADENOIDECTOMY    . TOTAL ABDOMINAL HYSTERECTOMY  Family History  Problem Relation Age of Onset  . High Cholesterol Mother   . High blood pressure Mother   . Diabetes Father     Social History:  Per reports she quit smoking about 13 years ago. Her smoking use included cigarettes. She has a 22.50 pack-year smoking history. She has never used smokeless tobacco. She reports that she does not drink alcohol and does  not use drugs.   Allergies  Allergen Reactions  . Codeine Nausea And Vomiting  . Lamictal [Lamotrigine] Rash    Medications Prior to Admission  Medication Sig Dispense Refill  . atorvastatin (LIPITOR) 20 MG tablet Take 20 mg by mouth daily.    . clonazePAM (KLONOPIN) 0.5 MG tablet Take 1 tablet (0.5 mg total) by mouth 2 (two) times daily. 180 tablet 1  . divalproex (DEPAKOTE ER) 500 MG 24 hr tablet Take 1 tablet (500 mg total) by mouth 2 (two) times daily. 180 tablet 3  . ibuprofen (ADVIL) 200 MG tablet Take 400 mg by mouth every 6 (six) hours as needed for moderate pain.    Marland Kitchen lacosamide (VIMPAT) 200 MG TABS tablet Take 1 tablet (200 mg total) by mouth 2 (two) times daily. 180 tablet 1  . levocetirizine (XYZAL) 5 MG tablet Take 5 mg by mouth every evening.     Marland Kitchen omeprazole (PRILOSEC) 40 MG capsule Take 1 capsule (40 mg total) by mouth daily. 90 capsule 3  . oxybutynin (DITROPAN-XL) 5 MG 24 hr tablet Take 5 mg by mouth at bedtime.     . Pediatric Multiple Vit-C-FA (PEDIATRIC MULTIVITAMIN) chewable tablet Chew 1 tablet by mouth daily.        Drug Regimen Review  Drug regimen was reviewed and remains appropriate with no significant issues identified  Home: Home Living Family/patient expects to be discharged to:: Private residence Living Arrangements: Parent Available Help at Discharge: Family, Available 24 hours/day Type of Home: House Home Access: Stairs to enter Technical brewer of Steps: 1 Home Layout: Two level, Laundry or work area in basement, Able to live on main level with bedroom/bathroom Bathroom Shower/Tub: Chiropodist: Standard Bathroom Accessibility: Yes Home Equipment: Environmental consultant - 2 wheels, Shower seat, Bedside commode, Wheelchair - manual  Lives With:  (parent/Mom)   Functional History: Prior Function Level of Independence: Needs assistance Gait / Transfers Assistance Needed: walks without RW ADL's / Homemaking Assistance Needed: performs  ADLs on her own Comments: supervision for safety, cognition, medication  Functional Status:  Mobility: Bed Mobility Overal bed mobility: Needs Assistance Bed Mobility: Supine to Sit, Sit to Supine Rolling: Mod assist Supine to sit: Mod assist, HOB elevated Sit to supine: Min assist General bed mobility comments: Pt needed mod assist to support trunk and progress bil LEs to EOB, pt kept putting them back into bed.  Min assist at trunk to return to supine, pt able to lift both of her legs back.  Transfers Overall transfer level: Needs assistance Equipment used: 2 person hand held assist, Rolling walker (2 wheeled) Transfers: Sit to/from Stand, Stand Pivot Transfers Sit to Stand: Mod assist, +2 physical assistance, Min assist Stand pivot transfers: +2 physical assistance, Mod assist General transfer comment: Pt stood EOB with two person hand held assist  with  to [facility] after reporting the need to have to urinate.  Total assist for peri care and still mod assist of one to transfer back to bed with RW, uncontrolled premature sit to bed. Ambulation/Gait General Gait Details: I think pt could be  ready for short distance gait with RW and chair to follow very closely.   ADL: ADL Overall ADL's : Needs assistance/impaired Grooming: Wash/dry face, Min guard, Sitting, Wash/dry hands Upper Body Bathing: Minimal assistance, Sitting Upper Body Bathing Details (indicate cue type and reason): simulated seated EOB Upper Body Dressing : Minimal assistance Upper Body Dressing Details (indicate cue type and reason): donned clean gown seated EOB Toilet Transfer: Moderate assistance, +2 for physical assistance, Control and instrumentation engineer Details (indicate cue type and reason): bed>recliner on her right Toileting- Clothing Manipulation and Hygiene: Total assistance General ADL Comments: Pt is currently totalA for all ADL, follow commands intermittently. provided hand over hand assistance for pt to wash her  face, no noted active participation  Cognition: Cognition Overall Cognitive Status: Impaired/Different from baseline Orientation Level: Oriented to person, Oriented to place, Disoriented to time, Disoriented to situation Cognition Arousal/Alertness: Awake/alert Behavior During Therapy: Flat affect, Impulsive Overall Cognitive Status: Impaired/Different from baseline Area of Impairment: Attention, Following commands, Safety/judgement, Awareness, Problem solving Orientation Level:  (knows her sister) Current Attention Level: Sustained Following Commands: Follows one step commands with increased time, Follows one step commands inconsistently Safety/Judgement: Decreased awareness of safety, Decreased awareness of deficits Awareness: Intellectual Problem Solving: Slow processing, Difficulty sequencing, Requires verbal cues, Requires tactile cues, Decreased initiation General Comments: Pt following basic commands at times with delay, needs multimodal cues to initiate movement to EOB.  Sustained attention in busy room, but often distracted by sister and RN who were also talking/in room working.  Verbalized need to use the bathroom appropriately once EOB.    Blood pressure 102/66, pulse 69, temperature 99.1 F (37.3 C), temperature source Oral, resp. rate 18, height 5\' 6"  (1.676 m), weight 61.3 kg, SpO2 100 %. Physical Exam  General: Alert, No apparent distress HEENT: Head is normocephalic, atraumatic, PERRLA, EOMI, sclera anicteric, oral mucosa pink and moist, dentition intact, ext ear canals clear,  Neck: Supple without JVD or lymphadenopathy Heart: Tachycardic. No murmurs rubs or gallops Chest: CTA bilaterally without wheezes, rales, or rhonchi; no distress Abdomen: Soft, non-tender, non-distended, bowel sounds positive. Extremities: No clubbing, cyanosis, or edema. Pulses are 2+ Skin: Clean and intact without signs of breakdown Neuro: Kept eyes closed for most of exam--reporting fatigue.  Childlike with staccato speech at times. She was oriented to place, situation, DOB and age without difficulty, she did not want to participate in MMT--doesn't like to "push"anyone away but moves all four with tactile and verbal coaxing cues.  Psych: Pt's affect is appropriate. Pt is cooperative   Results for orders placed or performed during the hospital encounter of 04/14/20 (from the past 48 hour(s))  Glucose, capillary     Status: None   Collection Time: 05/03/20  8:23 PM  Result Value Ref Range   Glucose-Capillary 98 70 - 99 mg/dL    Comment: Glucose reference range applies only to samples taken after fasting for at least 8 hours.  Glucose, capillary     Status: Abnormal   Collection Time: 05/04/20 12:07 AM  Result Value Ref Range   Glucose-Capillary 111 (H) 70 - 99 mg/dL    Comment: Glucose reference range applies only to samples taken after fasting for at least 8 hours.  Glucose, capillary     Status: None   Collection Time: 05/04/20  4:11 AM  Result Value Ref Range   Glucose-Capillary 91 70 - 99 mg/dL    Comment: Glucose reference range applies only to samples taken after fasting for at least 8 hours.  Glucose, capillary     Status: None   Collection Time: 05/04/20  7:23 AM  Result Value Ref Range   Glucose-Capillary 96 70 - 99 mg/dL    Comment: Glucose reference range applies only to samples taken after fasting for at least 8 hours.  Glucose, capillary     Status: Abnormal   Collection Time: 05/04/20 12:07 PM  Result Value Ref Range   Glucose-Capillary 121 (H) 70 - 99 mg/dL    Comment: Glucose reference range applies only to samples taken after fasting for at least 8 hours.  CBC     Status: Abnormal   Collection Time: 05/04/20  3:28 PM  Result Value Ref Range   WBC 12.1 (H) 4.0 - 10.5 K/uL   RBC 2.83 (L) 3.87 - 5.11 MIL/uL   Hemoglobin 8.6 (L) 12.0 - 15.0 g/dL   HCT 28.1 (L) 36 - 46 %   MCV 99.3 80.0 - 100.0 fL   MCH 30.4 26.0 - 34.0 pg   MCHC 30.6 30.0 - 36.0 g/dL    RDW 15.1 11.5 - 15.5 %   Platelets 414 (H) 150 - 400 K/uL   nRBC 0.0 0.0 - 0.2 %    Comment: Performed at [facility] [address] [place], [place] [address]  Basic metabolic panel     Status: Abnormal   Collection Time: 05/04/20  3:28 PM  Result Value Ref Range   Sodium 138 135 - 145 mmol/L   Potassium 4.2 3.5 - 5.1 mmol/L   Chloride 96 (L) 98 - 111 mmol/L   CO2 26 22 - 32 mmol/L   Glucose, Bld 114 (H) 70 - 99 mg/dL    Comment: Glucose reference range applies only to samples taken after fasting for at least 8 hours.   BUN 21 (H) 6 - 20 mg/dL   Creatinine, Ser 0.99 0.44 - 1.00 mg/dL   Calcium 9.8 8.9 - 10.3 mg/dL   GFR, Estimated >60 >60 mL/min    Comment: (NOTE) Calculated using the CKD-EPI Creatinine Equation (2021)    Anion gap 16 (H) 5 - 15    Comment: Performed at [facility] [address], [place], [place] [address]  Procalcitonin - Baseline     Status: None   Collection Time: 05/04/20  3:28 PM  Result Value Ref Range   Procalcitonin <0.10 ng/mL    Comment:        Interpretation: PCT (Procalcitonin) <= 0.5 ng/mL: Systemic infection (sepsis) is not likely. Local bacterial infection is possible. (NOTE)       Sepsis PCT Algorithm           Lower Respiratory Tract                                      Infection PCT Algorithm    ----------------------------     ----------------------------         PCT < 0.25 ng/mL                PCT < 0.10 ng/mL          Strongly encourage             Strongly discourage   discontinuation of antibiotics    initiation of antibiotics    ----------------------------     -----------------------------       PCT 0.25 - 0.50 ng/mL  PCT 0.10 - 0.25 ng/mL               OR       >80% decrease in PCT            Discourage initiation of                                            antibiotics      Encourage discontinuation           of antibiotics    ----------------------------     -----------------------------          PCT >= 0.50 ng/mL              PCT 0.26 - 0.50 ng/mL               AND        <80% decrease in PCT             Encourage initiation of                                             antibiotics       Encourage continuation           of antibiotics    ----------------------------     -----------------------------        PCT >= 0.50 ng/mL                  PCT > 0.50 ng/mL               AND         increase in PCT                  Strongly encourage                                      initiation of antibiotics    Strongly encourage escalation           of antibiotics                                     -----------------------------                                           PCT <= 0.25 ng/mL                                                 OR                                        > 80% decrease in PCT  Discontinue / Do not initiate                                             antibiotics  Performed at [facility], [place] [address] [place], [place] [address]   Glucose, capillary     Status: Abnormal   Collection Time: 05/04/20  3:49 PM  Result Value Ref Range   Glucose-Capillary 125 (H) 70 - 99 mg/dL    Comment: Glucose reference range applies only to samples taken after fasting for at least 8 hours.  Glucose, capillary     Status: Abnormal   Collection Time: 05/04/20  8:08 PM  Result Value Ref Range   Glucose-Capillary 100 (H) 70 - 99 mg/dL    Comment: Glucose reference range applies only to samples taken after fasting for at least 8 hours.  Glucose, capillary     Status: Abnormal   Collection Time: 05/05/20 12:09 AM  Result Value Ref Range   Glucose-Capillary 107 (H) 70 - 99 mg/dL    Comment: Glucose reference range applies only to samples taken after fasting for at least 8 hours.  Creatinine, serum     Status: None   Collection Time: 05/05/20  4:19 AM  Result Value Ref Range   Creatinine, Ser 0.98 0.44 - 1.00 mg/dL   GFR, Estimated >60  >60 mL/min    Comment: (NOTE) Calculated using the CKD-EPI Creatinine Equation (2021) Performed at [facility] [address], [place], [place] [address]   Procalcitonin     Status: None   Collection Time: 05/05/20  4:19 AM  Result Value Ref Range   Procalcitonin <0.10 ng/mL    Comment:        Interpretation: PCT (Procalcitonin) <= 0.5 ng/mL: Systemic infection (sepsis) is not likely. Local bacterial infection is possible. (NOTE)       Sepsis PCT Algorithm           Lower Respiratory Tract                                      Infection PCT Algorithm    ----------------------------     ----------------------------         PCT < 0.25 ng/mL                PCT < 0.10 ng/mL          Strongly encourage             Strongly discourage   discontinuation of antibiotics    initiation of antibiotics    ----------------------------     -----------------------------       PCT 0.25 - 0.50 ng/mL            PCT 0.10 - 0.25 ng/mL               OR       >80% decrease in PCT            Discourage initiation of                                            antibiotics      Encourage discontinuation  of antibiotics    ----------------------------     -----------------------------         PCT >= 0.50 ng/mL              PCT 0.26 - 0.50 ng/mL               AND        <80% decrease in PCT             Encourage initiation of                                             antibiotics       Encourage continuation           of antibiotics    ----------------------------     -----------------------------        PCT >= 0.50 ng/mL                  PCT > 0.50 ng/mL               AND         increase in PCT                  Strongly encourage                                      initiation of antibiotics    Strongly encourage escalation           of antibiotics                                     -----------------------------                                           PCT <= 0.25 ng/mL                                                  OR                                        > 80% decrease in PCT                                      Discontinue / Do not initiate                                             antibiotics  Performed at [facility], [address] [place], [place] [address]   Glucose, capillary     Status: None   Collection Time: 05/05/20  4:22 AM  Result Value Ref Range   Glucose-Capillary 94 70 - 99 mg/dL  Comment: Glucose reference range applies only to samples taken after fasting for at least 8 hours.  Glucose, capillary     Status: None   Collection Time: 05/05/20  7:31 AM  Result Value Ref Range   Glucose-Capillary 86 70 - 99 mg/dL    Comment: Glucose reference range applies only to samples taken after fasting for at least 8 hours.  Glucose, capillary     Status: Abnormal   Collection Time: 05/05/20 12:15 PM  Result Value Ref Range   Glucose-Capillary 123 (H) 70 - 99 mg/dL    Comment: Glucose reference range applies only to samples taken after fasting for at least 8 hours.  Glucose, capillary     Status: Abnormal   Collection Time: 05/05/20  4:07 PM  Result Value Ref Range   Glucose-Capillary 101 (H) 70 - 99 mg/dL    Comment: Glucose reference range applies only to samples taken after fasting for at least 8 hours.   No results found.     Medical Problem List and Plan: 1.  Impaired mobility and ADLs secondary to left kidney pyelonephritis  -patient may shower  -ELOS/Goals: minA 2-3 weeks 2.  Antithrombotics: -DVT/anticoagulation:  Pharmaceutical: Lovenox  -antiplatelet therapy: N/A 3. Headaches/Pain Management: tylenol prn 4. Mood: LCSW to follow for evaluation and support.   -antipsychotic agents: N/A 5. Neuropsych: This patient is not fully capable of making decisions on her own behalf. 6. Skin/Wound Care: Routine pressure relief measures.  7. Fluids/Electrolytes/Nutrition: Monitor I/O. Follow up labs Monday.  8. Seizure disorder:  Continue Vimpat 200 mg bid and Depakene 500 mg bid.  --will order sleep wake chart to monitor sleep pattern.  --question sedative effects of klonopin/Seroquel (was added 11/26) .  --Recheck ammonia levels as on Valproic acid. Was 76-->46-->43 9. Dehydration: Improving. Encourage fluid intake --offer between meals. 10. Leucocytosis: Resolving.   --Recheck CBC on Monday 11. Anemia: Due to critical illness? H/H has been in 8-9 range. 8.6 on 12/3. Will order anemia panel 12. Tachycardia: Monitor HR tid--on metoprolol bid. Currently better controlled. 13. Delirium: On Seroquel 75 mg am/150 mg HS in addition to Klonopin 1 mg tid.  14. Stress induced hyperglycemia: Hgb A1C-5.7. CBGs 86-123 on 12/3 15. Constipation: On multiple laxatives including daily suppository.   I have personally performed a face to face diagnostic evaluation, including, but not limited to relevant history and physical exam findings, of this patient and developed relevant assessment and plan.  Additionally, I have reviewed and concur with the physician assistant's documentation above.  Leeroy Cha, MD  Reesa Chew, PA-C

## 2020-05-05 NOTE — Discharge Instructions (Signed)
Management per CIR 

## 2020-05-05 NOTE — Progress Notes (Signed)
Inpatient Rehabilitation Admissions Coordinator  CIR bed is available for patient admit today. I received approval from Dr. Waldron Labs and then spoke with sister, [name], by phone and patient at bedside. Acute team and TOC made aware. I will make the arrangements to admit today.  Danne Baxter, RN, MSN Rehab Admissions Coordinator [phone] 05/05/2020 10:13 AM

## 2020-05-05 NOTE — Progress Notes (Signed)
Izora Ribas, MD  Physician  Physical Medicine and Rehabilitation  PMR Pre-admission     Signed  Date of Service:  05/05/2020 10:39 AM      Related encounter: ED to Hosp-Admission (Current) from 04/14/2020 in [facility]      Signed       Show:Clear all [x] Manual[x] Template[] Copied  Added by: [x] Cristina Gong, RN[x] Raulkar, Clide Deutscher, MD  [] Hover for details PMR Admission Coordinator Pre-Admission Assessment   Patient: Maria [name] is an 59 y.o., female MRN: [id number] DOB: 11/08/1960 Height: 5' 6"  (167.6 cm) Weight: 61.8 kg   Insurance Information HMO:     PPO:      PCP:      IPA:      80/20:      OTHER:  PRIMARY: Medicare a and b      Policy#: [id number]      Subscriber: pt Benefits:  Phone #: passport one online     Name: 05/02/3020 Eff. Date: 06/03/1994     Deduct: $1484      Out of Pocket Max: none      Life Max: none CIR: 100%      SNF: 20 days Outpatient: 80%     Co-Pay: 20% Home Health: 100%      Co-Pay: none DME: 80%     Co-Pay: 20% Providers: pt choice  SECONDARY: Tricare for Life      Policy#: [id number]   Financial Counselor:       Phone#:    The "Data Collection Information Summary" for patients in Inpatient Rehabilitation Facilities with attached "Privacy Act [place] Records" was provided and verbally reviewed with: Family   Emergency Contact Information         Contact Information     Name Relation Home Work Mobile    [place] Mother [phone]   [phone]    [facility] Sister [phone]        [place], [place]     [phone]         Current Medical History  Patient Admitting Diagnosis: Debility due to pyelonephritis   History of Present Illness: 59 year old female with medical history significant for constipation, epilepsy, intellectual disability, GERD, dysphagia, tremors who presented 04/14/2020 with issues with left lower quadrant abdominal pain for several weeks.  Diagnosed with vaginal infection and completed course of metronidazole. Poor appetite and poor oral intake with fever and decline in mentation. Transferred from [facility] to [facility]  ICU on 04/16/2020 with septic shock secondary to E. Coli UTI/left pyelonephritis complicated by break through seizures.    Patient weaned from vasopressors and completed antibiotic course for E coli/pyelonephritis septic shock. Patient with significant ICU delirium requiring Precedex drip and has now been transitioned to Seroquel. Continues with waxing and waning mentation and dysphagia.     Developed seizure activity, etiology felt provoked by infectious process. Neurology consulted and began on home antiepileptics. Monitored on EEG with no further seizure activity appreciated. Seizure precautions.    Follows with GI as outpatient Dr. Laural Golden. Has required in the past several esophageal dilations for strictures in the past. SLP followed and now on Dysphagia 3 diet after MBS 05/02/20.   Patient's medical record from [facility] and [facility]  has been reviewed by the rehabilitation admission coordinator and physician.   Past Medical History      Past Medical History:  Diagnosis Date  . Constipation    . Dysphagia    .  Fracture      R foot  . GERD (gastroesophageal reflux disease)    . History of shingles 10/2016  . Mental retardation      lesion in head  . Seizures ([place])      "not fully controlled on max doses of meds" (Neuro ofc note 12/2014)  . Thrombocytopenia ([place])    . Tremor        Family History   family history includes Diabetes in her father; High Cholesterol in her mother; High blood pressure in her mother.   Prior Rehab/Hospitalizations Has the patient had prior rehab or hospitalizations prior to admission? Yes   Has the patient had major surgery during 100 days prior to admission? No               Current Medications   Current Facility-Administered Medications:  .   0.9 %  sodium chloride infusion, , Intravenous, PRN, Wynetta Emery, Clanford L, MD, Stopped at 04/23/20 2104 .  acetaminophen (TYLENOL) tablet 500 mg, 500 mg, Oral, Q6H PRN, [place] Donnamarie Poag, DO, 500 mg at 05/03/20 1455 .  atorvastatin (LIPITOR) tablet 20 mg, 20 mg, Oral, QPM, [place] Donnamarie Poag, DO, 20 mg at 05/04/20 1839 .  bisacodyl (DULCOLAX) suppository 10 mg, 10 mg, Rectal, Daily, Iona Beard, MD, 10 mg at 05/04/20 0920 .  chlorhexidine (PERIDEX) 0.12 % solution 15 mL, 15 mL, Mouth Rinse, BID, Johnson, Clanford L, MD, 15 mL at 05/04/20 2140 .  Chlorhexidine Gluconate Cloth 2 % PADS 6 each, 6 each, Topical, Daily, Johnson, Clanford L, MD, 6 each at 05/04/20 1000 .  clonazePAM (KLONOPIN) tablet 1 mg, 1 mg, Oral, TID, [place] Donnamarie Poag, DO, 1 mg at 05/04/20 2140 .  diazepam (VALIUM) tablet 2 mg, 2 mg, Oral, QHS, [place] Donnamarie Poag, DO, 2 mg at 05/04/20 2141 .  docusate sodium (COLACE) capsule 100 mg, 100 mg, Oral, BID, [place] Donnamarie Poag, DO, 100 mg at 05/04/20 2141 .  enoxaparin (LOVENOX) injection 40 mg, 40 mg, Subcutaneous, Q24H, Johnson, Clanford L, MD, 40 mg at 05/04/20 2140 .  feeding supplement (ENSURE ENLIVE / ENSURE PLUS) liquid 237 mL, 237 mL, Oral, TID BM, [place] Eric J, DO, 237 mL at 05/04/20 1358 .  guaiFENesin-dextromethorphan (ROBITUSSIN DM) 100-10 MG/5ML syrup 10 mL, 10 mL, Oral, Q4H PRN, [place] Donnamarie Poag, DO, 10 mL at 05/03/20 0658 .  insulin aspart (novoLOG) injection 0-9 Units, 0-9 Units, Subcutaneous, Q4H, Anders Simmonds, MD, 1 Units at 05/04/20 1840 .  ipratropium-albuterol (DUONEB) 0.5-2.5 (3) MG/3ML nebulizer solution 3 mL, 3 mL, Nebulization, Q4H PRN, Candee Furbish, MD, 3 mL at 04/24/20 0401 .  lacosamide (VIMPAT) tablet 200 mg, 200 mg, Oral, BID, [place] Donnamarie Poag, DO, 200 mg at 05/04/20 2140 .  lip balm (CARMEX) ointment, , Topical, PRN, Spero Geralds, MD, Given at 04/27/20 0235 .  LORazepam (ATIVAN) injection 2 mg, 2 mg, Intravenous, PRN, Seawell, Jaimie A, DO, 2 mg at 04/24/20  0534 .  MEDLINE mouth rinse, 15 mL, Mouth Rinse, q12n4p, Johnson, Clanford L, MD, 15 mL at 05/04/20 1840 .  metoprolol tartrate (LOPRESSOR) tablet 25 mg, 25 mg, Oral, BID, [place] Donnamarie Poag, DO, 25 mg at 05/04/20 2141 .  multivitamin with minerals tablet 1 tablet, 1 tablet, Oral, Daily, [place] Donnamarie Poag, DO, 1 tablet at 05/04/20 0919 .  ondansetron (ZOFRAN) tablet 4 mg, 4 mg, Oral, Q6H PRN **OR** ondansetron (ZOFRAN) injection 4 mg, 4 mg, Intravenous, Q6H PRN, [place] Eric J, DO .  pantoprazole (PROTONIX) EC  tablet 40 mg, 40 mg, Oral, Q supper, [place] Donnamarie Poag, DO, 40 mg at 05/04/20 1839 .  phenol (CHLORASEPTIC) mouth spray 1 spray, 1 spray, Mouth/Throat, Q6H PRN, Margaretmary Lombard, MD, 1 spray at 04/27/20 0234 .  polyethylene glycol (MIRALAX / GLYCOLAX) packet 17 g, 17 g, Per Tube, Daily, Candee Furbish, MD, 17 g at 05/04/20 0916 .  QUEtiapine (SEROQUEL) tablet 150 mg, 150 mg, Oral, QHS, [place] Donnamarie Poag, DO, 150 mg at 05/04/20 2140 .  QUEtiapine (SEROQUEL) tablet 75 mg, 75 mg, Oral, Daily, [place] Eric J, DO, 75 mg at 05/04/20 0919 .  [employer], , Oral, Once, Candee Furbish, MD .  sodium chloride flush (NS) 0.9 % injection 10-40 mL, 10-40 mL, Intracatheter, Q12H, Candee Furbish, MD, 10 mL at 05/04/20 2141 .  sodium chloride flush (NS) 0.9 % injection 10-40 mL, 10-40 mL, Intracatheter, PRN, Candee Furbish, MD, 10 mL at 04/26/20 0217 .  valproic acid (DEPAKENE) 250 MG/5ML solution 500 mg, 500 mg, Per Tube, BID, Candee Furbish, MD, 500 mg at 05/04/20 2141   Patients Current Diet:     Diet Order                      DIET DYS 3 Room service appropriate? No; Fluid consistency: Thin  Diet effective now                      Precautions / Restrictions Precautions Precautions: Fall Precaution Comments: cortrak Restrictions Weight Bearing Restrictions: No    Has the patient had 2 or more falls or a fall with injury in the past year? No   Prior Activity Level Limited  Community (1-2x/wk): Independent with mobility and adls   Prior Functional Level Self Care: Did the patient need help bathing, dressing, using the toilet or eating? Independent   Indoor Mobility: Did the patient need assistance with walking from room to room (with or without device)? Independent   Stairs: Did the patient need assistance with internal or external stairs (with or without device)? Independent   Functional Cognition: Did the patient need help planning regular tasks such as shopping or remembering to take medications? Needed some help   Home Assistive Devices / [place] Devices/Equipment: None Home Equipment: Walker - 2 wheels, Shower seat, Bedside commode, Wheelchair - manual   Prior Device Use: Indicate devices/aids used by the patient prior to current illness, exacerbation or injury? None of the above   Current Functional Level Cognition   Overall Cognitive Status: Impaired/Different from baseline Current Attention Level: Sustained Orientation Level: Oriented to person, Oriented to place, Disoriented to time, Disoriented to situation Following Commands: Follows one step commands with increased time, Follows one step commands inconsistently Safety/Judgement: Decreased awareness of safety, Decreased awareness of deficits General Comments: Pt following basic commands at times with delay, needs multimodal cues to initiate movement to EOB.  Sustained attention in busy room, but often distracted by sister and RN who were also talking/in room working.  Verbalized need to use the bathroom appropriately once EOB.      Extremity Assessment (includes Sensation/Coordination)   Upper Extremity Assessment: Difficult to assess due to impaired cognition  Lower Extremity Assessment: Difficult to assess due to impaired cognition     ADLs   Overall ADL's : Needs assistance/impaired Grooming: Wash/dry face, Min guard, Sitting, Wash/dry hands Upper Body Bathing: Minimal  assistance, Sitting Upper Body Bathing Details (indicate cue type and reason):  simulated seated EOB Upper Body Dressing : Minimal assistance Upper Body Dressing Details (indicate cue type and reason): donned clean gown seated EOB Toilet Transfer: Moderate assistance, +2 for physical assistance, Control and instrumentation engineer Details (indicate cue type and reason): bed>recliner on her right Toileting- Clothing Manipulation and Hygiene: Total assistance General ADL Comments: Pt is currently totalA for all ADL, follow commands intermittently. provided hand over hand assistance for pt to wash her face, no noted active participation     Mobility   Overal bed mobility: Needs Assistance Bed Mobility: Supine to Sit, Sit to Supine Rolling: Mod assist Supine to sit: Mod assist, HOB elevated Sit to supine: Min assist General bed mobility comments: Pt needed mod assist to support trunk and progress bil LEs to EOB, pt kept putting them back into bed.  Min assist at trunk to return to supine, pt able to lift both of her legs back.      Transfers   Overall transfer level: Needs assistance Equipment used: 2 person hand held assist, Rolling walker (2 wheeled) Transfers: Sit to/from Stand, Stand Pivot Transfers Sit to Stand: Mod assist, +2 physical assistance, Min assist Stand pivot transfers: +2 physical assistance, Mod assist General transfer comment: Pt stood EOB with two person hand held assist  with  to [facility] after reporting the need to have to urinate.  Total assist for peri care and still mod assist of one to transfer back to bed with RW, uncontrolled premature sit to bed.     Ambulation / Gait / Stairs / Wheelchair Mobility   Ambulation/Gait General Gait Details: I think pt could be ready for short distance gait with RW and chair to follow very closely.      Posture / Balance Dynamic Sitting Balance Sitting balance - Comments: need min to mod assist EOB due to posterior lean/LOB when therapist  releases support.   Balance Overall balance assessment: Needs assistance Sitting-balance support: Feet supported, Bilateral upper extremity supported, No upper extremity supported Sitting balance-Leahy Scale: Poor Sitting balance - Comments: need min to mod assist EOB due to posterior lean/LOB when therapist releases support.   Postural control: Posterior lean Standing balance support: Bilateral upper extremity supported Standing balance-Leahy Scale: Poor Standing balance comment: two person mod assist with no device one person min to mod assist with RW.      Special needs/care consideration Seizure Precautions Intellectual delay at baseline    Previous Home Environment  Living Arrangements: Parent  Lives With:  (parent/Mom) Available Help at Discharge: Family, Available 24 hours/day Type of Home: House Home Layout: Two level, Laundry or work area in basement, Able to live on main level with bedroom/bathroom Home Access: Stairs to enter Technical brewer of Steps: 1 Bathroom Shower/Tub: Chiropodist: Standard Bathroom Accessibility: Yes How Accessible: Accessible via walker : No   Discharge Living Setting Plans for Discharge Living Setting: Patient's home, Lives with (comment) (Mom) Type of Home at Discharge: House Discharge [place]: Two level, Laundry or work area in basement Discharge Home Access: Stairs to enter Entrance Stairs-Rails: None Technical brewer of Steps: 1 Discharge Bathroom Shower/Tub: Designer, fashion/clothing: Standard Discharge Bathroom Accessibility: Yes How Accessible: Accessible via walker Does the patient have any problems obtaining your medications?: No   Social/Family/Support Systems Contact Information: [name], sister is main contact Anticipated Caregiver: [name] and her Mom Anticipated Caregiver's Contact Information: see above Ability/Limitations of Caregiver: Mom receives  direction from [place] on how to handle situations Caregiver Availability: 24/7  Discharge Plan Discussed with Primary Caregiver: Yes Is Caregiver In Agreement with Plan?: Yes Does Caregiver/Family have Issues with Lodging/Transportation while Pt is in Rehab?: No   Goals Patient/Family Goal for Rehab: Supervision with PT, OT, and SLP Expected length of stay: ELOS 2 weeks; Family would like to attend 12/18 [facility] [place] at 8 pm Additional Information: Intellectual delay Pt/Family Agrees to Admission and willing to participate: Yes Program Orientation Provided & Reviewed with Pt/Caregiver Including Roles  & Responsibilities: Yes   Decrease burden of Care through IP rehab admission: n/a   Possible need for SNF placement upon discharge: not anticipated   Patient Condition: I have reviewed medical records [facility] , spoken with CSW, and patient and family member. I met with patient at the bedside for inpatient rehabilitation assessment.  Patient will benefit from ongoing PT, OT and SLP, can actively participate in 3 hours of therapy a day 5 days of the week, and can make measurable gains during the admission.  Patient will also benefit from the coordinated team approach during an Inpatient Acute Rehabilitation admission.  The patient will receive intensive therapy as well as Rehabilitation physician, nursing, social worker, and care management interventions.  Due to bladder management, bowel management, safety, skin/wound care, disease management, medication administration, pain management and patient education the patient requires 24 hour a day rehabilitation nursing.  The patient is currently mod assist overall  with mobility and basic ADLs.  Discharge setting and therapy post discharge at home with home health is anticipated.  Patient has agreed to participate in the Acute Inpatient Rehabilitation Program and will admit today.    Preadmission Screen Completed By:  Cleatrice Burke, 05/05/2020 10:40 AM ______________________________________________________________________   Discussed status with Dr. Ranell Patrick on  05/05/2020 at 1055 and received approval for admission today.   Admission Coordinator:  Cleatrice Burke, RN, time  1055 Date  05/05/2020    Assessment/Plan: Diagnosis: Debility 2/2 pyelonephritis 1. Does the need for close, 24 hr/day Medical supervision in concert with the patient's rehab needs make it unreasonable for this patient to be served in a less intensive setting? Yes 2. Co-Morbidities requiring supervision/potential complications: seizure disorder, dysphagia, GERD, intellectual disability, frequent falls, renal abscess, leukocytosis 3. Due to bladder management, bowel management, safety, skin/wound care, disease management, medication administration, pain management and patient education, does the patient require 24 hr/day rehab nursing? Yes 4. Does the patient require coordinated care of a physician, rehab nurse, PT, OT, and SLP to address physical and functional deficits in the context of the above medical diagnosis(es)? Yes Addressing deficits in the following areas: balance, endurance, locomotion, strength, transferring, bowel/bladder control, bathing, dressing, feeding, grooming, toileting, cognition and psychosocial support 5. Can the patient actively participate in an intensive therapy program of at least 3 hrs of therapy 5 days a week? Yes 6. The potential for patient to make measurable gains while on inpatient rehab is excellent 7. Anticipated functional outcomes upon discharge from inpatient rehab: min assist PT, min assist OT, min assist SLP 8. Estimated rehab length of stay to reach the above functional goals is: 2-3 weeks 9. Anticipated discharge destination: Home 10. Overall Rehab/Functional Prognosis: good     MD Signature: Leeroy Cha, MD        Revision History                      Note Details  Author Ranell Patrick, Clide Deutscher, MD  File Time 05/05/2020 11:37 AM  Author Type Physician Status Signed  Last Editor Izora Ribas, MD Service Physical Medicine and Rehabilitation

## 2020-05-05 NOTE — Progress Notes (Incomplete)
Patient ID: MICHALENE DEBRULER, female   DOB: 21-Feb-1961, 59 y.o.   MRN: [id number]  This NP visited patient at the bedside as a follow up for palliative medicine needs and emotional support.  No family at bedisde  at bedside.  Patient remains   Speech therapy is currently working with Marzetta Board, hoping to advance her diet  Spoke to sister by phone.  Family face ongoing decisions related to treatment options, advance directives and anticipatory care needs.  Therapeutic listening and emotional support offered.  PMT will continue to support holistically  Questions and concerns addressed   Discussed with bedside RN and Speech therapy  Total time spent on the unit was 20 minutes  Greater than 50% of the time was spent in counseling and coordination of care  Wadie Lessen NP  Palliative Medicine Team Team Phone # [phone] Pager [phone]

## 2020-05-05 NOTE — Progress Notes (Signed)
Medications as ordered this shift, Doculax supp. As ordered. Rectal vault empty and no return of stool observed. Will continue to monitor.

## 2020-05-05 NOTE — H&P (Signed)
Physical Medicine and Rehabilitation Admission H&P    Chief Complaint  Patient presents with  . debility    HPI: Maria [name] is a 59 year old female with history of seizure disorder, intellectual delay, esophageal stricture, tremors who was originally admitted on 04/14/20 with fevers, decrease intake X 3 days and mental status changes due to septic shock from pyelonephritis due to E coli UTI. She was treated with antibiotics and hospital course significant for break through seizures as well as delirium with agitation as well as hyperammonemia. Cortak placed for nutritional support and she required pressors for BP support as well as Precedex -->sitter due to agitation. Dr. Dalene Carrow consulted for input on seizures and she was loaded with Keppra. Neurology recommends continuing Valproic acid and depakene as felt to have baseline brief ictal-interictal discharges and no increase in AEDs unless patient has clinical seizures.  ST following for input on dysphagia and as mentation/lethargy improved, was started on dysphagia 1, thins on 11/29. MBS done 11/30 due to hypoxia with concerns of aspiration event. This revealed functional swallow without aspiration and she was advanced to dysphagia 3, thins. Therapy ongoing and patient noted to be debilitated. She continues to have balance deficits with tachycardia with activity as well as delayed processing with cognitive deficits affecting ADL/mobility. CIR recommended due to functional decline.    Review of Systems  Constitutional: Negative for chills and fever.  HENT: Negative for hearing loss.   Eyes: Negative for blurred vision.  Respiratory: Negative for cough and shortness of breath.   Cardiovascular: Negative for chest pain.  Gastrointestinal: Negative for abdominal pain, heartburn and nausea.       Appetite has been poor but drinking well per RN  Musculoskeletal: Negative for myalgias.  Skin: Negative for rash.  Neurological: Positive for  headaches. Negative for dizziness.  Psychiatric/Behavioral: The patient has insomnia (has not been sleeping at nights).      Past Medical History:  Diagnosis Date  . Constipation   . Dysphagia   . Fracture    R foot  . GERD (gastroesophageal reflux disease)   . History of shingles 10/2016  . Mental retardation    lesion in head  . Seizures ([place])    "not fully controlled on max doses of meds" (Neuro ofc note 12/2014)  . Thrombocytopenia ([place])   . Tremor     Past Surgical History:  Procedure Laterality Date  . BIOPSY  09/16/2014   Procedure: BIOPSY;  Surgeon: Rogene Houston, MD;  Location: [facility];  Service: Endoscopy;;  . CATARACT EXTRACTION     both eyes, May of 2015  . COLONOSCOPY    . COLONOSCOPY WITH PROPOFOL N/A 11/20/2018   Procedure: COLONOSCOPY WITH PROPOFOL;  Surgeon: Rogene Houston, MD;  Location: [facility];  Service: Endoscopy;  Laterality: N/A;  . ESOPHAGEAL DILATION N/A 09/16/2014   Procedure: ESOPHAGEAL DILATION WITH 54FR MALONEY DILATOR;  Surgeon: Rogene Houston, MD;  Location: [facility];  Service: Endoscopy;  Laterality: N/A;  . ESOPHAGOGASTRODUODENOSCOPY (EGD) WITH PROPOFOL N/A 09/16/2014   Procedure: ESOPHAGOGASTRODUODENOSCOPY (EGD) WITH PROPOFOL;  Surgeon: Rogene Houston, MD;  Location: [facility];  Service: Endoscopy;  Laterality: N/A;  . MOUTH SURGERY    . POLYPECTOMY  11/20/2018   Procedure: POLYPECTOMY;  Surgeon: Rogene Houston, MD;  Location: [facility];  Service: Endoscopy;;  colon  . Skin graft to gum Right 08/2013  . TONSILLECTOMY AND ADENOIDECTOMY    . TOTAL ABDOMINAL HYSTERECTOMY  Family History  Problem Relation Age of Onset  . High Cholesterol Mother   . High blood pressure Mother   . Diabetes Father     Social History:  Per reports she quit smoking about 13 years ago. Her smoking use included cigarettes. She has a 22.50 pack-year smoking history. She has never used smokeless tobacco. She reports that she does not drink alcohol and does  not use drugs.   Allergies  Allergen Reactions  . Codeine Nausea And Vomiting  . Lamictal [Lamotrigine] Rash    Medications Prior to Admission  Medication Sig Dispense Refill  . atorvastatin (LIPITOR) 20 MG tablet Take 20 mg by mouth daily.    . clonazePAM (KLONOPIN) 0.5 MG tablet Take 1 tablet (0.5 mg total) by mouth 2 (two) times daily. 180 tablet 1  . divalproex (DEPAKOTE ER) 500 MG 24 hr tablet Take 1 tablet (500 mg total) by mouth 2 (two) times daily. 180 tablet 3  . ibuprofen (ADVIL) 200 MG tablet Take 400 mg by mouth every 6 (six) hours as needed for moderate pain.    Marland Kitchen lacosamide (VIMPAT) 200 MG TABS tablet Take 1 tablet (200 mg total) by mouth 2 (two) times daily. 180 tablet 1  . levocetirizine (XYZAL) 5 MG tablet Take 5 mg by mouth every evening.     Marland Kitchen omeprazole (PRILOSEC) 40 MG capsule Take 1 capsule (40 mg total) by mouth daily. 90 capsule 3  . oxybutynin (DITROPAN-XL) 5 MG 24 hr tablet Take 5 mg by mouth at bedtime.     . Pediatric Multiple Vit-C-FA (PEDIATRIC MULTIVITAMIN) chewable tablet Chew 1 tablet by mouth daily.        Drug Regimen Review  Drug regimen was reviewed and remains appropriate with no significant issues identified  Home: Home Living Family/patient expects to be discharged to:: Private residence Living Arrangements: Parent Available Help at Discharge: Family, Available 24 hours/day Type of Home: House Home Access: Stairs to enter Technical brewer of Steps: 1 Home Layout: Two level, Laundry or work area in basement, Able to live on main level with bedroom/bathroom Bathroom Shower/Tub: Chiropodist: Standard Bathroom Accessibility: Yes Home Equipment: Environmental consultant - 2 wheels, Shower seat, Bedside commode, Wheelchair - manual  Lives With:  (parent/Mom)   Functional History: Prior Function Level of Independence: Needs assistance Gait / Transfers Assistance Needed: walks without RW ADL's / Homemaking Assistance Needed: performs  ADLs on her own Comments: supervision for safety, cognition, medication  Functional Status:  Mobility: Bed Mobility Overal bed mobility: Needs Assistance Bed Mobility: Supine to Sit, Sit to Supine Rolling: Mod assist Supine to sit: Mod assist, HOB elevated Sit to supine: Min assist General bed mobility comments: Pt needed mod assist to support trunk and progress bil LEs to EOB, pt kept putting them back into bed.  Min assist at trunk to return to supine, pt able to lift both of her legs back.  Transfers Overall transfer level: Needs assistance Equipment used: 2 person hand held assist, Rolling walker (2 wheeled) Transfers: Sit to/from Stand, Stand Pivot Transfers Sit to Stand: Mod assist, +2 physical assistance, Min assist Stand pivot transfers: +2 physical assistance, Mod assist General transfer comment: Pt stood EOB with two person hand held assist  with  to [facility] after reporting the need to have to urinate.  Total assist for peri care and still mod assist of one to transfer back to bed with RW, uncontrolled premature sit to bed. Ambulation/Gait General Gait Details: I think pt could be  ready for short distance gait with RW and chair to follow very closely.     ADL: ADL Overall ADL's : Needs assistance/impaired Grooming: Wash/dry face, Min guard, Sitting, Wash/dry hands Upper Body Bathing: Minimal assistance, Sitting Upper Body Bathing Details (indicate cue type and reason): simulated seated EOB Upper Body Dressing : Minimal assistance Upper Body Dressing Details (indicate cue type and reason): donned clean gown seated EOB Toilet Transfer: Moderate assistance, +2 for physical assistance, Control and instrumentation engineer Details (indicate cue type and reason): bed>recliner on her right Toileting- Clothing Manipulation and Hygiene: Total assistance General ADL Comments: Pt is currently totalA for all ADL, follow commands intermittently. provided hand over hand assistance for pt to wash her  face, no noted active participation  Cognition: Cognition Overall Cognitive Status: Impaired/Different from baseline Orientation Level: Oriented to person, Oriented to place, Disoriented to time, Disoriented to situation Cognition Arousal/Alertness: Awake/alert Behavior During Therapy: Flat affect, Impulsive Overall Cognitive Status: Impaired/Different from baseline Area of Impairment: Attention, Following commands, Safety/judgement, Awareness, Problem solving Orientation Level:  (knows her sister) Current Attention Level: Sustained Following Commands: Follows one step commands with increased time, Follows one step commands inconsistently Safety/Judgement: Decreased awareness of safety, Decreased awareness of deficits Awareness: Intellectual Problem Solving: Slow processing, Difficulty sequencing, Requires verbal cues, Requires tactile cues, Decreased initiation General Comments: Pt following basic commands at times with delay, needs multimodal cues to initiate movement to EOB.  Sustained attention in busy room, but often distracted by sister and RN who were also talking/in room working.  Verbalized need to use the bathroom appropriately once EOB.     Blood pressure 111/61, pulse (!) 103, temperature 99 F (37.2 C), temperature source Oral, resp. rate 20, height 5\' 6"  (1.676 m), weight 61.8 kg, SpO2 92 %. Physical Exam  General: Alert, No apparent distress HEENT: Head is normocephalic, atraumatic, PERRLA, EOMI, sclera anicteric, oral mucosa pink and moist, dentition intact, ext ear canals clear,  Neck: Supple without JVD or lymphadenopathy Heart: Tachycardic. No murmurs rubs or gallops Chest: CTA bilaterally without wheezes, rales, or rhonchi; no distress Abdomen: Soft, non-tender, non-distended, bowel sounds positive. Extremities: No clubbing, cyanosis, or edema. Pulses are 2+ Skin: Clean and intact without signs of breakdown Neuro: Kept eyes closed for most of exam--reporting  fatigue. Childlike with staccato speech at times. She was oriented to place, situation, DOB and age without difficulty, she did not want to participate in MMT--doesn't like to "push"anyone away but moves all four with tactile and verbal coaxing cues.  Psych: Pt's affect is appropriate. Pt is cooperative   Results for orders placed or performed during the hospital encounter of 04/14/20 (from the past 48 hour(s))  Glucose, capillary     Status: Abnormal   Collection Time: 05/03/20 11:54 AM  Result Value Ref Range   Glucose-Capillary 116 (H) 70 - 99 mg/dL    Comment: Glucose reference range applies only to samples taken after fasting for at least 8 hours.  Glucose, capillary     Status: Abnormal   Collection Time: 05/03/20  3:58 PM  Result Value Ref Range   Glucose-Capillary 148 (H) 70 - 99 mg/dL    Comment: Glucose reference range applies only to samples taken after fasting for at least 8 hours.  Glucose, capillary     Status: None   Collection Time: 05/03/20  8:23 PM  Result Value Ref Range   Glucose-Capillary 98 70 - 99 mg/dL    Comment: Glucose reference range applies only to samples taken after fasting for  at least 8 hours.  Glucose, capillary     Status: Abnormal   Collection Time: 05/04/20 12:07 AM  Result Value Ref Range   Glucose-Capillary 111 (H) 70 - 99 mg/dL    Comment: Glucose reference range applies only to samples taken after fasting for at least 8 hours.  Glucose, capillary     Status: None   Collection Time: 05/04/20  4:11 AM  Result Value Ref Range   Glucose-Capillary 91 70 - 99 mg/dL    Comment: Glucose reference range applies only to samples taken after fasting for at least 8 hours.  Glucose, capillary     Status: None   Collection Time: 05/04/20  7:23 AM  Result Value Ref Range   Glucose-Capillary 96 70 - 99 mg/dL    Comment: Glucose reference range applies only to samples taken after fasting for at least 8 hours.  Glucose, capillary     Status: Abnormal    Collection Time: 05/04/20 12:07 PM  Result Value Ref Range   Glucose-Capillary 121 (H) 70 - 99 mg/dL    Comment: Glucose reference range applies only to samples taken after fasting for at least 8 hours.  CBC     Status: Abnormal   Collection Time: 05/04/20  3:28 PM  Result Value Ref Range   WBC 12.1 (H) 4.0 - 10.5 K/uL   RBC 2.83 (L) 3.87 - 5.11 MIL/uL   Hemoglobin 8.6 (L) 12.0 - 15.0 g/dL   HCT 28.1 (L) 36 - 46 %   MCV 99.3 80.0 - 100.0 fL   MCH 30.4 26.0 - 34.0 pg   MCHC 30.6 30.0 - 36.0 g/dL   RDW 15.1 11.5 - 15.5 %   Platelets 414 (H) 150 - 400 K/uL   nRBC 0.0 0.0 - 0.2 %    Comment: Performed at [facility] [address], [place], [place] [address]  Basic metabolic panel     Status: Abnormal   Collection Time: 05/04/20  3:28 PM  Result Value Ref Range   Sodium 138 135 - 145 mmol/L   Potassium 4.2 3.5 - 5.1 mmol/L   Chloride 96 (L) 98 - 111 mmol/L   CO2 26 22 - 32 mmol/L   Glucose, Bld 114 (H) 70 - 99 mg/dL    Comment: Glucose reference range applies only to samples taken after fasting for at least 8 hours.   BUN 21 (H) 6 - 20 mg/dL   Creatinine, Ser 0.99 0.44 - 1.00 mg/dL   Calcium 9.8 8.9 - 10.3 mg/dL   GFR, Estimated >60 >60 mL/min    Comment: (NOTE) Calculated using the CKD-EPI Creatinine Equation (2021)    Anion gap 16 (H) 5 - 15    Comment: Performed at [facility] [address], [place], [place] [address]  Procalcitonin - Baseline     Status: None   Collection Time: 05/04/20  3:28 PM  Result Value Ref Range   Procalcitonin <0.10 ng/mL    Comment:        Interpretation: PCT (Procalcitonin) <= 0.5 ng/mL: Systemic infection (sepsis) is not likely. Local bacterial infection is possible. (NOTE)       Sepsis PCT Algorithm           Lower Respiratory Tract                                      Infection PCT Algorithm    ----------------------------     ----------------------------  PCT < 0.25 ng/mL                PCT < 0.10 ng/mL           Strongly encourage             Strongly discourage   discontinuation of antibiotics    initiation of antibiotics    ----------------------------     -----------------------------       PCT 0.25 - 0.50 ng/mL            PCT 0.10 - 0.25 ng/mL               OR       >80% decrease in PCT            Discourage initiation of                                            antibiotics      Encourage discontinuation           of antibiotics    ----------------------------     -----------------------------         PCT >= 0.50 ng/mL              PCT 0.26 - 0.50 ng/mL               AND        <80% decrease in PCT             Encourage initiation of                                             antibiotics       Encourage continuation           of antibiotics    ----------------------------     -----------------------------        PCT >= 0.50 ng/mL                  PCT > 0.50 ng/mL               AND         increase in PCT                  Strongly encourage                                      initiation of antibiotics    Strongly encourage escalation           of antibiotics                                     -----------------------------                                           PCT <= 0.25 ng/mL  OR                                        > 80% decrease in PCT                                      Discontinue / Do not initiate                                             antibiotics  Performed at [facility], [place] [address] [place], [place] [address]   Glucose, capillary     Status: Abnormal   Collection Time: 05/04/20  3:49 PM  Result Value Ref Range   Glucose-Capillary 125 (H) 70 - 99 mg/dL    Comment: Glucose reference range applies only to samples taken after fasting for at least 8 hours.  Glucose, capillary     Status: Abnormal   Collection Time: 05/04/20  8:08 PM  Result Value Ref Range   Glucose-Capillary 100 (H) 70 - 99 mg/dL     Comment: Glucose reference range applies only to samples taken after fasting for at least 8 hours.  Glucose, capillary     Status: Abnormal   Collection Time: 05/05/20 12:09 AM  Result Value Ref Range   Glucose-Capillary 107 (H) 70 - 99 mg/dL    Comment: Glucose reference range applies only to samples taken after fasting for at least 8 hours.  Creatinine, serum     Status: None   Collection Time: 05/05/20  4:19 AM  Result Value Ref Range   Creatinine, Ser 0.98 0.44 - 1.00 mg/dL   GFR, Estimated >60 >60 mL/min    Comment: (NOTE) Calculated using the CKD-EPI Creatinine Equation (2021) Performed at [facility] [address], [place], [place] [address]   Procalcitonin     Status: None   Collection Time: 05/05/20  4:19 AM  Result Value Ref Range   Procalcitonin <0.10 ng/mL    Comment:        Interpretation: PCT (Procalcitonin) <= 0.5 ng/mL: Systemic infection (sepsis) is not likely. Local bacterial infection is possible. (NOTE)       Sepsis PCT Algorithm           Lower Respiratory Tract                                      Infection PCT Algorithm    ----------------------------     ----------------------------         PCT < 0.25 ng/mL                PCT < 0.10 ng/mL          Strongly encourage             Strongly discourage   discontinuation of antibiotics    initiation of antibiotics    ----------------------------     -----------------------------       PCT 0.25 - 0.50 ng/mL            PCT 0.10 - 0.25 ng/mL  OR       >80% decrease in PCT            Discourage initiation of                                            antibiotics      Encourage discontinuation           of antibiotics    ----------------------------     -----------------------------         PCT >= 0.50 ng/mL              PCT 0.26 - 0.50 ng/mL               AND        <80% decrease in PCT             Encourage initiation of                                             antibiotics        Encourage continuation           of antibiotics    ----------------------------     -----------------------------        PCT >= 0.50 ng/mL                  PCT > 0.50 ng/mL               AND         increase in PCT                  Strongly encourage                                      initiation of antibiotics    Strongly encourage escalation           of antibiotics                                     -----------------------------                                           PCT <= 0.25 ng/mL                                                 OR                                        > 80% decrease in PCT                                      Discontinue / Do not initiate  antibiotics  Performed at [facility], [place] [address] [place], [place] [address]   Glucose, capillary     Status: None   Collection Time: 05/05/20  4:22 AM  Result Value Ref Range   Glucose-Capillary 94 70 - 99 mg/dL    Comment: Glucose reference range applies only to samples taken after fasting for at least 8 hours.  Glucose, capillary     Status: None   Collection Time: 05/05/20  7:31 AM  Result Value Ref Range   Glucose-Capillary 86 70 - 99 mg/dL    Comment: Glucose reference range applies only to samples taken after fasting for at least 8 hours.   No results found.     Medical Problem List and Plan: 1.  Impaired mobility and ADLs secondary to left kidney pyelonephritis  -patient may shower  -ELOS/Goals: minA 2-3 weeks 2.  Antithrombotics: -DVT/anticoagulation:  Pharmaceutical: Lovenox  -antiplatelet therapy: N/A 3. Headaches/Pain Management: tylenol prn 4. Mood: LCSW to follow for evaluation and support.   -antipsychotic agents: N/A 5. Neuropsych: This patient is not fully capable of making decisions on her own behalf. 6. Skin/Wound Care: Routine pressure relief measures.  7. Fluids/Electrolytes/Nutrition: Monitor I/O. Follow up labs Monday.  8.  Seizure disorder: Continue Vimpat 200 mg bid and Depakene 500 mg bid.  --will order sleep wake chart to monitor sleep pattern.  --question sedative effects of klonopin/Seroquel (was added 11/26) .  --Recheck ammonia levels as on Valproic acid. Was 76-->46-->43 9. Dehydration: Improving. Encourage fluid intake --offer between meals. 10. Leucocytosis: Resolving.   --Recheck CBC on Monday 11. Anemia: Due to critical illness? H/H has been in 8-9 range. 8.6 on 12/3. Will order anemia panel 12. Tachycardia: Monitor HR tid--on metoprolol bid. Currently better controlled. 13. Delirium: On Seroquel 75 mg am/150 mg HS in addition to Klonopin 1 mg tid.  14. Stress induced hyperglycemia: Hgb A1C-5.7. CBGs 86-123 on 12/3 15. Constipation: On multiple laxatives including daily suppository.   I have personally performed a face to face diagnostic evaluation, including, but not limited to relevant history and physical exam findings, of this patient and developed relevant assessment and plan.  Additionally, I have reviewed and concur with the physician assistant's documentation above.  Leeroy Cha, MD  Bary Leriche, PA-C 05/05/2020

## 2020-05-05 NOTE — PMR Pre-admission (Signed)
PMR Admission Coordinator Pre-Admission Assessment  Patient: Maria [name] is an 59 y.o., female MRN: [id number] DOB: 1960-10-07 Height: _0  (167.6 cm) Weight: 61.8 kg  Insurance Information HMO:     PPO:      PCP:      IPA:      80/20:      OTHER:  PRIMARY: Medicare a and b      Policy#: [id number]      Subscriber: pt Benefits:  Phone #: passport one online     Name: 05/02/3020 Eff. Date: 06/03/1994     Deduct: $1484      Out of Pocket Max: none      Life Max: none CIR: 100%      SNF: 20 days Outpatient: 80%     Co-Pay: 20% Home Health: 100%      Co-Pay: none DME: 80%     Co-Pay: 20% Providers: pt choice  SECONDARY: Tricare for Life      Policy#: [id number]  Financial Counselor:       Phone#:   The "Data Collection Information Summary" for patients in Inpatient Rehabilitation Facilities with attached "Privacy Act [place] Records" was provided and verbally reviewed with: Family  Emergency Contact Information Contact Information    Name Relation Home Work Mobile   [place] Mother [phone]  [phone]   [facility] Sister [phone]     [place], Sison   [phone]      Current Medical History  Patient Admitting Diagnosis: Debility due to pyelonephritis  History of Present Illness: 59 year old female with medical history significant for constipation, epilepsy, intellectual disability, GERD, dysphagia, tremors who presented 04/14/2020 with issues with left lower quadrant abdominal pain for several weeks. Diagnosed with vaginal infection and completed course of metronidazole. Poor appetite and poor oral intake with fever and decline in mentation. Transferred from [facility] to [facility]  ICU on 04/16/2020 with septic shock secondary to E. Coli UTI/left pyelonephritis complicated by break through seizures.   Patient weaned from vasopressors and completed antibiotic course for E coli/pyelonephritis septic shock. Patient with significant ICU  delirium requiring Precedex drip and has now been transitioned to Seroquel. Continues with waxing and waning mentation and dysphagia.    Developed seizure activity, etiology felt provoked by infectious process. Neurology consulted and began on home antiepileptics. Monitored on EEG with no further seizure activity appreciated. Seizure precautions.   Follows with GI as outpatient Dr. Laural Golden. Has required in the past several esophageal dilations for strictures in the past. SLP followed and now on Dysphagia 3 diet after MBS 05/02/20.  Patient's medical record from [facility] and [facility]  has been reviewed by the rehabilitation admission coordinator and physician.  Past Medical History  Past Medical History:  Diagnosis Date  . Constipation   . Dysphagia   . Fracture    R foot  . GERD (gastroesophageal reflux disease)   . History of shingles 10/2016  . Mental retardation    lesion in head  . Seizures ([place])    "not fully controlled on max doses of meds" (Neuro ofc note 12/2014)  . Thrombocytopenia ([place])   . Tremor     Family History   family history includes Diabetes in her father; High Cholesterol in her mother; High blood pressure in her mother.  Prior Rehab/Hospitalizations Has the patient had prior rehab or hospitalizations prior to admission? Yes  Has the patient had major surgery during 100 days prior to admission? No  Current Medications  Current Facility-Administered Medications:  .  0.9 %  sodium chloride infusion, , Intravenous, PRN, Wynetta Emery, Clanford L, MD, Stopped at 04/23/20 2104 .  acetaminophen (TYLENOL) tablet 500 mg, 500 mg, Oral, Q6H PRN, [place] Donnamarie Poag, DO, 500 mg at 05/03/20 1455 .  atorvastatin (LIPITOR) tablet 20 mg, 20 mg, Oral, QPM, [place] Donnamarie Poag, DO, 20 mg at 05/04/20 1839 .  bisacodyl (DULCOLAX) suppository 10 mg, 10 mg, Rectal, Daily, Iona Beard, MD, 10 mg at 05/04/20 0920 .  chlorhexidine (PERIDEX) 0.12 % solution 15 mL, 15 mL,  Mouth Rinse, BID, Johnson, Clanford L, MD, 15 mL at 05/04/20 2140 .  Chlorhexidine Gluconate Cloth 2 % PADS 6 each, 6 each, Topical, Daily, Johnson, Clanford L, MD, 6 each at 05/04/20 1000 .  clonazePAM (KLONOPIN) tablet 1 mg, 1 mg, Oral, TID, [place] Donnamarie Poag, DO, 1 mg at 05/04/20 2140 .  diazepam (VALIUM) tablet 2 mg, 2 mg, Oral, QHS, [place] Donnamarie Poag, DO, 2 mg at 05/04/20 2141 .  docusate sodium (COLACE) capsule 100 mg, 100 mg, Oral, BID, [place] Donnamarie Poag, DO, 100 mg at 05/04/20 2141 .  enoxaparin (LOVENOX) injection 40 mg, 40 mg, Subcutaneous, Q24H, Johnson, Clanford L, MD, 40 mg at 05/04/20 2140 .  feeding supplement (ENSURE ENLIVE / ENSURE PLUS) liquid 237 mL, 237 mL, Oral, TID BM, [place] Eric J, DO, 237 mL at 05/04/20 1358 .  guaiFENesin-dextromethorphan (ROBITUSSIN DM) 100-10 MG/5ML syrup 10 mL, 10 mL, Oral, Q4H PRN, [place] Donnamarie Poag, DO, 10 mL at 05/03/20 0658 .  insulin aspart (novoLOG) injection 0-9 Units, 0-9 Units, Subcutaneous, Q4H, Anders Simmonds, MD, 1 Units at 05/04/20 1840 .  ipratropium-albuterol (DUONEB) 0.5-2.5 (3) MG/3ML nebulizer solution 3 mL, 3 mL, Nebulization, Q4H PRN, Candee Furbish, MD, 3 mL at 04/24/20 0401 .  lacosamide (VIMPAT) tablet 200 mg, 200 mg, Oral, BID, [place] Donnamarie Poag, DO, 200 mg at 05/04/20 2140 .  lip balm (CARMEX) ointment, , Topical, PRN, Spero Geralds, MD, Given at 04/27/20 0235 .  LORazepam (ATIVAN) injection 2 mg, 2 mg, Intravenous, PRN, Seawell, Jaimie A, DO, 2 mg at 04/24/20 0534 .  MEDLINE mouth rinse, 15 mL, Mouth Rinse, q12n4p, Johnson, Clanford L, MD, 15 mL at 05/04/20 1840 .  metoprolol tartrate (LOPRESSOR) tablet 25 mg, 25 mg, Oral, BID, [place] Donnamarie Poag, DO, 25 mg at 05/04/20 2141 .  multivitamin with minerals tablet 1 tablet, 1 tablet, Oral, Daily, [place] Donnamarie Poag, DO, 1 tablet at 05/04/20 0919 .  ondansetron (ZOFRAN) tablet 4 mg, 4 mg, Oral, Q6H PRN **OR** ondansetron (ZOFRAN) injection 4 mg, 4 mg, Intravenous, Q6H PRN, [place] Eric J,  DO .  pantoprazole (PROTONIX) EC tablet 40 mg, 40 mg, Oral, Q supper, [place] Donnamarie Poag, DO, 40 mg at 05/04/20 1839 .  phenol (CHLORASEPTIC) mouth spray 1 spray, 1 spray, Mouth/Throat, Q6H PRN, Margaretmary Lombard, MD, 1 spray at 04/27/20 0234 .  polyethylene glycol (MIRALAX / GLYCOLAX) packet 17 g, 17 g, Per Tube, Daily, Candee Furbish, MD, 17 g at 05/04/20 0916 .  QUEtiapine (SEROQUEL) tablet 150 mg, 150 mg, Oral, QHS, [place] Donnamarie Poag, DO, 150 mg at 05/04/20 2140 .  QUEtiapine (SEROQUEL) tablet 75 mg, 75 mg, Oral, Daily, [place] Eric J, DO, 75 mg at 05/04/20 0919 .  [employer], , Oral, Once, Candee Furbish, MD .  sodium chloride flush (NS) 0.9 % injection 10-40 mL, 10-40 mL, Intracatheter, Q12H, Candee Furbish, MD, 10 mL at 05/04/20 2141 .  sodium chloride flush (NS) 0.9 % injection 10-40 mL, 10-40 mL, Intracatheter, PRN, Candee Furbish, MD, 10 mL at 04/26/20 0217 .  valproic acid (DEPAKENE) 250 MG/5ML solution 500 mg, 500 mg, Per Tube, BID, Candee Furbish, MD, 500 mg at 05/04/20 2141  Patients Current Diet:  Diet Order            DIET DYS 3 Room service appropriate? No; Fluid consistency: Thin  Diet effective now                 Precautions / Restrictions Precautions Precautions: Fall Precaution Comments: cortrak Restrictions Weight Bearing Restrictions: No   Has the patient had 2 or more falls or a fall with injury in the past year? No  Prior Activity Level Limited Community (1-2x/wk): Independent with mobility and adls  Prior Functional Level Self Care: Did the patient need help bathing, dressing, using the toilet or eating? Independent  Indoor Mobility: Did the patient need assistance with walking from room to room (with or without device)? Independent  Stairs: Did the patient need assistance with internal or external stairs (with or without device)? Independent  Functional Cognition: Did the patient need help planning regular tasks such as shopping or  remembering to take medications? Needed some help  Home Assistive Devices / [place] Devices/Equipment: None Home Equipment: Walker - 2 wheels, Shower seat, Bedside commode, Wheelchair - manual  Prior Device Use: Indicate devices/aids used by the patient prior to current illness, exacerbation or injury? None of the above  Current Functional Level Cognition  Overall Cognitive Status: Impaired/Different from baseline Current Attention Level: Sustained Orientation Level: Oriented to person, Oriented to place, Disoriented to time, Disoriented to situation Following Commands: Follows one step commands with increased time, Follows one step commands inconsistently Safety/Judgement: Decreased awareness of safety, Decreased awareness of deficits General Comments: Pt following basic commands at times with delay, needs multimodal cues to initiate movement to EOB.  Sustained attention in busy room, but often distracted by sister and RN who were also talking/in room working.  Verbalized need to use the bathroom appropriately once EOB.      Extremity Assessment (includes Sensation/Coordination)  Upper Extremity Assessment: Difficult to assess due to impaired cognition  Lower Extremity Assessment: Difficult to assess due to impaired cognition    ADLs  Overall ADL's : Needs assistance/impaired Grooming: Wash/dry face, Min guard, Sitting, Wash/dry hands Upper Body Bathing: Minimal assistance, Sitting Upper Body Bathing Details (indicate cue type and reason): simulated seated EOB Upper Body Dressing : Minimal assistance Upper Body Dressing Details (indicate cue type and reason): donned clean gown seated EOB Toilet Transfer: Moderate assistance, +2 for physical assistance, Control and instrumentation engineer Details (indicate cue type and reason): bed>recliner on her right Toileting- Clothing Manipulation and Hygiene: Total assistance General ADL Comments: Pt is currently totalA for all ADL,  follow commands intermittently. provided hand over hand assistance for pt to wash her face, no noted active participation    Mobility  Overal bed mobility: Needs Assistance Bed Mobility: Supine to Sit, Sit to Supine Rolling: Mod assist Supine to sit: Mod assist, HOB elevated Sit to supine: Min assist General bed mobility comments: Pt needed mod assist to support trunk and progress bil LEs to EOB, pt kept putting them back into bed.  Min assist at trunk to return to supine, pt able to lift both of her legs back.     Transfers  Overall transfer level: Needs assistance Equipment used: 2 person hand held assist, Rolling  walker (2 wheeled) Transfers: Sit to/from Stand, [employer] Transfers Sit to Stand: Mod assist, +2 physical assistance, Min assist Stand pivot transfers: +2 physical assistance, Mod assist General transfer comment: Pt stood EOB with two person hand held assist  with  to [facility] after reporting the need to have to urinate.  Total assist for peri care and still mod assist of one to transfer back to bed with RW, uncontrolled premature sit to bed.    Ambulation / Gait / Stairs / Wheelchair Mobility  Ambulation/Gait General Gait Details: I think pt could be ready for short distance gait with RW and chair to follow very closely.     Posture / Balance Dynamic Sitting Balance Sitting balance - Comments: need min to mod assist EOB due to posterior lean/LOB when therapist releases support.   Balance Overall balance assessment: Needs assistance Sitting-balance support: Feet supported, Bilateral upper extremity supported, No upper extremity supported Sitting balance-Leahy Scale: Poor Sitting balance - Comments: need min to mod assist EOB due to posterior lean/LOB when therapist releases support.   Postural control: Posterior lean Standing balance support: Bilateral upper extremity supported Standing balance-Leahy Scale: Poor Standing balance comment: two person mod assist with no device  one person min to mod assist with RW.     Special needs/care consideration Seizure Precautions Intellectual delay at baseline   Previous Home Environment  Living Arrangements: Parent  Lives With:  (parent/Mom) Available Help at Discharge: Family, Available 24 hours/day Type of Home: House Home Layout: Two level, Laundry or work area in basement, Able to live on main level with bedroom/bathroom Home Access: Stairs to enter Technical brewer of Steps: 1 Bathroom Shower/Tub: Chiropodist: Standard Bathroom Accessibility: Yes How Accessible: Accessible via walker [place]: No  Discharge Living Setting Plans for Discharge Living Setting: Patient's home, Lives with (comment) (Mom) Type of Home at Discharge: House Discharge Home Layout: Two level, Laundry or work area in basement Discharge Home Access: Stairs to enter Entrance Stairs-Rails: None Technical brewer of Steps: 1 Discharge Bathroom Shower/Tub: Designer, fashion/clothing: Standard Discharge Bathroom Accessibility: Yes How Accessible: Accessible via walker Does the patient have any problems obtaining your medications?: No  Social/Family/Support Systems Contact Information: [name], sister is main contact Anticipated Caregiver: [name] and her Mom Anticipated Ambulance person Information: see above Ability/Limitations of Caregiver: Mom receives direction from [place] on how to handle situations Caregiver Availability: 24/7 Discharge Plan Discussed with Primary Caregiver: Yes Is Caregiver In Agreement with Plan?: Yes Does Caregiver/Family have Issues with Lodging/Transportation while Pt is in Rehab?: No  Goals Patient/Family Goal for Rehab: Supervision with PT, OT, and SLP Expected length of stay: ELOS 2 weeks; Family would like to attend 12/18 [facility] at 8 pm Additional Information: Intellectual delay Pt/Family Agrees to Admission and  willing to participate: Yes Program Orientation Provided & Reviewed with Pt/Caregiver Including Roles  & Responsibilities: Yes  Decrease burden of Care through IP rehab admission: n/a  Possible need for SNF placement upon discharge: not anticipated  Patient Condition: I have reviewed medical records from [facility] and [facility] , spoken with CSW, and patient and family member. I met with patient at the bedside for inpatient rehabilitation assessment.  Patient will benefit from ongoing PT, OT and SLP, can actively participate in 3 hours of therapy a day 5 days of the week, and can make measurable gains during the admission.  Patient will also benefit from the coordinated  team approach during an Inpatient Acute Rehabilitation admission.  The patient will receive intensive therapy as well as Rehabilitation physician, nursing, social worker, and care management interventions.  Due to bladder management, bowel management, safety, skin/wound care, disease management, medication administration, pain management and patient education the patient requires 24 hour a day rehabilitation nursing.  The patient is currently mod assist overall  with mobility and basic ADLs.  Discharge setting and therapy post discharge at home with home health is anticipated.  Patient has agreed to participate in the Acute Inpatient Rehabilitation Program and will admit today.  Preadmission Screen Completed By:  Cleatrice Burke, 05/05/2020 10:40 AM ______________________________________________________________________   Discussed status with Dr. Ranell Patrick on  05/05/2020 at 1055 and received approval for admission today.  Admission Coordinator:  Cleatrice Burke, RN, time  1055 Date  05/05/2020   Assessment/Plan: Diagnosis: Debility 2/2 pyelonephritis 1. Does the need for close, 24 hr/day Medical supervision in concert with the patient's rehab needs make it unreasonable for this patient to be served in a  less intensive setting? Yes 2. Co-Morbidities requiring supervision/potential complications: seizure disorder, dysphagia, GERD, intellectual disability, frequent falls, renal abscess, leukocytosis 3. Due to bladder management, bowel management, safety, skin/wound care, disease management, medication administration, pain management and patient education, does the patient require 24 hr/day rehab nursing? Yes 4. Does the patient require coordinated care of a physician, rehab nurse, PT, OT, and SLP to address physical and functional deficits in the context of the above medical diagnosis(es)? Yes Addressing deficits in the following areas: balance, endurance, locomotion, strength, transferring, bowel/bladder control, bathing, dressing, feeding, grooming, toileting, cognition and psychosocial support 5. Can the patient actively participate in an intensive therapy program of at least 3 hrs of therapy 5 days a week? Yes 6. The potential for patient to make measurable gains while on inpatient rehab is excellent 7. Anticipated functional outcomes upon discharge from inpatient rehab: min assist PT, min assist OT, min assist SLP 8. Estimated rehab length of stay to reach the above functional goals is: 2-3 weeks 9. Anticipated discharge destination: Home 10. Overall Rehab/Functional Prognosis: good   MD Signature: Leeroy Cha, MD

## 2020-05-05 NOTE — Discharge Summary (Signed)
Maria [name], is a 59 y.o. female  DOB [birth year]  MRN [id number].  Admission date:  04/14/2020  Admitting Physician  Murlean Iba, MD  Discharge Date:  05/05/2020   Primary MD  Celene Squibb, MD  Recommendations for primary care physician for things to follow:  -Please check CBC, CMP 3 days. -Continue to wean her Seroquel as tolerated, it was started in ICU due to ICU delirium.  Admission Diagnosis  Renal abscess [N15.1] Pyelonephritis [N12] Renal abscess, left [N15.1] Pyelonephritis of left kidney [N12]   Discharge Diagnosis  Renal abscess [N15.1] Pyelonephritis [N12] Renal abscess, left [N15.1] Pyelonephritis of left kidney [N12]    Principal Problem:   Pyelonephritis of left kidney Active Problems:   Seizures (HCC)   GERD (gastroesophageal reflux disease)   Intellectual disability   Generalized convulsive epilepsy ([place])   DNR (do not resuscitate) discussion   Seizure disorder ([place])   Frequent falls   Renal abscess   Leukocytosis   Fever   LLQ abdominal pain   Dysphagia   AKI (acute kidney injury) ([place])   Sepsis secondary to UTI [facility])   Palliative care by specialist   Pressure injury of skin      Past Medical History:  Diagnosis Date  . Constipation   . Dysphagia   . Fracture    R foot  . GERD (gastroesophageal reflux disease)   . History of shingles 10/2016  . Mental retardation    lesion in head  . Seizures ([place])    "not fully controlled on max doses of meds" (Neuro ofc note 12/2014)  . Thrombocytopenia ([place])   . Tremor     Past Surgical History:  Procedure Laterality Date  . BIOPSY  09/16/2014   Procedure: BIOPSY;  Surgeon: Rogene Houston, MD;  Location: [facility] ORS;  Service: Endoscopy;;  . CATARACT EXTRACTION     both eyes, May of 2015  . COLONOSCOPY    . COLONOSCOPY WITH PROPOFOL N/A 11/20/2018   Procedure: COLONOSCOPY WITH PROPOFOL;  Surgeon: Rogene Houston, MD;  Location: [facility] ENDO SUITE;  Service: Endoscopy;  Laterality: N/A;  . ESOPHAGEAL DILATION N/A 09/16/2014   Procedure: ESOPHAGEAL DILATION WITH 54FR MALONEY DILATOR;  Surgeon: Rogene Houston, MD;  Location: [facility] ORS;  Service: Endoscopy;  Laterality: N/A;  . ESOPHAGOGASTRODUODENOSCOPY (EGD) WITH PROPOFOL N/A 09/16/2014   Procedure: ESOPHAGOGASTRODUODENOSCOPY (EGD) WITH PROPOFOL;  Surgeon: Rogene Houston, MD;  Location: [facility] ORS;  Service: Endoscopy;  Laterality: N/A;  . MOUTH SURGERY    . POLYPECTOMY  11/20/2018   Procedure: POLYPECTOMY;  Surgeon: Rogene Houston, MD;  Location: [facility] SU[facility];  Service: Endoscopy;;  colon  . Skin graft to gum Right 08/2013  . TONSILLECTOMY AND ADENOIDECTOMY    . TOTAL ABDOMINAL HYSTERECTOMY         History of present illness and  Hospital Course:     Kindly see H&P for history of present illness and admission details, please review complete Labs, Consult reports and Test reports for all  details in brief  HPI  from the history and physical done on the day of admission 04/14/2020  HPI: Maria [name] is a 59 y.o. female with medical history significant for chronic constipation, epilepsy, intellectual disability, GERD, dysphagia, tremors who has been dealing with problems with left lower quadrant abdominal pain for several weeks that started out as intermittent and has progressively worsened.  She was seen in the ED recently and diagnosed with a vaginal infection.  She completed a course of metronidazole.  She continued to complain of left lower quadrant abdominal pain and discomfort.  She has had poor appetite and poor oral intake over the last 3 days.  She is having hard bowel movements.  She also has been having fever and her mentation has changed from baseline.  Her family says that she rarely complains of pain but has recently been pointing to the left lower quadrant with grimacing and discomfort.  Her family brought her to the emergency department  last night but left after 3 hours without being seen.  They tried to get her to her PCP office today however they were not able to any but the patient had increasing weakness and pain discomfort and EMS brought the patient to the emergency department.  ED Course: Patient arrived appearing acutely ill with fever 100.4 axillary, tachycardic with heart rate around 110.  Her blood pressure was 103/68.  She had good oxygen saturation on room air.  Sodium was 137, potassium 3.6, BUN 27, creatinine 1.67, AST 13, ALT 18, estimated GFR 35, lactic acid 1.3, WBC 15.0, hemoglobin 12.7, platelet count 126.  Urinalysis positive for large leukocytes, protein, bacteria and greater than 50 WBC per high-power field.  Unfortunately antibiotics have been given prior to blood cultures being drawn.  CT abdomen/pelvis with findings suspicious for left pyelonephritis with a small left renal abscess.  Patient was started on antibiotics and IV fluid hydration and admission was requested.  Pt became acutely agitated while holding in ED and required IV haldol for sedation.   Hospital Course   Maria [name] is a 59 year old female with past medical history significant for epilepsy, intellectual delay, GERD, dysphagia/esophageal stricture, chronic diastolic congestive heart failure who was admitted on 04/14/2020 and transferred from [facility] to [facility], ICU on 04/16/2020 with septic shock secondary to E. coli UTI/left pyelonephritis complicated by breakthrough seizures.  Patient has been weaned from vasopressors and completed antibiotic course for E. coli/pyelonephritis septic shock.  Patient with significant ICU delirium requiring Precedex drip, she has been transitioned to Seroquel where her mentation is currently acceptable delirium has resolved. Continues with waxing and waning mentation and dysphagia, requiring tube feeds for nutrition, has improved passed her swallow evaluation, currently on dysphagia 3  diet. Transferred from PCCM service to hospitalist service on 04/29/2020.  11/12 admitted w/ septic shock and acute TME 11/15 PICC and CorTrak placed.  11/16 off pressors. Still agitated and delirious. On mono rx for ecoli 11/18 Seizure loaded on keppra 11/27 Transfered to medical floor   Septic shock due E. coli UTI and Left pyelonephritis, present on admission. - Patient initially presenting to the hospital with left lower quadrant abdominal pain and found to have a urinary tract infection with sepsis and was admitted for IV antibiotic treatment.  During her initial course, her blood pressure continued to decline and ultimately required transfer to [facility], ICU under the pulmonary critical care service for need of vasopressors.  CT abdomen/pelvis with findings suspicious for left-sided pyelonephritis  and small abscess lower pole of left kidney.  Urine culture positive for E. coli.  Patient initially started on azithromycin and Zosyn and completed course of ceftriaxone.  Patient was eventually titrated off of vasopressors and repeat urinalysis on 04/26/2020 with no growth. -She is afebrile, nontoxic-appearing, cytosis trending down, procalcitonin within normal limit . -Sepsis has resolved  Epilepsy with breakthrough seizures During initial hospitalization, patient developed seizure activity.  Etiology likely provoked from acute infectious process as above.  Patient was seen by neurology and started on her home antiepileptics.  She was monitored on EEG with no further seizure activity appreciated and generalized spike and wave discharges were likely consistent with patient's known history of generalized epilepsy.  Neurology signed off on 04/23/2020 with no further inpatient work-up indicated with recommendations of follow-up with outpatient neurology. --valproic acid 500 mg BID  --Vimpat 200mg  BID  -Klonopin has been increased to 1 mg 3 times daily --Seizure precautions --Ativan IV as  needed breakthrough seizure  Dysphagia Hx esophageal stricture Patient follows with gastroenterology outpatient, Dr. Laural Golden.  Has required several esophageal dilations for strictures in the past. --SLP following, appreciate assistance on dysphagia 3 thin liquid. --Patient did well with barium swallow on 05/02/2020  Concern for left kidney abscess versus cyst Initial CT abdomen/pelvis notable for left kidney lower pole abscess measuring 2.5 x 2.4 cm.  Follow-up renal ultrasound 04/25/2020 shows stable hypoechoic lesion lower pole left kidney consistent with small perinephric abscess versus renal cyst.  Have discussed with urology Dr. Junious Silk, apparently this is an old cyst present even on previous CT scan in 2014 with no changes.  This is most likely related to old cyst, no further work-up is indicated at this point.  Moderate protein calorie malnutrition Body mass index is 21.99 kg/m. Nutrition Status: Nutrition Problem: Inadequate oral intake Etiology: lethargy/confusion Signs/Symptoms: NPO status Interventions: Tube feeding --SLP/nutrition continues to follow, barium swallow 05/02/2020 without signs of aspiration --Discontinue core track and tube feeds today  Chronic diastolic congestive heart failure TTE with LVEF 60 to 46%, grade 2 diastolic dysfunction, circumferential pericardial effusion, IVC normal in size, mild MR, no aortic stenosis. --Metoprolol 12.5 mg p.o. twice daily  Intellectual delay --Get up during the day to bedside chair --Encourage a familiar face to remain present throughout the day --Keep blinds open and lights on during daylight hours --Minimize the use of opioids --Seroquel 75 mg daily, 150 mg nightly --Diazepam 2 mg qHS --Clonazepam 1 mg TID  HLD: Continue atorvastatin 20 mg daily  GERD: Protonix 40 mg daily  Weakness/debility/deconditioning: --Continue PT/OT efforts while inpatient --Currently recommending  CIR   Discharge Condition:   Stable    Discharge Instructions  and  Discharge Medications     Discharge Instructions    Discharge instructions   Complete by: As directed    Management per CIR   Increase activity slowly   Complete by: As directed    No wound care   Complete by: As directed      Allergies as of 05/05/2020      Reactions   Codeine Nausea And Vomiting   Lamictal [lamotrigine] Rash      Medication List    STOP taking these medications   ibuprofen 200 MG tablet Commonly known as: ADVIL   levocetirizine 5 MG tablet Commonly known as: XYZAL     TAKE these medications   acetaminophen 500 MG tablet Commonly known as: TYLENOL Take 1 tablet (500 mg total) by mouth every 6 (six) hours as needed  for moderate pain.   atorvastatin 20 MG tablet Commonly known as: LIPITOR Take 20 mg by mouth daily.   clonazePAM 1 MG tablet Commonly known as: KLONOPIN Take 1 tablet (1 mg total) by mouth 3 (three) times daily. What changed:   medication strength  how much to take  when to take this   diazepam 2 MG tablet Commonly known as: VALIUM Take 1 tablet (2 mg total) by mouth at bedtime.   divalproex 500 MG 24 hr tablet Commonly known as: DEPAKOTE ER Take 1 tablet (500 mg total) by mouth 2 (two) times daily.   docusate sodium 100 MG capsule Commonly known as: COLACE Take 1 capsule (100 mg total) by mouth 2 (two) times daily.   feeding supplement Liqd Take 237 mLs by mouth 3 (three) times daily between meals.   ipratropium-albuterol 0.5-2.5 (3) MG/3ML Soln Commonly known as: DUONEB Take 3 mLs by nebulization every 4 (four) hours as needed.   lacosamide 200 MG Tabs tablet Commonly known as: VIMPAT Take 1 tablet (200 mg total) by mouth 2 (two) times daily.   metoprolol tartrate 25 MG tablet Commonly known as: LOPRESSOR Take 1 tablet (25 mg total) by mouth 2 (two) times daily.   omeprazole 40 MG capsule Commonly known as: PRILOSEC Take 1 capsule (40 mg total) by mouth daily.    oxybutynin 5 MG 24 hr tablet Commonly known as: DITROPAN-XL Take 5 mg by mouth at bedtime.   pediatric multivitamin chewable tablet Chew 1 tablet by mouth daily.   QUEtiapine 50 MG tablet Commonly known as: SEROQUEL Take 3 tablets (150 mg total) by mouth at bedtime.   QUEtiapine 25 MG tablet Commonly known as: SEROQUEL Take 3 tablets (75 mg total) by mouth daily.         Diet and Activity recommendation: See Discharge Instructions above   Consults obtained -  PCCM Neurology   Major procedures and Radiology Reports - PLEASE review detailed and final reports for all details, in brief -      EEG  Result Date: 04/20/2020 Lora Havens, MD     04/21/2020  8:41 AM Patient Name: ARRIAH WADLE MRN: [id number] Epilepsy Attending: Lora Havens Referring Physician/Provider: Dr Ina Homes Date: 04/20/2020 Duration: 23.40 mins Patient history: 59yo F with h/o epilepsy now with ams. EEG to evaluate for seizure Level of alertness: Lethargic AEDs during EEG study: LCM, VPA, Clonopin Technical aspects: This EEG study was done with scalp electrodes positioned according to the 10-20 International system of electrode placement. Electrical activity was acquired at a sampling rate of 500Hz  and reviewed with a high frequency filter of 70Hz  and a low frequency filter of 1Hz . EEG data were recorded continuously and digitally stored. Description: EEG showed continuous generalized 3 to 6 Hz theta-delta slowing. Patient was noted to have two episodes at 1042 and 1045 of sudden arm and leg elevation followed by jerking briefly for 2-3 seconds. Concomitant eeg showed generalized polyspikes lasting 10-12 seconds. Generalized spike and wave discharges were also noted, at times lasting 5-7seconds consistent with brief-ictal-interictal rhythmic discharges ( BIRDS). Hyperventilation and photic stimulation were not performed.   ABNORMALITY - Seizure, generalized - BIRDs - Spike and wave, generalized -  Continuous slow, generalized IMPRESSION: This study showed two seizures at 1042 and 1045 during which patient was noted to have sudden arm and leg elevation followed by jerking for 2 to 3 seconds with generalized onset.  Additionally there is evidence of severe diffuse encephalopathy, likely secondary to seizures.  Dr. Ina Homes was immediately notified. Lora Havens   DG Chest 2 View  Result Date: 05/02/2020 CLINICAL DATA:  Cough EXAM: CHEST - 2 VIEW COMPARISON:  Film from the previous day. FINDINGS: Cardiac shadow is within normal limits. Feeding catheter is noted extending into the stomach. Right-sided PICC line is noted in satisfactory position. The lungs are well aerated without focal infiltrate or effusion. No bony abnormality is seen. IMPRESSION: No acute abnormality noted. Electronically Signed   By: Inez Catalina M.D.   On: 05/02/2020 00:15   DG Abd 1 View  Result Date: 05/01/2020 CLINICAL DATA:  Abdominal pain. EXAM: ABDOMEN - 1 VIEW COMPARISON:  April 21, 2020. FINDINGS: The bowel gas pattern is normal. Distal tip of feeding tube is seen in expected position of distal stomach. No radio-opaque calculi or other significant radiographic abnormality are seen. IMPRESSION: No evidence of bowel obstruction or ileus. Electronically Signed   By: Marijo Conception M.D.   On: 05/01/2020 10:53   DG Abd 1 View  Result Date: 04/21/2020 CLINICAL DATA:  59 year old female with hypoxia. Feeding tube placement. EXAM: ABDOMEN - 1 VIEW COMPARISON:  CT Abdomen and Pelvis 04/14/2020. FINDINGS: Portable AP supine view at 1012 hours. Enteric feeding tube tip is at the level of the distal gastric body. Bowel-gas pattern remains within normal limits. No acute osseous abnormality identified. IMPRESSION: Enteric feeding tube tip at the level of the distal gastric body. If post pyloric placement is desired recommend advancement of 6-8 cm to allow post pyloric transit. Electronically Signed   By: Genevie Ann M.D.    On: 04/21/2020 11:09   CT Abdomen Pelvis W Contrast  Result Date: 04/14/2020 CLINICAL DATA:  Left lower quadrant pain. EXAM: CT ABDOMEN AND PELVIS WITH CONTRAST TECHNIQUE: Multidetector CT imaging of the abdomen and pelvis was performed using the standard protocol following bolus administration of intravenous contrast. CONTRAST:  151mL OMNIPAQUE IOHEXOL 300 MG/ML  SOLN COMPARISON:  05/26/2015 FINDINGS: Lower Chest: No acute findings. Hepatobiliary: No hepatic masses identified. Gallbladder is unremarkable. No evidence of biliary ductal dilatation. Pancreas:  No mass or inflammatory changes. Spleen: Within normal limits in size and appearance. Adrenals/Urinary Tract: Normal adrenal glands. Stable tiny sub-cm cyst in upper pole of right kidney. Mild diffuse left renal parenchymal atrophy and scarring are again seen. New ill-defined area of decreased enhancement is seen in the lower pole the left kidney with associated perinephric soft tissue stranding, suspicious for pyelonephritis. A poorly defined fluid attenuation lesion is seen in the lower pole of the left kidney which measures 2.5 x 2.4 cm, suspicious for small renal abscess. No evidence of ureteral calculi or dilatation. Unremarkable unopacified urinary bladder. Stomach/Bowel: No evidence of obstruction, inflammatory process or abnormal fluid collections. Normal appendix visualized. Vascular/Lymphatic: No pathologically enlarged lymph nodes. No abdominal aortic aneurysm. Aortic atherosclerotic calcification noted. Reproductive: Prior hysterectomy. Stable small benign-appearing cystic lesion in the left adnexa measuring 2.9 x 1.2 cm, consistent with benign etiology. No other pelvic masses identified. No evidence of ascites. Other:  None. Musculoskeletal:  No suspicious bone lesions identified. IMPRESSION: Findings suspicious for pyelonephritis and small abscess in lower pole of left kidney. Suggest correlation with urinalysis, and recommend continued  follow-up by CT to confirm resolution. No evidence of ureteral calculi or hydronephrosis. Aortic Atherosclerosis (ICD10-I70.0). Electronically Signed   By: Marlaine Hind M.D.   On: 04/14/2020 12:14   US RENAL  Result Date: 04/25/2020 CLINICAL DATA:  Perinephric abscess. EXAM: RENAL / URINARY TRACT ULTRASOUND COMPLETE COMPARISON:  April 14, 2020 FINDINGS: Right Kidney: Renal measurements: 11.1 cm x 6.1 cm x 5.6 cm = volume: 197.1 mL. Echogenicity within normal limits. No mass or hydronephrosis visualized. Left Kidney: Renal measurements: 10.3 cm x 5.3 cm x 4.3 cm = volume: 123.5 mL. Echogenicity within normal limits. A stable 2.5 cm x 2.4 cm x 2.3 cm hypoechoic lesion is seen within the lower pole of the left kidney. While this is seen on the prior ultrasound and corresponds to an area of abnormality noted on the prior abdomen and pelvis CT (April 14, 2020), it should be noted that a small cyst is seen within the lower pole of the left kidney on the abdomen and pelvis CT dated May 26, 2015 and December 16, 2012. No abnormal flow is seen within this region on color Doppler evaluation. No hydronephrosis is visualized. Bladder: Appears normal for degree of bladder distention. Other: None. IMPRESSION: Stable hypoechoic lesion within the lower pole of the left kidney. While this may represent a small perinephric abscess, as suggested on the prior study, and infected renal cyst cannot be excluded. Electronically Signed   By: Virgina Norfolk M.D.   On: 04/25/2020 17:37   US RENAL  Result Date: 04/14/2020 CLINICAL DATA:  Left lower quadrant pain. Recent urinary tract infection. Findings concerning for left-sided pyelonephritis and lower pole renal abscess on CT. EXAM: RENAL / URINARY TRACT ULTRASOUND COMPLETE COMPARISON:  CT abdomen and pelvis 04/14/2020 FINDINGS: Examination was limited by the patient's mental status and bowel gas. Right Kidney: Renal measurements: 11.2 x 4.7 x 6.4 cm = volume: 175 mL.  Echogenicity within normal limits. No mass or hydronephrosis visualized. Left Kidney: Renal measurements: 11.0 x 5.0 x 6.2 cm = volume: 177 mL. No hydronephrosis. 2.5 x 2.2 cm hypoechoic to anechoic nonvascular structure along the lower pole corresponding to the abnormality on today's earlier CT. Bladder: Appears normal for degree of bladder distention. Other: None. IMPRESSION: 1. 2.5 cm structure along the lower pole of the left kidney suspicious for a small abscess given the ultrasound and CT appearance. 2. Unremarkable appearance of the right kidney. Electronically Signed   By: Logan Bores M.D.   On: 04/14/2020 16:23   DG CHEST PORT 1 VIEW  Result Date: 05/01/2020 CLINICAL DATA:  Cough. EXAM: PORTABLE CHEST 1 VIEW COMPARISON:  April 26, 2020. FINDINGS: Improved opacities in the right lung. No visible pleural effusions or pneumothorax. Cardiac silhouette is within normal limits. Enteric tube courses below the diaphragm in outside the field of view. Right subclavian approach PICC with the tip projecting at the right atrium. No acute osseous abnormality. IMPRESSION: Improved opacities in right lung, suggesting improving pneumonia. Electronically Signed   By: Margaretha Sheffield MD   On: 05/01/2020 10:56   DG CHEST PORT 1 VIEW  Result Date: 04/26/2020 CLINICAL DATA:  Cough. EXAM: PORTABLE CHEST 1 VIEW COMPARISON:  April 24, 2020. FINDINGS: The heart size and mediastinal contours are within normal limits. Feeding tube is seen entering stomach. Right-sided PICC line is unchanged. No pneumothorax is noted. Stable right lung opacities are noted concerning for pneumonia. Minimal left basilar subsegmental atelectasis is noted. The visualized skeletal structures are unremarkable. IMPRESSION: Stable right lung opacities are noted concerning for pneumonia. Minimal left basilar subsegmental atelectasis is noted. Electronically Signed   By: Marijo Conception M.D.   On: 04/26/2020 09:35   DG CHEST PORT 1  VIEW  Result Date: 04/24/2020 CLINICAL DATA:  Hypoxia. EXAM: PORTABLE CHEST 1 VIEW COMPARISON:  One-view chest  x-ray 04/22/2020 FINDINGS: Heart size is normal. Interstitial and airspace opacities bilaterally are proving. Overall lung volumes are improving. Small bore feeding tube courses off the inferior border the film. Right-sided PICC line is stable. IMPRESSION: Improving aeration of both lungs with decreasing interstitial and airspace opacities. Electronically Signed   By: San Morelle M.D.   On: 04/24/2020 05:11   DG Chest Port 1 View  Result Date: 04/22/2020 CLINICAL DATA:  Acute respiratory failure, aspiration airway. EXAM: PORTABLE CHEST 1 VIEW COMPARISON:  April 21, 2020. FINDINGS: Bibasilar opacities. Left basilar consolidation silhouetting left hemidiaphragm, slightly progressed. No visible pleural effusions or pneumothorax. Similar cardiomediastinal silhouette. Right PICC tip projects at the right atrium, unchanged. Enteric tube courses below the diaphragm outside the field of view. No acute osseous abnormality. IMPRESSION: Bibasilar airspace opacities with slightly progressed dense consolidation at the left lung base. Findings are concerning for pneumonia and/or aspiration. Electronically Signed   By: Margaretha Sheffield MD   On: 04/22/2020 09:12   DG CHEST PORT 1 VIEW  Result Date: 04/21/2020 CLINICAL DATA:  58 year old female with hypoxia. EXAM: PORTABLE CHEST 1 VIEW COMPARISON:  Portable chest 04/16/2020 and earlier. FINDINGS: Portable AP semi upright view at 1043 hours. Right PICC line and enteric feeding tube and been placed. PICC line tip projects at the right atrium. Feeding tube courses into the left abdomen, tip not included. No endotracheal tube identified. Lower lung volumes with increasing bibasilar opacity including some retrocardiac air bronchograms now. However, right central lung and perihilar ventilation has improved. No superimposed pneumothorax or pulmonary  edema. No definite pleural effusion. No acute osseous abnormality identified. Paucity of bowel gas in the upper abdomen. IMPRESSION: 1. Lower lung volumes and increasing bibasilar opacity, left lower lobe consolidation suspected now. 2. However, right mid lung and perihilar ventilation has improved. 3. Right PICC line and enteric feeding tube placed. Electronically Signed   By: Genevie Ann M.D.   On: 04/21/2020 11:08   DG CHEST PORT 1 VIEW  Result Date: 04/16/2020 CLINICAL DATA:  Hypoxia EXAM: PORTABLE CHEST 1 VIEW COMPARISON:  September 04, 2017 FINDINGS: There is airspace opacity in each mid and lower lung region, most notable in the right mid lung region. Heart is borderline enlarged with pulmonary vascularity normal. No adenopathy. There are foci of aortic atherosclerosis. No pneumothorax. No bone lesions. IMPRESSION: Airspace opacity at multiple sites, most severe in the right mid lung. Appearance felt to be indicative of multifocal pneumonia. This finding may warrant correlation with COVID-19 status. Borderline cardiac enlargement.  No adenopathy. Aortic Atherosclerosis (ICD10-I70.0). Electronically Signed   By: Lowella Grip III M.D.   On: 04/16/2020 11:28   DG Swallowing Func-Speech Pathology  Result Date: 05/02/2020 Objective Swallowing Evaluation: Type of Study: MBS-Modified Barium Swallow Study  Patient Details Name: JUSTICE AGUIRRE MRN: [id number] Date of Birth: 03/22/1961 Today's Date: 05/02/2020 Time: SLP Start Time (ACUTE ONLY): 1330 -SLP Stop Time (ACUTE ONLY): 1400 SLP Time Calculation (min) (ACUTE ONLY): 30 min Past Medical History: Past Medical History: Diagnosis Date . Constipation  . Dysphagia  . Fracture   R foot . GERD (gastroesophageal reflux disease)  . History of shingles 10/2016 . Mental retardation   lesion in head . Seizures ([place])   "not fully controlled on max doses of meds" (Neuro ofc note 12/2014) . Thrombocytopenia ([place])  . Tremor  Past Surgical History: Past Surgical History:  Procedure Laterality Date . BIOPSY  09/16/2014  Procedure: BIOPSY;  Surgeon: Rogene Houston, MD;  Location: [facility];  Service: Endoscopy;; . CATARACT EXTRACTION    both eyes, May of 2015 . COLONOSCOPY   . COLONOSCOPY WITH PROPOFOL N/A 11/20/2018  Procedure: COLONOSCOPY WITH PROPOFOL;  Surgeon: Rogene Houston, MD;  Location: [facility];  Service: Endoscopy;  Laterality: N/A; . ESOPHAGEAL DILATION N/A 09/16/2014  Procedure: ESOPHAGEAL DILATION WITH 54FR MALONEY DILATOR;  Surgeon: Rogene Houston, MD;  Location: [facility];  Service: Endoscopy;  Laterality: N/A; . ESOPHAGOGASTRODUODENOSCOPY (EGD) WITH PROPOFOL N/A 09/16/2014  Procedure: ESOPHAGOGASTRODUODENOSCOPY (EGD) WITH PROPOFOL;  Surgeon: Rogene Houston, MD;  Location: [facility];  Service: Endoscopy;  Laterality: N/A; . MOUTH SURGERY   . POLYPECTOMY  11/20/2018  Procedure: POLYPECTOMY;  Surgeon: Rogene Houston, MD;  Location: [facility];  Service: Endoscopy;;  colon . Skin graft to gum Right 08/2013 . TONSILLECTOMY AND ADENOIDECTOMY   . TOTAL ABDOMINAL HYSTERECTOMY   HPI: 59 yo admitted 11/12 with sepsis, pyelonephritis and encephalopathy. PMhx: epilepsy, intellectual disability, GERD, EGD with dilation 2016.  Initial limited clinical swallow eval completed 11/17 with recs to continue NPO.  SLP s/o at that time due to ongoing lethargy.  Cortrak for nutrition.  Has been having breakthrough seizures.  Improved mentation, diet has been advanced slowly as she has improved.  Subjective: alert, very confused Assessment / Plan / Recommendation CHL IP CLINICAL IMPRESSIONS 05/02/2020 Clinical Impression Pt presents with quite functional swallow.  She demonstrated some initial reluctance to eat the solid/pureed barium, which caused hesitation while eating, requiring prompts to chew and swallow (this creates what appears to be an oral delay on the imaging).  She demonstrated reliable laryngeal vestibule closure across all trials, excluding one episode of penetration of thin  liquids (considered WFL). There was no aspiration, despite large, successive boluses of thin and mixed solid/thin liquid consistencies.  There was trace vallecular residue, insignificant.  Recommend advancing diet to a dysphagia 3, thin liquids and D/Cing the cortrak.  Pt will need encouragement and assistance to feed herself - recommend removing the mitts as much as able (which should be more feasible with cortrak removed) to encourage independence.  SLP will follow briefly to ensure safety.  Sent a message via Secure Chat to Dr. [place] and spoke with pt's RN.  SLP Visit Diagnosis Dysphagia, unspecified (R13.10) Attention and concentration deficit following -- Frontal lobe and executive function deficit following -- Impact on safety and function --   CHL IP TREATMENT RECOMMENDATION 05/02/2020 Treatment Recommendations Therapy as outlined in treatment plan below   Prognosis 05/02/2020 Prognosis for Safe Diet Advancement Good Barriers to Reach Goals -- Barriers/Prognosis Comment -- [facility] IP DIET RECOMMENDATION 05/02/2020 SLP Diet Recommendations Dysphagia 3 (Mech soft) solids;Thin liquid Liquid Administration via Cup;Straw Medication Administration Whole meds with puree Compensations Minimize environmental distractions Postural Changes --   CH[facility] IP OTHER RECOMMENDATIONS 05/02/2020 Recommended Consults -- Oral Care Recommendations Oral care BID Other Recommendations --   [facility] IP FOLLOW UP RECOMMENDATIONS 05/01/2020 Follow up Recommendations Skilled Nursing facility   [facility] IP FREQUENCY AND DURATION 05/02/2020 Speech Therapy Frequency (ACUTE ONLY) min 2x/week Treatment Duration 1 week      CHL IP ORAL PHASE 05/02/2020 Oral Phase WFL Oral - Pudding Teaspoon -- Oral - Pudding Cup -- Oral - Honey Teaspoon -- Oral - Honey Cup -- Oral - Nectar Teaspoon -- Oral - Nectar Cup -- Oral - Nectar Straw -- Oral - Thin Teaspoon -- Oral - Thin Cup -- Oral - Thin Straw -- Oral - Puree -- Oral - Mech Soft -- Oral - Regular --  Oral -  Multi-Consistency -- Oral - Pill -- Oral Phase - Comment --  CHL IP PHARYNGEAL PHASE 05/02/2020 Pharyngeal Phase WFL Pharyngeal- Pudding Teaspoon -- Pharyngeal -- Pharyngeal- Pudding Cup -- Pharyngeal -- Pharyngeal- Honey Teaspoon -- Pharyngeal -- Pharyngeal- Honey Cup -- Pharyngeal -- Pharyngeal- Nectar Teaspoon -- Pharyngeal -- Pharyngeal- Nectar Cup -- Pharyngeal -- Pharyngeal- Nectar Straw -- Pharyngeal -- Pharyngeal- Thin Teaspoon -- Pharyngeal -- Pharyngeal- Thin Cup -- Pharyngeal -- Pharyngeal- Thin Straw -- Pharyngeal -- Pharyngeal- Puree -- Pharyngeal -- Pharyngeal- Mechanical Soft -- Pharyngeal -- Pharyngeal- Regular -- Pharyngeal -- Pharyngeal- Multi-consistency -- Pharyngeal -- Pharyngeal- Pill -- Pharyngeal -- Pharyngeal Comment --  No flowsheet data found. [name] 05/02/2020, 2:59 PM              Overnight EEG with video  Result Date: 04/21/2020 Lora Havens, MD     04/22/2020  9:11 AM Patient Name: SOLEIL MAS MRN: [id number] Epilepsy Attending: Lora Havens Referring Physician/Provider: Dr Zeb Comfort Duration: 04/20/2020 1109 to 04/21/2020 0400, 2585- 1600  Patient history: 59yo F with h/o epilepsy now with ams. EEG to evaluate for seizure  Level of alertness: Lethargic  AEDs during EEG study: LCM, VPA, Clonopin, LEV  Technical aspects: This EEG study was done with scalp electrodes positioned according to the 10-20 International system of electrode placement. Electrical activity was acquired at a sampling rate of 500Hz  and reviewed with a high frequency filter of 70Hz  and a low frequency filter of 1Hz . EEG data were recorded continuously and digitally stored.  Description: EEG showed continuous generalized 3 to 6 Hz theta-delta slowing. Generalized spike and wave discharges were also noted, at times lasting 5-7seconds consistent with brief-ictal-interictal rhythmic discharges ( BIRDS).   Event button was pressed on 04/20/2020 at 2154. Patient was noted to  have sudden arm and leg elevation followed by jerking briefly for 2-3 seconds. Concomitant eeg showed generalized polyspikes lasting 10-12 seconds.  ABNORMALITY - Seizure, generalized - BIRDs - Spike and wave, generalized - Continuous slow, generalized  IMPRESSION: This study showed one seizure on 04/20/2020 at 2154 during which patient was noted to have sudden arm and leg elevation followed by jerking for 2 to 3 seconds with generalized onset.  Additionally there is evidence of moderate to severe diffuse encephalopathy, likely secondary to seizures.  Lora Havens   ECHOCARDIOGRAM COMPLETE  Result Date: 04/17/2020    ECHOCARDIOGRAM REPORT   Patient Name:   KATEENA DEGROOTE Date of Exam: 04/17/2020 Medical Rec #:  [id number]       Height:       66.0 in Accession #:    [id number]      Weight:       147.9 lb Date of Birth:  08-01-1960       BSA:          1.759 m Patient Age:    86 years        BP:           118/69 mmHg Patient Gender: F               HR:           73 bpm. Exam Location:  Inpatient Procedure: 2D Echo Indications:    acute respiratory insufficiency 518.82  History:        Patient has prior history of Echocardiogram examinations, most                 recent 03/16/2014.  Sonographer:  Johny Chess Referring Phys: [id number] Collier Bullock  Sonographer Comments: Image acquisition challenging due to uncooperative patient. IMPRESSIONS  1. Left ventricular ejection fraction, by estimation, is 60 to 65%. The left ventricle has normal function. The left ventricle has no regional wall motion abnormalities. Left ventricular diastolic parameters are consistent with Grade II diastolic dysfunction (pseudonormalization).  2. Right ventricular systolic function is normal. The right ventricular size is normal. There is normal pulmonary artery systolic pressure.  3. The pericardial effusion is circumferential.  4. The mitral valve is normal in structure. Mild mitral valve regurgitation. No evidence of  mitral stenosis.  5. The aortic valve is normal in structure. Aortic valve regurgitation is not visualized. No aortic stenosis is present.  6. The inferior vena cava is normal in size with greater than 50% respiratory variability, suggesting right atrial pressure of 3 mmHg. FINDINGS  Left Ventricle: Left ventricular ejection fraction, by estimation, is 60 to 65%. The left ventricle has normal function. The left ventricle has no regional wall motion abnormalities. The left ventricular internal cavity size was normal in size. There is  no left ventricular hypertrophy. Left ventricular diastolic parameters are consistent with Grade II diastolic dysfunction (pseudonormalization). Right Ventricle: The right ventricular size is normal. No increase in right ventricular wall thickness. Right ventricular systolic function is normal. There is normal pulmonary artery systolic pressure. The tricuspid regurgitant velocity is 2.86 m/s, and  with an assumed right atrial pressure of 3 mmHg, the estimated right ventricular systolic pressure is 06.2 mmHg. Left Atrium: Left atrial size was normal in size. Right Atrium: Right atrial size was normal in size. Pericardium: Trivial pericardial effusion is present. The pericardial effusion is circumferential. Mitral Valve: The mitral valve is normal in structure. Mild mitral valve regurgitation. No evidence of mitral valve stenosis. Tricuspid Valve: The tricuspid valve is normal in structure. Tricuspid valve regurgitation is trivial. No evidence of tricuspid stenosis. Aortic Valve: The aortic valve is normal in structure. Aortic valve regurgitation is not visualized. No aortic stenosis is present. Pulmonic Valve: The pulmonic valve was normal in structure. Pulmonic valve regurgitation is not visualized. No evidence of pulmonic stenosis. Aorta: The aortic root is normal in size and structure. Venous: The inferior vena cava is normal in size with greater than 50% respiratory variability,  suggesting right atrial pressure of 3 mmHg. IAS/Shunts: No atrial level shunt detected by color flow Doppler.  LEFT VENTRICLE PLAX 2D LVIDd:         3.30 cm  Diastology LVIDs:         2.40 cm  LV e' medial:    8.05 cm/s LV PW:         0.90 cm  LV E/e' medial:  15.2 LV IVS:        0.90 cm  LV e' lateral:   8.49 cm/s LVOT diam:     1.80 cm  LV E/e' lateral: 14.4 LV SV:         52 LV SV Index:   30 LVOT Area:     2.54 cm  RIGHT VENTRICLE             IVC RV S prime:     12.10 cm/s  IVC diam: 1.00 cm TAPSE (M-mode): 1.9 cm LEFT ATRIUM             Index       RIGHT ATRIUM          Index LA diam:        2.80  cm 1.59 cm/m  RA Area:     9.49 cm LA Vol (A2C):   23.7 ml 13.47 ml/m RA Volume:   19.30 ml 10.97 ml/m LA Vol (A4C):   26.9 ml 15.29 ml/m LA Biplane Vol: 25.3 ml 14.38 ml/m  AORTIC VALVE LVOT Vmax:   98.80 cm/s LVOT Vmean:  66.800 cm/s LVOT VTI:    0.206 m  AORTA Ao Root diam: 2.70 cm Ao Asc diam:  2.60 cm MITRAL VALVE                TRICUSPID VALVE MV Area (PHT): 5.02 cm     TR Peak grad:   32.7 mmHg MV Decel Time: 151 msec     TR Vmax:        286.00 cm/s MV E velocity: 122.00 cm/s MV A velocity: 68.60 cm/s   SHUNTS MV E/A ratio:  1.78         Systemic VTI:  0.21 m                             Systemic Diam: 1.80 cm Candee Furbish MD Electronically signed by Candee Furbish MD Signature Date/Time: 04/17/2020/11:41:09 AM    Final    VAS [place] LOWER EXTREMITY VENOUS (DVT)  Result Date: 04/26/2020  Lower Venous DVT Study Indications: Fever.  Limitations: PT w/ intellectual disability and altered mental status. Agitated state, PT unable to remain stationary during exam. Comparison Study: No prior studies. Performing Technologist: Darlin Coco, RDMS  Examination Guidelines: A complete evaluation includes B-mode imaging, spectral Doppler, color Doppler, and power Doppler as needed of all accessible portions of each vessel. Bilateral testing is considered an integral part of a complete examination. Limited examinations for  reoccurring indications may be performed as noted. The reflux portion of the exam is performed with the patient in reverse Trendelenburg.  +---------+---------------+---------+-----------+----------+--------------+ RIGHT    CompressibilityPhasicitySpontaneityPropertiesThrombus Aging +---------+---------------+---------+-----------+----------+--------------+ CFV      Full           Yes      Yes                                 +---------+---------------+---------+-----------+----------+--------------+ SFJ      Full                                                        +---------+---------------+---------+-----------+----------+--------------+ FV Prox  Full                                                        +---------+---------------+---------+-----------+----------+--------------+ FV Mid   Full                                                        +---------+---------------+---------+-----------+----------+--------------+ FV DistalFull                                                        +---------+---------------+---------+-----------+----------+--------------+  PFV      Full                                                        +---------+---------------+---------+-----------+----------+--------------+ POP      Full           Yes      Yes                                 +---------+---------------+---------+-----------+----------+--------------+ PTV      Full                                                        +---------+---------------+---------+-----------+----------+--------------+ PERO     Full                                                        +---------+---------------+---------+-----------+----------+--------------+   +---------+---------------+---------+-----------+----------+-------------------+ LEFT     CompressibilityPhasicitySpontaneityPropertiesThrombus Aging       +---------+---------------+---------+-----------+----------+-------------------+ CFV      Full           Yes      Yes                                      +---------+---------------+---------+-----------+----------+-------------------+ SFJ      Full                                                             +---------+---------------+---------+-----------+----------+-------------------+ FV Prox  Full                                                             +---------+---------------+---------+-----------+----------+-------------------+ FV Mid   Full                                                             +---------+---------------+---------+-----------+----------+-------------------+ FV DistalFull                                                             +---------+---------------+---------+-----------+----------+-------------------+ PFV      Full                                                             +---------+---------------+---------+-----------+----------+-------------------+   POP      Full           Yes      Yes                                      +---------+---------------+---------+-----------+----------+-------------------+ PTV      Full                                                             +---------+---------------+---------+-----------+----------+-------------------+ PERO                                                  Not well visualized +---------+---------------+---------+-----------+----------+-------------------+     Summary: RIGHT: - There is no evidence of deep vein thrombosis in the lower extremity.  - No cystic structure found in the popliteal fossa.  LEFT: - There is no evidence of deep vein thrombosis in the lower extremity. However, portions of this examination were limited- see technologist comments above.  - No cystic structure found in the popliteal fossa.  *See table(s) above for measurements and  observations. Electronically signed by Monica Martinez MD on 04/26/2020 at 3:00:12 PM.    Final    [place] EKG SITE RITE  Result Date: 04/16/2020 If Site Rite image not attached, placement could not be confirmed due to current cardiac rhythm.   Micro Results    Recent Results (from the past 240 hour(s))  Culture, Urine     Status: None   Collection Time: 04/26/20  2:38 PM   Specimen: Urine, Random  Result Value Ref Range Status   Specimen Description URINE, RANDOM  Final   Special Requests NONE  Final   Culture   Final    NO GROWTH Performed at [facility], [address], [place], [place] [address]    Report Status 04/27/2020 FINAL  Final       Today   Subjective:   Berenize Gatlin today herself denies any complaints, no significant events overnight.     Objective:   Blood pressure 125/60, pulse 100, temperature 99 F (37.2 C), temperature source Oral, resp. rate 20, height 5\' 6"  (1.676 m), weight 61.8 kg, SpO2 92 %.   Intake/Output Summary (Last 24 hours) at 05/05/2020 0955 Last data filed at 05/05/2020 0444 Gross per 24 hour  Intake 350 ml  Output 1300 ml  Net -950 ml    Exam , Alert, frail, oriented to time and place, and year. Symmetrical Chest wall movement, Good air movement bilaterally, CTAB RRR,No Gallops,Rubs or new Murmurs, No Parasternal Heave +ve B.Sounds, Abd Soft, Non tender. No Cyanosis, Clubbing or edema, No new Rash or bruise  Data Review   CBC w Diff:  Lab Results  Component Value Date   WBC 12.1 (H) 05/04/2020   HGB 8.6 (L) 05/04/2020   HGB 13.0 11/05/2016   HCT 28.1 (L) 05/04/2020   HCT 38.2 11/05/2016   PLT 414 (H) 05/04/2020   PLT 130 (L) 11/05/2016   LYMPHOPCT 16 04/20/2020   MONOPCT 6 04/20/2020   EOSPCT 0 04/20/2020   BASOPCT 1  04/20/2020    CMP:  Lab Results  Component Value Date   NA 138 05/04/2020   NA 143 11/05/2016   K 4.2 05/04/2020   CL 96 (L) 05/04/2020   CO2 26 05/04/2020   BUN 21 (H) 05/04/2020    BUN 25 (H) 11/05/2016   CREATININE 0.98 05/05/2020   PROT 7.5 04/14/2020   PROT 6.8 11/05/2016   ALBUMIN 3.8 04/14/2020   ALBUMIN 4.2 11/05/2016   BILITOT 0.6 04/14/2020   BILITOT 0.3 11/05/2016   ALKPHOS 74 04/14/2020   AST 13 (L) 04/14/2020   ALT 18 04/14/2020  .   Total Time in preparing paper work, data evaluation and todays exam - 45 minutes  Phillips Climes M.D on 05/05/2020 at 9:55 AM  [facility]   Office  [phone]

## 2020-05-05 NOTE — Progress Notes (Signed)
Patient admitted to floor via a bed transferred from 5W. Patient alert and oriented, denies distress or pain. Patient oriented to floor and plan  of care reviewed.  Call bell within reach and bed in lowest position. Will continue to monitor

## 2020-05-05 NOTE — Progress Notes (Signed)
Inpatient Rehabilitation Medication Review by a Pharmacist  A complete drug regimen review was completed for this patient to identify any potential clinically significant medication issues.  Clinically significant medication issues were identified:  no   Type of Medication Issue Identified Description of Issue Urgent (address now) Non-Urgent (address on AM team rounds) Plan Plan Accepted by Provider? (Yes / No / Pending AM Rounds)  Drug Interaction(s) (clinically significant)       Duplicate Therapy       Allergy       No Medication Administration End Date       Incorrect Dose       Additional Drug Therapy Needed       Other  Ditropan XL not resumed from PTA medication list Non-urgent F/U in am     Name of provider notified for urgent issues identified: n/a  Provider Method of Notification: n/a   For non-urgent medication issues to be resolved on team rounds tomorrow morning a CHL Secure Chat Handoff was sent to:    Pharmacist comments: changed valproic acid to Depakote ER tabs as taken at home - Pam Love aware.  Time spent performing this drug regimen review (minutes):  [place], PharmD, BCPS 5:45 PM

## 2020-05-05 NOTE — Progress Notes (Signed)
Physical Therapy Treatment Patient Details Name: Maria [name] MRN: [id number] DOB: August 04, 1960 Today's Date: 05/05/2020    History of Present Illness 59 yo admitted with sepsis, pyelonephritis and encephalopathy. 11/18 Sz with continuous EEG. PMhx: epilepsy, intellectual disability, GERD    PT Comments    Pt reports she needs to have a BM on entry. Pt requires increased time for command follow and continues to be impulsive in movements. Pt is min A for getting out of bed and modA for pivoting to [facility]. Provided maximal encouragement for ambulation after use of BSC and provided pt with RW. Pt reluctantly agrees. Pt attempt stepping with RW and then flexed forward and tried to swing hips to sit on bed pt required maxAx2 for safely getting to bed. Pt requires modA getting back to bed. Pt to d/c to CIR this afternoon.      Follow Up Recommendations  CIR     Equipment Recommendations  Rolling walker with 5" wheels       Precautions / Restrictions Precautions Precautions: Fall Precaution Comments: cortrak Restrictions Weight Bearing Restrictions: No    Mobility  Bed Mobility Overal bed mobility: Needs Assistance Bed Mobility: Supine to Sit;Sit to Supine     Supine to sit: HOB elevated;Min assist Sit to supine: Mod assist;+2 for physical assistance   General bed mobility comments: pt able to manage LE to EoB, requires min A for trunk to upright, able to return to supine, despite cuing can not scoot herself up in bed needs modAx2   Transfers Overall transfer level: Needs assistance Equipment used: 2 person hand held assist;Rolling walker (2 wheeled) Transfers: Sit to/from [facility] Sit to Stand: Mod assist Stand pivot transfers: Mod assist       General transfer comment: Pt stood EOB with two person hand held assist x2, then transferred to [facility] after reporting the need to have a BM and urinate.  Mod assist for transfer to [facility] mostly to support trunk over weak  legs with posterior preference.  For peri care, we switched to using the RW and one person mod first attempt and min second attempt stand from [facility].  Total assist for peri care and still mod assist of one to transfer back to bed with RW, uncontrolled premature sit to bed.    Ambulation/Gait Ambulation/Gait assistance: Max assist;+2 physical assistance Gait Distance (Feet): 2 Feet Assistive device: Rolling walker (2 wheeled) Gait Pattern/deviations: Trunk flexed;Step-to pattern;Decreased step length - right;Decreased step length - left     General Gait Details: attempted to ambulate after using BSC, despite maximal encouragement pt with increased forward flexion, trying to get hips over to bed to sit, able to take few steps, requires maxA to keep from short sitting on bed          Balance Overall balance assessment: Needs assistance Sitting-balance support: Feet supported;Bilateral upper extremity supported;No upper extremity supported Sitting balance-Leahy Scale: Poor Sitting balance - Comments: needs close min guard due to posterior lean  Postural control: Posterior lean Standing balance support: Bilateral upper extremity supported Standing balance-Leahy Scale: Poor Standing balance comment: requires at least assist of modA of one to stay upright due to desire to                              Cognition Arousal/Alertness: Awake/alert Behavior During Therapy: Flat affect;Impulsive Overall Cognitive Status: Impaired/Different from baseline Area of Impairment: Attention;Following commands;Safety/judgement;Awareness;Problem solving  Orientation Level:  (knows her sister) Current Attention Level: Sustained   Following Commands: Follows one step commands with increased time;Follows one step commands inconsistently Safety/Judgement: Decreased awareness of safety;Decreased awareness of deficits Awareness: Intellectual Problem Solving: Slow  processing;Difficulty sequencing;Requires verbal cues;Requires tactile cues;Decreased initiation General Comments: reports need to go to bathroom, follows one step commands, refuses additional mobility          General Comments General comments (skin integrity, edema, etc.): VSS on 2L O2 via [place]      Pertinent Vitals/Pain Pain Assessment: Faces Faces Pain Scale: Hurts a little bit Pain Location: generalized with mobility, unable to localize Pain Descriptors / Indicators: Grimacing;Guarding    Home Living                      Prior Function            PT Goals (current goals can now be found in the care plan section) Acute Rehab PT Goals Patient Stated Goal: pt did not state, sister wants her to get stronger so she can take her to the movies.  She likes Clifford the big red dog. PT Goal Formulation: With patient/family Time For Goal Achievement: 05/16/20 Potential to Achieve Goals: Good Progress towards PT goals: Progressing toward goals    Frequency    Min 3X/week      PT Plan Discharge plan needs to be updated       AM-PAC PT "6 Clicks" Mobility   Outcome Measure  Help needed turning from your back to your side while in a flat bed without using bedrails?: A Little Help needed moving from lying on your back to sitting on the side of a flat bed without using bedrails?: A Lot Help needed moving to and from a bed to a chair (including a wheelchair)?: A Lot Help needed standing up from a chair using your arms (e.g., wheelchair or bedside chair)?: A Lot Help needed to walk in hospital room?: A Lot Help needed climbing 3-5 steps with a railing? : Total 6 Click Score: 12    End of Session Equipment Utilized During Treatment: Gait belt Activity Tolerance: Patient tolerated treatment well Patient left: in bed;with call bell/phone within reach;with bed alarm set Nurse Communication: Mobility status PT Visit Diagnosis: Other abnormalities of gait and mobility  (R26.89);Muscle weakness (generalized) (M62.81);Difficulty in walking, not elsewhere classified (R26.2)     Time: 1135-1200 PT Time Calculation (min) (ACUTE ONLY): 25 min  Charges:  $Therapeutic Activity: 23-37 mins                     Jerrica Thorman B. Migdalia Dk PT, DPT Acute Rehabilitation Services Pager [phone] Office [phone]    [place] 05/05/2020, 12:32 PM

## 2020-05-05 NOTE — Progress Notes (Signed)
Patient was discharged from [phone] and transferred to 4W20 by MD order.  PICC line d/c. Belongings sent with patient. Patient will be transported to 4W20 by nurse tech via bed.   Pt's mother was informed of the transfer via phone call.

## 2020-05-06 ENCOUNTER — Inpatient Hospital Stay (HOSPITAL_COMMUNITY): Payer: Medicare Other | Admitting: Occupational Therapy

## 2020-05-06 ENCOUNTER — Inpatient Hospital Stay (HOSPITAL_COMMUNITY): Payer: Medicare Other

## 2020-05-06 ENCOUNTER — Inpatient Hospital Stay (HOSPITAL_COMMUNITY): Payer: Medicare Other | Admitting: Physical Therapy

## 2020-05-06 DIAGNOSIS — R5381 Other malaise: Principal | ICD-10-CM

## 2020-05-06 DIAGNOSIS — T50905A Adverse effect of unspecified drugs, medicaments and biological substances, initial encounter: Secondary | ICD-10-CM

## 2020-05-06 DIAGNOSIS — R410 Disorientation, unspecified: Secondary | ICD-10-CM

## 2020-05-06 DIAGNOSIS — G40909 Epilepsy, unspecified, not intractable, without status epilepticus: Secondary | ICD-10-CM

## 2020-05-06 LAB — GLUCOSE, CAPILLARY
Glucose-Capillary: 107 mg/dL — ABNORMAL HIGH (ref 70–99)
Glucose-Capillary: 100 mg/dL — ABNORMAL HIGH (ref 70–99)
Glucose-Capillary: 101 mg/dL — ABNORMAL HIGH (ref 70–99)
Glucose-Capillary: 147 mg/dL — ABNORMAL HIGH (ref 70–99)

## 2020-05-06 LAB — BASIC METABOLIC PANEL
Anion gap: 14 (ref 5–15)
BUN: 21 mg/dL — ABNORMAL HIGH (ref 6–20)
CO2: 28 mmol/L (ref 22–32)
Calcium: 9.9 mg/dL (ref 8.9–10.3)
Chloride: 96 mmol/L — ABNORMAL LOW (ref 98–111)
Creatinine, Ser: 1.06 mg/dL — ABNORMAL HIGH (ref 0.44–1.00)
GFR, Estimated: >60 mL/min (ref >60)
Glucose, Bld: 117 mg/dL — ABNORMAL HIGH (ref 70–99)
Potassium: 3.8 mmol/L (ref 3.5–5.1)
Sodium: 138 mmol/L (ref 135–145)

## 2020-05-06 LAB — AMMONIA: Ammonia: 16 umol/L (ref 9–35)

## 2020-05-06 MED ORDER — QUETIAPINE FUMARATE 50 MG PO TABS
50.0000 mg | ORAL_TABLET | Freq: Every day | ORAL | Status: DC
  Administered 2020-05-07 – 2020-05-09 (×3): 50 mg via ORAL
  Filled 2020-05-06 (×3): qty 1

## 2020-05-06 MED ORDER — QUETIAPINE FUMARATE 50 MG PO TABS
100.0000 mg | ORAL_TABLET | Freq: Every day | ORAL | Status: DC
  Administered 2020-05-06 – 2020-05-08 (×3): 100 mg via ORAL
  Filled 2020-05-06 (×3): qty 2

## 2020-05-06 NOTE — Evaluation (Signed)
Occupational Therapy Assessment and Plan  Patient Details  Name: Maria [name] MRN: [id number] Date of Birth: Jan 07, 1961  OT Diagnosis: cognitive deficits and muscle weakness (generalized) Rehab Potential: Rehab Potential (ACUTE ONLY): Good ELOS: 21-24 days   Today's Date: 05/06/2020 OT Individual Time: 1100-1200 OT Individual Time Calculation (min): 60 min     Hospital Problem: Principal Problem:   Debility   Past Medical History:  Past Medical History:  Diagnosis Date  . Constipation   . Dysphagia   . Fracture    R foot  . GERD (gastroesophageal reflux disease)   . History of shingles 10/2016  . Mental retardation    lesion in head  . Seizures ([place])    "not fully controlled on max doses of meds" (Neuro ofc note 12/2014)  . Thrombocytopenia ([place])   . Tremor    Past Surgical History:  Past Surgical History:  Procedure Laterality Date  . BIOPSY  09/16/2014   Procedure: BIOPSY;  Surgeon: Rogene Houston, MD;  Location: [facility];  Service: Endoscopy;;  . CATARACT EXTRACTION     both eyes, May of 2015  . COLONOSCOPY    . COLONOSCOPY WITH PROPOFOL N/A 11/20/2018   Procedure: COLONOSCOPY WITH PROPOFOL;  Surgeon: Rogene Houston, MD;  Location: [facility];  Service: Endoscopy;  Laterality: N/A;  . ESOPHAGEAL DILATION N/A 09/16/2014   Procedure: ESOPHAGEAL DILATION WITH 54FR MALONEY DILATOR;  Surgeon: Rogene Houston, MD;  Location: [facility];  Service: Endoscopy;  Laterality: N/A;  . ESOPHAGOGASTRODUODENOSCOPY (EGD) WITH PROPOFOL N/A 09/16/2014   Procedure: ESOPHAGOGASTRODUODENOSCOPY (EGD) WITH PROPOFOL;  Surgeon: Rogene Houston, MD;  Location: [facility];  Service: Endoscopy;  Laterality: N/A;  . MOUTH SURGERY    . POLYPECTOMY  11/20/2018   Procedure: POLYPECTOMY;  Surgeon: Rogene Houston, MD;  Location: [facility];  Service: Endoscopy;;  colon  . Skin graft to gum Right 08/2013  . TONSILLECTOMY AND ADENOIDECTOMY    . TOTAL ABDOMINAL HYSTERECTOMY      Assessment &  Plan Clinical Impression: Patient is a 59 y.o. year old female wwith history of seizure disorder, intellectual delay, esophageal stricture, tremors who was originally admitted on 04/14/20 with fevers, decrease intake X 3 days and mental status changes due to septic shock from pyelonephritis due to E coli UTI. She was treated with antibiotics and hospital course significant for break through seizures as well as delirium with agitation as well as hyperammonemia. Cortak placed for nutritional support and she required pressors for BP support as well as Precedex -->sitter due to agitation. Dr. Dalene Carrow consulted for input on seizures and she was loaded with Keppra. Neurology recommends continuing Valproic acid and depakene as felt to have baseline brief ictal-interictal discharges and no increase in AEDs unless patient has clinical seizures.    Patient transferred to CIR on 05/05/2020 .    Patient currently requires max/ total with basic self-care skills secondary to muscle weakness, decreased cardiorespiratoy endurance, decreased motor planning, decreased initiation, decreased attention, decreased awareness, decreased problem solving, decreased safety awareness, decreased memory and delayed processing and decreased sitting balance, decreased standing balance, decreased postural control and decreased balance strategies.  Prior to hospitalization, patient could complete BADL with supervision/ modified independent .  Patient will benefit from skilled intervention to increase independence with basic self-care skills prior to discharge home with care partner.  Anticipate patient will require 24 hour supervision and follow up home health.  OT - End of Session Endurance Deficit: Yes Endurance Deficit Description:  Rest breaks within BADL tasks OT Assessment Rehab Potential (ACUTE ONLY): Good OT Patient demonstrates impairments in the following area(s):  Balance;Behavior;Cognition;Endurance;Motor;Nutrition;Perception;Pain;Safety;Skin Integrity OT Basic ADL's Functional Problem(s): Eating;Grooming;Bathing;Dressing;Toileting OT Transfers Functional Problem(s): Toilet;Tub/Shower OT Additional Impairment(s): None OT Plan OT Intensity: Minimum of 1-2 x/day, 45 to 90 minutes OT Frequency: 5 out of 7 days OT Duration/Estimated Length of Stay: 21-24 days OT Treatment/Interventions: Balance/vestibular training;Cognitive remediation/compensation;Community reintegration;Discharge planning;Disease mangement/prevention;DME/adaptive equipment instruction;Functional electrical stimulation;Functional mobility training;Patient/family education;Neuromuscular re-education;Pain management;Skin care/wound managment;Psychosocial support;Self Care/advanced ADL retraining;Wheelchair propulsion/positioning;Visual/perceptual remediation/compensation;UE/LE Coordination activities;UE/LE Strength taining/ROM;Therapeutic Exercise;Therapeutic Activities;Splinting/orthotics OT Self Feeding Anticipated Outcome(s): supervision OT Basic Self-Care Anticipated Outcome(s): supervision OT Toileting Anticipated Outcome(s): supervision OT Bathroom Transfers Anticipated Outcome(s): supervision OT Recommendation Recommendations for Other Services: Neuropsych consult Patient destination: Home Follow Up Recommendations: Home health OT Equipment Recommended: To be determined   OT Evaluation Precautions/Restrictions  Precautions Precautions: Fall Restrictions Weight Bearing Restrictions: No Pain Pain Assessment Pain Scale: PAINAD Pain Score: 7  Faces Pain Scale: No hurt Pain Type: Acute pain Pain Location: Back Pain Descriptors / Indicators: Aching Pain Frequency: Intermittent Pain Onset: On-going Pain Intervention(s): Repositioned Home Living/Prior Functioning Home Living Family/patient expects to be discharged to:: Private residence Living Arrangements: Parent Available  Help at Discharge: Family, Available 24 hours/day Type of Home: House Home Access: Stairs to enter (per chart review) Entrance Stairs-Number of Steps: 1 Entrance Stairs-Rails: None (per chart review) Home Layout: Two level, Laundry or work area in basement, Able to live on main level with bedroom/bathroom (per chart review) Bathroom Shower/Tub: Government social research officer Accessibility: Yes Additional Comments: Above information obtained per chart review   Lives With: Family (Mother) IADL History Current License: No IADL Comments: Patient likes to go on cruises, likes to watch TV, loves [employer] Prior Function Level of Independence: Independent with basic ADLs, Independent with homemaking with ambulation (per patient report- will need to confirm with mother) Vocation: Unemployed Vision Patient Visual Report: No change from baseline Cognition Overall Cognitive Status: History of cognitive impairments - at baseline Arousal/Alertness: Awake/alert Orientation Level: Person Year: Other (Comment) (1921) Month: December Day of Week: Correct Memory: Impaired Memory Impairment: Retrieval deficit;Decreased recall of new information Immediate Memory Recall: Sock;Blue;Bed Memory Recall Sock: Not able to recall Memory Recall Blue: Without Cue Memory Recall Bed: Not able to recall Attention: Focused;Sustained Focused Attention: Impaired Focused Attention Impairment: Verbal basic;Functional basic Sustained Attention: Impaired Sustained Attention Impairment: Verbal basic;Functional basic Problem Solving: Impaired Problem Solving Impairment: Verbal basic;Functional basic Executive Function: Reasoning;Decision Making Reasoning: Impaired Reasoning Impairment: Verbal basic;Functional basic Decision Making: Impaired Decision Making Impairment: Verbal basic;Functional basic Safety/Judgment: Impaired Sensation Sensation Light Touch: Appears Intact Coordination Gross  Motor Movements are Fluid and Coordinated: No Fine Motor Movements are Fluid and Coordinated: No Coordination and Movement Description: decreased smoothness and accuracy 2/2 weakness Motor  Motor Motor - Skilled Clinical Observations: generalized weakness  Balance Static Sitting Balance Static Sitting - Balance Support: Feet supported Static Sitting - Level of Assistance: 3: Mod assist Dynamic Sitting Balance Dynamic Sitting - Balance Support: Feet supported Dynamic Sitting - Level of Assistance: 2: Max assist;3: Mod assist Static Standing Balance Static Standing - Balance Support: During functional activity Static Standing - Level of Assistance: 2: Max assist Dynamic Standing Balance Dynamic Standing - Balance Support: During functional activity Dynamic Standing - Level of Assistance: 2: Max assist Extremity/Trunk Assessment RUE Assessment RUE Assessment: Exceptions to [facility] General Strength Comments: 3-/5 overall, generalized weakness and difficulty following commands for accurate MMT LUE Assessment LUE Assessment: Exceptions to  [facility] General Strength Comments: 3-/5 overall, generalized weakness and difficulty following commands for accurate MMT  Care Tool Care Tool Self Care Eating   Eating Assist Level: Minimal Assistance - Patient > 75%    Oral Care    Oral Care Assist Level: Maximal assistance - Patient 25 - 49%    Bathing   Body parts bathed by patient: Right arm;Left arm;Chest;Abdomen;Face Body parts bathed by helper: Front perineal area;Buttocks;Right upper leg;Left upper leg;Right lower leg;Left lower leg   Assist Level: Total Assistance - Patient < 25%    Upper Body Dressing(including orthotics)   What is the patient wearing?: Pull over shirt   Assist Level: Maximal Assistance - Patient 25 - 49%    Lower Body Dressing (excluding footwear)   What is the patient wearing?: Pants Assist for lower body dressing: Total Assistance - Patient < 25%    Putting on/Taking  off footwear   What is the patient wearing?: Non-skid slipper socks Assist for footwear: Total Assistance - Patient < 25%       Care Tool Toileting Toileting activity   Assist for toileting: Total Assistance - Patient < 25%     Care Tool Bed Mobility Roll left and right activity   Roll left and right assist level: Total Assistance - Patient < 25%    Sit to lying activity   Sit to lying assist level: Total Assistance - Patient < 25%    Lying to sitting edge of bed activity   Lying to sitting edge of bed assist level: Total Assistance - Patient < 25%     Care Tool Transfers Sit to stand transfer   Sit to stand assist level: Maximal Assistance - Patient 25 - 49%    Chair/bed transfer   Chair/bed transfer assist level: Maximal Assistance - Patient 25 - 49%     Toilet transfer   Assist Level: Maximal Assistance - Patient 24 - 49%     Care Tool Cognition Expression of Ideas and Wants Expression of Ideas and Wants: Some difficulty - exhibits some difficulty with expressing needs and ideas (e.g, some words or finishing thoughts) or speech is not clear   Understanding Verbal and Non-Verbal Content Understanding Verbal and Non-Verbal Content: Usually understands - understands most conversations, but misses some part/intent of message. Requires cues at times to understand   Memory/Recall Ability *first 3 days only Memory/Recall Ability *first 3 days only: That he or she is in a hospital/hospital unit    Refer to Care Plan for [place] 1 OT Short Term Goal 1 (Week 1): Patient will maintain sitting balance at EOB with no more than mod A of 1 person within BADL task OT Short Term Goal 2 (Week 1): Pt will complete 1 step of toileting task OT Short Term Goal 3 (Week 1): Pt will complete sit<>stand with mod A of 1 perosn in preparation for BADL tasks  Recommendations for other services: Neuropsych   Skilled Therapeutic Intervention Patient lethargic upon  OT arrival. Pt initially declining therapy or getting OOB, but able to redirect pt to participate in BADLs. Pt slow to process and inconsistent following commands throughout session. She needed total A to come to sitting EOB. Once at EOB. Pt with intermittent bouts of supervision, but with very poor trunk control and forward flexed posture. Max cues and facilitation to achieve upright in sitting. Pt needed max A for any dynamic task in sitting and fatigued very quickly. Total A for UB  dressing seated EOB. Sit<>stand with max of 1 for trial stand. Pt reported need to rest and returned to supine. Pt then reported need to urinate. Pt had already been incontinent of bowel and bladder, but OT transferred her to [place] with max A. Lateral leans for clothing management with 1 person. Pt with small BM in commode and total A for peri-care. Pt returned to bed in similar fashion. Total A rolling L and R 2/2 poor initaition and lethargy, Total A to don new brief and pants. Pt left semi-reclined in bed with needs met and bed alarm on.    ADL  ADL Eating: Maximal assistance Grooming: Maximal assistance Where Assessed-Upper Body Bathing: Edge of bed Lower Body Bathing: Maximal assistance Upper Body Dressing: Maximal assistance Lower Body Dressing: Dependent Toileting: Dependent Toilet Transfer: Maximal assistance Mobility  Bed Mobility Bed Mobility: Rolling Right;Rolling Left;Supine to Sit;Sit to Supine Rolling Right: Total Assistance - Patient < 25% Rolling Left: Total Assistance - Patient < 25% Supine to Sit: Total Assistance - Patient < 25% Sit to Supine: Total Assistance - Patient < 25% Transfers Sit to Stand: Maximal Assistance - Patient 25-49% Stand to Sit: Maximal Assistance - Patient 25-49%   Discharge Criteria: Patient will be discharged from OT if patient refuses treatment 3 consecutive times without medical reason, if treatment goals not met, if there is a change in medical status,  if patient makes no progress towards goals or if patient is discharged from hospital.  The above assessment, treatment plan, treatment alternatives and goals were discussed and mutually agreed upon: by patient  Valma Cava 05/06/2020, 12:02 PM

## 2020-05-06 NOTE — Progress Notes (Signed)
[place] PHYSICAL MEDICINE & REHABILITATION PROGRESS NOTE   Subjective/Complaints: Had a fair night. When I talked about doing therapy today, she said she didn't feel like doing therapy  ROS: limited d/t behavior.    Objective:   No results found. Recent Labs    05/04/20 1528  WBC 12.1*  HGB 8.6*  HCT 28.1*  PLT 414*   Recent Labs    05/04/20 1528 05/04/20 1528 05/05/20 0419 05/06/20 0450  NA 138  --   --  138  K 4.2  --   --  3.8  CL 96*  --   --  96*  CO2 26  --   --  28  GLUCOSE 114*  --   --  117*  BUN 21*  --   --  21*  CREATININE 0.99   < > 0.98 1.06*  CALCIUM 9.8  --   --  9.9   < > = values in this interval not displayed.    Intake/Output Summary (Last 24 hours) at 05/06/2020 1049 Last data filed at 05/06/2020 0324 Gross per 24 hour  Intake 300 ml  Output --  Net 300 ml     Pressure Injury 04/22/20 Back Medial;Right Stage 2 -  Partial thickness loss of dermis presenting as a shallow open injury with a red, pink wound bed without slough. Medical device pressure injury-shape of lopez valve (Active)  04/22/20 0800  Location: Back  Location Orientation: Medial;Right  Staging: Stage 2 -  Partial thickness loss of dermis presenting as a shallow open injury with a red, pink wound bed without slough.  Wound Description (Comments): Medical device pressure injury-shape of lopez valve  Present on Admission: No    Physical Exam: Vital Signs Blood pressure 127/66, pulse (!) 103, temperature 98.3 F (36.8 C), temperature source Oral, resp. rate 20, height 5\' 6"  (1.676 m), weight 61.3 kg, SpO2 100 %.  General: Alert and oriented x 3, frail appearing HEENT: Head is normocephalic, atraumatic, PERRLA, EOMI, sclera anicteric, oral mucosa pink and moist, dentition intact, ext ear canals clear,  Neck: Supple without JVD or lymphadenopathy Heart:tachy. No murmurs rubs or gallops Chest: CTA bilaterally without wheezes, rales, or rhonchi; no distress Abdomen: Soft,  non-tender, non-distended, bowel sounds positive. Extremities: No clubbing, cyanosis, or edema. Pulses are 2+ Skin: Clean and intact without signs of breakdown Neuro: alert. Follows basic commands. Limited insight and awareness. Oriented to person, place, reason. Cranial nerves 2-12 are intact. Sensory exam is normal. Reflexes are 1+ in all 4's.  . No tremors. Motor function is grossly 3-4/5 (?effort).  Musculoskeletal: Full ROM, No pain with AROM or PROM in the neck, trunk, or extremities. Posture appropriate Psych: Pt's affect is appropriate. Pt is cooperative    Assessment/Plan: 1. Functional deficits which require 3+ hours per day of interdisciplinary therapy in a comprehensive inpatient rehab setting.  Physiatrist is providing close team supervision and 24 hour management of active medical problems listed below.  Physiatrist and rehab team continue to assess barriers to discharge/monitor patient progress toward functional and medical goals  Care Tool:  Bathing              Bathing assist       Upper Body Dressing/Undressing Upper body dressing        Upper body assist      Lower Body Dressing/Undressing Lower body dressing            Lower body assist       Toileting Toileting  Toileting assist Assist for toileting: Total Assistance - Patient < 25%     Transfers Chair/bed transfer  Transfers assist           Locomotion Ambulation   Ambulation assist              Walk 10 feet activity   Assist           Walk 50 feet activity   Assist           Walk 150 feet activity   Assist           Walk 10 feet on uneven surface  activity   Assist           Wheelchair     Assist               Wheelchair 50 feet with 2 turns activity    Assist            Wheelchair 150 feet activity     Assist          Blood pressure 127/66, pulse (!) 103, temperature 98.3 F (36.8 C), temperature  source Oral, resp. rate 20, height 5\' 6"  (1.676 m), weight 61.3 kg, SpO2 100 %.  Medical Problem List and Plan: 1.  Impaired mobility and ADLs secondary to left kidney pyelonephritis             -patient may shower             -ELOS/Goals: minA 2-3 weeks  -beginning therapies. Will need encouragement 2.  Antithrombotics: -DVT/anticoagulation:  Pharmaceutical: Lovenox             -antiplatelet therapy: N/A 3. Headaches/Pain Management: tylenol prn 4. Mood/delirium: LCSW to follow for evaluation and support.              -antipsychotic agents: N/A  -pt on fairly large doses of seroquel currently which it doesn't appear she was on PTA--->reduce day time seroquel to 50mg  and night time dose to 100   -will d/w neuro re: reducing clonazepam (1mg  tid) as well 5. Neuropsych: This patient is not fully capable of making decisions on her own behalf. 6. Skin/Wound Care: Routine pressure relief measures.  7. Fluids/Electrolytes/Nutrition: Monitor I/O. Follow up labs Monday.  8. Seizure disorder: Continue Vimpat 200 mg bid and Depakene 500 mg bid.             -- sleep wake chart to monitor sleep pattern.             --ammonia level nl  -check depakote level Monday 9. Dehydration: Improving. Encourage fluid intake --offer between meals. 10. Leucocytosis: Resolving.              --Recheck CBC on Monday 11. Anemia: Due to critical illness? H/H has been in 8-9 range. 8.6 on 12/3. Will order anemia panel 12. Tachycardia: Monitor HR tid--on metoprolol bid. Currently better controlled.  14. Stress induced hyperglycemia: Hgb A1C-5.7. CBGs 86-123 on 12/3 15. Constipation: On multiple laxatives including daily suppository.     LOS: 1 days A FACE TO [place] 05/06/2020, 10:49 AM

## 2020-05-06 NOTE — Plan of Care (Signed)
  Problem: RH BOWEL ELIMINATION Goal: RH STG MANAGE BOWEL WITH ASSISTANCE Description: STG Manage Bowel with min Assistance. Outcome: Not Progressing; incontinence   Problem: RH BLADDER ELIMINATION Goal: RH STG MANAGE BLADDER WITH ASSISTANCE Description: STG Manage Bladder With min Assistance Outcome: Not Progressing; incontinence   

## 2020-05-06 NOTE — Evaluation (Signed)
Physical Therapy Assessment and Plan  Patient Details  Name: Maria [name] MRN: [id number] Date of Birth: 03-31-1961  PT Diagnosis: Abnormal posture, Abnormality of gait, Cognitive deficits, Difficulty walking, Impaired cognition, Impaired sensation, Muscle weakness and Pain in headache Rehab Potential: Good ELOS: ~2.5 weeks   Today's Date: 05/06/2020 PT Individual Time: 2703-5009 PT Individual Time Calculation (min): 70 min    Hospital Problem: Principal Problem:   Debility   Past Medical History:  Past Medical History:  Diagnosis Date  . Constipation   . Dysphagia   . Fracture    R foot  . GERD (gastroesophageal reflux disease)   . History of shingles 10/2016  . Mental retardation    lesion in head  . Seizures ([place])    "not fully controlled on max doses of meds" (Neuro ofc note 12/2014)  . Thrombocytopenia ([place])   . Tremor    Past Surgical History:  Past Surgical History:  Procedure Laterality Date  . BIOPSY  09/16/2014   Procedure: BIOPSY;  Surgeon: Rogene Houston, MD;  Location: [facility];  Service: Endoscopy;;  . CATARACT EXTRACTION     both eyes, May of 2015  . COLONOSCOPY    . COLONOSCOPY WITH PROPOFOL N/A 11/20/2018   Procedure: COLONOSCOPY WITH PROPOFOL;  Surgeon: Rogene Houston, MD;  Location: [facility];  Service: Endoscopy;  Laterality: N/A;  . ESOPHAGEAL DILATION N/A 09/16/2014   Procedure: ESOPHAGEAL DILATION WITH 54FR MALONEY DILATOR;  Surgeon: Rogene Houston, MD;  Location: [facility];  Service: Endoscopy;  Laterality: N/A;  . ESOPHAGOGASTRODUODENOSCOPY (EGD) WITH PROPOFOL N/A 09/16/2014   Procedure: ESOPHAGOGASTRODUODENOSCOPY (EGD) WITH PROPOFOL;  Surgeon: Rogene Houston, MD;  Location: [facility];  Service: Endoscopy;  Laterality: N/A;  . MOUTH SURGERY    . POLYPECTOMY  11/20/2018   Procedure: POLYPECTOMY;  Surgeon: Rogene Houston, MD;  Location: [facility];  Service: Endoscopy;;  colon  . Skin graft to gum Right 08/2013  . TONSILLECTOMY AND  ADENOIDECTOMY    . TOTAL ABDOMINAL HYSTERECTOMY      Assessment & Plan Clinical Impression: Patient is a 59 y.o. year old female with history of seizure disorder, intellectual delay, esophageal stricture, tremors who was originally admitted on 04/14/20 with fevers, decrease intake X 3 days and mental status changes due to septic shock from pyelonephritis due to E coli UTI. She was treated with antibiotics and hospital course significant for break through seizures as well as delirium with agitation as well as hyperammonemia. Cortak placed for nutritional support and she required pressors for BP support as well as Precedex -->sitter due to agitation. Dr. Dalene Carrow consulted for input on seizures and she was loaded with Keppra. Neurology recommends continuing Valproic acid and depakene as felt to have baseline brief ictal-interictal discharges and no increase in AEDs unless patient has clinical seizures.  ST following for input on dysphagia and as mentation/lethargy improved, was started on dysphagia 1, thins on 11/29. MBS done 11/30 due to hypoxia with concerns of aspiration event. This revealed functional swallow without aspiration and she was advanced to dysphagia 3, thins. Therapy ongoing and patient noted to be debilitated. She continues to have balance deficits with tachycardia with activity as well as delayed processing with cognitive deficits affecting ADL/mobility. CIR recommended due to functional decline.   Patient transferred to CIR on 05/05/2020 .   Patient currently requires max assist with mobility secondary to muscle weakness, decreased cardiorespiratoy endurance, impaired timing and sequencing, unbalanced muscle activation, motor apraxia and decreased  motor planning, decreased midline orientation, decreased initiation, decreased attention, decreased awareness, decreased problem solving, decreased safety awareness, decreased memory and delayed processing and decreased sitting balance, decreased  standing balance, decreased postural control and decreased balance strategies.  Prior to hospitalization, patient was supervision with mobility and lived with Family (Mother) in a House home.  Home access is 1Stairs to enter.  Patient will benefit from skilled PT intervention to maximize safe functional mobility, minimize fall risk and decrease caregiver burden for planned discharge home with 24 hour supervision.  Anticipate patient will benefit from follow up [place] at discharge.  PT - End of Session Activity Tolerance: Tolerates 30+ min activity with multiple rests Endurance Deficit: Yes Endurance Deficit Description: quickly fatigues and falling asleep at end of session PT Assessment Rehab Potential (ACUTE/IP ONLY): Good PT Barriers to Discharge: Incontinence;Decreased caregiver support;Behavior PT Patient demonstrates impairments in the following area(s): Balance;Safety;Behavior;Sensory;Edema;Skin Integrity;Endurance;Motor;Nutrition;Pain;Perception PT Transfers Functional Problem(s): Bed Mobility;Bed to Chair;Car;Furniture PT Locomotion Functional Problem(s): Ambulation;Stairs PT Plan PT Intensity: Minimum of 1-2 x/day ,45 to 90 minutes PT Frequency: 5 out of 7 days PT Duration Estimated Length of Stay: ~2.5 weeks PT Treatment/Interventions: Community reintegration;Ambulation/gait training;DME/adaptive equipment instruction;Neuromuscular re-education;Psychosocial support;Stair training;UE/LE Strength taining/ROM;Balance/vestibular training;Discharge planning;Functional electrical stimulation;Pain management;Skin care/wound management;Therapeutic Activities;UE/LE Coordination activities;Cognitive remediation/compensation;Disease management/prevention;Functional mobility training;Patient/family education;Splinting/orthotics;Therapeutic Exercise;Visual/perceptual remediation/compensation PT Transfers Anticipated Outcome(s): supervision using LRAD PT Locomotion Anticipated Outcome(s): supervision  using LRAD PT Recommendation Follow Up Recommendations: Home health PT;24 hour supervision/assistance Patient destination: Home Equipment Recommended: To be determined   PT Evaluation Precautions/Restrictions Precautions Precautions: Fall Restrictions Weight Bearing Restrictions: No Pain Pain Assessment Pain Scale: Faces Faces Pain Scale: Hurts little more Pain Type: Acute pain Pain Location: Head Pain Descriptors / Indicators: Headache Pain Onset: On-going Pain Intervention(s): Rest;Relaxation;Repositioned;Distraction Home Living/Prior Functioning Home Living Available Help at Discharge: Family;Available 24 hours/day (mother only able to provide supervision, cannot provide physical assist) Type of Home: House Home Access: Stairs to enter Entrance Stairs-Number of Steps: 1 (1 brick height step) Entrance Stairs-Rails: None Home Layout: Two level;Laundry or work area in basement;Able to live on main level with bedroom/bathroom  Lives With: Nurse, mental health (lives full time with her mother, Romie Minus. pt's brother, Herbie [place], planning to stay with them until 12/25) Prior Function Level of Independence: Independent with transfers;Independent with gait  Able to Take Stairs?: Yes (to go to/from basement) Comments: supervision for safety, cognition, medication; pt's mother reports pt would help with light household tasks such as washing dishes or vacuuming; pt's mother reports pt hasn't had a seizure in 3 years; pt/brother report pt is very sedentary at baseline sleeping a lot during the day and otherwise sitting and watching TV Perception  Perception Perception: Impaired Praxis Praxis: Impaired Praxis Impairment Details: Motor planning  Cognition Overall Cognitive Status: History of cognitive impairments - at baseline (during session family reports pt's cognition is more impaired than baseline) Arousal/Alertness: Awake/alert Orientation Level: Oriented to person;Oriented to place;Oriented to  time (oriented to month but not year) Attention: Focused;Sustained Focused Attention: Impaired Sustained Attention: Impaired Memory: Impaired Problem Solving: Impaired Decision Making: Impaired Safety/Judgment: Impaired Sensation  Sensation Light Touch: Impaired Detail (difficult to determine but during screen pt unable to tell when therapist is tapping her LE vs when not tapping) Light Touch Impaired Details: Impaired RLE;Impaired LLE Hot/Cold: Not tested Proprioception: Impaired by gross assessment Stereognosis: Not tested Coordination Gross Motor Movements are Fluid and Coordinated: No Coordination and Movement Description: impaired due to weakness, impaired motor planning, and impaired midline orientation Heel Shin Test: unable to follow  commands to perform test Motor  Motor Motor: Other (comment);Abnormal postural alignment and control Motor - Skilled Clinical Observations: generalized weakness, posterior trunk lean   Trunk/Postural Assessment  Cervical Assessment Cervical Assessment: Exceptions to [facility] (forward head) Thoracic Assessment Thoracic Assessment: Exceptions to [facility] (increased thoracic kyphosis) Lumbar Assessment Lumbar Assessment: Exceptions to [facility] (posterior pelvic tilt in sitting) Postural Control Postural Control: Deficits on evaluation Trunk Control: impaired with posterior trunk lean Righting Reactions: significantly delayed and inadequate Protective Responses: significantly delayed Postural Limitations: decreased  Balance Balance Balance Assessed: Yes Static Sitting Balance Static Sitting - Balance Support: Feet supported Static Sitting - Level of Assistance: 3: Mod assist Dynamic Sitting Balance Dynamic Sitting - Balance Support: Feet supported Dynamic Sitting - Level of Assistance: 2: Max assist;3: Mod assist Sitting balance - Comments: posterior lean Static Standing Balance Static Standing - Balance Support: Bilateral upper extremity  supported Static Standing - Level of Assistance: 2: Max assist Dynamic Standing Balance Dynamic Standing - Balance Support: Bilateral upper extremity supported Dynamic Standing - Level of Assistance: 2: Max assist Extremity Assessment  RLE Assessment RLE Assessment: Exceptions to [facility] Active Range of Motion (AROM) Comments: WFL General Strength Comments: grossly 4/5 throughout demonstrated functionally due to impaired command following and limited participation preventing ability to perform formal assessment LLE Assessment LLE Assessment: Exceptions to [facility] Active Range of Motion (AROM) Comments: WFL General Strength Comments: grossly 4/5 throughout demonstrated functionally due to impaired command following and limited participation preventing ability to perform formal assessment  Care Tool Care Tool Bed Mobility Roll left and right activity   Roll left and right assist level: Moderate Assistance - Patient 50 - 74%    Sit to lying activity   Sit to lying assist level: Minimal Assistance - Patient > 75%    Lying to sitting edge of bed activity   Lying to sitting edge of bed assist level: Moderate Assistance - Patient 50 - 74%     Care Tool Transfers Sit to stand transfer   Sit to stand assist level: Moderate Assistance - Patient 50 - 74%    Chair/bed transfer   Chair/bed transfer assist level: Maximal Assistance - Patient 25 - 49%     Psychologist, counselling transfer activity did not occur: Safety/medical concerns        Care Tool Locomotion Ambulation Ambulation activity did not occur: Safety/medical concerns (required therapeutic intervention to ambulate safely)        Walk 10 feet activity Walk 10 feet activity did not occur: Safety/medical concerns       Walk 50 feet with 2 turns activity Walk 50 feet with 2 turns activity did not occur: Safety/medical concerns      Walk 150 feet activity Walk 150 feet activity did not occur: Safety/medical  concerns      Walk 10 feet on uneven surfaces activity Walk 10 feet on uneven surfaces activity did not occur: Safety/medical concerns      Stairs Stair activity did not occur: Safety/medical concerns        Walk up/down 1 step activity Walk up/down 1 step or curb (drop down) activity did not occur: Safety/medical concerns     Walk up/down 4 steps activity did not occuR: Safety/medical concerns  Walk up/down 4 steps activity      Walk up/down 12 steps activity Walk up/down 12 steps activity did not occur: Safety/medical concerns      Pick up small objects from floor Pick  up small object from the floor (from standing position) activity did not occur: Safety/medical concerns      Wheelchair Will patient use wheelchair at discharge?:  (TBD but do not anticipate)          Wheel 50 feet with 2 turns activity      Wheel 150 feet activity        Refer to Care Plan for Long Term Goals  SHORT TERM GOAL WEEK 1 PT Short Term Goal 1 (Week 1): Pt will perform supine<>sit with min assist PT Short Term Goal 2 (Week 1): Pt will perform sit<>stands using LRAD with min assist PT Short Term Goal 3 (Week 1): Pt will perform bed<>chair transfers using LRAD with min assist PT Short Term Goal 4 (Week 1): Pt will ambulate at least 52f using LRAD with min assist PT Short Term Goal 5 (Week 1): Pt will ascend/descend at least 1 step using LRAD with mod assist  Recommendations for other services: None   Skilled Therapeutic Intervention Pt received supine in bed with her mother, JRomie Minus and brother, RHerbie [place] present and with encouragement from family/therapist pt agreeable to therapy session. Evaluation completed (see details above) with patient/family education regarding purpose of PT evaluation, PT POC and goals, therapy schedule, weekly team meetings, and other CIR information including safety plan and fall risk safety. Pt initially stating she did not want to perform OOB mobility but with  encouragement and redirection to doff paper scrubs and don personal clothing pt agreeable. Supine>sitting R EOB, HOB slightly elevated and bedrail available, with mod assist for trunk upright. Sitting EOB require varying CGA to mod assist for trunk control due to progressively worsening posterior lean with fatigue and during dual-tasks. Pt demonstrates impaired motor planning and impaired awareness asking therapist to untie the paper scrub top, with pt thinking she had on gown. Doffed scrub top and donned pt's shirt with set-up assist. Sit<>stands to/from EOB with mod assist for lifting/balance due to posterior lean to doff paper scrub pants with min assist and don her pants with mod assist - pt demoing apraxia/impaired motor planning/impaired awareness trying to tie her pant waist string while the pants were around her thighs thinking she had already pulled them up over her hips - required cuing to correct. Once donned clean clothes pt spontaneously returns to supine reporting fatigue stating she doesn't want to perform any further OOB mobility - CGA for trunk steadying. Therapist and family educated and encouraged pt to continue with session and after supine rest break pt agreeable. R squat pivot EOB>w/c with mod assist for lifting/pivoting hips and balance.  Transported to/from gym in w/c for time management and energy conservation. Sit>stand w/c>RW with mod assist for lifting/balance due to posterior lean and therapist cuing to push up from arm rests. Gait training ~36fusing RW with mod assist of 1 and +2 w/c follow - demos R posterior lean throughout but able to advance LEs without assist and no knee instability noted - slow gait speed with decreased B LE step lengths and narrow BOS. Pt requesting to return to bed. R squat pivot w/c>EOB with max assist due to increasing fatigue. Sit>supine CGA for trunk descent. Therapist retrieved pt [id number] TIS w/c with gel cushion for improved upright, OOB activity tolerance  and increased pressure relief - educated pt/family on importance of getting OOB to w/c during the day. Pt left supine in bed with needs in reach, bed alarm on, and her mother present - pt starting to fall asleep.  Mobility Bed Mobility Bed Mobility: Sit to Supine;Supine to Sit Supine to Sit: Moderate Assistance - Patient 50-74% Sit to Supine: Contact Guard/Touching assist Transfers Transfers: Sit to Stand;Stand to Sit;Squat Pivot Transfers Sit to Stand: Moderate Assistance - Patient 50-74%;Maximal Assistance - Patient 25-49% Stand to Sit: Moderate Assistance - Patient 50-74%;Maximal Assistance - Patient 25-49% Stand Pivot Transfers: Maximal Assistance - Patient 25 - 49%;Moderate Assistance - Patient 50 - 74% Transfer (Assistive device): Rolling walker Locomotion  Gait Ambulation: Yes Gait Assistance: 2 Helpers (mod A of 1 and +2 w/c follow) Gait Distance (Feet): 35 Feet Assistive device: Rolling walker Gait Assistance Details: Verbal cues for technique;Verbal cues for gait pattern;Verbal cues for sequencing;Verbal cues for safe use of DME/AE;Manual facilitation for weight shifting;Tactile cues for weight shifting;Tactile cues for sequencing;Tactile cues for initiation;Tactile cues for posture Gait Assistance Details: pt demos strong R posterior lean throughout with cuing to improve but minimal change noted Gait Gait: Yes Gait Pattern: Impaired Gait Pattern: Poor foot clearance - left;Poor foot clearance - right;Decreased step length - left;Decreased step length - right;Narrow base of support Gait velocity: decreased Stairs / Additional Locomotion Stairs: No Wheelchair Mobility Wheelchair Mobility: No   Discharge Criteria: Patient will be discharged from PT if patient refuses treatment 3 consecutive times without medical reason, if treatment goals not met, if there is a change in medical status, if patient makes no progress towards goals or if patient is discharged from  hospital.  The above assessment, treatment plan, treatment alternatives and goals were discussed and mutually agreed upon: by patient and by family  Tawana Scale , PT, DPT, CSRS 05/06/2020, 12:13 PM

## 2020-05-06 NOTE — Evaluation (Addendum)
Speech Language Pathology Assessment and Plan  Patient Details  Name: Maria [name] MRN: [id number] Date of Birth: [birth year]  SLP Diagnosis: Cognitive Impairments;Dysphagia  Rehab Potential: Good ELOS: 10-12 days    Today's Date: 05/06/2020 SLP Individual Time: 1740-8144 SLP Individual Time Calculation (min): 60 min   Hospital Problem: Principal Problem:   Debility  Past Medical History:  Past Medical History:  Diagnosis Date  . Constipation   . Dysphagia   . Fracture    R foot  . GERD (gastroesophageal reflux disease)   . History of shingles 10/2016  . Mental retardation    lesion in head  . Seizures ([place])    "not fully controlled on max doses of meds" (Neuro ofc note 12/2014)  . Thrombocytopenia ([place])   . Tremor    Past Surgical History:  Past Surgical History:  Procedure Laterality Date  . BIOPSY  09/16/2014   Procedure: BIOPSY;  Surgeon: Rogene Houston, MD;  Location: [facility];  Service: Endoscopy;;  . CATARACT EXTRACTION     both eyes, May of 2015  . COLONOSCOPY    . COLONOSCOPY WITH PROPOFOL N/A 11/20/2018   Procedure: COLONOSCOPY WITH PROPOFOL;  Surgeon: Rogene Houston, MD;  Location: [facility];  Service: Endoscopy;  Laterality: N/A;  . ESOPHAGEAL DILATION N/A 09/16/2014   Procedure: ESOPHAGEAL DILATION WITH 54FR MALONEY DILATOR;  Surgeon: Rogene Houston, MD;  Location: [facility];  Service: Endoscopy;  Laterality: N/A;  . ESOPHAGOGASTRODUODENOSCOPY (EGD) WITH PROPOFOL N/A 09/16/2014   Procedure: ESOPHAGOGASTRODUODENOSCOPY (EGD) WITH PROPOFOL;  Surgeon: Rogene Houston, MD;  Location: [facility];  Service: Endoscopy;  Laterality: N/A;  . MOUTH SURGERY    . POLYPECTOMY  11/20/2018   Procedure: POLYPECTOMY;  Surgeon: Rogene Houston, MD;  Location: [facility];  Service: Endoscopy;;  colon  . Skin graft to gum Right 08/2013  . TONSILLECTOMY AND ADENOIDECTOMY    . TOTAL ABDOMINAL HYSTERECTOMY      Assessment / Plan / Recommendation Clinical Impression   Maria [name] is a 59 year old female with history of seizure disorder, intellectual delay, esophageal stricture, tremors who was originally admitted on 04/14/20 with fevers, decrease intake X 3 days and mental status changes due to septic shock from pyelonephritis due to E coli UTI. She was treated with antibiotics and hospital course significant for break through seizures as well as delirium with agitation as well as hyperammonemia. Cortak placed for nutritional support and she required pressors for BP support as well as Precedex -->sitter due to agitation. Dr. Dalene Carrow consulted for input on seizures and she was loaded with Keppra. Neurology recommends continuing Valproic acid and depakene as felt to have baseline brief ictal-interictal discharges and no increase in AEDs unless patient has clinical seizures.  ST following for input on dysphagia and as mentation/lethargy improved, was started on dysphagia 1, thins on 11/29. MBS done 11/30 due to hypoxia with concerns of aspiration event. This revealed functional swallow without aspiration and she was advanced to dysphagia 3, thins. Therapy ongoing and patient noted to be debilitated. She continues to have balance deficits with tachycardia with activity as well as delayed processing with cognitive deficits affecting ADL/mobility. CIR recommended due to functional decline.  Pt presents with moderate cognitive linguistic impairment characterized by decreased memory, decreased sustained attention, slow processing, and decreased orientation. Pt has baseline cognitive deficits per H&P. Pt was often distracted by people in hallway and needed encouragement to at least attempt subtests administered during evaluation. She  stated her age was 59 and needed a choice of 2 options to state the date correctly. She frequently stated "I dont know." or "I hadnt thought about it" in response to questions. She was unable to recall purpose of hospitalization and required education  purpose of call bell. Pt also presents with minimal oropharyngeal dysphagia characterized by extended mastication and immediate cough after swallow with thin liquids. MBSS completed on 11/30 recommending dys 3 diet and thin liquids. Recommend pt continue with this diet and thin liquids. Pt will benefit from skilled SLP tx.     Skilled Therapeutic Interventions          SLP evaluation completed. Results and plan of care reviewed with patient.   SLP Assessment  Patient will need skilled Speech Lanaguage Pathology Services during CIR admission    Recommendations  SLP Diet Recommendations: Dysphagia 3 (Mech soft);Thin Liquid Administration via: Cup Medication Administration: Whole meds with liquid Supervision: Patient able to self feed Compensations: Minimize environmental distractions Postural Changes and/or Swallow Maneuvers: Seated upright 90 degrees Oral Care Recommendations: Oral care BID Patient destination: Home Follow up Recommendations: Home Health SLP Equipment Recommended: None recommended by SLP    SLP Frequency 3 to 5 out of 7 days   SLP Duration  SLP Intensity  SLP Treatment/Interventions 10-12 days  Minumum of 1-2 x/day, 30 to 90 minutes  Cognitive remediation/compensation;Patient/family education;Internal/external aids;Dysphagia/aspiration precaution training;Therapeutic Activities    Pain Pain Assessment Pain Scale: Faces Faces Pain Scale: No hurt  Prior Functioning Cognitive/Linguistic Baseline: Baseline deficits Type of Home: House  Lives With: Other (Comment) (mother) Available Help at Discharge: Family;Available 24 hours/day Vocation: Unemployed  SLP Evaluation Cognition Overall Cognitive Status: History of cognitive impairments - at baseline Arousal/Alertness: Awake/alert Orientation Level: Oriented to person;Oriented to place;Oriented to time Attention: Focused;Sustained Focused Attention: Impaired Focused Attention Impairment: Verbal  basic;Functional basic Sustained Attention: Impaired Sustained Attention Impairment: Verbal basic;Functional basic Memory: Impaired Memory Impairment: Retrieval deficit;Decreased recall of new information Problem Solving: Impaired Problem Solving Impairment: Verbal basic;Functional basic Executive Function: Reasoning;Decision Making Reasoning: Impaired Reasoning Impairment: Verbal basic;Functional basic Decision Making: Impaired Decision Making Impairment: Verbal basic;Functional basic Safety/Judgment: Impaired  Comprehension Auditory Comprehension Overall Auditory Comprehension: Appears within functional limits for tasks assessed Expression Expression Primary Mode of Expression: Verbal Verbal Expression Overall Verbal Expression: Appears within functional limits for tasks assessed Initiation: No impairment Oral Motor Oral Motor/Sensory Function Overall Oral Motor/Sensory Function: Within functional limits Motor Speech Overall Motor Speech: Appears within functional limits for tasks assessed  Care Tool Care Tool Cognition Expression of Ideas and Wants     Understanding Verbal and Non-Verbal Content     Memory/Recall Ability *first 3 days only       Bedside Swallowing Assessment General Date of Onset: 04/14/20 Previous Swallow Assessment: MBS completed on 11/30. recommending dys 3 ith thin liquids. Diet Prior to this Study: Regular;Thin liquids Respiratory Status: Supplemental O2 delivered via (comment) (nasal cannula) History of Recent Intubation: No Behavior/Cognition: Alert;Cooperative Oral Cavity - Dentition: Adequate natural dentition Patient Positioning: Upright in bed Baseline Vocal Quality: Normal Volitional Cough: Strong Volitional Swallow: Unable to elicit  Oral Care Assessment Does patient have any of the following "high(er) risk" factors?: None of the above Does patient have any of the following "at risk" factors?: Oxygen therapy - cannula, mask, simple  oxygen devices Ice Chips Ice chips: Within functional limits Thin Liquid Thin Liquid: Impaired Presentation: Straw;Cup Oral Phase Impairments: Impaired mastication;Reduced lingual movement/coordination Pharyngeal  Phase Impairments: Cough - Immediate Solid Solid: Impaired  Oral Phase Impairments: Reduced lingual movement/coordination;Impaired mastication Oral Phase Functional Implications: Impaired mastication BSE Assessment Risk for Aspiration Other Related Risk Factors: Cognitive impairment  Short Term Goals: Week 1: SLP Short Term Goal 1 (Week 1): Pt will tolerate trials of dysphagia 4 consistency and thin liquids with no overt s/sx of aspiration or penetration on 9/10 trials. SLP Short Term Goal 2 (Week 1): Pt will demonstrate adequate safety awareness for hospital situation with 85% accuracy min a verbal cues. SLP Short Term Goal 3 (Week 1): Pt will demonstrate orientation x4 using external aids with min A verbal cues. SLP Short Term Goal 4 (Week 1): Pt will sustain attention to functional tasks for 5-6 minutes with min A verbal cues.  Refer to Care Plan for Long Term Goals  Recommendations for other services: None   Discharge Criteria: Patient will be discharged from SLP if patient refuses treatment 3 consecutive times without medical reason, if treatment goals not met, if there is a change in medical status, if patient makes no progress towards goals or if patient is discharged from hospital.  The above assessment, treatment plan, treatment alternatives and goals were discussed and mutually agreed upon: by patient  [place] 05/06/2020, 8:28 AM

## 2020-05-07 LAB — GLUCOSE, CAPILLARY
Glucose-Capillary: 112 mg/dL — ABNORMAL HIGH (ref 70–99)
Glucose-Capillary: 108 mg/dL — ABNORMAL HIGH (ref 70–99)
Glucose-Capillary: 116 mg/dL — ABNORMAL HIGH (ref 70–99)
Glucose-Capillary: 119 mg/dL — ABNORMAL HIGH (ref 70–99)

## 2020-05-07 LAB — OCCULT BLOOD X 1 CARD TO LAB, STOOL: Fecal Occult Bld: NEGATIVE

## 2020-05-07 MED ORDER — DOCUSATE SODIUM 100 MG PO CAPS
100.0000 mg | ORAL_CAPSULE | Freq: Two times a day (BID) | ORAL | Status: DC
  Administered 2020-05-09 – 2020-05-12 (×4): 100 mg via ORAL
  Filled 2020-05-07 (×6): qty 1

## 2020-05-07 MED ORDER — CLONAZEPAM 0.5 MG PO TABS
0.5000 mg | ORAL_TABLET | Freq: Three times a day (TID) | ORAL | Status: DC
  Administered 2020-05-07 – 2020-05-11 (×11): 0.5 mg via ORAL
  Filled 2020-05-07 (×12): qty 1

## 2020-05-07 NOTE — Progress Notes (Signed)
[facility] PHYSICAL MEDICINE & REHABILITATION PROGRESS NOTE   Subjective/Complaints: No problems overnight. She participated in therapies yesterday. Mother called concerned about dose of klonopin she was taking currently (only took 0.5mg  at home)  ROS: Limited due to cognitive/behavioral    Objective:   No results found. Recent Labs    05/04/20 1528  WBC 12.1*  HGB 8.6*  HCT 28.1*  PLT 414*   Recent Labs    05/04/20 1528 05/04/20 1528 05/05/20 0419 05/06/20 0450  NA 138  --   --  138  K 4.2  --   --  3.8  CL 96*  --   --  96*  CO2 26  --   --  28  GLUCOSE 114*  --   --  117*  BUN 21*  --   --  21*  CREATININE 0.99   < > 0.98 1.06*  CALCIUM 9.8  --   --  9.9   < > = values in this interval not displayed.    Intake/Output Summary (Last 24 hours) at 05/07/2020 1043 Last data filed at 05/07/2020 0800 Gross per 24 hour  Intake 1600 ml  Output --  Net 1600 ml     Pressure Injury 04/22/20 Back Medial;Right Stage 2 -  Partial thickness loss of dermis presenting as a shallow open injury with a red, pink wound bed without slough. Medical device pressure injury-shape of lopez valve (Active)  04/22/20 0800  Location: Back  Location Orientation: Medial;Right  Staging: Stage 2 -  Partial thickness loss of dermis presenting as a shallow open injury with a red, pink wound bed without slough.  Wound Description (Comments): Medical device pressure injury-shape of lopez valve  Present on Admission: No    Physical Exam: Vital Signs Blood pressure 114/76, pulse 96, temperature 98.2 F (36.8 C), resp. rate 20, height 5\' 6"  (1.676 m), weight 61.3 kg, SpO2 97 %.  Constitutional: No distress . Vital signs reviewed. HEENT: EOMI, oral membranes moist Neck: supple Cardiovascular: RRR without murmur. No JVD    Respiratory/Chest: CTA Bilaterally without wheezes or rales. Normal effort    GI/Abdomen: BS +, non-tender, non-distended Ext: no clubbing, cyanosis, or edema Psych: flat  and slow to engage Skin: Clean and intact without signs of breakdown Neuro: fairly alert. Follows basic commands. Limited insight and awareness. Oriented to person, place, reason. Cranial nerves 2-12 are intact. Sensory exam is normal. Reflexes are 1+ in all 4's.  . No tremors. Motor function is grossly 3-4/5 (?effort).  Musculoskeletal: Full ROM, No pain with AROM or PROM in the neck, trunk, or extremities. Posture appropriate      Assessment/Plan: 1. Functional deficits which require 3+ hours per day of interdisciplinary therapy in a comprehensive inpatient rehab setting.  Physiatrist is providing close team supervision and 24 hour management of active medical problems listed below.  Physiatrist and rehab team continue to assess barriers to discharge/monitor patient progress toward functional and medical goals  Care Tool:  Bathing    Body parts bathed by patient: Right arm, Left arm, Chest, Abdomen, Face   Body parts bathed by helper: Front perineal area, Buttocks, Right upper leg, Left upper leg, Right lower leg, Left lower leg     Bathing assist Assist Level: Total Assistance - Patient < 25%     Upper Body Dressing/Undressing Upper body dressing   What is the patient wearing?: Pull over shirt    Upper body assist Assist Level: Maximal Assistance - Patient 25 - 49%  Lower Body Dressing/Undressing Lower body dressing      What is the patient wearing?: Incontinence brief     Lower body assist Assist for lower body dressing: 2 Helpers     Toileting Toileting    Toileting assist Assist for toileting: 2 Helpers     Transfers Chair/bed transfer  Transfers assist     Chair/bed transfer assist level: Maximal Assistance - Patient 25 - 49%     Locomotion Ambulation   Ambulation assist   Ambulation activity did not occur: Safety/medical concerns (required therapeutic intervention to ambulate safely)          Walk 10 feet activity   Assist  Walk 10  feet activity did not occur: Safety/medical concerns        Walk 50 feet activity   Assist Walk 50 feet with 2 turns activity did not occur: Safety/medical concerns         Walk 150 feet activity   Assist Walk 150 feet activity did not occur: Safety/medical concerns         Walk 10 feet on uneven surface  activity   Assist Walk 10 feet on uneven surfaces activity did not occur: Safety/medical concerns         Wheelchair     Assist Will patient use wheelchair at discharge?:  (TBD but do not anticipate)             Wheelchair 50 feet with 2 turns activity    Assist            Wheelchair 150 feet activity     Assist          Blood pressure 114/76, pulse 96, temperature 98.2 F (36.8 C), resp. rate 20, height 5\' 6"  (1.676 m), weight 61.3 kg, SpO2 97 %.  Medical Problem List and Plan: 1.  Impaired mobility and ADLs secondary to left kidney pyelonephritis             -patient may shower             -ELOS/Goals: minA 2-3 weeks  -team to provide encouragement,ego support 2.  Antithrombotics: -DVT/anticoagulation:  Pharmaceutical: Lovenox             -antiplatelet therapy: N/A 3. Headaches/Pain Management: tylenol prn 4. Mood/delirium: LCSW to follow for evaluation and support.              -antipsychotic agents: N/A  12/4-pt on fairly large doses of seroquel currently which it doesn't appear she was on PTA--->reduce day time seroquel to 50mg  and night time dose to 100   12/5 pt was not placed on klonopin for addnl seizure prophylaxis   -will reduce to 0.5mg  TID today which is home dose   -continue same seroquel with plans to reduce these further this week 5. Neuropsych: This patient is not fully capable of making decisions on her own behalf. 6. Skin/Wound Care: Routine pressure relief measures.  7. Fluids/Electrolytes/Nutrition: Monitor I/O. Follow up labs Monday.  8. Seizure disorder: Continue Vimpat 200 mg bid and Depakene 500 mg  bid.             -- sleep wake chart to monitor sleep pattern.             --ammonia level nl  -check depakote level/LFT's Monday 9. Dehydration: Improving. Encourage fluid intake --offer between meals. 10. Leucocytosis: Resolving.              --Recheck CBC on Monday 11. Anemia: Due to critical  illness? H/H has been in 8-9 range. 8.6 on 12/3. Will order anemia panel 12. Tachycardia: Monitor HR tid--on metoprolol bid. Currently better controlled.  14. Stress induced hyperglycemia: Hgb A1C-5.7. CBGs 86-123 on 12/3 15. Constipation: On multiple laxatives including daily suppository.    12/5 -had large liquid bowel movement yesterday     -hold miralax for now LOS: 2 days A FACE TO [place] 05/07/2020, 10:43 AM

## 2020-05-08 ENCOUNTER — Inpatient Hospital Stay (HOSPITAL_COMMUNITY): Payer: Medicare Other | Admitting: Speech Pathology

## 2020-05-08 ENCOUNTER — Inpatient Hospital Stay (HOSPITAL_COMMUNITY): Payer: Medicare Other

## 2020-05-08 ENCOUNTER — Inpatient Hospital Stay (HOSPITAL_COMMUNITY): Payer: Medicare Other | Admitting: Occupational Therapy

## 2020-05-08 LAB — CBC WITH DIFFERENTIAL/PLATELET
Abs Immature Granulocytes: 0.07 10*3/uL (ref 0.00–0.07)
Basophils Absolute: 0.1 10*3/uL (ref 0.0–0.1)
Basophils Relative: 1 %
Eosinophils Absolute: 0.1 10*3/uL (ref 0.0–0.5)
Eosinophils Relative: 1 %
HCT: 29.7 % — ABNORMAL LOW (ref 36.0–46.0)
Hemoglobin: 9.2 g/dL — ABNORMAL LOW (ref 12.0–15.0)
Immature Granulocytes: 1 %
Lymphocytes Relative: 26 %
Lymphs Abs: 1.6 10*3/uL (ref 0.7–4.0)
MCH: 29.8 pg (ref 26.0–34.0)
MCHC: 31 g/dL (ref 30.0–36.0)
MCV: 96.1 fL (ref 80.0–100.0)
Monocytes Absolute: 0.7 10*3/uL (ref 0.1–1.0)
Monocytes Relative: 10 %
Neutro Abs: 3.8 10*3/uL (ref 1.7–7.7)
Neutrophils Relative %: 61 %
Platelets: 371 10*3/uL (ref 150–400)
RBC: 3.09 MIL/uL — ABNORMAL LOW (ref 3.87–5.11)
RDW: 14.6 % (ref 11.5–15.5)
WBC: 6.3 10*3/uL (ref 4.0–10.5)
nRBC: 0 % (ref 0.0–0.2)

## 2020-05-08 LAB — COMPREHENSIVE METABOLIC PANEL
ALT: 42 U/L (ref 0–44)
AST: 29 U/L (ref 15–41)
Albumin: 2.8 g/dL — ABNORMAL LOW (ref 3.5–5.0)
Alkaline Phosphatase: 111 U/L (ref 38–126)
Anion gap: 13 (ref 5–15)
BUN: 16 mg/dL (ref 6–20)
CO2: 26 mmol/L (ref 22–32)
Calcium: 9.7 mg/dL (ref 8.9–10.3)
Chloride: 99 mmol/L (ref 98–111)
Creatinine, Ser: 1.12 mg/dL — ABNORMAL HIGH (ref 0.44–1.00)
GFR, Estimated: 57 mL/min — ABNORMAL LOW (ref >60)
Glucose, Bld: 100 mg/dL — ABNORMAL HIGH (ref 70–99)
Potassium: 3.8 mmol/L (ref 3.5–5.1)
Sodium: 138 mmol/L (ref 135–145)
Total Bilirubin: 0.6 mg/dL (ref 0.3–1.2)
Total Protein: 7.2 g/dL (ref 6.5–8.1)

## 2020-05-08 LAB — RETICULOCYTES
Immature Retic Fract: 27.9 % — ABNORMAL HIGH (ref 2.3–15.9)
RBC.: 3.13 MIL/uL — ABNORMAL LOW (ref 3.87–5.11)
Retic Count, Absolute: 125 10*3/uL (ref 19.0–186.0)
Retic Ct Pct: 4 % — ABNORMAL HIGH (ref 0.4–3.1)

## 2020-05-08 LAB — GLUCOSE, CAPILLARY
Glucose-Capillary: 97 mg/dL (ref 70–99)
Glucose-Capillary: 112 mg/dL — ABNORMAL HIGH (ref 70–99)
Glucose-Capillary: 101 mg/dL — ABNORMAL HIGH (ref 70–99)
Glucose-Capillary: 100 mg/dL — ABNORMAL HIGH (ref 70–99)

## 2020-05-08 LAB — IRON AND TIBC
Iron: 43 ug/dL (ref 28–170)
Saturation Ratios: 13 % (ref 10.4–31.8)
TIBC: 337 ug/dL (ref 250–450)
UIBC: 294 ug/dL

## 2020-05-08 LAB — VALPROIC ACID LEVEL: Valproic Acid Lvl: 51 ug/mL (ref 50.0–100.0)

## 2020-05-08 LAB — FOLATE: Folate: 32.4 ng/mL (ref >5.9)

## 2020-05-08 LAB — FERRITIN: Ferritin: 418 ng/mL — ABNORMAL HIGH (ref 11–307)

## 2020-05-08 LAB — VITAMIN B12: Vitamin B-12: 1246 pg/mL — ABNORMAL HIGH (ref 180–914)

## 2020-05-08 MED ORDER — MOMETASONE FURO-FORMOTEROL FUM 200-5 MCG/ACT IN AERO
2.0000 | INHALATION_SPRAY | Freq: Two times a day (BID) | RESPIRATORY_TRACT | Status: DC
  Administered 2020-05-08 – 2020-05-11 (×6): 2 via RESPIRATORY_TRACT
  Filled 2020-05-08: qty 8.8

## 2020-05-08 NOTE — IPOC Note (Signed)
Overall Plan of Care [facility]) Patient Details Name: MARIANE BURPEE MRN: [id number] DOB: 10-13-1960  Admitting Diagnosis: [place] Hospital Problems: Principal Problem:   Debility     Functional Problem List: Nursing Behavior, Bladder, Bowel, Endurance, Medication Management, Safety, Other (comment) (intellectual delays and resposes)  PT Balance, Safety, Behavior, Sensory, Edema, Skin Integrity, Endurance, Motor, Nutrition, Pain, Perception  OT Balance, Behavior, Cognition, Endurance, Motor, Nutrition, Perception, Pain, Safety, Skin Integrity  SLP Cognition  TR         Basic ADL's: OT Eating, Grooming, Bathing, Dressing, Toileting     Advanced  ADL's: OT       Transfers: PT Bed Mobility, Bed to Chair, Car, Manufacturing systems engineer, Metallurgist: PT Ambulation, Stairs     Additional Impairments: OT None  SLP Swallowing, Social Cognition, Communication   Problem Solving, Memory, Attention, Awareness  TR      Anticipated Outcomes Item Anticipated Outcome  Self Feeding supervision  Swallowing      Basic self-care  supervision  Toileting  supervision   Bathroom Transfers supervision  Bowel/Bladder  continent Bowel and Bladder  Transfers  supervision using LRAD  Locomotion  supervision using LRAD  Communication     Cognition     Pain  Pain <2   Safety/Judgment  supervision   Therapy Plan: PT Intensity: Minimum of 1-2 x/day ,45 to 90 minutes PT Frequency: 5 out of 7 days PT Duration Estimated Length of Stay: ~2.5 weeks OT Intensity: Minimum of 1-2 x/day, 45 to 90 minutes OT Frequency: 5 out of 7 days OT Duration/Estimated Length of Stay: 21-24 days SLP Intensity: Minumum of 1-2 x/day, 30 to 90 minutes SLP Frequency: 3 to 5 out of 7 days SLP Duration/Estimated Length of Stay: 10-12 days   Due to the current state of emergency, patients may not be receiving their 3-hours of Medicare-mandated therapy.   Team Interventions: Nursing  Interventions Patient/Family Education, Bladder Management, Bowel Management, Disease Management/Prevention, Medication Management, Discharge Planning  PT interventions Community reintegration, Ambulation/gait training, DME/adaptive equipment instruction, Neuromuscular re-education, Psychosocial support, Stair training, UE/LE Strength taining/ROM, Training and development officer, Discharge planning, Functional electrical stimulation, Pain management, Skin care/wound management, Therapeutic Activities, UE/LE Coordination activities, Cognitive remediation/compensation, Disease management/prevention, Functional mobility training, Patient/family education, Splinting/orthotics, Therapeutic Exercise, Visual/perceptual remediation/compensation  OT Interventions Balance/vestibular training, Cognitive remediation/compensation, Community reintegration, Discharge planning, Disease mangement/prevention, DME/adaptive equipment instruction, Functional electrical stimulation, Functional mobility training, Patient/family education, Neuromuscular re-education, Pain management, Skin care/wound managment, Psychosocial support, Self Care/advanced ADL retraining, Wheelchair propulsion/positioning, Visual/perceptual remediation/compensation, UE/LE Coordination activities, UE/LE Strength taining/ROM, Therapeutic Exercise, Therapeutic Activities, Splinting/orthotics  SLP Interventions Cognitive remediation/compensation, Patient/family education, Internal/external aids, Dysphagia/aspiration precaution training, Therapeutic Activities  TR Interventions    SW/CM Interventions Discharge Planning, Psychosocial Support, Patient/Family Education   Barriers to Discharge MD  Medical stability  Nursing Decreased caregiver support, Home environment access/layout, Incontinence, Lack of/limited family support, Behavior    PT Incontinence, Decreased caregiver support, Behavior    OT      SLP      SW Decreased caregiver support, Lack  of/limited family support     Team Discharge Planning: Destination: PT-Home ,OT- Home , SLP-Home Projected Follow-up: PT-Home health PT, 24 hour supervision/assistance, OT-  Home health OT, SLP-Home Health SLP Projected Equipment Needs: PT-To be determined, OT- To be determined, SLP-None recommended by SLP Equipment Details: PT- , OT-  Patient/family involved in discharge planning: PT- Patient, Family member/caregiver,  OT-Patient, SLP-Patient  MD ELOS: 14-18d Medical Rehab Prognosis:  Good  Assessment:  59 year old female with history of seizure disorder, intellectual delay, esophageal stricture, tremors who was originally admitted on 04/14/20 with fevers, decrease intake X 3 days and mental status changes due to septic shock from pyelonephritis due to E coli UTI. She was treated with antibiotics and hospital course significant for break through seizures as well as delirium with agitation as well as hyperammonemia. Cortak placed for nutritional support and she required pressors for BP support as well as Precedex -->sitter due to agitation. Dr. Dalene Carrow consulted for input on seizures and she was loaded with Keppra. Neurology recommends continuing Valproic acid and depakene as felt to have baseline brief ictal-interictal discharges and no increase in AEDs unless patient has clinical seizures.  ST following for input on dysphagia and as mentation/lethargy improved, was started on dysphagia 1, thins on 11/29. MBS done 11/30 due to hypoxia with concerns of aspiration event. This revealed functional swallow without aspiration and she was advanced to dysphagia 3, thins. Therapy ongoing and patient noted to be debilitated. She continues to have balance deficits with tachycardia with activity as well as delayed processing with cognitive deficits affecting ADL/mobility. CIR recommended due to functional decline.    Now requiring 24/7 Rehab RN,MD, as well as CIR level PT, OT and SLP.  Treatment team will focus  on ADLs and mobility with goals set at supervision See Team Conference Notes for weekly updates to the plan of care

## 2020-05-08 NOTE — Progress Notes (Signed)
Inpatient Rehabilitation  Patient information reviewed and entered into eRehab system by Eugine Bubb M. Marchia Diguglielmo, M.A., CCC/SLP, PPS Coordinator.  Information including medical coding, functional ability and quality indicators will be reviewed and updated through discharge.    

## 2020-05-08 NOTE — Progress Notes (Signed)
Pt returned from xray

## 2020-05-08 NOTE — Progress Notes (Signed)
Patient ID: Maria [name], female   DOB: 04-Sep-1960, 59 y.o.   MRN: [id number]  SW made efforts to meet with pt to complete assessment, however, pt was agitated by questions (diagnosis of MI) and also lethargic. SW to follow-up with family to complete assessment.   SW spoke with pt sister [name] [phone]) to introduce self, explain role, discuss discharge process, and ELOS. SW completed assessment with pt sister. SW confirmed pt mother will be able to provide 24/7 care and physical assistance as needed (retired Therapist, sports). States she has also agreed to transport pt to all medical appointments. Pt brother Herbie Baltimore currently here from [place] and plans to be here after Christmas. SW informed there will be follow-up after team conference with more updates.   Loralee Pacas, MSW, [place] Office: [phone] Cell: [phone] Fax: [phone]

## 2020-05-08 NOTE — Progress Notes (Signed)
Physical Therapy Session Note  Patient Details  Name: Maria [name] MRN: [id number] Date of Birth: 01-05-1961  Today's Date: 05/08/2020 PT Individual Time: 1415-1455 PT Individual Time Calculation (min): 40 min   Short Term Goals: Week 1:  PT Short Term Goal 1 (Week 1): Pt will perform supine<>sit with min assist PT Short Term Goal 2 (Week 1): Pt will perform sit<>stands using LRAD with min assist PT Short Term Goal 3 (Week 1): Pt will perform bed<>chair transfers using LRAD with min assist PT Short Term Goal 4 (Week 1): Pt will ambulate at least 3ft using LRAD with min assist PT Short Term Goal 5 (Week 1): Pt will ascend/descend at least 1 step using LRAD with mod assist Week 2:    Week 3:     Skilled Therapeutic Interventions/Progress Updates:    PAIN  Denies pain this pm Pt initially supine, declines treatment.  States she was "making a cheese sandwich and wants pickles and soda".  Pt then agreed to go w/therapist to get soda. Supine to sit w/min assist, cues.  No post tendency w/this today. SPT bed to wc w/min to mod assist of 1 Pt transported to gym and provided w/sprite. Sit to stand w/rw w/min assist and tactile cues to intitiate/RW Gait 61ft x 2 w/RW cga to min assist, very slow pace, downward gaze, mild post tendency Max cues for safety w/turn/sit to wc.  Gait 37ft to pt room/commode w/RW and cga to min assist.  Commode transfer w/cues for safety, min assist. Pt mod assist for clothing management, cues for perineal hygiene, conitinent of urine. Short distance gait to sink where pt stood w/cga and washed/dried hands. Short distance gait to bed, turn/sit to bed w/rw and mod cues for safety/sequencing w/walker. Sit to supine w/cues, cga. Pt left supine w/rails up x 4, alarm set, bed in lowest position, and needs in reach.  Therapy Documentation Precautions:  Precautions Precautions: Fall Restrictions Weight Bearing Restrictions: No    Therapy/Group: Individual  Therapy  Callie Fielding, PT   Jerrilyn Cairo 05/08/2020, 4:29 PM

## 2020-05-08 NOTE — Progress Notes (Signed)
Patient Details  Name: Maria [name] MRN: [id number] Date of Birth: 11/06/1960  Today's Date: 05/08/2020  Hospital Problems: Principal Problem:   Debility  Past Medical History:  Past Medical History:  Diagnosis Date  . Constipation   . Dysphagia   . Fracture    R foot  . GERD (gastroesophageal reflux disease)   . History of shingles 10/2016  . Mental retardation    lesion in head  . Seizures ([place])    "not fully controlled on max doses of meds" (Neuro ofc note 12/2014)  . Thrombocytopenia ([place])   . Tremor    Past Surgical History:  Past Surgical History:  Procedure Laterality Date  . BIOPSY  09/16/2014   Procedure: BIOPSY;  Surgeon: Rogene Houston, MD;  Location: [facility];  Service: Endoscopy;;  . CATARACT EXTRACTION     both eyes, May of 2015  . COLONOSCOPY    . COLONOSCOPY WITH PROPOFOL N/A 11/20/2018   Procedure: COLONOSCOPY WITH PROPOFOL;  Surgeon: Rogene Houston, MD;  Location: [facility];  Service: Endoscopy;  Laterality: N/A;  . ESOPHAGEAL DILATION N/A 09/16/2014   Procedure: ESOPHAGEAL DILATION WITH 54FR MALONEY DILATOR;  Surgeon: Rogene Houston, MD;  Location: [facility];  Service: Endoscopy;  Laterality: N/A;  . ESOPHAGOGASTRODUODENOSCOPY (EGD) WITH PROPOFOL N/A 09/16/2014   Procedure: ESOPHAGOGASTRODUODENOSCOPY (EGD) WITH PROPOFOL;  Surgeon: Rogene Houston, MD;  Location: [facility];  Service: Endoscopy;  Laterality: N/A;  . MOUTH SURGERY    . POLYPECTOMY  11/20/2018   Procedure: POLYPECTOMY;  Surgeon: Rogene Houston, MD;  Location: [facility];  Service: Endoscopy;;  colon  . Skin graft to gum Right 08/2013  . TONSILLECTOMY AND ADENOIDECTOMY    . TOTAL ABDOMINAL HYSTERECTOMY     Social History:  reports that she quit smoking about 13 years ago. Her smoking use included cigarettes. She has a 22.50 pack-year smoking history. She has never used smokeless tobacco. She reports that she does not drink alcohol and does not use drugs.  Family / Support  Systems Marital Status: Single Spouse/Significant Other: N/A Children: No children Other Supports: mother 58 y.o.) and sister Anticipated Caregiver: mother and sister Maria [name] Ability/Limitations of Caregiver: 24/7 care Caregiver Availability: 24/7 Family Dynamics: Pt lives with her 16 y.o. mother. There is support from her sister Maria [name] to aide with care needs as well. She intends to assist with transportation to medical appointments.  Social History Preferred language: English Religion: Unknown Cultural Background: Pt has mild ID and has not held employment Education: Pt sister unsure on highest grade completed. Read: Yes (sister reports pt can but does not read well.) Write: Yes (sister reports pt can but does not read well.) Employment Status: Disabled Public relations account executive Issues: Denies Guardian/Conservator: N/A   Abuse/Neglect Abuse/Neglect Assessment Can Be Completed: Unable to assess, patient is non-responsive or altered mental status Physical Abuse: Denies Verbal Abuse: Denies Sexual Abuse: Denies Exploitation of patient/patient's resources: Denies Self-Neglect: Denies  Emotional Status Pt's affect, behavior and adjustment status: Pt was lethargic and agitated at time of visit, and became frustrated when SW asked questions Recent Psychosocial Issues: mild ID Psychiatric History: mild ID Substance Abuse History: NOne  Patient / Family Perceptions, Expectations & Goals Pt/Family understanding of illness & functional limitations: Pt family have a general understanding of care needs Premorbid pt/family roles/activities: Independent Anticipated changes in roles/activities/participation: Assistance with IADLs/ADLs  [employer]: None Premorbid Home Care/DME Agencies: None Transportation available at discharge: family Resource referrals recommended:  Neuropsychology  Discharge Planning Living Arrangements: Parent, Other relatives Support  Systems: Parent, Other relatives Type of Residence: Private residence Insurance Resources: Medicare, Multimedia programmer (specify) Sports administrator for [employer]) Financial Resources: Family Support Financial Screen Referred: No Living Expenses: Mortgage Money Management: Family Does the patient have any problems obtaining your medications?: No Care Coordinator Barriers to Discharge: Decreased caregiver support, Lack of/limited family support Care Coordinator Anticipated Follow Up Needs: HH/OP Expected length of stay: 14-24 days  Clinical Impression SW completed assessment with pt sister Maria [name]. Sister Maria [name] has HCPOA and intends to bring in. DME: access to RW; grab bars in shower and by toilet; reports 1 step to enter into home.   Maria Snoke A  [name] 05/08/2020, 3:02 PM

## 2020-05-08 NOTE — Progress Notes (Deleted)
Occupational Therapy Note  Patient Details  Name: Maria [name] MRN: [id number] Date of Birth: 1961-04-15  Today's Date: 05/08/2020 OT Missed Time: 75 Minutes Missed Time Reason: X-Ray  Patient off the floor for chest x-ray. Will follow up per plan of care.   Daneen Schick [name] 05/08/2020, 10:58 AM

## 2020-05-08 NOTE — Progress Notes (Signed)
Speech Language Pathology Daily Session Note  Patient Details  Name: Maria [name] MRN: [id number] Date of Birth: November 06, 1960  Today's Date: 05/08/2020 SLP Individual Time: 0825-0920 SLP Individual Time Calculation (min): 55 min  Short Term Goals: Week 1: SLP Short Term Goal 1 (Week 1): Pt will tolerate trials of dysphagia 4 consistency and thin liquids with no overt s/sx of aspiration or penetration on 9/10 trials. SLP Short Term Goal 2 (Week 1): Pt will demonstrate adequate safety awareness for hospital situation with 85% accuracy min a verbal cues. SLP Short Term Goal 3 (Week 1): Pt will demonstrate orientation x4 using external aids with min A verbal cues. SLP Short Term Goal 4 (Week 1): Pt will sustain attention to functional tasks for 5-6 minutes with min A verbal cues.  Skilled Therapeutic Interventions: Skilled treatment session focused on dysphagia and cognitive goals. SLP facilitated session by providing skilled observation with thin liquids. Patient consumed liquids without overt s/s of aspiration but declined trials of solid textures. Of note, patient with large dry coughing episodes throughout the session that did not appear related to PO intake, physician aware. Patient requested to call her family and required total A visual cues to identify the phone between a field of 2 (remote and call bell) and to dial her mother's number appropriately. Patient sat the phone down with her family still on the line and required Max verbal cues for problem solving regarding saying goodbye and hanging up the phone. Patient appeared mildly restless in bed and required frequent repositioning. Patient left upright in bed with alarm on and all needs within reach. Continue with current plan of care.      Pain No/Denies Pain   Therapy/Group: Individual Therapy  Trevonn Hallum 05/08/2020, 12:39 PM

## 2020-05-08 NOTE — Progress Notes (Signed)
Occupational Therapy Session Note  Patient Details  Name: Maria [name] MRN: [id number] Date of Birth: 1960/12/08  Today's Date: 05/08/2020 OT Individual Time: 2633-3545 OT Individual Time Calculation (min): 20 min  and Today's Date: 05/08/2020 OT Missed Time: 25 Minutes Missed Time Reason: Patient unwilling/refused to participate without medical reason;Patient fatigue   Short Term Goals: Week 1:  OT Short Term Goal 1 (Week 1): Patient will maintain sitting balance at EOB with no more than mod A of 1 person within BADL task OT Short Term Goal 2 (Week 1): Pt will complete 1 step of toileting task OT Short Term Goal 3 (Week 1): Pt will complete sit<>stand with mod A of 1 perosn in preparation for BADL tasks  Skilled Therapeutic Interventions/Progress Updates:    Pt greeted at time of session supine in bed resting and noted to be sleeping but woken with verbal cues, stating she did not want to do therapy or get out of bed. Therapist exited room to bring activities back to pt room for bed level exercises, but upon return pt stating she needed to use the bathroom. Supine to sit Supervision, sit to stand Min A and transferred to [facility] in same manner no AD. Clothing management Mod A to don/doff and performed pericare with supervision in sitting. Transferred back to bed Min A, sit to supine Supervision. Pt then refused further OT session, stating she did not want to do anything else. Missed 25 minutes of OT.   Therapy Documentation Precautions:  Precautions Precautions: Fall Restrictions Weight Bearing Restrictions: No     Therapy/Group: Individual Therapy  Viona Gilmore 05/08/2020, 4:57 PM

## 2020-05-08 NOTE — Progress Notes (Addendum)
[facility] PHYSICAL MEDICINE & REHABILITATION PROGRESS NOTE   Subjective/Complaints: Dry cough, no SOB or CP per pt, oriented to person ot time BMET reviewed ok CXR 1 wk ago showed no acute process ROS: Limited due to cognitive/behavioral    Objective:   No results found. Recent Labs    05/08/20 0725  WBC 6.3  HGB 9.2*  HCT 29.7*  PLT 371   Recent Labs    05/06/20 0450 05/08/20 0725  NA 138 138  K 3.8 3.8  CL 96* 99  CO2 28 26  GLUCOSE 117* 100*  BUN 21* 16  CREATININE 1.06* 1.12*  CALCIUM 9.9 9.7    Intake/Output Summary (Last 24 hours) at 05/08/2020 0914 Last data filed at 05/08/2020 0830 Gross per 24 hour  Intake 1172 ml  Output 700 ml  Net 472 ml     Pressure Injury 04/22/20 Back Medial;Right Stage 2 -  Partial thickness loss of dermis presenting as a shallow open injury with a red, pink wound bed without slough. Medical device pressure injury-shape of lopez valve (Active)  04/22/20 0800  Location: Back  Location Orientation: Medial;Right  Staging: Stage 2 -  Partial thickness loss of dermis presenting as a shallow open injury with a red, pink wound bed without slough.  Wound Description (Comments): Medical device pressure injury-shape of lopez valve  Present on Admission: No    Physical Exam: Vital Signs Blood pressure 138/69, pulse 93, temperature 98.4 F (36.9 C), resp. rate 20, height 5\' 6"  (1.676 m), weight 61.3 kg, SpO2 93 %.  General: No acute distress Mood and affect are appropriate Heart: Regular rate and rhythm no rubs murmurs or extra sounds Lungs: Clear to auscultation, breathing unlabored, no rales or wheezes Abdomen: Positive bowel sounds, soft nontender to palpation, nondistended Extremities: No clubbing, cyanosis, or edema Skin: No evidence of breakdown, no evidence of rash  Neuro: fairly alert. Follows basic commands. Limited insight and awareness. Oriented to person, place, reason. Cranial nerves 2-12 are intact. Sensory exam is  normal. Reflexes are 1+ in all 4's.  . No tremors. Motor function is grossly 3-4/5 (?effort).  Musculoskeletal:Left flank tenderness to palpation     Assessment/Plan: 1. Functional deficits which require 3+ hours per day of interdisciplinary therapy in a comprehensive inpatient rehab setting.  Physiatrist is providing close team supervision and 24 hour management of active medical problems listed below.  Physiatrist and rehab team continue to assess barriers to discharge/monitor patient progress toward functional and medical goals  Care Tool:  Bathing    Body parts bathed by patient: Right arm, Left arm, Chest, Abdomen, Face   Body parts bathed by helper: Right arm, Left arm, Chest, Abdomen, Front perineal area, Buttocks, Right upper leg, Left upper leg, Right lower leg, Left lower leg, Face     Bathing assist Assist Level: Dependent - Patient 0%     Upper Body Dressing/Undressing Upper body dressing   What is the patient wearing?: Pull over shirt    Upper body assist Assist Level: 2 Helpers    Lower Body Dressing/Undressing Lower body dressing      What is the patient wearing?: Incontinence brief     Lower body assist Assist for lower body dressing: 2 Helpers     Toileting Toileting    Toileting assist Assist for toileting: 2 Helpers     Transfers Chair/bed transfer  Transfers assist     Chair/bed transfer assist level: Maximal Assistance - Patient 25 - 49%     Locomotion  Ambulation   Ambulation assist   Ambulation activity did not occur: Safety/medical concerns (required therapeutic intervention to ambulate safely)          Walk 10 feet activity   Assist  Walk 10 feet activity did not occur: Safety/medical concerns        Walk 50 feet activity   Assist Walk 50 feet with 2 turns activity did not occur: Safety/medical concerns         Walk 150 feet activity   Assist Walk 150 feet activity did not occur: Safety/medical concerns          Walk 10 feet on uneven surface  activity   Assist Walk 10 feet on uneven surfaces activity did not occur: Safety/medical concerns         Wheelchair     Assist Will patient use wheelchair at discharge?:  (TBD but do not anticipate)             Wheelchair 50 feet with 2 turns activity    Assist            Wheelchair 150 feet activity     Assist          Blood pressure 138/69, pulse 93, temperature 98.4 F (36.9 C), resp. rate 20, height 5\' 6"  (1.676 m), weight 61.3 kg, SpO2 93 %.  Medical Problem List and Plan: 1.  Impaired mobility and ADLs secondary to left kidney pyelonephritis             -patient may shower             -ELOS/Goals: minA 2-3 weeks  -team to provide encouragement,ego support 2.  Antithrombotics: -DVT/anticoagulation:  Pharmaceutical: Lovenox             -antiplatelet therapy: N/A 3. Headaches/Pain Management: tylenol prn 4. Mood/delirium: LCSW to follow for evaluation and support.              -antipsychotic agents: N/A  12/4-pt on fairly large doses of seroquel currently which it doesn't appear she was on PTA--->reduce day time seroquel to 50mg  and night time dose to 100   12/5 pt was not placed on klonopin for addnl seizure prophylaxis   -will reduce to 0.5mg  TID today which is home dose   -continue same seroquel with plans to reduce these further this week 5. Neuropsych: This patient is not fully capable of making decisions on her own behalf. 6. Skin/Wound Care: Routine pressure relief measures.  7. Fluids/Electrolytes/Nutrition: Monitor I/O. Follow up labs Monday.  8. Seizure disorder: Continue Vimpat 200 mg bid and Depakene 500 mg bid.             -- sleep wake chart to monitor sleep pattern.             --ammonia level nl  -check depakote level /LFT's normal, mild hypoalb  9. Dehydration: Improving. Encourage fluid intake --offer between meals. 10. Leucocytosis: Resolved 6.3 K on 12/6 11. Anemia: Due to  critical illness? H/H has been in 8-9 range. 8.6 on 12/3. Will order anemia panel 12. Tachycardia: Monitor HR tid--on metoprolol bid. Currently better controlled.  14. Stress induced hyperglycemia: Hgb A1C-5.7. CBGs 86-123 on 12/3 15. Constipation: On multiple laxatives including daily suppository.    12/5 -had large liquid bowel movement yesterday     -hold miralax for now 16.  Cough, afeb with nl WBC, mild exp wheeze- chest xray, trial inhaled corticosteroid LOS: 3 days A FACE TO FACE EVALUATION WAS PERFORMED  Maria [name] Maria [name] 05/08/2020, 9:14 AM

## 2020-05-08 NOTE — Care Management (Signed)
Inpatient [facility] Individual Statement of Services  Patient Name:  Maria [name]  Date:  05/08/2020  Welcome to the [facility].  Our goal is to provide you with an individualized program based on your diagnosis and situation, designed to meet your specific needs.  With this comprehensive rehabilitation program, you will be expected to participate in at least 3 hours of rehabilitation therapies Monday-Friday, with modified therapy programming on the weekends.  Your rehabilitation program will include the following services:  Physical Therapy (PT), Occupational Therapy (OT), Speech Therapy (ST), 24 hour per day rehabilitation nursing, Therapeutic Recreaction (TR), Psychology, Neuropsychology, Care Coordinator, Rehabilitation Medicine, Nutrition Services, Pharmacy Services and Other  Weekly team conferences will be held on Tuesdays to discuss your progress.  Your Inpatient Rehabilitation Care Coordinator will talk with you frequently to get your input and to update you on team discussions.  Team conferences with you and your family in attendance may also be held.  Expected length of stay: 17-24 days    Overall anticipated outcome: Supervision  Depending on your progress and recovery, your program may change. Your Inpatient Rehabilitation Care Coordinator will coordinate services and will keep you informed of any changes. Your Inpatient Rehabilitation Care Coordinator's name and contact numbers are listed  below.  The following services may also be recommended but are not provided by the [facility] will be made to provide these services after discharge if needed.  Arrangements include referral to agencies that provide these services.  Your insurance has been verified to be:  Medicare A/B  Your  primary doctor is:  Allyn Kenner  Pertinent information will be shared with your doctor and your insurance company.  Inpatient Rehabilitation Care Coordinator:  Cathleen Corti [phone] or ([phone]  Information discussed with and copy given to patient by: Rana Snare, 05/08/2020, 8:48 AM

## 2020-05-08 NOTE — Progress Notes (Signed)
Occupational Therapy Session Note  Patient Details  Name: Maria [name] MRN: [id number] Date of Birth: 19-May-1961  Today's Date: 05/08/2020 OT Individual Time: 1315-1340 OT Individual Time Calculation (min): 25 min  and Today's Date: 05/08/2020 OT Missed Time: 50 Minutes Missed Time Reason: X-Ray   Short Term Goals: Week 1:  OT Short Term Goal 1 (Week 1): Patient will maintain sitting balance at EOB with no more than mod A of 1 person within BADL task OT Short Term Goal 2 (Week 1): Pt will complete 1 step of toileting task OT Short Term Goal 3 (Week 1): Pt will complete sit<>stand with mod A of 1 perosn in preparation for BADL tasks  Skilled Therapeutic Interventions/Progress Updates:    Patient initially missed part of her therapy time 2/2 being off the unit for x-ray. OT returned as patient available.   Patient greeted semi-reclined in bed with eyes closed. Pt initially very resistive to therapy. Pt shaking her head and saying that she did not want to do anything. OT able to talk pt into getting dressed. OT handed pt her pants and she was able to thread them at bed level with min A. Pt then was able to bridge bottom and pull them up with min A. Pt resistive to sitting EOB, but eventually agreed so that she could put her shirt on. Pt needed max A for bed mobility. Improved sitting balance this session and was able to sit EOB while donning shirt with 75% supervision and only intermittent CGA 2/2 posterior LOB. Pt completed 1 sit<> stand with RW and mod A. She then took side steps along EOB with mod A and assist for RW management. Pt began loosing balance posteriorly and abruptly returned to sitting at EOB. Pt declined any further participation. Pt left semi-reclined in bed with bed alarm on, call bell in reach and needs met.   Therapy Documentation Precautions:  Precautions Precautions: Fall Restrictions Weight Bearing Restrictions: No General: General OT Amount of Missed Time: 50  Minutes Pain:  denies pain   Therapy/Group: Individual Therapy  Valma Cava 05/08/2020, 1:34 PM

## 2020-05-09 ENCOUNTER — Inpatient Hospital Stay (HOSPITAL_COMMUNITY): Payer: Medicare Other | Admitting: Occupational Therapy

## 2020-05-09 ENCOUNTER — Inpatient Hospital Stay (HOSPITAL_COMMUNITY): Payer: Medicare Other

## 2020-05-09 ENCOUNTER — Inpatient Hospital Stay (HOSPITAL_COMMUNITY): Payer: Medicare Other | Admitting: Speech Pathology

## 2020-05-09 ENCOUNTER — Encounter (HOSPITAL_COMMUNITY): Payer: Medicare Other | Admitting: Psychology

## 2020-05-09 LAB — GLUCOSE, CAPILLARY
Glucose-Capillary: 92 mg/dL (ref 70–99)
Glucose-Capillary: 104 mg/dL — ABNORMAL HIGH (ref 70–99)
Glucose-Capillary: 98 mg/dL (ref 70–99)
Glucose-Capillary: 123 mg/dL — ABNORMAL HIGH (ref 70–99)

## 2020-05-09 MED ORDER — QUETIAPINE FUMARATE 25 MG PO TABS
25.0000 mg | ORAL_TABLET | Freq: Every day | ORAL | Status: DC
  Administered 2020-05-10 – 2020-05-12 (×3): 25 mg via ORAL
  Filled 2020-05-09 (×3): qty 1

## 2020-05-09 MED ORDER — QUETIAPINE FUMARATE 50 MG PO TABS
75.0000 mg | ORAL_TABLET | Freq: Every day | ORAL | Status: DC
  Administered 2020-05-09 – 2020-05-11 (×2): 75 mg via ORAL
  Filled 2020-05-09 (×2): qty 1

## 2020-05-09 NOTE — Progress Notes (Signed)
Physical Therapy Session Note  Patient Details  Name: Maria [name] MRN: [id number] Date of Birth: 1961-05-24  Today's Date: 05/09/2020 PT Individual Time: 3159-4585 PT Individual Time Calculation (min): 70 min   Short Term Goals: Week 1:  PT Short Term Goal 1 (Week 1): Pt will perform supine<>sit with min assist PT Short Term Goal 2 (Week 1): Pt will perform sit<>stands using LRAD with min assist PT Short Term Goal 3 (Week 1): Pt will perform bed<>chair transfers using LRAD with min assist PT Short Term Goal 4 (Week 1): Pt will ambulate at least 95ft using LRAD with min assist PT Short Term Goal 5 (Week 1): Pt will ascend/descend at least 1 step using LRAD with mod assist  Skilled Therapeutic Interventions/Progress Updates:     Pt received supine in bed with no complaint of pain. Pt says that she is not interested in participating in therapy and wants to stay in bed. PT provides education on benefits of participation versus risks of bed rest, and provides gradual redirection and encouragement to participate. Eventually pt is agreeable. Supine to sit supervision and cues on positioning at EOB for safety. Stand pivot transfer to [facility] with minA. WC transport to gym for time management. Pt paricipates in NMR for standing balance and activity tolerance with cognitive overlay, playing "Sorry" game in standing with PT provided CGA and cues for posture. Pt takes several seated rest breaks during game. Pt requests to return to room multiple times Sit to stand with CGA and RW. Pt ambulates 150' with minA and RW, taking very short stride lengths with forward flexed posture. During seated rest break pt perseverating on wanting to go back to room and not wanting to walk anymore. PT able to redirect pt and convince pt to ambulate back to room, 120', with RW. Pt performs toilet transfers with minA. Pericare performed independently. Ambulatory transfer back to bed and pt performs sit to supine with minA. Left with  alarm intact and all needs within reach.  Therapy Documentation Precautions:  Precautions Precautions: Fall Restrictions Weight Bearing Restrictions: No    Therapy/Group: Individual Therapy  Breck Coons, PT, DPT 05/09/2020, 3:42 PM

## 2020-05-09 NOTE — Progress Notes (Signed)
[facility] PHYSICAL MEDICINE & REHABILITATION PROGRESS NOTE   Subjective/Complaints: Still with cough. Comfortable. Denies pain  ROS: Limited due to cognitive/behavioral    Objective:   DG Chest 2 View  Result Date: 05/08/2020 CLINICAL DATA:  Reason for exam: cough, persistent Patient showed difficulty verifying name and dob. Denies any sob or chest pains or cough. Patient had dry cough during exam. EXAM: CHEST - 2 VIEW COMPARISON:  05/01/2020 FINDINGS: Lungs are clear.  Interval removal of feeding tube and PICC line. Heart size and mediastinal contours are within normal limits. No effusion.  No pneumothorax. Visualized bones unremarkable. IMPRESSION: No acute cardiopulmonary disease. Electronically Signed   By: Lucrezia Europe M.D.   On: 05/08/2020 10:49   Recent Labs    05/08/20 0725  WBC 6.3  HGB 9.2*  HCT 29.7*  PLT 371   Recent Labs    05/08/20 0725  NA 138  K 3.8  CL 99  CO2 26  GLUCOSE 100*  BUN 16  CREATININE 1.12*  CALCIUM 9.7    Intake/Output Summary (Last 24 hours) at 05/09/2020 1057 Last data filed at 05/09/2020 0730 Gross per 24 hour  Intake 596 ml  Output --  Net 596 ml     Pressure Injury 04/22/20 Back Medial;Right Stage 2 -  Partial thickness loss of dermis presenting as a shallow open injury with a red, pink wound bed without slough. Medical device pressure injury-shape of lopez valve (Active)  04/22/20 0800  Location: Back  Location Orientation: Medial;Right  Staging: Stage 2 -  Partial thickness loss of dermis presenting as a shallow open injury with a red, pink wound bed without slough.  Wound Description (Comments): Medical device pressure injury-shape of lopez valve  Present on Admission: No    Physical Exam: Vital Signs Blood pressure 113/68, pulse 86, temperature 98.1 F (36.7 C), resp. rate 20, height 5\' 6"  (1.676 m), weight 61.3 kg, SpO2 94 %.  Constitutional: No distress . Vital signs reviewed. HEENT: EOMI, oral membranes moist Neck:  supple Cardiovascular: RRR without murmur. No JVD    Respiratory/Chest: occasional rhonchi.  Normal effort    GI/Abdomen: BS +, non-tender, non-distended Ext: no clubbing, cyanosis, or edema Psych: doesn't engage much Skin: small back wound  Neuro: sl more alert. Follows basic commands. Limited insight and awareness. Oriented to person, place, reason. Cranial nerves 2-12 are intact. Sensory exam is normal. Reflexes are 1+ in all 4's.  . No tremors. Motor function is grossly 3-4/5.   Musculoskeletal:Left flank tenderness to palpation     Assessment/Plan: 1. Functional deficits which require 3+ hours per day of interdisciplinary therapy in a comprehensive inpatient rehab setting.  Physiatrist is providing close team supervision and 24 hour management of active medical problems listed below.  Physiatrist and rehab team continue to assess barriers to discharge/monitor patient progress toward functional and medical goals  Care Tool:  Bathing    Body parts bathed by patient: Right arm, Left arm, Chest, Abdomen, Face   Body parts bathed by helper: Right arm, Left arm, Chest, Abdomen, Front perineal area, Buttocks, Right upper leg, Left upper leg, Right lower leg, Left lower leg, Face     Bathing assist Assist Level: Dependent - Patient 0%     Upper Body Dressing/Undressing Upper body dressing   What is the patient wearing?: Pull over shirt    Upper body assist Assist Level: 2 Helpers    Lower Body Dressing/Undressing Lower body dressing      What is the patient  wearing?: Incontinence brief     Lower body assist Assist for lower body dressing: 2 Helpers     Toileting Toileting    Toileting assist Assist for toileting: Moderate Assistance - Patient 50 - 74%     Transfers Chair/bed transfer  Transfers assist     Chair/bed transfer assist level: Moderate Assistance - Patient 50 - 74%     Locomotion Ambulation   Ambulation assist   Ambulation activity did not  occur: Safety/medical concerns (required therapeutic intervention to ambulate safely)  Assist level: Minimal Assistance - Patient > 75% Assistive device: Walker-rolling Max distance: 30   Walk 10 feet activity   Assist  Walk 10 feet activity did not occur: Safety/medical concerns  Assist level: Minimal Assistance - Patient > 75% Assistive device: Walker-rolling   Walk 50 feet activity   Assist Walk 50 feet with 2 turns activity did not occur: Safety/medical concerns         Walk 150 feet activity   Assist Walk 150 feet activity did not occur: Safety/medical concerns         Walk 10 feet on uneven surface  activity   Assist Walk 10 feet on uneven surfaces activity did not occur: Safety/medical concerns         Wheelchair     Assist Will patient use wheelchair at discharge?:  (TBD but do not anticipate)             Wheelchair 50 feet with 2 turns activity    Assist            Wheelchair 150 feet activity     Assist          Blood pressure 113/68, pulse 86, temperature 98.1 F (36.7 C), resp. rate 20, height 5\' 6"  (1.676 m), weight 61.3 kg, SpO2 94 %.  Medical Problem List and Plan: 1.  Impaired mobility and ADLs secondary to left kidney pyelonephritis             -patient may shower             -ELOS/Goals: minA 2-3 weeks---team conference today 2.  Antithrombotics: -DVT/anticoagulation:  Pharmaceutical: Lovenox             -antiplatelet therapy: N/A 3. Headaches/Pain Management: tylenol prn 4. Mood/delirium: LCSW to follow for evaluation and support.              -antipsychotic agents: N/A  12/4-pt on fairly large doses of seroquel currently which it doesn't appear she was on PTA--->reduce day time seroquel to 50mg  and night time dose to 100   12/5 pt was not placed on klonopin for addnl seizure prophylaxis   -will reduce to 0.5mg  TID today which is home dose   -continue same seroquel with plans to reduce these further this  week  12/7 reduce seroquel to 75mg  qhs and 25mg  qam     -reduce klonopin to 0.5mg  bid 5. Neuropsych: This patient is not fully capable of making decisions on her own behalf. 6. Skin/Wound Care: Routine pressure relief measures.  7. Fluids/Electrolytes/Nutrition: Monitor I/O. Follow up labs Monday.  8. Seizure disorder: Continue Vimpat 200 mg bid and Depakene 500 mg bid.             -- sleep wake chart to monitor sleep pattern.             --ammonia level nl  -depakote level 51   9. Dehydration: Improving. Encourage fluid intake --offer between meals. 10. Leucocytosis: Resolved 6.3  K on 12/6 11. Anemia: Due to critical illness? H/H has been in 8-9 range. 8.6 on 12/3. Will order anemia panel 12. Tachycardia: Monitor HR tid--on metoprolol bid. Currently better controlled.  14. Stress induced hyperglycemia: Hgb A1C-5.7. CBGs 86-123 on 12/3 15. Constipation: On multiple laxatives including daily suppository.    12/5 -had large liquid bowel movement yesterday     -hold miralax for now 16.  Cough, afeb with nl WBC, mild exp wheezes, rhonchi  -dulera, duoneb, robitussin dm  -IS, OOB LOS: 4 days A FACE TO FACE EVALUATION WAS PERFORMED  Meredith Staggers 05/09/2020, 10:57 AM

## 2020-05-09 NOTE — Consult Note (Signed)
Neuropsychological Consultation   Patient:   Maria [name]   DOB:   [birth year]  MR Number:  [id number]  Location:  [facility] [facility] [place] [id number] [place] [address] Dept: [place]: [phone]           Date of Service:   05/09/2020  Start Time:   4 PM End Time:   5 PM  Provider/Observer:  Ilean Skill, Psy.D.       Clinical Neuropsychologist       Billing Code/Service: [phone]  Chief Complaint:    Maria [name] is a 59 year old female with history of seizure disorder, intellectual delay and impairment, esophageal stricture, tremors.  Patient was admitted on 04/14/2020 with fevers, decreased food intake over the prior 3 days and mental status changes.  Patient was diagnosed with septic shock from pyelonephritis due to E. coli UTI.  Patient treated with antibiotics.  Hospital course complicated by breakthrough seizure as well as delirium and agitation as well as hyperammonemia.  Patient loaded with Keppra due to breakthrough seizures.  Neurology recommended continuing valproic acid and Depakote.  Patient with persistent cough.  Patient noted to be debilitated and was recommended for follow-up with comprehensive inpatient rehabilitation program due to functional decline.  Reason for Service:  Patient was referred for neuropsychological consultation due to coping and adjustment issues with significant medical illness following septic shock and breakthrough seizure.  Patient has significant intellectual deficits.  Below is the HPI for the current admission.  HPI: [name] is a 59 year old female with history of seizure disorder, intellectual delay, esophageal stricture, tremors who was originally admitted on 04/14/20 with fevers, decrease intake X 3 days and mental status changes due to septic shock from pyelonephritis due to E coli UTI. She was treated with antibiotics and hospital course  significant for break through seizures as well as delirium with agitation as well as hyperammonemia. Cortak placed for nutritional support and she required pressors for BP support as well as Precedex -->sitter due to agitation. Dr. Dalene Carrow consulted for input on seizures and she was loaded with Keppra. Neurology recommends continuing Valproic acid and depakene as felt to have baseline brief ictal-interictal discharges and no increase in AEDs unless patient has clinical seizures.  ST following for input on dysphagia and as mentation/lethargy improved, was started on dysphagia 1, thins on 11/29. MBS done 11/30 due to hypoxia with concerns of aspiration event. This revealed functional swallow without aspiration and she was advanced to dysphagia 3, thins. Therapy ongoing and patient noted to be debilitated. She continues to have balance deficits with tachycardia with activity as well as delayed processing with cognitive deficits affecting ADL/mobility. CIR recommended due to functional decline.   Current Status:  Upon entering the room, the patient was asleep and was mildly difficult to arouse.  She never became fully alert during my visit.  Patient with persistent cough and reduced awareness of her surroundings at times.  Patient was unable to articulate describe exactly what had been going on with her medically and was still rather confused but it was unclear how much of this was due to her intellectual deficits versus residual effects of her medical illness/breakthrough seizures etc.  Behavioral Observation: Maria [name]  presents as a 59 y.o.-year-old Right Caucasian Female who appeared her stated age. her dress was Appropriate and she was Well Groomed and her manners were Appropriate to the situation.  her participation was  indicative of Appropriate and Inattentive behaviors.  There were any physical disabilities noted.  she displayed an appropriate level of cooperation and motivation.     Interactions:     Minimal Drowsy and Inattentive  Attention:   abnormal and attention span appeared shorter than expected for age  Memory:   abnormal; remote memory intact, recent memory impaired  Visuo-spatial:  not examined  Speech (Volume):  low  Speech:   garbled; slurred  Thought Process:  Circumstantial  Though Content:  WNL; not suicidal and not homicidal  Orientation:   person and place  Judgment:   Poor  Planning:   Poor  Affect:    Flat and Lethargic  Mood:    Dysphoric  Insight:   Shallow  Intelligence:   low  Medical History:   Past Medical History:  Diagnosis Date  . Constipation   . Dysphagia   . Fracture    R foot  . GERD (gastroesophageal reflux disease)   . History of shingles 10/2016  . Mental retardation    lesion in head  . Seizures ([place])    "not fully controlled on max doses of meds" (Neuro ofc note 12/2014)  . Thrombocytopenia ([place])   . Tremor          Patient Active Problem List   Diagnosis Date Noted  . Debility 05/05/2020  . Pressure injury of skin 04/24/2020  . Palliative care by specialist   . Pyelonephritis of left kidney 04/14/2020  . Renal abscess 04/14/2020  . Leukocytosis 04/14/2020  . Fever 04/14/2020  . LLQ abdominal pain 04/14/2020  . Dysphagia   . AKI (acute kidney injury) ([facility])   . Sepsis secondary to UTI ([facility])   . Pain in left ankle and joints of left foot 12/02/2019  . Positive colorectal cancer screening using Cologuard test 10/28/2018  . Posterior tibial tendinitis, right leg 04/23/2016  . Pain in right foot 04/12/2016  . Acute encephalopathy   . Myoclonus   . Frequent falls 11/09/2015  . Fall 11/09/2015  . Chest pain 03/16/2014  . Upper abdominal pain 03/16/2014  . Seizure disorder ([place]) 03/16/2014  . 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 Generalized convulsive epilepsy ([place]) 10/06/2012  . DNR (do not resuscitate) discussion 10/06/2012  . Intellectual disability   . Thrombocytopenia ([facility]) 05/23/2011  .  Seizures ([facility]) 03/21/2011  . GERD (gastroesophageal reflux disease) 03/21/2011  . CLOSED FRACTURE OF ACROMIAL END OF CLAVICLE 07/13/2008              Psychiatric History:  Patient with history of intellectual disability and chronic seizure disorder.  Family Med/Psych History:  Family History  Problem Relation Age of Onset  . High Cholesterol Mother   . High blood pressure Mother   . Diabetes Father     Impression/DX:  AmeLie L. Kirshner is a 59 year old female with history of seizure disorder, intellectual delay and impairment, esophageal stricture, tremors.  Patient was admitted on 04/14/2020 with fevers, decreased food intake over the prior 3 days and mental status changes.  Patient was diagnosed with septic shock from pyelonephritis due to E. coli UTI.  Patient treated with antibiotics.  Hospital course complicated by breakthrough seizure as well as delirium and agitation as well as hyperammonemia.  Patient loaded with Keppra due to breakthrough seizures.  Neurology recommended continuing valproic acid and Depakote.  Patient with persistent cough.  Patient noted to be debilitated and was recommended for follow-up with comprehensive inpatient rehabilitation program due to functional decline.  Upon entering the room, the patient was asleep and was mildly difficult to arouse.  She never became fully alert during my visit.  Patient with persistent cough and reduced awareness of her surroundings at times.  Patient was unable to articulate describe exactly what had been going on with her medically and was still rather confused but it was unclear how much of this was due to her intellectual deficits versus residual effects of her medical illness/breakthrough seizures etc.    Diagnosis:    Debility with history of seizure and breakthrough seizures recently with medical illness       Electronically Signed   _______________________ Ilean Skill, Psy.D. Clinical Neuropsychologist

## 2020-05-09 NOTE — Progress Notes (Signed)
Speech Language Pathology Daily Session Note  Patient Details  Name: Maria [name] MRN: [id number] Date of Birth: Aug 17, 1960  Today's Date: 05/09/2020 SLP Individual Time: 0715-0810 SLP Individual Time Calculation (min): 55 min  Short Term Goals: Week 1: SLP Short Term Goal 1 (Week 1): Pt will tolerate trials of dysphagia 4 consistency and thin liquids with no overt s/sx of aspiration or penetration on 9/10 trials. SLP Short Term Goal 2 (Week 1): Pt will demonstrate adequate safety awareness for hospital situation with 85% accuracy min a verbal cues. SLP Short Term Goal 3 (Week 1): Pt will demonstrate orientation x4 using external aids with min A verbal cues. SLP Short Term Goal 4 (Week 1): Pt will sustain attention to functional tasks for 5-6 minutes with min A verbal cues.  Skilled Therapeutic Interventions: Skilled treatment session focused on dysphagia and cognitive goals. SLP facilitated session by providing skilled observation with breakfast meal of Dys. 3 textures with thin liquids. Patient consumed meal without overt s/s of aspiration and required supervision verbal cues for use of small sips via straw. Patient also consumed medications whole with thin without difficulty with the exception of gagging when given a large pill. Recommend cutting the pill in half to maximize safety. Patient demonstrated improved social and verbal engagement today. Patient asked orientation questions but declined visual aids when offered. Patient demonstrated sustained attention to self-feeding for ~30 minutes with supervision verbal cues needed for redirection. Patient left upright in bed with alarm on and all needs within reach. Continue with current plan of care.      Pain No/Denies Pain   Therapy/Group: Individual Therapy  Jerrit Horen 05/09/2020, 8:13 AM

## 2020-05-09 NOTE — Progress Notes (Signed)
Occupational Therapy Session Note  Patient Details  Name: Maria [name] MRN: [id number] Date of Birth: February 28, 1961  Today's Date: 05/09/2020 OT Individual Time: 4268-3419  &   [id number] OT Individual Time Calculation (min): 45 min   &   32 min   Short Term Goals: Week 1:  OT Short Term Goal 1 (Week 1): Patient will maintain sitting balance at EOB with no more than mod A of 1 person within BADL task OT Short Term Goal 2 (Week 1): Pt will complete 1 step of toileting task OT Short Term Goal 3 (Week 1): Pt will complete sit<>stand with mod A of 1 perosn in preparation for BADL tasks  Skilled Therapeutic Interventions/Progress Updates:    session 1:   Patient in bed, she denies pain but did experience left shoulder discomfort with change of position - attempted to engage her in light stretch and AROM activities which she declines to attempt.  Patient requires increased time, encouragement and strategies to complete activities.  She demonstrates ability to move from lying to sitting edge of bed with CGA.  Sit to stand from bed surface with CGA.  UB dressing with set up/min A, LB dressing mod A.  SPT and ambulation with RW CGA/min A to/from bed and toilet, toileting with mod A for hygiene, CGA to stand at sink for hand hygiene.  She returned to bed at close of session with CGA, bed alarm set and call bell in hand (phone in room not functioning properly and replaced - she requires assistance to dial phone to call mother)   Session 2:   Patient in bed, alert with no pain.  Ongoing encouragement and time needed to engage in activity.  She was able to move to sitting position with CS.  She tolerated unsupported sitting edge of bed for 10 minutes with CS.  Sit to stand and ambulation with RW to/from bed and toilet with CGA.  She completed clothing management and hygiene after continent urination with CGA.  Hand hygiene in stance at sink CGA.  She returned to bed with CS, able to doff slipper socks with CS.   She remained in bed at close of session, bed alarm set and call bell in hand.      Therapy Documentation Precautions:  Precautions Precautions: Fall Restrictions Weight Bearing Restrictions: No  Therapy/Group: Individual Therapy  Carlos Levering 05/09/2020, 7:37 AM

## 2020-05-09 NOTE — Patient Care Conference (Signed)
Inpatient RehabilitationTeam Conference and Plan of Care Update Date: 05/09/2020   Time: 10:50 AM    Patient Name: Maria [name] Record Number: [id number]  Date of Birth: 12-Jan-1961 Sex: Female         Room/Bed: 4W20C/4W20C-01 Payor Info: Payor: MEDICARE / Plan: MEDICARE PART A AND B / Product Type: *No Product type* /    Admit Date/Time:  05/05/2020  4:37 PM  Primary Diagnosis:  [facility] Problems: Principal Problem:   Debility    Expected Discharge Date: Expected Discharge Date: 05/25/20  Team Members Present: Physician leading conference: Dr. Alger Simons Care Coodinator Present: Loralee Pacas, LCSWA;Chief Walkup Creig Hines, RN, BSN, [place] Nurse Present: Barnabas Lister, RN PT Present: Tereasa Coop, PT OT Present: Elisabeth Most, OT SLP Present: Weston Anna, SLP PPS Coordinator present : Gunnar Fusi, SLP     Current Status/Progress Goal Weekly Team Focus  Bowel/Bladder   pt is cont/incont, LBM 12/6  Pt will regain continence  Q2h toileting/ PRN   Swallow/Nutrition/ Hydration   Dys. 3 textures with thin liquids, supervision  Mod I  tolerance of current diet, trials of regulat textures   ADL's   Max- poor participation  Supervision- may be lofty, need to clarify PLOF with mother  transfers, activity tolerance, sitting balance, sit<>stands, standing balance, self-care retraining   Mobility   modA bed mobility, transfers, and ambulation up to 79' with RW. Posterior lean in standing and limited participation.  Supervision  balance, ambulation, transfers, consistency of participation   Communication             Safety/Cognition/ Behavioral Observations  Min A  Supervision  basic orientation, attention   Pain   Pt denies pain at this time  Pt will remain free of pain  Assess pain Qshift/PRN   Skin   Wound report on the pts back. Pt refused to let this nurse assess it.  Pt will be free from skin breakdown/infection.  Assess skin qshift/prn      Discharge Planning:  Pt to d/c to home with 58 y.o. mother who is a retired Therapist, sports and physically able to provide care. Pt sister intends to transport pt to all medical appointments, and provide assistance as needed.   Team Discussion: No complaints of pain, continent B/B with incontinent episodes. OT reports patient is supervision at EOB, min assist with upper body dressing, set-up for lower body dressing. She can stand at the sink to wash her hands. PT reports patient is a mod assist for bed mobility, transfers, and ambulation. She has a posterior lean. SLP reports patient doesn't like to eat but will drink fluids. They are working on PO intake and swallowing. MD reports her chest x-ray was clear. Family hopes she can come home at a supervision level. Patient on target to meet rehab goals: yes  *See Care Plan and progress notes for long and short-term goals.   Revisions to Treatment Plan:  Not at this time.  Teaching Needs: Continue family education.  Current Barriers to Discharge: Decreased caregiver support, Home enviroment access/layout, Incontinence, Lack of/limited family support, Behavior and Nutritional means  Possible Resolutions to Barriers: Continue current medications, offer nutritional supplementation, timed toileting schedule, provide emotional support to patient and family.     Medical Summary Current Status: debility after urosepsis, baseline intellectual delay, new cough with clear cxr. on sz prophlaxis, still taking meds for delirium which are causing lethargy  Barriers to Discharge: Behavior;Medical stability   Possible Resolutions to  Barriers/Weekly Focus: weaning neuro-sedating meds. encouraging her to get OOB, daily review of nutrition and lab data/vs   Continued Need for Acute Rehabilitation Level of Care: The patient requires daily medical management by a physician with specialized training in physical medicine and rehabilitation for the following  reasons: Direction of a multidisciplinary physical rehabilitation program to maximize functional independence : Yes Medical management of patient stability for increased activity during participation in an intensive rehabilitation regime.: Yes Analysis of laboratory values and/or radiology reports with any subsequent need for medication adjustment and/or medical intervention. : Yes   I attest that I was present, lead the team conference, and concur with the assessment and plan of the team.   Cristi Loron 05/09/2020, 2:21 PM

## 2020-05-09 NOTE — Progress Notes (Signed)
Patient ID: Maria [name], female   DOB: Dec 04, 1960, 59 y.o.   MRN: [id number]  SW met with pt in room to inform on d/c date 12/23. Pt aware SW to follow-up with her sister [name]. SW spoke with pt sister [name] [phone]) to provide updates from team conference, and informed on next week will discuss family education. SW to follow-up after team conference.   Loralee Pacas, MSW, [place] Office: [phone] Cell: [phone] Fax: [phone]

## 2020-05-10 ENCOUNTER — Inpatient Hospital Stay (HOSPITAL_COMMUNITY): Payer: Medicare Other

## 2020-05-10 ENCOUNTER — Inpatient Hospital Stay (HOSPITAL_COMMUNITY): Payer: Medicare Other | Admitting: Occupational Therapy

## 2020-05-10 ENCOUNTER — Inpatient Hospital Stay (HOSPITAL_COMMUNITY): Payer: Medicare Other | Admitting: Speech Pathology

## 2020-05-10 LAB — URINALYSIS, COMPLETE (UACMP) WITH MICROSCOPIC
Bilirubin Urine: NEGATIVE
Glucose, UA: NEGATIVE mg/dL
Ketones, ur: NEGATIVE mg/dL
Nitrite: POSITIVE — AB
Protein, ur: 100 mg/dL — AB
RBC / HPF: >50 RBC/hpf — ABNORMAL HIGH (ref 0–5)
Specific Gravity, Urine: 1.011 (ref 1.005–1.030)
WBC, UA: >50 WBC/hpf — ABNORMAL HIGH (ref 0–5)
pH: 6 (ref 5.0–8.0)

## 2020-05-10 LAB — CBC WITH DIFFERENTIAL/PLATELET
Abs Immature Granulocytes: 0.04 10*3/uL (ref 0.00–0.07)
Basophils Absolute: 0 10*3/uL (ref 0.0–0.1)
Basophils Relative: 0 %
Eosinophils Absolute: 0 10*3/uL (ref 0.0–0.5)
Eosinophils Relative: 0 %
HCT: 30.4 % — ABNORMAL LOW (ref 36.0–46.0)
Hemoglobin: 9.8 g/dL — ABNORMAL LOW (ref 12.0–15.0)
Immature Granulocytes: 0 %
Lymphocytes Relative: 10 %
Lymphs Abs: 1.1 10*3/uL (ref 0.7–4.0)
MCH: 30.1 pg (ref 26.0–34.0)
MCHC: 32.2 g/dL (ref 30.0–36.0)
MCV: 93.3 fL (ref 80.0–100.0)
Monocytes Absolute: 1 10*3/uL (ref 0.1–1.0)
Monocytes Relative: 9 %
Neutro Abs: 8.1 10*3/uL — ABNORMAL HIGH (ref 1.7–7.7)
Neutrophils Relative %: 81 %
Platelets: 337 10*3/uL (ref 150–400)
RBC: 3.26 MIL/uL — ABNORMAL LOW (ref 3.87–5.11)
RDW: 14.8 % (ref 11.5–15.5)
WBC: 10.2 10*3/uL (ref 4.0–10.5)
nRBC: 0 % (ref 0.0–0.2)

## 2020-05-10 LAB — GLUCOSE, CAPILLARY
Glucose-Capillary: 115 mg/dL — ABNORMAL HIGH (ref 70–99)
Glucose-Capillary: 129 mg/dL — ABNORMAL HIGH (ref 70–99)
Glucose-Capillary: 114 mg/dL — ABNORMAL HIGH (ref 70–99)

## 2020-05-10 NOTE — Progress Notes (Signed)
Pt continues to c/o LLQ pain; Increased with palpation, and nausea. Pt denies needing to have BM. PRN compazine given but not effective. Pt Sitting up in bed, no signs of distress at this time.

## 2020-05-10 NOTE — Progress Notes (Signed)
SLP Cancellation Note  Patient Details Name: Maria [name] MRN: [id number] DOB: 22-Feb-1961   Cancelled treatment:       Patient missed 60 minutes of skilled SLP intervention due to not feeling well. Patient reporting stomach pain and declining breakfast or any functional activity at this time. Physician aware. Will re-attemot as able. Continue with current plan of care.                                                                                                 [place], [place] 05/10/2020, 9:46 AM

## 2020-05-10 NOTE — Progress Notes (Signed)
Unable to collect stool for occult today. Pt also refused suppository this evening after being asked twice.  Sheela Stack, LPN

## 2020-05-10 NOTE — Progress Notes (Signed)
PHYSICAL MEDICINE & REHABILITATION PROGRESS NOTE   Subjective/Complaints: C/o abdominal pain since last night. Associated nausea, won't specify much more. Doesn't need to move bowels.   ROS: Limited due to cognitive/behavioral   Objective:   DG Chest 2 View  Result Date: 05/08/2020 CLINICAL DATA:  Reason for exam: cough, persistent Patient showed difficulty verifying name and dob. Denies any sob or chest pains or cough. Patient had dry cough during exam. EXAM: CHEST - 2 VIEW COMPARISON:  05/01/2020 FINDINGS: Lungs are clear.  Interval removal of feeding tube and PICC line. Heart size and mediastinal contours are within normal limits. No effusion.  No pneumothorax. Visualized bones unremarkable. IMPRESSION: No acute cardiopulmonary disease. Electronically Signed   By: Lucrezia Europe M.D.   On: 05/08/2020 10:49   Recent Labs    05/08/20 0725  WBC 6.3  HGB 9.2*  HCT 29.7*  PLT 371   Recent Labs    05/08/20 0725  NA 138  K 3.8  CL 99  CO2 26  GLUCOSE 100*  BUN 16  CREATININE 1.12*  CALCIUM 9.7    Intake/Output Summary (Last 24 hours) at 05/10/2020 0847 Last data filed at 05/10/2020 0341 Gross per 24 hour  Intake 1316 ml  Output --  Net 1316 ml     Pressure Injury 04/22/20 Back Medial;Right Stage 2 -  Partial thickness loss of dermis presenting as a shallow open injury with a red, pink wound bed without slough. Medical device pressure injury-shape of lopez valve (Active)  04/22/20 0800  Location: Back  Location Orientation: Medial;Right  Staging: Stage 2 -  Partial thickness loss of dermis presenting as a shallow open injury with a red, pink wound bed without slough.  Wound Description (Comments): Medical device pressure injury-shape of lopez valve  Present on Admission: No    Physical Exam: Vital Signs Blood pressure (!) 141/79, pulse 90, temperature 99 F (37.2 C), temperature source Oral, resp. rate 17, height 5\' 6"  (1.676 m), weight 61.3 kg, SpO2 93  %.  Constitutional: No distress . Vital signs reviewed. HEENT: EOMI, oral membranes moist Neck: supple Cardiovascular: RRR without murmur. No JVD    Respiratory/Chest: CTA Bilaterally without wheezes or rales. Normal effort    GI/Abdomen: BS +, ND, TTP upper half Ext: no clubbing, cyanosis, or edema Psych: flat, distracted Skin: small back wound  Neuro: sl more alert. Follows basic commands. Limited insight and awareness. Oriented to person, place, reason. Cranial nerves 2-12 are intact. Sensory exam is normal. Reflexes are 1+ in all 4's.  . No tremors. Motor function is grossly 3-4/5.   Musculoskeletal:Left flank tenderness to palpation     Assessment/Plan: 1. Functional deficits which require 3+ hours per day of interdisciplinary therapy in a comprehensive inpatient rehab setting.  Physiatrist is providing close team supervision and 24 hour management of active medical problems listed below.  Physiatrist and rehab team continue to assess barriers to discharge/monitor patient progress toward functional and medical goals  Care Tool:  Bathing    Body parts bathed by patient: Right arm, Left arm, Chest, Abdomen, Face   Body parts bathed by helper: Right arm, Left arm, Chest, Abdomen, Front perineal area, Buttocks, Right upper leg, Left upper leg, Right lower leg, Left lower leg, Face     Bathing assist Assist Level: Dependent - Patient 0%     Upper Body Dressing/Undressing Upper body dressing   What is the patient wearing?: Pull over shirt    Upper body assist Assist Level: Supervision/Verbal  cueing    Lower Body Dressing/Undressing Lower body dressing      What is the patient wearing?: Pants, Incontinence brief     Lower body assist Assist for lower body dressing: Moderate Assistance - Patient 50 - 74%     Toileting Toileting    Toileting assist Assist for toileting: Contact Guard/Touching assist     Transfers Chair/bed transfer  Transfers assist      Chair/bed transfer assist level: Minimal Assistance - Patient > 75%     Locomotion Ambulation   Ambulation assist   Ambulation activity did not occur: Safety/medical concerns (required therapeutic intervention to ambulate safely)  Assist level: Minimal Assistance - Patient > 75% Assistive device: Walker-rolling Max distance: 150'   Walk 10 feet activity   Assist  Walk 10 feet activity did not occur: Safety/medical concerns  Assist level: Minimal Assistance - Patient > 75% Assistive device: Walker-rolling   Walk 50 feet activity   Assist Walk 50 feet with 2 turns activity did not occur: Safety/medical concerns  Assist level: Minimal Assistance - Patient > 75% Assistive device: Walker-rolling    Walk 150 feet activity   Assist Walk 150 feet activity did not occur: Safety/medical concerns  Assist level: Minimal Assistance - Patient > 75% Assistive device: Walker-rolling    Walk 10 feet on uneven surface  activity   Assist Walk 10 feet on uneven surfaces activity did not occur: Safety/medical concerns         Wheelchair     Assist Will patient use wheelchair at discharge?:  (TBD but do not anticipate)             Wheelchair 50 feet with 2 turns activity    Assist            Wheelchair 150 feet activity     Assist          Blood pressure (!) 141/79, pulse 90, temperature 99 F (37.2 C), temperature source Oral, resp. rate 17, height 5\' 6"  (1.676 m), weight 61.3 kg, SpO2 93 %.  Medical Problem List and Plan: 1.  Impaired mobility and ADLs secondary to left kidney pyelonephritis             -patient may shower             -ELOS/Goals: minA 2-3 weeks  -may have two visitors 2.  Antithrombotics: -DVT/anticoagulation:  Pharmaceutical: Lovenox             -antiplatelet therapy: N/A 3. Headaches/Pain Management: tylenol prn 4. Mood/delirium: LCSW to follow for evaluation and support.              -antipsychotic agents:  N/A  12/4-pt on fairly large doses of seroquel currently which it doesn't appear she was on PTA--->reduce day time seroquel to 50mg  and night time dose to 100   12/5 pt was not placed on klonopin for addnl seizure prophylaxis   -will reduce to 0.5mg  TID today which is home dose   -continue same seroquel with plans to reduce these further this week  12/7 reduce seroquel to 75mg  qhs and 25mg  qam     -reduced klonopin to 0.5mg  bid 5. Neuropsych: This patient is not fully capable of making decisions on her own behalf. 6. Skin/Wound Care: Routine pressure relief measures.  7. Fluids/Electrolytes/Nutrition: Monitor I/O. Follow up labs Monday.  8. Seizure disorder: Continue Vimpat 200 mg bid and Depakene 500 mg bid.             --  sleep wake chart to monitor sleep pattern.             --ammonia level nl  -depakote level 51   9. Dehydration: Improving. Encourage fluid intake --offer between meals. 10. Leucocytosis: Resolved 6.3 K on 12/6 11. Anemia: Due to critical illness? H/H has been in 8-9 range. 8.6 on 12/3. Will order anemia panel 12. Tachycardia: Monitor HR tid--on metoprolol bid. Currently better controlled.  14. Stress induced hyperglycemia: Hgb A1C-5.7. CBGs 86-123 on 12/3 15. Constipation: On multiple laxatives including daily suppository.    12/5 -had large liquid bowel movement yesterday   12/8- now with abdominal pain. Moved bowels this morning   -anxiety component, also decreasing psych meds above which may be playing a part   -prn anti-emetics, simethicone prn for gas   -does lave low grade temp---check ua,ucx 16.  Cough, afeb with nl WBC, mild exp wheezes, rhonchi  -dulera, duoneb, robitussin dm  -IS, OOB LOS: 5 days A FACE TO FACE EVALUATION WAS PERFORMED  Meredith Staggers 05/10/2020, 8:47 AM

## 2020-05-10 NOTE — Progress Notes (Signed)
Physical Therapy Session Note  Patient Details  Name: Maria [name] MRN: [id number] Date of Birth: 01/23/1961  Today's Date: 05/10/2020 PT Individual Time: 1001-1058 PT Individual Time Calculation (min): 57 min   Short Term Goals: Week 1:  PT Short Term Goal 1 (Week 1): Pt will perform supine<>sit with min assist PT Short Term Goal 2 (Week 1): Pt will perform sit<>stands using LRAD with min assist PT Short Term Goal 3 (Week 1): Pt will perform bed<>chair transfers using LRAD with min assist PT Short Term Goal 4 (Week 1): Pt will ambulate at least 59ft using LRAD with min assist PT Short Term Goal 5 (Week 1): Pt will ascend/descend at least 1 step using LRAD with mod assist  Skilled Therapeutic Interventions/Progress Updates:     Pt received supine in bed and agrees to therapy. No complaint of pain. Supine to sit with supervision and cues for positioning at EOB for safety. Pt performs sit to stand with RW with CGA, begins ambulating and requests to void urin in restroom. MinA required for transfer to toilet with RW. Pt performs pericare with set-up assist. MinA for transfer to tilt in space WC. Pt transported to therapy gym for time management and energy conservation. PT attempts to have pt perform gait training but pt resists after several steps and requests to return to room due to fatigue. PT educates pt on benefits of participation and risks of prolonged bedrest. With firm and consistent encouragement pt eventually agreeable to attempting ambulation. Pt ambulates without AD and PT providing minA at hips initially, ambulating with wide BOS and very short stride lengths and increased postural sway. PT adjusts by providing HHA and pt demonstrates improved gait pattern, increasing gait speed and bilateral stride length. Pt ambulates total of 200'. WC transport back to room. Pt left semi recliner in tilt in space WC with alarm intact and all needs within reach.  Therapy Documentation Precautions:   Precautions Precautions: Fall Restrictions Weight Bearing Restrictions: No    Therapy/Group: Individual Therapy  Breck Coons, PT, DPT 05/10/2020, 3:54 PM

## 2020-05-10 NOTE — Progress Notes (Signed)
Occupational Therapy Session Note  Patient Details  Name: Maria [name] MRN: [id number] Date of Birth: 1961/04/19  Today's Date: 05/10/2020 OT Individual Time: 1400-1455 OT Individual Time Calculation (min): 55 min    Short Term Goals: Week 1:  OT Short Term Goal 1 (Week 1): Patient will maintain sitting balance at EOB with no more than mod A of 1 person within BADL task OT Short Term Goal 2 (Week 1): Pt will complete 1 step of toileting task OT Short Term Goal 3 (Week 1): Pt will complete sit<>stand with mod A of 1 perosn in preparation for BADL tasks   Skilled Therapeutic Interventions/Progress Updates:    Pt greeted at time of session sitting up in TIS wheelchair with mother present, pt initially wanting to go to bed at beginning of session and not participate in OT but later agreeable to ADL with encouragement from therapist and mother. No pain throughout session. Pt walked w/c <> bathroom hand held assist/Min A transferred to toilet in same manner with posterior lean noted and pt impulsive with ambulating/transfers. Also encouraged not to furniture walk. Pt with (+) void bladder, CGA for hygiene in sitting, doffed clothing with Min A and sponge bathe in bathroom on BSC with Min  overall. UB bathe CGA, LB bathe assist for buttocks but declined fully washing BLEs. UB dress CGA for pull over shirt and Min A for threading pants, pt able to don over hips in standing with Min A for standing balance. Walked back to chair Min/HHA to put alarm on while therapist retrieved briefs before walking w/c <> bathroom again to don brief in standing. Pt then insistent on going to bed as she had been up for approx 4 hours, sit to supine Supervision. Extensive conversation with mom and brother regarding her participation as they believe it is because she normally did not get up until 2pm at home and is not participating in the am, but pt was primarily sedentary at home. Pt with alarm on call bell in reach.    Therapy Documentation Precautions:  Precautions Precautions: Fall Restrictions Weight Bearing Restrictions: No     Therapy/Group: Individual Therapy  Viona Gilmore 05/10/2020, 3:05 PM

## 2020-05-10 NOTE — Progress Notes (Signed)
Pt still refusing all medications. Pt educated on the importance of taking theses medications. Pt continues to refuse. Pt laying in bed, coughing and c/o lower abd pain. No obvious signs of distress. Call light in reach

## 2020-05-10 NOTE — Progress Notes (Signed)
Verbal order given for KUB & CBC w/diff by Algis Liming, PA. Pt having increased lower abd pain and c/o nausea. Pt refusing to take all medications at this time.

## 2020-05-11 ENCOUNTER — Inpatient Hospital Stay (HOSPITAL_COMMUNITY): Payer: Medicare Other | Admitting: Occupational Therapy

## 2020-05-11 ENCOUNTER — Inpatient Hospital Stay (HOSPITAL_COMMUNITY): Payer: Medicare Other

## 2020-05-11 DIAGNOSIS — A499 Bacterial infection, unspecified: Secondary | ICD-10-CM

## 2020-05-11 DIAGNOSIS — N390 Urinary tract infection, site not specified: Secondary | ICD-10-CM

## 2020-05-11 LAB — CBC WITH DIFFERENTIAL/PLATELET
Abs Immature Granulocytes: 0.13 10*3/uL — ABNORMAL HIGH (ref 0.00–0.07)
Basophils Absolute: 0 10*3/uL (ref 0.0–0.1)
Basophils Relative: 0 %
Eosinophils Absolute: 0 10*3/uL (ref 0.0–0.5)
Eosinophils Relative: 0 %
HCT: 30.6 % — ABNORMAL LOW (ref 36.0–46.0)
Hemoglobin: 9.9 g/dL — ABNORMAL LOW (ref 12.0–15.0)
Immature Granulocytes: 1 %
Lymphocytes Relative: 4 %
Lymphs Abs: 0.8 10*3/uL (ref 0.7–4.0)
MCH: 30.4 pg (ref 26.0–34.0)
MCHC: 32.4 g/dL (ref 30.0–36.0)
MCV: 93.9 fL (ref 80.0–100.0)
Monocytes Absolute: 1.8 10*3/uL — ABNORMAL HIGH (ref 0.1–1.0)
Monocytes Relative: 10 %
Neutro Abs: 15.6 10*3/uL — ABNORMAL HIGH (ref 1.7–7.7)
Neutrophils Relative %: 85 %
Platelets: 300 10*3/uL (ref 150–400)
RBC: 3.26 MIL/uL — ABNORMAL LOW (ref 3.87–5.11)
RDW: 14.9 % (ref 11.5–15.5)
WBC: 18.4 10*3/uL — ABNORMAL HIGH (ref 4.0–10.5)
nRBC: 0 % (ref 0.0–0.2)

## 2020-05-11 LAB — LACTIC ACID, PLASMA
Lactic Acid, Venous: 4.9 mmol/L (ref 0.5–1.9)
Lactic Acid, Venous: 3.2 mmol/L (ref 0.5–1.9)

## 2020-05-11 LAB — BASIC METABOLIC PANEL
Anion gap: 17 — ABNORMAL HIGH (ref 5–15)
BUN: 17 mg/dL (ref 6–20)
CO2: 22 mmol/L (ref 22–32)
Calcium: 9.5 mg/dL (ref 8.9–10.3)
Chloride: 95 mmol/L — ABNORMAL LOW (ref 98–111)
Creatinine, Ser: 1.42 mg/dL — ABNORMAL HIGH (ref 0.44–1.00)
GFR, Estimated: 43 mL/min — ABNORMAL LOW (ref >60)
Glucose, Bld: 153 mg/dL — ABNORMAL HIGH (ref 70–99)
Potassium: 4 mmol/L (ref 3.5–5.1)
Sodium: 134 mmol/L — ABNORMAL LOW (ref 135–145)

## 2020-05-11 LAB — CBC
HCT: 29.3 % — ABNORMAL LOW (ref 36.0–46.0)
Hemoglobin: 8.9 g/dL — ABNORMAL LOW (ref 12.0–15.0)
MCH: 29.1 pg (ref 26.0–34.0)
MCHC: 30.4 g/dL (ref 30.0–36.0)
MCV: 95.8 fL (ref 80.0–100.0)
Platelets: 301 10*3/uL (ref 150–400)
RBC: 3.06 MIL/uL — ABNORMAL LOW (ref 3.87–5.11)
RDW: 15 % (ref 11.5–15.5)
WBC: 14.8 10*3/uL — ABNORMAL HIGH (ref 4.0–10.5)
nRBC: 0 % (ref 0.0–0.2)

## 2020-05-11 LAB — GLUCOSE, CAPILLARY
Glucose-Capillary: 119 mg/dL — ABNORMAL HIGH (ref 70–99)
Glucose-Capillary: 155 mg/dL — ABNORMAL HIGH (ref 70–99)
Glucose-Capillary: 113 mg/dL — ABNORMAL HIGH (ref 70–99)

## 2020-05-11 LAB — PROCALCITONIN: Procalcitonin: 0.9 ng/mL

## 2020-05-11 MED ORDER — SODIUM CHLORIDE 0.9 % IV SOLN
2.0000 g | INTRAVENOUS | Status: DC
  Administered 2020-05-11: 2 g via INTRAVENOUS
  Filled 2020-05-11 (×2): qty 20

## 2020-05-11 MED ORDER — CLONAZEPAM 0.5 MG PO TABS
0.5000 mg | ORAL_TABLET | Freq: Two times a day (BID) | ORAL | Status: DC
  Administered 2020-05-12: 0.5 mg via ORAL
  Filled 2020-05-11: qty 1

## 2020-05-11 MED ORDER — SODIUM CHLORIDE 0.9 % IV SOLN: 1.0000 g | Freq: Two times a day (BID) | INTRAVENOUS | Status: DC

## 2020-05-11 MED ORDER — SODIUM CHLORIDE 0.9 % IV BOLUS
2000.0000 mL | Freq: Once | INTRAVENOUS | Status: AC
  Administered 2020-05-11: 2000 mL via INTRAVENOUS

## 2020-05-11 MED ORDER — SODIUM CHLORIDE 0.9 % IV BOLUS
250.0000 mL | Freq: Once | INTRAVENOUS | Status: AC
  Administered 2020-05-11: 250 mL via INTRAVENOUS

## 2020-05-11 MED ORDER — SODIUM CHLORIDE 0.9 % IV BOLUS
500.0000 mL | Freq: Once | INTRAVENOUS | Status: AC
  Administered 2020-05-11: 500 mL via INTRAVENOUS

## 2020-05-11 MED ORDER — ACETAMINOPHEN 650 MG RE SUPP
650.0000 mg | RECTAL | Status: DC | PRN
  Administered 2020-05-11 – 2020-05-12 (×3): 650 mg via RECTAL
  Filled 2020-05-11 (×3): qty 1

## 2020-05-11 MED ORDER — CEPHALEXIN 250 MG PO CAPS
500.0000 mg | ORAL_CAPSULE | Freq: Two times a day (BID) | ORAL | Status: DC
  Administered 2020-05-11: 500 mg via ORAL
  Filled 2020-05-11: qty 2

## 2020-05-11 MED ORDER — SODIUM CHLORIDE 0.9 % IV SOLN: INTRAVENOUS | Status: DC

## 2020-05-11 NOTE — Progress Notes (Signed)
Physical Therapy Session Note  Patient Details  Name: Maria [name] MRN: [id number] Date of Birth: April 20, 1961  Today's Date: 05/11/2020 PT Individual Time: 2836-6294 PT Individual Time Calculation (min): 53 min   Short Term Goals: Week 1:  PT Short Term Goal 1 (Week 1): Pt will perform supine<>sit with min assist PT Short Term Goal 2 (Week 1): Pt will perform sit<>stands using LRAD with min assist PT Short Term Goal 3 (Week 1): Pt will perform bed<>chair transfers using LRAD with min assist PT Short Term Goal 4 (Week 1): Pt will ambulate at least 64ft using LRAD with min assist PT Short Term Goal 5 (Week 1): Pt will ascend/descend at least 1 step using LRAD with mod assist  Skilled Therapeutic Interventions/Progress Updates:     Pt received supine in bed attempting to get out. PT assists pt with supine to sit with modA. Seated at EOB, pt leans forward with eyes closed and is minimally responsive to PT questions and commands. Pt leaning to the R but able to utilize righting reactions with minA when COG moves outside BOS. PT questions whether pt is in pain or needs to use restroom and pt indicates no. Squat pivot to WC with maxA. WC transport to therapy gym for energy conservation. PT attempts to assist pt with ambulation, sit to stand requiring modA and pt takes 2-3 steps before attempting to sit back down, requiring maxA for safe return to chair. PT attempts to use bright lights and increased upright positioning to increase pt's arousal level. Squat pivot to mat table with maxA. Pt requires min/modA for static sitting at edge of mat, continuing to lean to the R. PT has pt recline into semi-fowler's position and utilize core muscles to return to upright sitting x5 reps. PT attempts to facilitate sit to stand transfer and pt resists. MaxA for squat pivot back to WC. Pt positioned for comfort in tilt in space WC with all needs within reach and alarm intact. RN notified of pt's decreased arousal and  participation.  Therapy Documentation Precautions:  Precautions Precautions: Fall Restrictions Weight Bearing Restrictions: No   Therapy/Group: Individual Therapy  Breck Coons, PT, DPT 05/11/2020, 3:48 PM

## 2020-05-11 NOTE — Progress Notes (Signed)
   05/11/20 1811  Assess: MEWS Score  Temp (!) 101.8 F (38.8 C)  BP (!) 127/58  Pulse Rate (!) 131  Resp (!) 32  SpO2 97 %  O2 Device Room Air  Assess: MEWS Score  MEWS Temp 2  MEWS Systolic 0  MEWS Pulse 3  MEWS RR 2  MEWS LOC 0  MEWS Score 7  MEWS Score Color Red  Assess: if the MEWS score is Yellow or Red  Were vital signs taken at a resting state? Yes (coughing occassionally)  Focused Assessment Change from prior assessment (see assessment flowsheet)  Early Detection of Sepsis Score *See Row Information* Medium  MEWS guidelines implemented *See Row Information* Yes  Treat  MEWS Interventions Administered scheduled meds/treatments  Pain Scale Faces  Pain Score 0  Complains of Other (Comment)  Neuro symptoms relieved by Music;Rest  Take Vital Signs  Increase Vital Sign Frequency  Red: Q 1hr X 4 then Q 4hr X 4, if remains red, continue Q 4hrs  Escalate  MEWS: Escalate Red: discuss with charge nurse/RN and provider, consider discussing with RRT  Notify: Charge Nurse/RN  Name of Charge Nurse/RN Notified [place]  Date Charge Nurse/RN Notified 05/11/20  Time Charge Nurse/RN Notified 1816  Notify: Provider  Provider Name/Title Pam LOve (Hospitalist MD aware and checked on pt)  Date Provider Notified 05/11/20  Time Provider Notified 1816  Notification Type Call  Notification Reason Change in status  Response See new orders  Date of Provider Response 05/11/20  Time of Provider Response 1817  Document  Patient Outcome  (IV fluids)  Progress note created (see row info) Yes

## 2020-05-11 NOTE — Progress Notes (Signed)
   05/11/20 1851  Assess: MEWS Score  Temp (!) 101.6 F (38.7 C)  BP 121/73  Pulse Rate (!) 123  Resp (!) 24  SpO2 97 %  O2 Device Room Air  Assess: MEWS Score  MEWS Temp 2  MEWS Systolic 0  MEWS Pulse 2  MEWS RR 1  MEWS LOC 0  MEWS Score 5  MEWS Score Color Red  Assess: if the MEWS score is Yellow or Red  Were vital signs taken at a resting state? Yes  Focused Assessment No change from prior assessment  Early Detection of Sepsis Score *See Row Information* High  MEWS guidelines implemented *See Row Information* Yes  Treat  MEWS Interventions Administered scheduled meds/treatments  Pain Scale Faces  Pain Score 0  Take Vital Signs  Increase Vital Sign Frequency  Red: Q 1hr X 4 then Q 4hr X 4, if remains red, continue Q 4hrs  Escalate  MEWS: Escalate Red: discuss with charge nurse/RN and provider, consider discussing with RRT  Notify: Charge Nurse/RN  Name of Charge Nurse/RN Notified mekides  Date Charge Nurse/RN Notified 05/11/20  Time Charge Nurse/RN Notified 1858  Notify: Provider  Provider Name/Title pam LOve  Date Provider Notified 05/11/20  Time Provider Notified 1858  Notification Type Face-to-face  Notification Reason Change in status  Response See new orders  Date of Provider Response 05/11/20  Time of Provider Response 1859  Document  Patient Outcome Other (Comment) (continue with current treatment)  Progress note created (see row info) Yes

## 2020-05-11 NOTE — H&P (Incomplete)
Patient continue to verbalize discomfort of abdominal pain "hurting",al over, patient agrees to take po ice chip and medications, assisted nurse Tiffany spoke with pharmacist for clarification on medication and time schedule

## 2020-05-11 NOTE — Progress Notes (Signed)
[place] PHYSICAL MEDICINE & REHABILITATION PROGRESS NOTE   Subjective/Complaints: Frequently refusing meds, activities. Still having abdominal pain. Now more lower abdomen.   ROS: Limited due to cognitive/behavioral    Objective:   DG Abd 1 View  Result Date: 05/10/2020 CLINICAL DATA:  Bilateral lower quadrant abdominal pain EXAM: ABDOMEN - 1 VIEW COMPARISON:  05/01/2020 FINDINGS: Lung bases are clear. Esophageal tube has been removed. Nonobstructed gas pattern. Probable phleboliths in the right pelvis. No radiopaque calculi over the kidneys. IMPRESSION: Nonobstructed gas pattern. Electronically Signed   By: Donavan Foil M.D.   On: 05/10/2020 22:14   Recent Labs    05/10/20 2205 05/11/20 0309  WBC 10.2 14.8*  HGB 9.8* 8.9*  HCT 30.4* 29.3*  PLT 337 301   No results for input(s): NA, K, CL, CO2, GLUCOSE, BUN, CREATININE, CALCIUM in the last 72 hours. No intake or output data in the 24 hours ending 05/11/20 1048   Pressure Injury 04/22/20 Back Medial;Right Stage 2 -  Partial thickness loss of dermis presenting as a shallow open injury with a red, pink wound bed without slough. Medical device pressure injury-shape of lopez valve (Active)  04/22/20 0800  Location: Back  Location Orientation: Medial;Right  Staging: Stage 2 -  Partial thickness loss of dermis presenting as a shallow open injury with a red, pink wound bed without slough.  Wound Description (Comments): Medical device pressure injury-shape of lopez valve  Present on Admission: No    Physical Exam: Vital Signs Blood pressure 123/67, pulse 91, temperature 97.8 F (36.6 C), resp. rate 18, height 5\' 6"  (1.676 m), weight 61.3 kg, SpO2 (!) 82 %.  Constitutional: No distress . Vital signs reviewed. HEENT: EOMI, oral membranes moist Neck: supple Cardiovascular: RRR without murmur. No JVD    Respiratory/Chest: CTA Bilaterally without wheezes or rales. Normal effort    GI/Abdomen: BS +, non-distended. TTP  throughout Ext: no clubbing, cyanosis, or edema Psych: flat, sometimes difficult to engage Skin: small back wound  Neuro: sl more alert. Follows basic commands. Limited insight and awareness. Oriented to person, place, reason. Cranial nerves 2-12 are intact. Sensory exam is normal. Reflexes are 1+ in all 4's.  . No tremors. Motor function is grossly 3-4/5.   Musculoskeletal: Left flank? tenderness to palpation     Assessment/Plan: 1. Functional deficits which require 3+ hours per day of interdisciplinary therapy in a comprehensive inpatient rehab setting.  Physiatrist is providing close team supervision and 24 hour management of active medical problems listed below.  Physiatrist and rehab team continue to assess barriers to discharge/monitor patient progress toward functional and medical goals  Care Tool:  Bathing    Body parts bathed by patient: Right arm,Left arm,Chest,Abdomen,Face,Front perineal area,Right upper leg,Left upper leg,Right lower leg,Left lower leg   Body parts bathed by helper: Right lower leg,Left lower leg,Buttocks (assist to reach feet, pt able to reach past knee level)     Bathing assist Assist Level: Minimal Assistance - Patient > 75%     Upper Body Dressing/Undressing Upper body dressing   What is the patient wearing?: Pull over shirt    Upper body assist Assist Level: Supervision/Verbal cueing    Lower Body Dressing/Undressing Lower body dressing      What is the patient wearing?: Pants,Incontinence brief     Lower body assist Assist for lower body dressing: Moderate Assistance - Patient 50 - 74%     Toileting Toileting    Toileting assist Assist for toileting: Contact Guard/Touching assist  Transfers Chair/bed transfer  Transfers assist     Chair/bed transfer assist level: Minimal Assistance - Patient > 75%     Locomotion Ambulation   Ambulation assist   Ambulation activity did not occur: Safety/medical concerns (required  therapeutic intervention to ambulate safely)  Assist level: Minimal Assistance - Patient > 75% Assistive device: Hand held assist Max distance: 200'   Walk 10 feet activity   Assist  Walk 10 feet activity did not occur: Safety/medical concerns  Assist level: Minimal Assistance - Patient > 75% Assistive device: Hand held assist   Walk 50 feet activity   Assist Walk 50 feet with 2 turns activity did not occur: Safety/medical concerns  Assist level: Minimal Assistance - Patient > 75% Assistive device: Hand held assist    Walk 150 feet activity   Assist Walk 150 feet activity did not occur: Safety/medical concerns  Assist level: Minimal Assistance - Patient > 75% Assistive device: Hand held assist    Walk 10 feet on uneven surface  activity   Assist Walk 10 feet on uneven surfaces activity did not occur: Safety/medical concerns         Wheelchair     Assist Will patient use wheelchair at discharge?:  (TBD but do not anticipate)             Wheelchair 50 feet with 2 turns activity    Assist            Wheelchair 150 feet activity     Assist          Blood pressure 123/67, pulse 91, temperature 97.8 F (36.6 C), resp. rate 18, height 5\' 6"  (1.676 m), weight 61.3 kg, SpO2 (!) 82 %.  Medical Problem List and Plan: 1.  Impaired mobility and ADLs secondary to left kidney pyelonephritis             -patient may shower             -ELOS/Goals: minA 2-3 weeks  -may have two visitors 2.  Antithrombotics: -DVT/anticoagulation:  Pharmaceutical: Lovenox             -antiplatelet therapy: N/A 3. Headaches/Pain Management: tylenol prn 4. Mood/delirium: LCSW to follow for evaluation and support.              -antipsychotic agents: N/A  12/4-pt on fairly large doses of seroquel currently which it doesn't appear she was on PTA--->reduce day time seroquel to 50mg  and night time dose to 100   12/5 pt was not placed on klonopin for addnl seizure  prophylaxis   -will reduce to 0.5mg  TID today which is home dose   -continue same seroquel with plans to reduce these further this week  12/7 reduce seroquel to 75mg  qhs and 25mg  qam     -reduced klonopin to 0.5mg  bid  12/9 pt more alert but becoming disagreeable at times 5. Neuropsych: This patient is not fully capable of making decisions on her own behalf. 6. Skin/Wound Care: Routine pressure relief measures.  7. Fluids/Electrolytes/Nutrition: Monitor I/O. Follow up labs Monday.  8. Seizure disorder: Continue Vimpat 200 mg bid and Depakene 500 mg bid.             -- sleep wake chart to monitor sleep pattern.             --ammonia level nl  -depakote level 51   9. Dehydration: Improving. Encourage fluid intake --offer between meals. 10. Leucocytosis: Resolved 6.3 K on 12/6 11. Anemia: Due to  critical illness? H/H has been in 8-9 range. 8.6 on 12/3. Will order anemia panel 12. Tachycardia: Monitor HR tid--on metoprolol bid. Currently better controlled.  14. Stress induced hyperglycemia: Hgb A1C-5.7. CBGs 86-123 on 12/3 15. Constipation: On multiple laxatives including daily suppository.    12/5 -had large liquid bowel movement yesterday   12/8-9- now with abdominal pain. Has been moving bowels   -prn anti-emetics, simethicone prn for gas   -KUB without obstruction 16.  Intermittent cough  -dulera, duoneb, robitussin dm  -IS, OOB 17. Abdominal pain, leukycotis of 14.8  -UA+, UCx pending  -KUB negative  -begin empiric keflex 500mg  bid   -add pyridium for bladder discomfort  LOS: 6 days A FACE TO FACE EVALUATION WAS PERFORMED  Meredith Staggers 05/11/2020, 10:48 AM

## 2020-05-11 NOTE — Progress Notes (Signed)
Patient reported to be lethargic this afternoon and has had issues with lethargy per evaluation of therapy notes. She has had poor sleep due to poor intake in past 24 hours and poor sleep last night. Evaluated patient around 4 pm when responding but still lethargic- tachycardic but afebrile. Received a call an hour later regarding patient's fever T-104 rectally with continued lethargy. Bolused patient with 250 cc NS and started on 100 cc/hr. Tylenol, BMET/Procalcitonin ordered. Contacted Dr. Jamse Arn on call for hospital for input due to concerns of sepsis. She recommended another bolus of 500 cc as well as addition of Rocephin 1 gram bid. She will evaluate patient for input and management for now unless she feels it is needed or if patient decompensates.

## 2020-05-11 NOTE — Progress Notes (Signed)
   05/11/20 1453  Assess: MEWS Score  Temp 98.7 F (37.1 C)  BP 133/70  Pulse Rate (!) 120  Resp 18  Level of Consciousness Alert (responds to pain and voice but sleeps most of the time)  SpO2 99 %  Assess: MEWS Score  MEWS Temp 0  MEWS Systolic 0  MEWS Pulse 2  MEWS RR 0  MEWS LOC 0  MEWS Score 2  MEWS Score Color Yellow  Assess: if the MEWS score is Yellow or Red  Were vital signs taken at a resting state? Yes  Focused Assessment Change from prior assessment (see assessment flowsheet)  Early Detection of Sepsis Score *See Row Information* Low  MEWS guidelines implemented *See Row Information* Yes  Treat  MEWS Interventions Other (Comment)  Pain Scale Faces  Faces Pain Scale 4  Pain Type Acute pain  Pain Location Abdomen  Pain Intervention(s) Medication (See eMAR)  Breathing 0  Negative Vocalization 0  Facial Expression 2  Body Language 0  Consolability 0  PAINAD Score 2  Facial Expression 0  Muscle Tension 0  Neuro symptoms relieved by Music;Rest  Take Vital Signs  Increase Vital Sign Frequency  Yellow: Q 2hr X 2 then Q 4hr X 2, if remains yellow, continue Q 4hrs  Escalate  MEWS: Escalate Yellow: discuss with charge nurse/RN and consider discussing with provider and RRT  Notify: Charge Nurse/RN  Name of Charge Nurse/RN Notified [place]  Date Charge Nurse/RN Notified 05/11/20  Time Charge Nurse/RN Notified 1501  Notify: Provider  Provider Name/Title Algis Liming  Date Provider Notified 05/11/20  Time Provider Notified 1501  Notification Type Call  Notification Reason Change in status  Response See new orders  Date of Provider Response 05/11/20  Time of Provider Response 1509  Notify: Rapid Response  Name of Rapid Response RN Notified n/a  Document  Progress note created (see row info) Yes

## 2020-05-11 NOTE — Discharge Summary (Signed)
Physician Discharge Summary  Patient ID: Maria [name] MRN: [id number] DOB/AGE: August 12, 1960 59 y.o.  Admit date: 05/05/2020 Discharge date: 05/12/2020  Discharge Diagnoses:  Principal Problem:   Sepsis secondary to UTI [facility]) Active Problems:   Pyelonephritis of left kidney    Leukocytosis   Fever   AKI (acute kidney injury) ([place])   Debility   ESBL (extended spectrum beta-lactamase) producing bacteria infection   Bacteremia    Discharged Condition: serious  Significant Diagnostic Studies: DG Abd 1 View  Result Date: 05/10/2020 CLINICAL DATA:  Bilateral lower quadrant abdominal pain EXAM: ABDOMEN - 1 VIEW COMPARISON:  05/01/2020 FINDINGS: Lung bases are clear. Esophageal tube has been removed. Nonobstructed gas pattern. Probable phleboliths in the right pelvis. No radiopaque calculi over the kidneys. IMPRESSION: Nonobstructed gas pattern. Electronically Signed   By: Donavan Foil M.D.   On: 05/10/2020 22:14   US RENAL  Result Date: 05/11/2020 CLINICAL DATA:  Acute kidney injury EXAM: RENAL / URINARY TRACT ULTRASOUND COMPLETE COMPARISON:  Ultrasound renal dated April 25, 2020 FINDINGS: Right Kidney: Renal measurements: 11 x 5.1 x 5.2 cm = volume: 153 mL. There is no hydronephrosis. The cortical echogenicity is slightly increased. Left Kidney: Renal measurements: 9.7 x 5.2 x 3.8 cm = volume: 101 mL. The cortical echogenicity is slightly increased. There is mild left-sided collecting system dilatation. The previously demonstrated hypoechoic lesion involving the left kidney is no longer well visualized. Bladder: Appears normal for degree of bladder distention. Other: None. IMPRESSION: 1. Echogenic kidneys bilaterally which can be seen in patients with medical renal disease. 2. Atrophic left kidney, similar to prior study. 3. Mild left-sided collecting system dilatation. 4. Previously demonstrated hypoechoic area arising from the lower pole the left kidney is not well appreciated on today's  study. Electronically Signed   By: Constance Holster M.D.   On: 05/11/2020 22:27   DG CHEST PORT 1 VIEW  Result Date: 05/12/2020 CLINICAL DATA:  Shortness of breath, cough. EXAM: PORTABLE CHEST 1 VIEW COMPARISON:  December 9, 21. FINDINGS: The heart size and mediastinal contours are within normal limits. Both lungs are clear. No visible pleural effusions or pneumothorax. Biapical pleuroparenchymal scarring. No acute osseous abnormality. IMPRESSION: No evidence of acute cardiopulmonary disease. Electronically Signed   By: Margaretha Sheffield MD   On: 05/12/2020 09:53   DG Chest Port 1 View  Result Date: 05/11/2020 CLINICAL DATA:  Acute kidney injury EXAM: PORTABLE CHEST 1 VIEW COMPARISON:  May 08, 2020 FINDINGS: The heart size is stable. The lung volumes are low. There are probable small bilateral pleural effusions with adjacent atelectasis, left worse than right. There is no pneumothorax, however evaluation of the right lung apex is limited secondary to overlapping osseous structures. There is no acute osseous abnormality. IMPRESSION: Low lung volumes with probable small bilateral pleural effusions, left greater than right. No definite focal infiltrate. Electronically Signed   By: Constance Holster M.D.   On: 05/11/2020 20:02    Labs:  Basic Metabolic Panel: BMP Latest Ref Rng & Units 05/12/2020 05/11/2020  Glucose 70 - 99 mg/dL 145(H) 153(H)  BUN 6 - 20 mg/dL 12 17  Creatinine 0.44 - 1.00 mg/dL 1.03(H) 1.42(H)  BUN/Creat Ratio 9 - 23 - -  Sodium 135 - 145 mmol/L 137 134(L)  Potassium 3.5 - 5.1 mmol/L 3.9 4.0  Chloride 98 - 111 mmol/L 106 95(L)  CO2 22 - 32 mmol/L 20(L) 22  Calcium 8.9 - 10.3 mg/dL 7.9(L) 9.5    CBC: Recent Labs  Lab 05/08/20  0725 05/10/20 2205 05/11/20 0309 05/11/20 1542  WBC 6.3 10.2 14.8* 18.4*  NEUTROABS 3.8 8.1*  --  15.6*  HGB 9.2* 9.8* 8.9* 9.9*  HCT 29.7* 30.4* 29.3* 30.6*  MCV 96.1 93.3 95.8 93.9  PLT 371 337 301 300    CBG: Recent Labs  Lab  05/10/20 0625 05/10/20 1139 05/10/20 1658 05/11/20 1133 05/11/20 1635  GLUCAP 115* 129* 114* 119* 155*    Brief HPI:   Maria [name] is a 59 y.o. female with history of seizure disorder, intellectual delay, esophageal stricture, tremors was originally admitted 04/14/2020 with fevers, anorexia x3 days and mental status changes due to septic shock secondary to E. coli pyelonephritis.  She was treated with IV antibiotics and had did have issues with hypotension requiring pressors.  Hospital course was significant for breakthrough seizures as well as delirium with agitation, bouts of lethargy hyperammonemia as well as dysphagia.  Neurology was consulted for input on seizures and she was loaded with IV Keppra.  Recommendations are to continue home dose valproic acid level.  As patient was felt to have baseline brief ictal-interictal discharges.  No recommendations to increase AEDs unless patient has clinical seizures.  She she developed significant hypoxia 11/30 with concerns of aspiration event.  Swallow function showed no signs of aspiration and dysphagia 3 diet recommended.  Therapy initiated and patient was started to have balance deficits with tachycardia as well as cognitive deficits affecting ADLs and mobility.  CIR was recommended due to functional decline.    Hospital Course: Maria [name] was admitted to rehab 05/05/2020 for inpatient therapies to consist of PT, ST and OT at least three hours five days a week. Past admission physiatrist, therapy team and rehab RN have worked together to provide customized collaborative inpatient rehab. At admission, patient required max to total assist for ADL tasks and max assist for mobility. She exhibited moderate cognitive linguistic impairments with minimal oropharyngeal dysphagia.  Follow-up labs showed evidence of dehydration and fluid intake was encouraged between meals.  She was maintained on her bowel program to help manage constipation.  Daytime  Seroquel was decreased to 50 mg to prevent daytime sedation.  Repeat ammonia levels was within normal limits.  Her blood pressures were monitored on TID basis and was reasonably controlled.  She was tolerating dysphagia 3 diet but continued to have issues with intermittent coughing episodes.  Follow-up CBC showed WBCs to be within normal limits.  She continues to have issues with intermittent sedation therefore Klonopin and Seroquel is being weaned down.  She started reporting abdominal discomfort with decrease in p.o. intake on 12/08.  KUB was negative for constipation.  CBC showed rise in WBC and UA done showed evidence of UTI therefore she was started on Keflex for treatment.  On p.m. of 12/09 she was noted to have increasing lethargy with fever up to 104.  She was pancultured and found to be septic with elevated lactate and was treated with normal saline boluses with improvement in mentation.  Dr. Maudry Diego was consulted to assist with management of patient and recommended Rocephin 2 g every 24 hours.  Abdominal ultrasound was also done due to AKI and showed no evidence of hydronephrosis.  Urine culture showed evidence of ESBL E. coli and blood culture was positive for gram negative rods. Dr. Juleen China with infectious disease was consulted for input and recommended meropenem as well as repeat CT to reassess for possible renal abscess.  Dr. Algis Liming was also consulted for transfer of patient  to acute service for management. Patient was discharged to acute hospital on 05/12/2020.    Scheduled Meds: . atorvastatin  20 mg Oral QPM  . bisacodyl  10 mg Rectal QPC supper  . chlorhexidine  15 mL Mouth Rinse BID  . clonazePAM  0.5 mg Oral BID  . diazepam  2 mg Oral QHS  . divalproex  500 mg Oral BID  . docusate sodium  100 mg Oral BID  . enoxaparin (LOVENOX) injection  40 mg Subcutaneous Q24H  . feeding supplement  237 mL Oral TID BM  . lacosamide  200 mg Oral BID  . mouth rinse  15 mL Mouth  Rinse q12n4p  . metoprolol tartrate  25 mg Oral BID  . mometasone-formoterol  2 puff Inhalation BID  . multivitamin with minerals  1 tablet Oral Daily  . pantoprazole  40 mg Oral Q supper  . QUEtiapine  25 mg Oral Daily  . QUEtiapine  75 mg Oral QHS   Continuous Infusions: . sodium chloride 100 mL/hr at 05/11/20 1625  . Meropenum   1 gram/100 ml    Diet: Dysphagia 3, thin liquids.   Disposition: Acute hospital.    Signed: Bary Leriche 05/13/2020, 7:09 PM

## 2020-05-11 NOTE — Progress Notes (Signed)
Occupational Therapy Session Note  Patient Details  Name: Maria [name] MRN: [id number] Date of Birth: 12-15-1960  Today's Date: 05/11/2020  Session 1 OT Individual Time: 6945-0388 OT Individual Time Calculation (min): 30 min  and Today's Date: 05/11/2020 OT Missed Time: 15 Minutes Missed Time Reason: Patient fatigue  Session 2 OT Individual Time: 8280-0349 OT Individual Time Calculation (min): 25 min  and Today's Date: 05/11/2020  Short Term Goals: Week 1:  OT Short Term Goal 1 (Week 1): Patient will maintain sitting balance at EOB with no more than mod A of 1 person within BADL task OT Short Term Goal 2 (Week 1): Pt will complete 1 step of toileting task OT Short Term Goal 3 (Week 1): Pt will complete sit<>stand with mod A of 1 perosn in preparation for BADL tasks  Skilled Therapeutic Interventions/Progress Updates:  Session 1   Patient greeted asleep in bed and difficult to arouse. OT made changes to environment by turning on room lights, muting TV to limit distractions, and provided verbal and tactile cues. Pt would not open her eyes for therapist. OT total A transferred pt to sitting EOB. Pt slightly more alert, but with posterior lean and needed max A to maintain sitting at EOB. OT tried to engage pt in LB dressing task to promote anterior weight shift, but pt too lethargic to participate. Max A to thread pants, then OT assisted pt with standing with max A. Pt was able to maintain standing and began assisting with pants briefly, but then returned to sitting before her pants were up. Pt continued to keep her eyes closed and would not participate. OT returned pt to bed and bed placed in chair position. OT handed pt wash cloth to try to get her to wash her face, but she was too lethargic to complete task. Pt left in bed in chair position with bed alarm on and needs met.   Session 2 Pt greeted in [place] with eyes closed, and would not respond to OT. OT tilted wc forward and she was able to  wake enough to assist with transfer back to bed with min A. Pt noted to bed incontinent of bowel and bladder. Total A for peri-care, rolling, and brief change 2/2 lethargy. Pt left semi-reclined in bed with bed alarm on and needs met.   Therapy Documentation Precautions:  Precautions Precautions: Fall Restrictions Weight Bearing Restrictions: No General:   Pain: Pt did not state pain  Therapy/Group: Individual Therapy  Valma Cava 05/11/2020, 3:37 PM

## 2020-05-11 NOTE — Progress Notes (Signed)
   05/11/20 1712  Vitals  BP (!) 167/75  MAP (mmHg) 101  BP Location Right Arm  BP Method Automatic  Patient Position (if appropriate) Lying  Pulse Rate (!) 133  Resp (!) 30  MEWS COLOR  MEWS Score Color Red  Oxygen Therapy  SpO2 98 %  O2 Device Room Air  Pain Assessment  Pain Scale Faces  Faces Pain Scale 0  PAINAD (Pain Assessment in Advanced Dementia)  Breathing 0  Negative Vocalization 0  Facial Expression 0  Body Language 0  Consolability 0  PAINAD Score 0  Complaints & Interventions  Neuro symptoms relieved by Music;Rest  MEWS Score  MEWS Temp 0  MEWS Systolic 0  MEWS Pulse 3  MEWS RR 2  MEWS LOC 0  MEWS Score 5  Provider Notification  Provider Name/Title Algis Liming  Date Provider Notified 05/11/20  Time Provider Notified 1717  Notification Type Call  Notification Reason Change in status  Response See new orders  Date of Provider Response 05/04/20  Time of Provider Response 1717  Rapid Response Notification  Name of Rapid Response RN Notified hella RN  Date Rapid Response Notified 05/11/20  Time Rapid Response Notified 1743

## 2020-05-11 NOTE — Progress Notes (Signed)
Patient has been very sleepy today patient will follow and respond to command; able to give some meds one at a time this morning but refused to eat breakfast and lunch. RN was able to give antibiotics mixed with ensure placed in med cup. Offered ensure  using cup in small amounts. Pam PA  notified and new orders noted.

## 2020-05-11 NOTE — Progress Notes (Signed)
Pt left unit for ultrasound

## 2020-05-11 NOTE — Progress Notes (Signed)
Patient is more alert ; able to express that she wants to drink;  Water offered by NT and able to finish 2 cups of water. Pam aware . Continued to monitor.

## 2020-05-11 NOTE — Consult Note (Signed)
Hospitalist Service Medical Consultation   Maria [name]  PIR:[id number]  DOB: 1960-10-30  DOA: 05/05/2020  PCP: Celene Squibb, MD   Requesting physician: NP Olin Hauser love  Reason for consultation: Fever and abnormal UA   History of Present Illness: Maria [name] is an 59 y.o. female with intellectual disability, seizure disorder, HFpEF, GERD and chronic constipation who was admitted 04/14/2020 with worsening abdominal pain.  Patient was diagnosed with septic shock secondary to urinary tract infection.  CT abdomen pelvis were suspicious for left pyelonephritis with a small left renal abscess.  She was admitted to the ICU requiring vasopressors.  Her ICU course was complicated by breakthrough seizures and ICU delirium requiring a Precedex drip.  Urine cultures were unrevealing but patient had responded well to a full course of ceftriaxone.  Patient was seen by urology who did not feel the abscess needed any further intervention.   Patient was weaned off pressors, Precedex drip and completed a course of ceftriaxone with marked improvement in her clinical symptoms.  However patient continued to be somewhat delirious and was admitted to rehab.  Patient was doing well until yesterday when she was noted to be febrile.  A UA was sent which was positive for >100K E. coli.  She was started on Keflex.  This morning however patient continued to be febrile and tachycardic, and [facility] hospitalist were called for consultation.  Patient herself is unable to give me any history.  She just repeats what I ask her.  RN at bedside states that patient is at baseline in terms of her mental status.  When I questioned RN at bedside about the cough, she states the patient has had a nonproductive cough for 4 to 5 days now.  Patient however has no oxygen requirement and does not appear to have any labored breathing per their assessment.  Review of Systems:  Patient unable to provide ROS due to cognitive  delay and delirium.   Past Medical History: Past Medical History:  Diagnosis Date  . Constipation   . Dysphagia   . Fracture    R foot  . GERD (gastroesophageal reflux disease)   . History of shingles 10/2016  . Mental retardation    lesion in head  . Seizures ([place])    "not fully controlled on max doses of meds" (Neuro ofc note 12/2014)  . Thrombocytopenia ([place])   . Tremor     Past Surgical History: Past Surgical History:  Procedure Laterality Date  . BIOPSY  09/16/2014   Procedure: BIOPSY;  Surgeon: Rogene Houston, MD;  Location: [facility];  Service: Endoscopy;;  . CATARACT EXTRACTION     both eyes, May of 2015  . COLONOSCOPY    . COLONOSCOPY WITH PROPOFOL N/A 11/20/2018   Procedure: COLONOSCOPY WITH PROPOFOL;  Surgeon: Rogene Houston, MD;  Location: [facility];  Service: Endoscopy;  Laterality: N/A;  . ESOPHAGEAL DILATION N/A 09/16/2014   Procedure: ESOPHAGEAL DILATION WITH 54FR MALONEY DILATOR;  Surgeon: Rogene Houston, MD;  Location: [facility];  Service: Endoscopy;  Laterality: N/A;  . ESOPHAGOGASTRODUODENOSCOPY (EGD) WITH PROPOFOL N/A 09/16/2014   Procedure: ESOPHAGOGASTRODUODENOSCOPY (EGD) WITH PROPOFOL;  Surgeon: Rogene Houston, MD;  Location: [facility];  Service: Endoscopy;  Laterality: N/A;  . MOUTH SURGERY    . POLYPECTOMY  11/20/2018   Procedure: POLYPECTOMY;  Surgeon: Rogene Houston, MD;  Location: [facility];  Service: Endoscopy;;  colon  . Skin graft to gum Right 08/2013  . TONSILLECTOMY AND ADENOIDECTOMY    . TOTAL ABDOMINAL HYSTERECTOMY       Allergies:   Allergies  Allergen Reactions  . Codeine Nausea And Vomiting  . Lamictal [Lamotrigine] Rash     Social History:  reports that she quit smoking about 13 years ago. Her smoking use included cigarettes. She has a 22.50 pack-year smoking history. She has never used smokeless tobacco. She reports that she does not drink alcohol and does not use drugs.   Family History: Family History  Problem  Relation Age of Onset  . High Cholesterol Mother   . High blood pressure Mother   . Diabetes Father      Physical Exam: Vitals:   05/10/20 2240 05/11/20 0413 05/11/20 1453 05/11/20 1712  BP:  123/67 133/70 (!) 167/75  Pulse:  91 (!) 120 (!) 133  Resp:  18 18 (!) 30  Temp:  97.8 F (36.6 C) 98.7 F (37.1 C)   TempSrc:   Oral   SpO2: 98% (!) 82% 99% 98%  Weight:      Height:        Constitutional: Hectic appearing female lying flat in bed appearing acutely ill.  She is noted to have a frequent nonproductive cough CVS: S1-S2 clear, no murmur rubs or gallops, no LE edema, normal pedal pulses  Respiratory: No increased work of breathing, no real tachypnea, patient is comfortable appearing.  Lungs are clear although she does have some rales at bases. GI: NABS, soft, NT, ND, no suprapubic tenderness noted.  No right upper quadrant tenderness.  No Murphy's. LE: No edema. No cyanosis   Data reviewed:  I have personally reviewed following labs and imaging studies Labs:  CBC: Recent Labs  Lab 05/08/20 0725 05/10/20 2205 05/11/20 0309 05/11/20 1542  WBC 6.3 10.2 14.8* 18.4*  NEUTROABS 3.8 8.1*  --  15.6*  HGB 9.2* 9.8* 8.9* 9.9*  HCT 29.7* 30.4* 29.3* 30.6*  MCV 96.1 93.3 95.8 93.9  PLT 371 337 301 211    Basic Metabolic Panel: Recent Labs  Lab 05/05/20 0419 05/06/20 0450 05/06/20 0450 05/08/20 0725 05/11/20 1542  NA  --  138  --  138 134*  K  --  3.8   < > 3.8 4.0  CL  --  96*  --  99 95*  CO2  --  28  --  26 22  GLUCOSE  --  117*  --  100* 153*  BUN  --  21*  --  16 17  CREATININE 0.98 1.06*  --  1.12* 1.42*  CALCIUM  --  9.9  --  9.7 9.5   < > = values in this interval not displayed.   GFR Estimated Creatinine Clearance: 40.4 mL/min (A) (by C-G formula based on SCr of 1.42 mg/dL (H)). Liver Function Tests: Recent Labs  Lab 05/08/20 0725  AST 29  ALT 42  ALKPHOS 111  BILITOT 0.6  PROT 7.2  ALBUMIN 2.8*   No results for input(s): LIPASE, AMYLASE in  the last 168 hours. Recent Labs  Lab 05/06/20 0450  AMMONIA 16   Coagulation profile No results for input(s): INR, PROTIME in the last 168 hours.  Cardiac Enzymes: No results for input(s): CKTOTAL, CKMB, CKMBINDEX, TROPONINI in the last 168 hours. BNP: Invalid input(s): POCBNP CBG: Recent Labs  Lab 05/10/20 0625 05/10/20 1139 05/10/20 1658 05/11/20 1133 05/11/20 1635  GLUCAP 115* 129* 114* 119* 155*   D-Dimer  No results for input(s): DDIMER in the last 72 hours. Hgb A1c No results for input(s): HGBA1C in the last 72 hours. Lipid Profile No results for input(s): CHOL, HDL, LDLCALC, TRIG, CHOLHDL, LDLDIRECT in the last 72 hours. Thyroid function studies No results for input(s): TSH, T4TOTAL, T3FREE, THYROIDAB in the last 72 hours.  Invalid input(s): FREET3 Anemia work up No results for input(s): VITAMINB12, FOLATE, FERRITIN, TIBC, IRON, RETICCTPCT in the last 72 hours. Urinalysis    Component Value Date/Time   COLORURINE YELLOW 05/10/2020 1739   APPEARANCEUR CLOUDY (A) 05/10/2020 1739   LABSPEC 1.011 05/10/2020 1739   PHURINE 6.0 05/10/2020 1739   GLUCOSEU NEGATIVE 05/10/2020 1739   HGBUR MODERATE (A) 05/10/2020 1739   BILIRUBINUR NEGATIVE 05/10/2020 1739   KETONESUR NEGATIVE 05/10/2020 1739   PROTEINUR 100 (A) 05/10/2020 1739   UROBILINOGEN 0.2 10/26/2014 2340   NITRITE POSITIVE (A) 05/10/2020 1739   LEUKOCYTESUR LARGE (A) 05/10/2020 1739     Sepsis Labs Invalid input(s): PROCALCITONIN,  WBC,  LACTICIDVEN Microbiology Recent Results (from the past 240 hour(s))  Culture, Urine     Status: Abnormal (Preliminary result)   Collection Time: 05/10/20  5:39 PM   Specimen: Urine, Clean Catch  Result Value Ref Range Status   Specimen Description URINE, CLEAN CATCH  Final   Special Requests NONE  Final   Culture (A)  Final    >=100,000 COLONIES/mL ESCHERICHIA COLI SUSCEPTIBILITIES TO FOLLOW Performed at [facility], [place] [address], [place], [place]  [address]    Report Status PENDING  Incomplete       Inpatient Medications:   Scheduled Meds: . atorvastatin  20 mg Oral QPM  . bisacodyl  10 mg Rectal QPC supper  . chlorhexidine  15 mL Mouth Rinse BID  . clonazePAM  0.5 mg Oral BID  . diazepam  2 mg Oral QHS  . divalproex  500 mg Oral BID  . docusate sodium  100 mg Oral BID  . enoxaparin (LOVENOX) injection  40 mg Subcutaneous Q24H  . feeding supplement  237 mL Oral TID BM  . lacosamide  200 mg Oral BID  . mouth rinse  15 mL Mouth Rinse q12n4p  . metoprolol tartrate  25 mg Oral BID  . mometasone-formoterol  2 puff Inhalation BID  . multivitamin with minerals  1 tablet Oral Daily  . pantoprazole  40 mg Oral Q supper  . QUEtiapine  25 mg Oral Daily  . QUEtiapine  75 mg Oral QHS   Continuous Infusions: . sodium chloride 100 mL/hr at 05/11/20 1625  . cefTRIAXone (ROCEPHIN)  IV       Radiological Exams on Admission: DG Abd 1 View  Result Date: 05/10/2020 CLINICAL DATA:  Bilateral lower quadrant abdominal pain EXAM: ABDOMEN - 1 VIEW COMPARISON:  05/01/2020 FINDINGS: Lung bases are clear. Esophageal tube has been removed. Nonobstructed gas pattern. Probable phleboliths in the right pelvis. No radiopaque calculi over the kidneys. IMPRESSION: Nonobstructed gas pattern. Electronically Signed   By: Donavan Foil M.D.   On: 05/10/2020 22:14    Impression/Recommendations Principal Problem:   Debility  59 year old female who has had a prolonged course after treatment for septic shock due to E. coli UTI and left pyelonephritis was doing well until yesterday when she developed another fever, leukocytosis and was noted to have a dirty urine.  That urine is now growing out greater than 100 K E. coli.  Patient was started on Keflex yesterday however continues to be febrile and tachycardic.  Sepsis secondary to recurrent UTI Will provide aggressive fluid resuscitation with 2 L normal saline bolus Will restart ceftriaxone, discontinue  Keflex Blood cultures have been sent, urine cultures have been sent.  Fever Most likely secondary to UTI however patient does have a cough even though she has no oxygen requirement and no apparent increased work of breathing.  Patient is unable to provide a history in terms of shortness of breath. Will get chest x-ray today. Antibiotics will need to be broadened if in fact she does have a pneumonia on chest x-ray although I suspect source of sepsis is in her urine.  Acute kidney injury Most likely secondary to infection and decreased intravascular volume due to increased insensible losses 3 L NS bolus ordered Increase fluids to NS 150 cc an hour Renal ultrasound ordered given history of left kidney abscess versus cyst.  This can be discussed with Dr. Junious Silk who stated this is an old cyst seen on previous CT in 2014 with no changes so this is most likely just a cyst. Recheck in the morning  Seizure disorder Continue present medications It is notable that patient has had breakthrough seizures when she was septic previously As needed Ativan as needed  HFpEF No evidence for acute decompensation Last EF is 60 to 65% with grade 2 diastolic dysfunction Continue metoprolol 12.5 twice daily IV fluid resuscitation as noted above.   Thank you for this consultation.  Our [facility] hospitalist team will follow the patient with you.     Vashti Hey M.D. [facility] 05/11/2020, 5:59 PM Please page [email or web address] for questions

## 2020-05-11 NOTE — Progress Notes (Signed)
Speech Language Pathology Daily Session Note  Patient Details  Name: Maria [name] MRN: [id number] Date of Birth: 10-21-1960  Today's Date: 05/11/2020 SLP Individual Time: 1100-1115 SLP Individual Time Calculation (min): 15 min  SLP Missed Time: 65 Minutes Missed Time Reason: Patient fatigue  Short Term Goals: Week 1: SLP Short Term Goal 1 (Week 1): Pt will tolerate trials of dysphagia 4 consistency and thin liquids with no overt s/sx of aspiration or penetration on 9/10 trials. SLP Short Term Goal 2 (Week 1): Pt will demonstrate adequate safety awareness for hospital situation with 85% accuracy min a verbal cues. SLP Short Term Goal 3 (Week 1): Pt will demonstrate orientation x4 using external aids with min A verbal cues. SLP Short Term Goal 4 (Week 1): Pt will sustain attention to functional tasks for 5-6 minutes with min A verbal cues.  Skilled Therapeutic Interventions: Pt greeted asleep in bed but rouses to voice. SLP attempting to change environment to promote engagement and increase alertness (lights on, verbal and tactile stim). Pt minimally opening eyes to interventions. Focus of tx on cognitive goals, increasing sustained attention to increase meaningful and functional interactions this date. Pt requires total A for sustained attention during this treatment session 2' fatigue. Pt nodding/shaking head to simple yes/no questions related to wants/needs on 2/20 trials. Pt too lethargic to continue skilled tx at this time despite max cues. Pt left in bed with alarm on and all needs met. Cont ST POC.   Pain Pain Assessment Pain Scale: Faces Faces Pain Scale: Hurts a little bit  Therapy/Group: Individual Therapy  Dewaine Conger 05/11/2020, 11:59 AM

## 2020-05-11 NOTE — Progress Notes (Signed)
CRITICAL VALUE ALERT  Critical Value 4.9 Lactic acid  Date & Time Notied: 05/11/2020. 1845  Provider Notified: Algis Liming, PA  Orders Received/Actions taken: contniued monitoring.    Gerald Stabs, RN

## 2020-05-12 ENCOUNTER — Inpatient Hospital Stay (HOSPITAL_COMMUNITY): Payer: Medicare Other

## 2020-05-12 ENCOUNTER — Inpatient Hospital Stay (HOSPITAL_COMMUNITY)
Admission: AD | Admit: 2020-05-12 | Discharge: 2020-05-18 | DRG: 871 | Disposition: A | Discharge status: Rehabilitation Facility | Payer: Medicare Other | Source: Other Acute Inpatient Hospital | Attending: Internal Medicine | Admitting: Internal Medicine

## 2020-05-12 ENCOUNTER — Inpatient Hospital Stay (HOSPITAL_COMMUNITY): Payer: Medicare Other | Admitting: Speech Pathology

## 2020-05-12 ENCOUNTER — Inpatient Hospital Stay (HOSPITAL_COMMUNITY): Payer: Medicare Other | Admitting: Occupational Therapy

## 2020-05-12 DIAGNOSIS — R400 Somnolence: Secondary | ICD-10-CM | POA: Diagnosis not present

## 2020-05-12 DIAGNOSIS — A4151 Sepsis due to Escherichia coli [E. coli]: Secondary | ICD-10-CM | POA: Diagnosis present

## 2020-05-12 DIAGNOSIS — J9811 Atelectasis: Secondary | ICD-10-CM | POA: Diagnosis present

## 2020-05-12 DIAGNOSIS — Z83438 Family history of other disorder of lipoprotein metabolism and other lipidemia: Secondary | ICD-10-CM

## 2020-05-12 DIAGNOSIS — N179 Acute kidney failure, unspecified: Secondary | ICD-10-CM

## 2020-05-12 DIAGNOSIS — R6520 Severe sepsis without septic shock: Secondary | ICD-10-CM | POA: Diagnosis not present

## 2020-05-12 DIAGNOSIS — R5381 Other malaise: Secondary | ICD-10-CM | POA: Diagnosis present

## 2020-05-12 DIAGNOSIS — Z1612 Extended spectrum beta lactamase (ESBL) resistance: Secondary | ICD-10-CM | POA: Diagnosis present

## 2020-05-12 DIAGNOSIS — K5909 Other constipation: Secondary | ICD-10-CM | POA: Diagnosis present

## 2020-05-12 DIAGNOSIS — Z87891 Personal history of nicotine dependence: Secondary | ICD-10-CM

## 2020-05-12 DIAGNOSIS — N151 Renal and perinephric abscess: Secondary | ICD-10-CM | POA: Diagnosis present

## 2020-05-12 DIAGNOSIS — Z90710 Acquired absence of both cervix and uterus: Secondary | ICD-10-CM | POA: Diagnosis not present

## 2020-05-12 DIAGNOSIS — F419 Anxiety disorder, unspecified: Secondary | ICD-10-CM | POA: Diagnosis present

## 2020-05-12 DIAGNOSIS — D72829 Elevated white blood cell count, unspecified: Secondary | ICD-10-CM | POA: Diagnosis not present

## 2020-05-12 DIAGNOSIS — Z20822 Contact with and (suspected) exposure to covid-19: Secondary | ICD-10-CM | POA: Diagnosis present

## 2020-05-12 DIAGNOSIS — A499 Bacterial infection, unspecified: Secondary | ICD-10-CM | POA: Diagnosis present

## 2020-05-12 DIAGNOSIS — B9620 Unspecified Escherichia coli [E. coli] as the cause of diseases classified elsewhere: Secondary | ICD-10-CM | POA: Diagnosis present

## 2020-05-12 DIAGNOSIS — R7881 Bacteremia: Secondary | ICD-10-CM

## 2020-05-12 DIAGNOSIS — A419 Sepsis, unspecified organism: Secondary | ICD-10-CM | POA: Diagnosis present

## 2020-05-12 DIAGNOSIS — I708 Atherosclerosis of other arteries: Secondary | ICD-10-CM | POA: Diagnosis present

## 2020-05-12 DIAGNOSIS — L89102 Pressure ulcer of unspecified part of back, stage 2: Secondary | ICD-10-CM | POA: Diagnosis present

## 2020-05-12 DIAGNOSIS — R569 Unspecified convulsions: Secondary | ICD-10-CM | POA: Diagnosis not present

## 2020-05-12 DIAGNOSIS — D638 Anemia in other chronic diseases classified elsewhere: Secondary | ICD-10-CM | POA: Diagnosis present

## 2020-05-12 DIAGNOSIS — E876 Hypokalemia: Secondary | ICD-10-CM | POA: Diagnosis present

## 2020-05-12 DIAGNOSIS — N2889 Other specified disorders of kidney and ureter: Secondary | ICD-10-CM | POA: Diagnosis not present

## 2020-05-12 DIAGNOSIS — Z888 Allergy status to other drugs, medicaments and biological substances status: Secondary | ICD-10-CM | POA: Diagnosis not present

## 2020-05-12 DIAGNOSIS — L0291 Cutaneous abscess, unspecified: Secondary | ICD-10-CM

## 2020-05-12 DIAGNOSIS — R509 Fever, unspecified: Secondary | ICD-10-CM

## 2020-05-12 DIAGNOSIS — Z833 Family history of diabetes mellitus: Secondary | ICD-10-CM | POA: Diagnosis not present

## 2020-05-12 DIAGNOSIS — R059 Cough, unspecified: Secondary | ICD-10-CM | POA: Diagnosis not present

## 2020-05-12 DIAGNOSIS — F79 Unspecified intellectual disabilities: Secondary | ICD-10-CM

## 2020-05-12 DIAGNOSIS — Z8249 Family history of ischemic heart disease and other diseases of the circulatory system: Secondary | ICD-10-CM | POA: Diagnosis not present

## 2020-05-12 DIAGNOSIS — N10 Acute tubulo-interstitial nephritis: Secondary | ICD-10-CM | POA: Diagnosis present

## 2020-05-12 DIAGNOSIS — Z8619 Personal history of other infectious and parasitic diseases: Secondary | ICD-10-CM

## 2020-05-12 DIAGNOSIS — R110 Nausea: Secondary | ICD-10-CM | POA: Diagnosis not present

## 2020-05-12 DIAGNOSIS — Z885 Allergy status to narcotic agent status: Secondary | ICD-10-CM | POA: Diagnosis not present

## 2020-05-12 DIAGNOSIS — G928 Other toxic encephalopathy: Secondary | ICD-10-CM | POA: Diagnosis present

## 2020-05-12 DIAGNOSIS — I700 Atherosclerosis of aorta: Secondary | ICD-10-CM | POA: Diagnosis not present

## 2020-05-12 DIAGNOSIS — E860 Dehydration: Secondary | ICD-10-CM | POA: Diagnosis present

## 2020-05-12 DIAGNOSIS — N12 Tubulo-interstitial nephritis, not specified as acute or chronic: Secondary | ICD-10-CM | POA: Diagnosis present

## 2020-05-12 DIAGNOSIS — K219 Gastro-esophageal reflux disease without esophagitis: Secondary | ICD-10-CM | POA: Diagnosis present

## 2020-05-12 DIAGNOSIS — I959 Hypotension, unspecified: Secondary | ICD-10-CM | POA: Diagnosis present

## 2020-05-12 DIAGNOSIS — R1310 Dysphagia, unspecified: Secondary | ICD-10-CM | POA: Diagnosis present

## 2020-05-12 DIAGNOSIS — R140 Abdominal distension (gaseous): Secondary | ICD-10-CM | POA: Diagnosis not present

## 2020-05-12 DIAGNOSIS — I5032 Chronic diastolic (congestive) heart failure: Secondary | ICD-10-CM | POA: Diagnosis present

## 2020-05-12 DIAGNOSIS — D649 Anemia, unspecified: Secondary | ICD-10-CM | POA: Diagnosis present

## 2020-05-12 DIAGNOSIS — G40909 Epilepsy, unspecified, not intractable, without status epilepticus: Secondary | ICD-10-CM

## 2020-05-12 DIAGNOSIS — R0602 Shortness of breath: Secondary | ICD-10-CM | POA: Diagnosis not present

## 2020-05-12 DIAGNOSIS — Z79899 Other long term (current) drug therapy: Secondary | ICD-10-CM | POA: Diagnosis not present

## 2020-05-12 DIAGNOSIS — R7989 Other specified abnormal findings of blood chemistry: Secondary | ICD-10-CM | POA: Diagnosis not present

## 2020-05-12 DIAGNOSIS — N390 Urinary tract infection, site not specified: Secondary | ICD-10-CM | POA: Diagnosis not present

## 2020-05-12 HISTORY — DX: Bacterial infection, unspecified: A49.9

## 2020-05-12 HISTORY — DX: Bacteremia: R78.81

## 2020-05-12 LAB — BLOOD CULTURE ID PANEL (REFLEXED) - BCID2

## 2020-05-12 LAB — COMPREHENSIVE METABOLIC PANEL
ALT: 19 U/L (ref 0–44)
AST: 17 U/L (ref 15–41)
Albumin: 2.2 g/dL — ABNORMAL LOW (ref 3.5–5.0)
Alkaline Phosphatase: 76 U/L (ref 38–126)
Anion gap: 11 (ref 5–15)
BUN: 12 mg/dL (ref 6–20)
CO2: 20 mmol/L — ABNORMAL LOW (ref 22–32)
Calcium: 7.9 mg/dL — ABNORMAL LOW (ref 8.9–10.3)
Chloride: 106 mmol/L (ref 98–111)
Creatinine, Ser: 1.03 mg/dL — ABNORMAL HIGH (ref 0.44–1.00)
GFR, Estimated: >60 mL/min (ref >60)
Glucose, Bld: 145 mg/dL — ABNORMAL HIGH (ref 70–99)
Potassium: 3.9 mmol/L (ref 3.5–5.1)
Sodium: 137 mmol/L (ref 135–145)
Total Bilirubin: 0.4 mg/dL (ref 0.3–1.2)
Total Protein: 5.6 g/dL — ABNORMAL LOW (ref 6.5–8.1)

## 2020-05-12 LAB — URINE CULTURE: Culture: >=100000 — AB

## 2020-05-12 LAB — GLUCOSE, CAPILLARY
Glucose-Capillary: 120 mg/dL — ABNORMAL HIGH (ref 70–99)
Glucose-Capillary: 111 mg/dL — ABNORMAL HIGH (ref 70–99)
Glucose-Capillary: 94 mg/dL (ref 70–99)

## 2020-05-12 LAB — CBC
HCT: 25.8 % — ABNORMAL LOW (ref 36.0–46.0)
Hemoglobin: 8.3 g/dL — ABNORMAL LOW (ref 12.0–15.0)
MCH: 30.3 pg (ref 26.0–34.0)
MCHC: 32.2 g/dL (ref 30.0–36.0)
MCV: 94.2 fL (ref 80.0–100.0)
Platelets: 233 10*3/uL (ref 150–400)
RBC: 2.74 MIL/uL — ABNORMAL LOW (ref 3.87–5.11)
RDW: 15.2 % (ref 11.5–15.5)
WBC: 15.5 10*3/uL — ABNORMAL HIGH (ref 4.0–10.5)
nRBC: 0 % (ref 0.0–0.2)

## 2020-05-12 LAB — RESP PANEL BY RT-PCR (RSV, FLU A&B, COVID)  RVPGX2
Influenza A by PCR: NEGATIVE
Influenza B by PCR: NEGATIVE
Resp Syncytial Virus by PCR: NEGATIVE
SARS Coronavirus 2 by RT PCR: NEGATIVE

## 2020-05-12 LAB — LACTIC ACID, PLASMA
Lactic Acid, Venous: 2.2 mmol/L (ref 0.5–1.9)
Lactic Acid, Venous: 1.8 mmol/L (ref 0.5–1.9)
Lactic Acid, Venous: 1.7 mmol/L (ref 0.5–1.9)

## 2020-05-12 LAB — PROCALCITONIN: Procalcitonin: 2.47 ng/mL

## 2020-05-12 MED ORDER — CHLORHEXIDINE GLUCONATE 0.12 % MT SOLN
15.0000 mL | Freq: Two times a day (BID) | OROMUCOSAL | Status: DC
  Administered 2020-05-14: 23:00:00 15 mL via OROMUCOSAL
  Filled 2020-05-12 (×4): qty 15

## 2020-05-12 MED ORDER — PROCHLORPERAZINE 25 MG RE SUPP
12.5000 mg | Freq: Four times a day (QID) | RECTAL | Status: DC | PRN
  Filled 2020-05-12: qty 1

## 2020-05-12 MED ORDER — QUETIAPINE FUMARATE 25 MG PO TABS
25.0000 mg | ORAL_TABLET | Freq: Every day | ORAL | Status: DC
  Administered 2020-05-13 – 2020-05-14 (×2): 25 mg via ORAL
  Filled 2020-05-12 (×2): qty 1

## 2020-05-12 MED ORDER — ACETAMINOPHEN 650 MG RE SUPP
650.0000 mg | RECTAL | Status: DC | PRN
  Administered 2020-05-12 – 2020-05-13 (×2): 650 mg via RECTAL
  Filled 2020-05-12 (×2): qty 1

## 2020-05-12 MED ORDER — DIPHENHYDRAMINE HCL 12.5 MG/5ML PO ELIX
12.5000 mg | ORAL_SOLUTION | Freq: Four times a day (QID) | ORAL | Status: DC | PRN
  Filled 2020-05-12: qty 10

## 2020-05-12 MED ORDER — ENOXAPARIN SODIUM 40 MG/0.4ML ~~LOC~~ SOLN
40.0000 mg | SUBCUTANEOUS | Status: DC
  Administered 2020-05-12: 40 mg via SUBCUTANEOUS
  Filled 2020-05-12: qty 0.4

## 2020-05-12 MED ORDER — POLYETHYLENE GLYCOL 3350 17 G PO PACK: 17.0000 g | PACK | Freq: Every day | ORAL | Status: DC | PRN

## 2020-05-12 MED ORDER — QUETIAPINE FUMARATE 25 MG PO TABS: 75.0000 mg | ORAL_TABLET | Freq: Every day | ORAL | Status: DC

## 2020-05-12 MED ORDER — ACETAMINOPHEN 325 MG PO TABS: 325.0000 mg | ORAL_TABLET | ORAL | Status: DC | PRN

## 2020-05-12 MED ORDER — SODIUM CHLORIDE 0.9 % IV SOLN
1.0000 g | Freq: Three times a day (TID) | INTRAVENOUS | Status: DC
  Administered 2020-05-12: 1 g via INTRAVENOUS
  Filled 2020-05-12 (×3): qty 1

## 2020-05-12 MED ORDER — QUETIAPINE FUMARATE 25 MG PO TABS
75.0000 mg | ORAL_TABLET | Freq: Every day | ORAL | Status: DC
Start: 2020-05-13 — End: 2020-05-14
  Administered 2020-05-13: 23:00:00 75 mg via ORAL
  Filled 2020-05-12: qty 3

## 2020-05-12 MED ORDER — SODIUM CHLORIDE 0.9 % IV SOLN
200.0000 mg | Freq: Two times a day (BID) | INTRAVENOUS | Status: DC
  Administered 2020-05-12 – 2020-05-16 (×8): 200 mg via INTRAVENOUS
  Filled 2020-05-12 (×10): qty 20

## 2020-05-12 MED ORDER — CLONAZEPAM 0.5 MG PO TABS
0.5000 mg | ORAL_TABLET | Freq: Two times a day (BID) | ORAL | Status: DC
  Filled 2020-05-12 (×2): qty 1

## 2020-05-12 MED ORDER — SODIUM CHLORIDE 0.9 % IV SOLN: INTRAVENOUS | Status: DC

## 2020-05-12 MED ORDER — PANTOPRAZOLE SODIUM 40 MG IV SOLR
40.0000 mg | INTRAVENOUS | Status: DC
  Administered 2020-05-13 – 2020-05-15 (×3): 40 mg via INTRAVENOUS
  Filled 2020-05-12 (×3): qty 40

## 2020-05-12 MED ORDER — METOPROLOL TARTRATE 25 MG PO TABS
25.0000 mg | ORAL_TABLET | Freq: Two times a day (BID) | ORAL | Status: DC
  Administered 2020-05-12 – 2020-05-15 (×6): 25 mg via ORAL
  Filled 2020-05-12 (×6): qty 1

## 2020-05-12 MED ORDER — SODIUM CHLORIDE 0.9 % IV SOLN: INTRAVENOUS | Status: AC

## 2020-05-12 MED ORDER — ACETAMINOPHEN 325 MG PO TABS
650.0000 mg | ORAL_TABLET | ORAL | Status: DC | PRN
  Administered 2020-05-12 – 2020-05-15 (×4): 650 mg via ORAL
  Filled 2020-05-12 (×7): qty 2

## 2020-05-12 MED ORDER — BISACODYL 10 MG RE SUPP: 10.0000 mg | Freq: Every day | RECTAL | Status: DC | PRN

## 2020-05-12 MED ORDER — ENSURE ENLIVE PO LIQD
237.0000 mL | Freq: Three times a day (TID) | ORAL | Status: DC
  Administered 2020-05-14 – 2020-05-17 (×3): 237 mL via ORAL

## 2020-05-12 MED ORDER — ATORVASTATIN CALCIUM 10 MG PO TABS
20.0000 mg | ORAL_TABLET | Freq: Every evening | ORAL | Status: DC
  Administered 2020-05-13 – 2020-05-17 (×4): 20 mg via ORAL
  Filled 2020-05-12 (×5): qty 2

## 2020-05-12 MED ORDER — SODIUM CHLORIDE 0.9 % IV SOLN
1.0000 g | Freq: Three times a day (TID) | INTRAVENOUS | Status: DC
  Administered 2020-05-12 – 2020-05-14 (×5): 1 g via INTRAVENOUS
  Filled 2020-05-12 (×7): qty 1

## 2020-05-12 MED ORDER — DIAZEPAM 2 MG PO TABS: 2.0000 mg | ORAL_TABLET | Freq: Every day | ORAL | Status: DC

## 2020-05-12 MED ORDER — LORAZEPAM 2 MG/ML IJ SOLN: 2.0000 mg | INTRAMUSCULAR | Status: DC | PRN

## 2020-05-12 MED ORDER — FLEET ENEMA 7-19 GM/118ML RE ENEM: 1.0000 | ENEMA | Freq: Once | RECTAL | Status: DC | PRN

## 2020-05-12 MED ORDER — CLONAZEPAM 0.5 MG PO TABS: 0.5000 mg | ORAL_TABLET | Freq: Two times a day (BID) | ORAL | Status: DC

## 2020-05-12 MED ORDER — SODIUM CHLORIDE 0.9% FLUSH
3.0000 mL | Freq: Two times a day (BID) | INTRAVENOUS | Status: DC
  Administered 2020-05-12 – 2020-05-18 (×9): 3 mL via INTRAVENOUS

## 2020-05-12 MED ORDER — ORAL CARE MOUTH RINSE
15.0000 mL | Freq: Two times a day (BID) | OROMUCOSAL | Status: DC
  Administered 2020-05-16 – 2020-05-17 (×3): 15 mL via OROMUCOSAL

## 2020-05-12 MED ORDER — VALPROATE SODIUM 500 MG/5ML IV SOLN
500.0000 mg | Freq: Two times a day (BID) | INTRAVENOUS | Status: DC
  Administered 2020-05-13 – 2020-05-15 (×6): 500 mg via INTRAVENOUS
  Filled 2020-05-12 (×7): qty 5

## 2020-05-12 MED ORDER — IOHEXOL 300 MG/ML  SOLN
80.0000 mL | Freq: Once | INTRAMUSCULAR | Status: AC | PRN
  Administered 2020-05-12: 80 mL via INTRAVENOUS

## 2020-05-12 MED ORDER — DIVALPROEX SODIUM ER 500 MG PO TB24
500.0000 mg | ORAL_TABLET | Freq: Two times a day (BID) | ORAL | Status: DC
  Filled 2020-05-12 (×2): qty 1

## 2020-05-12 MED ORDER — MOMETASONE FURO-FORMOTEROL FUM 200-5 MCG/ACT IN AERO
2.0000 | INHALATION_SPRAY | Freq: Two times a day (BID) | RESPIRATORY_TRACT | Status: DC
  Administered 2020-05-12 – 2020-05-18 (×10): 2 via RESPIRATORY_TRACT
  Filled 2020-05-12: qty 8.8

## 2020-05-12 MED ORDER — ALUM & MAG HYDROXIDE-SIMETH 200-200-20 MG/5ML PO SUSP: 30.0000 mL | ORAL | Status: DC | PRN

## 2020-05-12 MED ORDER — BENZONATATE 100 MG PO CAPS
200.0000 mg | ORAL_CAPSULE | Freq: Three times a day (TID) | ORAL | Status: DC
  Administered 2020-05-12 – 2020-05-17 (×10): 200 mg via ORAL
  Filled 2020-05-12 (×14): qty 2

## 2020-05-12 MED ORDER — PHENOL 1.4 % MT LIQD: 1.0000 | Freq: Four times a day (QID) | OROMUCOSAL | Status: DC | PRN

## 2020-05-12 MED ORDER — DOCUSATE SODIUM 100 MG PO CAPS
100.0000 mg | ORAL_CAPSULE | Freq: Two times a day (BID) | ORAL | Status: DC
  Administered 2020-05-12 – 2020-05-18 (×7): 100 mg via ORAL
  Filled 2020-05-12 (×10): qty 1

## 2020-05-12 MED ORDER — MELATONIN 3 MG PO TABS: 3.0000 mg | ORAL_TABLET | Freq: Every evening | ORAL | Status: DC | PRN

## 2020-05-12 MED ORDER — PROCHLORPERAZINE EDISYLATE 10 MG/2ML IJ SOLN
5.0000 mg | Freq: Four times a day (QID) | INTRAMUSCULAR | Status: DC | PRN
  Filled 2020-05-12: qty 2

## 2020-05-12 MED ORDER — GUAIFENESIN-DM 100-10 MG/5ML PO SYRP
5.0000 mL | ORAL_SOLUTION | Freq: Four times a day (QID) | ORAL | Status: DC | PRN
  Administered 2020-05-13: 05:00:00 10 mL via ORAL
  Filled 2020-05-12: qty 10

## 2020-05-12 MED ORDER — PROCHLORPERAZINE MALEATE 5 MG PO TABS
5.0000 mg | ORAL_TABLET | Freq: Four times a day (QID) | ORAL | Status: DC | PRN
  Filled 2020-05-12: qty 2

## 2020-05-12 MED ORDER — ENOXAPARIN SODIUM 40 MG/0.4ML ~~LOC~~ SOLN
40.0000 mg | SUBCUTANEOUS | Status: DC
  Administered 2020-05-13 – 2020-05-18 (×6): 40 mg via SUBCUTANEOUS
  Filled 2020-05-12 (×6): qty 0.4

## 2020-05-12 MED ORDER — PANTOPRAZOLE SODIUM 40 MG IV SOLR
40.0000 mg | INTRAVENOUS | Status: DC
  Administered 2020-05-12: 40 mg via INTRAVENOUS
  Filled 2020-05-12: qty 40

## 2020-05-12 MED ORDER — IPRATROPIUM-ALBUTEROL 0.5-2.5 (3) MG/3ML IN SOLN: 3.0000 mL | RESPIRATORY_TRACT | Status: DC | PRN

## 2020-05-12 MED ORDER — LIP MEDEX EX OINT: TOPICAL_OINTMENT | CUTANEOUS | Status: DC | PRN

## 2020-05-12 MED ORDER — ADULT MULTIVITAMIN W/MINERALS CH
1.0000 | ORAL_TABLET | Freq: Every day | ORAL | Status: DC
  Administered 2020-05-16 – 2020-05-17 (×2): 1 via ORAL
  Filled 2020-05-12 (×4): qty 1

## 2020-05-12 MED ORDER — LACOSAMIDE 50 MG PO TABS: 200.0000 mg | ORAL_TABLET | Freq: Two times a day (BID) | ORAL | Status: DC

## 2020-05-12 NOTE — Progress Notes (Addendum)
[facility] PHYSICAL MEDICINE & REHABILITATION PROGRESS NOTE   Subjective/Complaints: Pt with significant fever, abdominal pain last night. Worsening lethargy today. ESBL E Coli in urine and blood cx, enterobacterales also in blood cx.  Hospitalist and ID both aware.  ROS: Limited due to cognitive/behavioral    Objective:   DG Abd 1 View  Result Date: 05/10/2020 CLINICAL DATA:  Bilateral lower quadrant abdominal pain EXAM: ABDOMEN - 1 VIEW COMPARISON:  05/01/2020 FINDINGS: Lung bases are clear. Esophageal tube has been removed. Nonobstructed gas pattern. Probable phleboliths in the right pelvis. No radiopaque calculi over the kidneys. IMPRESSION: Nonobstructed gas pattern. Electronically Signed   By: Donavan Foil M.D.   On: 05/10/2020 22:14   US RENAL  Result Date: 05/11/2020 CLINICAL DATA:  Acute kidney injury EXAM: RENAL / URINARY TRACT ULTRASOUND COMPLETE COMPARISON:  Ultrasound renal dated April 25, 2020 FINDINGS: Right Kidney: Renal measurements: 11 x 5.1 x 5.2 cm = volume: 153 mL. There is no hydronephrosis. The cortical echogenicity is slightly increased. Left Kidney: Renal measurements: 9.7 x 5.2 x 3.8 cm = volume: 101 mL. The cortical echogenicity is slightly increased. There is mild left-sided collecting system dilatation. The previously demonstrated hypoechoic lesion involving the left kidney is no longer well visualized. Bladder: Appears normal for degree of bladder distention. Other: None. IMPRESSION: 1. Echogenic kidneys bilaterally which can be seen in patients with medical renal disease. 2. Atrophic left kidney, similar to prior study. 3. Mild left-sided collecting system dilatation. 4. Previously demonstrated hypoechoic area arising from the lower pole the left kidney is not well appreciated on today's study. Electronically Signed   By: Constance Holster M.D.   On: 05/11/2020 22:27   DG CHEST PORT 1 VIEW  Result Date: 05/12/2020 CLINICAL DATA:  Shortness of breath, cough.  EXAM: PORTABLE CHEST 1 VIEW COMPARISON:  December 9, 21. FINDINGS: The heart size and mediastinal contours are within normal limits. Both lungs are clear. No visible pleural effusions or pneumothorax. Biapical pleuroparenchymal scarring. No acute osseous abnormality. IMPRESSION: No evidence of acute cardiopulmonary disease. Electronically Signed   By: Margaretha Sheffield MD   On: 05/12/2020 09:53   DG Chest Port 1 View  Result Date: 05/11/2020 CLINICAL DATA:  Acute kidney injury EXAM: PORTABLE CHEST 1 VIEW COMPARISON:  May 08, 2020 FINDINGS: The heart size is stable. The lung volumes are low. There are probable small bilateral pleural effusions with adjacent atelectasis, left worse than right. There is no pneumothorax, however evaluation of the right lung apex is limited secondary to overlapping osseous structures. There is no acute osseous abnormality. IMPRESSION: Low lung volumes with probable small bilateral pleural effusions, left greater than right. No definite focal infiltrate. Electronically Signed   By: Constance Holster M.D.   On: 05/11/2020 20:02   Recent Labs    05/11/20 1542 05/12/20 0403  WBC 18.4* 15.5*  HGB 9.9* 8.3*  HCT 30.6* 25.8*  PLT 300 233   Recent Labs    05/11/20 1542 05/12/20 0403  NA 134* 137  K 4.0 3.9  CL 95* 106  CO2 22 20*  GLUCOSE 153* 145*  BUN 17 12  CREATININE 1.42* 1.03*  CALCIUM 9.5 7.9*    Intake/Output Summary (Last 24 hours) at 05/12/2020 1137 Last data filed at 05/12/2020 0740 Gross per 24 hour  Intake 4501.9 ml  Output --  Net 4501.9 ml     Pressure Injury 04/22/20 Back Medial;Right Stage 2 -  Partial thickness loss of dermis presenting as a shallow open injury  with a red, pink wound bed without slough. Medical device pressure injury-shape of lopez valve (Active)  04/22/20 0800  Location: Back  Location Orientation: Medial;Right  Staging: Stage 2 -  Partial thickness loss of dermis presenting as a shallow open injury with a red,  pink wound bed without slough.  Wound Description (Comments): Medical device pressure injury-shape of lopez valve  Present on Admission: No    Physical Exam: Vital Signs Blood pressure (!) 98/44, pulse 92, temperature 99.9 F (37.7 C), resp. rate (!) 30, height 5\' 6"  (1.676 m), weight 61.3 kg, SpO2 96 %.  Constitutional: lethargic HEENT: EOMI, oral membranes moist Neck: supple Cardiovascular: RRR without murmur. No JVD    Respiratory/Chest:  Tachypneic, shallow breathing.   GI/Abdomen: BS +, non-tender, non-distended Ext: no clubbing, cyanosis, or edema Psych: lethargic, flat Skin: small back wound, some  petechiae on abdomen, upper legs  Neuro: lethargic. Cranial nerves 2-12 are intact. Sensory exam is normal. Reflexes are 1+ in all 4's.      Musculoskeletal: no focal tenderness on exam today     Assessment/Plan: 1. Functional deficits which require 3+ hours per day of interdisciplinary therapy in a comprehensive inpatient rehab setting.  Physiatrist is providing close team supervision and 24 hour management of active medical problems listed below.  Physiatrist and rehab team continue to assess barriers to discharge/monitor patient progress toward functional and medical goals  Care Tool:  Bathing    Body parts bathed by patient: Right arm,Left arm,Chest,Abdomen,Face,Front perineal area,Right upper leg,Left upper leg,Right lower leg,Left lower leg   Body parts bathed by helper: Right lower leg,Left lower leg,Buttocks (assist to reach feet, pt able to reach past knee level)     Bathing assist Assist Level: Minimal Assistance - Patient > 75%     Upper Body Dressing/Undressing Upper body dressing   What is the patient wearing?: Pull over shirt    Upper body assist Assist Level: Supervision/Verbal cueing    Lower Body Dressing/Undressing Lower body dressing      What is the patient wearing?: Pants,Incontinence brief     Lower body assist Assist for lower body  dressing: Moderate Assistance - Patient 50 - 74%     Toileting Toileting    Toileting assist Assist for toileting: Contact Guard/Touching assist     Transfers Chair/bed transfer  Transfers assist     Chair/bed transfer assist level: Maximal Assistance - Patient 25 - 49%     Locomotion Ambulation   Ambulation assist   Ambulation activity did not occur: Safety/medical concerns (required therapeutic intervention to ambulate safely)  Assist level: Minimal Assistance - Patient > 75% Assistive device: Hand held assist Max distance: 200'   Walk 10 feet activity   Assist  Walk 10 feet activity did not occur: Safety/medical concerns  Assist level: Minimal Assistance - Patient > 75% Assistive device: Hand held assist   Walk 50 feet activity   Assist Walk 50 feet with 2 turns activity did not occur: Safety/medical concerns  Assist level: Minimal Assistance - Patient > 75% Assistive device: Hand held assist    Walk 150 feet activity   Assist Walk 150 feet activity did not occur: Safety/medical concerns  Assist level: Minimal Assistance - Patient > 75% Assistive device: Hand held assist    Walk 10 feet on uneven surface  activity   Assist Walk 10 feet on uneven surfaces activity did not occur: Safety/medical concerns         Wheelchair     Assist Will  patient use wheelchair at discharge?:  (TBD but do not anticipate)             Wheelchair 50 feet with 2 turns activity    Assist            Wheelchair 150 feet activity     Assist          Blood pressure (!) 98/44, pulse 92, temperature 99.9 F (37.7 C), resp. rate (!) 30, height 5\' 6"  (1.676 m), weight 61.3 kg, SpO2 96 %.  Medical Problem List and Plan: 1.  Impaired mobility and ADLs secondary to left kidney pyelonephritis             -Discharging to acute d/t urosepsis, unstable 2.  Antithrombotics: -DVT/anticoagulation:  Pharmaceutical: Lovenox             -antiplatelet  therapy: N/A 3. Headaches/Pain Management: tylenol prn 4. Mood/delirium: LCSW to follow for evaluation and support.              -antipsychotic agents: N/A  12/4-pt on fairly large doses of seroquel currently which it doesn't appear she was on PTA--->reduce day time seroquel to 50mg  and night time dose to 100   12/5 pt was not placed on klonopin for addnl seizure prophylaxis   -will reduce to 0.5mg  TID today which is home dose   -continue same seroquel with plans to reduce these further this week  12/7 reduce seroquel to 75mg  qhs and 25mg  qam     -reduced klonopin to 0.5mg  bid  12/10 pt more lethargic d/t ID issues 5. Neuropsych: This patient is not fully capable of making decisions on her own behalf. 6. Skin/Wound Care: Routine pressure relief measures.  7. Fluids/Electrolytes/Nutrition: Monitor I/O. Follow up labs Monday.  8. Seizure disorder: Continue Vimpat 200 mg bid and Depakene 500 mg bid.             -- sleep wake chart to monitor sleep pattern.             --ammonia level nl  -depakote level 51   9. Dehydration: Improving. Encourage fluid intake --offer between meals. 10. Leucocytosis: Resolved 6.3 K on 12/6 11. Anemia: Due to critical illness? H/H has been in 8-9 range. 8.6 on 12/3. Will order anemia panel 12. Tachycardia: Monitor HR tid--on metoprolol bid. Currently better controlled.  14. Stress induced hyperglycemia: Hgb A1C-5.7. CBGs 86-123 on 12/3 15. Constipation: On multiple laxatives including daily suppository.    12/5 -had large liquid bowel movement yesterday   12/10 abdominal pain likely d/t UTI, see below 16.  Intermittent cough  -dulera, duoneb, robitussin dm  -IS, OOB  -CXR today really unremarkable 17. ESBL E Coli UTI/bacteremia, enterobacterales also on blood cx  -Spoke to Pharmacy re: changing to meropenem   -resume low rate IVF for now, added oxygen  -spoke with hospitalist and ID, will transfer to acute, progressive unit for closer monitoring.     LOS: 7 days A FACE TO FACE EVALUATION WAS PERFORMED  Meredith Staggers 05/12/2020, 11:37 AM

## 2020-05-12 NOTE — Progress Notes (Signed)
Pt with little sleep through the night totaling about 4 hours. Pt with frequent cough that subsides only while pt is sleeping then completely ceases. Pt given a cough suppressant and a respiratory treatment that only provided relief for a short time. Pt very alert while awake and frustrated with care. Pt currently on VS every 4 hours per MEWs protocol, but extra set of VS done at 0500 just for extra monitoring. HR was 108. Pt was sleeping peacefully with no cough when HR and BP done, but arroussed easily with temperature check.

## 2020-05-12 NOTE — H&P (Addendum)
History and Physical    Maria [name] IRW:[id number] DOB: July 14, 1960 DOA: 05/12/2020  PCP: Celene Squibb, MD   I saw this patient and documented a detailed progress note earlier today at inpatient rehab.  Transferring same information to this H&P.  I have briefly reviewed patients previous medical reports in [facility].  Patient coming from: CIR  Chief Complaint: High fevers  HPI: Maria [name] is a 59 year old female with past medical history of intellectual disability, seizure disorder, chronic diastolic CHF, GERD, chronic constipation, seen by [facility] in consultation on 05/11/2020 for new onset of fever and abnormal UA.  Further work-up revealed severe sepsis due to ESBL E. coli bacteremia from UTI.  Transferring patient from CIR to [facility] progressive care unit on 05/12/2020.  Low threshold to consult PCCM and transfer to ICU if she worsens.  Summary: Patient was originally admitted 04/14/2020 to [facility], diagnosed with septic shock requiring vasopressors, presumed from urinary source, transferred to [facility] ICU.  CT scanner at time of admission suspicious for pyelonephritis and small abscess in the lower pole of the left kidney.  Follow-up ultrasound 11 days later showed stable lesion felt to represent a small perinephric abscess versus renal cyst.  Blood cultures drawn after antibiotics were negative but urine culture grew E. coli-not ESBL.  Completed 14-day course of ceftriaxone.  Hospital course complicated by breakthrough seizures, delirium and agitation requiring Precedex drip.  Urology did not feel the abscess required any further intervention.  Subsequently transferred to CIR on 05/05/2020.  She was doing well until 05/10/2020 when she developed fever, UA suggestive of UTI, urine culture grew E. coli, briefly on Keflex, TRH consulted 12/9, final cultures were not back, treated with aggressive IV fluids due to sepsis, elevated lactate and AKI and changed to IV ceftriaxone.  BCID  12/10+ for gram-negative rods and final urine culture shows ESBL E. coli.  Antibiotics changed to IV meropenem, ID consulted.  Patient transferring to [facility] for close monitoring and management.   Review of Systems:  Unable to perform review of systems due to patient's significant cognitive impairment  Past Medical History:  Diagnosis Date  . Constipation   . Dysphagia   . Fracture    R foot  . GERD (gastroesophageal reflux disease)   . History of shingles 10/2016  . Mental retardation    lesion in head  . Seizures ([place])    "not fully controlled on max doses of meds" (Neuro ofc note 12/2014)  . Thrombocytopenia ([place])   . Tremor     Past Surgical History:  Procedure Laterality Date  . BIOPSY  09/16/2014   Procedure: BIOPSY;  Surgeon: Rogene Houston, MD;  Location: [facility] ORS;  Service: Endoscopy;;  . CATARACT EXTRACTION     both eyes, May of 2015  . COLONOSCOPY    . COLONOSCOPY WITH PROPOFOL N/A 11/20/2018   Procedure: COLONOSCOPY WITH PROPOFOL;  Surgeon: Rogene Houston, MD;  Location: [facility] ENDO SUITE;  Service: Endoscopy;  Laterality: N/A;  . ESOPHAGEAL DILATION N/A 09/16/2014   Procedure: ESOPHAGEAL DILATION WITH 54FR MALONEY DILATOR;  Surgeon: Rogene Houston, MD;  Location: [facility] ORS;  Service: Endoscopy;  Laterality: N/A;  . ESOPHAGOGASTRODUODENOSCOPY (EGD) WITH PROPOFOL N/A 09/16/2014   Procedure: ESOPHAGOGASTRODUODENOSCOPY (EGD) WITH PROPOFOL;  Surgeon: Rogene Houston, MD;  Location: [facility] ORS;  Service: Endoscopy;  Laterality: N/A;  . MOUTH SURGERY    . POLYPECTOMY  11/20/2018   Procedure: POLYPECTOMY;  Surgeon: Rogene Houston, MD;  Location:  [facility] ENDO SUITE;  Service: Endoscopy;;  colon  . Skin graft to gum Right 08/2013  . TONSILLECTOMY AND ADENOIDECTOMY    . TOTAL ABDOMINAL HYSTERECTOMY      Social History  reports that she quit smoking about 13 years ago. Her smoking use included cigarettes. She has a 22.50 pack-year smoking history. She has never used smokeless tobacco. She  reports that she does not drink alcohol and does not use drugs.  Allergies  Allergen Reactions  . Codeine Nausea And Vomiting  . Lamictal [Lamotrigine] Rash    Family History  Problem Relation Age of Onset  . High Cholesterol Mother   . High blood pressure Mother   . Diabetes Father      Prior to Admission medications   Medication Sig Start Date End Date Taking? Authorizing Provider  acetaminophen (TYLENOL) 500 MG tablet Take 1 tablet (500 mg total) by mouth every 6 (six) hours as needed for moderate pain. 05/05/20   Elgergawy, Silver Huguenin, MD  atorvastatin (LIPITOR) 20 MG tablet Take 20 mg by mouth [name]. 08/24/18   [provider]  clonazePAM (KLONOPIN) 1 MG tablet Take 1 tablet (1 mg total) by mouth 3 (three) times [name]. 05/05/20   Elgergawy, Silver Huguenin, MD  diazepam (VALIUM) 2 MG tablet Take 1 tablet (2 mg total) by mouth at bedtime. 05/05/20   Elgergawy, Silver Huguenin, MD  divalproex (DEPAKOTE ER) 500 MG 24 hr tablet Take 1 tablet (500 mg total) by mouth 2 (two) times [name]. 11/25/19   Suzzanne Cloud, NP  docusate sodium (COLACE) 100 MG capsule Take 1 capsule (100 mg total) by mouth 2 (two) times [name]. 05/05/20   Elgergawy, Silver Huguenin, MD  feeding supplement (ENSURE ENLIVE / ENSURE PLUS) LIQD Take 237 mLs by mouth 3 (three) times [name] between meals. 05/05/20   Elgergawy, Silver Huguenin, MD  ipratropium-albuterol (DUONEB) 0.5-2.5 (3) MG/3ML SOLN Take 3 mLs by nebulization every 4 (four) hours as needed. 05/05/20   Elgergawy, Silver Huguenin, MD  lacosamide (VIMPAT) 200 MG TABS tablet Take 1 tablet (200 mg total) by mouth 2 (two) times [name]. 11/25/19   Suzzanne Cloud, NP  metoprolol tartrate (LOPRESSOR) 25 MG tablet Take 1 tablet (25 mg total) by mouth 2 (two) times [name]. 05/05/20   Elgergawy, Silver Huguenin, MD  omeprazole (PRILOSEC) 40 MG capsule Take 1 capsule (40 mg total) by mouth [name]. 11/03/19   Minus Liberty, PA-C  oxybutynin (DITROPAN-XL) 5 MG 24 hr tablet Take 5 mg by mouth at bedtime.      [provider]  Pediatric Multiple Vit-C-FA (PEDIATRIC MULTIVITAMIN) chewable tablet Chew 1 tablet by mouth [name].      [provider]  QUEtiapine (SEROQUEL) 25 MG tablet Take 3 tablets (75 mg total) by mouth [name]. 05/05/20   Elgergawy, Silver Huguenin, MD  QUEtiapine (SEROQUEL) 50 MG tablet Take 3 tablets (150 mg total) by mouth at bedtime. 05/05/20   Elgergawy, Silver Huguenin, MD    Physical Exam: Vitals:   05/12/20 1727  BP: 138/69  Pulse: (!) 120  Resp: (!) 33  Temp: (!) 102.9 F (39.4 C)  TempSrc: Axillary  SpO2: 94%     General exam/constitutional: Middle-age female, moderately built, frail and chronically ill looking lying propped up in bed with mild tachypnea but otherwise in no discomfort. Eyes: Patient keeps eyes closed and unable to examine. ENT: Unable to examine due to lack of cooperation and inability to follow instructions Neck: Supple.  No JVD. Respiratory system: Occasional  fine basal crackles but otherwise clear to auscultation without wheezing, rhonchi. Mild tachypnea when I saw her this morning. Cardiovascular system: S1 & S2 heard, RRR. No JVD, murmurs, rubs, gallops or clicks. No pedal edema. Gastrointestinal system: Abdomen is nondistended, soft and nontender. No organomegaly or masses felt. Normal bowel sounds heard. Central nervous system: Mental status as noted above. No focal neurological deficits. Extremities: Symmetric 5 x 5 power. Skin: No rashes, lesions or ulcers Psychiatry: Judgement and insight impaired. Mood & affect cannot be assessed.    Labs on Admission: I have personally reviewed following labs and imaging studies  CBC: Recent Labs  Lab 05/08/20 0725 05/10/20 2205 05/11/20 0309 05/11/20 1542 05/12/20 0403  WBC 6.3 10.2 14.8* 18.4* 15.5*  NEUTROABS 3.8 8.1*  --  15.6*  --   HGB 9.2* 9.8* 8.9* 9.9* 8.3*  HCT 29.7* 30.4* 29.3* 30.6* 25.8*  MCV 96.1 93.3 95.8 93.9 94.2  PLT 371 337 301 300 536    Basic Metabolic  Panel: Recent Labs  Lab 05/06/20 0450 05/08/20 0725 05/11/20 1542 05/12/20 0403  NA 138 138 134* 137  K 3.8 3.8 4.0 3.9  CL 96* 99 95* 106  CO2 28 26 22  20*  GLUCOSE 117* 100* 153* 145*  BUN 21* 16 17 12   CREATININE 1.06* 1.12* 1.42* 1.03*  CALCIUM 9.9 9.7 9.5 7.9*    Liver Function Tests: Recent Labs  Lab 05/08/20 0725 05/12/20 0403  AST 29 17  ALT 42 19  ALKPHOS 111 76  BILITOT 0.6 0.4  PROT 7.2 5.6*  ALBUMIN 2.8* 2.2*    Urine analysis:    Component Value Date/Time   COLORURINE YELLOW 05/10/2020 1739   APPEARANCEUR CLOUDY (A) 05/10/2020 1739   LABSPEC 1.011 05/10/2020 1739   PHURINE 6.0 05/10/2020 1739   GLUCOSEU NEGATIVE 05/10/2020 1739   HGBUR MODERATE (A) 05/10/2020 1739   BILIRUBINUR NEGATIVE 05/10/2020 1739   KETONESUR NEGATIVE 05/10/2020 1739   PROTEINUR 100 (A) 05/10/2020 1739   UROBILINOGEN 0.2 10/26/2014 2340   NITRITE POSITIVE (A) 05/10/2020 1739   LEUKOCYTESUR LARGE (A) 05/10/2020 1739     Radiological Exams on Admission: DG Abd 1 View  Result Date: 05/10/2020 CLINICAL DATA:  Bilateral lower quadrant abdominal pain EXAM: ABDOMEN - 1 VIEW COMPARISON:  05/01/2020 FINDINGS: Lung bases are clear. Esophageal tube has been removed. Nonobstructed gas pattern. Probable phleboliths in the right pelvis. No radiopaque calculi over the kidneys. IMPRESSION: Nonobstructed gas pattern. Electronically Signed   By: Donavan Foil M.D.   On: 05/10/2020 22:14   US RENAL  Result Date: 05/11/2020 CLINICAL DATA:  Acute kidney injury EXAM: RENAL / URINARY TRACT ULTRASOUND COMPLETE COMPARISON:  Ultrasound renal dated April 25, 2020 FINDINGS: Right Kidney: Renal measurements: 11 x 5.1 x 5.2 cm = volume: 153 mL. There is no hydronephrosis. The cortical echogenicity is slightly increased. Left Kidney: Renal measurements: 9.7 x 5.2 x 3.8 cm = volume: 101 mL. The cortical echogenicity is slightly increased. There is mild left-sided collecting system dilatation. The  previously demonstrated hypoechoic lesion involving the left kidney is no longer well visualized. Bladder: Appears normal for degree of bladder distention. Other: None. IMPRESSION: 1. Echogenic kidneys bilaterally which can be seen in patients with medical renal disease. 2. Atrophic left kidney, similar to prior study. 3. Mild left-sided collecting system dilatation. 4. Previously demonstrated hypoechoic area arising from the lower pole the left kidney is not well appreciated on today's study. Electronically Signed   By: Constance Holster M.D.   On:  05/11/2020 22:27   DG CHEST PORT 1 VIEW  Result Date: 05/12/2020 CLINICAL DATA:  Shortness of breath, cough. EXAM: PORTABLE CHEST 1 VIEW COMPARISON:  December 9, 21. FINDINGS: The heart size and mediastinal contours are within normal limits. Both lungs are clear. No visible pleural effusions or pneumothorax. Biapical pleuroparenchymal scarring. No acute osseous abnormality. IMPRESSION: No evidence of acute cardiopulmonary disease. Electronically Signed   By: Margaretha Sheffield MD   On: 05/12/2020 09:53   DG Chest Port 1 View  Result Date: 05/11/2020 CLINICAL DATA:  Acute kidney injury EXAM: PORTABLE CHEST 1 VIEW COMPARISON:  May 08, 2020 FINDINGS: The heart size is stable. The lung volumes are low. There are probable small bilateral pleural effusions with adjacent atelectasis, left worse than right. There is no pneumothorax, however evaluation of the right lung apex is limited secondary to overlapping osseous structures. There is no acute osseous abnormality. IMPRESSION: Low lung volumes with probable small bilateral pleural effusions, left greater than right. No definite focal infiltrate. Electronically Signed   By: Constance Holster M.D.   On: 05/11/2020 20:02      Assessment/Plan Principal Problem:   Severe sepsis (HCC) Active Problems:   GERD (gastroesophageal reflux disease)   Intellectual disability   Seizure disorder (HCC)   AKI (acute  kidney injury) (HCC)   ESBL (extended spectrum beta-lactamase) producing bacteria infection   Bacteremia due to Escherichia coli      Severe sepsis secondary to ESBL E. coli bacteremia from UTI, concern for unresolved/recurrent renal abscess, with acute organ dysfunction (AKI, lactic acidosis, AMS):  Urine culture 05/10/2020: ESBL E. coli >100 K colonies per mL.  BCID: E. coli.  Follow final culture sensitivity results, likely to be the same ESBL E. coli from UTI.  Repeat chest x-ray 05/12/2020: No acute findings.  Renal ultrasound 05/11/2020: Mild left-sided collecting system dilatation.  Previously demonstrated hypoechoic area arising from the lower pole of the left kidney is not well appreciated on this study.  S/p aggressive IV fluid bolus and maintenance IV fluids since 12/9, possibly got >4 L.  Had cut down IVF to 10 mL/h due to new cough, some tachypnea, concern for pulmonary edema but chest x-ray negative, soft BP in the 90s, IV fluids back up to 50 mL/h.  Lactate peaked at 4.9, normalized now.  Procalcitonin 0.9 > 2.47  Leukocytosis better from 18 > 15.  S/p Keflex x1 on 12/9, IV ceftriaxone x1 on 12/9, IV meropenem 12/10 >  ID consulted, continue meropenem, requested repeat CT abdomen and pelvis with contrast to reevaluate possible renal abscess versus infected cyst.  If CT abdomen shows renal abscess, will need to reconsult urology  Acute kidney injury  Secondary to sepsis, dehydration  Resolved after aggressive IV fluid hydration, continue maintenance IV fluids, trend [name] BMP  Renal ultrasound with medical renal disease.  Lactic acidosis  Secondary to sepsis.  Resolved.  Seizure disorder  History of breakthrough seizures during prior hospitalization.  Seizure precautions and continue AEDs.  Meropenem could lower seizure threshold, need to monitor closely. Communicated with ID  Chronic diastolic CHF  2D echo 67/89/3810: LVEF 60-65%.  Some concern  this morning for developing pulmonary edema due to worsening cough, some tachypnea but apart from occasional basal crackles, clinically did not appear volume overloaded.  Chest x-ray without acute findings.  Monitor closely while on IV fluids.  Anemia, suspect chronic disease  Hemoglobin at baseline recently ranging in the 8-9 range.  Hemoglobin dropped from 9.9 yesterday to 8.3 likely  dilutional.  Follow CBC [name] and transfuse if hemoglobin 7 g or less.  GERD:  Continue PPI.  Acute septic encephalopathy complicating underlying intellectual disability  Per my discussion with Dr. Tessa Lerner, rehab MD earlier today, mental status at baseline.  After transfer to [facility], reportedly more lethargic.  Likely related to high fever and sepsis physiology.  Hold some of her sedative medications temporarily for tonight.  Discussed with pharmacy who will switch Depakote and Vimpat to IV.  They also plan to monitor Depakote levels because meropenem can alter that and they will continue to follow.  Communicated with RN extensively to monitor closely.    DVT prophylaxis: Lovenox  Code Status: Full Code  Family Communication: I discussed in detail with patient's mother via phone, updated care and answered questions. Updated her regarding her acute illness and critical nature. She verbalized understanding and was appreciative of the call. Disposition Plan:   Patient is from:  CIR  Anticipated DC to:  Unsure ? Back to CIR  Anticipated DC date:  > 3 days  Anticipated DC barriers: None known   Consults called: ID  Admission status: Inpatient.Progressive Unit   Severity of Illness: The appropriate patient status for this patient is INPATIENT. Inpatient status is judged to be reasonable and necessary in order to provide the required intensity of service to ensure the patient's safety. The patient's presenting symptoms, physical exam findings, and initial radiographic and laboratory data in the  context of their chronic comorbidities is felt to place them at high risk for further clinical deterioration. Furthermore, it is not anticipated that the patient will be medically stable for discharge from the hospital within 2 midnights of admission. The following factors support the patient status of inpatient.   " The patient's presenting symptoms include high fevers. " The worrisome physical exam findings include febrile, mild tachypnea, basal crackles. " The initial radiographic and laboratory data are worrisome because of chest x-ray negative. WBC 15.5.. " The chronic co-morbidities include intellectual disability. Seizure disorder..   * I certify that at the point of admission it is my clinical judgment that the patient will require inpatient hospital care spanning beyond 2 midnights from the point of admission due to high intensity of service, high risk for further deterioration and high frequency of surveillance required.Vernell Leep MD [facility]  To contact the attending provider between 7A-7P or the covering provider during after hours 7P-7A, please log into the web site [email or web address] and access using universal [place] password for that web site. If you do not have the password, please call the hospital operator.  05/12/2020, 5:53 PM

## 2020-05-12 NOTE — Progress Notes (Signed)
Pharmacy Consult  Pharmacy consulted for divalproex (Depakote) management. Patient on PTA at dose of 500 mg BID- which has been continued as inpatient. Has history of seizure disorder - on divalproex, vimpat, and clonazepam/valium PTA.  Started on meropenem today for ESBL E Coli in 2/4 blood cultures and in urine culture.   Of note, significant interaction between meropenem and divalproex - concurrent use may result in decrease valproic acid concentrations.    Ref. Range 05/08/2020 07:25  Valproic Acid,S Latest Ref Range: 50.0 - 100.0 ug/mL 51   Last level was in therapeutic range - given medication interactions, pharmacy will monitor closely and order next level on 12/12 to assess. If significant reduction or seizure occurrence, might need to consider alternative options.   Antonietta Jewel, PharmD, [place] Clinical Pharmacist  Phone: [phone] 05/12/2020 7:04 PM  Please check AMION for all [place] phone numbers After 10:00 PM, call [place] [phone]

## 2020-05-12 NOTE — Progress Notes (Signed)
Occupational Therapy Weekly Progress Note  Patient Details  Name: Maria [name] MRN: [id number] Date of Birth: [birth year]  Beginning of progress report period: May 06, 2020 End of progress report period: May 12, 2020  Today's Date: 05/12/2020 OT Missed Time: 75 Minutes Missed Time Reason: Patient Ill Patient not medically appropriate for therapy. Pt to transfer off of the unit, back to acute care.  Patient has met 0 of 3 short term goals.  Pt was beginning to make progress earlier this week, even walking to the bathroom with min A, but over the last few days, she has had increased lethargy, fever, and coughing which has limited her participation in therapy. We are hopeful that as she improves medically, so will also improve towards her rehab goals.  Patient continues to demonstrate the following deficits: muscle weakness, decreased cardiorespiratoy endurance, decreased initiation, decreased attention, decreased awareness, decreased problem solving, decreased safety awareness, decreased memory and delayed processing and decreased sitting balance, decreased standing balance, decreased postural control and decreased balance strategies and therefore will continue to benefit from skilled OT intervention to enhance overall performance with BADL and Reduce care partner burden.   Continue plan of care.  OT Short Term Goals Week 1:  OT Short Term Goal 1 (Week 1): Patient will maintain sitting balance at EOB with no more than mod A of 1 person within BADL task OT Short Term Goal 1 - Progress (Week 1): Progressing toward goal OT Short Term Goal 2 (Week 1): Pt will complete 1 step of toileting task OT Short Term Goal 2 - Progress (Week 1): Progressing toward goal OT Short Term Goal 3 (Week 1): Pt will complete sit<>stand with mod A of 1 perosn in preparation for BADL tasks OT Short Term Goal 3 - Progress (Week 1): Progressing toward goal Week 2:  OT Short Term Goal 1 (Week 2): Patient will  maintain sitting balance at EOB with no more than mod A of 1 person within BADL task OT Short Term Goal 2 (Week 2): Pt will complete 1 step of toileting task OT Short Term Goal 3 (Week 2): Pt will complete sit<>stand with mod A of 1 perosn in preparation for BADL tasks  Skilled Therapeutic Interventions/Progress Updates:      Therapy Documentation Precautions:  Precautions Precautions: Fall Restrictions Weight Bearing Restrictions: No General: General OT Amount of Missed Time: 75 Minutes PT Missed Treatment Reason: Patient ill (Comment) Pain: Pain Assessment Pain Scale: Faces Pain Score: Asleep   Therapy/Group: Individual Therapy  Valma Cava 05/12/2020, 1:44 PM

## 2020-05-12 NOTE — Progress Notes (Signed)
PHARMACY - PHYSICIAN COMMUNICATION CRITICAL VALUE ALERT - BLOOD CULTURE IDENTIFICATION (BCID)  Maria [name] is an 59 y.o. female who presented to [place] on 05/05/2020.  Developed fever on 05/11/20.  Ceftriaxone started  Assessment:  ESBL Ecoli detected.  Isolate also found in urine.    Name of physician (or Provider) Contacted: Dr. Naaman Plummer  Current antibiotics: Ceftriaxone  Changes to prescribed antibiotics recommended: Change ceftriaxone to meropenem 1g IV q8h Recommendations accepted by provider  Results for orders placed or performed during the hospital encounter of 05/05/20  Blood Culture ID Panel (Reflexed) (Collected: 05/11/2020  5:37 PM)  Result Value Ref Range   Enterococcus faecalis NOT DETECTED NOT DETECTED   Enterococcus Faecium NOT DETECTED NOT DETECTED   Listeria monocytogenes NOT DETECTED NOT DETECTED   Staphylococcus species NOT DETECTED NOT DETECTED   Staphylococcus aureus (BCID) NOT DETECTED NOT DETECTED   Staphylococcus epidermidis NOT DETECTED NOT DETECTED   Staphylococcus lugdunensis NOT DETECTED NOT DETECTED   Streptococcus species NOT DETECTED NOT DETECTED   Streptococcus agalactiae NOT DETECTED NOT DETECTED   Streptococcus pneumoniae NOT DETECTED NOT DETECTED   Streptococcus pyogenes NOT DETECTED NOT DETECTED   A.calcoaceticus-baumannii NOT DETECTED NOT DETECTED   Bacteroides fragilis NOT DETECTED NOT DETECTED   Enterobacterales DETECTED (A) NOT DETECTED   Enterobacter cloacae complex NOT DETECTED NOT DETECTED   Escherichia coli DETECTED (A) NOT DETECTED   Klebsiella aerogenes NOT DETECTED NOT DETECTED   Klebsiella oxytoca NOT DETECTED NOT DETECTED   Klebsiella pneumoniae NOT DETECTED NOT DETECTED   Proteus species NOT DETECTED NOT DETECTED   Salmonella species NOT DETECTED NOT DETECTED   Serratia marcescens NOT DETECTED NOT DETECTED   Haemophilus influenzae NOT DETECTED NOT DETECTED   Neisseria meningitidis NOT DETECTED NOT DETECTED    Pseudomonas aeruginosa NOT DETECTED NOT DETECTED   Stenotrophomonas maltophilia NOT DETECTED NOT DETECTED   Candida albicans NOT DETECTED NOT DETECTED   Candida auris NOT DETECTED NOT DETECTED   Candida glabrata NOT DETECTED NOT DETECTED   Candida krusei NOT DETECTED NOT DETECTED   Candida parapsilosis NOT DETECTED NOT DETECTED   Candida tropicalis NOT DETECTED NOT DETECTED   Cryptococcus neoformans/gattii NOT DETECTED NOT DETECTED   CTX-M ESBL DETECTED (A) NOT DETECTED   Carbapenem resistance IMP NOT DETECTED NOT DETECTED   Carbapenem resistance KPC NOT DETECTED NOT DETECTED   Carbapenem resistance NDM NOT DETECTED NOT DETECTED   Carbapenem resist OXA 48 LIKE NOT DETECTED NOT DETECTED   Carbapenem resistance VIM NOT DETECTED NOT DETECTED    Candie Mile 05/12/2020  11:09 AM

## 2020-05-12 NOTE — Progress Notes (Signed)
Pt being transported to 4E11 by Agricultural consultant. Left per bed with all belongings. No complications noted. Sheela Stack, LPN

## 2020-05-12 NOTE — Progress Notes (Addendum)
Report given to [facility] RN at 4E. Pt going to Maria [name]. CT coming to get pt, plan to take pt to 4E11 afterwards. [name]   2550: Called CT to follow up, CT behind schedule. Plan to take pt to 4E11 and they will get her from there.

## 2020-05-12 NOTE — Progress Notes (Addendum)
PROGRESS NOTE   Maria [name]  MBT:[id number]    DOB: Jun 12, 1960    DOA: 05/05/2020  PCP: Celene Squibb, MD   I have briefly reviewed patients previous medical records in [facility].  Chief complaint: Fever  Brief Narrative:  59 year old female with past medical history of intellectual disability, seizure disorder, chronic diastolic CHF, GERD, chronic constipation, seen by [facility] in consultation on 05/11/2020 for new onset of fever and abnormal UA.  Further work-up revealed severe sepsis due to ESBL E. coli bacteremia from UTI.  Transferring patient from CIR to [facility] progressive care unit on 05/12/2020.  Low threshold to consult PCCM and transfer to ICU if she worsens.  Summary: Patient was originally admitted 04/14/2020 to [facility], diagnosed with septic shock requiring vasopressors, presumed from urinary source, transferred to [facility] ICU.  CT scanner at time of admission suspicious for pyelonephritis and small abscess in the lower pole of the left kidney.  Follow-up ultrasound 11 days later showed stable lesion felt to represent a small perinephric abscess versus renal cyst.  Blood cultures drawn after antibiotics were negative but urine culture grew E. coli-not ESBL.  Completed 14-day course of ceftriaxone.  Hospital course complicated by breakthrough seizures, delirium and agitation requiring Precedex drip.  Urology did not feel the abscess required any further intervention.  Subsequently transferred to CIR on 05/05/2020.  She was doing well until 05/10/2020 when she developed fever, UA suggestive of UTI, urine culture grew E. coli, briefly on Keflex, TRH consulted 12/9, final cultures were not back, treated with aggressive IV fluids due to sepsis, elevated lactate and AKI and changed to IV ceftriaxone.  BCID 12/10+ for gram-negative rods and final urine culture shows ESBL E. coli.  Antibiotics changed to IV meropenem, ID consulted.  Patient transferring to [facility] for close monitoring and  management.  Assessment & Plan:  Principal Problem:   ESBL (extended spectrum beta-lactamase) producing bacteria infection Active Problems:   Pyelonephritis of left kidney   Renal abscess   Leukocytosis   Fever   AKI (acute kidney injury) ([place])   Sepsis secondary to UTI ([place])   Debility   Bacteremia due to Escherichia coli   Severe sepsis secondary to ESBL E. coli bacteremia from UTI, concern for unresolved/recurrent renal abscess, with acute organ dysfunction (AKI, lactic acidosis, AMS):  Urine culture 05/10/2020: ESBL E. coli >100 K colonies per mL.  BCID: E. coli.  Follow final culture sensitivity results, likely to be the same ESBL E. coli from UTI.  Repeat chest x-ray 05/12/2020: No acute findings.  Renal ultrasound 05/11/2020: Mild left-sided collecting system dilatation.  Previously demonstrated hypoechoic area arising from the lower pole of the left kidney is not well appreciated on this study.  S/p aggressive IV fluid bolus and maintenance IV fluids since 12/9, possibly got >4 L.  Had cut down IVF to 10 mL/h due to new cough, some tachypnea, concern for pulmonary edema but chest x-ray negative, soft BP in the 90s, IV fluids back up to 50 mL/h.  Lactate peaked at 4.9, normalized now.  Procalcitonin 0.9 > 2.47  Leukocytosis better from 18 > 15.  S/p Keflex x1 on 12/9, IV ceftriaxone x1 on 12/9, IV meropenem 12/10 >  ID consulted, continue meropenem, requested repeat CT abdomen and pelvis with contrast to reevaluate possible renal abscess versus infected cyst.  If CT abdomen shows renal abscess, will need to reconsult urology  Acute kidney injury  Secondary to sepsis, dehydration  Resolved after aggressive IV  fluid hydration, continue maintenance IV fluids, trend daily BMP  Renal ultrasound with medical renal disease.  Lactic acidosis  Secondary to sepsis.  Resolved.  Seizure disorder  History of breakthrough seizures during prior hospitalization.  Seizure  precautions and continue AEDs.  Meropenem could lower seizure threshold, need to monitor closely. Communicated with ID  Chronic diastolic CHF  2D echo 70/17/7939: LVEF 60-65%.  Some concern this morning for developing pulmonary edema due to worsening cough, some tachypnea but apart from occasional basal crackles, clinically did not appear volume overloaded.  Chest x-ray without acute findings.  Monitor closely while on IV fluids.  Anemia, suspect chronic disease  Hemoglobin at baseline recently ranging in the 8-9 range.  Hemoglobin dropped from 9.9 yesterday to 8.3 likely dilutional.  Follow CBC daily and transfuse if hemoglobin 7 g or less.  GERD:  Continue PPI.  Acute septic encephalopathy complicating underlying intellectual disability  Per my discussion with Dr. Tessa Lerner, rehab MD today, mental status at baseline.    DVT prophylaxis: enoxaparin (LOVENOX) injection 40 mg Start: 05/12/20 1400     Code Status: Full Code Family Communication:  Disposition:  Inpatient    Consultants:   ID TRH were consultants but upon transfer to [facility] will resume primary attending role.  Procedures:   None  Antimicrobials:    Anti-infectives (From admission, onward)   Start     Dose/Rate Route Frequency Ordered Stop   05/12/20 1200  meropenem (MERREM) 1 g in sodium chloride 0.9 % 100 mL IVPB        1 g 200 mL/hr over 30 Minutes Intravenous Every 8 hours 05/12/20 1108     05/11/20 2000  cefTRIAXone (ROCEPHIN) 1 g in sodium chloride 0.9 % 100 mL IVPB  Status:  Discontinued        1 g 200 mL/hr over 30 Minutes Intravenous Every 12 hours 05/11/20 1745 05/11/20 1837   05/11/20 1930  cefTRIAXone (ROCEPHIN) 2 g in sodium chloride 0.9 % 100 mL IVPB  Status:  Discontinued        2 g 200 mL/hr over 30 Minutes Intravenous Every 24 hours 05/11/20 1837 05/12/20 1108   05/11/20 1145  cephALEXin (KEFLEX) capsule 500 mg  Status:  Discontinued        500 mg Oral Every 12 hours 05/11/20 1057  05/11/20 1708        Subjective:  Patient seen this morning along with her female RN at bedside.  Somnolent, briefly opens eyes but mostly nonverbal, not oriented and does not follow instructions.  As per RN, has had cough for the last 3 days, progressively worsened overnight.  Mostly nonproductive.  Objective:   Vitals:   05/12/20 0707 05/12/20 1059 05/12/20 1118 05/12/20 1223  BP: (!) 115/92 (!) 100/51 (!) 98/44 (!) 101/55  Pulse: (!) 127 94 92 88  Resp: (!) 22 (!) 45 (!) 30 (!) 33  Temp: 99.4 F (37.4 C) (!) 100.9 F (38.3 C) 99.9 F (37.7 C) 100.1 F (37.8 C)  TempSrc:      SpO2: 98% 95% 96% 99%  Weight:      Height:        General exam: Middle-age female, moderately built, frail and chronically ill looking lying propped up in bed with mild tachypnea but otherwise in no discomfort. Respiratory system: Occasional fine basal crackles but otherwise clear to auscultation without wheezing, rhonchi. Mild tachypnea when I saw her this morning. Cardiovascular system: S1 & S2 heard, RRR. No JVD, murmurs, rubs, gallops or  clicks. No pedal edema. Gastrointestinal system: Abdomen is nondistended, soft and nontender. No organomegaly or masses felt. Normal bowel sounds heard. Central nervous system: Mental status as noted above. No focal neurological deficits. Extremities: Symmetric 5 x 5 power. Skin: No rashes, lesions or ulcers Psychiatry: Judgement and insight impaired. Mood & affect cannot be assessed.     Data Reviewed:   I have personally reviewed following labs and imaging studies   CBC: Recent Labs  Lab 05/08/20 0725 05/10/20 2205 05/11/20 0309 05/11/20 1542 05/12/20 0403  WBC 6.3 10.2 14.8* 18.4* 15.5*  NEUTROABS 3.8 8.1*  --  15.6*  --   HGB 9.2* 9.8* 8.9* 9.9* 8.3*  HCT 29.7* 30.4* 29.3* 30.6* 25.8*  MCV 96.1 93.3 95.8 93.9 94.2  PLT 371 337 301 300 941    Basic Metabolic Panel: Recent Labs  Lab 05/08/20 0725 05/11/20 1542 05/12/20 0403  NA 138 134* 137   K 3.8 4.0 3.9  CL 99 95* 106  CO2 26 22 20*  GLUCOSE 100* 153* 145*  BUN 16 17 12   CREATININE 1.12* 1.42* 1.03*  CALCIUM 9.7 9.5 7.9*    Liver Function Tests: Recent Labs  Lab 05/08/20 0725 05/12/20 0403  AST 29 17  ALT 42 19  ALKPHOS 111 76  BILITOT 0.6 0.4  PROT 7.2 5.6*  ALBUMIN 2.8* 2.2*    CBG: Recent Labs  Lab 05/11/20 2107 05/12/20 0551 05/12/20 1139  GLUCAP 113* 120* 111*    Microbiology Studies:   Recent Results (from the past 240 hour(s))  Culture, Urine     Status: Abnormal   Collection Time: 05/10/20  5:39 PM   Specimen: Urine, Clean Catch  Result Value Ref Range Status   Specimen Description URINE, CLEAN CATCH  Final   Special Requests   Final    NONE Performed at [facility], [place] [address], [place], [place] [address]    Culture (A)  Final    >=100,000 COLONIES/mL ESCHERICHIA COLI Confirmed Extended Spectrum Beta-Lactamase Producer (ESBL).  In bloodstream infections from ESBL organisms, carbapenems are preferred over piperacillin/tazobactam. They are shown to have a lower risk of mortality.    Report Status 05/12/2020 FINAL  Final   Organism ID, Bacteria ESCHERICHIA COLI (A)  Final      Susceptibility   Escherichia coli - MIC*    AMPICILLIN >=32 RESISTANT Resistant     CEFAZOLIN >=64 RESISTANT Resistant     CEFEPIME 16 RESISTANT Resistant     CEFTRIAXONE >=64 RESISTANT Resistant     CIPROFLOXACIN >=4 RESISTANT Resistant     GENTAMICIN >=16 RESISTANT Resistant     IMIPENEM <=0.25 SENSITIVE Sensitive     NITROFURANTOIN 128 RESISTANT Resistant     TRIMETH/SULFA >=320 RESISTANT Resistant     AMPICILLIN/SULBACTAM >=32 RESISTANT Resistant     PIP/TAZO 16 SENSITIVE Sensitive     * >=100,000 COLONIES/mL ESCHERICHIA COLI  Culture, blood (routine x 2)     Status: None (Preliminary result)   Collection Time: 05/11/20  5:37 PM   Specimen: BLOOD RIGHT HAND  Result Value Ref Range Status   Specimen Description BLOOD RIGHT HAND  Final    Special Requests   Final    BOTTLES DRAWN AEROBIC AND ANAEROBIC Blood Culture adequate volume   Culture  Setup Time   Final    GRAM NEGATIVE RODS IN BOTH AEROBIC AND ANAEROBIC BOTTLES CRITICAL RESULT CALLED TO, READ BACK BY AND VERIFIED WITH: Park Breed [id number] FCP Performed at [facility]  Lab, [address], [place], [place] [address]    Culture GRAM NEGATIVE RODS  Final   Report Status PENDING  Incomplete  Blood Culture ID Panel (Reflexed)     Status: Abnormal   Collection Time: 05/11/20  5:37 PM  Result Value Ref Range Status   Enterococcus faecalis NOT DETECTED NOT DETECTED Final   Enterococcus Faecium NOT DETECTED NOT DETECTED Final   Listeria monocytogenes NOT DETECTED NOT DETECTED Final   Staphylococcus species NOT DETECTED NOT DETECTED Final   Staphylococcus aureus (BCID) NOT DETECTED NOT DETECTED Final   Staphylococcus epidermidis NOT DETECTED NOT DETECTED Final   Staphylococcus lugdunensis NOT DETECTED NOT DETECTED Final   Streptococcus species NOT DETECTED NOT DETECTED Final   Streptococcus agalactiae NOT DETECTED NOT DETECTED Final   Streptococcus pneumoniae NOT DETECTED NOT DETECTED Final   Streptococcus pyogenes NOT DETECTED NOT DETECTED Final   A.calcoaceticus-baumannii NOT DETECTED NOT DETECTED Final   Bacteroides fragilis NOT DETECTED NOT DETECTED Final   Enterobacterales DETECTED (A) NOT DETECTED Final    Comment: Enterobacterales represent a large order of gram negative bacteria, not a single organism. CRITICAL RESULT CALLED TO, READ BACK BY AND VERIFIED WITH: PHARMD JEREMY F. [id number] FCP    Enterobacter cloacae complex NOT DETECTED NOT DETECTED Final   Escherichia coli DETECTED (A) NOT DETECTED Final    Comment: CRITICAL RESULT CALLED TO, READ BACK BY AND VERIFIED WITH: PHARMD JEREMY F. [id number] FCP    Klebsiella aerogenes NOT DETECTED NOT DETECTED Final   Klebsiella oxytoca NOT DETECTED NOT DETECTED Final   Klebsiella pneumoniae NOT  DETECTED NOT DETECTED Final   Proteus species NOT DETECTED NOT DETECTED Final   Salmonella species NOT DETECTED NOT DETECTED Final   Serratia marcescens NOT DETECTED NOT DETECTED Final   Haemophilus influenzae NOT DETECTED NOT DETECTED Final   Neisseria meningitidis NOT DETECTED NOT DETECTED Final   Pseudomonas aeruginosa NOT DETECTED NOT DETECTED Final   Stenotrophomonas maltophilia NOT DETECTED NOT DETECTED Final   Candida albicans NOT DETECTED NOT DETECTED Final   Candida auris NOT DETECTED NOT DETECTED Final   Candida glabrata NOT DETECTED NOT DETECTED Final   Candida krusei NOT DETECTED NOT DETECTED Final   Candida parapsilosis NOT DETECTED NOT DETECTED Final   Candida tropicalis NOT DETECTED NOT DETECTED Final   Cryptococcus neoformans/gattii NOT DETECTED NOT DETECTED Final   CTX-M ESBL DETECTED (A) NOT DETECTED Final    Comment: CRITICAL RESULT CALLED TO, READ BACK BY AND VERIFIED WITH: PHARMD JEREMY F. [id number] FCP (NOTE) Extended spectrum beta-lactamase detected. Recommend a carbapenem as initial therapy.      Carbapenem resistance IMP NOT DETECTED NOT DETECTED Final   Carbapenem resistance KPC NOT DETECTED NOT DETECTED Final   Carbapenem resistance NDM NOT DETECTED NOT DETECTED Final   Carbapenem resist OXA 48 LIKE NOT DETECTED NOT DETECTED Final   Carbapenem resistance VIM NOT DETECTED NOT DETECTED Final    Comment: Performed at [facility], [place] [address], [place], [place] [address]     Radiology Studies:  DG Abd 1 View  Result Date: 05/10/2020 CLINICAL DATA:  Bilateral lower quadrant abdominal pain EXAM: ABDOMEN - 1 VIEW COMPARISON:  05/01/2020 FINDINGS: Lung bases are clear. Esophageal tube has been removed. Nonobstructed gas pattern. Probable phleboliths in the right pelvis. No radiopaque calculi over the kidneys. IMPRESSION: Nonobstructed gas pattern. Electronically Signed   By: Donavan Foil M.D.   On: 05/10/2020 22:14   US RENAL  Result Date:  05/11/2020 CLINICAL DATA:  Acute kidney injury EXAM: RENAL / URINARY TRACT ULTRASOUND COMPLETE COMPARISON:  Ultrasound renal dated April 25, 2020 FINDINGS: Right Kidney: Renal measurements: 11 x 5.1 x 5.2 cm = volume: 153 mL. There is no hydronephrosis. The cortical echogenicity is slightly increased. Left Kidney: Renal measurements: 9.7 x 5.2 x 3.8 cm = volume: 101 mL. The cortical echogenicity is slightly increased. There is mild left-sided collecting system dilatation. The previously demonstrated hypoechoic lesion involving the left kidney is no longer well visualized. Bladder: Appears normal for degree of bladder distention. Other: None. IMPRESSION: 1. Echogenic kidneys bilaterally which can be seen in patients with medical renal disease. 2. Atrophic left kidney, similar to prior study. 3. Mild left-sided collecting system dilatation. 4. Previously demonstrated hypoechoic area arising from the lower pole the left kidney is not well appreciated on today's study. Electronically Signed   By: Constance Holster M.D.   On: 05/11/2020 22:27   DG CHEST PORT 1 VIEW  Result Date: 05/12/2020 CLINICAL DATA:  Shortness of breath, cough. EXAM: PORTABLE CHEST 1 VIEW COMPARISON:  December 9, 21. FINDINGS: The heart size and mediastinal contours are within normal limits. Both lungs are clear. No visible pleural effusions or pneumothorax. Biapical pleuroparenchymal scarring. No acute osseous abnormality. IMPRESSION: No evidence of acute cardiopulmonary disease. Electronically Signed   By: Margaretha Sheffield MD   On: 05/12/2020 09:53   DG Chest Port 1 View  Result Date: 05/11/2020 CLINICAL DATA:  Acute kidney injury EXAM: PORTABLE CHEST 1 VIEW COMPARISON:  May 08, 2020 FINDINGS: The heart size is stable. The lung volumes are low. There are probable small bilateral pleural effusions with adjacent atelectasis, left worse than right. There is no pneumothorax, however evaluation of the right lung apex is limited  secondary to overlapping osseous structures. There is no acute osseous abnormality. IMPRESSION: Low lung volumes with probable small bilateral pleural effusions, left greater than right. No definite focal infiltrate. Electronically Signed   By: Constance Holster M.D.   On: 05/11/2020 20:02     Scheduled Meds:   . atorvastatin  20 mg Oral QPM  . bisacodyl  10 mg Rectal QPC supper  . chlorhexidine  15 mL Mouth Rinse BID  . clonazePAM  0.5 mg Oral BID  . diazepam  2 mg Oral QHS  . divalproex  500 mg Oral BID  . docusate sodium  100 mg Oral BID  . enoxaparin (LOVENOX) injection  40 mg Subcutaneous Q24H  . feeding supplement  237 mL Oral TID BM  . lacosamide  200 mg Oral BID  . mouth rinse  15 mL Mouth Rinse q12n4p  . metoprolol tartrate  25 mg Oral BID  . mometasone-formoterol  2 puff Inhalation BID  . multivitamin with minerals  1 tablet Oral Daily  . pantoprazole  40 mg Oral Q supper  . QUEtiapine  25 mg Oral Daily  . QUEtiapine  75 mg Oral QHS    Continuous Infusions:   . sodium chloride 50 mL/hr at 05/12/20 1230  . meropenem (MERREM) IV 1 g (05/12/20 1232)     LOS: 7 days     Vernell Leep, MD, [place], [facility]. [facility]    To contact the attending provider between 7A-7P or the covering provider during after hours 7P-7A, please log into the web site [email or web address] and access using universal [place] password for that web site. If you do not have the password, please call the hospital operator.  05/12/2020, 1:05 PM

## 2020-05-12 NOTE — Progress Notes (Signed)
Speech Language Pathology Weekly Progress and Session Note  Patient Details  Name: Maria [name] MRN: [id number] Date of Birth: December 26, 1960  Beginning of progress report period: May 05, 2020 End of progress report period: May 12, 2020  Today's Date: 05/12/2020 SLP Individual Time: 4888-9169 SLP Individual Time Calculation (min): 15 min and Today's Date: 05/12/2020 SLP Missed Time: 30 Minutes Missed Time Reason: Patient fatigue;Patient ill (Comment)  Short Term Goals: Week 1: SLP Short Term Goal 1 (Week 1): Pt will tolerate trials of dysphagia 4 consistency and thin liquids with no overt s/sx of aspiration or penetration on 9/10 trials. SLP Short Term Goal 1 - Progress (Week 1): Not met SLP Short Term Goal 2 (Week 1): Pt will demonstrate adequate safety awareness for hospital situation with 85% accuracy min a verbal cues. SLP Short Term Goal 2 - Progress (Week 1): Not met SLP Short Term Goal 3 (Week 1): Pt will demonstrate orientation x4 using external aids with min A verbal cues. SLP Short Term Goal 3 - Progress (Week 1): Not met SLP Short Term Goal 4 (Week 1): Pt will sustain attention to functional tasks for 5-6 minutes with min A verbal cues. SLP Short Term Goal 4 - Progress (Week 1): Met    New Short Term Goals: Week 2: SLP Short Term Goal 1 (Week 2): Patient will consume current diet with minimal overt s/s of aspiration with Supervision verbal cues for use of swallowing compensatory strategies. SLP Short Term Goal 2 (Week 2): Patient will consume trials of regular textures and demonstrate efficient mastication with complete oral clearance without overt s/s of aspiration with supervision verbal cues over 2 sessions prior to upgrade. SLP Short Term Goal 3 (Week 2): Patient will demonstrate orientation to place, month and situation with Min verbal cues. SLP Short Term Goal 4 (Week 2): Patient will demonstrate sustained attention ot functional tasks for 30 minutes with Min  verbal cues for redirection. SLP Short Term Goal 5 (Week 2): Patient will utilize call bell to request assistance with Min verbal cues in 75% of opportunities.  Weekly Progress Updates: Patient has made inconsistent gains has met 1 of 4 STGs this reporting period. Currently, patient is consuming Dys. 3 textures with thin liquids with minimal overt s/s of aspiration and overall Min verbal cues for encouragement with PO intake. Patent also requires overall Mod-Max A verbal cues to complete functional and familiar tasks safely in regards to sustained attention, orientation and awareness. Patient and family education ongoing. Patient would benefit from continued skilled SLP intervention to maximize her swallowing and cognitive functioning prior to discharge.      Intensity: Minumum of 1-2 x/day, 30 to 90 minutes Frequency: 3 to 5 out of 7 days Duration/Length of Stay: 05/25/20 Treatment/Interventions: Cognitive remediation/compensation;Patient/family education;Internal/external aids;Dysphagia/aspiration precaution training;Therapeutic Activities;Environmental controls;Cueing hierarchy;Functional tasks   Daily Session  Skilled Therapeutic Interventions: Patient initially declined all food and liquids. Patient attempted to see patient again 15 minutes later and patient was receiving medications crushed in puree. Patient with intermittent holding that resulted in overt explosive coughing episodes. Remaining medications were suctioned out of her oral cavity. With encouragement and Max verbal cues, patient consumed 2 more bites of medications alternated with plain applesauce without overt s/s of aspiration. Patient missed remaining 30 minutes of session. Continue with current plan of care.      Pain Pain Assessment Pain Scale: Faces Pain Score: 0-No pain  Therapy/Group: Individual Therapy  Markanthony Gedney 05/12/2020, 6:40 AM

## 2020-05-12 NOTE — Progress Notes (Signed)
2L O2 nasal cannula applied for pt comfort/tachypnea per MD Naaman Plummer orders.  Sheela Stack

## 2020-05-12 NOTE — Progress Notes (Signed)
Physical Therapy Session Note  Patient Details  Name: Maria [name] MRN: [id number] Date of Birth: Oct 12, 1960  Today's Date: 05/12/2020 PT Missed Time: 75 Minutes Missed Time Reason: Patient ill (Comment)  Short Term Goals: Week 1:  PT Short Term Goal 1 (Week 1): Pt will perform supine<>sit with min assist PT Short Term Goal 2 (Week 1): Pt will perform sit<>stands using LRAD with min assist PT Short Term Goal 3 (Week 1): Pt will perform bed<>chair transfers using LRAD with min assist PT Short Term Goal 4 (Week 1): Pt will ambulate at least 5ft using LRAD with min assist PT Short Term Goal 5 (Week 1): Pt will ascend/descend at least 1 step using LRAD with mod assist  Skilled Therapeutic Interventions/Progress Updates:     Pt extremely lethargic. Does not open eyes to verbal stimuli or sternal rub. PT will follow up as appropriate.  Therapy Documentation Precautions:  Precautions Precautions: Fall Restrictions Weight Bearing Restrictions: No    Therapy/Group: Individual Therapy  Breck Coons, PT, DPT 05/12/2020, 12:06 PM

## 2020-05-12 NOTE — Progress Notes (Signed)
Addendum  I went back and reassessed patient just now.  Appears to be more alert, opens eyes, mumbles incomprehensibly.  Still does not follow instructions.  Intermittent dry hacking cough.  Oral mucosa dry.  Mild tachycardia in the 110s, no obvious respiratory distress, respiratory rate in the low 20s.  RS-clear to auscultation. I just received rectal Tylenol, has ice packs on.  Continue current management.  Close monitoring.  Discussed with RN at bedside.  We will update [facility] night coverage as well.  Vernell Leep, MD, [place], [facility]. [facility]  To contact the attending provider between 7A-7P or the covering provider during after hours 7P-7A, please log into the web site [email or web address] and access using universal [place] password for that web site. If you do not have the password, please call the hospital operator.

## 2020-05-12 NOTE — Progress Notes (Signed)
   05/12/20 1059  Assess: MEWS Score  Temp (!) 100.9 F (38.3 C)  BP (!) 100/51  Pulse Rate 94  Resp (!) 45  SpO2 95 %  O2 Device Room Air  Assess: MEWS Score  MEWS Temp 1  MEWS Systolic 1  MEWS Pulse 0  MEWS RR 3  MEWS LOC 0  MEWS Score 5  MEWS Score Color Red  Assess: if the MEWS score is Yellow or Red  Were vital signs taken at a resting state? Yes  Focused Assessment Change from prior assessment (see assessment flowsheet)  Early Detection of Sepsis Score *See Row Information* High  MEWS guidelines implemented *See Row Information* Yes  Treat  MEWS Interventions Escalated (See documentation below) (Following Q2H MEWS protocol)  Pain Scale Faces  Pain Score Asleep  Take Vital Signs  Increase Vital Sign Frequency  Red: Q 1hr X 4 then Q 4hr X 4, if remains red, continue Q 4hrs  Escalate  MEWS: Escalate Red: discuss with charge nurse/RN and provider, consider discussing with RRT  Notify: Charge Nurse/RN  Name of Charge Nurse/RN Notified Microbiologist  Date Charge Nurse/RN Notified 05/11/20  Time Charge Nurse/RN Notified 1107  Notify: Provider  Provider Name/Title Reesa Chew  Date Provider Notified 05/11/20  Time Provider Notified 1108  Notification Type Call  Notification Reason Change in status  Response Other (Comment) (Calm pt respirations, recheck vitals)  Date of Provider Response 05/11/20  Time of Provider Response 1109  Document  Patient Outcome Other (Comment) (continue treatment)  Progress note created (see row info) Yes

## 2020-05-12 NOTE — Consult Note (Signed)
[facility]    Date of Admission:  05/05/2020     Current antibiotics: Day 1 meropenem  Previous antibiotics: Ceftriaxone 11/12--11/25, 12/9 x 1 dose Cephalexin 12/9 x 1 dose Piperacillin tazobactam 11/14 Azithromycin 11/14 Fluconazole 11/21   Reason for Consult: ESBL E. coli bacteremia     Referring Physician: Dr Algis Liming  ASSESSMENT:    #ESBL bacteremia from urinary source #Possible renal abscess versus cyst #Severe sepsis secondary to above #Acute kidney injury secondary to above; improving #Leukocytosis, fevers secondary to above   PLAN:    --Continue meropenem --Repeat CT scan to reassess possible renal abscess versus cyst --Follow-up cultures --Will follow   MEDICATIONS:    Scheduled Meds: . atorvastatin  20 mg Oral QPM  . bisacodyl  10 mg Rectal QPC supper  . chlorhexidine  15 mL Mouth Rinse BID  . clonazePAM  0.5 mg Oral BID  . diazepam  2 mg Oral QHS  . divalproex  500 mg Oral BID  . docusate sodium  100 mg Oral BID  . enoxaparin (LOVENOX) injection  40 mg Subcutaneous Q24H  . feeding supplement  237 mL Oral TID BM  . lacosamide  200 mg Oral BID  . mouth rinse  15 mL Mouth Rinse q12n4p  . metoprolol tartrate  25 mg Oral BID  . mometasone-formoterol  2 puff Inhalation BID  . multivitamin with minerals  1 tablet Oral Daily  . pantoprazole  40 mg Oral Q supper  . QUEtiapine  25 mg Oral Daily  . QUEtiapine  75 mg Oral QHS    Continuous Infusions: . sodium chloride 50 mL/hr at 05/12/20 1230  . meropenem (MERREM) IV 1 g (05/12/20 1232)    PRN Meds: acetaminophen, acetaminophen, alum & mag hydroxide-simeth, bisacodyl, diphenhydrAMINE, guaiFENesin-dextromethorphan, ipratropium-albuterol, lip balm, LORazepam, melatonin, phenol, polyethylene glycol, prochlorperazine **OR** prochlorperazine **OR** prochlorperazine, sodium phosphate  HPI:    Maria [name] is a 59 y.o. female with history of intellectual disability,  seizure disorder, heart failure with preserved ejection fraction, GERD who was originally admitted April 14, 2020 to [facility] with worsening abdominal pain found to be in septic shock presumed to be from a urinary source and ultimately requiring transferred to [facility] ICU.  CT scan at the time of admission was suspicious for pyelonephritis and small abscess in the lower pole of the left kidney.  Follow-up ultrasound 11 days later showed stable lesion felt to represent a small perinephric abscess versus renal cyst.  At the time of admission her blood cultures were no growth x2, however, were drawn after being given antibiotics.  Urine culture grew E. coli.  She was treated with ceftriaxone for 14 days.  Her hospital course was otherwise complicated by breakthrough seizures, delirium, agitation.  She was subsequently admitted to inpatient rehab where she has been since December 3.  She was apparently doing well in rehab until December 8 when she was noted to be febrile.  Urinalysis was notable for positive nitrites, greater than 50 white cells, and 6-10 squamous epithelial cells.  Urine culture has grown greater than 100,000 colonies per mL of ESBL E. coli.  Blood cultures are also positive for gram-negative rods with BC ID detecting ESBL.  She was initially treated with ceftriaxone at the onset of her fevers, however, has been transitioned to meropenem with this new development.  She is in the process of being transferred from the rehab floor to the progressive unit and getting IV fluid resuscitation.  Repeat renal ultrasound yesterday showed mild left-sided collecting system dilatation and previously demonstrated hypoechoic area was not appreciated.  Hospital notes indicate that her CT findings were discussed with urology and this was felt to be an old cyst present on previous CT scans in 2014 without changes.  CT scan from 12/2012 notes a 9 mm cyst arising from the inferior pole of the left kidney.   CT scan this admission notes a 2.5 x 2.4 cm lesion.   Past Medical History:  Diagnosis Date  . Constipation   . Dysphagia   . Fracture    R foot  . GERD (gastroesophageal reflux disease)   . History of shingles 10/2016  . Mental retardation    lesion in head  . Seizures ([place])    "not fully controlled on max doses of meds" (Neuro ofc note 12/2014)  . Thrombocytopenia ([place])   . Tremor     Social History   Tobacco Use  . Smoking status: Former Smoker    Packs/day: 1.50    Years: 15.00    Pack years: 22.50    Types: Cigarettes    Quit date: 05/22/2006    Years since quitting: 13.9  . Smokeless tobacco: Never Used  Vaping Use  . Vaping Use: Never used  Substance Use Topics  . Alcohol use: No    Alcohol/week: 0.0 standard drinks  . Drug use: No    Family History  Problem Relation Age of Onset  . High Cholesterol Mother   . High blood pressure Mother   . Diabetes Father     Allergies  Allergen Reactions  . Codeine Nausea And Vomiting  . Lamictal [Lamotrigine] Rash    Review of Systems  Unable to perform ROS: Mental acuity    OBJECTIVE:   Blood pressure (!) 101/55, pulse 88, temperature 100.1 F (37.8 C), resp. rate (!) 33, height 5\' 6"  (1.676 m), weight 61.3 kg, SpO2 99 %. Body mass index is 21.81 kg/m.  Physical Exam Constitutional:      Comments: Somnolent, lethargic, chronically ill-appearing woman lying in bed  HENT:     Head: Normocephalic and atraumatic.     Nose: Nose normal.  Eyes:     General: No scleral icterus.    Conjunctiva/sclera: Conjunctivae normal.  Cardiovascular:     Rate and Rhythm: Normal rate and regular rhythm.     Heart sounds: No murmur heard.   Pulmonary:     Breath sounds: Normal breath sounds.     Comments: Mildly labored respirations on nasal cannula Abdominal:     General: There is no distension.     Palpations: Abdomen is soft.     Tenderness: There is no abdominal tenderness. There is no guarding or rebound.   Genitourinary:    Comments: External urinary catheter in place Musculoskeletal:     Right lower leg: No edema.     Left lower leg: No edema.  Skin:    General: Skin is warm and dry.     Findings: No rash.  Neurological:     Comments: She will briefly open her eyes to voice or painful stimuli but otherwise does not respond       Lab Results & Microbiology Lab Results  Component Value Date   WBC 15.5 (H) 05/12/2020   HGB 8.3 (L) 05/12/2020   HCT 25.8 (L) 05/12/2020   MCV 94.2 05/12/2020   PLT 233 05/12/2020    Lab Results  Component Value Date   NA 137 05/12/2020  K 3.9 05/12/2020   CO2 20 (L) 05/12/2020   GLUCOSE 145 (H) 05/12/2020   BUN 12 05/12/2020   CREATININE 1.03 (H) 05/12/2020   CALCIUM 7.9 (L) 05/12/2020   GFRNONAA >60 05/12/2020   GFRAA >60 06/11/2017    Lab Results  Component Value Date   ALT 19 05/12/2020   AST 17 05/12/2020   ALKPHOS 76 05/12/2020   BILITOT 0.4 05/12/2020    C-Reactive Protein  No results found for: CRP  Erythrocyte Sedimentation Rate  No results found for: ESRSEDRATE    I have reviewed the micro and lab results in Epic.  Imaging DG Abd 1 View  Result Date: 05/10/2020 CLINICAL DATA:  Bilateral lower quadrant abdominal pain EXAM: ABDOMEN - 1 VIEW COMPARISON:  05/01/2020 FINDINGS: Lung bases are clear. Esophageal tube has been removed. Nonobstructed gas pattern. Probable phleboliths in the right pelvis. No radiopaque calculi over the kidneys. IMPRESSION: Nonobstructed gas pattern. Electronically Signed   By: Donavan Foil M.D.   On: 05/10/2020 22:14   US RENAL  Result Date: 05/11/2020 CLINICAL DATA:  Acute kidney injury EXAM: RENAL / URINARY TRACT ULTRASOUND COMPLETE COMPARISON:  Ultrasound renal dated April 25, 2020 FINDINGS: Right Kidney: Renal measurements: 11 x 5.1 x 5.2 cm = volume: 153 mL. There is no hydronephrosis. The cortical echogenicity is slightly increased. Left Kidney: Renal measurements: 9.7 x 5.2 x 3.8 cm =  volume: 101 mL. The cortical echogenicity is slightly increased. There is mild left-sided collecting system dilatation. The previously demonstrated hypoechoic lesion involving the left kidney is no longer well visualized. Bladder: Appears normal for degree of bladder distention. Other: None. IMPRESSION: 1. Echogenic kidneys bilaterally which can be seen in patients with medical renal disease. 2. Atrophic left kidney, similar to prior study. 3. Mild left-sided collecting system dilatation. 4. Previously demonstrated hypoechoic area arising from the lower pole the left kidney is not well appreciated on today's study. Electronically Signed   By: Constance Holster M.D.   On: 05/11/2020 22:27   DG CHEST PORT 1 VIEW  Result Date: 05/12/2020 CLINICAL DATA:  Shortness of breath, cough. EXAM: PORTABLE CHEST 1 VIEW COMPARISON:  December 9, 21. FINDINGS: The heart size and mediastinal contours are within normal limits. Both lungs are clear. No visible pleural effusions or pneumothorax. Biapical pleuroparenchymal scarring. No acute osseous abnormality. IMPRESSION: No evidence of acute cardiopulmonary disease. Electronically Signed   By: Margaretha Sheffield MD   On: 05/12/2020 09:53   DG Chest Port 1 View  Result Date: 05/11/2020 CLINICAL DATA:  Acute kidney injury EXAM: PORTABLE CHEST 1 VIEW COMPARISON:  May 08, 2020 FINDINGS: The heart size is stable. The lung volumes are low. There are probable small bilateral pleural effusions with adjacent atelectasis, left worse than right. There is no pneumothorax, however evaluation of the right lung apex is limited secondary to overlapping osseous structures. There is no acute osseous abnormality. IMPRESSION: Low lung volumes with probable small bilateral pleural effusions, left greater than right. No definite focal infiltrate. Electronically Signed   By: Constance Holster M.D.   On: 05/11/2020 20:02        [facility]  [facility] [phone] pager 05/12/2020, 12:35 PM

## 2020-05-12 NOTE — Significant Event (Addendum)
Rapid Response Note   Reason for Call :  MEWS: T 102.70F, BP 138/69, HR 120, RR 33, SpO2 94% on 2LNC Pt is a new admit from inpatient rehab for management of sepsis. Per RN, admitting MD is writing orders for pt. RN not requesting assistance from RR RN at this time. RN to review new orders from provider and call rapid response if assistance is needed.   RN to refer to MEWS guidelines for frequency of vital signs.  Call Time: [id number]  Addendum 05/12/2020 1840: Rounded on pt. Pt lying in bed. Awake, moaning. She is hot, dry to touch. Rectal temperature 103F. 650mg  rectal Tylenol given. Ice packs applied to bilateral axilla and groin. IVF running at 50cc/hr. Lung sounds are clear. Abdomen is soft. Pt appears to have general discomfort. Petechiae noted on pt's chest and abdomen.  VS: T 103F, BP 135/71, HR 116, RR 34, SpO2 99% on 2LNC  Plan of Care :  -VS per MEWS/unit protocol -Reevaluate VS 1 hour post- intervention  Call rapid response for additional needs.   Arrival Time: 1840 End Time: [place], RN

## 2020-05-13 ENCOUNTER — Inpatient Hospital Stay (HOSPITAL_COMMUNITY): Payer: Medicare Other

## 2020-05-13 DIAGNOSIS — N179 Acute kidney failure, unspecified: Secondary | ICD-10-CM

## 2020-05-13 DIAGNOSIS — A4151 Sepsis due to Escherichia coli [E. coli]: Principal | ICD-10-CM

## 2020-05-13 DIAGNOSIS — K219 Gastro-esophageal reflux disease without esophagitis: Secondary | ICD-10-CM

## 2020-05-13 DIAGNOSIS — Z1612 Extended spectrum beta lactamase (ESBL) resistance: Secondary | ICD-10-CM

## 2020-05-13 DIAGNOSIS — G40909 Epilepsy, unspecified, not intractable, without status epilepticus: Secondary | ICD-10-CM

## 2020-05-13 DIAGNOSIS — R6520 Severe sepsis without septic shock: Secondary | ICD-10-CM

## 2020-05-13 LAB — CBC
HCT: 23.7 % — ABNORMAL LOW (ref 36.0–46.0)
Hemoglobin: 7.7 g/dL — ABNORMAL LOW (ref 12.0–15.0)
MCH: 30.6 pg (ref 26.0–34.0)
MCHC: 32.5 g/dL (ref 30.0–36.0)
MCV: 94 fL (ref 80.0–100.0)
Platelets: 221 10*3/uL (ref 150–400)
RBC: 2.52 MIL/uL — ABNORMAL LOW (ref 3.87–5.11)
RDW: 15.7 % — ABNORMAL HIGH (ref 11.5–15.5)
WBC: 10.4 10*3/uL (ref 4.0–10.5)
nRBC: 0.2 % (ref 0.0–0.2)

## 2020-05-13 LAB — COMPREHENSIVE METABOLIC PANEL
ALT: 25 U/L (ref 0–44)
AST: 36 U/L (ref 15–41)
Albumin: 2.1 g/dL — ABNORMAL LOW (ref 3.5–5.0)
Alkaline Phosphatase: 72 U/L (ref 38–126)
Anion gap: 10 (ref 5–15)
BUN: 10 mg/dL (ref 6–20)
CO2: 21 mmol/L — ABNORMAL LOW (ref 22–32)
Calcium: 8.2 mg/dL — ABNORMAL LOW (ref 8.9–10.3)
Chloride: 104 mmol/L (ref 98–111)
Creatinine, Ser: 0.82 mg/dL (ref 0.44–1.00)
GFR, Estimated: >60 mL/min (ref >60)
Glucose, Bld: 133 mg/dL — ABNORMAL HIGH (ref 70–99)
Potassium: 4.1 mmol/L (ref 3.5–5.1)
Sodium: 135 mmol/L (ref 135–145)
Total Bilirubin: 0.6 mg/dL (ref 0.3–1.2)
Total Protein: 5.5 g/dL — ABNORMAL LOW (ref 6.5–8.1)

## 2020-05-13 MED ORDER — CLONAZEPAM 0.5 MG PO TABS: 0.2500 mg | ORAL_TABLET | Freq: Two times a day (BID) | ORAL | Status: DC

## 2020-05-13 MED ORDER — ONDANSETRON HCL 4 MG PO TABS
4.0000 mg | ORAL_TABLET | Freq: Once | ORAL | Status: AC
  Administered 2020-05-13: 12:00:00 4 mg via ORAL
  Filled 2020-05-13: qty 1

## 2020-05-13 MED ORDER — CLONAZEPAM 0.25 MG PO TBDP
0.2500 mg | ORAL_TABLET | Freq: Two times a day (BID) | ORAL | Status: DC
  Administered 2020-05-13 – 2020-05-18 (×9): 0.25 mg via ORAL
  Filled 2020-05-13 (×11): qty 1

## 2020-05-13 MED ORDER — DIAZEPAM 2 MG PO TABS
1.0000 mg | ORAL_TABLET | Freq: Every day | ORAL | Status: DC
  Administered 2020-05-13: 23:00:00 1 mg via ORAL
  Filled 2020-05-13: qty 1

## 2020-05-13 NOTE — Progress Notes (Signed)
PROGRESS NOTE    Maria [name]  ENI:[id number] DOB: 12-20-1960 DOA: 05/12/2020 PCP: Celene Squibb, MD  Outpatient Specialists:     Brief Narrative: As per H&P:  "Maria [name] is a 59 year old female with past medical history of intellectual disability, seizure disorder, chronic diastolic CHF, GERD, chronic constipation, seen by [facility] in consultation on 05/11/2020 for new onset of fever and abnormal UA. Further work-up revealedsevere sepsis due to ESBL E. coli bacteremia from UTI. Transferring patient from CIR to [facility] progressive care unit on 05/12/2020. Low threshold to consult PCCM and transfer to ICU if she worsens.  Summary: Patient was originally admitted 04/14/2020 to [facility], diagnosed with septic shock requiring vasopressors, presumed from urinary source, transferred to [facility] ICU. CT scanner at time of admission suspicious for pyelonephritis and small abscess in the lower pole of the left kidney. Follow-up ultrasound 11 days later showed stable lesion felt to represent a small perinephric abscess versus renal cyst. Blood cultures drawn after antibiotics were negative but urine culture grew E. coli-not ESBL. Completed 14-day course of ceftriaxone. Hospital course complicated by breakthrough seizures, delirium and agitation requiring Precedex drip. Urology did not feel the abscess required any further intervention. Subsequently transferred to CIR on 05/05/2020. She was doing well until 05/10/2020 when she developed fever, UA suggestive of UTI, urine culture grew E. coli, briefly on Keflex, TRH consulted 12/9, final cultures were not back, treated with aggressive IV fluids due to sepsis, elevated lactate and AKI and changed to IV ceftriaxone. BCID 12/10+ for gram-negative rods and final urine culture shows ESBL E. coli. Antibiotics changed to IV meropenem, ID consulted. Patient transferring to [facility] for close monitoring and management".  05/13/2020: Patient reports not  feeling 'same".  Patient looks weak, and slowed.  Will decrease Klonopin from 0.5 Mg p.o. twice daily to 0.25 Mg p.o. twice daily, and will also decrease Valium from 2 Mg p.o. nightly to 1 Mg p.o. nightly.  Patient continues to report nausea.  Very mild abdominal distention is not, query gaseous.  CT abdomen results reviewed.  Prior documentation noted.  Patient will continue IV antibiotics for now.  Assessment & Plan:   Principal Problem:   Severe sepsis ([place]) Active Problems:   GERD (gastroesophageal reflux disease)   Intellectual disability   Seizure disorder (HCC)   AKI (acute kidney injury) (HCC)   ESBL (extended spectrum beta-lactamase) producing bacteria infection   Bacteremia due to Escherichia coli   Severe sepsis secondary to ESBL E. coli bacteremia from UTI, concern for unresolved/recurrent renal abscess, with acute organ dysfunction (AKI, lactic acidosis, AMS):  Urine culture 05/10/2020: ESBL E. coli >100 K colonies per mL.  BCID: E. coli. Follow final culture sensitivity results, likely to be the same ESBL E. coli from UTI.  Repeat chest x-ray 05/12/2020: No acute findings.  Renal ultrasound 05/11/2020: Mild left-sided collecting system dilatation. Previously demonstrated hypoechoic area arising from the lower pole of the left kidney is not well appreciated on this study.  S/p aggressive IV fluid bolus and maintenance IV fluids since 12/9, possibly got >4 L. Had cut down IVF to 10 mL/h due to new cough, some tachypnea, concern for pulmonary edema but chest x-ray negative, soft BP in the 90s, IV fluids back up to 50 mL/h.  Lactate peaked at 4.9, normalized now.  Procalcitonin 0.9 > 2.47  Leukocytosis better from 18 > 15.  S/p Keflex x1 on 12/9, IV ceftriaxone x1 on 12/9, IV meropenem 12/10 >  ID consulted, continue  meropenem, requested repeat CT abdomen and pelvis with contrast to reevaluate possible renal abscess versus infected cyst.  CT abdomen revealed left  pyelonephritis with mild involving left renal abscess (urology input is appreciated).   Acute kidney injury  Resolved.  Lactic acidosis  Last lactic acid was 1.7.    Seizure disorder  History of breakthrough seizures during prior hospitalization.  Seizure precautions and continue AEDs. Continue to monitor patient closely while on meropenem.   Chronic diastolic CHF  2D echo 40/81/4481: LVEF 60-65%.  Some concern this morning for developing pulmonary edema due to worsening cough, some tachypnea but apart from occasional basal crackles, clinically did not appear volume overloaded.  Chest x-ray without acute findings.  Monitor closely while on IV fluids.  Anemia, suspect chronic disease  Hemoglobin at baseline recently ranging in the 8-9 range. Hemoglobin dropped from 9.9 yesterday to 8.3 likely dilutional.  Follow CBC daily and transfuse if hemoglobin 7 g or less.  GERD:  Continue PPI.  Encephalopathy:  -Likely combined toxic and metabolic. -Continue treatment for pyelonephritis/ mild renal abscess.   -Acute kidney injury has resolved   DVT prophylaxis: Subcutaneous Lovenox Code Status: Full code Family Communication:  Disposition Plan: Likely back to CIR.   Consultants:   Urology  Procedures:   None  Antimicrobials:   IV meropenem.     Subjective: Patient reports not feeling 'same".  Patient looks weak, and slowed.  Nausea  Objective: Vitals:   05/13/20 0315 05/13/20 0454 05/13/20 0813 05/13/20 1000  BP: (!) 125/56  (!) 151/72 138/78  Pulse: 93  96 93  Resp: 20  17 19   Temp: 98.8 F (37.1 C)  98.3 F (36.8 C) 98.7 F (37.1 C)  TempSrc: Oral  Oral Oral  SpO2: 99%  99% 97%  Weight:  64.1 kg      Intake/Output Summary (Last 24 hours) at 05/13/2020 1155 Last data filed at 05/13/2020 0230 Gross per 24 hour  Intake 960 ml  Output --  Net 960 ml   Filed Weights   05/13/20 0454  Weight: 64.1 kg    Examination:  General exam:  Ill looking, but appears calm and comfortable.  Patient looks pale, will dry buccal mucosa. Respiratory system: Clear to auscultation.  Cardiovascular system: S1 & S2 heard. Gastrointestinal system: Mild gaseous distention of the abdomen, soft. No organomegaly or masses felt.  Central nervous system: Alert and oriented.  Slowed.  Patient moves all extremities.  Extremities: No leg edema.    Data Reviewed: I have personally reviewed following labs and imaging studies  CBC: Recent Labs  Lab 05/08/20 0725 05/10/20 2205 05/11/20 0309 05/11/20 1542 05/12/20 0403 05/13/20 0114  WBC 6.3 10.2 14.8* 18.4* 15.5* 10.4  NEUTROABS 3.8 8.1*  --  15.6*  --   --   HGB 9.2* 9.8* 8.9* 9.9* 8.3* 7.7*  HCT 29.7* 30.4* 29.3* 30.6* 25.8* 23.7*  MCV 96.1 93.3 95.8 93.9 94.2 94.0  PLT 371 337 301 300 233 856   Basic Metabolic Panel: Recent Labs  Lab 05/08/20 0725 05/11/20 1542 05/12/20 0403 05/13/20 0114  NA 138 134* 137 135  K 3.8 4.0 3.9 4.1  CL 99 95* 106 104  CO2 26 22 20* 21*  GLUCOSE 100* 153* 145* 133*  BUN 16 17 12 10   CREATININE 1.12* 1.42* 1.03* 0.82  CALCIUM 9.7 9.5 7.9* 8.2*   GFR: Estimated Creatinine Clearance: 70 mL/min (by C-G formula based on SCr of 0.82 mg/dL). Liver Function Tests: Recent Labs  Lab 05/08/20  0725 05/12/20 0403 05/13/20 0114  AST 29 17 36  ALT 42 19 25  ALKPHOS 111 76 72  BILITOT 0.6 0.4 0.6  PROT 7.2 5.6* 5.5*  ALBUMIN 2.8* 2.2* 2.1*   No results for input(s): LIPASE, AMYLASE in the last 168 hours. No results for input(s): AMMONIA in the last 168 hours. Coagulation Profile: No results for input(s): INR, PROTIME in the last 168 hours. Cardiac Enzymes: No results for input(s): CKTOTAL, CKMB, CKMBINDEX, TROPONINI in the last 168 hours. BNP (last 3 results) No results for input(s): PROBNP in the last 8760 hours. HbA1C: No results for input(s): HGBA1C in the last 72 hours. CBG: Recent Labs  Lab 05/11/20 1635 05/11/20 2107 05/12/20 0551  05/12/20 1139 05/12/20 1703  GLUCAP 155* 113* 120* 111* 94   Lipid Profile: No results for input(s): CHOL, HDL, LDLCALC, TRIG, CHOLHDL, LDLDIRECT in the last 72 hours. Thyroid Function Tests: No results for input(s): TSH, T4TOTAL, FREET4, T3FREE, THYROIDAB in the last 72 hours. Anemia Panel: No results for input(s): VITAMINB12, FOLATE, FERRITIN, TIBC, IRON, RETICCTPCT in the last 72 hours. Urine analysis:    Component Value Date/Time   COLORURINE YELLOW 05/10/2020 1739   APPEARANCEUR CLOUDY (A) 05/10/2020 1739   LABSPEC 1.011 05/10/2020 1739   PHURINE 6.0 05/10/2020 1739   GLUCOSEU NEGATIVE 05/10/2020 1739   HGBUR MODERATE (A) 05/10/2020 1739   BILIRUBINUR NEGATIVE 05/10/2020 1739   KETONESUR NEGATIVE 05/10/2020 1739   PROTEINUR 100 (A) 05/10/2020 1739   UROBILINOGEN 0.2 10/26/2014 2340   NITRITE POSITIVE (A) 05/10/2020 1739   LEUKOCYTESUR LARGE (A) 05/10/2020 1739   Sepsis Labs: @LABRCNTIP (procalcitonin:4,lacticidven:4)  ) Recent Results (from the past 240 hour(s))  Culture, Urine     Status: Abnormal   Collection Time: 05/10/20  5:39 PM   Specimen: Urine, Clean Catch  Result Value Ref Range Status   Specimen Description URINE, CLEAN CATCH  Final   Special Requests   Final    NONE Performed at [facility], [place] [address] [place], [place] [address]    Culture (A)  Final    >=100,000 COLONIES/mL ESCHERICHIA COLI Confirmed Extended Spectrum Beta-Lactamase Producer (ESBL).  In bloodstream infections from ESBL organisms, carbapenems are preferred over piperacillin/tazobactam. They are shown to have a lower risk of mortality.    Report Status 05/12/2020 FINAL  Final   Organism ID, Bacteria ESCHERICHIA COLI (A)  Final      Susceptibility   Escherichia coli - MIC*    AMPICILLIN >=32 RESISTANT Resistant     CEFAZOLIN >=64 RESISTANT Resistant     CEFEPIME 16 RESISTANT Resistant     CEFTRIAXONE >=64 RESISTANT Resistant     CIPROFLOXACIN >=4 RESISTANT Resistant      GENTAMICIN >=16 RESISTANT Resistant     IMIPENEM <=0.25 SENSITIVE Sensitive     NITROFURANTOIN 128 RESISTANT Resistant     TRIMETH/SULFA >=320 RESISTANT Resistant     AMPICILLIN/SULBACTAM >=32 RESISTANT Resistant     PIP/TAZO 16 SENSITIVE Sensitive     * >=100,000 COLONIES/mL ESCHERICHIA COLI  Culture, blood (routine x 2)     Status: Abnormal (Preliminary result)   Collection Time: 05/11/20  5:37 PM   Specimen: BLOOD RIGHT HAND  Result Value Ref Range Status   Specimen Description BLOOD RIGHT HAND  Final   Special Requests   Final    BOTTLES DRAWN AEROBIC AND ANAEROBIC Blood Culture adequate volume   Culture  Setup Time   Final    GRAM NEGATIVE RODS IN BOTH AEROBIC AND ANAEROBIC  BOTTLES CRITICAL RESULT CALLED TO, READ BACK BY AND VERIFIED WITH: PHARMD JEREMY F. [phone] FCP    Culture (A)  Final    ESCHERICHIA COLI SUSCEPTIBILITIES TO FOLLOW Performed at [facility], [place] [address] [place], [place] [address]    Report Status PENDING  Incomplete  Blood Culture ID Panel (Reflexed)     Status: Abnormal   Collection Time: 05/11/20  5:37 PM  Result Value Ref Range Status   Enterococcus faecalis NOT DETECTED NOT DETECTED Final   Enterococcus Faecium NOT DETECTED NOT DETECTED Final   Listeria monocytogenes NOT DETECTED NOT DETECTED Final   Staphylococcus species NOT DETECTED NOT DETECTED Final   Staphylococcus aureus (BCID) NOT DETECTED NOT DETECTED Final   Staphylococcus epidermidis NOT DETECTED NOT DETECTED Final   Staphylococcus lugdunensis NOT DETECTED NOT DETECTED Final   Streptococcus species NOT DETECTED NOT DETECTED Final   Streptococcus agalactiae NOT DETECTED NOT DETECTED Final   Streptococcus pneumoniae NOT DETECTED NOT DETECTED Final   Streptococcus pyogenes NOT DETECTED NOT DETECTED Final   A.calcoaceticus-baumannii NOT DETECTED NOT DETECTED Final   Bacteroides fragilis NOT DETECTED NOT DETECTED Final   Enterobacterales DETECTED (A) NOT DETECTED Final     Comment: Enterobacterales represent a large order of gram negative bacteria, not a single organism. CRITICAL RESULT CALLED TO, READ BACK BY AND VERIFIED WITH: PHARMD JEREMY F. [phone] FCP    Enterobacter cloacae complex NOT DETECTED NOT DETECTED Final   Escherichia coli DETECTED (A) NOT DETECTED Final    Comment: CRITICAL RESULT CALLED TO, READ BACK BY AND VERIFIED WITH: PHARMD JEREMY F. [phone] FCP    Klebsiella aerogenes NOT DETECTED NOT DETECTED Final   Klebsiella oxytoca NOT DETECTED NOT DETECTED Final   Klebsiella pneumoniae NOT DETECTED NOT DETECTED Final   Proteus species NOT DETECTED NOT DETECTED Final   Salmonella species NOT DETECTED NOT DETECTED Final   Serratia marcescens NOT DETECTED NOT DETECTED Final   Haemophilus influenzae NOT DETECTED NOT DETECTED Final   Neisseria meningitidis NOT DETECTED NOT DETECTED Final   Pseudomonas aeruginosa NOT DETECTED NOT DETECTED Final   Stenotrophomonas maltophilia NOT DETECTED NOT DETECTED Final   Candida albicans NOT DETECTED NOT DETECTED Final   Candida auris NOT DETECTED NOT DETECTED Final   Candida glabrata NOT DETECTED NOT DETECTED Final   Candida krusei NOT DETECTED NOT DETECTED Final   Candida parapsilosis NOT DETECTED NOT DETECTED Final   Candida tropicalis NOT DETECTED NOT DETECTED Final   Cryptococcus neoformans/gattii NOT DETECTED NOT DETECTED Final   CTX-M ESBL DETECTED (A) NOT DETECTED Final    Comment: CRITICAL RESULT CALLED TO, READ BACK BY AND VERIFIED WITH: PHARMD JEREMY F. [phone] FCP (NOTE) Extended spectrum beta-lactamase detected. Recommend a carbapenem as initial therapy.      Carbapenem resistance IMP NOT DETECTED NOT DETECTED Final   Carbapenem resistance KPC NOT DETECTED NOT DETECTED Final   Carbapenem resistance NDM NOT DETECTED NOT DETECTED Final   Carbapenem resist OXA 48 LIKE NOT DETECTED NOT DETECTED Final   Carbapenem resistance VIM NOT DETECTED NOT DETECTED Final    Comment: Performed  at [facility], [place] [address], [place], [place] [address]  Culture, blood (routine x 2)     Status: None (Preliminary result)   Collection Time: 05/11/20  5:51 PM   Specimen: BLOOD  Result Value Ref Range Status   Specimen Description BLOOD RIGHT ANTECUBITAL  Final   Special Requests   Final    BOTTLES DRAWN AEROBIC AND  ANAEROBIC Blood Culture adequate volume   Culture  Setup Time   Final    GRAM NEGATIVE RODS AEROBIC BOTTLE ONLY CRITICAL VALUE NOTED.  VALUE IS CONSISTENT WITH PREVIOUSLY REPORTED AND CALLED VALUE.    Culture   Final    GRAM NEGATIVE RODS IDENTIFICATION TO FOLLOW Performed at [facility], [place] [address] [place], [place] [address]    Report Status PENDING  Incomplete  Resp panel by RT-PCR (RSV, Flu A&B, Covid) Nasopharyngeal Swab     Status: None   Collection Time: 05/12/20  6:06 PM   Specimen: Nasopharyngeal Swab; Nasopharyngeal(NP) swabs in vial transport medium  Result Value Ref Range Status   SARS Coronavirus 2 by RT PCR NEGATIVE NEGATIVE Final    Comment: (NOTE) SARS-CoV-2 target nucleic acids are NOT DETECTED.  The SARS-CoV-2 RNA is generally detectable in upper respiratory specimens during the acute phase of infection. The lowest concentration of SARS-CoV-2 viral copies this assay can detect is 138 copies/mL. A negative result does not preclude SARS-Cov-2 infection and should not be used as the sole basis for treatment or other patient management decisions. A negative result may occur with  improper specimen collection/handling, submission of specimen other than nasopharyngeal swab, presence of viral mutation(s) within the areas targeted by this assay, and inadequate number of viral copies(<138 copies/mL). A negative result must be combined with clinical observations, patient history, and epidemiological information. The expected result is Negative.  Fact Sheet for Patients:  EntrepreneurPulse.com.au  Fact Sheet for  Healthcare Providers:  IncredibleEmployment.be  This test is no t yet approved or cleared by the [place] FDA and  has been authorized for detection and/or diagnosis of SARS-CoV-2 by FDA under an Emergency Use Authorization (EUA). This EUA will remain  in effect (meaning this test can be used) for the duration of the COVID-19 declaration under Section 564(b)(1) of the Act, 21 U.S.C.section 360bbb-3(b)(1), unless the authorization is terminated  or revoked sooner.       Influenza A by PCR NEGATIVE NEGATIVE Final   Influenza B by PCR NEGATIVE NEGATIVE Final    Comment: (NOTE) The Xpert Xpress SARS-CoV-2/FLU/RSV plus assay is intended as an aid in the diagnosis of influenza from Nasopharyngeal swab specimens and should not be used as a sole basis for treatment. Nasal washings and aspirates are unacceptable for Xpert Xpress SARS-CoV-2/FLU/RSV testing.  Fact Sheet for Patients: EntrepreneurPulse.com.au  Fact Sheet for Healthcare Providers: IncredibleEmployment.be  This test is not yet approved or cleared by the [place] FDA and has been authorized for detection and/or diagnosis of SARS-CoV-2 by FDA under an Emergency Use Authorization (EUA). This EUA will remain in effect (meaning this test can be used) for the duration of the COVID-19 declaration under Section 564(b)(1) of the Act, 21 U.S.C. section 360bbb-3(b)(1), unless the authorization is terminated or revoked.     Resp Syncytial Virus by PCR NEGATIVE NEGATIVE Final    Comment: (NOTE) Fact Sheet for Patients: EntrepreneurPulse.com.au  Fact Sheet for Healthcare Providers: IncredibleEmployment.be  This test is not yet approved or cleared by the [place] FDA and has been authorized for detection and/or diagnosis of SARS-CoV-2 by FDA under an Emergency Use Authorization (EUA). This EUA will remain in effect (meaning this  test can be used) for the duration of the COVID-19 declaration under Section 564(b)(1) of the Act, 21 U.S.C. section 360bbb-3(b)(1), unless the authorization is terminated or revoked.  Performed at [facility], [place] [address], [place], [place] [address]  Radiology Studies: CT ABDOMEN PELVIS W CONTRAST  Result Date: 05/12/2020 CLINICAL DATA:  Follow-up possible renal abscess EXAM: CT ABDOMEN AND PELVIS WITH CONTRAST TECHNIQUE: Multidetector CT imaging of the abdomen and pelvis was performed using the standard protocol following bolus administration of intravenous contrast. CONTRAST:  37mL OMNIPAQUE IOHEXOL 300 MG/ML  SOLN COMPARISON:  04/14/2020 FINDINGS: Lower chest: Bibasilar dependent atelectasis. Heart is borderline in size. Hepatobiliary: No focal hepatic abnormality. Gallbladder unremarkable. Pancreas: No focal abnormality or ductal dilatation. Spleen: No focal abnormality.  Normal size. Adrenals/Urinary Tract: Areas of decreased renal perfusion within the upper and mid poles of the left kidney, new since prior study concerning for pyelonephritis. New focal small low-density lesion in the midpole of the left kidney measures 12 mm and could reflect a small developing abscess. The previously seen fluid collection in the lower pole of the left kidney has resolved. There is perinephric stranding most notable around the lower pole. No hydronephrosis. No focal abnormality on the right. Adrenal glands and urinary bladder unremarkable. Stomach/Bowel: Stomach, large and small bowel grossly unremarkable. Normal appendix Vascular/Lymphatic: Aortoiliac atherosclerosis. No evidence of aneurysm or adenopathy. Reproductive: Prior hysterectomy.  No adnexal masses. Other: No free fluid or free air. Musculoskeletal: No acute bony abnormality. IMPRESSION: Previously seen fluid collection in the lower pole of the left kidney has resolved. There continues to be stranding around the lower pole of the  left kidney. In addition, there are new areas of decreased perfusion and striated nephrograms in the mid and upper pole of the left kidney concerning for pyelonephritis. New small low-density fluid collection in the midpole measures 12 mm and is concerning for new small developing abscess. Aortoiliac atherosclerosis. Dependent bibasilar atelectasis. Electronically Signed   By: Rolm Baptise M.D.   On: 05/12/2020 22:30   US RENAL  Result Date: 05/11/2020 CLINICAL DATA:  Acute kidney injury EXAM: RENAL / URINARY TRACT ULTRASOUND COMPLETE COMPARISON:  Ultrasound renal dated April 25, 2020 FINDINGS: Right Kidney: Renal measurements: 11 x 5.1 x 5.2 cm = volume: 153 mL. There is no hydronephrosis. The cortical echogenicity is slightly increased. Left Kidney: Renal measurements: 9.7 x 5.2 x 3.8 cm = volume: 101 mL. The cortical echogenicity is slightly increased. There is mild left-sided collecting system dilatation. The previously demonstrated hypoechoic lesion involving the left kidney is no longer well visualized. Bladder: Appears normal for degree of bladder distention. Other: None. IMPRESSION: 1. Echogenic kidneys bilaterally which can be seen in patients with medical renal disease. 2. Atrophic left kidney, similar to prior study. 3. Mild left-sided collecting system dilatation. 4. Previously demonstrated hypoechoic area arising from the lower pole the left kidney is not well appreciated on today's study. Electronically Signed   By: Constance Holster M.D.   On: 05/11/2020 22:27   DG CHEST PORT 1 VIEW  Result Date: 05/12/2020 CLINICAL DATA:  Shortness of breath, cough. EXAM: PORTABLE CHEST 1 VIEW COMPARISON:  December 9, 21. FINDINGS: The heart size and mediastinal contours are within normal limits. Both lungs are clear. No visible pleural effusions or pneumothorax. Biapical pleuroparenchymal scarring. No acute osseous abnormality. IMPRESSION: No evidence of acute cardiopulmonary disease. Electronically  Signed   By: Margaretha Sheffield MD   On: 05/12/2020 09:53   DG Chest Port 1 View  Result Date: 05/11/2020 CLINICAL DATA:  Acute kidney injury EXAM: PORTABLE CHEST 1 VIEW COMPARISON:  May 08, 2020 FINDINGS: The heart size is stable. The lung volumes are low. There are probable small bilateral pleural effusions with adjacent atelectasis, left  worse than right. There is no pneumothorax, however evaluation of the right lung apex is limited secondary to overlapping osseous structures. There is no acute osseous abnormality. IMPRESSION: Low lung volumes with probable small bilateral pleural effusions, left greater than right. No definite focal infiltrate. Electronically Signed   By: Constance Holster M.D.   On: 05/11/2020 20:02        Scheduled Meds: . atorvastatin  20 mg Oral QPM  . benzonatate  200 mg Oral TID  . chlorhexidine  15 mL Mouth Rinse BID  . clonazePAM  0.25 mg Oral BID  . diazepam  1 mg Oral QHS  . docusate sodium  100 mg Oral BID  . enoxaparin (LOVENOX) injection  40 mg Subcutaneous Q24H  . feeding supplement  237 mL Oral TID BM  . mouth rinse  15 mL Mouth Rinse q12n4p  . metoprolol tartrate  25 mg Oral BID  . mometasone-formoterol  2 puff Inhalation BID  . multivitamin with minerals  1 tablet Oral Daily  . ondansetron  4 mg Oral Once  . pantoprazole (PROTONIX) IV  40 mg Intravenous Q24H  . QUEtiapine  25 mg Oral Daily  . QUEtiapine  75 mg Oral QHS  . sodium chloride flush  3 mL Intravenous Q12H   Continuous Infusions: . lacosamide (VIMPAT) IV 200 mg (05/12/20 2234)  . meropenem (MERREM) IV 1 g (05/13/20 0504)  . valproate sodium 500 mg (05/13/20 0006)     LOS: 1 day    Time spent: 35 minutes    Dana Allan, MD  [facility] Pager #: [phone] 7PM-7AM contact night coverage as above

## 2020-05-13 NOTE — Progress Notes (Signed)
[facility] [facility]  Date of Admission:  05/12/2020       Abx: 12/10-c meropenem   ASSESSMENT: 59 yo female hx epilepsi, recent left pyelo/septic shock 04/14/2020 s/p 14 days ceftriaxone, now with recurrent ecoli bacteremia and ct showing evolving abscesses left kidney, and now ecoli is esbl  12/09 bcx ecoli 12/10 ct scan resolved prior abscess with new area of developing abscess Patient appears to be doing better since abx adjusted 12/10  Will need at least 4 weeks treatment if not longer   PLAN: 1. Will follow up lab to test susceptibility of ertapenem which is a once daily abx for ease of administration 2. Continue meropenem for now 3.   No need to repeat blood culture unless decompensating  Principal Problem:   Severe sepsis (HCC) Active Problems:   GERD (gastroesophageal reflux disease)   Intellectual disability   Seizure disorder (HCC)   AKI (acute kidney injury) (HCC)   ESBL (extended spectrum beta-lactamase) producing bacteria infection   Bacteremia due to Escherichia coli   Scheduled Meds:  atorvastatin  20 mg Oral QPM   benzonatate  200 mg Oral TID   chlorhexidine  15 mL Mouth Rinse BID   clonazepam  0.25 mg Oral BID   diazepam  1 mg Oral QHS   docusate sodium  100 mg Oral BID   enoxaparin (LOVENOX) injection  40 mg Subcutaneous Q24H   feeding supplement  237 mL Oral TID BM   mouth rinse  15 mL Mouth Rinse q12n4p   metoprolol tartrate  25 mg Oral BID   mometasone-formoterol  2 puff Inhalation BID   multivitamin with minerals  1 tablet Oral Daily   pantoprazole (PROTONIX) IV  40 mg Intravenous Q24H   QUEtiapine  25 mg Oral Daily   QUEtiapine  75 mg Oral QHS   sodium chloride flush  3 mL Intravenous Q12H   Continuous Infusions:  lacosamide (VIMPAT) IV Stopped (05/13/20 1255)   meropenem (MERREM) IV Stopped (05/13/20 1451)   valproate sodium 55 mL/hr at 05/13/20 1500   PRN Meds:.acetaminophen,  acetaminophen, alum & mag hydroxide-simeth, bisacodyl, guaiFENesin-dextromethorphan, ipratropium-albuterol, lip balm, LORazepam, melatonin, phenol, polyethylene glycol   SUBJECTIVE: No further seizure today Nausea/vomiting  No diarrhea No rash Worn out  Ct scan showed evolving abscesses left kidney Afebrile today   Review of Systems: ROS Negative 11 point ros unless mentioned above  Allergies  Allergen Reactions   Codeine Nausea And Vomiting   Lamictal [Lamotrigine] Rash    OBJECTIVE: Vitals:   05/13/20 0454 05/13/20 0813 05/13/20 1000 05/13/20 1201  BP:  (!) 151/72 138/78 (!) 151/76  Pulse:  96 93 89  Resp:  17 19 18   Temp:  98.3 F (36.8 C) 98.7 F (37.1 C) 98.8 F (37.1 C)  TempSrc:  Oral Oral Oral  SpO2:  99% 97% 100%  Weight: 64.1 kg      Body mass index is 22.81 kg/m.  Physical Exam Lying in bed, appeared very tired, sleeping; arousable but no much conversant Sister by bedside does report patient looks better than past 24 hours heent normocephalic; per; conj clear abd s/nt Ext no edema Skin no rash Neuro weak diffusely; strength symmetric Psych sleeping, oriented to self/people  Lab Results Lab Results  Component Value Date   WBC 10.4 05/13/2020   HGB 7.7 (L) 05/13/2020   HCT 23.7 (L) 05/13/2020   MCV 94.0 05/13/2020   PLT 221 05/13/2020    Lab  Results  Component Value Date   CREATININE 0.82 05/13/2020   BUN 10 05/13/2020   NA 135 05/13/2020   K 4.1 05/13/2020   CL 104 05/13/2020   CO2 21 (L) 05/13/2020    Lab Results  Component Value Date   ALT 25 05/13/2020   AST 36 05/13/2020   ALKPHOS 72 05/13/2020   BILITOT 0.6 05/13/2020     Microbiology: Recent Results (from the past 240 hour(s))  Culture, Urine     Status: Abnormal   Collection Time: 05/10/20  5:39 PM   Specimen: Urine, Clean Catch  Result Value Ref Range Status   Specimen Description URINE, CLEAN CATCH  Final   Special Requests   Final    NONE Performed at [facility], [place] [address], [place], [place] [address]    Culture (A)  Final    >=100,000 COLONIES/mL ESCHERICHIA COLI Confirmed Extended Spectrum Beta-Lactamase Producer (ESBL).  In bloodstream infections from ESBL organisms, carbapenems are preferred over piperacillin/tazobactam. They are shown to have a lower risk of mortality.    Report Status 05/12/2020 FINAL  Final   Organism ID, Bacteria ESCHERICHIA COLI (A)  Final      Susceptibility   Escherichia coli - MIC*    AMPICILLIN >=32 RESISTANT Resistant     CEFAZOLIN >=64 RESISTANT Resistant     CEFEPIME 16 RESISTANT Resistant     CEFTRIAXONE >=64 RESISTANT Resistant     CIPROFLOXACIN >=4 RESISTANT Resistant     GENTAMICIN >=16 RESISTANT Resistant     IMIPENEM <=0.25 SENSITIVE Sensitive     NITROFURANTOIN 128 RESISTANT Resistant     TRIMETH/SULFA >=320 RESISTANT Resistant     AMPICILLIN/SULBACTAM >=32 RESISTANT Resistant     PIP/TAZO 16 SENSITIVE Sensitive     * >=100,000 COLONIES/mL ESCHERICHIA COLI  Culture, blood (routine x 2)     Status: Abnormal (Preliminary result)   Collection Time: 05/11/20  5:37 PM   Specimen: BLOOD RIGHT HAND  Result Value Ref Range Status   Specimen Description BLOOD RIGHT HAND  Final   Special Requests   Final    BOTTLES DRAWN AEROBIC AND ANAEROBIC Blood Culture adequate volume   Culture  Setup Time   Final    GRAM NEGATIVE RODS IN BOTH AEROBIC AND ANAEROBIC BOTTLES CRITICAL RESULT CALLED TO, READ BACK BY AND VERIFIED WITH: PHARMD JEREMY F. [phone] FCP    Culture (A)  Final    ESCHERICHIA COLI SUSCEPTIBILITIES TO FOLLOW Performed at [facility], [address] [place], [place] [address]    Report Status PENDING  Incomplete  Blood Culture ID Panel (Reflexed)     Status: Abnormal   Collection Time: 05/11/20  5:37 PM  Result Value Ref Range Status   Enterococcus faecalis NOT DETECTED NOT DETECTED Final   Enterococcus Faecium NOT DETECTED NOT DETECTED Final   Listeria monocytogenes  NOT DETECTED NOT DETECTED Final   Staphylococcus species NOT DETECTED NOT DETECTED Final   Staphylococcus aureus (BCID) NOT DETECTED NOT DETECTED Final   Staphylococcus epidermidis NOT DETECTED NOT DETECTED Final   Staphylococcus lugdunensis NOT DETECTED NOT DETECTED Final   Streptococcus species NOT DETECTED NOT DETECTED Final   Streptococcus agalactiae NOT DETECTED NOT DETECTED Final   Streptococcus pneumoniae NOT DETECTED NOT DETECTED Final   Streptococcus pyogenes NOT DETECTED NOT DETECTED Final   A.calcoaceticus-baumannii NOT DETECTED NOT DETECTED Final   Bacteroides fragilis NOT DETECTED NOT DETECTED Final   Enterobacterales DETECTED (A) NOT DETECTED Final    Comment:  Enterobacterales represent a large order of gram negative bacteria, not a single organism. CRITICAL RESULT CALLED TO, READ BACK BY AND VERIFIED WITH: PHARMD JEREMY F. [id number] FCP    Enterobacter cloacae complex NOT DETECTED NOT DETECTED Final   Escherichia coli DETECTED (A) NOT DETECTED Final    Comment: CRITICAL RESULT CALLED TO, READ BACK BY AND VERIFIED WITH: PHARMD JEREMY F. [phone] FCP    Klebsiella aerogenes NOT DETECTED NOT DETECTED Final   Klebsiella oxytoca NOT DETECTED NOT DETECTED Final   Klebsiella pneumoniae NOT DETECTED NOT DETECTED Final   Proteus species NOT DETECTED NOT DETECTED Final   Salmonella species NOT DETECTED NOT DETECTED Final   Serratia marcescens NOT DETECTED NOT DETECTED Final   Haemophilus influenzae NOT DETECTED NOT DETECTED Final   Neisseria meningitidis NOT DETECTED NOT DETECTED Final   Pseudomonas aeruginosa NOT DETECTED NOT DETECTED Final   Stenotrophomonas maltophilia NOT DETECTED NOT DETECTED Final   Candida albicans NOT DETECTED NOT DETECTED Final   Candida auris NOT DETECTED NOT DETECTED Final   Candida glabrata NOT DETECTED NOT DETECTED Final   Candida krusei NOT DETECTED NOT DETECTED Final   Candida parapsilosis NOT DETECTED NOT DETECTED Final   Candida  tropicalis NOT DETECTED NOT DETECTED Final   Cryptococcus neoformans/gattii NOT DETECTED NOT DETECTED Final   CTX-M ESBL DETECTED (A) NOT DETECTED Final    Comment: CRITICAL RESULT CALLED TO, READ BACK BY AND VERIFIED WITH: PHARMD JEREMY F. [id number] FCP (NOTE) Extended spectrum beta-lactamase detected. Recommend a carbapenem as initial therapy.      Carbapenem resistance IMP NOT DETECTED NOT DETECTED Final   Carbapenem resistance KPC NOT DETECTED NOT DETECTED Final   Carbapenem resistance NDM NOT DETECTED NOT DETECTED Final   Carbapenem resist OXA 48 LIKE NOT DETECTED NOT DETECTED Final   Carbapenem resistance VIM NOT DETECTED NOT DETECTED Final    Comment: Performed at [facility], [place] [address], [place], [place] [address]  Culture, blood (routine x 2)     Status: None (Preliminary result)   Collection Time: 05/11/20  5:51 PM   Specimen: BLOOD  Result Value Ref Range Status   Specimen Description BLOOD RIGHT ANTECUBITAL  Final   Special Requests   Final    BOTTLES DRAWN AEROBIC AND ANAEROBIC Blood Culture adequate volume   Culture  Setup Time   Final    GRAM NEGATIVE RODS AEROBIC BOTTLE ONLY CRITICAL VALUE NOTED.  VALUE IS CONSISTENT WITH PREVIOUSLY REPORTED AND CALLED VALUE.    Culture   Final    GRAM NEGATIVE RODS IDENTIFICATION TO FOLLOW Performed at [facility], [place] [address], [place], [place] [address]    Report Status PENDING  Incomplete  Resp panel by RT-PCR (RSV, Flu A&B, Covid) Nasopharyngeal Swab     Status: None   Collection Time: 05/12/20  6:06 PM   Specimen: Nasopharyngeal Swab; Nasopharyngeal(NP) swabs in vial transport medium  Result Value Ref Range Status   SARS Coronavirus 2 by RT PCR NEGATIVE NEGATIVE Final    Comment: (NOTE) SARS-CoV-2 target nucleic acids are NOT DETECTED.  The SARS-CoV-2 RNA is generally detectable in upper respiratory specimens during the acute phase of infection. The lowest concentration of SARS-CoV-2 viral  copies this assay can detect is 138 copies/mL. A negative result does not preclude SARS-Cov-2 infection and should not be used as the sole basis for treatment or other patient management decisions. A negative result may occur with  improper specimen collection/handling, submission of specimen other than  nasopharyngeal swab, presence of viral mutation(s) within the areas targeted by this assay, and inadequate number of viral copies(<138 copies/mL). A negative result must be combined with clinical observations, patient history, and epidemiological information. The expected result is Negative.  Fact Sheet for Patients:  EntrepreneurPulse.com.au  Fact Sheet for Healthcare Providers:  IncredibleEmployment.be  This test is no t yet approved or cleared by the [place] FDA and  has been authorized for detection and/or diagnosis of SARS-CoV-2 by FDA under an Emergency Use Authorization (EUA). This EUA will remain  in effect (meaning this test can be used) for the duration of the COVID-19 declaration under Section 564(b)(1) of the Act, 21 U.S.C.section 360bbb-3(b)(1), unless the authorization is terminated  or revoked sooner.       Influenza A by PCR NEGATIVE NEGATIVE Final   Influenza B by PCR NEGATIVE NEGATIVE Final    Comment: (NOTE) The Xpert Xpress SARS-CoV-2/FLU/RSV plus assay is intended as an aid in the diagnosis of influenza from Nasopharyngeal swab specimens and should not be used as a sole basis for treatment. Nasal washings and aspirates are unacceptable for Xpert Xpress SARS-CoV-2/FLU/RSV testing.  Fact Sheet for Patients: EntrepreneurPulse.com.au  Fact Sheet for Healthcare Providers: IncredibleEmployment.be  This test is not yet approved or cleared by the [place] FDA and has been authorized for detection and/or diagnosis of SARS-CoV-2 by FDA under an Emergency Use Authorization (EUA). This  EUA will remain in effect (meaning this test can be used) for the duration of the COVID-19 declaration under Section 564(b)(1) of the Act, 21 U.S.C. section 360bbb-3(b)(1), unless the authorization is terminated or revoked.     Resp Syncytial Virus by PCR NEGATIVE NEGATIVE Final    Comment: (NOTE) Fact Sheet for Patients: EntrepreneurPulse.com.au  Fact Sheet for Healthcare Providers: IncredibleEmployment.be  This test is not yet approved or cleared by the [place] FDA and has been authorized for detection and/or diagnosis of SARS-CoV-2 by FDA under an Emergency Use Authorization (EUA). This EUA will remain in effect (meaning this test can be used) for the duration of the COVID-19 declaration under Section 564(b)(1) of the Act, 21 U.S.C. section 360bbb-3(b)(1), unless the authorization is terminated or revoked.  Performed at [facility], [place] [address] [place], [place] [address]    Imaging: 12/10 abd pelv ct with contrast Previously seen fluid collection in the lower pole of the left kidney has resolved. There continues to be stranding around the lower pole of the left kidney. In addition, there are new areas of decreased perfusion and striated nephrograms in the mid and upper pole of the left kidney concerning for pyelonephritis. New small low-density fluid collection in the midpole measures 12 mm and is concerning for new small developing abscess.  Aortoiliac atherosclerosis.  Dependent bibasilar atelectasis.  Jabier Mutton, [place] [facility] [place] [phone] pager    05/13/2020, 6:35 PM

## 2020-05-13 NOTE — Progress Notes (Signed)
Overnight floor coverage  CT abdomen pelvis done tonight showing: "IMPRESSION: Previously seen fluid collection in the lower pole of the left kidney has resolved. There continues to be stranding around the lower pole of the left kidney. In addition, there are new areas of decreased perfusion and striated nephrograms in the mid and upper pole of the left kidney concerning for pyelonephritis. New small low-density fluid collection in the midpole measures 12 mm and is concerning for new small developing abscess.  Aortoiliac atherosclerosis.  Dependent bibasilar atelectasis."  -Discussed with Dr. Claudia Desanctis from urology who reviewed the patient's CT scan and feels that the abscess is very small and not amenable to drainage by IR.  Recommending continuing antibiotics for now.  She will see the patient in consultation in the morning.  Appreciate recommendations from Dr. Claudia Desanctis. -Continue meropenem

## 2020-05-13 NOTE — Consult Note (Signed)
I have been asked to see the patient by Dr. Shela Leff, for evaluation and management of renal abscess.  History of present illness: 59 year old woman with multiple medical problems including seizure disorder, chronic diastolic CHF and intellectual disability now admitted to hospital for fever, UTI and findings concerning for evolving left renal abscess.  She has had a history of renal abscesses in the past.  Most recent imaging shows evolving 12 mm area but no distinct fluid collection that looks drainable on CT.  Urine culture reveals MDR E colic sensitive only to imipenem and zosyn.  Previous left renal abscess seen during admission in November 2021 has now resolved.  Review of systems: Unable to perform due to patient clinical/mental status  Patient Active Problem List   Diagnosis Date Noted  . ESBL (extended spectrum beta-lactamase) producing bacteria infection 05/12/2020  . Bacteremia due to Escherichia coli 05/12/2020  . Severe sepsis ([place]) 05/12/2020  . Debility 05/05/2020  . Pressure injury of skin 04/24/2020  . Palliative care by specialist   . Pyelonephritis of left kidney 04/14/2020  . Renal abscess 04/14/2020  . Leukocytosis 04/14/2020  . Fever 04/14/2020  . LLQ abdominal pain 04/14/2020  . Dysphagia   . AKI (acute kidney injury) ([place])   . Sepsis secondary to UTI ([place])   . Pain in left ankle and joints of left foot 12/02/2019  . Positive colorectal cancer screening using Cologuard test 10/28/2018  . Posterior tibial tendinitis, right leg 04/23/2016  . Pain in right foot 04/12/2016  . Acute encephalopathy   . Myoclonus   . Frequent falls 11/09/2015  . Fall 11/09/2015  . Chest pain 03/16/2014  . Upper abdominal pain 03/16/2014  . Seizure disorder ([place]) 03/16/2014  . 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 Generalized convulsive epilepsy ([place]) 10/06/2012  . DNR (do not resuscitate) discussion 10/06/2012  . Intellectual disability   . Thrombocytopenia ([place])  05/23/2011  . Seizures ([place]) 03/21/2011  . GERD (gastroesophageal reflux disease) 03/21/2011  . CLOSED FRACTURE OF ACROMIAL END OF CLAVICLE 07/13/2008    No current facility-administered medications on file prior to encounter.   Current Outpatient Medications on File Prior to Encounter  Medication Sig Dispense Refill  . acetaminophen (TYLENOL) 500 MG tablet Take 1 tablet (500 mg total) by mouth every 6 (six) hours as needed for moderate pain. 30 tablet 0  . atorvastatin (LIPITOR) 20 MG tablet Take 20 mg by mouth daily.    . clonazePAM (KLONOPIN) 1 MG tablet Take 1 tablet (1 mg total) by mouth 3 (three) times daily. 30 tablet 0  . diazepam (VALIUM) 2 MG tablet Take 1 tablet (2 mg total) by mouth at bedtime. 30 tablet 0  . divalproex (DEPAKOTE ER) 500 MG 24 hr tablet Take 1 tablet (500 mg total) by mouth 2 (two) times daily. 180 tablet 3  . docusate sodium (COLACE) 100 MG capsule Take 1 capsule (100 mg total) by mouth 2 (two) times daily. 10 capsule 0  . feeding supplement (ENSURE ENLIVE / ENSURE PLUS) LIQD Take 237 mLs by mouth 3 (three) times daily between meals. 237 mL 12  . ipratropium-albuterol (DUONEB) 0.5-2.5 (3) MG/3ML SOLN Take 3 mLs by nebulization every 4 (four) hours as needed. 360 mL   . lacosamide (VIMPAT) 200 MG TABS tablet Take 1 tablet (200 mg total) by mouth 2 (two) times daily. 180 tablet 1  . metoprolol tartrate (LOPRESSOR) 25 MG tablet Take 1 tablet (25 mg total) by mouth 2 (two) times daily.    Marland Kitchen  omeprazole (PRILOSEC) 40 MG capsule Take 1 capsule (40 mg total) by mouth daily. 90 capsule 3  . oxybutynin (DITROPAN-XL) 5 MG 24 hr tablet Take 5 mg by mouth at bedtime.     . Pediatric Multiple Vit-C-FA (PEDIATRIC MULTIVITAMIN) chewable tablet Chew 1 tablet by mouth daily.      . QUEtiapine (SEROQUEL) 25 MG tablet Take 3 tablets (75 mg total) by mouth daily.    . QUEtiapine (SEROQUEL) 50 MG tablet Take 3 tablets (150 mg total) by mouth at bedtime.      Past Medical History:   Diagnosis Date  . Constipation   . Dysphagia   . Fracture    R foot  . GERD (gastroesophageal reflux disease)   . History of shingles 10/2016  . Mental retardation    lesion in head  . Seizures ([place])    "not fully controlled on max doses of meds" (Neuro ofc note 12/2014)  . Thrombocytopenia ([place])   . Tremor     Past Surgical History:  Procedure Laterality Date  . BIOPSY  09/16/2014   Procedure: BIOPSY;  Surgeon: Rogene Houston, MD;  Location: [facility];  Service: Endoscopy;;  . CATARACT EXTRACTION     both eyes, May of 2015  . COLONOSCOPY    . COLONOSCOPY WITH PROPOFOL N/A 11/20/2018   Procedure: COLONOSCOPY WITH PROPOFOL;  Surgeon: Rogene Houston, MD;  Location: [facility];  Service: Endoscopy;  Laterality: N/A;  . ESOPHAGEAL DILATION N/A 09/16/2014   Procedure: ESOPHAGEAL DILATION WITH 54FR MALONEY DILATOR;  Surgeon: Rogene Houston, MD;  Location: [facility];  Service: Endoscopy;  Laterality: N/A;  . ESOPHAGOGASTRODUODENOSCOPY (EGD) WITH PROPOFOL N/A 09/16/2014   Procedure: ESOPHAGOGASTRODUODENOSCOPY (EGD) WITH PROPOFOL;  Surgeon: Rogene Houston, MD;  Location: [facility];  Service: Endoscopy;  Laterality: N/A;  . MOUTH SURGERY    . POLYPECTOMY  11/20/2018   Procedure: POLYPECTOMY;  Surgeon: Rogene Houston, MD;  Location: [facility];  Service: Endoscopy;;  colon  . Skin graft to gum Right 08/2013  . TONSILLECTOMY AND ADENOIDECTOMY    . TOTAL ABDOMINAL HYSTERECTOMY      Social History   Tobacco Use  . Smoking status: Former Smoker    Packs/day: 1.50    Years: 15.00    Pack years: 22.50    Types: Cigarettes    Quit date: 05/22/2006    Years since quitting: 13.9  . Smokeless tobacco: Never Used  Vaping Use  . Vaping Use: Never used  Substance Use Topics  . Alcohol use: No    Alcohol/week: 0.0 standard drinks  . Drug use: No    Family History  Problem Relation Age of Onset  . High Cholesterol Mother   . High blood pressure Mother   . Diabetes Father      PE: Vitals:   05/12/20 2314 05/13/20 0111 05/13/20 0315 05/13/20 0454  BP: 132/72 136/69 (!) 125/56   Pulse: 94 91 93   Resp: (!) 26 19 20    Temp: 98.8 F (37.1 C) 100.2 F (37.9 C) 98.8 F (37.1 C)   TempSrc: Oral Oral Oral   SpO2: 99% 100% 99%   Weight:    64.1 kg   Patient appears to be in no acute distress  patient is sleeping Atraumatic normocephalic head No cervical or supraclavicular lymphadenopathy appreciated No increased work of breathing, no audible wheezes/rhonchi Regular sinus rhythm/rate Abdomen oft, nontender, nondistended, +LEFT CVA tenderness Lower extremities are symmetric without appreciable edema Grossly neurologically intact No  identifiable skin lesions  Recent Labs    05/11/20 1542 05/12/20 0403 05/13/20 0114  WBC 18.4* 15.5* 10.4  HGB 9.9* 8.3* 7.7*  HCT 30.6* 25.8* 23.7*   Recent Labs    05/11/20 1542 05/12/20 0403 05/13/20 0114  NA 134* 137 135  K 4.0 3.9 4.1  CL 95* 106 104  CO2 22 20* 21*  GLUCOSE 153* 145* 133*  BUN 17 12 10   CREATININE 1.42* 1.03* 0.82  CALCIUM 9.5 7.9* 8.2*   No results for input(s): LABPT, INR in the last 72 hours. No results for input(s): LABURIN in the last 72 hours. Results for orders placed or performed during the hospital encounter of 05/12/20  Resp panel by RT-PCR (RSV, Flu A&B, Covid) Nasopharyngeal Swab     Status: None   Collection Time: 05/12/20  6:06 PM   Specimen: Nasopharyngeal Swab; Nasopharyngeal(NP) swabs in vial transport medium  Result Value Ref Range Status   SARS Coronavirus 2 by RT PCR NEGATIVE NEGATIVE Final    Comment: (NOTE) SARS-CoV-2 target nucleic acids are NOT DETECTED.  The SARS-CoV-2 RNA is generally detectable in upper respiratory specimens during the acute phase of infection. The lowest concentration of SARS-CoV-2 viral copies this assay can detect is 138 copies/mL. A negative result does not preclude SARS-Cov-2 infection and should not be used as the sole basis for  treatment or other patient management decisions. A negative result may occur with  improper specimen collection/handling, submission of specimen other than nasopharyngeal swab, presence of viral mutation(s) within the areas targeted by this assay, and inadequate number of viral copies(<138 copies/mL). A negative result must be combined with clinical observations, patient history, and epidemiological information. The expected result is Negative.  Fact Sheet for Patients:  EntrepreneurPulse.com.au  Fact Sheet for Healthcare Providers:  IncredibleEmployment.be  This test is no t yet approved or cleared by the [place] FDA and  has been authorized for detection and/or diagnosis of SARS-CoV-2 by FDA under an Emergency Use Authorization (EUA). This EUA will remain  in effect (meaning this test can be used) for the duration of the COVID-19 declaration under Section 564(b)(1) of the Act, 21 U.S.C.section 360bbb-3(b)(1), unless the authorization is terminated  or revoked sooner.       Influenza A by PCR NEGATIVE NEGATIVE Final   Influenza B by PCR NEGATIVE NEGATIVE Final    Comment: (NOTE) The Xpert Xpress SARS-CoV-2/FLU/RSV plus assay is intended as an aid in the diagnosis of influenza from Nasopharyngeal swab specimens and should not be used as a sole basis for treatment. Nasal washings and aspirates are unacceptable for Xpert Xpress SARS-CoV-2/FLU/RSV testing.  Fact Sheet for Patients: EntrepreneurPulse.com.au  Fact Sheet for Healthcare Providers: IncredibleEmployment.be  This test is not yet approved or cleared by the [place] FDA and has been authorized for detection and/or diagnosis of SARS-CoV-2 by FDA under an Emergency Use Authorization (EUA). This EUA will remain in effect (meaning this test can be used) for the duration of the COVID-19 declaration under Section 564(b)(1) of the Act, 21  U.S.C. section 360bbb-3(b)(1), unless the authorization is terminated or revoked.     Resp Syncytial Virus by PCR NEGATIVE NEGATIVE Final    Comment: (NOTE) Fact Sheet for Patients: EntrepreneurPulse.com.au  Fact Sheet for Healthcare Providers: IncredibleEmployment.be  This test is not yet approved or cleared by the [place] FDA and has been authorized for detection and/or diagnosis of SARS-CoV-2 by FDA under an Emergency Use Authorization (EUA). This EUA will remain in effect (meaning this  test can be used) for the duration of the COVID-19 declaration under Section 564(b)(1) of the Act, 21 U.S.C. section 360bbb-3(b)(1), unless the authorization is terminated or revoked.  Performed at [facility], [place] [address] [place], [place] [address]     Imaging: CT Abd/Pelvis 05/12/20 IMPRESSION: Previously seen fluid collection in the lower pole of the left kidney has resolved. There continues to be stranding around the lower pole of the left kidney. In addition, there are new areas of decreased perfusion and striated nephrograms in the mid and upper pole of the left kidney concerning for pyelonephritis. New small low-density fluid collection in the midpole measures 12 mm and is concerning for new small developing abscess.  Aortoiliac atherosclerosis.  Dependent bibasilar atelectasis.   Electronically Signed   By: Rolm Baptise M.D.   On: 05/12/2020 22:22  Imp: 59 year old woman with acute left pyelonephritis and evolving small left renal abscess.  Leukocytosis improving.   Recommendations: -Reviewed films and this did not appear amenable to percutaneous drainage -Continue antibiotics according urine culture sensitivities  -If patient clinically declines in next 48 hours would consider repeat imaging of left kidney  Thank you for involving me in this patient's care.  Please page with any further questions or  concerns. Annalyssa Thune D Shawan Tosh

## 2020-05-13 NOTE — Evaluation (Signed)
Occupational Therapy Evaluation Patient Details Name: Maria [name] MRN: [id number] DOB: 05-13-1961 Today's Date: 05/13/2020    History of Present Illness 59 yo admitted with sepsis, pyelonephritis and encephalopathy. 11/18 Sz with continuous EEG. PMhx: epilepsy, intellectual disability, GERD.  Readmitted to [facility] from CIR.   Clinical Impression   Patient admitted from CIR for the above diagnosis.  See below for the current deficits impacting her functional status.  Currently she needs up to Min A and cues for basic mobility and transfers.  Min A for upper body ADL and Mod A for lower body ADL.  Max A for hygiene.  OT to follow in the acute setting.  CIR is recommended for additional rehab prior to returning home.         Follow Up Recommendations  Supervision/Assistance - 24 hour;CIR    Equipment Recommendations       Recommendations for Other Services       Precautions / Restrictions Precautions Precautions: Fall Precaution Comments: Contact Precautions. Restrictions Weight Bearing Restrictions: No      Mobility Bed Mobility Overal bed mobility: Needs Assistance Bed Mobility: Supine to Sit;Sit to Supine     Supine to sit: HOB elevated;Min guard Sit to supine: Min guard;HOB elevated        Transfers Overall transfer level: Needs assistance   Transfers: Sit to/from [facility] Sit to Stand: Min assist Stand pivot transfers: Min assist            Balance Overall balance assessment: Needs assistance Sitting-balance support: Feet supported Sitting balance-Leahy Scale: Fair     Standing balance support: Bilateral upper extremity supported Standing balance-Leahy Scale: Poor                             ADL either performed or assessed with clinical judgement   ADL Overall ADL's : Needs assistance/impaired     Grooming: Wash/dry face;Min guard;Sitting;Wash/dry hands   Upper Body Bathing: Minimal assistance;Sitting        Upper Body Dressing : Minimal assistance;Moderate assistance;Sitting       Toilet Transfer: Minimal assistance   Toileting- Clothing Manipulation and Hygiene: Total assistance               Vision Patient Visual Report: No change from baseline       Perception     Praxis      Pertinent Vitals/Pain Pain Assessment: No/denies pain     Hand Dominance Right   Extremity/Trunk Assessment Upper Extremity Assessment Upper Extremity Assessment: Overall WFL for tasks assessed   Lower Extremity Assessment Lower Extremity Assessment: Defer to PT evaluation   Cervical / Trunk Assessment Cervical / Trunk Assessment: Normal   Communication Communication Communication: Expressive difficulties   Cognition Arousal/Alertness: Awake/alert Behavior During Therapy: Flat affect Overall Cognitive Status: History of cognitive impairments - at baseline                     Current Attention Level: Sustained   Following Commands: Follows one step commands with increased time;Follows one step commands inconsistently Safety/Judgement: Decreased awareness of safety;Decreased awareness of deficits Awareness: Intellectual Problem Solving: Slow processing;Difficulty sequencing;Requires verbal cues;Requires tactile cues;Decreased initiation     General Comments   VSS    Exercises     Shoulder Instructions      Home Living Family/patient expects to be discharged to:: Private residence Living Arrangements: Parent;Other relatives Available Help at Discharge: Family;Available 24 hours/day Type of  Home: House Home Access: Stairs to enter [employer] of Steps: 1 Entrance Stairs-Rails: None Home Layout: Two level;Laundry or work area in basement;Able to live on main level with bedroom/bathroom     Bathroom Shower/Tub: Teacher, early years/pre: Standard Bathroom Accessibility: Yes How Accessible: Accessible via walker Home Equipment: [place] Rile - 2  wheels;Shower seat;Bedside commode;Wheelchair - manual   Additional Comments: Above information obtained per chart review   Lives With: Family;Son    Prior Functioning/Environment Level of Independence: Needs assistance  Gait / Transfers Assistance Needed: walks without RW ADL's / Homemaking Assistance Needed: performs ADLs on her own Communication / Swallowing Assistance Needed: slow processing for responses Comments: supervision for safety, cognition, medication; pt's mother reports pt would help with light household tasks such as washing dishes or vacuuming; pt's mother reports pt hasn't had a seizure in 3 years; pt/brother report pt is very sedentary at baseline sleeping a lot during the day and otherwise sitting and watching TV        OT Problem List: Decreased strength;Decreased activity tolerance;Decreased range of motion;Impaired balance (sitting and/or standing);Decreased cognition;Decreased safety awareness;Decreased knowledge of use of DME or AE;Decreased knowledge of precautions      OT Treatment/Interventions:      OT Goals(Current goals can be found in the care plan section) Acute Rehab OT Goals OT Goal Formulation: Patient unable to participate in goal setting Potential to Achieve Goals: Fair ADL Goals Pt Will Perform Grooming: with set-up;sitting Pt Will Perform Lower Body Bathing: with min guard assist;sit to/from stand Pt Will Perform Lower Body Dressing: with min guard assist;sit to/from stand Pt Will Transfer to Toilet: with min guard assist;ambulating;regular height toilet Pt Will Perform Toileting - Clothing Manipulation and hygiene: with min guard assist;sit to/from stand  OT Frequency: Min 2X/week   Barriers to D/C:    None Noted       Co-evaluation              AM-PAC OT "6 Clicks" Daily Activity     Outcome Measure Help from another person eating meals?: A Little Help from another person taking care of personal grooming?: A Little Help from  another person toileting, which includes using toliet, bedpan, or urinal?: Total Help from another person bathing (including washing, rinsing, drying)?: A Lot Help from another person to put on and taking off regular upper body clothing?: A Little Help from another person to put on and taking off regular lower body clothing?: A Lot 6 Click Score: 14   End of Session Nurse Communication: Mobility status  Activity Tolerance: Patient limited by fatigue Patient left: with call bell/phone within reach;in bed;with bed alarm set  OT Visit Diagnosis: Unsteadiness on feet (R26.81);Other abnormalities of gait and mobility (R26.89);Muscle weakness (generalized) (M62.81);Other symptoms and signs involving cognitive function                Time: 1013-1045 OT Time Calculation (min): 32 min Charges:  OT General Charges $OT Visit: 1 Visit OT Evaluation $OT Eval Moderate Complexity: 1 Mod OT Treatments $Self Care/Home Management : 8-22 mins  05/13/2020  Rich, OTR/L  Acute Rehabilitation Services  Office:  [place] 05/13/2020, 11:08 AM

## 2020-05-13 NOTE — Progress Notes (Signed)
Inpatient Rehab Admissions Coordinator Note:   Per therapy recommendations, pt was screened for CIR candidacy by Clemens Catholic, [place] CCC-SLP. Pt. Was recently sent  Back to acute from CIR d/t fever abdominal pain. I will place order for rehab consult per protocol for potential readmit once medical issues are managed.    Clemens Catholic, [place], [place] Admissions Coordinator  [phone] ([place]) [phone] (office)

## 2020-05-13 NOTE — Evaluation (Signed)
Physical Therapy Evaluation Patient Details Name: Maria [name] MRN: [id number] DOB: 1961-05-10 Today's Date: 05/13/2020   History of Present Illness  59 yo admitted with sepsis, pyelonephritis and encephalopathy. 11/18 Sz with continuous EEG. PMhx: epilepsy, intellectual disability, GERD.  Readmitted to [facility] from [facility].  Clinical Impression  Pt was seen for worsening infection leadign to sepsis, now readmitted to [facility].  Her plan is to work on mobility which is more hindered by fatigue than real loss of her previous independence, and then hopefully returning to CIR to complete therapy.  Pt is a person of previous independent gait skills and was tired from OT session when PT saw her today.  Work on standing tolerance, LE strength and balance challenges to increase her control and safety with gait.    Follow Up Recommendations CIR    Equipment Recommendations  None recommended by PT    Recommendations for Other Services Rehab consult     Precautions / Restrictions Precautions Precautions: Fall Precaution Comments: Contact Precautions. Restrictions Weight Bearing Restrictions: No      Mobility  Bed Mobility Overal bed mobility: Needs Assistance Bed Mobility: Rolling (scooting up) Rolling: Min assist   Supine to sit: HOB elevated;Min guard Sit to supine: Min guard;HOB elevated   General bed mobility comments: scooting mod assist with low effort and initiation with pt declining to sit up on side of bed    Transfers Overall transfer level: Needs assistance   Transfers: Sit to/from Stand;Stand Pivot Transfers Sit to Stand: Min assist Stand pivot transfers: Min assist       General transfer comment: declined to attempt but asked to sit very upright  Ambulation/Gait             General Gait Details: declined  Stairs            Wheelchair Mobility    Modified Rankin (Stroke Patients Only)       Balance Overall balance assessment: Needs  assistance Sitting-balance support: Feet supported Sitting balance-Leahy Scale: Fair     Standing balance support: Bilateral upper extremity supported Standing balance-Leahy Scale: Poor                               Pertinent Vitals/Pain Pain Assessment: No/denies pain    Home Living Family/patient expects to be discharged to:: Private residence Living Arrangements: Parent;Other relatives Available Help at Discharge: Family;Available 24 hours/day Type of Home: House Home Access: Stairs to enter Entrance Stairs-Rails: None Entrance Stairs-Number of Steps: 1 Home Layout: Two level;Laundry or work area in basement;Able to live on main level with bedroom/bathroom Home Equipment: Environmental consultant - 2 wheels;Shower seat;Bedside commode;Wheelchair - manual Additional Comments: Above information obtained per chart review     Prior Function Level of Independence: Needs assistance   Gait / Transfers Assistance Needed: independent perpt  ADL's / Homemaking Assistance Needed: per OT reports independence  Comments: supervision for safety, cognition, medication; pt's mother reports pt would help with light household tasks such as washing dishes or vacuuming; pt's mother reports pt hasn't had a seizure in 3 years; pt/brother report pt is very sedentary at baseline sleeping a lot during the day and otherwise sitting and watching TV     Hand Dominance   Dominant Hand: Right    Extremity/Trunk Assessment   Upper Extremity Assessment Upper Extremity Assessment: Defer to OT evaluation    Lower Extremity Assessment Lower Extremity Assessment: Generalized weakness    Cervical / Trunk  Assessment Cervical / Trunk Assessment: Normal  Communication   Communication: Expressive difficulties  Cognition Arousal/Alertness: Lethargic Behavior During Therapy: Flat affect Overall Cognitive Status: History of cognitive impairments - at baseline Area of Impairment: Problem  solving;Awareness;Safety/judgement;Following commands;Memory;Attention;Orientation                 Orientation Level: Situation Current Attention Level: Selective Memory: Decreased short-term memory Following Commands: Follows one step commands inconsistently;Follows one step commands with increased time Safety/Judgement: Decreased awareness of deficits Awareness: Intellectual Problem Solving: Slow processing;Requires verbal cues;Requires tactile cues;Decreased initiation        General Comments General comments (skin integrity, edema, etc.): Pt is given assistance to sit upright on bed and to support posture.  She is demonstrating alow endurance, low initiation and a need for rehab to recover baseline    Exercises     Assessment/Plan    PT Assessment Patient needs continued PT services  PT Problem List Decreased strength;Decreased activity tolerance;Decreased mobility;Decreased cognition;Decreased safety awareness       PT Treatment Interventions Gait training;Balance training;Functional mobility training;Therapeutic activities;Patient/family education;Neuromuscular re-education;DME instruction;Therapeutic exercise    PT Goals (Current goals can be found in the Care Plan section)  Acute Rehab PT Goals Patient Stated Goal: None stated PT Goal Formulation: Patient unable to participate in goal setting Time For Goal Achievement: 05/27/20 Potential to Achieve Goals: Good    Frequency Min 3X/week   Barriers to discharge Decreased caregiver support may require 24/7 with a second person to mobilize    Co-evaluation               AM-PAC PT "6 Clicks" Mobility  Outcome Measure Help needed turning from your back to your side while in a flat bed without using bedrails?: A Little Help needed moving from lying on your back to sitting on the side of a flat bed without using bedrails?: A Little Help needed moving to and from a bed to a chair (including a wheelchair)?: A  Lot Help needed standing up from a chair using your arms (e.g., wheelchair or bedside chair)?: A Lot Help needed to walk in hospital room?: A Lot Help needed climbing 3-5 steps with a railing? : Total 6 Click Score: 13    End of Session   Activity Tolerance: Patient limited by fatigue;Treatment limited secondary to medical complications (Comment) Patient left: in bed;with call bell/phone within reach;with bed alarm set Nurse Communication: Mobility status PT Visit Diagnosis: Muscle weakness (generalized) (M62.81);Difficulty in walking, not elsewhere classified (R26.2);Adult, failure to thrive (R62.7)    Time: 3007-6226 PT Time Calculation (min) (ACUTE ONLY): 26 min   Charges:   PT Evaluation $PT Eval Moderate Complexity: 1 Mod PT Treatments $Therapeutic Activity: 8-22 mins       Ramond Dial 05/13/2020, 12:54 PM  Mee Hives, PT MS Acute Rehab Dept. Number: [place] [facility] [place]

## 2020-05-14 LAB — VALPROIC ACID LEVEL: Valproic Acid Lvl: <10 ug/mL — ABNORMAL LOW (ref 50.0–100.0)

## 2020-05-14 LAB — CULTURE, BLOOD (ROUTINE X 2)
Special Requests: ADEQUATE
Special Requests: ADEQUATE

## 2020-05-14 MED ORDER — SODIUM CHLORIDE 0.9 % IV SOLN
1.0000 g | INTRAVENOUS | Status: DC
  Administered 2020-05-14 – 2020-05-17 (×4): 1000 mg via INTRAVENOUS
  Filled 2020-05-14 (×5): qty 1

## 2020-05-14 MED ORDER — QUETIAPINE FUMARATE 25 MG PO TABS
25.0000 mg | ORAL_TABLET | Freq: Two times a day (BID) | ORAL | Status: DC
  Administered 2020-05-14 – 2020-05-18 (×6): 25 mg via ORAL
  Filled 2020-05-14 (×7): qty 1

## 2020-05-14 NOTE — Progress Notes (Signed)
Inpatient Rehab Admissions Coordinator:   I spoke with Pt.'s sister and she confirmed that she wishes for Pt. To return to CIR once medical issues are managed and pt. Is stable for d/c.AC team will follow for medical readiness.  Clemens Catholic, [place], [place] Admissions Coordinator  [phone] ([place]) [phone] (office)

## 2020-05-14 NOTE — Progress Notes (Signed)
Chart check   A/p: esbl ecoli bacteremia/pyelo/left renal abscess  Appropriate clinical response on meropenem Reviewed with labs; ecoli is sensitive to ertapenem   -switch meropenem to ertapenem 1 gram iv daily for ease of administration -dr Tommy Medal will resume care tomorrow

## 2020-05-14 NOTE — Progress Notes (Signed)
PROGRESS NOTE    Maria [name]  BLT:[id number] DOB: June 23, 1960 DOA: 05/12/2020 PCP: Celene Squibb, MD  Outpatient Specialists:     Brief Narrative: As per H&P:  "[name] is a 59 year old female with past medical history of intellectual disability, seizure disorder, chronic diastolic CHF, GERD, chronic constipation, seen by [facility] in consultation on 05/11/2020 for new onset of fever and abnormal UA. Further work-up revealedsevere sepsis due to ESBL E. coli bacteremia from UTI. Transferring patient from CIR to [facility] progressive care unit on 05/12/2020. Low threshold to consult PCCM and transfer to ICU if she worsens.  Summary: Patient was originally admitted 04/14/2020 to [facility], diagnosed with septic shock requiring vasopressors, presumed from urinary source, transferred to [facility] ICU. CT scanner at time of admission suspicious for pyelonephritis and small abscess in the lower pole of the left kidney. Follow-up ultrasound 11 days later showed stable lesion felt to represent a small perinephric abscess versus renal cyst. Blood cultures drawn after antibiotics were negative but urine culture grew E. coli-not ESBL. Completed 14-day course of ceftriaxone. Hospital course complicated by breakthrough seizures, delirium and agitation requiring Precedex drip. Urology did not feel the abscess required any further intervention. Subsequently transferred to CIR on 05/05/2020. She was doing well until 05/10/2020 when she developed fever, UA suggestive of UTI, urine culture grew E. coli, briefly on Keflex, TRH consulted 12/9, final cultures were not back, treated with aggressive IV fluids due to sepsis, elevated lactate and AKI and changed to IV ceftriaxone. BCID 12/10+ for gram-negative rods and final urine culture shows ESBL E. coli. Antibiotics changed to IV meropenem, ID consulted. Patient transferring to [facility] for close monitoring and management".  05/13/2020: Patient reports not  feeling 'same".  Patient looks weak, and slowed.  Will decrease Klonopin from 0.5 Mg p.o. twice daily to 0.25 Mg p.o. twice daily, and will also decrease Valium from 2 Mg p.o. nightly to 1 Mg p.o. nightly.  Patient continues to report nausea.  Very mild abdominal distention is not, query gaseous.  CT abdomen results reviewed.  Prior documentation noted.  Patient will continue IV antibiotics for now. 05/14/2020: Patient seen.  Patient is sedated.  We reviewed mind altering medications.  Will decrease quetiapine to 25 mg twice daily.  Will optimize use of benzodiazepines.  Assessment & Plan:   Principal Problem:   Severe sepsis ([place]) Active Problems:   GERD (gastroesophageal reflux disease)   Intellectual disability   Seizure disorder (HCC)   Renal abscess, left   AKI (acute kidney injury) ([place])   ESBL (extended spectrum beta-lactamase) producing bacteria infection   Bacteremia due to Escherichia coli   Severe sepsis secondary to ESBL E. coli bacteremia from UTI, concern for unresolved/recurrent renal abscess, with acute organ dysfunction (AKI, lactic acidosis, AMS):  Urine culture 05/10/2020: ESBL E. coli >100 K colonies per mL.  BCID: E. coli. Follow final culture sensitivity results, likely to be the same ESBL E. coli from UTI.  Repeat chest x-ray 05/12/2020: No acute findings.  Renal ultrasound 05/11/2020: Mild left-sided collecting system dilatation. Previously demonstrated hypoechoic area arising from the lower pole of the left kidney is not well appreciated on this study.  S/p aggressive IV fluid bolus and maintenance IV fluids since 12/9, possibly got >4 L. Had cut down IVF to 10 mL/h due to new cough, some tachypnea, concern for pulmonary edema but chest x-ray negative, soft BP in the 90s, IV fluids back up to 50 mL/h.  Lactate peaked at 4.9,  normalized now.  Procalcitonin 0.9 > 2.47  Leukocytosis better from 18 > 15.  S/p Keflex x1 on 12/9, IV ceftriaxone x1 on 12/9, IV  meropenem 12/10 >  ID consulted, continue meropenem, requested repeat CT abdomen and pelvis with contrast to reevaluate possible renal abscess versus infected cyst.  CT abdomen revealed left pyelonephritis with mild involving left renal abscess (urology input is appreciated). 05/14/2020: Infectious disease team has changed meropenem to ertapenem.   Acute kidney injury  Resolved.  Lactic acidosis  Last lactic acid was 1.7.    Seizure disorder  History of breakthrough seizures during prior hospitalization.  Seizure precautions and continue AEDs.  Patient is currently on Vimpat and valproic acid.  Have a low threshold to consult neurology.  Chronic diastolic CHF  2D echo 63/14/9702: LVEF 60-65%.  Some concern this morning for developing pulmonary edema due to worsening cough, some tachypnea but apart from occasional basal crackles, clinically did not appear volume overloaded.  Chest x-ray without acute findings.  Monitor closely while on IV fluids.  Anemia, suspect chronic disease  Hemoglobin at baseline recently ranging in the 8-9 range. Hemoglobin dropped from 9.9 yesterday to 8.3 likely dilutional.  Follow CBC daily and transfuse if hemoglobin 7 g or less.  GERD:  Continue PPI.  Encephalopathy:  -Likely combined toxic and metabolic. -Continue treatment for pyelonephritis/ mild renal abscess.   -Acute kidney injury has resolved   DVT prophylaxis: Subcutaneous Lovenox Code Status: Full code Family Communication:  Disposition Plan: Likely back to CIR.   Consultants:   Urology  Procedures:   None  Antimicrobials:   IV meropenem.     Subjective: No history from patient.  Patient is sedated.  Objective: Vitals:   05/14/20 0800 05/14/20 1000 05/14/20 1017 05/14/20 1248  BP: (!) 161/78 107/72  (!) 149/78  Pulse: 98 96  93  Resp: (!) 29 20  20   Temp: 98 F (36.7 C) 98.7 F (37.1 C) 98.7 F (37.1 C) 97.8 F (36.6 C)  TempSrc: Oral Oral   Oral  SpO2: 94% 94%  94%  Weight:        Intake/Output Summary (Last 24 hours) at 05/14/2020 1329 Last data filed at 05/14/2020 1016 Gross per 24 hour  Intake 675.57 ml  Output 1340 ml  Net -664.43 ml   Filed Weights   05/13/20 0454 05/14/20 0402  Weight: 64.1 kg 60.8 kg    Examination:  General exam: Ill looking, but appears calm and comfortable.  Patient looks pale, will dry buccal mucosa. Respiratory system: Clear to auscultation.  Cardiovascular system: S1 & S2 heard. Gastrointestinal system: Mild gaseous distention of the abdomen, soft. No organomegaly or masses felt.  Central nervous system: Alert and oriented.  Slowed.  Patient moves all extremities.  Extremities: No leg edema.    Data Reviewed: I have personally reviewed following labs and imaging studies  CBC: Recent Labs  Lab 05/08/20 0725 05/10/20 2205 05/11/20 0309 05/11/20 1542 05/12/20 0403 05/13/20 0114  WBC 6.3 10.2 14.8* 18.4* 15.5* 10.4  NEUTROABS 3.8 8.1*  --  15.6*  --   --   HGB 9.2* 9.8* 8.9* 9.9* 8.3* 7.7*  HCT 29.7* 30.4* 29.3* 30.6* 25.8* 23.7*  MCV 96.1 93.3 95.8 93.9 94.2 94.0  PLT 371 337 301 300 233 637   Basic Metabolic Panel: Recent Labs  Lab 05/08/20 0725 05/11/20 1542 05/12/20 0403 05/13/20 0114  NA 138 134* 137 135  K 3.8 4.0 3.9 4.1  CL 99 95* 106 104  CO2  26 22 20* 21*  GLUCOSE 100* 153* 145* 133*  BUN 16 17 12 10   CREATININE 1.12* 1.42* 1.03* 0.82  CALCIUM 9.7 9.5 7.9* 8.2*   GFR: Estimated Creatinine Clearance: 70 mL/min (by C-G formula based on SCr of 0.82 mg/dL). Liver Function Tests: Recent Labs  Lab 05/08/20 0725 05/12/20 0403 05/13/20 0114  AST 29 17 36  ALT 42 19 25  ALKPHOS 111 76 72  BILITOT 0.6 0.4 0.6  PROT 7.2 5.6* 5.5*  ALBUMIN 2.8* 2.2* 2.1*   No results for input(s): LIPASE, AMYLASE in the last 168 hours. No results for input(s): AMMONIA in the last 168 hours. Coagulation Profile: No results for input(s): INR, PROTIME in the last 168  hours. Cardiac Enzymes: No results for input(s): CKTOTAL, CKMB, CKMBINDEX, TROPONINI in the last 168 hours. BNP (last 3 results) No results for input(s): PROBNP in the last 8760 hours. HbA1C: No results for input(s): HGBA1C in the last 72 hours. CBG: Recent Labs  Lab 05/11/20 1635 05/11/20 2107 05/12/20 0551 05/12/20 1139 05/12/20 1703  GLUCAP 155* 113* 120* 111* 94   Lipid Profile: No results for input(s): CHOL, HDL, LDLCALC, TRIG, CHOLHDL, LDLDIRECT in the last 72 hours. Thyroid Function Tests: No results for input(s): TSH, T4TOTAL, FREET4, T3FREE, THYROIDAB in the last 72 hours. Anemia Panel: No results for input(s): VITAMINB12, FOLATE, FERRITIN, TIBC, IRON, RETICCTPCT in the last 72 hours. Urine analysis:    Component Value Date/Time   COLORURINE YELLOW 05/10/2020 1739   APPEARANCEUR CLOUDY (A) 05/10/2020 1739   LABSPEC 1.011 05/10/2020 1739   PHURINE 6.0 05/10/2020 1739   GLUCOSEU NEGATIVE 05/10/2020 1739   HGBUR MODERATE (A) 05/10/2020 1739   BILIRUBINUR NEGATIVE 05/10/2020 1739   KETONESUR NEGATIVE 05/10/2020 1739   PROTEINUR 100 (A) 05/10/2020 1739   UROBILINOGEN 0.2 10/26/2014 2340   NITRITE POSITIVE (A) 05/10/2020 1739   LEUKOCYTESUR LARGE (A) 05/10/2020 1739   Sepsis Labs: @LABRCNTIP (procalcitonin:4,lacticidven:4)  ) Recent Results (from the past 240 hour(s))  Culture, Urine     Status: Abnormal   Collection Time: 05/10/20  5:39 PM   Specimen: Urine, Clean Catch  Result Value Ref Range Status   Specimen Description URINE, CLEAN CATCH  Final   Special Requests   Final    NONE Performed at [facility], [place] [address], [place], [place] [address]    Culture (A)  Final    >=100,000 COLONIES/mL ESCHERICHIA COLI Confirmed Extended Spectrum Beta-Lactamase Producer (ESBL).  In bloodstream infections from ESBL organisms, carbapenems are preferred over piperacillin/tazobactam. They are shown to have a lower risk of mortality.    Report Status 05/12/2020  FINAL  Final   Organism ID, Bacteria ESCHERICHIA COLI (A)  Final      Susceptibility   Escherichia coli - MIC*    AMPICILLIN >=32 RESISTANT Resistant     CEFAZOLIN >=64 RESISTANT Resistant     CEFEPIME 16 RESISTANT Resistant     CEFTRIAXONE >=64 RESISTANT Resistant     CIPROFLOXACIN >=4 RESISTANT Resistant     GENTAMICIN >=16 RESISTANT Resistant     IMIPENEM <=0.25 SENSITIVE Sensitive     NITROFURANTOIN 128 RESISTANT Resistant     TRIMETH/SULFA >=320 RESISTANT Resistant     AMPICILLIN/SULBACTAM >=32 RESISTANT Resistant     PIP/TAZO 16 SENSITIVE Sensitive     * >=100,000 COLONIES/mL ESCHERICHIA COLI  Culture, blood (routine x 2)     Status: Abnormal   Collection Time: 05/11/20  5:37 PM   Specimen: BLOOD RIGHT HAND  Result Value  Ref Range Status   Specimen Description BLOOD RIGHT HAND  Final   Special Requests   Final    BOTTLES DRAWN AEROBIC AND ANAEROBIC Blood Culture adequate volume   Culture  Setup Time   Final    GRAM NEGATIVE RODS IN BOTH AEROBIC AND ANAEROBIC BOTTLES CRITICAL RESULT CALLED TO, READ BACK BY AND VERIFIED WITH: [place] F. [phone] FCP Performed at [facility], [place] [address], [place], [place] [address]    Culture (A)  Final    ESCHERICHIA COLI Confirmed Extended Spectrum Beta-Lactamase Producer (ESBL).  In bloodstream infections from ESBL organisms, carbapenems are preferred over piperacillin/tazobactam. They are shown to have a lower risk of mortality.    Report Status 05/14/2020 FINAL  Final   Organism ID, Bacteria ESCHERICHIA COLI  Final      Susceptibility   Escherichia coli - MIC*    AMPICILLIN >=32 RESISTANT Resistant     CEFAZOLIN >=64 RESISTANT Resistant     CEFEPIME >=32 RESISTANT Resistant     CEFTAZIDIME >=64 RESISTANT Resistant     CEFTRIAXONE >=64 RESISTANT Resistant     CIPROFLOXACIN >=4 RESISTANT Resistant     GENTAMICIN >=16 RESISTANT Resistant     IMIPENEM <=0.25 SENSITIVE Sensitive     TRIMETH/SULFA >=320 RESISTANT  Resistant     AMPICILLIN/SULBACTAM >=32 RESISTANT Resistant     PIP/TAZO 16 SENSITIVE Sensitive     * ESCHERICHIA COLI  Blood Culture ID Panel (Reflexed)     Status: Abnormal   Collection Time: 05/11/20  5:37 PM  Result Value Ref Range Status   Enterococcus faecalis NOT DETECTED NOT DETECTED Final   Enterococcus Faecium NOT DETECTED NOT DETECTED Final   Listeria monocytogenes NOT DETECTED NOT DETECTED Final   Staphylococcus species NOT DETECTED NOT DETECTED Final   Staphylococcus aureus (BCID) NOT DETECTED NOT DETECTED Final   Staphylococcus epidermidis NOT DETECTED NOT DETECTED Final   Staphylococcus lugdunensis NOT DETECTED NOT DETECTED Final   Streptococcus species NOT DETECTED NOT DETECTED Final   Streptococcus agalactiae NOT DETECTED NOT DETECTED Final   Streptococcus pneumoniae NOT DETECTED NOT DETECTED Final   Streptococcus pyogenes NOT DETECTED NOT DETECTED Final   A.calcoaceticus-baumannii NOT DETECTED NOT DETECTED Final   Bacteroides fragilis NOT DETECTED NOT DETECTED Final   Enterobacterales DETECTED (A) NOT DETECTED Final    Comment: Enterobacterales represent a large order of gram negative bacteria, not a single organism. CRITICAL RESULT CALLED TO, READ BACK BY AND VERIFIED WITH: PHARMD JEREMY F. [phone] FCP    Enterobacter cloacae complex NOT DETECTED NOT DETECTED Final   Escherichia coli DETECTED (A) NOT DETECTED Final    Comment: CRITICAL RESULT CALLED TO, READ BACK BY AND VERIFIED WITH: PHARMD JEREMY F. [phone] FCP    Klebsiella aerogenes NOT DETECTED NOT DETECTED Final   Klebsiella oxytoca NOT DETECTED NOT DETECTED Final   Klebsiella pneumoniae NOT DETECTED NOT DETECTED Final   Proteus species NOT DETECTED NOT DETECTED Final   Salmonella species NOT DETECTED NOT DETECTED Final   Serratia marcescens NOT DETECTED NOT DETECTED Final   Haemophilus influenzae NOT DETECTED NOT DETECTED Final   Neisseria meningitidis NOT DETECTED NOT DETECTED Final    Pseudomonas aeruginosa NOT DETECTED NOT DETECTED Final   Stenotrophomonas maltophilia NOT DETECTED NOT DETECTED Final   Candida albicans NOT DETECTED NOT DETECTED Final   Candida auris NOT DETECTED NOT DETECTED Final   Candida glabrata NOT DETECTED NOT DETECTED Final   Candida krusei NOT DETECTED NOT DETECTED Final  Candida parapsilosis NOT DETECTED NOT DETECTED Final   Candida tropicalis NOT DETECTED NOT DETECTED Final   Cryptococcus neoformans/gattii NOT DETECTED NOT DETECTED Final   CTX-M ESBL DETECTED (A) NOT DETECTED Final    Comment: CRITICAL RESULT CALLED TO, READ BACK BY AND VERIFIED WITH: PHARMD JEREMY F. [phone] FCP (NOTE) Extended spectrum beta-lactamase detected. Recommend a carbapenem as initial therapy.      Carbapenem resistance IMP NOT DETECTED NOT DETECTED Final   Carbapenem resistance KPC NOT DETECTED NOT DETECTED Final   Carbapenem resistance NDM NOT DETECTED NOT DETECTED Final   Carbapenem resist OXA 48 LIKE NOT DETECTED NOT DETECTED Final   Carbapenem resistance VIM NOT DETECTED NOT DETECTED Final    Comment: Performed at [facility], [place] [address] [place], [place] [address]  Culture, blood (routine x 2)     Status: Abnormal   Collection Time: 05/11/20  5:51 PM   Specimen: BLOOD  Result Value Ref Range Status   Specimen Description BLOOD RIGHT ANTECUBITAL  Final   Special Requests   Final    BOTTLES DRAWN AEROBIC AND ANAEROBIC Blood Culture adequate volume   Culture  Setup Time   Final    GRAM NEGATIVE RODS AEROBIC BOTTLE ONLY CRITICAL VALUE NOTED.  VALUE IS CONSISTENT WITH PREVIOUSLY REPORTED AND CALLED VALUE.    Culture (A)  Final    ESCHERICHIA COLI SUSCEPTIBILITIES PERFORMED ON PREVIOUS CULTURE WITHIN THE LAST 5 DAYS. Performed at [facility], [place] [address], [place],  [address]    Report Status 05/14/2020 FINAL  Final  Resp panel by RT-PCR (RSV, Flu A&B, Covid) Nasopharyngeal Swab     Status: None   Collection Time:  05/12/20  6:06 PM   Specimen: Nasopharyngeal Swab; Nasopharyngeal(NP) swabs in vial transport medium  Result Value Ref Range Status   SARS Coronavirus 2 by RT PCR NEGATIVE NEGATIVE Final    Comment: (NOTE) SARS-CoV-2 target nucleic acids are NOT DETECTED.  The SARS-CoV-2 RNA is generally detectable in upper respiratory specimens during the acute phase of infection. The lowest concentration of SARS-CoV-2 viral copies this assay can detect is 138 copies/mL. A negative result does not preclude SARS-Cov-2 infection and should not be used as the sole basis for treatment or other patient management decisions. A negative result may occur with  improper specimen collection/handling, submission of specimen other than nasopharyngeal swab, presence of viral mutation(s) within the areas targeted by this assay, and inadequate number of viral copies(<138 copies/mL). A negative result must be combined with clinical observations, patient history, and epidemiological information. The expected result is Negative.  Fact Sheet for Patients:  EntrepreneurPulse.com.au  Fact Sheet for Healthcare Providers:  IncredibleEmployment.be  This test is no t yet approved or cleared by the [place] FDA and  has been authorized for detection and/or diagnosis of SARS-CoV-2 by FDA under an Emergency Use Authorization (EUA). This EUA will remain  in effect (meaning this test can be used) for the duration of the COVID-19 declaration under Section 564(b)(1) of the Act, 21 U.S.C.section 360bbb-3(b)(1), unless the authorization is terminated  or revoked sooner.       Influenza A by PCR NEGATIVE NEGATIVE Final   Influenza B by PCR NEGATIVE NEGATIVE Final    Comment: (NOTE) The Xpert Xpress SARS-CoV-2/FLU/RSV plus assay is intended as an aid in the diagnosis of influenza from Nasopharyngeal swab specimens and should not be used as a sole basis for treatment. Nasal washings  and aspirates are unacceptable for Xpert Xpress  SARS-CoV-2/FLU/RSV testing.  Fact Sheet for Patients: EntrepreneurPulse.com.au  Fact Sheet for Healthcare Providers: IncredibleEmployment.be  This test is not yet approved or cleared by the [place] FDA and has been authorized for detection and/or diagnosis of SARS-CoV-2 by FDA under an Emergency Use Authorization (EUA). This EUA will remain in effect (meaning this test can be used) for the duration of the COVID-19 declaration under Section 564(b)(1) of the Act, 21 U.S.C. section 360bbb-3(b)(1), unless the authorization is terminated or revoked.     Resp Syncytial Virus by PCR NEGATIVE NEGATIVE Final    Comment: (NOTE) Fact Sheet for Patients: EntrepreneurPulse.com.au  Fact Sheet for Healthcare Providers: IncredibleEmployment.be  This test is not yet approved or cleared by the [place] FDA and has been authorized for detection and/or diagnosis of SARS-CoV-2 by FDA under an Emergency Use Authorization (EUA). This EUA will remain in effect (meaning this test can be used) for the duration of the COVID-19 declaration under Section 564(b)(1) of the Act, 21 U.S.C. section 360bbb-3(b)(1), unless the authorization is terminated or revoked.  Performed at [facility], [place] [address] [place], [place] [address]          Radiology Studies: DG Abd 1 View  Result Date: 05/13/2020 CLINICAL DATA:  Abdominal distension. EXAM: ABDOMEN - 1 VIEW COMPARISON:  May 10, 2020. FINDINGS: The bowel gas pattern is normal. No radio-opaque calculi or other significant radiographic abnormality are seen. IMPRESSION: Negative. Electronically Signed   By: Marijo Conception M.D.   On: 05/13/2020 15:55   CT ABDOMEN PELVIS W CONTRAST  Result Date: 05/12/2020 CLINICAL DATA:  Follow-up possible renal abscess EXAM: CT ABDOMEN AND PELVIS WITH CONTRAST TECHNIQUE:  Multidetector CT imaging of the abdomen and pelvis was performed using the standard protocol following bolus administration of intravenous contrast. CONTRAST:  74mL OMNIPAQUE IOHEXOL 300 MG/ML  SOLN COMPARISON:  04/14/2020 FINDINGS: Lower chest: Bibasilar dependent atelectasis. Heart is borderline in size. Hepatobiliary: No focal hepatic abnormality. Gallbladder unremarkable. Pancreas: No focal abnormality or ductal dilatation. Spleen: No focal abnormality.  Normal size. Adrenals/Urinary Tract: Areas of decreased renal perfusion within the upper and mid poles of the left kidney, new since prior study concerning for pyelonephritis. New focal small low-density lesion in the midpole of the left kidney measures 12 mm and could reflect a small developing abscess. The previously seen fluid collection in the lower pole of the left kidney has resolved. There is perinephric stranding most notable around the lower pole. No hydronephrosis. No focal abnormality on the right. Adrenal glands and urinary bladder unremarkable. Stomach/Bowel: Stomach, large and small bowel grossly unremarkable. Normal appendix Vascular/Lymphatic: Aortoiliac atherosclerosis. No evidence of aneurysm or adenopathy. Reproductive: Prior hysterectomy.  No adnexal masses. Other: No free fluid or free air. Musculoskeletal: No acute bony abnormality. IMPRESSION: Previously seen fluid collection in the lower pole of the left kidney has resolved. There continues to be stranding around the lower pole of the left kidney. In addition, there are new areas of decreased perfusion and striated nephrograms in the mid and upper pole of the left kidney concerning for pyelonephritis. New small low-density fluid collection in the midpole measures 12 mm and is concerning for new small developing abscess. Aortoiliac atherosclerosis. Dependent bibasilar atelectasis. Electronically Signed   By: Rolm Baptise M.D.   On: 05/12/2020 22:30        Scheduled Meds: .  atorvastatin  20 mg Oral QPM  . benzonatate  200 mg Oral TID  . chlorhexidine  15 mL Mouth Rinse  BID  . clonazepam  0.25 mg Oral BID  . docusate sodium  100 mg Oral BID  . enoxaparin (LOVENOX) injection  40 mg Subcutaneous Q24H  . feeding supplement  237 mL Oral TID BM  . mouth rinse  15 mL Mouth Rinse q12n4p  . metoprolol tartrate  25 mg Oral BID  . mometasone-formoterol  2 puff Inhalation BID  . multivitamin with minerals  1 tablet Oral Daily  . pantoprazole (PROTONIX) IV  40 mg Intravenous Q24H  . QUEtiapine  25 mg Oral BID  . sodium chloride flush  3 mL Intravenous Q12H   Continuous Infusions: . ertapenem    . lacosamide (VIMPAT) IV 200 mg (05/14/20 1125)  . valproate sodium 500 mg (05/14/20 0922)     LOS: 2 days    Time spent: 35 minutes    Dana Allan, MD  [facility] Pager #: [phone] 7PM-7AM contact night coverage as above

## 2020-05-15 ENCOUNTER — Other Ambulatory Visit: Payer: Self-pay

## 2020-05-15 ENCOUNTER — Encounter (HOSPITAL_COMMUNITY): Payer: Self-pay | Admitting: Internal Medicine

## 2020-05-15 DIAGNOSIS — R7989 Other specified abnormal findings of blood chemistry: Secondary | ICD-10-CM

## 2020-05-15 DIAGNOSIS — R7881 Bacteremia: Secondary | ICD-10-CM

## 2020-05-15 DIAGNOSIS — B9620 Unspecified Escherichia coli [E. coli] as the cause of diseases classified elsewhere: Secondary | ICD-10-CM

## 2020-05-15 DIAGNOSIS — N151 Renal and perinephric abscess: Secondary | ICD-10-CM

## 2020-05-15 DIAGNOSIS — N10 Acute tubulo-interstitial nephritis: Secondary | ICD-10-CM

## 2020-05-15 LAB — CBC
HCT: 27 % — ABNORMAL LOW (ref 36.0–46.0)
Hemoglobin: 8.8 g/dL — ABNORMAL LOW (ref 12.0–15.0)
MCH: 29.5 pg (ref 26.0–34.0)
MCHC: 32.6 g/dL (ref 30.0–36.0)
MCV: 90.6 fL (ref 80.0–100.0)
Platelets: 217 10*3/uL (ref 150–400)
RBC: 2.98 MIL/uL — ABNORMAL LOW (ref 3.87–5.11)
RDW: 15.4 % (ref 11.5–15.5)
WBC: 5.2 10*3/uL (ref 4.0–10.5)
nRBC: 0 % (ref 0.0–0.2)

## 2020-05-15 LAB — C-REACTIVE PROTEIN: CRP: 4.1 mg/dL — ABNORMAL HIGH (ref <1.0)

## 2020-05-15 LAB — SEDIMENTATION RATE: Sed Rate: 95 mm/hr — ABNORMAL HIGH (ref 0–22)

## 2020-05-15 LAB — COMPREHENSIVE METABOLIC PANEL
ALT: 99 U/L — ABNORMAL HIGH (ref 0–44)
AST: 98 U/L — ABNORMAL HIGH (ref 15–41)
Albumin: 2.3 g/dL — ABNORMAL LOW (ref 3.5–5.0)
Alkaline Phosphatase: 113 U/L (ref 38–126)
Anion gap: 14 (ref 5–15)
BUN: 12 mg/dL (ref 6–20)
CO2: 23 mmol/L (ref 22–32)
Calcium: 8.6 mg/dL — ABNORMAL LOW (ref 8.9–10.3)
Chloride: 98 mmol/L (ref 98–111)
Creatinine, Ser: 0.6 mg/dL (ref 0.44–1.00)
GFR, Estimated: >60 mL/min (ref >60)
Glucose, Bld: 97 mg/dL (ref 70–99)
Potassium: 3.4 mmol/L — ABNORMAL LOW (ref 3.5–5.1)
Sodium: 135 mmol/L (ref 135–145)
Total Bilirubin: 0.4 mg/dL (ref 0.3–1.2)
Total Protein: 6 g/dL — ABNORMAL LOW (ref 6.5–8.1)

## 2020-05-15 MED ORDER — METOPROLOL TARTRATE 25 MG PO TABS
25.0000 mg | ORAL_TABLET | ORAL | Status: AC
  Administered 2020-05-15: 13:00:00 25 mg via ORAL
  Filled 2020-05-15: qty 1

## 2020-05-15 MED ORDER — DIVALPROEX SODIUM 500 MG PO DR TAB
500.0000 mg | DELAYED_RELEASE_TABLET | Freq: Two times a day (BID) | ORAL | Status: DC
  Administered 2020-05-15 – 2020-05-16 (×3): 500 mg via ORAL
  Filled 2020-05-15 (×4): qty 1

## 2020-05-15 MED ORDER — CHLORHEXIDINE GLUCONATE 0.12 % MT SOLN
15.0000 mL | Freq: Two times a day (BID) | OROMUCOSAL | Status: DC
  Administered 2020-05-15 – 2020-05-17 (×4): 15 mL via OROMUCOSAL
  Filled 2020-05-15 (×4): qty 15

## 2020-05-15 MED ORDER — POTASSIUM CHLORIDE CRYS ER 20 MEQ PO TBCR
40.0000 meq | EXTENDED_RELEASE_TABLET | Freq: Once | ORAL | Status: AC
  Administered 2020-05-15: 13:00:00 40 meq via ORAL
  Filled 2020-05-15: qty 2

## 2020-05-15 MED ORDER — ONDANSETRON HCL 4 MG/2ML IJ SOLN
4.0000 mg | Freq: Four times a day (QID) | INTRAMUSCULAR | Status: DC | PRN
  Administered 2020-05-15 – 2020-05-16 (×4): 4 mg via INTRAVENOUS
  Filled 2020-05-15 (×4): qty 2

## 2020-05-15 MED ORDER — METOCLOPRAMIDE HCL 5 MG/ML IJ SOLN
5.0000 mg | Freq: Three times a day (TID) | INTRAMUSCULAR | Status: DC | PRN
  Administered 2020-05-15 – 2020-05-17 (×4): 5 mg via INTRAVENOUS
  Filled 2020-05-15 (×4): qty 2

## 2020-05-15 MED ORDER — METOPROLOL TARTRATE 50 MG PO TABS
50.0000 mg | ORAL_TABLET | Freq: Two times a day (BID) | ORAL | Status: DC
  Administered 2020-05-15 – 2020-05-17 (×4): 50 mg via ORAL
  Filled 2020-05-15 (×5): qty 1

## 2020-05-15 MED ORDER — ORAL CARE MOUTH RINSE
15.0000 mL | Freq: Two times a day (BID) | OROMUCOSAL | Status: DC
  Administered 2020-05-16 – 2020-05-17 (×3): 15 mL via OROMUCOSAL

## 2020-05-15 NOTE — Progress Notes (Addendum)
PHARMACIST - PHYSICIAN COMMUNICATION  DR:   Lupita Leash  CONCERNING: IV to Oral Route Change Policy  RECOMMENDATION: This patient is receiving valproic acid by the intravenous route.  Based on criteria approved by the Pharmacy and Therapeutics Committee, the intravenous medication(s) is/are being converted to the equivalent oral dose form(s).   DESCRIPTION: These criteria include:  The patient is eating (either orally or via tube) and/or has been taking other orally administered medications for a least 24 hours  The patient has no evidence of active gastrointestinal bleeding or impaired GI absorption (gastrectomy, short bowel, patient on TNA or NPO).  If you have questions about this conversion, please contact the Pharmacy Department  []   [phone] )  [facility] []   [phone] )  [facility] [x]   [phone] )  [facility] []   [phone] )  [facility] []   [phone] )  [place], [place] 05/15/2020 10:29 AM   Addendum  K+ came back as 3.4 this AM. Ok to give KCL 40 meq x1 per Dr. Vallarie Mare, PharmD, BCIDP, AAHIVP, CPP Infectious Disease Pharmacist 05/15/2020 11:25 AM

## 2020-05-15 NOTE — Progress Notes (Signed)
PROGRESS NOTE    SINDIA KOWALCZYK  ULA:[id number] DOB: 09-Feb-1961 DOA: 05/12/2020 PCP: Celene Squibb, MD   Brief Narrative: 59 year old female with significant history of intellectual disability/seizure disorder, chronic diastolic CHF, GERD, chronic constipation was seen by [facility] in consultation on 05/11/20 for new onset of fever and abnormal UA, further work-up revealed severe sepsis due to ESBL E. coli bacteremia and UTI, patient was transferred from [facility] progressive care return 12/10 for further management, was seen by PCCM, ID. Originally admitted at [facility] 04/14/2020 with septic shock requiring vasopressors presumed to be urinary source and transferred to [facility] ICU, CIN suspicious for pyelonephritis and small abscess in the left lower pole kidney, ultrasound 11 days later showed stable lesion felt to be representing a small perinephric abscess versus renal cyst, blood cultures drawn and negative but urine culture grew coli, not ESBL.  She completed a 14 days course of ceftriaxone.  Hospital course complicated by breakthrough seizures delirium agitation requiring Precedex, urology did not feel the abscess required any further intervention.  She was transferred subsequently to CIR on 05/05/2020 and was doing well until 05/11/2019 when she developed fever and abnormal UA suggestive of UTI and urine culture grew E. coli, and TRH was consulted 12/9.  Subjective: BP running high and c/o nausea this am and pain in LLQ/?left flank More alert and awake compared to last few days and able to communicate well.  Assessment & Plan:  Severe sepsis 2/2 ESBL E Coli bacteremia w/ pyelonephritis and left renal abscess: VSS stable, BP on higer side, a febrile, responed well to meropenem and switched to invanz 12/21 as per ID.  Discussed with infectious disease this morning who has been following him for several days and clinically appears to be improving although having nausea this morning.  Recent imaging shows  evolving 12 mm likely abscess, seen by neurology over the weekend and no distinct fluid collection that looked amenable to drainage on CT.  Requesting IR to evaluate for possible drainage procedure of the renal abscess.  Monitor LFTs ESR CRP and temperature curve.  Monitor for further seizure activity while on Invanz  Lethargy/Acute metabolic encephalopathy likely in the setting of sepsis: Much more alert and awake.  Klonopin dose was decreased over the weekend.  On Klonopin 0.25 mg BID and Seroquel 25 twice daily.  Continue cautiously and minimize  Transaminitis AST ALT elevated - Monitor.  GERD w/ Nausea- add prn zofran this am, if does not respond then will dose of Reglan , trying to avoid Phenergan to minimize sedation.  Hypokalemia replete  Intellectual disability: Chronic.  Supportive measures.  Seizure disorder: History of breakthrough seizures during prior hospitalization, continue current AED with Vimpat, valproate Klonopin.  Monitor while on Carbapenem  AKI -resolved Lactic acidosis resolved  Chronic diastolic CHF with EF 60 to 65% on echo 04/17/2020. Chest x-ray 12/10 stable.  Monitor for any signs of fluid overload.  GERD continue PPI  Anemia likely from chronic disease.  Most recent hemoglobin ranging 8 to 9 g.  Monitor. Recent Labs  Lab 05/11/20 0309 05/11/20 1542 05/12/20 0403 05/13/20 0114 05/15/20 0759  HGB 8.9* 9.9* 8.3* 7.7* 8.8*  HCT 29.3* 30.6* 25.8* 23.7* 27.0*   Nutrition: Diet Order            DIET DYS 3 Room service appropriate? Yes; Fluid consistency: Thin  Diet effective now                 Body mass index is 21.03 kg/m.  DVT prophylaxis: enoxaparin (LOVENOX) injection 40 mg Start: 05/13/20 1000 Code Status:   Code Status: Full Code  Family Communication: plan of care discussed with patient at bedside.  Discussed with the nursing staff and infectious disease.  Status is: Inpatient Remains inpatient appropriate because:Ongoing diagnostic  testing needed not appropriate for outpatient work up, Unsafe d/c plan and IV treatments appropriate due to intensity of illness or inability to take PO  Dispo: The patient is from: CIR              Anticipated d/c is to: CIR              Anticipated d/c date is: 3 days              Patient currently is not medically stable to d/c. Consultants:see note  Urology, infectious disease. Procedures:see note  Culture/Microbiology    Component Value Date/Time   SDES BLOOD RIGHT ANTECUBITAL 05/11/2020 1751   SPECREQUEST  05/11/2020 1751    BOTTLES DRAWN AEROBIC AND ANAEROBIC Blood Culture adequate volume   CULT (A) 05/11/2020 1751    ESCHERICHIA COLI SUSCEPTIBILITIES PERFORMED ON PREVIOUS CULTURE WITHIN THE LAST 5 DAYS. Performed at [facility], [place] [address] [place], [place] [address]    REPTSTATUS 05/14/2020 FINAL 05/11/2020 1751    Other culture-see note  Medications: Scheduled Meds: . atorvastatin  20 mg Oral QPM  . benzonatate  200 mg Oral TID  . chlorhexidine  15 mL Mouth Rinse BID  . clonazepam  0.25 mg Oral BID  . docusate sodium  100 mg Oral BID  . enoxaparin (LOVENOX) injection  40 mg Subcutaneous Q24H  . feeding supplement  237 mL Oral TID BM  . mouth rinse  15 mL Mouth Rinse q12n4p  . metoprolol tartrate  25 mg Oral BID  . mometasone-formoterol  2 puff Inhalation BID  . multivitamin with minerals  1 tablet Oral Daily  . pantoprazole (PROTONIX) IV  40 mg Intravenous Q24H  . QUEtiapine  25 mg Oral BID  . sodium chloride flush  3 mL Intravenous Q12H   Continuous Infusions: . ertapenem 200 mL/hr at 05/14/20 1719  . lacosamide (VIMPAT) IV 200 mg (05/14/20 2250)  . valproate sodium 500 mg (05/15/20 0930)    Antimicrobials: Anti-infectives (From admission, onward)   Start     Dose/Rate Route Frequency Ordered Stop   05/14/20 1600  ertapenem (INVANZ) 1,000 mg in sodium chloride 0.9 % 100 mL IVPB        1 g 200 mL/hr over 30 Minutes Intravenous Every 24 hours  05/14/20 1259     05/12/20 2200  meropenem (MERREM) 1 g in sodium chloride 0.9 % 100 mL IVPB  Status:  Discontinued        1 g 200 mL/hr over 30 Minutes Intravenous Every 8 hours 05/12/20 1741 05/14/20 1259     Objective: Vitals: Today's Vitals   05/15/20 0747 05/15/20 0818 05/15/20 0921 05/15/20 0922  BP:   (!) 169/99 (!) 161/81  Pulse:   92   Resp:   14   Temp:   98.3 F (36.8 C)   TempSrc:   Oral   SpO2:  98% 98%   Weight:      PainSc: 0-No pain  0-No pain     Intake/Output Summary (Last 24 hours) at 05/15/2020 1010 Last data filed at 05/15/2020 0350 Gross per 24 hour  Intake 731.98 ml  Output 2750 ml  Net -2018.02 ml   [employer]  05/13/20 0454 05/14/20 0402 05/15/20 0413  Weight: 64.1 kg 60.8 kg 59.1 kg   Weight change: -1.7 kg  Intake/Output from previous day: 12/12 0701 - 12/13 0700 In: 732 [P.O.:360; IV Piggyback:372] Out: 2750 [Urine:2750] Intake/Output this shift: No intake/output data recorded.  Examination: General exam: AAOx2-3,on RA, NAD, weak appearing. HEENT:Oral mucosa moist, Ear/Nose WNL grossly,dentition normal. Respiratory system: bilaterally CLEAR,no wheezing or crackles,no use of accessory muscle, non tender. Cardiovascular system: S1 & S2 +, regular, No JVD. Gastrointestinal system: Abdomen soft, mildly Tender left lower abdomen,ND, BS+. Nervous System:Alert, awake, moving extremities and grossly nonfocal Extremities: No edema, distal peripheral pulses palpable.  Skin: No rashes,no icterus. MSK: Normal muscle bulk,tone, power  Data Reviewed: I have personally reviewed following labs and imaging studies CBC: Recent Labs  Lab 05/10/20 2205 05/11/20 0309 05/11/20 1542 05/12/20 0403 05/13/20 0114 05/15/20 0759  WBC 10.2 14.8* 18.4* 15.5* 10.4 5.2  NEUTROABS 8.1*  --  15.6*  --   --   --   HGB 9.8* 8.9* 9.9* 8.3* 7.7* 8.8*  HCT 30.4* 29.3* 30.6* 25.8* 23.7* 27.0*  MCV 93.3 95.8 93.9 94.2 94.0 90.6  PLT 337 301 300 233 221 836    Basic Metabolic Panel: Recent Labs  Lab 05/11/20 1542 05/12/20 0403 05/13/20 0114 05/15/20 0759  NA 134* 137 135 135  K 4.0 3.9 4.1 3.4*  CL 95* 106 104 98  CO2 22 20* 21* 23  GLUCOSE 153* 145* 133* 97  BUN 17 12 10 12   CREATININE 1.42* 1.03* 0.82 0.60  CALCIUM 9.5 7.9* 8.2* 8.6*   GFR: Estimated Creatinine Clearance: 71.5 mL/min (by C-G formula based on SCr of 0.6 mg/dL). Liver Function Tests: Recent Labs  Lab 05/12/20 0403 05/13/20 0114 05/15/20 0759  AST 17 36 98*  ALT 19 25 99*  ALKPHOS 76 72 113  BILITOT 0.4 0.6 0.4  PROT 5.6* 5.5* 6.0*  ALBUMIN 2.2* 2.1* 2.3*   No results for input(s): LIPASE, AMYLASE in the last 168 hours. No results for input(s): AMMONIA in the last 168 hours. Coagulation Profile: No results for input(s): INR, PROTIME in the last 168 hours. Cardiac Enzymes: No results for input(s): CKTOTAL, CKMB, CKMBINDEX, TROPONINI in the last 168 hours. BNP (last 3 results) No results for input(s): PROBNP in the last 8760 hours. HbA1C: No results for input(s): HGBA1C in the last 72 hours. CBG: Recent Labs  Lab 05/11/20 1635 05/11/20 2107 05/12/20 0551 05/12/20 1139 05/12/20 1703  GLUCAP 155* 113* 120* 111* 94   Lipid Profile: No results for input(s): CHOL, HDL, LDLCALC, TRIG, CHOLHDL, LDLDIRECT in the last 72 hours. Thyroid Function Tests: No results for input(s): TSH, T4TOTAL, FREET4, T3FREE, THYROIDAB in the last 72 hours. Anemia Panel: No results for input(s): VITAMINB12, FOLATE, FERRITIN, TIBC, IRON, RETICCTPCT in the last 72 hours. Sepsis Labs: Recent Labs  Lab 05/11/20 1542 05/11/20 1754 05/11/20 1941 05/12/20 0403 05/12/20 0950 05/12/20 1201  PROCALCITON 0.90  --   --  2.47  --   --   LATICACIDVEN  --    < > 3.2* 2.2* 1.8 1.7   < > = values in this interval not displayed.    Recent Results (from the past 240 hour(s))  Culture, Urine     Status: Abnormal   Collection Time: 05/10/20  5:39 PM   Specimen: Urine, Clean Catch   Result Value Ref Range Status   Specimen Description URINE, CLEAN CATCH  Final   Special Requests   Final    NONE Performed at  [facility], [place] [address] [place], [place] [address]    Culture (A)  Final    >=100,000 COLONIES/mL ESCHERICHIA COLI Confirmed Extended Spectrum Beta-Lactamase Producer (ESBL).  In bloodstream infections from ESBL organisms, carbapenems are preferred over piperacillin/tazobactam. They are shown to have a lower risk of mortality.    Report Status 05/12/2020 FINAL  Final   Organism ID, Bacteria ESCHERICHIA COLI (A)  Final      Susceptibility   Escherichia coli - MIC*    AMPICILLIN >=32 RESISTANT Resistant     CEFAZOLIN >=64 RESISTANT Resistant     CEFEPIME 16 RESISTANT Resistant     CEFTRIAXONE >=64 RESISTANT Resistant     CIPROFLOXACIN >=4 RESISTANT Resistant     GENTAMICIN >=16 RESISTANT Resistant     IMIPENEM <=0.25 SENSITIVE Sensitive     NITROFURANTOIN 128 RESISTANT Resistant     TRIMETH/SULFA >=320 RESISTANT Resistant     AMPICILLIN/SULBACTAM >=32 RESISTANT Resistant     PIP/TAZO 16 SENSITIVE Sensitive     * >=100,000 COLONIES/mL ESCHERICHIA COLI  Culture, blood (routine x 2)     Status: Abnormal   Collection Time: 05/11/20  5:37 PM   Specimen: BLOOD RIGHT HAND  Result Value Ref Range Status   Specimen Description BLOOD RIGHT HAND  Final   Special Requests   Final    BOTTLES DRAWN AEROBIC AND ANAEROBIC Blood Culture adequate volume   Culture  Setup Time   Final    GRAM NEGATIVE RODS IN BOTH AEROBIC AND ANAEROBIC BOTTLES CRITICAL RESULT CALLED TO, READ BACK BY AND VERIFIED WITH: [place] F. [phone] FCP Performed at [facility], [address] [place], [place] [address]    Culture (A)  Final    ESCHERICHIA COLI Confirmed Extended Spectrum Beta-Lactamase Producer (ESBL).  In bloodstream infections from ESBL organisms, carbapenems are preferred over piperacillin/tazobactam. They are shown to have a lower risk of  mortality.    Report Status 05/14/2020 FINAL  Final   Organism ID, Bacteria ESCHERICHIA COLI  Final      Susceptibility   Escherichia coli - MIC*    AMPICILLIN >=32 RESISTANT Resistant     CEFAZOLIN >=64 RESISTANT Resistant     CEFEPIME >=32 RESISTANT Resistant     CEFTAZIDIME >=64 RESISTANT Resistant     CEFTRIAXONE >=64 RESISTANT Resistant     CIPROFLOXACIN >=4 RESISTANT Resistant     GENTAMICIN >=16 RESISTANT Resistant     IMIPENEM <=0.25 SENSITIVE Sensitive     TRIMETH/SULFA >=320 RESISTANT Resistant     AMPICILLIN/SULBACTAM >=32 RESISTANT Resistant     PIP/TAZO 16 SENSITIVE Sensitive     * ESCHERICHIA COLI  Blood Culture ID Panel (Reflexed)     Status: Abnormal   Collection Time: 05/11/20  5:37 PM  Result Value Ref Range Status   Enterococcus faecalis NOT DETECTED NOT DETECTED Final   Enterococcus Faecium NOT DETECTED NOT DETECTED Final   Listeria monocytogenes NOT DETECTED NOT DETECTED Final   Staphylococcus species NOT DETECTED NOT DETECTED Final   Staphylococcus aureus (BCID) NOT DETECTED NOT DETECTED Final   Staphylococcus epidermidis NOT DETECTED NOT DETECTED Final   Staphylococcus lugdunensis NOT DETECTED NOT DETECTED Final   Streptococcus species NOT DETECTED NOT DETECTED Final   Streptococcus agalactiae NOT DETECTED NOT DETECTED Final   Streptococcus pneumoniae NOT DETECTED NOT DETECTED Final   Streptococcus pyogenes NOT DETECTED NOT DETECTED Final   A.calcoaceticus-baumannii NOT DETECTED NOT DETECTED Final   Bacteroides fragilis NOT DETECTED NOT DETECTED Final  Enterobacterales DETECTED (A) NOT DETECTED Final    Comment: Enterobacterales represent a large order of gram negative bacteria, not a single organism. CRITICAL RESULT CALLED TO, READ BACK BY AND VERIFIED WITH: PHARMD JEREMY F. [phone] FCP    Enterobacter cloacae complex NOT DETECTED NOT DETECTED Final   Escherichia coli DETECTED (A) NOT DETECTED Final    Comment: CRITICAL RESULT CALLED TO, READ BACK  BY AND VERIFIED WITH: PHARMD JEREMY F. [id number] FCP    Klebsiella aerogenes NOT DETECTED NOT DETECTED Final   Klebsiella oxytoca NOT DETECTED NOT DETECTED Final   Klebsiella pneumoniae NOT DETECTED NOT DETECTED Final   Proteus species NOT DETECTED NOT DETECTED Final   Salmonella species NOT DETECTED NOT DETECTED Final   Serratia marcescens NOT DETECTED NOT DETECTED Final   Haemophilus influenzae NOT DETECTED NOT DETECTED Final   Neisseria meningitidis NOT DETECTED NOT DETECTED Final   Pseudomonas aeruginosa NOT DETECTED NOT DETECTED Final   Stenotrophomonas maltophilia NOT DETECTED NOT DETECTED Final   Candida albicans NOT DETECTED NOT DETECTED Final   Candida auris NOT DETECTED NOT DETECTED Final   Candida glabrata NOT DETECTED NOT DETECTED Final   Candida krusei NOT DETECTED NOT DETECTED Final   Candida parapsilosis NOT DETECTED NOT DETECTED Final   Candida tropicalis NOT DETECTED NOT DETECTED Final   Cryptococcus neoformans/gattii NOT DETECTED NOT DETECTED Final   CTX-M ESBL DETECTED (A) NOT DETECTED Final    Comment: CRITICAL RESULT CALLED TO, READ BACK BY AND VERIFIED WITH: PHARMD JEREMY F. [phone] FCP (NOTE) Extended spectrum beta-lactamase detected. Recommend a carbapenem as initial therapy.      Carbapenem resistance IMP NOT DETECTED NOT DETECTED Final   Carbapenem resistance KPC NOT DETECTED NOT DETECTED Final   Carbapenem resistance NDM NOT DETECTED NOT DETECTED Final   Carbapenem resist OXA 48 LIKE NOT DETECTED NOT DETECTED Final   Carbapenem resistance VIM NOT DETECTED NOT DETECTED Final    Comment: Performed at [facility], [place] [address] [place], [place] [address]  Culture, blood (routine x 2)     Status: Abnormal   Collection Time: 05/11/20  5:51 PM   Specimen: BLOOD  Result Value Ref Range Status   Specimen Description BLOOD RIGHT ANTECUBITAL  Final   Special Requests   Final    BOTTLES DRAWN AEROBIC AND ANAEROBIC Blood Culture adequate volume    Culture  Setup Time   Final    GRAM NEGATIVE RODS IN BOTH AEROBIC AND ANAEROBIC BOTTLES CRITICAL VALUE NOTED.  VALUE IS CONSISTENT WITH PREVIOUSLY REPORTED AND CALLED VALUE.    Culture (A)  Final    ESCHERICHIA COLI SUSCEPTIBILITIES PERFORMED ON PREVIOUS CULTURE WITHIN THE LAST 5 DAYS. Performed at [facility], [place] [address], [place], [place] [address]    Report Status 05/14/2020 FINAL  Final  Resp panel by RT-PCR (RSV, Flu A&B, Covid) Nasopharyngeal Swab     Status: None   Collection Time: 05/12/20  6:06 PM   Specimen: Nasopharyngeal Swab; Nasopharyngeal(NP) swabs in vial transport medium  Result Value Ref Range Status   SARS Coronavirus 2 by RT PCR NEGATIVE NEGATIVE Final    Comment: (NOTE) SARS-CoV-2 target nucleic acids are NOT DETECTED.  The SARS-CoV-2 RNA is generally detectable in upper respiratory specimens during the acute phase of infection. The lowest concentration of SARS-CoV-2 viral copies this assay can detect is 138 copies/mL. A negative result does not preclude SARS-Cov-2 infection and should not be used as the sole basis for treatment or other  patient management decisions. A negative result may occur with  improper specimen collection/handling, submission of specimen other than nasopharyngeal swab, presence of viral mutation(s) within the areas targeted by this assay, and inadequate number of viral copies(<138 copies/mL). A negative result must be combined with clinical observations, patient history, and epidemiological information. The expected result is Negative.  Fact Sheet for Patients:  EntrepreneurPulse.com.au  Fact Sheet for Healthcare Providers:  IncredibleEmployment.be  This test is no t yet approved or cleared by the [place] FDA and  has been authorized for detection and/or diagnosis of SARS-CoV-2 by FDA under an Emergency Use Authorization (EUA). This EUA will remain  in effect (meaning this test  can be used) for the duration of the COVID-19 declaration under Section 564(b)(1) of the Act, 21 U.S.C.section 360bbb-3(b)(1), unless the authorization is terminated  or revoked sooner.       Influenza A by PCR NEGATIVE NEGATIVE Final   Influenza B by PCR NEGATIVE NEGATIVE Final    Comment: (NOTE) The Xpert Xpress SARS-CoV-2/FLU/RSV plus assay is intended as an aid in the diagnosis of influenza from Nasopharyngeal swab specimens and should not be used as a sole basis for treatment. Nasal washings and aspirates are unacceptable for Xpert Xpress SARS-CoV-2/FLU/RSV testing.  Fact Sheet for Patients: EntrepreneurPulse.com.au  Fact Sheet for Healthcare Providers: IncredibleEmployment.be  This test is not yet approved or cleared by the [place] FDA and has been authorized for detection and/or diagnosis of SARS-CoV-2 by FDA under an Emergency Use Authorization (EUA). This EUA will remain in effect (meaning this test can be used) for the duration of the COVID-19 declaration under Section 564(b)(1) of the Act, 21 U.S.C. section 360bbb-3(b)(1), unless the authorization is terminated or revoked.     Resp Syncytial Virus by PCR NEGATIVE NEGATIVE Final    Comment: (NOTE) Fact Sheet for Patients: EntrepreneurPulse.com.au  Fact Sheet for Healthcare Providers: IncredibleEmployment.be  This test is not yet approved or cleared by the [place] FDA and has been authorized for detection and/or diagnosis of SARS-CoV-2 by FDA under an Emergency Use Authorization (EUA). This EUA will remain in effect (meaning this test can be used) for the duration of the COVID-19 declaration under Section 564(b)(1) of the Act, 21 U.S.C. section 360bbb-3(b)(1), unless the authorization is terminated or revoked.  Performed at [facility], [place] [address], [place], [place] [address]      Radiology Studies: DG Abd 1  View  Result Date: 05/13/2020 CLINICAL DATA:  Abdominal distension. EXAM: ABDOMEN - 1 VIEW COMPARISON:  May 10, 2020. FINDINGS: The bowel gas pattern is normal. No radio-opaque calculi or other significant radiographic abnormality are seen. IMPRESSION: Negative. Electronically Signed   By: Marijo Conception M.D.   On: 05/13/2020 15:55     LOS: 3 days   Antonieta Pert, MD [facility]  05/15/2020, 10:10 AM

## 2020-05-15 NOTE — Progress Notes (Signed)
Inpatient Rehabilitation Admissions Coordinator  I met with patient at bedside and then contacted her sister, [name], by phone. Patient continues with nausea and currently not able to work with therapy due to the nausea. We will await patient's ability to progress with therapy to hopefully readmit her to [facility] when she is able. Family continues to encourage her to the best of her abilities.I will follow.  Danne Baxter, RN, MSN Rehab Admissions Coordinator [phone] 05/15/2020 12:32 PM

## 2020-05-15 NOTE — Progress Notes (Signed)
PT Cancellation Note  Patient Details Name: Maria [name] MRN: [id number] DOB: November 30, 1960   Cancelled Treatment:    Reason Eval/Treat Not Completed: Medical issues which prohibited therapy (Pt refused due to nausea.  Nurse gave meds.)   Denice Paradise 05/15/2020, 11:14 AM Lucero Ide W,PT Acute Rehabilitation Services Pager:  [phone]  Office:  [phone]

## 2020-05-15 NOTE — Progress Notes (Signed)
Inpatient Rehabilitation Care Coordinator  Discharge Note  The overall goal for the admission was met for:   Discharge location: Pt discharged to acute hospital due to medical reasons.   Length of Stay:  7 days  Discharge activity level: Yes. Min A  Home/community participation: Yes  Services provided included: MD, RD, PT, OT, SLP, RN, CM, TR, Pharmacy, Neuropsych and SW  Financial Services: Medicare  Follow-up services arranged: N/A  Comments (or additional information):  Patient/Family verbalized understanding of follow-up arrangements: Yes  Individual responsible for coordination of the follow-up plan: N/A  Confirmed correct DME delivered: Rana Snare 05/15/2020    Rana Snare

## 2020-05-15 NOTE — Progress Notes (Addendum)
[facility] Disease  Date of Admission:  05/12/2020           Current antibiotics: Day 2 ertapenem 12/12--pres  Previous antibiotics: Meropenem 12/10--12/12 Ceftriaxone 11/12--11/25, 12/9 x 1 dose Cephalexin 12/9 x 1 dose Piperacillin tazobactam 11/14 Azithromycin 11/14 Fluconazole 11/21  Reason for visit: Follow up on ESBL E coli bacteremia/pyleonephritis/renal abscess  Interval events: No acute events noted overnight Afebrile, T-max 98.7 WBC improved New LFT increase from yesterday 12/9 blood cultures 4 out of 4 positive for ESBL E. coli Antibiotics transitioned to ertapenem over the weekend CT scan showed new small renal abscess approximately 12 mm.  Seen by urology.  Not amenable to drainage.  ASSESSMENT:    #ESBL E. coli bacteremia/pyelonephritis/renal abscess Most recent imaging shows evolving 12 mm likely abscess.  Previous left renal abscess seen earlier this admission in November has now resolved.  Urology evaluated her over the weekend and there was no distinct fluid collection that looked amenable to drainage on CT.  She has clinically responded to carbapenem therapy for her ESBL organism  #History of seizures  #Elevated LFTs  PLAN:    --Continue ertapenem --Will ask IR if the renal abscess is amenable to drainage procedure --Monitor for further seizure activity while on carbapenem --Monitor LFTs --Check ESR and CRP  MEDICATIONS:    Scheduled Meds: . atorvastatin  20 mg Oral QPM  . benzonatate  200 mg Oral TID  . chlorhexidine  15 mL Mouth Rinse BID  . clonazepam  0.25 mg Oral BID  . docusate sodium  100 mg Oral BID  . enoxaparin (LOVENOX) injection  40 mg Subcutaneous Q24H  . feeding supplement  237 mL Oral TID BM  . mouth rinse  15 mL Mouth Rinse q12n4p  . metoprolol tartrate  25 mg Oral BID  . mometasone-formoterol  2 puff Inhalation BID  . multivitamin with minerals  1 tablet Oral Daily  . pantoprazole (PROTONIX) IV   40 mg Intravenous Q24H  . QUEtiapine  25 mg Oral BID  . sodium chloride flush  3 mL Intravenous Q12H    Continuous Infusions: . ertapenem 200 mL/hr at 05/14/20 1719  . lacosamide (VIMPAT) IV 200 mg (05/14/20 2250)  . valproate sodium 500 mg (05/15/20 0930)    PRN Meds: acetaminophen, acetaminophen, alum & mag hydroxide-simeth, bisacodyl, ipratropium-albuterol, lip balm, LORazepam, melatonin, phenol, polyethylene glycol  SUBJECTIVE:   No acute events.  No fevers.  Reports left sided abdominal pain.  No CVA tenderness.  Also reports nausea.   Review of Systems: As noted above.  All other systems reviewed and are negative.   OBJECTIVE:   Allergies  Allergen Reactions  . Codeine Nausea And Vomiting  . Lamictal [Lamotrigine] Rash    Blood pressure (!) 161/81, pulse 92, temperature 98.3 F (36.8 C), temperature source Oral, resp. rate 14, weight 59.1 kg, SpO2 98 %. Body mass index is 21.03 kg/m.  Physical Exam Constitutional:      Comments: Chronically ill-appearing woman, lying in bed, no acute distress  HENT:     Head: Normocephalic and atraumatic.  Eyes:     General: No scleral icterus.    Extraocular Movements: Extraocular movements intact.  Cardiovascular:     Rate and Rhythm: Normal rate and regular rhythm.  Pulmonary:     Effort: Pulmonary effort is normal. No respiratory distress.  Abdominal:     General: There is no distension.     Palpations: Abdomen is soft.  Tenderness: There is no abdominal tenderness. There is no right CVA tenderness, left CVA tenderness, guarding or rebound.  Neurological:     General: No focal deficit present.       Lab Results & Microbiology Lab Results  Component Value Date   WBC 5.2 05/15/2020   HGB 8.8 (L) 05/15/2020   HCT 27.0 (L) 05/15/2020   MCV 90.6 05/15/2020   PLT 217 05/15/2020    Lab Results  Component Value Date   NA 135 05/15/2020   K 3.4 (L) 05/15/2020   CO2 23 05/15/2020   GLUCOSE 97 05/15/2020   BUN  12 05/15/2020   CREATININE 0.60 05/15/2020   CALCIUM 8.6 (L) 05/15/2020   GFRNONAA >60 05/15/2020   GFRAA >60 06/11/2017    Lab Results  Component Value Date   ALT 99 (H) 05/15/2020   AST 98 (H) 05/15/2020   ALKPHOS 113 05/15/2020   BILITOT 0.4 05/15/2020     I have reviewed the micro and lab results in Epic.  Imaging DG Abd 1 View  Result Date: 05/13/2020 CLINICAL DATA:  Abdominal distension. EXAM: ABDOMEN - 1 VIEW COMPARISON:  May 10, 2020. FINDINGS: The bowel gas pattern is normal. No radio-opaque calculi or other significant radiographic abnormality are seen. IMPRESSION: Negative. Electronically Signed   By: Marijo Conception M.D.   On: 05/13/2020 15:55      [facility] [phone] pager 05/15/2020, 9:54 AM  I have spent a total of 25 minutes with the patient reviewing hospital notes,  test results, labs and examining the patient as well as establishing an assessment and plan.

## 2020-05-16 DIAGNOSIS — R110 Nausea: Secondary | ICD-10-CM

## 2020-05-16 LAB — COMPREHENSIVE METABOLIC PANEL
ALT: 105 U/L — ABNORMAL HIGH (ref 0–44)
AST: 89 U/L — ABNORMAL HIGH (ref 15–41)
Albumin: 2.3 g/dL — ABNORMAL LOW (ref 3.5–5.0)
Alkaline Phosphatase: 124 U/L (ref 38–126)
Anion gap: 10 (ref 5–15)
BUN: 10 mg/dL (ref 6–20)
CO2: 25 mmol/L (ref 22–32)
Calcium: 8.8 mg/dL — ABNORMAL LOW (ref 8.9–10.3)
Chloride: 100 mmol/L (ref 98–111)
Creatinine, Ser: 0.75 mg/dL (ref 0.44–1.00)
GFR, Estimated: >60 mL/min (ref >60)
Glucose, Bld: 112 mg/dL — ABNORMAL HIGH (ref 70–99)
Potassium: 3.9 mmol/L (ref 3.5–5.1)
Sodium: 135 mmol/L (ref 135–145)
Total Bilirubin: 0.4 mg/dL (ref 0.3–1.2)
Total Protein: 6.3 g/dL — ABNORMAL LOW (ref 6.5–8.1)

## 2020-05-16 LAB — CBC
HCT: 26.1 % — ABNORMAL LOW (ref 36.0–46.0)
Hemoglobin: 8.5 g/dL — ABNORMAL LOW (ref 12.0–15.0)
MCH: 29.3 pg (ref 26.0–34.0)
MCHC: 32.6 g/dL (ref 30.0–36.0)
MCV: 90 fL (ref 80.0–100.0)
Platelets: 231 10*3/uL (ref 150–400)
RBC: 2.9 MIL/uL — ABNORMAL LOW (ref 3.87–5.11)
RDW: 15.2 % (ref 11.5–15.5)
WBC: 6.5 10*3/uL (ref 4.0–10.5)
nRBC: 0 % (ref 0.0–0.2)

## 2020-05-16 MED ORDER — PANTOPRAZOLE SODIUM 40 MG PO TBEC
40.0000 mg | DELAYED_RELEASE_TABLET | Freq: Every day | ORAL | Status: DC
  Administered 2020-05-16: 22:00:00 40 mg via ORAL
  Filled 2020-05-16 (×2): qty 1

## 2020-05-16 MED ORDER — LACOSAMIDE 50 MG PO TABS
200.0000 mg | ORAL_TABLET | Freq: Two times a day (BID) | ORAL | Status: DC
  Administered 2020-05-16 – 2020-05-18 (×3): 200 mg via ORAL
  Filled 2020-05-16 (×3): qty 4

## 2020-05-16 NOTE — Progress Notes (Signed)
This RN was called to patients room. Patient appeared to be having a focal seizure. Vital signs stable. MD and rapid response called.  Era Bumpers, RN

## 2020-05-16 NOTE — Progress Notes (Signed)
Physical Therapy Treatment Patient Details Name: Maria [name] MRN: [id number] DOB: 03-04-1961 Today's Date: 05/16/2020    History of Present Illness 59 yo admitted with sepsis, pyelonephritis and encephalopathy. 11/18 Sz with continuous EEG. PMhx: epilepsy, intellectual disability, GERD.  Readmitted to [facility] from CIR.    PT Comments    Pt supine in bed on arrival this session.  Pt required encouragement to participate in PT session but once educated she was quite agreeable.  Performed LE exercises and gt training in room.  Plan for aggressive rehab in a post acute setting to maximize functional gains before returning home.     Follow Up Recommendations  CIR     Equipment Recommendations  None recommended by PT    Recommendations for Other Services       Precautions / Restrictions Precautions Precautions: Fall Precaution Comments: Contact Precautions. Restrictions Weight Bearing Restrictions: No    Mobility  Bed Mobility Overal bed mobility: Needs Assistance Bed Mobility: Supine to Sit;Sit to Supine Rolling: Min assist Sidelying to sit: Mod assist Supine to sit: HOB elevated;Min assist Sit to supine: Min guard   General bed mobility comments: Pt required assistance to move to edge of bed and move back to bed.  Pt required assistance to move to [facility].  Transfers Overall transfer level: Needs assistance Equipment used: Rolling walker (2 wheeled) Transfers: Sit to/from Stand Sit to Stand: Min assist Stand pivot transfers: Max assist       General transfer comment: Cues for hand placement to push from seated surface to rise into standing.  Ambulation/Gait Ambulation/Gait assistance: Mod assist Gait Distance (Feet): 16 Feet Assistive device: Rolling walker (2 wheeled) Gait Pattern/deviations: Trunk flexed;Step-to pattern;Decreased step length - right;Decreased step length - left Gait velocity: decreased   General Gait Details: Pt required cues for safety and  assistance to manage RW position.  Pt with noted weakness but no overt LOB.   Stairs             Wheelchair Mobility    Modified Rankin (Stroke Patients Only)       Balance Overall balance assessment: Needs assistance Sitting-balance support: Feet supported Sitting balance-Leahy Scale: Fair     Standing balance support: Bilateral upper extremity supported Standing balance-Leahy Scale: Poor                              Cognition Arousal/Alertness: Lethargic Behavior During Therapy: Flat affect;Anxious;Impulsive Overall Cognitive Status: History of cognitive impairments - at baseline Area of Impairment: Problem solving;Awareness;Safety/judgement;Following commands;Memory;Attention                 Orientation Level: Situation Current Attention Level: Selective Memory: Decreased short-term memory;Decreased recall of precautions Following Commands: Follows one step commands inconsistently;Follows one step commands with increased time Safety/Judgement: Decreased awareness of deficits;Decreased awareness of safety Awareness: Intellectual Problem Solving: Slow processing;Requires verbal cues;Requires tactile cues;Decreased initiation;Difficulty sequencing General Comments: reported need to go to bathroom, but required step by step commands with minimal follow through noted, significant deficits in safety noted      Exercises General Exercises - Lower Extremity Long Arc Quad: AROM;Both;10 reps;Seated Heel Slides: AROM;Both;10 reps;Supine Straight Leg Raises: AROM;Both;10 reps;Supine Hip Flexion/Marching: AROM;Both;10 reps;Seated    General Comments        Pertinent Vitals/Pain Pain Assessment: No/denies pain Faces Pain Scale: Hurts a little bit Pain Location: generalized with mobility, unable to localize Pain Descriptors / Indicators: Grimacing;Guarding Pain Intervention(s): Monitored during session;Repositioned  Home Living                       Prior Function            PT Goals (current goals can now be found in the care plan section) Acute Rehab PT Goals Patient Stated Goal: None stated Potential to Achieve Goals: Good Progress towards PT goals: Progressing toward goals    Frequency    Min 3X/week      PT Plan Current plan remains appropriate    Co-evaluation              AM-PAC PT "6 Clicks" Mobility   Outcome Measure  Help needed turning from your back to your side while in a flat bed without using bedrails?: A Little Help needed moving from lying on your back to sitting on the side of a flat bed without using bedrails?: A Little Help needed moving to and from a bed to a chair (including a wheelchair)?: A Little Help needed standing up from a chair using your arms (e.g., wheelchair or bedside chair)?: A Little Help needed to walk in hospital room?: A Lot Help needed climbing 3-5 steps with a railing? : Total 6 Click Score: 15    End of Session Equipment Utilized During Treatment: Gait belt Activity Tolerance: Patient limited by fatigue;Treatment limited secondary to medical complications (Comment) Patient left: in bed;with call bell/phone within reach;with bed alarm set Nurse Communication: Mobility status PT Visit Diagnosis: Muscle weakness (generalized) (M62.81);Difficulty in walking, not elsewhere classified (R26.2);Adult, failure to thrive (R62.7)     Time: 6440-3474 PT Time Calculation (min) (ACUTE ONLY): 17 min  Charges:  $Gait Training: 8-22 mins                     Erasmo Leventhal , PTA Acute Rehabilitation Services Pager [phone] Office [phone]     Arya Luttrull Eli Hose 05/16/2020, 3:28 PM

## 2020-05-16 NOTE — Significant Event (Signed)
Rapid Response Event Note   Reason for Call :  Focal seizure  Initial Focused Assessment:  Upon my arrival patient has returned to her neuro baseline.  RN describes a postictal phase after seizure.  She has been readmitted from Rehab a couple days ago for sepsis and has been on antibiotics.  BP 160/79  SR 78  RR 17  O2 sat 98%    Interventions:  MD consulting neurology  Plan of Care:  RN to call if patient has additional seizures    Event Summary:   MD Notified: Antonieta Pert Call Time: [place] Time: 1818 End Time: [place]  Raliegh Ip, RN

## 2020-05-16 NOTE — Care Management Important Message (Signed)
Important Message  Patient Details  Name: Maria [name] MRN: [id number] Date of Birth: [birth year]   Medicare Important Message Given:  Yes - Important Message mailed due to current National Emergency   Verbal consent obtained due to current National Emergency  Relationship to patient: Self Contact Name: Anai Lipson Call Date: 05/16/20  Time: 1259 Phone: [phone] Outcome: No Answer/Busy Important Message mailed to: Patient address on file    Delorse Lek 05/16/2020, 1:00 PM

## 2020-05-16 NOTE — Progress Notes (Signed)
Occupational Therapy Treatment Patient Details Name: Maria [name] MRN: [id number] DOB: [birth year] Today's Date: 05/16/2020    History of present illness 59 yo admitted with sepsis, pyelonephritis and encephalopathy. 11/18 Sz with continuous EEG. PMhx: epilepsy, intellectual disability, GERD.  Readmitted to [facility] from CIR.   OT comments  Patient making minimal progress towards goals in skilled OT session. Patient's session encompassed transfer to [facility] and back to bed. Pt noted to require increased assist to complete all tasks to date, with three losses of balance in the session. Pt also demonstrating difficulty following one step commands, and significant lapses in safety awareness. Therapist unable to progress further after transfer to [facility] due to significant safety risk therefore stand pivot with max A completed back to bed. Therapy will continue to follow.    Follow Up Recommendations  Supervision/Assistance - 24 hour;CIR    Equipment Recommendations  Other (comment) (Defer to next venue)    Recommendations for Other Services      Precautions / Restrictions Precautions Precautions: Fall Precaution Comments: Contact Precautions. Restrictions Weight Bearing Restrictions: No       Mobility Bed Mobility Overal bed mobility: Needs Assistance Bed Mobility: Rolling;Supine to Sit;Sit to Supine Rolling: Min assist Sidelying to sit: Mod assist Supine to sit: HOB elevated;Min guard     General bed mobility comments: required mod to get to EOB, but able to complete return to bed with min gaurd before therapist prompting (lines and purewick underneath requiring increased rolling)  Transfers Overall transfer level: Needs assistance Equipment used: Rolling walker (2 wheeled);1 person hand held assist Transfers: Sit to/from [facility] Sit to Stand: Mod assist Stand pivot transfers: Max assist       General transfer comment: LOB x3, would need close chair follow for  further mobility to progress safely    Balance Overall balance assessment: Needs assistance Sitting-balance support: Feet supported Sitting balance-Leahy Scale: Fair     Standing balance support: Bilateral upper extremity supported Standing balance-Leahy Scale: Poor                             ADL either performed or assessed with clinical judgement   ADL Overall ADL's : Needs assistance/impaired     Grooming: Wash/dry face;Min guard;Sitting;Wash/dry Teacher, music: Maximal assistance;Cueing for safety;Cueing for sequencing;Stand-pivot;BSC Toilet Transfer Details (indicate cue type and reason): LOB x2 to get to [facility], requiring max A in order to safely transfer Toileting- Clothing Manipulation and Hygiene: Total assistance Toileting - Clothing Manipulation Details (indicate cue type and reason): due to lack of balance in standing LOB x1     Functional mobility during ADLs: Maximal assistance;Moderate assistance;Cueing for safety;Cueing for sequencing;Rolling walker General ADL Comments: Pt max A for transfer to [facility], and mod A to return with RW, pt not following commands, short sitting and having LOB x3 when attempting to get to [facility] (sitting back on bed, sitting halfway on commode, and losing balance coming into standing from [facility] to complete peri-care)     Vision       Perception     Praxis      Cognition Arousal/Alertness: Lethargic Behavior During Therapy: Flat affect;Anxious;Impulsive Overall Cognitive Status: History of cognitive impairments - at baseline Area of Impairment: Problem solving;Awareness;Safety/judgement;Following commands;Memory;Attention;Orientation                 Orientation  Level: Situation Current Attention Level: Selective Memory: Decreased short-term memory;Decreased recall of precautions Following Commands: Follows one step commands inconsistently;Follows one step commands with increased  time Safety/Judgement: Decreased awareness of deficits;Decreased awareness of safety Awareness: Intellectual Problem Solving: Slow processing;Requires verbal cues;Requires tactile cues;Decreased initiation;Difficulty sequencing General Comments: reported need to go to bathroom, but required step by step commands with minimal follow through noted, significant deficits in safety noted        Exercises     Shoulder Instructions       General Comments      Pertinent Vitals/ Pain       Pain Assessment: Faces Faces Pain Scale: Hurts a little bit Pain Location: generalized with mobility, unable to localize Pain Descriptors / Indicators: Grimacing;Guarding Pain Intervention(s): Limited activity within patient's tolerance;Monitored during session;Repositioned  Home Living                                          Prior Functioning/Environment              Frequency  Min 2X/week        Progress Toward Goals  OT Goals(current goals can now be found in the care plan section)  Progress towards OT goals: Progressing toward goals  Acute Rehab OT Goals Patient Stated Goal: None stated OT Goal Formulation: Patient unable to participate in goal setting Potential to Achieve Goals: [place] Discharge plan remains appropriate    Co-evaluation                 AM-PAC OT "6 Clicks" Daily Activity     Outcome Measure   Help from another person eating meals?: A Little Help from another person taking care of personal grooming?: A Little Help from another person toileting, which includes using toliet, bedpan, or urinal?: Total Help from another person bathing (including washing, rinsing, drying)?: A Lot Help from another person to put on and taking off regular upper body clothing?: A Little Help from another person to put on and taking off regular lower body clothing?: A Lot 6 Click Score: 14    End of Session Equipment Utilized During Treatment: Rolling  walker;Gait belt  OT Visit Diagnosis: Unsteadiness on feet (R26.81);Other abnormalities of gait and mobility (R26.89);Muscle weakness (generalized) (M62.81);Other symptoms and signs involving cognitive function   Activity Tolerance Patient limited by fatigue   Patient Left in bed;with call bell/phone within reach;with bed alarm set   Nurse Communication Mobility status        Time: 1610-9604 OT Time Calculation (min): 23 min  Charges: OT General Charges $OT Visit: 1 Visit OT Treatments $Self Care/Home Management : 23-37 mins  [place]. Cristalle Rohm, COTA/L Acute Rehabilitation Services [place] 05/16/2020, 12:54 PM

## 2020-05-16 NOTE — Progress Notes (Signed)
PHARMACIST - PHYSICIAN COMMUNICATION   CONCERNING: IV to Oral Route Change Policy  RECOMMENDATION: This patient is receiving protonix by the intravenous route.  Based on criteria approved by the Pharmacy and Therapeutics Committee, the intravenous medication(s) is/are being converted to the equivalent oral dose form(s).   DESCRIPTION: These criteria include:  The patient is eating (either orally or via tube) and/or has been taking other orally administered medications for a least 24 hours  The patient has no evidence of active gastrointestinal bleeding or impaired GI absorption (gastrectomy, short bowel, patient on TNA or NPO).  If you have questions about this conversion, please contact the Pharmacy Department  []  ( [phone] )  [place] []  ( [phone] )  [facility] [x]  ( [phone] )  [place] []  ( [phone] )  [facility] []  ( [phone] )  [facility]    Ferlando Lia, PharmD Clinical Pharmacist **Pharmacist phone directory can now be found on [email or web address] ([email or web address]).  Listed under [facility].     

## 2020-05-16 NOTE — Progress Notes (Signed)
[facility] for Infectious Disease  Date of Admission:  05/12/2020           Current antibiotics: Day 3 ertapenem 12/12--pres  Previous antibiotics: Meropenem 12/10--12/12 Ceftriaxone 11/12--11/25,12/9 x 1 dose Cephalexin 12/9 x 1 dose Piperacillin tazobactam 11/14 Azithromycin 11/14 Fluconazole 11/21  Reason for visit: Follow up on ESBL E coli bacteremia/pyleonephritis/renal abscess  Interval events: No acute events noted Afebrile Stable WBC Sed Rate = 95 CRP = 4.1 C/w ertapenem D/w IR ---> no drainable collection at this time LFTs stable   ASSESSMENT:    #ESBL E. coli bacteremia/pyelonephritis/renal abscess Most recent imaging 05/12/20 shows evolving 12 mm likely abscess.  Previous left renal abscess seen earlier this admission in November has now resolved.    Urology evaluated her 05/13/20 and there was no distinct fluid collection that looked amenable to drainage on CT.  Also discussed with IR who concurred regarding this on 05/15/20.  Clinically seems to be responding to antibiotic therapy.  #History of seizures  #Elevated LFTs  #Nausea   PLAN:    -- Continue ertapenem -- Check weekly ESR/CRP (order placed from next week) -- Would repeat CT scan in about 2 weeks around 05/26/20 to reassess renal abscess that was not drained to guide duration of therapy.  Anticipate she will still be at inpatient rehab around this time -- Will sign off for the time being.  Please call back after CT results or if discharging prior to this  MEDICATIONS:    Scheduled Meds: . atorvastatin  20 mg Oral QPM  . benzonatate  200 mg Oral TID  . chlorhexidine  15 mL Mouth Rinse BID  . clonazepam  0.25 mg Oral BID  . divalproex  500 mg Oral Q12H  . docusate sodium  100 mg Oral BID  . enoxaparin (LOVENOX) injection  40 mg Subcutaneous Q24H  . feeding supplement  237 mL Oral TID BM  . mouth rinse  15 mL Mouth Rinse q12n4p  . mouth rinse  15 mL Mouth Rinse q12n4p  .  metoprolol tartrate  50 mg Oral BID  . mometasone-formoterol  2 puff Inhalation BID  . multivitamin with minerals  1 tablet Oral Daily  . pantoprazole  40 mg Oral QHS  . QUEtiapine  25 mg Oral BID  . sodium chloride flush  3 mL Intravenous Q12H    Continuous Infusions: . ertapenem 1,000 mg (05/15/20 1710)  . lacosamide (VIMPAT) IV 200 mg (05/15/20 2249)    PRN Meds: acetaminophen, acetaminophen, alum & mag hydroxide-simeth, bisacodyl, ipratropium-albuterol, lip balm, LORazepam, melatonin, metoCLOPramide (REGLAN) injection, ondansetron (ZOFRAN) IV, phenol, polyethylene glycol  SUBJECTIVE:   No acute complaints.  Wants her mom to visit.  Does not have much appetite.  Still having nausea.   Review of Systems: As noted above.  All other systems reviewed and are negative.   OBJECTIVE:   Allergies  Allergen Reactions  . Codeine Nausea And Vomiting  . Lamictal [Lamotrigine] Rash    Blood pressure (!) 150/75, pulse 79, temperature 98.8 F (37.1 C), temperature source Oral, resp. rate (!) 21, height 5' 6"  (1.676 m), weight 59.2 kg, SpO2 97 %. Body mass index is 21.07 kg/m.  Physical Exam Constitutional:      General: She is not in acute distress. HENT:     Head: Normocephalic and atraumatic.  Eyes:     General: No scleral icterus.    Extraocular Movements: Extraocular movements intact.  Pulmonary:     Effort: Pulmonary effort  is normal. No respiratory distress.  Abdominal:     General: Abdomen is flat.     Palpations: Abdomen is soft.     Tenderness: There is abdominal tenderness.  Musculoskeletal:     Cervical back: Normal range of motion and neck supple.  Skin:    General: Skin is warm and dry.     Findings: No rash.  Neurological:     General: No focal deficit present.     Mental Status: She is alert. Mental status is at baseline.      Lab Results & Microbiology Lab Results  Component Value Date   WBC 6.5 05/16/2020   HGB 8.5 (L) 05/16/2020   HCT 26.1 (L)  05/16/2020   MCV 90.0 05/16/2020   PLT 231 05/16/2020    Lab Results  Component Value Date   NA 135 05/16/2020   K 3.9 05/16/2020   CO2 25 05/16/2020   GLUCOSE 112 (H) 05/16/2020   BUN 10 05/16/2020   CREATININE 0.75 05/16/2020   CALCIUM 8.8 (L) 05/16/2020   GFRNONAA >60 05/16/2020   GFRAA >60 06/11/2017    Lab Results  Component Value Date   ALT 105 (H) 05/16/2020   AST 89 (H) 05/16/2020   ALKPHOS 124 05/16/2020   BILITOT 0.4 05/16/2020     I have reviewed the micro and lab results in Epic.  Imaging No results found.      Raynelle Highland for Infectious Disease [facility] [phone] pager 05/16/2020, 8:32 AM   I have spent a total of 25 minutes with the patient reviewing hospital notes,  test results, labs and examining the patient as well as establishing an assessment and plan.

## 2020-05-16 NOTE — Progress Notes (Signed)
PROGRESS NOTE    Maria [name]  IRS:[id number] DOB: 1961-01-20 DOA: 05/12/2020 PCP: Celene Squibb, MD   Brief Narrative: 59 year old female with significant history of intellectual disability/seizure disorder, chronic diastolic CHF, GERD, chronic constipation was seen by [facility] in consultation on 05/11/20 for new onset of fever and abnormal UA, further work-up revealed severe sepsis due to ESBL E. coli bacteremia and UTI, patient was transferred from [facility] progressive care return 12/10 for further management, was seen by PCCM, ID. Originally admitted at [facility] 04/14/2020 with septic shock requiring vasopressors presumed to be urinary source and transferred to [facility] ICU, CIN suspicious for pyelonephritis and small abscess in the left lower pole kidney, ultrasound 11 days later showed stable lesion felt to be representing a small perinephric abscess versus renal cyst, blood cultures drawn and negative but urine culture grew coli, not ESBL.  She completed a 14 days course of ceftriaxone.  Hospital course complicated by breakthrough seizures delirium agitation requiring Precedex, urology did not feel the abscess required any further intervention.  She was transferred subsequently to CIR on 05/05/2020 and was doing well until 05/11/2019 when she developed fever and abnormal UA suggestive of UTI and urine culture grew E. coli, and [facility] was consulted 12/9. Patient slowly improved with IV meropenem, being followed by ID and IR and urology.  Subjective:  Mild nausea this morning per nursing staff. Afebrile overnight. Has had nausea episodes yesterday, overnight no acute events, afebrile Labs stable w/ normal WBC count.  Assessment & Plan:  Severe sepsis 2/2 ESBL E Coli bacteremia w/ pyelonephritis and left renal abscess: Sepsis patient has resolved.Responed well to meropenem and switched to invanz 12/21 as per ID-Recent imaging shows evolving 12 mm likely abscess, seen by neurology over the weekend and no  distinct fluid collection that looked amenable to drainage on CT. ID  Has discussed with IR on 12/13 and it is not amenable for drainage, plan is to continue on IV Invanz.  She needs CT scan in about 2 weeks around 05/26/2020 2 reassess the renal abscess that was not drained to guide duration of therapy-if patient gets discharged home prior to this please notify ID.  She can continue Invanz in  [place]. to check ESR/CRP next week. Monitor for further seizure activity while on Invanz.  Discussed with ID okay to discharge to CIR and ID will need to follow-up in CIR  Lethargy/Acute metabolic encephalopathy likely in the setting of sepsis: Much more alert and awake.  Klonopin dose was decreased over the weekend.  On Klonopin 0.25 mg BID and Seroquel 25 twice daily.  Minimize sedation continue PT OT and CIR placement once available.  Transaminitis AST ALT elevated -follow-up closely.  GERD w/ Nausea-multiple episodes of nausea 12/13 needing Zofran/Reglan.  On dysphagia 3 diet continue as tolerated.  Nausea improving.  Hypokalemia resolved  Intellectual disability: Chronic.  Continue supportive care  Seizure disorder: History of breakthrough seizures during prior hospitalization, continue current AED with Vimpat, valproate Klonopin.  Monitor for seizure activity while getting Carbapenem.   AKI -resolved Lactic acidosis resolved  Chronic diastolic CHF with EF 60 to 65% on echo 04/17/2020. Chest x-ray 12/10 stable.  Monitor for any signs of fluid overload.  Weight is stable down to 130 pounds previously was around 134-141 lb  GERD continue PPI  Anemia likely from chronic disease.  Hemoglobin is stable at 8.5 g.   Nutrition: Diet Order            DIET DYS 3 Room  service appropriate? No; Fluid consistency: Thin  Diet effective now                 Body mass index is 21.07 kg/m.  DVT prophylaxis: enoxaparin (LOVENOX) injection 40 mg Start: 05/13/20 1000 Code Status:   Code Status: Full Code   Family Communication: plan of care discussed with patient at bedside.  Discussed with the nursing staff and infectious disease. Called her mother's cell no answer.  CM updated for CIR dispo.  Status is: Inpatient Remains inpatient appropriate because:Ongoing diagnostic testing needed not appropriate for outpatient work up, Unsafe d/c plan and IV treatments appropriate due to intensity of illness or inability to take PO  Dispo: The patient is from: CIR              Anticipated d/c is to: CIR              Anticipated d/c date is: Once CIR bed evaluation              Patient currently medically stable. Consultants:see note  Urology, infectious disease. Procedures:see note  Culture/Microbiology    Component Value Date/Time   SDES BLOOD RIGHT ANTECUBITAL 05/11/2020 1751   SPECREQUEST  05/11/2020 1751    BOTTLES DRAWN AEROBIC AND ANAEROBIC Blood Culture adequate volume   CULT (A) 05/11/2020 1751    ESCHERICHIA COLI SUSCEPTIBILITIES PERFORMED ON PREVIOUS CULTURE WITHIN THE LAST 5 DAYS. Performed at [facility], [place] [address] [place], [place] [address]    REPTSTATUS 05/14/2020 FINAL 05/11/2020 1751    Other culture-see note  Medications: Scheduled Meds: . atorvastatin  20 mg Oral QPM  . benzonatate  200 mg Oral TID  . chlorhexidine  15 mL Mouth Rinse BID  . clonazepam  0.25 mg Oral BID  . divalproex  500 mg Oral Q12H  . docusate sodium  100 mg Oral BID  . enoxaparin (LOVENOX) injection  40 mg Subcutaneous Q24H  . feeding supplement  237 mL Oral TID BM  . mouth rinse  15 mL Mouth Rinse q12n4p  . mouth rinse  15 mL Mouth Rinse q12n4p  . metoprolol tartrate  50 mg Oral BID  . mometasone-formoterol  2 puff Inhalation BID  . multivitamin with minerals  1 tablet Oral Daily  . pantoprazole  40 mg Oral QHS  . QUEtiapine  25 mg Oral BID  . sodium chloride flush  3 mL Intravenous Q12H   Continuous Infusions: . ertapenem 1,000 mg (05/15/20 1710)  . lacosamide (VIMPAT) IV 200  mg (05/15/20 2249)    Antimicrobials: Anti-infectives (From admission, onward)   Start     Dose/Rate Route Frequency Ordered Stop   05/14/20 1600  ertapenem (INVANZ) 1,000 mg in sodium chloride 0.9 % 100 mL IVPB        1 g 200 mL/hr over 30 Minutes Intravenous Every 24 hours 05/14/20 1259     05/12/20 2200  meropenem (MERREM) 1 g in sodium chloride 0.9 % 100 mL IVPB  Status:  Discontinued        1 g 200 mL/hr over 30 Minutes Intravenous Every 8 hours 05/12/20 1741 05/14/20 1259     Objective: Vitals: Today's Vitals   05/15/20 2014 05/15/20 2249 05/16/20 0347 05/16/20 0539  BP:  (!) 165/80 (!) 150/75   Pulse:  81 79   Resp:  19 (!) 21   Temp:  99 F (37.2 C) 98.8 F (37.1 C)   TempSrc:  Oral Oral   SpO2: 100% 97%  97%   Weight:    59.2 kg  Height:      PainSc:        Intake/Output Summary (Last 24 hours) at 05/16/2020 0814 Last data filed at 05/16/2020 0600 Gross per 24 hour  Intake 570 ml  Output 1650 ml  Net -1080 ml   Filed Weights   05/14/20 0402 05/15/20 0413 05/16/20 0539  Weight: 60.8 kg 59.1 kg 59.2 kg   Weight change: 0.1 kg  Intake/Output from previous day: 12/13 0701 - 12/14 0700 In: 570 [P.O.:480; IV Piggyback:90] Out: 5374 [Urine:1650] Intake/Output this shift: No intake/output data recorded.  Examination: General exam: AAOx3, NAD, weak appearing. HEENT:Oral mucosa moist, Ear/Nose WNL grossly, dentition normal. Respiratory system: bilaterally CLEAR,no wheezing or crackles,no use of accessory muscle Cardiovascular system: S1 & S2 +,No JVD,. Gastrointestinal system: Abdomen soft, NT,ND, BS+ Nervous System:Alert, awake, moving extremities and grossly nonfocal Extremities: No edema, distal peripheral pulses palpable.  Skin: No rashes,no icterus. MSK: Normal muscle bulk,tone, power Data Reviewed: I have personally reviewed following labs and imaging studies CBC: Recent Labs  Lab 05/10/20 2205 05/11/20 0309 05/11/20 1542 05/12/20 0403  05/13/20 0114 05/15/20 0759 05/16/20 0158  WBC 10.2   < > 18.4* 15.5* 10.4 5.2 6.5  NEUTROABS 8.1*  --  15.6*  --   --   --   --   HGB 9.8*   < > 9.9* 8.3* 7.7* 8.8* 8.5*  HCT 30.4*   < > 30.6* 25.8* 23.7* 27.0* 26.1*  MCV 93.3   < > 93.9 94.2 94.0 90.6 90.0  PLT 337   < > 300 233 221 217 231   < > = values in this interval not displayed.   Basic Metabolic Panel: Recent Labs  Lab 05/11/20 1542 05/12/20 0403 05/13/20 0114 05/15/20 0759 05/16/20 0158  NA 134* 137 135 135 135  K 4.0 3.9 4.1 3.4* 3.9  CL 95* 106 104 98 100  CO2 22 20* 21* 23 25  GLUCOSE 153* 145* 133* 97 112*  BUN _0 CREATININE 1.42* 1.03* 0.82 0.60 0.75  CALCIUM 9.5 7.9* 8.2* 8.6* 8.8*   GFR: Estimated Creatinine Clearance: 71.6 mL/min (by C-G formula based on SCr of 0.75 mg/dL). Liver Function Tests: Recent Labs  Lab 05/12/20 0403 05/13/20 0114 05/15/20 0759 05/16/20 0158  AST 17 36 98* 89*  ALT 19 25 99* 105*  ALKPHOS 76 72 113 124  BILITOT 0.4 0.6 0.4 0.4  PROT 5.6* 5.5* 6.0* 6.3*  ALBUMIN 2.2* 2.1* 2.3* 2.3*   No results for input(s): LIPASE, AMYLASE in the last 168 hours. No results for input(s): AMMONIA in the last 168 hours. Coagulation Profile: No results for input(s): INR, PROTIME in the last 168 hours. Cardiac Enzymes: No results for input(s): CKTOTAL, CKMB, CKMBINDEX, TROPONINI in the last 168 hours. BNP (last 3 results) No results for input(s): PROBNP in the last 8760 hours. HbA1C: No results for input(s): HGBA1C in the last 72 hours. CBG: Recent Labs  Lab 05/11/20 1635 05/11/20 2107 05/12/20 0551 05/12/20 1139 05/12/20 1703  GLUCAP 155* 113* 120* 111* 94   Lipid Profile: No results for input(s): CHOL, HDL, LDLCALC, TRIG, CHOLHDL, LDLDIRECT in the last 72 hours. Thyroid Function Tests: No results for input(s): TSH, T4TOTAL, FREET4, T3FREE, THYROIDAB in the last 72 hours. Anemia Panel: No results for input(s): VITAMINB12, FOLATE, FERRITIN, TIBC, IRON,  RETICCTPCT in the last 72 hours. Sepsis Labs: Recent Labs  Lab 05/11/20 1542 05/11/20 1754 05/11/20 1941 05/12/20 0403  05/12/20 0950 05/12/20 1201  PROCALCITON 0.90  --   --  2.47  --   --   LATICACIDVEN  --    < > 3.2* 2.2* 1.8 1.7   < > = values in this interval not displayed.    Recent Results (from the past 240 hour(s))  Culture, Urine     Status: Abnormal   Collection Time: 05/10/20  5:39 PM   Specimen: Urine, Clean Catch  Result Value Ref Range Status   Specimen Description URINE, CLEAN CATCH  Final   Special Requests   Final    NONE Performed at [facility], [address], [place], [place] [address]    Culture (A)  Final    >=100,000 COLONIES/mL ESCHERICHIA COLI Confirmed Extended Spectrum Beta-Lactamase Producer (ESBL).  In bloodstream infections from ESBL organisms, carbapenems are preferred over piperacillin/tazobactam. They are shown to have a lower risk of mortality.    Report Status 05/12/2020 FINAL  Final   Organism ID, Bacteria ESCHERICHIA COLI (A)  Final      Susceptibility   Escherichia coli - MIC*    AMPICILLIN >=32 RESISTANT Resistant     CEFAZOLIN >=64 RESISTANT Resistant     CEFEPIME 16 RESISTANT Resistant     CEFTRIAXONE >=64 RESISTANT Resistant     CIPROFLOXACIN >=4 RESISTANT Resistant     GENTAMICIN >=16 RESISTANT Resistant     IMIPENEM <=0.25 SENSITIVE Sensitive     NITROFURANTOIN 128 RESISTANT Resistant     TRIMETH/SULFA >=320 RESISTANT Resistant     AMPICILLIN/SULBACTAM >=32 RESISTANT Resistant     PIP/TAZO 16 SENSITIVE Sensitive     * >=100,000 COLONIES/mL ESCHERICHIA COLI  Culture, blood (routine x 2)     Status: Abnormal   Collection Time: 05/11/20  5:37 PM   Specimen: BLOOD RIGHT HAND  Result Value Ref Range Status   Specimen Description BLOOD RIGHT HAND  Final   Special Requests   Final    BOTTLES DRAWN AEROBIC AND ANAEROBIC Blood Culture adequate volume   Culture  Setup Time   Final    GRAM NEGATIVE RODS IN BOTH AEROBIC  AND ANAEROBIC BOTTLES CRITICAL RESULT CALLED TO, READ BACK BY AND VERIFIED WITH: W[place] F. [id number] FCP Performed at [facility], [address], [place], [place] [address]    Culture (A)  Final    ESCHERICHIA COLI Confirmed Extended Spectrum Beta-Lactamase Producer (ESBL).  In bloodstream infections from ESBL organisms, carbapenems are preferred over piperacillin/tazobactam. They are shown to have a lower risk of mortality.    Report Status 05/14/2020 FINAL  Final   Organism ID, Bacteria ESCHERICHIA COLI  Final      Susceptibility   Escherichia coli - MIC*    AMPICILLIN >=32 RESISTANT Resistant     CEFAZOLIN >=64 RESISTANT Resistant     CEFEPIME >=32 RESISTANT Resistant     CEFTAZIDIME >=64 RESISTANT Resistant     CEFTRIAXONE >=64 RESISTANT Resistant     CIPROFLOXACIN >=4 RESISTANT Resistant     GENTAMICIN >=16 RESISTANT Resistant     IMIPENEM <=0.25 SENSITIVE Sensitive     TRIMETH/SULFA >=320 RESISTANT Resistant     AMPICILLIN/SULBACTAM >=32 RESISTANT Resistant     PIP/TAZO 16 SENSITIVE Sensitive     * ESCHERICHIA COLI  Blood Culture ID Panel (Reflexed)     Status: Abnormal   Collection Time: 05/11/20  5:37 PM  Result Value Ref Range Status   Enterococcus faecalis NOT DETECTED NOT DETECTED Final   Enterococcus Faecium NOT  DETECTED NOT DETECTED Final   Listeria monocytogenes NOT DETECTED NOT DETECTED Final   Staphylococcus species NOT DETECTED NOT DETECTED Final   Staphylococcus aureus (BCID) NOT DETECTED NOT DETECTED Final   Staphylococcus epidermidis NOT DETECTED NOT DETECTED Final   Staphylococcus lugdunensis NOT DETECTED NOT DETECTED Final   Streptococcus species NOT DETECTED NOT DETECTED Final   Streptococcus agalactiae NOT DETECTED NOT DETECTED Final   Streptococcus pneumoniae NOT DETECTED NOT DETECTED Final   Streptococcus pyogenes NOT DETECTED NOT DETECTED Final   A.calcoaceticus-baumannii NOT DETECTED NOT DETECTED Final   Bacteroides fragilis NOT  DETECTED NOT DETECTED Final   Enterobacterales DETECTED (A) NOT DETECTED Final    Comment: Enterobacterales represent a large order of gram negative bacteria, not a single organism. CRITICAL RESULT CALLED TO, READ BACK BY AND VERIFIED WITH: PHARMD JEREMY F. [id number] FCP    Enterobacter cloacae complex NOT DETECTED NOT DETECTED Final   Escherichia coli DETECTED (A) NOT DETECTED Final    Comment: CRITICAL RESULT CALLED TO, READ BACK BY AND VERIFIED WITH: PHARMD JEREMY F. [phone] FCP    Klebsiella aerogenes NOT DETECTED NOT DETECTED Final   Klebsiella oxytoca NOT DETECTED NOT DETECTED Final   Klebsiella pneumoniae NOT DETECTED NOT DETECTED Final   Proteus species NOT DETECTED NOT DETECTED Final   Salmonella species NOT DETECTED NOT DETECTED Final   Serratia marcescens NOT DETECTED NOT DETECTED Final   Haemophilus influenzae NOT DETECTED NOT DETECTED Final   Neisseria meningitidis NOT DETECTED NOT DETECTED Final   Pseudomonas aeruginosa NOT DETECTED NOT DETECTED Final   Stenotrophomonas maltophilia NOT DETECTED NOT DETECTED Final   Candida albicans NOT DETECTED NOT DETECTED Final   Candida auris NOT DETECTED NOT DETECTED Final   Candida glabrata NOT DETECTED NOT DETECTED Final   Candida krusei NOT DETECTED NOT DETECTED Final   Candida parapsilosis NOT DETECTED NOT DETECTED Final   Candida tropicalis NOT DETECTED NOT DETECTED Final   Cryptococcus neoformans/gattii NOT DETECTED NOT DETECTED Final   CTX-M ESBL DETECTED (A) NOT DETECTED Final    Comment: CRITICAL RESULT CALLED TO, READ BACK BY AND VERIFIED WITH: PHARMD JEREMY F. [id number] FCP (NOTE) Extended spectrum beta-lactamase detected. Recommend a carbapenem as initial therapy.      Carbapenem resistance IMP NOT DETECTED NOT DETECTED Final   Carbapenem resistance KPC NOT DETECTED NOT DETECTED Final   Carbapenem resistance NDM NOT DETECTED NOT DETECTED Final   Carbapenem resist OXA 48 LIKE NOT DETECTED NOT DETECTED Final    Carbapenem resistance VIM NOT DETECTED NOT DETECTED Final    Comment: Performed at [facility], [place] [address] [place], [place] [address]  Culture, blood (routine x 2)     Status: Abnormal   Collection Time: 05/11/20  5:51 PM   Specimen: BLOOD  Result Value Ref Range Status   Specimen Description BLOOD RIGHT ANTECUBITAL  Final   Special Requests   Final    BOTTLES DRAWN AEROBIC AND ANAEROBIC Blood Culture adequate volume   Culture  Setup Time   Final    GRAM NEGATIVE RODS IN BOTH AEROBIC AND ANAEROBIC BOTTLES CRITICAL VALUE NOTED.  VALUE IS CONSISTENT WITH PREVIOUSLY REPORTED AND CALLED VALUE.    Culture (A)  Final    ESCHERICHIA COLI SUSCEPTIBILITIES PERFORMED ON PREVIOUS CULTURE WITHIN THE LAST 5 DAYS. Performed at [facility], [place] [address], [place], [place] [address]    Report Status 05/14/2020 FINAL  Final  Resp panel by RT-PCR (RSV, Flu A&B, Covid) Nasopharyngeal Swab  Status: None   Collection Time: 05/12/20  6:06 PM   Specimen: Nasopharyngeal Swab; Nasopharyngeal(NP) swabs in vial transport medium  Result Value Ref Range Status   SARS Coronavirus 2 by RT PCR NEGATIVE NEGATIVE Final    Comment: (NOTE) SARS-CoV-2 target nucleic acids are NOT DETECTED.  The SARS-CoV-2 RNA is generally detectable in upper respiratory specimens during the acute phase of infection. The lowest concentration of SARS-CoV-2 viral copies this assay can detect is 138 copies/mL. A negative result does not preclude SARS-Cov-2 infection and should not be used as the sole basis for treatment or other patient management decisions. A negative result may occur with  improper specimen collection/handling, submission of specimen other than nasopharyngeal swab, presence of viral mutation(s) within the areas targeted by this assay, and inadequate number of viral copies(<138 copies/mL). A negative result must be combined with clinical observations, patient history, and  epidemiological information. The expected result is Negative.  Fact Sheet for Patients:  EntrepreneurPulse.com.au  Fact Sheet for Healthcare Providers:  IncredibleEmployment.be  This test is no t yet approved or cleared by the [place] FDA and  has been authorized for detection and/or diagnosis of SARS-CoV-2 by FDA under an Emergency Use Authorization (EUA). This EUA will remain  in effect (meaning this test can be used) for the duration of the COVID-19 declaration under Section 564(b)(1) of the Act, 21 U.S.C.section 360bbb-3(b)(1), unless the authorization is terminated  or revoked sooner.       Influenza A by PCR NEGATIVE NEGATIVE Final   Influenza B by PCR NEGATIVE NEGATIVE Final    Comment: (NOTE) The Xpert Xpress SARS-CoV-2/FLU/RSV plus assay is intended as an aid in the diagnosis of influenza from Nasopharyngeal swab specimens and should not be used as a sole basis for treatment. Nasal washings and aspirates are unacceptable for Xpert Xpress SARS-CoV-2/FLU/RSV testing.  Fact Sheet for Patients: EntrepreneurPulse.com.au  Fact Sheet for Healthcare Providers: IncredibleEmployment.be  This test is not yet approved or cleared by the [place] FDA and has been authorized for detection and/or diagnosis of SARS-CoV-2 by FDA under an Emergency Use Authorization (EUA). This EUA will remain in effect (meaning this test can be used) for the duration of the COVID-19 declaration under Section 564(b)(1) of the Act, 21 U.S.C. section 360bbb-3(b)(1), unless the authorization is terminated or revoked.     Resp Syncytial Virus by PCR NEGATIVE NEGATIVE Final    Comment: (NOTE) Fact Sheet for Patients: EntrepreneurPulse.com.au  Fact Sheet for Healthcare Providers: IncredibleEmployment.be  This test is not yet approved or cleared by the [place] FDA and has been  authorized for detection and/or diagnosis of SARS-CoV-2 by FDA under an Emergency Use Authorization (EUA). This EUA will remain in effect (meaning this test can be used) for the duration of the COVID-19 declaration under Section 564(b)(1) of the Act, 21 U.S.C. section 360bbb-3(b)(1), unless the authorization is terminated or revoked.  Performed at [facility], [place] [address] [place], [place] [address]      Radiology Studies: No results found.   LOS: 4 days   Antonieta Pert, MD [facility]  05/16/2020, 8:14 AM

## 2020-05-17 DIAGNOSIS — R569 Unspecified convulsions: Secondary | ICD-10-CM

## 2020-05-17 LAB — CBC
HCT: 28 % — ABNORMAL LOW (ref 36.0–46.0)
Hemoglobin: 9.2 g/dL — ABNORMAL LOW (ref 12.0–15.0)
MCH: 29.8 pg (ref 26.0–34.0)
MCHC: 32.9 g/dL (ref 30.0–36.0)
MCV: 90.6 fL (ref 80.0–100.0)
Platelets: 258 10*3/uL (ref 150–400)
RBC: 3.09 MIL/uL — ABNORMAL LOW (ref 3.87–5.11)
RDW: 15.9 % — ABNORMAL HIGH (ref 11.5–15.5)
WBC: 7.4 10*3/uL (ref 4.0–10.5)
nRBC: 0 % (ref 0.0–0.2)

## 2020-05-17 LAB — COMPREHENSIVE METABOLIC PANEL
ALT: 113 U/L — ABNORMAL HIGH (ref 0–44)
AST: 92 U/L — ABNORMAL HIGH (ref 15–41)
Albumin: 2.5 g/dL — ABNORMAL LOW (ref 3.5–5.0)
Alkaline Phosphatase: 133 U/L — ABNORMAL HIGH (ref 38–126)
Anion gap: 11 (ref 5–15)
BUN: 11 mg/dL (ref 6–20)
CO2: 23 mmol/L (ref 22–32)
Calcium: 9.1 mg/dL (ref 8.9–10.3)
Chloride: 102 mmol/L (ref 98–111)
Creatinine, Ser: 0.76 mg/dL (ref 0.44–1.00)
GFR, Estimated: >60 mL/min (ref >60)
Glucose, Bld: 103 mg/dL — ABNORMAL HIGH (ref 70–99)
Potassium: 3.9 mmol/L (ref 3.5–5.1)
Sodium: 136 mmol/L (ref 135–145)
Total Bilirubin: 0.5 mg/dL (ref 0.3–1.2)
Total Protein: 6.2 g/dL — ABNORMAL LOW (ref 6.5–8.1)

## 2020-05-17 MED ORDER — VALPROATE SODIUM 500 MG/5ML IV SOLN
1000.0000 mg | Freq: Once | INTRAVENOUS | Status: AC
  Administered 2020-05-17: 11:00:00 1000 mg via INTRAVENOUS
  Filled 2020-05-17: qty 10

## 2020-05-17 MED ORDER — LORAZEPAM 1 MG PO TABS
1.0000 mg | ORAL_TABLET | Freq: Four times a day (QID) | ORAL | Status: AC
  Administered 2020-05-17 (×2): 1 mg via ORAL
  Filled 2020-05-17 (×3): qty 1

## 2020-05-17 MED ORDER — DIVALPROEX SODIUM 500 MG PO DR TAB: 500.0000 mg | DELAYED_RELEASE_TABLET | Freq: Two times a day (BID) | ORAL | Status: DC

## 2020-05-17 MED ORDER — DIVALPROEX SODIUM 500 MG PO DR TAB
1000.0000 mg | DELAYED_RELEASE_TABLET | Freq: Every day | ORAL | Status: DC
  Filled 2020-05-17 (×2): qty 2

## 2020-05-17 MED ORDER — DIVALPROEX SODIUM 500 MG PO DR TAB
500.0000 mg | DELAYED_RELEASE_TABLET | Freq: Every day | ORAL | Status: DC
  Administered 2020-05-18: 08:00:00 500 mg via ORAL
  Filled 2020-05-17: qty 1

## 2020-05-17 MED ORDER — LORAZEPAM 0.5 MG PO TABS
0.5000 mg | ORAL_TABLET | Freq: Four times a day (QID) | ORAL | Status: DC
  Administered 2020-05-18: 09:00:00 0.5 mg via ORAL
  Filled 2020-05-17: qty 1

## 2020-05-17 NOTE — Plan of Care (Signed)
  Problem: Education: Goal: Knowledge of General Education information will improve Description Including pain rating scale, medication(s)/side effects and non-pharmacologic comfort measures Outcome: Progressing   

## 2020-05-17 NOTE — Progress Notes (Signed)
Occupational Therapy Treatment Patient Details Name: Maria [name] MRN: [id number] DOB: 03-16-1961 Today's Date: 05/17/2020    History of present illness 59 yo admitted with sepsis, pyelonephritis and encephalopathy. 11/18 Sz with continuous EEG. PMhx: epilepsy, intellectual disability, GERD.  Readmitted to [facility] from CIR.   OT comments  Patient yelling out for assist.  Mother in the hall requesting someone assist her daughter to the bathroom.  Nursing Tech unavailable, OT assisted with transfers to [facility] and then back to the bed from the recliner.  Patient needing up to Mod A for transfers times three, and setup for hygiene post urination.  Patient then sat edge of bed and was setup and supervision for washing her hands.  Min Guard for positioning in bed with verbal cues.  OT is following her in the acute setting, CIR has been recommended.    Follow Up Recommendations  Supervision/Assistance - 24 hour;CIR    Equipment Recommendations       Recommendations for Other Services      Precautions / Restrictions Precautions Precautions: Fall Precaution Comments: Contact Precautions. Restrictions Weight Bearing Restrictions: No       Mobility Bed Mobility Overal bed mobility: Needs Assistance Bed Mobility: Sit to Supine Rolling: Min assist Sidelying to sit: Min assist Supine to sit: HOB elevated;Min assist Sit to supine: Min guard   General bed mobility comments: Pt required assistance to move to edge of bed.  Transfers Overall transfer level: Needs assistance Equipment used: Rolling walker (2 wheeled) Transfers: Squat Pivot Transfers Sit to Stand: Min assist Stand pivot transfers: Min assist Squat pivot transfers: Mod assist     General transfer comment: Cues for hand placement to push from seated surface to rise into standing. alittle assist to pivot to 3N1.  Went to bathroom and then needed cues to stand to RW for hand placement and needed steadying once up.    Balance  Overall balance assessment: Needs assistance Sitting-balance support: Feet supported;No upper extremity supported Sitting balance-Leahy Scale: Fair Sitting balance - Comments: posterior lean Postural control: Posterior lean Standing balance support: Bilateral upper extremity supported Standing balance-Leahy Scale: Poor Standing balance comment: Assist of 1 for balance with RW                           ADL either performed or assessed with clinical judgement   ADL       Grooming: Wash/dry face;Min guard;Wash/dry hands;Sitting                               Functional mobility during ADLs: Moderate assistance                         Cognition Arousal/Alertness: Awake/alert Behavior During Therapy: Flat affect;Anxious;Impulsive Overall Cognitive Status: History of cognitive impairments - at baseline Area of Impairment: Problem solving;Awareness;Safety/judgement;Following commands;Memory;Attention                 Orientation Level: Situation Current Attention Level: Selective Memory: Decreased short-term memory;Decreased recall of precautions Following Commands: Follows one step commands inconsistently;Follows one step commands with increased time Safety/Judgement: Decreased awareness of deficits;Decreased awareness of safety Awareness: Intellectual Problem Solving: Slow processing;Requires verbal cues;Requires tactile cues;Decreased initiation;Difficulty sequencing General Comments: reported need to go to bathroom, but required step by step commands with minimal follow through noted, significant deficits in safety noted  Exercises General Exercises - Lower Extremity Long Arc Quad: AROM;Both;10 reps;Seated Hip Flexion/Marching: AROM;Both;10 reps;Seated   Shoulder Instructions       General Comments  VSS    Pertinent Vitals/ Pain       Pain Assessment: Faces Faces Pain Scale: Hurts a little bit Pain Location: generalized with  mobility, unable to localize Pain Descriptors / Indicators: Grimacing;Guarding Pain Intervention(s): Limited activity within patient's tolerance;Monitored during session;Repositioned                                                          Frequency  Min 2X/week        Progress Toward Goals  OT Goals(current goals can now be found in the care plan section)  Progress towards OT goals: Progressing toward goals  Acute Rehab OT Goals Patient Stated Goal: Mother hoping for her to return to rehab OT Goal Formulation: With family Potential to Achieve Goals: [place] Discharge plan remains appropriate    Co-evaluation                 AM-PAC OT "6 Clicks" Daily Activity     Outcome Measure   Help from another person eating meals?: A Little Help from another person taking care of personal grooming?: A Little Help from another person toileting, which includes using toliet, bedpan, or urinal?: Total Help from another person bathing (including washing, rinsing, drying)?: A Lot Help from another person to put on and taking off regular upper body clothing?: A Little Help from another person to put on and taking off regular lower body clothing?: A Lot 6 Click Score: 14    End of Session    OT Visit Diagnosis: Unsteadiness on feet (R26.81);Other abnormalities of gait and mobility (R26.89);Muscle weakness (generalized) (M62.81);Other symptoms and signs involving cognitive function   Activity Tolerance Patient tolerated treatment well   Patient Left in bed;with call bell/phone within reach;with bed alarm set;with family/visitor present   Nurse Communication Mobility status        Time: 7915-0569 OT Time Calculation (min): 12 min  Charges: OT General Charges $OT Visit: 1 Visit OT Treatments $Self Care/Home Management : 8-22 mins  05/17/2020  Rich, OTR/L  Acute Rehabilitation Services  Office:  [phone]    Metta Clines 05/17/2020, 4:14 PM

## 2020-05-17 NOTE — Progress Notes (Signed)
Physical Therapy Treatment Patient Details Name: Maria [name] MRN: [id number] DOB: August 08, 1960 Today's Date: 05/17/2020    History of Present Illness 59 yo admitted with sepsis, pyelonephritis and encephalopathy. 11/18 Sz with continuous EEG. PMhx: epilepsy, intellectual disability, GERD.  Readmitted to [facility] from CIR.    PT Comments    Pt admitted with above diagnosis. Pt was able to ambulate with min to mod assist around bed to recliner. Pt generally unsteady in her feet with little endurance for activity.  Pt did some exercises as well and states "I am exhausted."  Will continue to progress as able.  Pt currently with functional limitations due to balance and endurance deficits. Pt will benefit from skilled PT to increase their independence and safety with mobility to allow discharge to the venue listed below.     Follow Up Recommendations  CIR     Equipment Recommendations  None recommended by PT    Recommendations for Other Services Rehab consult     Precautions / Restrictions Precautions Precautions: Fall Precaution Comments: Contact Precautions. Restrictions Weight Bearing Restrictions: No    Mobility  Bed Mobility Overal bed mobility: Needs Assistance Bed Mobility: Supine to Sit;Sit to Supine Rolling: Min assist Sidelying to sit: Min assist Supine to sit: [facility] elevated;Min assist     General bed mobility comments: Pt required assistance to move to edge of bed.  Transfers Overall transfer level: Needs assistance Equipment used: Rolling walker (2 wheeled) Transfers: Sit to/from Stand Sit to Stand: Min assist Stand pivot transfers: Min assist       General transfer comment: Cues for hand placement to push from seated surface to rise into standing. alittle assist to pivot to 3N1.  Went to bathroom and then needed cues to stand to RW for hand placement and needed steadying once up.Neeced total assist to be cleaned as pt had BM.   Ambulation/Gait Ambulation/Gait  assistance: Mod assist;Min assist Gait Distance (Feet): 20 Feet Assistive device: Rolling walker (2 wheeled) Gait Pattern/deviations: Trunk flexed;Step-to pattern;Decreased step length - right;Decreased step length - left;Drifts right/left Gait velocity: decreased Gait velocity interpretation: <1.31 ft/sec, indicative of household ambulator General Gait Details: Pt required cues for safety and assistance to manage RW position.  Pt with noted weakness but no overt LOB. Fatigues quickly.   Stairs             Wheelchair Mobility    Modified Rankin (Stroke Patients Only)       Balance Overall balance assessment: Needs assistance Sitting-balance support: Feet supported;No upper extremity supported Sitting balance-Leahy Scale: Fair Sitting balance - Comments: posterior lean Postural control: Posterior lean Standing balance support: Bilateral upper extremity supported Standing balance-Leahy Scale: Poor Standing balance comment: Assist of 1 for balance with RW                            Cognition Arousal/Alertness: Awake/alert Behavior During Therapy: Flat affect;Anxious;Impulsive Overall Cognitive Status: History of cognitive impairments - at baseline Area of Impairment: Problem solving;Awareness;Safety/judgement;Following commands;Memory;Attention                 Orientation Level: Situation Current Attention Level: Selective Memory: Decreased short-term memory;Decreased recall of precautions Following Commands: Follows one step commands inconsistently;Follows one step commands with increased time Safety/Judgement: Decreased awareness of deficits;Decreased awareness of safety Awareness: Intellectual Problem Solving: Slow processing;Requires verbal cues;Requires tactile cues;Decreased initiation;Difficulty sequencing General Comments: reported need to go to bathroom, but required step by step commands with minimal  follow through noted, significant deficits  in safety noted      Exercises General Exercises - Lower Extremity Long Arc Quad: AROM;Both;10 reps;Seated Hip Flexion/Marching: AROM;Both;10 reps;Seated    General Comments        Pertinent Vitals/Pain Pain Assessment: Faces Faces Pain Scale: Hurts a little bit Pain Location: generalized with mobility, unable to localize Pain Descriptors / Indicators: Grimacing;Guarding Pain Intervention(s): Limited activity within patient's tolerance;Monitored during session;Repositioned    Home Living                      Prior Function            PT Goals (current goals can now be found in the care plan section) Acute Rehab PT Goals Patient Stated Goal: None stated Progress towards PT goals: Progressing toward goals    Frequency    Min 3X/week      PT Plan Current plan remains appropriate    Co-evaluation              AM-PAC PT "6 Clicks" Mobility   Outcome Measure  Help needed turning from your back to your side while in a flat bed without using bedrails?: A Little Help needed moving from lying on your back to sitting on the side of a flat bed without using bedrails?: A Little Help needed moving to and from a bed to a chair (including a wheelchair)?: A Little Help needed standing up from a chair using your arms (e.g., wheelchair or bedside chair)?: A Little Help needed to walk in hospital room?: A Lot Help needed climbing 3-5 steps with a railing? : Total 6 Click Score: 15    End of Session Equipment Utilized During Treatment: Gait belt Activity Tolerance: Patient limited by fatigue Patient left: with call bell/phone within reach;in chair;with chair alarm set Nurse Communication: Mobility status PT Visit Diagnosis: Muscle weakness (generalized) (M62.81);Difficulty in walking, not elsewhere classified (R26.2);Adult, failure to thrive (R62.7)     Time: 0569-7948 PT Time Calculation (min) (ACUTE ONLY): 16 min  Charges:  $Gait Training: 8-22 mins                      Dylin Ihnen W,PT [place] Pager:  [phone]  Office:  [place] 05/17/2020, 2:09 PM

## 2020-05-17 NOTE — Progress Notes (Signed)
05/16/20  Informed by RN that pt had focal seizure this evening and is Pt post ictal, vitals stable.Rapid was called. Pt back to baseline, No more seizure recurrence. I called and discussed with Dr Curly Shores from neurology, reviewed her meds and Neurology will consult soon. Advised RN to monitor closely and inform as needed.

## 2020-05-17 NOTE — Progress Notes (Addendum)
Pharmacy Consult  Pharmacy consulted for divalproex (Depakote) management. Patient on PTA at dose of 500 mg BID- which has been continued as inpatient. Has history of seizure disorder - on divalproex, vimpat, and clonazepam/valium PTA. Per outpatient note on 11/25/2019 the last seizure was about 4 years ago and she she has had been on divalproex, vimpat, and clonazepan since at least 11/2015.    She is on ertapenem for ESBL E Coli  Bactteria. Of note, significant interaction between meropenem and divalproex - concurrent use may result in decrease valproic acid concentrations. She was noted with a focal seizure on the evening on 12/14.  Valproic acid levels: 12/6: 51 12/12: < 10    I spoke with Dr. Curly Shores with Neurology and no changes in the AED regimen as she has been managed well on her current regimen.  Plans will be to adjust her valproic acid regimen  Plan -valproic acid 1000mg  IV now -Increase po regimen to 1000mg  po in the evening and 500mg  in the morning  -Check a valproic acid level tomorrow morning to assess the effect of the load  Hildred Laser, PharmD Clinical Pharmacist **Pharmacist phone directory can now be found on [email or web address] ([email or web address]).  Listed under [place].

## 2020-05-17 NOTE — Consult Note (Signed)
NEURO HOSPITALIST CONSULT NOTE   Requestig physician: Dr. Mel Almond  Reason for Consult: Breakthrough seizure in the context of sepsis  History obtained from:  Chart     HPI:                                                                                                                                          Maria [name] is an 59 y.o. female with a history of seizures, mental retardation, CHF, GERD and chronic constipatoin who was admitted on 12/10 for further management of sepsis due to E. Coli bacteremia from UTI. She was initially admitted to [facility] on 11/12 with septic shock requiring vasopressors.   Yesterday evening, she had a breakthrough seizure. Per RN note: "This RN was called to patients room. Patient appeared to be having a focal seizure. Vital signs stable."  Rapid response was called and documented the following: "Upon my arrival patient has returned to her neuro baseline.  RN describes a postictal phase after seizure.  She has been readmitted from Rehab a couple days ago for sepsis and has been on antibiotics. BP 160/79  SR 78  RR 17  O2 sat 98%."  Neurology was consulted for further evaluation.   Current anticonvulsant regimen: Clonazepam 0.25 mg po BID, valproic acid 500 mg po BID and lacosamide 200 mg po BID.   Past Medical History:  Diagnosis Date  . Constipation   . Dysphagia   . Fracture    R foot  . GERD (gastroesophageal reflux disease)   . History of shingles 10/2016  . Mental retardation    lesion in head  . Seizures ([place])    "not fully controlled on max doses of meds" (Neuro ofc note 12/2014)  . Thrombocytopenia ([place])   . Tremor     Past Surgical History:  Procedure Laterality Date  . BIOPSY  09/16/2014   Procedure: BIOPSY;  Surgeon: Rogene Houston, MD;  Location: [facility] ORS;  Service: Endoscopy;;  . CATARACT EXTRACTION     both eyes, May of 2015  . COLONOSCOPY    . COLONOSCOPY WITH PROPOFOL N/A 11/20/2018   Procedure: COLONOSCOPY  WITH PROPOFOL;  Surgeon: Rogene Houston, MD;  Location: [facility] ENDO SUITE;  Service: Endoscopy;  Laterality: N/A;  . ESOPHAGEAL DILATION N/A 09/16/2014   Procedure: ESOPHAGEAL DILATION WITH 54FR MALONEY DILATOR;  Surgeon: Rogene Houston, MD;  Location: [facility] ORS;  Service: Endoscopy;  Laterality: N/A;  . ESOPHAGOGASTRODUODENOSCOPY (EGD) WITH PROPOFOL N/A 09/16/2014   Procedure: ESOPHAGOGASTRODUODENOSCOPY (EGD) WITH PROPOFOL;  Surgeon: Rogene Houston, MD;  Location: [facility] ORS;  Service: Endoscopy;  Laterality: N/A;  . MOUTH SURGERY    . POLYPECTOMY  11/20/2018   Procedure: POLYPECTOMY;  Surgeon: Rogene Houston, MD;  Location: [facility] ENDO SUITE;  Service: Endoscopy;;  colon  . Skin graft to gum Right 08/2013  . TONSILLECTOMY AND ADENOIDECTOMY    . TOTAL ABDOMINAL HYSTERECTOMY      Family History  Problem Relation Age of Onset  . High Cholesterol Mother   . High blood pressure Mother   . Diabetes Father               Social History:  reports that she quit smoking about 13 years ago. Her smoking use included cigarettes. She has a 22.50 pack-year smoking history. She has never used smokeless tobacco. She reports that she does not drink alcohol and does not use drugs.  Allergies  Allergen Reactions  . Codeine Nausea And Vomiting  . Lamictal [Lamotrigine] Rash    MEDICATIONS:                                                                                                                     Scheduled: . atorvastatin  20 mg Oral QPM  . benzonatate  200 mg Oral TID  . chlorhexidine  15 mL Mouth Rinse BID  . clonazepam  0.25 mg Oral BID  . divalproex  500 mg Oral Q12H  . docusate sodium  100 mg Oral BID  . enoxaparin (LOVENOX) injection  40 mg Subcutaneous Q24H  . feeding supplement  237 mL Oral TID BM  . lacosamide  200 mg Oral BID  . mouth rinse  15 mL Mouth Rinse q12n4p  . mouth rinse  15 mL Mouth Rinse q12n4p  . metoprolol tartrate  50 mg Oral BID  . mometasone-formoterol  2 puff Inhalation  BID  . multivitamin with minerals  1 tablet Oral Daily  . pantoprazole  40 mg Oral QHS  . QUEtiapine  25 mg Oral BID  . sodium chloride flush  3 mL Intravenous Q12H   Continuous: . ertapenem 1,000 mg (05/16/20 1650)      ROS:                                                                                                                                       The patient is a poor historian due to cognitive impairment and poor cooperation.    Blood pressure (!) 156/74, pulse 70, temperature 98.8 F (37.1 C), temperature source Oral, resp. rate 18, height 5\' 6"  (1.676 m), weight 59.3 kg, SpO2 96 %.   General Examination:  Physical Exam  HEENT-  [place]/AT  Lungs-Respirations unlabored.  Extremities- No edema  Neurological Examination Mental Status: Awake and alert. Poorly oriented. Speech is fluent with intact comprehension for basic commands. Mildly agitated. Poorly cooperative. Dysthymic affect.  Cranial Nerves: II: Briefly fixates on visual stimuli when asked. Poor eye contect.  III,IV, VI: EOMI. No ptosis.  VII: Face symmetric.  VIII: hearing intact to voice IX,X: No hypophonia XI: Head is midline Motor: 4+/5 x 4 in the context of poor cooperation with exam.  Sensory: Grossly intact to FT x 4. Poorly cooperative.  Deep Tendon Reflexes: Deferred due to mild agitation and being non-cooperative.  Cerebellar: No ataxia with FNF bilaterally  Gait: Deferred   Lab Results: Basic Metabolic Panel: Recent Labs  Lab 05/12/20 0403 05/13/20 0114 05/15/20 0759 05/16/20 0158 05/17/20 0128  NA 137 135 135 135 136  K 3.9 4.1 3.4* 3.9 3.9  CL 106 104 98 100 102  CO2 20* 21* 23 25 23   GLUCOSE 145* 133* 97 112* 103*  BUN 12 10 12 10 11   CREATININE 1.03* 0.82 0.60 0.75 0.76  CALCIUM 7.9* 8.2* 8.6* 8.8* 9.1    CBC: Recent Labs  Lab 05/10/20 2205 05/11/20 0309 05/11/20 1542  05/12/20 0403 05/13/20 0114 05/15/20 0759 05/16/20 0158 05/17/20 0128  WBC 10.2   < > 18.4* 15.5* 10.4 5.2 6.5 7.4  NEUTROABS 8.1*  --  15.6*  --   --   --   --   --   HGB 9.8*   < > 9.9* 8.3* 7.7* 8.8* 8.5* 9.2*  HCT 30.4*   < > 30.6* 25.8* 23.7* 27.0* 26.1* 28.0*  MCV 93.3   < > 93.9 94.2 94.0 90.6 90.0 90.6  PLT 337   < > 300 233 221 217 231 258   < > = values in this interval not displayed.    Cardiac Enzymes: No results for input(s): CKTOTAL, CKMB, CKMBINDEX, TROPONINI in the last 168 hours.  Lipid Panel: No results for input(s): CHOL, TRIG, HDL, CHOLHDL, VLDL, LDLCALC in the last 168 hours.  Imaging: No results found.  Assessment: 59 year old female with history of seizures who experienced a breakthrough seizure yesterday. Most likely a provoked breakthrough seizure in the context of her sepsis.  1. Exam reveals no lateralizing findings. Cognitive impairment is noted. No seizure activty appreciated.  2. Current anticonvulsant regimen: Clonazepam 0.25 mg po BID, valproic acid 500 mg po BID and lacosamide 200 mg po BID.   Recommendations: 1. Continue current anticonvulsant regimen at the current doses 2. Ativan oral taper to augment her standard anticonvulsant regimen for the next 3 days has been ordered: Ativan 1 mg po q6h x 4 doses, then 0.5 mg po q6h x 8 doses.  3. Inpatient seizure precautions 4. Treatment of sepsis per primary team.   Electronically signed: Dr. Kerney Elbe 05/17/2020, 7:19 AM

## 2020-05-17 NOTE — Progress Notes (Signed)
[facility] PROGRESS NOTE    Maria [name]  RDE:[id number] DOB: 10-Nov-1960 DOA: 05/12/2020 PCP: Celene Squibb, MD      Brief Narrative:  59 year old female with significant history of intellectual disability/seizure disorder, chronic diastolic CHF, GERD, chronic constipation was seen by [facility] in consultation on 05/11/20 for new onset of fever and abnormal UA, further work-up revealed severe sepsis due to ESBL E. coli bacteremia and UTI, patient was transferred from [facility] progressive care return 12/10 for further management, was seen by PCCM, ID. Originally admitted at [facility] 04/14/2020 with septic shock requiring vasopressors presumed to be urinary source and transferred to [facility] ICU, CIN suspicious for pyelonephritis and small abscess in the left lower pole kidney, ultrasound 11 days later showed stable lesion felt to be representing a small perinephric abscess versus renal cyst, blood cultures drawn and negative but urine culture grew coli, not ESBL.  She completed a 14 days course of ceftriaxone.  Hospital course complicated by breakthrough seizures delirium agitation requiring Precedex, urology did not feel the abscess required any further intervention.  She was transferred subsequently to CIR on 05/05/2020 and was doing well until 05/11/2019 when she developed fever and abnormal UA suggestive of UTI and urine culture grew E. coli, and TR[facility] was consulted 12/9. Patient slowly improved with IV meropenem, being followed by ID and IR and urology.    Past Medical History:  Diagnosis Date  . Constipation   . Dysphagia   . Fracture    R foot  . GERD (gastroesophageal reflux disease)   . History of shingles 10/2016  . Mental retardation    lesion in head  . Seizures ([place])    "not fully controlled on max doses of meds" (Neuro ofc note 12/2014)  . Thrombocytopenia ([place])   . Tremor       Assessment & Plan:  Severe sepsis 2/2 ESBL E Coli bacteremia w/ pyelonephritis and left  renal abscess:  Sepsis patient has resolved.Responed well to meropenem and switched to invanz 12/21 as per ID-Recent imaging shows evolving 12 mm likely abscess.  No distinct fluid collection that looked amenable to drainage on CT. ID  Has discussed with IR on 12/13 and it is not amenable for drainage, plan is to continue on IV Invanz.  She needs CT scan in about 2 weeks around 05/26/2020 to reassess the renal abscess that was not drained to guide duration of therapy.  If patient gets discharged home prior to this please notify ID.  She can continue Invanz in  [place]. to check ESR/CRP next week. Monitor for further seizure activity while on Invanz.    Covering hospitalist discussed with ID okay to discharge to CIR and ID will need to follow-up in CIR  Lethargy/Acute metabolic encephalopathy likely in the setting of sepsis: Improving. Klonopin dose was decreased over the weekend.  On Klonopin 0.25 mg BID and Seroquel 25 twice daily.  Minimize sedation continue PT OT and CIR placement once available.  Transaminitis AST ALT elevated   likely sepsis induced continue to monitor.  GERD w/ Nausea-multiple episodes of nausea 12/13 needing Zofran/Reglan.  On dysphagia 3 diet continue as tolerated.  Nausea improving.  Hypokalemia resolved  Intellectual disability: Chronic.  Continue supportive care  Seizure disorder: History of breakthrough seizures during prior hospitalization, continue current AED with Vimpat, valproate Klonopin.  Had another episode of seizure activity last night.  Neurology follow-up is appreciated and is currently on tapering dose of Ativan.  AKI -resolved Lactic acidosis  resolved  Chronic diastolic CHF with EF 60 to 65% on echo 04/17/2020. Chest x-ray 12/10 stable.  Monitor for any signs of fluid overload.    GERD continue PPI  Anemia likely from chronic disease.    Pressure Injury 04/22/20 Back Medial;Right Stage 2 -  Partial thickness loss of dermis presenting as a  shallow open injury with a red, pink wound bed without slough. Medical device pressure injury-shape of lopez valve (Active)  04/22/20 0800  Location: Back  Location Orientation: Medial;Right  Staging: Stage 2 -  Partial thickness loss of dermis presenting as a shallow open injury with a red, pink wound bed without slough.  Wound Description (Comments): Medical device pressure injury-shape of lopez valve  Present on Admission: No        . atorvastatin  20 mg Oral QPM  . benzonatate  200 mg Oral TID  . chlorhexidine  15 mL Mouth Rinse BID  . clonazepam  0.25 mg Oral BID  . divalproex  1,000 mg Oral QHS  . [START ON 05/18/2020] divalproex  500 mg Oral Daily  . docusate sodium  100 mg Oral BID  . enoxaparin (LOVENOX) injection  40 mg Subcutaneous Q24H  . feeding supplement  237 mL Oral TID BM  . lacosamide  200 mg Oral BID  . [START ON 05/18/2020] LORazepam  0.5 mg Oral Q6H  . LORazepam  1 mg Oral Q6H  . mouth rinse  15 mL Mouth Rinse q12n4p  . mouth rinse  15 mL Mouth Rinse q12n4p  . metoprolol tartrate  50 mg Oral BID  . mometasone-formoterol  2 puff Inhalation BID  . multivitamin with minerals  1 tablet Oral Daily  . pantoprazole  40 mg Oral QHS  . QUEtiapine  25 mg Oral BID  . sodium chloride flush  3 mL Intravenous Q12H     Current Meds  Medication Sig  . acetaminophen (TYLENOL) 500 MG tablet Take 1 tablet (500 mg total) by mouth every 6 (six) hours as needed for moderate pain.  Marland Kitchen atorvastatin (LIPITOR) 20 MG tablet Take 20 mg by mouth daily.  . Calcium 600-400 MG-UNIT CHEW Chew 1 tablet by mouth in the morning and at bedtime.  . clonazePAM (KLONOPIN) 1 MG tablet Take 1 tablet (1 mg total) by mouth 3 (three) times daily. (Patient taking differently: Take 0.5 mg by mouth 2 (two) times daily.)  . diazepam (VALIUM) 2 MG tablet Take 1 tablet (2 mg total) by mouth at bedtime.  . divalproex (DEPAKOTE ER) 500 MG 24 hr tablet Take 1 tablet (500 mg total) by mouth 2 (two) times  daily.  Marland Kitchen docusate sodium (COLACE) 100 MG capsule Take 1 capsule (100 mg total) by mouth 2 (two) times daily.  . feeding supplement (ENSURE ENLIVE / ENSURE PLUS) LIQD Take 237 mLs by mouth 3 (three) times daily between meals.  Marland Kitchen lacosamide (VIMPAT) 200 MG TABS tablet Take 1 tablet (200 mg total) by mouth 2 (two) times daily.  Marland Kitchen levocetirizine (XYZAL) 5 MG tablet Take 5 mg by mouth every evening.  . metoprolol tartrate (LOPRESSOR) 25 MG tablet Take 1 tablet (25 mg total) by mouth 2 (two) times daily.  Marland Kitchen omeprazole (PRILOSEC) 40 MG capsule Take 1 capsule (40 mg total) by mouth daily.  Marland Kitchen oxybutynin (DITROPAN-XL) 5 MG 24 hr tablet Take 5 mg by mouth at bedtime.   . Pediatric Multiple Vit-C-FA (PEDIATRIC MULTIVITAMIN) chewable tablet Chew 1 tablet by mouth daily.  . QUEtiapine (SEROQUEL) 25 MG tablet Take  3 tablets (75 mg total) by mouth daily.  . QUEtiapine (SEROQUEL) 50 MG tablet Take 3 tablets (150 mg total) by mouth at bedtime.        Disposition: Status is: Inpatient  Remains inpatient appropriate because:Altered mental status   Dispo: The patient is from: Acute rehab              Anticipated d/c is to: CIR              Anticipated d/c date is: 1 day              Patient currently is not medically stable to d/c.              MDM: The below labs and imaging reports were reviewed and summarized above.  Medication management as above.     DVT prophylaxis: enoxaparin (LOVENOX) injection 40 mg Start: 05/13/20 1000  Code Status: Full code Family Communication: Discussed case with her mother at the bedside.   Consultants:   ID, neurology.           Subjective: Patient reports that she feels okay.  Has an episode of seizure overnight.  Objective: Vitals:   05/17/20 0414 05/17/20 0439 05/17/20 0802 05/17/20 0805  BP: (!) 156/74   (!) 153/75  Pulse: 70   75  Resp: 18   14  Temp: 98.8 F (37.1 C)   98 F (36.7 C)  TempSrc: Oral   Oral  SpO2: 96%  98%  96%  Weight:  59.3 kg    Height:        Intake/Output Summary (Last 24 hours) at 05/17/2020 1809 Last data filed at 05/17/2020 1300 Gross per 24 hour  Intake 240 ml  Output 900 ml  Net -660 ml   Filed Weights   05/15/20 0413 05/16/20 0539 05/17/20 0439  Weight: 59.1 kg 59.2 kg 59.3 kg    Examination: General: Not in obvious distress, alert awake . HENT:   No scleral pallor or icterus noted. Oral mucosa is moist.  Chest:   Clear to auscultation bilaterally. No crackles or wheezes.  CVS: S1 &S2 heard. No murmur.  Regular rate and rhythm. Abdomen: Soft, nontender, nondistended.  Bowel sounds are normoactive. Extremities: No cyanosis, or edema.   Psych: Alert, awake.  Normal mood and affect CNS:  No cranial nerve deficits.   Skin: Warm and dry.  No rashes noted.  Data Reviewed: I have personally reviewed following labs and imaging studies:  CBC: Recent Labs  Lab 05/10/20 2205 05/11/20 0309 05/11/20 1542 05/12/20 0403 05/13/20 0114 05/15/20 0759 05/16/20 0158 05/17/20 0128  WBC 10.2   < > 18.4* 15.5* 10.4 5.2 6.5 7.4  NEUTROABS 8.1*  --  15.6*  --   --   --   --   --   HGB 9.8*   < > 9.9* 8.3* 7.7* 8.8* 8.5* 9.2*  HCT 30.4*   < > 30.6* 25.8* 23.7* 27.0* 26.1* 28.0*  MCV 93.3   < > 93.9 94.2 94.0 90.6 90.0 90.6  PLT 337   < > 300 233 221 217 231 258   < > = values in this interval not displayed.   Basic Metabolic Panel: Recent Labs  Lab 05/12/20 0403 05/13/20 0114 05/15/20 0759 05/16/20 0158 05/17/20 0128  NA 137 135 135 135 136  K 3.9 4.1 3.4* 3.9 3.9  CL 106 104 98 100 102  CO2 20* 21* _0 GLUCOSE 145* 133* 97 112* 103*  BUN _0 CREATININE 1.03* 0.82 0.60 0.75 0.76  CALCIUM 7.9* 8.2* 8.6* 8.8* 9.1   GFR: Estimated Creatinine Clearance: 71.8 mL/min (by C-G formula based on SCr of 0.76 mg/dL). Liver Function Tests: Recent Labs  Lab 05/12/20 0403 05/13/20 0114 05/15/20 0759 05/16/20 0158 05/17/20 0128  AST 17 36 98* 89* 92*  ALT  19 25 99* 105* 113*  ALKPHOS 76 72 113 124 133*  BILITOT 0.4 0.6 0.4 0.4 0.5  PROT 5.6* 5.5* 6.0* 6.3* 6.2*  ALBUMIN 2.2* 2.1* 2.3* 2.3* 2.5*   No results for input(s): LIPASE, AMYLASE in the last 168 hours. No results for input(s): AMMONIA in the last 168 hours. Coagulation Profile: No results for input(s): INR, PROTIME in the last 168 hours. Cardiac Enzymes: No results for input(s): CKTOTAL, CKMB, CKMBINDEX, TROPONINI in the last 168 hours. BNP (last 3 results) No results for input(s): PROBNP in the last 8760 hours. HbA1C: No results for input(s): HGBA1C in the last 72 hours. CBG: Recent Labs  Lab 05/11/20 1635 05/11/20 2107 05/12/20 0551 05/12/20 1139 05/12/20 1703  GLUCAP 155* 113* 120* 111* 94   Lipid Profile: No results for input(s): CHOL, HDL, LDLCALC, TRIG, CHOLHDL, LDLDIRECT in the last 72 hours. Thyroid Function Tests: No results for input(s): TSH, T4TOTAL, FREET4, T3FREE, THYROIDAB in the last 72 hours. Anemia Panel: No results for input(s): VITAMINB12, FOLATE, FERRITIN, TIBC, IRON, RETICCTPCT in the last 72 hours. Urine analysis:    Component Value Date/Time   COLORURINE YELLOW 05/10/2020 1739   APPEARANCEUR CLOUDY (A) 05/10/2020 1739   LABSPEC 1.011 05/10/2020 1739   PHURINE 6.0 05/10/2020 1739   GLUCOSEU NEGATIVE 05/10/2020 1739   HGBUR MODERATE (A) 05/10/2020 1739   BILIRUBINUR NEGATIVE 05/10/2020 1739   KETONESUR NEGATIVE 05/10/2020 1739   PROTEINUR 100 (A) 05/10/2020 1739   UROBILINOGEN 0.2 10/26/2014 2340   NITRITE POSITIVE (A) 05/10/2020 1739   LEUKOCYTESUR LARGE (A) 05/10/2020 1739   Sepsis Labs: _1 (procalcitonin:4,lacticacidven:4)  ) Recent Results (from the past 240 hour(s))  Culture, Urine     Status: Abnormal   Collection Time: 05/10/20  5:39 PM   Specimen: Urine, Clean Catch  Result Value Ref Range Status   Specimen Description URINE, CLEAN CATCH  Final   Special Requests   Final    NONE Performed at [facility],  [place] [address], [place], [place] [address]    Culture (A)  Final    >=100,000 COLONIES/mL ESCHERICHIA COLI Confirmed Extended Spectrum Beta-Lactamase Producer (ESBL).  In bloodstream infections from ESBL organisms, carbapenems are preferred over piperacillin/tazobactam. They are shown to have a lower risk of mortality.    Report Status 05/12/2020 FINAL  Final   Organism ID, Bacteria ESCHERICHIA COLI (A)  Final      Susceptibility   Escherichia coli - MIC*    AMPICILLIN >=32 RESISTANT Resistant     CEFAZOLIN >=64 RESISTANT Resistant     CEFEPIME 16 RESISTANT Resistant     CEFTRIAXONE >=64 RESISTANT Resistant     CIPROFLOXACIN >=4 RESISTANT Resistant     GENTAMICIN >=16 RESISTANT Resistant     IMIPENEM <=0.25 SENSITIVE Sensitive     NITROFURANTOIN 128 RESISTANT Resistant     TRIMETH/SULFA >=320 RESISTANT Resistant     AMPICILLIN/SULBACTAM >=32 RESISTANT Resistant     PIP/TAZO 16 SENSITIVE Sensitive     * >=100,000 COLONIES/mL ESCHERICHIA COLI  Culture, blood (routine x 2)     Status: Abnormal   Collection Time: 05/11/20  5:37 PM  Specimen: BLOOD RIGHT HAND  Result Value Ref Range Status   Specimen Description BLOOD RIGHT HAND  Final   Special Requests   Final    BOTTLES DRAWN AEROBIC AND ANAEROBIC Blood Culture adequate volume   Culture  Setup Time   Final    GRAM NEGATIVE RODS IN BOTH AEROBIC AND ANAEROBIC BOTTLES CRITICAL RESULT CALLED TO, READ BACK BY AND VERIFIED WITH: [place] F. [phone] FCP Performed at [facility], [place] [address], [place], [place] [address]    Culture (A)  Final    ESCHERICHIA COLI Confirmed Extended Spectrum Beta-Lactamase Producer (ESBL).  In bloodstream infections from ESBL organisms, carbapenems are preferred over piperacillin/tazobactam. They are shown to have a lower risk of mortality.    Report Status 05/14/2020 FINAL  Final   Organism ID, Bacteria ESCHERICHIA COLI  Final      Susceptibility   Escherichia coli - MIC*    AMPICILLIN  >=32 RESISTANT Resistant     CEFAZOLIN >=64 RESISTANT Resistant     CEFEPIME >=32 RESISTANT Resistant     CEFTAZIDIME >=64 RESISTANT Resistant     CEFTRIAXONE >=64 RESISTANT Resistant     CIPROFLOXACIN >=4 RESISTANT Resistant     GENTAMICIN >=16 RESISTANT Resistant     IMIPENEM <=0.25 SENSITIVE Sensitive     TRIMETH/SULFA >=320 RESISTANT Resistant     AMPICILLIN/SULBACTAM >=32 RESISTANT Resistant     PIP/TAZO 16 SENSITIVE Sensitive     * ESCHERICHIA COLI  Blood Culture ID Panel (Reflexed)     Status: Abnormal   Collection Time: 05/11/20  5:37 PM  Result Value Ref Range Status   Enterococcus faecalis NOT DETECTED NOT DETECTED Final   Enterococcus Faecium NOT DETECTED NOT DETECTED Final   Listeria monocytogenes NOT DETECTED NOT DETECTED Final   Staphylococcus species NOT DETECTED NOT DETECTED Final   Staphylococcus aureus (BCID) NOT DETECTED NOT DETECTED Final   Staphylococcus epidermidis NOT DETECTED NOT DETECTED Final   Staphylococcus lugdunensis NOT DETECTED NOT DETECTED Final   Streptococcus species NOT DETECTED NOT DETECTED Final   Streptococcus agalactiae NOT DETECTED NOT DETECTED Final   Streptococcus pneumoniae NOT DETECTED NOT DETECTED Final   Streptococcus pyogenes NOT DETECTED NOT DETECTED Final   A.calcoaceticus-baumannii NOT DETECTED NOT DETECTED Final   Bacteroides fragilis NOT DETECTED NOT DETECTED Final   Enterobacterales DETECTED (A) NOT DETECTED Final    Comment: Enterobacterales represent a large order of gram negative bacteria, not a single organism. CRITICAL RESULT CALLED TO, READ BACK BY AND VERIFIED WITH: PHARMD JEREMY F. [phone] FCP    Enterobacter cloacae complex NOT DETECTED NOT DETECTED Final   Escherichia coli DETECTED (A) NOT DETECTED Final    Comment: CRITICAL RESULT CALLED TO, READ BACK BY AND VERIFIED WITH: PHARMD JEREMY F. [phone] FCP    Klebsiella aerogenes NOT DETECTED NOT DETECTED Final   Klebsiella oxytoca NOT DETECTED NOT DETECTED  Final   Klebsiella pneumoniae NOT DETECTED NOT DETECTED Final   Proteus species NOT DETECTED NOT DETECTED Final   Salmonella species NOT DETECTED NOT DETECTED Final   Serratia marcescens NOT DETECTED NOT DETECTED Final   Haemophilus influenzae NOT DETECTED NOT DETECTED Final   Neisseria meningitidis NOT DETECTED NOT DETECTED Final   Pseudomonas aeruginosa NOT DETECTED NOT DETECTED Final   Stenotrophomonas maltophilia NOT DETECTED NOT DETECTED Final   Candida albicans NOT DETECTED NOT DETECTED Final   Candida auris NOT DETECTED NOT DETECTED Final   Candida glabrata NOT DETECTED NOT DETECTED Final   Candida krusei  NOT DETECTED NOT DETECTED Final   Candida parapsilosis NOT DETECTED NOT DETECTED Final   Candida tropicalis NOT DETECTED NOT DETECTED Final   Cryptococcus neoformans/gattii NOT DETECTED NOT DETECTED Final   CTX-M ESBL DETECTED (A) NOT DETECTED Final    Comment: CRITICAL RESULT CALLED TO, READ BACK BY AND VERIFIED WITH: PHARMD JEREMY F. [phone] FCP (NOTE) Extended spectrum beta-lactamase detected. Recommend a carbapenem as initial therapy.      Carbapenem resistance IMP NOT DETECTED NOT DETECTED Final   Carbapenem resistance KPC NOT DETECTED NOT DETECTED Final   Carbapenem resistance NDM NOT DETECTED NOT DETECTED Final   Carbapenem resist OXA 48 LIKE NOT DETECTED NOT DETECTED Final   Carbapenem resistance VIM NOT DETECTED NOT DETECTED Final    Comment: Performed at [facility], [place] [address] [place], [place] [address]  Culture, blood (routine x 2)     Status: Abnormal   Collection Time: 05/11/20  5:51 PM   Specimen: BLOOD  Result Value Ref Range Status   Specimen Description BLOOD RIGHT ANTECUBITAL  Final   Special Requests   Final    BOTTLES DRAWN AEROBIC AND ANAEROBIC Blood Culture adequate volume   Culture  Setup Time   Final    GRAM NEGATIVE RODS IN BOTH AEROBIC AND ANAEROBIC BOTTLES CRITICAL VALUE NOTED.  VALUE IS CONSISTENT WITH PREVIOUSLY REPORTED  AND CALLED VALUE.    Culture (A)  Final    ESCHERICHIA COLI SUSCEPTIBILITIES PERFORMED ON PREVIOUS CULTURE WITHIN THE LAST 5 DAYS. Performed at [facility], [place] [address] [place], [place] [address]    Report Status 05/14/2020 FINAL  Final  Resp panel by RT-PCR (RSV, Flu A&B, Covid) Nasopharyngeal Swab     Status: None   Collection Time: 05/12/20  6:06 PM   Specimen: Nasopharyngeal Swab; Nasopharyngeal(NP) swabs in vial transport medium  Result Value Ref Range Status   SARS Coronavirus 2 by RT PCR NEGATIVE NEGATIVE Final    Comment: (NOTE) SARS-CoV-2 target nucleic acids are NOT DETECTED.  The SARS-CoV-2 RNA is generally detectable in upper respiratory specimens during the acute phase of infection. The lowest concentration of SARS-CoV-2 viral copies this assay can detect is 138 copies/mL. A negative result does not preclude SARS-Cov-2 infection and should not be used as the sole basis for treatment or other patient management decisions. A negative result may occur with  improper specimen collection/handling, submission of specimen other than nasopharyngeal swab, presence of viral mutation(s) within the areas targeted by this assay, and inadequate number of viral copies(<138 copies/mL). A negative result must be combined with clinical observations, patient history, and epidemiological information. The expected result is Negative.  Fact Sheet for Patients:  EntrepreneurPulse.com.au  Fact Sheet for Healthcare Providers:  IncredibleEmployment.be  This test is no t yet approved or cleared by the [place] FDA and  has been authorized for detection and/or diagnosis of SARS-CoV-2 by FDA under an Emergency Use Authorization (EUA). This EUA will remain  in effect (meaning this test can be used) for the duration of the COVID-19 declaration under Section 564(b)(1) of the Act, 21 U.S.C.section 360bbb-3(b)(1), unless the authorization is  terminated  or revoked sooner.       Influenza A by PCR NEGATIVE NEGATIVE Final   Influenza B by PCR NEGATIVE NEGATIVE Final    Comment: (NOTE) The Xpert Xpress SARS-CoV-2/FLU/RSV plus assay is intended as an aid in the diagnosis of influenza from Nasopharyngeal swab specimens and should not be used as a sole basis for  treatment. Nasal washings and aspirates are unacceptable for Xpert Xpress SARS-CoV-2/FLU/RSV testing.  Fact Sheet for Patients: EntrepreneurPulse.com.au  Fact Sheet for Healthcare Providers: IncredibleEmployment.be  This test is not yet approved or cleared by the [place] FDA and has been authorized for detection and/or diagnosis of SARS-CoV-2 by FDA under an Emergency Use Authorization (EUA). This EUA will remain in effect (meaning this test can be used) for the duration of the COVID-19 declaration under Section 564(b)(1) of the Act, 21 U.S.C. section 360bbb-3(b)(1), unless the authorization is terminated or revoked.     Resp Syncytial Virus by PCR NEGATIVE NEGATIVE Final    Comment: (NOTE) Fact Sheet for Patients: EntrepreneurPulse.com.au  Fact Sheet for Healthcare Providers: IncredibleEmployment.be  This test is not yet approved or cleared by the [place] FDA and has been authorized for detection and/or diagnosis of SARS-CoV-2 by FDA under an Emergency Use Authorization (EUA). This EUA will remain in effect (meaning this test can be used) for the duration of the COVID-19 declaration under Section 564(b)(1) of the Act, 21 U.S.C. section 360bbb-3(b)(1), unless the authorization is terminated or revoked.  Performed at [facility], [place] [address], [place], [place] [address]          Radiology Studies: No results found.      Scheduled Meds: . atorvastatin  20 mg Oral QPM  . benzonatate  200 mg Oral TID  . chlorhexidine  15 mL Mouth Rinse BID  .  clonazepam  0.25 mg Oral BID  . divalproex  1,000 mg Oral QHS  . [START ON 05/18/2020] divalproex  500 mg Oral Daily  . docusate sodium  100 mg Oral BID  . enoxaparin (LOVENOX) injection  40 mg Subcutaneous Q24H  . feeding supplement  237 mL Oral TID BM  . lacosamide  200 mg Oral BID  . [START ON 05/18/2020] LORazepam  0.5 mg Oral Q6H  . LORazepam  1 mg Oral Q6H  . mouth rinse  15 mL Mouth Rinse q12n4p  . mouth rinse  15 mL Mouth Rinse q12n4p  . metoprolol tartrate  50 mg Oral BID  . mometasone-formoterol  2 puff Inhalation BID  . multivitamin with minerals  1 tablet Oral Daily  . pantoprazole  40 mg Oral QHS  . QUEtiapine  25 mg Oral BID  . sodium chloride flush  3 mL Intravenous Q12H   Continuous Infusions: . ertapenem 1,000 mg (05/17/20 1519)     LOS: 5 days    Time spent: 25 minutes   Kenzley Ke Manning Charity, MD [facility] 05/17/2020, 6:09 PM     Please page though [place] or Epic secure chat:  For [employer], Adult nurse

## 2020-05-17 NOTE — Consult Note (Signed)
   [facility] CM Inpatient Consult   05/17/2020  Maria [name] 11-09-1960 [id number]  [facility] [ACO] Patient: Medicare    Patient screened for high risk score for unplanned readmission risk and with less than 7 days readmission to acute hospital to check if potential [facility] service needs.  Review of patient's medical record reveals patient was readmitted back to acute hospital from [place] [ was previously 05/05/20 til 05/03/20 in [place] noted.  Primary Care Provider is Wende Neighbors, MD this provider is listed to provide the transition of care [TOC] for post hospital follow up.  Plan: Continue to follow progress and disposition to assess for post hospital care management needs.  Currently, patient is being recommended to return to an inpatient rehabilitation level of care for transition.  For questions contact:   Natividad Brood, RN BSN [facility] Liaison  [phone] business mobile phone Toll free office [phone]  Fax number: [phone] [email or web address]

## 2020-05-18 ENCOUNTER — Other Ambulatory Visit: Payer: Self-pay

## 2020-05-18 ENCOUNTER — Encounter (HOSPITAL_COMMUNITY): Payer: Self-pay | Admitting: Physical Medicine & Rehabilitation

## 2020-05-18 ENCOUNTER — Inpatient Hospital Stay (HOSPITAL_COMMUNITY)
Admission: RE | Admit: 2020-05-18 | Discharge: 2020-06-01 | DRG: 945 | Disposition: A | Discharge status: Home Health | Payer: Medicare Other | Source: Intra-hospital | Attending: Physical Medicine & Rehabilitation | Admitting: Physical Medicine & Rehabilitation

## 2020-05-18 DIAGNOSIS — N390 Urinary tract infection, site not specified: Secondary | ICD-10-CM | POA: Diagnosis not present

## 2020-05-18 DIAGNOSIS — D649 Anemia, unspecified: Secondary | ICD-10-CM | POA: Diagnosis present

## 2020-05-18 DIAGNOSIS — F419 Anxiety disorder, unspecified: Secondary | ICD-10-CM | POA: Diagnosis present

## 2020-05-18 DIAGNOSIS — R569 Unspecified convulsions: Secondary | ICD-10-CM

## 2020-05-18 DIAGNOSIS — I5032 Chronic diastolic (congestive) heart failure: Secondary | ICD-10-CM | POA: Diagnosis present

## 2020-05-18 DIAGNOSIS — Z79899 Other long term (current) drug therapy: Secondary | ICD-10-CM | POA: Diagnosis not present

## 2020-05-18 DIAGNOSIS — K219 Gastro-esophageal reflux disease without esophagitis: Secondary | ICD-10-CM | POA: Diagnosis present

## 2020-05-18 DIAGNOSIS — I959 Hypotension, unspecified: Secondary | ICD-10-CM | POA: Diagnosis present

## 2020-05-18 DIAGNOSIS — Z90710 Acquired absence of both cervix and uterus: Secondary | ICD-10-CM | POA: Diagnosis not present

## 2020-05-18 DIAGNOSIS — F79 Unspecified intellectual disabilities: Secondary | ICD-10-CM | POA: Diagnosis present

## 2020-05-18 DIAGNOSIS — B9620 Unspecified Escherichia coli [E. coli] as the cause of diseases classified elsewhere: Secondary | ICD-10-CM | POA: Diagnosis present

## 2020-05-18 DIAGNOSIS — A499 Bacterial infection, unspecified: Secondary | ICD-10-CM | POA: Diagnosis not present

## 2020-05-18 DIAGNOSIS — Z833 Family history of diabetes mellitus: Secondary | ICD-10-CM | POA: Diagnosis not present

## 2020-05-18 DIAGNOSIS — R7881 Bacteremia: Secondary | ICD-10-CM | POA: Diagnosis present

## 2020-05-18 DIAGNOSIS — G40909 Epilepsy, unspecified, not intractable, without status epilepticus: Secondary | ICD-10-CM | POA: Diagnosis present

## 2020-05-18 DIAGNOSIS — A419 Sepsis, unspecified organism: Secondary | ICD-10-CM | POA: Diagnosis present

## 2020-05-18 DIAGNOSIS — N12 Tubulo-interstitial nephritis, not specified as acute or chronic: Secondary | ICD-10-CM | POA: Diagnosis present

## 2020-05-18 DIAGNOSIS — N261 Atrophy of kidney (terminal): Secondary | ICD-10-CM | POA: Diagnosis not present

## 2020-05-18 DIAGNOSIS — I700 Atherosclerosis of aorta: Secondary | ICD-10-CM | POA: Diagnosis not present

## 2020-05-18 DIAGNOSIS — Z87891 Personal history of nicotine dependence: Secondary | ICD-10-CM | POA: Diagnosis not present

## 2020-05-18 DIAGNOSIS — Z1612 Extended spectrum beta lactamase (ESBL) resistance: Secondary | ICD-10-CM | POA: Diagnosis present

## 2020-05-18 DIAGNOSIS — N179 Acute kidney failure, unspecified: Secondary | ICD-10-CM | POA: Diagnosis present

## 2020-05-18 DIAGNOSIS — R5381 Other malaise: Principal | ICD-10-CM | POA: Diagnosis present

## 2020-05-18 DIAGNOSIS — Z8249 Family history of ischemic heart disease and other diseases of the circulatory system: Secondary | ICD-10-CM | POA: Diagnosis not present

## 2020-05-18 DIAGNOSIS — K5909 Other constipation: Secondary | ICD-10-CM | POA: Diagnosis present

## 2020-05-18 DIAGNOSIS — R059 Cough, unspecified: Secondary | ICD-10-CM | POA: Diagnosis present

## 2020-05-18 DIAGNOSIS — E440 Moderate protein-calorie malnutrition: Secondary | ICD-10-CM | POA: Diagnosis not present

## 2020-05-18 DIAGNOSIS — N151 Renal and perinephric abscess: Secondary | ICD-10-CM | POA: Diagnosis not present

## 2020-05-18 LAB — CBC
HCT: 28.9 % — ABNORMAL LOW (ref 36.0–46.0)
Hemoglobin: 9.3 g/dL — ABNORMAL LOW (ref 12.0–15.0)
MCH: 29.7 pg (ref 26.0–34.0)
MCHC: 32.2 g/dL (ref 30.0–36.0)
MCV: 92.3 fL (ref 80.0–100.0)
Platelets: 274 10*3/uL (ref 150–400)
RBC: 3.13 MIL/uL — ABNORMAL LOW (ref 3.87–5.11)
RDW: 16.4 % — ABNORMAL HIGH (ref 11.5–15.5)
WBC: 9.1 10*3/uL (ref 4.0–10.5)
nRBC: 0 % (ref 0.0–0.2)

## 2020-05-18 LAB — COMPREHENSIVE METABOLIC PANEL
ALT: 104 U/L — ABNORMAL HIGH (ref 0–44)
AST: 72 U/L — ABNORMAL HIGH (ref 15–41)
Albumin: 2.5 g/dL — ABNORMAL LOW (ref 3.5–5.0)
Alkaline Phosphatase: 136 U/L — ABNORMAL HIGH (ref 38–126)
Anion gap: 11 (ref 5–15)
BUN: 13 mg/dL (ref 6–20)
CO2: 25 mmol/L (ref 22–32)
Calcium: 9.2 mg/dL (ref 8.9–10.3)
Chloride: 103 mmol/L (ref 98–111)
Creatinine, Ser: 0.75 mg/dL (ref 0.44–1.00)
GFR, Estimated: >60 mL/min (ref >60)
Glucose, Bld: 102 mg/dL — ABNORMAL HIGH (ref 70–99)
Potassium: 3.9 mmol/L (ref 3.5–5.1)
Sodium: 139 mmol/L (ref 135–145)
Total Bilirubin: 0.8 mg/dL (ref 0.3–1.2)
Total Protein: 6.3 g/dL — ABNORMAL LOW (ref 6.5–8.1)

## 2020-05-18 LAB — VALPROIC ACID LEVEL: Valproic Acid Lvl: <10 ug/mL — ABNORMAL LOW (ref 50.0–100.0)

## 2020-05-18 MED ORDER — ADULT MULTIVITAMIN W/MINERALS CH
1.0000 | ORAL_TABLET | Freq: Every day | ORAL | Status: DC
  Administered 2020-05-19 – 2020-06-01 (×14): 1 via ORAL
  Filled 2020-05-18 (×14): qty 1

## 2020-05-18 MED ORDER — DIVALPROEX SODIUM 500 MG PO DR TAB
500.0000 mg | DELAYED_RELEASE_TABLET | Freq: Every day | ORAL | Status: DC
  Administered 2020-05-19 – 2020-05-21 (×3): 500 mg via ORAL
  Filled 2020-05-18 (×3): qty 1

## 2020-05-18 MED ORDER — CLONAZEPAM 0.25 MG PO TBDP
0.2500 mg | ORAL_TABLET | Freq: Two times a day (BID) | ORAL | Status: DC
  Administered 2020-05-18 – 2020-06-01 (×28): 0.25 mg via ORAL
  Filled 2020-05-18 (×28): qty 1

## 2020-05-18 MED ORDER — ALUM & MAG HYDROXIDE-SIMETH 200-200-20 MG/5ML PO SUSP: 30.0000 mL | ORAL | Status: DC | PRN

## 2020-05-18 MED ORDER — ONDANSETRON HCL 4 MG/2ML IJ SOLN: 4.0000 mg | Freq: Four times a day (QID) | INTRAMUSCULAR | Status: DC | PRN

## 2020-05-18 MED ORDER — IPRATROPIUM-ALBUTEROL 0.5-2.5 (3) MG/3ML IN SOLN: 3.0000 mL | RESPIRATORY_TRACT | Status: DC | PRN

## 2020-05-18 MED ORDER — DIVALPROEX SODIUM 500 MG PO DR TAB: 1000.0000 mg | DELAYED_RELEASE_TABLET | Freq: Every day | ORAL | 0 refills | Status: DC

## 2020-05-18 MED ORDER — LIP MEDEX EX OINT: TOPICAL_OINTMENT | CUTANEOUS | Status: DC | PRN

## 2020-05-18 MED ORDER — SODIUM CHLORIDE 0.9 % IV SOLN: 1.0000 g | INTRAVENOUS | 0 refills | Status: DC

## 2020-05-18 MED ORDER — POLYETHYLENE GLYCOL 3350 17 G PO PACK
17.0000 g | PACK | Freq: Every day | ORAL | Status: DC | PRN
  Administered 2020-05-21: 16:00:00 17 g via ORAL
  Filled 2020-05-18: qty 1

## 2020-05-18 MED ORDER — ENOXAPARIN SODIUM 40 MG/0.4ML ~~LOC~~ SOLN
40.0000 mg | SUBCUTANEOUS | Status: DC
  Administered 2020-05-19 – 2020-06-01 (×14): 40 mg via SUBCUTANEOUS
  Filled 2020-05-18 (×14): qty 0.4

## 2020-05-18 MED ORDER — MOMETASONE FURO-FORMOTEROL FUM 200-5 MCG/ACT IN AERO
2.0000 | INHALATION_SPRAY | Freq: Two times a day (BID) | RESPIRATORY_TRACT | Status: DC
  Administered 2020-05-19 – 2020-06-01 (×25): 2 via RESPIRATORY_TRACT
  Filled 2020-05-18: qty 8.8

## 2020-05-18 MED ORDER — QUETIAPINE FUMARATE 25 MG PO TABS: 25.0000 mg | ORAL_TABLET | Freq: Two times a day (BID) | ORAL | 0 refills | Status: DC

## 2020-05-18 MED ORDER — PHENOL 1.4 % MT LIQD: 1.0000 | Freq: Four times a day (QID) | OROMUCOSAL | Status: DC | PRN

## 2020-05-18 MED ORDER — ENSURE ENLIVE PO LIQD
237.0000 mL | Freq: Three times a day (TID) | ORAL | Status: DC
  Administered 2020-05-18 – 2020-06-01 (×17): 237 mL via ORAL
  Filled 2020-05-18 (×2): qty 237

## 2020-05-18 MED ORDER — PROCHLORPERAZINE 25 MG RE SUPP: 12.5000 mg | Freq: Four times a day (QID) | RECTAL | Status: DC | PRN

## 2020-05-18 MED ORDER — DIVALPROEX SODIUM 500 MG PO DR TAB: 500.0000 mg | DELAYED_RELEASE_TABLET | Freq: Every day | ORAL | 0 refills | Status: DC

## 2020-05-18 MED ORDER — BISACODYL 10 MG RE SUPP
10.0000 mg | Freq: Every day | RECTAL | Status: DC | PRN
Start: 2020-05-18 — End: 2020-06-01

## 2020-05-18 MED ORDER — ACETAMINOPHEN 325 MG PO TABS
325.0000 mg | ORAL_TABLET | ORAL | Status: DC | PRN
  Administered 2020-05-18: 650 mg via ORAL
  Filled 2020-05-18: qty 2

## 2020-05-18 MED ORDER — CHLORHEXIDINE GLUCONATE 0.12 % MT SOLN
15.0000 mL | Freq: Two times a day (BID) | OROMUCOSAL | Status: DC
  Administered 2020-05-18 – 2020-06-01 (×25): 15 mL via OROMUCOSAL
  Filled 2020-05-18 (×28): qty 15

## 2020-05-18 MED ORDER — QUETIAPINE FUMARATE 25 MG PO TABS
25.0000 mg | ORAL_TABLET | Freq: Two times a day (BID) | ORAL | Status: DC
  Administered 2020-05-18 – 2020-06-01 (×28): 25 mg via ORAL
  Filled 2020-05-18 (×28): qty 1

## 2020-05-18 MED ORDER — MELATONIN 3 MG PO TABS: 3.0000 mg | ORAL_TABLET | Freq: Every evening | ORAL | Status: DC | PRN

## 2020-05-18 MED ORDER — LORAZEPAM 2 MG/ML IJ SOLN: 2.0000 mg | INTRAMUSCULAR | Status: DC | PRN

## 2020-05-18 MED ORDER — DIVALPROEX SODIUM 500 MG PO DR TAB
1000.0000 mg | DELAYED_RELEASE_TABLET | Freq: Every day | ORAL | Status: DC
  Administered 2020-05-18 – 2020-05-28 (×11): 1000 mg via ORAL
  Filled 2020-05-18 (×11): qty 2

## 2020-05-18 MED ORDER — TRAZODONE HCL 50 MG PO TABS: 25.0000 mg | ORAL_TABLET | Freq: Every evening | ORAL | Status: DC | PRN

## 2020-05-18 MED ORDER — PROCHLORPERAZINE MALEATE 5 MG PO TABS: 5.0000 mg | ORAL_TABLET | Freq: Four times a day (QID) | ORAL | Status: DC | PRN

## 2020-05-18 MED ORDER — PROCHLORPERAZINE EDISYLATE 10 MG/2ML IJ SOLN: 5.0000 mg | Freq: Four times a day (QID) | INTRAMUSCULAR | Status: DC | PRN

## 2020-05-18 MED ORDER — GUAIFENESIN-DM 100-10 MG/5ML PO SYRP: 5.0000 mL | ORAL_SOLUTION | Freq: Four times a day (QID) | ORAL | Status: DC | PRN

## 2020-05-18 MED ORDER — ATORVASTATIN CALCIUM 10 MG PO TABS
20.0000 mg | ORAL_TABLET | Freq: Every evening | ORAL | Status: DC
  Administered 2020-05-18 – 2020-05-31 (×14): 20 mg via ORAL
  Filled 2020-05-18 (×14): qty 2

## 2020-05-18 MED ORDER — LORAZEPAM 0.5 MG PO TABS
0.5000 mg | ORAL_TABLET | Freq: Four times a day (QID) | ORAL | Status: AC
  Administered 2020-05-18 – 2020-05-20 (×6): 0.5 mg via ORAL
  Filled 2020-05-18 (×6): qty 1

## 2020-05-18 MED ORDER — SODIUM CHLORIDE 0.9 % IV SOLN
1.0000 g | INTRAVENOUS | Status: DC
  Administered 2020-05-18 – 2020-05-30 (×13): 1000 mg via INTRAVENOUS
  Filled 2020-05-18 (×16): qty 1

## 2020-05-18 MED ORDER — METOPROLOL TARTRATE 50 MG PO TABS: 50.0000 mg | ORAL_TABLET | Freq: Two times a day (BID) | ORAL | 0 refills | Status: DC

## 2020-05-18 MED ORDER — DOCUSATE SODIUM 100 MG PO CAPS
100.0000 mg | ORAL_CAPSULE | Freq: Two times a day (BID) | ORAL | Status: DC
  Administered 2020-05-18 – 2020-06-01 (×28): 100 mg via ORAL
  Filled 2020-05-18 (×29): qty 1

## 2020-05-18 MED ORDER — BENZONATATE 100 MG PO CAPS
200.0000 mg | ORAL_CAPSULE | Freq: Three times a day (TID) | ORAL | Status: DC
  Administered 2020-05-18 – 2020-06-01 (×41): 200 mg via ORAL
  Filled 2020-05-18 (×43): qty 2

## 2020-05-18 MED ORDER — LACOSAMIDE 50 MG PO TABS
200.0000 mg | ORAL_TABLET | Freq: Two times a day (BID) | ORAL | Status: DC
  Administered 2020-05-18 – 2020-06-01 (×28): 200 mg via ORAL
  Filled 2020-05-18 (×27): qty 4

## 2020-05-18 MED ORDER — DIPHENHYDRAMINE HCL 12.5 MG/5ML PO ELIX: 12.5000 mg | ORAL_SOLUTION | Freq: Four times a day (QID) | ORAL | Status: DC | PRN

## 2020-05-18 MED ORDER — ORAL CARE MOUTH RINSE
15.0000 mL | Freq: Two times a day (BID) | OROMUCOSAL | Status: DC
  Administered 2020-05-19 – 2020-06-01 (×16): 15 mL via OROMUCOSAL

## 2020-05-18 MED ORDER — FLEET ENEMA 7-19 GM/118ML RE ENEM: 1.0000 | ENEMA | Freq: Once | RECTAL | Status: DC | PRN

## 2020-05-18 MED ORDER — METOPROLOL TARTRATE 50 MG PO TABS
50.0000 mg | ORAL_TABLET | Freq: Two times a day (BID) | ORAL | Status: DC
  Administered 2020-05-18 – 2020-05-30 (×23): 50 mg via ORAL
  Filled 2020-05-18 (×24): qty 1

## 2020-05-18 MED ORDER — PANTOPRAZOLE SODIUM 40 MG PO TBEC
40.0000 mg | DELAYED_RELEASE_TABLET | Freq: Every day | ORAL | Status: DC
  Administered 2020-05-18 – 2020-05-31 (×14): 40 mg via ORAL
  Filled 2020-05-18 (×13): qty 1

## 2020-05-18 NOTE — Progress Notes (Signed)
[name], pt sister, called and was upset that was told they could have 2 visitors. Explained that we only allow one person per day to visit unless there is a medical concern. Requested to speak to MD. Discussed with PA and confirmed that will only have 1 visitor per day. [name] informed me that needs MD to call her to discuss and she reports was told that would be the same as it was when was here last time and they were allowed more than one visitor.  Informed her that will leave note to contact her.     Previously when in rehab [name] was informed by charge nurse that only visitor a day was allowed.

## 2020-05-18 NOTE — PMR Pre-admission (Signed)
PMR Admission Coordinator Pre-Admission Assessment  Patient: Maria [name] is an 59 y.o., female MRN: [id number] DOB: 18-Nov-1960 Height: 5' 6"  (167.6 cm) Weight: 58 kg  Insurance Information HMO:     PPO:      PCP:      IPA:      80/20:      OTHER:  PRIMARY: Medicare a and b      Policy#: [id number]      Subscriber: pt Benefits:  Phone #: passport one online     Name: 05/02/2020 Eff. Date: 06/03/1994     Deduct: $1484      Out of Pocket Max: none      Life Max: none CIR: 1005      SNF: 20 full days Outpatient: 80%     Co-Pay: 20% Home Health: 100%      Co-Pay: none DME: 80%     Co-Pay: 20% Providers: pt choice  SECONDARY: Tricare for Life      Policy#: [id number]      Financial Counselor:       Phone#:   The "Data Collection Information Summary" for patients in Inpatient Rehabilitation Facilities with attached "Privacy Act [place] Records" was provided and verbally reviewed with: Family  Emergency Contact Information Contact Information    Name Relation Home Work Mobile   [place] Mother [phone]  [phone]   [facility] Sister [phone]     Hadar, Elgersma   [phone]      Current Medical History  Patient Admitting Diagnosis: Debility due to sepsis  History of Present Illness: 59 year old female with past medical history of intellectual delay, seizure disorder, chronic diastolic CHF, GERD, chronic constipation. Originally admitted 04/14/2020 at [facility]  Diagnosed with septic shock requiring vasopressors, presumed form urinary source and transferred to [facility] ICU. CT san  suspicious for pyelonephritis and small abscess in the lower pole of th left kidney. Follow-up ultrasound 11 days later showed stable lesion felt to represent a small perinephric abscess versus renal cyst. Blood cultures drawn after antibiotics were negative but urine culture grew E. coli-not ESBL. Completed 14-day course of ceftriaxone. Hospital course  complicated by breakthrough seizures, delirium and agitation requiring Precedex drip. Urology did not feel the abscess required any further intervention. Subsequently admitted to CIR on 05/05/2020. She was doing well until 05/10/2020 when she developed fever, UA suggestive of UTI, urine culture grew E. coli, briefly on Keflex, TRH consulted 12/9, final cultures were not back, treated with aggressive IV fluids due to sepsis, elevated lactate and AKI and changed to IV ceftriaxone. BCID 12/10+ for gram-negative rods and final urine culture shows ESBL E. coli. Antibiotics changed to IV meropenem, ID consulted. Patient transferring to acute [facility] for close monitoring and management on 05/12/2020.  Sepsis has resolved.Responded well to meropenem and switched to Invanz as per ID-Recent imaging shows evolving 12 mm likely abscess.  No distinct fluid collection that looked amenable to drainage on CT.ID hasdiscussed with IR on 12/13 and it is not amenable for drainage, plan is to continue on IV Invanz.She needsCT scan in about 2 weeks around 05/26/2020 to reassess the renal abscess that was not drained to guide duration of therapy.  She can continue Invanz inCIR.To check ESR/CRP next week.Monitor for further seizure activity while on Invanz.  Klonopin and Seroquel adjusted to minimize sedation. Multiple episodes of nausea needing Zofran and Reglan. On Dysphagia 3 diet as tolerated. History of breakthrough seizures continued current AED with Vimpat, Valproate Klonopin. Had another  episode of seizure activity on acute. Neurology consulted and to continue to taper doses of Ativan. Lovenox for DVT prophylaxis.   Patient's medical record from [facility]  has been reviewed by the rehabilitation admission coordinator and physician.  Past Medical History  Past Medical History:  Diagnosis Date  . Constipation   . Dysphagia   . Fracture    R foot  . GERD (gastroesophageal reflux disease)   . History  of shingles 10/2016  . Mental retardation    lesion in head  . Seizures ([place])    "not fully controlled on max doses of meds" (Neuro ofc note 12/2014)  . Thrombocytopenia ([place])   . Tremor     Family History   family history includes Diabetes in her father; High Cholesterol in her mother; High blood pressure in her mother.  Prior Rehab/Hospitalizations Has the patient had prior rehab or hospitalizations prior to admission? Yes CIR 05/2020 prior to readmit to acute  Has the patient had major surgery during 100 days prior to admission? No   Current Medications  Current Facility-Administered Medications:  .  acetaminophen (TYLENOL) suppository 650 mg, 650 mg, Rectal, Q4H PRN, Modena Jansky, MD, 650 mg at 05/13/20 0256 .  acetaminophen (TYLENOL) tablet 650 mg, 650 mg, Oral, Q4H PRN, Modena Jansky, MD, 650 mg at 05/15/20 0434 .  alum & mag hydroxide-simeth (MAALOX/MYLANTA) 200-200-20 MG/5ML suspension 30 mL, 30 mL, Oral, Q4H PRN, Hongalgi, Anand D, MD .  atorvastatin (LIPITOR) tablet 20 mg, 20 mg, Oral, QPM, Hongalgi, Anand D, MD, 20 mg at 05/17/20 1819 .  benzonatate (TESSALON) capsule 200 mg, 200 mg, Oral, TID, Hongalgi, Anand D, MD, 200 mg at 05/17/20 1518 .  bisacodyl (DULCOLAX) suppository 10 mg, 10 mg, Rectal, Daily PRN, Hongalgi, Anand D, MD .  chlorhexidine (PERIDEX) 0.12 % solution 15 mL, 15 mL, Mouth Rinse, BID, Kc, Ramesh, MD, 15 mL at 05/17/20 0918 .  clonazePAM (KLONOPIN) disintegrating tablet 0.25 mg, 0.25 mg, Oral, BID, Dana Allan I, MD, 0.25 mg at 05/18/20 0828 .  divalproex (DEPAKOTE) DR tablet 1,000 mg, 1,000 mg, Oral, QHS, Kris Mouton, RPH .  divalproex (DEPAKOTE) DR tablet 500 mg, 500 mg, Oral, Daily, Kris Mouton, RPH, 500 mg at 05/18/20 3662 .  docusate sodium (COLACE) capsule 100 mg, 100 mg, Oral, BID, Hongalgi, Anand D, MD, 100 mg at 05/18/20 0834 .  enoxaparin (LOVENOX) injection 40 mg, 40 mg, Subcutaneous, Q24H, Hongalgi, Anand D, MD, 40 mg at  05/18/20 0840 .  ertapenem (INVANZ) 1,000 mg in sodium chloride 0.9 % 100 mL IVPB, 1 g, Intravenous, Q24H, Vu, Trung T, MD, Last Rate: 200 mL/hr at 05/17/20 1519, 1,000 mg at 05/17/20 1519 .  feeding supplement (ENSURE ENLIVE / ENSURE PLUS) liquid 237 mL, 237 mL, Oral, TID BM, Hongalgi, Anand D, MD, 237 mL at 05/17/20 1520 .  ipratropium-albuterol (DUONEB) 0.5-2.5 (3) MG/3ML nebulizer solution 3 mL, 3 mL, Nebulization, Q4H PRN, Hongalgi, Anand D, MD .  lacosamide (VIMPAT) tablet 200 mg, 200 mg, Oral, BID, Kc, Ramesh, MD, 200 mg at 05/18/20 0829 .  lip balm (CARMEX) ointment, , Topical, PRN, Hongalgi, Anand D, MD .  LORazepam (ATIVAN) injection 2 mg, 2 mg, Intravenous, PRN, Hongalgi, Anand D, MD .  LORazepam (ATIVAN) tablet 0.5 mg, 0.5 mg, Oral, Q6H, Kerney Elbe, MD, 0.5 mg at 05/18/20 0830 .  MEDLINE mouth rinse, 15 mL, Mouth Rinse, q12n4p, Hongalgi, Anand D, MD, 15 mL at 05/17/20 1520 .  MEDLINE mouth rinse,  15 mL, Mouth Rinse, q12n4p, Kc, Ramesh, MD, 15 mL at 05/17/20 1521 .  melatonin tablet 3 mg, 3 mg, Oral, QHS PRN, Hongalgi, Anand D, MD .  metoCLOPramide (REGLAN) injection 5 mg, 5 mg, Intravenous, Q8H PRN, Kc, Ramesh, MD, 5 mg at 05/17/20 0918 .  metoprolol tartrate (LOPRESSOR) tablet 50 mg, 50 mg, Oral, BID, Kc, Ramesh, MD, 50 mg at 05/17/20 0918 .  mometasone-formoterol (DULERA) 200-5 MCG/ACT inhaler 2 puff, 2 puff, Inhalation, BID, Hongalgi, Lenis Dickinson, MD, 2 puff at 05/18/20 0731 .  multivitamin with minerals tablet 1 tablet, 1 tablet, Oral, Daily, Hongalgi, Lenis Dickinson, MD, 1 tablet at 05/17/20 [phone] .  ondansetron (ZOFRAN) injection 4 mg, 4 mg, Intravenous, Q6H PRN, Kc, Ramesh, MD, 4 mg at 05/16/20 1658 .  pantoprazole (PROTONIX) EC tablet 40 mg, 40 mg, Oral, QHS, Kris Mouton, RPH, 40 mg at 05/16/20 2151 .  phenol (CHLORASEPTIC) mouth spray 1 spray, 1 spray, Mouth/Throat, Q6H PRN, Hongalgi, Anand D, MD .  polyethylene glycol (MIRALAX / GLYCOLAX) packet 17 g, 17 g, Oral, Daily PRN,  Hongalgi, Anand D, MD .  QUEtiapine (SEROQUEL) tablet 25 mg, 25 mg, Oral, BID, Dana Allan I, MD, 25 mg at 05/18/20 0834 .  sodium chloride flush (NS) 0.9 % injection 3 mL, 3 mL, Intravenous, Q12H, Hongalgi, Anand D, MD, 3 mL at 05/18/20 0834  Patients Current Diet:  Diet Order            DIET DYS 3 Room service appropriate? No; Fluid consistency: Thin  Diet effective now                 Precautions / Restrictions Precautions Precautions: Fall Precaution Comments: Contact Precautions. Restrictions Weight Bearing Restrictions: No   Has the patient had 2 or more falls or a fall with injury in the past year? No  Prior Activity Level Limited Community (1-2x/wk): independent with mobility and adls  Prior Functional Level Self Care: Did the patient need help bathing, dressing, using the toilet or eating? Independent  Indoor Mobility: Did the patient need assistance with walking from room to room (with or without device)? Independent  Stairs: Did the patient need assistance with internal or external stairs (with or without device)? Independent  Functional Cognition: Did the patient need help planning regular tasks such as shopping or remembering to take medications? Needed some help  Home Assistive Devices / [place] Devices/Equipment: None Home Equipment: Walker - 2 wheels,Shower seat,Bedside commode,Wheelchair - manual  Prior Device Use: Indicate devices/aids used by the patient prior to current illness, exacerbation or injury? None of the above  Current Functional Level Cognition  Arousal/Alertness: Awake/alert Overall Cognitive Status: History of cognitive impairments - at baseline Current Attention Level: Selective Orientation Level: Oriented X4 Following Commands: Follows one step commands inconsistently,Follows one step commands with increased time Safety/Judgement: Decreased awareness of deficits,Decreased awareness of safety General Comments:  reported need to go to bathroom, but required step by step commands with minimal follow through noted, significant deficits in safety noted    Extremity Assessment (includes Sensation/Coordination)  Upper Extremity Assessment: Defer to OT evaluation  Lower Extremity Assessment: Generalized weakness    ADLs  Overall ADL's : Needs assistance/impaired Grooming: Wash/dry face,Min guard,Wash/dry hands,Sitting Upper Body Bathing: Minimal assistance,Sitting Upper Body Dressing : Minimal assistance,Moderate assistance,Sitting Toilet Transfer: Maximal assistance,Cueing for safety,Cueing for sequencing,Stand-pivot,BSC Toilet Transfer Details (indicate cue type and reason): LOB x2 to get to [facility], requiring max A in order to safely transfer Toileting- Clothing Manipulation and  Hygiene: Total assistance Toileting - Clothing Manipulation Details (indicate cue type and reason): due to lack of balance in standing LOB x1 Functional mobility during ADLs: Moderate assistance General ADL Comments: Pt max A for transfer to [facility], and mod A to return with RW, pt not following commands, short sitting and having LOB x3 when attempting to get to [facility] (sitting back on bed, sitting halfway on commode, and losing balance coming into standing from [facility] to complete peri-care)    Mobility  Overal bed mobility: Needs Assistance Bed Mobility: Sit to Supine Rolling: Min assist Sidelying to sit: Min assist Supine to sit: HOB elevated,Min assist Sit to supine: Min guard General bed mobility comments: Pt required assistance to move to edge of bed.    Transfers  Overall transfer level: Needs assistance Equipment used: Rolling walker (2 wheeled) Transfers: Squat Pivot Transfers Sit to Stand: Min assist Stand pivot transfers: Min assist Squat pivot transfers: Mod assist General transfer comment: Cues for hand placement to push from seated surface to rise into standing. alittle assist to pivot to 3N1.  Went to bathroom and  then needed cues to stand to RW for hand placement and needed steadying once up.    Ambulation / Gait / Stairs / Wheelchair Mobility  Ambulation/Gait Ambulation/Gait assistance: Mod assist,Min assist Gait Distance (Feet): 20 Feet Assistive device: Rolling walker (2 wheeled) Gait Pattern/deviations: Trunk flexed,Step-to pattern,Decreased step length - right,Decreased step length - left,Drifts right/left General Gait Details: Pt required cues for safety and assistance to manage RW position.  Pt with noted weakness but no overt LOB. Fatigues quickly. Gait velocity: decreased Gait velocity interpretation: <1.31 ft/sec, indicative of household ambulator    Posture / Balance Dynamic Sitting Balance Sitting balance - Comments: posterior lean Balance Overall balance assessment: Needs assistance Sitting-balance support: Feet supported,No upper extremity supported Sitting balance-Leahy Scale: Fair Sitting balance - Comments: posterior lean Postural control: Posterior lean Standing balance support: Bilateral upper extremity supported Standing balance-Leahy Scale: Poor Standing balance comment: Assist of 1 for balance with RW    Special needs/care consideration Seizure Precautions Intellectual delay at baseline Visitors approved are Mom accompanied by brother or sister and sister, [name]   Previous [place]: Parent  Lives With: Family Available Help at Discharge: Family,Available 24 hours/day Type of Home: [place]: Two level,Laundry or work area in basement,Able to live on main level with bedroom/bathroom Home Access: Stairs to enter Entrance Stairs-Rails: None Technical brewer of Steps: 1 Bathroom Shower/Tub: Chiropodist: Standard Bathroom Accessibility: Yes How Accessible: Accessible via walker Atlas: No Additional Comments: Above information obtained per chart review   Discharge Living Setting Plans for  Discharge Living Setting: Patient's home,Lives with (comment) Type of Home at Discharge: House Discharge Home Layout: Two level,Laundry or work area in basement Discharge Home Access: Stairs to enter Entrance Stairs-Rails: None Technical brewer of Steps: 1 Discharge Bathroom Shower/Tub: Tub/shower unit Discharge Bathroom Toilet: Standard Discharge Bathroom Accessibility: Yes How Accessible: Accessible via walker Does the patient have any problems obtaining your medications?: No  Social/Family/Support Systems Contact Information: [name], sister is main contact Anticipated Caregiver: mother and sister [name] Ability/Limitations of Caregiver: 24/7 care Caregiver Availability: 24/7 Discharge Plan Discussed with Primary Caregiver: Yes Is Caregiver In Agreement with Plan?: Yes Does Caregiver/Family have Issues with Lodging/Transportation while Pt is in Rehab?: No  Goals Patient/Family Goal for Rehab: Supervision with PT, OT, and SLP Expected length of stay: 10 to 14 days Additional Information: Intellectual delay Pt/Family  Agrees to Admission and willing to participate: Yes Program Orientation Provided & Reviewed with Pt/Caregiver Including Roles  & Responsibilities: Yes  Decrease burden of Care through IP rehab admission: n/a  Possible need for SNF placement upon discharge: not anticipated  Patient Condition: I have reviewed medical records from [facility] , spoken with CM, and patient and family member. I met with patient at the bedside for inpatient rehabilitation assessment.  Patient will benefit from ongoing PT, OT and SLP, can actively participate in 3 hours of therapy a day 5 days of the week, and can make measurable gains during the admission.  Patient will also benefit from the coordinated team approach during an Inpatient Acute Rehabilitation admission.  The patient will receive intensive therapy as well as Rehabilitation physician, nursing, social worker, and care  management interventions.  Due to bladder management, bowel management, safety, skin/wound care, disease management, medication administration, pain management and patient education the patient requires 24 hour a day rehabilitation nursing.  The patient is currently mod assist overall with mobility and basic ADLs.  Discharge setting and therapy post discharge at home with home health is anticipated.  Patient has agreed to participate in the Acute Inpatient Rehabilitation Program and will admit today.  Preadmission Screen Completed By:  Cleatrice Burke, 05/18/2020 10:36 AM ______________________________________________________________________   Discussed status with Dr. Naaman Plummer  on  05/18/2020 at  1055 and received approval for admission today.  Admission Coordinator:  Cleatrice Burke, RN, time  1055 Date  05/18/2020   Assessment/Plan: Diagnosis: debility related to sepsis and multiple medical 1. Does the need for close, 24 hr/day Medical supervision in concert with the patient's rehab needs make it unreasonable for this patient to be served in a less intensive setting? Yes 2. Co-Morbidities requiring supervision/potential complications: cognitive delay, seizure disorder, CHF, recent UTI 3. Due to bladder management, bowel management, safety, skin/wound care, disease management, medication administration, pain management and patient education, does the patient require 24 hr/day rehab nursing? Yes 4. Does the patient require coordinated care of a physician, rehab nurse, PT, OT, and SLP to address physical and functional deficits in the context of the above medical diagnosis(es)? Yes Addressing deficits in the following areas: balance, endurance, locomotion, strength, transferring, bowel/bladder control, bathing, dressing, feeding, grooming, toileting, cognition and psychosocial support 5. Can the patient actively participate in an intensive therapy program of at least 3 hrs of therapy 5  days a week? Yes 6. The potential for patient to make measurable gains while on inpatient rehab is excellent 7. Anticipated functional outcomes upon discharge from inpatient rehab: supervision PT, supervision OT, supervision SLP 8. Estimated rehab length of stay to reach the above functional goals is: 10-14 days 9. Anticipated discharge destination: Home 10. Overall Rehab/Functional Prognosis: excellent   MD Signature: Meredith Staggers, MD, [facility] 05/18/2020

## 2020-05-18 NOTE — H&P (Signed)
Physical Medicine and Rehabilitation Admission H&P     CC: Debility     HPI:  Maria [name] is a 59 year old female with history of intellectual delay, seizure disorder, chronic diastolic CHF, GERD, chronic constipation who was old originally admitted [facility] on 04/14/2020 with septic shock due to E. coli UTI with pyelonephritis.  CT of chest showed small abscess in lower pole of left kidney and follow-up ultrasound 11 days later showed stable lesion likely abscess versus renal cyst.  Urology did not feel lesion was abscess and she completed 14-day course of Rocephin.   Her hospital course was significant for breakthrough seizures with delirium and agitation.  Medical issues had resolved but patient was noted to be debilitated and CIR was recommended due to functional decline.  She was admitted to CIR on 05/05/2020 and was doing well until 12/08 when she developed fever due to UTI.  She was started on fluids as well as antibiotics for treatment of sepsis however onset 12/10 blood cultures revealed gram-negative rods and urine culture showed ESBL E. coli.  Antibiotics were changed to IV meropenem per ID input and patient was transferred to acute hospital for closer monitoring.   Repeat CT abdomen pelvis showed prior abscesses had resolved and revealed small new focal low-density midpole left kidney measuring 12 mm with question of small developing abscess.  Dr. Claudia Desanctis and IR felt that new lesion not amiable to percutaneous drainage and to continue antibiotics for now.  Patient responding to current regimen and sepsis has resolved.  She was transition to [place] on 12/12 with recommendations to check weekly ESR/CRP and repeat CT abdomen pelvis on 12/24 to reassess renal abscess and ultimately guide duration of therapy.  On early 12/14, patient noted to have focal seizure and neurology was consulted for input.  Dr. Cheral Marker recommended continuing current anticonvulsant regimen, treatment of  sepsis with seizure precautions--to treat seizures with taper of Ativan X 3 days.  Valproic acid level less than 10 and felt to be due to significant interaction between meropenem and divalproex--> dose increased 200 mg p.o. in the evening and 500 in a.m with pharmacy to assist in dosing/management of  valproic acid.  She has had issues with nausea requiring as needed Zofran and Reglan.  She is tolerating dysphagia 3 diet as well as activity.  Therapy resumed with patient showing ongoing deficits in mobility and ADLs.  She was cleared to resume her rehab course.     Review of Systems  Unable to perform ROS: Mental acuity  Constitutional: Negative for chills and fever.  Eyes: Negative for blurred vision.  Respiratory: Positive for cough. Negative for shortness of breath.   Cardiovascular: Negative for chest pain.  Gastrointestinal: Negative for heartburn and nausea.  Musculoskeletal: Negative for myalgias.            Past Medical History:  Diagnosis Date  . Constipation    . Dysphagia    . Fracture      R foot  . GERD (gastroesophageal reflux disease)    . History of shingles 10/2016  . Mental retardation      lesion in head  . Seizures ([place])      "not fully controlled on max doses of meds" (Neuro ofc note 12/2014)  . Thrombocytopenia ([place])    . Tremor        Past Surgical History:  Procedure Laterality Date  . BIOPSY   09/16/2014    Procedure: BIOPSY;  Surgeon: Rogene Houston, MD;  Location: [facility];  Service: Endoscopy;;  . CATARACT EXTRACTION        both eyes, May of 2015  . COLONOSCOPY      . COLONOSCOPY WITH PROPOFOL N/A 11/20/2018    Procedure: COLONOSCOPY WITH PROPOFOL;  Surgeon: Rogene Houston, MD;  Location: [facility];  Service: Endoscopy;  Laterality: N/A;  . ESOPHAGEAL DILATION N/A 09/16/2014    Procedure: ESOPHAGEAL DILATION WITH 54FR MALONEY DILATOR;  Surgeon: Rogene Houston, MD;  Location: [facility];  Service: Endoscopy;  Laterality: N/A;  .  ESOPHAGOGASTRODUODENOSCOPY (EGD) WITH PROPOFOL N/A 09/16/2014    Procedure: ESOPHAGOGASTRODUODENOSCOPY (EGD) WITH PROPOFOL;  Surgeon: Rogene Houston, MD;  Location: [facility];  Service: Endoscopy;  Laterality: N/A;  . MOUTH SURGERY      . POLYPECTOMY   11/20/2018    Procedure: POLYPECTOMY;  Surgeon: Rogene Houston, MD;  Location: [facility];  Service: Endoscopy;;  colon  . Skin graft to gum Right 08/2013  . TONSILLECTOMY AND ADENOIDECTOMY      . TOTAL ABDOMINAL HYSTERECTOMY               Family History  Problem Relation Age of Onset  . High Cholesterol Mother    . High blood pressure Mother    . Diabetes Father        Social History:  reports that she quit smoking about 14 years ago. Her smoking use included cigarettes. She has a 22.50 pack-year smoking history. She has never used smokeless tobacco. She reports that she does not drink alcohol and does not use drugs.          Allergies  Allergen Reactions  . Codeine Nausea And Vomiting  . Lamictal [Lamotrigine] Rash            Medications Prior to Admission  Medication Sig Dispense Refill  . acetaminophen (TYLENOL) 500 MG tablet Take 1 tablet (500 mg total) by mouth every 6 (six) hours as needed for moderate pain. 30 tablet 0  . atorvastatin (LIPITOR) 20 MG tablet Take 20 mg by mouth daily.      . Calcium 600-400 MG-UNIT CHEW Chew 1 tablet by mouth in the morning and at bedtime.      . clonazePAM (KLONOPIN) 1 MG tablet Take 1 tablet (1 mg total) by mouth 3 (three) times daily. (Patient taking differently: Take 0.5 mg by mouth 2 (two) times daily.) 30 tablet 0  . diazepam (VALIUM) 2 MG tablet Take 1 tablet (2 mg total) by mouth at bedtime. 30 tablet 0  . divalproex (DEPAKOTE ER) 500 MG 24 hr tablet Take 1 tablet (500 mg total) by mouth 2 (two) times daily. 180 tablet 3  . docusate sodium (COLACE) 100 MG capsule Take 1 capsule (100 mg total) by mouth 2 (two) times daily. 10 capsule 0  . feeding supplement (ENSURE ENLIVE / ENSURE  PLUS) LIQD Take 237 mLs by mouth 3 (three) times daily between meals. 237 mL 12  . lacosamide (VIMPAT) 200 MG TABS tablet Take 1 tablet (200 mg total) by mouth 2 (two) times daily. 180 tablet 1  . levocetirizine (XYZAL) 5 MG tablet Take 5 mg by mouth every evening.      . metoprolol tartrate (LOPRESSOR) 25 MG tablet Take 1 tablet (25 mg total) by mouth 2 (two) times daily.      Marland Kitchen omeprazole (PRILOSEC) 40 MG capsule Take 1 capsule (40 mg total) by mouth daily. 90 capsule 3  .  oxybutynin (DITROPAN-XL) 5 MG 24 hr tablet Take 5 mg by mouth at bedtime.       . Pediatric Multiple Vit-C-FA (PEDIATRIC MULTIVITAMIN) chewable tablet Chew 1 tablet by mouth daily.      . QUEtiapine (SEROQUEL) 25 MG tablet Take 3 tablets (75 mg total) by mouth daily.      . QUEtiapine (SEROQUEL) 50 MG tablet Take 3 tablets (150 mg total) by mouth at bedtime.      Marland Kitchen ipratropium-albuterol (DUONEB) 0.5-2.5 (3) MG/3ML SOLN Take 3 mLs by nebulization every 4 (four) hours as needed. (Patient taking differently: Take 3 mLs by nebulization every 4 (four) hours as needed (wheezing).) 360 mL        Drug Regimen Review  Drug regimen was reviewed and remains appropriate with no significant issues identified   Home: Home Living Family/patient expects to be discharged to:: Private residence Living Arrangements: Parent Available Help at Discharge: Family,Available 24 hours/day Type of Home: House Home Access: Stairs to enter [employer] of Steps: 1 Entrance Stairs-Rails: None Home Layout: Two level,Laundry or work area in basement,Able to live on main level with bedroom/bathroom Bathroom Shower/Tub: Chiropodist: Standard Bathroom Accessibility: Yes Home Equipment: Environmental consultant - 2 wheels,Shower seat,Bedside commode,Wheelchair - manual Additional Comments: Above information obtained per chart review   Lives With: Family   Functional History: Prior Function Level of Independence: Needs assistance Gait /  Transfers Assistance Needed: independent perpt ADL's / Homemaking Assistance Needed: per OT reports independence Communication / Swallowing Assistance Needed: slow processing for responses Comments: supervision for safety, cognition, medication; pt's mother reports pt would help with light household tasks such as washing dishes or vacuuming; pt's mother reports pt hasn't had a seizure in 3 years; pt/brother report pt is very sedentary at baseline sleeping a lot during the day and otherwise sitting and watching TV   Functional Status:  Mobility: Bed Mobility Overal bed mobility: Needs Assistance Bed Mobility: Sit to Supine Rolling: Min assist Sidelying to sit: Min assist Supine to sit: HOB elevated,Min assist Sit to supine: Min guard General bed mobility comments: Pt required assistance to move to edge of bed. Transfers Overall transfer level: Needs assistance Equipment used: Rolling walker (2 wheeled) Transfers: Squat Pivot Transfers Sit to Stand: Min assist Stand pivot transfers: Min assist Squat pivot transfers: Mod assist General transfer comment: Cues for hand placement to push from seated surface to rise into standing. alittle assist to pivot to 3N1.  Went to bathroom and then needed cues to stand to RW for hand placement and needed steadying once up. Ambulation/Gait Ambulation/Gait assistance: Mod assist,Min assist Gait Distance (Feet): 20 Feet Assistive device: Rolling walker (2 wheeled) Gait Pattern/deviations: Trunk flexed,Step-to pattern,Decreased step length - right,Decreased step length - left,Drifts right/left General Gait Details: Pt required cues for safety and assistance to manage RW position.  Pt with noted weakness but no overt LOB. Fatigues quickly. Gait velocity: decreased Gait velocity interpretation: <1.31 ft/sec, indicative of household ambulator   ADL: ADL Overall ADL's : Needs assistance/impaired Grooming: Wash/dry face,Min guard,Wash/dry  hands,Sitting Upper Body Bathing: Minimal assistance,Sitting Upper Body Dressing : Minimal assistance,Moderate assistance,Sitting Toilet Transfer: Maximal assistance,Cueing for safety,Cueing for sequencing,Stand-pivot,BSC Toilet Transfer Details (indicate cue type and reason): LOB x2 to get to [facility], requiring max A in order to safely transfer [place] and Hygiene: Total assistance Toileting - Clothing Manipulation Details (indicate cue type and reason): due to lack of balance in standing LOB x1 Functional mobility during ADLs: Moderate assistance General ADL Comments:  Pt max A for transfer to [facility], and mod A to return with RW, pt not following commands, short sitting and having LOB x3 when attempting to get to [facility] (sitting back on bed, sitting halfway on commode, and losing balance coming into standing from [facility] to complete peri-care)   Cognition: Cognition Overall Cognitive Status: History of cognitive impairments - at baseline Arousal/Alertness: Awake/alert Orientation Level: Oriented X4 Cognition Arousal/Alertness: Awake/alert Behavior During Therapy: Flat affect,Anxious,Impulsive Overall Cognitive Status: History of cognitive impairments - at baseline Area of Impairment: Problem solving,Awareness,Safety/judgement,Following commands,Memory,Attention Orientation Level: Situation Current Attention Level: Selective Memory: Decreased short-term memory,Decreased recall of precautions Following Commands: Follows one step commands inconsistently,Follows one step commands with increased time Safety/Judgement: Decreased awareness of deficits,Decreased awareness of safety Awareness: Intellectual Problem Solving: Slow processing,Requires verbal cues,Requires tactile cues,Decreased initiation,Difficulty sequencing General Comments: reported need to go to bathroom, but required step by step commands with minimal follow through noted, significant deficits in safety noted      Blood pressure 125/72, pulse 99, temperature 97.9 F (36.6 C), temperature source Oral, resp. rate 18, height 5' 6"  (1.676 m), weight 58 kg, SpO2 97 %. Physical Exam Vitals and nursing note reviewed.  Constitutional:      Appearance: She is ill-appearing.  HENT:     Head: Normocephalic.     Nose: Nose normal.     Mouth/Throat:     Mouth: Mucous membranes are moist.  Eyes:     Pupils: Pupils are equal, round, and reactive to light.  Cardiovascular:     Rate and Rhythm: Normal rate and regular rhythm.  Pulmonary:     Effort: Pulmonary effort is normal.     Breath sounds: Normal breath sounds.     Comments: Intermittent dry cough continues Abdominal:     General: Bowel sounds are normal. There is no distension.     Palpations: Abdomen is soft.     Tenderness: There is abdominal tenderness.  Skin:    General: Skin is warm.     Comments: Petechial lesions on torso/upper thighs --chronic due to drug reaction per mother.   Neurological:     Mental Status: She is alert.     Sensory: No sensory deficit.     Motor: Weakness present.     Comments: Oriented to self and place. Easily agitated but able to redirect. Able to follow simple motor commands with coaxing.    Psychiatric:     Comments: Flat. Does not make eye contact. Speech-loud and staccato. Impaired insight and awareness        Lab Results Last 48 Hours        Results for orders placed or performed during the hospital encounter of 05/12/20 (from the past 48 hour(s))  Comprehensive metabolic panel     Status: Abnormal    Collection Time: 05/17/20  1:28 AM  Result Value Ref Range    Sodium 136 135 - 145 mmol/L    Potassium 3.9 3.5 - 5.1 mmol/L    Chloride 102 98 - 111 mmol/L    CO2 23 22 - 32 mmol/L    Glucose, Bld 103 (H) 70 - 99 mg/dL      Comment: Glucose reference range applies only to samples taken after fasting for at least 8 hours.    BUN 11 6 - 20 mg/dL    Creatinine, Ser 0.76 0.44 - 1.00 mg/dL    Calcium 9.1  8.9 - 10.3 mg/dL    Total Protein 6.2 (L) 6.5 - 8.1 g/dL  Albumin 2.5 (L) 3.5 - 5.0 g/dL    AST 92 (H) 15 - 41 U/L    ALT 113 (H) 0 - 44 U/L    Alkaline Phosphatase 133 (H) 38 - 126 U/L    Total Bilirubin 0.5 0.3 - 1.2 mg/dL    GFR, Estimated >60 >60 mL/min      Comment: (NOTE) Calculated using the CKD-EPI Creatinine Equation (2021)      Anion gap 11 5 - 15      Comment: Performed at [facility] [address], [place], [place] [address]  CBC     Status: Abnormal    Collection Time: 05/17/20  1:28 AM  Result Value Ref Range    WBC 7.4 4.0 - 10.5 K/uL    RBC 3.09 (L) 3.87 - 5.11 MIL/uL    Hemoglobin 9.2 (L) 12.0 - 15.0 g/dL    HCT 28.0 (L) 36.0 - 46.0 %    MCV 90.6 80.0 - 100.0 fL    MCH 29.8 26.0 - 34.0 pg    MCHC 32.9 30.0 - 36.0 g/dL    RDW 15.9 (H) 11.5 - 15.5 %    Platelets 258 150 - 400 K/uL    nRBC 0.0 0.0 - 0.2 %      Comment: Performed at [facility], [place] [address] [place], [place] [address]  Comprehensive metabolic panel     Status: Abnormal    Collection Time: 05/18/20 12:18 AM  Result Value Ref Range    Sodium 139 135 - 145 mmol/L    Potassium 3.9 3.5 - 5.1 mmol/L    Chloride 103 98 - 111 mmol/L    CO2 25 22 - 32 mmol/L    Glucose, Bld 102 (H) 70 - 99 mg/dL      Comment: Glucose reference range applies only to samples taken after fasting for at least 8 hours.    BUN 13 6 - 20 mg/dL    Creatinine, Ser 0.75 0.44 - 1.00 mg/dL    Calcium 9.2 8.9 - 10.3 mg/dL    Total Protein 6.3 (L) 6.5 - 8.1 g/dL    Albumin 2.5 (L) 3.5 - 5.0 g/dL    AST 72 (H) 15 - 41 U/L    ALT 104 (H) 0 - 44 U/L    Alkaline Phosphatase 136 (H) 38 - 126 U/L    Total Bilirubin 0.8 0.3 - 1.2 mg/dL    GFR, Estimated >60 >60 mL/min      Comment: (NOTE) Calculated using the CKD-EPI Creatinine Equation (2021)      Anion gap 11 5 - 15      Comment: Performed at [facility], [place] [address] [place], [place] [address]  CBC     Status: Abnormal    Collection Time: 05/18/20  12:18 AM  Result Value Ref Range    WBC 9.1 4.0 - 10.5 K/uL    RBC 3.13 (L) 3.87 - 5.11 MIL/uL    Hemoglobin 9.3 (L) 12.0 - 15.0 g/dL    HCT 28.9 (L) 36.0 - 46.0 %    MCV 92.3 80.0 - 100.0 fL    MCH 29.7 26.0 - 34.0 pg    MCHC 32.2 30.0 - 36.0 g/dL    RDW 16.4 (H) 11.5 - 15.5 %    Platelets 274 150 - 400 K/uL    nRBC 0.0 0.0 - 0.2 %      Comment: Performed at [facility], [place] [address],  [place], [place] [address]  Valproic acid level     Status: Abnormal    Collection Time: 05/18/20 12:18 AM  Result Value Ref Range    Valproic Acid Lvl <10 (L) 50.0 - 100.0 ug/mL      Comment: RESULTS CONFIRMED BY MANUAL DILUTION Performed at [facility] [address], [place], [place] [address]        Imaging Results (Last 48 hours)  No results found.           Medical Problem List and Plan: 1.  Functional and mobility deficits secondary to debility after multiple medical issues, seizure disorder             -patient may shower             -ELOS/Goals: 10-14 days, supervision with PT, OT, SLP 2.  Antithrombotics: -DVT/anticoagulation:  Pharmaceutical: Lovenox             -antiplatelet therapy: N/A 3. Pain Management: Tylenol prn.  4. Mood: LCSW to follow for evaluation and support.             -antipsychotic agents: N/A 5. Neuropsych: This patient is not fully capable of making decisions on her own behalf. 6. Skin/Wound Care: Routine pressure relief measures.  7. Fluids/Electrolytes/Nutrition: Monitor I/O. Check lytes in am. 8. ESBL UTI with bacteremia: On Invanz -->repeat CT abdomen/pelvis 12/24 to help determine course of treatement 9. Question of renal abscess: Leucocytosis resolving on Invanz 10. H/o seizures: Breakthrough seizure due to drug interaction--will have pharmacy continue to dose Depakote while on Invanz. Continue Vimpat bid with Depakote 1000/500 mg for now. On ativan taper.  11. Anxiety/agitation: Continue home dose Klonopin 0.25 mg bid with Seroquel 25  mg bid for now. 12. Ongoing cough: CXR clear. Continue Tessalon perles with dulera bid.  13.  Chronic constipation: Continue colace bid--monitor as now on antibiotics.              Bary Leriche, PA-C 05/18/2020   I have personally performed a face to face diagnostic evaluation of this patient and formulated the key components of the plan.  Additionally, I have personally reviewed laboratory data, imaging studies, as well as relevant notes and concur with the physician assistant's documentation above.  The patient's status has not changed from the original H&P.  Any changes in documentation from the acute care chart have been noted above.  Meredith Staggers, MD, Mell[facility]rown

## 2020-05-18 NOTE — Progress Notes (Signed)
Pharmacy Consult  Pharmacy consulted for divalproex (Depakote) management. Patient on PTA at dose of 500 mg BID- which has been continued as inpatient. Has history of seizure disorder - on divalproex, vimpat, and clonazepam/valium PTA. Per outpatient note on 11/25/2019 the last seizure was about 4 years ago and she she has had been on divalproex, vimpat, and clonazepan since at least 11/2015.    She is on ertapenem for ESBL E Coli  Bactteria. Of note, significant interaction between meropenem and divalproex - concurrent use may result in decrease valproic acid concentrations. She was noted with a focal seizure on the evening on 12/14.  Valproic acid levels: 12/6: 51 12/12: < 10   12/16: < 10  -she only received valproic acid 1000mg  IV on 12/15. She refused all of her po medications last night  Plan -Continue Depakote 1000mg  po in the evening and 500mg  in the morning  -check a level in 2-4 days based on regimen compliance  Hildred Laser, PharmD Clinical Pharmacist **Pharmacist phone directory can now be found on [email or web address] ([email or web address]).  Listed under [place].

## 2020-05-18 NOTE — H&P (Signed)
Physical Medicine and Rehabilitation Admission H&P    CC: Debility   HPI:  Maria [name] is a 59 year old female with history of intellectual delay, seizure disorder, chronic diastolic CHF, GERD, chronic constipation who was old originally admitted via [facility] on 04/14/2020 with septic shock due to E. coli UTI with pyelonephritis.  CT of chest showed small abscess in lower pole of left kidney and follow-up ultrasound 11 days later showed stable lesion likely abscess versus renal cyst.  Urology did not feel lesion was abscess and she completed 14-day course of Rocephin.   Her hospital course was significant for breakthrough seizures with delirium and agitation.  Medical issues had resolved but patient was noted to be debilitated and CIR was recommended due to functional decline.  She was admitted to CIR on 05/05/2020 and was doing well until 12/08 when she developed fever due to UTI.  She was started on fluids as well as antibiotics for treatment of sepsis however onset 12/10 blood cultures revealed gram-negative rods and urine culture showed ESBL E. coli.  Antibiotics were changed to IV meropenem per ID input and patient was transferred to acute hospital for closer monitoring.  Repeat CT abdomen pelvis showed prior abscesses had resolved and revealed small new focal low-density midpole left kidney measuring 12 mm with question of small developing abscess.  Dr. Claudia Desanctis and IR felt that new lesion not amiable to percutaneous drainage and to continue antibiotics for now.  Patient responding to current regimen and sepsis has resolved.  She was transition to [place] on 12/12 with recommendations to check weekly ESR/CRP and repeat CT abdomen pelvis on 12/24 to reassess renal abscess and ultimately guide duration of therapy.  On early 12/14, patient noted to have focal seizure and neurology was consulted for input.  Dr. Cheral Marker recommended continuing current anticonvulsant regimen, treatment of sepsis  with seizure precautions--to treat seizures with taper of Ativan X 3 days.  Valproic acid level less than 10 and felt to be due to significant interaction between meropenem and divalproex--> dose increased 200 mg p.o. in the evening and 500 in a.m with pharmacy to assist in dosing/management of  valproic acid.  She has had issues with nausea requiring as needed Zofran and Reglan.  She is tolerating dysphagia 3 diet as well as activity.  Therapy resumed with patient showing ongoing deficits in mobility and ADLs.  She was cleared to resume her rehab course.   Review of Systems  Unable to perform ROS: Mental acuity  Constitutional: Negative for chills and fever.  Eyes: Negative for blurred vision.  Respiratory: Positive for cough. Negative for shortness of breath.   Cardiovascular: Negative for chest pain.  Gastrointestinal: Negative for heartburn and nausea.  Musculoskeletal: Negative for myalgias.      Past Medical History:  Diagnosis Date  . Constipation   . Dysphagia   . Fracture    R foot  . GERD (gastroesophageal reflux disease)   . History of shingles 10/2016  . Mental retardation    lesion in head  . Seizures ([place])    "not fully controlled on max doses of meds" (Neuro ofc note 12/2014)  . Thrombocytopenia ([place])   . Tremor     Past Surgical History:  Procedure Laterality Date  . BIOPSY  09/16/2014   Procedure: BIOPSY;  Surgeon: Rogene Houston, MD;  Location: [facility];  Service: Endoscopy;;  . CATARACT EXTRACTION     both eyes, May of 2015  . COLONOSCOPY    .  COLONOSCOPY WITH PROPOFOL N/A 11/20/2018   Procedure: COLONOSCOPY WITH PROPOFOL;  Surgeon: Rogene Houston, MD;  Location: [facility];  Service: Endoscopy;  Laterality: N/A;  . ESOPHAGEAL DILATION N/A 09/16/2014   Procedure: ESOPHAGEAL DILATION WITH 54FR MALONEY DILATOR;  Surgeon: Rogene Houston, MD;  Location: [facility];  Service: Endoscopy;  Laterality: N/A;  . ESOPHAGOGASTRODUODENOSCOPY (EGD) WITH PROPOFOL N/A  09/16/2014   Procedure: ESOPHAGOGASTRODUODENOSCOPY (EGD) WITH PROPOFOL;  Surgeon: Rogene Houston, MD;  Location: [facility];  Service: Endoscopy;  Laterality: N/A;  . MOUTH SURGERY    . POLYPECTOMY  11/20/2018   Procedure: POLYPECTOMY;  Surgeon: Rogene Houston, MD;  Location: [facility];  Service: Endoscopy;;  colon  . Skin graft to gum Right 08/2013  . TONSILLECTOMY AND ADENOIDECTOMY    . TOTAL ABDOMINAL HYSTERECTOMY      Family History  Problem Relation Age of Onset  . High Cholesterol Mother   . High blood pressure Mother   . Diabetes Father     Social History:  reports that she quit smoking about 14 years ago. Her smoking use included cigarettes. She has a 22.50 pack-year smoking history. She has never used smokeless tobacco. She reports that she does not drink alcohol and does not use drugs.    Allergies  Allergen Reactions  . Codeine Nausea And Vomiting  . Lamictal [Lamotrigine] Rash    Medications Prior to Admission  Medication Sig Dispense Refill  . acetaminophen (TYLENOL) 500 MG tablet Take 1 tablet (500 mg total) by mouth every 6 (six) hours as needed for moderate pain. 30 tablet 0  . atorvastatin (LIPITOR) 20 MG tablet Take 20 mg by mouth daily.    . Calcium 600-400 MG-UNIT CHEW Chew 1 tablet by mouth in the morning and at bedtime.    . clonazePAM (KLONOPIN) 1 MG tablet Take 1 tablet (1 mg total) by mouth 3 (three) times daily. (Patient taking differently: Take 0.5 mg by mouth 2 (two) times daily.) 30 tablet 0  . diazepam (VALIUM) 2 MG tablet Take 1 tablet (2 mg total) by mouth at bedtime. 30 tablet 0  . divalproex (DEPAKOTE ER) 500 MG 24 hr tablet Take 1 tablet (500 mg total) by mouth 2 (two) times daily. 180 tablet 3  . docusate sodium (COLACE) 100 MG capsule Take 1 capsule (100 mg total) by mouth 2 (two) times daily. 10 capsule 0  . feeding supplement (ENSURE ENLIVE / ENSURE PLUS) LIQD Take 237 mLs by mouth 3 (three) times daily between meals. 237 mL 12  . lacosamide  (VIMPAT) 200 MG TABS tablet Take 1 tablet (200 mg total) by mouth 2 (two) times daily. 180 tablet 1  . levocetirizine (XYZAL) 5 MG tablet Take 5 mg by mouth every evening.    . metoprolol tartrate (LOPRESSOR) 25 MG tablet Take 1 tablet (25 mg total) by mouth 2 (two) times daily.    Marland Kitchen omeprazole (PRILOSEC) 40 MG capsule Take 1 capsule (40 mg total) by mouth daily. 90 capsule 3  . oxybutynin (DITROPAN-XL) 5 MG 24 hr tablet Take 5 mg by mouth at bedtime.     . Pediatric Multiple Vit-C-FA (PEDIATRIC MULTIVITAMIN) chewable tablet Chew 1 tablet by mouth daily.    . QUEtiapine (SEROQUEL) 25 MG tablet Take 3 tablets (75 mg total) by mouth daily.    . QUEtiapine (SEROQUEL) 50 MG tablet Take 3 tablets (150 mg total) by mouth at bedtime.    Marland Kitchen ipratropium-albuterol (DUONEB) 0.5-2.5 (3) MG/3ML SOLN Take 3  mLs by nebulization every 4 (four) hours as needed. (Patient taking differently: Take 3 mLs by nebulization every 4 (four) hours as needed (wheezing).) 360 mL     Drug Regimen Review  Drug regimen was reviewed and remains appropriate with no significant issues identified  Home: Home Living Family/patient expects to be discharged to:: Private residence Living Arrangements: Parent Available Help at Discharge: Family,Available 24 hours/day Type of Home: House Home Access: Stairs to enter [employer] of Steps: 1 Entrance Stairs-Rails: None Home Layout: Two level,Laundry or work area in basement,Able to live on main level with bedroom/bathroom Bathroom Shower/Tub: Chiropodist: Standard Bathroom Accessibility: Yes Home Equipment: Environmental consultant - 2 wheels,Shower seat,Bedside commode,Wheelchair - manual Additional Comments: Above information obtained per chart review   Lives With: Family   Functional History: Prior Function Level of Independence: Needs assistance Gait / Transfers Assistance Needed: independent perpt ADL's / Homemaking Assistance Needed: per OT reports  independence Communication / Swallowing Assistance Needed: slow processing for responses Comments: supervision for safety, cognition, medication; pt's mother reports pt would help with light household tasks such as washing dishes or vacuuming; pt's mother reports pt hasn't had a seizure in 3 years; pt/brother report pt is very sedentary at baseline sleeping a lot during the day and otherwise sitting and watching TV  Functional Status:  Mobility: Bed Mobility Overal bed mobility: Needs Assistance Bed Mobility: Sit to Supine Rolling: Min assist Sidelying to sit: Min assist Supine to sit: HOB elevated,Min assist Sit to supine: Min guard General bed mobility comments: Pt required assistance to move to edge of bed. Transfers Overall transfer level: Needs assistance Equipment used: Rolling walker (2 wheeled) Transfers: Squat Pivot Transfers Sit to Stand: Min assist Stand pivot transfers: Min assist Squat pivot transfers: Mod assist General transfer comment: Cues for hand placement to push from seated surface to rise into standing. alittle assist to pivot to 3N1.  Went to bathroom and then needed cues to stand to RW for hand placement and needed steadying once up. Ambulation/Gait Ambulation/Gait assistance: Mod assist,Min assist Gait Distance (Feet): 20 Feet Assistive device: Rolling walker (2 wheeled) Gait Pattern/deviations: Trunk flexed,Step-to pattern,Decreased step length - right,Decreased step length - left,Drifts right/left General Gait Details: Pt required cues for safety and assistance to manage RW position.  Pt with noted weakness but no overt LOB. Fatigues quickly. Gait velocity: decreased Gait velocity interpretation: <1.31 ft/sec, indicative of household ambulator    ADL: ADL Overall ADL's : Needs assistance/impaired Grooming: Wash/dry face,Min guard,Wash/dry hands,Sitting Upper Body Bathing: Minimal assistance,Sitting Upper Body Dressing : Minimal assistance,Moderate  assistance,Sitting Toilet Transfer: Maximal assistance,Cueing for safety,Cueing for sequencing,Stand-pivot,BSC Toilet Transfer Details (indicate cue type and reason): LOB x2 to get to [facility], requiring max A in order to safely transfer [place] and Hygiene: Total assistance Toileting - Clothing Manipulation Details (indicate cue type and reason): due to lack of balance in standing LOB x1 Functional mobility during ADLs: Moderate assistance General ADL Comments: Pt max A for transfer to [facility], and mod A to return with RW, pt not following commands, short sitting and having LOB x3 when attempting to get to [facility] (sitting back on bed, sitting halfway on commode, and losing balance coming into standing from [facility] to complete peri-care)  Cognition: Cognition Overall Cognitive Status: History of cognitive impairments - at baseline Arousal/Alertness: Awake/alert Orientation Level: Oriented X4 Cognition Arousal/Alertness: Awake/alert Behavior During Therapy: Flat affect,Anxious,Impulsive Overall Cognitive Status: History of cognitive impairments - at baseline Area of Impairment: Problem solving,Awareness,Safety/judgement,Following commands,Memory,Attention Orientation  Level: Situation Current Attention Level: Selective Memory: Decreased short-term memory,Decreased recall of precautions Following Commands: Follows one step commands inconsistently,Follows one step commands with increased time Safety/Judgement: Decreased awareness of deficits,Decreased awareness of safety Awareness: Intellectual Problem Solving: Slow processing,Requires verbal cues,Requires tactile cues,Decreased initiation,Difficulty sequencing General Comments: reported need to go to bathroom, but required step by step commands with minimal follow through noted, significant deficits in safety noted   Blood pressure 125/72, pulse 99, temperature 97.9 F (36.6 C), temperature source Oral, resp. rate 18, height 5' 6"   (1.676 m), weight 58 kg, SpO2 97 %. Physical Exam Vitals and nursing note reviewed.  Constitutional:      Appearance: She is ill-appearing.  HENT:     Head: Normocephalic.     Nose: Nose normal.     Mouth/Throat:     Mouth: Mucous membranes are moist.  Eyes:     Pupils: Pupils are equal, round, and reactive to light.  Cardiovascular:     Rate and Rhythm: Normal rate and regular rhythm.  Pulmonary:     Effort: Pulmonary effort is normal.     Breath sounds: Normal breath sounds.     Comments: Intermittent dry cough continues Abdominal:     General: Bowel sounds are normal. There is no distension.     Palpations: Abdomen is soft.     Tenderness: There is abdominal tenderness.  Skin:    General: Skin is warm.     Comments: Petechial lesions on torso/upper thighs --chronic due to drug reaction per mother.   Neurological:     Mental Status: She is alert.     Sensory: No sensory deficit.     Motor: Weakness present.     Comments: Oriented to self and place. Easily agitated but able to redirect. Able to follow simple motor commands with coaxing.    Psychiatric:     Comments: Flat. Does not make eye contact. Speech-loud and staccato. Impaired insight and awareness     Results for orders placed or performed during the hospital encounter of 05/12/20 (from the past 48 hour(s))  Comprehensive metabolic panel     Status: Abnormal   Collection Time: 05/17/20  1:28 AM  Result Value Ref Range   Sodium 136 135 - 145 mmol/L   Potassium 3.9 3.5 - 5.1 mmol/L   Chloride 102 98 - 111 mmol/L   CO2 23 22 - 32 mmol/L   Glucose, Bld 103 (H) 70 - 99 mg/dL    Comment: Glucose reference range applies only to samples taken after fasting for at least 8 hours.   BUN 11 6 - 20 mg/dL   Creatinine, Ser 0.76 0.44 - 1.00 mg/dL   Calcium 9.1 8.9 - 10.3 mg/dL   Total Protein 6.2 (L) 6.5 - 8.1 g/dL   Albumin 2.5 (L) 3.5 - 5.0 g/dL   AST 92 (H) 15 - 41 U/L   ALT 113 (H) 0 - 44 U/L   Alkaline Phosphatase  133 (H) 38 - 126 U/L   Total Bilirubin 0.5 0.3 - 1.2 mg/dL   GFR, Estimated >60 >60 mL/min    Comment: (NOTE) Calculated using the CKD-EPI Creatinine Equation (2021)    Anion gap 11 5 - 15    Comment: Performed at [facility], [place] [address], [place], [place] [address]  CBC     Status: Abnormal   Collection Time: 05/17/20  1:28 AM  Result Value Ref Range   WBC 7.4 4.0 - 10.5 K/uL   RBC 3.09 (  L) 3.87 - 5.11 MIL/uL   Hemoglobin 9.2 (L) 12.0 - 15.0 g/dL   HCT 28.0 (L) 36.0 - 46.0 %   MCV 90.6 80.0 - 100.0 fL   MCH 29.8 26.0 - 34.0 pg   MCHC 32.9 30.0 - 36.0 g/dL   RDW 15.9 (H) 11.5 - 15.5 %   Platelets 258 150 - 400 K/uL   nRBC 0.0 0.0 - 0.2 %    Comment: Performed at [facility] [address], [place], [place] [address]  Comprehensive metabolic panel     Status: Abnormal   Collection Time: 05/18/20 12:18 AM  Result Value Ref Range   Sodium 139 135 - 145 mmol/L   Potassium 3.9 3.5 - 5.1 mmol/L   Chloride 103 98 - 111 mmol/L   CO2 25 22 - 32 mmol/L   Glucose, Bld 102 (H) 70 - 99 mg/dL    Comment: Glucose reference range applies only to samples taken after fasting for at least 8 hours.   BUN 13 6 - 20 mg/dL   Creatinine, Ser 0.75 0.44 - 1.00 mg/dL   Calcium 9.2 8.9 - 10.3 mg/dL   Total Protein 6.3 (L) 6.5 - 8.1 g/dL   Albumin 2.5 (L) 3.5 - 5.0 g/dL   AST 72 (H) 15 - 41 U/L   ALT 104 (H) 0 - 44 U/L   Alkaline Phosphatase 136 (H) 38 - 126 U/L   Total Bilirubin 0.8 0.3 - 1.2 mg/dL   GFR, Estimated >60 >60 mL/min    Comment: (NOTE) Calculated using the CKD-EPI Creatinine Equation (2021)    Anion gap 11 5 - 15    Comment: Performed at [facility], [place] [address], [place], [place] [address]  CBC     Status: Abnormal   Collection Time: 05/18/20 12:18 AM  Result Value Ref Range   WBC 9.1 4.0 - 10.5 K/uL   RBC 3.13 (L) 3.87 - 5.11 MIL/uL   Hemoglobin 9.3 (L) 12.0 - 15.0 g/dL   HCT 28.9 (L) 36.0 - 46.0 %   MCV 92.3 80.0 - 100.0 fL   MCH 29.7 26.0 - 34.0  pg   MCHC 32.2 30.0 - 36.0 g/dL   RDW 16.4 (H) 11.5 - 15.5 %   Platelets 274 150 - 400 K/uL   nRBC 0.0 0.0 - 0.2 %    Comment: Performed at [facility], [place] [address] [place], [place] [address]  Valproic acid level     Status: Abnormal   Collection Time: 05/18/20 12:18 AM  Result Value Ref Range   Valproic Acid Lvl <10 (L) 50.0 - 100.0 ug/mL    Comment: RESULTS CONFIRMED BY MANUAL DILUTION Performed at [facility] [address] [place], [place] [address]    No results found.     Medical Problem List and Plan: 1.  Functional and mobility deficits secondary to debility after multiple medical issues, seizure disorder  -patient may shower  -ELOS/Goals: 10-14 days, supervision with PT, OT, SLP 2.  Antithrombotics: -DVT/anticoagulation:  Pharmaceutical: Lovenox  -antiplatelet therapy: N/A 3. Pain Management: Tylenol prn.  4. Mood: LCSW to follow for evaluation and support.  -antipsychotic agents: N/A 5. Neuropsych: This patient is not fully capable of making decisions on her own behalf. 6. Skin/Wound Care: Routine pressure relief measures.  7. Fluids/Electrolytes/Nutrition: Monitor I/O. Check lytes in am. 8. ESBL UTI with bacteremia: On Invanz -->repeat CT abdomen/pelvis 12/24 to help determine course of treatement 9. Question of renal  abscess: Leucocytosis resolving on Invanz 10. H/o seizures: Breakthrough seizure due to drug interaction--will have pharmacy continue to dose Depakote while on Invanz. Continue Vimpat bid with Depakote 1000/500 mg for now. On ativan taper.  11. Anxiety/agitation: Continue home dose Klonopin 0.25 mg bid with Seroquel 25 mg bid for now. 12. Ongoing cough: CXR clear. Continue Tessalon perles with dulera bid.  13.  Chronic constipation: Continue colace bid--monitor as now on antibiotics.         Bary Leriche, PA-C 05/18/2020

## 2020-05-18 NOTE — Progress Notes (Signed)
Inpatient Rehabilitation Admissions Coordinator  I have CIR bed to readmit patient to today.I contacted Dr. Mel Almond and received clearance to d/c to Cir today. I have contacted patient's sister, [name] and she is in agreement to admit. I will make the arrangements to admit to CIR today.  Danne Baxter, RN, MSN Rehab Admissions Coordinator [phone] 05/18/2020 10:15 AM

## 2020-05-18 NOTE — Progress Notes (Signed)
Transferred from [address] to [address].  Transferred via bed.  Available belongs also transferred.  Mother present before transfer and stated she has all her other stuff in the car.

## 2020-05-18 NOTE — Discharge Summary (Addendum)
Physician Discharge Summary  Maria [name] GPQ:[id number] DOB: [birth year] DOA: 05/12/2020  PCP: Maria Squibb, MD  Admit date: 05/12/2020 Discharge date: 05/18/2020  Admitted From: CIR  Disposition:  CIR  Recommendations for Outpatient Follow-up:  1. Follow up with PCP after discharge from acute rehab. 2. Will need repeat CT of the abdomen and pelvis with IV contrast to assess renal abscess on 05/26/20 3. ID follow-up after obtaining repeat CT of the abdomen and pelvis.  Discharge Condition: Stable CODE STATUS: Full code Diet recommendation: Dysphagia 3 ,2 g sodium with thin liquids  Brief/Interim Summary: 59 year old female with significant history of intellectual disability/seizure disorder, chronic diastolic CHF, GERD, chronic constipation was seen by [facility] in consultation on 05/11/20 for new onset of fever and abnormal UA, further work-up revealed severe sepsis due to ESBL E. coli bacteremia and UTI, patient was transferred from [facility] progressive care return 12/10 for further management, was seen by PCCM, ID. Originally admitted at [facility] 04/14/2020 with septic shock requiring vasopressors presumed to be urinary source and transferred to [facility] ICU, CIN suspicious for pyelonephritis and small abscess in the left lower pole kidney, ultrasound 11 days later showed stable lesion felt to be representing a small perinephric abscess versus renal cyst, blood cultures drawn and negative but urine culture grew coli, not ESBL. She completed a 14 days course of ceftriaxone. Hospital course complicated by breakthrough seizures delirium agitation requiring Precedex, urology did not feel the abscess required any further intervention. She was transferred subsequently to CIR on 05/05/2020 and was doing well until 05/11/2019 when she developed fever and abnormal UA suggestive of UTI and urine culture grew E. coli, and [facility] was consulted 12/9. Patient slowly improved with IV meropenem, being followed by ID and  IR and urology.   Lethargy/Acute metabolic encephalopathylikely in the setting of sepsis: Improving.  Transaminitis AST ALT elevated  likely sepsis induced   GERD w/ Nausea-continues have intermittent nausea but improving. On dysphagia 3 diet continue as tolerated.  Hypokalemiaresolved  Intellectual disability: Chronic.Continue supportive care  Seizure disorder:History of breakthrough seizures during prior hospitalization, continue current AED with Vimpat, valproate Klonopin.    Her Klonopin dose was decreased over the weekend likely a contributor for breakthrough seizure during this hospitalization.  She was kept on tapering dose of lorazepam by neurology.  AKI -resolved Lactic acidosisresolved  Chronic diastolic CHF with EF 60 to 65% on echo 04/17/2020. Chest x-ray 12/10 stable.   GERDcontinuePPI  Anemia likely from chronic disease.  Discharge Diagnoses:  Principal Problem:   Severe sepsis ([place]) Active Problems:   GERD (gastroesophageal reflux disease)   Intellectual disability   Seizure disorder (HCC)   Renal abscess, left   AKI (acute kidney injury) ([place])   ESBL (extended spectrum beta-lactamase) producing bacteria infection   Bacteremia due to Escherichia coli    Discharge Instructions  Discharge Instructions    Diet - low sodium heart healthy   Complete by: As directed    Dysphagia 3 with thin liquids.   Discharge wound care:   Complete by: As directed    Continue with preadmission dressing changes   Increase activity slowly   Complete by: As directed      Allergies as of 05/18/2020      Reactions   Codeine Nausea And Vomiting   Lamictal [lamotrigine] Rash      Medication List    STOP taking these medications   divalproex 500 MG 24 hr tablet Commonly known as: DEPAKOTE ER Replaced by: divalproex 500  MG DR tablet     TAKE these medications   acetaminophen 500 MG tablet Commonly known as: TYLENOL Take 1 tablet (500 mg  total) by mouth every 6 (six) hours as needed for moderate pain.   atorvastatin 20 MG tablet Commonly known as: LIPITOR Take 20 mg by mouth daily.   Calcium 600-400 MG-UNIT Chew Chew 1 tablet by mouth in the morning and at bedtime.   clonazePAM 1 MG tablet Commonly known as: KLONOPIN Take 1 tablet (1 mg total) by mouth 3 (three) times daily. What changed:   how much to take  when to take this   diazepam 2 MG tablet Commonly known as: VALIUM Take 1 tablet (2 mg total) by mouth at bedtime.   divalproex 500 MG DR tablet Commonly known as: DEPAKOTE Take 2 tablets (1,000 mg total) by mouth at bedtime. Replaces: divalproex 500 MG 24 hr tablet   divalproex 500 MG DR tablet Commonly known as: DEPAKOTE Take 1 tablet (500 mg total) by mouth daily. Start taking on: May 19, 2020   docusate sodium 100 MG capsule Commonly known as: COLACE Take 1 capsule (100 mg total) by mouth 2 (two) times daily.   ertapenem 1,000 mg in sodium chloride 0.9 % 100 mL Inject 1,000 mg into the vein daily.   feeding supplement Liqd Take 237 mLs by mouth 3 (three) times daily between meals.   ipratropium-albuterol 0.5-2.5 (3) MG/3ML Soln Commonly known as: DUONEB Take 3 mLs by nebulization every 4 (four) hours as needed. What changed: reasons to take this   lacosamide 200 MG Tabs tablet Commonly known as: VIMPAT Take 1 tablet (200 mg total) by mouth 2 (two) times daily.   levocetirizine 5 MG tablet Commonly known as: XYZAL Take 5 mg by mouth every evening.   metoprolol tartrate 50 MG tablet Commonly known as: LOPRESSOR Take 1 tablet (50 mg total) by mouth 2 (two) times daily. What changed:   medication strength  how much to take   omeprazole 40 MG capsule Commonly known as: PRILOSEC Take 1 capsule (40 mg total) by mouth daily.   oxybutynin 5 MG 24 hr tablet Commonly known as: DITROPAN-XL Take 5 mg by mouth at bedtime.   pediatric multivitamin chewable tablet Chew 1 tablet by  mouth daily.   QUEtiapine 25 MG tablet Commonly known as: SEROQUEL Take 1 tablet (25 mg total) by mouth 2 (two) times daily. What changed:   medication strength  how much to take  when to take this  Another medication with the same name was removed. Continue taking this medication, and follow the directions you see here.            Discharge Care Instructions  (From admission, onward)         Start     Ordered   05/18/20 0000  Discharge wound care:       Comments: Continue with preadmission dressing changes   05/18/20 1154          Allergies  Allergen Reactions   Codeine Nausea And Vomiting   Lamictal [Lamotrigine] Rash    Consultations:  Neurolog,ID.   Procedures/Studies: EEG  Result Date: 04/20/2020 Lora Havens, MD     04/21/2020  8:41 AM Patient Name: NAZIYA HEGWOOD MRN: [id number] Epilepsy Attending: Lora Havens Referring Physician/Provider: Dr Ina Homes Date: 04/20/2020 Duration: 23.40 mins Patient history: 59yo F with h/o epilepsy now with ams. EEG to evaluate for seizure Level of alertness: Lethargic AEDs during EEG study:  LCM, VPA, Clonopin Technical aspects: This EEG study was done with scalp electrodes positioned according to the 10-20 International system of electrode placement. Electrical activity was acquired at a sampling rate of 500Hz  and reviewed with a high frequency filter of 70Hz  and a low frequency filter of 1Hz . EEG data were recorded continuously and digitally stored. Description: EEG showed continuous generalized 3 to 6 Hz theta-delta slowing. Patient was noted to have two episodes at 1042 and 1045 of sudden arm and leg elevation followed by jerking briefly for 2-3 seconds. Concomitant eeg showed generalized polyspikes lasting 10-12 seconds. Generalized spike and wave discharges were also noted, at times lasting 5-7seconds consistent with brief-ictal-interictal rhythmic discharges ( BIRDS). Hyperventilation and photic stimulation  were not performed.   ABNORMALITY - Seizure, generalized - BIRDs - Spike and wave, generalized - Continuous slow, generalized IMPRESSION: This study showed two seizures at 1042 and 1045 during which patient was noted to have sudden arm and leg elevation followed by jerking for 2 to 3 seconds with generalized onset.  Additionally there is evidence of severe diffuse encephalopathy, likely secondary to seizures. Dr. Ina Homes was immediately notified. Lora Havens   DG Chest 2 View  Result Date: 05/08/2020 CLINICAL DATA:  Reason for exam: cough, persistent Patient showed difficulty verifying name and dob. Denies any sob or chest pains or cough. Patient had dry cough during exam. EXAM: CHEST - 2 VIEW COMPARISON:  05/01/2020 FINDINGS: Lungs are clear.  Interval removal of feeding tube and PICC line. Heart size and mediastinal contours are within normal limits. No effusion.  No pneumothorax. Visualized bones unremarkable. IMPRESSION: No acute cardiopulmonary disease. Electronically Signed   By: Lucrezia Europe M.D.   On: 05/08/2020 10:49   DG Chest 2 View  Result Date: 05/02/2020 CLINICAL DATA:  Cough EXAM: CHEST - 2 VIEW COMPARISON:  Film from the previous day. FINDINGS: Cardiac shadow is within normal limits. Feeding catheter is noted extending into the stomach. Right-sided PICC line is noted in satisfactory position. The lungs are well aerated without focal infiltrate or effusion. No bony abnormality is seen. IMPRESSION: No acute abnormality noted. Electronically Signed   By: Inez Catalina M.D.   On: 05/02/2020 00:15   DG Abd 1 View  Result Date: 05/13/2020 CLINICAL DATA:  Abdominal distension. EXAM: ABDOMEN - 1 VIEW COMPARISON:  May 10, 2020. FINDINGS: The bowel gas pattern is normal. No radio-opaque calculi or other significant radiographic abnormality are seen. IMPRESSION: Negative. Electronically Signed   By: Marijo Conception M.D.   On: 05/13/2020 15:55   DG Abd 1 View  Result Date:  05/10/2020 CLINICAL DATA:  Bilateral lower quadrant abdominal pain EXAM: ABDOMEN - 1 VIEW COMPARISON:  05/01/2020 FINDINGS: Lung bases are clear. Esophageal tube has been removed. Nonobstructed gas pattern. Probable phleboliths in the right pelvis. No radiopaque calculi over the kidneys. IMPRESSION: Nonobstructed gas pattern. Electronically Signed   By: Donavan Foil M.D.   On: 05/10/2020 22:14   DG Abd 1 View  Result Date: 05/01/2020 CLINICAL DATA:  Abdominal pain. EXAM: ABDOMEN - 1 VIEW COMPARISON:  April 21, 2020. FINDINGS: The bowel gas pattern is normal. Distal tip of feeding tube is seen in expected position of distal stomach. No radio-opaque calculi or other significant radiographic abnormality are seen. IMPRESSION: No evidence of bowel obstruction or ileus. Electronically Signed   By: Marijo Conception M.D.   On: 05/01/2020 10:53   DG Abd 1 View  Result Date: 04/21/2020 CLINICAL DATA:  59 year old female  with hypoxia. Feeding tube placement. EXAM: ABDOMEN - 1 VIEW COMPARISON:  CT Abdomen and Pelvis 04/14/2020. FINDINGS: Portable AP supine view at 1012 hours. Enteric feeding tube tip is at the level of the distal gastric body. Bowel-gas pattern remains within normal limits. No acute osseous abnormality identified. IMPRESSION: Enteric feeding tube tip at the level of the distal gastric body. If post pyloric placement is desired recommend advancement of 6-8 cm to allow post pyloric transit. Electronically Signed   By: Genevie Ann M.D.   On: 04/21/2020 11:09   CT ABDOMEN PELVIS W CONTRAST  Result Date: 05/12/2020 CLINICAL DATA:  Follow-up possible renal abscess EXAM: CT ABDOMEN AND PELVIS WITH CONTRAST TECHNIQUE: Multidetector CT imaging of the abdomen and pelvis was performed using the standard protocol following bolus administration of intravenous contrast. CONTRAST:  30mL OMNIPAQUE IOHEXOL 300 MG/ML  SOLN COMPARISON:  04/14/2020 FINDINGS: Lower chest: Bibasilar dependent atelectasis. Heart is  borderline in size. Hepatobiliary: No focal hepatic abnormality. Gallbladder unremarkable. Pancreas: No focal abnormality or ductal dilatation. Spleen: No focal abnormality.  Normal size. Adrenals/Urinary Tract: Areas of decreased renal perfusion within the upper and mid poles of the left kidney, new since prior study concerning for pyelonephritis. New focal small low-density lesion in the midpole of the left kidney measures 12 mm and could reflect a small developing abscess. The previously seen fluid collection in the lower pole of the left kidney has resolved. There is perinephric stranding most notable around the lower pole. No hydronephrosis. No focal abnormality on the right. Adrenal glands and urinary bladder unremarkable. Stomach/Bowel: Stomach, large and small bowel grossly unremarkable. Normal appendix Vascular/Lymphatic: Aortoiliac atherosclerosis. No evidence of aneurysm or adenopathy. Reproductive: Prior hysterectomy.  No adnexal masses. Other: No free fluid or free air. Musculoskeletal: No acute bony abnormality. IMPRESSION: Previously seen fluid collection in the lower pole of the left kidney has resolved. There continues to be stranding around the lower pole of the left kidney. In addition, there are new areas of decreased perfusion and striated nephrograms in the mid and upper pole of the left kidney concerning for pyelonephritis. New small low-density fluid collection in the midpole measures 12 mm and is concerning for new small developing abscess. Aortoiliac atherosclerosis. Dependent bibasilar atelectasis. Electronically Signed   By: Rolm Baptise M.D.   On: 05/12/2020 22:30   US RENAL  Result Date: 05/11/2020 CLINICAL DATA:  Acute kidney injury EXAM: RENAL / URINARY TRACT ULTRASOUND COMPLETE COMPARISON:  Ultrasound renal dated April 25, 2020 FINDINGS: Right Kidney: Renal measurements: 11 x 5.1 x 5.2 cm = volume: 153 mL. There is no hydronephrosis. The cortical echogenicity is slightly  increased. Left Kidney: Renal measurements: 9.7 x 5.2 x 3.8 cm = volume: 101 mL. The cortical echogenicity is slightly increased. There is mild left-sided collecting system dilatation. The previously demonstrated hypoechoic lesion involving the left kidney is no longer well visualized. Bladder: Appears normal for degree of bladder distention. Other: None. IMPRESSION: 1. Echogenic kidneys bilaterally which can be seen in patients with medical renal disease. 2. Atrophic left kidney, similar to prior study. 3. Mild left-sided collecting system dilatation. 4. Previously demonstrated hypoechoic area arising from the lower pole the left kidney is not well appreciated on today's study. Electronically Signed   By: Constance Holster M.D.   On: 05/11/2020 22:27   US RENAL  Result Date: 04/25/2020 CLINICAL DATA:  Perinephric abscess. EXAM: RENAL / URINARY TRACT ULTRASOUND COMPLETE COMPARISON:  April 14, 2020 FINDINGS: Right Kidney: Renal measurements: 11.1 cm x 6.1  cm x 5.6 cm = volume: 197.1 mL. Echogenicity within normal limits. No mass or hydronephrosis visualized. Left Kidney: Renal measurements: 10.3 cm x 5.3 cm x 4.3 cm = volume: 123.5 mL. Echogenicity within normal limits. A stable 2.5 cm x 2.4 cm x 2.3 cm hypoechoic lesion is seen within the lower pole of the left kidney. While this is seen on the prior ultrasound and corresponds to an area of abnormality noted on the prior abdomen and pelvis CT (April 14, 2020), it should be noted that a small cyst is seen within the lower pole of the left kidney on the abdomen and pelvis CT dated May 26, 2015 and December 16, 2012. No abnormal flow is seen within this region on color Doppler evaluation. No hydronephrosis is visualized. Bladder: Appears normal for degree of bladder distention. Other: None. IMPRESSION: Stable hypoechoic lesion within the lower pole of the left kidney. While this may represent a small perinephric abscess, as suggested on the prior study,  and infected renal cyst cannot be excluded. Electronically Signed   By: Virgina Norfolk M.D.   On: 04/25/2020 17:37   DG CHEST PORT 1 VIEW  Result Date: 05/12/2020 CLINICAL DATA:  Shortness of breath, cough. EXAM: PORTABLE CHEST 1 VIEW COMPARISON:  December 9, 21. FINDINGS: The heart size and mediastinal contours are within normal limits. Both lungs are clear. No visible pleural effusions or pneumothorax. Biapical pleuroparenchymal scarring. No acute osseous abnormality. IMPRESSION: No evidence of acute cardiopulmonary disease. Electronically Signed   By: Margaretha Sheffield MD   On: 05/12/2020 09:53   DG Chest Port 1 View  Result Date: 05/11/2020 CLINICAL DATA:  Acute kidney injury EXAM: PORTABLE CHEST 1 VIEW COMPARISON:  May 08, 2020 FINDINGS: The heart size is stable. The lung volumes are low. There are probable small bilateral pleural effusions with adjacent atelectasis, left worse than right. There is no pneumothorax, however evaluation of the right lung apex is limited secondary to overlapping osseous structures. There is no acute osseous abnormality. IMPRESSION: Low lung volumes with probable small bilateral pleural effusions, left greater than right. No definite focal infiltrate. Electronically Signed   By: Constance Holster M.D.   On: 05/11/2020 20:02   DG CHEST PORT 1 VIEW  Result Date: 05/01/2020 CLINICAL DATA:  Cough. EXAM: PORTABLE CHEST 1 VIEW COMPARISON:  April 26, 2020. FINDINGS: Improved opacities in the right lung. No visible pleural effusions or pneumothorax. Cardiac silhouette is within normal limits. Enteric tube courses below the diaphragm in outside the field of view. Right subclavian approach PICC with the tip projecting at the right atrium. No acute osseous abnormality. IMPRESSION: Improved opacities in right lung, suggesting improving pneumonia. Electronically Signed   By: Margaretha Sheffield MD   On: 05/01/2020 10:56   DG CHEST PORT 1 VIEW  Result Date:  04/26/2020 CLINICAL DATA:  Cough. EXAM: PORTABLE CHEST 1 VIEW COMPARISON:  April 24, 2020. FINDINGS: The heart size and mediastinal contours are within normal limits. Feeding tube is seen entering stomach. Right-sided PICC line is unchanged. No pneumothorax is noted. Stable right lung opacities are noted concerning for pneumonia. Minimal left basilar subsegmental atelectasis is noted. The visualized skeletal structures are unremarkable. IMPRESSION: Stable right lung opacities are noted concerning for pneumonia. Minimal left basilar subsegmental atelectasis is noted. Electronically Signed   By: Marijo Conception M.D.   On: 04/26/2020 09:35   DG CHEST PORT 1 VIEW  Result Date: 04/24/2020 CLINICAL DATA:  Hypoxia. EXAM: PORTABLE CHEST 1 VIEW COMPARISON:  One-view  chest x-ray 04/22/2020 FINDINGS: Heart size is normal. Interstitial and airspace opacities bilaterally are proving. Overall lung volumes are improving. Small bore feeding tube courses off the inferior border the film. Right-sided PICC line is stable. IMPRESSION: Improving aeration of both lungs with decreasing interstitial and airspace opacities. Electronically Signed   By: San Morelle M.D.   On: 04/24/2020 05:11   DG Chest Port 1 View  Result Date: 04/22/2020 CLINICAL DATA:  Acute respiratory failure, aspiration airway. EXAM: PORTABLE CHEST 1 VIEW COMPARISON:  April 21, 2020. FINDINGS: Bibasilar opacities. Left basilar consolidation silhouetting left hemidiaphragm, slightly progressed. No visible pleural effusions or pneumothorax. Similar cardiomediastinal silhouette. Right PICC tip projects at the right atrium, unchanged. Enteric tube courses below the diaphragm outside the field of view. No acute osseous abnormality. IMPRESSION: Bibasilar airspace opacities with slightly progressed dense consolidation at the left lung base. Findings are concerning for pneumonia and/or aspiration. Electronically Signed   By: Margaretha Sheffield MD   On:  04/22/2020 09:12   DG CHEST PORT 1 VIEW  Result Date: 04/21/2020 CLINICAL DATA:  59 year old female with hypoxia. EXAM: PORTABLE CHEST 1 VIEW COMPARISON:  Portable chest 04/16/2020 and earlier. FINDINGS: Portable AP semi upright view at 1043 hours. Right PICC line and enteric feeding tube and been placed. PICC line tip projects at the right atrium. Feeding tube courses into the left abdomen, tip not included. No endotracheal tube identified. Lower lung volumes with increasing bibasilar opacity including some retrocardiac air bronchograms now. However, right central lung and perihilar ventilation has improved. No superimposed pneumothorax or pulmonary edema. No definite pleural effusion. No acute osseous abnormality identified. Paucity of bowel gas in the upper abdomen. IMPRESSION: 1. Lower lung volumes and increasing bibasilar opacity, left lower lobe consolidation suspected now. 2. However, right mid lung and perihilar ventilation has improved. 3. Right PICC line and enteric feeding tube placed. Electronically Signed   By: Genevie Ann M.D.   On: 04/21/2020 11:08   DG Swallowing Func-Speech Pathology  Result Date: 05/02/2020 Objective Swallowing Evaluation: Type of Study: MBS-Modified Barium Swallow Study  Patient Details Name: KHADIJA THIER MRN: [id number] Date of Birth: Jul 27, 1960 Today's Date: 05/02/2020 Time: SLP Start Time (ACUTE ONLY): 1330 -SLP Stop Time (ACUTE ONLY): 1400 SLP Time Calculation (min) (ACUTE ONLY): 30 min Past Medical History: Past Medical History: Diagnosis Date  Constipation   Dysphagia   Fracture   R foot  GERD (gastroesophageal reflux disease)   History of shingles 10/2016  Mental retardation   lesion in head  Seizures ([place])   "not fully controlled on max doses of meds" (Neuro ofc note 12/2014)  Thrombocytopenia ([place])   Tremor  Past Surgical History: Past Surgical History: Procedure Laterality Date  BIOPSY  09/16/2014  Procedure: BIOPSY;  Surgeon: Rogene Houston, MD;   Location: [facility];  Service: Endoscopy;;  CATARACT EXTRACTION    both eyes, May of 2015  COLONOSCOPY    COLONOSCOPY WITH PROPOFOL N/A 11/20/2018  Procedure: COLONOSCOPY WITH PROPOFOL;  Surgeon: Rogene Houston, MD;  Location: [facility];  Service: Endoscopy;  Laterality: N/A;  ESOPHAGEAL DILATION N/A 09/16/2014  Procedure: ESOPHAGEAL DILATION WITH 54FR MALONEY DILATOR;  Surgeon: Rogene Houston, MD;  Location: [facility];  Service: Endoscopy;  Laterality: N/A;  ESOPHAGOGASTRODUODENOSCOPY (EGD) WITH PROPOFOL N/A 09/16/2014  Procedure: ESOPHAGOGASTRODUODENOSCOPY (EGD) WITH PROPOFOL;  Surgeon: Rogene Houston, MD;  Location: [facility];  Service: Endoscopy;  Laterality: N/A;  MOUTH SURGERY    POLYPECTOMY  11/20/2018  Procedure: POLYPECTOMY;  Surgeon:  Rogene Houston, MD;  Location: [facility];  Service: Endoscopy;;  colon  Skin graft to gum Right 08/2013  TONSILLECTOMY AND ADENOIDECTOMY    TOTAL ABDOMINAL HYSTERECTOMY   HPI: 59 yo admitted 11/12 with sepsis, pyelonephritis and encephalopathy. PMhx: epilepsy, intellectual disability, GERD, EGD with dilation 2016.  Initial limited clinical swallow eval completed 11/17 with recs to continue NPO.  SLP s/o at that time due to ongoing lethargy.  Cortrak for nutrition.  Has been having breakthrough seizures.  Improved mentation, diet has been advanced slowly as she has improved.  Subjective: alert, very confused Assessment / Plan / Recommendation CHL IP CLINICAL IMPRESSIONS 05/02/2020 Clinical Impression Pt presents with quite functional swallow.  She demonstrated some initial reluctance to eat the solid/pureed barium, which caused hesitation while eating, requiring prompts to chew and swallow (this creates what appears to be an oral delay on the imaging).  She demonstrated reliable laryngeal vestibule closure across all trials, excluding one episode of penetration of thin liquids (considered WFL). There was no aspiration, despite large, successive boluses of thin and  mixed solid/thin liquid consistencies.  There was trace vallecular residue, insignificant.  Recommend advancing diet to a dysphagia 3, thin liquids and D/Cing the cortrak.  Pt will need encouragement and assistance to feed herself - recommend removing the mitts as much as able (which should be more feasible with cortrak removed) to encourage independence.  SLP will follow briefly to ensure safety.  Sent a message via Secure Chat to Dr. [place] and spoke with pt's RN.  SLP Visit Diagnosis Dysphagia, unspecified (R13.10) Attention and concentration deficit following -- Frontal lobe and executive function deficit following -- Impact on safety and function --   CHL IP TREATMENT RECOMMENDATION 05/02/2020 Treatment Recommendations Therapy as outlined in treatment plan below   Prognosis 05/02/2020 Prognosis for Safe Diet Advancement Good Barriers to Reach Goals -- Barriers/Prognosis Comment -- [facility] IP DIET RECOMMENDATION 05/02/2020 SLP Diet Recommendations Dysphagia 3 (Mech soft) solids;Thin liquid Liquid Administration via Cup;Straw Medication Administration Whole meds with puree Compensations Minimize environmental distractions Postural Changes --   [facility] IP OTHER RECOMMENDATIONS 05/02/2020 Recommended Consults -- Oral Care Recommendations Oral care BID Other Recommendations --   [facility] IP FOLLOW UP RECOMMENDATIONS 05/01/2020 Follow up Recommendations Skilled Nursing facility   [facility] IP FREQUENCY AND DURATION 05/02/2020 Speech Therapy Frequency (ACUTE ONLY) min 2x/week Treatment Duration 1 week      [facility] IP ORAL PHASE 05/02/2020 Oral Phase WFL Oral - Pudding Teaspoon -- Oral - Pudding Cup -- Oral - Honey Teaspoon -- Oral - Honey Cup -- Oral - Nectar Teaspoon -- Oral - Nectar Cup -- Oral - Nectar Straw -- Oral - Thin Teaspoon -- Oral - Thin Cup -- Oral - Thin Straw -- Oral - Puree -- Oral - Mech Soft -- Oral - Regular -- Oral - Multi-Consistency -- Oral - Pill -- Oral Phase - Comment --  [facility] IP PHARYNGEAL PHASE 05/02/2020  Pharyngeal Phase WFL Pharyngeal- Pudding Teaspoon -- Pharyngeal -- Pharyngeal- Pudding Cup -- Pharyngeal -- Pharyngeal- Honey Teaspoon -- Pharyngeal -- Pharyngeal- Honey Cup -- Pharyngeal -- Pharyngeal- Nectar Teaspoon -- Pharyngeal -- Pharyngeal- Nectar Cup -- Pharyngeal -- Pharyngeal- Nectar Straw -- Pharyngeal -- Pharyngeal- Thin Teaspoon -- Pharyngeal -- Pharyngeal- Thin Cup -- Pharyngeal -- Pharyngeal- Thin Straw -- Pharyngeal -- Pharyngeal- Puree -- Pharyngeal -- Pharyngeal- Mechanical Soft -- Pharyngeal -- Pharyngeal- Regular -- Pharyngeal -- Pharyngeal- Multi-consistency -- Pharyngeal -- Pharyngeal- Pill -- Pharyngeal -- Pharyngeal Comment --  No flowsheet data  found. [name] 05/02/2020, 2:59 PM              Overnight EEG with video  Result Date: 04/21/2020 Lora Havens, MD     04/22/2020  9:11 AM Patient Name: CADYNCE GARRETTE MRN: [id number] Epilepsy Attending: Lora Havens Referring Physician/Provider: Dr Zeb Comfort Duration: 04/20/2020 1109 to 04/21/2020 0400, 2878- 1600  Patient history: 59yo F with h/o epilepsy now with ams. EEG to evaluate for seizure  Level of alertness: Lethargic  AEDs during EEG study: LCM, VPA, Clonopin, LEV  Technical aspects: This EEG study was done with scalp electrodes positioned according to the 10-20 International system of electrode placement. Electrical activity was acquired at a sampling rate of 500Hz  and reviewed with a high frequency filter of 70Hz  and a low frequency filter of 1Hz . EEG data were recorded continuously and digitally stored.  Description: EEG showed continuous generalized 3 to 6 Hz theta-delta slowing. Generalized spike and wave discharges were also noted, at times lasting 5-7seconds consistent with brief-ictal-interictal rhythmic discharges ( BIRDS).   Event button was pressed on 04/20/2020 at 2154. Patient was noted to have sudden arm and leg elevation followed by jerking briefly for 2-3 seconds. Concomitant eeg  showed generalized polyspikes lasting 10-12 seconds.  ABNORMALITY - Seizure, generalized - BIRDs - Spike and wave, generalized - Continuous slow, generalized  IMPRESSION: This study showed one seizure on 04/20/2020 at 2154 during which patient was noted to have sudden arm and leg elevation followed by jerking for 2 to 3 seconds with generalized onset.  Additionally there is evidence of moderate to severe diffuse encephalopathy, likely secondary to seizures.  Priyanka O Yadav   VAS [place] LOWER EXTREMITY VENOUS (DVT)  Result Date: 04/26/2020  Lower Venous DVT Study Indications: Fever.  Limitations: PT w/ intellectual disability and altered mental status. Agitated state, PT unable to remain stationary during exam. Comparison Study: No prior studies. Performing Technologist: Darlin Coco, RDMS  Examination Guidelines: A complete evaluation includes B-mode imaging, spectral Doppler, color Doppler, and power Doppler as needed of all accessible portions of each vessel. Bilateral testing is considered an integral part of a complete examination. Limited examinations for reoccurring indications may be performed as noted. The reflux portion of the exam is performed with the patient in reverse Trendelenburg.  +---------+---------------+---------+-----------+----------+--------------+  RIGHT     Compressibility Phasicity Spontaneity Properties Thrombus Aging  +---------+---------------+---------+-----------+----------+--------------+  CFV       Full            Yes       Yes                                    +---------+---------------+---------+-----------+----------+--------------+  SFJ       Full                                                             +---------+---------------+---------+-----------+----------+--------------+  FV Prox   Full                                                             +---------+---------------+---------+-----------+----------+--------------+  FV Mid    Full                                                              +---------+---------------+---------+-----------+----------+--------------+  FV Distal Full                                                             +---------+---------------+---------+-----------+----------+--------------+  PFV       Full                                                             +---------+---------------+---------+-----------+----------+--------------+  POP       Full            Yes       Yes                                    +---------+---------------+---------+-----------+----------+--------------+  PTV       Full                                                             +---------+---------------+---------+-----------+----------+--------------+  PERO      Full                                                             +---------+---------------+---------+-----------+----------+--------------+   +---------+---------------+---------+-----------+----------+-------------------+  LEFT      Compressibility Phasicity Spontaneity Properties Thrombus Aging       +---------+---------------+---------+-----------+----------+-------------------+  CFV       Full            Yes       Yes                                         +---------+---------------+---------+-----------+----------+-------------------+  SFJ       Full                                                                  +---------+---------------+---------+-----------+----------+-------------------+  FV Prox   Full                                                                  +---------+---------------+---------+-----------+----------+-------------------+  FV Mid    Full                                                                  +---------+---------------+---------+-----------+----------+-------------------+  FV Distal Full                                                                  +---------+---------------+---------+-----------+----------+-------------------+  PFV       Full                                                                   +---------+---------------+---------+-----------+----------+-------------------+  POP       Full            Yes       Yes                                         +---------+---------------+---------+-----------+----------+-------------------+  PTV       Full                                                                  +---------+---------------+---------+-----------+----------+-------------------+  PERO                                                       Not well visualized  +---------+---------------+---------+-----------+----------+-------------------+     Summary: RIGHT: - There is no evidence of deep vein thrombosis in the lower extremity.  - No cystic structure found in the popliteal fossa.  LEFT: - There is no evidence of deep vein thrombosis in the lower extremity. However, portions of this examination were limited- see technologist comments above.  - No cystic structure found in the popliteal fossa.  *See table(s) above for measurements and observations. Electronically signed by Monica Martinez MD on 04/26/2020 at 3:00:12 PM.    Final       Subjective:   Discharge Exam: Vitals:   05/18/20 0832 05/18/20 1109  BP: 120/75 125/72  Pulse: 86 99  Resp: 18 18  Temp: 97.8 F (36.6 C) 97.9 F (36.6 C)  SpO2: 97% 97%   Vitals:   05/18/20 0732 05/18/20 0831 05/18/20 0832 05/18/20 1109  BP:  120/75 120/75 125/72  Pulse:   86 99  Resp:   18 18  Temp:   97.8 F (36.6 C) 97.9 F (36.6 C)  TempSrc:  Oral Oral  SpO2: 100%  97% 97%  Weight:      Height:        General: Not in obvious distress, alert awake . HENT:   No scleral pallor or icterus noted. Oral mucosa is moist.  Chest:   Clear to auscultation bilaterally. No crackles or wheezes.  CVS: S1 &S2 heard. No murmur.  Regular rate and rhythm. Abdomen: Soft, nontender, nondistended.  Bowel sounds are normoactive. Extremities: No cyanosis, or edema.   Psych: Alert, awake.   Normal mood and affect CNS:  No cranial nerve deficits.  Power equal in all extremities.   Skin: Warm and dry.  No rashes noted.   The results of significant diagnostics from this hospitalization (including imaging, microbiology, ancillary and laboratory) are listed below for reference.     Microbiology: Recent Results (from the past 240 hour(s))  Culture, Urine     Status: Abnormal   Collection Time: 05/10/20  5:39 PM   Specimen: Urine, Clean Catch  Result Value Ref Range Status   Specimen Description URINE, CLEAN CATCH  Final   Special Requests   Final    NONE Performed at [facility], [address], [place], [place] [address]    Culture (A)  Final    >=100,000 COLONIES/mL ESCHERICHIA COLI Confirmed Extended Spectrum Beta-Lactamase Producer (ESBL).  In bloodstream infections from ESBL organisms, carbapenems are preferred over piperacillin/tazobactam. They are shown to have a lower risk of mortality.    Report Status 05/12/2020 FINAL  Final   Organism ID, Bacteria ESCHERICHIA COLI (A)  Final      Susceptibility   Escherichia coli - MIC*    AMPICILLIN >=32 RESISTANT Resistant     CEFAZOLIN >=64 RESISTANT Resistant     CEFEPIME 16 RESISTANT Resistant     CEFTRIAXONE >=64 RESISTANT Resistant     CIPROFLOXACIN >=4 RESISTANT Resistant     GENTAMICIN >=16 RESISTANT Resistant     IMIPENEM <=0.25 SENSITIVE Sensitive     NITROFURANTOIN 128 RESISTANT Resistant     TRIMETH/SULFA >=320 RESISTANT Resistant     AMPICILLIN/SULBACTAM >=32 RESISTANT Resistant     PIP/TAZO 16 SENSITIVE Sensitive     * >=100,000 COLONIES/mL ESCHERICHIA COLI  Culture, blood (routine x 2)     Status: Abnormal   Collection Time: 05/11/20  5:37 PM   Specimen: BLOOD RIGHT HAND  Result Value Ref Range Status   Specimen Description BLOOD RIGHT HAND  Final   Special Requests   Final    BOTTLES DRAWN AEROBIC AND ANAEROBIC Blood Culture adequate volume   Culture  Setup Time   Final    GRAM NEGATIVE  RODS IN BOTH AEROBIC AND ANAEROBIC BOTTLES CRITICAL RESULT CALLED TO, READ BACK BY AND VERIFIED WITH: [place] F. [id number] FCP Performed at [facility], [address] [place], [place] [address]    Culture (A)  Final    ESCHERICHIA COLI Confirmed Extended Spectrum Beta-Lactamase Producer (ESBL).  In bloodstream infections from ESBL organisms, carbapenems are preferred over piperacillin/tazobactam. They are shown to have a lower risk of mortality.    Report Status 05/14/2020 FINAL  Final   Organism ID, Bacteria ESCHERICHIA COLI  Final      Susceptibility   Escherichia coli - MIC*    AMPICILLIN >=32 RESISTANT Resistant     CEFAZOLIN >=64 RESISTANT Resistant     CEFEPIME >=32 RESISTANT Resistant     CEFTAZIDIME >=64 RESISTANT Resistant     CEFTRIAXONE >=64 RESISTANT Resistant  CIPROFLOXACIN >=4 RESISTANT Resistant     GENTAMICIN >=16 RESISTANT Resistant     IMIPENEM <=0.25 SENSITIVE Sensitive     TRIMETH/SULFA >=320 RESISTANT Resistant     AMPICILLIN/SULBACTAM >=32 RESISTANT Resistant     PIP/TAZO 16 SENSITIVE Sensitive     * ESCHERICHIA COLI  Blood Culture ID Panel (Reflexed)     Status: Abnormal   Collection Time: 05/11/20  5:37 PM  Result Value Ref Range Status   Enterococcus faecalis NOT DETECTED NOT DETECTED Final   Enterococcus Faecium NOT DETECTED NOT DETECTED Final   Listeria monocytogenes NOT DETECTED NOT DETECTED Final   Staphylococcus species NOT DETECTED NOT DETECTED Final   Staphylococcus aureus (BCID) NOT DETECTED NOT DETECTED Final   Staphylococcus epidermidis NOT DETECTED NOT DETECTED Final   Staphylococcus lugdunensis NOT DETECTED NOT DETECTED Final   Streptococcus species NOT DETECTED NOT DETECTED Final   Streptococcus agalactiae NOT DETECTED NOT DETECTED Final   Streptococcus pneumoniae NOT DETECTED NOT DETECTED Final   Streptococcus pyogenes NOT DETECTED NOT DETECTED Final   A.calcoaceticus-baumannii NOT DETECTED NOT DETECTED Final    Bacteroides fragilis NOT DETECTED NOT DETECTED Final   Enterobacterales DETECTED (A) NOT DETECTED Final    Comment: Enterobacterales represent a large order of gram negative bacteria, not a single organism. CRITICAL RESULT CALLED TO, READ BACK BY AND VERIFIED WITH: PHARMD JEREMY F. [id number] FCP    Enterobacter cloacae complex NOT DETECTED NOT DETECTED Final   Escherichia coli DETECTED (A) NOT DETECTED Final    Comment: CRITICAL RESULT CALLED TO, READ BACK BY AND VERIFIED WITH: PHARMD JEREMY F. [id number] FCP    Klebsiella aerogenes NOT DETECTED NOT DETECTED Final   Klebsiella oxytoca NOT DETECTED NOT DETECTED Final   Klebsiella pneumoniae NOT DETECTED NOT DETECTED Final   Proteus species NOT DETECTED NOT DETECTED Final   Salmonella species NOT DETECTED NOT DETECTED Final   Serratia marcescens NOT DETECTED NOT DETECTED Final   Haemophilus influenzae NOT DETECTED NOT DETECTED Final   Neisseria meningitidis NOT DETECTED NOT DETECTED Final   Pseudomonas aeruginosa NOT DETECTED NOT DETECTED Final   Stenotrophomonas maltophilia NOT DETECTED NOT DETECTED Final   Candida albicans NOT DETECTED NOT DETECTED Final   Candida auris NOT DETECTED NOT DETECTED Final   Candida glabrata NOT DETECTED NOT DETECTED Final   Candida krusei NOT DETECTED NOT DETECTED Final   Candida parapsilosis NOT DETECTED NOT DETECTED Final   Candida tropicalis NOT DETECTED NOT DETECTED Final   Cryptococcus neoformans/gattii NOT DETECTED NOT DETECTED Final   CTX-M ESBL DETECTED (A) NOT DETECTED Final    Comment: CRITICAL RESULT CALLED TO, READ BACK BY AND VERIFIED WITH: PHARMD JEREMY F. [phone] FCP (NOTE) Extended spectrum beta-lactamase detected. Recommend a carbapenem as initial therapy.      Carbapenem resistance IMP NOT DETECTED NOT DETECTED Final   Carbapenem resistance KPC NOT DETECTED NOT DETECTED Final   Carbapenem resistance NDM NOT DETECTED NOT DETECTED Final   Carbapenem resist OXA 48 LIKE NOT  DETECTED NOT DETECTED Final   Carbapenem resistance VIM NOT DETECTED NOT DETECTED Final    Comment: Performed at [facility], [place] [address] [place], [place] [address]  Culture, blood (routine x 2)     Status: Abnormal   Collection Time: 05/11/20  5:51 PM   Specimen: BLOOD  Result Value Ref Range Status   Specimen Description BLOOD RIGHT ANTECUBITAL  Final   Special Requests   Final    BOTTLES DRAWN AEROBIC AND ANAEROBIC Blood Culture adequate  volume   Culture  Setup Time   Final    GRAM NEGATIVE RODS IN BOTH AEROBIC AND ANAEROBIC BOTTLES CRITICAL VALUE NOTED.  VALUE IS CONSISTENT WITH PREVIOUSLY REPORTED AND CALLED VALUE.    Culture (A)  Final    ESCHERICHIA COLI SUSCEPTIBILITIES PERFORMED ON PREVIOUS CULTURE WITHIN THE LAST 5 DAYS. Performed at [facility], [place] [address] [place], [place] [address]    Report Status 05/14/2020 FINAL  Final  Resp panel by RT-PCR (RSV, Flu A&B, Covid) Nasopharyngeal Swab     Status: None   Collection Time: 05/12/20  6:06 PM   Specimen: Nasopharyngeal Swab; Nasopharyngeal(NP) swabs in vial transport medium  Result Value Ref Range Status   SARS Coronavirus 2 by RT PCR NEGATIVE NEGATIVE Final    Comment: (NOTE) SARS-CoV-2 target nucleic acids are NOT DETECTED.  The SARS-CoV-2 RNA is generally detectable in upper respiratory specimens during the acute phase of infection. The lowest concentration of SARS-CoV-2 viral copies this assay can detect is 138 copies/mL. A negative result does not preclude SARS-Cov-2 infection and should not be used as the sole basis for treatment or other patient management decisions. A negative result may occur with  improper specimen collection/handling, submission of specimen other than nasopharyngeal swab, presence of viral mutation(s) within the areas targeted by this assay, and inadequate number of viral copies(<138 copies/mL). A negative result must be combined with clinical observations, patient  history, and epidemiological information. The expected result is Negative.  Fact Sheet for Patients:  EntrepreneurPulse.com.au  Fact Sheet for Healthcare Providers:  IncredibleEmployment.be  This test is no t yet approved or cleared by the [place] FDA and  has been authorized for detection and/or diagnosis of SARS-CoV-2 by FDA under an Emergency Use Authorization (EUA). This EUA will remain  in effect (meaning this test can be used) for the duration of the COVID-19 declaration under Section 564(b)(1) of the Act, 21 U.S.C.section 360bbb-3(b)(1), unless the authorization is terminated  or revoked sooner.       Influenza A by PCR NEGATIVE NEGATIVE Final   Influenza B by PCR NEGATIVE NEGATIVE Final    Comment: (NOTE) The Xpert Xpress SARS-CoV-2/FLU/RSV plus assay is intended as an aid in the diagnosis of influenza from Nasopharyngeal swab specimens and should not be used as a sole basis for treatment. Nasal washings and aspirates are unacceptable for Xpert Xpress SARS-CoV-2/FLU/RSV testing.  Fact Sheet for Patients: EntrepreneurPulse.com.au  Fact Sheet for Healthcare Providers: IncredibleEmployment.be  This test is not yet approved or cleared by the [place] FDA and has been authorized for detection and/or diagnosis of SARS-CoV-2 by FDA under an Emergency Use Authorization (EUA). This EUA will remain in effect (meaning this test can be used) for the duration of the COVID-19 declaration under Section 564(b)(1) of the Act, 21 U.S.C. section 360bbb-3(b)(1), unless the authorization is terminated or revoked.     Resp Syncytial Virus by PCR NEGATIVE NEGATIVE Final    Comment: (NOTE) Fact Sheet for Patients: EntrepreneurPulse.com.au  Fact Sheet for Healthcare Providers: IncredibleEmployment.be  This test is not yet approved or cleared by the [place] FDA  and has been authorized for detection and/or diagnosis of SARS-CoV-2 by FDA under an Emergency Use Authorization (EUA). This EUA will remain in effect (meaning this test can be used) for the duration of the COVID-19 declaration under Section 564(b)(1) of the Act, 21 U.S.C. section 360bbb-3(b)(1), unless the authorization is terminated or revoked.  Performed at [facility], [place] [address]  [place], [place] [address]      Labs: BNP (last 3 results) No results for input(s): BNP in the last 8760 hours. Basic Metabolic Panel: Recent Labs  Lab 05/13/20 0114 05/15/20 0759 05/16/20 0158 05/17/20 0128 05/18/20 0018  NA 135 135 135 136 139  K 4.1 3.4* 3.9 3.9 3.9  CL 104 98 100 102 103  CO2 21* 23 25 23 25   GLUCOSE 133* 97 112* 103* 102*  BUN 10 12 10 11 13   CREATININE 0.82 0.60 0.75 0.76 0.75  CALCIUM 8.2* 8.6* 8.8* 9.1 9.2   Liver Function Tests: Recent Labs  Lab 05/13/20 0114 05/15/20 0759 05/16/20 0158 05/17/20 0128 05/18/20 0018  AST 36 98* 89* 92* 72*  ALT 25 99* 105* 113* 104*  ALKPHOS 72 113 124 133* 136*  BILITOT 0.6 0.4 0.4 0.5 0.8  PROT 5.5* 6.0* 6.3* 6.2* 6.3*  ALBUMIN 2.1* 2.3* 2.3* 2.5* 2.5*   No results for input(s): LIPASE, AMYLASE in the last 168 hours. No results for input(s): AMMONIA in the last 168 hours. CBC: Recent Labs  Lab 05/11/20 1542 05/12/20 0403 05/13/20 0114 05/15/20 0759 05/16/20 0158 05/17/20 0128 05/18/20 0018  WBC 18.4*   < > 10.4 5.2 6.5 7.4 9.1  NEUTROABS 15.6*  --   --   --   --   --   --   HGB 9.9*   < > 7.7* 8.8* 8.5* 9.2* 9.3*  HCT 30.6*   < > 23.7* 27.0* 26.1* 28.0* 28.9*  MCV 93.9   < > 94.0 90.6 90.0 90.6 92.3  PLT 300   < > 221 217 231 258 274   < > = values in this interval not displayed.   Cardiac Enzymes: No results for input(s): CKTOTAL, CKMB, CKMBINDEX, TROPONINI in the last 168 hours. BNP: Invalid input(s): POCBNP CBG: Recent Labs  Lab 05/11/20 1635 05/11/20 2107 05/12/20 0551 05/12/20 1139  05/12/20 1703  GLUCAP 155* 113* 120* 111* 94   D-Dimer No results for input(s): DDIMER in the last 72 hours. Hgb A1c No results for input(s): HGBA1C in the last 72 hours. Lipid Profile No results for input(s): CHOL, HDL, LDLCALC, TRIG, CHOLHDL, LDLDIRECT in the last 72 hours. Thyroid function studies No results for input(s): TSH, T4TOTAL, T3FREE, THYROIDAB in the last 72 hours.  Invalid input(s): FREET3 Anemia work up No results for input(s): VITAMINB12, FOLATE, FERRITIN, TIBC, IRON, RETICCTPCT in the last 72 hours. Urinalysis    Component Value Date/Time   COLORURINE YELLOW 05/10/2020 1739   APPEARANCEUR CLOUDY (A) 05/10/2020 1739   LABSPEC 1.011 05/10/2020 1739   PHURINE 6.0 05/10/2020 1739   GLUCOSEU NEGATIVE 05/10/2020 1739   HGBUR MODERATE (A) 05/10/2020 1739   BILIRUBINUR NEGATIVE 05/10/2020 1739   KETONESUR NEGATIVE 05/10/2020 1739   PROTEINUR 100 (A) 05/10/2020 1739   UROBILINOGEN 0.2 10/26/2014 2340   NITRITE POSITIVE (A) 05/10/2020 1739   LEUKOCYTESUR LARGE (A) 05/10/2020 1739   Sepsis Labs Invalid input(s): PROCALCITONIN,  WBC,  LACTICIDVEN Microbiology Recent Results (from the past 240 hour(s))  Culture, Urine     Status: Abnormal   Collection Time: 05/10/20  5:39 PM   Specimen: Urine, Clean Catch  Result Value Ref Range Status   Specimen Description URINE, CLEAN CATCH  Final   Special Requests   Final    NONE Performed at [facility], [place] [address] [place], [place] [address]    Culture (A)  Final    >=100,000 COLONIES/mL ESCHERICHIA COLI Confirmed Extended Spectrum Beta-Lactamase Producer (ESBL).  In bloodstream infections from ESBL organisms, carbapenems are preferred over piperacillin/tazobactam. They are shown to have a lower risk of mortality.    Report Status 05/12/2020 FINAL  Final   Organism ID, Bacteria ESCHERICHIA COLI (A)  Final      Susceptibility   Escherichia coli - MIC*    AMPICILLIN >=32 RESISTANT Resistant     CEFAZOLIN >=64  RESISTANT Resistant     CEFEPIME 16 RESISTANT Resistant     CEFTRIAXONE >=64 RESISTANT Resistant     CIPROFLOXACIN >=4 RESISTANT Resistant     GENTAMICIN >=16 RESISTANT Resistant     IMIPENEM <=0.25 SENSITIVE Sensitive     NITROFURANTOIN 128 RESISTANT Resistant     TRIMETH/SULFA >=320 RESISTANT Resistant     AMPICILLIN/SULBACTAM >=32 RESISTANT Resistant     PIP/TAZO 16 SENSITIVE Sensitive     * >=100,000 COLONIES/mL ESCHERICHIA COLI  Culture, blood (routine x 2)     Status: Abnormal   Collection Time: 05/11/20  5:37 PM   Specimen: BLOOD RIGHT HAND  Result Value Ref Range Status   Specimen Description BLOOD RIGHT HAND  Final   Special Requests   Final    BOTTLES DRAWN AEROBIC AND ANAEROBIC Blood Culture adequate volume   Culture  Setup Time   Final    GRAM NEGATIVE RODS IN BOTH AEROBIC AND ANAEROBIC BOTTLES CRITICAL RESULT CALLED TO, READ BACK BY AND VERIFIED WITH: [place] F. [id number] FCP Performed at [facility], [address] [place], [place] [address]    Culture (A)  Final    ESCHERICHIA COLI Confirmed Extended Spectrum Beta-Lactamase Producer (ESBL).  In bloodstream infections from ESBL organisms, carbapenems are preferred over piperacillin/tazobactam. They are shown to have a lower risk of mortality.    Report Status 05/14/2020 FINAL  Final   Organism ID, Bacteria ESCHERICHIA COLI  Final      Susceptibility   Escherichia coli - MIC*    AMPICILLIN >=32 RESISTANT Resistant     CEFAZOLIN >=64 RESISTANT Resistant     CEFEPIME >=32 RESISTANT Resistant     CEFTAZIDIME >=64 RESISTANT Resistant     CEFTRIAXONE >=64 RESISTANT Resistant     CIPROFLOXACIN >=4 RESISTANT Resistant     GENTAMICIN >=16 RESISTANT Resistant     IMIPENEM <=0.25 SENSITIVE Sensitive     TRIMETH/SULFA >=320 RESISTANT Resistant     AMPICILLIN/SULBACTAM >=32 RESISTANT Resistant     PIP/TAZO 16 SENSITIVE Sensitive     * ESCHERICHIA COLI  Blood Culture ID Panel (Reflexed)     Status: Abnormal    Collection Time: 05/11/20  5:37 PM  Result Value Ref Range Status   Enterococcus faecalis NOT DETECTED NOT DETECTED Final   Enterococcus Faecium NOT DETECTED NOT DETECTED Final   Listeria monocytogenes NOT DETECTED NOT DETECTED Final   Staphylococcus species NOT DETECTED NOT DETECTED Final   Staphylococcus aureus (BCID) NOT DETECTED NOT DETECTED Final   Staphylococcus epidermidis NOT DETECTED NOT DETECTED Final   Staphylococcus lugdunensis NOT DETECTED NOT DETECTED Final   Streptococcus species NOT DETECTED NOT DETECTED Final   Streptococcus agalactiae NOT DETECTED NOT DETECTED Final   Streptococcus pneumoniae NOT DETECTED NOT DETECTED Final   Streptococcus pyogenes NOT DETECTED NOT DETECTED Final   A.calcoaceticus-baumannii NOT DETECTED NOT DETECTED Final   Bacteroides fragilis NOT DETECTED NOT DETECTED Final   Enterobacterales DETECTED (A) NOT DETECTED Final    Comment: Enterobacterales represent a large order of gram negative bacteria, not a single organism. CRITICAL RESULT CALLED TO, READ BACK BY AND  VERIFIED WITH: PHARMD JEREMY F. [phone] FCP    Enterobacter cloacae complex NOT DETECTED NOT DETECTED Final   Escherichia coli DETECTED (A) NOT DETECTED Final    Comment: CRITICAL RESULT CALLED TO, READ BACK BY AND VERIFIED WITH: PHARMD JEREMY F. [phone] FCP    Klebsiella aerogenes NOT DETECTED NOT DETECTED Final   Klebsiella oxytoca NOT DETECTED NOT DETECTED Final   Klebsiella pneumoniae NOT DETECTED NOT DETECTED Final   Proteus species NOT DETECTED NOT DETECTED Final   Salmonella species NOT DETECTED NOT DETECTED Final   Serratia marcescens NOT DETECTED NOT DETECTED Final   Haemophilus influenzae NOT DETECTED NOT DETECTED Final   Neisseria meningitidis NOT DETECTED NOT DETECTED Final   Pseudomonas aeruginosa NOT DETECTED NOT DETECTED Final   Stenotrophomonas maltophilia NOT DETECTED NOT DETECTED Final   Candida albicans NOT DETECTED NOT DETECTED Final   Candida auris  NOT DETECTED NOT DETECTED Final   Candida glabrata NOT DETECTED NOT DETECTED Final   Candida krusei NOT DETECTED NOT DETECTED Final   Candida parapsilosis NOT DETECTED NOT DETECTED Final   Candida tropicalis NOT DETECTED NOT DETECTED Final   Cryptococcus neoformans/gattii NOT DETECTED NOT DETECTED Final   CTX-M ESBL DETECTED (A) NOT DETECTED Final    Comment: CRITICAL RESULT CALLED TO, READ BACK BY AND VERIFIED WITH: PHARMD JEREMY F. [phone] FCP (NOTE) Extended spectrum beta-lactamase detected. Recommend a carbapenem as initial therapy.      Carbapenem resistance IMP NOT DETECTED NOT DETECTED Final   Carbapenem resistance KPC NOT DETECTED NOT DETECTED Final   Carbapenem resistance NDM NOT DETECTED NOT DETECTED Final   Carbapenem resist OXA 48 LIKE NOT DETECTED NOT DETECTED Final   Carbapenem resistance VIM NOT DETECTED NOT DETECTED Final    Comment: Performed at [facility], [place] [address], [place], [place] [address]  Culture, blood (routine x 2)     Status: Abnormal   Collection Time: 05/11/20  5:51 PM   Specimen: BLOOD  Result Value Ref Range Status   Specimen Description BLOOD RIGHT ANTECUBITAL  Final   Special Requests   Final    BOTTLES DRAWN AEROBIC AND ANAEROBIC Blood Culture adequate volume   Culture  Setup Time   Final    GRAM NEGATIVE RODS IN BOTH AEROBIC AND ANAEROBIC BOTTLES CRITICAL VALUE NOTED.  VALUE IS CONSISTENT WITH PREVIOUSLY REPORTED AND CALLED VALUE.    Culture (A)  Final    ESCHERICHIA COLI SUSCEPTIBILITIES PERFORMED ON PREVIOUS CULTURE WITHIN THE LAST 5 DAYS. Performed at [facility], [place] [address] [place], [place] [address]    Report Status 05/14/2020 FINAL  Final  Resp panel by RT-PCR (RSV, Flu A&B, Covid) Nasopharyngeal Swab     Status: None   Collection Time: 05/12/20  6:06 PM   Specimen: Nasopharyngeal Swab; Nasopharyngeal(NP) swabs in vial transport medium  Result Value Ref Range Status   SARS Coronavirus 2 by RT PCR NEGATIVE  NEGATIVE Final    Comment: (NOTE) SARS-CoV-2 target nucleic acids are NOT DETECTED.  The SARS-CoV-2 RNA is generally detectable in upper respiratory specimens during the acute phase of infection. The lowest concentration of SARS-CoV-2 viral copies this assay can detect is 138 copies/mL. A negative result does not preclude SARS-Cov-2 infection and should not be used as the sole basis for treatment or other patient management decisions. A negative result may occur with  improper specimen collection/handling, submission of specimen other than nasopharyngeal swab, presence of viral mutation(s) within the areas targeted by this assay,  and inadequate number of viral copies(<138 copies/mL). A negative result must be combined with clinical observations, patient history, and epidemiological information. The expected result is Negative.  Fact Sheet for Patients:  EntrepreneurPulse.com.au  Fact Sheet for Healthcare Providers:  IncredibleEmployment.be  This test is no t yet approved or cleared by the [place] FDA and  has been authorized for detection and/or diagnosis of SARS-CoV-2 by FDA under an Emergency Use Authorization (EUA). This EUA will remain  in effect (meaning this test can be used) for the duration of the COVID-19 declaration under Section 564(b)(1) of the Act, 21 U.S.C.section 360bbb-3(b)(1), unless the authorization is terminated  or revoked sooner.       Influenza A by PCR NEGATIVE NEGATIVE Final   Influenza B by PCR NEGATIVE NEGATIVE Final    Comment: (NOTE) The Xpert Xpress SARS-CoV-2/FLU/RSV plus assay is intended as an aid in the diagnosis of influenza from Nasopharyngeal swab specimens and should not be used as a sole basis for treatment. Nasal washings and aspirates are unacceptable for Xpert Xpress SARS-CoV-2/FLU/RSV testing.  Fact Sheet for Patients: EntrepreneurPulse.com.au  Fact Sheet for Healthcare  Providers: IncredibleEmployment.be  This test is not yet approved or cleared by the [place] FDA and has been authorized for detection and/or diagnosis of SARS-CoV-2 by FDA under an Emergency Use Authorization (EUA). This EUA will remain in effect (meaning this test can be used) for the duration of the COVID-19 declaration under Section 564(b)(1) of the Act, 21 U.S.C. section 360bbb-3(b)(1), unless the authorization is terminated or revoked.     Resp Syncytial Virus by PCR NEGATIVE NEGATIVE Final    Comment: (NOTE) Fact Sheet for Patients: EntrepreneurPulse.com.au  Fact Sheet for Healthcare Providers: IncredibleEmployment.be  This test is not yet approved or cleared by the [place] FDA and has been authorized for detection and/or diagnosis of SARS-CoV-2 by FDA under an Emergency Use Authorization (EUA). This EUA will remain in effect (meaning this test can be used) for the duration of the COVID-19 declaration under Section 564(b)(1) of the Act, 21 U.S.C. section 360bbb-3(b)(1), unless the authorization is terminated or revoked.  Performed at [facility], [place] [address], [place], [place] [address]      Time coordinating discharge: 40 minutes  SIGNED:   Elvin So, MD  [facility] 05/18/2020, 12:36 PM

## 2020-05-18 NOTE — Plan of Care (Signed)
Plan of care reviewed.   Problem: Health Behavior Planning: Pt's not compliant, refused all bedtime medication tonight. Goal: Ability to manage health-related needs will improve Outcome: Hopefully progressing in daytime.  Problem: Clinical Measurements: ESBL and sepsis Goal: Will remain free from infection Outcome: Progressing: afebrile, hemodynamically stable. On antibiotics IV.   Problem: Clinical Measurements: Had evident of Seizure. Auto airway protection has been compromised. Hx of dysphagia. High risk of aspiration. Goal: Respiratory complications will improve Outcome: Progressing   Problem: Clinical Measurements: Hx of septic shock,  Goal: Cardiovascular complication will be avoided Outcome: Progressing: now she's hemodynamically stable.   Problem: Nutrition: On dysphagia III diet. Reported of low appetite. Goal: Adequate nutrition will be maintained Outcome: Progressing: need set up and assist for her meal.   Problem: Elimination: Incontinent urin and bowel. Goal: Will not experience complications related to bowel motility Outcome: Progressing: using PureWick, had adequate urine output.   Problem: Skin Integrity: Evident of old pressure ulcer under sacral area. Foam  dressing applied, skin dry and clean.  Goal: Risk for impaired skin integrity will decrease Outcome: Progressing  Problem: Safety: Bed pads applied on side rails. Bed alarm activated. Goal: Ability to remain free from injury will improve Outcome: Progressing   Kennyth Lose, RN

## 2020-05-18 NOTE — Progress Notes (Signed)
Maria Staggers, MD  Physician  Physical Medicine and Rehabilitation  PMR Pre-admission     Signed  Date of Service:  05/18/2020 10:36 AM      Related encounter: Admission (Current) from 05/12/2020 in [facility] 4E CV SURGICAL PROGRESSIVE CARE       Signed          Show:Clear all [x] Manual[x] Template[x] Copied  Added by: [x] Cristina Gong, RN[x] Maria Staggers, MD   [] Hover for details  PMR Admission Coordinator Pre-Admission Assessment   Patient: Maria [name] is an 59 y.o., female MRN: [id number] DOB: [birth year] Height: 5' 6"  (167.6 cm) Weight: 58 kg   Insurance Information HMO:     PPO:      PCP:      IPA:      80/20:      OTHER:  PRIMARY: Medicare a and b      Policy#: [id number]      Subscriber: pt Benefits:  Phone #: passport one online     Name: 05/02/2020 Eff. Date: 06/03/1994     Deduct: $1484      Out of Pocket Max: none      Life Max: none CIR: 1005      SNF: 20 full days Outpatient: 80%     Co-Pay: 20% Home Health: 100%      Co-Pay: none DME: 80%     Co-Pay: 20% Providers: pt choice  SECONDARY: Tricare for Life      Policy#: [id number]       Financial Counselor:       Phone#:    The "Data Collection Information Summary" for patients in Inpatient Rehabilitation Facilities with attached "Privacy Act [place] Records" was provided and verbally reviewed with: Family   Emergency Contact Information         Contact Information     Name Relation Home Work Mobile    [place] Mother [phone]   [phone]    [facility] Sister [phone]        [name], [name]     [phone]         Current Medical History  Patient Admitting Diagnosis: Debility due to sepsis   History of Present Illness: 59 year old female with past medical history of intellectual delay, seizure disorder, chronic diastolic CHF, GERD, chronic constipation. Originally admitted 04/14/2020 at [facility]  Diagnosed with septic shock requiring  vasopressors, presumed form urinary source and transferred to [facility] ICU. CT san  suspicious for pyelonephritis and small abscess in the lower pole of th left kidney. Follow-up ultrasound 11 days later showed stable lesion felt to represent a small perinephric abscess versus renal cyst.  Blood cultures drawn after antibiotics were negative but urine culture grew E. coli-not ESBL.  Completed 14-day course of ceftriaxone.  Hospital course complicated by breakthrough seizures, delirium and agitation requiring Precedex drip.  Urology did not feel the abscess required any further intervention.  Subsequently admitted to CIR on 05/05/2020.  She was doing well until 05/10/2020 when she developed fever, UA suggestive of UTI, urine culture grew E. coli, briefly on Keflex, TRH consulted 12/9, final cultures were not back, treated with aggressive IV fluids due to sepsis, elevated lactate and AKI and changed to IV ceftriaxone.  BCID 12/10+ for gram-negative rods and final urine culture shows ESBL E. coli.  Antibiotics changed to IV meropenem, ID consulted.  Patient transferring to acute [facility] for close monitoring and management on 05/12/2020.   Sepsis has resolved.Responded well to meropenem and switched to  Invanz as per ID-Recent imaging shows evolving 12 mm likely abscess.  No distinct fluid collection that looked amenable to drainage on CT. ID has discussed with IR on 12/13 and it is not amenable for drainage, plan is to continue on IV Invanz.  She needs CT scan in about 2 weeks around 05/26/2020 to reassess the renal abscess that was not drained to guide duration of therapy.  She can continue Invanz in  [place]. To check ESR/CRP next week. Monitor for further seizure activity while on Invanz.    Klonopin and Seroquel adjusted to minimize sedation. Multiple episodes of nausea needing Zofran and Reglan. On Dysphagia 3 diet as tolerated. History of breakthrough seizures continued current AED with Vimpat, Valproate Klonopin. Had  another episode of seizure activity on acute. Neurology consulted and to continue to taper doses of Ativan. Lovenox for DVT prophylaxis.    Patient's medical record from [facility]  has been reviewed by the rehabilitation admission coordinator and physician.   Past Medical History      Past Medical History:  Diagnosis Date  . Constipation    . Dysphagia    . Fracture      R foot  . GERD (gastroesophageal reflux disease)    . History of shingles 10/2016  . Mental retardation      lesion in head  . Seizures ([place])      "not fully controlled on max doses of meds" (Neuro ofc note 12/2014)  . Thrombocytopenia ([place])    . Tremor        Family History   family history includes Diabetes in her father; High Cholesterol in her mother; High blood pressure in her mother.   Prior Rehab/Hospitalizations Has the patient had prior rehab or hospitalizations prior to admission? Yes CIR 05/2020 prior to readmit to acute   Has the patient had major surgery during 100 days prior to admission? No               Current Medications   Current Facility-Administered Medications:  .  acetaminophen (TYLENOL) suppository 650 mg, 650 mg, Rectal, Q4H PRN, Modena Jansky, MD, 650 mg at 05/13/20 0256 .  acetaminophen (TYLENOL) tablet 650 mg, 650 mg, Oral, Q4H PRN, Modena Jansky, MD, 650 mg at 05/15/20 0434 .  alum & mag hydroxide-simeth (MAALOX/MYLANTA) 200-200-20 MG/5ML suspension 30 mL, 30 mL, Oral, Q4H PRN, Hongalgi, Anand D, MD .  atorvastatin (LIPITOR) tablet 20 mg, 20 mg, Oral, QPM, Hongalgi, Anand D, MD, 20 mg at 05/17/20 1819 .  benzonatate (TESSALON) capsule 200 mg, 200 mg, Oral, TID, Hongalgi, Anand D, MD, 200 mg at 05/17/20 1518 .  bisacodyl (DULCOLAX) suppository 10 mg, 10 mg, Rectal, Daily PRN, Hongalgi, Anand D, MD .  chlorhexidine (PERIDEX) 0.12 % solution 15 mL, 15 mL, Mouth Rinse, BID, Kc, Ramesh, MD, 15 mL at 05/17/20 0918 .  clonazePAM (KLONOPIN) disintegrating tablet 0.25 mg,  0.25 mg, Oral, BID, Dana Allan I, MD, 0.25 mg at 05/18/20 0828 .  divalproex (DEPAKOTE) DR tablet 1,000 mg, 1,000 mg, Oral, QHS, Kris Mouton, RPH .  divalproex (DEPAKOTE) DR tablet 500 mg, 500 mg, Oral, Daily, Kris Mouton, RPH, 500 mg at 05/18/20 2505 .  docusate sodium (COLACE) capsule 100 mg, 100 mg, Oral, BID, Hongalgi, Anand D, MD, 100 mg at 05/18/20 0834 .  enoxaparin (LOVENOX) injection 40 mg, 40 mg, Subcutaneous, Q24H, Hongalgi, Anand D, MD, 40 mg at 05/18/20 0840 .  ertapenem (INVANZ) 1,000 mg in sodium chloride  0.9 % 100 mL IVPB, 1 g, Intravenous, Q24H, Vu, Trung T, MD, Last Rate: 200 mL/hr at 05/17/20 1519, 1,000 mg at 05/17/20 1519 .  feeding supplement (ENSURE ENLIVE / ENSURE PLUS) liquid 237 mL, 237 mL, Oral, TID BM, Hongalgi, Anand D, MD, 237 mL at 05/17/20 1520 .  ipratropium-albuterol (DUONEB) 0.5-2.5 (3) MG/3ML nebulizer solution 3 mL, 3 mL, Nebulization, Q4H PRN, Hongalgi, Anand D, MD .  lacosamide (VIMPAT) tablet 200 mg, 200 mg, Oral, BID, Kc, Ramesh, MD, 200 mg at 05/18/20 0829 .  lip balm (CARMEX) ointment, , Topical, PRN, Hongalgi, Anand D, MD .  LORazepam (ATIVAN) injection 2 mg, 2 mg, Intravenous, PRN, Hongalgi, Anand D, MD .  LORazepam (ATIVAN) tablet 0.5 mg, 0.5 mg, Oral, Q6H, Kerney Elbe, MD, 0.5 mg at 05/18/20 0830 .  MEDLINE mouth rinse, 15 mL, Mouth Rinse, q12n4p, Hongalgi, Anand D, MD, 15 mL at 05/17/20 1520 .  MEDLINE mouth rinse, 15 mL, Mouth Rinse, q12n4p, Kc, Ramesh, MD, 15 mL at 05/17/20 1521 .  melatonin tablet 3 mg, 3 mg, Oral, QHS PRN, Hongalgi, Anand D, MD .  metoCLOPramide (REGLAN) injection 5 mg, 5 mg, Intravenous, Q8H PRN, Kc, Ramesh, MD, 5 mg at 05/17/20 0918 .  metoprolol tartrate (LOPRESSOR) tablet 50 mg, 50 mg, Oral, BID, Kc, Ramesh, MD, 50 mg at 05/17/20 0918 .  mometasone-formoterol (DULERA) 200-5 MCG/ACT inhaler 2 puff, 2 puff, Inhalation, BID, Hongalgi, Lenis Dickinson, MD, 2 puff at 05/18/20 0731 .  multivitamin with minerals tablet 1  tablet, 1 tablet, Oral, Daily, Hongalgi, Lenis Dickinson, MD, 1 tablet at 05/17/20 [phone] .  ondansetron (ZOFRAN) injection 4 mg, 4 mg, Intravenous, Q6H PRN, Kc, Ramesh, MD, 4 mg at 05/16/20 1658 .  pantoprazole (PROTONIX) EC tablet 40 mg, 40 mg, Oral, QHS, Kris Mouton, RPH, 40 mg at 05/16/20 2151 .  phenol (CHLORASEPTIC) mouth spray 1 spray, 1 spray, Mouth/Throat, Q6H PRN, Hongalgi, Anand D, MD .  polyethylene glycol (MIRALAX / GLYCOLAX) packet 17 g, 17 g, Oral, Daily PRN, Hongalgi, Anand D, MD .  QUEtiapine (SEROQUEL) tablet 25 mg, 25 mg, Oral, BID, Dana Allan I, MD, 25 mg at 05/18/20 0834 .  sodium chloride flush (NS) 0.9 % injection 3 mL, 3 mL, Intravenous, Q12H, Hongalgi, Anand D, MD, 3 mL at 05/18/20 0834   Patients Current Diet:     Diet Order                      DIET DYS 3 Room service appropriate? No; Fluid consistency: Thin  Diet effective now                      Precautions / Restrictions Precautions Precautions: Fall Precaution Comments: Contact Precautions. Restrictions Weight Bearing Restrictions: No    Has the patient had 2 or more falls or a fall with injury in the past year? No   Prior Activity Level Limited Community (1-2x/wk): independent with mobility and adls   Prior Functional Level Self Care: Did the patient need help bathing, dressing, using the toilet or eating? Independent   Indoor Mobility: Did the patient need assistance with walking from room to room (with or without device)? Independent   Stairs: Did the patient need assistance with internal or external stairs (with or without device)? Independent   Functional Cognition: Did the patient need help planning regular tasks such as shopping or remembering to take medications? Needed some help   Home Assistive Devices / [place]  Devices/Equipment: None Home Equipment: Walker - 2 wheels,Shower seat,Bedside commode,Wheelchair - manual   Prior Device Use: Indicate devices/aids used  by the patient prior to current illness, exacerbation or injury? None of the above   Current Functional Level Cognition   Arousal/Alertness: Awake/alert Overall Cognitive Status: History of cognitive impairments - at baseline Current Attention Level: Selective Orientation Level: Oriented X4 Following Commands: Follows one step commands inconsistently,Follows one step commands with increased time Safety/Judgement: Decreased awareness of deficits,Decreased awareness of safety General Comments: reported need to go to bathroom, but required step by step commands with minimal follow through noted, significant deficits in safety noted    Extremity Assessment (includes Sensation/Coordination)   Upper Extremity Assessment: Defer to OT evaluation  Lower Extremity Assessment: Generalized weakness     ADLs   Overall ADL's : Needs assistance/impaired Grooming: Wash/dry face,Min guard,Wash/dry hands,Sitting Upper Body Bathing: Minimal assistance,Sitting Upper Body Dressing : Minimal assistance,Moderate assistance,Sitting Toilet Transfer: Maximal assistance,Cueing for safety,Cueing for sequencing,Stand-pivot,BSC Toilet Transfer Details (indicate cue type and reason): LOB x2 to get to [facility], requiring max A in order to safely transfer Toileting- Clothing Manipulation and Hygiene: Total assistance Toileting - Clothing Manipulation Details (indicate cue type and reason): due to lack of balance in standing LOB x1 Functional mobility during ADLs: Moderate assistance General ADL Comments: Pt max A for transfer to [facility], and mod A to return with RW, pt not following commands, short sitting and having LOB x3 when attempting to get to [facility] (sitting back on bed, sitting halfway on commode, and losing balance coming into standing from [facility] to complete peri-care)     Mobility   Overal bed mobility: Needs Assistance Bed Mobility: Sit to Supine Rolling: Min assist Sidelying to sit: Min assist Supine to sit: HOB  elevated,Min assist Sit to supine: Min guard General bed mobility comments: Pt required assistance to move to edge of bed.     Transfers   Overall transfer level: Needs assistance Equipment used: Rolling walker (2 wheeled) Transfers: Squat Pivot Transfers Sit to Stand: Min assist Stand pivot transfers: Min assist Squat pivot transfers: Mod assist General transfer comment: Cues for hand placement to push from seated surface to rise into standing. alittle assist to pivot to 3N1.  Went to bathroom and then needed cues to stand to RW for hand placement and needed steadying once up.     Ambulation / Gait / Stairs / Wheelchair Mobility   Ambulation/Gait Ambulation/Gait assistance: Mod assist,Min assist Gait Distance (Feet): 20 Feet Assistive device: Rolling walker (2 wheeled) Gait Pattern/deviations: Trunk flexed,Step-to pattern,Decreased step length - right,Decreased step length - left,Drifts right/left General Gait Details: Pt required cues for safety and assistance to manage RW position.  Pt with noted weakness but no overt LOB. Fatigues quickly. Gait velocity: decreased Gait velocity interpretation: <1.31 ft/sec, indicative of household ambulator     Posture / Balance Dynamic Sitting Balance Sitting balance - Comments: posterior lean Balance Overall balance assessment: Needs assistance Sitting-balance support: Feet supported,No upper extremity supported Sitting balance-Leahy Scale: Fair Sitting balance - Comments: posterior lean Postural control: Posterior lean Standing balance support: Bilateral upper extremity supported Standing balance-Leahy Scale: Poor Standing balance comment: Assist of 1 for balance with RW     Special needs/care consideration Seizure Precautions Intellectual delay at baseline Visitors approved are Mom accompanied by brother or sister and sister, [name]    Previous Home Environment  Living Arrangements: Parent  Lives With: Family Available Help at  Discharge: Family,Available 24 hours/day Type of Home: [place]  Layout: Two level,Laundry or work area in basement,Able to live on main level with bedroom/bathroom Home Access: Stairs to enter Entrance Stairs-Rails: None Technical brewer of Steps: 1 Bathroom Shower/Tub: Optometrist: Yes How Accessible: Accessible via walker [place]: No Additional Comments: Above information obtained per chart review    Discharge Living Setting Plans for Discharge Living Setting: Patient's home,Lives with (comment) Type of Home at Discharge: House Discharge Home Layout: Two level,Laundry or work area in basement Discharge Home Access: Stairs to enter Entrance Stairs-Rails: None Technical brewer of Steps: 1 Discharge Bathroom Shower/Tub: Designer, fashion/clothing: Standard Discharge Bathroom Accessibility: Yes How Accessible: Accessible via walker Does the patient have any problems obtaining your medications?: No   Social/Family/Support Systems Contact Information: [name], sister is main contact Anticipated Caregiver: mother and sister [name] Ability/Limitations of Caregiver: 24/7 care Caregiver Availability: 24/7 Discharge Plan Discussed with Primary Caregiver: Yes Is Caregiver In Agreement with Plan?: Yes Does Caregiver/Family have Issues with Lodging/Transportation while Pt is in Rehab?: No   Goals Patient/Family Goal for Rehab: Supervision with PT, OT, and SLP Expected length of stay: 10 to 14 days Additional Information: Intellectual delay Pt/Family Agrees to Admission and willing to participate: Yes Program Orientation Provided & Reviewed with Pt/Caregiver Including Roles  & Responsibilities: Yes   Decrease burden of Care through IP rehab admission: n/a   Possible need for SNF placement upon discharge: not anticipated   Patient Condition: I have reviewed medical records from [facility]  , spoken with CM, and patient and family member. I met with patient at the bedside for inpatient rehabilitation assessment.  Patient will benefit from ongoing PT, OT and SLP, can actively participate in 3 hours of therapy a day 5 days of the week, and can make measurable gains during the admission.  Patient will also benefit from the coordinated team approach during an Inpatient Acute Rehabilitation admission.  The patient will receive intensive therapy as well as Rehabilitation physician, nursing, social worker, and care management interventions.  Due to bladder management, bowel management, safety, skin/wound care, disease management, medication administration, pain management and patient education the patient requires 24 hour a day rehabilitation nursing.  The patient is currently mod assist overall with mobility and basic ADLs.  Discharge setting and therapy post discharge at home with home health is anticipated.  Patient has agreed to participate in the Acute Inpatient Rehabilitation Program and will admit today.   Preadmission Screen Completed By:  Cleatrice Burke, 05/18/2020 10:36 AM ______________________________________________________________________   Discussed status with Dr. Naaman Plummer  on  05/18/2020 at  1055 and received approval for admission today.   Admission Coordinator:  Cleatrice Burke, RN, time  1055 Date  05/18/2020    Assessment/Plan: Diagnosis: debility related to sepsis and multiple medical 1. Does the need for close, 24 hr/day Medical supervision in concert with the patient's rehab needs make it unreasonable for this patient to be served in a less intensive setting? Yes 2. Co-Morbidities requiring supervision/potential complications: cognitive delay, seizure disorder, CHF, recent UTI 3. Due to bladder management, bowel management, safety, skin/wound care, disease management, medication administration, pain management and patient education, does the patient require  24 hr/day rehab nursing? Yes 4. Does the patient require coordinated care of a physician, rehab nurse, PT, OT, and SLP to address physical and functional deficits in the context of the above medical diagnosis(es)? Yes Addressing deficits in the following areas: balance, endurance, locomotion, strength, transferring, bowel/bladder control, bathing,  dressing, feeding, grooming, toileting, cognition and psychosocial support 5. Can the patient actively participate in an intensive therapy program of at least 3 hrs of therapy 5 days a week? Yes 6. The potential for patient to make measurable gains while on inpatient rehab is excellent 7. Anticipated functional outcomes upon discharge from inpatient rehab: supervision PT, supervision OT, supervision SLP 8. Estimated rehab length of stay to reach the above functional goals is: 10-14 days 9. Anticipated discharge destination: Home 10. Overall Rehab/Functional Prognosis: excellent     MD Signature: Maria Staggers, MD, [place] [facility] 05/18/2020           Revision History                        Note Details  Author Maria Staggers, MD File Time 05/18/2020 11:05 AM  Author Type Physician Status Signed  Last Editor Maria Staggers, MD Service Physical Medicine and Rehabilitation

## 2020-05-18 NOTE — Progress Notes (Signed)
Inpatient Rehabilitation Medication Review by a Pharmacist  A complete drug regimen review was completed for this patient to identify any potential clinically significant medication issues.  Clinically significant medication issues were identified:  no  Check AMION for pharmacist assigned to patient if future medication questions/issues arise during this admission.  Pharmacist comments:   - Tapering Lorazepam 0.5 mg PO q6h continued for 7 more doses per Neuro plan.  Also on Clonazepam ODT 0.25 mg BID.  - Discharge summary mentions prior Clonazepam regimen on 1 mg TID, and Diazepam 2 mg qhs, but has not been on either of these due to above changes.   -DC summary also mentions Calcium-Vitamin D and Xyzal, but has also been off both meds, okay.  - Depakote 500 mg BID was changed to 500 mg am + 1000 mg qhs due to low level.  Time spent performing this drug regimen review (minutes):  [address]   Maria [name], [place] 05/18/2020 6:50 PM

## 2020-05-18 NOTE — Progress Notes (Signed)
Pt arrived to unit, pt is alert, pt oriented to rehab. Mother at bedside, Explained visitor policy and only one per day.

## 2020-05-19 ENCOUNTER — Inpatient Hospital Stay (HOSPITAL_COMMUNITY): Payer: Medicare Other

## 2020-05-19 ENCOUNTER — Inpatient Hospital Stay (HOSPITAL_COMMUNITY): Payer: Medicare Other | Admitting: Occupational Therapy

## 2020-05-19 DIAGNOSIS — E440 Moderate protein-calorie malnutrition: Secondary | ICD-10-CM

## 2020-05-19 LAB — CBC WITH DIFFERENTIAL/PLATELET
Abs Immature Granulocytes: 0.14 10*3/uL — ABNORMAL HIGH (ref 0.00–0.07)
Basophils Absolute: 0.1 10*3/uL (ref 0.0–0.1)
Basophils Relative: 1 %
Eosinophils Absolute: 0.2 10*3/uL (ref 0.0–0.5)
Eosinophils Relative: 2 %
HCT: 30.6 % — ABNORMAL LOW (ref 36.0–46.0)
Hemoglobin: 9.3 g/dL — ABNORMAL LOW (ref 12.0–15.0)
Immature Granulocytes: 2 %
Lymphocytes Relative: 38 %
Lymphs Abs: 2.7 10*3/uL (ref 0.7–4.0)
MCH: 28.6 pg (ref 26.0–34.0)
MCHC: 30.4 g/dL (ref 30.0–36.0)
MCV: 94.2 fL (ref 80.0–100.0)
Monocytes Absolute: 0.5 10*3/uL (ref 0.1–1.0)
Monocytes Relative: 7 %
Neutro Abs: 3.5 10*3/uL (ref 1.7–7.7)
Neutrophils Relative %: 50 %
Platelets: 318 10*3/uL (ref 150–400)
RBC: 3.25 MIL/uL — ABNORMAL LOW (ref 3.87–5.11)
RDW: 16.3 % — ABNORMAL HIGH (ref 11.5–15.5)
WBC: 7 10*3/uL (ref 4.0–10.5)
nRBC: 0 % (ref 0.0–0.2)

## 2020-05-19 LAB — COMPREHENSIVE METABOLIC PANEL
ALT: 88 U/L — ABNORMAL HIGH (ref 0–44)
AST: 48 U/L — ABNORMAL HIGH (ref 15–41)
Albumin: 2.7 g/dL — ABNORMAL LOW (ref 3.5–5.0)
Alkaline Phosphatase: 127 U/L — ABNORMAL HIGH (ref 38–126)
Anion gap: 10 (ref 5–15)
BUN: 12 mg/dL (ref 6–20)
CO2: 26 mmol/L (ref 22–32)
Calcium: 9.4 mg/dL (ref 8.9–10.3)
Chloride: 102 mmol/L (ref 98–111)
Creatinine, Ser: 0.92 mg/dL (ref 0.44–1.00)
GFR, Estimated: >60 mL/min (ref >60)
Glucose, Bld: 97 mg/dL (ref 70–99)
Potassium: 4 mmol/L (ref 3.5–5.1)
Sodium: 138 mmol/L (ref 135–145)
Total Bilirubin: 0.7 mg/dL (ref 0.3–1.2)
Total Protein: 6.5 g/dL (ref 6.5–8.1)

## 2020-05-19 MED ORDER — LORAZEPAM 0.5 MG PO TABS
0.5000 mg | ORAL_TABLET | Freq: Three times a day (TID) | ORAL | Status: DC
  Administered 2020-05-20 – 2020-06-01 (×37): 0.5 mg via ORAL
  Filled 2020-05-19 (×38): qty 1

## 2020-05-19 NOTE — Progress Notes (Signed)
Inpatient Rehabilitation  Patient information reviewed and entered into eRehab system by Jeydi Klingel M. Geraldine Tesar, M.A., CCC/SLP, PPS Coordinator.  Information including medical coding, functional ability and quality indicators will be reviewed and updated through discharge.    

## 2020-05-19 NOTE — Progress Notes (Signed)
Patient ID: Maria [name], female   DOB: [birth year], 59 y.o.   MRN: [id number]  Pt returning from acute hospital after sepsis being addressed. SW familiar with pt. Please refer to SW assessment completed on 05/08/2020. SW will continue to follow and assess pt for discharge needs.   Loralee Pacas, MSW, [place] Office: [phone] Cell: [phone] Fax: [phone]

## 2020-05-19 NOTE — Plan of Care (Signed)
  Problem: RH Balance Goal: LTG: Patient will maintain dynamic sitting balance (OT) Description: LTG:  Patient will maintain dynamic sitting balance with assistance during activities of daily living (OT) Flowsheets (Taken 05/19/2020 0944) LTG: Pt will maintain dynamic sitting balance during ADLs with: Supervision/Verbal cueing Goal: LTG Patient will maintain dynamic standing with ADLs (OT) Description: LTG:  Patient will maintain dynamic standing balance with assist during activities of daily living (OT)  Flowsheets (Taken 05/19/2020 0944) LTG: Pt will maintain dynamic standing balance during ADLs with: Supervision/Verbal cueing   Problem: Sit to Stand Goal: LTG:  Patient will perform sit to stand in prep for activites of daily living with assistance level (OT) Description: LTG:  Patient will perform sit to stand in prep for activites of daily living with assistance level (OT) Flowsheets (Taken 05/19/2020 0944) LTG: PT will perform sit to stand in prep for activites of daily living with assistance level: Supervision/Verbal cueing   Problem: RH Grooming Goal: LTG Patient will perform grooming w/assist,cues/equip (OT) Description: LTG: Patient will perform grooming with assist, with/without cues using equipment (OT) Flowsheets (Taken 05/19/2020 0944) LTG: Pt will perform grooming with assistance level of: Set up assist    Problem: RH Bathing Goal: LTG Patient will bathe all body parts with assist levels (OT) Description: LTG: Patient will bathe all body parts with assist levels (OT) Flowsheets (Taken 05/19/2020 0944) LTG: Pt will perform bathing with assistance level/cueing: Supervision/Verbal cueing   Problem: RH Dressing Goal: LTG Patient will perform upper body dressing (OT) Description: LTG Patient will perform upper body dressing with assist, with/without cues (OT). Flowsheets (Taken 05/19/2020 0944) LTG: Pt will perform upper body dressing with assistance level of: Set up  assist Goal: LTG Patient will perform lower body dressing w/assist (OT) Description: LTG: Patient will perform lower body dressing with assist, with/without cues in positioning using equipment (OT) Flowsheets (Taken 05/19/2020 0944) LTG: Pt will perform lower body dressing with assistance level of: Supervision/Verbal cueing   Problem: RH Toileting Goal: LTG Patient will perform toileting task (3/3 steps) with assistance level (OT) Description: LTG: Patient will perform toileting task (3/3 steps) with assistance level (OT)  Flowsheets (Taken 05/19/2020 0944) LTG: Pt will perform toileting task (3/3 steps) with assistance level: Supervision/Verbal cueing   Problem: RH Toilet Transfers Goal: LTG Patient will perform toilet transfers w/assist (OT) Description: LTG: Patient will perform toilet transfers with assist, with/without cues using equipment (OT) Flowsheets (Taken 05/19/2020 0944) LTG: Pt will perform toilet transfers with assistance level of: Supervision/Verbal cueing   Problem: RH Tub/Shower Transfers Goal: LTG Patient will perform tub/shower transfers w/assist (OT) Description: LTG: Patient will perform tub/shower transfers with assist, with/without cues using equipment (OT) Flowsheets (Taken 05/19/2020 0944) LTG: Pt will perform tub/shower stall transfers with assistance level of: Supervision/Verbal cueing   Problem: RH Awareness Goal: LTG: Patient will demonstrate awareness during functional activites type of (OT) Description: LTG: Patient will demonstrate awareness during functional activites type of (OT) Flowsheets (Taken 05/19/2020 0944) LTG: Patient will demonstrate awareness during functional activites type of (OT): Modified Independent

## 2020-05-19 NOTE — Evaluation (Signed)
Speech Language Pathology Assessment and Plan  Patient Details  Name: Maria [name] MRN: [id number] Date of Birth: [birth year]  SLP Diagnosis: Cognitive Impairments;Dysphagia  Rehab Potential: Good ELOS: 12-14 days    Today's Date: 05/19/2020 SLP Individual Time: 1300-1400 SLP Individual Time Calculation (min): 60 min   Hospital Problem: Principal Problem:   Debility  Past Medical History:  Past Medical History:  Diagnosis Date  . Constipation   . Dysphagia   . Fracture    R foot  . GERD (gastroesophageal reflux disease)   . History of shingles 10/2016  . Mental retardation    lesion in head  . Seizures ([place])    "not fully controlled on max doses of meds" (Neuro ofc note 12/2014)  . Thrombocytopenia ([place])   . Tremor    Past Surgical History:  Past Surgical History:  Procedure Laterality Date  . BIOPSY  09/16/2014   Procedure: BIOPSY;  Surgeon: Rogene Houston, MD;  Location: [facility];  Service: Endoscopy;;  . CATARACT EXTRACTION     both eyes, May of 2015  . COLONOSCOPY    . COLONOSCOPY WITH PROPOFOL N/A 11/20/2018   Procedure: COLONOSCOPY WITH PROPOFOL;  Surgeon: Rogene Houston, MD;  Location: [facility];  Service: Endoscopy;  Laterality: N/A;  . ESOPHAGEAL DILATION N/A 09/16/2014   Procedure: ESOPHAGEAL DILATION WITH 54FR MALONEY DILATOR;  Surgeon: Rogene Houston, MD;  Location: [facility];  Service: Endoscopy;  Laterality: N/A;  . ESOPHAGOGASTRODUODENOSCOPY (EGD) WITH PROPOFOL N/A 09/16/2014   Procedure: ESOPHAGOGASTRODUODENOSCOPY (EGD) WITH PROPOFOL;  Surgeon: Rogene Houston, MD;  Location: [facility];  Service: Endoscopy;  Laterality: N/A;  . MOUTH SURGERY    . POLYPECTOMY  11/20/2018   Procedure: POLYPECTOMY;  Surgeon: Rogene Houston, MD;  Location: [facility];  Service: Endoscopy;;  colon  . Skin graft to gum Right 08/2013  . TONSILLECTOMY AND ADENOIDECTOMY    . TOTAL ABDOMINAL HYSTERECTOMY      Assessment / Plan / Recommendation Clinical Impression Patient  is a 59 y.o. year old female with recent admission to the hospital on with history of intellectual delay, seizure disorder, chronic diastolic CHF, GERD, chronic constipation who was old originally [facility] 04[facility]14[facility]2020 with septic shock due to E. coli UTI with pyelonephritis. CT of chest showed small abscess in lower pole of left kidney and follow-up ultrasound 11 days later showed stable lesion likely abscess versus renal cyst. Urology did not feel lesion was abscess and she completed 14-day course of Rocephin. Her hospital course was significant for breakthrough seizures with delirium and agitation. Medical issues had resolved but patient was noted to be debilitated and CIR was recommended due to functional decline. She was admitted to CIR on 05/05/2020 and was doing well until 12/08 when she developed fever due to UTI. She was started on fluids as well as antibiotics for treatment of sepsis however onset 12/10 blood cultures revealed gram-negative rods and urine culture showed ESBL E. coli. Antibiotics were changed to IV meropenem per ID input and patient was transferred to acute hospital for closer monitoring.  Repeat CT abdomen pelvis showed prior abscesses had resolved and revealed small new focal low-density midpole left kidney measuring 12 mm with question of small developing abscess. Dr. Claudia Desanctis and IRfelt that new lesion not amiable to percutaneous drainage and to continue antibiotics for now. Patient responding to current regimen and sepsis has resolved. She was transition to [place] on 12/12 withrecommendations to check weekly ESR/CRP and repeat  CT abdomen pelvis on 12/24 to reassess renal abscess and ultimately guide duration of therapy. On early 12/14,patient noted to have focal seizure and neurology was consulted for input. Dr. Cheral Marker recommended continuing current anticonvulsant regimen,treatment of sepsis with seizure precautions--to treat seizures withtaper of  AtivanX 3 days.Valproic acid level less than 10 and felt to be due to significant interaction between meropenem and divalproex-->dose increased 200 mg p.o. in the evening and 500 in a.m with pharmacy to assist in dosing/management of valproic acid. She has had issues with nausea requiring as needed Zofran and Reglan. She is tolerating dysphagia 3 diet as well as activity. Therapy resumed with patient showing ongoing deficits in mobility and ADLs. She was cleared to resume her rehab course.   Patient transferred to CIR on 05/18/2020   Pt presents with moderate cognitive linguistic impairment characterized by decreased memory, decreased sustained attention, decreased orientation, and delayed processing. Pt has poor frustration tolerance during assessment. Pt does have baseline cognitive impairment per H&P, had 24/7 supervision/assist at home. SLP attempting formal cognitive assessment, however patient refusing most prompts stating "I don't want to talk about that anymore". Pt was oriented to age, birthday, MOY and year. Pt able to recall reason for initial hospitalization but unable to detail recent medical events. Pt benefits from cues to increase sustained attention to functional tasks.   Pt also presents with minimal oropharyngeal dysphagia characterized by mild extended mastication of solids and decreased oral transit time. Pt did demonstrate occ. Cough after swallow with thin liquids via cup and straw. Recommend pt continue with current diet at this time with advance diet texture trials as appropriate. Pt will benefit from skilled ST for cognitive linguistic remediation and dysphagia management.     Skilled Therapeutic Interventions          Pt participating in Bedside swallow evaluation and informal assessment of cognitive linguistic function. Please see above for details. Results reviewed with patient.    SLP Assessment  Patient will need skilled Speech Lanaguage Pathology Services during CIR  admission    Recommendations  SLP Diet Recommendations: Dysphagia 3 (Mech soft);Thin Liquid Administration via: Cup;Straw Medication Administration: Whole meds with liquid Supervision: Patient able to self feed;Full supervision/cueing for compensatory strategies Compensations: Minimize environmental distractions;Slow rate;Small sips/bites Postural Changes and/or Swallow Maneuvers: Seated upright 90 degrees Oral Care Recommendations: Oral care BID Recommendations for Other Services: Neuropsych consult Patient destination: Home Follow up Recommendations: Home Health SLP;24 hour supervision/assistance Equipment Recommended: None recommended by SLP    SLP Frequency 3 to 5 out of 7 days   SLP Duration  SLP Intensity  SLP Treatment/Interventions 12-14 days  Minumum of 1-2 x/day, 30 to 90 minutes  Cognitive remediation/compensation;Patient/family education;Internal/external aids;Dysphagia/aspiration precaution training;Therapeutic Activities;Environmental controls;Cueing hierarchy;Functional tasks    Pain Pain Assessment Pain Scale: 0-10 Pain Score: 0-No pain  Prior Functioning Cognitive/Linguistic Baseline: Baseline deficits Baseline deficit details: intellectual Type of Home: House  Lives With: Family Available Help at Discharge: Family;Available 24 hours/day Vocation: Unemployed  SLP Evaluation Cognition Overall Cognitive Status: History of cognitive impairments - at baseline Arousal/Alertness: Awake/alert Orientation Level: Oriented to person;Oriented to place;Oriented to time Attention: Focused;Sustained Focused Attention: Impaired Focused Attention Impairment: Verbal basic;Functional basic Sustained Attention: Impaired Sustained Attention Impairment: Verbal basic;Functional basic Memory: Impaired Memory Impairment: Retrieval deficit;Decreased recall of new information Awareness: Impaired Awareness Impairment: Intellectual impairment Problem Solving:  Impaired Problem Solving Impairment: Verbal basic;Functional basic Executive Function: Reasoning;Decision Making Reasoning: Impaired Reasoning Impairment: Verbal basic;Functional basic Decision Making: Impaired Decision Making Impairment: Verbal  basic;Functional basic Behaviors: Verbal agitation;Poor frustration tolerance Safety/Judgment: Impaired  Comprehension Auditory Comprehension Overall Auditory Comprehension: Appears within functional limits for tasks assessed Conversation: Simple Interfering Components: Processing speed EffectiveTechniques: Extra processing time;Repetition;Slowed speech Visual Recognition/Discrimination Discrimination: Not tested Reading Comprehension Reading Status: Not tested Expression Expression Primary Mode of Expression: Verbal Verbal Expression Overall Verbal Expression: Appears within functional limits for tasks assessed Initiation: No impairment Written Expression Dominant Hand: Right Written Expression: Not tested Oral Motor Oral Motor/Sensory Function Overall Oral Motor/Sensory Function: Within functional limits Motor Speech Overall Motor Speech: Appears within functional limits for tasks assessed  Care Tool Care Tool Cognition Expression of Ideas and Wants Expression of Ideas and Wants: Some difficulty - exhibits some difficulty with expressing needs and ideas (e.g, some words or finishing thoughts) or speech is not clear   Understanding Verbal and Non-Verbal Content Understanding Verbal and Non-Verbal Content: Usually understands - understands most conversations, but misses some part/intent of message. Requires cues at times to understand   Memory/Recall Ability *first 3 days only Memory/Recall Ability *first 3 days only: That he or she is in a hospital/hospital unit    Bedside Swallowing Assessment General Date of Onset: 04/14/20 Previous Swallow Assessment: MBS completed on 11/30. recommending dys 3 ith thin liquids. Temperature  Spikes Noted: No History of Recent Intubation: No Behavior/Cognition: Alert;Agitated;Distractible;Requires cueing Oral Cavity - Dentition: Adequate natural dentition Patient Positioning: Upright in bed Baseline Vocal Quality: Normal Volitional Cough: Strong  Oral Care Assessment Does patient have any of the following "high(er) risk" factors?: None of the above Does patient have any of the following "at risk" factors?: None of the above Ice Chips   Thin Liquid Thin Liquid: Impaired Presentation: Straw;Cup Oral Phase Impairments: Impaired mastication;Reduced lingual movement/coordination Pharyngeal  Phase Impairments: Cough - Immediate Nectar Thick Nectar Thick Liquid: Not tested Honey Thick Honey Thick Liquid: Not tested Puree Puree: Within functional limits Solid Solid: Impaired Oral Phase Impairments: Reduced lingual movement/coordination;Impaired mastication Oral Phase Functional Implications: Impaired mastication BSE Assessment Risk for Aspiration Impact on safety and function: Mild aspiration risk Other Related Risk Factors: Cognitive impairment  Short Term Goals: Week 1: SLP Short Term Goal 1 (Week 1): Pt will tolerate trials of dysphagia 4 consistency and thin liquids with no overt s/sx of aspiration or penetration on 9/10 trials. SLP Short Term Goal 2 (Week 1): Pt will demonstrate adequate safety awareness for hospital situation with 85% accuracy min a verbal cues. SLP Short Term Goal 3 (Week 1): Pt will demonstrate orientation x4 using external aids with min A verbal cues. SLP Short Term Goal 4 (Week 1): Pt will sustain attention to functional tasks for 5-6 minutes with min A verbal cues.  Refer to Care Plan for Long Term Goals  Recommendations for other services: Neuropsych  Discharge Criteria: Patient will be discharged from SLP if patient refuses treatment 3 consecutive times without medical reason, if treatment goals not met, if there is a change in medical  status, if patient makes no progress towards goals or if patient is discharged from hospital.  The above assessment, treatment plan, treatment alternatives and goals were discussed and mutually agreed upon: by patient  Dewaine Conger 05/19/2020, 1:48 PM

## 2020-05-19 NOTE — Progress Notes (Signed)
Pt slept through the night.

## 2020-05-19 NOTE — IPOC Note (Signed)
Overall Plan of Care [facility]) Patient Details Name: Maria [name] MRN: [id number] DOB: 01/14/1961  Admitting Diagnosis: [place] Hospital Problems: Principal Problem:   Debility     Functional Problem List: Nursing Behavior,Nutrition,Bladder,Bowel,Safety,Endurance,Sensory,Medication Management  PT Balance,Behavior,Endurance,Motor,Pain,Perception,Safety,Sensory,Skin Integrity  OT Balance,Behavior,Cognition,Endurance,Motor,Nutrition,Pain,Perception,Safety,Sensory  SLP Cognition  TR         Basic ADLs: OT Grooming,Bathing,Dressing,Toileting     Advanced  ADLs: OT       Transfers: PT Bed Mobility,Bed to [employer]  OT Toilet,Tub/Shower     Locomotion: PT Ambulation,Stairs     Additional Impairments: OT None  SLP Swallowing,Social Cognition   Problem Solving,Memory,Attention,Awareness  TR      Anticipated Outcomes Item Anticipated Outcome  Self Feeding    Swallowing  supervision for safe swallow strategies   Basic self-care  Supervision  Toileting  Supervision   Bathroom Transfers Supervision  Bowel/Bladder  to be continent of B&B  Transfers  supervisoin with LRAD  Locomotion  supervision with LRAD  Communication     Cognition  min A for functional cognition  Pain  less than 3 out of 10  Safety/Judgment  to remain fall free while in rehab   Therapy Plan: PT Intensity: Minimum of 1-2 x/day ,45 to 90 minutes PT Frequency: 5 out of 7 days PT Duration Estimated Length of Stay: ~2.5 weeks OT Intensity: Minimum of 1-2 x/day, 45 to 90 minutes OT Frequency: 5 out of 7 days OT Duration/Estimated Length of Stay: 12-14 days SLP Intensity: Minumum of 1-2 x/day, 30 to 90 minutes SLP Frequency: 3 to 5 out of 7 days SLP Duration/Estimated Length of Stay: 12-14 days   Due to the current state of emergency, patients may not be receiving their 3-hours of Medicare-mandated therapy.   Team Interventions: Nursing Interventions Patient/Family  Education,Disease Management/Prevention,Discharge Planning,Bladder Management,Cognitive Remediation/Compensation,Psychosocial Support,Bowel Management,Medication Environmental consultant  PT interventions Community reintegration,Ambulation/gait training,DME/adaptive equipment instruction,Neuromuscular re-education,Psychosocial support,Stair training,UE/LE Strength taining/ROM,Balance/vestibular training,Discharge planning,Functional electrical stimulation,Pain management,Skin care/wound management,Therapeutic Activities,UE/LE Coordination activities,Cognitive remediation/compensation,Disease management/prevention,Functional mobility training,Patient/family education,Splinting/orthotics,Therapeutic Exercise,Visual/perceptual remediation/compensation  OT Interventions Balance/vestibular training,Cognitive remediation/compensation,Community reintegration,Discharge planning,Disease Public affairs consultant stimulation,Functional mobility training,Patient/family education,Neuromuscular re-education,Pain management,Skin care/wound managment,Psychosocial support,Self Care/advanced ADL retraining,Wheelchair propulsion/positioning,Visual/perceptual remediation/compensation,UE/LE Coordination activities,UE/LE Strength taining/ROM,Therapeutic Exercise,Therapeutic Activities,Splinting/orthotics  SLP Interventions Cognitive remediation/compensation,Patient/family education,Internal/external aids,Dysphagia/aspiration precaution training,Therapeutic Activities,Environmental controls,Cueing hierarchy,Functional tasks  TR Interventions    SW/CM Interventions     Barriers to Discharge MD  Medical stability  Nursing IV antibiotics    PT Incontinence,Decreased caregiver support,Behavior    OT      SLP      SW       Team Discharge Planning: Destination: PT-Home ,OT- Home , SLP-Home Projected Follow-up: PT-Home health PT,24 hour  supervision/assistance, OT-  Home health OT, SLP-Home Health SLP,24 hour supervision/assistance Projected Equipment Needs: PT-To be determined, OT- To be determined, SLP-None recommended by SLP Equipment Details: PT- , OT-  Patient/family involved in discharge planning: PT- Patient,  OT-Patient, SLP-Patient unable/family or caregive not available  MD ELOS: 13-16 days Medical Rehab Prognosis:  Excellent Assessment: The patient has been admitted for CIR therapies with the diagnosis of debility after multiple medical, course complicated by significant bacterial urinary tract infection. The team will be addressing functional mobility, strength, stamina, balance, safety, adaptive techniques and equipment, self-care, bowel and bladder mgt, patient and caregiver education, pain mgt, cognition, communication, community reentry. Goals have been set at supervision   with self-care, supervision with mobility and min assist to with swallowing and cognition as she has baseline cognitive deficots.   Due to  the current state of emergency, patients may not be receiving their 3 hours per day of Medicare-mandated therapy.    Maria Staggers, MD, FAAPMR      See Team Conference Notes for weekly updates to the plan of care

## 2020-05-19 NOTE — Evaluation (Signed)
Physical Therapy Assessment and Plan  Patient Details  Name: Maria [name] MRN: [id number] Date of Birth: Oct 29, 1960  PT Diagnosis: Difficulty walking and Muscle weakness Rehab Potential: Good ELOS: ~2.5 weeks   Today's Date: 05/19/2020 PT Individual Time: 1103-1202 PT Individual Time Calculation (min): 59 min    Hospital Problem: Principal Problem:   Debility   Past Medical History:  Past Medical History:  Diagnosis Date   Constipation    Dysphagia    Fracture    R foot   GERD (gastroesophageal reflux disease)    History of shingles 10/2016   Mental retardation    lesion in head   Seizures ([place])    "not fully controlled on max doses of meds" (Neuro ofc note 12/2014)   Thrombocytopenia ([place])    Tremor    Past Surgical History:  Past Surgical History:  Procedure Laterality Date   BIOPSY  09/16/2014   Procedure: BIOPSY;  Surgeon: Rogene Houston, MD;  Location: [facility];  Service: Endoscopy;;   CATARACT EXTRACTION     both eyes, May of 2015   COLONOSCOPY     COLONOSCOPY WITH PROPOFOL N/A 11/20/2018   Procedure: COLONOSCOPY WITH PROPOFOL;  Surgeon: Rogene Houston, MD;  Location: [facility];  Service: Endoscopy;  Laterality: N/A;   ESOPHAGEAL DILATION N/A 09/16/2014   Procedure: ESOPHAGEAL DILATION WITH 54FR MALONEY DILATOR;  Surgeon: Rogene Houston, MD;  Location: [facility];  Service: Endoscopy;  Laterality: N/A;   ESOPHAGOGASTRODUODENOSCOPY (EGD) WITH PROPOFOL N/A 09/16/2014   Procedure: ESOPHAGOGASTRODUODENOSCOPY (EGD) WITH PROPOFOL;  Surgeon: Rogene Houston, MD;  Location: [facility];  Service: Endoscopy;  Laterality: N/A;   MOUTH SURGERY     POLYPECTOMY  11/20/2018   Procedure: POLYPECTOMY;  Surgeon: Rogene Houston, MD;  Location: [facility];  Service: Endoscopy;;  colon   Skin graft to gum Right 08/2013   TONSILLECTOMY AND ADENOIDECTOMY     TOTAL ABDOMINAL HYSTERECTOMY      Assessment & Plan Clinical Impression: Patient is a 59 year old  femalewith history of intellectual delay, seizure disorder, chronic diastolic CHF, GERD, chronic constipation who was old [facility]2020 with septic shock due to E. coli UTI with pyelonephritis. CT of chest showed small abscess in lower pole of left kidney and follow-up ultrasound 11 days later showed stable lesion likely abscess versus renal cyst. Urology did not feel lesion was abscess and she completed 14-day course of Rocephin. Her hospital course was significant for breakthrough seizures with delirium and agitation. Medical issues had resolved but patient was noted to be debilitated and CIR was recommended due to functional decline. She was admitted to CIR on 05/05/2020 and was doing well until 12/08 when she developed fever due to UTI. She was started on fluids as well as antibiotics for treatment of sepsis however onset 12/10 blood cultures revealed gram-negative rods and urine culture showed ESBL E. coli. Antibiotics were changed to IV meropenem per ID input and patient was transferred to acute hospital for closer monitoring.  Repeat CT abdomen pelvis showed prior abscesses had resolved and revealed small new focal low-density midpole left kidney measuring 12 mm with question of small developing abscess. Dr. Claudia Desanctis and IRfelt that new lesion not amiable to percutaneous drainage and to continue antibiotics for now. Patient responding to current regimen and sepsis has resolved. She was transition to [place] on 12/12 withrecommendations to check weekly ESR/CRP and repeat CT abdomen pelvis on 12/24 to reassess renal abscess  and ultimately guide duration of therapy. On early 12/14,patient noted to have focal seizure and neurology was consulted for input. Dr. Cheral Marker recommended continuing current anticonvulsant regimen,treatment of sepsis with seizure precautions--to treat seizures withtaper of AtivanX 3 days.Valproic acid level less than 10 and felt  to be due to significant interaction between meropenem and divalproex-->dose increased 200 mg p.o. in the evening and 500 in a.m with pharmacy to assist in dosing/management of valproic acid. She has had issues with nausea requiring as needed Zofran and Reglan. She is tolerating dysphagia 3 diet as well as activity.   Patient transferred to CIR on 05/18/2020 .   Patient currently requires mod with mobility secondary to muscle weakness, decreased cardiorespiratoy endurance, decreased initiation, decreased attention, decreased awareness, decreased problem solving, decreased safety awareness and decreased memory and decreased sitting balance, decreased standing balance, decreased postural control and decreased balance strategies.  Prior to hospitalization, patient was modified independent  with mobility and lived with Family in a House home.  Home access is 1Stairs to enter.  Patient will benefit from skilled PT intervention to maximize safe functional mobility, minimize fall risk and decrease caregiver burden for planned discharge home with 24 hour supervision.  Anticipate patient will benefit from follow up [place] at discharge.  PT - End of Session Activity Tolerance: Tolerates 30+ min activity with multiple rests Endurance Deficit: Yes Endurance Deficit Description: multiple rest breaks durin mobility tasks PT Assessment Rehab Potential (ACUTE/IP ONLY): Good PT Barriers to Discharge: Incontinence;Decreased caregiver support;Behavior PT Patient demonstrates impairments in the following area(s): Balance;Behavior;Endurance;Motor;Pain;Perception;Safety;Sensory;Skin Integrity PT Transfers Functional Problem(s): Bed Mobility;Bed to Chair;Car;Furniture PT Locomotion Functional Problem(s): Ambulation;Stairs PT Plan PT Intensity: Minimum of 1-2 x/day ,45 to 90 minutes PT Frequency: 5 out of 7 days PT Duration Estimated Length of Stay: ~2.5 weeks PT Treatment/Interventions: Community  reintegration;Ambulation/gait training;DME/adaptive equipment instruction;Neuromuscular re-education;Psychosocial support;Stair training;UE/LE Strength taining/ROM;Balance/vestibular training;Discharge planning;Functional electrical stimulation;Pain management;Skin care/wound management;Therapeutic Activities;UE/LE Coordination activities;Cognitive remediation/compensation;Disease management/prevention;Functional mobility training;Patient/family education;Splinting/orthotics;Therapeutic Exercise;Visual/perceptual remediation/compensation PT Transfers Anticipated Outcome(s): supervisoin with LRAD PT Locomotion Anticipated Outcome(s): supervision with LRAD PT Recommendation Follow Up Recommendations: Home health PT;24 hour supervision/assistance Patient destination: Home Equipment Recommended: To be determined   PT Evaluation Precautions/Restrictions Precautions Precautions: Fall Restrictions Weight Bearing Restrictions: No Pain Pain Assessment Pain Scale: 0-10 Pain Score: 0-No pain Home Living/Prior Functioning Home Living Available Help at Discharge: Family;Available 24 hours/day Type of Home: House Home Access: Stairs to enter [employer] of Steps: 1 Entrance Stairs-Rails: None Home Layout: Two level;Laundry or work area in basement;Able to live on main level with bedroom/bathroom Bathroom Shower/Tub: Chiropodist: Programmer, systems: Yes  Lives With: Family Prior Function Level of Independence: Independent with basic ADLs;Needs assistance with homemaking Comments: supervision for safety, cognition, medication; pt's mother reports pt would help with light household tasks such as washing dishes or vacuuming; pt's mother reports pt hasn't had a seizure in 3 years; pt/brother report pt is very sedentary at baseline sleeping a lot during the day and otherwise sitting and watching TV Vision/Perception  Perception Perception:  Impaired Praxis Praxis: Impaired Praxis Impairment Details: Motor planning  Cognition Overall Cognitive Status: History of cognitive impairments - at baseline Arousal/Alertness: Awake/alert Orientation Level: Oriented X4 Focused Attention: Impaired Sustained Attention Impairment: Verbal basic;Functional basic Memory: Impaired Memory Impairment: Retrieval deficit;Decreased recall of new information Immediate Memory Recall: Sock;Blue;Bed Memory Recall Sock: With Cue Memory Recall Blue: With Cue Memory Recall Bed: Not able to recall Executive Function: Reasoning;Decision Making Safety/Judgment: Impaired Sensation Sensation Light Touch Impaired  Details: Impaired RLE;Impaired LLE Coordination Gross Motor Movements are Fluid and Coordinated: No Fine Motor Movements are Fluid and Coordinated: No Coordination and Movement Description: decreased smoothness and accuracy 2/2 weakness Motor  Motor Motor - Skilled Clinical Observations: generalized weakness   Trunk/Postural Assessment  Cervical Assessment Cervical Assessment: Exceptions to [facility] (forward head) Thoracic Assessment Thoracic Assessment: Exceptions to [facility] (rounded shoulders) Lumbar Assessment Lumbar Assessment: Exceptions to [facility] (posterior pelvic tilt) Postural Control Postural Control: Deficits on evaluation Trunk Control: slight posterior trunk lean Righting Reactions: delayed and inadequate Protective Responses: delayed  Balance Static Sitting Balance Static Sitting - Level of Assistance: 5: Stand by assistance Dynamic Sitting Balance Dynamic Sitting - Level of Assistance: 4: Min assist Static Standing Balance Static Standing - Level of Assistance: 4: Min assist Dynamic Standing Balance Dynamic Standing - Level of Assistance: 3: Mod assist Extremity Assessment  RUE Assessment RUE Assessment: Exceptions to [facility] General Strength Comments: 3+5 overall, generalized weakness and difficulty following commands for  accurate MMT LUE Assessment LUE Assessment: Exceptions to [facility] General Strength Comments: 3+5 overall, generalized weakness and difficulty following commands for accurate MMT RLE Assessment RLE Assessment: Exceptions to [facility] Active Range of Motion (AROM) Comments: WFL General Strength Comments: grossly 4/5 throughout demonstrated functionally due to impaired command following and limited participation preventing ability to perform formal assessment LLE Assessment LLE Assessment: Exceptions to [facility] Active Range of Motion (AROM) Comments: WFL General Strength Comments: grossly 4/5 throughout demonstrated functionally due to impaired command following and limited participation preventing ability to perform formal assessment  Care Tool Care Tool Bed Mobility Roll left and right activity   Roll left and right assist level: Moderate Assistance - Patient 50 - 74%    Sit to lying activity   Sit to lying assist level: Moderate Assistance - Patient 50 - 74%    Lying to sitting edge of bed activity   Lying to sitting edge of bed assist level: Moderate Assistance - Patient 50 - 74%     Care Tool Transfers Sit to stand transfer   Sit to stand assist level: Moderate Assistance - Patient 50 - 74%    Chair/bed transfer   Chair/bed transfer assist level: Moderate Assistance - Patient 50 - 74%     Toilet transfer   Assist Level: Moderate Assistance - Patient 50 - 74%    Car transfer   Car transfer assist level: Moderate Assistance - Patient 50 - 74%      Care Tool Locomotion Ambulation   Assist level: Moderate Assistance - Patient 50 - 74% Assistive device: Hand held assist Max distance: 100'  Walk 10 feet activity   Assist level: Moderate Assistance - Patient - 50 - 74% Assistive device: Hand held assist   Walk 50 feet with 2 turns activity   Assist level: Moderate Assistance - Patient - 50 - 74% Assistive device: Hand held assist  Walk 150 feet activity Walk 150 feet activity did not  occur: Safety/medical concerns      Walk 10 feet on uneven surfaces activity Walk 10 feet on uneven surfaces activity did not occur: Safety/medical concerns      Stairs Stair activity did not occur: Safety/medical concerns        Walk up/down 1 step activity Walk up/down 1 step or curb (drop down) activity did not occur: Safety/medical concerns     Walk up/down 4 steps activity did not occuR: Safety/medical concerns  Walk up/down 4 steps activity      Walk up/down 12 steps  activity Walk up/down 12 steps activity did not occur: Safety/medical concerns      Pick up small objects from floor Pick up small object from the floor (from standing position) activity did not occur: Safety/medical concerns      Wheelchair Will patient use wheelchair at discharge?: No          Wheel 50 feet with 2 turns activity      Wheel 150 feet activity        Refer to Care Plan for Long Term Goals  SHORT TERM GOAL WEEK 1 PT Short Term Goal 1 (Week 1): Pt will perform supine<>sit with min assist PT Short Term Goal 2 (Week 1): Pt will perform sit<>stands using LRAD with min assist PT Short Term Goal 3 (Week 1): Pt will perform bed<>chair transfers using LRAD with min assist PT Short Term Goal 4 (Week 1): Pt will ambulate at least 43ft using LRAD with min assist PT Short Term Goal 5 (Week 1): Pt will ascend/descend at least 1 step using LRAD with mod assist  Recommendations for other services: None   Skilled Therapeutic Intervention  Evaluation completed (see details above and below) with education on PT POC and goals and individual treatment initiated with focus on bed mobility, balance, transfers, and ambulation.  Pt received seated in WC. Pt initially requesting to return to bed but PT able to use gentle persuasion to redirect pt and eventually she is agreeable to therapy. WC transport to gym for time management. Pt performs car transfer with HHA modA and cues for sequencing and positioning.  Pt ambulates 100' with modA HHA. PT provides multimodal cues for increased stride length, increased lateral weight shifting, and upright gaze to improve posture and balance. Stand pivot transfer to mat table with modA. Pt requesting repeatedly to return to room and becoming irritable. PT provides modA for pt to perform sit to supine and provides pt with several blankets and a pillow to allow pt to rest for several minutes in supine on mat table. While pt rests PT adjusts pt's roho cushion for optimal fit and air pressure. Pt calmed down and PT provides modA for return to sitting. Pt then ambulates back to room, ~250', with modA HHA. Pt left supine in bed with alarm intact and all needs within reach.   Mobility Bed Mobility Supine to Sit: Moderate Assistance - Patient 50-74% Transfers Transfers: Sit to Stand;Stand to Sit;Squat Pivot Transfers Sit to Stand: Moderate Assistance - Patient 50-74%;Maximal Assistance - Patient 25-49% Stand to Sit: Moderate Assistance - Patient 50-74%;Maximal Assistance - Patient 25-49% Stand Pivot Transfers: Moderate Assistance - Patient 50 - 74% Locomotion  Gait Ambulation: Yes Gait Assistance: Moderate Assistance - Patient 50-74% Gait Distance (Feet): 250 Feet Assistive device: 1 person hand held assist Gait Assistance Details: Verbal cues for technique;Verbal cues for gait pattern;Verbal cues for sequencing;Manual facilitation for weight shifting;Tactile cues for weight shifting;Tactile cues for sequencing;Tactile cues for initiation;Tactile cues for posture Gait Gait: Yes Gait Pattern: Impaired Gait Pattern: Poor foot clearance - left;Poor foot clearance - right;Decreased step length - left;Decreased step length - right Gait velocity: decreased Stairs / Additional Locomotion Stairs: No Wheelchair Mobility Wheelchair Mobility: No   Discharge Criteria: Patient will be discharged from PT if patient refuses treatment 3 consecutive times without medical reason,  if treatment goals not met, if there is a change in medical status, if patient makes no progress towards goals or if patient is discharged from hospital.  The above assessment, treatment plan, treatment  alternatives and goals were discussed and mutually agreed upon: by patient  Breck Coons, PT, DPT 05/19/2020, 12:24 PM

## 2020-05-19 NOTE — Evaluation (Signed)
Occupational Therapy Assessment and Plan  Patient Details  Name: Maria [name] MRN: [id number] Date of Birth: Oct 19, 1960  OT Diagnosis: cognitive deficits and muscle weakness (generalized) Rehab Potential: Rehab Potential (ACUTE ONLY): Good ELOS: 12-14 days   Today's Date: 05/19/2020 OT Individual Time: 3710-6269 OT Individual Time Calculation (min): 58 min     Hospital Problem: Principal Problem:   Debility   Past Medical History:  Past Medical History:  Diagnosis Date  . Constipation   . Dysphagia   . Fracture    R foot  . GERD (gastroesophageal reflux disease)   . History of shingles 10/2016  . Mental retardation    lesion in head  . Seizures ([place])    "not fully controlled on max doses of meds" (Neuro ofc note 12/2014)  . Thrombocytopenia ([place])   . Tremor    Past Surgical History:  Past Surgical History:  Procedure Laterality Date  . BIOPSY  09/16/2014   Procedure: BIOPSY;  Surgeon: Rogene Houston, MD;  Location: [facility];  Service: Endoscopy;;  . CATARACT EXTRACTION     both eyes, May of 2015  . COLONOSCOPY    . COLONOSCOPY WITH PROPOFOL N/A 11/20/2018   Procedure: COLONOSCOPY WITH PROPOFOL;  Surgeon: Rogene Houston, MD;  Location: [facility];  Service: Endoscopy;  Laterality: N/A;  . ESOPHAGEAL DILATION N/A 09/16/2014   Procedure: ESOPHAGEAL DILATION WITH 54FR MALONEY DILATOR;  Surgeon: Rogene Houston, MD;  Location: [facility];  Service: Endoscopy;  Laterality: N/A;  . ESOPHAGOGASTRODUODENOSCOPY (EGD) WITH PROPOFOL N/A 09/16/2014   Procedure: ESOPHAGOGASTRODUODENOSCOPY (EGD) WITH PROPOFOL;  Surgeon: Rogene Houston, MD;  Location: [facility];  Service: Endoscopy;  Laterality: N/A;  . MOUTH SURGERY    . POLYPECTOMY  11/20/2018   Procedure: POLYPECTOMY;  Surgeon: Rogene Houston, MD;  Location: [facility];  Service: Endoscopy;;  colon  . Skin graft to gum Right 08/2013  . TONSILLECTOMY AND ADENOIDECTOMY    . TOTAL ABDOMINAL HYSTERECTOMY      Assessment &  Plan Clinical Impression: Patient is a 59 y.o. year old female with recent admission to the hospital on with history of intellectual delay, seizure disorder, chronic diastolic CHF, GERD, chronic constipation who was old originally admitted via [facility] on 04/14/2020 with septic shock due to E. coli UTI with pyelonephritis.  CT of chest showed small abscess in lower pole of left kidney and follow-up ultrasound 11 days later showed stable lesion likely abscess versus renal cyst.  Urology did not feel lesion was abscess and she completed 14-day course of Rocephin.   Her hospital course was significant for breakthrough seizures with delirium and agitation.  Medical issues had resolved but patient was noted to be debilitated and CIR was recommended due to functional decline.  She was admitted to CIR on 05/05/2020 and was doing well until 12/08 when she developed fever due to UTI.  She was started on fluids as well as antibiotics for treatment of sepsis however onset 12/10 blood cultures revealed gram-negative rods and urine culture showed ESBL E. coli.  Antibiotics were changed to IV meropenem per ID input and patient was transferred to acute hospital for closer monitoring.   Repeat CT abdomen pelvis showed prior abscesses had resolved and revealed small new focal low-density midpole left kidney measuring 12 mm with question of small developing abscess.  Dr. Claudia Desanctis and IR felt that new lesion not amiable to percutaneous drainage and to continue antibiotics for now.  Patient responding to current regimen  and sepsis has resolved.  She was transition to [place] on 12/12 with recommendations to check weekly ESR/CRP and repeat CT abdomen pelvis on 12/24 to reassess renal abscess and ultimately guide duration of therapy.  On early 12/14, patient noted to have focal seizure and neurology was consulted for input.  Dr. Cheral Marker recommended continuing current anticonvulsant regimen, treatment of sepsis with seizure  precautions--to treat seizures with taper of Ativan X 3 days.  Valproic acid level less than 10 and felt to be due to significant interaction between meropenem and divalproex--> dose increased 200 mg p.o. in the evening and 500 in a.m with pharmacy to assist in dosing/management of  valproic acid.  She has had issues with nausea requiring as needed Zofran and Reglan.  She is tolerating dysphagia 3 diet as well as activity.  Therapy resumed with patient showing ongoing deficits in mobility and ADLs.  She was cleared to resume her rehab course.   Patient transferred to CIR on 05/18/2020 .    Patient currently requires mod with basic self-care skills secondary to muscle weakness, decreased cardiorespiratoy endurance, decreased coordination, decreased initiation, decreased attention, decreased awareness, decreased problem solving, decreased safety awareness, decreased memory and delayed processing and decreased sitting balance, decreased standing balance, decreased postural control and decreased balance strategies.  Prior to hospitalization, patient could complete BADL with supervision.  Patient will benefit from skilled intervention to increase independence with basic self-care skills prior to discharge home with care partner.  Anticipate patient will require 24 hour supervision and follow up home health.  OT - End of Session Endurance Deficit: Yes Endurance Deficit Description: rest breaks within BADL tasks OT Assessment Rehab Potential (ACUTE ONLY): Good OT Patient demonstrates impairments in the following area(s): Balance;Behavior;Cognition;Endurance;Motor;Nutrition;Pain;Perception;Safety;Sensory OT Basic ADL's Functional Problem(s): Grooming;Bathing;Dressing;Toileting OT Transfers Functional Problem(s): Toilet;Tub/Shower OT Additional Impairment(s): None OT Plan OT Intensity: Minimum of 1-2 x/day, 45 to 90 minutes OT Frequency: 5 out of 7 days OT Duration/Estimated Length of Stay: 12-14 days OT  Treatment/Interventions: Balance/vestibular training;Cognitive remediation/compensation;Community reintegration;Discharge planning;Disease mangement/prevention;DME/adaptive equipment instruction;Functional electrical stimulation;Functional mobility training;Patient/family education;Neuromuscular re-education;Pain management;Skin care/wound managment;Psychosocial support;Self Care/advanced ADL retraining;Wheelchair propulsion/positioning;Visual/perceptual remediation/compensation;UE/LE Coordination activities;UE/LE Strength taining/ROM;Therapeutic Exercise;Therapeutic Activities;Splinting/orthotics OT Basic Self-Care Anticipated Outcome(s): Supervision OT Toileting Anticipated Outcome(s): Supervision OT Bathroom Transfers Anticipated Outcome(s): Supervision OT Recommendation Recommendations for Other Services: Neuropsych consult Patient destination: Home Follow Up Recommendations: Home health OT Equipment Recommended: To be determined    OT Evaluation Precautions/Restrictions  Precautions Precautions: Fall Restrictions Weight Bearing Restrictions: No Pain  denies pain Home Living/Prior Functioning Home Living Family/patient expects to be discharged to:: Private residence Living Arrangements: Parent Available Help at Discharge: Family,Available 24 hours/day Type of Home: House Home Access: Stairs to enter [employer] of Steps: 1 Entrance Stairs-Rails: None Home Layout: Two level,Laundry or work area in basement,Able to live on main level with bedroom/bathroom Bathroom Shower/Tub: Chiropodist: Programmer, systems: Yes  Lives With: Family IADL History IADL Comments: Patient likes to go on cruises, likes to watch TV, loves [employer] Prior Function Level of Independence: Independent with basic ADLs,Needs assistance with homemaking Comments: supervision for safety, cognition, medication; pt's mother reports pt would help with light household tasks  such as washing dishes or vacuuming; pt's mother reports pt hasn't had a seizure in 3 years; pt/brother report pt is very sedentary at baseline sleeping a lot during the day and otherwise sitting and watching TV Vision Patient Visual Report: No change from baseline Cognition Overall Cognitive Status: History of cognitive impairments - at baseline  Arousal/Alertness: Awake/alert Orientation Level: Person Year: 2021 Month: December Day of Week: Incorrect Memory: Impaired Memory Impairment: Retrieval deficit;Decreased recall of new information Immediate Memory Recall: Sock;Blue;Bed Memory Recall Sock: With Cue Memory Recall Blue: With Cue Memory Recall Bed: Not able to recall Focused Attention: Impaired Sustained Attention Impairment: Verbal basic;Functional basic Executive Function: Reasoning;Decision Making Safety/Judgment: Impaired Sensation Sensation Light Touch Impaired Details: Impaired RLE;Impaired LLE Coordination Gross Motor Movements are Fluid and Coordinated: No Fine Motor Movements are Fluid and Coordinated: No Coordination and Movement Description: decreased smoothness and accuracy 2/2 weakness Motor  Motor Motor - Skilled Clinical Observations: generalized weakness   Balance Static Sitting Balance Static Sitting - Level of Assistance: 5: Stand by assistance Dynamic Sitting Balance Dynamic Sitting - Level of Assistance: 4: Min assist Static Standing Balance Static Standing - Level of Assistance: 4: Min assist Dynamic Standing Balance Dynamic Standing - Level of Assistance: 3: Mod assist Extremity/Trunk Assessment RUE Assessment RUE Assessment: Exceptions to [facility] General Strength Comments: 3+5 overall, generalized weakness and difficulty following commands for accurate MMT LUE Assessment LUE Assessment: Exceptions to [facility] General Strength Comments: 3+5 overall, generalized weakness and difficulty following commands for accurate MMT  Care Tool Care Tool Self  Care Eating   Eating Assist Level: Supervision/Verbal cueing    Oral Care    Oral Care Assist Level: Minimal Assistance - Patient > 75%    Bathing   Body parts bathed by patient: Right arm;Left arm;Chest;Abdomen;Front perineal area;Buttocks;Right upper leg;Left upper leg;Face Body parts bathed by helper: Right lower leg;Left lower leg   Assist Level: Moderate Assistance - Patient 50 - 74%    Upper Body Dressing(including orthotics)   What is the patient wearing?: Pull over shirt   Assist Level: Minimal Assistance - Patient > 75%    Lower Body Dressing (excluding footwear)   What is the patient wearing?: Pants Assist for lower body dressing: Moderate Assistance - Patient 50 - 74%    Putting on/Taking off footwear   What is the patient wearing?: Non-skid slipper socks Assist for footwear: Moderate Assistance - Patient 50 - 74%       Care Tool Toileting Toileting activity   Assist for toileting: Moderate Assistance - Patient 50 - 74%     Care Tool Bed Mobility Roll left and right activity        Sit to lying activity        Lying to sitting edge of bed activity         Care Tool Transfers Sit to stand transfer   Sit to stand assist level: Moderate Assistance - Patient 50 - 74%    Chair/bed transfer   Chair/bed transfer assist level: Moderate Assistance - Patient 50 - 74%     Toilet transfer   Assist Level: Moderate Assistance - Patient 50 - 74%     Care Tool Cognition Expression of Ideas and Wants Expression of Ideas and Wants: Some difficulty - exhibits some difficulty with expressing needs and ideas (e.g, some words or finishing thoughts) or speech is not clear   Understanding Verbal and Non-Verbal Content Understanding Verbal and Non-Verbal Content: Usually understands - understands most conversations, but misses some part/intent of message. Requires cues at times to understand   Memory/Recall Ability *first 3 days only Memory/Recall Ability *first 3 days  only: That he or she is in a hospital/hospital unit;Location of own room;Current season    Refer to Care Plan for [place] 1 OT Short Term  Goal 1 (Week 1): Patient will complete shower transfer with min A OT Short Term Goal 2 (Week 1): Pt will complete 2/3 toileting steps OT Short Term Goal 3 (Week 1): Pt will maintain standing balance with BADL tasks with min A  Recommendations for other services: Neuropsych   Skilled Therapeutic Intervention Patient greeted semi-reclined in bed, awake and alert. Patient initially did not want to get OOB and work with OT without her mother there. After talking to patient and building some rapport, she was agreeable to get OOB and go to the bathroom. Min/mod A bed bed mobility, then completed stand-pivot transfers with mod A overall. Stand-pivot to commode with use of grab bars and verbal cues for technique. OT assist for clothing management, pt was then able to complete her own peri-care. Pt completed some LB bathing seated on toilet. UB bathing and dressing completed from wc at the sink. Pt ate her breakfast seated in wc and agreeable to stay in wc with alarm belt on, call bell in reach, and needs met.   ADL ADL Eating: Set up Grooming: Minimal assistance Upper Body Bathing: Minimal assistance Lower Body Bathing: Moderate assistance Upper Body Dressing: Minimal assistance Lower Body Dressing: Moderate assistance Toileting: Moderate assistance Toilet Transfer: Moderate assistance Mobility  Bed Mobility Supine to Sit: Moderate Assistance - Patient 50-74% Transfers Sit to Stand: Moderate Assistance - Patient 50-74%;Maximal Assistance - Patient 25-49% Stand to Sit: Moderate Assistance - Patient 50-74%;Maximal Assistance - Patient 25-49%   Discharge Criteria: Patient will be discharged from OT if patient refuses treatment 3 consecutive times without medical reason, if treatment goals not met, if there is a change in medical  status, if patient makes no progress towards goals or if patient is discharged from hospital.  The above assessment, treatment plan, treatment alternatives and goals were discussed and mutually agreed upon: by patient  Valma Cava 05/19/2020, 9:59 AM

## 2020-05-19 NOTE — Progress Notes (Signed)
[facility] PHYSICAL MEDICINE & REHABILITATION PROGRESS NOTE   Subjective/Complaints: Up with therapy. Actually said that she was doing "ok". Belly is not bothering her but she hadn't eaten much yet. Denied nausea. Able to sleep somewhat last night. Asked if she was [place] get to go the [place] concert on Saturday.  ROS: Limited due to cognitive/behavioral   Objective:   No results found. Recent Labs    05/18/20 0018 05/19/20 0457  WBC 9.1 7.0  HGB 9.3* 9.3*  HCT 28.9* 30.6*  PLT 274 318   Recent Labs    05/18/20 0018 05/19/20 0457  NA 139 138  K 3.9 4.0  CL 103 102  CO2 25 26  GLUCOSE 102* 97  BUN 13 12  CREATININE 0.75 0.92  CALCIUM 9.2 9.4    Intake/Output Summary (Last 24 hours) at 05/19/2020 1055 Last data filed at 05/19/2020 0631 Gross per 24 hour  Intake 200 ml  Output 400 ml  Net -200 ml     Pressure Injury 04/22/20 Back Medial;Right Stage 2 -  Partial thickness loss of dermis presenting as a shallow open injury with a red, pink wound bed without slough. Medical device pressure injury-shape of lopez valve (Active)  04/22/20 0800  Location: Back  Location Orientation: Medial;Right  Staging: Stage 2 -  Partial thickness loss of dermis presenting as a shallow open injury with a red, pink wound bed without slough.  Wound Description (Comments): Medical device pressure injury-shape of lopez valve  Present on Admission: No    Physical Exam: Vital Signs Blood pressure 113/68, pulse 72, temperature (!) 97.4 F (36.3 C), temperature source Oral, resp. rate 17, height 5\' 6"  (1.676 m), weight 59.4 kg, SpO2 97 %.  General: Alert and oriented to person, place, No apparent distress. Frail appearing.  HEENT: Head is normocephalic, atraumatic, PERRLA, EOMI, sclera anicteric, oral mucosa pink and moist, dentition intact, ext ear canals clear,  Neck: Supple without JVD or lymphadenopathy Heart: Reg rate and rhythm. No murmurs rubs or gallops Chest: CTA bilaterally  without wheezes, rales, or rhonchi; no distress Abdomen: Soft, non-tender, non-distended, bowel sounds positive. Extremities: No clubbing, cyanosis, or edema. Pulses are 2+ Skin: Clean and intact without signs of breakdown Neuro: fair insight and awareness.. Cranial nerves 2-12 are intact. Sensory exam is normal. Reflexes are 2+ in all 4's. Fine motor coordination is intact. No tremors. Motor function is grossly 4- to 4/5 in uppers and 3+ to 4/5 prox to distal in lowers..  Musculoskeletal: Full ROM, No pain with AROM or PROM in the neck, trunk, or extremities. Posture appropriate Psych: Pt's affect is appropriate. Pt is cooperative     Assessment/Plan: 1. Functional deficits which require 3+ hours per day of interdisciplinary therapy in a comprehensive inpatient rehab setting.  Physiatrist is providing close team supervision and 24 hour management of active medical problems listed below.  Physiatrist and rehab team continue to assess barriers to discharge/monitor patient progress toward functional and medical goals  Care Tool:  Bathing    Body parts bathed by patient: Right arm,Left arm,Chest,Abdomen,Front perineal area,Buttocks,Right upper leg,Left upper leg,Face   Body parts bathed by helper: Right lower leg,Left lower leg     Bathing assist Assist Level: Moderate Assistance - Patient 50 - 74%     Upper Body Dressing/Undressing Upper body dressing   What is the patient wearing?: Pull over shirt    Upper body assist Assist Level: Minimal Assistance - Patient > 75%    Lower Body Dressing/Undressing Lower body  dressing      What is the patient wearing?: Pants     Lower body assist Assist for lower body dressing: Moderate Assistance - Patient 50 - 74%     Toileting Toileting    Toileting assist Assist for toileting: Moderate Assistance - Patient 50 - 74%     Transfers Chair/bed transfer  Transfers assist     Chair/bed transfer assist level: Moderate Assistance -  Patient 50 - 74%     Locomotion Ambulation   Ambulation assist              Walk 10 feet activity   Assist           Walk 50 feet activity   Assist           Walk 150 feet activity   Assist           Walk 10 feet on uneven surface  activity   Assist           Wheelchair     Assist               Wheelchair 50 feet with 2 turns activity    Assist            Wheelchair 150 feet activity     Assist          Blood pressure 113/68, pulse 72, temperature (!) 97.4 F (36.3 C), temperature source Oral, resp. rate 17, height 5\' 6"  (1.676 m), weight 59.4 kg, SpO2 97 %.  Medical Problem List and Plan: 1.Functional and mobility deficitssecondary to debility after multiple medical issues, seizure disorder -patient may shower -ELOS/Goals: 10-14 days, supervision with PT, OT, SLP  -beginning therapies today 2. Antithrombotics: -DVT/anticoagulation:Pharmaceutical:Lovenox -antiplatelet therapy: N/A 3. Pain Management:Tylenol prn. 4. Mood:LCSW to follow for evaluation and support. -antipsychotic agents: N/A 5. Neuropsych: This patientis not fullycapable of making decisions on herown behalf. 6. Skin/Wound Care:Routine pressure relief measures. 7. Fluids/Electrolytes/Nutrition:  12/17:had discussion about importance of improving PO intake   -labs reviewed and WNL except for albumin which is sl improved   -offer supplemental shakes to boost protein 8. ESBL UTI with bacteremia: On Invanz-->repeat CT abdomen/pelvis 12/24 to help determine course of treatement 9. Question of renal abscess:Leucocytosis resolving on Invanz  12/17 wbc 7k 10. H/o seizures: Breakthrough seizure due to drug interaction--will have pharmacy continue to dose Depakote while on Invanz. Continue Vimpat bid with Depakote 1000/500 mg for now. On ativan taper.  11. Anxiety/agitation: Continue home  dose Klonopin 0.25 mg bid with Seroquel 25 mg bid for now.    -mood fairly up beat and positive today 12. Ongoing cough: CXR clear. Continue Tessalon perles with dulera bid.  13. Chronic constipation: Continue colace bid--monitor as now on antibiotics.     LOS: 1 days A FACE TO FACE EVALUATION WAS PERFORMED  Meredith Staggers 05/19/2020, 10:55 AM

## 2020-05-20 ENCOUNTER — Inpatient Hospital Stay (HOSPITAL_COMMUNITY): Payer: Medicare Other

## 2020-05-20 NOTE — Progress Notes (Signed)
Occupational Therapy Note  Patient Details  Name: Maria [name] MRN: [id number] Date of Birth: 1961-01-29  Attempted to see pt for make up time, however mother in room and pt rather, "fussy" per mother. Pt mother pleasantly requesting bag for clothing items to take home and time to visit since pt has tx tomorrow. Exited session with pt seated in bed, exit alarm on and call light in reach   Tonny Branch 05/20/2020, 3:26 PM

## 2020-05-20 NOTE — Progress Notes (Signed)
[facility] PHYSICAL MEDICINE & REHABILITATION PROGRESS NOTE   Subjective/Complaints:  Pt awake, says she's "OK"- had BM this AM- sneezed x2, but denies congestions, runny nose.   Denies pain. PT in room and notes no issues.   ROS: limited due to cognitionl   Objective:   No results found. Recent Labs    05/18/20 0018 05/19/20 0457  WBC 9.1 7.0  HGB 9.3* 9.3*  HCT 28.9* 30.6*  PLT 274 318   Recent Labs    05/18/20 0018 05/19/20 0457  NA 139 138  K 3.9 4.0  CL 103 102  CO2 25 26  GLUCOSE 102* 97  BUN 13 12  CREATININE 0.75 0.92  CALCIUM 9.2 9.4   No intake or output data in the 24 hours ending 05/20/20 1335   Pressure Injury 04/22/20 Back Medial;Right Stage 2 -  Partial thickness loss of dermis presenting as a shallow open injury with a red, pink wound bed without slough. Medical device pressure injury-shape of lopez valve (Active)  04/22/20 0800  Location: Back  Location Orientation: Medial;Right  Staging: Stage 2 -  Partial thickness loss of dermis presenting as a shallow open injury with a red, pink wound bed without slough.  Wound Description (Comments): Medical device pressure injury-shape of lopez valve  Present on Admission: No    Physical Exam: Vital Signs Blood pressure 132/68, pulse 70, temperature 98.2 F (36.8 C), resp. rate 16, height 5\' 6"  (1.676 m), weight 59.2 kg, SpO2 99 %.  General: frail, quiet, focused on eating breakfast, NAD  HEENT: conjugate gaze, no runny nose Neck: Supple without JVD or lymphadenopathy Heart: RRR Chest: CTA B/L- no W/R/R- good air movement- despite sneezing x2 Abdomen:Soft, NT, ND, (+)BS  Extremities: No clubbing, cyanosis, or edema. Pulses are 2+ Skin: Clean and intact without signs of breakdown Neuro: fair insight and awareness.. Cranial nerves 2-12 are intact. Sensory exam is normal. Reflexes are 2+ in all 4's. Fine motor coordination is intact. No tremors. Motor function is grossly 4- to 4/5 in uppers and 3+ to  4/5 prox to distal in lowers..  Musculoskeletal: Full ROM, No pain with AROM or PROM in the neck, trunk, or extremities. Posture appropriate Psych: appropriate, but slowed processing     Assessment/Plan: 1. Functional deficits which require 3+ hours per day of interdisciplinary therapy in a comprehensive inpatient rehab setting.  Physiatrist is providing close team supervision and 24 hour management of active medical problems listed below.  Physiatrist and rehab team continue to assess barriers to discharge/monitor patient progress toward functional and medical goals  Care Tool:  Bathing    Body parts bathed by patient: Right arm,Left arm,Chest,Abdomen,Front perineal area,Buttocks,Right upper leg,Left upper leg,Face   Body parts bathed by helper: Right lower leg,Left lower leg     Bathing assist Assist Level: Moderate Assistance - Patient 50 - 74%     Upper Body Dressing/Undressing Upper body dressing   What is the patient wearing?: Pull over shirt    Upper body assist Assist Level: Minimal Assistance - Patient > 75%    Lower Body Dressing/Undressing Lower body dressing      What is the patient wearing?: Pants     Lower body assist Assist for lower body dressing: Moderate Assistance - Patient 50 - 74%     Toileting Toileting    Toileting assist Assist for toileting: Moderate Assistance - Patient 50 - 74%     Transfers Chair/bed transfer  Transfers assist     Chair/bed transfer assist  level: Moderate Assistance - Patient 50 - 74%     Locomotion Ambulation   Ambulation assist      Assist level: Moderate Assistance - Patient 50 - 74% Assistive device: Hand held assist Max distance: 100'   Walk 10 feet activity   Assist     Assist level: Moderate Assistance - Patient - 50 - 74% Assistive device: Hand held assist   Walk 50 feet activity   Assist    Assist level: Moderate Assistance - Patient - 50 - 74% Assistive device: Hand held assist     Walk 150 feet activity   Assist Walk 150 feet activity did not occur: Safety/medical concerns         Walk 10 feet on uneven surface  activity   Assist Walk 10 feet on uneven surfaces activity did not occur: Safety/medical concerns         Wheelchair     Assist Will patient use wheelchair at discharge?: No             Wheelchair 50 feet with 2 turns activity    Assist            Wheelchair 150 feet activity     Assist          Blood pressure 132/68, pulse 70, temperature 98.2 F (36.8 C), resp. rate 16, height 5\' 6"  (1.676 m), weight 59.2 kg, SpO2 99 %.  Medical Problem List and Plan: 1.Functional and mobility deficitssecondary to debility after multiple medical issues, seizure disorder -patient may shower -ELOS/Goals: 10-14 days, supervision with PT, OT, SLP  -beginning therapies today 2. Antithrombotics: -DVT/anticoagulation:Pharmaceutical:Lovenox -antiplatelet therapy: N/A 3. Pain Management:Tylenol prn. 4. Mood:LCSW to follow for evaluation and support. -antipsychotic agents: N/A 5. Neuropsych: This patientis not fullycapable of making decisions on herown behalf. 6. Skin/Wound Care:Routine pressure relief measures. 7. Fluids/Electrolytes/Nutrition:  12/17:had discussion about importance of improving PO intake  12/18- pt eating more for breakfast currently- con't to encourage her to eat   -labs reviewed and WNL except for albumin which is sl improved   -offer supplemental shakes to boost protein 8. ESBL UTI with bacteremia: On Invanz-->repeat CT abdomen/pelvis 12/24 to help determine course of treatement 9. Question of renal abscess:Leucocytosis resolving on Invanz  12/17 wbc 7k 10. H/o seizures: Breakthrough seizure due to drug interaction--will have pharmacy continue to dose Depakote while on Invanz. Continue Vimpat bid with Depakote 1000/500 mg for now. On ativan  taper.  11. Anxiety/agitation: Continue home dose Klonopin 0.25 mg bid with Seroquel 25 mg bid for now.    -mood fairly up beat and positive today  12/18- no anxiety while I was in room- perseverating on food, though- con't regimen 12. Ongoing cough: CXR clear. Continue Tessalon perles with dulera bid.   12/18- no runny nose- clear on exam- con't meds as required-  13. Chronic constipation: Continue colace bid--monitor as now on antibiotics.  12/18- LBM this AM- not hard/loose- con't regimen     LOS: 2 days A FACE TO FACE EVALUATION WAS PERFORMED  Maria [name] 05/20/2020, 1:35 PM

## 2020-05-20 NOTE — Progress Notes (Signed)
Occupational Therapy Session Note  Patient Details  Name: Maria [name] MRN: [id number] Date of Birth: [birth year]  Today's Date: 05/20/2020 OT Individual Time: 9794-8016 OT Individual Time Calculation (min): 25 min    Short Term Goals: Week 1:  OT Short Term Goal 1 (Week 1): Patient will complete shower transfer with min A OT Short Term Goal 2 (Week 1): Pt will complete 2/3 toileting steps OT Short Term Goal 3 (Week 1): Pt will maintain standing balance with BADL tasks with min A  Skilled Therapeutic Interventions/Progress Updates:    1;1. Pt received in w/c with RN present after toileting. Pt requesting to eat but not wearing pants. Pt agreeable to dressing before self feeding. Pt completes doffing and donning new shirt with setup. Pt requris MIN A to pull pants up past L foot after starting threading as non skid sock gets caught and pt unable to problem solve or follow cues to pull up from the back. Pt completes sit to stand with MIN A overall to advance pants past hips with sink for steadying. Pt requries MIN A for opening packages and containers/set up breakfast tray in organized manner. Exited session with pt seated in w/c, exit alarm on and call light in reach. Pt missed 35 min tx d/t eating breakfast and refuse tx till finished   Therapy Documentation Precautions:  Precautions Precautions: Fall Restrictions Weight Bearing Restrictions: No General:   Vital Signs: Therapy Vitals Temp: 98.2 F (36.8 C) Pulse Rate: 70 Resp: 16 BP: 132/68 Patient Position (if appropriate): Lying Oxygen Therapy SpO2: 99 % O2 Device: Room Air Pain:   ADL: ADL Eating: Set up Grooming: Minimal assistance Upper Body Bathing: Minimal assistance Lower Body Bathing: Moderate assistance Upper Body Dressing: Minimal assistance Lower Body Dressing: Moderate assistance Toileting: Moderate assistance Toilet Transfer: Moderate assistance Vision   Perception    Praxis   Exercises:    Other Treatments:     Therapy/Group: Individual Therapy  Tonny Branch 05/20/2020, 6:40 AM

## 2020-05-21 ENCOUNTER — Inpatient Hospital Stay (HOSPITAL_COMMUNITY): Payer: Medicare Other

## 2020-05-21 ENCOUNTER — Inpatient Hospital Stay (HOSPITAL_COMMUNITY): Payer: Medicare Other | Admitting: Physical Therapy

## 2020-05-21 ENCOUNTER — Inpatient Hospital Stay (HOSPITAL_COMMUNITY): Payer: Medicare Other | Admitting: Speech Pathology

## 2020-05-21 ENCOUNTER — Inpatient Hospital Stay (HOSPITAL_COMMUNITY): Payer: Medicare Other | Admitting: Occupational Therapy

## 2020-05-21 LAB — VALPROIC ACID LEVEL: Valproic Acid Lvl: 23 ug/mL — ABNORMAL LOW (ref 50.0–100.0)

## 2020-05-21 MED ORDER — DIVALPROEX SODIUM 500 MG PO DR TAB
500.0000 mg | DELAYED_RELEASE_TABLET | Freq: Once | ORAL | Status: AC
  Administered 2020-05-21: 10:00:00 500 mg via ORAL
  Filled 2020-05-21: qty 1

## 2020-05-21 MED ORDER — DIVALPROEX SODIUM 500 MG PO DR TAB
1000.0000 mg | DELAYED_RELEASE_TABLET | Freq: Every day | ORAL | Status: DC
  Administered 2020-05-22 – 2020-06-01 (×11): 1000 mg via ORAL
  Filled 2020-05-21 (×11): qty 2

## 2020-05-21 NOTE — Progress Notes (Signed)
Speech Language Pathology Daily Session Note  Patient Details  Name: Maria [name] MRN: [id number] Date of Birth: 23-Apr-1961  Today's Date: 05/21/2020 SLP Individual Time: 1445-1530 SLP Individual Time Calculation (min): 45 min  Short Term Goals: Week 1: SLP Short Term Goal 1 (Week 1): Pt will tolerate trials of dysphagia 4 consistency and thin liquids with no overt s/sx of aspiration or penetration on 9/10 trials. SLP Short Term Goal 2 (Week 1): Pt will demonstrate adequate safety awareness for hospital situation with 85% accuracy min a verbal cues. SLP Short Term Goal 3 (Week 1): Pt will demonstrate orientation x4 using external aids with min A verbal cues. SLP Short Term Goal 4 (Week 1): Pt will sustain attention to functional tasks for 5-6 minutes with min A verbal cues.  Skilled Therapeutic Interventions:  Pt was seen for skilled ST targeting cognitive goals.  Upon arrival, pt's mother and brother were at bedside attempting to order pt's dinner and breakfast meals.  Pt's mother couldn't hear the ambassadors over the phone so SLP assisted pt in placing lunch order.  Pt verbalized frustration when told there were certain menu items that she could not order because of dys 3 diet but was easily redirected.  Pt was more interactive today than reports of previous therapy sessions and shared with therapist that she enjoyed reading and coloring in adult coloring books with markers prior to admission.  She also shared biographical information regarding her childhood freely.  Pt requested to go to the bathroom during therapy session and appropriately asked for her non slip hospital socks.  She donned them independently before sitting at edge of bed and demonstrated appropriate safety awareness by always waiting for therapist before moving.  She voided while seated on the commode, avoiding incontinence.  Pt was transferred back to bed and left with bed alarm set and call bell within reach.  Continue per  current plan of care.    Pain Pain Assessment Pain Scale: 0-10 Pain Score: 0-No pain  Therapy/Group: Individual Therapy  Thimothy Barretta, Selinda Orion 05/21/2020, 4:13 PM

## 2020-05-21 NOTE — Progress Notes (Signed)
Pharmacy Consult  Pharmacy consulted for divalproex (Depakote) management. Patient on PTA at dose of 500 mg BID- which was continued as inpatient. Has history of seizure disorder - on divalproex, vimpat, and clonazepam/valium PTA. Per outpatient note on 11/25/2019 the last seizure was about 4 years ago and she she has had been on divalproex, vimpat, and clonazepan since at least 11/2015.    She is on ertapenem for ESBL E Coli  Bactteria. Of note, significant interaction between ertapenem and divalproex - concurrent use may result in decrease valproic acid concentrations. She was noted with a focal seizure on the evening on 12/14.  Valproic acid levels: 12/6: 51 12/12: < 10   12/16: < 10 12/19: 23  Patient has been on 500 mg in AM and 1000 mg at bedtime since 12/16. Level today is subtherapeutic (goal 50-100 mcg/mL).  Plan -Increase Depakote to 1000mg  po in the evening and 1000mg  in the morning  -Check a level in 2-4 days based on regimen compliance  Fara Olden, PharmD PGY-1 Pharmacy Resident 05/21/2020 9:00 AM Please see AMION for all pharmacy numbers

## 2020-05-21 NOTE — Progress Notes (Addendum)
Physical Therapy Session Note  Patient Details  Name: Maria [name] MRN: [id number] Date of Birth: 03/11/1961  Today's Date: 05/21/2020 PT Individual Time: 0900-0930 PT Individual Time Calculation (min): 30 min  and Today's Date: 05/21/2020 PT Missed Time: 99 Minutes Missed Time Reason: Patient unwilling to participate  Short Term Goals: Week 1:  PT Short Term Goal 1 (Week 1): Pt will perform supine<>sit with min assist PT Short Term Goal 2 (Week 1): Pt will perform sit<>stands using LRAD with min assist PT Short Term Goal 3 (Week 1): Pt will perform bed<>chair transfers using LRAD with min assist PT Short Term Goal 4 (Week 1): Pt will ambulate at least 21ft using LRAD with min assist PT Short Term Goal 5 (Week 1): Pt will ascend/descend at least 1 step using LRAD with mod assist  Skilled Therapeutic Interventions/Progress Updates:   Received pt supine in bed reporting feeling cold. Therapist provided pt with extra blanket. Pt reported mild discomfort in stomach and therapist suggested using restroom, however pt declined. Noted, pt still in pajamas, therapist suggested getting dressed and pt became agitated stating "I don't have any, my momma is bringing me some later." Therapist spoke with soothing tone and attempted to calm pt. Therapist suggested going to gym for improved mood and change of scenery, however pt declined stating "I want to stay here for a while." Therapist encouraged bed level exercises however pt declined and insisted on calling mother. Therapist assisted pt with calling mother. After speaking with mother pt appeared in better spirits and agreeable to ambulate in room only. Donned non-skid socks total A and pt transferred supine<>sitting EOB with HOB elevated and supervision and transferred sit<>stand with RW and min A. Pt ambulated 54ft x 2 trials with RW and min A. Pt requested to wash face and stood at sink and washed face with CGA for balance. Returned to bed and pt  transferred sit<>supine with supervision and doffed non-skid socks with supervision. Pt requested to stay in bed and watch the [employer] channel. Concluded session with pt supine in bed, needs within reach, and bed alarm on. 45 minutes missed of skilled physical therapy due to pt's unwillingness to participate.   Therapy Documentation Precautions:  Precautions Precautions: Fall Restrictions Weight Bearing Restrictions: No   Therapy/Group: Individual Therapy Alfonse Alpers PT, DPT   05/21/2020, 7:21 AM

## 2020-05-21 NOTE — Progress Notes (Signed)
Physical Therapy Note  Patient Details  Name: Maria [name] MRN: [id number] Date of Birth: 1960-12-04 Today's Date: 05/21/2020    Pt received seated in bed in room. Pt refuses participation in therapy session this PM and states, "I already worked with that therapist earlier today". Encouraged to perform ambulation, balance training, therex, etc. but pt continues to decline any participation in therapy session this PM with no reason given. Pt missed 30 min of scheduled therapy session due to refusal.    Excell Seltzer, PT, DPT  05/21/2020, 4:58 PM

## 2020-05-21 NOTE — Progress Notes (Signed)
Occupational Therapy Session Note  Patient Details  Name: Maria [name] MRN: [id number] Date of Birth: 1960/11/28  Today's Date: 05/21/2020 OT Individual Time: 1300-1326 OT Individual Time Calculation (min): 26 min   Short Term Goals: Week 1:  OT Short Term Goal 1 (Week 1): Patient will complete shower transfer with min A OT Short Term Goal 2 (Week 1): Pt will complete 2/3 toileting steps OT Short Term Goal 3 (Week 1): Pt will maintain standing balance with BADL tasks with min A  Skilled Therapeutic Interventions/Progress Updates:    Pt greeted in bed, finishing up lunch with family member present. Pt reported needing to use the restroom. Supervision for bed mobility and Min A for stand pivot<w/c using RW with vcs for hand placement. Min A for stand pivot<toilet using the grab bar where pt then lowered her clothing. She had +bladder void and completed hygiene while seated, Min A for sit<stand and then pt completed clothing mgt with CGA balance assist using the RW for standing support. Transfer back to the w/c completed using device. OT and family member encouraged pt to stand to wash her hands however pt refused, washed hands while sitting instead. With additional encouragement to brush her teeth in standing, pt did so with Min A for sit<stand. Min cues for sequencing when completing oral care given CGA for balance assistance. Min A for short distance ambulatory transfer back to bed using the RW after, pt needing manual facilitation for device positioning before completing stand<sit. She returned to bed and after family member doffed soiled gripper socks, pt donned 1 clean gripper sock with encouragement before taking it off and reporting she didn't want to wear socks to bed. Left her in bed with all needs within reach, bed alarm set, and family member still present. Tx focus placed on functional transfers, OOB tolerance, standing endurance, and sit<stands.   Therapy Documentation Precautions:   Precautions Precautions: Fall Restrictions Weight Bearing Restrictions: No Vital Signs: Therapy Vitals Temp: 97.6 F (36.4 C) Temp Source: Oral Pulse Rate: 81 Resp: 18 BP: (!) 112/58 Patient Position (if appropriate): Lying Oxygen Therapy SpO2: 100 % O2 Device: Room Air Pain: pt reported having neck pain but refused tylenol from RN, also refused aromatherapy offered by therapist to address   ADL: ADL Eating: Set up Grooming: Minimal assistance Upper Body Bathing: Minimal assistance Lower Body Bathing: Moderate assistance Upper Body Dressing: Minimal assistance Lower Body Dressing: Moderate assistance Toileting: Moderate assistance Toilet Transfer: Moderate assistance      Therapy/Group: Individual Therapy  Maksym Pfiffner A Jamareon Shimel 05/21/2020, 3:59 PM

## 2020-05-22 ENCOUNTER — Inpatient Hospital Stay (HOSPITAL_COMMUNITY): Payer: Medicare Other | Admitting: Speech Pathology

## 2020-05-22 ENCOUNTER — Inpatient Hospital Stay (HOSPITAL_COMMUNITY): Payer: Medicare Other | Admitting: Occupational Therapy

## 2020-05-22 ENCOUNTER — Inpatient Hospital Stay (HOSPITAL_COMMUNITY): Payer: Medicare Other

## 2020-05-22 LAB — CBC
HCT: 31.7 % — ABNORMAL LOW (ref 36.0–46.0)
Hemoglobin: 9.7 g/dL — ABNORMAL LOW (ref 12.0–15.0)
MCH: 28.8 pg (ref 26.0–34.0)
MCHC: 30.6 g/dL (ref 30.0–36.0)
MCV: 94.1 fL (ref 80.0–100.0)
Platelets: 322 10*3/uL (ref 150–400)
RBC: 3.37 MIL/uL — ABNORMAL LOW (ref 3.87–5.11)
RDW: 16.6 % — ABNORMAL HIGH (ref 11.5–15.5)
WBC: 7.5 10*3/uL (ref 4.0–10.5)
nRBC: 0 % (ref 0.0–0.2)

## 2020-05-22 LAB — BASIC METABOLIC PANEL
Anion gap: 14 (ref 5–15)
BUN: 11 mg/dL (ref 6–20)
CO2: 23 mmol/L (ref 22–32)
Calcium: 9.5 mg/dL (ref 8.9–10.3)
Chloride: 103 mmol/L (ref 98–111)
Creatinine, Ser: 0.81 mg/dL (ref 0.44–1.00)
GFR, Estimated: >60 mL/min (ref >60)
Glucose, Bld: 92 mg/dL (ref 70–99)
Potassium: 4 mmol/L (ref 3.5–5.1)
Sodium: 140 mmol/L (ref 135–145)

## 2020-05-22 NOTE — Progress Notes (Signed)
[facility] PHYSICAL MEDICINE & REHABILITATION PROGRESS NOTE   Subjective/Complaints:  In bed. Doesn't want to do therapy.   ROS: Limited due to cognitive/behavioral   Objective:   No results found. Recent Labs    05/22/20 0635  WBC 7.5  HGB 9.7*  HCT 31.7*  PLT 322   Recent Labs    05/22/20 0635  NA 140  K 4.0  CL 103  CO2 23  GLUCOSE 92  BUN 11  CREATININE 0.81  CALCIUM 9.5    Intake/Output Summary (Last 24 hours) at 05/22/2020 1015 Last data filed at 05/22/2020 0900 Gross per 24 hour  Intake 120 ml  Output --  Net 120 ml     Pressure Injury 04/22/20 Back Medial;Right Stage 2 -  Partial thickness loss of dermis presenting as a shallow open injury with a red, pink wound bed without slough. Medical device pressure injury-shape of lopez valve (Active)  04/22/20 0800  Location: Back  Location Orientation: Medial;Right  Staging: Stage 2 -  Partial thickness loss of dermis presenting as a shallow open injury with a red, pink wound bed without slough.  Wound Description (Comments): Medical device pressure injury-shape of lopez valve  Present on Admission: No    Physical Exam: Vital Signs Blood pressure 119/78, pulse 80, temperature 98.4 F (36.9 C), resp. rate 16, height 5\' 6"  (1.676 m), weight 58.2 kg, SpO2 98 %.  Constitutional: No distress . Vital signs reviewed. HEENT: EOMI, oral membranes moist Neck: supple Cardiovascular: RRR without murmur. No JVD    Respiratory/Chest: CTA Bilaterally without wheezes or rales. Normal effort    GI/Abdomen: BS +, non-tender, non-distended Ext: no clubbing, cyanosis, or edema Psych: pleasant and cooperative Skin: Clean and intact without signs of breakdown Neuro: fair insight and awareness.. Cranial nerves 2-12 are intact. Sensory exam is normal. Reflexes are 2+ in all 4's. Fine motor coordination is intact. No tremors. Motor function is grossly 4- to 4/5 in uppers and 3+ to 4/5 prox to distal in lowers..   Musculoskeletal: Full ROM, No pain with AROM or PROM in the neck, trunk, or extremities. Posture appropriate      Assessment/Plan: 1. Functional deficits which require 3+ hours per day of interdisciplinary therapy in a comprehensive inpatient rehab setting.  Physiatrist is providing close team supervision and 24 hour management of active medical problems listed below.  Physiatrist and rehab team continue to assess barriers to discharge/monitor patient progress toward functional and medical goals  Care Tool:  Bathing    Body parts bathed by patient: Right arm,Left arm,Chest,Abdomen,Front perineal area,Buttocks,Right upper leg,Left upper leg,Face   Body parts bathed by helper: Right lower leg,Left lower leg     Bathing assist Assist Level: Moderate Assistance - Patient 50 - 74%     Upper Body Dressing/Undressing Upper body dressing   What is the patient wearing?: Button up shirt    Upper body assist Assist Level: Minimal Assistance - Patient > 75%    Lower Body Dressing/Undressing Lower body dressing      What is the patient wearing?: Pants,Incontinence brief     Lower body assist Assist for lower body dressing: Minimal Assistance - Patient > 75%     Toileting Toileting    Toileting assist Assist for toileting: Minimal Assistance - Patient > 75%     Transfers Chair/bed transfer  Transfers assist     Chair/bed transfer assist level: Moderate Assistance - Patient 50 - 74%     Locomotion Ambulation   Ambulation assist  Assist level: Minimal Assistance - Patient > 75% Assistive device: Walker-rolling Max distance: 84ft   Walk 10 feet activity   Assist     Assist level: Minimal Assistance - Patient > 75% Assistive device: Walker-rolling   Walk 50 feet activity   Assist    Assist level: Moderate Assistance - Patient - 50 - 74% Assistive device: Hand held assist    Walk 150 feet activity   Assist Walk 150 feet activity did not  occur: Safety/medical concerns         Walk 10 feet on uneven surface  activity   Assist Walk 10 feet on uneven surfaces activity did not occur: Safety/medical concerns         Wheelchair     Assist Will patient use wheelchair at discharge?: No             Wheelchair 50 feet with 2 turns activity    Assist            Wheelchair 150 feet activity     Assist          Blood pressure 119/78, pulse 80, temperature 98.4 F (36.9 C), resp. rate 16, height 5\' 6"  (1.676 m), weight 58.2 kg, SpO2 98 %.  Medical Problem List and Plan: 1.Functional and mobility deficitssecondary to debility after multiple medical issues, seizure disorder -patient may shower -ELOS/Goals: 10-14 days, supervision with PT, OT, SLP  -continue therapies/team providing positive reinforcement 2. Antithrombotics: -DVT/anticoagulation:Pharmaceutical:Lovenox -antiplatelet therapy: N/A 3. Pain Management:Tylenol prn. 4. Mood:LCSW to follow for evaluation and support. -antipsychotic agents: N/A 5. Neuropsych: This patientis not fullycapable of making decisions on herown behalf. 6. Skin/Wound Care:Routine pressure relief measures. 7. Fluids/Electrolytes/Nutrition:  12/20 -offering supplemental shakes to boost protein   -encouraging po 8. ESBL UTI with bacteremia: On Invanz-->repeat CT abdomen/pelvis 12/24 to help determine course of treatement 9. Question of renal abscess:Leucocytosis resolving on Invanz  12/20 wbc 7k 10. H/o seizures: Breakthrough seizure due to drug interaction--will have pharmacy continue to dose Depakote while on Invanz. Continue Vimpat bid with Depakote 1000/500 mg for now. On ativan taper.  11. Anxiety/agitation: Continue home dose Klonopin 0.25 mg bid with Seroquel 25 mg bid for now.    -mood fairly up beat and positive today  12/18- no anxiety while I was in room- perseverating on food, though-  con't regimen 12. Ongoing cough: CXR clear. Continue Tessalon perles with dulera bid.    13. Chronic constipation: Continue colace bid--monitor as now on antibiotics.  12/20 moving bowels. Needs to eat    LOS: 4 days A FACE TO FACE EVALUATION WAS PERFORMED  Meredith Staggers 05/22/2020, 10:15 AM

## 2020-05-22 NOTE — Progress Notes (Signed)
Physical Therapy Session Note  Patient Details  Name: Maria [name] MRN: [id number] Date of Birth: November 09, 1960  Today's Date: 05/22/2020 PT Individual Time: 1314-3888 PT Individual Time Calculation (min): 30 min   Short Term Goals: Week 1:  PT Short Term Goal 1 (Week 1): Pt will perform supine<>sit with min assist PT Short Term Goal 2 (Week 1): Pt will perform sit<>stands using LRAD with min assist PT Short Term Goal 3 (Week 1): Pt will perform bed<>chair transfers using LRAD with min assist PT Short Term Goal 4 (Week 1): Pt will ambulate at least 85ft using LRAD with min assist PT Short Term Goal 5 (Week 1): Pt will ascend/descend at least 1 step using LRAD with mod assist Week 2:    Week 3:     Skilled Therapeutic Interventions/Progress Updates:    PAIN denies pain this am  Pt initially oob in wc.  Transported to gym for session.  Sit to stand w/cga and gait x 189ft w/RW and supervision, slow cadence, overall flexed posture.  Stairs:  Ascends/descends 8 5in steps w/2 rails w/close supervision,  Ascends/descends 16 3in steps 2/2 rails w/close supervision.  Pt transported back to room, requested return to bed, encouraged pt to remain oob in chair due to closely scheduled next session and she was agreeable and left oob in wc w/needs in reach.  Therapy Documentation Precautions:  Precautions Precautions: Fall Restrictions Weight Bearing Restrictions: No    Therapy/Group: Individual Therapy  Callie Fielding, [place] 05/22/2020, 10:45 AM

## 2020-05-22 NOTE — Progress Notes (Signed)
Speech Language Pathology Daily Session Note  Patient Details  Name: Maria [name] MRN: [id number] Date of Birth: July 05, 1960  Today's Date: 05/22/2020 SLP Individual Time: 4709-6283 SLP Individual Time Calculation (min): 55 min  Short Term Goals: Week 1: SLP Short Term Goal 1 (Week 1): Pt will tolerate trials of dysphagia 4 consistency and thin liquids with no overt s/sx of aspiration or penetration on 9/10 trials. SLP Short Term Goal 2 (Week 1): Pt will demonstrate adequate safety awareness for hospital situation with 85% accuracy min a verbal cues. SLP Short Term Goal 3 (Week 1): Pt will demonstrate orientation x4 using external aids with min A verbal cues. SLP Short Term Goal 4 (Week 1): Pt will sustain attention to functional tasks for 5-6 minutes with min A verbal cues.  Skilled Therapeutic Interventions: Skilled treatment session focused on cognitive and dysphagia goals. Upon arrival, patient requested to use the bathroom. Patient donned her socks independently and demonstrated appropriate safety awareness by waiting for clinician prior to standing. Patient was continent of bladder. Patient requested to wash her hands and performed task with Mod I. Patient was orientation place, situation and month independently and required Min verbal cues for orientation to date. Patient declined trials of solid textures but consumed a magic cup without overt s/s of aspiration. Patient demonstrated sustained attention to task for ~10 minutes with overall Mod I. Recommend patient continue current diet. Patient transferred back to bed at end of session. Patient left with alarm on and all needs within reach. Continue with current plan of care.      Pain No/Denies Pain   Therapy/Group: Individual Therapy  Terez Montee, [place] 05/22/2020, 3:16 PM

## 2020-05-22 NOTE — Care Management (Signed)
Inpatient [facility] Individual Statement of Services  Patient Name:  Maria [name]  Date:  05/22/2020  Welcome to the [facility].  Our goal is to provide you with an individualized program based on your diagnosis and situation, designed to meet your specific needs.  With this comprehensive rehabilitation program, you will be expected to participate in at least 3 hours of rehabilitation therapies Monday-Friday, with modified therapy programming on the weekends.  Your rehabilitation program will include the following services:  Physical Therapy (PT), Occupational Therapy (OT), Speech Therapy (ST), 24 hour per day rehabilitation nursing, Therapeutic Recreaction (TR), Psychology, Neuropsychology, Care Coordinator, Rehabilitation Medicine, Nutrition Services, Pharmacy Services and Other  Weekly team conferences will be held on Tuesdays to discuss your progress.  Your Inpatient Rehabilitation Care Coordinator will talk with you frequently to get your input and to update you on team discussions.  Team conferences with you and your family in attendance may also be held.  Expected length of stay: 14-17 days  Overall anticipated outcome: Supervision  Depending on your progress and recovery, your program may change. Your Inpatient Rehabilitation Care Coordinator will coordinate services and will keep you informed of any changes. Your Inpatient Rehabilitation Care Coordinator's name and contact numbers are listed  below.  The following services may also be recommended but are not provided by the [facility] will be made to provide these services after discharge if needed.  Arrangements include referral to agencies that provide these services.  Your insurance has been verified to be:  Medicare A/B  Your  primary doctor is:  Allyn Kenner  Pertinent information will be shared with your doctor and your insurance company.  Inpatient Rehabilitation Care Coordinator:  Cathleen Corti [phone] or ([phone]  Information discussed with and copy given to patient by: Rana Snare, 05/22/2020, 12:47 PM

## 2020-05-22 NOTE — Progress Notes (Signed)
Occupational Therapy Session Note  Patient Details  Name: Maria [name] MRN: [id number] Date of Birth: 20-Jan-1961  Today's Date: 05/22/2020 OT Individual Time: 0930-1000 OT Individual Time Calculation (min): 30 min    Short Term Goals: Week 1:  OT Short Term Goal 1 (Week 1): Patient will complete shower transfer with min A OT Short Term Goal 2 (Week 1): Pt will complete 2/3 toileting steps OT Short Term Goal 3 (Week 1): Pt will maintain standing balance with BADL tasks with min A  Skilled Therapeutic Interventions/Progress Updates:    Attempted to see pt for make up time, however pt stating, "I want to sleep just a little longer." Pt agreeable to OT coming back in 10 min. Upon arrival later, pt agreeable to go to the bathroom. Pt completes MIN A stand pivot transfers with no AD and VC for hand placement. Pt able ot completes clothing amangement in stanidng with increased ant/post sway with no UE support on grab bar and CGA. Pt grooms at sink with S and VC for initiation and termination of tasks. OT braids hair with pt left sitting at w/c with belt alarm on, call light in reach and awaiting PT.   Therapy Documentation Precautions:  Precautions Precautions: Fall Restrictions Weight Bearing Restrictions: No General:   Vital Signs: Therapy Vitals Temp: 98.4 F (36.9 C) Pulse Rate: 80 Resp: 16 BP: 119/78 Patient Position (if appropriate): Lying Oxygen Therapy SpO2: 98 % O2 Device: Room Air Pain: Pain Assessment Pain Scale: 0-10 Pain Score: 0-No pain ADL: ADL Eating: Set up Grooming: Minimal assistance Upper Body Bathing: Minimal assistance Lower Body Bathing: Moderate assistance Upper Body Dressing: Minimal assistance Lower Body Dressing: Moderate assistance Toileting: Moderate assistance Toilet Transfer: Moderate assistance Vision   Perception    Praxis   Exercises:   Other Treatments:     Therapy/Group: Individual Therapy  Tonny Branch 05/22/2020, 9:20 AM

## 2020-05-22 NOTE — Progress Notes (Signed)
Occupational Therapy Session Note  Patient Details  Name: Maria [name] MRN: [id number] Date of Birth: May 26, 1961  Today's Date: 05/22/2020 OT Individual Time: 6015-6153 OT Individual Time Calculation (min): 45 min  and Today's Date: 05/22/2020 OT Missed Time: 15 Minutes Missed Time Reason: Patient fatigue   Short Term Goals: Week 1:  OT Short Term Goal 1 (Week 1): Patient will complete shower transfer with min A OT Short Term Goal 2 (Week 1): Pt will complete 2/3 toileting steps OT Short Term Goal 3 (Week 1): Pt will maintain standing balance with BADL tasks with min A  Skilled Therapeutic Interventions/Progress Updates:    Patient greeted sitting in wc and agreeable to OT treatment session with encouragement. Pt declined to shower stating she showered yesterday. Pt reported need to go to the bathroom.  Increased time to initiate stand. Pt ambulated to bathroom with min HHA. Pt with continent void in commode and was able to complete peri-care and clothing management with supervision. Pt ambulated back out of bathroom and stood at the sink to wash hands with verbal cues. OT had pt walk to the dresser and collect clothing items with min A and verbal cues for safety and positioning to reach into drawer. Pt given ice chips at her request, and ate ice at EOB. Pt then agreeable to complete 5 sit<>stands for general strengthening, but refused further participation. Pt returned to bed with supervision and left semi-reclined in bed with needs met.   Therapy Documentation Precautions:  Precautions Precautions: Fall Restrictions Weight Bearing Restrictions: No General: General OT Amount of Missed Time: 15 Minutes Pain:  denies pain   Therapy/Group: Individual Therapy  Valma Cava 05/22/2020, 12:16 PM

## 2020-05-22 NOTE — Progress Notes (Signed)
Physical Therapy Session Note  Patient Details  Name: Maria [name] MRN: [id number] Date of Birth: December 06, 1960  Today's Date: 05/22/2020 PT Individual Time: 5072-2575 PT Individual Time Calculation (min): 43 min   Short Term Goals: Week 1:  PT Short Term Goal 1 (Week 1): Pt will perform supine<>sit with min assist PT Short Term Goal 2 (Week 1): Pt will perform sit<>stands using LRAD with min assist PT Short Term Goal 3 (Week 1): Pt will perform bed<>chair transfers using LRAD with min assist PT Short Term Goal 4 (Week 1): Pt will ambulate at least 71ft using LRAD with min assist PT Short Term Goal 5 (Week 1): Pt will ascend/descend at least 1 step using LRAD with mod assist  Skilled Therapeutic Interventions/Progress Updates:     Pt received supine in bed and agrees to therapy. Reports cold feet but no complaint of pain. Bed mobility with bed features and supervision for safety. Pt performs stand pivot transfer to [place] with minA. WC transport to gym for time management. Pt ambulates x200' with HHA minA, with PT cuing for increased gait speed, increased stride length, and decreased stride width for improved mechanics and decreased risk for fall. Following seated rest break pt ambulates additional 250', including up/down ramp, over simulated mulch and x1 step with HHA minA, and verbalizes need for bowel movement. Toilet transfer with CGA. Pt is continent of bowel and requires minimal assistance for thorough pericare. Pt ambulates x150' with HHA minA, with PT facilitating hip rotation and trunk extension. WC transport back to room. Stand step transfer to bed with CGA. Sit to supine with supervision. Pt left supine in bed with alarm intact and all needs within reach.  Therapy Documentation Precautions:  Precautions Precautions: Fall Restrictions Weight Bearing Restrictions: No   Therapy/Group: Individual Therapy  Breck Coons, PT, DPT 05/22/2020, 3:47 PM

## 2020-05-22 NOTE — Progress Notes (Signed)
Patient ID: Maria [name], female   DOB: 09/07/1960, 59 y.o.   MRN: [id number]  SW spoke with pt sister [name] [phone]) to inform on ELOS 2-2.5 weeks. SW informed will follow-up with updates after team conference.   Loralee Pacas, MSW, [place] Office: [phone] Cell: [phone] Fax: [phone]

## 2020-05-23 ENCOUNTER — Inpatient Hospital Stay (HOSPITAL_COMMUNITY): Payer: Medicare Other

## 2020-05-23 ENCOUNTER — Inpatient Hospital Stay (HOSPITAL_COMMUNITY): Payer: Medicare Other | Admitting: Occupational Therapy

## 2020-05-23 ENCOUNTER — Encounter (INDEPENDENT_AMBULATORY_CARE_PROVIDER_SITE_OTHER): Payer: Self-pay

## 2020-05-23 LAB — SEDIMENTATION RATE: Sed Rate: 37 mm/hr — ABNORMAL HIGH (ref 0–22)

## 2020-05-23 LAB — C-REACTIVE PROTEIN: CRP: 0.7 mg/dL (ref <1.0)

## 2020-05-23 NOTE — Progress Notes (Signed)
[facility] PHYSICAL MEDICINE & REHABILITATION PROGRESS NOTE   Subjective/Complaints: Felt better later in day yesterday. Up again early today. No pain complaints  ROS: Limited due to cognitive/behavioral     Objective:   No results found. Recent Labs    05/22/20 0635  WBC 7.5  HGB 9.7*  HCT 31.7*  PLT 322   Recent Labs    05/22/20 0635  NA 140  K 4.0  CL 103  CO2 23  GLUCOSE 92  BUN 11  CREATININE 0.81  CALCIUM 9.5    Intake/Output Summary (Last 24 hours) at 05/23/2020 1104 Last data filed at 05/23/2020 0836 Gross per 24 hour  Intake 420 ml  Output --  Net 420 ml     Pressure Injury 04/22/20 Back Medial;Right Stage 2 -  Partial thickness loss of dermis presenting as a shallow open injury with a red, pink wound bed without slough. Medical device pressure injury-shape of lopez valve (Active)  04/22/20 0800  Location: Back  Location Orientation: Medial;Right  Staging: Stage 2 -  Partial thickness loss of dermis presenting as a shallow open injury with a red, pink wound bed without slough.  Wound Description (Comments): Medical device pressure injury-shape of lopez valve  Present on Admission: No    Physical Exam: Vital Signs Blood pressure 133/75, pulse 70, temperature 98.1 F (36.7 C), temperature source Oral, resp. rate 19, height 5\' 6"  (1.676 m), weight 62.5 kg, SpO2 97 %.  Constitutional: No distress . Vital signs reviewed. HEENT: EOMI, oral membranes moist Neck: supple Cardiovascular: RRR without murmur. No JVD    Respiratory/Chest: CTA Bilaterally without wheezes or rales. Normal effort    GI/Abdomen: BS +, non-tender, non-distended Ext: no clubbing, cyanosis, or edema Psych: flat Skin: Clean and intact without signs of breakdown Neuro: fair insight and awareness.. Cranial nerves 2-12 are intact. Sensory exam is normal. Reflexes are 2+ in all 4's. Fine motor coordination is intact. No tremors. Motor function is grossly 4- to 4/5 in uppers and 3+ to  4/5 prox to distal in lowers..  Musculoskeletal: Full ROM, No pain with AROM or PROM in the neck, trunk, or extremities. Posture appropriate      Assessment/Plan: 1. Functional deficits which require 3+ hours per day of interdisciplinary therapy in a comprehensive inpatient rehab setting.  Physiatrist is providing close team supervision and 24 hour management of active medical problems listed below.  Physiatrist and rehab team continue to assess barriers to discharge/monitor patient progress toward functional and medical goals  Care Tool:  Bathing    Body parts bathed by patient: Right arm,Left arm,Chest,Abdomen,Front perineal area,Buttocks,Right upper leg,Left upper leg,Face   Body parts bathed by helper: Right lower leg,Left lower leg     Bathing assist Assist Level: Moderate Assistance - Patient 50 - 74%     Upper Body Dressing/Undressing Upper body dressing   What is the patient wearing?: Button up shirt    Upper body assist Assist Level: Minimal Assistance - Patient > 75%    Lower Body Dressing/Undressing Lower body dressing      What is the patient wearing?: Pants,Incontinence brief     Lower body assist Assist for lower body dressing: Minimal Assistance - Patient > 75%     Toileting Toileting    Toileting assist Assist for toileting: Minimal Assistance - Patient > 75%     Transfers Chair/bed transfer  Transfers assist     Chair/bed transfer assist level: Minimal Assistance - Patient > 75%     Locomotion  Ambulation   Ambulation assist      Assist level: Minimal Assistance - Patient > 75% Assistive device: Hand held assist Max distance: 250'   Walk 10 feet activity   Assist     Assist level: Minimal Assistance - Patient > 75% Assistive device: Hand held assist   Walk 50 feet activity   Assist    Assist level: Minimal Assistance - Patient > 75% Assistive device: Hand held assist    Walk 150 feet activity   Assist Walk 150  feet activity did not occur: Safety/medical concerns  Assist level: Minimal Assistance - Patient > 75% Assistive device: Hand held assist    Walk 10 feet on uneven surface  activity   Assist Walk 10 feet on uneven surfaces activity did not occur: Safety/medical concerns   Assist level: Minimal Assistance - Patient > 75% Assistive device: Hand held assist   Wheelchair     Assist Will patient use wheelchair at discharge?: No             Wheelchair 50 feet with 2 turns activity    Assist            Wheelchair 150 feet activity     Assist          Blood pressure 133/75, pulse 70, temperature 98.1 F (36.7 C), temperature source Oral, resp. rate 19, height 5\' 6"  (1.676 m), weight 62.5 kg, SpO2 97 %.  Medical Problem List and Plan: 1.Functional and mobility deficitssecondary to debility after multiple medical issues, seizure disorder -patient may shower -ELOS/Goals: 10-14 days, supervision with PT, OT, SLP  - team providing positive reinforcement 2. Antithrombotics: -DVT/anticoagulation:Pharmaceutical:Lovenox -antiplatelet therapy: N/A 3. Pain Management:Tylenol prn. 4. Mood:LCSW to follow for evaluation and support. -antipsychotic agents: N/A 5. Neuropsych: This patientis not fullycapable of making decisions on herown behalf. 6. Skin/Wound Care:Routine pressure relief measures. 7. Fluids/Electrolytes/Nutrition:  12/21 -offering supplemental shakes to boost protein   -encouraging po 8. ESBL UTI with bacteremia: On Invanz-->repeat CT abdomen/pelvis 12/24 to help determine course of treatement 9. Question of renal abscess:Leucocytosis resolving on Invanz  12/20 wbc 7k 10. H/o seizures: Breakthrough seizure due to drug interaction--will have pharmacy continue to dose Depakote while on Invanz. Continue Vimpat bid with Depakote 1000/500 mg for now. On ativan taper.  11. Anxiety/agitation:  Continue home dose Klonopin 0.25 mg bid with Seroquel 25 mg bid for now.    -mood fairly up beat and positive today  12/21 anxiety better. More participatory overall with therapy 12. Ongoing cough: CXR clear. Continue Tessalon perles with dulera bid.    13. Chronic constipation: Continue colace bid--monitor as now on antibiotics.  12/20 moving bowels. Encouraging her to eat more consistently    LOS: 5 days A FACE TO FACE EVALUATION WAS PERFORMED  Meredith Staggers 05/23/2020, 11:04 AM

## 2020-05-23 NOTE — Patient Care Conference (Signed)
Inpatient RehabilitationTeam Conference and Plan of Care Update Date: 05/23/2020   Time: 10:43 AM    Patient Name: Maria [name]      Medical Record Number: [id number]  Date of Birth: 05-28-1961 Sex: Female         Room/Bed: 4W03C/4W03C-01 Payor Info: Payor: MEDICARE / Plan: MEDICARE PART A AND B / Product Type: *No Product type* /    Admit Date/Time:  05/18/2020  3:53 PM  Primary Diagnosis:  [facility] Problems: Principal Problem:   Debility    Expected Discharge Date: Expected Discharge Date: 06/01/20  Team Members Present: Physician leading conference: Dr. Alger Simons Care Coodinator Present: Loralee Pacas, LCSWA;Gavynn Duvall Creig Hines, RN, BSN, [place] Nurse Present: Rayne Du, LPN PT Present: Tereasa Coop, PT OT Present: Cherylynn Ridges, OT SLP Present: Weston Anna, SLP PPS Coordinator present : Gunnar Fusi, SLP     Current Status/Progress Goal Weekly Team Focus  Bowel/Bladder   pt is continent/ incontinent of B/B. Last BM 12/20  pt will regain continence, less episodes of incontinence  Q2 hours and PRN   Swallow/Nutrition/ Hydration   Dys. 3 textures with thin liquids, intermittent supervision  Mod I  tolerance of current diet, trials of regular textures   ADL's   Min A overall  Supervision  transfers, activity tolerance, self-care retraining   Mobility   minA bed mobility, transfers. CGA ambulation 200' with RW.  Supervision  balance, gait pattern, transfers   Communication             Safety/Cognition/ Behavioral Observations  Supervision  Supervision  basic orientation, attention and safety awareness   Pain   Pt denies any pain  Pt will remain free of pain  assess pain q shift and PRN   Skin   petechia to abdomen and skin  Pt will be free from skin breakdown/infection.  assess skin q shift and PRN     Discharge Planning:  Pt to d/c to home with 68 y.o. mother who is a retired Therapist, sports and physically able to provide care. Pt sister intends to  transport pt to all medical appointments, and provide assistance as needed.   Team Discussion: Patient is incontinent at night. OT reports patient is ambulating at a min assist with a RW. PT reports patient is participating much better during this admission. SLP reports patient is on a Dys 3, thin and they are doing trials of regular. They are also working on increased orientation. Patient on target to meet rehab goals: yes, patient is participating much better.  *See Care Plan and progress notes for long and short-term goals.   Revisions to Treatment Plan:  MD weaning neuro-sedating medications  Teaching Needs: Continue family education Timed toileting schedule  Current Barriers to Discharge: Decreased caregiver support, Home enviroment access/layout, Incontinence, Medication compliance, Behavior and Nutritional means  Possible Resolutions to Barriers: Continue current medications, timed toileting schedule every 2-3 hrs, offer nutritional supplements, provide emotional support to patient and family.     Medical Summary Current Status: debility after urosepsis, renal abscess. pain improving. behaviorally can wax and wane.  Barriers to Discharge: Medical stability;Behavior   Possible Resolutions to [employer]: ongoing rx of UTI, f/u CT this week. pain control, weaning neuro-sedating meds   Continued Need for Acute Rehabilitation Level of Care: The patient requires daily medical management by a physician with specialized training in physical medicine and rehabilitation for the following reasons: Direction of a multidisciplinary physical rehabilitation program to maximize functional independence : Yes Medical management  of patient stability for increased activity during participation in an intensive rehabilitation regime.: Yes Analysis of laboratory values and/or radiology reports with any subsequent need for medication adjustment and/or medical intervention. : Yes   I attest  that I was present, lead the team conference, and concur with the assessment and plan of the team.   Cristi Loron 05/23/2020, 5:00 PM

## 2020-05-23 NOTE — Progress Notes (Signed)
Patient ID: Maria [name], female   DOB: March 24, 1961, 59 y.o.   MRN: [id number]  SW met with pt in room to provide d/c date 12/30.   SW left message for pt sister Maria [name] [phone] to provide updates from team conference.  Loralee Pacas, MSW, [place] Office: [phone] Cell: [phone] Fax: [phone]

## 2020-05-23 NOTE — Progress Notes (Signed)
Speech Language Pathology Daily Session Note  Patient Details  Name: Maria [name] MRN: [id number] Date of Birth: 12-17-1960  Today's Date: 05/23/2020 SLP Individual Time: 1200-1300 SLP Individual Time Calculation (min): 60 min  Short Term Goals: Week 1: SLP Short Term Goal 1 (Week 1): Pt will consume trials of regular textures and demonstrate efficient masticsation and complete oral clarance without overt s/s of aspiration with Mod I over 2 sessions prior to upgrade. SLP Short Term Goal 2 (Week 1): Pt will demonstrate adequate safety awareness for hospital situation with 85% accuracy min a verbal cues. SLP Short Term Goal 3 (Week 1): Pt will demonstrate orientation x4 using external aids with min A verbal cues. SLP Short Term Goal 4 (Week 1): Pt will sustain attention to functional tasks for 5-6 minutes with min A verbal cues. ss Skilled Therapeutic Interventions:Skilled ST services focused on education and swallow skills. Pt requested to use the bathroom, SLP assisted via stand pivot using the WC. Pt required min A fade to supervision A verbal cues for problem solving/safety awareness (locking breaks, holding on to grab bar...etc.) SLP facilitated PO consumption of regular textured, salad, lunch tray in bed. Pt demonstrated problem solving ability to cut food items (salad), however required occasional assistance to cut food into smaller pieces. Pt's mother entered room and expressed gratitude for salad, per pt's request. In conversation with pt's mother it appeared pt has a particular  diet and she was concerned with her lack of PO intake. Pt demonstrated x1 immediate cough with coughing large bolus, but after education pt cut bites into smaller pieces. Pt demonstrated sustained attention to meal and only distracted by conversation. Pt's mother expressing swallow appeared baseline, however SLP will continue to assess upgrade diet tolerance for a short period of time. SLP provided education to pt  and pt's mother to cut if needed into small bites, liimit distractions and provided intermittent supervision. All questions answered to satisfaction. Pt was left in room with mother, call bell within reach and bed alarm set. SLP recommends to continue skilled services.     Pain Pain Assessment Pain Score: 0-No pain  Therapy/Group: Individual Therapy  Yoniel Arkwright  [facility] 05/23/2020, 2:12 PM

## 2020-05-23 NOTE — Progress Notes (Signed)
Occupational Therapy Session Note  Patient Details  Name: Maria [name] MRN: [id number] Date of Birth: Sep 07, 1960  Today's Date: 05/23/2020 OT Individual Time: 1145-1200 OT Individual Time Calculation (min): 15 min  and Today's Date: 05/23/2020 OT Missed Time: 51 Minutes Missed Time Reason: Patient fatigue  Short Term Goals: Week 1:  OT Short Term Goal 1 (Week 1): Patient will complete shower transfer with min A OT Short Term Goal 2 (Week 1): Pt will complete 2/3 toileting steps OT Short Term Goal 3 (Week 1): Pt will maintain standing balance with BADL tasks with min A  Skilled Therapeutic Interventions/Progress Updates:    Pt greeted semi-reclined in bed asleep upon first attempt to see patient. Patient would not open her eyes, but stated "no" "not right now" when OT attempting to work with patient. OT tried using therapeutic use of self and provided max encouragement with multiple options for participation, but she refused. OT returned after 30 minutes to try again, but pt continued to decline. Pt was agreeable to wash her face in bed with set-up A, but adamantly declined any further participation. OT will follow up per plan of care.   Therapy Documentation Precautions:  Precautions Precautions: Fall Restrictions Weight Bearing Restrictions: No General: General OT Amount of Missed Time: 45 Minutes Pain:  denies pain  Therapy/Group: Individual Therapy  Valma Cava 05/23/2020, 12:41 PM

## 2020-05-23 NOTE — Progress Notes (Signed)
Physical Therapy Session Note  Patient Details  Name: Maria [name] MRN: [id number] Date of Birth: 30-Oct-1960  Today's Date: 05/23/2020 PT Individual Time: 8338-2505 PT Individual Time Calculation (min): 69 min   Short Term Goals: Week 1:  PT Short Term Goal 1 (Week 1): Pt will perform supine<>sit with min assist PT Short Term Goal 2 (Week 1): Pt will perform sit<>stands using LRAD with min assist PT Short Term Goal 3 (Week 1): Pt will perform bed<>chair transfers using LRAD with min assist PT Short Term Goal 4 (Week 1): Pt will ambulate at least 64ft using LRAD with min assist PT Short Term Goal 5 (Week 1): Pt will ascend/descend at least 1 step using LRAD with mod assist  Skilled Therapeutic Interventions/Progress Updates:     Pt received supine in bed and agrees to therapy. No complaint of pain. Supine to sit with supervision and use of bed features. PT cues for logrolling and sequencing. Sit to stand with CGA and ambulatory transfer to toilet with minA. Pt continent of urine and performs pericare independently. Stand step transfer back to [facility] with CGA. WC transport to gym for time management.  Pt performs Nustep activity for strengthening, endurance training, and reciprocal coordination training. Pt copmletes total of 15:00 with SPM > 40 throughout at workload of 5. Pt requires 2-3 brief seated rest breaks during activity. PT cues for proper mechanics and performing activity through full ROM.   Pt ambulates x150', x150' and x200' without AD and with minA at hips. PT cues for increased gait speed, increased stride length, and upright gaze to improve posture and balance. Pt requires rest breaks between each bout.  Pt performs Standing activity with balance blocks game, utilized to challenge activity tolerance and balance with cognitive overlay.   Pt left seated in WC with alarm intact and all needs within reach.  Therapy Documentation Precautions:  Precautions Precautions:  Fall Restrictions Weight Bearing Restrictions: No    Therapy/Group: Individual Therapy  Breck Coons, PT, DPT 05/23/2020, 4:01 PM

## 2020-05-24 ENCOUNTER — Inpatient Hospital Stay (HOSPITAL_COMMUNITY): Payer: Medicare Other | Admitting: Occupational Therapy

## 2020-05-24 ENCOUNTER — Inpatient Hospital Stay (HOSPITAL_COMMUNITY): Payer: Medicare Other

## 2020-05-24 ENCOUNTER — Inpatient Hospital Stay (HOSPITAL_COMMUNITY): Payer: Medicare Other | Admitting: Speech Pathology

## 2020-05-24 NOTE — Progress Notes (Signed)
Physical Therapy Session Note  Patient Details  Name: Maria [name] MRN: [id number] Date of Birth: Jun 22, 1960  Today's Date: 05/24/2020 PT Individual Time: 5427-0623 PT Individual Time Calculation (min): 45 min   Short Term Goals: Week 1:  PT Short Term Goal 1 (Week 1): Pt will perform supine<>sit with min assist PT Short Term Goal 2 (Week 1): Pt will perform sit<>stands using LRAD with min assist PT Short Term Goal 3 (Week 1): Pt will perform bed<>chair transfers using LRAD with min assist PT Short Term Goal 4 (Week 1): Pt will ambulate at least 44ft using LRAD with min assist PT Short Term Goal 5 (Week 1): Pt will ascend/descend at least 1 step using LRAD with mod assist  Skilled Therapeutic Interventions/Progress Updates:     Pt received supine in bed. Pt says she needs to rest and does not want to participate in therapy. With gentle encouragement, pt agreeable to therapy if PT allows her to rest for a few minutes. Pt misses initial 30 minutes of session due to fatigue.   Upon return, pt agreeable to therapy and reports headache. PT provides rest breaks as needed to manage pain. Supine to sit with supervision for safety. Sit to stand with CGA and ambulatory transfer to toilet with minA. Pt is independent with pericare and requires CGA for transfer back to WC.  Pt ambulates to dayroom, x120', with HHA and minA from PT. PT provides cues for upright gaze to improve posture and balance, with manual facilitation of lateral weight shift, hip rotation, and multimodal cues for gait pattern and increasing gait speed.  Pt performs Nustep for strength, endurance training, and reciprocal coordination. Pt completes x12:00 total at workload of 5 with SPM>40. Pt takes several extended rest breaks and requires PT cues for full ROM and body mechanics.   Pt ambulates 120' back to room with no AD and with minA at hips. Pt left seated in WC with alarm intact and all needs within reach.  Therapy  Documentation Precautions:  Precautions Precautions: Fall Restrictions Weight Bearing Restrictions: No    Therapy/Group: Individual Therapy  Breck Coons, PT, DPT 05/24/2020, 3:55 PM

## 2020-05-24 NOTE — Progress Notes (Signed)
PHYSICAL MEDICINE & REHABILITATION PROGRESS NOTE   Subjective/Complaints: No complaints today. Tired- didn't sleep well.   ROS: Limited due to cognitive/behavioral   Objective:   No results found. Recent Labs    05/22/20 0635  WBC 7.5  HGB 9.7*  HCT 31.7*  PLT 322   Recent Labs    05/22/20 0635  NA 140  K 4.0  CL 103  CO2 23  GLUCOSE 92  BUN 11  CREATININE 0.81  CALCIUM 9.5    Intake/Output Summary (Last 24 hours) at 05/24/2020 1308 Last data filed at 05/24/2020 0825 Gross per 24 hour  Intake 480 ml  Output --  Net 480 ml     Pressure Injury 04/22/20 Back Medial;Right Stage 2 -  Partial thickness loss of dermis presenting as a shallow open injury with a red, pink wound bed without slough. Medical device pressure injury-shape of lopez valve (Active)  04/22/20 0800  Location: Back  Location Orientation: Medial;Right  Staging: Stage 2 -  Partial thickness loss of dermis presenting as a shallow open injury with a red, pink wound bed without slough.  Wound Description (Comments): Medical device pressure injury-shape of lopez valve  Present on Admission: No    Physical Exam: Vital Signs Blood pressure 128/71, pulse 79, temperature 98.2 F (36.8 C), resp. rate 16, height 5\' 6"  (1.676 m), weight 57.5 kg, SpO2 100 %.  Gen: no distress, normal appearing HEENT: oral mucosa pink and moist, NCAT Cardio: Reg rate Chest: normal effort, normal rate of breathing Abd: soft, non-distended Ext: no edema Skin: intact Psych: flat Skin: Clean and intact without signs of breakdown Neuro: fair insight and awareness.. Cranial nerves 2-12 are intact. Sensory exam is normal. Reflexes are 2+ in all 4's. Fine motor coordination is intact. No tremors. Motor function is grossly 4- to 4/5 in uppers and 3+ to 4/5 prox to distal in lowers..  Musculoskeletal: Full ROM, No pain with AROM or PROM in the neck, trunk, or extremities. Posture appropriate    Assessment/Plan: 1.  Functional deficits which require 3+ hours per day of interdisciplinary therapy in a comprehensive inpatient rehab setting.  Physiatrist is providing close team supervision and 24 hour management of active medical problems listed below.  Physiatrist and rehab team continue to assess barriers to discharge/monitor patient progress toward functional and medical goals  Care Tool:  Bathing    Body parts bathed by patient: Right arm,Left arm,Chest,Abdomen,Front perineal area,Buttocks,Right upper leg,Left upper leg,Face   Body parts bathed by helper: Right lower leg,Left lower leg     Bathing assist Assist Level: Moderate Assistance - Patient 50 - 74%     Upper Body Dressing/Undressing Upper body dressing   What is the patient wearing?: Button up shirt    Upper body assist Assist Level: Minimal Assistance - Patient > 75%    Lower Body Dressing/Undressing Lower body dressing      What is the patient wearing?: Pants,Incontinence brief     Lower body assist Assist for lower body dressing: Minimal Assistance - Patient > 75%     Toileting Toileting    Toileting assist Assist for toileting: Minimal Assistance - Patient > 75%     Transfers Chair/bed transfer  Transfers assist     Chair/bed transfer assist level: Contact Guard/Touching assist     Locomotion Ambulation   Ambulation assist      Assist level: Minimal Assistance - Patient > 75% Assistive device: No Device Max distance: 200'   Walk 10 feet activity  Assist     Assist level: Minimal Assistance - Patient > 75% Assistive device: No Device   Walk 50 feet activity   Assist    Assist level: Minimal Assistance - Patient > 75% Assistive device: No Device    Walk 150 feet activity   Assist Walk 150 feet activity did not occur: Safety/medical concerns  Assist level: Minimal Assistance - Patient > 75% Assistive device: No Device    Walk 10 feet on uneven surface  activity   Assist Walk 10  feet on uneven surfaces activity did not occur: Safety/medical concerns   Assist level: Minimal Assistance - Patient > 75% Assistive device: Hand held assist   Wheelchair     Assist Will patient use wheelchair at discharge?: No             Wheelchair 50 feet with 2 turns activity    Assist            Wheelchair 150 feet activity     Assist          Blood pressure 128/71, pulse 79, temperature 98.2 F (36.8 C), resp. rate 16, height 5\' 6"  (1.676 m), weight 57.5 kg, SpO2 100 %.  Medical Problem List and Plan: 1.Functional and mobility deficitssecondary to debility after multiple medical issues, seizure disorder -patient may shower -ELOS/Goals: 10-14 days, supervision with PT, OT, SLP  - team providing positive reinforcement  -Continue CIR 2. Antithrombotics: -DVT/anticoagulation:Pharmaceutical:Lovenox -antiplatelet therapy: N/A 3. Pain Management:Pain is currently well controlled- continue tylenol prn 4. Mood:LCSW to follow for evaluation and support. -antipsychotic agents: N/A 5. Neuropsych: This patientis not fullycapable of making decisions on herown behalf. 6. Skin/Wound Care:Routine pressure relief measures. 7. Fluids/Electrolytes/Nutrition:  12/21 -offering supplemental shakes to boost protein   -encouraging po 8. ESBL UTI with bacteremia: On Invanz-->repeat CT abdomen/pelvis 12/24 to help determine course of treatement 9. Question of renal abscess:Leucocytosis resolving on Invanz  12/20 wbc 7k 10. H/o seizures: Breakthrough seizure due to drug interaction--will have pharmacy continue to dose Depakote while on Invanz. Continue Vimpat bid with Depakote 1000/500 mg for now. On ativan taper.  11. Anxiety/agitation: Continue home dose Klonopin 0.25 mg bid with Seroquel 25 mg bid for now.    -mood fairly up beat and positive today  12/21-22 anxiety better- continue current regimen. More  participatory overall with therapy 12. Ongoing cough: CXR clear. Continue Tessalon perles with dulera bid.    13. Chronic constipation: Continue colace bid--monitor as now on antibiotics.  12/20 moving bowels. Encouraging her to eat more consistently    LOS: 6 days A FACE TO FACE EVALUATION WAS PERFORMED  Martha Clan P Steffani Dionisio 05/24/2020, 1:08 PM

## 2020-05-24 NOTE — Progress Notes (Signed)
Occupational Therapy Session Note  Patient Details  Name: Maria [name] MRN: [id number] Date of Birth: 01-24-1961  Today's Date: 05/24/2020 OT Individual Time: 1345-1440 OT Individual Time Calculation (min): 55 min    Short Term Goals: Week 1:  OT Short Term Goal 1 (Week 1): Patient will complete shower transfer with min A OT Short Term Goal 2 (Week 1): Pt will complete 2/3 toileting steps OT Short Term Goal 3 (Week 1): Pt will maintain standing balance with BADL tasks with min A   Skilled Therapeutic Interventions/Progress Updates:    Pt greeted at time of session sitting up in wheelchair requiring encouragement to participate in OT session. No pain throughout. Pt needing to use the bathroom at beginning of session, walking to/from wheelchair <> bathroom with hand held assist and Min A at hip fading to CGA with toilet transfer, CGA for clothing management and hygiene for thoroughness d/t BM. Transported to/from gym dependently for time management and trialed multiple BITS activities in standing but pt stating "I dont like these." Trialed SCIFIT seated both FWD and backward and able to complete 2 minutes total but continued to say she did not like this activity. Performed seated block building activity with visual scanning/processing, recognizing discrepencies, copying images, and following directions. Pt did participate in this task for 4-5 rounds but did tire and transported back to room in w/c same as above. Attempted to have pt sit up in wheelchair for OOB tolerance, pt starting to become agitated and transferred herself to bed CGA with poor frustration tolerance noted. Sit to supine supervision. Alarm on call bell in reach.    Therapy Documentation Precautions:  Precautions Precautions: Fall Restrictions Weight Bearing Restrictions: No     Therapy/Group: Individual Therapy  Viona Gilmore 05/24/2020, 2:46 PM

## 2020-05-24 NOTE — Progress Notes (Signed)
Speech Language Pathology Daily Session Note  Patient Details  Name: Maria [name] MRN: [id number] Date of Birth: 1960-06-11  Today's Date: 05/24/2020 SLP Individual Time: 0730-0825 SLP Individual Time Calculation (min): 55 min  Short Term Goals: Week 1: SLP Short Term Goal 1 (Week 1): Pt will consume trials of regular textures and demonstrate efficient masticsation and complete oral clarance without overt s/s of aspiration with Mod I over 2 sessions prior to upgrade. SLP Short Term Goal 2 (Week 1): Pt will demonstrate adequate safety awareness for hospital situation with 85% accuracy min a verbal cues. SLP Short Term Goal 3 (Week 1): Pt will demonstrate orientation x4 using external aids with min A verbal cues. SLP Short Term Goal 4 (Week 1): Pt will sustain attention to functional tasks for 5-6 minutes with min A verbal cues.  Skilled Therapeutic Interventions: Skilled treatment session focused on cognitive and dysphagia goals. Upon arrival, patient was slow to arouse and required extra time to initiate self-feeding. Patient consumed her breakfast meal of regular textures with thin liquids without overt s/s of aspiration and was overall Mod I for use of swallowing compensatory strategies. Patient demonstrated sustained attention to self-feeding for ~30 minutes with Mod I. Patient requested to use the bathroom and donned her socks with extra time. Patient demonstrated appropriate safety awareness during transfers with overall Mod I and was continent of bladder. Patient transferred back to bed at end of session and left with alarm on and all needs within reach. Continue with current plan of care.      Pain Pain Assessment Pain Scale: 0-10 Pain Score: 0-No pain  Therapy/Group: Individual Therapy  Sadik Piascik 05/24/2020, 12:00 PM

## 2020-05-25 ENCOUNTER — Inpatient Hospital Stay (HOSPITAL_COMMUNITY): Payer: Medicare Other | Admitting: Occupational Therapy

## 2020-05-25 ENCOUNTER — Inpatient Hospital Stay (HOSPITAL_COMMUNITY): Payer: Medicare Other

## 2020-05-25 ENCOUNTER — Inpatient Hospital Stay (HOSPITAL_COMMUNITY): Payer: Medicare Other | Admitting: Speech Pathology

## 2020-05-25 LAB — VALPROIC ACID LEVEL: Valproic Acid Lvl: 59 ug/mL (ref 50.0–100.0)

## 2020-05-25 NOTE — Progress Notes (Signed)
Occupational Therapy Session Note  Patient Details  Name: Maria [name] MRN: [id number] Date of Birth: [birth year]  Today's Date: 05/25/2020 OT Individual Time: 4417-1278 OT Individual Time Calculation (min): 26 min    Short Term Goals: Week 1:  OT Short Term Goal 1 (Week 1): Patient will complete shower transfer with min A OT Short Term Goal 1 - Progress (Week 1): Met OT Short Term Goal 2 (Week 1): Pt will complete 2/3 toileting steps OT Short Term Goal 2 - Progress (Week 1): Met OT Short Term Goal 3 (Week 1): Pt will maintain standing balance with BADL tasks with min A OT Short Term Goal 3 - Progress (Week 1): Met Week 2:  OT Short Term Goal 1 (Week 2): LTG=STG 2/2 ELOS  Skilled Therapeutic Interventions/Progress Updates:    Pt greeted at time of session reclined in bed with mother present who remained throughout session. Pt's mom had questions about her CLOF which were answered. Pt needing to use the bathroom, supine to sit Supervision and walked to bathroom hand held assist, transferred to toilet CGA and doffed clothing in same manner. Donned new brief with assist as pt confused by straps but CGA for donning new pants. Donned shirt Supervision, mother present for dressing tasks as she wanted to see how the pt was doing with self care. Pt walked bathroom > sink > bed with hand held assist/CGA. Sit to supine supervision and alarm on, call bell in reach.   Therapy Documentation Precautions:  Precautions Precautions: Fall Restrictions Weight Bearing Restrictions: No     Therapy/Group: Individual Therapy  Viona Gilmore 05/25/2020, 4:02 PM

## 2020-05-25 NOTE — Progress Notes (Signed)
[place] [place] PHYSICAL MEDICINE & REHABILITATION PROGRESS NOTE   Subjective/Complaints:  Pt reports no issues- wants to take a rest after therapy this AM  ROS: limited due to cognition  Objective:   No results found. No results for input(s): WBC, HGB, HCT, PLT in the last 72 hours. No results for input(s): NA, K, CL, CO2, GLUCOSE, BUN, CREATININE, CALCIUM in the last 72 hours.  Intake/Output Summary (Last 24 hours) at 05/25/2020 0945 Last data filed at 05/24/2020 1830 Gross per 24 hour  Intake 480 ml  Output --  Net 480 ml     Pressure Injury 04/22/20 Back Medial;Right Stage 2 -  Partial thickness loss of dermis presenting as a shallow open injury with a red, pink wound bed without slough. Medical device pressure injury-shape of lopez valve (Active)  04/22/20 0800  Location: Back  Location Orientation: Medial;Right  Staging: Stage 2 -  Partial thickness loss of dermis presenting as a shallow open injury with a red, pink wound bed without slough.  Wound Description (Comments): Medical device pressure injury-shape of lopez valve  Present on Admission: No    Physical Exam: Vital Signs Blood pressure 109/60, pulse 67, temperature 98.3 F (36.8 C), resp. rate 20, height 5\' 6"  (1.676 m), weight 57 kg, SpO2 96 %.  Gen: no distress, normal appearing- sitting up on nustep with PT, NAD HEENT: oral mucosa pink and moist, NCAT Cardio: RRR Chest: CTA B/L- no W/R/R- good air movement Abd: Soft, NT, ND, (+)BS  Ext: no edema Skin: intact Psych: flat affect- no change Skin: Clean and intact without signs of breakdown Neuro: fair insight and awareness.. Cranial nerves 2-12 are intact. Sensory exam is normal. Reflexes are 2+ in all 4's. Fine motor coordination is intact. No tremors. Motor function is grossly 4- to 4/5 in uppers and 3+ to 4/5 prox to distal in lowers..  Musculoskeletal: Full ROM, No pain with AROM or PROM in the neck, trunk, or extremities. Posture appropriate     Assessment/Plan: 1. Functional deficits which require 3+ hours per day of interdisciplinary therapy in a comprehensive inpatient rehab setting.  Physiatrist is providing close team supervision and 24 hour management of active medical problems listed below.  Physiatrist and rehab team continue to assess barriers to discharge/monitor patient progress toward functional and medical goals  Care Tool:  Bathing    Body parts bathed by patient: Right arm,Left arm,Chest,Abdomen,Front perineal area,Buttocks,Right upper leg,Left upper leg,Face   Body parts bathed by helper: Right lower leg,Left lower leg     Bathing assist Assist Level: Moderate Assistance - Patient 50 - 74%     Upper Body Dressing/Undressing Upper body dressing   What is the patient wearing?: Button up shirt    Upper body assist Assist Level: Minimal Assistance - Patient > 75%    Lower Body Dressing/Undressing Lower body dressing      What is the patient wearing?: Pants,Incontinence brief     Lower body assist Assist for lower body dressing: Minimal Assistance - Patient > 75%     Toileting Toileting    Toileting assist Assist for toileting: Contact Guard/Touching assist     Transfers Chair/bed transfer  Transfers assist     Chair/bed transfer assist level: Contact Guard/Touching assist     Locomotion Ambulation   Ambulation assist      Assist level: Minimal Assistance - Patient > 75% Assistive device: No Device Max distance: 120'   Walk 10 feet activity   Assist     Assist level:  Minimal Assistance - Patient > 75% Assistive device: No Device   Walk 50 feet activity   Assist    Assist level: Minimal Assistance - Patient > 75% Assistive device: No Device    Walk 150 feet activity   Assist Walk 150 feet activity did not occur: Safety/medical concerns  Assist level: Minimal Assistance - Patient > 75% Assistive device: No Device    Walk 10 feet on uneven surface   activity   Assist Walk 10 feet on uneven surfaces activity did not occur: Safety/medical concerns   Assist level: Minimal Assistance - Patient > 75% Assistive device: Hand held assist   Wheelchair     Assist Will patient use wheelchair at discharge?: No             Wheelchair 50 feet with 2 turns activity    Assist            Wheelchair 150 feet activity     Assist          Blood pressure 109/60, pulse 67, temperature 98.3 F (36.8 C), resp. rate 20, height 5\' 6"  (1.676 m), weight 57 kg, SpO2 96 %.  Medical Problem List and Plan: 1.Functional and mobility deficitssecondary to debility after multiple medical issues, seizure disorder -patient may shower -ELOS/Goals: 10-14 days, supervision with PT, OT, SLP  - team providing positive reinforcement  -Continue CIR 2. Antithrombotics: -DVT/anticoagulation:Pharmaceutical:Lovenox -antiplatelet therapy: N/A 3. Pain Management:Pain is currently well controlled- continue tylenol prn 4. Mood:LCSW to follow for evaluation and support. -antipsychotic agents: N/A 5. Neuropsych: This patientis not fullycapable of making decisions on herown behalf. 6. Skin/Wound Care:Routine pressure relief measures. 7. Fluids/Electrolytes/Nutrition:  12/21 -offering supplemental shakes to boost protein   -encouraging po 8. ESBL UTI with bacteremia: On Invanz-->repeat CT abdomen/pelvis 12/24 to help determine course of treatement 9. Question of renal abscess:Leucocytosis resolving on Invanz  12/20 wbc 7k 10. H/o seizures: Breakthrough seizure due to drug interaction--will have pharmacy continue to dose Depakote while on Invanz. Continue Vimpat bid with Depakote 1000/500 mg for now. On ativan taper.  11. Anxiety/agitation: Continue home dose Klonopin 0.25 mg bid with Seroquel 25 mg bid for now.    -mood fairly up beat and positive today  12/21-22 anxiety better-  continue current regimen. More participatory overall with therapy 12. Ongoing cough: CXR clear. Continue Tessalon perles with dulera bid.   12/23- no cough this AM- con't regimen 13. Chronic constipation: Continue colace bid--monitor as now on antibiotics.  12/20 moving bowels. Encouraging her to eat more consistently 14. Sleepy  12/23- wants naps after AM therapy- will try and plan for this.     LOS: 7 days A FACE TO FACE EVALUATION WAS PERFORMED  Fabianna Keats 05/25/2020, 9:45 AM

## 2020-05-25 NOTE — Progress Notes (Addendum)
Speech Language Pathology Daily Session Note  Patient Details  Name: Maria [name] MRN: [id number] Date of Birth: 1960-10-26  Today's Date: 05/25/2020 SLP Individual Time: 62-1445 SLP Individual Time Calculation (min): 60 min  Short Term Goals: Week 1: SLP Short Term Goal 1 (Week 1): Pt will consume trials of regular textures and demonstrate efficient masticsation and complete oral clarance without overt s/s of aspiration with Mod I over 2 sessions prior to upgrade. SLP Short Term Goal 2 (Week 1): Pt will demonstrate adequate safety awareness for hospital situation with 85% accuracy min a verbal cues. SLP Short Term Goal 3 (Week 1): Pt will demonstrate orientation x4 using external aids with min A verbal cues. SLP Short Term Goal 4 (Week 1): Pt will sustain attention to functional tasks for 5-6 minutes with min A verbal cues.  Skilled Therapeutic Interventions:  Pt was seen for skilled ST targeting cognitive and dysphagia goals.  Pt was sleeping upon therapist's arrival but awoke easily to voice or light touch.  Pt reported that she wasn't feeling well, that she had a headache and didn't feel like doing too much therapy today.  Min assist verbal cues were needed to problem solve use of call bell to ask for pain meds from nursing.  Pt had not eaten much of her lunch but was agreeable to eating a snack of regular textures and thin liquids.  She consumed presentations of her currently prescribed diet with mod I use of swallowing precautions and no overt s/s of aspiration with solids or liquids.  She briefly became restless at one point during session, insisting that she wanted to go home "right now."  SLP provided support and encouragement for ongoing participation in treatment which seemed to redirect pt.  Pt asked to go to the bathroom appropriately and demonstrated appropriate safety awareness when walking to and from bathroom and while adjusting her bed linens.  Pt was left in bed with bed alarm  set with call bell within reach.  Continue per current plan of care.    Pain Pain Assessment Pain Scale: Faces Faces Pain Scale: Hurts a little bit Pain Location: Head Pain Descriptors / Indicators: Headache Pain Intervention(s): RN made aware  Therapy/Group: Individual Therapy  Nigel Ericsson, Selinda Orion 05/25/2020, 4:02 PM

## 2020-05-25 NOTE — Progress Notes (Signed)
Patient ID: Maria [name], female   DOB: 09/30/1960, 59 y.o.   MRN: [id number]  SW spoke with pt sister Maria [name] [phone]) to provide updates from team conference,and d/c date 12/30. Her and mother will come in for family edu on Monday (12/27) 2pm-4pm.   Loralee Pacas, MSW, [place] Office: [phone] Cell: [phone] Fax: [phone]

## 2020-05-25 NOTE — Progress Notes (Signed)
Pharmacy Consult  Pharmacy consulted for divalproex (Depakote) management. Patient on PTA at dose of 500 mg BID- which was continued as inpatient. Has history of seizure disorder - on divalproex, vimpat, and clonazepam/valium PTA. Per outpatient note on 11/25/2019 the last seizure was about 4 years ago and she she has had been on divalproex, vimpat, and clonazepan since at least 11/2015.    She is on ertapenem for ESBL E Coli  Bactteria. Of note, significant interaction between ertapenem and divalproex - concurrent use may result in decrease valproic acid concentrations. She was noted with a focal seizure on the evening on 12/14.  Valproic acid levels: 12/6: 51 12/12: < 10   12/16: < 10 12/19: 23 12/23: 59  Patient has been on 1000 mg BID since 12/19. Level today is now therapeutic 4d s/p increase (goal 50-100 mcg/mL).  Plan Continue Depakote 1000 mg PO BID F/u valproic acid level in 4 days to confirm Monitor ertapenem LOT and ID recs  Bertis Ruddy, PharmD Clinical Pharmacist Please check AMION for all [facility] numbers 05/25/2020 7:23 AM

## 2020-05-25 NOTE — Progress Notes (Signed)
Physical Therapy Weekly Progress Note  Patient Details  Name: Maria [name] MRN: [id number] Date of Birth: 1960-11-12  Beginning of progress report period: May 19, 2020 End of progress report period: May 25, 2020   Today's Date: 05/25/2020 PT Individual Time: 0803-0859 PT Individual Time Calculation (min): 56 min   Patient has met 5 of 5 short term goals.  Pt is progressing well toward mobility goals, improving independence in all areas of mobility. Pt performing bed mobility consistently at supervision level, sit to stand transfers with CGA, and ambulation without AD with minA/CGA. Pt continues to require encouragement to participate and can be very self-limiting, but is likely approaching baseline level of functional mobility.  Patient continues to demonstrate the following deficits muscle weakness, decreased cardiorespiratoy endurance and decreased sitting balance, decreased standing balance and decreased balance strategies and therefore will continue to benefit from skilled PT intervention to increase functional independence with mobility.  Patient progressing toward long term goals..  Continue plan of care.  PT Short Term Goals Week 1:  PT Short Term Goal 1 (Week 1): Pt will perform supine<>sit with min assist PT Short Term Goal 1 - Progress (Week 1): Met PT Short Term Goal 2 (Week 1): Pt will perform sit<>stands using LRAD with min assist PT Short Term Goal 2 - Progress (Week 1): Met PT Short Term Goal 3 (Week 1): Pt will perform bed<>chair transfers using LRAD with min assist PT Short Term Goal 3 - Progress (Week 1): Met PT Short Term Goal 4 (Week 1): Pt will ambulate at least 35f using LRAD with min assist PT Short Term Goal 4 - Progress (Week 1): Met PT Short Term Goal 5 (Week 1): Pt will ascend/descend at least 1 step using LRAD with mod assist PT Short Term Goal 5 - Progress (Week 1): Met Week 2:  PT Short Term Goal 1 (Week 2): STGs= LTGs  Skilled Therapeutic  Interventions/Progress Updates:  Community reintegration;Ambulation/gait training;DME/adaptive equipment instruction;Neuromuscular re-education;Psychosocial support;Stair training;UE/LE Strength taining/ROM;Balance/vestibular training;Discharge planning;Functional electrical stimulation;Pain management;Skin care/wound management;Therapeutic Activities;UE/LE Coordination activities;Cognitive remediation/compensation;Disease management/prevention;Functional mobility training;Patient/family education;Splinting/orthotics;Therapeutic Exercise;Visual/perceptual remediation/compensation   Pt received supine in bed. Reports that she does not feel like participating in therapy today but is agreeable following gentle encouragement from PT. Supine to sit with supervision for safety. Sit to stand with CGA and ambulatory transfer to toilet with CGA. Pt performs pericare with set-up assistance. Stand step [facility] to [facility]. Pt performs NMR for standing balance and activity tolerance at high low table. Pt stands on airex mat with CGA and participates in drawing activity to challenge fine motor skills and provide multitasking activity while pt works on standing balance. Pt takes several seated rest breaks. Pt performs x4 steps with bilateral hand rails and minA at hips, with cues on sequencing and foot placement. Multiple sit to stands during session require CGA. Pt left seated in WC with alarm intact and all needs within reach.  Therapy Documentation Precautions:  Precautions Precautions: Fall Restrictions Weight Bearing Restrictions: No  Therapy/Group: Individual Therapy  WBreck Coons PT, DPT 05/25/2020, 4:39 PM

## 2020-05-25 NOTE — Progress Notes (Signed)
Occupational Therapy Weekly Progress Note  Patient Details  Name: Maria [name] MRN: [id number] Date of Birth: [birth year]  Beginning of progress report period: May 19, 2020 End of progress report period: May 25, 2020  Today's Date: 05/25/2020 OT Individual Time: 1003-1100 OT Individual Time Calculation (min): 57 min    Patient has met 3 of 3 short term goals.  Pt is making steady progress with OT treatments at this time. She is able to ambulate with min/CGA HHA to perform her BADL tasks. She needs CGA/min A for all of her BADL tasks. Her biggest limitation is low activity tolerance. Continue current POC.  Patient continues to demonstrate the following deficits: muscle weakness, decreased cardiorespiratoy endurance and decreased standing balance and decreased postural control and therefore will continue to benefit from skilled OT intervention to enhance overall performance with BADL and Reduce care partner burden.  Patient progressing toward long term goals..  Continue plan of care.  OT Short Term Goals Week 1:  OT Short Term Goal 1 (Week 1): Patient will complete shower transfer with min A OT Short Term Goal 1 - Progress (Week 1): Met OT Short Term Goal 2 (Week 1): Pt will complete 2/3 toileting steps OT Short Term Goal 2 - Progress (Week 1): Met OT Short Term Goal 3 (Week 1): Pt will maintain standing balance with BADL tasks with min A OT Short Term Goal 3 - Progress (Week 1): Met Week 2:  OT Short Term Goal 1 (Week 2): LTG=STG 2/2 ELOS  Skilled Therapeutic Interventions/Progress Updates:    Patient greeted seated in wc asleep. Pt easy to wake. Pt declined to participate in any BADL tasks. She did not want to shower or change clothes, despite encouragement from OT. Pt continued to decline any participation, but with encouragement, pt agreeable to go to the gym. Pt completed 10 minutes on [employer] arm bike with multiple rest breaks and max cues to maintain B UE grasp on  handles. This took much more than reasonable amount of time 2/2 pt frequently stopping. Functional ambulation and visual scanning with scavenger hunt to locate items in the gym. Min HHA and min cues to locate items. Pt returned to room and needed to go to the bathroom. Min HHA ambulation in the room to the bathroom. Pt voided bladder and completed 3/3 toileting steps with supervision. Standing grooming tasks with close supervision. Pt ambulated back to bed and left semi-reclined in bed with bed alarm on, call bell in reach, and needs met.   Therapy Documentation Precautions:  Precautions Precautions: Fall Restrictions Weight Bearing Restrictions: No Pain: Pain Assessment Pain Scale: 0-10 Pain Score: 0-No pain   Therapy/Group: Individual Therapy  Valma Cava 05/25/2020, 10:54 AM

## 2020-05-26 ENCOUNTER — Inpatient Hospital Stay (HOSPITAL_COMMUNITY): Payer: Medicare Other | Admitting: Physical Therapy

## 2020-05-26 ENCOUNTER — Inpatient Hospital Stay (HOSPITAL_COMMUNITY): Payer: Medicare Other | Admitting: Occupational Therapy

## 2020-05-26 ENCOUNTER — Inpatient Hospital Stay (HOSPITAL_COMMUNITY): Payer: Medicare Other

## 2020-05-26 ENCOUNTER — Inpatient Hospital Stay (HOSPITAL_COMMUNITY): Payer: Medicare Other | Admitting: Speech Pathology

## 2020-05-26 MED ORDER — IOHEXOL 350 MG/ML SOLN
80.0000 mL | Freq: Once | INTRAVENOUS | Status: AC | PRN
  Administered 2020-05-26: 80 mL via INTRAVENOUS

## 2020-05-26 NOTE — Progress Notes (Signed)
Speech Language Pathology Weekly Progress and Session Note  Patient Details  Name: Maria [name] MRN: [id number] Date of Birth: May 07, 1961  Beginning of progress report period: 05/19/2020 End of progress report period: 05/26/2020  Today's Date: 05/26/2020 SLP Individual Time:  -     Short Term Goals: Week 2: SLP Short Term Goal 1 (Week 2): Patient will consume current diet with minimal overt s/s of aspiration with Supervision verbal cues for use of swallowing compensatory strategies. SLP Short Term Goal 1 - Progress (Week 2): Met SLP Short Term Goal 2 (Week 2): Patient will consume trials of regular textures and demonstrate efficient mastication with complete oral clearance without overt s/s of aspiration with supervision verbal cues over 2 sessions prior to upgrade. SLP Short Term Goal 2 - Progress (Week 2): Met SLP Short Term Goal 3 (Week 2): Patient will demonstrate orientation to place, month and situation with Min verbal cues. SLP Short Term Goal 3 - Progress (Week 2): Met SLP Short Term Goal 4 (Week 2): Patient will demonstrate sustained attention ot functional tasks for 30 minutes with Min verbal cues for redirection. SLP Short Term Goal 4 - Progress (Week 2): Met SLP Short Term Goal 5 (Week 2): Patient will utilize call bell to request assistance with Min verbal cues in 75% of opportunities. SLP Short Term Goal 5 - Progress (Week 2): Progressing toward goal    New Short Term Goals: Week 3: SLP Short Term Goal 1 (Week 3): STG=LTG's (ELOS 12/30 d/c)  Weekly Progress Updates:   Patient made good progress and met 4/5 STG's. She is currently tolerating a Regular texture solids, thin liquids diet with supervision assist. She is near baseline cognitive functioning and anticipate LTG's to be met at discharge.   Intensity: Minumum of 1-2 x/day, 30 to 90 minutes Frequency: 3 to 5 out of 7 days Duration/Length of Stay: 06/01/2020 Treatment/Interventions: Cognitive  remediation/compensation;Patient/family education;Internal/external aids;Dysphagia/aspiration precaution training;Therapeutic Activities;Environmental controls;Cueing hierarchy;Functional tasks   Sonia Baller, MA, CCC-SLP Speech Therapy

## 2020-05-26 NOTE — Progress Notes (Signed)
Speech Language Pathology Daily Session Note  Patient Details  Name: Maria [name] MRN: [id number] Date of Birth: 06-10-1960  Today's Date: 05/26/2020 SLP Individual Time: 0930-0950 SLP Individual Time Calculation (min): 20 min  Short Term Goals: Week 1: SLP Short Term Goal 1 (Week 1): Pt will consume trials of regular textures and demonstrate efficient masticsation and complete oral clarance without overt s/s of aspiration with Mod I over 2 sessions prior to upgrade. SLP Short Term Goal 2 (Week 1): Pt will demonstrate adequate safety awareness for hospital situation with 85% accuracy min a verbal cues. SLP Short Term Goal 3 (Week 1): Pt will demonstrate orientation x4 using external aids with min A verbal cues. SLP Short Term Goal 4 (Week 1): Pt will sustain attention to functional tasks for 5-6 minutes with min A verbal cues.  Skilled Therapeutic Interventions:   Patient seen at bedside for skilled ST session focusing on cognitive goals. Patient lying in bed and asleep but able to be aroused for brief periods. She stated "Im tired", "I walked" (confirmed by PT that she did walk during session earlier this morning). She requested to use the bathroom but then was not able to maintain alertness to initiate moving to edge of bed, despite frequent tactile and verbal cues from SLP. SLP plans to try to see patient today with lunch meal to address dysphagia goals.  Patient continues to benefit from skilled SLP intervention to maximize cognitive-linguistic and swallow function prior to discharge.  Pain Pain Assessment Pain Scale: Faces Pain Score: 0-No pain Faces Pain Scale: No hurt  Therapy/Group: Individual Therapy  Sonia Baller, MA, CCC-SLP Speech Therapy

## 2020-05-26 NOTE — Progress Notes (Signed)
Physical Therapy Session Note  Patient Details  Name: Maria [name] MRN: [id number] Date of Birth: 07-25-1960  Today's Date: 05/26/2020 PT Individual Time: 0700-0800 PT Individual Time Calculation (min): 60 min   Short Term Goals: Week 1:  PT Short Term Goal 1 (Week 1): Pt will perform supine<>sit with min assist PT Short Term Goal 1 - Progress (Week 1): Met PT Short Term Goal 2 (Week 1): Pt will perform sit<>stands using LRAD with min assist PT Short Term Goal 2 - Progress (Week 1): Met PT Short Term Goal 3 (Week 1): Pt will perform bed<>chair transfers using LRAD with min assist PT Short Term Goal 3 - Progress (Week 1): Met PT Short Term Goal 4 (Week 1): Pt will ambulate at least 40f using LRAD with min assist PT Short Term Goal 4 - Progress (Week 1): Met PT Short Term Goal 5 (Week 1): Pt will ascend/descend at least 1 step using LRAD with mod assist PT Short Term Goal 5 - Progress (Week 1): Met Week 2:  PT Short Term Goal 1 (Week 2): STGs= LTGs  Skilled Therapeutic Interventions/Progress Updates:    pt received in bed long sitting and awake. Pt reported she was unable to do therapy this morning and wanted her nurse to tell her she was ok to participate. Nursing entered room to confirm pt was appropriate for therapy and pt continued to deny therapy however with max encouragement, pt then agreeable to transfer OOB. Pt directed in Sit to stand from bed supervision, directed in gait training out of room supervision without AD for 20' however pt distracted and returned 20' back to bed and sat down, nursing present at this time for medication pass and pt sat at supervision without external support to take medications with nursing present. Pt then directed in room ambulation for gathering new cloths from drawers, changing pants and shirt and socks at supervision, then returning to drawers to place folded clothes all at supervision. Pt then directed in Sit to stand supervision for gait in room  for 55' total with multiple turns, no AD supervision with noted decreased stride length and cadence, with forward head carriage. Pt then reported she did not want to sit in recliner or WC and only agreeable to eating breakfast at EOB, supervision. Pt supervision for this with VC for problem solving required throughout and environment setup to limit distractions. Pt then directed in sit>supine supervision and left in bed, alarm set, All needs in reach and in good condition. Call light in hand.    Therapy Documentation Precautions:  Precautions Precautions: Fall Restrictions Weight Bearing Restrictions: No General:   Vital Signs:   Pain: Pain Assessment Pain Scale: 0-10 Pain Score: 0-No pain Mobility:   Locomotion :    Trunk/Postural Assessment :    Balance:   Exercises:   Other Treatments:      Therapy/Group: Individual Therapy  HJunie Panning12/24/2021, 9:04 AM

## 2020-05-26 NOTE — Progress Notes (Signed)
[facility] PHYSICAL MEDICINE & REHABILITATION PROGRESS NOTE   Subjective/Complaints:  Pt reports nurse told her she had a seizure this AM- spoke to nurse- she didn't have a seizure per nursing reports.   Wants to sleep.    ROS: limited due to cognition  Objective:   No results found. No results for input(s): WBC, HGB, HCT, PLT in the last 72 hours. No results for input(s): NA, K, CL, CO2, GLUCOSE, BUN, CREATININE, CALCIUM in the last 72 hours.  Intake/Output Summary (Last 24 hours) at 05/26/2020 1026 Last data filed at 05/25/2020 1806 Gross per 24 hour  Intake 380 ml  Output --  Net 380 ml     Pressure Injury 04/22/20 Back Medial;Right Stage 2 -  Partial thickness loss of dermis presenting as a shallow open injury with a red, pink wound bed without slough. Medical device pressure injury-shape of lopez valve (Active)  04/22/20 0800  Location: Back  Location Orientation: Medial;Right  Staging: Stage 2 -  Partial thickness loss of dermis presenting as a shallow open injury with a red, pink wound bed without slough.  Wound Description (Comments): Medical device pressure injury-shape of lopez valve  Present on Admission: No    Physical Exam: Vital Signs Blood pressure 123/70, pulse 69, temperature 98.2 F (36.8 C), temperature source Oral, resp. rate 18, height 5\' 6"  (1.676 m), weight 57 kg, SpO2 100 %.  Gen: sleeping in bed- upset she was woken up, NAD HEENT: oral mucosa pink and moist, NCAT- no cranial nerve abnormalities Cardio: RRR Chest: CTA B/L- no W/R/R- good air movement Abd: Soft, NT, ND, (+)BS  Ext: no edema Skin: intact Psych: flat affect- no change Skin: Clean and intact without signs of breakdown Neuro: fair insight and awareness.. Cranial nerves 2-12 are intact. Sensory exam is normal. Reflexes are 2+ in all 4's. Fine motor coordination is intact. No tremors. Motor function is grossly 4- to 4/5 in uppers and 3+ to 4/5 prox to distal in lowers..   Musculoskeletal: Full ROM, No pain with AROM or PROM in the neck, trunk, or extremities. Posture appropriate    Assessment/Plan: 1. Functional deficits which require 3+ hours per day of interdisciplinary therapy in a comprehensive inpatient rehab setting.  Physiatrist is providing close team supervision and 24 hour management of active medical problems listed below.  Physiatrist and rehab team continue to assess barriers to discharge/monitor patient progress toward functional and medical goals  Care Tool:  Bathing    Body parts bathed by patient: Right arm,Left arm,Chest,Abdomen,Front perineal area,Buttocks,Right upper leg,Left upper leg,Face   Body parts bathed by helper: Right lower leg,Left lower leg     Bathing assist Assist Level: Moderate Assistance - Patient 50 - 74%     Upper Body Dressing/Undressing Upper body dressing   What is the patient wearing?: Button up shirt    Upper body assist Assist Level: Supervision/Verbal cueing    Lower Body Dressing/Undressing Lower body dressing      What is the patient wearing?: Pants,Underwear/pull up     Lower body assist Assist for lower body dressing: Minimal Assistance - Patient > 75%     Toileting Toileting    Toileting assist Assist for toileting: Contact Guard/Touching assist     Transfers Chair/bed transfer  Transfers assist     Chair/bed transfer assist level: Contact Guard/Touching assist     Locomotion Ambulation   Ambulation assist      Assist level: Minimal Assistance - Patient > 75% Assistive device: No Device Max  distance: 120'   Walk 10 feet activity   Assist     Assist level: Minimal Assistance - Patient > 75% Assistive device: No Device   Walk 50 feet activity   Assist    Assist level: Minimal Assistance - Patient > 75% Assistive device: No Device    Walk 150 feet activity   Assist Walk 150 feet activity did not occur: Safety/medical concerns  Assist level: Minimal  Assistance - Patient > 75% Assistive device: No Device    Walk 10 feet on uneven surface  activity   Assist Walk 10 feet on uneven surfaces activity did not occur: Safety/medical concerns   Assist level: Minimal Assistance - Patient > 75% Assistive device: Hand held assist   Wheelchair     Assist Will patient use wheelchair at discharge?: No             Wheelchair 50 feet with 2 turns activity    Assist            Wheelchair 150 feet activity     Assist          Blood pressure 123/70, pulse 69, temperature 98.2 F (36.8 C), temperature source Oral, resp. rate 18, height 5\' 6"  (1.676 m), weight 57 kg, SpO2 100 %.  Medical Problem List and Plan: 1.Functional and mobility deficitssecondary to debility after multiple medical issues, seizure disorder -patient may shower -ELOS/Goals: 10-14 days, supervision with PT, OT, SLP  - team providing positive reinforcement  -Continue CIR 2. Antithrombotics: -DVT/anticoagulation:Pharmaceutical:Lovenox -antiplatelet therapy: N/A 3. Pain Management:Pain is currently well controlled- continue tylenol prn 4. Mood:LCSW to follow for evaluation and support. -antipsychotic agents: N/A 5. Neuropsych: This patientis not fullycapable of making decisions on herown behalf. 6. Skin/Wound Care:Routine pressure relief measures. 7. Fluids/Electrolytes/Nutrition:  12/21 -offering supplemental shakes to boost protein   -encouraging po 8. ESBL UTI with bacteremia: On Invanz-->repeat CT abdomen/pelvis 12/24 to help determine course of treatment  12/24- being done today 9. Question of renal abscess:Leucocytosis resolving on Invanz  12/20 wbc 7k 10. H/o seizures: Breakthrough seizure due to drug interaction--will have pharmacy continue to dose Depakote while on Invanz. Continue Vimpat bid with Depakote 1000/500 mg for now. On ativan taper.   12/24- said she had a  seizure, but no signs/Sx's of seizure on exam and nursing documents no signs of seizure.  11. Anxiety/agitation: Continue home dose Klonopin 0.25 mg bid with Seroquel 25 mg bid for now.    -mood fairly up beat and positive today  12/21-22 anxiety better- continue current regimen. More participatory overall with therapy 12. Ongoing cough: CXR clear. Continue Tessalon perles with dulera bid.   12/23- no cough this AM- con't regimen 13. Chronic constipation: Continue colace bid--monitor as now on antibiotics.  12/20 moving bowels. Encouraging her to eat more consistently 14. Sleepy  12/23- wants naps after AM therapy- will try and plan for this.  12/24- napping this AM     LOS: 8 days A FACE TO FACE EVALUATION WAS PERFORMED  Nakeshia Waldeck 05/26/2020, 10:26 AM

## 2020-05-26 NOTE — Progress Notes (Signed)
Occupational Therapy Session Note  Patient Details  Name: Maria [name] MRN: [id number] Date of Birth: [birth year]  Today's Date: 05/26/2020 OT Individual Time: 1100-1155 OT Individual Time Calculation (min): 55 min    Short Term Goals: Week 2:  OT Short Term Goal 1 (Week 2): LTG=STG 2/2 ELOS  Skilled Therapeutic Interventions/Progress Updates:    Patient greeted semi-reclined in bed with eyes closed. Pt easy to wake and agreeable to OT treatment session with encouragement. Pt able to don socks in bed with min A. She then transferred to EOB with supervision. Pt ambulated 10 feet to wc in room with CGA and verbal cues not to reach for walls and furniture. Pt brought down to dayroom and worked on B UE coordination with Ship broker. Encouraged meaningful participation with singing christmas carols. Functional ambulation on unit back to room with min HHA and encouragement from patients sister. Pt left seated in wc with sister present to open christmas presents.   Therapy Documentation Precautions:  Precautions Precautions: Fall Restrictions Weight Bearing Restrictions: No Pain: Pain Assessment Pain Scale: Faces Faces Pain Scale: No hurt   Therapy/Group: Individual Therapy  Valma Cava 05/26/2020, 11:56 AM

## 2020-05-27 NOTE — Progress Notes (Signed)
[facility] PHYSICAL MEDICINE & REHABILITATION PROGRESS NOTE   Subjective/Complaints:    Patient asking about going home.  Explained discharge date as well as today's date.  We looked at the calendar.  We discussed that her primary infection is the left kidney.   ROS: limited due to cognition  Objective:   CT ABDOMEN PELVIS W CONTRAST  Result Date: 05/26/2020 CLINICAL DATA:  Pyelonephritis recent left renal abscess EXAM: CT ABDOMEN AND PELVIS WITH CONTRAST TECHNIQUE: Multidetector CT imaging of the abdomen and pelvis was performed using the standard protocol following bolus administration of intravenous contrast. CONTRAST:  3mL OMNIPAQUE IOHEXOL 350 MG/ML SOLN COMPARISON:  05/12/2020, 04/14/2020 FINDINGS: Lower chest: Unchanged bandlike scarring right lung base. Hepatobiliary: No solid liver abnormality is seen. No gallstones, gallbladder wall thickening, or biliary dilatation. Pancreas: Unremarkable. No pancreatic ductal dilatation or surrounding inflammatory changes. Spleen: Normal in size without significant abnormality. Adrenals/Urinary Tract: Adrenal glands are unremarkable. Asymmetrically atrophic left kidney, particularly inferior pole. There is improved fat stranding and edema about the inferior pole, likely with some residual scarring (series 3, image 39). There is some persistent hypoenhancement of the superior pole cortex, which is however improved as well (series 6, image 85). The right kidney is normal in appearance and cortical contrast enhancement. Bladder is unremarkable. Stomach/Bowel: Stomach is within normal limits. Appendix appears normal. No evidence of bowel wall thickening, distention, or inflammatory changes. Vascular/Lymphatic: Aortic atherosclerosis. No enlarged abdominal or pelvic lymph nodes. Reproductive: Status post hysterectomy. Other: No abdominal wall hernia or abnormality. No abdominopelvic ascites. Musculoskeletal: No acute or significant osseous findings.  IMPRESSION: Asymmetrically atrophic left kidney, particularly the inferior pole. There is improved fat stranding and edema about the inferior pole, likely with some residual scarring. There is some persistent hypoenhancement of the superior pole cortex, which is however improved as well. Findings are consistent with improved, although likely ongoing chronic/recurrent pyelonephritis. No evidence of abscess at this time. No hydronephrosis. Aortic Atherosclerosis (ICD10-I70.0). Electronically Signed   By: Eddie Candle M.D.   On: 05/26/2020 13:16   No results for input(s): WBC, HGB, HCT, PLT in the last 72 hours. No results for input(s): NA, K, CL, CO2, GLUCOSE, BUN, CREATININE, CALCIUM in the last 72 hours.  Intake/Output Summary (Last 24 hours) at 05/27/2020 1221 Last data filed at 05/26/2020 1816 Gross per 24 hour  Intake 400 ml  Output --  Net 400 ml        Physical Exam: Vital Signs Blood pressure 103/63, pulse 74, temperature 98.6 F (37 C), resp. rate 16, height 5\' 6"  (1.676 m), weight 56.8 kg, SpO2 97 %.   General: No acute distress Mood and affect are appropriate Heart: Regular rate and rhythm no rubs murmurs or extra sounds Lungs: Clear to auscultation, breathing unlabored, no rales or wheezes Abdomen: Positive bowel sounds, soft nontender to palpation, nondistended Extremities: No clubbing, cyanosis, or edema Skin: No evidence of breakdown, no evidence of rash  Psych: flat affect- no change Skin: Clean and intact without signs of breakdown Neuro: fair insight and awareness.. Cranial nerves 2-12 are intact. Sensory exam is normal. Reflexes are 2+ in all 4's. Fine motor coordination is intact. No tremors. Motor function is grossly 4- to 4/5 in uppers and 3+ to 4/5 prox to distal in lowers..  Musculoskeletal: Full ROM, No pain with AROM or PROM in the neck, trunk, or extremities. Posture appropriate    Assessment/Plan: 1. Functional deficits which require 3+ hours per day of  interdisciplinary therapy in a comprehensive inpatient  rehab setting.  Physiatrist is providing close team supervision and 24 hour management of active medical problems listed below.  Physiatrist and rehab team continue to assess barriers to discharge/monitor patient progress toward functional and medical goals  Care Tool:  Bathing    Body parts bathed by patient: Right arm,Left arm,Chest,Abdomen,Front perineal area,Buttocks,Right upper leg,Left upper leg,Face   Body parts bathed by helper: Right lower leg,Left lower leg     Bathing assist Assist Level: Moderate Assistance - Patient 50 - 74%     Upper Body Dressing/Undressing Upper body dressing   What is the patient wearing?: Button up shirt    Upper body assist Assist Level: Supervision/Verbal cueing    Lower Body Dressing/Undressing Lower body dressing      What is the patient wearing?: Pants,Underwear/pull up     Lower body assist Assist for lower body dressing: Minimal Assistance - Patient > 75%     Toileting Toileting    Toileting assist Assist for toileting: Contact Guard/Touching assist     Transfers Chair/bed transfer  Transfers assist     Chair/bed transfer assist level: Contact Guard/Touching assist     Locomotion Ambulation   Ambulation assist      Assist level: Minimal Assistance - Patient > 75% Assistive device: No Device Max distance: 120'   Walk 10 feet activity   Assist     Assist level: Minimal Assistance - Patient > 75% Assistive device: No Device   Walk 50 feet activity   Assist    Assist level: Minimal Assistance - Patient > 75% Assistive device: No Device    Walk 150 feet activity   Assist Walk 150 feet activity did not occur: Safety/medical concerns  Assist level: Minimal Assistance - Patient > 75% Assistive device: No Device    Walk 10 feet on uneven surface  activity   Assist Walk 10 feet on uneven surfaces activity did not occur: Safety/medical  concerns   Assist level: Minimal Assistance - Patient > 75% Assistive device: Hand held assist   Wheelchair     Assist Will patient use wheelchair at discharge?: No             Wheelchair 50 feet with 2 turns activity    Assist            Wheelchair 150 feet activity     Assist          Blood pressure 103/63, pulse 74, temperature 98.6 F (37 C), resp. rate 16, height 5\' 6"  (1.676 m), weight 56.8 kg, SpO2 97 %.  Medical Problem List and Plan: 1.Functional and mobility deficitssecondary to debility after multiple medical issues, seizure disorder -patient may shower -ELOS/Goals: Planned discharge is 06/01/2020, supervision with PT, OT, SLP  - team providing positive reinforcement  -Continue CIR 2. Antithrombotics: -DVT/anticoagulation:Pharmaceutical:Lovenox -antiplatelet therapy: N/A 3. Pain Management:Pain is currently well controlled- continue tylenol prn 4. Mood:LCSW to follow for evaluation and support. -antipsychotic agents: N/A 5. Neuropsych: This patientis not fullycapable of making decisions on herown behalf. 6. Skin/Wound Care:Routine pressure relief measures. 7. Fluids/Electrolytes/Nutrition:  12/21 -offering supplemental shakes to boost protein   -encouraging po 8. ESBL UTI with bacteremia: On Invanz-->repeat CT abdomen/pelvis 12/24 to help determine course of treatment  Will ask ID to review CT pelvis results. 9. Question of renal abscess:Leucocytosis resolving on Invanz  12/20 wbc 7k 10. H/o seizures: Breakthrough seizure due to drug interaction--will have pharmacy continue to dose Depakote while on Invanz. Continue Vimpat bid with Depakote 1000/500 mg for now.  On ativan taper.   12/24- said she had a seizure, but no signs/Sx's of seizure on exam and nursing documents no signs of seizure.  11. Anxiety/agitation: Continue home dose Klonopin 0.25 mg bid with Seroquel 25 mg bid  for now.    -mood fairly up beat and positive today  12/21-22 anxiety better- continue current regimen. More participatory overall with therapy 12. Ongoing cough: CXR clear. Continue Tessalon perles with dulera bid.   No cough, no shortness of breath 13. Chronic constipation: Continue colace bid--monitor as now on antibiotics.  12/20 moving bowels. Encouraging her to eat more consistently 14. Sleepy  12/23- wants naps after AM therapy- will try and plan for this.  12/24- napping this AM     LOS: 9 days A FACE TO FACE EVALUATION WAS PERFORMED  Erick Colace 05/27/2020, 12:21 PM

## 2020-05-28 ENCOUNTER — Inpatient Hospital Stay (HOSPITAL_COMMUNITY): Payer: Medicare Other | Admitting: Occupational Therapy

## 2020-05-28 ENCOUNTER — Inpatient Hospital Stay (HOSPITAL_COMMUNITY): Payer: Medicare Other | Admitting: Physical Therapy

## 2020-05-28 ENCOUNTER — Inpatient Hospital Stay (HOSPITAL_COMMUNITY): Payer: Medicare Other | Admitting: Speech Pathology

## 2020-05-28 ENCOUNTER — Inpatient Hospital Stay (HOSPITAL_COMMUNITY): Payer: Medicare Other

## 2020-05-28 NOTE — Progress Notes (Signed)
Speech Language Pathology Daily Session Note  Patient Details  Name: Maria [name] MRN: [id number] Date of Birth: 1961-02-17  Today's Date: 05/28/2020 SLP Individual Time: 1000-1010 SLP Individual Time Calculation (min): 10 min and Today's Date: 05/28/2020 SLP Missed Time: 20 Minutes Missed Time Reason: Patient fatigue  Short Term Goals: Week 1: SLP Short Term Goal 1 (Week 1): Pt will consume trials of regular textures and demonstrate efficient masticsation and complete oral clarance without overt s/s of aspiration with Mod I over 2 sessions prior to upgrade. SLP Short Term Goal 1 - Progress (Week 1): Met SLP Short Term Goal 2 (Week 1): Pt will demonstrate adequate safety awareness for hospital situation with 85% accuracy min a verbal cues. SLP Short Term Goal 3 (Week 1): Pt will demonstrate orientation x4 using external aids with min A verbal cues. SLP Short Term Goal 4 (Week 1): Pt will sustain attention to functional tasks for 5-6 minutes with min A verbal cues.  Skilled Therapeutic Interventions: Skilled treatment session focused on cognitive goals. Upon arrival, patient reported she did not feel like doing anything or getting out of bed. However, patient was agreeable to using the commode. SLP facilitated session by providing total A for patient to donn socks due to decreased frustration tolerance. Patient transferred to the commode with supervision and was continent of urine. Patient transferred back to bed at end of session and declined further participation due to "not feeling well." Patient left upright in bed with alarm on and all needs within reach. Continue with current plan of care.      Pain Pain Assessment Pain Scale: 0-10 Pain Score: 0-No pain  Therapy/Group: Individual Therapy  Lewanda Perea, [place] 05/28/2020, 12:21 PM

## 2020-05-28 NOTE — Progress Notes (Signed)
Occupational Therapy Session Note  Patient Details  Name: Maria [name] MRN: [id number] Date of Birth: Oct 19, 1960  Today's Date: 05/28/2020 OT Individual Time: 0800-0910 OT Individual Time Calculation (min): 70 min    Short Term Goals: Week 2:  OT Short Term Goal 1 (Week 2): LTG=STG 2/2 ELOS  Skilled Therapeutic Interventions/Progress Updates:    Pt asleep upon arrival but easily aroused. Pt requested to use toilet. Pt donned socks without assistance and amb to bathroom with HHA. Toiletng tasks with CGA. Pt returned to room and stood at sink to wash hands and wash face. Pt declined bathing and changing clothing. Pt stated she wanted to return to bed but finally agreed to staying in room and completing simple tasks. Pt amb in room to retrieve towels from floor and place in dirty linen bag-CGA when retrieving. Pt prefers to use furniture and wall for support when walking in room and requires max encouragement/verbal cues to avoid using furniture. Pt performed sit<>stand from EOB 3x10 without use of BUE for support. Pt returned to bed with supervison. Pt remained in bed with all needs within reach and bed alar activated.   Therapy Documentation Precautions:  Precautions Precautions: Fall Restrictions Weight Bearing Restrictions: No  Pain: Pt denies pain this morning  Therapy/Group: Individual Therapy  Leroy Libman 05/28/2020, 9:11 AM

## 2020-05-28 NOTE — Progress Notes (Signed)
Physical Therapy Session Note  Patient Details  Name: Maria [name] MRN: [id number] Date of Birth: Jun 15, 1960  Today's Date: 05/28/2020 PT Individual Time: 4862-8241 PT Individual Time Calculation (min): 30 min   Short Term Goals: Week 2:  PT Short Term Goal 1 (Week 2): STGs= LTGs  Skilled Therapeutic Interventions/Progress Updates:   Pt received supine in bed for afternoon PT session. Pt initially pleasant to this PT, but as soon as therapists requested pt to engage in any physical activity, pt becoming increasingly frustrated, stating that she was not getting out of bed today. Because "they" had woken her up too early to day and she "does NOT like being woken up early". Pt repeated stating that the only thing she wanted was ice chips. Pt obtained ice chips and returned to the room. Pt then agreeable to get out of bed to use the restroom only. Ambulatory transfer to toilet with supervision assist and noted use of UE to "furniture walk to bathroom. No LOB noted. Pt performed all clothing management and pericare with supervision assist for safety. Pt again became slightly agitated when encouraged to perform additional mobility training tasks while out of bed. hand hygiene at sink with supervision assist. Pt ambulated back to bed with supervision assist and left sitting up in bed with call bell in reach and all needs met.      Therapy Documentation Precautions:  Precautions Precautions: Fall Restrictions Weight Bearing Restrictions: No Vital Signs: Therapy Vitals Temp: (!) 97.5 F (36.4 C) Temp Source: Oral Pulse Rate: 86 Resp: 18 BP: 119/72 Patient Position (if appropriate): Sitting Oxygen Therapy SpO2: 100 % O2 Device: Room Air Pain:    Denies   Therapy/Group: Individual Therapy  Lorie Phenix 05/28/2020, 2:45 PM

## 2020-05-29 ENCOUNTER — Ambulatory Visit (HOSPITAL_COMMUNITY): Payer: Medicare Other

## 2020-05-29 ENCOUNTER — Encounter (HOSPITAL_COMMUNITY): Payer: Medicare Other | Admitting: Occupational Therapy

## 2020-05-29 ENCOUNTER — Inpatient Hospital Stay (HOSPITAL_COMMUNITY): Payer: Medicare Other | Admitting: Occupational Therapy

## 2020-05-29 ENCOUNTER — Inpatient Hospital Stay (HOSPITAL_COMMUNITY): Payer: Medicare Other | Admitting: Speech Pathology

## 2020-05-29 LAB — BASIC METABOLIC PANEL
Anion gap: 13 (ref 5–15)
BUN: 9 mg/dL (ref 6–20)
CO2: 23 mmol/L (ref 22–32)
Calcium: 9.3 mg/dL (ref 8.9–10.3)
Chloride: 105 mmol/L (ref 98–111)
Creatinine, Ser: 0.87 mg/dL (ref 0.44–1.00)
GFR, Estimated: >60 mL/min (ref >60)
Glucose, Bld: 97 mg/dL (ref 70–99)
Potassium: 4.1 mmol/L (ref 3.5–5.1)
Sodium: 141 mmol/L (ref 135–145)

## 2020-05-29 LAB — CBC
HCT: 30.4 % — ABNORMAL LOW (ref 36.0–46.0)
Hemoglobin: 9.8 g/dL — ABNORMAL LOW (ref 12.0–15.0)
MCH: 30.1 pg (ref 26.0–34.0)
MCHC: 32.2 g/dL (ref 30.0–36.0)
MCV: 93.3 fL (ref 80.0–100.0)
Platelets: 239 10*3/uL (ref 150–400)
RBC: 3.26 MIL/uL — ABNORMAL LOW (ref 3.87–5.11)
RDW: 16.6 % — ABNORMAL HIGH (ref 11.5–15.5)
WBC: 6.5 10*3/uL (ref 4.0–10.5)
nRBC: 0 % (ref 0.0–0.2)

## 2020-05-29 LAB — VALPROIC ACID LEVEL: Valproic Acid Lvl: 49 ug/mL — ABNORMAL LOW (ref 50.0–100.0)

## 2020-05-29 MED ORDER — DIVALPROEX SODIUM 500 MG PO DR TAB
1250.0000 mg | DELAYED_RELEASE_TABLET | Freq: Every day | ORAL | Status: DC
  Administered 2020-05-29 – 2020-05-31 (×3): 1250 mg via ORAL
  Filled 2020-05-29 (×4): qty 1

## 2020-05-29 NOTE — Progress Notes (Signed)
Occupational Therapy Session Note  Patient Details  Name: NATAUSHA JUNGWIRTH MRN: [id number] Date of Birth: 31-Jul-1960  Today's Date: 05/29/2020 OT Individual Time: 1400-1440 OT Individual Time Calculation (min): 40 min   Short Term Goals: Week 2:  OT Short Term Goal 1 (Week 2): LTG=STG 2/2 ELOS  Skilled Therapeutic Interventions/Progress Updates:    Pt greeted semi-reclined in bed with family present for family education. Pt completed bed mobility with supervision. She then ambulated to therapy apartment with 1 seated rest break. Practiced stepping in and out of tub using grab bars in simulated home environment. Practiced getting up off of low couch with education provided for home modifications for safe BADL participation. Answered families questions regarding patient progress and dc plans. Pt ambulated back to room and was left seated in wc with alarm belt on, family present and needs met.   Therapy Documentation Precautions:  Precautions Precautions: Fall Restrictions Weight Bearing Restrictions: No Pain: Pain Assessment Pain Score: 0-No pain  Therapy/Group: Individual Therapy  Valma Cava 05/29/2020, 2:43 PM

## 2020-05-29 NOTE — Progress Notes (Signed)
Speech Language Pathology Daily Session Notes  Patient Details  Name: Maria [name] MRN: [id number] Date of Birth: Nov 05, 1960  Today's Date: 05/29/2020  Session 1: SLP Individual Time: 1100-1120 SLP Individual Time Calculation (min): 20 min   Session 2: SLP Individual Time: 2458-0998 SLP Individual Time Calculation (min): 15 min  Short Term Goals: Week 1: SLP Short Term Goal 1 (Week 1): Pt will consume trials of regular textures and demonstrate efficient masticsation and complete oral clarance without overt s/s of aspiration with Mod I over 2 sessions prior to upgrade. SLP Short Term Goal 1 - Progress (Week 1): Met SLP Short Term Goal 2 (Week 1): Pt will demonstrate adequate safety awareness for hospital situation with 85% accuracy min a verbal cues. SLP Short Term Goal 3 (Week 1): Pt will demonstrate orientation x4 using external aids with min A verbal cues. SLP Short Term Goal 4 (Week 1): Pt will sustain attention to functional tasks for 5-6 minutes with min A verbal cues.  Skilled Therapeutic Interventions:  Session 1: Skilled treatment session focused on cognitive goals. Upon arrival, patient was upright in the wheelchair. Patient appeared lethargic and required supervision verbal cues for orientation to date. Patient declined to participate in any further activities and declined her early tray and requested to get back into bed. Patient required overall supervision level verbal cues for safety with task. Patient left upright in bed with alarm on and all needs within reach. Continue with current plan of care.   Session 2: Skilled treatment session focused on dysphagia goals. SLP facilitated session by providing skilled observation with lunch meal of regular textures with thin liquids. Patient consumed meal without overt s/s of aspiration and was overall mod I for use of swallowing compensatory strategies. Recommend patient continue current diet. Patient's sister arrived at end of  session and reported that the patient is at her cognitive baseline, therefore, patient will be discharged from skilled SLP intervention due to meeting all cognitive and swallowing goals.      Pain Pain Assessment Pain Score: 0-No pain  Therapy/Group: Individual Therapy  Eyden Dobie 05/29/2020, 2:20 PM

## 2020-05-29 NOTE — Progress Notes (Signed)
[facility] PHYSICAL MEDICINE & REHABILITATION PROGRESS NOTE   Subjective/Complaints: No complaints, somnolent Appreciate pharm management of anticonvulsants. Note reviewed. VPA level 49 slightly below goal. VPA dose has been increased. Hgb 9.8. other labs stable today  ROS: limited due to cognition  Objective:   No results found. Recent Labs    05/29/20 0557  WBC 6.5  HGB 9.8*  HCT 30.4*  PLT 239   Recent Labs    05/29/20 0557  NA 141  K 4.1  CL 105  CO2 23  GLUCOSE 97  BUN 9  CREATININE 0.87  CALCIUM 9.3    Intake/Output Summary (Last 24 hours) at 05/29/2020 1153 Last data filed at 05/29/2020 0800 Gross per 24 hour  Intake 120 ml  Output --  Net 120 ml        Physical Exam: Vital Signs Blood pressure 113/60, pulse 70, temperature 98 F (36.7 C), resp. rate 16, height 5\' 6"  (1.676 m), weight 56 kg, SpO2 97 %.  Gen: no distress, normal appearing HEENT: oral mucosa pink and moist, NCAT Cardio: Reg rate Chest: normal effort, normal rate of breathing Abd: soft, non-distended Ext: no edema Skin: intact Psych: flat affect- no change Skin: Clean and intact without signs of breakdown Neuro: fair insight and awareness.. Cranial nerves 2-12 are intact. Sensory exam is normal. Reflexes are 2+ in all 4's. Fine motor coordination is intact. No tremors. Motor function is grossly 4- to 4/5 in uppers and 3+ to 4/5 prox to distal in lowers..  Musculoskeletal: Full ROM, No pain with AROM or PROM in the neck, trunk, or extremities. Posture appropriate    Assessment/Plan: 1. Functional deficits which require 3+ hours per day of interdisciplinary therapy in a comprehensive inpatient rehab setting.  Physiatrist is providing close team supervision and 24 hour management of active medical problems listed below.  Physiatrist and rehab team continue to assess barriers to discharge/monitor patient progress toward functional and medical goals  Care Tool:  Bathing    Body  parts bathed by patient: Right arm,Left arm,Chest,Abdomen,Front perineal area,Buttocks,Right upper leg,Left upper leg,Face,Right lower leg,Left lower leg   Body parts bathed by helper: Right lower leg,Left lower leg     Bathing assist Assist Level: Supervision/Verbal cueing     Upper Body Dressing/Undressing Upper body dressing   What is the patient wearing?: Pull over shirt    Upper body assist Assist Level: Supervision/Verbal cueing    Lower Body Dressing/Undressing Lower body dressing      What is the patient wearing?: Pants,Underwear/pull up     Lower body assist Assist for lower body dressing: Supervision/Verbal cueing     Toileting Toileting    Toileting assist Assist for toileting: Supervision/Verbal cueing     Transfers Chair/bed transfer  Transfers assist     Chair/bed transfer assist level: Supervision/Verbal cueing     Locomotion Ambulation   Ambulation assist      Assist level: Minimal Assistance - Patient > 75% Assistive device: No Device Max distance: 120'   Walk 10 feet activity   Assist     Assist level: Minimal Assistance - Patient > 75% Assistive device: No Device   Walk 50 feet activity   Assist    Assist level: Minimal Assistance - Patient > 75% Assistive device: No Device    Walk 150 feet activity   Assist Walk 150 feet activity did not occur: Safety/medical concerns  Assist level: Minimal Assistance - Patient > 75% Assistive device: No Device    Walk 10  feet on uneven surface  activity   Assist Walk 10 feet on uneven surfaces activity did not occur: Safety/medical concerns   Assist level: Minimal Assistance - Patient > 75% Assistive device: Hand held assist   Wheelchair     Assist Will patient use wheelchair at discharge?: No             Wheelchair 50 feet with 2 turns activity    Assist            Wheelchair 150 feet activity     Assist          Blood pressure 113/60,  pulse 70, temperature 98 F (36.7 C), resp. rate 16, height 5\' 6"  (1.676 m), weight 56 kg, SpO2 97 %.  Medical Problem List and Plan: 1.Functional and mobility deficitssecondary to debility after multiple medical issues, seizure disorder -patient may shower -ELOS/Goals: Planned discharge is 06/01/2020, supervision with PT, OT, SLP  - team providing positive reinforcement  -Continue CIR 2. Antithrombotics: -DVT/anticoagulation:Pharmaceutical:Lovenox -antiplatelet therapy: N/A 3. Pain Management:Pain is currently well controlled- continue tylenol prn 4. Mood:LCSW to follow for evaluation and support. -antipsychotic agents: N/A 5. Neuropsych: This patientis not fullycapable of making decisions on herown behalf. 6. Skin/Wound Care:Routine pressure relief measures. 7. Fluids/Electrolytes/Nutrition:  12/21 -offering supplemental shakes to boost protein   -encouraging po 8. ESBL UTI with bacteremia: On Invanz-->repeat CT abdomen/pelvis 12/24 to help determine course of treatment  Will ask ID to review CT pelvis results. 9. Question of renal abscess:Leucocytosis resolving on Invanz  12/20 wbc 7k, 6.5 on 12/17- monitor weekly  10. H/o seizures: Breakthrough seizure due to drug interaction--will have pharmacy continue to dose Depakote while on Invanz. Continue Vimpat bid with Depakote 1000/500 mg for now. On ativan taper.   12/27: appreciate pharm recommendations for increased VPA dose given low level. 11. Anxiety/agitation: Continue home dose Klonopin 0.25 mg bid with Seroquel 25 mg bid for now.    -mood fairly up beat and positive today  12/21-22 anxiety better- continue current regimen. More participatory overall with therapy 12. Ongoing cough: CXR clear. Continue Tessalon perles with dulera bid.   No cough, no shortness of breath 13. Chronic constipation: Continue colace bid--monitor as now on antibiotics.  12/20 moving  bowels. Encouraging her to eat more consistently 14. Sleepy  12/23- wants naps after AM therapy- will try and plan for this.  12/24- napping this AM  15. Anemia: Hgb 9.8 12/27- monitor weekly.     LOS: 11 days A FACE TO FACE EVALUATION WAS PERFORMED  Clide Deutscher Dantonio Justen 05/29/2020, 11:53 AM

## 2020-05-29 NOTE — Progress Notes (Signed)
Patient ID: Maria [name], female   DOB: 05-08-1961, 59 y.o.   MRN: [id number]  SW ordered DME: TTB and 3in1 BSC with [employer] via parachute.   SW called pt sister Maria [name] to provide updates on above. States she spoke with [facility] and does not need TTB. SW informed on [facility] list being left in room for preference, and will follow-up about preference. Pt will need HHPT/OT.   Loralee Pacas, MSW, [place] Office: [phone] Cell: [phone] Fax: [phone]

## 2020-05-29 NOTE — Progress Notes (Signed)
Chart Check note  Called by inpatient rehab team on duration of abx for esbl ecoli pyelo Reviewed ct 12/24 --> improved, only stranding left where previously abscess present crp normalized Patient been on ertapenem 12/10-c  A/p Pyelo/Renal abscess ecoli bacteremia  -would aim for total abx at least 4 weeks; end date 06/10/2020 -continue weekly cbc, cmp, and crp -he has an appointment with Dr Earlene Plater at [facility] on 1/7 @ 230 pm

## 2020-05-29 NOTE — Progress Notes (Signed)
Occupational Therapy Session Note  Patient Details  Name: Maria [name] MRN: [id number] Date of Birth: 08/31/1960  Today's Date: 05/29/2020 OT Individual Time: 0930-1000 OT Individual Time Calculation (min): 30 min    Short Term Goals: Week 2:  OT Short Term Goal 1 (Week 2): LTG=STG 2/2 ELOS  Skilled Therapeutic Interventions/Progress Updates:    Pt received in bed sleeping but awoke easily and was pleasant and cooperative this session and agreeable to bathing and dressing. Pt did tell me she needed to toilet right away, ambulated to toilet with RW with close S.  Her brief was already wet but she was able to continue to urinate sitting on toilet. From toilet, she completed LB bathing and dressing with cues for thoroughness and S. Returned to EOB to b/d UB with supervision and min cues. Pt agreeable to sitting in wc.  Resting in chair with belt alarm on and all needs met.   Therapy Documentation Precautions:  Precautions Precautions: Fall Restrictions Weight Bearing Restrictions: No Vital Signs: Therapy Vitals Temp: 98 F (36.7 C) Pulse Rate: 70 Resp: 16 BP: 113/60 Patient Position (if appropriate): Lying Oxygen Therapy SpO2: 97 % Pain: Pain Assessment Pain Scale: 0-10 Pain Score: 0-No pain ADL: ADL Eating: Set up Grooming: Minimal assistance Upper Body Bathing: Minimal assistance Lower Body Bathing: Moderate assistance Upper Body Dressing: Minimal assistance Lower Body Dressing: Moderate assistance Toileting: Moderate assistance Toilet Transfer: Moderate assistance   Therapy/Group: Individual Therapy  Leonel Mccollum 05/29/2020, 8:27 AM

## 2020-05-29 NOTE — Progress Notes (Signed)
Physical Therapy Session Note  Patient Details  Name: Maria [name] MRN: [id number] Date of Birth: 1960/12/09  Today's Date: 05/29/2020 PT Individual Time: 6440-3474 PT Individual Time Calculation (min): 35 min   Short Term Goals: Week 2:  PT Short Term Goal 1 (Week 2): STGs= LTGs  Skilled Therapeutic Interventions/Progress Updates:  Community reintegration;Ambulation/gait training;DME/adaptive equipment instruction;Neuromuscular re-education;Psychosocial support;Stair training;UE/LE Strength taining/ROM;Balance/vestibular training;Discharge planning;Functional electrical stimulation;Pain management;Skin care/wound management;Therapeutic Activities;UE/LE Coordination activities;Cognitive remediation/compensation;Disease management/prevention;Functional mobility training;Patient/family education;Splinting/orthotics;Therapeutic Exercise;Visual/perceptual remediation/compensation   Pt received seated in WC and agrees to therapy. No complaint of pain. Family present for family education and sister, Jacki Cones, present for entire session while other family members stay in room. Pt performs multiple reps of sit to stand with CGA and tactile cues for body mechanics and hand placement. Pt ambulates >300' with CGA. PT initially provides assist and cues for upright gaze to improve posture and balance, and incresaing gait speed and stride length to decrease risk for falls. Pt's sister provides CGA for middle portion of ambulation and also provides verbal cues to correct gait deviations. Pt performs car transfer with CGA and cues on sequencing. Pt perform ramp navigation and ambulates up/down 4 steps with bilateral hand rails and reciprocal technique. Pt performs toilet transfer with supervision and returns to bed with cues on positioning and sequencing. Left supine with alarm intact and all needs within reach.  Therapy Documentation Precautions:  Precautions Precautions: Fall Restrictions Weight Bearing  Restrictions: No    Therapy/Group: Individual Therapy  Beau Fanny, PT, DPT 05/29/2020, 3:27 PM

## 2020-05-29 NOTE — Progress Notes (Signed)
Pharmacy Consult  Pharmacy consulted for divalproex (Depakote) management. Patient on PTA at dose of 500 mg BID- which was continued as inpatient. Has history of seizure disorder - on divalproex, vimpat, and clonazepam/valium PTA. Per outpatient note on 11/25/2019 the last seizure was about 4 years ago and she she has had been on divalproex, vimpat, and clonazepan since at least 11/2015.    She is on ertapenem for ESBL E Coli  Bactteria. Of note, significant interaction between ertapenem and divalproex - concurrent use may result in decrease valproic acid concentrations. She was noted with a focal seizure on the evening on 12/14.  Valproic acid levels: 12/6: 51 12/12: < 10   12/16: < 10 12/19: 23 12/23: 59 12/27: 49 (slightly below goal0  Patient has been on 1000 mg BID since 12/19.  Plan Increase to Depakote to 1000 mg Q am, 1250 mg Q pm F/u valproic acid level in 4 days to confirm Monitor ertapenem LOT and ID recs  Thank you Okey Regal, PharmD Please check AMION for all [facility] Pharmacy numbers 05/29/2020 8:40 AM

## 2020-05-29 NOTE — Progress Notes (Signed)
Speech Language Pathology Discharge Summary  Patient Details  Name: Maria [name] MRN: [id number] Date of Birth: 03/15/1961   Patient has met 3 of 3 long term goals.  Patient to discharge at overall Modified Independent;Supervision level.   Reasons goals not met: N/A   Clinical Impression/Discharge Summary: Patient has made excellent gains and has met 3 of 3 LTGs this admission. Currently, patient is consuming regular textures with thin liquids and is overall Mod I for use of swallowing compensatory strategies. Patient also requires overall supervision level verbal cues for attention and overall safety during functional and familiar tasks. Patient and family education is complete and patient's family reports she is at her cognitive baseline, therefore, patient will be discharged from skilled SLP intervention with f/u not warranted at this time.   Care Partner:  Caregiver Able to Provide Assistance: Yes  Type of Caregiver Assistance: Physical;Cognitive  Recommendation:  24 hour supervision/assistance;None      Equipment: N/A   Reasons for discharge: Treatment goals met   Patient/Family Agrees with Progress Made and Goals Achieved: Yes    Fredrich Cory 05/29/2020, 2:26 PM

## 2020-05-29 NOTE — Discharge Summary (Signed)
Physician Discharge Summary  Patient ID: Maria [name] MRN: [id number] DOB/AGE: 1960/09/21 59 y.o.  Admit date: 05/18/2020 Discharge date: 06/01/2020  Discharge Diagnoses:  Principal Problem:   Debility Active Problems:   Seizure disorder ([place])   AKI (acute kidney injury) ([place])   Sepsis secondary to UTI ([place])   ESBL (extended spectrum beta-lactamase) producing bacteria infection   Bacteremia due to Escherichia coli   Discharged Condition: stable   Significant Diagnostic Studies: DG Chest 2 View  Result Date: 05/08/2020 CLINICAL DATA:  Reason for exam: cough, persistent Patient showed difficulty verifying name and dob. Denies any sob or chest pains or cough. Patient had dry cough during exam. EXAM: CHEST - 2 VIEW COMPARISON:  05/01/2020 FINDINGS: Lungs are clear.  Interval removal of feeding tube and PICC line. Heart size and mediastinal contours are within normal limits. No effusion.  No pneumothorax. Visualized bones unremarkable. IMPRESSION: No acute cardiopulmonary disease. Electronically Signed   By: Lucrezia Europe M.D.   On: 05/08/2020 10:49   DG Abd 1 View  Result Date: 05/13/2020 CLINICAL DATA:  Abdominal distension. EXAM: ABDOMEN - 1 VIEW COMPARISON:  May 10, 2020. FINDINGS: The bowel gas pattern is normal. No radio-opaque calculi or other significant radiographic abnormality are seen. IMPRESSION: Negative. Electronically Signed   By: Marijo Conception M.D.   On: 05/13/2020 15:55   DG Abd 1 View  Result Date: 05/10/2020 CLINICAL DATA:  Bilateral lower quadrant abdominal pain EXAM: ABDOMEN - 1 VIEW COMPARISON:  05/01/2020 FINDINGS: Lung bases are clear. Esophageal tube has been removed. Nonobstructed gas pattern. Probable phleboliths in the right pelvis. No radiopaque calculi over the kidneys. IMPRESSION: Nonobstructed gas pattern. Electronically Signed   By: Donavan Foil M.D.   On: 05/10/2020 22:14   CT ABDOMEN PELVIS W CONTRAST  Result Date: 05/26/2020 CLINICAL DATA:   Pyelonephritis recent left renal abscess EXAM: CT ABDOMEN AND PELVIS WITH CONTRAST TECHNIQUE: Multidetector CT imaging of the abdomen and pelvis was performed using the standard protocol following bolus administration of intravenous contrast. CONTRAST:  67m OMNIPAQUE IOHEXOL 350 MG/ML SOLN COMPARISON:  05/12/2020, 04/14/2020 FINDINGS: Lower chest: Unchanged bandlike scarring right lung base. Hepatobiliary: No solid liver abnormality is seen. No gallstones, gallbladder wall thickening, or biliary dilatation. Pancreas: Unremarkable. No pancreatic ductal dilatation or surrounding inflammatory changes. Spleen: Normal in size without significant abnormality. Adrenals/Urinary Tract: Adrenal glands are unremarkable. Asymmetrically atrophic left kidney, particularly inferior pole. There is improved fat stranding and edema about the inferior pole, likely with some residual scarring (series 3, image 39). There is some persistent hypoenhancement of the superior pole cortex, which is however improved as well (series 6, image 85). The right kidney is normal in appearance and cortical contrast enhancement. Bladder is unremarkable. Stomach/Bowel: Stomach is within normal limits. Appendix appears normal. No evidence of bowel wall thickening, distention, or inflammatory changes. Vascular/Lymphatic: Aortic atherosclerosis. No enlarged abdominal or pelvic lymph nodes. Reproductive: Status post hysterectomy. Other: No abdominal wall hernia or abnormality. No abdominopelvic ascites. Musculoskeletal: No acute or significant osseous findings. IMPRESSION: Asymmetrically atrophic left kidney, particularly the inferior pole. There is improved fat stranding and edema about the inferior pole, likely with some residual scarring. There is some persistent hypoenhancement of the superior pole cortex, which is however improved as well. Findings are consistent with improved, although likely ongoing chronic/recurrent pyelonephritis. No evidence of  abscess at this time. No hydronephrosis. Aortic Atherosclerosis (ICD10-I70.0). Electronically Signed   By: AEddie CandleM.D.   On: 05/26/2020 13:16   CT ABDOMEN  PELVIS W CONTRAST  Result Date: 05/12/2020 CLINICAL DATA:  Follow-up possible renal abscess EXAM: CT ABDOMEN AND PELVIS WITH CONTRAST TECHNIQUE: Multidetector CT imaging of the abdomen and pelvis was performed using the standard protocol following bolus administration of intravenous contrast. CONTRAST:  50m OMNIPAQUE IOHEXOL 300 MG/ML  SOLN COMPARISON:  04/14/2020 FINDINGS: Lower chest: Bibasilar dependent atelectasis. Heart is borderline in size. Hepatobiliary: No focal hepatic abnormality. Gallbladder unremarkable. Pancreas: No focal abnormality or ductal dilatation. Spleen: No focal abnormality.  Normal size. Adrenals/Urinary Tract: Areas of decreased renal perfusion within the upper and mid poles of the left kidney, new since prior study concerning for pyelonephritis. New focal small low-density lesion in the midpole of the left kidney measures 12 mm and could reflect a small developing abscess. The previously seen fluid collection in the lower pole of the left kidney has resolved. There is perinephric stranding most notable around the lower pole. No hydronephrosis. No focal abnormality on the right. Adrenal glands and urinary bladder unremarkable. Stomach/Bowel: Stomach, large and small bowel grossly unremarkable. Normal appendix Vascular/Lymphatic: Aortoiliac atherosclerosis. No evidence of aneurysm or adenopathy. Reproductive: Prior hysterectomy.  No adnexal masses. Other: No free fluid or free air. Musculoskeletal: No acute bony abnormality. IMPRESSION: Previously seen fluid collection in the lower pole of the left kidney has resolved. There continues to be stranding around the lower pole of the left kidney. In addition, there are new areas of decreased perfusion and striated nephrograms in the mid and upper pole of the left kidney concerning  for pyelonephritis. New small low-density fluid collection in the midpole measures 12 mm and is concerning for new small developing abscess. Aortoiliac atherosclerosis. Dependent bibasilar atelectasis. Electronically Signed   By: KRolm BaptiseM.D.   On: 05/12/2020 22:30   [place]RENAL  Result Date: 05/11/2020 CLINICAL DATA:  Acute kidney injury EXAM: RENAL / URINARY TRACT ULTRASOUND COMPLETE COMPARISON:  Ultrasound renal dated April 25, 2020 FINDINGS: Right Kidney: Renal measurements: 11 x 5.1 x 5.2 cm = volume: 153 mL. There is no hydronephrosis. The cortical echogenicity is slightly increased. Left Kidney: Renal measurements: 9.7 x 5.2 x 3.8 cm = volume: 101 mL. The cortical echogenicity is slightly increased. There is mild left-sided collecting system dilatation. The previously demonstrated hypoechoic lesion involving the left kidney is no longer well visualized. Bladder: Appears normal for degree of bladder distention. Other: None. IMPRESSION: 1. Echogenic kidneys bilaterally which can be seen in patients with medical renal disease. 2. Atrophic left kidney, similar to prior study. 3. Mild left-sided collecting system dilatation. 4. Previously demonstrated hypoechoic area arising from the lower pole the left kidney is not well appreciated on today's study. Electronically Signed   By: CConstance HolsterM.D.   On: 05/11/2020 22:27   DG CHEST PORT 1 VIEW  Result Date: 05/12/2020 CLINICAL DATA:  Shortness of breath, cough. EXAM: PORTABLE CHEST 1 VIEW COMPARISON:  December 9, 21. FINDINGS: The heart size and mediastinal contours are within normal limits. Both lungs are clear. No visible pleural effusions or pneumothorax. Biapical pleuroparenchymal scarring. No acute osseous abnormality. IMPRESSION: No evidence of acute cardiopulmonary disease. Electronically Signed   By: FMargaretha SheffieldMD   On: 05/12/2020 09:53   DG Chest Port 1 View  Result Date: 05/11/2020 CLINICAL DATA:  Acute kidney injury EXAM:  PORTABLE CHEST 1 VIEW COMPARISON:  May 08, 2020 FINDINGS: The heart size is stable. The lung volumes are low. There are probable small bilateral pleural effusions with adjacent atelectasis, left worse than right. There  is no pneumothorax, however evaluation of the right lung apex is limited secondary to overlapping osseous structures. There is no acute osseous abnormality. IMPRESSION: Low lung volumes with probable small bilateral pleural effusions, left greater than right. No definite focal infiltrate. Electronically Signed   By: Constance Holster M.D.   On: 05/11/2020 20:02    Labs:  Basic Metabolic Panel: BMP Latest Ref Rng & Units 05/29/2020 05/22/2020 05/19/2020  Glucose 70 - 99 mg/dL 97 92 97  BUN 6 - 20 mg/dL _0 Creatinine 0.44 - 1.00 mg/dL 0.87 0.81 0.92  BUN/Creat Ratio 9 - 23 - - -  Sodium 135 - 145 mmol/L 141 140 138  Potassium 3.5 - 5.1 mmol/L 4.1 4.0 4.0  Chloride 98 - 111 mmol/L 105 103 102  CO2 22 - 32 mmol/L _1 Calcium 8.9 - 10.3 mg/dL 9.3 9.5 9.4    CBC: CBC Latest Ref Rng & Units 05/29/2020 05/22/2020 05/19/2020  WBC 4.0 - 10.5 K/uL 6.5 7.5 7.0  Hemoglobin 12.0 - 15.0 g/dL 9.8(L) 9.7(L) 9.3(L)  Hematocrit 36.0 - 46.0 % 30.4(L) 31.7(L) 30.6(L)  Platelets 150 - 400 K/uL 239 322 318    CBG: No results for input(s): GLUCAP in the last 168 hours.  Brief HPI:   Maria [name] is a 59 y.o. female with history of intellectual delay, seizure disorder, chronic diastolic CHF, who was originally admitted on 04/14/20 with sepsis due to E coli UTI with pyelonephritis. Hospital course significant for breakthrough seizures with delirium and debility. She was admitted to [facility] on 05/05/20 for intensive rehab program due to decline in functional status.  She was progressing with rehab until 12/08 when she developed fevers with hypotension due to sepsis from ESBL E coli bacteremia. CT abdomen/pelvis revealed left renal abscess not amenable to aspiration and she was  transitioned to [facility] with recommendations to repeat CT on 12/24 to help determine LOT. Hospital course significant for break thorough seizures due to sepsis and drug to drug interaction between ertapenem and divalproex with decrease in valproic acid levels. Pharmacy was consulted to assist with dosage adjustment/monitoring. Sepsis had resolved but patient was showing ongoing deficits with mobility and ADLs. CIR was recommended due to functional decline.     Hospital Course: Maria [name] was admitted to rehab 05/18/2020 for inpatient therapies to consist of PT and OT at least three hours five days a week. Past admission physiatrist, therapy team and rehab RN have worked together to provide customized collaborative inpatient rehab. She is tolerating IV antibiotics without SE. Her po intake has improved and she is continent of bowel and bladder. Follow up CBC showed H/H to be relatively stable. She has been seizure free during her stay. Pharmacy has been assisting with dosing of divalproex and dose was titrated up to 1000 mg am/1250 mg pm as follow up Valproic acid level 49 on 12/27. She continues on low dose Seroquel as well as ativan to help manage her anxiety. Chronic constipation has been managed with use of colace bid.   Follow up BMET showed renal status and lytes to be WNL. CT abdomen/pelvis repeated on 12/24 per ID recommendations and showed that abscess had resolved with residual stranding. Dr. Gale Journey was consulted for input and recommended 4 week antibiotic course with end date 06/10/20 with weekly labs. Midline was placed PICC line was placed prior to discharge due to need for prolonged IV antibiotics. Her vitals have been monitored on TID basis and lopressor was decreased  to 25 mg bid due to hypotension. Blood pressure are controlled on current dose. Cough has improved on Advair MDI bid and scheduled Tessalon perles. She has made steady progress during her stay and currently at supervision level. She  will continue to receive follow up HHPT, [place] and [place] [facility] after discharge.    Rehab course: During patient's stay in rehab weekly team conferences were held to monitor patient's progress, set goals and discuss barriers to discharge. At admission, patient required mod assist with mobility and basic ADL tasks. She has had improvement in activity tolerance, balance, postural control as well as ability to compensate for deficits.  She is able to complete ADL tasks with supervision. She requires supervision for transfers and for mobility. Family education was completed regarding all aspects of care.    Discharge disposition: 01-Home or Self Care  Diet: Regular.   Special Instructions: 1. HHRN to draw Valproic acid level on 01/03 with results to Dr. [place] Crane.  2. Patient will need dose of Depakote decreased once off ertapenem (end date 06/10/20).    Discharge Instructions    [facility] pharmacist to adjust dose for Vancomycin, Aminoglycosides and other anti-infective therapies as requested by physician.   Complete by: As directed    [facility] pharmacist to adjust dose for Vancomycin, Aminoglycosides and other anti-infective therapies as requested by physician.   Complete by: As directed    [facility] to provide Cath Flo 70m   Complete by: As directed    Administer for PICC line occlusion and as ordered by physician for other access device issues.   [facility] to provide Cath Flo 2512m  Complete by: As directed    Administer for PICC line occlusion and as ordered by physician for other access device issues.   Ambulatory referral to Physical Medicine Rehab   Complete by: As directed    1-2 weeks TC appt   Anaphylaxis Kit: Provided to treat any anaphylactic reaction to the medication being provided to the patient if First Dose or when requested by physician   Complete by: As directed    Epinephrine 8m68ml vial / amp: Administer 0.12mg412m0.12ml)48mbcutaneously once for moderate to severe anaphylaxis, nurse to call physician and pharmacy when reaction occurs and call 911 if needed for immediate care   Diphenhydramine 50mg/18mV vial: Administer 25-50mg I712m PRN for first dose reaction, rash, itching, mild reaction, nurse to call physician and pharmacy when reaction occurs   Sodium Chloride 0.9% NS 500ml IV56mminister if needed for hypovolemic blood pressure drop or as ordered by physician after call to physician with anaphylactic reaction   Anaphylaxis Kit: Provided to treat any anaphylactic reaction to the medication being provided to the patient if First Dose or when requested by physician   Complete by: As directed    Epinephrine 8mg/ml v32m / amp: Administer 0.12mg (0.12m77msubcu47meously once for moderate to severe anaphylaxis, nurse to call physician and pharmacy when reaction occurs and call 911 if needed for immediate care   Diphenhydramine 50mg/ml IV 57m: Administer 25-50mg IV/IM P912mor first dose reaction, rash, itching, mild reaction, nurse to call physician and pharmacy when reaction occurs   Sodium Chloride 0.9% NS 500ml IV: Admi26mer if needed for hypovolemic blood pressure drop or as ordered by physician after call to physician with anaphylactic reaction   Change dressing on IV access line weekly and PRN   Complete by: As directed    Change dressing  on IV access line weekly and PRN   Complete by: As directed    Flush IV access with Sodium Chloride 0.9% and Heparin 10 units/ml or 100 units/ml   Complete by: As directed    Flush IV access with Sodium Chloride 0.9% and Heparin 10 units/ml or 100 units/ml   Complete by: As directed    Home infusion instructions - [facility]   Complete by: As directed    Instructions: Flush IV access with Sodium Chloride 0.9% and Heparin 10units/ml or 100units/ml   Change dressing on IV access line: Weekly and PRN   Instructions Cath Flo 34m: Administer for PICC Line  occlusion and as ordered by physician for other access device   [facility] pharmacist to adjust dose for: Vancomycin, Aminoglycosides and other anti-infective therapies as requested by physician   Home infusion instructions - [facility]   Complete by: As directed    Instructions: Flush IV access with Sodium Chloride 0.9% and Heparin 10units/ml or 100units/ml   Change dressing on IV access line: Weekly and PRN   Instructions Cath Flo 246m Administer for PICC Line occlusion and as ordered by physician for other access device   [facility] pharmacist to adjust dose for: Vancomycin, Aminoglycosides and other anti-infective therapies as requested by physician   Method of administration may be changed at the discretion of home infusion pharmacist based upon assessment of the patient and/or caregiver's ability to self-administer the medication ordered   Complete by: As directed    Method of administration may be changed at the discretion of home infusion pharmacist based upon assessment of the patient and/or caregiver's ability to self-administer the medication ordered   Complete by: As directed    Outpatient Parenteral Antibiotic Therapy Information Antibiotic: Ertapenem (Invanz) IVPB; Indications for use: ESBL ecoli bacteremia; End Date: 06/10/2020   Complete by: As directed    Antibiotic: Ertapenem (IColbert EwingIVPB   Indications for use: ESBL ecoli bacteremia   End Date: 06/10/2020     Allergies as of 06/01/2020      Reactions   Codeine Nausea And Vomiting   Lamictal [lamotrigine] Rash      Medication List    STOP taking these medications   Calcium 600-400 MG-UNIT Chew   clonazePAM 1 MG tablet Commonly known as: KLONOPIN Replaced by: clonazePAM 0.25 MG disintegrating tablet   diazepam 2 MG tablet Commonly known as: VALIUM   ipratropium-albuterol 0.5-2.5 (3) MG/3ML Soln Commonly known as: DUONEB   levocetirizine 5 MG tablet Commonly known as: XYZAL    oxybutynin 5 MG 24 hr tablet Commonly known as: DITROPAN-XL     TAKE these medications   acetaminophen 500 MG tablet Commonly known as: TYLENOL Take 1 tablet (500 mg total) by mouth every 6 (six) hours as needed for moderate pain.   atorvastatin 20 MG tablet Commonly known as: LIPITOR Take 20 mg by mouth daily.   benzonatate 200 MG capsule Commonly known as: TESSALON Take 1 capsule (200 mg total) by mouth 3 (three) times daily.   clonazePAM 0.25 MG disintegrating tablet--Rx # 60 pills Commonly known as: KLONOPIN Take 1 tablet (0.25 mg total) by mouth 2 (two) times daily. Replaces: clonazePAM 1 MG tablet   divalproex 500 MG DR tablet Commonly known as: DEPAKOTE Take 2 tablets (1,000 mg total) by mouth daily. What changed: when to take this   divalproex 250 MG DR tablet Commonly known as: DEPAKOTE Take 5 tablets (1,250 mg total) by mouth at bedtime. What  changed:   medication strength  how much to take  when to take this   docusate sodium 100 MG capsule Commonly known as: COLACE Take 1 capsule (100 mg total) by mouth 2 (two) times daily.   ertapenem  IVPB Commonly known as: INVANZ Inject 1 g into the vein daily. Indication:  ESBL First Dose: {no Last Day of Therapy:  06/10/20 Labs - Once weekly:  CBC/D and BMP, Labs - Every other week:  ESR and CRP Method of administration: Mini-Bag Plus / Gravity Method of administration may be changed at the discretion of home infusion pharmacist based upon assessment of the patient and/or caregiver's ability to self-administer the medication ordered.   ertapenem 1,000 mg in sodium chloride 0.9 % 100 mL Inject 1,000 mg into the vein daily.   feeding supplement Liqd Take 237 mLs by mouth 3 (three) times daily between meals.   Fluticasone-Salmeterol 100-50 MCG/DOSE Aepb Commonly known as: Advair Diskus Inhale 1 puff into the lungs in the morning and at bedtime.   lacosamide 200 MG Tabs tablet Commonly known as:  VIMPAT Take 1 tablet (200 mg total) by mouth 2 (two) times daily.   LORazepam 0.5 MG tablet--Rx# 60 pills Commonly known as: ATIVAN Take 1 tablet (0.5 mg total) by mouth every 8 (eight) hours.   melatonin 3 MG Tabs tablet Take 1 tablet (3 mg total) by mouth at bedtime as needed (insomina).   metoprolol tartrate 25 MG tablet Commonly known as: LOPRESSOR Take 1 tablet (25 mg total) by mouth 2 (two) times daily. What changed:   medication strength  how much to take   omeprazole 40 MG capsule Commonly known as: PRILOSEC Take 1 capsule (40 mg total) by mouth daily.   pediatric multivitamin chewable tablet Chew 1 tablet by mouth daily.   QUEtiapine 25 MG tablet Commonly known as: SEROQUEL Take 1 tablet (25 mg total) by mouth 2 (two) times daily.            Discharge Care Instructions  (From admission, onward)         Start     Ordered   05/31/20 0000  Change dressing on IV access line weekly and PRN  (Home infusion instructions - [facility] )        05/31/20 0731   05/31/20 0000  Change dressing on IV access line weekly and PRN  (Home infusion instructions - [facility] )        05/31/20 7680          Follow-up Information    Celene Squibb, MD. Call.   Specialty: Internal Medicine Why: for post hospital follow up Contact information: [place] [facility] [address] [phone]        Robley Fries, MD Follow up.   Specialty: Urology Contact information: [place] [address] [phone]        Mignon Pine, DO Follow up on 06/09/2020.   Specialties: Internal Medicine, Infectious Diseases Why: Appointment at 2:30 pm. Contact information: [address] [place] [address] [phone]        Izora Ribas, MD Follow up.   Specialty: Physical Medicine and Rehabilitation Why: Office will call you to make an appointment Contact information: [address] [place] [address] [phone]               Signed: Bary Leriche 06/02/2020, 3:35 PM

## 2020-05-30 ENCOUNTER — Inpatient Hospital Stay (HOSPITAL_COMMUNITY): Payer: Medicare Other | Admitting: Occupational Therapy

## 2020-05-30 ENCOUNTER — Inpatient Hospital Stay (HOSPITAL_COMMUNITY): Payer: Medicare Other

## 2020-05-30 MED ORDER — SODIUM CHLORIDE 0.9% FLUSH
10.0000 mL | Freq: Two times a day (BID) | INTRAVENOUS | Status: DC
  Administered 2020-05-30 – 2020-06-01 (×5): 10 mL

## 2020-05-30 MED ORDER — METOPROLOL TARTRATE 25 MG PO TABS
25.0000 mg | ORAL_TABLET | Freq: Two times a day (BID) | ORAL | Status: DC
  Administered 2020-05-30 – 2020-06-01 (×4): 25 mg via ORAL
  Filled 2020-05-30 (×4): qty 1

## 2020-05-30 MED ORDER — SODIUM CHLORIDE 0.9% FLUSH: 10.0000 mL | INTRAVENOUS | Status: DC | PRN

## 2020-05-30 NOTE — Progress Notes (Signed)
Occupational Therapy Session Note  Patient Details  Name: Maria [name] MRN: [id number] Date of Birth: 1961-04-15  Today's Date: 05/30/2020  Session 1 OT Individual Time: 3606-7703 OT Individual Time Calculation (min): 40 min   Session 2 OT Individual Time: 1231-1316 OT Individual Time Calculation (min): 45 min   Short Term Goals: Week 2:  OT Short Term Goal 1 (Week 2): LTG=STG 2/2 ELOS  Skilled Therapeutic Interventions/Progress Updates:  Session 1   Pt greeted semi-reclined in bed asleep, easy to wake and agreeable to OT treatment session with encouragement. Pt declined any bathing/dressing this morning but agreeable to get up and go to the bathroom. Pt ambulated throughout room with close supervision. Pt with successful void of bladder and able to complete her own peri-care. Pt then maintained standing while completing grooming tasks at the sink with supervision. Pt walked around the bed to straighten out the sheets and covers with supervision and no overt LOB. UB there-ex seated EOB 3 sets of 10 arm raises. OT talked with patient about the music artist Elvis Coil and her affinity for Freddy Mercury while seated EOB.  Pt returned to bed with supervision and left semi-reclined with bed alarm on, call bell in reach, and needs met.   Session 2 Pt greeted semi-reclined in bed and agreeable to OT treatment session. Pt completed bed mobility mod I. She then ambulated into bathroom and voided bladder with supervision. Pt completed 3/3 toileting steps with supervision. Pt then ambulated to therapy gym with RW and supervision. UB there-ex using 3 lb dowel rod 3 sets of 10 chest press, bicep curl, and straight arm raises. B UE ball toss activity incorporating overhead toss to challenge UEs and seated balance. Pt ambulated back to room at end of session with RW and supervision. Pt left semi-reclined in bed with bed alarm on, call bell in reach, and needs met.    Therapy Documentation Precautions:   Precautions Precautions: Fall Restrictions Weight Bearing Restrictions: No Pain:  denies pain  Therapy/Group: Individual Therapy  Valma Cava 05/30/2020, 1:17 PM

## 2020-05-30 NOTE — Progress Notes (Signed)
[place] PHYSICAL MEDICINE & REHABILITATION PROGRESS NOTE   Subjective/Complaints: No complaints this morning.  At baseline cognition as per family Continued cough noted- continue tessalon pearls Team conference was held today  ROS: limited due to cognition  Objective:   No results found. Recent Labs    05/29/20 0557  WBC 6.5  HGB 9.8*  HCT 30.4*  PLT 239   Recent Labs    05/29/20 0557  NA 141  K 4.1  CL 105  CO2 23  GLUCOSE 97  BUN 9  CREATININE 0.87  CALCIUM 9.3    Intake/Output Summary (Last 24 hours) at 05/30/2020 1207 Last data filed at 05/30/2020 0747 Gross per 24 hour  Intake 938 ml  Output --  Net 938 ml    Physical Exam: Vital Signs Blood pressure (!) 105/56, pulse 61, temperature 98.3 F (36.8 C), temperature source Oral, resp. rate 14, height 5\' 6"  (1.676 m), weight 55.8 kg, SpO2 97 %.  Gen: no distress, normal appearing HEENT: oral mucosa pink and moist, NCAT Cardio: Reg rate Chest: normal effort, normal rate of breathing Abd: soft, non-distended Psych: flat affect- no change Skin: Clean and intact without signs of breakdown Neuro: fair insight and awareness.. Cranial nerves 2-12 are intact. Sensory exam is normal. Reflexes are 2+ in all 4's. Fine motor coordination is intact. No tremors. Motor function is grossly 4- to 4/5 in uppers and 3+ to 4/5 prox to distal in lowers..  Musculoskeletal: Full ROM, No pain with AROM or PROM in the neck, trunk, or extremities. Posture appropriate  Assessment/Plan: 1. Functional deficits which require 3+ hours per day of interdisciplinary therapy in a comprehensive inpatient rehab setting.  Physiatrist is providing close team supervision and 24 hour management of active medical problems listed below.  Physiatrist and rehab team continue to assess barriers to discharge/monitor patient progress toward functional and medical goals  Care Tool:  Bathing    Body parts bathed by patient: Right arm,Left  arm,Chest,Abdomen,Front perineal area,Buttocks,Right upper leg,Left upper leg,Face,Right lower leg,Left lower leg   Body parts bathed by helper: Right lower leg,Left lower leg     Bathing assist Assist Level: Supervision/Verbal cueing     Upper Body Dressing/Undressing Upper body dressing   What is the patient wearing?: Pull over shirt    Upper body assist Assist Level: Set up assist    Lower Body Dressing/Undressing Lower body dressing      What is the patient wearing?: Underwear/pull up,Pants     Lower body assist Assist for lower body dressing: Supervision/Verbal cueing     Toileting Toileting    Toileting assist Assist for toileting: Supervision/Verbal cueing     Transfers Chair/bed transfer  Transfers assist     Chair/bed transfer assist level: Supervision/Verbal cueing     Locomotion Ambulation   Ambulation assist      Assist level: Contact Guard/Touching assist Assistive device: No Device Max distance: >300'   Walk 10 feet activity   Assist     Assist level: Contact Guard/Touching assist Assistive device: No Device   Walk 50 feet activity   Assist    Assist level: Contact Guard/Touching assist Assistive device: No Device    Walk 150 feet activity   Assist Walk 150 feet activity did not occur: Safety/medical concerns  Assist level: Contact Guard/Touching assist Assistive device: No Device    Walk 10 feet on uneven surface  activity   Assist Walk 10 feet on uneven surfaces activity did not occur: Safety/medical concerns  Assist level: Contact Guard/Touching assist Assistive device: Hand held assist   Wheelchair     Assist Will patient use wheelchair at discharge?: No             Wheelchair 50 feet with 2 turns activity    Assist            Wheelchair 150 feet activity     Assist          Blood pressure (!) 105/56, pulse 61, temperature 98.3 F (36.8 C), temperature source Oral, resp. rate  14, height 5\' 6"  (1.676 m), weight 55.8 kg, SpO2 97 %.  Medical Problem List and Plan: 1.Functional and mobility deficitssecondary to debility after multiple medical issues, seizure disorder -patient may shower -ELOS/Goals: Planned discharge is 06/01/2020, supervision with PT, OT, SLP  - team providing positive reinforcement  -Continue CIR  -Interdisciplinary Team Conference today  2. Antithrombotics: -DVT/anticoagulation:Pharmaceutical:Lovenox -antiplatelet therapy: N/A 3. Pain Management:Pain is currently well controlled- continue tylenol prn 4. Mood:LCSW to follow for evaluation and support. -antipsychotic agents: N/A 5. Neuropsych: This patientis not fullycapable of making decisions on herown behalf. 6. Skin/Wound Care:Routine pressure relief measures. 7. Fluids/Electrolytes/Nutrition:  12/21 -offering supplemental shakes to boost protein   -encouraging po 8. ESBL UTI with bacteremia: On Invanz-->repeat CT abdomen/pelvis 12/24 to help determine course of treatment  Reviewed ID note- CT pelvis improved.  9. Question of renal abscess:Leucocytosis resolving on Invanz  12/20 wbc 7k, 6.5 on 12/17- monitor weekly  10. H/o seizures: Breakthrough seizure due to drug interaction--will have pharmacy continue to dose Depakote while on Invanz. Continue Vimpat bid with Depakote 1000/500 mg for now. On ativan taper.   12/27: appreciate pharm recommendations for increased VPA dose given low level. 11. Anxiety/agitation: Continue home dose Klonopin 0.25 mg bid with Seroquel 25 mg bid for now.    -mood fairly up beat and positive today  12/21-22 anxiety better- continue current regimen. More participatory overall with therapy 12. Ongoing cough: CXR clear.  Cough noted, continue Tessalon perles with dulera bid.  13. Chronic constipation: Continue colace bid--monitor as now on antibiotics.  12/20 moving bowels. Encouraging her to eat  more consistently 14. Sleepy  12/23- wants naps after AM therapy- will try and plan for this.  12/24- napping this AM  15. Anemia: Hgb 9.8 12/27- monitor weekly.  16. On lopressor but HR and BP are low: decrease to 25mg  BID    LOS: 12 days A FACE TO FACE EVALUATION WAS PERFORMED  1/28 P Trynity Skousen 05/30/2020, 12:07 PM

## 2020-05-30 NOTE — Progress Notes (Signed)
Patient ID: Maria [name], female   DOB: 05-24-1961, 59 y.o.   MRN: [id number]  Covering for primary SW, Auria.  Patient referral sent to [facility] for IV ABX at discharge. Sw will follow up  Lavera Guise, [place] [phone]

## 2020-05-30 NOTE — Patient Care Conference (Signed)
Inpatient RehabilitationTeam Conference and Plan of Care Update Date: 05/30/2020   Time: 10:36 AM    Patient Name: Maria [name]      Medical Record Number: [id number]  Date of Birth: 07-06-1960 Sex: Female         Room/Bed: 4W03C/4W03C-01 Payor Info: Payor: MEDICARE / Plan: MEDICARE PART A AND B / Product Type: *No Product type* /    Admit Date/Time:  05/18/2020  3:53 PM  Primary Diagnosis:  [facility] Problems: Principal Problem:   Debility Active Problems:   Seizure disorder ([place])   AKI (acute kidney injury) ([place])   Sepsis secondary to UTI ([place])   ESBL (extended spectrum beta-lactamase) producing bacteria infection   Bacteremia due to Escherichia coli    Expected Discharge Date: Expected Discharge Date: 06/01/20  Team Members Present: Physician leading conference: Dr. Leeroy Cha Care Coodinator Present: Erlene Quan, BSW; Creig Hines, RN, BSN, [place] Nurse Present: Rayne Du, LPN PT Present: Tereasa Coop, PT OT Present: Cherylynn Ridges, OT SLP Present: Weston Anna, SLP PPS Coordinator present : Ileana Ladd, Burna Mortimer, SLP     Current Status/Progress Goal Weekly Team Focus  Bowel/Bladder   Continent of B/B [facility] 12/27  maintain continence  Toilet q2h   Swallow/Nutrition/ Hydration             ADL's   Supervision/CGA  Supervision  Self-care retraining, activity tolerance, pt/family education   Mobility   supervision bed mobility, transfers, CGA ambulation ~300' without AD  Supervision  balance, ambulation, DC prep   Communication             Safety/Cognition/ Behavioral Observations            Pain   denies pain         Skin   skin intact           Discharge Planning:  Pt to d/c to home with 56 y.o. mother who is a retired Therapist, sports and physically able to provide care. Pt sister intends to transport pt to all medical appointments, and provide assistance as needed. Family education completed on 12/27 with pt sister, mother,  and brother (visiting from [place]). DME: 3in1 BSC ordered.   Team Discussion: Continent B/B, will discharge home with mother and sister will provide assistance as needed. Will go home on IV antibiotics until 06/10/2020. OT reports patient's family completed family education yesterday. PT reports patient's family completed family education yesterday. SLP discharged patient yesterday. Patient on target to meet rehab goals: yes, patient has met goals.  *See Care Plan and progress notes for long and short-term goals.   Revisions to Treatment Plan:  Patient has met goals Continue to provide emotional support Teaching Needs: Family education completed  Current Barriers to Discharge: Decreased caregiver support, Home enviroment access/layout, Lack of/limited family support, Medication compliance and Behavior  Possible Resolutions to Barriers: Continue current medications, continue IV antibiotics until completion, provide emotional support to patient and family.     Medical Summary Current Status: Continuing on IV antibiotics, dysphagia, impaired congition  Barriers to Discharge: Medical stability;IV antibiotics  Barriers to Discharge Comments: Continuing on IV antibiotics, dysphagia, impaired cognition Possible Resolutions to [employer] Focus: continue IV antibitioics   Continued Need for Acute Rehabilitation Level of Care: The patient requires daily medical management by a physician with specialized training in physical medicine and rehabilitation for the following reasons: Direction of a multidisciplinary physical rehabilitation program to maximize functional independence : Yes Medical management of patient stability for increased activity during  participation in an intensive rehabilitation regime.: Yes Analysis of laboratory values and/or radiology reports with any subsequent need for medication adjustment and/or medical intervention. : Yes   I attest that I was present, lead the  team conference, and concur with the assessment and plan of the team.   Cristi Loron 05/30/2020, 3:33 PM

## 2020-05-30 NOTE — Progress Notes (Signed)
Physical Therapy Session Note  Patient Details  Name: Maria [name] MRN: [id number] Date of Birth: 06/17/1960  Today's Date: 05/30/2020 PT Individual Time: 8182-9937 PT Individual Time Calculation (min): 60 min  and Today's Date: 05/30/2020 PT Missed Time: 45 Minutes Missed Time Reason: Patient unwilling to participate  Short Term Goals: Week 2:  PT Short Term Goal 1 (Week 2): STGs= LTGs  Skilled Therapeutic Interventions/Progress Updates:     Pt received supine in bed and agrees to therapy. Complains of pain in newly placed midline in L arm. Number not provided. PT provides rest breaks and repositioning to manage pain. Supine to sit mod(I) with use of bed features. Pt perform sit to stand with close supervision and cues for hand placement and positioning. Pt transported outside via [facility] for time management and energy conservation. Pt is very insistent that she will not walk outside but with gentle education on benefits of gait training outdoors and persistence, pt eventually agreeable to ambulate. Pt ambulates x200' without AD and with CGA. WC transport back inside due to pt requesting to use Nustep. Stand pivot to Nustep with close supervision. Pt completes x15:00 total on Nustep at workload of 3 with SPM>60. Performed for strength and endurance training as well as reciprocal coordination. Pt requires several extended seated rest breaks during activity. Pt requesting to return to room. Pt ambulates x100' back to room with close supervision and improved gait speed and increased stride length. Sit to supine with supervision for cues on positioning. Pt left supine in bed with alarm intact and all needs within reach.  Pt misses initial 30 minutes of skilled PT due to requesting to rest and misses additional 15 minutes at end of session due to fatigue.  Therapy Documentation Precautions:  Precautions Precautions: Fall Restrictions Weight Bearing Restrictions: No   Therapy/Group: Individual  Therapy  Beau Fanny, PT, DPT 05/30/2020, 3:34 PM

## 2020-05-30 NOTE — Progress Notes (Signed)
Team Conference Report to Patient/Family  Team Conference discussion was reviewed with the patient and caregiver, including goals, any changes in plan of care and target discharge date.  Patient and caregiver express understanding and are in agreement.  The patient has a target discharge date of 06/01/20.  [name] 05/30/2020, 1:23 PM Patient ID: Maria [name], female   DOB: 1960-10-08, 59 y.o.   MRN: [id number]

## 2020-05-30 NOTE — Progress Notes (Signed)
PHARMACY CONSULT NOTE FOR:  OUTPATIENT  PARENTERAL ANTIBIOTIC THERAPY (OPAT)  Indication: E.coli bacteremia (ESBL producing) Regimen: ertapenem 1g IV q24h End date: 06/10/2020  IV antibiotic discharge orders are pended. To discharging provider:  please sign these orders via discharge navigator,  Select New Orders & click on the button choice - Manage This Unsigned Work.     Thank you for allowing pharmacy to be a part of this patient's care.  Mickeal Skinner 05/30/2020, 12:33 PM

## 2020-05-30 NOTE — Progress Notes (Signed)
Occupational Therapy Discharge Summary  Patient Details  Name: ASHERAH LAVOY MRN: [id number] Date of Birth: [birth year]  Patient has met 85 of 11 long term goals due to improved activity tolerance, improved balance, postural control, ability to compensate for deficits and improved awareness.  Patient to discharge at overall Supervision level.  Patient's care partner is independent to provide the necessary physical and cognitive assistance at discharge.     Reasons goals not met: n/a  Recommendation:  Patient will benefit from ongoing skilled OT services in home health setting to continue to advance functional skills in the area of BADL and Reduce care partner burden.  Equipment: 3-in-1 BSC  Reasons for discharge: treatment goals met and discharge from hospital  Patient/family agrees with progress made and goals achieved: Yes  OT Discharge Precautions/Restrictions  Precautions Precautions: Fall Restrictions Weight Bearing Restrictions: No Pain Pain Assessment Pain Scale: 0-10 Pain Score: 0-No pain ADL ADL Eating: Independent Grooming: Setup Upper Body Bathing: Supervision/safety Where Assessed-Upper Body Bathing: Edge of bed Lower Body Bathing: Supervision/safety Where Assessed-Lower Body Bathing: Other (Comment) (toilet) Upper Body Dressing: Supervision/safety Where Assessed-Upper Body Dressing: Edge of bed Lower Body Dressing: Supervision/safety Where Assessed-Lower Body Dressing: Other (Comment) (toilet) Toileting: Supervision/safety Where Assessed-Toileting: Glass blower/designer: Close supervision Toilet Transfer Method: Human resources officer: Close supervison Cognition Overall Cognitive Status: History of cognitive impairments - at baseline Arousal/Alertness: Awake/alert Orientation Level: Oriented X4 Sensation Coordination Gross Motor Movements are Fluid and Coordinated: No Fine Motor Movements are Fluid and Coordinated: Yes Motor  Motor Motor  - Discharge Observations: Generalized weakness, improved since eval Mobility  Bed Mobility Rolling Right: Independent Rolling Left: Independent Supine to Sit: Independent Sit to Supine: Independent Transfers Sit to Stand: Supervision/Verbal cueing Stand to Sit: Supervision/Verbal cueing  Balance Static Sitting Balance Static Sitting - Level of Assistance: 6: Modified independent (Device/Increase time) Dynamic Sitting Balance Dynamic Sitting - Level of Assistance: 6: Modified independent (Device/Increase time) Static Standing Balance Static Standing - Level of Assistance: 5: Stand by assistance Dynamic Standing Balance Dynamic Standing - Level of Assistance: 5: Stand by assistance Extremity/Trunk Assessment RUE Assessment RUE Assessment: Within Functional Limits LUE Assessment LUE Assessment: Within Functional Limits   Daneen Schick Doe 05/30/2020, 9:35 AM

## 2020-05-31 ENCOUNTER — Inpatient Hospital Stay (HOSPITAL_COMMUNITY): Payer: Medicare Other

## 2020-05-31 ENCOUNTER — Other Ambulatory Visit (HOSPITAL_COMMUNITY): Payer: Self-pay | Admitting: Physician Assistant

## 2020-05-31 MED ORDER — LACOSAMIDE 200 MG PO TABS: 200.0000 mg | ORAL_TABLET | Freq: Two times a day (BID) | ORAL | 1 refills | Status: DC

## 2020-05-31 MED ORDER — BENZONATATE 200 MG PO CAPS: 200.0000 mg | ORAL_CAPSULE | Freq: Three times a day (TID) | ORAL | 0 refills | Status: DC

## 2020-05-31 MED ORDER — ERTAPENEM IV (FOR PTA / DISCHARGE USE ONLY): 1.0000 g | INTRAVENOUS | 0 refills | Status: DC

## 2020-05-31 MED ORDER — FLUTICASONE-SALMETEROL 100-50 MCG/DOSE IN AEPB: 1.0000 | INHALATION_SPRAY | Freq: Two times a day (BID) | RESPIRATORY_TRACT | 2 refills | Status: DC

## 2020-05-31 MED ORDER — SODIUM CHLORIDE 0.9 % IV SOLN
1.0000 g | Freq: Every day | INTRAVENOUS | Status: DC
  Administered 2020-05-31 – 2020-06-01 (×2): 1000 mg via INTRAVENOUS
  Filled 2020-05-31 (×4): qty 1

## 2020-05-31 MED ORDER — MELATONIN 3 MG PO TABS: 3.0000 mg | ORAL_TABLET | Freq: Every evening | ORAL | 0 refills | Status: DC | PRN

## 2020-05-31 MED ORDER — QUETIAPINE FUMARATE 25 MG PO TABS: 25.0000 mg | ORAL_TABLET | Freq: Two times a day (BID) | ORAL | 0 refills | Status: DC

## 2020-05-31 MED ORDER — METOPROLOL TARTRATE 25 MG PO TABS: 25.0000 mg | ORAL_TABLET | Freq: Two times a day (BID) | ORAL | 0 refills | Status: DC

## 2020-05-31 MED ORDER — CLONAZEPAM 0.25 MG PO TBDP: 0.2500 mg | ORAL_TABLET | Freq: Two times a day (BID) | ORAL | 0 refills | Status: DC

## 2020-05-31 MED ORDER — DIVALPROEX SODIUM 250 MG PO DR TAB: 1250.0000 mg | DELAYED_RELEASE_TABLET | Freq: Every day | ORAL | 0 refills | Status: DC

## 2020-05-31 MED ORDER — MOMETASONE FURO-FORMOTEROL FUM 200-5 MCG/ACT IN AERO: 2.0000 | INHALATION_SPRAY | Freq: Two times a day (BID) | RESPIRATORY_TRACT | 0 refills | Status: DC

## 2020-05-31 MED ORDER — DIVALPROEX SODIUM 500 MG PO DR TAB: 1000.0000 mg | DELAYED_RELEASE_TABLET | Freq: Every day | ORAL | 0 refills | Status: DC

## 2020-05-31 MED ORDER — LORAZEPAM 0.5 MG PO TABS: 0.5000 mg | ORAL_TABLET | Freq: Three times a day (TID) | ORAL | 0 refills | Status: DC

## 2020-05-31 MED FILL — QUETIAPINE FUMARATE 25 MG T: 25 | 10 days supply | Qty: 20 | Fill #0

## 2020-05-31 MED FILL — CLONAZEPAM 0.25 MG TBDP: 0.25 | 30 days supply | Qty: 60 | Fill #0

## 2020-05-31 MED FILL — DIVALPROEX SOD DR 250 MG TA: 250 | 30 days supply | Qty: 150 | Fill #0

## 2020-05-31 MED FILL — MELATONIN 3 MG TABS: 3 | 20 days supply | Qty: 20 | Fill #0

## 2020-05-31 MED FILL — LORazepam 0.5 MG TABS: 0.5 | 20 days supply | Qty: 60 | Fill #0

## 2020-05-31 MED FILL — ADVAIR 100/50 DISKUS: 100-50 | 30 days supply | Qty: 60 | Fill #0

## 2020-05-31 MED FILL — DIVALPROEX SOD DR 500 MG TA: 500 | 30 days supply | Qty: 60 | Fill #0

## 2020-05-31 MED FILL — VIMPAT 200 MG TABLET: 200 | 30 days supply | Qty: 60 | Fill #0

## 2020-05-31 MED FILL — METOPROLOL TARTRATE 25 MG T: 25 | 30 days supply | Qty: 60 | Fill #0

## 2020-05-31 MED FILL — BENZONATATE 200 MG CAPS: 200 | 6 days supply | Qty: 20 | Fill #0

## 2020-05-31 NOTE — Discharge Instructions (Signed)
Inpatient Rehab Discharge Instructions  Maria [name] Discharge date and time:    Activities/Precautions/ Functional Status: Activity: no lifting, driving, or strenuous exercise till cleared by MD Diet: regular diet Wound Care: keep wound clean and dry    Functional status:  ___ No restrictions     ___ Walk up steps independently ___ 24/7 supervision/assistance   ___ Walk up steps with assistance ___ Intermittent supervision/assistance  ___ Bathe/dress independently ___ Walk with walker     ___ Bathe/dress with assistance ___ Walk Independently    ___ Shower independently ___ Walk with assistance    ___ Shower with assistance ___ No alcohol     ___ Return to work/school ________  COMMUNITY REFERRALS UPON DISCHARGE:    Home Health:   PT     OT                          Agency: [facility]  Phone: [phone]   Medical Equipment/Items Ordered: 3in1 bedside commode                                                 Agency/Supplier: [employer] [facility] [phone]    Special Instructions: Please allow 24-72 hours for scheduling after discharge  Intravenous Invanz 1 g every 24 hours through 06/10/2020 and stop    My questions have been answered and I understand these instructions. I will adhere to these goals and the provided educational materials after my discharge from the hospital.  Patient/Caregiver Signature _______________________________ Date __________  Clinician Signature _______________________________________ Date __________  Please bring this form and your medication list with you to all your follow-up doctor's appointments.

## 2020-05-31 NOTE — Progress Notes (Signed)
Physical Therapy Session Note  Patient Details  Name: Maria [name] MRN: [id number] Date of Birth: February 07, 1961  Today's Date: 05/31/2020 PT Individual Time: 1420-1520 PT Individual Time Calculation (min): 60 min   Short Term Goals: Week 2:  PT Short Term Goal 1 (Week 2): STGs= LTGs  Skilled Therapeutic Interventions/Progress Updates:     Patient in bed with family and RN in the room for family teaching for IV site use at home upon PT arrival. Patient alert and agreeable to PT session. Patient denied pain during session.  Therapeutic Activity: Bed Mobility: Patient performed supine to/from sit independently in a flat bed without use of bed rails.  Transfers: Patient performed sit to/from stand with supervision throughout session. Patient stood in the ADL kitchen and discussed how to prepare her favorite meals. Provided education for how patient can assist with meal prep at home in sitting and without use of sharp or powered appliances at this time for safety. Patient attentive throughout education, but fatiguing and more distracted at ~5 min.   Therapeutic Exercise: Patient performed the following exercises with verbal and tactile cues for proper technique. -Patient ambulated >350 feet and >100 ft x2 without an AD with supervision for safety and 1 LOB, patient appropriately recovered and stopped to "reset" without cues or additional assistance. Ambulated with decreased gait speed, decreased step length and height, increased B hip and knee flexion in stance, forward trunk lean, and downward head gaze. Provided verbal cues for increased gait speed and arm swing for increased cardiovascular endurance with functional gait. -NuStep x3 in at level 5 and 3 min at level 6 cued to maintain >80 steps per minute for increased cardiovascular challenge, HR 91-108, provided 1 min rest break, then patient continued at level 6 x4 min with increased challenge and cues to maintain >80 steps per minute due to  fatigue and decreased attention to task over a prolonged period, HR 102 after  Patient sitting EOB with family in the room at end of session with breaks locked, bed alarm set, and all needs within reach. Patient's family asked about patient's current functional level and stairs, as there is a full flight to the basement. Educated on patient's current level of function and instructed that patient should not performing stairs until HHPT is able to assess patient's mobility on home steps for safety due to limited need for patient to use these steps and to reduce fall risk. Patient and family stated understanding.    Therapy Documentation Precautions:  Precautions Precautions: Fall Precaution Comments: Contact Precautions. Restrictions Weight Bearing Restrictions: No   Therapy/Group: Individual Therapy  Solash Tullo L Renelle Stegenga PT, DPT  05/31/2020, 8:43 PM

## 2020-05-31 NOTE — Progress Notes (Signed)
[facility] PHYSICAL MEDICINE & REHABILITATION PROGRESS NOTE   Subjective/Complaints: No complaints this morning. She needs to use the bathroom.   ROS: limited due to cognition  Objective:   No results found. Recent Labs    05/29/20 0557  WBC 6.5  HGB 9.8*  HCT 30.4*  PLT 239   Recent Labs    05/29/20 0557  NA 141  K 4.1  CL 105  CO2 23  GLUCOSE 97  BUN 9  CREATININE 0.87  CALCIUM 9.3    Intake/Output Summary (Last 24 hours) at 05/31/2020 0859 Last data filed at 05/30/2020 1324 Gross per 24 hour  Intake 240 ml  Output --  Net 240 ml    Physical Exam: Vital Signs Blood pressure 110/66, pulse 69, temperature 97.9 F (36.6 C), resp. rate 15, height 5\' 6"  (1.676 m), weight 56.2 kg, SpO2 99 %.  Gen: no distress, normal appearing HEENT: oral mucosa pink and moist, NCAT Cardio: Reg rate Chest: normal effort, normal rate of breathing Abd: soft, non-distended Ext: no edema Psych: flat affect- no change Skin: Clean and intact without signs of breakdown Neuro: fair insight and awareness.. Cranial nerves 2-12 are intact. Sensory exam is normal. Reflexes are 2+ in all 4's. Fine motor coordination is intact. No tremors. Motor function is grossly 4- to 4/5 in uppers and 3+ to 4/5 prox to distal in lowers..  Musculoskeletal: Full ROM, No pain with AROM or PROM in the neck, trunk, or extremities. Posture appropriate  Assessment/Plan: 1. Functional deficits which require 3+ hours per day of interdisciplinary therapy in a comprehensive inpatient rehab setting.  Physiatrist is providing close team supervision and 24 hour management of active medical problems listed below.  Physiatrist and rehab team continue to assess barriers to discharge/monitor patient progress toward functional and medical goals  Care Tool:  Bathing    Body parts bathed by patient: Right arm,Left arm,Chest,Abdomen,Front perineal area,Buttocks,Right upper leg,Left upper leg,Face,Right lower leg,Left  lower leg   Body parts bathed by helper: Right lower leg,Left lower leg     Bathing assist Assist Level: Supervision/Verbal cueing     Upper Body Dressing/Undressing Upper body dressing   What is the patient wearing?: Pull over shirt    Upper body assist Assist Level: Set up assist    Lower Body Dressing/Undressing Lower body dressing      What is the patient wearing?: Underwear/pull up,Pants     Lower body assist Assist for lower body dressing: Supervision/Verbal cueing     Toileting Toileting    Toileting assist Assist for toileting: Supervision/Verbal cueing     Transfers Chair/bed transfer  Transfers assist     Chair/bed transfer assist level: Supervision/Verbal cueing     Locomotion Ambulation   Ambulation assist      Assist level: Contact Guard/Touching assist Assistive device: No Device Max distance: 200'   Walk 10 feet activity   Assist     Assist level: Contact Guard/Touching assist Assistive device: No Device   Walk 50 feet activity   Assist    Assist level: Contact Guard/Touching assist Assistive device: No Device    Walk 150 feet activity   Assist Walk 150 feet activity did not occur: Safety/medical concerns  Assist level: Contact Guard/Touching assist Assistive device: No Device    Walk 10 feet on uneven surface  activity   Assist Walk 10 feet on uneven surfaces activity did not occur: Safety/medical concerns   Assist level: Contact Guard/Touching assist Assistive device: Hand held assist  Wheelchair     Assist Will patient use wheelchair at discharge?: No             Wheelchair 50 feet with 2 turns activity    Assist            Wheelchair 150 feet activity     Assist          Blood pressure 110/66, pulse 69, temperature 97.9 F (36.6 C), resp. rate 15, height 5\' 6"  (1.676 m), weight 56.2 kg, SpO2 99 %.  Medical Problem List and Plan: 1.Functional and mobility  deficitssecondary to debility after multiple medical issues, seizure disorder -patient may shower -ELOS/Goals: Planned discharge is 06/01/2020, supervision with PT, OT, SLP  - team providing positive reinforcement  Continue CIR  -DC tomorrow with hospital f/u with Dr. Ranell Patrick on February 3rd 2. Antithrombotics: -DVT/anticoagulation:Pharmaceutical:Lovenox -antiplatelet therapy: N/A 3. Pain Management:Pain is currently well controlled- continue tylenol prn 4. Mood:LCSW to follow for evaluation and support. -antipsychotic agents: N/A 5. Neuropsych: This patientis not fullycapable of making decisions on herown behalf. 6. Skin/Wound Care:Routine pressure relief measures. 7. Fluids/Electrolytes/Nutrition:  12/21 -offering supplemental shakes to boost protein   -encouraging po 8. ESBL UTI with bacteremia: On Invanz-->repeat CT abdomen/pelvis 12/24 to help determine course of treatment  Reviewed ID note- CT pelvis improved.  9. Question of renal abscess:Leucocytosis resolving on Invanz  12/20 wbc 7k, 6.5 on 12/17- monitor weekly  10. H/o seizures: Breakthrough seizure due to drug interaction--will have pharmacy continue to dose Depakote while on Invanz. Continue Vimpat bid with Depakote 1000/500 mg for now. On ativan taper.   12/27: appreciate pharm recommendations for increased VPA dose given low level. 11. Anxiety/agitation: Continue home dose Klonopin 0.25 mg bid with Seroquel 25 mg bid for now.    -mood fairly up beat and positive today  12/21-22 anxiety better- continue current regimen. More participatory overall with therapy 12. Ongoing cough: CXR clear.  Cough noted, continue Tessalon perles with dulera bid.  13. Chronic constipation: Continue colace bid--monitor as now on antibiotics.  12/20 moving bowels. Encouraging her to eat more consistently 14. Sleepy  12/23- wants naps after AM therapy- will try and plan for  this.  12/24- napping this AM  15. Anemia: Hgb 9.8 12/27- monitor outpatient.  16. On lopressor but HR and BP are low: decrease to 25mg  BID  12/29: better controlled.     LOS: 13 days A FACE TO FACE EVALUATION WAS PERFORMED  Martha Clan P Carlton Sweaney 05/31/2020, 8:59 AM

## 2020-05-31 NOTE — Progress Notes (Signed)
Physical Therapy Discharge Summary  Patient Details  Name: FRAYA UEDA MRN: [id number] Date of Birth: 11-23-1960  Today's Date: 05/31/2020 PT Individual Time: 1101-1159 PT Individual Time Calculation (min): 58 min    Patient has met 9 of 9 long term goals due to improved activity tolerance, improved balance, improved postural control and increased strength.  Patient to discharge at an ambulatory level Supervision.   Patient's care partner is independent to provide the necessary physical assistance at discharge.  Reasons goals not met: NA  Recommendation:  Patient will benefit from ongoing skilled PT services in home health setting to continue to advance safe functional mobility, address ongoing impairments in strength, balance, ambulation, and minimize fall risk.  Equipment: No equipment provided  Reasons for discharge: treatment goals met and discharge from hospital  Patient/family agrees with progress made and goals achieved: Yes   Skilled therapeutic Interventions: Pt received supin ein bed and agrees to therapy. No complaint of pain. Bed mobility during session independent. Pt perform stand step transfer to toilet with supervision for safety. Following, pt puts on shoes with minA from PT. Pt ambulates >300' with close supervision and cues for upright gaze to improve posture and balance and increasing gait speed to decrease risk for falls. Pt performs car transfers and ramp navigation with supervision. Pt then takes extended seated rest break. Pt performs x12 steps with bilateral hand rails and CGA with cues on step sequence. Pt requests to perform Nustep activity. Ambulates to Nustep with supervision. Pt completes x5:00 at workload of 4 with SPM>50. Performed for strength and endurance training as well as reciprocal coordination. Pt ambulates back to room with cues to not reach out for object or hall rails. Left supine in bed with all needs within reach.  PT  Discharge Precautions/Restrictions Precautions Precautions: Fall Precaution Comments: Contact Precautions. Restrictions Weight Bearing Restrictions: No Vision/Perception  Perception Perception: Within Functional Limits Praxis Praxis: Intact  Cognition Overall Cognitive Status: History of cognitive impairments - at baseline Arousal/Alertness: Awake/alert Orientation Level: Oriented X4 Safety/Judgment: Impaired Sensation Sensation Light Touch: Appears Intact Coordination Gross Motor Movements are Fluid and Coordinated: No Fine Motor Movements are Fluid and Coordinated: Yes Motor  Motor Motor: Abnormal postural alignment and control Motor - Discharge Observations: Generalized weakness, improved since eval  Mobility Bed Mobility Bed Mobility: Sit to Supine;Supine to Sit Supine to Sit: Independent Sit to Supine: Independent Transfers Transfers: Sit to Stand;Stand to Sit;Stand Pivot Transfers Sit to Stand: Supervision/Verbal cueing Stand to Sit: Supervision/Verbal cueing Stand Pivot Transfers: Supervision/Verbal cueing Stand Pivot Transfer Details: Verbal cues for precautions/safety Transfer (Assistive device): None Locomotion  Gait Ambulation: Yes Gait Assistance: Supervision/Verbal cueing Gait Distance (Feet): 300 Feet Assistive device: None Gait Assistance Details: Verbal cues for gait pattern Gait Gait: Yes Gait Pattern: Impaired Gait Pattern: Decreased stride length;Decreased trunk rotation;Wide base of support Gait velocity: decreased Stairs / Additional Locomotion Stairs: Yes Stairs Assistance: Contact Guard/Touching assist Stair Management Technique: Two rails Number of Stairs: 12 Height of Stairs: 3 Ramp: Supervision/Verbal cueing Curb: Contact Guard/Touching assist Wheelchair Mobility Wheelchair Mobility: No  Trunk/Postural Assessment  Cervical Assessment Cervical Assessment: Exceptions to [facility] (forward head) Thoracic Assessment Thoracic Assessment:  Exceptions to [facility] (rounded shoulders) Lumbar Assessment Lumbar Assessment: Exceptions to [facility] (posterior pelvic tilt) Postural Control Postural Control: Deficits on evaluation Protective Responses: delayed but improved from eval  Balance Balance Balance Assessed: Yes Static Sitting Balance Static Sitting - Balance Support: Feet supported Static Sitting - Level of Assistance: 6: Modified independent (Device/Increase time) Dynamic  Sitting Balance Dynamic Sitting - Balance Support: Feet supported Dynamic Sitting - Level of Assistance: 6: Modified independent (Device/Increase time) Static Standing Balance Static Standing - Balance Support: No upper extremity supported Static Standing - Level of Assistance: 5: Stand by assistance Dynamic Standing Balance Dynamic Standing - Balance Support: No upper extremity supported Dynamic Standing - Level of Assistance: 5: Stand by assistance Extremity Assessment  RLE Assessment RLE Assessment: Exceptions to [facility] General Strength Comments: Grossly 4/5 LLE Assessment LLE Assessment: Exceptions to [facility] General Strength Comments: Grossly 4/5    Breck Coons, PT, DPT 05/31/2020, 1:17 PM

## 2020-05-31 NOTE — Progress Notes (Signed)
Occupational Therapy Session Note  Patient Details  Name: Maria [name] MRN: [id number] Date of Birth: [birth year]  Today's Date: 05/31/2020 OT Individual Time: 4098-1191 OT Individual Time Calculation (min): 54 min    Short Term Goals: Week 1:  OT Short Term Goal 1 (Week 1): Patient will complete shower transfer with min A OT Short Term Goal 1 - Progress (Week 1): Met OT Short Term Goal 2 (Week 1): Pt will complete 2/3 toileting steps OT Short Term Goal 2 - Progress (Week 1): Met OT Short Term Goal 3 (Week 1): Pt will maintain standing balance with BADL tasks with min A OT Short Term Goal 3 - Progress (Week 1): Met  Skilled Therapeutic Interventions/Progress Updates:    Pt received in bed with RN administering pills. Pt initially resistant to BADL performance, however with OT agreeaing to allow pt to "ride the bike," pt willing to get OOB and dressed. Pt completes full ADL at EOB at sit to stand level with Supervision overall. Pt requires VC for RW management to gather clothing from low drawer with VC for gathering all needed items in one trip. Pt with occasional posteiror bias in standing with no needed steadying only cuing. Pt stands at sink for grooming with MOD I. Pt completes functional mobility with RW to/from dayroom with S overall. Pt completes 2x5 min on NuStep for conditioning with VC for keeping STM >50 on workload 5. Pt requires 2 min for rest break in between intervals. Last interval LEs only with cueing for focus on LE strengthening. Pt requires 23.55 seconds for 5 sit to stands. Pt requires pushing up from EOB to power up into standing and demo posterior bias in stance. Exited session with pt seated in bed, exit alarm on and call light in reach   Therapy Documentation Precautions:  Precautions Precautions: Fall Restrictions Weight Bearing Restrictions: No General:   Vital Signs: Therapy Vitals Temp: 97.9 F (36.6 C) Pulse Rate: 69 Resp: 15 BP: 110/66 Patient  Position (if appropriate): Lying Oxygen Therapy SpO2: 99 % O2 Device: Room Air Pain:   ADL: ADL Eating: Independent Grooming: Setup Upper Body Bathing: Supervision/safety Where Assessed-Upper Body Bathing: Edge of bed Lower Body Bathing: Supervision/safety Where Assessed-Lower Body Bathing: Other (Comment) (toilet) Upper Body Dressing: Supervision/safety Where Assessed-Upper Body Dressing: Edge of bed Lower Body Dressing: Supervision/safety Where Assessed-Lower Body Dressing: Other (Comment) (toilet) Toileting: Supervision/safety Where Assessed-Toileting: Glass blower/designer: Close supervision Toilet Transfer Method: Ambulating Tub/Shower Transfer: Close supervison Vision   Perception    Praxis   Exercises:   Other Treatments:     Therapy/Group: Individual Therapy  Tonny Branch 05/31/2020, 6:55 AM

## 2020-06-01 NOTE — Progress Notes (Signed)
[place] PHYSICAL MEDICINE & REHABILITATION PROGRESS NOTE   Subjective/Complaints: No complaints this morning Asks about the pharmacy to which her medications will be sent Vitals are stable Stable for d/c today  ROS: limited due to cognition  Objective:   No results found. No results for input(s): WBC, HGB, HCT, PLT in the last 72 hours. No results for input(s): NA, K, CL, CO2, GLUCOSE, BUN, CREATININE, CALCIUM in the last 72 hours.  Intake/Output Summary (Last 24 hours) at 06/01/2020 1302 Last data filed at 06/01/2020 0945 Gross per 24 hour  Intake 605 ml  Output --  Net 605 ml    Physical Exam: Vital Signs Blood pressure 120/70, pulse 66, temperature 97.7 F (36.5 C), resp. rate 15, height 5\' 6"  (1.676 m), weight 56.2 kg, SpO2 98 %.  Gen: no distress, normal appearing HEENT: oral mucosa pink and moist, NCAT Cardio: Reg rate Chest: normal effort, normal rate of breathing Abd: soft, non-distended Ext: no edema Psych: flat affect- no change Skin: Clean and intact without signs of breakdown Neuro: fair insight and awareness.. Cranial nerves 2-12 are intact. Sensory exam is normal. Reflexes are 2+ in all 4's. Fine motor coordination is intact. No tremors. Motor function is grossly 4- to 4/5 in uppers and 3+ to 4/5 prox to distal in lowers..  Musculoskeletal: Full ROM, No pain with AROM or PROM in the neck, trunk, or extremities. Posture appropriate  Assessment/Plan: 1. Functional deficits which require 3+ hours per day of interdisciplinary therapy in a comprehensive inpatient rehab setting.  Physiatrist is providing close team supervision and 24 hour management of active medical problems listed below.  Physiatrist and rehab team continue to assess barriers to discharge/monitor patient progress toward functional and medical goals  Care Tool:  Bathing    Body parts bathed by patient: Right arm,Left arm,Chest,Abdomen,Front perineal area,Buttocks,Right upper leg,Left  upper leg,Face,Right lower leg,Left lower leg   Body parts bathed by helper: Right lower leg,Left lower leg     Bathing assist Assist Level: Supervision/Verbal cueing     Upper Body Dressing/Undressing Upper body dressing   What is the patient wearing?: Pull over shirt    Upper body assist Assist Level: Supervision/Verbal cueing    Lower Body Dressing/Undressing Lower body dressing      What is the patient wearing?: Underwear/pull up,Pants     Lower body assist Assist for lower body dressing: Supervision/Verbal cueing     Toileting Toileting    Toileting assist Assist for toileting: Supervision/Verbal cueing     Transfers Chair/bed transfer  Transfers assist     Chair/bed transfer assist level: Supervision/Verbal cueing     Locomotion Ambulation   Ambulation assist      Assist level: Supervision/Verbal cueing Assistive device: No Device Max distance: >300'   Walk 10 feet activity   Assist     Assist level: Supervision/Verbal cueing Assistive device: No Device   Walk 50 feet activity   Assist    Assist level: Supervision/Verbal cueing Assistive device: No Device    Walk 150 feet activity   Assist Walk 150 feet activity did not occur: Safety/medical concerns  Assist level: Supervision/Verbal cueing Assistive device: No Device    Walk 10 feet on uneven surface  activity   Assist Walk 10 feet on uneven surfaces activity did not occur: Safety/medical concerns   Assist level: Supervision/Verbal cueing Assistive device: Hand held assist   Wheelchair     Assist Will patient use wheelchair at discharge?: No  Wheelchair 50 feet with 2 turns activity    Assist            Wheelchair 150 feet activity     Assist          Blood pressure 120/70, pulse 66, temperature 97.7 F (36.5 C), resp. rate 15, height 5\' 6"  (1.676 m), weight 56.2 kg, SpO2 98 %.  Medical Problem List and Plan: 1.Functional  and mobility deficitssecondary to debility after multiple medical issues, seizure disorder -patient may shower -ELOS/Goals: Planned discharge is 06/01/2020, supervision with PT, OT, SLP  - team providing positive reinforcement  -DC today with hospital f/u with Dr. 06/03/2020 on February 10th 2. Antithrombotics: -DVT/anticoagulation:Pharmaceutical:Lovenox -antiplatelet therapy: N/A 3. Pain Management:Pain is currently well controlled- continue tylenol prn 4. Mood:LCSW to follow for evaluation and support. -antipsychotic agents: N/A 5. Neuropsych: This patientis not fullycapable of making decisions on herown behalf. 6. Skin/Wound Care:Routine pressure relief measures. 7. Fluids/Electrolytes/Nutrition:  12/21 -offering supplemental shakes to boost protein   -encouraging po 8. ESBL UTI with bacteremia: On Invanz-->repeat CT abdomen/pelvis 12/24 to help determine course of treatment  Reviewed ID note- CT pelvis improved.  9. Question of renal abscess:Leucocytosis resolving on Invanz  12/20 wbc 7k, 6.5 on 12/17- monitor weekly  10. H/o seizures: Breakthrough seizure due to drug interaction--will have pharmacy continue to dose Depakote while on Invanz. Continue Vimpat bid with Depakote 1000/500 mg for now. On ativan taper.   12/27: appreciate pharm recommendations for increased VPA dose given low level. 11. Anxiety/agitation: Continue home dose Klonopin 0.25 mg bid with Seroquel 25 mg bid for now.    -mood fairly up beat and positive today  12/21-22 anxiety better- continue current regimen. More participatory overall with therapy 12. Ongoing cough: CXR clear.  Cough noted, continue Tessalon perles with dulera bid.  13. Chronic constipation: Continue colace bid--monitor as now on antibiotics.  12/20 moving bowels. Encouraging her to eat more consistently 14. Sleepy  12/23- wants naps after AM therapy- will try and plan for  this.  12/24- napping this AM  15. Anemia: Hgb 9.8 12/27- monitor outpatient.  16. On lopressor but HR and BP are low: decrease to 25mg  BID  12/29-30: much better controlled- continue current regimen.     >30 minutes spent in discharge of patient including review of medications and follow-up appointments, physical examination, and in answering all patient's questions  LOS: 14 days A FACE TO FACE EVALUATION WAS PERFORMED  Maria [name] 06/01/2020, 1:02 PM

## 2020-06-01 NOTE — Telephone Encounter (Signed)
Called following mychart message.  Left message with call back number if patient still wishes to pass along question to Dr Earlene Plater. Andree Coss, RN

## 2020-06-01 NOTE — Progress Notes (Signed)
Patient discharged to home, accompanied by her mother.

## 2020-06-02 DIAGNOSIS — F79 Unspecified intellectual disabilities: Secondary | ICD-10-CM | POA: Diagnosis not present

## 2020-06-02 DIAGNOSIS — A4151 Sepsis due to Escherichia coli [E. coli]: Secondary | ICD-10-CM | POA: Diagnosis not present

## 2020-06-02 DIAGNOSIS — Z7951 Long term (current) use of inhaled steroids: Secondary | ICD-10-CM | POA: Diagnosis not present

## 2020-06-02 DIAGNOSIS — D696 Thrombocytopenia, unspecified: Secondary | ICD-10-CM | POA: Diagnosis not present

## 2020-06-02 DIAGNOSIS — G40909 Epilepsy, unspecified, not intractable, without status epilepticus: Secondary | ICD-10-CM | POA: Diagnosis not present

## 2020-06-02 DIAGNOSIS — Z9181 History of falling: Secondary | ICD-10-CM | POA: Diagnosis not present

## 2020-06-02 DIAGNOSIS — I5032 Chronic diastolic (congestive) heart failure: Secondary | ICD-10-CM | POA: Diagnosis not present

## 2020-06-02 DIAGNOSIS — D649 Anemia, unspecified: Secondary | ICD-10-CM | POA: Diagnosis not present

## 2020-06-02 DIAGNOSIS — N151 Renal and perinephric abscess: Secondary | ICD-10-CM | POA: Diagnosis not present

## 2020-06-02 DIAGNOSIS — Z5181 Encounter for therapeutic drug level monitoring: Secondary | ICD-10-CM | POA: Diagnosis not present

## 2020-06-02 DIAGNOSIS — N10 Acute tubulo-interstitial nephritis: Secondary | ICD-10-CM | POA: Diagnosis not present

## 2020-06-02 DIAGNOSIS — R6520 Severe sepsis without septic shock: Secondary | ICD-10-CM | POA: Diagnosis not present

## 2020-06-02 DIAGNOSIS — Z1612 Extended spectrum beta lactamase (ESBL) resistance: Secondary | ICD-10-CM | POA: Diagnosis not present

## 2020-06-02 DIAGNOSIS — K5909 Other constipation: Secondary | ICD-10-CM | POA: Diagnosis not present

## 2020-06-02 DIAGNOSIS — Z452 Encounter for adjustment and management of vascular access device: Secondary | ICD-10-CM | POA: Diagnosis not present

## 2020-06-02 DIAGNOSIS — R1310 Dysphagia, unspecified: Secondary | ICD-10-CM | POA: Diagnosis not present

## 2020-06-02 DIAGNOSIS — F419 Anxiety disorder, unspecified: Secondary | ICD-10-CM | POA: Diagnosis not present

## 2020-06-02 DIAGNOSIS — K219 Gastro-esophageal reflux disease without esophagitis: Secondary | ICD-10-CM | POA: Diagnosis not present

## 2020-06-02 DIAGNOSIS — Z792 Long term (current) use of antibiotics: Secondary | ICD-10-CM | POA: Diagnosis not present

## 2020-06-05 DIAGNOSIS — A4151 Sepsis due to Escherichia coli [E. coli]: Secondary | ICD-10-CM | POA: Diagnosis not present

## 2020-06-05 DIAGNOSIS — N151 Renal and perinephric abscess: Secondary | ICD-10-CM | POA: Diagnosis not present

## 2020-06-05 DIAGNOSIS — G40909 Epilepsy, unspecified, not intractable, without status epilepticus: Secondary | ICD-10-CM | POA: Diagnosis not present

## 2020-06-05 DIAGNOSIS — I5032 Chronic diastolic (congestive) heart failure: Secondary | ICD-10-CM | POA: Diagnosis not present

## 2020-06-05 DIAGNOSIS — R6520 Severe sepsis without septic shock: Secondary | ICD-10-CM | POA: Diagnosis not present

## 2020-06-05 DIAGNOSIS — N10 Acute tubulo-interstitial nephritis: Secondary | ICD-10-CM | POA: Diagnosis not present

## 2020-06-06 ENCOUNTER — Other Ambulatory Visit: Payer: Self-pay

## 2020-06-06 ENCOUNTER — Encounter (HOSPITAL_COMMUNITY): Payer: Self-pay | Admitting: Emergency Medicine

## 2020-06-06 ENCOUNTER — Telehealth: Payer: Self-pay | Admitting: Neurology

## 2020-06-06 ENCOUNTER — Emergency Department (HOSPITAL_COMMUNITY)
Admission: EM | Admit: 2020-06-06 | Discharge: 2020-06-07 | Disposition: A | Discharge status: Home / Self Care | Payer: Medicare Other | Attending: Emergency Medicine | Admitting: Emergency Medicine

## 2020-06-06 ENCOUNTER — Telehealth: Payer: Self-pay

## 2020-06-06 DIAGNOSIS — R1110 Vomiting, unspecified: Secondary | ICD-10-CM | POA: Diagnosis present

## 2020-06-06 DIAGNOSIS — N261 Atrophy of kidney (terminal): Secondary | ICD-10-CM | POA: Diagnosis not present

## 2020-06-06 DIAGNOSIS — Z20822 Contact with and (suspected) exposure to covid-19: Secondary | ICD-10-CM | POA: Diagnosis not present

## 2020-06-06 DIAGNOSIS — N12 Tubulo-interstitial nephritis, not specified as acute or chronic: Secondary | ICD-10-CM

## 2020-06-06 DIAGNOSIS — I5032 Chronic diastolic (congestive) heart failure: Secondary | ICD-10-CM | POA: Diagnosis not present

## 2020-06-06 DIAGNOSIS — G40909 Epilepsy, unspecified, not intractable, without status epilepticus: Secondary | ICD-10-CM | POA: Diagnosis not present

## 2020-06-06 DIAGNOSIS — N151 Renal and perinephric abscess: Secondary | ICD-10-CM | POA: Diagnosis not present

## 2020-06-06 DIAGNOSIS — R112 Nausea with vomiting, unspecified: Secondary | ICD-10-CM | POA: Diagnosis not present

## 2020-06-06 DIAGNOSIS — N10 Acute tubulo-interstitial nephritis: Secondary | ICD-10-CM | POA: Diagnosis not present

## 2020-06-06 DIAGNOSIS — Z87891 Personal history of nicotine dependence: Secondary | ICD-10-CM | POA: Insufficient documentation

## 2020-06-06 DIAGNOSIS — I700 Atherosclerosis of aorta: Secondary | ICD-10-CM | POA: Diagnosis not present

## 2020-06-06 DIAGNOSIS — R6520 Severe sepsis without septic shock: Secondary | ICD-10-CM | POA: Diagnosis not present

## 2020-06-06 DIAGNOSIS — A4151 Sepsis due to Escherichia coli [E. coli]: Secondary | ICD-10-CM | POA: Diagnosis not present

## 2020-06-06 LAB — CBC
HCT: 36.3 % (ref 36.0–46.0)
Hemoglobin: 11.2 g/dL — ABNORMAL LOW (ref 12.0–15.0)
MCH: 28.8 pg (ref 26.0–34.0)
MCHC: 30.9 g/dL (ref 30.0–36.0)
MCV: 93.3 fL (ref 80.0–100.0)
Platelets: 183 10*3/uL (ref 150–400)
RBC: 3.89 MIL/uL (ref 3.87–5.11)
RDW: 15.5 % (ref 11.5–15.5)
WBC: 4.6 10*3/uL (ref 4.0–10.5)
nRBC: 0 % (ref 0.0–0.2)

## 2020-06-06 LAB — COMPREHENSIVE METABOLIC PANEL
ALT: 34 U/L (ref 0–44)
AST: 32 U/L (ref 15–41)
Albumin: 3.4 g/dL — ABNORMAL LOW (ref 3.5–5.0)
Alkaline Phosphatase: 95 U/L (ref 38–126)
Anion gap: 11 (ref 5–15)
BUN: 6 mg/dL (ref 6–20)
CO2: 25 mmol/L (ref 22–32)
Calcium: 9.9 mg/dL (ref 8.9–10.3)
Chloride: 108 mmol/L (ref 98–111)
Creatinine, Ser: 0.82 mg/dL (ref 0.44–1.00)
GFR, Estimated: >60 mL/min (ref >60)
Glucose, Bld: 101 mg/dL — ABNORMAL HIGH (ref 70–99)
Potassium: 4 mmol/L (ref 3.5–5.1)
Sodium: 144 mmol/L (ref 135–145)
Total Bilirubin: 0.7 mg/dL (ref 0.3–1.2)
Total Protein: 6.7 g/dL (ref 6.5–8.1)

## 2020-06-06 LAB — LIPASE, BLOOD: Lipase: 23 U/L (ref 11–51)

## 2020-06-06 NOTE — Addendum Note (Signed)
Addended by: York Spaniel on: 06/06/2020 05:32 PM   Modules accepted: Orders

## 2020-06-06 NOTE — Telephone Encounter (Signed)
Thanks for the message.  Is she having bowel movements? Her hospital discharge summary notes that her chronic constipation was managed with use of colace BID.  I'm not sure if she is still taking this, but if not, constipation may be causing more of her symptoms since her abscess was resolved on the most recent CT scan 12/24.  If she is not having consistent bowel movements then I would ask her to resume colace and possibly add Senna to her bowel regimen.  Both are available OTC.

## 2020-06-06 NOTE — Telephone Encounter (Signed)
Pt's mother(on DPR) is asking for a call to discuss pt's seizure medications

## 2020-06-06 NOTE — Telephone Encounter (Signed)
The patient was in the hospital for 45 days with a renal abscess and septicemia.  She was somewhat delirious, there is some question whether she may have a seizure but it looks like her Depakote levels dropped down to nothing, probably was not getting the medication.  They have altered the medication doses for the Depakote, she will go back to what she was taking 500 mg twice daily.  She will remain on the current dose of Vimpat 200 mg twice daily.  She continues on the clonazepam and will taper off of the Ativan.  She will stop the Seroquel.  They will call if she has any concerns about her daughter.

## 2020-06-06 NOTE — Telephone Encounter (Signed)
Patient is taking stool softener and is having regular stools.   She has an appointment scheduled for Jan. 12, 2022 which they are concerned about since she is having the pain and the vomiting is present.   Since she was not eating her Mother advised liquid soups which she has tolerated and I  advised to ensure she stays hydrated with liquids.   Please advise.

## 2020-06-06 NOTE — Telephone Encounter (Signed)
I called speaking to mother of pt.  Pt was admitted to [facility] for UTI 04-14-20 then L kidney infection and abscess.  See Epic notes.  Mother is giving the medication as prescribed by [place] (vimpat 200mg  po bid, depakote 500mg  2 tabs daily.  Clonazepam 0.5mg  po bid.  This is a little different then what is on discharge summary.  Pt has L had tremor (one today).  Having some nausea today.  seroquel ordered she states is not giving to her, and ativan 0.5mg  daily. She wants to make sure that this is ok.  Had EEG/VEEG in hospital.  Please advise.

## 2020-06-06 NOTE — Telephone Encounter (Signed)
Mother calling:  Milady has been experiencing stomach pain and  vomited once  Her Mother states she has not been eating.   It does not appear to be associated with the Pincus Sanes which she started on last Friday.  So far she has completed 5 doses of Invanz.   Patient states her stomach hurts all day and is mostly on left side which is where the kidney abscess is located.  Patient states it feels like a pinching pain that rates on pain scale of 8 currently and was worse yesterday.    Information shared is very vague, Mother asked daughter questions as I asked her.   The IV nurse is coming tomorrow for dressing change.   Please advise.

## 2020-06-06 NOTE — ED Triage Notes (Signed)
Pt reports she was discharged from hospital Thursday for same thing - diagnosed with UTI, cyst on kidney. Pt reports left sided abd pain with vomiting. Pt reports today after taking her medication today she became "uncoordinated" and threw up everything. Brother has been assisting with her medications at home. Hx of MR. Pt's brother at bedside.

## 2020-06-06 NOTE — Telephone Encounter (Signed)
Since she is having regular bowel movements, I am not sure what the etiology of her abdominal pain/vomiting is.  Her last CT showed resolution of abscess and improvement in pyelonephritis.  Since she is not having fevers, I am not certain this is related to her infection or not.  Getting a repeat CT would be the way to further evaluate her abdomen.  Is she amenable to having another CT vs getting worked in for a clinic visit?  I am on the inside service this week so I'm not sure what availability there is.

## 2020-06-07 ENCOUNTER — Emergency Department (HOSPITAL_COMMUNITY): Payer: Medicare Other

## 2020-06-07 DIAGNOSIS — N12 Tubulo-interstitial nephritis, not specified as acute or chronic: Secondary | ICD-10-CM | POA: Diagnosis not present

## 2020-06-07 DIAGNOSIS — N151 Renal and perinephric abscess: Secondary | ICD-10-CM | POA: Diagnosis not present

## 2020-06-07 DIAGNOSIS — I700 Atherosclerosis of aorta: Secondary | ICD-10-CM | POA: Diagnosis not present

## 2020-06-07 DIAGNOSIS — N261 Atrophy of kidney (terminal): Secondary | ICD-10-CM | POA: Diagnosis not present

## 2020-06-07 LAB — URINALYSIS, ROUTINE W REFLEX MICROSCOPIC
Bacteria, UA: NONE SEEN
Bilirubin Urine: NEGATIVE
Glucose, UA: NEGATIVE mg/dL
Hgb urine dipstick: NEGATIVE
Ketones, ur: NEGATIVE mg/dL
Nitrite: NEGATIVE
Protein, ur: NEGATIVE mg/dL
Specific Gravity, Urine: 1.012 (ref 1.005–1.030)
pH: 5 (ref 5.0–8.0)

## 2020-06-07 LAB — RESP PANEL BY RT-PCR (FLU A&B, COVID) ARPGX2
Influenza A by PCR: NEGATIVE
Influenza B by PCR: NEGATIVE
SARS Coronavirus 2 by RT PCR: NEGATIVE

## 2020-06-07 MED ORDER — ONDANSETRON HCL 4 MG/2ML IJ SOLN
4.0000 mg | Freq: Once | INTRAMUSCULAR | Status: AC
  Administered 2020-06-07: 4 mg via INTRAVENOUS
  Filled 2020-06-07: qty 2

## 2020-06-07 MED ORDER — ONDANSETRON 4 MG PO TBDP: ORAL_TABLET | ORAL | 0 refills | Status: DC

## 2020-06-07 MED ORDER — FENTANYL CITRATE (PF) 100 MCG/2ML IJ SOLN
30.0000 ug | INTRAMUSCULAR | Status: DC | PRN
  Administered 2020-06-07: 30 ug via INTRAVENOUS
  Filled 2020-06-07: qty 2

## 2020-06-07 MED ORDER — SODIUM CHLORIDE 0.9 % IV BOLUS
1000.0000 mL | Freq: Once | INTRAVENOUS | Status: AC
  Administered 2020-06-07: 1000 mL via INTRAVENOUS

## 2020-06-07 MED ORDER — IOHEXOL 300 MG/ML  SOLN
80.0000 mL | Freq: Once | INTRAMUSCULAR | Status: AC
  Administered 2020-06-07: 80 mL via INTRAVENOUS

## 2020-06-07 MED ORDER — HYDROCODONE-ACETAMINOPHEN 5-325 MG PO TABS
1.0000 | ORAL_TABLET | Freq: Four times a day (QID) | ORAL | 0 refills | Status: DC | PRN
Start: 2020-06-07 — End: 2020-07-14

## 2020-06-07 NOTE — ED Notes (Signed)
Patient transported to CT 

## 2020-06-07 NOTE — ED Notes (Signed)
Pt given [place] sandwich, crackers, and water-- eating at this time.

## 2020-06-07 NOTE — Telephone Encounter (Signed)
Return call to patient's Mother who states the patient was in the emergency room all night on Tuesday .  She has been referred to a kidney specialist .  I advised to please keep scheduled appointment with Dr Gabriel Rainwater, RN

## 2020-06-07 NOTE — ED Provider Notes (Signed)
[facility] EMERGENCY DEPARTMENT Provider Note   CSN: [id number] Arrival date & time: 06/06/20  2131     History Chief Complaint  Patient presents with  . Abdominal Pain  . Emesis    Maria [name] is a 60 y.o. female.  Patient presents with vomiting and left flank pain.  Patient was discharged from the hospital Thursday for complicated UTI and has a PICC line and has been receiving IV antibiotics for which she has 2 doses left.  Brother has been assisting with her medications in her care.  History of MR.  Patient has not vomited since being in the ER.  Feels similar to when she was admitted.        Past Medical History:  Diagnosis Date  . Constipation   . Dysphagia   . Fracture    R foot  . GERD (gastroesophageal reflux disease)   . History of shingles 10/2016  . Mental retardation    lesion in head  . Seizures ([place])    "not fully controlled on max doses of meds" (Neuro ofc note 12/2014)  . Thrombocytopenia ([place])   . Tremor     Patient Active Problem List   Diagnosis Date Noted  . ESBL (extended spectrum beta-lactamase) producing bacteria infection 05/12/2020  . Bacteremia due to Escherichia coli 05/12/2020  . Severe sepsis ([place]) 05/12/2020  . Debility 05/05/2020  . Pressure injury of skin 04/24/2020  . Palliative care by specialist   . Pyelonephritis of left kidney 04/14/2020  . Renal abscess, left 04/14/2020  . Leukocytosis 04/14/2020  . Fever 04/14/2020  . LLQ abdominal pain 04/14/2020  . Dysphagia   . AKI (acute kidney injury) ([place])   . Sepsis secondary to UTI ([place])   . Pain in left ankle and joints of left foot 12/02/2019  . Positive colorectal cancer screening using Cologuard test 10/28/2018  . Posterior tibial tendinitis, right leg 04/23/2016  . Pain in right foot 04/12/2016  . Acute encephalopathy   . Myoclonus   . Frequent falls 11/09/2015  . Fall 11/09/2015  . Chest pain 03/16/2014  . Upper abdominal pain 03/16/2014  .  Seizure disorder ([place]) 03/16/2014  . 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 Generalized convulsive epilepsy ([place]) 10/06/2012  . DNR (do not resuscitate) discussion 10/06/2012  . Intellectual disability   . Thrombocytopenia ([place]) 05/23/2011  . Seizures ([place]) 03/21/2011  . GERD (gastroesophageal reflux disease) 03/21/2011  . CLOSED FRACTURE OF ACROMIAL END OF CLAVICLE 07/13/2008    Past Surgical History:  Procedure Laterality Date  . BIOPSY  09/16/2014   Procedure: BIOPSY;  Surgeon: Rogene Houston, MD;  Location: [facility];  Service: Endoscopy;;  . CATARACT EXTRACTION     both eyes, May of 2015  . COLONOSCOPY    . COLONOSCOPY WITH PROPOFOL N/A 11/20/2018   Procedure: COLONOSCOPY WITH PROPOFOL;  Surgeon: Rogene Houston, MD;  Location: [facility] ENDO SUITE;  Service: Endoscopy;  Laterality: N/A;  . ESOPHAGEAL DILATION N/A 09/16/2014   Procedure: ESOPHAGEAL DILATION WITH 54FR MALONEY DILATOR;  Surgeon: Rogene Houston, MD;  Location: [facility];  Service: Endoscopy;  Laterality: N/A;  . ESOPHAGOGASTRODUODENOSCOPY (EGD) WITH PROPOFOL N/A 09/16/2014   Procedure: ESOPHAGOGASTRODUODENOSCOPY (EGD) WITH PROPOFOL;  Surgeon: Rogene Houston, MD;  Location: [facility];  Service: Endoscopy;  Laterality: N/A;  . MOUTH SURGERY    . POLYPECTOMY  11/20/2018   Procedure: POLYPECTOMY;  Surgeon: Rogene Houston, MD;  Location: [facility];  Service: Endoscopy;;  colon  . Skin graft to gum Right 08/2013  . TONSILLECTOMY AND ADENOIDECTOMY    . TOTAL ABDOMINAL HYSTERECTOMY       OB History   No obstetric history on file.     Family History  Problem Relation Age of Onset  . High Cholesterol Mother   . High blood pressure Mother   . Diabetes Father     Social History   Tobacco Use  . Smoking status: Former Smoker    Packs/day: 1.50    Years: 15.00    Pack years: 22.50    Types: Cigarettes    Quit date: 05/22/2006    Years since quitting: 14.0  . Smokeless tobacco: Never Used  Vaping Use  . Vaping  Use: Never used  Substance Use Topics  . Alcohol use: No    Alcohol/week: 0.0 standard drinks  . Drug use: No    Home Medications Prior to Admission medications   Medication Sig Start Date End Date Taking? Authorizing Provider  acetaminophen (TYLENOL) 500 MG tablet Take 1 tablet (500 mg total) by mouth every 6 (six) hours as needed for moderate pain. 05/05/20  Yes Elgergawy, Silver Huguenin, MD  atorvastatin (LIPITOR) 20 MG tablet Take 20 mg by mouth daily. 08/24/18  Yes [provider]  clonazePAM (KLONOPIN) 0.25 MG disintegrating tablet Take 1 tablet (0.25 mg total) by mouth 2 (two) times daily. 05/31/20  Yes Angiulli, Lavon Paganini, PA-C  divalproex (DEPAKOTE) 500 MG DR tablet Take 2 tablets (1,000 mg total) by mouth daily. Patient taking differently: Take 500 mg by mouth 2 (two) times daily. 05/31/20  Yes Angiulli, Lavon Paganini, PA-C  docusate sodium (COLACE) 100 MG capsule Take 1 capsule (100 mg total) by mouth 2 (two) times daily. Patient taking differently: Take 100 mg by mouth 2 (two) times daily as needed for mild constipation. 05/05/20  Yes Elgergawy, Silver Huguenin, MD  ertapenem (INVANZ) IVPB Inject 1 g into the vein daily. Indication:  ESBL First Dose: {no Last Day of Therapy:  06/10/20 Labs - Once weekly:  CBC/D and BMP, Labs - Every other week:  ESR and CRP Method of administration: Mini-Bag Plus / Gravity Method of administration may be changed at the discretion of home infusion pharmacist based upon assessment of the patient and/or caregiver's ability to self-administer the medication ordered. 05/31/20  Yes Meredith Staggers, MD  HYDROcodone-acetaminophen (NORCO) 5-325 MG tablet Take 1-2 tablets by mouth every 6 (six) hours as needed for severe pain. 06/07/20  Yes Elnora Morrison, MD  lacosamide (VIMPAT) 200 MG TABS tablet Take 1 tablet (200 mg total) by mouth 2 (two) times daily. 05/31/20  Yes Angiulli, Lavon Paganini, PA-C  levocetirizine (XYZAL) 5 MG tablet Take 5 mg by mouth every evening.   Yes  [provider]  LORazepam (ATIVAN) 0.5 MG tablet Take 0.5 mg by mouth daily. For 5 days, then stop.   Yes [provider]  metoprolol tartrate (LOPRESSOR) 25 MG tablet Take 1 tablet (25 mg total) by mouth 2 (two) times daily. 05/31/20  Yes Angiulli, Lavon Paganini, PA-C  omeprazole (PRILOSEC) 40 MG capsule Take 1 capsule (40 mg total) by mouth daily. 11/03/19  Yes Laurine Blazer B, PA-C  ondansetron (ZOFRAN ODT) 4 MG disintegrating tablet 69m ODT q4 hours prn nausea/vomit 06/07/20  Yes ZElnora Morrison MD  Pediatric Multiple Vit-C-FA (PEDIATRIC MULTIVITAMIN) chewable tablet Chew 1 tablet by mouth daily.   Yes [provider]  benzonatate (TESSALON) 200 MG capsule Take 1 capsule (200 mg total)  by mouth 3 (three) times daily. Patient not taking: Reported on 06/07/2020 05/31/20   Angiulli, Lavon Paganini, PA-C  feeding supplement (ENSURE ENLIVE / ENSURE PLUS) LIQD Take 237 mLs by mouth 3 (three) times daily between meals. Patient not taking: Reported on 06/07/2020 05/05/20   Elgergawy, Silver Huguenin, MD  Fluticasone-Salmeterol (ADVAIR DISKUS) 100-50 MCG/DOSE AEPB Inhale 1 puff into the lungs in the morning and at bedtime. Patient not taking: Reported on 06/07/2020 05/31/20 05/31/21  Angiulli, Lavon Paganini, PA-C  melatonin 3 MG TABS tablet Take 1 tablet (3 mg total) by mouth at bedtime as needed (insomina). Patient not taking: Reported on 06/07/2020 05/31/20   Angiulli, Lavon Paganini, PA-C    Allergies    Codeine and Lamictal [lamotrigine]  Review of Systems   Review of Systems  Constitutional: Negative for chills and fever.  HENT: Negative for congestion.   Eyes: Negative for visual disturbance.  Respiratory: Negative for shortness of breath.   Cardiovascular: Negative for chest pain.  Gastrointestinal: Positive for nausea and vomiting. Negative for abdominal pain.  Genitourinary: Positive for flank pain. Negative for dysuria.  Musculoskeletal: Negative for back pain, neck pain and neck stiffness.   Skin: Negative for rash.  Neurological: Negative for light-headedness and headaches.    Physical Exam Updated Vital Signs BP 136/62   Pulse (!) 59   Temp 98 F (36.7 C) (Oral)   Resp 15   Ht 5' 6"  (1.676 m)   Wt 59 kg   SpO2 100%   BMI 20.98 kg/m   Physical Exam Vitals and nursing note reviewed.  Constitutional:      Appearance: She is well-developed and well-nourished.  HENT:     Head: Normocephalic and atraumatic.  Eyes:     General:        Right eye: No discharge.        Left eye: No discharge.     Conjunctiva/sclera: Conjunctivae normal.  Neck:     Trachea: No tracheal deviation.  Cardiovascular:     Rate and Rhythm: Normal rate and regular rhythm.  Pulmonary:     Effort: Pulmonary effort is normal.     Breath sounds: Normal breath sounds.  Abdominal:     General: There is no distension.     Palpations: Abdomen is soft.     Tenderness: There is abdominal tenderness (L flank). There is no guarding.  Musculoskeletal:        General: No edema.     Cervical back: Normal range of motion and neck supple.  Skin:    General: Skin is warm.     Findings: No rash.  Neurological:     General: No focal deficit present.     Mental Status: She is alert and oriented to person, place, and time.  Psychiatric:        Mood and Affect: Mood and affect normal.     ED Results / Procedures / Treatments   Labs (all labs ordered are listed, but only abnormal results are displayed) Labs Reviewed  COMPREHENSIVE METABOLIC PANEL - Abnormal; Notable for the following components:      Result Value   Glucose, Bld 101 (*)    Albumin 3.4 (*)    All other components within normal limits  CBC - Abnormal; Notable for the following components:   Hemoglobin 11.2 (*)    All other components within normal limits  URINALYSIS, ROUTINE W REFLEX MICROSCOPIC - Abnormal; Notable for the following components:   APPearance HAZY (*)  Leukocytes,Ua SMALL (*)    Non Squamous Epithelial 0-5 (*)     All other components within normal limits  RESP PANEL BY RT-PCR (FLU A&B, COVID) [id number]  URINE CULTURE  LIPASE, BLOOD    EKG None  Radiology CT ABDOMEN PELVIS W CONTRAST  Result Date: 06/07/2020 CLINICAL DATA:  Abdominal abscess/infection suspected. Left flank pain. Recent UTI, question renal abscess EXAM: CT ABDOMEN AND PELVIS WITH CONTRAST TECHNIQUE: Multidetector CT imaging of the abdomen and pelvis was performed using the standard protocol following bolus administration of intravenous contrast. CONTRAST:  31m OMNIPAQUE IOHEXOL 300 MG/ML  SOLN COMPARISON:  05/26/2020 FINDINGS: Lower chest: Subpleural scarring in the right lower lobe. No acute finding. Hepatobiliary: No focal liver abnormality.No evidence of biliary obstruction or stone. Pancreas: Unremarkable. Spleen: Unremarkable. Adrenals/Urinary Tract: Negative adrenals. Left renal atrophy with cortical lobulation. There is vaguely hypoenhancing upper pole cortex, where there is greater volume than the thin lower cortex. Fat stranding at the lower pole is not changed, and evaluation at this level is degraded by motion. Suspect this is fixed scarring when compared with CTs since 04/14/2020. No hydronephrosis or abscess. Normal appearance of the right kidney. No urolithiasis. Unremarkable bladder. Stomach/Bowel:  No obstruction. No visible bowel inflammation. Vascular/Lymphatic: No acute vascular abnormality. Atheromatous calcification of the aorta and iliacs. No mass or adenopathy. Reproductive:Hysterectomy. Other: No ascites or pneumoperitoneum. Musculoskeletal: No acute abnormalities. Mild sclerosis in the anterior superior corner of the T11 body, stable and likely benign. Degenerative facet spurring especially at L5-S1. IMPRESSION: 1. Hypoenhancing cortex in the upper pole left kidney, likely pyelonephritis in this setting. No abscess, calculus, or hydronephrosis. 2. Stable stranding below the lower pole left kidney, residua of a resolved  abscess in November 2021. 3. Motion degraded. Electronically Signed   By: JMonte FantasiaM.D.   On: 06/07/2020 10:57    Procedures Procedures (including critical care time)  Medications Ordered in ED Medications  fentaNYL (SUBLIMAZE) injection 30 mcg (30 mcg Intravenous Given 06/07/20 0924)  sodium chloride 0.9 % bolus 1,000 mL (1,000 mLs Intravenous New Bag/Given 06/07/20 0922)  ondansetron (ZOFRAN) injection 4 mg (4 mg Intravenous Given 06/07/20 0924)  iohexol (OMNIPAQUE) 300 MG/ML solution 80 mL (80 mLs Intravenous Contrast Given 06/07/20 1049)    ED Course  I have reviewed the triage vital signs and the nursing notes.  Pertinent labs & imaging results that were available during my care of the patient were reviewed by me and considered in my medical decision making (see chart for details).    MDM Rules/Calculators/A&P                           Blood work reviewed showing normal white blood cell count, hemoglobin 11.2 mild chronic anemia, normal electrolytes, normal kidney function. Urinalysis reviewed showing small leukocytes.  With flank pain, recent complicated UTI CT scan ordered to look for any signs of abscess or other cause.  CT scan results reviewed Showing signs of pyelonephritis without abscess or other acute abnormalities.  Patient improved in the ER, tolerated oral fluids and food. Discussed with hospitalist, no indication for admission at this time.  Updated patient's brother on plan of care and prescription sent to pharmacy with outpatient follow-up. Patient has remainder of IV antibiotics to take at home.   Final Clinical Impression(s) / ED Diagnoses Final diagnoses:  Pyelonephritis  Vomiting in adult    Rx / DC Orders ED Discharge Orders  Ordered    ondansetron (ZOFRAN ODT) 4 MG disintegrating tablet        06/07/20 1237    HYDROcodone-acetaminophen (NORCO) 5-325 MG tablet  Every 6 hours PRN        06/07/20 1237           Elnora Morrison, MD 06/07/20  1240

## 2020-06-07 NOTE — ED Notes (Signed)
Pt ate half a sandwich, crackers, and a cup of water

## 2020-06-07 NOTE — Discharge Instructions (Signed)
Use Tylenol as needed for pain.  Use Zofran as needed for vomiting. For severe pain take norco or vicodin however realize they have the potential for addiction and it can make you sleepy and has tylenol in it.  No operating machinery while taking. Return for persistent vomiting, uncontrolled pain, persistent fevers or new concerns.  Finish your IV antibiotics as previously prescribed.

## 2020-06-08 LAB — URINE CULTURE: Culture: NO GROWTH

## 2020-06-09 ENCOUNTER — Inpatient Hospital Stay: Payer: Medicare Other | Admitting: Internal Medicine

## 2020-06-09 ENCOUNTER — Other Ambulatory Visit: Payer: Self-pay | Admitting: Urology

## 2020-06-09 DIAGNOSIS — A4151 Sepsis due to Escherichia coli [E. coli]: Secondary | ICD-10-CM | POA: Diagnosis not present

## 2020-06-09 DIAGNOSIS — N12 Tubulo-interstitial nephritis, not specified as acute or chronic: Secondary | ICD-10-CM

## 2020-06-09 DIAGNOSIS — R6520 Severe sepsis without septic shock: Secondary | ICD-10-CM | POA: Diagnosis not present

## 2020-06-09 DIAGNOSIS — G40909 Epilepsy, unspecified, not intractable, without status epilepticus: Secondary | ICD-10-CM | POA: Diagnosis not present

## 2020-06-09 DIAGNOSIS — N151 Renal and perinephric abscess: Secondary | ICD-10-CM | POA: Diagnosis not present

## 2020-06-09 DIAGNOSIS — N10 Acute tubulo-interstitial nephritis: Secondary | ICD-10-CM | POA: Diagnosis not present

## 2020-06-09 DIAGNOSIS — I5032 Chronic diastolic (congestive) heart failure: Secondary | ICD-10-CM | POA: Diagnosis not present

## 2020-06-12 ENCOUNTER — Telehealth: Payer: Self-pay

## 2020-06-12 NOTE — Telephone Encounter (Signed)
Spoke with [facility] at [facility] to let her know that Dr. Juleen China would like to maintain the midline until she is seen by him on 06/14/20. Orders repeated and verified.   Beryle Flock, RN

## 2020-06-12 NOTE — Telephone Encounter (Signed)
Received call from [facility] at [facility] wondering if okay to pull midline. Treatment ended 06/10/20. Will route to provider.   Beryle Flock, RN

## 2020-06-12 NOTE — Telephone Encounter (Signed)
Please wait until appt with me.  Thanks

## 2020-06-13 ENCOUNTER — Other Ambulatory Visit (HOSPITAL_COMMUNITY)
Admission: RE | Admit: 2020-06-13 | Discharge: 2020-06-13 | Disposition: A | Discharge status: Home / Self Care | Payer: Medicare Other | Source: Skilled Nursing Facility | Attending: Internal Medicine | Admitting: Internal Medicine

## 2020-06-13 DIAGNOSIS — N151 Renal and perinephric abscess: Secondary | ICD-10-CM | POA: Diagnosis not present

## 2020-06-13 DIAGNOSIS — G40909 Epilepsy, unspecified, not intractable, without status epilepticus: Secondary | ICD-10-CM | POA: Diagnosis not present

## 2020-06-13 DIAGNOSIS — A419 Sepsis, unspecified organism: Secondary | ICD-10-CM | POA: Insufficient documentation

## 2020-06-13 DIAGNOSIS — A4151 Sepsis due to Escherichia coli [E. coli]: Secondary | ICD-10-CM | POA: Diagnosis not present

## 2020-06-13 DIAGNOSIS — N10 Acute tubulo-interstitial nephritis: Secondary | ICD-10-CM | POA: Diagnosis not present

## 2020-06-13 DIAGNOSIS — R6520 Severe sepsis without septic shock: Secondary | ICD-10-CM | POA: Diagnosis not present

## 2020-06-13 DIAGNOSIS — I5032 Chronic diastolic (congestive) heart failure: Secondary | ICD-10-CM | POA: Diagnosis not present

## 2020-06-13 LAB — CBC WITH DIFFERENTIAL/PLATELET
Abs Immature Granulocytes: 0.03 10*3/uL (ref 0.00–0.07)
Basophils Absolute: 0 10*3/uL (ref 0.0–0.1)
Basophils Relative: 1 %
Eosinophils Absolute: 0.2 10*3/uL (ref 0.0–0.5)
Eosinophils Relative: 3 %
HCT: 32.2 % — ABNORMAL LOW (ref 36.0–46.0)
Hemoglobin: 10 g/dL — ABNORMAL LOW (ref 12.0–15.0)
Immature Granulocytes: 1 %
Lymphocytes Relative: 41 %
Lymphs Abs: 2.6 10*3/uL (ref 0.7–4.0)
MCH: 29.2 pg (ref 26.0–34.0)
MCHC: 31.1 g/dL (ref 30.0–36.0)
MCV: 93.9 fL (ref 80.0–100.0)
Monocytes Absolute: 0.4 10*3/uL (ref 0.1–1.0)
Monocytes Relative: 6 %
Neutro Abs: 3.1 10*3/uL (ref 1.7–7.7)
Neutrophils Relative %: 48 %
Platelets: 203 10*3/uL (ref 150–400)
RBC: 3.43 MIL/uL — ABNORMAL LOW (ref 3.87–5.11)
RDW: 15.6 % — ABNORMAL HIGH (ref 11.5–15.5)
WBC: 6.2 10*3/uL (ref 4.0–10.5)
nRBC: 0 % (ref 0.0–0.2)

## 2020-06-13 LAB — BASIC METABOLIC PANEL
Anion gap: 9 (ref 5–15)
BUN: 8 mg/dL (ref 6–20)
CO2: 25 mmol/L (ref 22–32)
Calcium: 8.7 mg/dL — ABNORMAL LOW (ref 8.9–10.3)
Chloride: 105 mmol/L (ref 98–111)
Creatinine, Ser: 0.8 mg/dL (ref 0.44–1.00)
GFR, Estimated: >60 mL/min (ref >60)
Glucose, Bld: 87 mg/dL (ref 70–99)
Potassium: 3.6 mmol/L (ref 3.5–5.1)
Sodium: 139 mmol/L (ref 135–145)

## 2020-06-13 LAB — C-REACTIVE PROTEIN: CRP: 1.3 mg/dL — ABNORMAL HIGH (ref <1.0)

## 2020-06-13 LAB — SEDIMENTATION RATE: Sed Rate: 18 mm/hr (ref 0–22)

## 2020-06-13 NOTE — Progress Notes (Unsigned)
[facility]  Reason for Consult: Hospital follow-up for ESBL bacteremia, pyelonephritis, renal abscess  Referring Provider: Hospital follow-up   HPI:    Maria [name] is a 60 y.o. female who presents to clinic for hospital follow-up of ESBL bacteremia, pyelonephritis, renal abscess.     Patient presents to clinic today after a recent prolonged hospitalization.  She was admitted on November 12 with sepsis secondary to E. coli urinary tract infection with pyelonephritis.  Her hospital course was complicated by breakthrough seizures with delirium and debility.  She was subsequently admitted to inpatient rehab on December 3.  Approximately 5 days into her rehab stay on December 8 she developed fevers with hypotension secondary to ESBL E. coli bacteremia.  CT abdomen/pelvis at that time revealed a left renal abscess not amenable to drainage.  She was continued on IV antibiotics and repeat CT scan 2 weeks later performed December 24 showed that the abscess had resolved with some residual stranding.  Dr. Gale Journey was reconsulted for input and recommended a 4-week course of antibiotics which ended on June 10, 2020.  Since hospital discharge patient complained of left-sided abdominal pain.  Her mother called the office looking for guidance regarding this.  She reported the patient was having normal bowel movements (chronic constipation was noted during hospitalization).  There were no fevers or chills and there was no clear etiology as to her abdominal pain.  She subsequently presented to the emergency department on January 5 for further evaluation.  At that time a urine culture was completed which was negative.  Urinalysis did not indicate infection either.  CBC with no leukocytosis.  A CT of the abdomen and pelvis was obtained which showed some hypoenhancement of the cortex in the upper pole of the left kidney but no abscess, stone, or hydronephrosis.  There was also stable  stranding below the lower left pole of the left kidney.  She is here today for follow-up.  Patient's Medications  New Prescriptions   No medications on file  Previous Medications   ACETAMINOPHEN (TYLENOL) 500 MG TABLET    Take 1 tablet (500 mg total) by mouth every 6 (six) hours as needed for moderate pain.   ATORVASTATIN (LIPITOR) 20 MG TABLET    Take 20 mg by mouth daily.   BENZONATATE (TESSALON) 200 MG CAPSULE    Take 1 capsule (200 mg total) by mouth 3 (three) times daily.   CLONAZEPAM (KLONOPIN) 0.25 MG DISINTEGRATING TABLET    Take 1 tablet (0.25 mg total) by mouth 2 (two) times daily.   DIVALPROEX (DEPAKOTE) 500 MG DR TABLET    Take 2 tablets (1,000 mg total) by mouth daily.   DOCUSATE SODIUM (COLACE) 100 MG CAPSULE    Take 1 capsule (100 mg total) by mouth 2 (two) times daily.   FEEDING SUPPLEMENT (ENSURE ENLIVE / ENSURE PLUS) LIQD    Take 237 mLs by mouth 3 (three) times daily between meals.   FLUTICASONE-SALMETEROL (ADVAIR DISKUS) 100-50 MCG/DOSE AEPB    Inhale 1 puff into the lungs in the morning and at bedtime.   HYDROCODONE-ACETAMINOPHEN (NORCO) 5-325 MG TABLET    Take 1-2 tablets by mouth every 6 (six) hours as needed for severe pain.   LACOSAMIDE (VIMPAT) 200 MG TABS TABLET    Take 1 tablet (200 mg total) by mouth 2 (two) times daily.   LEVOCETIRIZINE (XYZAL) 5 MG TABLET    Take 5 mg by mouth every evening.   LORAZEPAM (  ATIVAN) 0.5 MG TABLET    Take 0.5 mg by mouth daily. For 5 days, then stop.   MELATONIN 3 MG TABS TABLET    Take 1 tablet (3 mg total) by mouth at bedtime as needed (insomina).   METOPROLOL TARTRATE (LOPRESSOR) 25 MG TABLET    Take 1 tablet (25 mg total) by mouth 2 (two) times daily.   OMEPRAZOLE (PRILOSEC) 40 MG CAPSULE    Take 1 capsule (40 mg total) by mouth daily.   ONDANSETRON (ZOFRAN ODT) 4 MG DISINTEGRATING TABLET    53m ODT q4 hours prn nausea/vomit   OXYBUTYNIN (DITROPAN) 5 MG TABLET    Take 5 mg by mouth at bedtime.   PEDIATRIC MULTIPLE VIT-C-FA  (PEDIATRIC MULTIVITAMIN) CHEWABLE TABLET    Chew 1 tablet by mouth daily.  Modified Medications   No medications on file  Discontinued Medications   ERTAPENEM (INVANZ) IVPB    Inject 1 g into the vein daily. Indication:  ESBL First Dose: {no Last Day of Therapy:  06/10/20 Labs - Once weekly:  CBC/D and BMP, Labs - Every other week:  ESR and CRP Method of administration: Mini-Bag Plus / Gravity Method of administration may be changed at the discretion of home infusion pharmacist based upon assessment of the patient and/or caregiver's ability to self-administer the medication ordered.      Past Medical History:  Diagnosis Date  . Constipation   . Dysphagia   . Fracture    R foot  . GERD (gastroesophageal reflux disease)   . History of shingles 10/2016  . Mental retardation    lesion in head  . Seizures ([place]    "not fully controlled on max doses of meds" (Neuro ofc note 12/2014)  . [place]   . Tremor     Social History   Tobacco Use  . Smoking status: Former Smoker    Packs/day: 1.50    Years: 15.00    Pack years: 22.50    Types: Cigarettes    Quit date: 05/22/2006    Years since quitting: 14.0  . Smokeless tobacco: Never Used  Vaping Use  . Vaping Use: Never used  Substance Use Topics  . Alcohol use: No    Alcohol/week: 0.0 standard drinks  . Drug use: No    Family History  Problem Relation Age of Onset  . High Cholesterol Mother   . High blood pressure Mother   . Diabetes Father     Allergies  Allergen Reactions  . Codeine Nausea And Vomiting  . Lamictal [Lamotrigine] Rash    Review of Systems  Constitutional: Negative for chills and fever.  Respiratory: Negative.   Cardiovascular: Negative.   Gastrointestinal: Positive for abdominal pain (has improved).  Genitourinary: Negative for dysuria and flank pain.      OBJECTIVE:    Vitals:   06/14/20 1512  BP: 122/70  Pulse: 64  Temp: 97.9 F (36.6 C)  TempSrc: Oral  Weight: 132 lb  (59.9 kg)     Body mass index is 21.31 kg/m.  Physical Exam Constitutional:      General: She is not in acute distress. HENT:     Head: Normocephalic and atraumatic.  Abdominal:     General: There is no distension.     Palpations: Abdomen is soft.     Tenderness: There is no abdominal tenderness.  Musculoskeletal:     Comments: Left midline without warmth or erythema.   Neurological:     General: No focal deficit present.  Mental Status: She is alert and oriented to person, place, and time.      Labs and Microbiology:  CBC Latest Ref Rng & Units 06/13/2020 06/06/2020 05/29/2020  WBC 4.0 - 10.5 K/uL 6.2 4.6 6.5  Hemoglobin 12.0 - 15.0 g/dL 10.0(L) 11.2(L) 9.8(L)  Hematocrit 36.0 - 46.0 % 32.2(L) 36.3 30.4(L)  Platelets 150 - 400 K/uL 203 183 239   CMP Latest Ref Rng & Units 06/13/2020 06/06/2020 05/29/2020  Glucose 70 - 99 mg/dL 87 101(H) 97  BUN 6 - 20 mg/dL _0 Creatinine 0.44 - 1.00 mg/dL 0.80 0.82 0.87  Sodium 135 - 145 mmol/L 139 144 141  Potassium 3.5 - 5.1 mmol/L 3.6 4.0 4.1  Chloride 98 - 111 mmol/L 105 108 105  CO2 22 - 32 mmol/L _1 Calcium 8.9 - 10.3 mg/dL 8.7(L) 9.9 9.3  Total Protein 6.5 - 8.1 g/dL - 6.7 -  Total Bilirubin 0.3 - 1.2 mg/dL - 0.7 -  Alkaline Phos 38 - 126 U/L - 95 -  AST 15 - 41 U/L - 32 -  ALT 0 - 44 U/L - 34 -     Recent Results (from the past 240 hour(s))  Resp Panel by RT-PCR (Flu A&B, Covid) Nasopharyngeal Swab     Status: None   Collection Time: 06/07/20  9:02 AM   Specimen: Nasopharyngeal Swab; Nasopharyngeal(NP) swabs in vial transport medium  Result Value Ref Range Status   SARS Coronavirus 2 by RT PCR NEGATIVE NEGATIVE Final    Comment: (NOTE) SARS-CoV-2 target nucleic acids are NOT DETECTED.  The SARS-CoV-2 RNA is generally detectable in upper respiratory specimens during the acute phase of infection. The lowest concentration of SARS-CoV-2 viral copies this assay can detect is 138 copies/mL. A negative result does  not preclude SARS-Cov-2 infection and should not be used as the sole basis for treatment or other patient management decisions. A negative result may occur with  improper specimen collection/handling, submission of specimen other than nasopharyngeal swab, presence of viral mutation(s) within the areas targeted by this assay, and inadequate number of viral copies(<138 copies/mL). A negative result must be combined with clinical observations, patient history, and epidemiological information. The expected result is Negative.  Fact Sheet for Patients:  EntrepreneurPulse.com.au  Fact Sheet for Healthcare Providers:  IncredibleEmployment.be  This test is no t yet approved or cleared by the [place] FDA and  has been authorized for detection and/or diagnosis of SARS-CoV-2 by FDA under an Emergency Use Authorization (EUA). This EUA will remain  in effect (meaning this test can be used) for the duration of the COVID-19 declaration under Section 564(b)(1) of the Act, 21 U.S.C.section 360bbb-3(b)(1), unless the authorization is terminated  or revoked sooner.       Influenza A by PCR NEGATIVE NEGATIVE Final   Influenza B by PCR NEGATIVE NEGATIVE Final    Comment: (NOTE) The Xpert Xpress SARS-CoV-2/FLU/RSV plus assay is intended as an aid in the diagnosis of influenza from Nasopharyngeal swab specimens and should not be used as a sole basis for treatment. Nasal washings and aspirates are unacceptable for Xpert Xpress SARS-CoV-2/FLU/RSV testing.  Fact Sheet for Patients: EntrepreneurPulse.com.au  Fact Sheet for Healthcare Providers: IncredibleEmployment.be  This test is not yet approved or cleared by the [place] FDA and has been authorized for detection and/or diagnosis of SARS-CoV-2 by FDA under an Emergency Use Authorization (EUA). This EUA will remain in effect (meaning this test can be used) for the  duration of  the COVID-19 declaration under Section 564(b)(1) of the Act, 21 U.S.C. section 360bbb-3(b)(1), unless the authorization is terminated or revoked.  Performed at [facility], [place] [address] [place], [place] [address]   Urine culture     Status: None   Collection Time: 06/07/20 11:55 AM   Specimen: Urine, Random  Result Value Ref Range Status   Specimen Description URINE, RANDOM  Final   Special Requests NONE  Final   Culture   Final    NO GROWTH Performed at [facility], [place] [address] [place], [place] [address]    Report Status 06/08/2020 FINAL  Final    Imaging: CT abdomen/pelvis 06/07/20 IMPRESSION: 1. Hypoenhancing cortex in the upper pole left kidney, likely pyelonephritis in this setting. No abscess, calculus, or hydronephrosis. 2. Stable stranding below the lower pole left kidney, residua of a resolved abscess in November 2021. 3. Motion degraded.  ASSESSMENT & PLAN:    1. ESBL (extended spectrum beta-lactamase) producing bacteria infection 2. Pyelonephritis of left kidney 3. Renal abscess, left 4. LLQ abdominal pain 5. Need for COVID Vaccination She has a history of ESBL E. coli bacteremia complicated by renal abscess and pyelonephritis.  She has completed 4 weeks of appropriate antibiotic therapy with resolution of abscess on CT imaging x2.  There does continue to be some vague hypoenhancement of the cortex in the upper pole of the left kidney suggestive of possible pyelonephritis, however, I wonder if this could just be radiographic lag given her repeat labs showed normal CBC, unremarkable UA, and negative cultures.  I am unsure as to the etiology of her abdominal pain that she was having given the report of normal bowel movements by her mother.  Fortunately, this has improved.  At this point, I do not see any further indication for antibiotics since she has received 4 weeks of therapy which should be adequate.  Of note, she does not have any  ureteral stents or stones that would pose a nidus for infection, however, she may benefit from further follow up with urology.  Her most recent cultures indicate no viable oral options although fosfomycin may be an option if chronic suppression in the future is indicated.   I have also reviewed labs from yesterday 06/13/20 showing normal CBC, normal ESR, and barely elevated CRP of 1.3 (previously 4.1).  She received COVID vaccine x 2 last in February 2021 with [place] and is interested in a booster today.   PLAN: . Continue to follow off antibiotics . Okay to pull midline IV . Recommend follow-up with urology . Recommend follow-up with primary care . Follow-up with ID as needed . COVID booster today   Raynelle High[facility] for Infectious Disease [facility] 06/14/2020, 3:39 PM

## 2020-06-14 ENCOUNTER — Telehealth: Payer: Self-pay

## 2020-06-14 ENCOUNTER — Ambulatory Visit (INDEPENDENT_AMBULATORY_CARE_PROVIDER_SITE_OTHER): Payer: Medicare Other | Admitting: Internal Medicine

## 2020-06-14 ENCOUNTER — Encounter: Payer: Self-pay | Admitting: Internal Medicine

## 2020-06-14 ENCOUNTER — Other Ambulatory Visit: Payer: Self-pay

## 2020-06-14 VITALS — BP 122/70 | HR 64 | Temp 97.9°F | Wt 132.0 lb

## 2020-06-14 DIAGNOSIS — R1032 Left lower quadrant pain: Secondary | ICD-10-CM | POA: Diagnosis not present

## 2020-06-14 DIAGNOSIS — N12 Tubulo-interstitial nephritis, not specified as acute or chronic: Secondary | ICD-10-CM | POA: Diagnosis not present

## 2020-06-14 DIAGNOSIS — A499 Bacterial infection, unspecified: Secondary | ICD-10-CM | POA: Diagnosis not present

## 2020-06-14 DIAGNOSIS — Z23 Encounter for immunization: Secondary | ICD-10-CM

## 2020-06-14 DIAGNOSIS — N151 Renal and perinephric abscess: Secondary | ICD-10-CM | POA: Diagnosis not present

## 2020-06-14 DIAGNOSIS — Z1612 Extended spectrum beta lactamase (ESBL) resistance: Secondary | ICD-10-CM

## 2020-06-14 NOTE — Progress Notes (Signed)
   Covid-19 Vaccination Clinic  Name:  Maria [name]    MRN: [id number] DOB: May 02, 1961  06/14/2020  Maria [name] was observed post Covid-19 immunization for 15 minutes without incident. She was provided with Vaccine Information Sheet and instruction to access the V-Safe system.   Maria [name] was instructed to call 911 with any severe reactions post vaccine: Marland Kitchen Difficulty breathing  . Swelling of face and throat  . A fast heartbeat  . A bad rash all over body  . Dizziness and weakness     Maria [name] T Brooks Sailors

## 2020-06-14 NOTE — Patient Instructions (Signed)
Thank you for coming to see me today. It was a pleasure seeing you.  We have completed antibiotics to treat your infection and will ask home health to remove your midline IV.  Please follow-up with [place] as needed.   If you have any questions or concerns, please do not hesitate to call the office at [phone].  Take Care,   Jule Ser, DO

## 2020-06-14 NOTE — Telephone Encounter (Signed)
I spoke with [place] and gave her verbal orders to pull patient's midline. Debbie verbalized understanding. Amonie Wisser T Brooks Sailors

## 2020-06-14 NOTE — Telephone Encounter (Signed)
Received call from [place], home health nurse with [facility]. She states they have been having trouble getting labs on the patient, but that labs were drawn yesterday when she was seen at [facility]. Results available in Epic. Caryl Pina also requesting that we send orders to pull midline after patient is seen.   Beryle Flock, RN

## 2020-06-15 ENCOUNTER — Encounter: Payer: Medicare Other | Admitting: Registered Nurse

## 2020-06-15 DIAGNOSIS — R233 Spontaneous ecchymoses: Secondary | ICD-10-CM | POA: Diagnosis not present

## 2020-06-15 DIAGNOSIS — N10 Acute tubulo-interstitial nephritis: Secondary | ICD-10-CM | POA: Diagnosis not present

## 2020-06-15 DIAGNOSIS — J309 Allergic rhinitis, unspecified: Secondary | ICD-10-CM | POA: Diagnosis not present

## 2020-06-15 DIAGNOSIS — N151 Renal and perinephric abscess: Secondary | ICD-10-CM | POA: Diagnosis not present

## 2020-06-15 DIAGNOSIS — S93402D Sprain of unspecified ligament of left ankle, subsequent encounter: Secondary | ICD-10-CM | POA: Diagnosis not present

## 2020-06-15 DIAGNOSIS — R569 Unspecified convulsions: Secondary | ICD-10-CM | POA: Diagnosis not present

## 2020-06-15 DIAGNOSIS — W19XXXA Unspecified fall, initial encounter: Secondary | ICD-10-CM | POA: Diagnosis not present

## 2020-06-15 DIAGNOSIS — R6520 Severe sepsis without septic shock: Secondary | ICD-10-CM | POA: Diagnosis not present

## 2020-06-15 DIAGNOSIS — R2232 Localized swelling, mass and lump, left upper limb: Secondary | ICD-10-CM | POA: Diagnosis not present

## 2020-06-15 DIAGNOSIS — Z6826 Body mass index (BMI) 26.0-26.9, adult: Secondary | ICD-10-CM | POA: Diagnosis not present

## 2020-06-15 DIAGNOSIS — D696 Thrombocytopenia, unspecified: Secondary | ICD-10-CM | POA: Diagnosis not present

## 2020-06-15 DIAGNOSIS — E860 Dehydration: Secondary | ICD-10-CM | POA: Diagnosis not present

## 2020-06-15 DIAGNOSIS — Z8669 Personal history of other diseases of the nervous system and sense organs: Secondary | ICD-10-CM | POA: Diagnosis not present

## 2020-06-15 DIAGNOSIS — Z86010 Personal history of colonic polyps: Secondary | ICD-10-CM | POA: Diagnosis not present

## 2020-06-15 DIAGNOSIS — I5032 Chronic diastolic (congestive) heart failure: Secondary | ICD-10-CM | POA: Diagnosis not present

## 2020-06-15 DIAGNOSIS — A4151 Sepsis due to Escherichia coli [E. coli]: Secondary | ICD-10-CM | POA: Diagnosis not present

## 2020-06-15 DIAGNOSIS — M8580 Other specified disorders of bone density and structure, unspecified site: Secondary | ICD-10-CM | POA: Diagnosis not present

## 2020-06-15 DIAGNOSIS — G40909 Epilepsy, unspecified, not intractable, without status epilepticus: Secondary | ICD-10-CM | POA: Diagnosis not present

## 2020-06-16 DIAGNOSIS — N10 Acute tubulo-interstitial nephritis: Secondary | ICD-10-CM | POA: Diagnosis not present

## 2020-06-16 DIAGNOSIS — G40909 Epilepsy, unspecified, not intractable, without status epilepticus: Secondary | ICD-10-CM | POA: Diagnosis not present

## 2020-06-16 DIAGNOSIS — I5032 Chronic diastolic (congestive) heart failure: Secondary | ICD-10-CM | POA: Diagnosis not present

## 2020-06-16 DIAGNOSIS — A4151 Sepsis due to Escherichia coli [E. coli]: Secondary | ICD-10-CM | POA: Diagnosis not present

## 2020-06-16 DIAGNOSIS — N151 Renal and perinephric abscess: Secondary | ICD-10-CM | POA: Diagnosis not present

## 2020-06-16 DIAGNOSIS — R6520 Severe sepsis without septic shock: Secondary | ICD-10-CM | POA: Diagnosis not present

## 2020-06-20 DIAGNOSIS — A4151 Sepsis due to Escherichia coli [E. coli]: Secondary | ICD-10-CM | POA: Diagnosis not present

## 2020-06-20 DIAGNOSIS — N151 Renal and perinephric abscess: Secondary | ICD-10-CM | POA: Diagnosis not present

## 2020-06-20 DIAGNOSIS — G40909 Epilepsy, unspecified, not intractable, without status epilepticus: Secondary | ICD-10-CM | POA: Diagnosis not present

## 2020-06-20 DIAGNOSIS — I5032 Chronic diastolic (congestive) heart failure: Secondary | ICD-10-CM | POA: Diagnosis not present

## 2020-06-20 DIAGNOSIS — R6520 Severe sepsis without septic shock: Secondary | ICD-10-CM | POA: Diagnosis not present

## 2020-06-20 DIAGNOSIS — N10 Acute tubulo-interstitial nephritis: Secondary | ICD-10-CM | POA: Diagnosis not present

## 2020-06-21 DIAGNOSIS — I5032 Chronic diastolic (congestive) heart failure: Secondary | ICD-10-CM | POA: Diagnosis not present

## 2020-06-21 DIAGNOSIS — A4151 Sepsis due to Escherichia coli [E. coli]: Secondary | ICD-10-CM | POA: Diagnosis not present

## 2020-06-21 DIAGNOSIS — N10 Acute tubulo-interstitial nephritis: Secondary | ICD-10-CM | POA: Diagnosis not present

## 2020-06-21 DIAGNOSIS — N151 Renal and perinephric abscess: Secondary | ICD-10-CM | POA: Diagnosis not present

## 2020-06-21 DIAGNOSIS — R6520 Severe sepsis without septic shock: Secondary | ICD-10-CM | POA: Diagnosis not present

## 2020-06-21 DIAGNOSIS — G40909 Epilepsy, unspecified, not intractable, without status epilepticus: Secondary | ICD-10-CM | POA: Diagnosis not present

## 2020-06-22 DIAGNOSIS — N151 Renal and perinephric abscess: Secondary | ICD-10-CM | POA: Diagnosis not present

## 2020-06-22 DIAGNOSIS — I5032 Chronic diastolic (congestive) heart failure: Secondary | ICD-10-CM | POA: Diagnosis not present

## 2020-06-22 DIAGNOSIS — A4151 Sepsis due to Escherichia coli [E. coli]: Secondary | ICD-10-CM | POA: Diagnosis not present

## 2020-06-22 DIAGNOSIS — N10 Acute tubulo-interstitial nephritis: Secondary | ICD-10-CM | POA: Diagnosis not present

## 2020-06-22 DIAGNOSIS — G40909 Epilepsy, unspecified, not intractable, without status epilepticus: Secondary | ICD-10-CM | POA: Diagnosis not present

## 2020-06-22 DIAGNOSIS — R6520 Severe sepsis without septic shock: Secondary | ICD-10-CM | POA: Diagnosis not present

## 2020-06-23 ENCOUNTER — Encounter: Payer: Medicare Other | Admitting: Registered Nurse

## 2020-06-23 DIAGNOSIS — A4151 Sepsis due to Escherichia coli [E. coli]: Secondary | ICD-10-CM | POA: Diagnosis not present

## 2020-06-23 DIAGNOSIS — R6520 Severe sepsis without septic shock: Secondary | ICD-10-CM | POA: Diagnosis not present

## 2020-06-23 DIAGNOSIS — N10 Acute tubulo-interstitial nephritis: Secondary | ICD-10-CM | POA: Diagnosis not present

## 2020-06-23 DIAGNOSIS — I5032 Chronic diastolic (congestive) heart failure: Secondary | ICD-10-CM | POA: Diagnosis not present

## 2020-06-23 DIAGNOSIS — N151 Renal and perinephric abscess: Secondary | ICD-10-CM | POA: Diagnosis not present

## 2020-06-23 DIAGNOSIS — G40909 Epilepsy, unspecified, not intractable, without status epilepticus: Secondary | ICD-10-CM | POA: Diagnosis not present

## 2020-06-24 DIAGNOSIS — N3281 Overactive bladder: Secondary | ICD-10-CM | POA: Diagnosis not present

## 2020-06-24 DIAGNOSIS — D696 Thrombocytopenia, unspecified: Secondary | ICD-10-CM | POA: Diagnosis not present

## 2020-06-24 DIAGNOSIS — G40909 Epilepsy, unspecified, not intractable, without status epilepticus: Secondary | ICD-10-CM | POA: Diagnosis not present

## 2020-06-24 DIAGNOSIS — Z86010 Personal history of colonic polyps: Secondary | ICD-10-CM | POA: Diagnosis not present

## 2020-06-24 DIAGNOSIS — E782 Mixed hyperlipidemia: Secondary | ICD-10-CM | POA: Diagnosis not present

## 2020-06-24 DIAGNOSIS — K219 Gastro-esophageal reflux disease without esophagitis: Secondary | ICD-10-CM | POA: Diagnosis not present

## 2020-06-26 ENCOUNTER — Ambulatory Visit (HOSPITAL_COMMUNITY)
Admission: RE | Admit: 2020-06-26 | Discharge: 2020-06-26 | Disposition: A | Discharge status: Home / Self Care | Payer: Medicare Other | Source: Ambulatory Visit | Attending: Urology | Admitting: Urology

## 2020-06-26 ENCOUNTER — Other Ambulatory Visit: Payer: Self-pay

## 2020-06-26 DIAGNOSIS — R6520 Severe sepsis without septic shock: Secondary | ICD-10-CM | POA: Diagnosis not present

## 2020-06-26 DIAGNOSIS — A4151 Sepsis due to Escherichia coli [E. coli]: Secondary | ICD-10-CM | POA: Diagnosis not present

## 2020-06-26 DIAGNOSIS — N12 Tubulo-interstitial nephritis, not specified as acute or chronic: Secondary | ICD-10-CM | POA: Diagnosis not present

## 2020-06-26 DIAGNOSIS — I5032 Chronic diastolic (congestive) heart failure: Secondary | ICD-10-CM | POA: Diagnosis not present

## 2020-06-26 DIAGNOSIS — G40909 Epilepsy, unspecified, not intractable, without status epilepticus: Secondary | ICD-10-CM | POA: Diagnosis not present

## 2020-06-26 DIAGNOSIS — N151 Renal and perinephric abscess: Secondary | ICD-10-CM | POA: Diagnosis not present

## 2020-06-26 DIAGNOSIS — N10 Acute tubulo-interstitial nephritis: Secondary | ICD-10-CM | POA: Diagnosis not present

## 2020-06-27 ENCOUNTER — Ambulatory Visit: Payer: Medicare Other | Admitting: Urology

## 2020-06-28 DIAGNOSIS — N10 Acute tubulo-interstitial nephritis: Secondary | ICD-10-CM | POA: Diagnosis not present

## 2020-06-28 DIAGNOSIS — N151 Renal and perinephric abscess: Secondary | ICD-10-CM | POA: Diagnosis not present

## 2020-06-28 DIAGNOSIS — G40909 Epilepsy, unspecified, not intractable, without status epilepticus: Secondary | ICD-10-CM | POA: Diagnosis not present

## 2020-06-28 DIAGNOSIS — R6520 Severe sepsis without septic shock: Secondary | ICD-10-CM | POA: Diagnosis not present

## 2020-06-28 DIAGNOSIS — I5032 Chronic diastolic (congestive) heart failure: Secondary | ICD-10-CM | POA: Diagnosis not present

## 2020-06-28 DIAGNOSIS — A4151 Sepsis due to Escherichia coli [E. coli]: Secondary | ICD-10-CM | POA: Diagnosis not present

## 2020-06-29 ENCOUNTER — Other Ambulatory Visit: Payer: Self-pay

## 2020-06-29 ENCOUNTER — Encounter: Payer: Self-pay | Admitting: Registered Nurse

## 2020-06-29 ENCOUNTER — Encounter: Payer: Medicare Other | Attending: Registered Nurse | Admitting: Registered Nurse

## 2020-06-29 VITALS — BP 147/69 | HR 67 | Temp 98.7°F | Ht 66.0 in | Wt 134.0 lb

## 2020-06-29 DIAGNOSIS — R5381 Other malaise: Secondary | ICD-10-CM | POA: Insufficient documentation

## 2020-06-29 DIAGNOSIS — Z1612 Extended spectrum beta lactamase (ESBL) resistance: Secondary | ICD-10-CM | POA: Insufficient documentation

## 2020-06-29 DIAGNOSIS — G40909 Epilepsy, unspecified, not intractable, without status epilepticus: Secondary | ICD-10-CM | POA: Insufficient documentation

## 2020-06-29 DIAGNOSIS — N10 Acute tubulo-interstitial nephritis: Secondary | ICD-10-CM | POA: Diagnosis not present

## 2020-06-29 DIAGNOSIS — A4151 Sepsis due to Escherichia coli [E. coli]: Secondary | ICD-10-CM | POA: Diagnosis not present

## 2020-06-29 DIAGNOSIS — A499 Bacterial infection, unspecified: Secondary | ICD-10-CM | POA: Diagnosis not present

## 2020-06-29 DIAGNOSIS — N151 Renal and perinephric abscess: Secondary | ICD-10-CM | POA: Diagnosis not present

## 2020-06-29 DIAGNOSIS — R6520 Severe sepsis without septic shock: Secondary | ICD-10-CM | POA: Diagnosis not present

## 2020-06-29 DIAGNOSIS — I5032 Chronic diastolic (congestive) heart failure: Secondary | ICD-10-CM | POA: Diagnosis not present

## 2020-06-29 NOTE — Progress Notes (Signed)
Inpatient Rehabilitation Care Coordinator Discharge Note  The overall goal for the admission was met for:   Discharge location: Yes. D/c to home with 24/7 care from mother and support from sister.  Length of Stay: Yes. 13 days.   Discharge activity level: Yes. Supervision.   Home/community participation: Yes. Limited.   Services provided included: MD, RD, PT, OT, SLP, RN, CM, TR, Pharmacy, Neuropsych and SW  Financial Services: Medicare and Private Insurance: Tricare for Life  Choices offered to/list presented XJ:JYPGATAO.gov list  Follow-up services arranged: Home Health: [place] for HHPT/OT/SN (IV abx) and DME: [facility] for TTB and 3in1BSC  Comments (or additional information): contact pt mother Romie Minus [phone] or sister [name] [phone]  Patient/Family verbalized understanding of follow-up arrangements: Yes  Individual responsible for coordination of the follow-up plan: Pt to have assistance with coordinating care needs.   Confirmed correct DME delivered: Rana Snare 06/29/2020    Rana Snare

## 2020-06-29 NOTE — Progress Notes (Signed)
Subjective:    Patient ID: Maria [name], female    DOB: 21-Nov-1960, 60 y.o.   MRN: [id number]  HPI: Maria [name] is a 60 y.o. female who is here for Hospital follow up appointment of her Debility, ESB  ( extended spectrum beta-lactamase) producing bacteria infection and Seizure disorder. Ms. [name] presented to [facility] emergency room on 04/14/2020 via EMS with chief complaint of left lower quadrant pain x 3 weeks.  CT Abdomen Pelvis W Contrast:  IMPRESSION: Findings suspicious for pyelonephritis and small abscess in lower pole of left kidney. Suggest correlation with urinalysis, and recommend continued follow-up by CT to confirm resolution.  No evidence of ureteral calculi or hydronephrosis.  Aortic Atherosclerosis (ICD10-I70.0). US Renal:  IMPRESSION: 1. 2.5 cm structure along the lower pole of the left kidney suspicious for a small abscess given the ultrasound and CT appearance. 2. Unremarkable appearance of the right kidney.  Her hospitalization course was significant for breakthrough seizures with delirium and debility/   [name] had a prolonged hospitalization stay, see discharge summaries for details.  See Dr. Waldron Labs, Maria Chew Pa-C,  And Dr Mel Almond, .  She was admitted to inpatient rehabilitation on 05/05/2020 and discharged to acute care for sepsis secondary to UTI and Pyelonephritis of left kidney on 05/12/2020.   [name] was admitted to inpatient rehabilitation on 05/18/2020 and discharged home on 06/01/2020. [facility]. She denies any pain. She rated her pain 0. Also reports, she has a good appetite.   [name] mother in room, all questions answered.     Pain Inventory Average Pain no pain Pain Right Now 0 My pain is no pain  In the last 24 hours, has pain interfered with the following? General activity no pain Relation with others no pain Enjoyment of life no pain What TIME of day is  your pain at its worst? No pain  Sleep (in general) Fair  Pain is worse with: no pain Pain improves with: no pain Relief from Meds: no pain  Family History  Problem Relation Age of Onset  . High Cholesterol Mother   . High blood pressure Mother   . Diabetes Father    Social History   Socioeconomic History  . Marital status: Single    Spouse name: Not on file  . Number of children: 0  . Years of education: 10  . Highest education level: Not on file  Occupational History    Employer: UNEMPLOYED  Tobacco Use  . Smoking status: Former Smoker    Packs/day: 1.50    Years: 15.00    Pack years: 22.50    Types: Cigarettes    Quit date: 05/22/2006    Years since quitting: 14.1  . Smokeless tobacco: Never Used  Vaping Use  . Vaping Use: Never used  Substance and Sexual Activity  . Alcohol use: No    Alcohol/week: 0.0 standard drinks  . Drug use: No  . Sexual activity: Never    Birth control/protection: Other-see comments    Comment: Hysterectomy  Other Topics Concern  . Not on file  Social History Narrative   Patient is single and lives with her mother Romie Minus)      Patient drinks 3 cups of caffeine daily.   Patient is right handed.         Social Determinants of Health   Financial Resource Strain: Not on file  Food Insecurity: Not on file  Transportation Needs: Not  on file  Physical Activity: Not on file  Stress: Not on file  Social Connections: Not on file   Past Surgical History:  Procedure Laterality Date  . BIOPSY  09/16/2014   Procedure: BIOPSY;  Surgeon: Maria Houston, MD;  Location: [facility];  Service: Endoscopy;;  . CATARACT EXTRACTION     both eyes, May of 2015  . COLONOSCOPY    . COLONOSCOPY WITH PROPOFOL N/A 11/20/2018   Procedure: COLONOSCOPY WITH PROPOFOL;  Surgeon: Maria Houston, MD;  Location: [facility];  Service: Endoscopy;  Laterality: N/A;  . ESOPHAGEAL DILATION N/A 09/16/2014   Procedure: ESOPHAGEAL DILATION WITH 54FR MALONEY DILATOR;   Surgeon: Maria Houston, MD;  Location: [facility];  Service: Endoscopy;  Laterality: N/A;  . ESOPHAGOGASTRODUODENOSCOPY (EGD) WITH PROPOFOL N/A 09/16/2014   Procedure: ESOPHAGOGASTRODUODENOSCOPY (EGD) WITH PROPOFOL;  Surgeon: Maria Houston, MD;  Location: [facility];  Service: Endoscopy;  Laterality: N/A;  . MOUTH SURGERY    . POLYPECTOMY  11/20/2018   Procedure: POLYPECTOMY;  Surgeon: Maria Houston, MD;  [facility];  Service: Endoscopy;;  colon  . Skin graft to gum Right 08/2013  . TONSILLECTOMY AND ADENOIDECTOMY    . TOTAL ABDOMINAL HYSTERECTOMY     Past Surgical History:  Procedure Laterality Date  . BIOPSY  09/16/2014   Procedure: BIOPSY;  Surgeon: Maria Houston, MD;  Location: [facility];  Service: Endoscopy;;  . CATARACT EXTRACTION     both eyes, May of 2015  . COLONOSCOPY    . COLONOSCOPY WITH PROPOFOL N/A 11/20/2018   Procedure: COLONOSCOPY WITH PROPOFOL;  Surgeon: Maria Houston, MD;  [facility];  Service: Endoscopy;  Laterality: N/A;  . ESOPHAGEAL DILATION N/A 09/16/2014   Procedure: ESOPHAGEAL DILATION WITH 54FR MALONEY DILATOR;  Surgeon: Maria Houston, MD;  Location: [facility];  Service: Endoscopy;  Laterality: N/A;  . ESOPHAGOGASTRODUODENOSCOPY (EGD) WITH PROPOFOL N/A 09/16/2014   Procedure: ESOPHAGOGASTRODUODENOSCOPY (EGD) WITH PROPOFOL;  Surgeon: Maria Houston, MD;  Location: [facility];  Service: Endoscopy;  Laterality: N/A;  . MOUTH SURGERY    . POLYPECTOMY  11/20/2018   Procedure: POLYPECTOMY;  Surgeon: Maria Houston, MD;  [facility];  Service: Endoscopy;;  colon  . Skin graft to gum Right 08/2013  . TONSILLECTOMY AND ADENOIDECTOMY    . TOTAL ABDOMINAL HYSTERECTOMY     Past Medical History:  Diagnosis Date  . Constipation   . Dysphagia   . Fracture    R foot  . GERD (gastroesophageal reflux disease)   . History of shingles 10/2016  . Mental retardation    lesion in head  . Seizures ([place])    "not fully controlled on max doses of  meds" (Neuro ofc note 12/2014)  . Thrombocytopenia ([place])   . Tremor    BP (!) 147/69   Pulse 67   Temp 98.7 F (37.1 C)   Ht 5\' 6"  (1.676 m)   Wt 134 lb (60.8 kg)   SpO2 98%   BMI 21.63 kg/m   Opioid Risk Score:   Fall Risk Score:  `1  Depression screen PHQ 2/9  No flowsheet data found.  Review of Systems  Constitutional: Negative.   HENT: Negative.   Eyes: Negative.   Respiratory: Negative.   Cardiovascular: Negative.   Gastrointestinal: Negative.   Endocrine: Negative.   Genitourinary: Negative.   Musculoskeletal: Negative.   Allergic/Immunologic: Negative.   Neurological: Negative.   Hematological: Negative.   Psychiatric/Behavioral: Negative.  All other systems reviewed and are negative.      Objective:   Physical Exam Vitals and nursing note reviewed.  Constitutional:      Appearance: Normal appearance.  Cardiovascular:     Rate and Rhythm: Normal rate and regular rhythm.     Pulses: Normal pulses.     Heart sounds: Normal heart sounds.  Pulmonary:     Effort: Pulmonary effort is normal.     Breath sounds: Normal breath sounds.  Musculoskeletal:     Cervical back: Normal range of motion and neck supple.     Comments: Normal Muscle Bulk and Muscle Testing Reveals:  Upper Extremities: Full ROM and Muscle Strength 5/5  Lower Extremities: Full ROM and Muscle Strength 5/5 Arises from Table slowly Narrow Based  Gait   Skin:    General: Skin is warm and dry.  Neurological:     Mental Status: She is alert and oriented to person, place, and time.  Psychiatric:        Mood and Affect: Mood normal.        Behavior: Behavior normal.           Assessment & Plan:   1.Debility: [place] with [facility]. ,2.ESB  ( extended spectrum beta-lactamase) producing bacteria infection: IV Antibiotics completed. ID Following.  She has an appointment with Urology. Continue to monitor.  3. Seizure disorder.Continue current medication  regimen. PCP following. Continue to monitor.   F/U with Dr Ranell Patrick in 4- 6 weeks

## 2020-07-02 DIAGNOSIS — R1310 Dysphagia, unspecified: Secondary | ICD-10-CM | POA: Diagnosis not present

## 2020-07-02 DIAGNOSIS — Z1612 Extended spectrum beta lactamase (ESBL) resistance: Secondary | ICD-10-CM | POA: Diagnosis not present

## 2020-07-02 DIAGNOSIS — D696 Thrombocytopenia, unspecified: Secondary | ICD-10-CM | POA: Diagnosis not present

## 2020-07-02 DIAGNOSIS — Z452 Encounter for adjustment and management of vascular access device: Secondary | ICD-10-CM | POA: Diagnosis not present

## 2020-07-02 DIAGNOSIS — R6520 Severe sepsis without septic shock: Secondary | ICD-10-CM | POA: Diagnosis not present

## 2020-07-02 DIAGNOSIS — A4151 Sepsis due to Escherichia coli [E. coli]: Secondary | ICD-10-CM | POA: Diagnosis not present

## 2020-07-02 DIAGNOSIS — N151 Renal and perinephric abscess: Secondary | ICD-10-CM | POA: Diagnosis not present

## 2020-07-02 DIAGNOSIS — D649 Anemia, unspecified: Secondary | ICD-10-CM | POA: Diagnosis not present

## 2020-07-02 DIAGNOSIS — I5032 Chronic diastolic (congestive) heart failure: Secondary | ICD-10-CM | POA: Diagnosis not present

## 2020-07-02 DIAGNOSIS — F79 Unspecified intellectual disabilities: Secondary | ICD-10-CM | POA: Diagnosis not present

## 2020-07-02 DIAGNOSIS — Z5181 Encounter for therapeutic drug level monitoring: Secondary | ICD-10-CM | POA: Diagnosis not present

## 2020-07-02 DIAGNOSIS — Z7951 Long term (current) use of inhaled steroids: Secondary | ICD-10-CM | POA: Diagnosis not present

## 2020-07-02 DIAGNOSIS — N10 Acute tubulo-interstitial nephritis: Secondary | ICD-10-CM | POA: Diagnosis not present

## 2020-07-02 DIAGNOSIS — G40909 Epilepsy, unspecified, not intractable, without status epilepticus: Secondary | ICD-10-CM | POA: Diagnosis not present

## 2020-07-02 DIAGNOSIS — Z9181 History of falling: Secondary | ICD-10-CM | POA: Diagnosis not present

## 2020-07-02 DIAGNOSIS — K5909 Other constipation: Secondary | ICD-10-CM | POA: Diagnosis not present

## 2020-07-02 DIAGNOSIS — F419 Anxiety disorder, unspecified: Secondary | ICD-10-CM | POA: Diagnosis not present

## 2020-07-02 DIAGNOSIS — K219 Gastro-esophageal reflux disease without esophagitis: Secondary | ICD-10-CM | POA: Diagnosis not present

## 2020-07-02 DIAGNOSIS — Z792 Long term (current) use of antibiotics: Secondary | ICD-10-CM | POA: Diagnosis not present

## 2020-07-04 ENCOUNTER — Ambulatory Visit: Payer: Medicare Other | Admitting: Urology

## 2020-07-04 DIAGNOSIS — N151 Renal and perinephric abscess: Secondary | ICD-10-CM | POA: Diagnosis not present

## 2020-07-04 DIAGNOSIS — I5032 Chronic diastolic (congestive) heart failure: Secondary | ICD-10-CM | POA: Diagnosis not present

## 2020-07-04 DIAGNOSIS — G40909 Epilepsy, unspecified, not intractable, without status epilepticus: Secondary | ICD-10-CM | POA: Diagnosis not present

## 2020-07-04 DIAGNOSIS — A4151 Sepsis due to Escherichia coli [E. coli]: Secondary | ICD-10-CM | POA: Diagnosis not present

## 2020-07-04 DIAGNOSIS — N10 Acute tubulo-interstitial nephritis: Secondary | ICD-10-CM | POA: Diagnosis not present

## 2020-07-04 DIAGNOSIS — R6520 Severe sepsis without septic shock: Secondary | ICD-10-CM | POA: Diagnosis not present

## 2020-07-06 DIAGNOSIS — I5032 Chronic diastolic (congestive) heart failure: Secondary | ICD-10-CM | POA: Diagnosis not present

## 2020-07-06 DIAGNOSIS — G40909 Epilepsy, unspecified, not intractable, without status epilepticus: Secondary | ICD-10-CM | POA: Diagnosis not present

## 2020-07-06 DIAGNOSIS — N151 Renal and perinephric abscess: Secondary | ICD-10-CM | POA: Diagnosis not present

## 2020-07-06 DIAGNOSIS — N10 Acute tubulo-interstitial nephritis: Secondary | ICD-10-CM | POA: Diagnosis not present

## 2020-07-06 DIAGNOSIS — R6520 Severe sepsis without septic shock: Secondary | ICD-10-CM | POA: Diagnosis not present

## 2020-07-06 DIAGNOSIS — A4151 Sepsis due to Escherichia coli [E. coli]: Secondary | ICD-10-CM | POA: Diagnosis not present

## 2020-07-10 DIAGNOSIS — N10 Acute tubulo-interstitial nephritis: Secondary | ICD-10-CM | POA: Diagnosis not present

## 2020-07-10 DIAGNOSIS — G40909 Epilepsy, unspecified, not intractable, without status epilepticus: Secondary | ICD-10-CM | POA: Diagnosis not present

## 2020-07-10 DIAGNOSIS — I5032 Chronic diastolic (congestive) heart failure: Secondary | ICD-10-CM | POA: Diagnosis not present

## 2020-07-10 DIAGNOSIS — R6520 Severe sepsis without septic shock: Secondary | ICD-10-CM | POA: Diagnosis not present

## 2020-07-10 DIAGNOSIS — A4151 Sepsis due to Escherichia coli [E. coli]: Secondary | ICD-10-CM | POA: Diagnosis not present

## 2020-07-10 DIAGNOSIS — N151 Renal and perinephric abscess: Secondary | ICD-10-CM | POA: Diagnosis not present

## 2020-07-12 DIAGNOSIS — A4151 Sepsis due to Escherichia coli [E. coli]: Secondary | ICD-10-CM | POA: Diagnosis not present

## 2020-07-12 DIAGNOSIS — N151 Renal and perinephric abscess: Secondary | ICD-10-CM | POA: Diagnosis not present

## 2020-07-12 DIAGNOSIS — N10 Acute tubulo-interstitial nephritis: Secondary | ICD-10-CM | POA: Diagnosis not present

## 2020-07-12 DIAGNOSIS — G40909 Epilepsy, unspecified, not intractable, without status epilepticus: Secondary | ICD-10-CM | POA: Diagnosis not present

## 2020-07-12 DIAGNOSIS — R6520 Severe sepsis without septic shock: Secondary | ICD-10-CM | POA: Diagnosis not present

## 2020-07-12 DIAGNOSIS — I5032 Chronic diastolic (congestive) heart failure: Secondary | ICD-10-CM | POA: Diagnosis not present

## 2020-07-14 ENCOUNTER — Ambulatory Visit (HOSPITAL_COMMUNITY)
Admission: EM | Admit: 2020-07-14 | Discharge: 2020-07-14 | Disposition: A | Discharge status: Home / Self Care | Payer: Medicare Other | Attending: Emergency Medicine | Admitting: Emergency Medicine

## 2020-07-14 ENCOUNTER — Emergency Department (HOSPITAL_COMMUNITY): Payer: Medicare Other | Admitting: Anesthesiology

## 2020-07-14 ENCOUNTER — Emergency Department (HOSPITAL_COMMUNITY): Payer: Medicare Other

## 2020-07-14 ENCOUNTER — Other Ambulatory Visit: Payer: Self-pay

## 2020-07-14 ENCOUNTER — Encounter (HOSPITAL_COMMUNITY)
Admission: EM | Disposition: A | Discharge status: Home / Self Care | Payer: Self-pay | Source: Home / Self Care | Attending: Emergency Medicine

## 2020-07-14 ENCOUNTER — Encounter (HOSPITAL_COMMUNITY): Payer: Self-pay

## 2020-07-14 DIAGNOSIS — I700 Atherosclerosis of aorta: Secondary | ICD-10-CM | POA: Diagnosis not present

## 2020-07-14 DIAGNOSIS — Z7951 Long term (current) use of inhaled steroids: Secondary | ICD-10-CM | POA: Diagnosis not present

## 2020-07-14 DIAGNOSIS — K6389 Other specified diseases of intestine: Secondary | ICD-10-CM | POA: Diagnosis not present

## 2020-07-14 DIAGNOSIS — R569 Unspecified convulsions: Secondary | ICD-10-CM | POA: Diagnosis not present

## 2020-07-14 DIAGNOSIS — Z87891 Personal history of nicotine dependence: Secondary | ICD-10-CM | POA: Insufficient documentation

## 2020-07-14 DIAGNOSIS — F79 Unspecified intellectual disabilities: Secondary | ICD-10-CM | POA: Diagnosis not present

## 2020-07-14 DIAGNOSIS — K7689 Other specified diseases of liver: Secondary | ICD-10-CM | POA: Diagnosis not present

## 2020-07-14 DIAGNOSIS — K3580 Unspecified acute appendicitis: Secondary | ICD-10-CM | POA: Diagnosis not present

## 2020-07-14 DIAGNOSIS — Z885 Allergy status to narcotic agent status: Secondary | ICD-10-CM | POA: Diagnosis not present

## 2020-07-14 DIAGNOSIS — Z79899 Other long term (current) drug therapy: Secondary | ICD-10-CM | POA: Diagnosis not present

## 2020-07-14 DIAGNOSIS — R9431 Abnormal electrocardiogram [ECG] [EKG]: Secondary | ICD-10-CM | POA: Diagnosis not present

## 2020-07-14 DIAGNOSIS — K353 Acute appendicitis with localized peritonitis, without perforation or gangrene: Secondary | ICD-10-CM | POA: Diagnosis not present

## 2020-07-14 DIAGNOSIS — D696 Thrombocytopenia, unspecified: Secondary | ICD-10-CM | POA: Diagnosis not present

## 2020-07-14 DIAGNOSIS — K36 Other appendicitis: Secondary | ICD-10-CM | POA: Diagnosis not present

## 2020-07-14 DIAGNOSIS — D649 Anemia, unspecified: Secondary | ICD-10-CM | POA: Diagnosis not present

## 2020-07-14 DIAGNOSIS — R1032 Left lower quadrant pain: Secondary | ICD-10-CM | POA: Diagnosis present

## 2020-07-14 DIAGNOSIS — R1084 Generalized abdominal pain: Secondary | ICD-10-CM | POA: Diagnosis not present

## 2020-07-14 DIAGNOSIS — Z888 Allergy status to other drugs, medicaments and biological substances status: Secondary | ICD-10-CM | POA: Diagnosis not present

## 2020-07-14 DIAGNOSIS — Z20822 Contact with and (suspected) exposure to covid-19: Secondary | ICD-10-CM | POA: Diagnosis not present

## 2020-07-14 DIAGNOSIS — N261 Atrophy of kidney (terminal): Secondary | ICD-10-CM | POA: Diagnosis not present

## 2020-07-14 HISTORY — PX: LAPAROSCOPIC APPENDECTOMY: SHX408

## 2020-07-14 LAB — LIPASE, BLOOD: Lipase: 26 U/L (ref 11–51)

## 2020-07-14 LAB — CBC WITH DIFFERENTIAL/PLATELET
Abs Immature Granulocytes: 0.08 10*3/uL — ABNORMAL HIGH (ref 0.00–0.07)
Basophils Absolute: 0 10*3/uL (ref 0.0–0.1)
Basophils Relative: 0 %
Eosinophils Absolute: 0.1 10*3/uL (ref 0.0–0.5)
Eosinophils Relative: 1 %
HCT: 37.2 % (ref 36.0–46.0)
Hemoglobin: 11.5 g/dL — ABNORMAL LOW (ref 12.0–15.0)
Immature Granulocytes: 1 %
Lymphocytes Relative: 14 %
Lymphs Abs: 2.4 10*3/uL (ref 0.7–4.0)
MCH: 28.8 pg (ref 26.0–34.0)
MCHC: 30.9 g/dL (ref 30.0–36.0)
MCV: 93 fL (ref 80.0–100.0)
Monocytes Absolute: 1.6 10*3/uL — ABNORMAL HIGH (ref 0.1–1.0)
Monocytes Relative: 10 %
Neutro Abs: 12.7 10*3/uL — ABNORMAL HIGH (ref 1.7–7.7)
Neutrophils Relative %: 74 %
Platelets: 130 10*3/uL — ABNORMAL LOW (ref 150–400)
RBC: 4 MIL/uL (ref 3.87–5.11)
RDW: 15.5 % (ref 11.5–15.5)
WBC: 16.9 10*3/uL — ABNORMAL HIGH (ref 4.0–10.5)
nRBC: 0 % (ref 0.0–0.2)

## 2020-07-14 LAB — COMPREHENSIVE METABOLIC PANEL
ALT: 11 U/L (ref 0–44)
AST: 17 U/L (ref 15–41)
Albumin: 3.9 g/dL (ref 3.5–5.0)
Alkaline Phosphatase: 60 U/L (ref 38–126)
Anion gap: 10 (ref 5–15)
BUN: 23 mg/dL — ABNORMAL HIGH (ref 6–20)
CO2: 27 mmol/L (ref 22–32)
Calcium: 9.4 mg/dL (ref 8.9–10.3)
Chloride: 102 mmol/L (ref 98–111)
Creatinine, Ser: 0.89 mg/dL (ref 0.44–1.00)
GFR, Estimated: >60 mL/min (ref >60)
Glucose, Bld: 122 mg/dL — ABNORMAL HIGH (ref 70–99)
Potassium: 4 mmol/L (ref 3.5–5.1)
Sodium: 139 mmol/L (ref 135–145)
Total Bilirubin: 0.6 mg/dL (ref 0.3–1.2)
Total Protein: 6.9 g/dL (ref 6.5–8.1)

## 2020-07-14 LAB — RESP PANEL BY RT-PCR (FLU A&B, COVID) ARPGX2
Influenza A by PCR: NEGATIVE
Influenza B by PCR: NEGATIVE
SARS Coronavirus 2 by RT PCR: NEGATIVE

## 2020-07-14 LAB — URINALYSIS, ROUTINE W REFLEX MICROSCOPIC
Bilirubin Urine: NEGATIVE
Glucose, UA: NEGATIVE mg/dL
Hgb urine dipstick: NEGATIVE
Ketones, ur: 5 mg/dL — AB
Leukocytes,Ua: NEGATIVE
Nitrite: NEGATIVE
Protein, ur: NEGATIVE mg/dL
Specific Gravity, Urine: 1.041 — ABNORMAL HIGH (ref 1.005–1.030)
pH: 7 (ref 5.0–8.0)

## 2020-07-14 SURGERY — APPENDECTOMY, LAPAROSCOPIC
Anesthesia: General | Site: Abdomen

## 2020-07-14 MED ORDER — ONDANSETRON HCL 4 MG/2ML IJ SOLN
4.0000 mg | Freq: Once | INTRAMUSCULAR | Status: AC
  Administered 2020-07-14: 4 mg via INTRAVENOUS
  Filled 2020-07-14: qty 2

## 2020-07-14 MED ORDER — ONDANSETRON HCL 4 MG PO TABS: 4.0000 mg | ORAL_TABLET | Freq: Three times a day (TID) | ORAL | 1 refills | Status: AC | PRN

## 2020-07-14 MED ORDER — CHLORHEXIDINE GLUCONATE 0.12 % MT SOLN
15.0000 mL | Freq: Once | OROMUCOSAL | Status: AC
  Filled 2020-07-14: qty 15

## 2020-07-14 MED ORDER — PHENYLEPHRINE HCL (PRESSORS) 10 MG/ML IV SOLN
INTRAVENOUS | Status: DC | PRN
  Administered 2020-07-14 (×3): 80 ug via INTRAVENOUS

## 2020-07-14 MED ORDER — IOHEXOL 300 MG/ML  SOLN
100.0000 mL | Freq: Once | INTRAMUSCULAR | Status: AC | PRN
  Administered 2020-07-14: 100 mL via INTRAVENOUS

## 2020-07-14 MED ORDER — METRONIDAZOLE IN NACL 5-0.79 MG/ML-% IV SOLN
500.0000 mg | Freq: Once | INTRAVENOUS | Status: AC
  Administered 2020-07-14: 500 mg via INTRAVENOUS
  Filled 2020-07-14: qty 100

## 2020-07-14 MED ORDER — SODIUM CHLORIDE 0.9 % IR SOLN
Status: DC | PRN
  Administered 2020-07-14: 1000 mL

## 2020-07-14 MED ORDER — MORPHINE SULFATE (PF) 4 MG/ML IV SOLN
4.0000 mg | Freq: Once | INTRAVENOUS | Status: AC
  Administered 2020-07-14: 4 mg via INTRAVENOUS
  Filled 2020-07-14: qty 1

## 2020-07-14 MED ORDER — LACTATED RINGERS IV SOLN
INTRAVENOUS | Status: DC
  Administered 2020-07-14: 1000 mL via INTRAVENOUS

## 2020-07-14 MED ORDER — LACTATED RINGERS IV BOLUS
1000.0000 mL | Freq: Once | INTRAVENOUS | Status: AC
  Administered 2020-07-14: 1000 mL via INTRAVENOUS

## 2020-07-14 MED ORDER — PHENYLEPHRINE 40 MCG/ML (10ML) SYRINGE FOR IV PUSH (FOR BLOOD PRESSURE SUPPORT)
PREFILLED_SYRINGE | INTRAVENOUS | Status: AC
  Filled 2020-07-14: qty 10

## 2020-07-14 MED ORDER — SODIUM CHLORIDE 0.9 % IV SOLN
2.0000 g | INTRAVENOUS | Status: AC
  Administered 2020-07-14: 2 g via INTRAVENOUS
  Filled 2020-07-14 (×2): qty 2

## 2020-07-14 MED ORDER — BUPIVACAINE LIPOSOME 1.3 % IJ SUSP
INTRAMUSCULAR | Status: AC
  Filled 2020-07-14: qty 20

## 2020-07-14 MED ORDER — BUPIVACAINE LIPOSOME 1.3 % IJ SUSP
INTRAMUSCULAR | Status: DC | PRN
  Administered 2020-07-14: 20 mL

## 2020-07-14 MED ORDER — ONDANSETRON HCL 4 MG/2ML IJ SOLN: 4.0000 mg | Freq: Once | INTRAMUSCULAR | Status: DC | PRN

## 2020-07-14 MED ORDER — ORAL CARE MOUTH RINSE
15.0000 mL | Freq: Once | OROMUCOSAL | Status: AC
  Administered 2020-07-14: 15 mL via OROMUCOSAL

## 2020-07-14 MED ORDER — MIDAZOLAM HCL 2 MG/2ML IJ SOLN
INTRAMUSCULAR | Status: AC
  Filled 2020-07-14: qty 2

## 2020-07-14 MED ORDER — SODIUM CHLORIDE 0.9 % IV SOLN
2.0000 g | Freq: Once | INTRAVENOUS | Status: AC
  Administered 2020-07-14: 2 g via INTRAVENOUS
  Filled 2020-07-14: qty 20

## 2020-07-14 MED ORDER — FENTANYL CITRATE (PF) 100 MCG/2ML IJ SOLN
INTRAMUSCULAR | Status: DC | PRN
  Administered 2020-07-14 (×3): 50 ug via INTRAVENOUS

## 2020-07-14 MED ORDER — DEXAMETHASONE SODIUM PHOSPHATE 10 MG/ML IJ SOLN
INTRAMUSCULAR | Status: DC | PRN
  Administered 2020-07-14: 4 mg via INTRAVENOUS

## 2020-07-14 MED ORDER — MEPERIDINE HCL 50 MG/ML IJ SOLN
INTRAMUSCULAR | Status: AC
  Filled 2020-07-14: qty 1

## 2020-07-14 MED ORDER — HYDROCODONE-ACETAMINOPHEN 5-325 MG PO TABS: 1.0000 | ORAL_TABLET | ORAL | 0 refills | Status: DC | PRN

## 2020-07-14 MED ORDER — PROPOFOL 10 MG/ML IV BOLUS
INTRAVENOUS | Status: DC | PRN
  Administered 2020-07-14: 150 mg via INTRAVENOUS

## 2020-07-14 MED ORDER — CHLORHEXIDINE GLUCONATE CLOTH 2 % EX PADS: 6.0000 | MEDICATED_PAD | Freq: Once | CUTANEOUS | Status: DC

## 2020-07-14 MED ORDER — FENTANYL CITRATE (PF) 100 MCG/2ML IJ SOLN: 25.0000 ug | INTRAMUSCULAR | Status: DC | PRN

## 2020-07-14 MED ORDER — MEPERIDINE HCL 50 MG/ML IJ SOLN
12.5000 mg | Freq: Once | INTRAMUSCULAR | Status: AC
  Administered 2020-07-14: 12.5 mg via INTRAVENOUS

## 2020-07-14 MED ORDER — ONDANSETRON HCL 4 MG/2ML IJ SOLN
INTRAMUSCULAR | Status: DC | PRN
  Administered 2020-07-14: 4 mg via INTRAVENOUS

## 2020-07-14 MED ORDER — FENTANYL CITRATE (PF) 250 MCG/5ML IJ SOLN
INTRAMUSCULAR | Status: AC
  Filled 2020-07-14: qty 5

## 2020-07-14 MED ORDER — LIDOCAINE HCL (CARDIAC) PF 100 MG/5ML IV SOSY
PREFILLED_SYRINGE | INTRAVENOUS | Status: DC | PRN
  Administered 2020-07-14: 40 mg via INTRATRACHEAL

## 2020-07-14 MED ORDER — ROCURONIUM BROMIDE 10 MG/ML (PF) SYRINGE
PREFILLED_SYRINGE | INTRAVENOUS | Status: DC | PRN
  Administered 2020-07-14: 30 mg via INTRAVENOUS
  Administered 2020-07-14: 10 mg via INTRAVENOUS

## 2020-07-14 MED ORDER — SUCCINYLCHOLINE CHLORIDE 200 MG/10ML IV SOSY
PREFILLED_SYRINGE | INTRAVENOUS | Status: DC | PRN
  Administered 2020-07-14: 120 mg via INTRAVENOUS

## 2020-07-14 MED ORDER — SUGAMMADEX SODIUM 500 MG/5ML IV SOLN
INTRAVENOUS | Status: DC | PRN
  Administered 2020-07-14: 200 mg via INTRAVENOUS

## 2020-07-14 SURGICAL SUPPLY — 50 items
ADH SKN CLS APL DERMABOND .7 (GAUZE/BANDAGES/DRESSINGS) ×1
APL PRP STRL LF DISP 70% ISPRP (MISCELLANEOUS) ×1
BAG RETRIEVAL 10 (BASKET) ×1
BLADE SURG 15 STRL LF DISP TIS (BLADE) ×1 IMPLANT
BLADE SURG 15 STRL SS (BLADE) ×2
CHLORAPREP W/TINT 26 (MISCELLANEOUS) ×2 IMPLANT
CLOTH BEACON ORANGE TIMEOUT ST (SAFETY) ×2 IMPLANT
COVER LIGHT HANDLE STERIS (MISCELLANEOUS) ×4 IMPLANT
COVER WAND RF STERILE (DRAPES) ×2 IMPLANT
CUTTER FLEX LINEAR 45M (STAPLE) ×2 IMPLANT
DERMABOND ADVANCED (GAUZE/BANDAGES/DRESSINGS) ×1
DERMABOND ADVANCED .7 DNX12 (GAUZE/BANDAGES/DRESSINGS) ×1 IMPLANT
ELECT REM PT RETURN 9FT ADLT (ELECTROSURGICAL) ×2
ELECTRODE REM PT RTRN 9FT ADLT (ELECTROSURGICAL) ×1 IMPLANT
GLOVE SURG ENC MOIS LTX SZ6.5 (GLOVE) ×2 IMPLANT
GLOVE SURG UNDER POLY LF SZ6.5 (GLOVE) ×2 IMPLANT
GLOVE SURG UNDER POLY LF SZ7 (GLOVE) ×6 IMPLANT
GOWN STRL REUS W/TWL LRG LVL3 (GOWN DISPOSABLE) ×4 IMPLANT
INST SET LAPROSCOPIC AP (KITS) ×2 IMPLANT
KIT TURNOVER KIT A (KITS) ×2 IMPLANT
MANIFOLD NEPTUNE II (INSTRUMENTS) ×2 IMPLANT
NDL HYPO 18GX1.5 BLUNT FILL (NEEDLE) ×1 IMPLANT
NDL HYPO 21X1.5 SAFETY (NEEDLE) ×1 IMPLANT
NDL INSUFFLATION 14GA 120MM (NEEDLE) ×1 IMPLANT
NEEDLE HYPO 18GX1.5 BLUNT FILL (NEEDLE) ×2 IMPLANT
NEEDLE HYPO 21X1.5 SAFETY (NEEDLE) ×2 IMPLANT
NEEDLE INSUFFLATION 14GA 120MM (NEEDLE) ×2 IMPLANT
NS IRRIG 1000ML POUR BTL (IV SOLUTION) ×2 IMPLANT
PACK LAP CHOLE LZT030E (CUSTOM PROCEDURE TRAY) ×2 IMPLANT
PAD ARMBOARD 7.5X6 YLW CONV (MISCELLANEOUS) ×2 IMPLANT
RELOAD 45 VASCULAR/THIN (ENDOMECHANICALS) IMPLANT
RELOAD STAPLE 45 2.5 WHT GRN (ENDOMECHANICALS) IMPLANT
RELOAD STAPLE 45 3.5 BLU ETS (ENDOMECHANICALS) IMPLANT
RELOAD STAPLE TA45 3.5 REG BLU (ENDOMECHANICALS) ×2 IMPLANT
SET BASIN LINEN APH (SET/KITS/TRAYS/PACK) ×2 IMPLANT
SET TUBE IRRIG SUCTION NO TIP (IRRIGATION / IRRIGATOR) IMPLANT
SET TUBE SMOKE EVAC HIGH FLOW (TUBING) ×2 IMPLANT
SHEARS HARMONIC ACE PLUS 36CM (ENDOMECHANICALS) ×2 IMPLANT
SUT MNCRL AB 4-0 PS2 18 (SUTURE) ×4 IMPLANT
SUT VICRYL 0 UR6 27IN ABS (SUTURE) ×2 IMPLANT
SYR 20ML LL LF (SYRINGE) ×4 IMPLANT
SYS BAG RETRIEVAL 10MM (BASKET) ×1
SYSTEM BAG RETRIEVAL 10MM (BASKET) ×1 IMPLANT
TRAY FOLEY W/BAG SLVR 16FR (SET/KITS/TRAYS/PACK) ×2
TRAY FOLEY W/BAG SLVR 16FR ST (SET/KITS/TRAYS/PACK) ×1 IMPLANT
TROCAR ENDO BLADELESS 11MM (ENDOMECHANICALS) ×2 IMPLANT
TROCAR ENDO BLADELESS 12MM (ENDOMECHANICALS) ×2 IMPLANT
TROCAR XCEL NON-BLD 5MMX100MML (ENDOMECHANICALS) ×2 IMPLANT
TUBE CONNECTING 12X1/4 (SUCTIONS) ×2 IMPLANT
WARMER LAPAROSCOPE (MISCELLANEOUS) ×2 IMPLANT

## 2020-07-14 NOTE — ED Provider Notes (Signed)
[facility] EMERGENCY DEPARTMENT Provider Note   CSN: [id number] Arrival date & time: 07/14/20  0354   History Chief Complaint  Patient presents with  . Flank Pain    left    Maria [name] is a 60 y.o. female.  The history is provided by the patient. The history is limited by the condition of the patient (Mental retardation).  Flank Pain  She has history of seizure disorder, intellectual disability, recent hospitalization for pyelonephritis comes in complaining of left lower abdominal pain with associated nausea.  She denies fever but has had chills and sweats.  She cannot tell me how long her symptoms have been present.  Past Medical History:  Diagnosis Date  . Constipation   . Dysphagia   . Fracture    R foot  . GERD (gastroesophageal reflux disease)   . History of shingles 10/2016  . Mental retardation    lesion in head  . Seizures ([place])    "not fully controlled on max doses of meds" (Neuro ofc note 12/2014)  . Thrombocytopenia ([place])   . Tremor     Patient Active Problem List   Diagnosis Date Noted  . ESBL (extended spectrum beta-lactamase) producing bacteria infection 05/12/2020  . Bacteremia due to Escherichia coli 05/12/2020  . Severe sepsis ([place]) 05/12/2020  . Debility 05/05/2020  . Pressure injury of skin 04/24/2020  . Palliative care by specialist   . Pyelonephritis of left kidney 04/14/2020  . Renal abscess, left 04/14/2020  . Leukocytosis 04/14/2020  . Fever 04/14/2020  . LLQ abdominal pain 04/14/2020  . Dysphagia   . AKI (acute kidney injury) ([place])   . Sepsis secondary to UTI ([place])   . Pain in left ankle and joints of left foot 12/02/2019  . Positive colorectal cancer screening using Cologuard test 10/28/2018  . Posterior tibial tendinitis, right leg 04/23/2016  . Pain in right foot 04/12/2016  . Acute encephalopathy   . Myoclonus   . Frequent falls 11/09/2015  . Fall 11/09/2015  . Chest pain 03/16/2014  . Upper abdominal pain 03/16/2014  .  Seizure disorder ([place]) 03/16/2014  . 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 Generalized convulsive epilepsy ([place]) 10/06/2012  . DNR (do not resuscitate) discussion 10/06/2012  . Intellectual disability   . Thrombocytopenia ([place]) 05/23/2011  . Seizures ([place]) 03/21/2011  . GERD (gastroesophageal reflux disease) 03/21/2011  . CLOSED FRACTURE OF ACROMIAL END OF CLAVICLE 07/13/2008    Past Surgical History:  Procedure Laterality Date  . BIOPSY  09/16/2014   Procedure: BIOPSY;  Surgeon: Rogene Houston, MD;  Location: [facility] ORS;  Service: Endoscopy;;  . CATARACT EXTRACTION     both eyes, May of 2015  . COLONOSCOPY    . COLONOSCOPY WITH PROPOFOL N/A 11/20/2018   Procedure: COLONOSCOPY WITH PROPOFOL;  Surgeon: Rogene Houston, MD;  Location: [facility] ENDO SUITE;  Service: Endoscopy;  Laterality: N/A;  . ESOPHAGEAL DILATION N/A 09/16/2014   Procedure: ESOPHAGEAL DILATION WITH 54FR MALONEY DILATOR;  Surgeon: Rogene Houston, MD;  Location: [facility] ORS;  Service: Endoscopy;  Laterality: N/A;  . ESOPHAGOGASTRODUODENOSCOPY (EGD) WITH PROPOFOL N/A 09/16/2014   Procedure: ESOPHAGOGASTRODUODENOSCOPY (EGD) WITH PROPOFOL;  Surgeon: Rogene Houston, MD;  Location: [facility] ORS;  Service: Endoscopy;  Laterality: N/A;  . MOUTH SURGERY    . POLYPECTOMY  11/20/2018   Procedure: POLYPECTOMY;  Surgeon: Rogene Houston, MD;  Location: [facility] END[facility] SUITE;  Service: Endoscopy;;  colon  . Skin graft to gum Right 08/2013  .  TONSILLECTOMY AND ADENOIDECTOMY    . TOTAL ABDOMINAL HYSTERECTOMY       OB History   No obstetric history on file.     Family History  Problem Relation Age of Onset  . High Cholesterol Mother   . High blood pressure Mother   . Diabetes Father     Social History   Tobacco Use  . Smoking status: Former Smoker    Packs/day: 1.50    Years: 15.00    Pack years: 22.50    Types: Cigarettes    Quit date: 05/22/2006    Years since quitting: 14.1  . Smokeless tobacco: Never Used  Vaping Use  . Vaping  Use: Never used  Substance Use Topics  . Alcohol use: No    Alcohol/week: 0.0 standard drinks  . Drug use: No    Home Medications Prior to Admission medications   Medication Sig Start Date End Date Taking? Authorizing Provider  acetaminophen (TYLENOL) 500 MG tablet Take 1 tablet (500 mg total) by mouth every 6 (six) hours as needed for moderate pain. 05/05/20   Elgergawy, Silver Huguenin, MD  atorvastatin (LIPITOR) 20 MG tablet Take 20 mg by mouth daily. 08/24/18   [provider]  benzonatate (TESSALON) 200 MG capsule Take 1 capsule (200 mg total) by mouth 3 (three) times daily. 05/31/20   Angiulli, Lavon Paganini, PA-C  clonazePAM (KLONOPIN) 0.25 MG disintegrating tablet Take 1 tablet (0.25 mg total) by mouth 2 (two) times daily. 05/31/20   Angiulli, Lavon Paganini, PA-C  divalproex (DEPAKOTE) 500 MG DR tablet Take 2 tablets (1,000 mg total) by mouth daily. Patient taking differently: Take 500 mg by mouth 2 (two) times daily. 05/31/20   Angiulli, Lavon Paganini, PA-C  docusate sodium (COLACE) 100 MG capsule Take 1 capsule (100 mg total) by mouth 2 (two) times daily. Patient taking differently: Take 100 mg by mouth 2 (two) times daily as needed for mild constipation. 05/05/20   Elgergawy, Silver Huguenin, MD  feeding supplement (ENSURE ENLIVE / ENSURE PLUS) LIQD Take 237 mLs by mouth 3 (three) times daily between meals. 05/05/20   Elgergawy, Silver Huguenin, MD  Fluticasone-Salmeterol (ADVAIR DISKUS) 100-50 MCG/DOSE AEPB Inhale 1 puff into the lungs in the morning and at bedtime. 05/31/20 05/31/21  Angiulli, Lavon Paganini, PA-C  HYDROcodone-acetaminophen (NORCO) 5-325 MG tablet Take 1-2 tablets by mouth every 6 (six) hours as needed for severe pain. 06/07/20   Elnora Morrison, MD  lacosamide (VIMPAT) 200 MG TABS tablet Take 1 tablet (200 mg total) by mouth 2 (two) times daily. 05/31/20   Angiulli, Lavon Paganini, PA-C  levocetirizine (XYZAL) 5 MG tablet Take 5 mg by mouth every evening.    [provider]  LORazepam (ATIVAN) 0.5  MG tablet Take 0.5 mg by mouth daily. For 5 days, then stop.    [provider]  melatonin 3 MG TABS tablet Take 1 tablet (3 mg total) by mouth at bedtime as needed (insomina). 05/31/20   Angiulli, Lavon Paganini, PA-C  metoprolol tartrate (LOPRESSOR) 25 MG tablet Take 1 tablet (25 mg total) by mouth 2 (two) times daily. 05/31/20   Angiulli, Lavon Paganini, PA-C  omeprazole (PRILOSEC) 40 MG capsule Take 1 capsule (40 mg total) by mouth daily. 11/03/19   Laurine Blazer B, PA-C  ondansetron (ZOFRAN ODT) 4 MG disintegrating tablet 4mg  ODT q4 hours prn nausea/vomit 06/07/20   Elnora Morrison, MD  oxybutynin (DITROPAN) 5 MG tablet Take 5 mg by mouth at bedtime.    [provider]  Pediatric Multiple Vit-C-FA (PEDIATRIC MULTIVITAMIN) chewable tablet Chew 1 tablet by mouth daily.    [provider]    Allergies    Codeine and Lamictal [lamotrigine]  Review of Systems   Review of Systems  Unable to perform ROS: Other  Genitourinary: Positive for flank pain.    Physical Exam Updated Vital Signs BP (!) 121/54 (BP Location: Left Arm)   Pulse 68   Temp 98 F (36.7 C) (Oral)   Resp 18   Ht 5\' 6"  (1.676 m)   Wt 60.8 kg   SpO2 100%   BMI 21.63 kg/m   Physical Exam Vitals and nursing note reviewed.   60 year old female, resting comfortably and in no acute distress. Vital signs are normal. Oxygen saturation is 100%, which is normal. Head is normocephalic and atraumatic. PERRLA, EOMI. Oropharynx is clear. Neck is nontender and supple without adenopathy or JVD. Back is nontender and there is no CVA tenderness. Lungs are clear without rales, wheezes, or rhonchi. Chest is nontender. Heart has regular rate and rhythm without murmur. Abdomen is soft, slightly distended, diffusely tender.  There is no rebound or guarding.  There are no masses or hepatosplenomegaly and peristalsis is hypoactive. Extremities have no cyanosis or edema, full range of motion is present. Skin is warm and dry  without rash. Neurologic: Mental status is normal, cranial nerves are intact, there are no motor or sensory deficits.  ED Results / Procedures / Treatments   Labs (all labs ordered are listed, but only abnormal results are displayed) Labs Reviewed  COMPREHENSIVE METABOLIC PANEL - Abnormal; Notable for the following components:      Result Value   Glucose, Bld 122 (*)    BUN 23 (*)    All other components within normal limits  CBC WITH DIFFERENTIAL/PLATELET - Abnormal; Notable for the following components:   WBC 16.9 (*)    Hemoglobin 11.5 (*)    Platelets 130 (*)    Neutro Abs 12.7 (*)    Monocytes Absolute 1.6 (*)    Abs Immature Granulocytes 0.08 (*)    All other components within normal limits  LIPASE, BLOOD  URINALYSIS, ROUTINE W REFLEX MICROSCOPIC    EKG EKG Interpretation  Date/Time:  Friday July 14 2020 03:58:03 EST Ventricular Rate:  74 PR Interval:  148 QRS Duration: 114 QT Interval:  442 QTC Calculation: 490 R Axis:   61 Text Interpretation: Normal sinus rhythm Possible Left atrial enlargement Low voltage QRS Incomplete right bundle branch block Nonspecific ST abnormality Prolonged QT Abnormal ECG When compared with ECG of 05/01/2020, No significant change was found Confirmed by Delora Fuel ([id number]) on 07/14/2020 4:25:22 AM   Radiology CT ABDOMEN PELVIS W CONTRAST  Result Date: 07/14/2020 CLINICAL DATA:  Left-sided flank pain. EXAM: CT ABDOMEN AND PELVIS WITH CONTRAST TECHNIQUE: Multidetector CT imaging of the abdomen and pelvis was performed using the standard protocol following bolus administration of intravenous contrast. CONTRAST:  178mL OMNIPAQUE IOHEXOL 300 MG/ML  SOLN COMPARISON:  06/07/2020 FINDINGS: Lower chest: Bibasilar atelectasis. Hepatobiliary: No suspicious focal abnormality within the liver parenchyma. Tiny cyst in the lateral segment left liver is stable. There is no evidence for gallstones, gallbladder wall thickening, or pericholecystic fluid.  No intrahepatic or extrahepatic biliary dilation. Pancreas: No focal mass lesion. No dilatation of the main duct. No intraparenchymal cyst. No peripancreatic edema. Spleen: No splenomegaly. No focal mass lesion. Adrenals/Urinary Tract: No adrenal nodule or mass. Left kidney is atrophic. Ill-defined hypoperfusion again identified upper pole left kidney  measuring approximately 2.3 cm on image 24 of series 2. This is similar compared to prior study and also back to 05/26/2020. Scattered tiny hypodensities noted in both kidneys, too small to characterize but likely benign. No evidence for hydroureter. The urinary bladder appears normal for the degree of distention. Stomach/Bowel: Stomach is unremarkable. No gastric wall thickening. No evidence of outlet obstruction. Duodenum is normally positioned as is the ligament of Treitz. No small bowel wall thickening. No small bowel dilatation. The terminal ileum is normal. The appendix is markedly dilated up to 13 mm diameter with periappendiceal edema and inflammation. 10 x 6 mm appendicolith is noted in the base of the appendix. No gross colonic mass. No colonic wall thickening. Vascular/Lymphatic: There is abdominal aortic atherosclerosis without aneurysm. There is no gastrohepatic or hepatoduodenal ligament lymphadenopathy. No retroperitoneal or mesenteric lymphadenopathy. No pelvic sidewall lymphadenopathy. Reproductive: The uterus is surgically absent. There is no adnexal mass. Other: Trace free fluid noted in the right pelvis and cul-de-sac. Musculoskeletal: No worrisome lytic or sclerotic osseous abnormality. IMPRESSION: 1. Markedly dilated appendix with periappendiceal edema / inflammation consistent with acute appendicitis. 10 x 6 mm appendicolith noted in the base of the appendix. Small volume free fluid in the right pelvis and cul-de-sac may be secondary although perforation not excluded. 2. 2.3 cm ill-defined hypoperfusion again identified upper pole left kidney,  similar to prior study and also back to 05/26/2020. Given persistence, follow-up abdominal MRI with and without contrast, after resolution of patient's acute symptoms, is recommended to exclude underlying subtle or infiltrative mass lesion. 3. Aortic Atherosclerosis (ICD10-I70.0). Electronically Signed   By: [name] M.D.   On: 07/14/2020 07:00    Procedures Procedures   Medications Ordered in ED Medications  cefTRIAXone (ROCEPHIN) 2 g in sodium chloride 0.9 % 100 mL IVPB (has no administration in time range)  metroNIDAZOLE (FLAGYL) IVPB 500 mg (has no administration in time range)  lactated ringers bolus 1,000 mL (has no administration in time range)  ondansetron (ZOFRAN) injection 4 mg (4 mg Intravenous Given 07/14/20 0505)  morphine 4 MG/ML injection 4 mg (4 mg Intravenous Given 07/14/20 0505)  iohexol (OMNIPAQUE) 300 MG/ML solution 100 mL (100 mLs Intravenous Contrast Given 07/14/20 5681)    ED Course  I have reviewed the triage vital signs and the nursing notes.  Pertinent labs & imaging results that were available during my care of the patient were reviewed by me and considered in my medical decision making (see chart for details).  MDM Rules/Calculators/A&P Left lower quadrant abdominal pain of uncertain cause.  Given her history of pyelonephritis, need to be concerned about recurrent pyelonephritis.  Also consider diverticulitis, urolithiasis, abdominal aneurysm.  Old records are reviewed, and she was hospitalized for pyelonephritis on 04/14/2020, and again on 05/12/2020 as well as an ED visit for pyelonephritis on 06/06/2020.  Will check screening labs and urinalysis and send for CT of abdomen and pelvis.  ECG shows borderline prolonged QT interval, but not changed from baseline.  CT scan shows acute appendicitis with possible small perforation.  She is started on antibiotics of ceftriaxone and metronidazole.  Labs show elevated WBC, mild thrombocytopenia which is not clinically  significant.  Also, BUN is significantly elevated with creatinine unchanged from baseline indicating probable dehydration.  She is given IV fluids.  General surgery has been consulted.  Dr. Constance Haw states she will look at the CT scan and evaluate the patient.  Final Clinical Impression(s) / ED Diagnoses Final diagnoses:  Acute appendicitis, unspecified acute  appendicitis type  Thrombocytopenia (HCC)  Normochromic normocytic anemia    Rx / DC Orders ED Discharge Orders    None       Delora Fuel, MD 37/04/88 [phone]

## 2020-07-14 NOTE — ED Triage Notes (Addendum)
Pt brought in by [facility] for left sided flank pain. She was admitted to [facility] on November 12 with sepsis secondary to E. coli urinary tract infection with pyelonephritis. Pt saw urology on 06/10/19 for pyelonephritis of left kidney.

## 2020-07-14 NOTE — Anesthesia Postprocedure Evaluation (Signed)
Anesthesia Post Note  Patient: Maria [name]  Procedure(s) Performed: APPENDECTOMY LAPAROSCOPIC (N/A Abdomen)  Patient location during evaluation: PACU Anesthesia Type: General Level of consciousness: awake and alert Pain management: satisfactory to patient Vital Signs Assessment: post-procedure vital signs reviewed and stable Respiratory status: spontaneous breathing and respiratory function stable Cardiovascular status: blood pressure returned to baseline and stable Postop Assessment: no apparent nausea or vomiting Anesthetic complications: no   No complications documented.   Last Vitals:  Vitals:   07/14/20 1101 07/14/20 1216  BP: (!) 145/92 (!) 151/81  Pulse: 95 100  Resp: 16 12  Temp: 36.7 C 36.7 C  SpO2: 96% 100%    Last Pain:  Vitals:   07/14/20 1101  TempSrc: Oral  PainSc: 6                  Karna Dupes

## 2020-07-14 NOTE — Discharge Instructions (Signed)
Discharge Laparoscopic Surgery Instructions:  Common Complaints: Right shoulder pain is common after laparoscopic surgery. This is secondary to the gas used in the surgery being trapped under the diaphragm.  Walk to help your body absorb the gas. This will improve in a few days. Pain at the port sites are common, especially the larger port sites. This will improve with time.  Some nausea is common and poor appetite. The main goal is to stay hydrated the first few days after surgery.   Diet/ Activity: Diet as tolerated. You may not have an appetite, but it is important to stay hydrated. Drink 64 ounces of water a day. Your appetite will return with time.  Shower per your regular routine daily.  Do not take hot showers. Take warm showers that are less than 10 minutes. Rest and listen to your body, but do not remain in bed all day.  Walk everyday for at least 15-20 minutes. Deep cough and move around every 1-2 hours in the first few days after surgery.  Do not lift > 10 lbs, perform excessive bending, pushing, pulling, squatting for 1-2 weeks after surgery.  Do not pick at the dermabond glue on your incision sites.  This glue film will remain in place for 1-2 weeks and will start to peel off.  Do not place lotions or balms on your incision unless instructed to specifically by Dr. Constance Haw.   Pain Expectations and Narcotics: -After surgery you will have pain associated with your incisions and this is normal. The pain is muscular and nerve pain, and will get better with time. -You are encouraged and expected to take non narcotic medications like tylenol and ibuprofen (when able) to treat pain as multiple modalities can aid with pain treatment. -Narcotics are only used when pain is severe or there is breakthrough pain. -You are not expected to have a pain score of 0 after surgery, as we cannot prevent pain. A pain score of 3-4 that allows you to be functional, move, walk, and tolerate some activity is  the goal. The pain will continue to improve over the days after surgery and is dependent on your surgery. -Due to [place] law, we are only able to give a certain amount of pain medication to treat post operative pain, and we only give additional narcotics on a patient by patient basis.  -For most laparoscopic surgery, studies have shown that the majority of patients only need 10-15 narcotic pills, and for open surgeries most patients only need 15-20.   -Having appropriate expectations of pain and knowledge of pain management with non narcotics is important as we do not want anyone to become addicted to narcotic pain medication.  -Using ice packs in the first 48 hours and heating pads after 48 hours, wearing an abdominal binder (when recommended), and using over the counter medications are all ways to help with pain management.   -Simple acts like meditation and mindfulness practices after surgery can also help with pain control and research has proven the benefit of these practices.  Medication: Take tylenol and ibuprofen as needed for pain control, alternating every 4-6 hours.  Example:  Tylenol 1000mg  @ 6am, 12noon, 6pm, 14midnight (Do not exceed 4000mg  of tylenol a day).  Ibuprofen 800mg  @ 9am, 3pm, 9pm, 3am (Do not exceed 3600mg  of ibuprofen a day).  Take Norco for breakthrough pain every 4 hours but do not exceed tylenol 4000 mg. Norco has 325mg  of tylenol in each pill.   Take Colace for constipation related  to narcotic pain medication. If you do not have a bowel movement in 2 days, take Miralax over the counter.  Drink plenty of water to also prevent constipation.   Contact Information: If you have questions or concerns, please call our office, [phone], Monday- Thursday 8AM-5PM and Friday 8AM-12Noon.  If it is after hours or on the weekend, please call [facility], [phone], [phone], and ask to speak to the surgeon on call for Dr. Constance Haw at [facility].      Laparoscopic  Appendectomy, Adult, Care After This sheet gives you information about how to care for yourself after your procedure. Your health care provider may also give you more specific instructions. If you have problems or questions, contact your health care provider. What can I expect after the procedure? After the procedure, it is common to have:  Little energy for normal activities.  Mild pain in the area where the incisions were made.  Difficulty passing stool (constipation). This can be caused by: ? Pain medicine. ? A decrease in your activity. Follow these instructions at home: Medicines  Take over-the-counter and prescription medicines only as told by your health care provider.  If you were prescribed an antibiotic medicine, take it as told by your health care provider. Do not stop taking the antibiotic even if you start to feel better.  Do not drive or use heavy machinery while taking prescription pain medicine.  Ask your health care provider if the medicine prescribed to you can cause constipation. You may need to take steps to prevent or treat constipation, such as: ? Drink enough fluid to keep your urine pale yellow. ? Take over-the-counter or prescription medicines. ? Eat foods that are high in fiber, such as beans, whole grains, and fresh fruits and vegetables. ? Limit foods that are high in fat and processed sugars, such as fried or sweet foods. Incision care  Follow instructions from your health care provider about how to take care of your incisions. Make sure you: ? Wash your hands with soap and water before and after you change your bandage (dressing). If soap and water are not available, use hand sanitizer. ? Change your dressing as told by your health care provider. ? Leave stitches (sutures), skin glue, or adhesive strips in place. These skin closures may need to stay in place for 2 weeks or longer. If adhesive strip edges start to loosen and curl up, you may trim the loose  edges. Do not remove adhesive strips completely unless your health care provider tells you to do that.  Check your incision areas every day for signs of infection. Check for: ? Redness, swelling, or pain. ? Fluid or blood. ? Warmth. ? Pus or a bad smell.   Bathing  Keep your incisions clean and dry. Clean them as often as told by your health care provider. To do this: 1. Gently wash the incisions with soap and water. 2. Rinse the incisions with water to remove all soap. 3. Pat the incisions dry with a clean towel. Do not rub the incisions.  Do not take baths, swim, or use a hot tub for 3 weeks, or until your health care provider approves. You may shower.  Activity  Do not drive for 24 hours if you were given a sedative during your procedure.  Rest after the procedure. Return to your normal activities as told by your health care provider. Ask your health care provider what activities are safe for you.  For 3 weeks, or  for as long as told by your health care provider: ? Do not lift anything that is heavier than 10 lb (4.5 kg), or the limit that you are told. ? Do not play contact sports.   General instructions  If you were sent home with a drain, follow instructions from your health care provider about how to care for it.  Take deep breaths. This helps to prevent your lungs from developing an infection (pneumonia).  Keep all follow-up visits as told by your health care provider. This is important. Contact a health care provider if:  You have redness, swelling, or pain around an incision.  You have fluid or blood coming from an incision.  Your incision feels warm to the touch.  You have pus or a bad smell coming from an incision or dressing.  Your incision edges break open after your sutures have been removed.  You have increasing pain in your shoulders.  You feel dizzy or you faint.  You develop shortness of breath.  You keep feeling nauseous or you are vomiting.  You  have diarrhea or you cannot control your bowel functions.  You lose your appetite.  You develop swelling or pain in your legs.  You develop a rash. Get help right away if you have:  A fever.  Difficulty breathing.  Sharp pains in your chest. Summary  After a laparoscopic appendectomy, it is common to have little energy for normal activities, mild pain in the area of the incisions, and constipation.  Infection is the most common complication after this procedure. Follow your health care provider's instructions about caring for yourself after the procedure.  Rest after the procedure. Return to your normal activities as told by your health care provider.  Contact your health care provider if you notice signs of infection around your incisions or you develop shortness of breath. Get help right away if you have a fever, chest pain, or difficulty breathing. This information is not intended to replace advice given to you by your health care provider. Make sure you discuss any questions you have with your health care provider. Document Revised: 11/20/2017 Document Reviewed: 11/20/2017 Elsevier Patient Education  2021 [employer].

## 2020-07-14 NOTE — Anesthesia Preprocedure Evaluation (Signed)
Anesthesia Evaluation  Patient identified by MRN, date of birth, ID band Patient awake    Reviewed: Allergy & Precautions, H&P , NPO status , Patient's Chart, lab work & pertinent test results, reviewed documented beta blocker date and time , Unable to perform ROS - Chart review only  Airway Mallampati: II  TM Distance: >3 FB Neck ROM: full    Dental no notable dental hx.    Pulmonary neg pulmonary ROS, former smoker,    Pulmonary exam normal breath sounds clear to auscultation       Cardiovascular Exercise Tolerance: Good negative cardio ROS   Rhythm:regular Rate:Normal     Neuro/Psych Seizures -, Well Controlled,  PSYCHIATRIC DISORDERS    GI/Hepatic Neg liver ROS, GERD  Medicated,  Endo/Other  negative endocrine ROS  Renal/GU ARFRenal disease  negative genitourinary   Musculoskeletal   Abdominal   Peds  Hematology negative hematology ROS (+)   Anesthesia Other Findings   Reproductive/Obstetrics negative OB ROS                             Anesthesia Physical Anesthesia Plan  ASA: II and emergent  Anesthesia Plan: General   Post-op Pain Management:    Induction:   PONV Risk Score and Plan: Ondansetron  Airway Management Planned:   Additional Equipment:   Intra-op Plan:   Post-operative Plan:   Informed Consent: I have reviewed the patients History and Physical, chart, labs and discussed the procedure including the risks, benefits and alternatives for the proposed anesthesia with the patient or authorized representative who has indicated his/her understanding and acceptance.     Dental Advisory Given  Plan Discussed with: CRNA  Anesthesia Plan Comments:         Anesthesia Quick Evaluation

## 2020-07-14 NOTE — ED Notes (Signed)
PIV initiated, labs obtained and sent, pt up to bathroom, attempted to provide urine sample unable to at this time. A/w CT

## 2020-07-14 NOTE — ED Provider Notes (Signed)
Patient seen by Dr. Constance Haw.  She is planning take the patient to the operating room.   Fredia Sorrow, MD 07/14/20 [phone]

## 2020-07-14 NOTE — Op Note (Signed)
[facility]  Date of Surgery: 07/14/2020  Admit Date: 07/14/2020   Performing Service: General  Surgeon(s) and Role:    * Virl Cagey, MD - Primary   Pre-operative Diagnosis: Acute Appendicitis  Post-operative Diagnosis: Acute Appendicitis  Procedure Performed: Laparoscopic Appendectomy   Surgeon: Lanell Matar. Constance Haw, MD   Assistant: No qualified resident was available.   Anesthesia: General   Findings:  The appendix was found to be inflamed. There were not signs of necrosis. There was not perforation. There was not abscess formation.   Estimated Blood Loss: Minimal   Specimens:  ID Type Source Tests Collected by Time Destination  1 : appendix Tissue PATH Appendix SURGICAL PATHOLOGY Virl Cagey, MD 01/10/1750 [id number]      Complications: None; patient tolerated the procedure well.   Disposition: PACU - hemodynamically stable.   Condition: stable   Indications: The patient presented with a 1 day history of right-sided abdominal pain. A CT revealed findings consistent with acute appendicitis.   Procedure Details  Prior to the procedure, the risks, benefits, complications, treatment options, and expected outcomes were discussed with the patient and/or family, including but not limited to the risk of bleeding, infection, finding of a normal appendix, and the need for conversion to an open procedure. There was concurrence with the proposed plan and informed consent was obtained. The patient was taken to the operating room, identified as [name] and the procedure verified as Laproscopic Appendectomy.    The patient was placed in the supine position and general anesthesia was induced, along with placement of orogastric tube, SCD's, and a Foley catheter. The abdomen was prepped and draped in a sterile fashion. The abdomen was entered with Veress technique in the infraumbilical incision. Intraperitoneal placement was confirmed with saline drop, low  entry pressures, and easy insufflation. A 11 mm optiview trocar was placed under direct visualization with a 0 degree scope. The 10 mm 0 degree scope was placed in the abdomen and no evidence of injury was identified. A 12 mm port was placed in the left lower quadrant of the abdomen after skin incision with trocar placement under direct vision. A careful evaluation of the entire abdomen was carried out. An additional 5 mm port was placed in the suprapubic area under direct vision.  The patient was placed in Trendelenburg and left lateral decubitus position. The small intestines were retracted in the cephalad and left lateral direction away from the pelvis and right lower quadrant. The patient was found to have an inflamed and enlarged appendix. There was not evidence of perforation.   The appendix was carefully dissected. A window was made in the mesoappendix at the base of the appendix. The appendix was divided at its base using a standard endo-GIA stapler. Minimal appendiceal stump was left in place. The mesoappendix was taken with the harmonic energy device. The appendix was placed within an Endocatch specimen bag. There was no evidence of bleeding, leakage, or complication after division of the appendix.  Any remaining blood or pus was suctioned out from the abdomen, hemostasis was confirmed. The endocatch bag was removed via the 12 mm port, then the abdomen desufflated. The appendix was passed off the field as a specimen.   The the 12 mm and 10 mm port sites were closed with a 0 Vicryl suture. The trocar site skin wounds were closed using subcuticular 4-0 Monocryl suture and dermabond. The patient was then awakened from general anesthesia, extubated, and taken to PACU for recovery.  Instrument, sponge, and needle counts were correct at the conclusion of the case.   [name], MD [facility] [address] [place], [place] [address] [phone]([phone])

## 2020-07-14 NOTE — H&P (Signed)
[facility] History and Physical  Reason for Referral: Acute appendicitis  Referring Physician:  Dr. Roxanne Mins   Chief Complaint    Flank Pain      Maria [name] is a 60 y.o. female.  HPI: Ms. Sugarman is a 60 yo some mental delays and history of seizures who comes in with reported abdominal pain and nausea but no vomiting. She lives at home with her mother who cares for her. She has had recent admission for pyelonephritis and pneumonia per her report. She says she hurts but is unable to give specifics when as further. She reports feeling poorly.  She has no fever in the ED and does have a CT that demonstrates appendicitis.   Past Medical History:  Diagnosis Date  . Constipation   . Dysphagia   . Fracture    R foot  . GERD (gastroesophageal reflux disease)   . History of shingles 10/2016  . Mental retardation    lesion in head  . Seizures ([place])    "not fully controlled on max doses of meds" (Neuro ofc note 12/2014)  . Thrombocytopenia ([place])   . Tremor     Past Surgical History:  Procedure Laterality Date  . BIOPSY  09/16/2014   Procedure: BIOPSY;  Surgeon: Rogene Houston, MD;  Location: [facility];  Service: Endoscopy;;  . CATARACT EXTRACTION     both eyes, May of 2015  . COLONOSCOPY    . COLONOSCOPY WITH PROPOFOL N/A 11/20/2018   Procedure: COLONOSCOPY WITH PROPOFOL;  Surgeon: Rogene Houston, MD;  [facility];  Service: Endoscopy;  Laterality: N/A;  . ESOPHAGEAL DILATION N/A 09/16/2014   Procedure: ESOPHAGEAL DILATION WITH 54FR MALONEY DILATOR;  Surgeon: Rogene Houston, MD;  Location: [facility];  Service: Endoscopy;  Laterality: N/A;  . ESOPHAGOGASTRODUODENOSCOPY (EGD) WITH PROPOFOL N/A 09/16/2014   Procedure: ESOPHAGOGASTRODUODENOSCOPY (EGD) WITH PROPOFOL;  Surgeon: Rogene Houston, MD;  Location: [facility];  Service: Endoscopy;  Laterality: N/A;  . MOUTH SURGERY    . POLYPECTOMY  11/20/2018   Procedure: POLYPECTOMY;  Surgeon: Rogene Houston, MD;   [facility];  Service: Endoscopy;;  colon  . Skin graft to gum Right 08/2013  . TONSILLECTOMY AND ADENOIDECTOMY    . TOTAL ABDOMINAL HYSTERECTOMY      Family History  Problem Relation Age of Onset  . High Cholesterol Mother   . High blood pressure Mother   . Diabetes Father     Social History   Tobacco Use  . Smoking status: Former Smoker    Packs/day: 1.50    Years: 15.00    Pack years: 22.50    Types: Cigarettes    Quit date: 05/22/2006    Years since quitting: 14.1  . Smokeless tobacco: Never Used  Vaping Use  . Vaping Use: Never used  Substance Use Topics  . Alcohol use: No    Alcohol/week: 0.0 standard drinks  . Drug use: No    Medications: I have reviewed the patient's current medications. Current Facility-Administered Medications  Medication Dose Route Frequency Provider Last Rate Last Admin  . cefTRIAXone (ROCEPHIN) 2 g in sodium chloride 0.9 % 100 mL IVPB  2 g Intravenous Once Delora Fuel, MD      . lactated ringers bolus 1,000 mL  1,000 mL Intravenous Once Delora Fuel, MD      . metroNIDAZOLE (FLAGYL) IVPB 500 mg  500 mg Intravenous Once Delora Fuel, MD       Current Outpatient  Medications  Medication Sig Dispense Refill Last Dose  . acetaminophen (TYLENOL) 500 MG tablet Take 1 tablet (500 mg total) by mouth every 6 (six) hours as needed for moderate pain. 30 tablet 0   . atorvastatin (LIPITOR) 20 MG tablet Take 20 mg by mouth daily.     . benzonatate (TESSALON) 200 MG capsule Take 1 capsule (200 mg total) by mouth 3 (three) times daily. 20 capsule 0   . clonazePAM (KLONOPIN) 0.25 MG disintegrating tablet Take 1 tablet (0.25 mg total) by mouth 2 (two) times daily. 60 tablet 0   . divalproex (DEPAKOTE) 500 MG DR tablet Take 2 tablets (1,000 mg total) by mouth daily. (Patient taking differently: Take 500 mg by mouth 2 (two) times daily.) 60 tablet 0   . docusate sodium (COLACE) 100 MG capsule Take 1 capsule (100 mg total) by mouth 2 (two) times  daily. (Patient taking differently: Take 100 mg by mouth 2 (two) times daily as needed for mild constipation.) 10 capsule 0   . feeding supplement (ENSURE ENLIVE / ENSURE PLUS) LIQD Take 237 mLs by mouth 3 (three) times daily between meals. 237 mL 12   . Fluticasone-Salmeterol (ADVAIR DISKUS) 100-50 MCG/DOSE AEPB Inhale 1 puff into the lungs in the morning and at bedtime. 1 each 2   . HYDROcodone-acetaminophen (NORCO) 5-325 MG tablet Take 1-2 tablets by mouth every 6 (six) hours as needed for severe pain. 10 tablet 0   . lacosamide (VIMPAT) 200 MG TABS tablet Take 1 tablet (200 mg total) by mouth 2 (two) times daily. 180 tablet 1   . levocetirizine (XYZAL) 5 MG tablet Take 5 mg by mouth every evening.     Marland Kitchen LORazepam (ATIVAN) 0.5 MG tablet Take 0.5 mg by mouth daily. For 5 days, then stop.     . melatonin 3 MG TABS tablet Take 1 tablet (3 mg total) by mouth at bedtime as needed (insomina). 20 tablet 0   . metoprolol tartrate (LOPRESSOR) 25 MG tablet Take 1 tablet (25 mg total) by mouth 2 (two) times daily. 60 tablet 0   . omeprazole (PRILOSEC) 40 MG capsule Take 1 capsule (40 mg total) by mouth daily. 90 capsule 3   . ondansetron (ZOFRAN ODT) 4 MG disintegrating tablet 4mg  ODT q4 hours prn nausea/vomit 8 tablet 0   . oxybutynin (DITROPAN) 5 MG tablet Take 5 mg by mouth at bedtime.     . Pediatric Multiple Vit-C-FA (PEDIATRIC MULTIVITAMIN) chewable tablet Chew 1 tablet by mouth daily.      Allergies  Allergen Reactions  . Codeine Nausea And Vomiting  . Lamictal [Lamotrigine] Rash      ROS:  Limited slightly by Patient's ability to answer  A comprehensive review of systems was negative except for: Gastrointestinal: positive for abdominal pain and nausea  Blood pressure (!) 121/54, pulse 68, temperature 98 F (36.7 C), temperature source Oral, resp. rate 18, height 5\' 6"  (1.676 m), weight 60.8 kg, SpO2 100 %. Physical Exam Vitals reviewed.  HENT:     Head: Normocephalic.     Nose: Nose  normal.  Eyes:     Extraocular Movements: Extraocular movements intact.  Cardiovascular:     Rate and Rhythm: Normal rate.  Pulmonary:     Effort: Pulmonary effort is normal.  Abdominal:     General: There is distension.     Palpations: Abdomen is soft.     Tenderness: There is abdominal tenderness in the right lower quadrant and suprapubic area.  Musculoskeletal:  General: No swelling.  Skin:    General: Skin is warm.  Neurological:     Mental Status: She is alert. Mental status is at baseline.  Psychiatric:        Mood and Affect: Mood normal.     Results: Results for orders placed or performed during the hospital encounter of 07/14/20 (from the past 48 hour(s))  Comprehensive metabolic panel     Status: Abnormal   Collection Time: 07/14/20  5:20 AM  Result Value Ref Range   Sodium 139 135 - 145 mmol/L   Potassium 4.0 3.5 - 5.1 mmol/L   Chloride 102 98 - 111 mmol/L   CO2 27 22 - 32 mmol/L   Glucose, Bld 122 (H) 70 - 99 mg/dL    Comment: Glucose reference range applies only to samples taken after fasting for at least 8 hours.   BUN 23 (H) 6 - 20 mg/dL   Creatinine, Ser 0.89 0.44 - 1.00 mg/dL   Calcium 9.4 8.9 - 10.3 mg/dL   Total Protein 6.9 6.5 - 8.1 g/dL   Albumin 3.9 3.5 - 5.0 g/dL   AST 17 15 - 41 U/L   ALT 11 0 - 44 U/L   Alkaline Phosphatase 60 38 - 126 U/L   Total Bilirubin 0.6 0.3 - 1.2 mg/dL   GFR, Estimated >60 >60 mL/min    Comment: (NOTE) Calculated using the CKD-EPI Creatinine Equation (2021)    Anion gap 10 5 - 15    Comment: Performed at [facility], [address] [place], [place] [address]  Lipase, blood     Status: None   Collection Time: 07/14/20  5:20 AM  Result Value Ref Range   Lipase 26 11 - 51 U/L    Comment: Performed at [facility], [address], [place], [place] [address]  CBC with Differential     Status: Abnormal   Collection Time: 07/14/20  5:20 AM  Result Value Ref Range   WBC 16.9 (H) 4.0 - 10.5 K/uL   RBC 4.00 3.87 -  5.11 MIL/uL   Hemoglobin 11.5 (L) 12.0 - 15.0 g/dL   HCT 37.2 36.0 - 46.0 %   MCV 93.0 80.0 - 100.0 fL   MCH 28.8 26.0 - 34.0 pg   MCHC 30.9 30.0 - 36.0 g/dL   RDW 15.5 11.5 - 15.5 %   Platelets 130 (L) 150 - 400 K/uL   nRBC 0.0 0.0 - 0.2 %   Neutrophils Relative % 74 %   Neutro Abs 12.7 (H) 1.7 - 7.7 K/uL   Lymphocytes Relative 14 %   Lymphs Abs 2.4 0.7 - 4.0 K/uL   Monocytes Relative 10 %   Monocytes Absolute 1.6 (H) 0.1 - 1.0 K/uL   Eosinophils Relative 1 %   Eosinophils Absolute 0.1 0.0 - 0.5 K/uL   Basophils Relative 0 %   Basophils Absolute 0.0 0.0 - 0.1 K/uL   Immature Granulocytes 1 %   Abs Immature Granulocytes 0.08 (H) 0.00 - 0.07 K/uL    Comment: Performed at [facility], [address] [place],  [address]   Personally reviewed- large dilated appendix with appendicolith and stranding, minor fluid in pelvis likely physiologic  CT ABDOMEN PELVIS W CONTRAST  Result Date: 07/14/2020 CLINICAL DATA:  Left-sided flank pain. EXAM: CT ABDOMEN AND PELVIS WITH CONTRAST TECHNIQUE: Multidetector CT imaging of the abdomen and pelvis was performed using the standard protocol following bolus administration of intravenous contrast. CONTRAST:  134mL OMNIPAQUE IOHEXOL 300 MG/ML  SOLN  COMPARISON:  06/07/2020 FINDINGS: Lower chest: Bibasilar atelectasis. Hepatobiliary: No suspicious focal abnormality within the liver parenchyma. Tiny cyst in the lateral segment left liver is stable. There is no evidence for gallstones, gallbladder wall thickening, or pericholecystic fluid. No intrahepatic or extrahepatic biliary dilation. Pancreas: No focal mass lesion. No dilatation of the main duct. No intraparenchymal cyst. No peripancreatic edema. Spleen: No splenomegaly. No focal mass lesion. Adrenals/Urinary Tract: No adrenal nodule or mass. Left kidney is atrophic. Ill-defined hypoperfusion again identified upper pole left kidney measuring approximately 2.3 cm on image 24 of series 2. This is similar  compared to prior study and also back to 05/26/2020. Scattered tiny hypodensities noted in both kidneys, too small to characterize but likely benign. No evidence for hydroureter. The urinary bladder appears normal for the degree of distention. Stomach/Bowel: Stomach is unremarkable. No gastric wall thickening. No evidence of outlet obstruction. Duodenum is normally positioned as is the ligament of Treitz. No small bowel wall thickening. No small bowel dilatation. The terminal ileum is normal. The appendix is markedly dilated up to 13 mm diameter with periappendiceal edema and inflammation. 10 x 6 mm appendicolith is noted in the base of the appendix. No gross colonic mass. No colonic wall thickening. Vascular/Lymphatic: There is abdominal aortic atherosclerosis without aneurysm. There is no gastrohepatic or hepatoduodenal ligament lymphadenopathy. No retroperitoneal or mesenteric lymphadenopathy. No pelvic sidewall lymphadenopathy. Reproductive: The uterus is surgically absent. There is no adnexal mass. Other: Trace free fluid noted in the right pelvis and cul-de-sac. Musculoskeletal: No worrisome lytic or sclerotic osseous abnormality. IMPRESSION: 1. Markedly dilated appendix with periappendiceal edema / inflammation consistent with acute appendicitis. 10 x 6 mm appendicolith noted in the base of the appendix. Small volume free fluid in the right pelvis and cul-de-sac may be secondary although perforation not excluded. 2. 2.3 cm ill-defined hypoperfusion again identified upper pole left kidney, similar to prior study and also back to 05/26/2020. Given persistence, follow-up abdominal MRI with and without contrast, after resolution of patient's acute symptoms, is recommended to exclude underlying subtle or infiltrative mass lesion. 3. Aortic Atherosclerosis (ICD10-I70.0). Electronically Signed   By: [name] M.D.   On: 07/14/2020 07:00     Assessment & Plan:  TYCHELLE PURKEY is a 61 y.o. female with  findings of acute appendicitis with appendicolith. She has some mental delays. I have tried to reach her mother to discuss surgery but I could not get her on the phone at this time. COVID test is pending.   -Will try to call the mother back shortly      Virl Cagey 07/14/2020, 7:29 AM

## 2020-07-14 NOTE — ED Notes (Signed)
Transport to CT

## 2020-07-14 NOTE — ED Notes (Signed)
PT c/o of nausea. Pt actively dry heaving. EDP made aware. Zofran ordered.

## 2020-07-14 NOTE — Progress Notes (Signed)
[facility]  Discussed the risk of laparoscopic appendectomy and the option of antibiotics alone. Discussed that in [place] and some trials in the [place], antibiotics are used for simple appendicitis. Discussed that research shows a 40% failure rate for antibiotics alone.  Discussed risk of surgery including but not limited to bleeding, infection, injury to other organs, normal appendix, and after this discussion the patient has decided to proceed with surgery. Her sister and her mother are her dual POA and her sister is giving consent.  [name], MD [facility] [address] [place], [place] [address] [phone] (office)

## 2020-07-14 NOTE — Transfer of Care (Signed)
Immediate Anesthesia Transfer of Care Note  Patient: Maria [name]  Procedure(s) Performed: APPENDECTOMY LAPAROSCOPIC (N/A Abdomen)  Patient Location: PACU  Anesthesia Type:General  Level of Consciousness: awake and alert   Airway & Oxygen Therapy: Patient Spontanous Breathing and Patient connected to nasal cannula oxygen  Post-op Assessment: Report given to RN and Post -op Vital signs reviewed and stable  Post vital signs: Reviewed and stable  Last Vitals:  Vitals Value Taken Time  BP 151/81 07/14/20 1216  Temp    Pulse 100 07/14/20 1217  Resp 12 07/14/20 1217  SpO2 100 % 07/14/20 1217  Vitals shown include unvalidated device data.  Last Pain:  Vitals:   07/14/20 1101  TempSrc: Oral  PainSc: 6       Patients Stated Pain Goal: 8 (19/91/44 [id number])  Complications: No complications documented.

## 2020-07-14 NOTE — ED Notes (Signed)
MD at bedside. 

## 2020-07-14 NOTE — Anesthesia Procedure Notes (Signed)
Procedure Name: Intubation Date/Time: 07/14/2020 11:26 AM Performed by: Karna Dupes, CRNA Pre-anesthesia Checklist: Patient identified, Emergency Drugs available, Suction available and Patient being monitored Patient Re-evaluated:Patient Re-evaluated prior to induction Oxygen Delivery Method: Circle system utilized Preoxygenation: Pre-oxygenation with 100% oxygen Induction Type: IV induction, Rapid sequence and Cricoid Pressure applied Laryngoscope Size: Mac and 3 Grade View: Grade I Tube type: Oral Tube size: 7.0 mm Number of attempts: 1 Airway Equipment and Method: Stylet Placement Confirmation: ETT inserted through vocal cords under direct vision,  positive ETCO2 and breath sounds checked- equal and bilateral Secured at: 21 cm Tube secured with: Tape Dental Injury: Teeth and Oropharynx as per pre-operative assessment

## 2020-07-14 NOTE — Progress Notes (Signed)
[facility]  Updated sister surgery completed. Can go home if feeling up to it. Rx sent to [facility]. Post op phone call in a few weeks.   [name], MD [facility] [address] [place], [place] [address] [phone] (office)

## 2020-07-14 NOTE — ED Notes (Signed)
Pt to OR at this time.

## 2020-07-17 LAB — SURGICAL PATHOLOGY

## 2020-07-19 ENCOUNTER — Encounter (HOSPITAL_COMMUNITY): Payer: Self-pay | Admitting: General Surgery

## 2020-07-19 DIAGNOSIS — R6520 Severe sepsis without septic shock: Secondary | ICD-10-CM | POA: Diagnosis not present

## 2020-07-19 DIAGNOSIS — N10 Acute tubulo-interstitial nephritis: Secondary | ICD-10-CM | POA: Diagnosis not present

## 2020-07-19 DIAGNOSIS — I5032 Chronic diastolic (congestive) heart failure: Secondary | ICD-10-CM | POA: Diagnosis not present

## 2020-07-19 DIAGNOSIS — G40909 Epilepsy, unspecified, not intractable, without status epilepticus: Secondary | ICD-10-CM | POA: Diagnosis not present

## 2020-07-19 DIAGNOSIS — N151 Renal and perinephric abscess: Secondary | ICD-10-CM | POA: Diagnosis not present

## 2020-07-19 DIAGNOSIS — A4151 Sepsis due to Escherichia coli [E. coli]: Secondary | ICD-10-CM | POA: Diagnosis not present

## 2020-07-26 DIAGNOSIS — A4151 Sepsis due to Escherichia coli [E. coli]: Secondary | ICD-10-CM | POA: Diagnosis not present

## 2020-07-26 DIAGNOSIS — G40909 Epilepsy, unspecified, not intractable, without status epilepticus: Secondary | ICD-10-CM | POA: Diagnosis not present

## 2020-07-26 DIAGNOSIS — R6520 Severe sepsis without septic shock: Secondary | ICD-10-CM | POA: Diagnosis not present

## 2020-07-26 DIAGNOSIS — N10 Acute tubulo-interstitial nephritis: Secondary | ICD-10-CM | POA: Diagnosis not present

## 2020-07-26 DIAGNOSIS — I5032 Chronic diastolic (congestive) heart failure: Secondary | ICD-10-CM | POA: Diagnosis not present

## 2020-07-26 DIAGNOSIS — N151 Renal and perinephric abscess: Secondary | ICD-10-CM | POA: Diagnosis not present

## 2020-07-31 ENCOUNTER — Other Ambulatory Visit: Payer: Self-pay | Admitting: Neurology

## 2020-07-31 DIAGNOSIS — R569 Unspecified convulsions: Secondary | ICD-10-CM

## 2020-07-31 MED ORDER — LACOSAMIDE 200 MG PO TABS: 200.0000 mg | ORAL_TABLET | Freq: Two times a day (BID) | ORAL | 1 refills | Status: DC

## 2020-07-31 NOTE — Telephone Encounter (Signed)
Pt's mother has called for a refill for pt's lacosamide (VIMPAT) 200 MG TABS tablet to [facility]

## 2020-07-31 NOTE — Addendum Note (Signed)
Addended by: Minna Antis on: 07/31/2020 03:08 PM   Modules accepted: Orders

## 2020-08-01 ENCOUNTER — Ambulatory Visit (INDEPENDENT_AMBULATORY_CARE_PROVIDER_SITE_OTHER): Payer: Medicare Other | Admitting: General Surgery

## 2020-08-01 DIAGNOSIS — K353 Acute appendicitis with localized peritonitis, without perforation or gangrene: Secondary | ICD-10-CM

## 2020-08-01 NOTE — Progress Notes (Signed)
[facility]  I am calling the patient for post operative evaluation. This is not a billable encounter as it is under the [place] charges for the surgery.  The patient had a laparoscopic appendectomy on 07/14/20. The patient's mother and the patient reports that patient is doing well and having no issues. The are tolerating a diet, having good pain control, and having regular Bms.  The incisions are healing well. The patient and her family has no concerns.   Pathology: FINAL MICROSCOPIC DIAGNOSIS:   A. APPENDIX, APPENDECTOMY:  - Acute appendicitis with serositis and periappendiceal inflammation  Will see the patient PRN. Activity and diet as tolerated.   [name], MD [facility] [address] [place], [place] [address] [phone] (office)

## 2020-08-04 ENCOUNTER — Ambulatory Visit: Payer: Medicare Other | Admitting: Physical Medicine and Rehabilitation

## 2020-08-14 NOTE — Progress Notes (Signed)
History of Present Illness: Maria [name] is a 60 y.o. year old female here fo rfollowup of renal abscess--s/p hospitalization.  Her first follow-up was in early January in [facility] urology office in [place].  She had follow-up renal ultrasound on January 24.  This revealed:  Negative ultrasound appearance of the kidneys. No perinephric fluid collections are identified today.  She was admitted to the hospital in February with acute appendicitis.  Urinalysis at that time was clear.  CT abdomen and pelvis with contrast revealed no significant renal abscess or perinephric edema.  No hydronephrosis.  Since her hospitalization for appendicitis she has been doing well from a standpoint of her urine.  There is no dysuria or gross hematuria.    Past Medical History:  Diagnosis Date  . Constipation   . Dysphagia   . Fracture    R foot  . GERD (gastroesophageal reflux disease)   . History of shingles 10/2016  . Mental retardation    lesion in head  . Seizures ([place])    "not fully controlled on max doses of meds" (Neuro ofc note 12/2014)  . Thrombocytopenia ([place])   . Tremor     Past Surgical History:  Procedure Laterality Date  . BIOPSY  09/16/2014   Procedure: BIOPSY;  Surgeon: Rogene Houston, MD;  Location: [facility];  Service: Endoscopy;;  . CATARACT EXTRACTION     both eyes, May of 2015  . COLONOSCOPY    . COLONOSCOPY WITH PROPOFOL N/A 11/20/2018   Procedure: COLONOSCOPY WITH PROPOFOL;  Surgeon: Rogene Houston, MD;  [facility];  Service: Endoscopy;  Laterality: N/A;  . ESOPHAGEAL DILATION N/A 09/16/2014   Procedure: ESOPHAGEAL DILATION WITH 54FR MALONEY DILATOR;  Surgeon: Rogene Houston, MD;  Location: [facility];  Service: Endoscopy;  Laterality: N/A;  . ESOPHAGOGASTRODUODENOSCOPY (EGD) WITH PROPOFOL N/A 09/16/2014   Procedure: ESOPHAGOGASTRODUODENOSCOPY (EGD) WITH PROPOFOL;  Surgeon: Rogene Houston, MD;  Location: [facility];  Service: Endoscopy;  Laterality: N/A;  .  LAPAROSCOPIC APPENDECTOMY N/A 07/14/2020   Procedure: APPENDECTOMY LAPAROSCOPIC;  Surgeon: Virl Cagey, MD;  Location: [facility];  Service: General;  Laterality: N/A;  . MOUTH SURGERY    . POLYPECTOMY  11/20/2018   Procedure: POLYPECTOMY;  Surgeon: Rogene Houston, MD;  [facility];  Service: Endoscopy;;  colon  . Skin graft to gum Right 08/2013  . TONSILLECTOMY AND ADENOIDECTOMY    . TOTAL ABDOMINAL HYSTERECTOMY      Home Medications:  (Not in a hospital admission)   Allergies:  Allergies  Allergen Reactions  . Codeine Nausea And Vomiting  . Lamictal [Lamotrigine] Rash    Family History  Problem Relation Age of Onset  . High Cholesterol Mother   . High blood pressure Mother   . Diabetes Father     Social History:  reports that she quit smoking about 14 years ago. Her smoking use included cigarettes. She has a 22.50 pack-year smoking history. She has never used smokeless tobacco. She reports that she does not drink alcohol and does not use drugs.  ROS: A complete review of systems was performed.  All systems are negative except for pertinent findings as noted.  Physical Exam:  Vital signs in last 24 hours: @VSRANGES @ General:  Alert and oriented, No acute distress HEENT: Normocephalic, atraumatic Neck: No JVD or lymphadenopathy Cardiovascular: Regular rate  Lungs: Normal inspiratory/expiratory excursion Extremities: No edema Neurologic: Grossly intact  I have reviewed prior pt notes  I have reviewed notes  from referring/previous physicians  I have reviewed urinalysis results  I have independently reviewed prior imaging-both renal ultrasound images and CT images were reviewed with the patient and her mom    Impression/Assessment:  History of left renal abscess with pyelonephritis, resolved  Plan:  I will have her come back on an as-needed basis  She has been on oxybutynin long-term-5 mg at night.  I gave her my permission to try to wean her off  of this  Jorja Loa 08/14/2020, 9:20 PM  Lillette Boxer. Jayson Waterhouse MD

## 2020-08-15 ENCOUNTER — Ambulatory Visit (INDEPENDENT_AMBULATORY_CARE_PROVIDER_SITE_OTHER): Payer: Medicare Other | Admitting: Urology

## 2020-08-15 ENCOUNTER — Encounter: Payer: Self-pay | Admitting: Urology

## 2020-08-15 ENCOUNTER — Other Ambulatory Visit: Payer: Self-pay

## 2020-08-15 VITALS — BP 115/68 | HR 79 | Temp 97.9°F | Ht 66.0 in | Wt 132.0 lb

## 2020-08-15 DIAGNOSIS — N12 Tubulo-interstitial nephritis, not specified as acute or chronic: Secondary | ICD-10-CM

## 2020-08-15 NOTE — Progress Notes (Signed)
Urological Symptom Review  Patient is experiencing the following symptoms: Hard to postpone urination Get up at night to urinate Leakage of urine Stream starts and stops   Review of Systems  Gastrointestinal (upper)  : Negative for upper GI symptoms  Gastrointestinal (lower) : Negative for lower GI symptoms  Constitutional : Negative for symptoms  Skin: Negative for skin symptoms  Eyes: Negative for eye symptoms  Ear/Nose/Throat : Negative for Ear/Nose/Throat symptoms  Hematologic/Lymphatic: Easy bruising  Cardiovascular : Negative for cardiovascular symptoms  Respiratory : Negative for respiratory symptoms  Endocrine: Negative for endocrine symptoms  Musculoskeletal: Negative for musculoskeletal symptoms  Neurological: Negative for neurological symptoms  Psychologic: Negative for psychiatric symptoms

## 2020-08-16 ENCOUNTER — Encounter: Payer: Self-pay | Admitting: Physical Medicine and Rehabilitation

## 2020-08-16 ENCOUNTER — Encounter: Payer: Medicare Other | Attending: Registered Nurse | Admitting: Physical Medicine and Rehabilitation

## 2020-08-16 VITALS — BP 116/75 | HR 59 | Ht 66.0 in | Wt 132.0 lb

## 2020-08-16 DIAGNOSIS — Z7409 Other reduced mobility: Secondary | ICD-10-CM

## 2020-08-16 DIAGNOSIS — R000 Tachycardia, unspecified: Secondary | ICD-10-CM | POA: Diagnosis not present

## 2020-08-16 NOTE — Progress Notes (Signed)
Subjective:    Patient ID: Maria [name], female    DOB: [birth year], 60 y.o.   MRN: [id number]  HPI: Maria [name] is a 60 y.o. female who is here for follow-up appointment of her Debility, ESB  ( extended spectrum beta-lactamase) producing bacteria infection and Seizure disorder. Maria [name] presented to [facility] emergency room on 04/14/2020 via EMS with chief complaint of left lower quadrant pain x 3 weeks.  CT Abdomen Pelvis W Contrast:  IMPRESSION: Findings suspicious for pyelonephritis and small abscess in lower pole of left kidney. Suggest correlation with urinalysis, and recommend continued follow-up by CT to confirm resolution.  No evidence of ureteral calculi or hydronephrosis.  Aortic Atherosclerosis (ICD10-I70.0). US Renal:  IMPRESSION: 1. 2.5 cm structure along the lower pole of the left kidney suspicious for a small abscess given the ultrasound and CT appearance. 2. Unremarkable appearance of the right kidney.  Her hospitalization course was significant for breakthrough seizures with delirium and debility/   Maria [name] had a prolonged hospitalization stay, see discharge summaries for details.  See Dr. Waldron Labs, Reesa Chew Pa-C,  And Dr Mel Almond, .  She was admitted to inpatient rehabilitation on 05/05/2020 and discharged to acute care for sepsis secondary to UTI and Pyelonephritis of left kidney on 05/12/2020.   Maria [name] was admitted to inpatient rehabilitation on 05/18/2020 and discharged home on 06/01/2020. She is receiving home health therapy with [facility]. She denies any pain. She rated her pain 0. Also reports, she has a good appetite.   Maria [name] mother in room, all questions answered.   1) Tachycardia:  -HR is very well controlled- 59 -currently she takes lopressor 25mg  BID.  2) Impaired mobility and ADLs: -her mom says she stopped exercising after PT finished.  -10 minute per walk per day.  [place]  varmints that live in the ground.   3) Anxiety: -denies anxiety -stopped Xanax.    4) General health: denies pain, anxiety. She plays Parchese at night.     Pain Inventory Average Pain 0 Pain Right Now 0 My pain is no pain  In the last 24 hours, has pain interfered with the following? General activity 0 Relation with others 0 Enjoyment of life 0 What TIME of day is your pain at its worst? No pain  Sleep (in general) Fair  Pain is worse with: no pain Pain improves with: no pain Relief from Meds: no pain  Family History  Problem Relation Age of Onset  . High Cholesterol Mother   . High blood pressure Mother   . Diabetes Father    Social History   Socioeconomic History  . Marital status: Single    Spouse name: Not on file  . Number of children: 0  . Years of education: 10  . Highest education level: Not on file  Occupational History    Employer: UNEMPLOYED  Tobacco Use  . Smoking status: Former Smoker    Packs/day: 1.50    Years: 15.00    Pack years: 22.50    Types: Cigarettes    Quit date: 05/22/2006    Years since quitting: 14.2  . Smokeless tobacco: Never Used  Vaping Use  . Vaping Use: Never used  Substance and Sexual Activity  . Alcohol use: No    Alcohol/week: 0.0 standard drinks  . Drug use: No  . Sexual activity: Never    Birth control/protection: Other-see comments    Comment: Hysterectomy  Other  Topics Concern  . Not on file  Social History Narrative   Patient is single and lives with her mother Maria [name])      Patient drinks 3 cups of caffeine daily.   Patient is right handed.         Social Determinants of Health   Financial Resource Strain: Not on file  Food Insecurity: Not on file  Transportation Needs: Not on file  Physical Activity: Not on file  Stress: Not on file  Social Connections: Not on file   Past Surgical History:  Procedure Laterality Date  . BIOPSY  09/16/2014   Procedure: BIOPSY;  Surgeon: Rogene Houston, MD;   Location: [facility];  Service: Endoscopy;;  . CATARACT EXTRACTION     both eyes, May of 2015  . COLONOSCOPY    . COLONOSCOPY WITH PROPOFOL N/A 11/20/2018   Procedure: COLONOSCOPY WITH PROPOFOL;  Surgeon: Rogene Houston, MD;  Location: [facility];  Service: Endoscopy;  Laterality: N/A;  . ESOPHAGEAL DILATION N/A 09/16/2014   Procedure: ESOPHAGEAL DILATION WITH 54FR MALONEY DILATOR;  Surgeon: Rogene Houston, MD;  Location: [facility];  Service: Endoscopy;  Laterality: N/A;  . ESOPHAGOGASTRODUODENOSCOPY (EGD) WITH PROPOFOL N/A 09/16/2014   Procedure: ESOPHAGOGASTRODUODENOSCOPY (EGD) WITH PROPOFOL;  Surgeon: Rogene Houston, MD;  Location: [facility];  Service: Endoscopy;  Laterality: N/A;  . LAPAROSCOPIC APPENDECTOMY N/A 07/14/2020   Procedure: APPENDECTOMY LAPAROSCOPIC;  Surgeon: Virl Cagey, MD;  Location: [facility];  Service: General;  Laterality: N/A;  . MOUTH SURGERY    . POLYPECTOMY  11/20/2018   Procedure: POLYPECTOMY;  Surgeon: Rogene Houston, MD;  Location: [facility];  Service: Endoscopy;;  colon  . Skin graft to gum Right 08/2013  . TONSILLECTOMY AND ADENOIDECTOMY    . TOTAL ABDOMINAL HYSTERECTOMY     Past Surgical History:  Procedure Laterality Date  . BIOPSY  09/16/2014   Procedure: BIOPSY;  Surgeon: Rogene Houston, MD;  Location: [facility];  Service: Endoscopy;;  . CATARACT EXTRACTION     both eyes, May of 2015  . COLONOSCOPY    . COLONOSCOPY WITH PROPOFOL N/A 11/20/2018   Procedure: COLONOSCOPY WITH PROPOFOL;  Surgeon: Rogene Houston, MD;  [facility]: [facility];  Service: Endoscopy;  Laterality: N/A;  . ESOPHAGEAL DILATION N/A 09/16/2014   Procedure: ESOPHAGEAL DILATION WITH 54FR MALONEY DILATOR;  Surgeon: Rogene Houston, MD;  Location: [facility];  Service: Endoscopy;  Laterality: N/A;  . ESOPHAGOGASTRODUODENOSCOPY (EGD) WITH PROPOFOL N/A 09/16/2014   Procedure: ESOPHAGOGASTRODUODENOSCOPY (EGD) WITH PROPOFOL;  Surgeon: Rogene Houston, MD;  Location: [facility];  Service: Endoscopy;   Laterality: N/A;  . LAPAROSCOPIC APPENDECTOMY N/A 07/14/2020   Procedure: APPENDECTOMY LAPAROSCOPIC;  Surgeon: Virl Cagey, MD;  Location: [facility];  Service: General;  Laterality: N/A;  . MOUTH SURGERY    . POLYPECTOMY  11/20/2018   Procedure: POLYPECTOMY;  Surgeon: Rogene Houston, MD;  [facility];  Service: Endoscopy;;  colon  . Skin graft to gum Right 08/2013  . TONSILLECTOMY AND ADENOIDECTOMY    . TOTAL ABDOMINAL HYSTERECTOMY     Past Medical History:  Diagnosis Date  . Constipation   . Dysphagia   . Fracture    R foot  . GERD (gastroesophageal reflux disease)   . History of shingles 10/2016  . Mental retardation    lesion in head  . Seizures ([place])    "not fully controlled on max doses of meds" (Neuro ofc note 12/2014)  .  Thrombocytopenia ([place])   . Tremor    There were no vitals taken for this visit.  Opioid Risk Score:   Fall Risk Score:  `1  Depression screen PHQ 2/9  No flowsheet data found.  Review of Systems  Constitutional: Negative.   HENT: Negative.   Eyes: Negative.   Respiratory: Negative.   Cardiovascular: Negative.   Gastrointestinal: Negative.   Endocrine: Negative.   Genitourinary: Negative.   Musculoskeletal: Negative.   Skin: Negative.   Allergic/Immunologic: Negative.   Neurological: Negative.   Hematological: Negative.   Psychiatric/Behavioral: Negative.   All other systems reviewed and are negative.      Objective:    Physical Exam Gen: no distress, normal appearing HEENT: oral mucosa pink and moist, NCAT Cardio: Reg rate Chest: normal effort, normal rate of breathing Abd: soft, non-distended Ext: no edema Psych: pleasant, normal affect Skin: intact Neuro:    Comments: Normal Muscle Bulk and Muscle Testing Reveals:  Upper Extremities: Full ROM and Muscle Strength 5/5  Lower Extremities: Full ROM and Muscle Strength 5/5 Arises from Table slowly Narrow Based  Gait       Assessment & Plan:   1.Debility:  Continue HEP.  ,2.ESB  ( extended spectrum beta-lactamase) producing bacteria infection: IV Antibiotics completed. ID Following.  She has an appointment with Urology. Continue to monitor.  3. Seizure disorder.Continue current medication regimen. PCP following. Continue to monitor.  4. Impaired mobility: encouraged daily 10 minute walk. 5. Dehydration: encouraged 6-8 glasses of water per day. 6. Tachycardia: decreased Lopressor to 25mg  to daily, then stop after 3 days.

## 2020-09-13 ENCOUNTER — Ambulatory Visit: Payer: Medicare Other | Admitting: Cardiovascular Disease

## 2020-10-02 ENCOUNTER — Other Ambulatory Visit: Payer: Self-pay | Admitting: Neurology

## 2020-10-02 NOTE — Telephone Encounter (Signed)
Pt's mother is asking that Dr Jannifer Franklin fills pt's clonazePAM (KLONOPIN) 0.5 MG tablet, she is asking that 1 month amount be sent to [facility] in [place] and that a prescription be sent to [facility].  Pt's mother states she told the hospital to leave the prescription under the name of Dr Jannifer Franklin and that is who she wants to fill it now.

## 2020-10-03 MED ORDER — CLONAZEPAM 0.5 MG PO TABS: 0.5000 mg | ORAL_TABLET | Freq: Two times a day (BID) | ORAL | 0 refills | Status: DC

## 2020-10-03 NOTE — Addendum Note (Signed)
Addended by: Brandon Melnick on: 10/03/2020 09:23 AM   Modules accepted: Orders

## 2020-10-10 ENCOUNTER — Emergency Department (HOSPITAL_COMMUNITY)
Admission: EM | Admit: 2020-10-10 | Discharge: 2020-10-10 | Disposition: A | Discharge status: Home / Self Care | Payer: Medicare Other | Attending: Emergency Medicine | Admitting: Emergency Medicine

## 2020-10-10 ENCOUNTER — Emergency Department (HOSPITAL_COMMUNITY): Payer: Medicare Other

## 2020-10-10 ENCOUNTER — Other Ambulatory Visit: Payer: Self-pay

## 2020-10-10 ENCOUNTER — Encounter (HOSPITAL_COMMUNITY): Payer: Self-pay | Admitting: Emergency Medicine

## 2020-10-10 DIAGNOSIS — G319 Degenerative disease of nervous system, unspecified: Secondary | ICD-10-CM | POA: Diagnosis not present

## 2020-10-10 DIAGNOSIS — M25562 Pain in left knee: Secondary | ICD-10-CM | POA: Diagnosis not present

## 2020-10-10 DIAGNOSIS — M25522 Pain in left elbow: Secondary | ICD-10-CM | POA: Diagnosis not present

## 2020-10-10 DIAGNOSIS — W1839XA Other fall on same level, initial encounter: Secondary | ICD-10-CM | POA: Diagnosis not present

## 2020-10-10 DIAGNOSIS — Z87891 Personal history of nicotine dependence: Secondary | ICD-10-CM | POA: Insufficient documentation

## 2020-10-10 DIAGNOSIS — R739 Hyperglycemia, unspecified: Secondary | ICD-10-CM | POA: Diagnosis not present

## 2020-10-10 DIAGNOSIS — R531 Weakness: Secondary | ICD-10-CM | POA: Diagnosis not present

## 2020-10-10 DIAGNOSIS — M545 Low back pain, unspecified: Secondary | ICD-10-CM | POA: Diagnosis not present

## 2020-10-10 DIAGNOSIS — R9431 Abnormal electrocardiogram [ECG] [EKG]: Secondary | ICD-10-CM | POA: Diagnosis not present

## 2020-10-10 DIAGNOSIS — S0990XA Unspecified injury of head, initial encounter: Secondary | ICD-10-CM | POA: Diagnosis not present

## 2020-10-10 DIAGNOSIS — G9389 Other specified disorders of brain: Secondary | ICD-10-CM | POA: Diagnosis not present

## 2020-10-10 DIAGNOSIS — W19XXXA Unspecified fall, initial encounter: Secondary | ICD-10-CM

## 2020-10-10 LAB — CBC WITH DIFFERENTIAL/PLATELET
Abs Immature Granulocytes: 0.05 10*3/uL (ref 0.00–0.07)
Basophils Absolute: 0 10*3/uL (ref 0.0–0.1)
Basophils Relative: 1 %
Eosinophils Absolute: 0.2 10*3/uL (ref 0.0–0.5)
Eosinophils Relative: 3 %
HCT: 38.6 % (ref 36.0–46.0)
Hemoglobin: 12.3 g/dL (ref 12.0–15.0)
Immature Granulocytes: 1 %
Lymphocytes Relative: 49 %
Lymphs Abs: 3.3 10*3/uL (ref 0.7–4.0)
MCH: 30.2 pg (ref 26.0–34.0)
MCHC: 31.9 g/dL (ref 30.0–36.0)
MCV: 94.8 fL (ref 80.0–100.0)
Monocytes Absolute: 0.6 10*3/uL (ref 0.1–1.0)
Monocytes Relative: 9 %
Neutro Abs: 2.5 10*3/uL (ref 1.7–7.7)
Neutrophils Relative %: 37 %
Platelets: 150 10*3/uL (ref 150–400)
RBC: 4.07 MIL/uL (ref 3.87–5.11)
RDW: 14.7 % (ref 11.5–15.5)
WBC: 6.6 10*3/uL (ref 4.0–10.5)
nRBC: 0 % (ref 0.0–0.2)

## 2020-10-10 LAB — URINALYSIS, ROUTINE W REFLEX MICROSCOPIC
Bilirubin Urine: NEGATIVE
Glucose, UA: NEGATIVE mg/dL
Hgb urine dipstick: NEGATIVE
Ketones, ur: NEGATIVE mg/dL
Leukocytes,Ua: NEGATIVE
Nitrite: NEGATIVE
Protein, ur: NEGATIVE mg/dL
Specific Gravity, Urine: 1.01 (ref 1.005–1.030)
pH: 7 (ref 5.0–8.0)

## 2020-10-10 LAB — COMPREHENSIVE METABOLIC PANEL
ALT: 19 U/L (ref 0–44)
AST: 18 U/L (ref 15–41)
Albumin: 3.8 g/dL (ref 3.5–5.0)
Alkaline Phosphatase: 67 U/L (ref 38–126)
Anion gap: 8 (ref 5–15)
BUN: 21 mg/dL — ABNORMAL HIGH (ref 6–20)
CO2: 26 mmol/L (ref 22–32)
Calcium: 9.5 mg/dL (ref 8.9–10.3)
Chloride: 105 mmol/L (ref 98–111)
Creatinine, Ser: 0.83 mg/dL (ref 0.44–1.00)
GFR, Estimated: >60 mL/min (ref >60)
Glucose, Bld: 102 mg/dL — ABNORMAL HIGH (ref 70–99)
Potassium: 3.8 mmol/L (ref 3.5–5.1)
Sodium: 139 mmol/L (ref 135–145)
Total Bilirubin: 0.6 mg/dL (ref 0.3–1.2)
Total Protein: 6.5 g/dL (ref 6.5–8.1)

## 2020-10-10 MED ORDER — LORAZEPAM 2 MG/ML IJ SOLN
1.0000 mg | Freq: Once | INTRAMUSCULAR | Status: AC
  Administered 2020-10-10: 1 mg via INTRAVENOUS
  Filled 2020-10-10: qty 1

## 2020-10-10 MED ORDER — SODIUM CHLORIDE 0.9 % IV BOLUS
1000.0000 mL | Freq: Once | INTRAVENOUS | Status: AC
  Administered 2020-10-10: 1000 mL via INTRAVENOUS

## 2020-10-10 NOTE — ED Provider Notes (Signed)
[facility] EMERGENCY DEPARTMENT Provider Note   CSN: [id number] Arrival date & time: 10/10/20  1225     History Chief Complaint  Patient presents with  . Fall    Maria [name] is a 60 y.o. female.  HPI    Patient with significant medical history of mental delay, GERD, seizures, thrombocytopenia presents to the emergency department with chief complaint of falls.  Patient endorses that she has been falling over the last couple days, states that she fell last night and this morning, states she fell onto her left side, hit her knee and her left elbow but denies hitting her head or losing conscious, is not on anticoagulant.  Patient states she fell because her legs gave out, she states her legs normally give out and she normally falls multiple times.  She denies headaches, change in vision, paresthesias or weakness in the upper or lower extremities.  She states that she needed assistance up off the floor after she fell but when she was up she was able to walk around, does have left knee pain with ambulation,  denies paresthesia or weakness in that leg, she also notices slight lower back pain, she attributes this to her bed.  She denies any alleviating factors.  Patient denies headaches, fevers, chills, shortness of breath, chest pain, abdominal pain, nausea, vomit, diarrhea.  Patient's mother is now at bedside and she endorses that over the last 3 days patient's had 3 falls which is atypical for her, she endorses that she fell twice this morning, first time was around 5 AM she went to grab some water and fell onto her left side but was able to get herself up without difficulty.  She then fell again around 11 AM this morning she was on her way to restroom and fell, at this point she asked EMS to come and get her up off the ground and bring her here to the emergency department.  She notes that the patient has been peeing more frequently but has not noted any other complaints at this time.  She states  she is acting her normal self and has been taking all of her medications.    Past Medical History:  Diagnosis Date  . Constipation   . Dysphagia   . Fracture    R foot  . GERD (gastroesophageal reflux disease)   . History of shingles 10/2016  . Mental retardation    lesion in head  . Seizures ([place])    "not fully controlled on max doses of meds" (Neuro ofc note 12/2014)  . Thrombocytopenia ([place])   . Tremor     Patient Active Problem List   Diagnosis Date Noted  . Acute appendicitis 07/14/2020  . ESBL (extended spectrum beta-lactamase) producing bacteria infection 05/12/2020  . Bacteremia due to Escherichia coli 05/12/2020  . Severe sepsis ([place]) 05/12/2020  . Debility 05/05/2020  . Pressure injury of skin 04/24/2020  . Palliative care by specialist   . Pyelonephritis of left kidney 04/14/2020  . Renal abscess, left 04/14/2020  . Leukocytosis 04/14/2020  . Fever 04/14/2020  . LLQ abdominal pain 04/14/2020  . Dysphagia   . AKI (acute kidney injury) ([place])   . Sepsis secondary to UTI ([place])   . Pain in left ankle and joints of left foot 12/02/2019  . Positive colorectal cancer screening using Cologuard test 10/28/2018  . Posterior tibial tendinitis, right leg 04/23/2016  . Pain in right foot 04/12/2016  . Acute encephalopathy   . Myoclonus   .  Frequent falls 11/09/2015  . Fall 11/09/2015  . Chest pain 03/16/2014  . Upper abdominal pain 03/16/2014  . Seizure disorder ([place]) 03/16/2014  . 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 Generalized convulsive epilepsy ([place]) 10/06/2012  . DNR (do not resuscitate) discussion 10/06/2012  . Intellectual disability   . Thrombocytopenia ([place]) 05/23/2011  . Seizures ([place]) 03/21/2011  . GERD (gastroesophageal reflux disease) 03/21/2011  . CLOSED FRACTURE OF ACROMIAL END OF CLAVICLE 07/13/2008    Past Surgical History:  Procedure Laterality Date  . BIOPSY  09/16/2014   Procedure: BIOPSY;  Surgeon: Rogene Houston, MD;  Location:  [facility] ORS;  Service: Endoscopy;;  . CATARACT EXTRACTION     both eyes, May of 2015  . COLONOSCOPY    . COLONOSCOPY WITH PROPOFOL N/A 11/20/2018   Procedure: COLONOSCOPY WITH PROPOFOL;  Surgeon: Rogene Houston, MD;  Location: [facility] ENDO SUITE;  Service: Endoscopy;  Laterality: N/A;  . ESOPHAGEAL DILATION N/A 09/16/2014   Procedure: ESOPHAGEAL DILATION WITH 54FR MALONEY DILATOR;  Surgeon: Rogene Houston, MD;  Location: [facility] ORS;  Service: Endoscopy;  Laterality: N/A;  . ESOPHAGOGASTRODUODENOSCOPY (EGD) WITH PROPOFOL N/A 09/16/2014   Procedure: ESOPHAGOGASTRODUODENOSCOPY (EGD) WITH PROPOFOL;  Surgeon: Rogene Houston, MD;  Location: [facility] ORS;  Service: Endoscopy;  Laterality: N/A;  . LAPAROSCOPIC APPENDECTOMY N/A 07/14/2020   Procedure: APPENDECTOMY LAPAROSCOPIC;  Surgeon: Virl Cagey, MD;  Location: [facility] ORS;  Service: General;  Laterality: N/A;  . MOUTH SURGERY    . POLYPECTOMY  11/20/2018   Procedure: POLYPECTOMY;  Surgeon: Rogene Houston, MD;  Location: [facility] SU[facility];  Service: Endoscopy;;  colon  . Skin graft to gum Right 08/2013  . TONSILLECTOMY AND ADENOIDECTOMY    . TOTAL ABDOMINAL HYSTERECTOMY       OB History   No obstetric history on file.     Family History  Problem Relation Age of Onset  . High Cholesterol Mother   . High blood pressure Mother   . Diabetes Father     Social History   Tobacco Use  . Smoking status: Former Smoker    Packs/day: 1.50    Years: 15.00    Pack years: 22.50    Types: Cigarettes    Quit date: 05/22/2006    Years since quitting: 14.3  . Smokeless tobacco: Never Used  Vaping Use  . Vaping Use: Never used  Substance Use Topics  . Alcohol use: No    Alcohol/week: 0.0 standard drinks  . Drug use: No    Home Medications Prior to Admission medications   Medication Sig Start Date End Date Taking? Authorizing Provider  acetaminophen (TYLENOL) 500 MG tablet Take 1 tablet (500 mg total) by mouth every 6 (six) hours as needed for moderate pain.  05/05/20   Elgergawy, Silver Huguenin, MD  atorvastatin (LIPITOR) 20 MG tablet Take 20 mg by mouth daily. 08/24/18   [provider]  benzonatate (TESSALON) 200 MG capsule TAKE 1 CAPSULE (200 MG TOTAL) BY MOUTH THREE TIMES DAILY. 05/31/20 05/31/21  Angiulli, Lavon Paganini, PA-C  clonazePAM (KLONOPIN) 0.5 MG tablet Take 1 tablet (0.5 mg total) by mouth 2 (two) times daily. 10/03/20   Suzzanne Cloud, NP  divalproex (DEPAKOTE) 500 MG DR tablet TAKE 2 TABLETS (1,000 MG TOTAL) BY MOUTH DAILY. Patient taking differently: Take 500 mg by mouth 2 (two) times daily. 05/31/20 05/31/21  Angiulli, Lavon Paganini, PA-C  docusate sodium (COLACE) 100 MG capsule Take 1 capsule (100 mg total) by mouth 2 (two)  times daily. 05/05/20   Elgergawy, Silver Huguenin, MD  feeding supplement (ENSURE ENLIVE / ENSURE PLUS) LIQD Take 237 mLs by mouth 3 (three) times daily between meals. 05/05/20   Elgergawy, Silver Huguenin, MD  Fluticasone-Salmeterol (ADVAIR) 100-50 MCG/DOSE AEPB INHALE 1 PUFF INTO THE LUNGS IN THE MORNING AND AT BEDTIME. 05/31/20 05/31/21  Angiulli, Lavon Paganini, PA-C  lacosamide (VIMPAT) 200 MG TABS tablet Take 1 tablet (200 mg total) by mouth 2 (two) times daily. 07/31/20   Suzzanne Cloud, NP  levocetirizine (XYZAL) 5 MG tablet Take 5 mg by mouth every evening.    [provider]  melatonin 3 MG TABS tablet TAKE 1 TABLET (3 MG TOTAL) BY MOUTH AT BEDTIME AS NEEDED (INSOMINA). 05/31/20 05/31/21  Angiulli, Lavon Paganini, PA-C  metoprolol tartrate (LOPRESSOR) 25 MG tablet TAKE 1 TABLET (25 MG TOTAL) BY MOUTH TWO TIMES DAILY. 05/31/20 05/31/21  Angiulli, Lavon Paganini, PA-C  omeprazole (PRILOSEC) 40 MG capsule Take 1 capsule (40 mg total) by mouth daily. 11/03/19   Laurine Blazer B, PA-C  ondansetron (ZOFRAN) 4 MG tablet Take 1 tablet (4 mg total) by mouth every 8 (eight) hours as needed. 07/14/20 07/14/21  Virl Cagey, MD  oxybutynin (DITROPAN) 5 MG tablet Take 5 mg by mouth at bedtime.    [provider]  Pediatric Multiple Vit-C-FA  (PEDIATRIC MULTIVITAMIN) chewable tablet Chew 1 tablet by mouth daily.    [provider]  QUEtiapine (SEROQUEL) 25 MG tablet Take 1 tablet (25 mg total) by mouth 2 (two) times daily. 05/31/20 06/06/20  Angiulli, Lavon Paganini, PA-C    Allergies    Codeine and Lamictal [lamotrigine]  Review of Systems   Review of Systems  Constitutional: Negative for chills and fever.  HENT: Negative for congestion.   Respiratory: Negative for shortness of breath.   Cardiovascular: Negative for chest pain.  Gastrointestinal: Negative for abdominal pain.  Genitourinary: Negative for enuresis.  Musculoskeletal: Positive for back pain.       Left elbow, left knee, lower back pain  Skin: Negative for rash.  Neurological: Negative for dizziness and headaches.  Hematological: Does not bruise/bleed easily.    Physical Exam Updated Vital Signs BP (!) 120/101   Pulse 77   Temp 97.8 F (36.6 C) (Oral)   Resp 15   Ht 5\' 6"  (1.676 m)   Wt 59.9 kg   SpO2 100%   BMI 21.31 kg/m   Physical Exam Vitals and nursing note reviewed.  Constitutional:      General: She is not in acute distress.    Appearance: She is not ill-appearing.  HENT:     Head: Normocephalic and atraumatic.     Comments: Head was palpated was nontender to palpation, no deformities noted, no raccoon eyes or battle sign noted    Nose: No congestion.  Eyes:     Extraocular Movements: Extraocular movements intact.     Conjunctiva/sclera: Conjunctivae normal.     Pupils: Pupils are equal, round, and reactive to light.  Cardiovascular:     Rate and Rhythm: Normal rate and regular rhythm.     Pulses: Normal pulses.     Heart sounds: No murmur heard. No friction rub. No gallop.   Pulmonary:     Effort: No respiratory distress.     Breath sounds: No wheezing, rhonchi or rales.  Abdominal:     Palpations: Abdomen is soft.     Tenderness: There is no abdominal tenderness.  Musculoskeletal:     Right lower leg:  No edema.     Left  lower leg: No edema.     Comments: Spine was palpated he was slightly tender to palpation in her lower lumbar region, no step-off or deformities present, patient is moving all 4 extremities, she has 5 / 5 strength, neurovascular intact in the upper lower extremities.  Patient's left elbow and left knee were tender to palpation, there is no gross deformities noted, she had full range of motion both joints, neurovascularly intact  Skin:    General: Skin is warm and dry.  Neurological:     Mental Status: She is alert.     GCS: GCS eye subscore is 4. GCS verbal subscore is 5. GCS motor subscore is 6.     Motor: No weakness.     Coordination: Romberg sign negative.     Comments: Cranial nerves II through XII are grossly intact  Patient have no difficulty with word finding.  Psychiatric:        Mood and Affect: Mood normal.     ED Results / Procedures / Treatments   Labs (all labs ordered are listed, but only abnormal results are displayed) Labs Reviewed  COMPREHENSIVE METABOLIC PANEL - Abnormal; Notable for the following components:      Result Value   Glucose, Bld 102 (*)    BUN 21 (*)    All other components within normal limits  URINALYSIS, ROUTINE W REFLEX MICROSCOPIC - Abnormal; Notable for the following components:   Color, Urine STRAW (*)    All other components within normal limits  CBC WITH DIFFERENTIAL/PLATELET    EKG EKG Interpretation  Date/Time:  Tuesday Oct 10 2020 14:43:51 EDT Ventricular Rate:  64 PR Interval:  127 QRS Duration: 133 QT Interval:  438 QTC Calculation: 452 R Axis:   80 Text Interpretation: Sinus rhythm Right bundle branch block No significant change since last tracing Confirmed by Isla Pence [phone]) on 10/10/2020 2:52:14 PM   Radiology DG Elbow Complete Left  Result Date: 10/10/2020 CLINICAL DATA:  Left elbow pain. Status post fall x2 today. Initial encounter. EXAM: LEFT ELBOW - COMPLETE 3+ VIEW COMPARISON:  None. FINDINGS: There is no  evidence of fracture, dislocation, or joint effusion. There is no evidence of arthropathy or other focal bone abnormality. Soft tissues are unremarkable. IMPRESSION: Negative exam. Electronically Signed   By: Inge Rise M.D.   On: 10/10/2020 13:58   CT Head Wo Contrast  Result Date: 10/10/2020 CLINICAL DATA:  Head trauma, moderate to severe. Patient fell twice today. EXAM: CT HEAD WITHOUT CONTRAST TECHNIQUE: Contiguous axial images were obtained from the base of the skull through the vertex without intravenous contrast. COMPARISON:  None. FINDINGS: Brain: There is moderate central and cortical atrophy. Significant cerebellar atrophy. Stable encephalomalacia of the inferior RIGHT frontal lobe. There is no intra or extra-axial fluid collection or mass lesion. The basilar cisterns and ventricles have a normal appearance. There is no CT evidence for acute infarction or hemorrhage. Vascular: No significant atherosclerotic calcification of the internal carotid arteries. No hyperdense vessels. Skull: Prominent venous [place] in the RIGHT frontal bone is chronic. No acute abnormality. Sinuses/Orbits: No acute finding. Other: None IMPRESSION: 1. Cerebral and cerebellar atrophy. 2. 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 3.  No evidence for acute  abnormality. Electronically Signed   By: Nolon Nations M.D.   On: 10/10/2020 14:50   CT Lumbar Spine Wo Contrast  Result Date: 10/10/2020 CLINICAL DATA:  Low back pain, trauma. Additional provided: Patient reports  falling twice today. Leg weakness. Bilateral ankle pain. Left elbow pain. EXAM: CT LUMBAR SPINE WITHOUT CONTRAST TECHNIQUE: Multidetector CT imaging of the lumbar spine was performed without intravenous contrast administration. Multiplanar CT image reconstructions were also generated. COMPARISON:  Radiographs of the lumbar spine 09/04/2017. FINDINGS: Segmentation: 5 lumbar vertebrae. The caudal most well-formed intervertebral disc space is  designated L5-S1. Alignment: Lumbar levocurvature. Trace L2-L3 grade 1 retrolisthesis. Vertebrae: Vertebral body height is maintained. No evidence of acute fracture to the lumbar spine. Paraspinal and other soft tissues: Paraspinal soft tissues within normal limits. Aortoiliac atherosclerosis. Disc levels: No more than mild disc space narrowing at any level. L1-L2: Minimal facet arthrosis. No appreciable significant disc herniation or spinal canal stenosis. No significant bony neural foraminal narrowing. L2-L3: Trace retrolisthesis. Small disc bulge. Mild facet arthrosis. No appreciable significant spinal canal stenosis. No significant bony neural foraminal narrowing. L3-L4: Small disc bulge. Facet arthrosis. No appreciable significant spinal canal stenosis. No significant bony neural foraminal narrowing. L4-L5: Disc bulge. Facet arthrosis/ligamentum flavum hypertrophy. Apparent mild bilateral subarticular and central canal stenosis. No significant bony neural foraminal narrowing. L5-S1: Small disc bulge. Facet arthrosis (advanced on the right). No appreciable significant spinal canal stenosis. Mild/moderate right neural foraminal narrowing. IMPRESSION: No evidence of acute fracture to the lumbar spine. Lumbar levocurvature. Trace L2-L3 grade 1 retrolisthesis. Lumbar spondylosis as outlined. No more than mild appreciable spinal canal stenosis. Advanced facet arthrosis contributes to multifactorial mild/moderate right neural foraminal narrowing at L5-S1. Aortic Atherosclerosis (ICD10-I70.0). Electronically Signed   By: Kellie Simmering DO   On: 10/10/2020 14:47   MR BRAIN WO CONTRAST  Result Date: 10/10/2020 CLINICAL DATA:  Weakness, fall EXAM: MRI HEAD WITHOUT CONTRAST TECHNIQUE: Multiplanar, multiecho pulse sequences of the brain and surrounding structures were obtained without intravenous contrast. COMPARISON:  2014 FINDINGS: Motion artifact is present. Brain: Right anterior inferior frontal lobe encephalomalacia.  Prominence of the ventricles and sulci reflects generalized parenchymal volume loss. There is disproportionate cerebellar volume loss. There is no acute infarction or intracranial hemorrhage. There is no intracranial mass, mass effect, or edema. There is no hydrocephalus or extra-axial fluid collection. Vascular: Major vessel flow voids at the skull base are preserved. Skull and upper cervical spine: Increase in size of a T2 hyperintense lesion of the right frontal calvarium near the coronal suture. Remains no aggressive features on this study or CT and this is benign. Marrow signal is otherwise within normal limits. Sinuses/Orbits: Paranasal sinuses are aerated. Bilateral lens replacements. Other: Sella is unremarkable. Patchy left mastoid fluid opacification. IMPRESSION: No evidence of recent infarction, hemorrhage, or mass. Chronic/nonemergent findings detailed above. Electronically Signed   By: Macy Mis M.D.   On: 10/10/2020 16:55   DG Knee Complete 4 Views Left  Result Date: 10/10/2020 CLINICAL DATA:  Left knee pain. Status post fall x2. Initial encounter. EXAM: LEFT KNEE - COMPLETE 4+ VIEW COMPARISON:  None. FINDINGS: There is no acute bony or joint abnormality. Small ossification along the medial epicondyle of the femur is consistent with remote MCL injury. Joint spaces are preserved. No joint effusion or chondrocalcinosis. IMPRESSION: No acute abnormality. Electronically Signed   By: Inge Rise M.D.   On: 10/10/2020 13:57    Procedures Procedures   Medications Ordered in ED Medications  LORazepam (ATIVAN) injection 1 mg (1 mg Intravenous Given 10/10/20 1601)  sodium chloride 0.9 % bolus 1,000 mL (1,000 mLs Intravenous New Bag/Given 10/10/20 1558)    ED Course  I have reviewed the triage vital signs and the nursing  notes.  Pertinent labs & imaging results that were available during my care of the patient were reviewed by me and considered in my medical decision making (see chart  for details).    MDM Rules/Calculators/A&P                         Initial impression-patient presents after a fall.  She is alert, does not appear in acute distress, vital signs reassuring.  Concern for orthopedic injuries, will obtain CT head, lumbar spine, left elbow and knee, obtain basic lab work-up and reassess.  Work-up-CBC unremarkable, CMP shows hyperglycemia 102, elevated BUN of 21, MR of brain negative for acute findings, CT head negative for acute findings.  CT lumbar spine negative for acute findings, does show spondylosis of the lumbar spine, x-ray of elbow and knee negative for acute findings  Reassessment notified by radiology tech that patient refused x-ray of her left hip, this was indicated as patient mentioned to me that she was having hip pain with movement and she fell on that side.  I did not note obvious external or internal rotation  leg  Or shortening, I doubt pelvis or hip fracture but I cannot fully exclude small fracture without imaging.  Patient was updated on lab work and imaging, patient has no complaints this time, vital signs remained stable, will discharge patient at this time.  Rule out-I have low suspicion for CVA or intracranial head bleed as there is no neurodeficits on my exam, MRI and CT head both negative for acute findings. low  Suspicion for systemic infection as patient nontoxic-appearing, vital signs reassuring, no obvious infection noted on my exam.  Low suspicion for orthopedic injury as imaging is negative, there is no gross deformity on my exam, patient is moving all 4 extremities out difficulty. low Suspicion for UTI, pyelonephritis, kidney stones as patient denies urinary symptoms, no CVA tenderness on my exam, UA negative for signs infection or hematuria.  Low suspicion for ACS or arrhythmias as patient has chest pain, shortness of breath, EKG without signs of ischemia.    Plan-  1.  Dizziness-suspect secondary due to dehydration, will recommend  patient stays hydrated, follow-up with neurologist for further evaluation.  Vital signs have remained stable, no indication for hospital admission.  Patient discussed with attending and they agreed with assessment and plan.  Patient given at home care as well strict return precautions.  Patient verbalized that they understood agreed to said plan.   Final Clinical Impression(s) / ED Diagnoses Final diagnoses:  Fall, initial encounter    Rx / DC Orders ED Discharge Orders    None       Aron Baba 10/10/20 1901    Isla Pence, MD 10/11/20 [phone]

## 2020-10-10 NOTE — ED Notes (Signed)
Pt had a nuns cap in bathroom went to the bathroom pt missed the nuns caps put a purewick on pt.

## 2020-10-10 NOTE — ED Notes (Signed)
Rechecked purewick no urine

## 2020-10-10 NOTE — ED Triage Notes (Signed)
Pt fell today twice. Stated her legs got weakness. Denies hitting head. C/o of bilateral ankle pain and left elbow pain

## 2020-10-10 NOTE — ED Notes (Signed)
Patient transported to MRI 

## 2020-10-10 NOTE — ED Notes (Signed)
Given water and apple sauce.  Tray ordered.  Mother to get food from lobby.  In and out done with Jan Fireman, RN

## 2020-10-10 NOTE — Discharge Instructions (Addendum)
Exam and imaging all look reassuring.  I recommend continuing with your home medication as prescribed.  Please stay hydrated as this can sometimes cause you to fall.  I like to follow-up with your neurologist for further evaluation.  Come back to the emergency department if you develop chest pain, shortness of breath, severe abdominal pain, uncontrolled nausea, vomiting, diarrhea.

## 2020-10-11 ENCOUNTER — Telehealth: Payer: Self-pay | Admitting: Neurology

## 2020-10-11 NOTE — Telephone Encounter (Signed)
Called and LMVM for returning call relating to falls. (pt seen in ED. Dizziness, dehyration.)  Rec. See neuro.  May need to see pcp initally and if needs to see [place] referral to MD.

## 2020-10-11 NOTE — Telephone Encounter (Signed)
Pt's mother has called to report pt needs to be seen due to new symptoms and 2 falls on yesterday.  Mother was advised because of new symptoms pt wouldn't be able to see NP.  Mother is asking for a call to discuss pt's hospital visit on yesterday(as a result of 2 falls yesterday)

## 2020-10-12 NOTE — Telephone Encounter (Signed)
Chart reviewed, she is seen in our office with history of seizures, is on Depakote and Vimpat.  In the ER 10/10/20, reporting a few over the several days, because her legs gave out.  Reported this is normal for her, and she can have multiple falls, but mother thinks this is atypical.  MRI of the brain and head CT was negative for acute findings.  CT lumbar spine was negative, did show spondylosis of the lumbar spine, x-ray of elbow and knee were negative.  She refused a left hip x-ray.  If the concern is dizziness, she may benefit from having Depakote and Vimpat level checked, ensure no medication changes, I do not see this was done recently.  ER felt dizziness was secondary to dehydration, she should stay well hydrated. Next appointment is in June, Depakote level was 91 in Dec, likely similar unless change in medication. Have her see her PCP, move her follow-up if sooner opens but I don't think urgent at this time. Can come by for labs for seizure levels if she would like.

## 2020-10-12 NOTE — Telephone Encounter (Signed)
I called mother of pt.  She is hydrating, pt states in background. .  She is still weak.  Chart reviewed by SS/NP.  See pcp for initial ED f/u.  Moved up appt 11-09-20 1415 for evalu/annual RV.  No seizures.  Mother concerned pt has Parkinsons, hands trembly and neck stoop.  Offered 10-26-20 1015 and cannot do am appt. Encourage fluids and increasing strength (exercises) if she can.

## 2020-10-23 ENCOUNTER — Other Ambulatory Visit: Payer: Self-pay | Admitting: Neurology

## 2020-10-23 MED ORDER — DIVALPROEX SODIUM 500 MG PO DR TAB: DELAYED_RELEASE_TABLET | ORAL | 3 refills | Status: DC

## 2020-10-23 MED ORDER — CLONAZEPAM 0.5 MG PO TABS: 0.5000 mg | ORAL_TABLET | Freq: Two times a day (BID) | ORAL | 0 refills | Status: DC

## 2020-10-23 NOTE — Telephone Encounter (Signed)
Last seen 11-22-19, next appt 11-09-20 (was seen in ED recently for weakness/ dehydration).  Wants to get mail order [facility].

## 2020-10-23 NOTE — Telephone Encounter (Signed)
Pt is needing a refill on her clonazePAM (KLONOPIN) 0.5 MG tablet and her divalproex (DEPAKOTE) 500 MG DR tablet sent in to the [facility]

## 2020-10-24 DIAGNOSIS — E782 Mixed hyperlipidemia: Secondary | ICD-10-CM | POA: Diagnosis not present

## 2020-10-24 DIAGNOSIS — I509 Heart failure, unspecified: Secondary | ICD-10-CM

## 2020-10-24 DIAGNOSIS — M542 Cervicalgia: Secondary | ICD-10-CM | POA: Diagnosis not present

## 2020-10-24 DIAGNOSIS — D649 Anemia, unspecified: Secondary | ICD-10-CM | POA: Insufficient documentation

## 2020-10-24 DIAGNOSIS — J309 Allergic rhinitis, unspecified: Secondary | ICD-10-CM | POA: Insufficient documentation

## 2020-10-24 DIAGNOSIS — Z8619 Personal history of other infectious and parasitic diseases: Secondary | ICD-10-CM | POA: Insufficient documentation

## 2020-10-24 DIAGNOSIS — K635 Polyp of colon: Secondary | ICD-10-CM | POA: Insufficient documentation

## 2020-10-24 DIAGNOSIS — N3281 Overactive bladder: Secondary | ICD-10-CM

## 2020-10-24 DIAGNOSIS — F419 Anxiety disorder, unspecified: Secondary | ICD-10-CM | POA: Insufficient documentation

## 2020-10-24 DIAGNOSIS — M8580 Other specified disorders of bone density and structure, unspecified site: Secondary | ICD-10-CM | POA: Insufficient documentation

## 2020-10-24 HISTORY — DX: Heart failure, unspecified: I50.9

## 2020-10-24 HISTORY — DX: Overactive bladder: N32.81

## 2020-10-31 DIAGNOSIS — M542 Cervicalgia: Secondary | ICD-10-CM | POA: Diagnosis not present

## 2020-10-31 DIAGNOSIS — R531 Weakness: Secondary | ICD-10-CM | POA: Diagnosis not present

## 2020-10-31 DIAGNOSIS — R41841 Cognitive communication deficit: Secondary | ICD-10-CM | POA: Diagnosis not present

## 2020-10-31 DIAGNOSIS — R293 Abnormal posture: Secondary | ICD-10-CM | POA: Diagnosis not present

## 2020-10-31 DIAGNOSIS — Z9181 History of falling: Secondary | ICD-10-CM | POA: Diagnosis not present

## 2020-11-06 ENCOUNTER — Encounter (INDEPENDENT_AMBULATORY_CARE_PROVIDER_SITE_OTHER): Payer: Self-pay | Admitting: Gastroenterology

## 2020-11-06 ENCOUNTER — Ambulatory Visit (INDEPENDENT_AMBULATORY_CARE_PROVIDER_SITE_OTHER): Payer: Medicare Other | Admitting: Gastroenterology

## 2020-11-06 ENCOUNTER — Other Ambulatory Visit: Payer: Self-pay

## 2020-11-06 VITALS — BP 132/79 | HR 82 | Temp 97.9°F | Ht 66.0 in | Wt 141.0 lb

## 2020-11-06 DIAGNOSIS — K219 Gastro-esophageal reflux disease without esophagitis: Secondary | ICD-10-CM | POA: Diagnosis not present

## 2020-11-06 MED ORDER — OMEPRAZOLE 20 MG PO CPDR: 20.0000 mg | DELAYED_RELEASE_CAPSULE | Freq: Every day | ORAL | 3 refills | Status: DC

## 2020-11-06 NOTE — Progress Notes (Signed)
[name], M.D. Gastroenterology & Hepatology [facility] [address] [place], [place] [address]  Primary Care Physician: Celene Squibb, MD [place] [address]  I will communicate my assessment and recommendations to the referring MD via EMR.  Problems: 1. GERD  History of Present Illness: Maria [name] is a 60 y.o. female with PMH GERD, cognitive impairment, seizures, who presents for follow up of GERD.  The patient was last seen on 11/03/2019. At that time, the patient was continued on omeprazole 40 mg every day.  She comes to the office with her sister.  The patient denies having any complaints.  He states that she has had adequate control of her heartburn as she has not presented any symptoms while taking the omeprazole.  Takes medication prior to the morning at least 30 minutes before breakfast.  Denies any dysphagia, odynophagia, nausea, vomiting, fever, chills, hematochezia, melena, hematemesis, abdominal distention, abdominal pain, diarrhea, jaundice, pruritus or weight loss.  Last EGD: 2016 - Ectopic islands of gastric tissue below upper esophageal sphincter. Noncritical Schatzki's ring and small sliding hiatal hernia. Small hyperplastic appearing polyps at gastric body. These were left alone. Erosive gastritis with small gastric ulcer at antrum. Esophagus dilated by passing 57 French Maloney dilator resulting in linear disruption at GE junction and also it proximal esophagus indicative of disrupted web.  Last Colonoscopy: 11/2018-for positive Cologuard test-12 mm polyp IC valve, 8 to 12 mm polyp mid transverse partially retrieved, external hemorrhoids. Repeat in 2023  Past Medical History: Past Medical History:  Diagnosis Date  . Constipation   . Dysphagia   . Fracture    R foot  . GERD (gastroesophageal reflux disease)   . History of shingles 10/2016  . Mental retardation    lesion in head   . Seizures ([place])    "not fully controlled on max doses of meds" (Neuro ofc note 12/2014)  . Thrombocytopenia ([place])   . Tremor     Past Surgical History: Past Surgical History:  Procedure Laterality Date  . BIOPSY  09/16/2014   Procedure: BIOPSY;  Surgeon: Rogene Houston, MD;  Location: [facility];  Service: Endoscopy;;  . CATARACT EXTRACTION     both eyes, May of 2015  . COLONOSCOPY    . COLONOSCOPY WITH PROPOFOL N/A 11/20/2018   Procedure: COLONOSCOPY WITH PROPOFOL;  Surgeon: Rogene Houston, MD;  [facility]: [facility];  Service: Endoscopy;  Laterality: N/A;  . ESOPHAGEAL DILATION N/A 09/16/2014   Procedure: ESOPHAGEAL DILATION WITH 54FR MALONEY DILATOR;  Surgeon: Rogene Houston, MD;  Location: [facility];  Service: Endoscopy;  Laterality: N/A;  . ESOPHAGOGASTRODUODENOSCOPY (EGD) WITH PROPOFOL N/A 09/16/2014   Procedure: ESOPHAGOGASTRODUODENOSCOPY (EGD) WITH PROPOFOL;  Surgeon: Rogene Houston, MD;  Location: [facility];  Service: Endoscopy;  Laterality: N/A;  . LAPAROSCOPIC APPENDECTOMY N/A 07/14/2020   Procedure: APPENDECTOMY LAPAROSCOPIC;  Surgeon: Virl Cagey, MD;  Location: [facility];  Service: General;  Laterality: N/A;  . MOUTH SURGERY    . POLYPECTOMY  11/20/2018   Procedure: POLYPECTOMY;  Surgeon: Rogene Houston, MD;  [facility]: [facility];  Service: Endoscopy;;  colon  . Skin graft to gum Right 08/2013  . TONSILLECTOMY AND ADENOIDECTOMY    . TOTAL ABDOMINAL HYSTERECTOMY      Family History: Family History  Problem Relation Age of Onset  . High Cholesterol Mother   . High blood pressure Mother   . Diabetes Father  Social History: Social History   Tobacco Use  Smoking Status Former Smoker  . Packs/day: 1.50  . Years: 15.00  . Pack years: 22.50  . Types: Cigarettes  . Quit date: 05/22/2006  . Years since quitting: 14.4  Smokeless Tobacco Never Used   Social History   Substance and Sexual Activity  Alcohol Use No  . Alcohol/week: 0.0 standard drinks    Social History   Substance and Sexual Activity  Drug Use No    Allergies: Allergies  Allergen Reactions  . Codeine Nausea And Vomiting  . Lamictal [Lamotrigine] Rash    Medications: Current Outpatient Medications  Medication Sig Dispense Refill  . acetaminophen (TYLENOL) 500 MG tablet Take 1 tablet (500 mg total) by mouth every 6 (six) hours as needed for moderate pain. 30 tablet 0  . atorvastatin (LIPITOR) 20 MG tablet Take 20 mg by mouth daily.    . benzonatate (TESSALON) 200 MG capsule TAKE 1 CAPSULE (200 MG TOTAL) BY MOUTH THREE TIMES DAILY. 20 capsule 0  . clonazePAM (KLONOPIN) 0.5 MG tablet Take 1 tablet (0.5 mg total) by mouth 2 (two) times daily. 180 tablet 0  . divalproex (DEPAKOTE) 500 MG DR tablet TAKE 2 TABLETS (1,000 MG TOTAL) BY MOUTH DAILY. 180 tablet 3  . docusate sodium (COLACE) 100 MG capsule Take 1 capsule (100 mg total) by mouth 2 (two) times daily. 10 capsule 0  . feeding supplement (ENSURE ENLIVE / ENSURE PLUS) LIQD Take 237 mLs by mouth 3 (three) times daily between meals. 237 mL 12  . Fluticasone-Salmeterol (ADVAIR) 100-50 MCG/DOSE AEPB INHALE 1 PUFF INTO THE LUNGS IN THE MORNING AND AT BEDTIME. 60 each 2  . lacosamide (VIMPAT) 200 MG TABS tablet Take 1 tablet (200 mg total) by mouth 2 (two) times daily. 180 tablet 1  . levocetirizine (XYZAL) 5 MG tablet Take 5 mg by mouth every evening.    . melatonin 3 MG TABS tablet TAKE 1 TABLET (3 MG TOTAL) BY MOUTH AT BEDTIME AS NEEDED (INSOMINA). 20 tablet 0  . metoprolol tartrate (LOPRESSOR) 25 MG tablet TAKE 1 TABLET (25 MG TOTAL) BY MOUTH TWO TIMES DAILY. 60 tablet 0  . omeprazole (PRILOSEC) 40 MG capsule Take 1 capsule (40 mg total) by mouth daily. 90 capsule 3  . ondansetron (ZOFRAN) 4 MG tablet Take 1 tablet (4 mg total) by mouth every 8 (eight) hours as needed. 30 tablet 1  . oxybutynin (DITROPAN) 5 MG tablet Take 5 mg by mouth at bedtime.    . Pediatric Multiple Vit-C-FA (PEDIATRIC MULTIVITAMIN) chewable  tablet Chew 1 tablet by mouth daily.     No current facility-administered medications for this visit.    Review of Systems: GENERAL: negative for malaise, night sweats HEENT: No changes in hearing or vision, no nose bleeds or other nasal problems. NECK: Negative for lumps, goiter, pain and significant neck swelling RESPIRATORY: Negative for cough, wheezing CARDIOVASCULAR: Negative for chest pain, leg swelling, palpitations, orthopnea GI: SEE HPI MUSCULOSKELETAL: Negative for joint pain or swelling, back pain, and muscle pain. SKIN: Negative for lesions, rash PSYCH: Negative for sleep disturbance, mood disorder and recent psychosocial stressors. HEMATOLOGY Negative for prolonged bleeding, bruising easily, and swollen nodes. ENDOCRINE: Negative for cold or heat intolerance, polyuria, polydipsia and goiter. NEURO: negative for tremor, gait imbalance, syncope and seizures. The remainder of the review of systems is noncontributory.   Physical Exam: BP 132/79 (BP Location: Right Arm, Patient Position: Sitting, Cuff Size: Small)   Pulse 82   Temp 97.9  F (36.6 C) (Oral)   Ht 5\' 6"  (1.676 m)   Wt 141 lb (64 kg)   BMI 22.76 kg/m  GENERAL: The patient is AO x3, in no acute distress. HEENT: Head is normocephalic and atraumatic. EOMI are intact. Mouth is well hydrated and without lesions. NECK: Supple. No masses LUNGS: Clear to auscultation. No presence of rhonchi/wheezing/rales. Adequate chest expansion HEART: RRR, normal s1 and s2. ABDOMEN: Soft, nontender, no guarding, no peritoneal signs, and nondistended. BS +. No masses. EXTREMITIES: Without any cyanosis, clubbing, rash, lesions or edema. NEUROLOGIC: AOx3, no focal motor deficit. SKIN: no jaundice, no rashes  Imaging/Labs: as above  I personally reviewed and interpreted the available labs, imaging and endoscopic files.  Impression and Plan: Maria [name] is a 60 y.o. female with PMH GERD, cognitive impairment, seizures, who  presents for follow up of GERD.  Patient has presented adequate control of her reflux while taking PPI.  I had a thorough discussion with the family member and the patient regarding decreasing the dose of the medication, especially I think concerned of the long-term side effects including CKD. Patient and family member where re-assured regarding PPI use and safety of when appropriate indications in question. Most recent studies on PPI therapy that association of symptoms is not equivalent to causation and overall association with for example osteoporosis is weak and based on observational studies. When PPI use is indicated, it is safe to proceed with therapy and titrate dosing/use based on symptom response.  Due to this, a decision was made to decrease her dose of omeprazole to 20 mg every day.  If she has recurrence of symptoms we can go back to 40 mg dosing.  -Decrease omeprazole to 20 mg qday -Explained presumed etiology of reflux symptoms. Instruction provided in the use of antireflux medication - patient should take medication in the morning 30-45 minutes before eating breakfast. Discussed avoidance of eating within 2 hours of lying down to sleep and benefit of blocks to elevate head of bed.  All questions were answered.      Harvel Quale, MD Gastroenterology and Hepatology [facility]

## 2020-11-06 NOTE — Patient Instructions (Signed)
Decrease omeprazole to 20 mg qday Explained presumed etiology of reflux symptoms. Instruction provided in the use of antireflux medication - patient should take medication in the morning 30-45 minutes before eating breakfast. Discussed avoidance of eating within 2 hours of lying down to sleep and benefit of blocks to elevate head of bed.

## 2020-11-09 ENCOUNTER — Ambulatory Visit (INDEPENDENT_AMBULATORY_CARE_PROVIDER_SITE_OTHER): Payer: Medicare Other | Admitting: Neurology

## 2020-11-09 ENCOUNTER — Encounter: Payer: Self-pay | Admitting: Neurology

## 2020-11-09 VITALS — BP 128/80 | HR 80 | Ht 66.0 in | Wt 143.0 lb

## 2020-11-09 DIAGNOSIS — G40909 Epilepsy, unspecified, not intractable, without status epilepticus: Secondary | ICD-10-CM | POA: Diagnosis not present

## 2020-11-09 NOTE — Patient Instructions (Signed)
Continue current medications Check labs today  See you back in 1 year

## 2020-11-09 NOTE — Progress Notes (Signed)
PATIENT: Maria [name] DOB: February 07, 1961  REASON FOR VISIT: follow up HISTORY FROM: patient  HISTORY OF PRESENT ILLNESS: Today 11/09/20 Maria [name] is a 60 year old female with history of seizures.  Lives with her mother. In January, hospitalized for 45 days with a renal abscess and sepsis, was delirious, some question if she had a seizure, but her Depakote levels dropped to nothing, probably not getting the Depakote.  Went back on Depakote, Vimpat, clonazepam. Then went back to have appendix out.    In the ER 10/10/20, reporting a few falls over the several days, because her legs gave out. MRI of the brain and head CT was negative for acute findings.  CT lumbar spine was negative, did show spondylosis of the lumbar spine, x-ray of elbow and knee were negative.  She refused a left hip x-ray. CBC, CMP were unremarkable, glucose was 102, BUN 21, creatinine 0.83.  No recent seizures. No more falls. Feeling good since in ER, no issues. ER issue was felt dehydration issue. Tremor in hands when nervous. Is back to normal baseline. Denies dizziness. No new issues, claims doing well. Here today with her mom.  Update 11/25/2019 SS: [name] is a 60 year old female with history of seizures.  No recent seizures, last was about 4 years ago.  She remains on clonazepam, Depakote, and Vimpat.  She lives with her mother.  She does not drive a car.  Her mother manages her medications.  She had 1 fall recently, tripped on her flip-flop, sprained her left ankle, is wearing a boot. May have occasional tremors of her hands, noted when playing cards.  Denies any new problems or concerns. Had blood work done with PCP last week. Presents today for evaluation accompanied by her mom.  HISTORY 10/20/2018 MM: Maria [name] is a 60 year old female with a history of seizures.  She was unable to do and did not feel comfortable coming to the office due to COVID-19.  We discussed her seizures over the phone.  I also spoke with  her mother today with information as well.  The patient has not had any recent seizures.  She is doing well on clonazepam, Depakote and Vimpat.  She lives with her mother.  She is able to complete ADLs independently.  She does not operate a motor vehicle.  Denies any changes with her gait or balance.  Denies any new issues.  REVIEW OF SYSTEMS: Out of a complete 14 system review of symptoms, the patient complains only of the following symptoms, and all other reviewed systems are negative.  See HPI  ALLERGIES: Allergies  Allergen Reactions   Codeine Nausea And Vomiting   Lamictal [Lamotrigine] Rash    HOME MEDICATIONS: Outpatient Medications Prior to Visit  Medication Sig Dispense Refill   acetaminophen (TYLENOL) 500 MG tablet Take 1 tablet (500 mg total) by mouth every 6 (six) hours as needed for moderate pain. 30 tablet 0   atorvastatin (LIPITOR) 20 MG tablet Take 20 mg by mouth daily.     clonazePAM (KLONOPIN) 0.5 MG tablet Take 1 tablet (0.5 mg total) by mouth 2 (two) times daily. 180 tablet 0   divalproex (DEPAKOTE) 500 MG DR tablet TAKE 2 TABLETS (1,000 MG TOTAL) BY MOUTH DAILY. 180 tablet 3   docusate sodium (COLACE) 100 MG capsule Take 1 capsule (100 mg total) by mouth 2 (two) times daily. 10 capsule 0   feeding supplement (ENSURE ENLIVE / ENSURE PLUS) LIQD Take 237 mLs by mouth 3 (three) times daily  between meals. 237 mL 12   lacosamide (VIMPAT) 200 MG TABS tablet Take 1 tablet (200 mg total) by mouth 2 (two) times daily. 180 tablet 1   levocetirizine (XYZAL) 5 MG tablet Take 5 mg by mouth every evening.     omeprazole (PRILOSEC) 20 MG capsule Take 1 capsule (20 mg total) by mouth daily. 90 capsule 3   ondansetron (ZOFRAN) 4 MG tablet Take 1 tablet (4 mg total) by mouth every 8 (eight) hours as needed. 30 tablet 1   Pediatric Multiple Vit-C-FA (PEDIATRIC MULTIVITAMIN) chewable tablet Chew 1 tablet by mouth daily.     Fluticasone-Salmeterol (ADVAIR) 100-50 MCG/DOSE AEPB INHALE 1 PUFF  INTO THE LUNGS IN THE MORNING AND AT BEDTIME. (Patient not taking: Reported on 11/06/2020) 60 each 2   melatonin 3 MG TABS tablet TAKE 1 TABLET (3 MG TOTAL) BY MOUTH AT BEDTIME AS NEEDED (INSOMINA). (Patient not taking: Reported on 11/06/2020) 20 tablet 0   metoprolol tartrate (LOPRESSOR) 25 MG tablet TAKE 1 TABLET (25 MG TOTAL) BY MOUTH TWO TIMES DAILY. 60 tablet 0   oxybutynin (DITROPAN) 5 MG tablet Take 5 mg by mouth at bedtime. (Patient not taking: Reported on 11/06/2020)     No facility-administered medications prior to visit.    PAST MEDICAL HISTORY: Past Medical History:  Diagnosis Date   Constipation    Dysphagia    Fracture    R foot   GERD (gastroesophageal reflux disease)    History of shingles 10/2016   Mental retardation    lesion in head   Seizures ([place])    "not fully controlled on max doses of meds" (Neuro ofc note 12/2014)   Thrombocytopenia ([place])    Tremor     PAST SURGICAL HISTORY: Past Surgical History:  Procedure Laterality Date   BIOPSY  09/16/2014   Procedure: BIOPSY;  Surgeon: Rogene Houston, MD;  Location: [facility];  Service: Endoscopy;;   CATARACT EXTRACTION     both eyes, May of 2015   COLONOSCOPY     COLONOSCOPY WITH PROPOFOL N/A 11/20/2018   Procedure: COLONOSCOPY WITH PROPOFOL;  Surgeon: Rogene Houston, MD;  [facility];  Service: Endoscopy;  Laterality: N/A;   ESOPHAGEAL DILATION N/A 09/16/2014   Procedure: ESOPHAGEAL DILATION WITH 54FR MALONEY DILATOR;  Surgeon: Rogene Houston, MD;  Location: [facility];  Service: Endoscopy;  Laterality: N/A;   ESOPHAGOGASTRODUODENOSCOPY (EGD) WITH PROPOFOL N/A 09/16/2014   Procedure: ESOPHAGOGASTRODUODENOSCOPY (EGD) WITH PROPOFOL;  Surgeon: Rogene Houston, MD;  Location: [facility];  Service: Endoscopy;  Laterality: N/A;   LAPAROSCOPIC APPENDECTOMY N/A 07/14/2020   Procedure: APPENDECTOMY LAPAROSCOPIC;  Surgeon: Virl Cagey, MD;  Location: [facility];  Service: General;  Laterality: N/A;   MOUTH SURGERY      POLYPECTOMY  11/20/2018   Procedure: POLYPECTOMY;  Surgeon: Rogene Houston, MD;  [facility];  Service: Endoscopy;;  colon   Skin graft to gum Right 08/2013   TONSILLECTOMY AND ADENOIDECTOMY     TOTAL ABDOMINAL HYSTERECTOMY      FAMILY HISTORY: Family History  Problem Relation Age of Onset   High Cholesterol Mother    High blood pressure Mother    Diabetes Father     SOCIAL HISTORY: Social History   Socioeconomic History   Marital status: Single    Spouse name: Not on file   Number of children: 0   Years of education: 10   Highest education level: Not on file  Occupational History  Employer: UNEMPLOYED  Tobacco Use   Smoking status: Former    Packs/day: 1.50    Years: 15.00    Pack years: 22.50    Types: Cigarettes    Quit date: 05/22/2006    Years since quitting: 14.4   Smokeless tobacco: Never  Vaping Use   Vaping Use: Never used  Substance and Sexual Activity   Alcohol use: No    Alcohol/week: 0.0 standard drinks   Drug use: No   Sexual activity: Never    Birth control/protection: Other-see comments    Comment: Hysterectomy  Other Topics Concern   Not on file  Social History Narrative   Patient is single and lives with her mother Romie Minus)      Patient drinks 3 cups of caffeine daily.   Patient is right handed.         Social Determinants of Health   Financial Resource Strain: Not on file  Food Insecurity: Not on file  Transportation Needs: Not on file  Physical Activity: Not on file  Stress: Not on file  Social Connections: Not on file  Intimate Partner Violence: Not on file   PHYSICAL EXAM  Vitals:   11/09/20 1405  BP: 128/80  Pulse: 80  Weight: 143 lb (64.9 kg)  Height: 5\' 6"  (1.676 m)    Body mass index is 23.08 kg/m.  Generalized: Well developed, in no acute distress, seems uninterested  Neurological examination  Mentation: Alert oriented to time, place, relies on her mother for history. Follows all commands speech and  language fluent Cranial nerve II-XII: Pupils were equal round reactive to light. Extraocular movements were full, visual field were full on confrontational test. Facial sensation and strength were normal.  Head turning and shoulder shrug  were normal and symmetric. Motor: Good strength throughout Sensory: Sensory testing is intact to soft touch on all 4 extremities. No evidence of extinction is noted.  Coordination: Cerebellar testing reveals good finger-nose-finger and heel-to-shin bilaterally. No tremor seen.  Gait and station: Gait is steady, wearing flip flops, does tend to look down Reflexes: Deep tendon reflexes are symmetric and normal bilaterally.   DIAGNOSTIC DATA (LABS, IMAGING, TESTING) - I reviewed patient records, labs, notes, testing and imaging myself where available.  Lab Results  Component Value Date   WBC 6.6 10/10/2020   HGB 12.3 10/10/2020   HCT 38.6 10/10/2020   MCV 94.8 10/10/2020   PLT 150 10/10/2020      Component Value Date/Time   NA 139 10/10/2020 1257   NA 143 11/05/2016 1104   K 3.8 10/10/2020 1257   CL 105 10/10/2020 1257   CO2 26 10/10/2020 1257   GLUCOSE 102 (H) 10/10/2020 1257   BUN 21 (H) 10/10/2020 1257   BUN 25 (H) 11/05/2016 1104   CREATININE 0.83 10/10/2020 1257   CALCIUM 9.5 10/10/2020 1257   PROT 6.5 10/10/2020 1257   PROT 6.8 11/05/2016 1104   ALBUMIN 3.8 10/10/2020 1257   ALBUMIN 4.2 11/05/2016 1104   AST 18 10/10/2020 1257   ALT 19 10/10/2020 1257   ALKPHOS 67 10/10/2020 1257   BILITOT 0.6 10/10/2020 1257   BILITOT 0.3 11/05/2016 1104   GFRNONAA >60 10/10/2020 1257   GFRAA >60 06/11/2017 1542   Lab Results  Component Value Date   CHOL  10/13/2009    169        ATP III CLASSIFICATION:  <200     mg/dL   Desirable  200-239  mg/dL   Borderline High  >=240  mg/dL   High          HDL 47 10/13/2009   LDLCALC (H) 10/13/2009    108        Total Cholesterol/HDL:CHD Risk Coronary Heart Disease Risk Table                     Men    Women  1/2 Average Risk   3.4   3.3  Average Risk       5.0   4.4  2 X Average Risk   9.6   7.1  3 X Average Risk  23.4   11.0        Use the calculated Patient Ratio above and the CHD Risk Table to determine the patient's CHD Risk.        ATP III CLASSIFICATION (LDL):  <100     mg/dL   Optimal  100-129  mg/dL   Near or Above                    Optimal  130-159  mg/dL   Borderline  160-189  mg/dL   High  >190     mg/dL   Very High   TRIG 69 10/13/2009   CHOLHDL 3.6 10/13/2009   Lab Results  Component Value Date   HGBA1C 5.7 (H) 04/17/2020   Lab Results  Component Value Date   VITAMINB12 1,246 (H) 05/08/2020   Lab Results  Component Value Date   TSH 2.660 03/16/2014   ASSESSMENT AND PLAN 60 y.o. year old female  has a past medical history of Constipation, Dysphagia, Fracture, GERD (gastroesophageal reflux disease), History of shingles (10/2016), Mental retardation, Seizures ([place]), Thrombocytopenia ([place]), and Tremor. here with:  1.  Seizures  -Recent ER visit for falls, this resolved, mother thinks was related to dehydration, no further falls or dizziness since -No recent seizures -Will continue clonazepam, Depakote, Vimpat at current dosing -Will check seizure levels, along with ammonia -Continue close follow-up with PCP, follow-up here in 1 year or sooner if needed  I spent 32 minutes of face-to-face and non-face-to-face time with patient.  This included previsit chart review, lab review, study review, order entry, discussing her ER visit, symptoms, medications, management.   Butler Denmark, AGNP-C, DNP 11/09/2020, 2:31 PM [facility] [address], [place], [place] [address] [phone]

## 2020-11-09 NOTE — Progress Notes (Signed)
I have read the note, and I agree with the clinical assessment and plan.  Maria [name] K Maria [name]   

## 2020-11-10 DIAGNOSIS — Z9181 History of falling: Secondary | ICD-10-CM | POA: Diagnosis not present

## 2020-11-10 DIAGNOSIS — R531 Weakness: Secondary | ICD-10-CM | POA: Diagnosis not present

## 2020-11-10 DIAGNOSIS — R293 Abnormal posture: Secondary | ICD-10-CM | POA: Diagnosis not present

## 2020-11-10 DIAGNOSIS — M542 Cervicalgia: Secondary | ICD-10-CM | POA: Diagnosis not present

## 2020-11-10 DIAGNOSIS — R41841 Cognitive communication deficit: Secondary | ICD-10-CM | POA: Diagnosis not present

## 2020-11-13 DIAGNOSIS — Z6824 Body mass index (BMI) 24.0-24.9, adult: Secondary | ICD-10-CM | POA: Diagnosis not present

## 2020-11-13 DIAGNOSIS — Z01419 Encounter for gynecological examination (general) (routine) without abnormal findings: Secondary | ICD-10-CM | POA: Diagnosis not present

## 2020-11-14 LAB — LACOSAMIDE: Lacosamide: 18.9 ug/mL — ABNORMAL HIGH (ref 5.0–10.0)

## 2020-11-14 LAB — AMMONIA: Ammonia: 38 ug/dL (ref 34–178)

## 2020-11-14 LAB — VALPROIC ACID LEVEL: Valproic Acid Lvl: 119 ug/mL — ABNORMAL HIGH (ref 50–100)

## 2020-11-15 ENCOUNTER — Other Ambulatory Visit: Payer: Self-pay

## 2020-11-15 ENCOUNTER — Telehealth: Payer: Self-pay

## 2020-11-15 MED ORDER — OXYBUTYNIN CHLORIDE 5 MG PO TABS: 5.0000 mg | ORAL_TABLET | Freq: Every day | ORAL | 1 refills | Status: DC

## 2020-11-15 NOTE — Progress Notes (Signed)
Mother called stating that patients symptoms got much worse when she tried to ween her off oxybutynin. Mother request for patient to start back on medication.

## 2020-11-20 ENCOUNTER — Telehealth: Payer: Self-pay | Admitting: *Deleted

## 2020-11-20 DIAGNOSIS — R293 Abnormal posture: Secondary | ICD-10-CM | POA: Diagnosis not present

## 2020-11-20 DIAGNOSIS — R531 Weakness: Secondary | ICD-10-CM | POA: Diagnosis not present

## 2020-11-20 DIAGNOSIS — R41841 Cognitive communication deficit: Secondary | ICD-10-CM | POA: Diagnosis not present

## 2020-11-20 DIAGNOSIS — M542 Cervicalgia: Secondary | ICD-10-CM | POA: Diagnosis not present

## 2020-11-20 DIAGNOSIS — Z9181 History of falling: Secondary | ICD-10-CM | POA: Diagnosis not present

## 2020-11-20 NOTE — Telephone Encounter (Signed)
Called sister, Lattie Haw on [place] and informed her the patient's Depakote and Vimpat levels are mildly elevated, could be related to timing of dosing.  NP would not alter dosing since no adverse effect or reported concerns. Ammonia level is normal. They should call for any concerns. She verbalized understanding, appreciation.

## 2020-11-23 ENCOUNTER — Other Ambulatory Visit: Payer: Self-pay | Admitting: Urology

## 2020-11-23 DIAGNOSIS — R41841 Cognitive communication deficit: Secondary | ICD-10-CM | POA: Diagnosis not present

## 2020-11-23 DIAGNOSIS — R351 Nocturia: Secondary | ICD-10-CM

## 2020-11-23 DIAGNOSIS — R293 Abnormal posture: Secondary | ICD-10-CM | POA: Diagnosis not present

## 2020-11-23 DIAGNOSIS — M542 Cervicalgia: Secondary | ICD-10-CM | POA: Diagnosis not present

## 2020-11-23 DIAGNOSIS — Z9181 History of falling: Secondary | ICD-10-CM | POA: Diagnosis not present

## 2020-11-23 DIAGNOSIS — R531 Weakness: Secondary | ICD-10-CM | POA: Diagnosis not present

## 2020-11-23 MED ORDER — OXYBUTYNIN CHLORIDE 5 MG PO TABS: ORAL_TABLET | ORAL | 3 refills | Status: DC

## 2020-11-24 NOTE — Telephone Encounter (Signed)
Message sent on my chart rx refilled.

## 2020-11-27 ENCOUNTER — Ambulatory Visit: Payer: Medicare Other | Admitting: Neurology

## 2020-11-28 DIAGNOSIS — R293 Abnormal posture: Secondary | ICD-10-CM | POA: Diagnosis not present

## 2020-11-28 DIAGNOSIS — Z9181 History of falling: Secondary | ICD-10-CM | POA: Diagnosis not present

## 2020-11-28 DIAGNOSIS — R41841 Cognitive communication deficit: Secondary | ICD-10-CM | POA: Diagnosis not present

## 2020-11-28 DIAGNOSIS — R531 Weakness: Secondary | ICD-10-CM | POA: Diagnosis not present

## 2020-11-28 DIAGNOSIS — M542 Cervicalgia: Secondary | ICD-10-CM | POA: Diagnosis not present

## 2020-11-30 DIAGNOSIS — M542 Cervicalgia: Secondary | ICD-10-CM | POA: Diagnosis not present

## 2020-11-30 DIAGNOSIS — R41841 Cognitive communication deficit: Secondary | ICD-10-CM | POA: Diagnosis not present

## 2020-11-30 DIAGNOSIS — R293 Abnormal posture: Secondary | ICD-10-CM | POA: Diagnosis not present

## 2020-11-30 DIAGNOSIS — R531 Weakness: Secondary | ICD-10-CM | POA: Diagnosis not present

## 2020-11-30 DIAGNOSIS — Z9181 History of falling: Secondary | ICD-10-CM | POA: Diagnosis not present

## 2020-12-01 DIAGNOSIS — Z20822 Contact with and (suspected) exposure to covid-19: Secondary | ICD-10-CM | POA: Diagnosis not present

## 2020-12-05 DIAGNOSIS — M542 Cervicalgia: Secondary | ICD-10-CM | POA: Diagnosis not present

## 2020-12-05 DIAGNOSIS — Z9181 History of falling: Secondary | ICD-10-CM | POA: Diagnosis not present

## 2020-12-05 DIAGNOSIS — R293 Abnormal posture: Secondary | ICD-10-CM | POA: Diagnosis not present

## 2020-12-05 DIAGNOSIS — R41841 Cognitive communication deficit: Secondary | ICD-10-CM | POA: Diagnosis not present

## 2020-12-05 DIAGNOSIS — R531 Weakness: Secondary | ICD-10-CM | POA: Diagnosis not present

## 2020-12-08 DIAGNOSIS — R293 Abnormal posture: Secondary | ICD-10-CM | POA: Diagnosis not present

## 2020-12-08 DIAGNOSIS — R41841 Cognitive communication deficit: Secondary | ICD-10-CM | POA: Diagnosis not present

## 2020-12-08 DIAGNOSIS — R531 Weakness: Secondary | ICD-10-CM | POA: Diagnosis not present

## 2020-12-08 DIAGNOSIS — Z9181 History of falling: Secondary | ICD-10-CM | POA: Diagnosis not present

## 2020-12-08 DIAGNOSIS — M542 Cervicalgia: Secondary | ICD-10-CM | POA: Diagnosis not present

## 2020-12-12 ENCOUNTER — Ambulatory Visit (INDEPENDENT_AMBULATORY_CARE_PROVIDER_SITE_OTHER): Payer: Medicare Other | Admitting: Urology

## 2020-12-12 ENCOUNTER — Other Ambulatory Visit: Payer: Self-pay

## 2020-12-12 ENCOUNTER — Encounter: Payer: Self-pay | Admitting: Urology

## 2020-12-12 DIAGNOSIS — R531 Weakness: Secondary | ICD-10-CM | POA: Diagnosis not present

## 2020-12-12 DIAGNOSIS — R41841 Cognitive communication deficit: Secondary | ICD-10-CM | POA: Diagnosis not present

## 2020-12-12 DIAGNOSIS — R293 Abnormal posture: Secondary | ICD-10-CM | POA: Diagnosis not present

## 2020-12-12 DIAGNOSIS — R351 Nocturia: Secondary | ICD-10-CM | POA: Diagnosis not present

## 2020-12-12 DIAGNOSIS — Z9181 History of falling: Secondary | ICD-10-CM | POA: Diagnosis not present

## 2020-12-12 DIAGNOSIS — M542 Cervicalgia: Secondary | ICD-10-CM | POA: Diagnosis not present

## 2020-12-12 NOTE — Progress Notes (Signed)
Urological Symptom Review  Patient is experiencing the following symptoms: Frequent urination Hard to postpone urination Have to strain to urinate   Review of Systems  Gastrointestinal (upper)  : Indigestion/heartburn  Gastrointestinal (lower) : Negative for lower GI symptoms  Constitutional : Fatigue  Skin: Skin rash/lesion  Eyes: Negative for eye symptoms  Ear/Nose/Throat : Negative for Ear/Nose/Throat symptoms  Hematologic/Lymphatic: Negative for Hematologic/Lymphatic symptoms  Cardiovascular : Negative for cardiovascular symptoms  Respiratory : Negative for respiratory symptoms  Endocrine: Negative for endocrine symptoms  Musculoskeletal: Negative for musculoskeletal symptoms  Neurological: Headaches  Psychologic: Negative for psychiatric symptoms

## 2020-12-12 NOTE — Progress Notes (Signed)
History of Present Illness: Maria [name] is a 60 y.o. year old female presents for follow-up.  At her last visit in March I recommended that she come off of the oxybutynin that she was taking at night.  She did well for quite some time.  However, she was out shopping with her mom several weeks ago and had urinary frequency at the shopping trip in the afternoon.  She is on oxybutynin IR.  Apparently, she does have anxiety when she is out in public which is not very often.  Past Medical History:  Diagnosis Date   Constipation    Dysphagia    Fracture    R foot   GERD (gastroesophageal reflux disease)    History of shingles 10/2016   Mental retardation    lesion in head   Seizures ([place])    "not fully controlled on max doses of meds" (Neuro ofc note 12/2014)   Thrombocytopenia ([place])    Tremor     Past Surgical History:  Procedure Laterality Date   BIOPSY  09/16/2014   Procedure: BIOPSY;  Surgeon: Rogene Houston, MD;  Location: [facility];  Service: Endoscopy;;   CATARACT EXTRACTION     both eyes, May of 2015   COLONOSCOPY     COLONOSCOPY WITH PROPOFOL N/A 11/20/2018   Procedure: COLONOSCOPY WITH PROPOFOL;  Surgeon: Rogene Houston, MD;  Location: [facility];  Service: Endoscopy;  Laterality: N/A;   ESOPHAGEAL DILATION N/A 09/16/2014   Procedure: ESOPHAGEAL DILATION WITH 54FR MALONEY DILATOR;  Surgeon: Rogene Houston, MD;  Location: [facility];  Service: Endoscopy;  Laterality: N/A;   ESOPHAGOGASTRODUODENOSCOPY (EGD) WITH PROPOFOL N/A 09/16/2014   Procedure: ESOPHAGOGASTRODUODENOSCOPY (EGD) WITH PROPOFOL;  Surgeon: Rogene Houston, MD;  Location: [facility];  Service: Endoscopy;  Laterality: N/A;   LAPAROSCOPIC APPENDECTOMY N/A 07/14/2020   Procedure: APPENDECTOMY LAPAROSCOPIC;  Surgeon: Virl Cagey, MD;  Location: [facility];  Service: General;  Laterality: N/A;   MOUTH SURGERY     POLYPECTOMY  11/20/2018   Procedure: POLYPECTOMY;  Surgeon: Rogene Houston, MD;  [facility];   Service: Endoscopy;;  colon   Skin graft to gum Right 08/2013   TONSILLECTOMY AND ADENOIDECTOMY     TOTAL ABDOMINAL HYSTERECTOMY      Home Medications:  (Not in a hospital admission)   Allergies:  Allergies  Allergen Reactions   Codeine Nausea And Vomiting   Lamictal [Lamotrigine] Rash    Family History  Problem Relation Age of Onset   High Cholesterol Mother    High blood pressure Mother    Diabetes Father     Social History:  reports that she quit smoking about 14 years ago. Her smoking use included cigarettes. She has a 22.50 pack-year smoking history. She has never used smokeless tobacco. She reports that she does not drink alcohol and does not use drugs.  ROS: A complete review of systems was performed.  All systems are negative except for pertinent findings as noted.  Physical Exam:  Vital signs in last 24 hours: @VSRANGES @ General:  Alert and oriented, No acute distress HEENT: Normocephalic, atraumatic Neck: No JVD or lymphadenopathy Cardiovascular: Regular rate  Lungs: Normal inspiratory/expiratory excursion Abdomen: Soft, nontender, nondistended, no abdominal masses Back: No CVA tenderness Extremities: No edema Neurologic: Grossly intact  I have reviewed prior pt notes  I have reviewed notes from referring/previous physicians  I have reviewed urinalysis results    Impression/Assessment:  Lower urinary tract symptoms.  I do  not think her daytime urinary symptoms were due to lack of oxybutynin as it is just a nightly dose  Plan:  As this was a limited issue, I have recommended that they try tapering off of the oxybutynin again.  I discussed with mom the fact that this is a short acting medication and that it was already cleared of her body by the time she had that issue  I will leave it up for him to call me for an appointment  Lillette Boxer Dax Murguia 12/12/2020, 8:30 AM  Lillette Boxer. Gildo Crisco MD

## 2020-12-14 DIAGNOSIS — R293 Abnormal posture: Secondary | ICD-10-CM | POA: Diagnosis not present

## 2020-12-14 DIAGNOSIS — R531 Weakness: Secondary | ICD-10-CM | POA: Diagnosis not present

## 2020-12-14 DIAGNOSIS — Z9181 History of falling: Secondary | ICD-10-CM | POA: Diagnosis not present

## 2020-12-14 DIAGNOSIS — R41841 Cognitive communication deficit: Secondary | ICD-10-CM | POA: Diagnosis not present

## 2020-12-14 DIAGNOSIS — M542 Cervicalgia: Secondary | ICD-10-CM | POA: Diagnosis not present

## 2020-12-19 DIAGNOSIS — Z9181 History of falling: Secondary | ICD-10-CM | POA: Diagnosis not present

## 2020-12-19 DIAGNOSIS — R293 Abnormal posture: Secondary | ICD-10-CM | POA: Diagnosis not present

## 2020-12-19 DIAGNOSIS — R41841 Cognitive communication deficit: Secondary | ICD-10-CM | POA: Diagnosis not present

## 2020-12-19 DIAGNOSIS — M542 Cervicalgia: Secondary | ICD-10-CM | POA: Diagnosis not present

## 2020-12-19 DIAGNOSIS — R531 Weakness: Secondary | ICD-10-CM | POA: Diagnosis not present

## 2020-12-21 ENCOUNTER — Other Ambulatory Visit (INDEPENDENT_AMBULATORY_CARE_PROVIDER_SITE_OTHER): Payer: Self-pay | Admitting: *Deleted

## 2020-12-21 ENCOUNTER — Other Ambulatory Visit: Payer: Self-pay | Admitting: Neurology

## 2020-12-21 DIAGNOSIS — M542 Cervicalgia: Secondary | ICD-10-CM | POA: Diagnosis not present

## 2020-12-21 DIAGNOSIS — R531 Weakness: Secondary | ICD-10-CM | POA: Diagnosis not present

## 2020-12-21 DIAGNOSIS — K219 Gastro-esophageal reflux disease without esophagitis: Secondary | ICD-10-CM

## 2020-12-21 DIAGNOSIS — R293 Abnormal posture: Secondary | ICD-10-CM | POA: Diagnosis not present

## 2020-12-21 DIAGNOSIS — R41841 Cognitive communication deficit: Secondary | ICD-10-CM | POA: Diagnosis not present

## 2020-12-21 DIAGNOSIS — Z9181 History of falling: Secondary | ICD-10-CM | POA: Diagnosis not present

## 2020-12-21 MED ORDER — OMEPRAZOLE 20 MG PO CPDR: 20.0000 mg | DELAYED_RELEASE_CAPSULE | Freq: Every day | ORAL | 3 refills | Status: DC

## 2020-12-21 MED ORDER — CLONAZEPAM 0.5 MG PO TABS: 0.5000 mg | ORAL_TABLET | Freq: Two times a day (BID) | ORAL | 0 refills | Status: DC

## 2020-12-21 NOTE — Telephone Encounter (Signed)
Pt's mother, Adrianna Dudas request refill clonazePAM (KLONOPIN) 0.5 MG tablet at [facility]

## 2020-12-21 NOTE — Telephone Encounter (Signed)
[place] Drug registry Verified LR:10/24/20 Qty: 180 for 90 days  Last OV:11/09/2020 Pending appointment:  11/12/21

## 2021-01-05 DIAGNOSIS — H21233 Degeneration of iris (pigmentary), bilateral: Secondary | ICD-10-CM | POA: Diagnosis not present

## 2021-01-05 DIAGNOSIS — Z961 Presence of intraocular lens: Secondary | ICD-10-CM | POA: Diagnosis not present

## 2021-01-05 DIAGNOSIS — H26493 Other secondary cataract, bilateral: Secondary | ICD-10-CM | POA: Diagnosis not present

## 2021-01-08 ENCOUNTER — Telehealth: Payer: Self-pay | Admitting: Neurology

## 2021-01-08 NOTE — Telephone Encounter (Signed)
Pt's mother is asking that clonazePAM (KLONOPIN) 0.5 MG tablet be called into [facility]

## 2021-01-08 NOTE — Telephone Encounter (Signed)
Pt has refills, LVM

## 2021-02-19 DIAGNOSIS — U071 COVID-19: Secondary | ICD-10-CM | POA: Insufficient documentation

## 2021-02-22 ENCOUNTER — Other Ambulatory Visit: Payer: Self-pay | Admitting: *Deleted

## 2021-02-22 DIAGNOSIS — R569 Unspecified convulsions: Secondary | ICD-10-CM

## 2021-02-22 MED ORDER — VIMPAT 200 MG PO TABS: 200.0000 mg | ORAL_TABLET | Freq: Two times a day (BID) | ORAL | 1 refills | Status: DC

## 2021-02-22 NOTE — Telephone Encounter (Signed)
Received a request from [place] for brand name Vimpat. Per the [place] narcotic registry, she has been getting brand name. I called [facility] [phone]) and they confirmed the same (spoke to [facility], Software engineer).

## 2021-03-13 DIAGNOSIS — R300 Dysuria: Secondary | ICD-10-CM

## 2021-03-13 DIAGNOSIS — R040 Epistaxis: Secondary | ICD-10-CM | POA: Diagnosis not present

## 2021-03-13 DIAGNOSIS — N390 Urinary tract infection, site not specified: Secondary | ICD-10-CM | POA: Diagnosis not present

## 2021-03-13 HISTORY — DX: Urinary tract infection, site not specified: N39.0

## 2021-03-13 HISTORY — DX: Dysuria: R30.0

## 2021-03-20 ENCOUNTER — Other Ambulatory Visit: Payer: Self-pay | Admitting: Neurology

## 2021-03-20 DIAGNOSIS — N958 Other specified menopausal and perimenopausal disorders: Secondary | ICD-10-CM | POA: Diagnosis not present

## 2021-03-20 DIAGNOSIS — M8588 Other specified disorders of bone density and structure, other site: Secondary | ICD-10-CM | POA: Diagnosis not present

## 2021-03-20 DIAGNOSIS — Z1231 Encounter for screening mammogram for malignant neoplasm of breast: Secondary | ICD-10-CM | POA: Diagnosis not present

## 2021-03-20 MED ORDER — CLONAZEPAM 0.5 MG PO TABS: 0.5000 mg | ORAL_TABLET | Freq: Two times a day (BID) | ORAL | 1 refills | Status: DC

## 2021-03-20 NOTE — Telephone Encounter (Signed)
Pt's mother, Lether Tesch request refill for clonazePAM (KLONOPIN) 0.5 MG tablet at [facility]

## 2021-03-26 ENCOUNTER — Other Ambulatory Visit: Payer: Self-pay | Admitting: Obstetrics and Gynecology

## 2021-03-26 DIAGNOSIS — R928 Other abnormal and inconclusive findings on diagnostic imaging of breast: Secondary | ICD-10-CM

## 2021-03-30 DIAGNOSIS — E559 Vitamin D deficiency, unspecified: Secondary | ICD-10-CM | POA: Diagnosis not present

## 2021-03-30 DIAGNOSIS — E079 Disorder of thyroid, unspecified: Secondary | ICD-10-CM | POA: Diagnosis not present

## 2021-04-13 ENCOUNTER — Other Ambulatory Visit: Payer: Medicare Other

## 2021-05-01 ENCOUNTER — Other Ambulatory Visit: Payer: Self-pay

## 2021-05-01 ENCOUNTER — Ambulatory Visit
Admission: RE | Admit: 2021-05-01 | Discharge: 2021-05-01 | Disposition: A | Discharge status: Home / Self Care | Payer: Medicare Other | Source: Ambulatory Visit | Attending: Obstetrics and Gynecology | Admitting: Obstetrics and Gynecology

## 2021-05-01 ENCOUNTER — Ambulatory Visit: Payer: Medicare Other

## 2021-05-01 DIAGNOSIS — R928 Other abnormal and inconclusive findings on diagnostic imaging of breast: Secondary | ICD-10-CM

## 2021-05-01 DIAGNOSIS — R922 Inconclusive mammogram: Secondary | ICD-10-CM | POA: Diagnosis not present

## 2021-05-03 ENCOUNTER — Other Ambulatory Visit: Payer: Medicare Other

## 2021-05-14 ENCOUNTER — Telehealth: Payer: Self-pay | Admitting: Neurology

## 2021-05-14 NOTE — Telephone Encounter (Signed)
I spoke to the patient's mother who received a call from [place] stating there was an issue with Vimpat. Upon further investigation, the medication needs a prior authorization.  PA for brand name Vimpat 200mg  completed over the phone with Rodriguez Hevia at [phone] (spoke to [place]). Case [id number] approved through 06/02/2098.  I have provided her mother with this update on her voicemail. Left our number to call back with any further questions.

## 2021-05-14 NOTE — Telephone Encounter (Signed)
Pt's mother, Jenafer Winterton (on [place]) request prescription sent to [facility]. [facility] no longer will fill that medication. Also need prescription to be 90-day supply.

## 2021-06-07 ENCOUNTER — Emergency Department (HOSPITAL_COMMUNITY)
Admission: EM | Admit: 2021-06-07 | Discharge: 2021-06-07 | Disposition: A | Discharge status: Home / Self Care | Payer: Medicare Other | Attending: Emergency Medicine | Admitting: Emergency Medicine

## 2021-06-07 ENCOUNTER — Other Ambulatory Visit (HOSPITAL_COMMUNITY): Payer: Self-pay | Admitting: Family Medicine

## 2021-06-07 ENCOUNTER — Other Ambulatory Visit: Payer: Self-pay

## 2021-06-07 ENCOUNTER — Emergency Department (HOSPITAL_COMMUNITY): Payer: Medicare Other

## 2021-06-07 ENCOUNTER — Encounter (HOSPITAL_COMMUNITY): Payer: Self-pay | Admitting: Emergency Medicine

## 2021-06-07 DIAGNOSIS — M25562 Pain in left knee: Secondary | ICD-10-CM | POA: Diagnosis not present

## 2021-06-07 MED ORDER — KETOROLAC TROMETHAMINE 30 MG/ML IJ SOLN
30.0000 mg | Freq: Once | INTRAMUSCULAR | Status: AC
  Administered 2021-06-07: 30 mg via INTRAMUSCULAR
  Filled 2021-06-07: qty 1

## 2021-06-07 NOTE — ED Triage Notes (Signed)
Left knee pain x 1 week. Denies injury

## 2021-06-07 NOTE — ED Provider Notes (Signed)
[facility] Provider Note   CSN: [id number] Arrival date & time: 06/07/21  1508     History  Chief Complaint  Patient presents with   Knee Pain    Maria [name] is a 61 y.o. female.  Past medical history includes seizure disorder, intellectual disability.  Patient presents emergency department with a chief complaint of left knee pain.  Pain is been going on for about 1 week.  Pain has been constant and is worse with mobility.  It has not gotten any worse, however but has been about the same level of pain.  She does not recall in a certain injury, however her son is with her today and states that he thinks he noticed the pain started when he had she had bent over suddenly.  I spoke with the patient's mom who is her primary caregiver and she states that she has been trying ibuprofen today with no relief.  She denies any fevers, chills, fatigue, myalgias.   Knee Pain Associated symptoms: no fever       Home Medications Prior to Admission medications   Medication Sig Start Date End Date Taking? Authorizing Provider  acetaminophen (TYLENOL) 500 MG tablet Take 1 tablet (500 mg total) by mouth every 6 (six) hours as needed for moderate pain. 05/05/20   Elgergawy, Silver Huguenin, MD  atorvastatin (LIPITOR) 20 MG tablet Take 20 mg by mouth daily. 08/24/18   [provider]  clonazePAM (KLONOPIN) 0.5 MG tablet Take 1 tablet (0.5 mg total) by mouth 2 (two) times daily. 03/20/21   Kathrynn Ducking, MD  divalproex (DEPAKOTE) 500 MG DR tablet TAKE 2 TABLETS (1,000 MG TOTAL) BY MOUTH DAILY. 10/23/20 10/23/21  Suzzanne Cloud, NP  lacosamide (VIMPAT) 200 MG TABS tablet Take 1 tablet (200 mg total) by mouth 2 (two) times daily. 07/31/20   Suzzanne Cloud, NP  levocetirizine (XYZAL) 5 MG tablet Take 5 mg by mouth every evening.    [provider]  omeprazole (PRILOSEC) 20 MG capsule Take 1 capsule (20 mg total) by mouth daily. 12/21/20   Harvel Quale, MD   ondansetron (ZOFRAN) 4 MG tablet Take 1 tablet (4 mg total) by mouth every 8 (eight) hours as needed. 07/14/20 07/14/21  Virl Cagey, MD  oxybutynin (DITROPAN) 5 MG tablet 1 po qhs 11/23/20   Franchot Gallo, MD  Pediatric Multiple Vit-C-FA (PEDIATRIC MULTIVITAMIN) chewable tablet Chew 1 tablet by mouth daily.    [provider]  VIMPAT 200 MG TABS tablet Take 1 tablet (200 mg total) by mouth 2 (two) times daily. 02/22/21   Sater, Nanine Means, MD  QUEtiapine (SEROQUEL) 25 MG tablet Take 1 tablet (25 mg total) by mouth 2 (two) times daily. 05/31/20 06/06/20  Angiulli, Lavon Paganini, PA-C      Allergies    Codeine and Lamictal [lamotrigine]    Review of Systems   Review of Systems  Constitutional:  Negative for fever.  Musculoskeletal:  Positive for arthralgias. Negative for joint swelling.  All other systems reviewed and are negative.  Physical Exam Updated Vital Signs BP (!) 148/73    Pulse 64    Temp 97.9 F (36.6 C) (Oral)    Resp 16    Ht 5\' 6"  (1.676 m)    Wt 65.8 kg    SpO2 100%    BMI 23.40 kg/m  Physical Exam Vitals and nursing note reviewed.  Constitutional:      General: She is not in acute distress.  Appearance: Normal appearance. She is well-developed. She is not ill-appearing, toxic-appearing or diaphoretic.  HENT:     Head: Normocephalic and atraumatic.     Nose: No nasal deformity.     Mouth/Throat:     Lips: Pink. No lesions.  Eyes:     General: Gaze aligned appropriately. No scleral icterus.       Right eye: No discharge.        Left eye: No discharge.     Conjunctiva/sclera: Conjunctivae normal.     Right eye: Right conjunctiva is not injected. No exudate or hemorrhage.    Left eye: Left conjunctiva is not injected. No exudate or hemorrhage. Pulmonary:     Effort: Pulmonary effort is normal. No respiratory distress.  Musculoskeletal:     Comments: Left knee is without any erythema or notable swelling.  She has full range of motion of left knee.   Pedal and tibial dorsalis pulses 2+ bilaterally.  Sensation is grossly intact of lower extremities.  Patient has normal gait.  Skin:    General: Skin is warm and dry.  Neurological:     Mental Status: She is alert and oriented to person, place, and time.  Psychiatric:        Mood and Affect: Mood normal.        Speech: Speech normal.        Behavior: Behavior normal. Behavior is cooperative.    ED Results / Procedures / Treatments   Labs (all labs ordered are listed, but only abnormal results are displayed) Labs Reviewed - No data to display  EKG None  Radiology DG Knee Complete 4 Views Left  Result Date: 06/07/2021 CLINICAL DATA:  Left medial and lateral pain, pain with ambulation, symptoms for 1 week EXAM: LEFT KNEE - COMPLETE 4+ VIEW COMPARISON:  10/10/2020 FINDINGS: Frontal, bilateral oblique, lateral views of the left knee are obtained. No fracture, subluxation, or dislocation. Joint spaces are well preserved. No joint effusion. Soft tissues are unremarkable. IMPRESSION: 1. Unremarkable left knee. Electronically Signed   By: Randa Ngo M.D.   On: 06/07/2021 16:38    Procedures Procedures   Medications Ordered in ED Medications  ketorolac (TORADOL) 30 MG/ML injection 30 mg (30 mg Intramuscular Given 06/07/21 1733)    ED Course/ Medical Decision Making/ A&P                           Medical Decision Making Problems Addressed: Left knee pain, unspecified chronicity: chronic illness or injury with exacerbation, progression, or side effects of treatment    Details: Referral to orthopedics  Amount and/or Complexity of Data Reviewed Independent Historian: guardian    Details: patient is with son. I spoke to patient's mother on the phone Radiology: ordered and independent interpretation performed. Decision-making details documented in ED Course.  Risk OTC drugs.   This is a 61 y.o. female with a PMH of seizure disorder, intellectual disability who presents to the ED  with left knee pain.   Spoke with patient's primary caregiver regarding symptoms. It sounds like this left knee pain may have been going on for longer than patient states. She has only tried Ibuprofen x 1 today with no relief.   There is no obvious joint swelling or erythema. No notable effusion. She does have point tenderness of the medial side of joint. No fevers or vital sign abnormalities to suggest a septic joint. This could be an inflammatory bursitis or development of arthritis.  XR is negative for fracture.  I feel that patient would benefit from an outpatient orthopedic referral for consideration of joint injection and further workup of patient's symptoms.    Portions of this note were generated with [employer]. Dictation errors may occur despite best attempts at proofreading.   Final Clinical Impression(s) / ED Diagnoses Final diagnoses:  Left knee pain, unspecified chronicity    Rx / DC Orders ED Discharge Orders     None         Adolphus Birchwood, PA-C 06/07/21 2318    Milton Ferguson, MD 06/08/21 1105

## 2021-06-07 NOTE — Discharge Instructions (Addendum)
I have provided you with an orthopedic referral with Dr. Aline Brochure listed in your discharge paperwork.  I recommend taking Ibuprofen 600 mg every 8 hours at home for no longer than two weeks Please return to the emergency department if you develop worsening symptoms such as fevers, worsening joint swelling or redness of the left knee.

## 2021-07-09 DIAGNOSIS — Z20822 Contact with and (suspected) exposure to covid-19: Secondary | ICD-10-CM | POA: Diagnosis not present

## 2021-08-15 ENCOUNTER — Other Ambulatory Visit: Payer: Self-pay

## 2021-08-15 ENCOUNTER — Other Ambulatory Visit (HOSPITAL_COMMUNITY): Payer: Self-pay | Admitting: Family Medicine

## 2021-08-15 ENCOUNTER — Ambulatory Visit (HOSPITAL_COMMUNITY)
Admission: RE | Admit: 2021-08-15 | Discharge: 2021-08-15 | Disposition: A | Discharge status: Home / Self Care | Payer: Medicare Other | Source: Ambulatory Visit | Attending: Family Medicine | Admitting: Family Medicine

## 2021-08-15 DIAGNOSIS — M25511 Pain in right shoulder: Secondary | ICD-10-CM

## 2021-08-15 DIAGNOSIS — M79601 Pain in right arm: Secondary | ICD-10-CM

## 2021-08-15 DIAGNOSIS — M25531 Pain in right wrist: Secondary | ICD-10-CM

## 2021-08-15 DIAGNOSIS — M898X1 Other specified disorders of bone, shoulder: Secondary | ICD-10-CM | POA: Insufficient documentation

## 2021-08-16 ENCOUNTER — Ambulatory Visit (HOSPITAL_COMMUNITY)
Admission: RE | Admit: 2021-08-16 | Discharge: 2021-08-16 | Disposition: A | Discharge status: Home / Self Care | Payer: Medicare Other | Source: Ambulatory Visit | Attending: Family Medicine | Admitting: Family Medicine

## 2021-08-16 ENCOUNTER — Other Ambulatory Visit (HOSPITAL_COMMUNITY): Payer: Self-pay | Admitting: Family Medicine

## 2021-08-16 DIAGNOSIS — M25519 Pain in unspecified shoulder: Secondary | ICD-10-CM | POA: Insufficient documentation

## 2021-08-16 DIAGNOSIS — M25511 Pain in right shoulder: Secondary | ICD-10-CM | POA: Diagnosis not present

## 2021-08-23 ENCOUNTER — Ambulatory Visit (INDEPENDENT_AMBULATORY_CARE_PROVIDER_SITE_OTHER): Payer: Medicare Other | Admitting: Orthopaedic Surgery

## 2021-08-23 ENCOUNTER — Other Ambulatory Visit: Payer: Self-pay

## 2021-08-23 ENCOUNTER — Encounter: Payer: Self-pay | Admitting: Orthopaedic Surgery

## 2021-08-23 ENCOUNTER — Ambulatory Visit (INDEPENDENT_AMBULATORY_CARE_PROVIDER_SITE_OTHER): Payer: Medicare Other

## 2021-08-23 ENCOUNTER — Telehealth: Payer: Self-pay | Admitting: Neurology

## 2021-08-23 VITALS — Ht 66.0 in | Wt 145.0 lb

## 2021-08-23 DIAGNOSIS — R569 Unspecified convulsions: Secondary | ICD-10-CM

## 2021-08-23 DIAGNOSIS — R0781 Pleurodynia: Secondary | ICD-10-CM

## 2021-08-23 DIAGNOSIS — M25511 Pain in right shoulder: Secondary | ICD-10-CM

## 2021-08-23 MED ORDER — DIVALPROEX SODIUM 500 MG PO DR TAB: DELAYED_RELEASE_TABLET | ORAL | 3 refills | Status: DC

## 2021-08-23 MED ORDER — LACOSAMIDE 200 MG PO TABS: 200.0000 mg | ORAL_TABLET | Freq: Two times a day (BID) | ORAL | 1 refills | Status: DC

## 2021-08-23 NOTE — Telephone Encounter (Signed)
Pt is requesting a refill for VIMPAT 200 MG TABS tablet, clonazePAM (KLONOPIN) 0.5 MG tablet,divalproex (DEPAKOTE) 500 MG DR tablet . ? ?Pharmacy:  [facility]  ? ?

## 2021-08-23 NOTE — Progress Notes (Signed)
? ?Office Visit Note ?  ?Patient: Maria [name]           ?Date of Birth: April 20, 1961           ?MRN: [id number] ?Visit Date: 08/23/2021 ?             ?Requested by: Celene Squibb, MD ?[address] ?[address] ?[place],  [place] [address] ?PCP: Celene Squibb, MD ? ? ?Assessment & Plan: ?Visit Diagnoses:  ?1. Rib pain on right side   ? ? ?Plan: On repeat imaging no evidence of distal clavicle fracture.  Maria [name] does not have any ecchymosis present.  No evidence of rib fracture and lung appears fully inflated no pneumothorax.  Conservative treatment recommended.  Maria [name] is already gotten some improvement in the last 10 days and should continue to prove fall prevention discussed. ? ?Follow-Up Instructions: No follow-ups on file.  ? ?Orders:  ?Orders Placed This Encounter  ?Procedures  ? XR Chest 2 View  ? XR Ribs Unilateral Right  ? ?No orders of the defined types were placed in this encounter. ? ? ? ? Procedures: ?No procedures performed ? ? ?Clinical Data: ?No additional findings. ? ? ?Subjective: ?Chief Complaint  ?Patient presents with  ? Middle Back - Pain  ? Right Shoulder - Pain  ? ? ?HPI 61 year old female had 2 falls Maria [name] is here with her mother.  Maria [name] has epilepsy history of acute encephalopathy converses with partial and sometimes short full sentences.  X-rays have been obtained which showed questionable distal clavicle nondisplaced fracture.  Maria [name] has had some soreness over her scapula.  Maria [name] switched to a strap was brought.  X-ray of the wrist were negative.  Clavicle x-rays were read as suspicious for distal clavicle fracture. ? ?Review of Systems all the systems are noncontributory to HPI. ? ? ?Objective: ?Vital Signs: Ht '5\' 6"'$  (1.676 m)   Wt 145 lb (65.8 kg)   BMI 23.40 kg/m?  ? ?Physical Exam ?Constitutional:   ?   Appearance: Maria [name] is well-developed.  ?HENT:  ?   Head: Normocephalic.  ?   Right Ear: External ear normal.  ?   Left Ear: External ear normal. There is no impacted cerumen.  ?Eyes:  ?   Pupils: Pupils are equal,  round, and reactive to light.  ?Neck:  ?   Thyroid: No thyromegaly.  ?   Trachea: No tracheal deviation.  ?Cardiovascular:  ?   Rate and Rhythm: Normal rate.  ?Pulmonary:  ?   Effort: Pulmonary effort is normal.  ?Abdominal:  ?   Palpations: Abdomen is soft.  ?Musculoskeletal:  ?   Cervical back: No rigidity.  ?Skin: ?   General: Skin is warm and dry.  ?Neurological:  ?   Mental Status: Maria [name] is alert and oriented to person, place, and time.  ?Psychiatric:     ?   Behavior: Behavior normal.  ? ? ?Ortho Exam patient has scratch marks over her arms back.  Tenderness over the right scapula without winging.  Distal clavicle shows no ecchymosis on the right Maria [name] does have slight tenderness over the [name] joint but not tender as would be expected with 10 days post distal clavicle fracture.  Sensation hand is intact. ? ?Specialty Comments:  ?No specialty comments available. ? ?Imaging: ?No results found. ? ? ?PMFS History: ?Patient Active Problem List  ? Diagnosis Date Noted  ? Pain in right shoulder 08/24/2021  ? ESBL (extended spectrum beta-lactamase) producing bacteria infection 05/12/2020  ? Bacteremia due  to Escherichia coli 05/12/2020  ? Severe sepsis ([place]) 05/12/2020  ? Debility 05/05/2020  ? Pressure injury of skin 04/24/2020  ? Palliative care by specialist   ? Pyelonephritis of left kidney 04/14/2020  ? Renal abscess, left 04/14/2020  ? Leukocytosis 04/14/2020  ? Fever 04/14/2020  ? LLQ abdominal pain 04/14/2020  ? AKI (acute kidney injury) ([place])   ? Sepsis secondary to UTI [facility])   ? Pain in left ankle and joints of left foot 12/02/2019  ? Positive colorectal cancer screening using Cologuard test 10/28/2018  ? Posterior tibial tendinitis, right leg 04/23/2016  ? Pain in right foot 04/12/2016  ? Acute encephalopathy   ? Myoclonus   ? Frequent falls 11/09/2015  ? Fall 11/09/2015  ? Chest pain 03/16/2014  ? Upper abdominal pain 03/16/2014  ? Seizure disorder ([place]) 03/16/2014  ? 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 Generalized convulsive epilepsy ([place]) 10/06/2012  ? DNR (do not resuscitate) discussion 10/06/2012  ? Intellectual disability   ? Thrombocytopenia ([place]) 05/23/2011  ? Seizures ([place]) 03/21/2011  ? GERD (gastroesophageal reflux disease) 03/21/2011  ? CLOSED FRACTURE OF ACROMIAL END OF CLAVICLE 07/13/2008  ? ?Past Medical History:  ?Diagnosis Date  ? Constipation   ? Dysphagia   ? Fracture   ? R foot  ? GERD (gastroesophageal reflux disease)   ? History of shingles 10/2016  ? Mental retardation   ? lesion in head  ? Seizures ([place])   ? "not fully controlled on max doses of meds" (Neuro ofc note 12/2014)  ? Thrombocytopenia ([place])   ? Tremor   ?  ?Family History  ?Problem Relation Age of Onset  ? High Cholesterol Mother   ? High blood pressure Mother   ? Diabetes Father   ?  ?Past Surgical History:  ?Procedure Laterality Date  ? BIOPSY  09/16/2014  ? Procedure: BIOPSY;  Surgeon: Rogene Houston, MD;  Location: [facility];  Service: Endoscopy;;  ? CATARACT EXTRACTION    ? both eyes, May of 2015  ? COLONOSCOPY    ? COLONOSCOPY WITH PROPOFOL N/A 11/20/2018  ? Procedure: COLONOSCOPY WITH PROPOFOL;  Surgeon: Rogene Houston, MD;  [facility];  Service: Endoscopy;  Laterality: N/A;  ? ESOPHAGEAL DILATION N/A 09/16/2014  ? Procedure: ESOPHAGEAL DILATION WITH 54FR MALONEY DILATOR;  Surgeon: Rogene Houston, MD;  Location: [facility];  Service: Endoscopy;  Laterality: N/A;  ? ESOPHAGOGASTRODUODENOSCOPY (EGD) WITH PROPOFOL N/A 09/16/2014  ? Procedure: ESOPHAGOGASTRODUODENOSCOPY (EGD) WITH PROPOFOL;  Surgeon: Rogene Houston, MD;  Location: [facility];  Service: Endoscopy;  Laterality: N/A;  ? LAPAROSCOPIC APPENDECTOMY N/A 07/14/2020  ? Procedure: APPENDECTOMY LAPAROSCOPIC;  Surgeon: Virl Cagey, MD;  Location: [facility];  Service: General;  Laterality: N/A;  ? MOUTH SURGERY    ? POLYPECTOMY  11/20/2018  ? Procedure: POLYPECTOMY;  Surgeon: Rogene Houston, MD;  [facility];  Service: Endoscopy;;  colon  ? Skin graft to  gum Right 08/2013  ? TONSILLECTOMY AND ADENOIDECTOMY    ? TOTAL ABDOMINAL HYSTERECTOMY    ? ?Social History  ? ?Occupational History  ?  Employer: UNEMPLOYED  ?Tobacco Use  ? Smoking status: Former  ?  Packs/day: 1.50  ?  Years: 15.00  ?  Pack years: 22.50  ?  Types: Cigarettes  ?  Quit date: 05/22/2006  ?  Years since quitting: 15.2  ? Smokeless tobacco: Never  ?Vaping Use  ? Vaping Use: Never used  ?Substance and Sexual Activity  ?  Alcohol use: No  ?  Alcohol/week: 0.0 standard drinks  ? Drug use: No  ? Sexual activity: Never  ?  Birth control/protection: Other-see comments  ?  Comment: Hysterectomy  ? ? ? ? ? ? ?

## 2021-08-23 NOTE — Addendum Note (Signed)
Addended by: Verlin Grills on: 08/23/2021 03:43 PM ? ? Modules accepted: Orders ? ?

## 2021-08-23 NOTE — Telephone Encounter (Addendum)
I have refilled Depakote. ? ?Checked  drug registry, Maria [name] was refilled on 07/12/21 # 180 for a 90 day supply.  ?Rx not due at this time. ? ?Vimpat was refilled on 05/16/21 # 180 for a 90 day supply; no additional refills. ? ?Last appt was 11/09/20 and f/u on the books for 11/14/21.  ?

## 2021-08-24 DIAGNOSIS — M25511 Pain in right shoulder: Secondary | ICD-10-CM | POA: Insufficient documentation

## 2021-08-24 HISTORY — DX: Pain in right shoulder: M25.511

## 2021-08-28 ENCOUNTER — Telehealth: Payer: Self-pay | Admitting: Neurology

## 2021-08-28 ENCOUNTER — Emergency Department (HOSPITAL_COMMUNITY)
Admission: EM | Admit: 2021-08-28 | Discharge: 2021-08-28 | Disposition: A | Discharge status: Home / Self Care | Payer: Medicare Other | Attending: Emergency Medicine | Admitting: Emergency Medicine

## 2021-08-28 ENCOUNTER — Emergency Department (HOSPITAL_COMMUNITY): Payer: Medicare Other

## 2021-08-28 ENCOUNTER — Other Ambulatory Visit: Payer: Self-pay

## 2021-08-28 ENCOUNTER — Encounter (HOSPITAL_COMMUNITY): Payer: Self-pay | Admitting: Emergency Medicine

## 2021-08-28 DIAGNOSIS — Y92002 Bathroom of unspecified non-institutional (private) residence single-family (private) house as the place of occurrence of the external cause: Secondary | ICD-10-CM | POA: Diagnosis not present

## 2021-08-28 DIAGNOSIS — R58 Hemorrhage, not elsewhere classified: Secondary | ICD-10-CM | POA: Diagnosis not present

## 2021-08-28 DIAGNOSIS — R52 Pain, unspecified: Secondary | ICD-10-CM | POA: Diagnosis not present

## 2021-08-28 DIAGNOSIS — M2578 Osteophyte, vertebrae: Secondary | ICD-10-CM | POA: Diagnosis not present

## 2021-08-28 DIAGNOSIS — S060X9A Concussion with loss of consciousness of unspecified duration, initial encounter: Secondary | ICD-10-CM | POA: Diagnosis not present

## 2021-08-28 DIAGNOSIS — S060XAA Concussion with loss of consciousness status unknown, initial encounter: Secondary | ICD-10-CM | POA: Diagnosis not present

## 2021-08-28 DIAGNOSIS — Z23 Encounter for immunization: Secondary | ICD-10-CM | POA: Diagnosis not present

## 2021-08-28 DIAGNOSIS — S161XXA Strain of muscle, fascia and tendon at neck level, initial encounter: Secondary | ICD-10-CM | POA: Diagnosis not present

## 2021-08-28 DIAGNOSIS — R519 Headache, unspecified: Secondary | ICD-10-CM | POA: Diagnosis not present

## 2021-08-28 DIAGNOSIS — S0121XA Laceration without foreign body of nose, initial encounter: Secondary | ICD-10-CM | POA: Insufficient documentation

## 2021-08-28 DIAGNOSIS — S0003XA Contusion of scalp, initial encounter: Secondary | ICD-10-CM | POA: Diagnosis not present

## 2021-08-28 DIAGNOSIS — W19XXXA Unspecified fall, initial encounter: Secondary | ICD-10-CM | POA: Diagnosis not present

## 2021-08-28 DIAGNOSIS — M542 Cervicalgia: Secondary | ICD-10-CM | POA: Diagnosis not present

## 2021-08-28 DIAGNOSIS — S0083XA Contusion of other part of head, initial encounter: Secondary | ICD-10-CM | POA: Diagnosis not present

## 2021-08-28 DIAGNOSIS — W182XXA Fall in (into) shower or empty bathtub, initial encounter: Secondary | ICD-10-CM | POA: Diagnosis not present

## 2021-08-28 DIAGNOSIS — S0990XA Unspecified injury of head, initial encounter: Secondary | ICD-10-CM | POA: Diagnosis not present

## 2021-08-28 DIAGNOSIS — R079 Chest pain, unspecified: Secondary | ICD-10-CM | POA: Diagnosis not present

## 2021-08-28 DIAGNOSIS — R9431 Abnormal electrocardiogram [ECG] [EKG]: Secondary | ICD-10-CM | POA: Diagnosis not present

## 2021-08-28 LAB — URINALYSIS, ROUTINE W REFLEX MICROSCOPIC
Bacteria, UA: NONE SEEN
Bilirubin Urine: NEGATIVE
Glucose, UA: NEGATIVE mg/dL
Ketones, ur: 5 mg/dL — AB
Nitrite: NEGATIVE
Protein, ur: NEGATIVE mg/dL
Specific Gravity, Urine: 1.02 (ref 1.005–1.030)
pH: 8 (ref 5.0–8.0)

## 2021-08-28 LAB — CBC WITH DIFFERENTIAL/PLATELET
Abs Immature Granulocytes: 0.04 10*3/uL (ref 0.00–0.07)
Basophils Absolute: 0 10*3/uL (ref 0.0–0.1)
Basophils Relative: 0 %
Eosinophils Absolute: 0.2 10*3/uL (ref 0.0–0.5)
Eosinophils Relative: 3 %
HCT: 35.1 % — ABNORMAL LOW (ref 36.0–46.0)
Hemoglobin: 11.6 g/dL — ABNORMAL LOW (ref 12.0–15.0)
Immature Granulocytes: 1 %
Lymphocytes Relative: 48 %
Lymphs Abs: 3.2 10*3/uL (ref 0.7–4.0)
MCH: 30.7 pg (ref 26.0–34.0)
MCHC: 33 g/dL (ref 30.0–36.0)
MCV: 92.9 fL (ref 80.0–100.0)
Monocytes Absolute: 0.7 10*3/uL (ref 0.1–1.0)
Monocytes Relative: 10 %
Neutro Abs: 2.5 10*3/uL (ref 1.7–7.7)
Neutrophils Relative %: 38 %
Platelets: 139 10*3/uL — ABNORMAL LOW (ref 150–400)
RBC: 3.78 MIL/uL — ABNORMAL LOW (ref 3.87–5.11)
RDW: 13.5 % (ref 11.5–15.5)
WBC: 6.7 10*3/uL (ref 4.0–10.5)
nRBC: 0 % (ref 0.0–0.2)

## 2021-08-28 LAB — BASIC METABOLIC PANEL
Anion gap: 8 (ref 5–15)
BUN: 23 mg/dL — ABNORMAL HIGH (ref 6–20)
CO2: 25 mmol/L (ref 22–32)
Calcium: 9.5 mg/dL (ref 8.9–10.3)
Chloride: 109 mmol/L (ref 98–111)
Creatinine, Ser: 0.95 mg/dL (ref 0.44–1.00)
GFR, Estimated: >60 mL/min (ref >60)
Glucose, Bld: 107 mg/dL — ABNORMAL HIGH (ref 70–99)
Potassium: 3.9 mmol/L (ref 3.5–5.1)
Sodium: 142 mmol/L (ref 135–145)

## 2021-08-28 LAB — VALPROIC ACID LEVEL: Valproic Acid Lvl: 90 ug/mL (ref 50.0–100.0)

## 2021-08-28 MED ORDER — TETANUS-DIPHTH-ACELL PERTUSSIS 5-2.5-18.5 LF-MCG/0.5 IM SUSY
0.5000 mL | PREFILLED_SYRINGE | Freq: Once | INTRAMUSCULAR | Status: AC
  Administered 2021-08-28: 0.5 mL via INTRAMUSCULAR
  Filled 2021-08-28: qty 0.5

## 2021-08-28 NOTE — Telephone Encounter (Signed)
In ER today for accidental fall, Depakote level was 90, HGB 11.6, stable CT head c-spine. I think she could benefit from re-eval in our office, looking at medication, make sure no seizures related to falls. She has history of frequent falls.  ? ?Currently taking Vimpat 200 mg twice daily, Depakote DR 500 mg 2 tablets daily ?Clonazepam 0.5 mg twice daily ? ?EEG 04/20/2020 ?IMPRESSION: ?This study showed two seizures at 1042 and 1045 during which patient was noted to have sudden arm and leg elevation followed by jerking for 2 to 3 seconds with generalized onset.  Additionally there is evidence of severe diffuse encephalopathy, likely secondary to seizures. ?

## 2021-08-28 NOTE — Telephone Encounter (Signed)
Pt's mother Eddie North said Pt has fallen 3 times in the last few weeks. Asking if the divalproex (DEPAKOTE) 500 MG DR tablet is effecting her. Would like a call from the nurse. ?

## 2021-08-28 NOTE — ED Notes (Signed)
Pt wants c-collar off. Educated why pt is wearing c-collar and that pt will need the  edp to evaluate first before can be removed ?

## 2021-08-28 NOTE — Telephone Encounter (Signed)
I called patient's mother. She reports that patient had a fall today and went to the ER. ER recommended neuro follow up. Mother is wondering if depakote could be causing her falls. It appears that her falls are a chronic issues and that patient has been on depakote for years. I spoke with Judson Roch, NP and she will discuss this further with Dr. April Manson. ?

## 2021-08-28 NOTE — ED Notes (Signed)
ED Provider at bedside. 

## 2021-08-28 NOTE — ED Triage Notes (Signed)
Pt fell in bathroom and c/o right sided neck pain. C-collar in place. Pt has hematoma to the right forehead and laceration to the bridge of nose.  ?

## 2021-08-28 NOTE — ED Provider Notes (Signed)
?[facility] ?Provider Note ? ? ?CSN: [id number] ?Arrival date & time: 08/28/21  0416 ? ?  ? ?History ? ?Chief Complaint  ?Patient presents with  ? Fall  ? ? ?Maria [name] is a 61 y.o. female. ? ?The history is provided by the patient and a parent.  ?Fall ?This is a new problem. The problem has not changed since onset.Associated symptoms include headaches. Pertinent negatives include no chest pain and no abdominal pain. The symptoms are aggravated by walking. The symptoms are relieved by rest.  ?Patient history of seizure disorder, intellectual delay presents after a fall.  Patient reports she was attempting to go to the restroom when she fell down.  She reports hitting her head has a headache, neck pain and facial pain.  Denies LOC.  She is not on anticoagulation ?  ?Patient is accompanied by her mother whom she lives with.  Mother reports patient had at least 3 falls in the last several weeks.  It is unknown if she had a seizure tonight. ?She has had previous clavicle fractures due to falls ? ?Past Medical History:  ?Diagnosis Date  ? Constipation   ? Dysphagia   ? Fracture   ? R foot  ? GERD (gastroesophageal reflux disease)   ? History of shingles 10/2016  ? Mental retardation   ? lesion in head  ? Seizures ([place])   ? "not fully controlled on max doses of meds" (Neuro ofc note 12/2014)  ? Thrombocytopenia ([place])   ? Tremor   ? ? ?Home Medications ?Prior to Admission medications   ?Medication Sig Start Date End Date Taking? Authorizing Provider  ?acetaminophen (TYLENOL) 500 MG tablet Take 1 tablet (500 mg total) by mouth every 6 (six) hours as needed for moderate pain. 05/05/20   Elgergawy, Silver Huguenin, MD  ?atorvastatin (LIPITOR) 20 MG tablet Take 20 mg by mouth daily. 08/24/18   [provider]  ?clonazePAM (KLONOPIN) 0.5 MG tablet Take 1 tablet (0.5 mg total) by mouth 2 (two) times daily. 03/20/21   Kathrynn Ducking, MD  ?divalproex (DEPAKOTE) 500 MG DR tablet TAKE 2 TABLETS (1,000 MG  TOTAL) BY MOUTH DAILY. 08/23/21 08/23/22  Suzzanne Cloud, NP  ?lacosamide (VIMPAT) 200 MG TABS tablet Take 1 tablet (200 mg total) by mouth 2 (two) times daily. 08/23/21   Suzzanne Cloud, NP  ?levocetirizine (XYZAL) 5 MG tablet Take 5 mg by mouth every evening.    [provider]  ?omeprazole (PRILOSEC) 20 MG capsule Take 1 capsule (20 mg total) by mouth daily. 12/21/20   Harvel Quale, MD  ?oxybutynin (DITROPAN) 5 MG tablet 1 po qhs 11/23/20   Franchot Gallo, MD  ?Pediatric Multiple Vit-C-FA (PEDIATRIC MULTIVITAMIN) chewable tablet Chew 1 tablet by mouth daily.    [provider]  ?VIMPAT 200 MG TABS tablet Take 1 tablet (200 mg total) by mouth 2 (two) times daily. 02/22/21   Sater, Nanine Means, MD  ?QUEtiapine (SEROQUEL) 25 MG tablet Take 1 tablet (25 mg total) by mouth 2 (two) times daily. 05/31/20 06/06/20  Angiulli, Lavon Paganini, PA-C  ?   ? ?Allergies    ?Codeine and Lamictal [lamotrigine]   ? ?Review of Systems   ?Review of Systems  ?Constitutional:  Negative for fever.  ?Cardiovascular:  Negative for chest pain.  ?Gastrointestinal:  Negative for abdominal pain.  ?Musculoskeletal:  Positive for neck pain.  ?Skin:  Positive for wound.  ?Neurological:  Positive for headaches.  ? ?Physical Exam ?Updated Vital  Signs ?BP (!) 180/70   Pulse 78   Temp 97.8 ?F (36.6 ?C) (Oral)   Resp 16   Ht 1.676 m ('5\' 6"'$ )   Wt 65.8 kg   SpO2 99%   BMI 23.41 kg/m?  ?Physical Exam ?CONSTITUTIONAL: Chronically ill-appearing ?HEAD: Hematoma noted to forehead ?EYES: EOMI/PERRL ?ENMT: Mucous membranes moist ?Small laceration to bridge of nose.  No septal hematoma.  No obvious facial deformities ?NECK: Cervical collar in place ?SPINE/BACK: Cervical spine tenderness noted, no thoracic or lumbar tenderness ?No bruising/crepitance/stepoffs noted to spine ?CV: S1/S2 noted, no murmurs/rubs/gallops noted ?LUNGS: Lungs are clear to auscultation bilaterally, no apparent distress ?ABDOMEN: soft, nontender ?GU:no cva  tenderness ?NEURO: Pt is awake/alert/appropriate, moves all extremitiesx4.  No facial droop.  No arm or leg drift ?EXTREMITIES: pulses normal/equal, full ROM ?All other extremities/joints palpated/ranged and nontender ?SKIN: warm, petechiae noted throughout the body ?PSYCH: Anxious ?ED Results / Procedures / Treatments   ?Labs ?(all labs ordered are listed, but only abnormal results are displayed) ?Labs Reviewed  ?BASIC METABOLIC PANEL - Abnormal; Notable for the following components:  ?    Result Value  ? Glucose, Bld 107 (*)   ? BUN 23 (*)   ? All other components within normal limits  ?CBC WITH DIFFERENTIAL/PLATELET - Abnormal; Notable for the following components:  ? RBC 3.78 (*)   ? Hemoglobin 11.6 (*)   ? HCT 35.1 (*)   ? Platelets 139 (*)   ? All other components within normal limits  ?URINALYSIS, ROUTINE W REFLEX MICROSCOPIC - Abnormal; Notable for the following components:  ? Hgb urine dipstick SMALL (*)   ? Ketones, ur 5 (*)   ? Leukocytes,Ua TRACE (*)   ? All other components within normal limits  ?VALPROIC ACID LEVEL  ? ? ?EKG ?EKG Interpretation ? ?Date/Time:  Tuesday August 28 2021 04:21:20 EDT ?Ventricular Rate:  72 ?PR Interval:  127 ?QRS Duration: 132 ?QT Interval:  415 ?QTC Calculation: 455 ?R Axis:   72 ?Text Interpretation: Sinus rhythm Right bundle branch block No significant change since last tracing Confirmed by Ripley Fraise [phone]) on 08/28/2021 4:35:27 AM ? ?Radiology ?CT Head Wo Contrast ? ?Result Date: 08/28/2021 ?CLINICAL DATA:  61 year old female status post fall in bathroom. Right side pain. Right forehead hematoma, bridge of nose laceration. EXAM: CT HEAD WITHOUT CONTRAST TECHNIQUE: Contiguous axial images were obtained from the base of the skull through the vertex without intravenous contrast. RADIATION DOSE REDUCTION: This exam was performed according to the departmental dose-optimization program which includes automated exposure control, adjustment of the mA and/or kV according to  patient size and/or use of iterative reconstruction technique. COMPARISON:  Face CT today reported separately. Brain MRI and head CT 10/10/2020. FINDINGS: Brain: Chronically advanced cerebellar volume loss. Stable cerebral volume. Chronic right inferior frontal gyrus encephalomalacia. No superimposed No midline shift, ventriculomegaly, mass effect, evidence of mass lesion, intracranial hemorrhage or evidence of cortically based acute infarction. Stable gray-white matter differentiation throughout the brain. Vascular: Mild Calcified atherosclerosis at the skull base. No suspicious intracranial vascular hyperdensity. Skull: Chronic hyperostosis of the calvarium. No acute osseous abnormality identified. Benign right frontal bone osseous hemangioma suspected and stable. Sinuses/Orbits: Trace mastoid fluid and chronic right mastoid sclerosis appears stable from last year. Visible paranasal sinuses remain well aerated. Other: Right forehead scalp hematoma (series 3, image 41). Intact underlying frontal bone with hyperostosis frontalis (normal variant). Orbits soft tissues appears stable and negative. IMPRESSION: 1. Right forehead scalp hematoma without underlying skull fracture. 2. No acute  intracranial abnormality. Stable non contrast CT appearance of the brain. Right inferior frontal gyrus encephalomalacia and chronically advanced cerebellar volume loss. Cerebral Atrophy (ICD10-G31.9). Electronically Signed   By: Genevie Ann M.D.   On: 08/28/2021 06:24  ? ?CT Cervical Spine Wo Contrast ? ?Result Date: 08/28/2021 ?CLINICAL DATA:  61 year old female status post fall in bathroom. Right side pain. Right forehead hematoma, bridge of nose laceration. EXAM: CT CERVICAL SPINE WITHOUT CONTRAST TECHNIQUE: Multidetector CT imaging of the cervical spine was performed without intravenous contrast. Multiplanar CT image reconstructions were also generated. RADIATION DOSE REDUCTION: This exam was performed according to the departmental  dose-optimization program which includes automated exposure control, adjustment of the mA and/or kV according to patient size and/or use of iterative reconstruction technique. COMPARISON:  Face CT today.  Cervical sp

## 2021-08-28 NOTE — ED Notes (Signed)
Ice pack given to pt.

## 2021-08-28 NOTE — ED Notes (Signed)
Patient transported to CT 

## 2021-08-28 NOTE — ED Notes (Addendum)
Pt ambulated down the hall to the bathroom with minimal assist. Denies any lightheadedness or dizziness ?

## 2021-08-29 NOTE — Telephone Encounter (Signed)
I called the pt's mom (Maria [name]) and I have added the pt to Dr. Jabier Mutton schedule. Pt has been scheduled for 09/04/2021 at 145 pm. ?

## 2021-08-29 NOTE — Telephone Encounter (Signed)
Per SS, NP of to scheduled follow up appt with Dr. April Manson due to increased falls and hx of seizures. Will call the pt to set up.  ?

## 2021-08-29 NOTE — Telephone Encounter (Signed)
Maria Ran, MD  Suzzanne Cloud, NP 15 hours ago (5:18 PM)  ? ?AC ?I will happy to see her. Just have the staff add her to my schedule.  ? ?Thanks    ?I called the pt's mom (jean) and I have added the pt to Dr. Jabier Mutton schedule. Pt has been scheduled for 09/04/2021 at 145 pm. ? ?

## 2021-08-31 DIAGNOSIS — Z20822 Contact with and (suspected) exposure to covid-19: Secondary | ICD-10-CM | POA: Diagnosis not present

## 2021-09-04 ENCOUNTER — Encounter: Payer: Self-pay | Admitting: Neurology

## 2021-09-04 ENCOUNTER — Ambulatory Visit (INDEPENDENT_AMBULATORY_CARE_PROVIDER_SITE_OTHER): Payer: Medicare Other | Admitting: Neurology

## 2021-09-04 VITALS — BP 144/78 | HR 68 | Ht 66.0 in | Wt 149.5 lb

## 2021-09-04 DIAGNOSIS — Z5181 Encounter for therapeutic drug level monitoring: Secondary | ICD-10-CM | POA: Diagnosis not present

## 2021-09-04 DIAGNOSIS — G40909 Epilepsy, unspecified, not intractable, without status epilepticus: Secondary | ICD-10-CM | POA: Diagnosis not present

## 2021-09-04 DIAGNOSIS — R269 Unspecified abnormalities of gait and mobility: Secondary | ICD-10-CM | POA: Diagnosis not present

## 2021-09-04 DIAGNOSIS — E559 Vitamin D deficiency, unspecified: Secondary | ICD-10-CM | POA: Diagnosis not present

## 2021-09-04 DIAGNOSIS — W19XXXD Unspecified fall, subsequent encounter: Secondary | ICD-10-CM

## 2021-09-04 NOTE — Patient Instructions (Addendum)
Continue with Depakote 1000 mg twice daily ?Continue Vimpat 200 mg twice daily ?Continue with Klonopin 0.5 mg twice daily ?We will check antiseizure medication with vitamin D level her latest Depakote level was 90 ?We will check routine EEG  ?Refer to physical therapy for gait training ?Continue to follow with your primary care doctor ?Return in 1 year or sooner if worse. ? ? ?

## 2021-09-04 NOTE — Progress Notes (Signed)
? ? ?PATIENT: Maria [name] ?DOB: 31-Aug-1960 ? ?REASON FOR VISIT: follow up ?HISTORY FROM: patient ? ?HISTORY OF PRESENT ILLNESS: ?Today 09/04/21 ?Patient present today for follow-up, she is accompanied by her mother.  Since last visit he denies any additional seizures.  She still on Depakote, Vimpat and clonazepam.  She reported she has been having increased falls, last fall was 2 weeks ago.  Patient denies any prodrome prior to fall, denies any weakness, no dizziness, no lightheadedness seizures falls.  Patient thinks she is not having seizures with these falls.  Per mother her seizures are described as generalized tonic-clonic seizures.   ? ? ?Interval history 11/09/2020:  ?Maria [name] is a 61 year old female with history of seizures.  Lives with her mother. In January, hospitalized for 45 days with a renal abscess and sepsis, was delirious, some question if she had a seizure, but her Depakote levels dropped to nothing, probably not getting the Depakote.  Went back on Depakote, Vimpat, clonazepam. Then went back to have appendix out.  ? ? In the ER 10/10/20, reporting a few falls over the several days, because her legs gave out. MRI of the brain and head CT was negative for acute findings.  CT lumbar spine was negative, did show spondylosis of the lumbar spine, x-ray of elbow and knee were negative.  She refused a left hip x-ray. CBC, CMP were unremarkable, glucose was 102, BUN 21, creatinine 0.83. ? ?No recent seizures. No more falls. Feeling good since in ER, no issues. ER issue was felt dehydration issue. Tremor in hands when nervous. Is back to normal baseline. Denies dizziness. No new issues, claims doing well. Here today with her mom. ? ?Update 11/25/2019 SS: Maria [name] is a 61 year old female with history of seizures.  No recent seizures, last was about 4 years ago.  She remains on clonazepam, Depakote, and Vimpat.  She lives with her mother.  She does not drive a car.  Her mother manages her medications.   She had 1 fall recently, tripped on her flip-flop, sprained her left ankle, is wearing a boot. May have occasional tremors of her hands, noted when playing cards.  Denies any new problems or concerns. Had blood work done with PCP last week. Presents today for evaluation accompanied by her mom. ? ?HISTORY ?10/20/2018 MM: Maria [name] is a 61 year old female with a history of seizures.  She was unable to do and did not feel comfortable coming to the office due to COVID-19.  We discussed her seizures over the phone.  I also spoke with her mother today with information as well.  The patient has not had any recent seizures.  She is doing well on clonazepam, Depakote and Vimpat.  She lives with her mother.  She is able to complete ADLs independently.  She does not operate a motor vehicle.  Denies any changes with her gait or balance.  Denies any new issues. ? ?REVIEW OF SYSTEMS: Out of a complete 14 system review of symptoms, the patient complains only of the following symptoms, and all other reviewed systems are negative. ? ?See HPI ? ?ALLERGIES: ?Allergies  ?Allergen Reactions  ? Codeine Nausea And Vomiting  ? Lamictal [Lamotrigine] Rash  ? ? ?HOME MEDICATIONS: ?Outpatient Medications Prior to Visit  ?Medication Sig Dispense Refill  ? atorvastatin (LIPITOR) 20 MG tablet Take 20 mg by mouth daily.    ? Calcium Carb-Cholecalciferol (CALCIUM/VITAMIN D PO) Take 1 tablet by mouth 2 (two) times daily.    ? cholecalciferol (  VITAMIN D) 25 MCG (1000 UNIT) tablet Take 1,000 Units by mouth daily.    ? clonazePAM (KLONOPIN) 0.5 MG tablet Take 1 tablet (0.5 mg total) by mouth 2 (two) times daily. 180 tablet 1  ? divalproex (DEPAKOTE) 500 MG DR tablet TAKE 2 TABLETS (1,000 MG TOTAL) BY MOUTH DAILY. 180 tablet 3  ? ibuprofen (ADVIL) 200 MG tablet Take 200 mg by mouth every 6 (six) hours as needed.    ? lacosamide (VIMPAT) 200 MG TABS tablet Take 1 tablet (200 mg total) by mouth 2 (two) times daily. 180 tablet 1  ? omeprazole (PRILOSEC)  20 MG capsule Take 1 capsule (20 mg total) by mouth daily. 90 capsule 3  ? oxybutynin (DITROPAN) 5 MG tablet 1 po qhs 90 tablet 3  ? Pediatric Multiple Vit-C-FA (PEDIATRIC MULTIVITAMIN) chewable tablet Chew 1 tablet by mouth daily.    ? VIMPAT 200 MG TABS tablet Take 1 tablet (200 mg total) by mouth 2 (two) times daily. 180 tablet 1  ? acetaminophen (TYLENOL) 500 MG tablet Take 1 tablet (500 mg total) by mouth every 6 (six) hours as needed for moderate pain. 30 tablet 0  ? levocetirizine (XYZAL) 5 MG tablet Take 5 mg by mouth every evening.    ? ?No facility-administered medications prior to visit.  ? ? ?PAST MEDICAL HISTORY: ?Past Medical History:  ?Diagnosis Date  ? Constipation   ? Dysphagia   ? Fracture   ? R foot  ? GERD (gastroesophageal reflux disease)   ? History of shingles 10/2016  ? Mental retardation   ? lesion in head  ? Seizures ([place])   ? "not fully controlled on max doses of meds" (Neuro ofc note 12/2014)  ? Thrombocytopenia ([place])   ? Tremor   ? ? ?PAST SURGICAL HISTORY: ?Past Surgical History:  ?Procedure Laterality Date  ? BIOPSY  09/16/2014  ? Procedure: BIOPSY;  Surgeon: Rogene Houston, MD;  Location: [facility];  Service: Endoscopy;;  ? CATARACT EXTRACTION    ? both eyes, May of 2015  ? COLONOSCOPY    ? COLONOSCOPY WITH PROPOFOL N/A 11/20/2018  ? Procedure: COLONOSCOPY WITH PROPOFOL;  Surgeon: Rogene Houston, MD;  Location: [facility];  Service: Endoscopy;  Laterality: N/A;  ? ESOPHAGEAL DILATION N/A 09/16/2014  ? Procedure: ESOPHAGEAL DILATION WITH 54FR MALONEY DILATOR;  Surgeon: Rogene Houston, MD;  Location: [facility];  Service: Endoscopy;  Laterality: N/A;  ? ESOPHAGOGASTRODUODENOSCOPY (EGD) WITH PROPOFOL N/A 09/16/2014  ? Procedure: ESOPHAGOGASTRODUODENOSCOPY (EGD) WITH PROPOFOL;  Surgeon: Rogene Houston, MD;  Location: [facility];  Service: Endoscopy;  Laterality: N/A;  ? LAPAROSCOPIC APPENDECTOMY N/A 07/14/2020  ? Procedure: APPENDECTOMY LAPAROSCOPIC;  Surgeon: Virl Cagey, MD;  Location: [facility];  Service: General;  Laterality: N/A;  ? MOUTH SURGERY    ? POLYPECTOMY  11/20/2018  ? Procedure: POLYPECTOMY;  Surgeon: Rogene Houston, MD;  Location: [facility];  Service: Endoscopy;;  colon  ? Skin graft to gum Right 08/2013  ? TONSILLECTOMY AND ADENOIDECTOMY    ? TOTAL ABDOMINAL HYSTERECTOMY    ? ? ?FAMILY HISTORY: ?Family History  ?Problem Relation Age of Onset  ? High Cholesterol Mother   ? High blood pressure Mother   ? Diabetes Father   ? ? ?SOCIAL HISTORY: ?Social History  ? ?Socioeconomic History  ? Marital status: Single  ?  Spouse name: Not on file  ? Number of children: 0  ? Years of education: 10  ? Highest education level:  Not on file  ?Occupational History  ?  Employer: UNEMPLOYED  ?Tobacco Use  ? Smoking status: Former  ?  Packs/day: 1.50  ?  Years: 15.00  ?  Pack years: 22.50  ?  Types: Cigarettes  ?  Quit date: 05/22/2006  ?  Years since quitting: 15.2  ? Smokeless tobacco: Never  ?Vaping Use  ? Vaping Use: Never used  ?Substance and Sexual Activity  ? Alcohol use: No  ?  Alcohol/week: 0.0 standard drinks  ? Drug use: No  ? Sexual activity: Never  ?  Birth control/protection: Other-see comments  ?  Comment: Hysterectomy  ?Other Topics Concern  ? Not on file  ?Social History Narrative  ? Patient is single and lives with her mother Romie Minus)  ?   ? Patient drinks 3 cups of caffeine daily.  ? Patient is right handed.  ?   ?   ? ?Social Determinants of Health  ? ?Financial Resource Strain: Not on file  ?Food Insecurity: Not on file  ?Transportation Needs: Not on file  ?Physical Activity: Not on file  ?Stress: Not on file  ?Social Connections: Not on file  ?Intimate Partner Violence: Not on file  ? ?PHYSICAL EXAM ? ?Vitals:  ? 09/04/21 1332  ?BP: (!) 144/78  ?Pulse: 68  ?Weight: 149 lb 8 oz (67.8 kg)  ?Height: '5\' 6"'$  (1.676 m)  ? ? ?Body mass index is 24.13 kg/m?. ? ?Generalized: Well developed, in no acute distress, petechia noted on skin exam.  ?Neurological examination  ?Mentation: Alert oriented  to time, place, relies on her mother for history. Follows all commands speech and language fluent ?Cranial nerve II-XII: Pupils were equal round reactive to light. Extraocular movements were full,

## 2021-09-12 LAB — CBC
Hematocrit: 38.4 % (ref 34.0–46.6)
Hemoglobin: 12.9 g/dL (ref 11.1–15.9)
MCH: 30.6 pg (ref 26.6–33.0)
MCHC: 33.6 g/dL (ref 31.5–35.7)
MCV: 91 fL (ref 79–97)
Platelets: 139 10*3/uL — ABNORMAL LOW (ref 150–450)
RBC: 4.22 x10E6/uL (ref 3.77–5.28)
RDW: 13.8 % (ref 11.7–15.4)
WBC: 6.5 10*3/uL (ref 3.4–10.8)

## 2021-09-12 LAB — VITAMIN D 25 HYDROXY (VIT D DEFICIENCY, FRACTURES): Vit D, 25-Hydroxy: 55.5 ng/mL (ref 30.0–100.0)

## 2021-09-12 LAB — LACOSAMIDE: Lacosamide: 13.6 ug/mL — ABNORMAL HIGH (ref 5.0–10.0)

## 2021-09-13 DIAGNOSIS — Z20822 Contact with and (suspected) exposure to covid-19: Secondary | ICD-10-CM | POA: Diagnosis not present

## 2021-09-19 ENCOUNTER — Ambulatory Visit (INDEPENDENT_AMBULATORY_CARE_PROVIDER_SITE_OTHER): Payer: Medicare Other | Admitting: Neurology

## 2021-09-19 DIAGNOSIS — G40909 Epilepsy, unspecified, not intractable, without status epilepticus: Secondary | ICD-10-CM

## 2021-09-19 NOTE — Procedures (Signed)
? ? ?  History: ? ?61 year old woman with seizure disorder  ? ?EEG classification: Awake and drowsy ? ?Description of the recording: The background rhythms of this recording consists of a fairly well modulated medium amplitude alpha rhythm of 9 Hz at best that is reactive to eye opening and closure. As the record progresses, the patient appears to remain in the waking state throughout the recording. Photic stimulation was performed, did not show any abnormalities. Hyperventilation was also performed, did not show any abnormalities. Toward the end of the recording, the patient enters the drowsy state with slight symmetric slowing seen. The patient never enters stage II sleep. There were occasional generalized spike and slow wave discharges. There was intermittent diffuse slowing. EKG monitor shows no evidence of cardiac rhythm abnormalities with a heart rate of 60. ? ?Abnormality:  ?Frequent spike and slow wave discharges  ?Intermittent diffuse slowing  ? ?Impression: This is an abnormal EEG recording in the waking and drowsy state due to presence of frequent spike and slow wave discharges which is consistent with a generalized epileptogenic potential as seen in patient with generalized epilepsy and diffuse slowing which is consistent with a generalized brain dysfunction such as in encephalopathy, nonspecific.  ? ? ?Alric Ran, MD ?[facility] ? ? ?

## 2021-09-25 ENCOUNTER — Other Ambulatory Visit: Payer: Self-pay | Admitting: *Deleted

## 2021-09-25 MED ORDER — CLONAZEPAM 0.5 MG PO TABS: 0.5000 mg | ORAL_TABLET | Freq: Two times a day (BID) | ORAL | 1 refills | Status: DC

## 2021-09-25 NOTE — Telephone Encounter (Signed)
Refill request for clonazepam received from [facility] (early to allow time for shipping). Pt last seen 09/04/21. Pending follow up 11/14/21. [place] narcotic registry checked. Last refill for #180 on 07/12/21. ?

## 2021-10-01 ENCOUNTER — Ambulatory Visit (INDEPENDENT_AMBULATORY_CARE_PROVIDER_SITE_OTHER): Payer: TRICARE For Life (TFL) | Admitting: Gastroenterology

## 2021-10-01 ENCOUNTER — Encounter (INDEPENDENT_AMBULATORY_CARE_PROVIDER_SITE_OTHER): Payer: Self-pay | Admitting: Gastroenterology

## 2021-10-03 ENCOUNTER — Telehealth: Payer: Self-pay | Admitting: Neurology

## 2021-10-03 NOTE — Telephone Encounter (Signed)
Refill has been sent (09/25/2021) with instructions to not fill until 5/10 or after. Pt not quite due for refill.   ?

## 2021-10-03 NOTE — Telephone Encounter (Signed)
Pt requesting refill for clonazePAM (KLONOPIN) 0.5 MG tablet at [facility].  ? ?

## 2021-10-11 ENCOUNTER — Telehealth: Payer: Self-pay | Admitting: Neurology

## 2021-10-11 NOTE — Telephone Encounter (Signed)
Pt's mother, Maple Mirza request refill for divalproex (DEPAKOTE) 500 MG DR tablet at [facility] ?

## 2021-10-11 NOTE — Telephone Encounter (Signed)
I left a VM for pt's mother, Maple Mirza (as per DPR). VM stated patient has refills of medication on file, [facility] should be sending out supply closer to fill date. ? ?A 90 day supply of divalproex (DEPAKOTE) 500 MG DR tablet was sent to [facility] on 08/22/2021. This prescription was sent with 3 refills. Patient should have medication. ? ?Supplied call back number for further questions. ?

## 2021-10-15 ENCOUNTER — Ambulatory Visit (HOSPITAL_COMMUNITY): Payer: Medicare Other

## 2021-10-23 ENCOUNTER — Encounter (INDEPENDENT_AMBULATORY_CARE_PROVIDER_SITE_OTHER): Payer: Self-pay | Admitting: *Deleted

## 2021-11-06 ENCOUNTER — Encounter (HOSPITAL_COMMUNITY): Payer: Self-pay

## 2021-11-06 ENCOUNTER — Ambulatory Visit (HOSPITAL_COMMUNITY): Payer: Medicare Other | Attending: Neurology

## 2021-11-06 DIAGNOSIS — W19XXXD Unspecified fall, subsequent encounter: Secondary | ICD-10-CM | POA: Insufficient documentation

## 2021-11-06 DIAGNOSIS — R2689 Other abnormalities of gait and mobility: Secondary | ICD-10-CM | POA: Diagnosis not present

## 2021-11-06 DIAGNOSIS — M6281 Muscle weakness (generalized): Secondary | ICD-10-CM

## 2021-11-06 NOTE — Therapy (Signed)
OUTPATIENT PHYSICAL THERAPY NEURO EVALUATION   Patient Name: Maria [name] MRN: [id number] DOB:07-29-1960, 61 y.o., female Today's Date: 11/06/2021   PCP: Allyn Kenner REFERRING PROVIDER: Alric Ran   PT End of Session - 11/06/21 1350     Visit Number 1    Number of Visits 10    Date for PT Re-Evaluation 12/11/21    Authorization Type [employer] (follows MCR guidelines)    Progress Note Due on Visit 10    PT Start Time 1350    PT Stop Time 1430    PT Time Calculation (min) 40 min    Behavior During Therapy Flat affect             Past Medical History:  Diagnosis Date   Constipation    Dysphagia    Fracture    R foot   GERD (gastroesophageal reflux disease)    History of shingles 10/2016   Mental retardation    lesion in head   Seizures ([place])    "not fully controlled on max doses of meds" (Neuro ofc note 12/2014)   Thrombocytopenia ([place])    Tremor    Past Surgical History:  Procedure Laterality Date   BIOPSY  09/16/2014   Procedure: BIOPSY;  Surgeon: Rogene Houston, MD;  Location: [facility];  Service: Endoscopy;;   CATARACT EXTRACTION     both eyes, May of 2015   COLONOSCOPY     COLONOSCOPY WITH PROPOFOL N/A 11/20/2018   Procedure: COLONOSCOPY WITH PROPOFOL;  Surgeon: Rogene Houston, MD;  Location: [facility];  Service: Endoscopy;  Laterality: N/A;   ESOPHAGEAL DILATION N/A 09/16/2014   Procedure: ESOPHAGEAL DILATION WITH 54FR MALONEY DILATOR;  Surgeon: Rogene Houston, MD;  Location: [facility];  Service: Endoscopy;  Laterality: N/A;   ESOPHAGOGASTRODUODENOSCOPY (EGD) WITH PROPOFOL N/A 09/16/2014   Procedure: ESOPHAGOGASTRODUODENOSCOPY (EGD) WITH PROPOFOL;  Surgeon: Rogene Houston, MD;  Location: [facility];  Service: Endoscopy;  Laterality: N/A;   LAPAROSCOPIC APPENDECTOMY N/A 07/14/2020   Procedure: APPENDECTOMY LAPAROSCOPIC;  Surgeon: Virl Cagey, MD;  Location: [facility];  Service: General;  Laterality: N/A;   MOUTH SURGERY     POLYPECTOMY  11/20/2018    Procedure: POLYPECTOMY;  Surgeon: Rogene Houston, MD;  [facility]: [facility];  Service: Endoscopy;;  colon   Skin graft to gum Right 08/2013   TONSILLECTOMY AND ADENOIDECTOMY     TOTAL ABDOMINAL HYSTERECTOMY     Patient Active Problem List   Diagnosis Date Noted   Pain in right shoulder 08/24/2021   Clavicle pain 08/15/2021   Acute urinary tract infection 03/13/2021   Dysuria 03/13/2021   COVID-19 02/19/2021   Allergic rhinitis 10/24/2020   Anemia 10/24/2020   Anxiety 10/24/2020   Heart failure ([place]) 10/24/2020   History of sepsis 10/24/2020   Mixed hyperlipidemia 10/24/2020   Neck pain 10/24/2020   Osteopenia 10/24/2020   Overactive bladder 10/24/2020   Polyp of colon 10/24/2020   ESBL (extended spectrum beta-lactamase) producing bacteria infection 05/12/2020   Bacteremia due to Escherichia coli 05/12/2020   Severe sepsis ([place]) 05/12/2020   Debility 05/05/2020   Pressure injury of skin 04/24/2020   Palliative care by specialist    Pyelonephritis of left kidney 04/14/2020   Renal abscess, left 04/14/2020   Leukocytosis 04/14/2020   Fever 04/14/2020   LLQ abdominal pain 04/14/2020   AKI (acute kidney injury) ([place])    Sepsis secondary to UTI ([place])    Pain in left ankle and joints  of left foot 12/02/2019   History of epilepsy 08/04/2019   Positive colorectal cancer screening using Cologuard test 10/28/2018   Posterior tibial tendinitis, right leg 04/23/2016   Pain in right foot 04/12/2016   Acute encephalopathy    Myoclonus    Frequent falls 11/09/2015   Fall 11/09/2015   Chest pain 03/16/2014   Upper abdominal pain 03/16/2014   Seizure disorder ([place]) 03/16/2014   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   Generalized convulsive epilepsy ([place]) 10/06/2012   DNR (do not resuscitate) discussion 10/06/2012   Intellectual disability    Thrombocytopenia ([place]) 05/23/2011   Seizures ([place]) 03/21/2011   GERD (gastroesophageal reflux disease) 03/21/2011   CLOSED  FRACTURE OF ACROMIAL END OF CLAVICLE 07/13/2008    ONSET DATE: 07/2021  REFERRING DIAG: K27.XXXD (ICD-10-CM) - Fall, subsequent encounter R26.9 (ICD-10-CM) - Gait abnormality   THERAPY DIAG:  Other abnormalities of gait and mobility  Muscle weakness (generalized)  Rationale for Evaluation and Treatment Rehabilitation  SUBJECTIVE:                                                                                                                                                                                              SUBJECTIVE STATEMENT:  Dx with epilepsy since 61 years old, pt has a life long history of seizures. Pt spends a lot of time sleeping because of medication. Patient recently followed up with neurologist who recommended PT. Pt has had several falls, not due to seizure. Patient now presents for therapy at MDs referral.  Pt accompanied by: family member  PERTINENT HISTORY:  epilepsy since 61 years old  PAIN:  Are you having pain? Yes: NPRS scale: unable to report/10 Pain location: low back pain  Pain description: fairly frequent back pain  Aggravating factors: walking  Relieving factors: resting   PRECAUTIONS: None  WEIGHT BEARING RESTRICTIONS No  FALLS: Has patient fallen in last 6 months? Yes. Number of falls 3. February/march of 2023  LIVING ENVIRONMENT: Lives with: lives with their family Lives in: House/apartment Stairs: Yes: Internal: 20fs  steps; yes HR  Has following equipment at home: Single point cane, Walker - 2 wheeled, Shower bench, and Grab bars  PLOF:  needed help with cooking, medication, laundry. Pt  is independent in walking in home, is without caretaker at times. Able to do a lot for herself.   PATIENT GOALS  improve walking, improve balance    OBJECTIVE:   DIAGNOSTIC FINDINGS:  Impression: This is an abnormal EEG recording in the waking and drowsy state due to presence of frequent spike and slow wave discharges which is consistent with a  generalized epileptogenic potential as seen in patient with generalized epilepsy and diffuse slowing which is consistent with a generalized brain dysfunction such as in encephalopathy In the ER 10/10/20, reporting a few falls over the several days, because her legs gave out. MRI of the brain and head CT was negative for acute findings.  CT lumbar spine was negative, did show spondylosis of the lumbar spine, x-ray of elbow and knee were negative.  She refused a left hip x-ray. CBC, CMP were unremarkable, glucose was 102, BUN 21, creatinine 0.83.    COGNITION: Overall cognitive status: Impaired: increased time for processing    SENSATION: WFL  COORDINATION: Impaired LE.    POSTURE: rounded shoulders and forward head    LOWER EXTREMITY MMT:    MMT Right Eval Left Eval  Hip flexion 5 5  Hip extension 4 single leg bridge  4  Hip abduction    Hip adduction 2 2  Hip internal rotation    Hip external rotation    Knee flexion 3- 3-  Knee extension 5 5  Ankle dorsiflexion 5 5  Ankle plantarflexion    Ankle inversion 5 5  Ankle eversion 5 5  (Blank rows = not tested) Patient with difficulty following commands to hold or move in certain manner during MMT  BED MOBILITY:  independent  BERG  1. SITTING TO STANDING  4 able to stand without using hands and stabilize independently  2. STANDING UNSUPPORTED  4 able to stand safely for 2 minutes  If a subject is able to stand 2 minutes unsupported, score full points for sitting unsupported. Proceed to item #4  3. SITTING WITH BACK UNSUPPORTED BUT FEET SUPPORTED ON FLOOR OR ON A STOOL 4 able to sit safely and securely for 2 minutes  4. STANDING TO SITTING 4 sits safely with minimal use of hands  5. TRANSFERS 4 able to transfer safely with minor use of hands  6. STANDING UNSUPPORTED WITH EYES CLOSED 4 able to stand 10 seconds safely   7. STANDING UNSUPPORTED WITH FEET TOGETHER 1 needs help to attain position but able to stand  15 seconds feet together  8. REACHING FORWARD WITH OUTSTRETCHED ARM WHILE STANDING 2 can reach forward 5 cm (2 inches  9. PICK UP OBJECT FROM THE FLOOR FROM A STANDING POSITION 0 unable to try/needs assist to keep from losing balance or falling Pt will not attempt is fearful that she will fall if trying.   10. TURNING TO LOOK BEHIND OVER LEFT AND RIGHT SHOULDERS WHILE STANDING  2 turns sideways only but maintains balance  11. TURN 360 DEGREES 2 able to turn 360 degrees safely but slowly  12. PLACE ALTERNATE FOOT ON STEP OR STOOL WHILE STANDING UNSUPPORTED 3 able to stand independently and complete 8 steps in > 20 seconds   13. STANDING UNSUPPORTED ONE FOOT IN FRONT 2 able to take small step independently and hold 30 seconds  14. STANDING ON ONE LEG 3 able to lift leg independently and hold 5-10 seconds   TOTAL SCORE  39/56 Score of < 45 indicates individuals may be at greater risk of falling Merrilee Jansky, 1992)           TRANSFERS:  Sit to stand: Complete Independence Stand to sit: Complete Independence Chair to chair: Complete Independence    GAIT: Gait pattern:  decreased cadence, step through pattern, decreased arm swing- Right, decreased arm swing- Left, decreased step length- Right, decreased step length- Left, and decreased stride length Distance walked: 282'  Assistive device utilized: None Level of assistance: Complete Independence   FUNCTIONAL TESTs:  5 times sit to stand: 14.09 seconds multi attempts to rise  2 minute walk test: 282'  Berg Balance Scale: 39/56    TODAY'S TREATMENT:  Evaluation    PATIENT EDUCATION:  Education details: Patient ans family educated on exam findings, POC, scope of PT Person educated: Patient and family  Education method: Explanation, Demonstration, and Handouts Education comprehension: verbalized understanding, returned demonstration, verbal cues required, and tactile cues required   HOME EXERCISE PROGRAM: Next  session    GOALS: Goals reviewed with patient? Yes  SHORT TERM GOALS: Target date: 11/20/2021  Patient will be independent with HEP in order to improve functional outcomes. Baseline:  Goal status: INITIAL  2.  Patient will improve five time sit to stand by 5 seconds to improve efficiency and safety with tranfers.  Baseline: 14.09 Goal status: INITIAL   LONG TERM GOALS: Target date: 12/11/2021  1.  Patient will improve 2MWT  by 100'   to improve safety, efficiency and ability to navigate home and community.  Baseline: 282' Goal status: INITIAL  2.  Patient will improve adductor  strength by 1/2 grade to work towards improved performance of transfers, stairs and ambulation.  Baseline: 2 Goal status: INITIAL  3. Patient will score at least 5 point on BERG  in order to demonstrate improved balance to reduce the risk for falls at home and in the community.  Baseline: 39/56 Goal status: INITIAL  ASSESSMENT:  CLINICAL IMPRESSION: Patient a 61 y.o. y.o. female who was seen today for physical therapy evaluation and treatment for gait and balance impairment.  Pt with history of epilepsy since childhood. Pt requires increased time for processing and direction. Patients answers to questions do not always correlate to the question asked. Pt presents today after having an increase in falls in the home. Pt presents with weakness in hamstrings and adductors though pt understanding of direction is in question. Pt with increased difficulty with narrow base balance and coordination into these positions. Patient is somewhat self limiting in these tasks secondary to decreased confidence and fear of falling. Patient ambulates with shorter stride, decreased arm swing, decreased cadence. Pt with reports of low back pain that mother reports is fairly frequent. Patient will benefit from skilled PT services to improve strength, balance and functional mobility. Current plan is for 5 weeks and then re-evaluate  what benefits patient is getting from PT.    OBJECTIVE IMPAIRMENTS Abnormal gait, decreased balance, decreased cognition, decreased coordination, decreased mobility, decreased strength, and pain.   ACTIVITY LIMITATIONS standing, squatting, and balance  PARTICIPATION LIMITATIONS: cleaning, shopping, community activity, and yard work  PERSONAL FACTORS 3+ comorbidities: anxiety, leukocytosis, osteopenia, epilepsy  are also affecting patient's functional outcome.   REHAB POTENTIAL: Fair direction following   CLINICAL DECISION MAKING: Evolving/moderate complexity  EVALUATION COMPLEXITY: Moderate  PLAN: PT FREQUENCY: 2x/week  PT DURATION: other: 5 weeks and then reassess benefits from PT.   PLANNED INTERVENTIONS: Therapeutic exercises, Therapeutic activity, Neuromuscular re-education, Balance training, Gait training, Patient/Family education, Joint manipulation, Joint mobilization, Stair training, Orthotic/Fit training, DME instructions, Aquatic Therapy, Dry Needling, Electrical stimulation, Spinal manipulation, Spinal mobilization, Cryotherapy, Moist heat, Compression bandaging, scar mobilization, Splintting, Taping, Traction, Ultrasound, Ionotophoresis '4mg'$ /ml Dexamethasone, and Manual therapy   PLAN FOR NEXT SESSION: initiate HEP of balance including, tandem hold, tandem walks, braiding at counter, single leg stance at counter, heel raises, standing marching, supine ball squeeze with bridge   Alontae Chaloux,  PT 11/06/2021, 3:00 PM

## 2021-11-12 ENCOUNTER — Ambulatory Visit: Payer: Medicare Other | Admitting: Neurology

## 2021-11-12 ENCOUNTER — Ambulatory Visit (HOSPITAL_COMMUNITY): Payer: Medicare Other | Attending: Internal Medicine

## 2021-11-12 ENCOUNTER — Encounter (HOSPITAL_COMMUNITY): Payer: Self-pay

## 2021-11-12 DIAGNOSIS — M6281 Muscle weakness (generalized): Secondary | ICD-10-CM | POA: Diagnosis not present

## 2021-11-12 DIAGNOSIS — R2689 Other abnormalities of gait and mobility: Secondary | ICD-10-CM | POA: Diagnosis not present

## 2021-11-12 DIAGNOSIS — W19XXXD Unspecified fall, subsequent encounter: Secondary | ICD-10-CM | POA: Diagnosis not present

## 2021-11-12 NOTE — Therapy (Signed)
OUTPATIENT PHYSICAL THERAPY NEURO EVALUATION   Patient Name: Maria [name] MRN: [id number] DOB:[birth year], 61 y.o., female Today's Date: 11/12/2021   PCP: Allyn Kenner REFERRING PROVIDER: Alric Ran   PT End of Session - 11/12/21 1437     Visit Number 2    Number of Visits 10    Date for PT Re-Evaluation 12/11/21    Authorization Type [employer] for life (follows MCR guidelines)    Progress Note Due on Visit 10    PT Start Time 5809    PT Stop Time 1515    PT Time Calculation (min) 38 min    Behavior During Therapy Flat affect             Past Medical History:  Diagnosis Date   Constipation    Dysphagia    Fracture    R foot   GERD (gastroesophageal reflux disease)    History of shingles 10/2016   Mental retardation    lesion in head   Seizures ([place])    "not fully controlled on max doses of meds" (Neuro ofc note 12/2014)   Thrombocytopenia ([place])    Tremor    Past Surgical History:  Procedure Laterality Date   BIOPSY  09/16/2014   Procedure: BIOPSY;  Surgeon: Rogene Houston, MD;  Location: [facility];  Service: Endoscopy;;   CATARACT EXTRACTION     both eyes, May of 2015   COLONOSCOPY     COLONOSCOPY WITH PROPOFOL N/A 11/20/2018   Procedure: COLONOSCOPY WITH PROPOFOL;  Surgeon: Rogene Houston, MD;  [facility];  Service: Endoscopy;  Laterality: N/A;   ESOPHAGEAL DILATION N/A 09/16/2014   Procedure: ESOPHAGEAL DILATION WITH 54FR MALONEY DILATOR;  Surgeon: Rogene Houston, MD;  Location: [facility];  Service: Endoscopy;  Laterality: N/A;   ESOPHAGOGASTRODUODENOSCOPY (EGD) WITH PROPOFOL N/A 09/16/2014   Procedure: ESOPHAGOGASTRODUODENOSCOPY (EGD) WITH PROPOFOL;  Surgeon: Rogene Houston, MD;  Location: [facility];  Service: Endoscopy;  Laterality: N/A;   LAPAROSCOPIC APPENDECTOMY N/A 07/14/2020   Procedure: APPENDECTOMY LAPAROSCOPIC;  Surgeon: Virl Cagey, MD;  Location: [facility];  Service: General;  Laterality: N/A;   MOUTH SURGERY     POLYPECTOMY   11/20/2018   Procedure: POLYPECTOMY;  Surgeon: Rogene Houston, MD;  [facility];  Service: Endoscopy;;  colon   Skin graft to gum Right 08/2013   TONSILLECTOMY AND ADENOIDECTOMY     TOTAL ABDOMINAL HYSTERECTOMY     Patient Active Problem List   Diagnosis Date Noted   Pain in right shoulder 08/24/2021   Clavicle pain 08/15/2021   Acute urinary tract infection 03/13/2021   Dysuria 03/13/2021   COVID-19 02/19/2021   Allergic rhinitis 10/24/2020   Anemia 10/24/2020   Anxiety 10/24/2020   Heart failure ([place]) 10/24/2020   History of sepsis 10/24/2020   Mixed hyperlipidemia 10/24/2020   Neck pain 10/24/2020   Osteopenia 10/24/2020   Overactive bladder 10/24/2020   Polyp of colon 10/24/2020   ESBL (extended spectrum beta-lactamase) producing bacteria infection 05/12/2020   Bacteremia due to Escherichia coli 05/12/2020   Severe sepsis ([place]) 05/12/2020   Debility 05/05/2020   Pressure injury of skin 04/24/2020   Palliative care by specialist    Pyelonephritis of left kidney 04/14/2020   Renal abscess, left 04/14/2020   Leukocytosis 04/14/2020   Fever 04/14/2020   LLQ abdominal pain 04/14/2020   AKI (acute kidney injury) ([place])    Sepsis secondary to UTI ([place])    Pain in left ankle and joints  of left foot 12/02/2019   History of epilepsy 08/04/2019   Positive colorectal cancer screening using Cologuard test 10/28/2018   Posterior tibial tendinitis, right leg 04/23/2016   Pain in right foot 04/12/2016   Acute encephalopathy    Myoclonus    Frequent falls 11/09/2015   Fall 11/09/2015   Chest pain 03/16/2014   Upper abdominal pain 03/16/2014   Seizure disorder ([place]) 03/16/2014   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   Generalized convulsive epilepsy ([place]) 10/06/2012   DNR (do not resuscitate) discussion 10/06/2012   Intellectual disability    Thrombocytopenia ([place]) 05/23/2011   Seizures ([place]) 03/21/2011   GERD (gastroesophageal reflux disease) 03/21/2011    CLOSED FRACTURE OF ACROMIAL END OF CLAVICLE 07/13/2008    ONSET DATE: 07/2021  REFERRING DIAG: R15.XXXD (ICD-10-CM) - Fall, subsequent encounter R26.9 (ICD-10-CM) - Gait abnormality   THERAPY DIAG:  Other abnormalities of gait and mobility  Muscle weakness (generalized)  Rationale for Evaluation and Treatment Rehabilitation  SUBJECTIVE:                                                                                                                                                                                              SUBJECTIVE STATEMENT: Patient arrives stating that she is not in any pain. Arrives with mother.no new reports.     FROM IE: Dx with epilepsy since 61 years old, pt has a life long history of seizures. Pt spends a lot of time sleeping because of medication. Patient recently followed up with neurologist who recommended PT. Pt has had several falls, not due to seizure. Patient now presents for therapy at MDs referral.  Pt accompanied by: family member  PERTINENT HISTORY:  epilepsy since 61 years old  PAIN:  Are you having pain? Yes: NPRS scale: unable to report/10 Pain location: low back pain  Pain description: fairly frequent back pain  Aggravating factors: walking  Relieving factors: resting   PRECAUTIONS: None  WEIGHT BEARING RESTRICTIONS No  FALLS: Has patient fallen in last 6 months? Yes. Number of falls 3. February/march of 2023  LIVING ENVIRONMENT: Lives with: lives with their family Lives in: House/apartment Stairs: Yes: Internal: 21fs  steps; yes HR  Has following equipment at home: Single point cane, Walker - 2 wheeled, Shower bench, and Grab bars  PLOF:  needed help with cooking, medication, laundry. Pt  is independent in walking in home, is without caretaker at times. Able to do a lot for herself.   PATIENT GOALS  improve walking, improve balance    OBJECTIVE:   DIAGNOSTIC FINDINGS:  Impression: This is an abnormal EEG recording in  the  waking and drowsy state due to presence of frequent spike and slow wave discharges which is consistent with a generalized epileptogenic potential as seen in patient with generalized epilepsy and diffuse slowing which is consistent with a generalized brain dysfunction such as in encephalopathy In the ER 10/10/20, reporting a few falls over the several days, because her legs gave out. MRI of the brain and head CT was negative for acute findings.  CT lumbar spine was negative, did show spondylosis of the lumbar spine, x-ray of elbow and knee were negative.  She refused a left hip x-ray. CBC, CMP were unremarkable, glucose was 102, BUN 21, creatinine 0.83.    COGNITION: Overall cognitive status: Impaired: increased time for processing    SENSATION: WFL  COORDINATION: Impaired LE.    POSTURE: rounded shoulders and forward head    LOWER EXTREMITY MMT:    MMT Right Eval Left Eval  Hip flexion 5 5  Hip extension 4 single leg bridge  4  Hip abduction    Hip adduction 2 2  Hip internal rotation    Hip external rotation    Knee flexion 3- 3-  Knee extension 5 5  Ankle dorsiflexion 5 5  Ankle plantarflexion    Ankle inversion 5 5  Ankle eversion 5 5  (Blank rows = not tested) Patient with difficulty following commands to hold or move in certain manner during MMT  BED MOBILITY:  independent  BERG  1. SITTING TO STANDING  4 able to stand without using hands and stabilize independently  2. STANDING UNSUPPORTED  4 able to stand safely for 2 minutes  If a subject is able to stand 2 minutes unsupported, score full points for sitting unsupported. Proceed to item #4  3. SITTING WITH BACK UNSUPPORTED BUT FEET SUPPORTED ON FLOOR OR ON A STOOL 4 able to sit safely and securely for 2 minutes  4. STANDING TO SITTING 4 sits safely with minimal use of hands  5. TRANSFERS 4 able to transfer safely with minor use of hands  6. STANDING UNSUPPORTED WITH EYES CLOSED 4 able to stand 10  seconds safely   7. STANDING UNSUPPORTED WITH FEET TOGETHER 1 needs help to attain position but able to stand 15 seconds feet together  8. REACHING FORWARD WITH OUTSTRETCHED ARM WHILE STANDING 2 can reach forward 5 cm (2 inches  9. PICK UP OBJECT FROM THE FLOOR FROM A STANDING POSITION 0 unable to try/needs assist to keep from losing balance or falling Pt will not attempt is fearful that she will fall if trying.   10. TURNING TO LOOK BEHIND OVER LEFT AND RIGHT SHOULDERS WHILE STANDING  2 turns sideways only but maintains balance  11. TURN 360 DEGREES 2 able to turn 360 degrees safely but slowly  12. PLACE ALTERNATE FOOT ON STEP OR STOOL WHILE STANDING UNSUPPORTED 3 able to stand independently and complete 8 steps in > 20 seconds   13. STANDING UNSUPPORTED ONE FOOT IN FRONT 2 able to take small step independently and hold 30 seconds  14. STANDING ON ONE LEG 3 able to lift leg independently and hold 5-10 seconds   TOTAL SCORE  39/56 Score of < 45 indicates individuals may be at greater risk of falling Merrilee Jansky, 1992)           TRANSFERS:  Sit to stand: Complete Independence Stand to sit: Complete Independence Chair to chair: Complete Independence    GAIT: Gait pattern:  decreased cadence, step through pattern, decreased arm  swing- Right, decreased arm swing- Left, decreased step length- Right, decreased step length- Left, and decreased stride length Distance walked: 282'  Assistive device utilized: None Level of assistance: Complete Independence   FUNCTIONAL TESTs:  5 times sit to stand: 14.09 seconds multi attempts to rise  2 minute walk test: 282'  Berg Balance Scale: 39/56    TODAY'S TREATMENT:  11/12/21 - Tandem Stance with Support  - 1 x daily - 7 x weekly - 1 sets - 10 reps - 10 hold - Tandem Walking with Counter Support  - 1 x daily - 7 x weekly - 3 sets - 10 reps - Carioca with Unilateral Counter Support  - 1 x daily - 7 x weekly - 3 sets - 10 reps -  Single Leg Stance with Support  - 1 x daily - 7 x weekly - 1 sets - 10 reps - 10 hold - Heel Raises with Counter Support  - 1 x daily - 7 x weekly - 3 sets - 10 reps - Standing March with Counter Support  - 1 x daily - 7 x weekly - 3 sets - 10 reps - 2 hold - Sit to Stand Without Arm Support  - 1 x daily - 7 x weekly - 2 sets - 10 reps - Supine Bridge with Mini Swiss Ball Between Knees  - 1 x daily - 7 x weekly - 2 sets - 10 reps - 3 hold  Evaluation    PATIENT EDUCATION:  Education details: Patient ans family educated on exam findings, POC, scope of PT Person educated: Patient and family  Education method: Explanation, Demonstration, and Handouts Education comprehension: verbalized understanding, returned demonstration, verbal cues required, and tactile cues required   HOME EXERCISE PROGRAM: Access Code: [email or web address] URL: [email or web address] Date: 11/12/2021 Prepared by: Leota Jacobsen  Exercises - Tandem Stance with Support  - 1 x daily - 7 x weekly - 1 sets - 10 reps - 10 hold - Tandem Walking with Counter Support  - 1 x daily - 7 x weekly - 3 sets - 10 reps - Carioca with Unilateral Counter Support  - 1 x daily - 7 x weekly - 3 sets - 10 reps - Single Leg Stance with Support  - 1 x daily - 7 x weekly - 1 sets - 10 reps - 10 hold - Heel Raises with Counter Support  - 1 x daily - 7 x weekly - 3 sets - 10 reps - Standing March with Counter Support  - 1 x daily - 7 x weekly - 3 sets - 10 reps - 2 hold - Sit to Stand Without Arm Support  - 1 x daily - 7 x weekly - 2 sets - 10 reps - Supine Bridge with Mini Swiss Ball Between Knees  - 1 x daily - 7 x weekly - 2 sets - 10 reps - 3 hold    GOALS: Goals reviewed with patient? Yes  SHORT TERM GOALS: Target date: 11/26/2021  Patient will be independent with HEP in order to improve functional outcomes. Baseline:  Goal status: INITIAL  2.  Patient will improve five time sit to stand by 5 seconds to improve efficiency and  safety with tranfers.  Baseline: 14.09 Goal status: INITIAL   LONG TERM GOALS: Target date: 12/17/2021  1.  Patient will improve 2MWT  by 100'   to improve safety, efficiency and ability to navigate home and community.  Baseline: 282' Goal status: INITIAL  2.  Patient will  improve adductor  strength by 1/2 grade to work towards improved performance of transfers, stairs and ambulation.  Baseline: 2 Goal status: INITIAL  3. Patient will score at least 5 point on BERG  in order to demonstrate improved balance to reduce the risk for falls at home and in the community.  Baseline: 39/56 Goal status: INITIAL  ASSESSMENT:  CLINICAL IMPRESSION: Patient arrives in flip flops, asked patient to wear tennis sneakers at next session. Pts mother present for session. HEP performed and reviewed with patient. Patient needing multi cues throughout performing, pt hesitant to use less UE support.Patient will benefit from skilled PT services to improve strength, balance and functional mobility.     From IE Patient a 61 y.o. y.o. female who was seen today for physical therapy evaluation and treatment for gait and balance impairment.  Pt with history of epilepsy since childhood. Pt requires increased time for processing and direction. Patients answers to questions do not always correlate to the question asked. Pt presents today after having an increase in falls in the home. Pt presents with weakness in hamstrings and adductors though pt understanding of direction is in question. Pt with increased difficulty with narrow base balance and coordination into these positions. Patient is somewhat self limiting in these tasks secondary to decreased confidence and fear of falling. Patient ambulates with shorter stride, decreased arm swing, decreased cadence. Pt with reports of low back pain that mother reports is fairly frequent. Patient will benefit from skilled PT services to improve strength, balance and functional  mobility. Current plan is for 5 weeks and then re-evaluate what benefits patient is getting from PT.    OBJECTIVE IMPAIRMENTS Abnormal gait, decreased balance, decreased cognition, decreased coordination, decreased mobility, decreased strength, and pain.   ACTIVITY LIMITATIONS standing, squatting, and balance  PARTICIPATION LIMITATIONS: cleaning, shopping, community activity, and yard work  PERSONAL FACTORS 3+ comorbidities: anxiety, leukocytosis, osteopenia, epilepsy  are also affecting patient's functional outcome.   REHAB POTENTIAL: Fair direction following   CLINICAL DECISION MAKING: Evolving/moderate complexity  EVALUATION COMPLEXITY: Moderate  PLAN: PT FREQUENCY: 2x/week  PT DURATION: other: 5 weeks and then reassess benefits from PT.   PLANNED INTERVENTIONS: Therapeutic exercises, Therapeutic activity, Neuromuscular re-education, Balance training, Gait training, Patient/Family education, Joint manipulation, Joint mobilization, Stair training, Orthotic/Fit training, DME instructions, Aquatic Therapy, Dry Needling, Electrical stimulation, Spinal manipulation, Spinal mobilization, Cryotherapy, Moist heat, Compression bandaging, scar mobilization, Splintting, Taping, Traction, Ultrasound, Ionotophoresis '4mg'$ /ml Dexamethasone, and Manual therapy   PLAN FOR NEXT SESSION: continue with gait and balance exercises   Amelda Hapke, PT 11/12/2021, 3:10 PM

## 2021-11-13 NOTE — Progress Notes (Deleted)
Patient: Maria [name] Date of Birth: [birth year]  Reason for Visit: Follow up History from: Patient Primary Neurologist:    ASSESSMENT AND PLAN 61 y.o. year old female    HISTORY OF PRESENT ILLNESS: Today 11/13/21 Maria [name] is here today for follow-up.  EEG in April 2023 was abnormal showing frequent spike and slow wave discharges consistent with generalized epileptogenic potential.  Remains on Depakote, Vimpat, clonazepam.   HISTORY  09/04/21 Dr. April Manson: Patient present today for follow-up, she is accompanied by her mother.  Since last visit he denies any additional seizures.  She still on Depakote, Vimpat and clonazepam.  She reported she has been having increased falls, last fall was 2 weeks ago.  Patient denies any prodrome prior to fall, denies any weakness, no dizziness, no lightheadedness seizures falls.  Patient thinks she is not having seizures with these falls.  Per mother her seizures are described as generalized tonic-clonic seizures.   REVIEW OF SYSTEMS: Out of a complete 14 system review of symptoms, the patient complains only of the following symptoms, and all other reviewed systems are negative.  See HPI  ALLERGIES: Allergies  Allergen Reactions   Codeine Nausea And Vomiting   Lamictal [Lamotrigine] Rash    HOME MEDICATIONS: Outpatient Medications Prior to Visit  Medication Sig Dispense Refill   atorvastatin (LIPITOR) 20 MG tablet Take 20 mg by mouth daily.     Calcium Carb-Cholecalciferol (CALCIUM/VITAMIN D PO) Take 1 tablet by mouth 2 (two) times daily.     cholecalciferol (VITAMIN D) 25 MCG (1000 UNIT) tablet Take 1,000 Units by mouth daily.     clonazePAM (KLONOPIN) 0.5 MG tablet Take 1 tablet (0.5 mg total) by mouth 2 (two) times daily. 180 tablet 1   divalproex (DEPAKOTE) 500 MG DR tablet TAKE 2 TABLETS (1,000 MG TOTAL) BY MOUTH DAILY. 180 tablet 3   ibuprofen (ADVIL) 200 MG tablet Take 200 mg by mouth every 6 (six) hours as needed.      lacosamide (VIMPAT) 200 MG TABS tablet Take 1 tablet (200 mg total) by mouth 2 (two) times daily. 180 tablet 1   omeprazole (PRILOSEC) 20 MG capsule Take 1 capsule (20 mg total) by mouth daily. 90 capsule 3   oxybutynin (DITROPAN) 5 MG tablet 1 po qhs 90 tablet 3   Pediatric Multiple Vit-C-FA (PEDIATRIC MULTIVITAMIN) chewable tablet Chew 1 tablet by mouth daily.     VIMPAT 200 MG TABS tablet Take 1 tablet (200 mg total) by mouth 2 (two) times daily. 180 tablet 1   No facility-administered medications prior to visit.    PAST MEDICAL HISTORY: Past Medical History:  Diagnosis Date   Constipation    Dysphagia    Fracture    R foot   GERD (gastroesophageal reflux disease)    History of shingles 10/2016   Mental retardation    lesion in head   Seizures ([place])    "not fully controlled on max doses of meds" (Neuro ofc note 12/2014)   Thrombocytopenia ([place])    Tremor     PAST SURGICAL HISTORY: Past Surgical History:  Procedure Laterality Date   BIOPSY  09/16/2014   Procedure: BIOPSY;  Surgeon: Rogene Houston, MD;  Location[facility];  Service: Endoscopy;;   CATARACT EXTRACTION     both eyes, May of 2015   COLONOSCOPY     COLONOSCOPY WITH PROPOFOL N/A 11/20/2018   Procedure: COLONOSCOPY WITH PROPOFOL;  Surgeon: Rogene Houston, MD;  [facility];  Service: Endoscopy;  Laterality: N/A;   ESOPHAGEAL DILATION N/A 09/16/2014   Procedure: ESOPHAGEAL DILATION WITH 54FR MALONEY DILATOR;  Surgeon: Rogene Houston, MD;  Location: [facility];  Service: Endoscopy;  Laterality: N/A;   ESOPHAGOGASTRODUODENOSCOPY (EGD) WITH PROPOFOL N/A 09/16/2014   Procedure: ESOPHAGOGASTRODUODENOSCOPY (EGD) WITH PROPOFOL;  Surgeon: Rogene Houston, MD;  Location: [facility];  Service: Endoscopy;  Laterality: N/A;   LAPAROSCOPIC APPENDECTOMY N/A 07/14/2020   Procedure: APPENDECTOMY LAPAROSCOPIC;  Surgeon: Virl Cagey, MD;  Location: [facility];  Service: General;  Laterality: N/A;   MOUTH SURGERY     POLYPECTOMY   11/20/2018   Procedure: POLYPECTOMY;  Surgeon: Rogene Houston, MD;  [facility];  Service: Endoscopy;;  colon   Skin graft to gum Right 08/2013   TONSILLECTOMY AND ADENOIDECTOMY     TOTAL ABDOMINAL HYSTERECTOMY      FAMILY HISTORY: Family History  Problem Relation Age of Onset   High Cholesterol Mother    High blood pressure Mother    Diabetes Father     SOCIAL HISTORY: Social History   Socioeconomic History   Marital status: Single    Spouse name: Not on file   Number of children: 0   Years of education: 10   Highest education level: Not on file  Occupational History    Employer: UNEMPLOYED  Tobacco Use   Smoking status: Former    Packs/day: 1.50    Years: 15.00    Total pack years: 22.50    Types: Cigarettes    Quit date: 05/22/2006    Years since quitting: 15.4   Smokeless tobacco: Never  Vaping Use   Vaping Use: Never used  Substance and Sexual Activity   Alcohol use: No    Alcohol/week: 0.0 standard drinks of alcohol   Drug use: No   Sexual activity: Never    Birth control/protection: Other-see comments    Comment: Hysterectomy  Other Topics Concern   Not on file  Social History Narrative   Patient is single and lives with her mother Romie Minus)      Patient drinks 3 cups of caffeine daily.   Patient is right handed.         Social Determinants of Health   Financial Resource Strain: Not on file  Food Insecurity: Not on file  Transportation Needs: Not on file  Physical Activity: Not on file  Stress: Not on file  Social Connections: Not on file  Intimate Partner Violence: Not on file    PHYSICAL EXAM  There were no vitals filed for this visit. There is no height or weight on file to calculate BMI.  Generalized: Well developed, in no acute distress  Neurological examination  Mentation: Alert oriented to time, place, history taking. Follows all commands speech and language fluent Cranial nerve II-XII: Pupils were equal round reactive to  light. Extraocular movements were full, visual field were full on confrontational test. Facial sensation and strength were normal. Uvula tongue midline. Head turning and shoulder shrug  were normal and symmetric. Motor: The motor testing reveals 5 over 5 strength of all 4 extremities. Good symmetric motor tone is noted throughout.  Sensory: Sensory testing is intact to soft touch on all 4 extremities. No evidence of extinction is noted.  Coordination: Cerebellar testing reveals good finger-nose-finger and heel-to-shin bilaterally.  Gait and station: Gait is normal. Tandem gait is normal. Romberg is negative. No drift is seen.  Reflexes: Deep tendon reflexes are symmetric and normal bilaterally.   DIAGNOSTIC  DATA (LABS, IMAGING, TESTING) - I reviewed patient records, labs, notes, testing and imaging myself where available.  Lab Results  Component Value Date   WBC 6.5 09/04/2021   HGB 12.9 09/04/2021   HCT 38.4 09/04/2021   MCV 91 09/04/2021   PLT 139 (L) 09/04/2021      Component Value Date/Time   NA 142 08/28/2021 0505   NA 143 11/05/2016 1104   K 3.9 08/28/2021 0505   CL 109 08/28/2021 0505   CO2 25 08/28/2021 0505   GLUCOSE 107 (H) 08/28/2021 0505   BUN 23 (H) 08/28/2021 0505   BUN 25 (H) 11/05/2016 1104   CREATININE 0.95 08/28/2021 0505   CALCIUM 9.5 08/28/2021 0505   PROT 6.5 10/10/2020 1257   PROT 6.8 11/05/2016 1104   ALBUMIN 3.8 10/10/2020 1257   ALBUMIN 4.2 11/05/2016 1104   AST 18 10/10/2020 1257   ALT 19 10/10/2020 1257   ALKPHOS 67 10/10/2020 1257   BILITOT 0.6 10/10/2020 1257   BILITOT 0.3 11/05/2016 1104   GFRNONAA >60 08/28/2021 0505   GFRAA >60 06/11/2017 1542   Lab Results  Component Value Date   CHOL  10/13/2009    169        ATP III CLASSIFICATION:  <200     mg/dL   Desirable  200-239  mg/dL   Borderline High  >=240    mg/dL   High          HDL 47 10/13/2009   LDLCALC (H) 10/13/2009    108        Total Cholesterol/HDL:CHD Risk Coronary Heart  Disease Risk Table                     Men   Women  1/2 Average Risk   3.4   3.3  Average Risk       5.0   4.4  2 X Average Risk   9.6   7.1  3 X Average Risk  23.4   11.0        Use the calculated Patient Ratio above and the CHD Risk Table to determine the patient's CHD Risk.        ATP III CLASSIFICATION (LDL):  <100     mg/dL   Optimal  100-129  mg/dL   Near or Above                    Optimal  130-159  mg/dL   Borderline  160-189  mg/dL   High  >190     mg/dL   Very High   TRIG 69 10/13/2009   CHOLHDL 3.6 10/13/2009   Lab Results  Component Value Date   HGBA1C 5.7 (H) 04/17/2020   Lab Results  Component Value Date   VITAMINB12 1,246 (H) 05/08/2020   Lab Results  Component Value Date   TSH 2.660 03/16/2014    Butler Denmark, AGNP-C, DNP 11/13/2021, 3:32 PM [facility] [address] [place], [place] [address] [phone]

## 2021-11-14 ENCOUNTER — Emergency Department (HOSPITAL_COMMUNITY)
Admission: EM | Admit: 2021-11-14 | Discharge: 2021-11-14 | Disposition: A | Discharge status: Home / Self Care | Payer: Medicare Other | Attending: Emergency Medicine | Admitting: Emergency Medicine

## 2021-11-14 ENCOUNTER — Telehealth: Payer: Self-pay | Admitting: Neurology

## 2021-11-14 ENCOUNTER — Other Ambulatory Visit: Payer: Self-pay

## 2021-11-14 ENCOUNTER — Ambulatory Visit: Payer: Medicare Other | Admitting: Neurology

## 2021-11-14 ENCOUNTER — Encounter (HOSPITAL_COMMUNITY): Payer: Self-pay | Admitting: Emergency Medicine

## 2021-11-14 DIAGNOSIS — N938 Other specified abnormal uterine and vaginal bleeding: Secondary | ICD-10-CM | POA: Diagnosis not present

## 2021-11-14 DIAGNOSIS — N939 Abnormal uterine and vaginal bleeding, unspecified: Secondary | ICD-10-CM | POA: Diagnosis not present

## 2021-11-14 LAB — URINALYSIS, ROUTINE W REFLEX MICROSCOPIC
Bilirubin Urine: NEGATIVE
Glucose, UA: NEGATIVE mg/dL
Hgb urine dipstick: NEGATIVE
Ketones, ur: 5 mg/dL — AB
Leukocytes,Ua: NEGATIVE
Nitrite: NEGATIVE
Protein, ur: NEGATIVE mg/dL
Specific Gravity, Urine: 1.018 (ref 1.005–1.030)
pH: 7 (ref 5.0–8.0)

## 2021-11-14 LAB — COMPREHENSIVE METABOLIC PANEL
ALT: 22 U/L (ref 0–44)
AST: 16 U/L (ref 15–41)
Albumin: 4 g/dL (ref 3.5–5.0)
Alkaline Phosphatase: 62 U/L (ref 38–126)
Anion gap: 11 (ref 5–15)
BUN: 20 mg/dL (ref 6–20)
CO2: 25 mmol/L (ref 22–32)
Calcium: 9.1 mg/dL (ref 8.9–10.3)
Chloride: 104 mmol/L (ref 98–111)
Creatinine, Ser: 0.89 mg/dL (ref 0.44–1.00)
GFR, Estimated: >60 mL/min (ref >60)
Glucose, Bld: 158 mg/dL — ABNORMAL HIGH (ref 70–99)
Potassium: 3.8 mmol/L (ref 3.5–5.1)
Sodium: 140 mmol/L (ref 135–145)
Total Bilirubin: 0.4 mg/dL (ref 0.3–1.2)
Total Protein: 6.5 g/dL (ref 6.5–8.1)

## 2021-11-14 LAB — CBC WITH DIFFERENTIAL/PLATELET
Abs Immature Granulocytes: 0.04 10*3/uL (ref 0.00–0.07)
Basophils Absolute: 0 10*3/uL (ref 0.0–0.1)
Basophils Relative: 1 %
Eosinophils Absolute: 0.2 10*3/uL (ref 0.0–0.5)
Eosinophils Relative: 3 %
HCT: 36.6 % (ref 36.0–46.0)
Hemoglobin: 12.3 g/dL (ref 12.0–15.0)
Immature Granulocytes: 1 %
Lymphocytes Relative: 46 %
Lymphs Abs: 2.7 10*3/uL (ref 0.7–4.0)
MCH: 31.7 pg (ref 26.0–34.0)
MCHC: 33.6 g/dL (ref 30.0–36.0)
MCV: 94.3 fL (ref 80.0–100.0)
Monocytes Absolute: 0.5 10*3/uL (ref 0.1–1.0)
Monocytes Relative: 9 %
Neutro Abs: 2.3 10*3/uL (ref 1.7–7.7)
Neutrophils Relative %: 40 %
Platelets: 139 10*3/uL — ABNORMAL LOW (ref 150–400)
RBC: 3.88 MIL/uL (ref 3.87–5.11)
RDW: 13.6 % (ref 11.5–15.5)
WBC: 5.8 10*3/uL (ref 4.0–10.5)
nRBC: 0 % (ref 0.0–0.2)

## 2021-11-14 MED ORDER — ACETAMINOPHEN 500 MG PO TABS
1000.0000 mg | ORAL_TABLET | Freq: Once | ORAL | Status: AC
  Administered 2021-11-14: 1000 mg via ORAL
  Filled 2021-11-14: qty 2

## 2021-11-14 MED ORDER — ESTROGENS CONJUGATED 0.625 MG/GM VA CREA
1.0000 | TOPICAL_CREAM | Freq: Every day | VAGINAL | 12 refills | Status: DC
Start: 2021-11-14 — End: 2022-07-04

## 2021-11-14 MED ORDER — ESTROGENS CONJUGATED 0.625 MG/GM VA CREA: 1.0000 | TOPICAL_CREAM | Freq: Every day | VAGINAL | 12 refills | Status: DC

## 2021-11-14 NOTE — ED Provider Notes (Signed)
[facility] Provider Note   CSN: [id number] Arrival date & time: 11/14/21  0058     History  Chief Complaint  Patient presents with   Vaginal Bleeding    KAJAH SANTIZO is a 61 y.o. female.   Vaginal Bleeding Quality:  Bright red Severity:  Moderate Onset quality:  Gradual Duration:  3 hours Timing:  Constant Progression:  Improving Chronicity:  Recurrent Menstrual history:  Postmenopausal (also with h/o hysterectomy) Relieved by:  None tried Worsened by:  Nothing      Home Medications Prior to Admission medications   Medication Sig Start Date End Date Taking? Authorizing Provider  atorvastatin (LIPITOR) 20 MG tablet Take 20 mg by mouth daily. 08/24/18   [provider]  Calcium Carb-Cholecalciferol (CALCIUM/VITAMIN D PO) Take 1 tablet by mouth 2 (two) times daily.    [provider]  cholecalciferol (VITAMIN D) 25 MCG (1000 UNIT) tablet Take 1,000 Units by mouth daily.    [provider]  clonazePAM (KLONOPIN) 0.5 MG tablet Take 1 tablet (0.5 mg total) by mouth 2 (two) times daily. 09/25/21   Suzzanne Cloud, NP  conjugated estrogens (PREMARIN) vaginal cream Place 1 Applicatorful vaginally daily. 11/14/21   Mannix Kroeker, Corene Cornea, MD  divalproex (DEPAKOTE) 500 MG DR tablet TAKE 2 TABLETS (1,000 MG TOTAL) BY MOUTH DAILY. 08/23/21 08/23/22  Suzzanne Cloud, NP  ibuprofen (ADVIL) 200 MG tablet Take 200 mg by mouth every 6 (six) hours as needed.    [provider]  lacosamide (VIMPAT) 200 MG TABS tablet Take 1 tablet (200 mg total) by mouth 2 (two) times daily. 08/23/21   Suzzanne Cloud, NP  omeprazole (PRILOSEC) 20 MG capsule Take 1 capsule (20 mg total) by mouth daily. 12/21/20   Harvel Quale, MD  oxybutynin (DITROPAN) 5 MG tablet 1 po qhs 11/23/20   Franchot Gallo, MD  Pediatric Multiple Vit-C-FA (PEDIATRIC MULTIVITAMIN) chewable tablet Chew 1 tablet by mouth daily.    [provider]  VIMPAT 200 MG TABS  tablet Take 1 tablet (200 mg total) by mouth 2 (two) times daily. 02/22/21   Sater, Nanine Means, MD  QUEtiapine (SEROQUEL) 25 MG tablet Take 1 tablet (25 mg total) by mouth 2 (two) times daily. 05/31/20 06/06/20  Angiulli, Lavon Paganini, PA-C      Allergies    Codeine and Lamictal [lamotrigine]    Review of Systems   Review of Systems  Genitourinary:  Positive for vaginal bleeding.    Physical Exam Updated Vital Signs BP 123/68   Pulse 71   Temp 97.9 F (36.6 C) (Oral)   Resp 18   Ht '5\' 6"'$  (1.676 m)   Wt 68 kg   SpO2 96%   BMI 24.20 kg/m  Physical Exam Vitals and nursing note reviewed.  Constitutional:      Appearance: She is well-developed.  HENT:     Head: Normocephalic and atraumatic.     Mouth/Throat:     Mouth: Mucous membranes are moist.  Eyes:     Pupils: Pupils are equal, round, and reactive to light.  Cardiovascular:     Rate and Rhythm: Normal rate and regular rhythm.  Pulmonary:     Effort: No respiratory distress.     Breath sounds: No stridor.  Abdominal:     General: Abdomen is flat. There is no distension.  Genitourinary:    Comments: Chaperoned by nurse Stanton Kidney.   Small amount of diluted blood on pad. Mild pale mucosa.  Musculoskeletal:  General: No swelling or tenderness. Normal range of motion.     Cervical back: Normal range of motion.  Skin:    General: Skin is warm and dry.     Comments: Mild diffuse petechiae  Neurological:     General: No focal deficit present.     Mental Status: She is alert.     ED Results / Procedures / Treatments   Labs (all labs ordered are listed, but only abnormal results are displayed) Labs Reviewed  CBC WITH DIFFERENTIAL/PLATELET - Abnormal; Notable for the following components:      Result Value   Platelets 139 (*)    All other components within normal limits  COMPREHENSIVE METABOLIC PANEL - Abnormal; Notable for the following components:   Glucose, Bld 158 (*)    All other components within normal limits   URINALYSIS, ROUTINE W REFLEX MICROSCOPIC - Abnormal; Notable for the following components:   Ketones, ur 5 (*)    All other components within normal limits  URINE CULTURE    EKG None  Radiology No results found.  Procedures Procedures    Medications Ordered in ED Medications  acetaminophen (TYLENOL) tablet 1,000 mg (1,000 mg Oral Given 11/14/21 0153)    ED Course/ Medical Decision Making/ A&P                           Medical Decision Making Amount and/or Complexity of Data Reviewed Labs: ordered.  Risk OTC drugs. Prescription drug management.   No uterus. No e/o injury. Pelvic exam with possible atrophic vaginitis, will try estrogen cream. Gyn follow up. Mother states petechiae are normal for her. No hematologic changes on cbc.   Final Clinical Impression(s) / ED Diagnoses Final diagnoses:  Vaginal bleeding    Rx / DC Orders ED Discharge Orders          Ordered    conjugated estrogens (PREMARIN) vaginal cream  Daily,   Status:  Discontinued        11/14/21 0246    conjugated estrogens (PREMARIN) vaginal cream  Daily        11/14/21 0247              Rahma Meller, Corene Cornea, MD 11/14/21 [phone]

## 2021-11-14 NOTE — Telephone Encounter (Signed)
Pt's mother, Eddie North cancelled pt's appt due to unable to come to appt.

## 2021-11-14 NOTE — ED Triage Notes (Signed)
Pt started having vaginal bleeding this afternoon per mother. Pt brought in by brother and her mother is caregiver.

## 2021-11-15 LAB — URINE CULTURE: Culture: NO GROWTH

## 2021-11-16 ENCOUNTER — Ambulatory Visit (HOSPITAL_COMMUNITY): Payer: Medicare Other | Attending: Internal Medicine

## 2021-11-16 ENCOUNTER — Encounter (HOSPITAL_COMMUNITY): Payer: Self-pay

## 2021-11-16 DIAGNOSIS — W19XXXD Unspecified fall, subsequent encounter: Secondary | ICD-10-CM | POA: Diagnosis not present

## 2021-11-16 DIAGNOSIS — R269 Unspecified abnormalities of gait and mobility: Secondary | ICD-10-CM | POA: Diagnosis present

## 2021-11-16 DIAGNOSIS — R2689 Other abnormalities of gait and mobility: Secondary | ICD-10-CM

## 2021-11-16 DIAGNOSIS — M6281 Muscle weakness (generalized): Secondary | ICD-10-CM

## 2021-11-16 NOTE — Therapy (Signed)
OUTPATIENT PHYSICAL THERAPY NEURO EVALUATION   Patient Name: Maria [name] MRN: [id number] DOB:18-Jul-1960, 61 y.o., female Today's Date: 11/16/2021   PCP: Allyn Kenner REFERRING PROVIDER: Alric Ran   PT End of Session - 11/16/21 1420     Visit Number 3    Number of Visits 10    Date for PT Re-Evaluation 12/11/21    Authorization Type [employer] for [employer] (follows MCR guidelines)    Progress Note Due on Visit 10    PT Start Time 1413   late arrival   PT Stop Time 1445    PT Time Calculation (min) 32 min    Behavior During Therapy Flat affect              Past Medical History:  Diagnosis Date   Constipation    Dysphagia    Fracture    R foot   GERD (gastroesophageal reflux disease)    History of shingles 10/2016   Mental retardation    lesion in head   Seizures ([place])    "not fully controlled on max doses of meds" (Neuro ofc note 12/2014)   Thrombocytopenia ([place])    Tremor    Past Surgical History:  Procedure Laterality Date   BIOPSY  09/16/2014   Procedure: BIOPSY;  Surgeon: Rogene Houston, MD;  Location: [facility];  Service: Endoscopy;;   CATARACT EXTRACTION     both eyes, May of 2015   COLONOSCOPY     COLONOSCOPY WITH PROPOFOL N/A 11/20/2018   Procedure: COLONOSCOPY WITH PROPOFOL;  Surgeon: Rogene Houston, MD;  Location: [facility];  Service: Endoscopy;  Laterality: N/A;   ESOPHAGEAL DILATION N/A 09/16/2014   Procedure: ESOPHAGEAL DILATION WITH 54FR MALONEY DILATOR;  Surgeon: Rogene Houston, MD;  Location: [facility];  Service: Endoscopy;  Laterality: N/A;   ESOPHAGOGASTRODUODENOSCOPY (EGD) WITH PROPOFOL N/A 09/16/2014   Procedure: ESOPHAGOGASTRODUODENOSCOPY (EGD) WITH PROPOFOL;  Surgeon: Rogene Houston, MD;  Location: [facility];  Service: Endoscopy;  Laterality: N/A;   LAPAROSCOPIC APPENDECTOMY N/A 07/14/2020   Procedure: APPENDECTOMY LAPAROSCOPIC;  Surgeon: Virl Cagey, MD;  Location: [facility];  Service: General;  Laterality: N/A;   MOUTH SURGERY      POLYPECTOMY  11/20/2018   Procedure: POLYPECTOMY;  Surgeon: Rogene Houston, MD;  Location: [facility];  Service: Endoscopy;;  colon   Skin graft to gum Right 08/2013   TONSILLECTOMY AND ADENOIDECTOMY     TOTAL ABDOMINAL HYSTERECTOMY     Patient Active Problem List   Diagnosis Date Noted   Pain in right shoulder 08/24/2021   Clavicle pain 08/15/2021   Acute urinary tract infection 03/13/2021   Dysuria 03/13/2021   COVID-19 02/19/2021   Allergic rhinitis 10/24/2020   Anemia 10/24/2020   Anxiety 10/24/2020   Heart failure ([place]) 10/24/2020   History of sepsis 10/24/2020   Mixed hyperlipidemia 10/24/2020   Neck pain 10/24/2020   Osteopenia 10/24/2020   Overactive bladder 10/24/2020   Polyp of colon 10/24/2020   ESBL (extended spectrum beta-lactamase) producing bacteria infection 05/12/2020   Bacteremia due to Escherichia coli 05/12/2020   Severe sepsis ([place]) 05/12/2020   Debility 05/05/2020   Pressure injury of skin 04/24/2020   Palliative care by specialist    Pyelonephritis of left kidney 04/14/2020   Renal abscess, left 04/14/2020   Leukocytosis 04/14/2020   Fever 04/14/2020   LLQ abdominal pain 04/14/2020   AKI (acute kidney injury) ([place])    Sepsis secondary to UTI ([place])    Pain in  left ankle and joints of left foot 12/02/2019   History of epilepsy 08/04/2019   Positive colorectal cancer screening using Cologuard test 10/28/2018   Posterior tibial tendinitis, right leg 04/23/2016   Pain in right foot 04/12/2016   Acute encephalopathy    Myoclonus    Frequent falls 11/09/2015   Fall 11/09/2015   Chest pain 03/16/2014   Upper abdominal pain 03/16/2014   Seizure disorder ([place]) 03/16/2014   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   Generalized convulsive epilepsy ([place]) 10/06/2012   DNR (do not resuscitate) discussion 10/06/2012   Intellectual disability    Thrombocytopenia ([place]) 05/23/2011   Seizures ([place]) 03/21/2011   GERD (gastroesophageal reflux disease)  03/21/2011   CLOSED FRACTURE OF ACROMIAL END OF CLAVICLE 07/13/2008    ONSET DATE: 07/2021  REFERRING DIAG: K99.XXXD (ICD-10-CM) - Fall, subsequent encounter R26.9 (ICD-10-CM) - Gait abnormality   THERAPY DIAG:  Other abnormalities of gait and mobility  Muscle weakness (generalized)  Rationale for Evaluation and Treatment Rehabilitation  SUBJECTIVE:                                                                                                                                                                                              SUBJECTIVE STATEMENT: Pt late for apt, stated her brother took her to wrong office.  Pt stated she has high pain with lower back, no pain scale given today.  FROM IE: Dx with epilepsy since 61 years old, pt has a life long history of seizures. Pt spends a lot of time sleeping because of medication. Patient recently followed up with neurologist who recommended PT. Pt has had several falls, not due to seizure. Patient now presents for therapy at MDs referral.  Pt accompanied by: family member  PERTINENT HISTORY:  epilepsy since 61 years old  PAIN:  Are you having pain? Yes: NPRS scale: unable to report/10 Pain location: low back pain  Pain description: fairly frequent back pain  Aggravating factors: walking  Relieving factors: resting   PRECAUTIONS: None  WEIGHT BEARING RESTRICTIONS No  FALLS: Has patient fallen in last 6 months? Yes. Number of falls 3. February/march of 2023  LIVING ENVIRONMENT: Lives with: lives with their family Lives in: House/apartment Stairs: Yes: Internal: 54fs  steps; yes HR  Has following equipment at home: Single point cane, Walker - 2 wheeled, Shower bench, and Grab bars  PLOF:  needed help with cooking, medication, laundry. Pt  is independent in walking in home, is without caretaker at times. Able to do a lot for herself.   PATIENT GOALS  improve walking, improve balance    OBJECTIVE:  DIAGNOSTIC FINDINGS:   Impression: This is an abnormal EEG recording in the waking and drowsy state due to presence of frequent spike and slow wave discharges which is consistent with a generalized epileptogenic potential as seen in patient with generalized epilepsy and diffuse slowing which is consistent with a generalized brain dysfunction such as in encephalopathy In the ER 10/10/20, reporting a few falls over the several days, because her legs gave out. MRI of the brain and head CT was negative for acute findings.  CT lumbar spine was negative, did show spondylosis of the lumbar spine, x-ray of elbow and knee were negative.  She refused a left hip x-ray. CBC, CMP were unremarkable, glucose was 102, BUN 21, creatinine 0.83.    COGNITION: Overall cognitive status: Impaired: increased time for processing    SENSATION: WFL  COORDINATION: Impaired LE.    POSTURE: rounded shoulders and forward head    LOWER EXTREMITY MMT:    MMT Right Eval Left Eval  Hip flexion 5 5  Hip extension 4 single leg bridge  4  Hip abduction    Hip adduction 2 2  Hip internal rotation    Hip external rotation    Knee flexion 3- 3-  Knee extension 5 5  Ankle dorsiflexion 5 5  Ankle plantarflexion    Ankle inversion 5 5  Ankle eversion 5 5  (Blank rows = not tested) Patient with difficulty following commands to hold or move in certain manner during MMT  BED MOBILITY:  independent  BERG  1. SITTING TO STANDING  4 able to stand without using hands and stabilize independently  2. STANDING UNSUPPORTED  4 able to stand safely for 2 minutes  If a subject is able to stand 2 minutes unsupported, score full points for sitting unsupported. Proceed to item #4  3. SITTING WITH BACK UNSUPPORTED BUT FEET SUPPORTED ON FLOOR OR ON A STOOL 4 able to sit safely and securely for 2 minutes  4. STANDING TO SITTING 4 sits safely with minimal use of hands  5. TRANSFERS 4 able to transfer safely with minor use of hands  6.  STANDING UNSUPPORTED WITH EYES CLOSED 4 able to stand 10 seconds safely   7. STANDING UNSUPPORTED WITH FEET TOGETHER 1 needs help to attain position but able to stand 15 seconds feet together  8. REACHING FORWARD WITH OUTSTRETCHED ARM WHILE STANDING 2 can reach forward 5 cm (2 inches  9. PICK UP OBJECT FROM THE FLOOR FROM A STANDING POSITION 0 unable to try/needs assist to keep from losing balance or falling Pt will not attempt is fearful that she will fall if trying.   10. TURNING TO LOOK BEHIND OVER LEFT AND RIGHT SHOULDERS WHILE STANDING  2 turns sideways only but maintains balance  11. TURN 360 DEGREES 2 able to turn 360 degrees safely but slowly  12. PLACE ALTERNATE FOOT ON STEP OR STOOL WHILE STANDING UNSUPPORTED 3 able to stand independently and complete 8 steps in > 20 seconds   13. STANDING UNSUPPORTED ONE FOOT IN FRONT 2 able to take small step independently and hold 30 seconds  14. STANDING ON ONE LEG 3 able to lift leg independently and hold 5-10 seconds   TOTAL SCORE  39/56 Score of < 45 indicates individuals may be at greater risk of falling Merrilee Jansky, 1992)           TRANSFERS:  Sit to stand: Complete Independence Stand to sit: Complete Independence Chair to chair: Complete Independence  GAIT: Gait pattern:  decreased cadence, step through pattern, decreased arm swing- Right, decreased arm swing- Left, decreased step length- Right, decreased step length- Left, and decreased stride length Distance walked: 282'  Assistive device utilized: None Level of assistance: Complete Independence   FUNCTIONAL TESTs:  5 times sit to stand: 14.09 seconds multi attempts to rise  2 minute walk test: 282'  Berg Balance Scale: 39/56    TODAY'S TREATMENT:  11/16/21: Seated posture education  RTB row 2x 10  STS eccentric control 10x Toe tapping 6in step height alternating with 1 UE support Tandem stance 2x 30 Sidestep with HHA inside // bars [place] 3RT  inside //bars SLS with 1 HHA 2x 30"    11/12/21 - Tandem Stance with Support  - 1 x daily - 7 x weekly - 1 sets - 10 reps - 10 hold - Tandem Walking with Counter Support  - 1 x daily - 7 x weekly - 3 sets - 10 reps - [place] with Unilateral Counter Support  - 1 x daily - 7 x weekly - 3 sets - 10 reps - Single Leg Stance with Support  - 1 x daily - 7 x weekly - 1 sets - 10 reps - 10 hold - Heel Raises with Counter Support  - 1 x daily - 7 x weekly - 3 sets - 10 reps - Standing March with Counter Support  - 1 x daily - 7 x weekly - 3 sets - 10 reps - 2 hold - Sit to Stand Without Arm Support  - 1 x daily - 7 x weekly - 2 sets - 10 reps - Supine Bridge with Mini Swiss Ball Between Knees  - 1 x daily - 7 x weekly - 2 sets - 10 reps - 3 hold  Evaluation    PATIENT EDUCATION:  Education details: Patient ans family educated on exam findings, POC, scope of PT Person educated: Patient and family  Education method: Explanation, Demonstration, and Handouts Education comprehension: verbalized understanding, returned demonstration, verbal cues required, and tactile cues required   HOME EXERCISE PROGRAM: Access Code: [email or web address] URL: [email or web address] Date: 11/12/2021 Prepared by: Leota Jacobsen  Exercises - Tandem Stance with Support  - 1 x daily - 7 x weekly - 1 sets - 10 reps - 10 hold - Tandem Walking with Counter Support  - 1 x daily - 7 x weekly - 3 sets - 10 reps - [place] with Unilateral Counter Support  - 1 x daily - 7 x weekly - 3 sets - 10 reps - Single Leg Stance with Support  - 1 x daily - 7 x weekly - 1 sets - 10 reps - 10 hold - Heel Raises with Counter Support  - 1 x daily - 7 x weekly - 3 sets - 10 reps - Standing March with Counter Support  - 1 x daily - 7 x weekly - 3 sets - 10 reps - 2 hold - Sit to Stand Without Arm Support  - 1 x daily - 7 x weekly - 2 sets - 10 reps - Supine Bridge with Mini Swiss Ball Between Knees  - 1 x daily - 7 x weekly - 2 sets - 10  reps - 3 hold    GOALS: Goals reviewed with patient? Yes  SHORT TERM GOALS: Target date: 11/30/2021  Patient will be independent with HEP in order to improve functional outcomes. Baseline:  Goal status: IN PROGRESS  2.  Patient will improve five time sit to  stand by 5 seconds to improve efficiency and safety with tranfers.  Baseline: 14.09 Goal status: IN PROGRESS   LONG TERM GOALS: Target date: 12/21/2021  1.  Patient will improve 2MWT  by 100'   to improve safety, efficiency and ability to navigate home and community.  Baseline: 282' Goal status: IN PROGRESS  2.  Patient will improve adductor  strength by 1/2 grade to work towards improved performance of transfers, stairs and ambulation.  Baseline: 2 Goal status: IN PROGRESS  3. Patient will score at least 5 point on BERG  in order to demonstrate improved balance to reduce the risk for falls at home and in the community.  Baseline: 39/56 Goal status: IN PROGRESS  ASSESSMENT:  CLINICAL IMPRESSION: Pt arrived wearing tennis shoes, limited by LBP this session.  Presents with forward head and rolled shoulder.  Pt educated on importance of posture to reduce back pain and improve balance though required cueing through session as tendency to look at floor.  Pt unable to demonstrate appropriate form with multimodal cueing required, pt response "she does not wish to do" with seated posture exercises.  Pt hesitant with less UE support during balance activities though assured // available and therapist SBA with all activities.     From IE Patient a 61 y.o. y.o. female who was seen today for physical therapy evaluation and treatment for gait and balance impairment.  Pt with history of epilepsy since childhood. Pt requires increased time for processing and direction. Patients answers to questions do not always correlate to the question asked. Pt presents today after having an increase in falls in the home. Pt presents with weakness in  hamstrings and adductors though pt understanding of direction is in question. Pt with increased difficulty with narrow base balance and coordination into these positions. Patient is somewhat self limiting in these tasks secondary to decreased confidence and fear of falling. Patient ambulates with shorter stride, decreased arm swing, decreased cadence. Pt with reports of low back pain that mother reports is fairly frequent. Patient will benefit from skilled PT services to improve strength, balance and functional mobility. Current plan is for 5 weeks and then re-evaluate what benefits patient is getting from PT.     OBJECTIVE IMPAIRMENTS Abnormal gait, decreased balance, decreased cognition, decreased coordination, decreased mobility, decreased strength, and pain.   ACTIVITY LIMITATIONS standing, squatting, and balance  PARTICIPATION LIMITATIONS: cleaning, shopping, community activity, and yard work  PERSONAL FACTORS 3+ comorbidities: anxiety, leukocytosis, osteopenia, epilepsy  are also affecting patient's functional outcome.   REHAB POTENTIAL: Fair direction following   CLINICAL DECISION MAKING: Evolving/moderate complexity  EVALUATION COMPLEXITY: Moderate  PLAN: PT FREQUENCY: 2x/week  PT DURATION: other: 5 weeks and then reassess benefits from PT.   PLANNED INTERVENTIONS: Therapeutic exercises, Therapeutic activity, Neuromuscular re-education, Balance training, Gait training, Patient/Family education, Joint manipulation, Joint mobilization, Stair training, Orthotic/Fit training, DME instructions, Aquatic Therapy, Dry Needling, Electrical stimulation, Spinal manipulation, Spinal mobilization, Cryotherapy, Moist heat, Compression bandaging, scar mobilization, Splintting, Taping, Traction, Ultrasound, Ionotophoresis '4mg'$ /ml Dexamethasone, and Manual therapy   PLAN FOR NEXT SESSION: continue with gait and balance exercises  Ihor Austin, LPTA/CLT; CBIS [phone]  Aldona Lento,  PTA 11/16/2021, 3:03 PM

## 2021-11-19 ENCOUNTER — Encounter (HOSPITAL_COMMUNITY): Payer: TRICARE For Life (TFL) | Admitting: Physical Therapy

## 2021-11-19 ENCOUNTER — Ambulatory Visit (INDEPENDENT_AMBULATORY_CARE_PROVIDER_SITE_OTHER): Payer: TRICARE For Life (TFL) | Admitting: Gastroenterology

## 2021-11-19 DIAGNOSIS — N950 Postmenopausal bleeding: Secondary | ICD-10-CM | POA: Diagnosis not present

## 2021-11-19 DIAGNOSIS — L292 Pruritus vulvae: Secondary | ICD-10-CM | POA: Diagnosis not present

## 2021-11-20 ENCOUNTER — Telehealth (HOSPITAL_COMMUNITY): Payer: Self-pay

## 2021-11-20 ENCOUNTER — Encounter (HOSPITAL_COMMUNITY): Payer: TRICARE For Life (TFL)

## 2021-11-20 NOTE — Telephone Encounter (Signed)
Patient's parent called to cx this apptment they are not well enough to bring her today

## 2021-11-23 ENCOUNTER — Ambulatory Visit (HOSPITAL_COMMUNITY): Payer: TRICARE For Life (TFL)

## 2021-11-26 ENCOUNTER — Telehealth (HOSPITAL_COMMUNITY): Payer: Self-pay

## 2021-11-26 ENCOUNTER — Other Ambulatory Visit: Payer: Self-pay | Admitting: Urology

## 2021-11-26 DIAGNOSIS — R351 Nocturia: Secondary | ICD-10-CM

## 2021-11-27 ENCOUNTER — Encounter (HOSPITAL_COMMUNITY): Payer: TRICARE For Life (TFL)

## 2021-11-29 ENCOUNTER — Encounter (HOSPITAL_COMMUNITY): Payer: TRICARE For Life (TFL)

## 2021-11-30 ENCOUNTER — Other Ambulatory Visit (INDEPENDENT_AMBULATORY_CARE_PROVIDER_SITE_OTHER): Payer: Self-pay | Admitting: Gastroenterology

## 2021-11-30 DIAGNOSIS — K219 Gastro-esophageal reflux disease without esophagitis: Secondary | ICD-10-CM

## 2021-12-05 ENCOUNTER — Ambulatory Visit (HOSPITAL_COMMUNITY): Payer: Medicare Other | Attending: Neurology | Admitting: Physical Therapy

## 2021-12-05 DIAGNOSIS — R5381 Other malaise: Secondary | ICD-10-CM | POA: Insufficient documentation

## 2021-12-05 DIAGNOSIS — M6281 Muscle weakness (generalized): Secondary | ICD-10-CM | POA: Insufficient documentation

## 2021-12-05 DIAGNOSIS — R2689 Other abnormalities of gait and mobility: Secondary | ICD-10-CM | POA: Insufficient documentation

## 2021-12-05 DIAGNOSIS — R569 Unspecified convulsions: Secondary | ICD-10-CM | POA: Diagnosis not present

## 2021-12-05 NOTE — Therapy (Addendum)
OUTPATIENT PHYSICAL THERAPY TREATMENT  Patient Name: Maria [name] MRN: [id number] DOB:[birth year], 61 y.o., female Today's Date: 12/05/2021   PCP: Allyn Kenner REFERRING PROVIDER: Alric Ran   PT End of Session - 12/05/21 1543     Visit Number 4    Number of Visits 10    Date for PT Re-Evaluation 12/11/21    Authorization Type [employer] for [employer] (follows MCR guidelines)    Progress Note Due on Visit 10    PT Start Time 9417    PT Stop Time 1515    PT Time Calculation (min) 41 min    Behavior During Therapy Flat affect               Past Medical History:  Diagnosis Date   Constipation    Dysphagia    Fracture    R foot   GERD (gastroesophageal reflux disease)    History of shingles 10/2016   Mental retardation    lesion in head   Seizures ([place])    "not fully controlled on max doses of meds" (Neuro ofc note 12/2014)   Thrombocytopenia ([place])    Tremor    Past Surgical History:  Procedure Laterality Date   BIOPSY  09/16/2014   Procedure: BIOPSY;  Surgeon: Rogene Houston, MD;  Location: [facility];  Service: Endoscopy;;   CATARACT EXTRACTION     both eyes, May of 2015   COLONOSCOPY     COLONOSCOPY WITH PROPOFOL N/A 11/20/2018   Procedure: COLONOSCOPY WITH PROPOFOL;  Surgeon: Rogene Houston, MD;  Location: [facility];  Service: Endoscopy;  Laterality: N/A;   ESOPHAGEAL DILATION N/A 09/16/2014   Procedure: ESOPHAGEAL DILATION WITH 54FR MALONEY DILATOR;  Surgeon: Rogene Houston, MD;  Location: [facility];  Service: Endoscopy;  Laterality: N/A;   ESOPHAGOGASTRODUODENOSCOPY (EGD) WITH PROPOFOL N/A 09/16/2014   Procedure: ESOPHAGOGASTRODUODENOSCOPY (EGD) WITH PROPOFOL;  Surgeon: Rogene Houston, MD;  Location: [facility];  Service: Endoscopy;  Laterality: N/A;   LAPAROSCOPIC APPENDECTOMY N/A 07/14/2020   Procedure: APPENDECTOMY LAPAROSCOPIC;  Surgeon: Virl Cagey, MD;  Location: [facility];  Service: General;  Laterality: N/A;   MOUTH SURGERY     POLYPECTOMY  11/20/2018    Procedure: POLYPECTOMY;  Surgeon: Rogene Houston, MD;  [facility]: [facility];  Service: Endoscopy;;  colon   Skin graft to gum Right 08/2013   TONSILLECTOMY AND ADENOIDECTOMY     TOTAL ABDOMINAL HYSTERECTOMY     Patient Active Problem List   Diagnosis Date Noted   Pain in right shoulder 08/24/2021   Clavicle pain 08/15/2021   Acute urinary tract infection 03/13/2021   Dysuria 03/13/2021   COVID-19 02/19/2021   Allergic rhinitis 10/24/2020   Anemia 10/24/2020   Anxiety 10/24/2020   Heart failure ([place]) 10/24/2020   History of sepsis 10/24/2020   Mixed hyperlipidemia 10/24/2020   Neck pain 10/24/2020   Osteopenia 10/24/2020   Overactive bladder 10/24/2020   Polyp of colon 10/24/2020   ESBL (extended spectrum beta-lactamase) producing bacteria infection 05/12/2020   Bacteremia due to Escherichia coli 05/12/2020   Severe sepsis ([place]) 05/12/2020   Debility 05/05/2020   Pressure injury of skin 04/24/2020   Palliative care by specialist    Pyelonephritis of left kidney 04/14/2020   Renal abscess, left 04/14/2020   Leukocytosis 04/14/2020   Fever 04/14/2020   LLQ abdominal pain 04/14/2020   AKI (acute kidney injury) ([place])    Sepsis secondary to UTI ([place])    Pain in left ankle and joints  of left foot 12/02/2019   History of epilepsy 08/04/2019   Positive colorectal cancer screening using Cologuard test 10/28/2018   Posterior tibial tendinitis, right leg 04/23/2016   Pain in right foot 04/12/2016   Acute encephalopathy    Myoclonus    Frequent falls 11/09/2015   Fall 11/09/2015   Chest pain 03/16/2014   Upper abdominal pain 03/16/2014   Seizure disorder ([place]) 03/16/2014   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   Generalized convulsive epilepsy ([place]) 10/06/2012   DNR (do not resuscitate) discussion 10/06/2012   Intellectual disability    Thrombocytopenia ([place]) 05/23/2011   Seizures ([place]) 03/21/2011   GERD (gastroesophageal reflux disease) 03/21/2011   CLOSED FRACTURE  OF ACROMIAL END OF CLAVICLE 07/13/2008    ONSET DATE: 07/2021  REFERRING DIAG: G01.XXXD (ICD-10-CM) - Fall, subsequent encounter R26.9 (ICD-10-CM) - Gait abnormality   THERAPY DIAG:  Other abnormalities of gait and mobility  Muscle weakness (generalized)  Rationale for Evaluation and Treatment Rehabilitation  SUBJECTIVE:                                                                                                                                                                                              SUBJECTIVE STATEMENT: Pt states she is doing okay today.  No complaints  FROM IE: Dx with epilepsy since 61 years old, pt has a life long history of seizures. Pt spends a lot of time sleeping because of medication. Patient recently followed up with neurologist who recommended PT. Pt has had several falls, not due to seizure. Patient now presents for therapy at MDs referral.  Pt accompanied by: family member  PERTINENT HISTORY:  epilepsy since 60 years old  PAIN:  Are you having pain? Yes: NPRS scale: unable to report/10 Pain location: low back pain  Pain description: fairly frequent back pain  Aggravating factors: walking  Relieving factors: resting   PRECAUTIONS: None  WEIGHT BEARING RESTRICTIONS No  FALLS: Has patient fallen in last 6 months? Yes. Number of falls 3. February/march of 2023  LIVING ENVIRONMENT: Lives with: lives with their family Lives in: House/apartment Stairs: Yes: Internal: 73fs  steps; yes HR  Has following equipment at home: Single point cane, Walker - 2 wheeled, Shower bench, and Grab bars  PLOF:  needed help with cooking, medication, laundry. Pt  is independent in walking in home, is without caretaker at times. Able to do a lot for herself.   PATIENT GOALS  improve walking, improve balance    OBJECTIVE:   DIAGNOSTIC FINDINGS:  Impression: This is an abnormal EEG recording in the waking and drowsy state due to presence  of frequent spike and  slow wave discharges which is consistent with a generalized epileptogenic potential as seen in patient with generalized epilepsy and diffuse slowing which is consistent with a generalized brain dysfunction such as in encephalopathy In the ER 10/10/20, reporting a few falls over the several days, because her legs gave out. MRI of the brain and head CT was negative for acute findings.  CT lumbar spine was negative, did show spondylosis of the lumbar spine, x-ray of elbow and knee were negative.  She refused a left hip x-ray. CBC, CMP were unremarkable, glucose was 102, BUN 21, creatinine 0.83.    COGNITION: Overall cognitive status: Impaired: increased time for processing    SENSATION: WFL  COORDINATION: Impaired LE.    POSTURE: rounded shoulders and forward head    LOWER EXTREMITY MMT:    MMT Right Eval Left Eval  Hip flexion 5 5  Hip extension 4 single leg bridge  4  Hip abduction    Hip adduction 2 2  Hip internal rotation    Hip external rotation    Knee flexion 3- 3-  Knee extension 5 5  Ankle dorsiflexion 5 5  Ankle plantarflexion    Ankle inversion 5 5  Ankle eversion 5 5  (Blank rows = not tested) Patient with difficulty following commands to hold or move in certain manner during MMT  BED MOBILITY:  independent  BERG  1. SITTING TO STANDING  4 able to stand without using hands and stabilize independently  2. STANDING UNSUPPORTED  4 able to stand safely for 2 minutes  If a subject is able to stand 2 minutes unsupported, score full points for sitting unsupported. Proceed to item #4  3. SITTING WITH BACK UNSUPPORTED BUT FEET SUPPORTED ON FLOOR OR ON A STOOL 4 able to sit safely and securely for 2 minutes  4. STANDING TO SITTING 4 sits safely with minimal use of hands  5. TRANSFERS 4 able to transfer safely with minor use of hands  6. STANDING UNSUPPORTED WITH EYES CLOSED 4 able to stand 10 seconds safely   7. STANDING UNSUPPORTED WITH FEET TOGETHER 1  needs help to attain position but able to stand 15 seconds feet together  8. REACHING FORWARD WITH OUTSTRETCHED ARM WHILE STANDING 2 can reach forward 5 cm (2 inches  9. PICK UP OBJECT FROM THE FLOOR FROM A STANDING POSITION 0 unable to try/needs assist to keep from losing balance or falling Pt will not attempt is fearful that she will fall if trying.   10. TURNING TO LOOK BEHIND OVER LEFT AND RIGHT SHOULDERS WHILE STANDING  2 turns sideways only but maintains balance  11. TURN 360 DEGREES 2 able to turn 360 degrees safely but slowly  12. PLACE ALTERNATE FOOT ON STEP OR STOOL WHILE STANDING UNSUPPORTED 3 able to stand independently and complete 8 steps in > 20 seconds   13. STANDING UNSUPPORTED ONE FOOT IN FRONT 2 able to take small step independently and hold 30 seconds  14. STANDING ON ONE LEG 3 able to lift leg independently and hold 5-10 seconds   TOTAL SCORE  39/56 Score of < 45 indicates individuals may be at greater risk of falling Merrilee Jansky, 1992)           TRANSFERS:  Sit to stand: Complete Independence Stand to sit: Complete Independence Chair to chair: Complete Independence    GAIT: Gait pattern:  decreased cadence, step through pattern, decreased arm swing- Right, decreased arm swing- Left, decreased step  length- Right, decreased step length- Left, and decreased stride length Distance walked: 282'  Assistive device utilized: None Level of assistance: Complete Independence   FUNCTIONAL TESTs:  5 times sit to stand: 14.09 seconds multi attempts to rise  2 minute walk test: 282'  Berg Balance Scale: 39/56    TODAY'S TREATMENT:   12/05/21 Standing:  heelraises 15X  Tandem stance 5 trials max of 15-20 seconds without UE assist  SLS 5 trials each max of 5-8 seconds without UE assist  RTB rows 2X10  RTB extensions 2X10  Toe tapping 6" alternating LE with 1 HHA  Lateral step ups 6" 10X each with 1 HHA  Forward step ups 6"  10X each with 1 HHA  Side  stepping in // bars 5RT  Grapevine in // bars 3RT Seated: sit to stands 10X 2 eccentric lowering no UE assist   11/16/21: Seated posture education  RTB row 2x 10  STS eccentric control 10x Toe tapping 6in step height alternating with 1 UE support Tandem stance 2x 30 Sidestep with HHA inside // bars Grapevine 3RT inside //bars SLS with 1 HHA 2x 30"    11/12/21 - Tandem Stance with Support  - 1 x daily - 7 x weekly - 1 sets - 10 reps - 10 hold - Tandem Walking with Counter Support  - 1 x daily - 7 x weekly - 3 sets - 10 reps - [place] with Unilateral Counter Support  - 1 x daily - 7 x weekly - 3 sets - 10 reps - Single Leg Stance with Support  - 1 x daily - 7 x weekly - 1 sets - 10 reps - 10 hold - Heel Raises with Counter Support  - 1 x daily - 7 x weekly - 3 sets - 10 reps - Standing March with Counter Support  - 1 x daily - 7 x weekly - 3 sets - 10 reps - 2 hold - Sit to Stand Without Arm Support  - 1 x daily - 7 x weekly - 2 sets - 10 reps - Supine Bridge with Mini Swiss Ball Between Knees  - 1 x daily - 7 x weekly - 2 sets - 10 reps - 3 hold  Evaluation    PATIENT EDUCATION:  Education details: Patient ans family educated on exam findings, POC, scope of PT Person educated: Patient and family  Education method: Explanation, Demonstration, and Handouts Education comprehension: verbalized understanding, returned demonstration, verbal cues required, and tactile cues required   HOME EXERCISE PROGRAM: Access Code: [email or web address] URL: [email or web address] Date: 11/12/2021 Prepared by: Leota Jacobsen  Exercises - Tandem Stance with Support  - 1 x daily - 7 x weekly - 1 sets - 10 reps - 10 hold - Tandem Walking with Counter Support  - 1 x daily - 7 x weekly - 3 sets - 10 reps - Carioca with Unilateral Counter Support  - 1 x daily - 7 x weekly - 3 sets - 10 reps - Single Leg Stance with Support  - 1 x daily - 7 x weekly - 1 sets - 10 reps - 10 hold - Heel Raises with  Counter Support  - 1 x daily - 7 x weekly - 3 sets - 10 reps - Standing March with Counter Support  - 1 x daily - 7 x weekly - 3 sets - 10 reps - 2 hold - Sit to Stand Without Arm Support  - 1 x daily - 7 x weekly - 2 sets -  10 reps - Supine Bridge with Mini Swiss Ball Between Knees  - 1 x daily - 7 x weekly - 2 sets - 10 reps - 3 hold    GOALS: Goals reviewed with patient? Yes  SHORT TERM GOALS: Target date: 12/19/2021  Patient will be independent with HEP in order to improve functional outcomes. Baseline:  Goal status: IN PROGRESS  2.  Patient will improve five time sit to stand by 5 seconds to improve efficiency and safety with tranfers.  Baseline: 14.09 Goal status: IN PROGRESS   LONG TERM GOALS: Target date: 01/09/2022  1.  Patient will improve 2MWT  by 100'   to improve safety, efficiency and ability to navigate home and community.  Baseline: 282' Goal status: IN PROGRESS  2.  Patient will improve adductor  strength by 1/2 grade to work towards improved performance of transfers, stairs and ambulation.  Baseline: 2 Goal status: IN PROGRESS  3. Patient will score at least 5 point on BERG  in order to demonstrate improved balance to reduce the risk for falls at home and in the community.  Baseline: 39/56 Goal status: IN PROGRESS  ASSESSMENT:  CLINICAL IMPRESSION: Continued with focus on improving balance and LE strength.  Constant cues to correct forward head and posturing, however only able to maintain briefly.  Difficulty with achieving challenge with balance as either needs UE assist or maintains forward bent posturing. Pt with impulsive tendencies with challenge to follow cues correctly.  Grapevine difficult due to coordinating the activity while maintaining upright. Pt required two short seated rest breaks.  Pt will continue to benefit from skilled physical therapy.  From IE Patient a 61 y.o. y.o. female who was seen today for physical therapy evaluation and treatment  for gait and balance impairment.  Pt with history of epilepsy since childhood. Pt requires increased time for processing and direction. Patients answers to questions do not always correlate to the question asked. Pt presents today after having an increase in falls in the home. Pt presents with weakness in hamstrings and adductors though pt understanding of direction is in question. Pt with increased difficulty with narrow base balance and coordination into these positions. Patient is somewhat self limiting in these tasks secondary to decreased confidence and fear of falling. Patient ambulates with shorter stride, decreased arm swing, decreased cadence. Pt with reports of low back pain that mother reports is fairly frequent. Patient will benefit from skilled PT services to improve strength, balance and functional mobility. Current plan is for 5 weeks and then re-evaluate what benefits patient is getting from PT.     OBJECTIVE IMPAIRMENTS Abnormal gait, decreased balance, decreased cognition, decreased coordination, decreased mobility, decreased strength, and pain.   ACTIVITY LIMITATIONS standing, squatting, and balance  PARTICIPATION LIMITATIONS: cleaning, shopping, community activity, and yard work  PERSONAL FACTORS 3+ comorbidities: anxiety, leukocytosis, osteopenia, epilepsy  are also affecting patient's functional outcome.   REHAB POTENTIAL: Fair direction following   CLINICAL DECISION MAKING: Evolving/moderate complexity  EVALUATION COMPLEXITY: Moderate  PLAN: PT FREQUENCY: 2x/week  PT DURATION: other: 5 weeks and then reassess benefits from PT.   PLANNED INTERVENTIONS: Therapeutic exercises, Therapeutic activity, Neuromuscular re-education, Balance training, Gait training, Patient/Family education, Joint manipulation, Joint mobilization, Stair training, Orthotic/Fit training, DME instructions, Aquatic Therapy, Dry Needling, Electrical stimulation, Spinal manipulation, Spinal mobilization,  Cryotherapy, Moist heat, Compression bandaging, scar mobilization, Splintting, Taping, Traction, Ultrasound, Ionotophoresis '4mg'$ /ml Dexamethasone, and Manual therapy   PLAN FOR NEXT SESSION: continue with gait and balance exercises  Teena Irani, PTA/CLT [place] Ph: [phone]  Teena Irani, PTA 12/05/2021, 3:45 PM

## 2021-12-07 ENCOUNTER — Ambulatory Visit (HOSPITAL_COMMUNITY): Payer: Medicare Other | Admitting: Physical Therapy

## 2021-12-07 DIAGNOSIS — R2689 Other abnormalities of gait and mobility: Secondary | ICD-10-CM

## 2021-12-07 DIAGNOSIS — M6281 Muscle weakness (generalized): Secondary | ICD-10-CM | POA: Diagnosis not present

## 2021-12-07 DIAGNOSIS — R5381 Other malaise: Secondary | ICD-10-CM

## 2021-12-07 DIAGNOSIS — R569 Unspecified convulsions: Secondary | ICD-10-CM | POA: Diagnosis not present

## 2021-12-07 NOTE — Therapy (Signed)
OUTPATIENT PHYSICAL THERAPY TREATMENT   Patient Name: Maria [name] MRN: [id number] DOB:[birth year], 61 y.o., female Today's Date: 12/07/2021   PCP: Allyn Kenner REFERRING PROVIDER: Alric Ran   PT End of Session - 12/07/21 1521     Visit Number 5    Number of Visits 10    Date for PT Re-Evaluation 12/11/21    Authorization Type [employer] for [employer] (follows MCR guidelines)    Progress Note Due on Visit 10    PT Start Time 1519    PT Stop Time 1558    PT Time Calculation (min) 39 min    Behavior During Therapy Flat affect               Past Medical History:  Diagnosis Date   Constipation    Dysphagia    Fracture    R foot   GERD (gastroesophageal reflux disease)    History of shingles 10/2016   Mental retardation    lesion in head   Seizures ([place])    "not fully controlled on max doses of meds" (Neuro ofc note 12/2014)   Thrombocytopenia ([place])    Tremor    Past Surgical History:  Procedure Laterality Date   BIOPSY  09/16/2014   Procedure: BIOPSY;  Surgeon: Rogene Houston, MD;  Location: [facility];  Service: Endoscopy;;   CATARACT EXTRACTION     both eyes, May of 2015   COLONOSCOPY     COLONOSCOPY WITH PROPOFOL N/A 11/20/2018   Procedure: COLONOSCOPY WITH PROPOFOL;  Surgeon: Rogene Houston, MD;  Location: [facility];  Service: Endoscopy;  Laterality: N/A;   ESOPHAGEAL DILATION N/A 09/16/2014   Procedure: ESOPHAGEAL DILATION WITH 54FR MALONEY DILATOR;  Surgeon: Rogene Houston, MD;  Location: [facility];  Service: Endoscopy;  Laterality: N/A;   ESOPHAGOGASTRODUODENOSCOPY (EGD) WITH PROPOFOL N/A 09/16/2014   Procedure: ESOPHAGOGASTRODUODENOSCOPY (EGD) WITH PROPOFOL;  Surgeon: Rogene Houston, MD;  Location: [facility];  Service: Endoscopy;  Laterality: N/A;   LAPAROSCOPIC APPENDECTOMY N/A 07/14/2020   Procedure: APPENDECTOMY LAPAROSCOPIC;  Surgeon: Virl Cagey, MD;  Location: [facility];  Service: General;  Laterality: N/A;   MOUTH SURGERY     POLYPECTOMY  11/20/2018    Procedure: POLYPECTOMY;  Surgeon: Rogene Houston, MD;  Location: [facility];  Service: Endoscopy;;  colon   Skin graft to gum Right 08/2013   TONSILLECTOMY AND ADENOIDECTOMY     TOTAL ABDOMINAL HYSTERECTOMY     Patient Active Problem List   Diagnosis Date Noted   Pain in right shoulder 08/24/2021   Clavicle pain 08/15/2021   Acute urinary tract infection 03/13/2021   Dysuria 03/13/2021   COVID-19 02/19/2021   Allergic rhinitis 10/24/2020   Anemia 10/24/2020   Anxiety 10/24/2020   Heart failure ([place]) 10/24/2020   History of sepsis 10/24/2020   Mixed hyperlipidemia 10/24/2020   Neck pain 10/24/2020   Osteopenia 10/24/2020   Overactive bladder 10/24/2020   Polyp of colon 10/24/2020   ESBL (extended spectrum beta-lactamase) producing bacteria infection 05/12/2020   Bacteremia due to Escherichia coli 05/12/2020   Severe sepsis ([place]) 05/12/2020   Debility 05/05/2020   Pressure injury of skin 04/24/2020   Palliative care by specialist    Pyelonephritis of left kidney 04/14/2020   Renal abscess, left 04/14/2020   Leukocytosis 04/14/2020   Fever 04/14/2020   LLQ abdominal pain 04/14/2020   AKI (acute kidney injury) ([place])    Sepsis secondary to UTI ([place])    Pain in left ankle and  joints of left foot 12/02/2019   History of epilepsy 08/04/2019   Positive colorectal cancer screening using Cologuard test 10/28/2018   Posterior tibial tendinitis, right leg 04/23/2016   Pain in right foot 04/12/2016   Acute encephalopathy    Myoclonus    Frequent falls 11/09/2015   Fall 11/09/2015   Chest pain 03/16/2014   Upper abdominal pain 03/16/2014   Seizure disorder ([place]) 03/16/2014   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   Generalized convulsive epilepsy ([place]) 10/06/2012   DNR (do not resuscitate) discussion 10/06/2012   Intellectual disability    Thrombocytopenia ([place]) 05/23/2011   Seizures ([place]) 03/21/2011   GERD (gastroesophageal reflux disease) 03/21/2011   CLOSED FRACTURE  OF ACROMIAL END OF CLAVICLE 07/13/2008    ONSET DATE: 07/2021  REFERRING DIAG: G99.XXXD (ICD-10-CM) - Fall, subsequent encounter R26.9 (ICD-10-CM) - Gait abnormality   THERAPY DIAG:  Other abnormalities of gait and mobility  Muscle weakness (generalized)  Debility  Rationale for Evaluation and Treatment Rehabilitation  SUBJECTIVE:                                                                                                                                                                                              SUBJECTIVE STATEMENT: Pt states she has a bike at home she rides.  Reports she does her exercises more than once daily but unable to give me a precise number. No pain or issues today.  FROM IE: Dx with epilepsy since 61 years old, pt has a life long history of seizures. Pt spends a lot of time sleeping because of medication. Patient recently followed up with neurologist who recommended PT. Pt has had several falls, not due to seizure. Patient now presents for therapy at MDs referral.  Pt accompanied by: family member  PERTINENT HISTORY:  epilepsy since 61 years old  PAIN:  Are you having pain? No  PRECAUTIONS: None  WEIGHT BEARING RESTRICTIONS No  FALLS: Has patient fallen in last 6 months? Yes. Number of falls 3. February/march of 2023  LIVING ENVIRONMENT: Lives with: lives with their family Lives in: House/apartment Stairs: Yes: Internal: 71fs  steps; yes HR  Has following equipment at home: Single point cane, Walker - 2 wheeled, Shower bench, and Grab bars  PLOF:  needed help with cooking, medication, laundry. Pt  is independent in walking in home, is without caretaker at times. Able to do a lot for herself.   PATIENT GOALS  improve walking, improve balance    OBJECTIVE:   DIAGNOSTIC FINDINGS:  Impression: This is an abnormal EEG recording in the waking and drowsy state due to presence  of frequent spike and slow wave discharges which is consistent with a  generalized epileptogenic potential as seen in patient with generalized epilepsy and diffuse slowing which is consistent with a generalized brain dysfunction such as in encephalopathy In the ER 10/10/20, reporting a few falls over the several days, because her legs gave out. MRI of the brain and head CT was negative for acute findings.  CT lumbar spine was negative, did show spondylosis of the lumbar spine, x-ray of elbow and knee were negative.  She refused a left hip x-ray. CBC, CMP were unremarkable, glucose was 102, BUN 21, creatinine 0.83.    COGNITION: Overall cognitive status: Impaired: increased time for processing    SENSATION: WFL  COORDINATION: Impaired LE.    POSTURE: rounded shoulders and forward head    LOWER EXTREMITY MMT:    MMT Right Eval Left Eval  Hip flexion 5 5  Hip extension 4 single leg bridge  4  Hip abduction    Hip adduction 2 2  Hip internal rotation    Hip external rotation    Knee flexion 3- 3-  Knee extension 5 5  Ankle dorsiflexion 5 5  Ankle plantarflexion    Ankle inversion 5 5  Ankle eversion 5 5  (Blank rows = not tested) Patient with difficulty following commands to hold or move in certain manner during MMT  BED MOBILITY:  independent  BERG  1. SITTING TO STANDING  4 able to stand without using hands and stabilize independently  2. STANDING UNSUPPORTED  4 able to stand safely for 2 minutes  If a subject is able to stand 2 minutes unsupported, score full points for sitting unsupported. Proceed to item #4  3. SITTING WITH BACK UNSUPPORTED BUT FEET SUPPORTED ON FLOOR OR ON A STOOL 4 able to sit safely and securely for 2 minutes  4. STANDING TO SITTING 4 sits safely with minimal use of hands  5. TRANSFERS 4 able to transfer safely with minor use of hands  6. STANDING UNSUPPORTED WITH EYES CLOSED 4 able to stand 10 seconds safely   7. STANDING UNSUPPORTED WITH FEET TOGETHER 1 needs help to attain position but able to stand  15 seconds feet together  8. REACHING FORWARD WITH OUTSTRETCHED ARM WHILE STANDING 2 can reach forward 5 cm (2 inches  9. PICK UP OBJECT FROM THE FLOOR FROM A STANDING POSITION 0 unable to try/needs assist to keep from losing balance or falling Pt will not attempt is fearful that she will fall if trying.   10. TURNING TO LOOK BEHIND OVER LEFT AND RIGHT SHOULDERS WHILE STANDING  2 turns sideways only but maintains balance  11. TURN 360 DEGREES 2 able to turn 360 degrees safely but slowly  12. PLACE ALTERNATE FOOT ON STEP OR STOOL WHILE STANDING UNSUPPORTED 3 able to stand independently and complete 8 steps in > 20 seconds   13. STANDING UNSUPPORTED ONE FOOT IN FRONT 2 able to take small step independently and hold 30 seconds  14. STANDING ON ONE LEG 3 able to lift leg independently and hold 5-10 seconds   TOTAL SCORE  39/56 Score of < 45 indicates individuals may be at greater risk of falling Merrilee Jansky, 1992)           TRANSFERS:  Sit to stand: Complete Independence Stand to sit: Complete Independence Chair to chair: Complete Independence    GAIT: Gait pattern:  decreased cadence, step through pattern, decreased arm swing- Right, decreased arm swing- Left, decreased step  length- Right, decreased step length- Left, and decreased stride length Distance walked: 282'  Assistive device utilized: None Level of assistance: Complete Independence   FUNCTIONAL TESTs:  5 times sit to stand: 14.09 seconds multi attempts to rise  2 minute walk test: 282'  Berg Balance Scale: 39/56    TODAY'S TREATMENT:  12/07/2021 Standing:  Heelraises on slant 15X  Toeraises on slant 15X  SLS 5 trials 4 second max  Tandem stance 30" X3 each (able to maintain full 30")  Theraband Rows, extensions RTB 2X10  Theraband paloff each way RTB 2X10  Toe tapping 6" alternating LE with 1 HHA  Lateral step ups 6" 10X each with 1 HHA  Forward step ups 6"  10X each with 1 HHA Seated:  sit to stands  10X2 with eccentric lowering no UE assist   12/05/21 Standing:  heelraises 15X  Tandem stance 5 trials max of 15-20 seconds without UE assist  SLS 5 trials each max of 5-8 seconds without UE assist  RTB rows 2X10  RTB extensions 2X10  Toe tapping 6" alternating LE with 1 HHA  Lateral step ups 6" 10X each with 1 HHA  Forward step ups 6"  10X each with 1 HHA  Side stepping in // bars 5RT  [place] in // bars 3RT Seated: sit to stands 10X 2 eccentric lowering no UE assist   11/16/21: Seated posture education  RTB row 2x 10  STS eccentric control 10x Toe tapping 6in step height alternating with 1 UE support Tandem stance 2x 30 Sidestep with HHA inside // bars [place] 3RT inside //bars SLS with 1 HHA 2x 30"    11/12/21 - Tandem Stance with Support  - 1 x daily - 7 x weekly - 1 sets - 10 reps - 10 hold - Tandem Walking with Counter Support  - 1 x daily - 7 x weekly - 3 sets - 10 reps - [place] with Unilateral Counter Support  - 1 x daily - 7 x weekly - 3 sets - 10 reps - Single Leg Stance with Support  - 1 x daily - 7 x weekly - 1 sets - 10 reps - 10 hold - Heel Raises with Counter Support  - 1 x daily - 7 x weekly - 3 sets - 10 reps - Standing [place] with Counter Support  - 1 x daily - 7 x weekly - 3 sets - 10 reps - 2 hold - Sit to Stand Without Arm Support  - 1 x daily - 7 x weekly - 2 sets - 10 reps - Supine Bridge with Mini Swiss Ball Between Knees  - 1 x daily - 7 x weekly - 2 sets - 10 reps - 3 hold  Evaluation    PATIENT EDUCATION:  Education details: Patient ans family educated on exam findings, POC, scope of PT Person educated: Patient and family  Education method: Explanation, Demonstration, and Handouts Education comprehension: verbalized understanding, returned demonstration, verbal cues required, and tactile cues required   HOME EXERCISE PROGRAM: Access Code: [email or web address] URL: [email or web address] Date: 11/12/2021 Prepared by: Leota Jacobsen  Exercises - Tandem Stance with Support  - 1 x daily - 7 x weekly - 1 sets - 10 reps - 10 hold - Tandem Walking with Counter Support  - 1 x daily - 7 x weekly - 3 sets - 10 reps - [place] with Unilateral Counter Support  - 1 x daily - 7 x weekly - 3 sets - 10 reps - Single  Leg Stance with Support  - 1 x daily - 7 x weekly - 1 sets - 10 reps - 10 hold - Heel Raises with Counter Support  - 1 x daily - 7 x weekly - 3 sets - 10 reps - Standing March with Counter Support  - 1 x daily - 7 x weekly - 3 sets - 10 reps - 2 hold - Sit to Stand Without Arm Support  - 1 x daily - 7 x weekly - 2 sets - 10 reps - Supine Bridge with Mini Swiss Ball Between Knees  - 1 x daily - 7 x weekly - 2 sets - 10 reps - 3 hold    GOALS: Goals reviewed with patient? Yes  SHORT TERM GOALS: Target date: 12/21/2021  Patient will be independent with HEP in order to improve functional outcomes. Baseline:  Goal status: IN PROGRESS  2.  Patient will improve five time sit to stand by 5 seconds to improve efficiency and safety with tranfers.  Baseline: 14.09 Goal status: IN PROGRESS   LONG TERM GOALS: Target date: 01/11/2022  1.  Patient will improve 2MWT  by 100'   to improve safety, efficiency and ability to navigate home and community.  Baseline: 282' Goal status: IN PROGRESS  2.  Patient will improve adductor  strength by 1/2 grade to work towards improved performance of transfers, stairs and ambulation.  Baseline: 2 Goal status: IN PROGRESS  3. Patient will score at least 5 point on BERG  in order to demonstrate improved balance to reduce the risk for falls at home and in the community.  Baseline: 39/56 Goal status: IN PROGRESS  ASSESSMENT:  CLINICAL IMPRESSION: Continued focus on balance and LE strength.  Improved ability to maintain upright posturing and less instabilities today, however still required frequent cues to correct forward head. Pt mostly requires demonstration and tactile cues as has  difficulty placing UE/LE correctly.  Added paloff with difficulty clasping with both hands as wanting to let one go during activity. Pt with impulsive tendencies and cues to complete challenges slowly, controlled. Pt required 2 short seated rest breaks.  Pt will continue to benefit from skilled physical therapy.  From IE Patient a 61 y.o. y.o. female who was seen today for physical therapy evaluation and treatment for gait and balance impairment.  Pt with history of epilepsy since childhood. Pt requires increased time for processing and direction. Patients answers to questions do not always correlate to the question asked. Pt presents today after having an increase in falls in the home. Pt presents with weakness in hamstrings and adductors though pt understanding of direction is in question. Pt with increased difficulty with narrow base balance and coordination into these positions. Patient is somewhat self limiting in these tasks secondary to decreased confidence and fear of falling. Patient ambulates with shorter stride, decreased arm swing, decreased cadence. Pt with reports of low back pain that mother reports is fairly frequent. Patient will benefit from skilled PT services to improve strength, balance and functional mobility. Current plan is for 5 weeks and then re-evaluate what benefits patient is getting from PT.     OBJECTIVE IMPAIRMENTS Abnormal gait, decreased balance, decreased cognition, decreased coordination, decreased mobility, decreased strength, and pain.   ACTIVITY LIMITATIONS standing, squatting, and balance  PARTICIPATION LIMITATIONS: cleaning, shopping, community activity, and yard work  PERSONAL FACTORS 3+ comorbidities: anxiety, leukocytosis, osteopenia, epilepsy  are also affecting patient's functional outcome.   REHAB POTENTIAL: Fair direction following   CLINICAL  DECISION MAKING: Evolving/moderate complexity  EVALUATION COMPLEXITY: Moderate  PLAN: PT FREQUENCY:  2x/week  PT DURATION: other: 5 weeks and then reassess benefits from PT.   PLANNED INTERVENTIONS: Therapeutic exercises, Therapeutic activity, Neuromuscular re-education, Balance training, Gait training, Patient/Family education, Joint manipulation, Joint mobilization, Stair training, Orthotic/Fit training, DME instructions, Aquatic Therapy, Dry Needling, Electrical stimulation, Spinal manipulation, Spinal mobilization, Cryotherapy, Moist heat, Compression bandaging, scar mobilization, Splintting, Taping, Traction, Ultrasound, Ionotophoresis '4mg'$ /ml Dexamethasone, and Manual therapy   PLAN FOR NEXT SESSION: continue with gait and balance exercises   Teena Irani, PTA/CLT [place] Ph: [phone]  Teena Irani, PTA 12/07/2021, 3:22 PM

## 2021-12-10 ENCOUNTER — Ambulatory Visit (HOSPITAL_COMMUNITY): Payer: Medicare Other

## 2021-12-14 ENCOUNTER — Ambulatory Visit (HOSPITAL_COMMUNITY): Payer: Medicare Other

## 2021-12-14 ENCOUNTER — Encounter (HOSPITAL_COMMUNITY): Payer: Self-pay

## 2021-12-14 DIAGNOSIS — R2689 Other abnormalities of gait and mobility: Secondary | ICD-10-CM

## 2021-12-14 DIAGNOSIS — M6281 Muscle weakness (generalized): Secondary | ICD-10-CM

## 2021-12-14 DIAGNOSIS — R569 Unspecified convulsions: Secondary | ICD-10-CM | POA: Diagnosis not present

## 2021-12-14 DIAGNOSIS — R5381 Other malaise: Secondary | ICD-10-CM | POA: Diagnosis not present

## 2021-12-14 NOTE — Therapy (Signed)
OUTPATIENT PHYSICAL THERAPY TREATMENT/Discharge   Patient Name: Maria [name] MRN: [id number] DOB:Jul 23, 1960, 61 y.o., female Today's Date: 12/14/2021  PHYSICAL THERAPY DISCHARGE SUMMARY  Visits from Start of Care: 6  Current functional level related to goals / functional outcomes: Independent ambulation, transfers and stairs   Remaining deficits: Dec cadence, impaired narrow base balance   Education / Equipment: HEP   Patient agrees to discharge. Patient goals were partially met. Patient is being discharged due to lack of progress.    PCP: Allyn Kenner REFERRING PROVIDER: Alric Ran   PT End of Session - 12/14/21 1347     Visit Number 6    Number of Visits 10    Date for PT Re-Evaluation 12/11/21    Authorization Type [employer] for life (follows MCR guidelines)    Progress Note Due on Visit 10    PT Start Time 1347    PT Stop Time 1430    PT Time Calculation (min) 43 min    Behavior During Therapy Flat affect               Past Medical History:  Diagnosis Date   Constipation    Dysphagia    Fracture    R foot   GERD (gastroesophageal reflux disease)    History of shingles 10/2016   Mental retardation    lesion in head   Seizures ([place])    "not fully controlled on max doses of meds" (Neuro ofc note 12/2014)   Thrombocytopenia ([place])    Tremor    Past Surgical History:  Procedure Laterality Date   BIOPSY  09/16/2014   Procedure: BIOPSY;  Surgeon: Rogene Houston, MD;  Location: [facility];  Service: Endoscopy;;   CATARACT EXTRACTION     both eyes, May of 2015   COLONOSCOPY     COLONOSCOPY WITH PROPOFOL N/A 11/20/2018   Procedure: COLONOSCOPY WITH PROPOFOL;  Surgeon: Rogene Houston, MD;  Location: [facility];  Service: Endoscopy;  Laterality: N/A;   ESOPHAGEAL DILATION N/A 09/16/2014   Procedure: ESOPHAGEAL DILATION WITH 54FR MALONEY DILATOR;  Surgeon: Rogene Houston, MD;  Location: [facility];  Service: Endoscopy;  Laterality: N/A;    ESOPHAGOGASTRODUODENOSCOPY (EGD) WITH PROPOFOL N/A 09/16/2014   Procedure: ESOPHAGOGASTRODUODENOSCOPY (EGD) WITH PROPOFOL;  Surgeon: Rogene Houston, MD;  Location: [facility];  Service: Endoscopy;  Laterality: N/A;   LAPAROSCOPIC APPENDECTOMY N/A 07/14/2020   Procedure: APPENDECTOMY LAPAROSCOPIC;  Surgeon: Virl Cagey, MD;  Location: [facility];  Service: General;  Laterality: N/A;   MOUTH SURGERY     POLYPECTOMY  11/20/2018   Procedure: POLYPECTOMY;  Surgeon: Rogene Houston, MD;  [facility];  Service: Endoscopy;;  colon   Skin graft to gum Right 08/2013   TONSILLECTOMY AND ADENOIDECTOMY     TOTAL ABDOMINAL HYSTERECTOMY     Patient Active Problem List   Diagnosis Date Noted   Pain in right shoulder 08/24/2021   Clavicle pain 08/15/2021   Acute urinary tract infection 03/13/2021   Dysuria 03/13/2021   COVID-19 02/19/2021   Allergic rhinitis 10/24/2020   Anemia 10/24/2020   Anxiety 10/24/2020   Heart failure ([place]) 10/24/2020   History of sepsis 10/24/2020   Mixed hyperlipidemia 10/24/2020   Neck pain 10/24/2020   Osteopenia 10/24/2020   Overactive bladder 10/24/2020   Polyp of colon 10/24/2020   ESBL (extended spectrum beta-lactamase) producing bacteria infection 05/12/2020   Bacteremia due to Escherichia coli 05/12/2020   Severe sepsis ([place]) 05/12/2020   Debility 05/05/2020  Pressure injury of skin 04/24/2020   Palliative care by specialist    Pyelonephritis of left kidney 04/14/2020   Renal abscess, left 04/14/2020   Leukocytosis 04/14/2020   Fever 04/14/2020   LLQ abdominal pain 04/14/2020   AKI (acute kidney injury) ([place])    Sepsis secondary to UTI ([place])    Pain in left ankle and joints of left foot 12/02/2019   History of epilepsy 08/04/2019   Positive colorectal cancer screening using Cologuard test 10/28/2018   Posterior tibial tendinitis, right leg 04/23/2016   Pain in right foot 04/12/2016   Acute encephalopathy    Myoclonus    Frequent falls  11/09/2015   Fall 11/09/2015   Chest pain 03/16/2014   Upper abdominal pain 03/16/2014   Seizure disorder ([place]) 03/16/2014   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   Generalized convulsive epilepsy ([place]) 10/06/2012   DNR (do not resuscitate) discussion 10/06/2012   Intellectual disability    Thrombocytopenia ([place]) 05/23/2011   Seizures ([place]) 03/21/2011   GERD (gastroesophageal reflux disease) 03/21/2011   CLOSED FRACTURE OF ACROMIAL END OF CLAVICLE 07/13/2008    ONSET DATE: 07/2021  REFERRING DIAG: P54.XXXD (ICD-10-CM) - Fall, subsequent encounter R26.9 (ICD-10-CM) - Gait abnormality   THERAPY DIAG:  Other abnormalities of gait and mobility  Muscle weakness (generalized)  Seizures ([facility])  Rationale for Evaluation and Treatment Rehabilitation  SUBJECTIVE:                                                                                                                                                                                              SUBJECTIVE STATEMENT: Pt reports she is not in any pain.pt reports that she feels no changes since having started PT.  Pt reports that her back is fine and still feels like it did last week. Pt unable to report if she has any further goals of therapy. Pts nephew dropped pt off today, mother not present. Mother telephoned and is in agreement from PT discharge and without any questions.    FROM IE: Dx with epilepsy since 61 years old, pt has a life long history of seizures. Pt spends a lot of time sleeping because of medication. Patient recently followed up with neurologist who recommended PT. Pt has had several falls, not due to seizure. Patient now presents for therapy at MDs referral.  Pt accompanied by: family member  PERTINENT HISTORY:  epilepsy since 61 years old  PAIN:  Are you having pain? No  PRECAUTIONS: None  WEIGHT BEARING RESTRICTIONS No  FALLS: Has patient fallen in last 6 months? Yes. Number of falls 3. February/march of  2023  LIVING ENVIRONMENT: Lives with: lives with their family Lives in: House/apartment Stairs: Yes: Internal: 18fs  steps; yes HR  Has following equipment at home: Single point cane, Walker - 2 wheeled, Shower bench, and Grab bars  PLOF:  needed help with cooking, medication, laundry. Pt  is independent in walking in home, is without caretaker at times. Able to do a lot for herself.   PATIENT GOALS  improve walking, improve balance    OBJECTIVE:   DIAGNOSTIC FINDINGS:  Impression: This is an abnormal EEG recording in the waking and drowsy state due to presence of frequent spike and slow wave discharges which is consistent with a generalized epileptogenic potential as seen in patient with generalized epilepsy and diffuse slowing which is consistent with a generalized brain dysfunction such as in encephalopathy In the ER 10/10/20, reporting a few falls over the several days, because her legs gave out. MRI of the brain and head CT was negative for acute findings.  CT lumbar spine was negative, did show spondylosis of the lumbar spine, x-ray of elbow and knee were negative.  She refused a left hip x-ray. CBC, CMP were unremarkable, glucose was 102, BUN 21, creatinine 0.83.    COGNITION: Overall cognitive status: Impaired: increased time for processing    SENSATION: WFL  COORDINATION: Impaired LE.    POSTURE: rounded shoulders and forward head    LOWER EXTREMITY MMT:    MMT Right Eval Left Eval Right  12/14/21 Left  12/14/21  Hip flexion _0 Hip extension 4 single leg bridge  4 Pt will not let therapist help give manual cues and cannot understand verbal cues to follow directions. Unable to get pt into testing positions.  Pt will not let therapist help give manual cues and cannot understand verbal cues to follow directions. Unable to get pt into testing positions.   Hip abduction   Pt reports moving pains with trying to roll. In pain in laying in crevice of mat, therapist  attempts to reposition patient but patient resistant and cant follow commands to get into position Pt reports moving pains with trying to roll. In pain in laying in crevice of mat, therapist attempts to reposition patient but patient resistant and cant follow commands to get into position  Hip adduction 2 2 Pt reports moving pains with trying to roll. In pain in laying in crevice of mat, therapist attempts to reposition patient but patient resistant and cant follow commands to get into position Pt reports moving pains with trying to roll. In pain in laying in crevice of mat, therapist attempts to reposition patient but patient resistant and cant follow commands to get into position  Hip internal rotation      Hip external rotation      Knee flexion 3- 3-    Knee extension 5 5    Ankle dorsiflexion 5 5    Ankle plantarflexion      Ankle inversion 5 5    Ankle eversion 5 5    (Blank rows = not tested) Patient with difficulty following commands to hold or move in certain manner during MMT  BED MOBILITY:  independent  BERG  1. SITTING TO STANDING  4 able to stand without using hands and stabilize independently  2. STANDING UNSUPPORTED  4 able to stand safely for 2 minutes  If a subject is able to stand 2 minutes unsupported, score full points for sitting unsupported. Proceed to item #4  3. SITTING WITH BACK UNSUPPORTED  BUT FEET SUPPORTED ON FLOOR OR ON A STOOL 4 able to sit safely and securely for 2 minutes  4. STANDING TO SITTING 4 sits safely with minimal use of hands  5. TRANSFERS 4 able to transfer safely with minor use of hands  6. STANDING UNSUPPORTED WITH EYES CLOSED 4 able to stand 10 seconds safely   7. STANDING UNSUPPORTED WITH FEET TOGETHER 1 needs help to attain position but able to stand 15 seconds feet together  8. REACHING FORWARD WITH OUTSTRETCHED ARM WHILE STANDING 2 can reach forward 5 cm (2 inches  9. PICK UP OBJECT FROM THE FLOOR FROM A STANDING POSITION 4  able to pick up slipper safely and easily Pt will not attempt is fearful that she will fall if trying.   10. TURNING TO LOOK BEHIND OVER LEFT AND RIGHT SHOULDERS WHILE STANDING  2 turns sideways only but maintains balance  11. TURN 360 DEGREES  0 needs assistance while turning refuses to turn  12. PLACE ALTERNATE FOOT ON STEP OR STOOL WHILE STANDING UNSUPPORTED 3 able to stand independently and complete 8 steps in > 20 seconds   13. STANDING UNSUPPORTED ONE FOOT IN FRONT 0 loses balance while stepping or standing does not want to attempt  14. STANDING ON ONE LEG 1 tries to lift leg unable to hold 3 seconds but remains standing independently  TOTAL SCORE  37/56 Score of < 45 indicates individuals may be at greater risk of falling Merrilee Jansky, 1992)           TRANSFERS:  Sit to stand: Complete Independence Stand to sit: Complete Independence Chair to chair: Complete Independence    GAIT: Gait pattern:  decreased cadence, step through pattern, decreased arm swing- Right, decreased arm swing- Left, decreased step length- Right, decreased step length- Left, and decreased stride length Distance walked: 282'  Assistive device utilized: None Level of assistance: Complete Independence   FUNCTIONAL TESTs:  5 times sit to stand: 14.09 seconds multi attempts to rise  2 minute walk test: 282'  Berg Balance Scale: 39/56   12/14/21 5 times sit to stand 16.04 sec 2 minute walk test 230' BERG balance 37/56  TODAY'S TREATMENT:  12/14/21 Reassessed MMT, BERG, 2 minute walk, FTSS Rows with green theraband x20 Lat pull downs x20 green theraband Narrow base on foam pad with UE flexion x20 On foam modified sls with other foot on bolster at cg/mina Attempted tandem walking Nustep level 4 x8 mins   12/07/2021 Standing:  Heelraises on slant 15X  Toeraises on slant 15X  SLS 5 trials 4 second max  Tandem stance 30" X3 each (able to maintain full 30")  Theraband Rows, extensions RTB  2X10  Theraband paloff each way RTB 2X10  Toe tapping 6" alternating LE with 1 HHA  Lateral step ups 6" 10X each with 1 HHA  Forward step ups 6"  10X each with 1 HHA Seated:  sit to stands 10X2 with eccentric lowering no UE assist   12/05/21 Standing:  heelraises 15X  Tandem stance 5 trials max of 15-20 seconds without UE assist  SLS 5 trials each max of 5-8 seconds without UE assist  RTB rows 2X10  RTB extensions 2X10  Toe tapping 6" alternating LE with 1 HHA  Lateral step ups 6" 10X each with 1 HHA  Forward step ups 6"  10X each with 1 HHA  Side stepping in // bars 5RT  [place] in // bars 3RT Seated: sit to stands 10X 2 eccentric lowering  no UE assist   11/16/21: Seated posture education  RTB row 2x 10  STS eccentric control 10x Toe tapping 6in step height alternating with 1 UE support Tandem stance 2x 30 Sidestep with HHA inside // bars [place] 3RT inside //bars SLS with 1 HHA 2x 30"    11/12/21 - Tandem Stance with Support  - 1 x daily - 7 x weekly - 1 sets - 10 reps - 10 hold - Tandem Walking with Counter Support  - 1 x daily - 7 x weekly - 3 sets - 10 reps - [place] with Unilateral Counter Support  - 1 x daily - 7 x weekly - 3 sets - 10 reps - Single Leg Stance with Support  - 1 x daily - 7 x weekly - 1 sets - 10 reps - 10 hold - Heel Raises with Counter Support  - 1 x daily - 7 x weekly - 3 sets - 10 reps - Standing March with Counter Support  - 1 x daily - 7 x weekly - 3 sets - 10 reps - 2 hold - Sit to Stand Without Arm Support  - 1 x daily - 7 x weekly - 2 sets - 10 reps - Supine Bridge with Mini Swiss Ball Between Knees  - 1 x daily - 7 x weekly - 2 sets - 10 reps - 3 hold  Evaluation    PATIENT EDUCATION:  Education details: Patient ans family educated on exam findings, POC, scope of PT Person educated: Patient and family  Education method: Explanation, Demonstration, and Handouts Education comprehension: verbalized understanding, returned demonstration,  verbal cues required, and tactile cues required   HOME EXERCISE PROGRAM: Access Code: [email or web address] URL: [email or web address] Date: 11/12/2021 Prepared by: Leota Jacobsen  Exercises - Tandem Stance with Support  - 1 x daily - 7 x weekly - 1 sets - 10 reps - 10 hold - Tandem Walking with Counter Support  - 1 x daily - 7 x weekly - 3 sets - 10 reps - Carioca with Unilateral Counter Support  - 1 x daily - 7 x weekly - 3 sets - 10 reps - Single Leg Stance with Support  - 1 x daily - 7 x weekly - 1 sets - 10 reps - 10 hold - Heel Raises with Counter Support  - 1 x daily - 7 x weekly - 3 sets - 10 reps - Standing March with Counter Support  - 1 x daily - 7 x weekly - 3 sets - 10 reps - 2 hold - Sit to Stand Without Arm Support  - 1 x daily - 7 x weekly - 2 sets - 10 reps - Supine Bridge with Mini Swiss Ball Between Knees  - 1 x daily - 7 x weekly - 2 sets - 10 reps - 3 hold    GOALS: Goals reviewed with patient? Yes  SHORT TERM GOALS: Target date: 12/28/2021  Patient will be independent with HEP in order to improve functional outcomes. Baseline: pt reports she is doing them Independently  Goal status: MET  2.  Patient will improve five time sit to stand by 5 seconds to improve efficiency and safety with tranfers.  Baseline: 14.09 Goal status: NOT MET   LONG TERM GOALS: Target date: 01/18/2022  1.  Patient will improve 2MWT  by 100'   to improve safety, efficiency and ability to navigate home and community.  Baseline: 282' Goal status:not met  2.  Patient will improve adductor  strength by  1/2 grade to work towards improved performance of transfers, stairs and ambulation.  Baseline: 2 Goal status: unable to formally assess  3. Patient will score at least 5 point on BERG  in order to demonstrate improved balance to reduce the risk for falls at home and in the community.  Baseline: 39/56 Goal status: NOT MET  ASSESSMENT:  CLINICAL IMPRESSION: Progress note completed  today. Patient with some issues following commands and not wanting to participate in balance activities that are in narrow base challenges. Pts performance on objective measures indicate that she was better on eval though I strongly believe that this is not the case and is more based on pts understanding and willingness to perform activities today.during therapy sessions pt completes strength and balance exercises but is unable to follow commands that would really be refining and challenging to improve the areas of balance and impairment that pt demonstrates. At a baseline pt can walk transfer and perform stairs independent. Because of the lack of understanding for refinded movements within session there is limited improvement long term at this time. At this time it is appropriate for discharge and continue with HEP and walking program at home to keep up with current fittness level.    From IE Patient a 61 y.o. y.o. female who was seen today for physical therapy evaluation and treatment for gait and balance impairment.  Pt with history of epilepsy since childhood. Pt requires increased time for processing and direction. Patients answers to questions do not always correlate to the question asked. Pt presents today after having an increase in falls in the home. Pt presents with weakness in hamstrings and adductors though pt understanding of direction is in question. Pt with increased difficulty with narrow base balance and coordination into these positions. Patient is somewhat self limiting in these tasks secondary to decreased confidence and fear of falling. Patient ambulates with shorter stride, decreased arm swing, decreased cadence. Pt with reports of low back pain that mother reports is fairly frequent. Patient will benefit from skilled PT services to improve strength, balance and functional mobility. Current plan is for 5 weeks and then re-evaluate what benefits patient is getting from PT.     OBJECTIVE  IMPAIRMENTS Abnormal gait, decreased balance, decreased cognition, decreased coordination, decreased mobility, decreased strength, and pain.   ACTIVITY LIMITATIONS standing, squatting, and balance  PARTICIPATION LIMITATIONS: cleaning, shopping, community activity, and yard work  PERSONAL FACTORS 3+ comorbidities: anxiety, leukocytosis, osteopenia, epilepsy  are also affecting patient's functional outcome.   REHAB POTENTIAL: Fair direction following   CLINICAL DECISION MAKING: Evolving/moderate complexity  EVALUATION COMPLEXITY: Moderate  PLAN: PT FREQUENCY: 2x/week  PT DURATION: other: 5 weeks and then reassess benefits from PT.   PLANNED INTERVENTIONS: Therapeutic exercises, Therapeutic activity, Neuromuscular re-education, Balance training, Gait training, Patient/Family education, Joint manipulation, Joint mobilization, Stair training, Orthotic/Fit training, DME instructions, Aquatic Therapy, Dry Needling, Electrical stimulation, Spinal manipulation, Spinal mobilization, Cryotherapy, Moist heat, Compression bandaging, scar mobilization, Splintting, Taping, Traction, Ultrasound, Ionotophoresis 74m/ml Dexamethasone, and Manual therapy   PLAN FOR NEXT SESSION: continue with gait and balance exercises     Chidiebere Wynn, PT 12/14/2021, 1:47 PM

## 2021-12-27 ENCOUNTER — Ambulatory Visit (INDEPENDENT_AMBULATORY_CARE_PROVIDER_SITE_OTHER): Payer: TRICARE For Life (TFL) | Admitting: Gastroenterology

## 2022-01-08 DIAGNOSIS — H21233 Degeneration of iris (pigmentary), bilateral: Secondary | ICD-10-CM | POA: Diagnosis not present

## 2022-01-08 DIAGNOSIS — Z961 Presence of intraocular lens: Secondary | ICD-10-CM | POA: Diagnosis not present

## 2022-02-11 ENCOUNTER — Other Ambulatory Visit: Payer: Self-pay | Admitting: Neurology

## 2022-02-11 DIAGNOSIS — R569 Unspecified convulsions: Secondary | ICD-10-CM

## 2022-02-11 MED ORDER — LACOSAMIDE 200 MG PO TABS: 200.0000 mg | ORAL_TABLET | Freq: Two times a day (BID) | ORAL | 1 refills | Status: DC

## 2022-02-11 NOTE — Telephone Encounter (Signed)
Mother has called to report pt is out of refills on her lacosamide (VIMPAT) 200 MG TABS tablet , mother asking this be faxed to [facility] for refills.

## 2022-02-11 NOTE — Telephone Encounter (Signed)
Verify Drug Registry For Lacosamide 200 Mg Tablet Last Filled: 11/13/2021 Quantity: 180 tablets for 90 days Last appointment: 09/04/2021 Next appointment: 04/23/2022

## 2022-02-21 ENCOUNTER — Telehealth: Payer: Self-pay | Admitting: Neurology

## 2022-02-21 NOTE — Telephone Encounter (Signed)
Patient's rx has been sent to [facility].

## 2022-02-21 NOTE — Telephone Encounter (Signed)
Pt's mother called stating she is needing a refill request for the pt's lacosamide (VIMPAT) 200 MG TABS tablet sent in to [facility]

## 2022-03-06 DIAGNOSIS — B078 Other viral warts: Secondary | ICD-10-CM | POA: Diagnosis not present

## 2022-03-06 DIAGNOSIS — Z1283 Encounter for screening for malignant neoplasm of skin: Secondary | ICD-10-CM | POA: Diagnosis not present

## 2022-03-06 DIAGNOSIS — L258 Unspecified contact dermatitis due to other agents: Secondary | ICD-10-CM | POA: Diagnosis not present

## 2022-03-06 DIAGNOSIS — D225 Melanocytic nevi of trunk: Secondary | ICD-10-CM | POA: Diagnosis not present

## 2022-03-12 DIAGNOSIS — E785 Hyperlipidemia, unspecified: Secondary | ICD-10-CM | POA: Diagnosis not present

## 2022-03-12 DIAGNOSIS — I509 Heart failure, unspecified: Secondary | ICD-10-CM | POA: Diagnosis not present

## 2022-03-25 DIAGNOSIS — L308 Other specified dermatitis: Secondary | ICD-10-CM | POA: Diagnosis not present

## 2022-03-26 DIAGNOSIS — Z0000 Encounter for general adult medical examination without abnormal findings: Secondary | ICD-10-CM | POA: Diagnosis not present

## 2022-03-26 DIAGNOSIS — Z23 Encounter for immunization: Secondary | ICD-10-CM | POA: Diagnosis not present

## 2022-03-26 DIAGNOSIS — G40909 Epilepsy, unspecified, not intractable, without status epilepticus: Secondary | ICD-10-CM | POA: Diagnosis not present

## 2022-03-26 DIAGNOSIS — F419 Anxiety disorder, unspecified: Secondary | ICD-10-CM | POA: Diagnosis not present

## 2022-03-26 DIAGNOSIS — M8580 Other specified disorders of bone density and structure, unspecified site: Secondary | ICD-10-CM | POA: Diagnosis not present

## 2022-03-26 DIAGNOSIS — D696 Thrombocytopenia, unspecified: Secondary | ICD-10-CM | POA: Diagnosis not present

## 2022-03-26 DIAGNOSIS — K219 Gastro-esophageal reflux disease without esophagitis: Secondary | ICD-10-CM | POA: Diagnosis not present

## 2022-03-26 DIAGNOSIS — N3281 Overactive bladder: Secondary | ICD-10-CM | POA: Diagnosis not present

## 2022-03-26 DIAGNOSIS — E785 Hyperlipidemia, unspecified: Secondary | ICD-10-CM | POA: Diagnosis not present

## 2022-03-26 DIAGNOSIS — R944 Abnormal results of kidney function studies: Secondary | ICD-10-CM | POA: Diagnosis not present

## 2022-04-09 ENCOUNTER — Other Ambulatory Visit: Payer: Self-pay

## 2022-04-09 ENCOUNTER — Other Ambulatory Visit: Payer: Self-pay | Admitting: Neurology

## 2022-04-09 MED ORDER — CLONAZEPAM 0.5 MG PO TABS
0.5000 mg | ORAL_TABLET | Freq: Two times a day (BID) | ORAL | 1 refills | Status: DC
Start: 2022-04-09 — End: 2022-09-23

## 2022-04-09 MED ORDER — CLONAZEPAM 0.5 MG PO TABS: 0.5000 mg | ORAL_TABLET | Freq: Two times a day (BID) | ORAL | 1 refills | Status: DC

## 2022-04-09 NOTE — Telephone Encounter (Signed)
[place] Drug registry checked. Pt last filled 12/25/21 180 tab for 3 mo supply. Pt last seen 09/04/21. Refill is appropriate.

## 2022-04-09 NOTE — Telephone Encounter (Signed)
Pt's mother, Maple Mirza request new prescription  for  clonazePAM (KLONOPIN) 0.5 MG tablet at  [facility]

## 2022-04-10 NOTE — Telephone Encounter (Signed)
I have faxed the printed copy for [facility]epam to [facility]. Confirmation received.

## 2022-04-15 ENCOUNTER — Encounter (INDEPENDENT_AMBULATORY_CARE_PROVIDER_SITE_OTHER): Payer: Self-pay | Admitting: Gastroenterology

## 2022-04-22 NOTE — Progress Notes (Unsigned)
Patient: Maria [name] Date of Birth: Nov 11, 1960  Reason for Visit: Follow up History from: Patient, nephew  Primary Neurologist: Camara   ASSESSMENT AND PLAN 61 y.o. year old female   1.  Seizures -No recent seizures reported -Continue current doses of Clonazepam, Depakote, Vimpat; refills appear current  -Check labs at next visit -Summary of most recent labs (11/14/21 platelet 139-this is baseline; 09/04/21 vitamin D 55.5, Vimpat 13.6; 08/28/21 Depakote 90) -Follow-up in 6 months or sooner if needed  HISTORY OF PRESENT ILLNESS: Today 04/23/22 Here today with her nephew. No seizures. Remains on Depakote, Klonopin, Vimpat. No falls. No changes to her health. Her mother manages her medications. Her mother is 60. EEG in April 2023 was abnormal due to the presence of frequent spike and slow wave discharges that is consistent with a generalized epileptogenic potential, also present was diffuse slowing.  No problems or concerns.  HISTORY  Dr.Camara 09/04/21: Patient present today for follow-up, she is accompanied by her mother.  Since last visit he denies any additional seizures.  She still on Depakote, Vimpat and clonazepam.  She reported she has been having increased falls, last fall was 2 weeks ago.  Patient denies any prodrome prior to fall, denies any weakness, no dizziness, no lightheadedness seizures falls.  Patient thinks she is not having seizures with these falls.  Per mother her seizures are described as generalized tonic-clonic seizures.   REVIEW OF SYSTEMS: Out of a complete 14 system review of symptoms, the patient complains only of the following symptoms, and all other reviewed systems are negative.  See HPI  ALLERGIES: Allergies  Allergen Reactions   Codeine Nausea And Vomiting   Lamictal [Lamotrigine] Rash    HOME MEDICATIONS: Outpatient Medications Prior to Visit  Medication Sig Dispense Refill   atorvastatin (LIPITOR) 20 MG tablet Take 20 mg by mouth daily.      Calcium Carb-Cholecalciferol (CALCIUM/VITAMIN D PO) Take 1 tablet by mouth 2 (two) times daily.     cholecalciferol (VITAMIN D) 25 MCG (1000 UNIT) tablet Take 1,000 Units by mouth daily.     clonazePAM (KLONOPIN) 0.5 MG tablet Take 1 tablet (0.5 mg total) by mouth 2 (two) times daily. 180 tablet 1   conjugated estrogens (PREMARIN) vaginal cream Place 1 Applicatorful vaginally daily. 42.5 g 12   divalproex (DEPAKOTE) 500 MG DR tablet TAKE 2 TABLETS (1,000 MG TOTAL) BY MOUTH DAILY. 180 tablet 3   ibuprofen (ADVIL) 200 MG tablet Take 200 mg by mouth every 6 (six) hours as needed.     lacosamide (VIMPAT) 200 MG TABS tablet Take 1 tablet (200 mg total) by mouth 2 (two) times daily. 180 tablet 1   omeprazole (PRILOSEC) 20 MG capsule TAKE 1 CAPSULE DAILY 90 capsule 3   oxybutynin (DITROPAN) 5 MG tablet TAKE 1 TABLET AT BEDTIME 90 tablet 3   Pediatric Multiple Vit-C-FA (PEDIATRIC MULTIVITAMIN) chewable tablet Chew 1 tablet by mouth daily.     VIMPAT 200 MG TABS tablet Take 1 tablet (200 mg total) by mouth 2 (two) times daily. 180 tablet 1   No facility-administered medications prior to visit.    PAST MEDICAL HISTORY: Past Medical History:  Diagnosis Date   Constipation    Dysphagia    Fracture    R foot   GERD (gastroesophageal reflux disease)    History of shingles 10/2016   Mental retardation    lesion in head   Seizures ([place])    "not fully controlled on max doses of meds" (  Neuro ofc note 12/2014)   Thrombocytopenia ([place])    Tremor     PAST SURGICAL HISTORY: Past Surgical History:  Procedure Laterality Date   BIOPSY  09/16/2014   Procedure: BIOPSY;  Surgeon: Rogene Houston, MD;  Location: [facility];  Service: Endoscopy;;   CATARACT EXTRACTION     both eyes, May of 2015   COLONOSCOPY     COLONOSCOPY WITH PROPOFOL N/A 11/20/2018   Procedure: COLONOSCOPY WITH PROPOFOL;  Surgeon: Rogene Houston, MD;  [facility];  Service: Endoscopy;  Laterality: N/A;   ESOPHAGEAL DILATION  N/A 09/16/2014   Procedure: ESOPHAGEAL DILATION WITH 54FR MALONEY DILATOR;  Surgeon: Rogene Houston, MD;  Location: [facility];  Service: Endoscopy;  Laterality: N/A;   ESOPHAGOGASTRODUODENOSCOPY (EGD) WITH PROPOFOL N/A 09/16/2014   Procedure: ESOPHAGOGASTRODUODENOSCOPY (EGD) WITH PROPOFOL;  Surgeon: Rogene Houston, MD;  Location: [facility];  Service: Endoscopy;  Laterality: N/A;   LAPAROSCOPIC APPENDECTOMY N/A 07/14/2020   Procedure: APPENDECTOMY LAPAROSCOPIC;  Surgeon: Virl Cagey, MD;  Location: [facility];  Service: General;  Laterality: N/A;   MOUTH SURGERY     POLYPECTOMY  11/20/2018   Procedure: POLYPECTOMY;  Surgeon: Rogene Houston, MD;  [facility];  Service: Endoscopy;;  colon   Skin graft to gum Right 08/2013   TONSILLECTOMY AND ADENOIDECTOMY     TOTAL ABDOMINAL HYSTERECTOMY      FAMILY HISTORY: Family History  Problem Relation Age of Onset   High Cholesterol Mother    High blood pressure Mother    Diabetes Father     SOCIAL HISTORY: Social History   Socioeconomic History   Marital status: Single    Spouse name: Not on file   Number of children: 0   Years of education: 10   Highest education level: Not on file  Occupational History    Employer: UNEMPLOYED  Tobacco Use   Smoking status: Former    Packs/day: 1.50    Years: 15.00    Total pack years: 22.50    Types: Cigarettes    Quit date: 05/22/2006    Years since quitting: 15.9   Smokeless tobacco: Never  Vaping Use   Vaping Use: Never used  Substance and Sexual Activity   Alcohol use: No    Alcohol/week: 0.0 standard drinks of alcohol   Drug use: No   Sexual activity: Never    Birth control/protection: Other-see comments    Comment: Hysterectomy  Other Topics Concern   Not on file  Social History Narrative   Patient is single and lives with her mother Romie Minus)      Patient drinks 3 cups of caffeine daily.   Patient is right handed.         Social Determinants of Health   Financial  Resource Strain: Not on file  Food Insecurity: Not on file  Transportation Needs: Not on file  Physical Activity: Not on file  Stress: Not on file  Social Connections: Not on file  Intimate Partner Violence: Not on file    PHYSICAL EXAM  Vitals:   04/23/22 1433  BP: 127/83  Pulse: 71  Weight: 157 lb (71.2 kg)  Height: '5\' 6"'$  (1.676 m)   Body mass index is 25.34 kg/m.  Generalized: Well developed, in no acute distress  Neurological examination  Mentation: Alert, is limited historian, some cognitive impairment, follows most exam commands Cranial nerve II-XII: Pupils were equal round reactive to light. Extraocular movements were full, visual field were full on confrontational  test. Facial sensation and strength were normal. Head turning and shoulder shrug were normal and symmetric. Motor: The motor testing reveals 5 over 5 strength of all 4 extremities. Good symmetric motor tone is noted throughout.  Sensory: Sensory testing is intact to soft touch on all 4 extremities. No evidence of extinction is noted.  Coordination: Cerebellar testing reveals good finger-nose-finger and heel-to-shin bilaterally.  Mild tremor with finger-nose-finger. Gait and station: Gait cautious, looks down Reflexes: Deep tendon reflexes are symmetric and normal bilaterally.   DIAGNOSTIC DATA (LABS, IMAGING, TESTING) - I reviewed patient records, labs, notes, testing and imaging myself where available.  Lab Results  Component Value Date   WBC 5.8 11/14/2021   HGB 12.3 11/14/2021   HCT 36.6 11/14/2021   MCV 94.3 11/14/2021   PLT 139 (L) 11/14/2021      Component Value Date/Time   NA 140 11/14/2021 0148   NA 143 11/05/2016 1104   K 3.8 11/14/2021 0148   CL 104 11/14/2021 0148   CO2 25 11/14/2021 0148   GLUCOSE 158 (H) 11/14/2021 0148   BUN 20 11/14/2021 0148   BUN 25 (H) 11/05/2016 1104   CREATININE 0.89 11/14/2021 0148   CALCIUM 9.1 11/14/2021 0148   PROT 6.5 11/14/2021 0148   PROT 6.8  11/05/2016 1104   ALBUMIN 4.0 11/14/2021 0148   ALBUMIN 4.2 11/05/2016 1104   AST 16 11/14/2021 0148   ALT 22 11/14/2021 0148   ALKPHOS 62 11/14/2021 0148   BILITOT 0.4 11/14/2021 0148   BILITOT 0.3 11/05/2016 1104   GFRNONAA >60 11/14/2021 0148   GFRAA >60 06/11/2017 1542   Lab Results  Component Value Date   CHOL  10/13/2009    169        ATP III CLASSIFICATION:  <200     mg/dL   Desirable  200-239  mg/dL   Borderline High  >=240    mg/dL   High          HDL 47 10/13/2009   LDLCALC (H) 10/13/2009    108        Total Cholesterol/HDL:CHD Risk Coronary Heart Disease Risk Table                     Men   Women  1/2 Average Risk   3.4   3.3  Average Risk       5.0   4.4  2 X Average Risk   9.6   7.1  3 X Average Risk  23.4   11.0        Use the calculated Patient Ratio above and the CHD Risk Table to determine the patient's CHD Risk.        ATP III CLASSIFICATION (LDL):  <100     mg/dL   Optimal  100-129  mg/dL   Near or Above                    Optimal  130-159  mg/dL   Borderline  160-189  mg/dL   High  >190     mg/dL   Very High   TRIG 69 10/13/2009   CHOLHDL 3.6 10/13/2009   Lab Results  Component Value Date   HGBA1C 5.7 (H) 04/17/2020   Lab Results  Component Value Date   VITAMINB12 1,246 (H) 05/08/2020   Lab Results  Component Value Date   TSH 2.660 03/16/2014    Butler Denmark, AGNP-C, DNP 04/23/2022, 2:44 PM [facility] [address],  [place], [place] [address] [phone]

## 2022-04-23 ENCOUNTER — Ambulatory Visit (INDEPENDENT_AMBULATORY_CARE_PROVIDER_SITE_OTHER): Payer: Medicare Other | Admitting: Neurology

## 2022-04-23 ENCOUNTER — Encounter: Payer: Self-pay | Admitting: Neurology

## 2022-04-23 VITALS — BP 127/83 | HR 71 | Ht 66.0 in | Wt 157.0 lb

## 2022-04-23 DIAGNOSIS — G40909 Epilepsy, unspecified, not intractable, without status epilepticus: Secondary | ICD-10-CM | POA: Diagnosis not present

## 2022-04-23 NOTE — Patient Instructions (Signed)
Everything looks good, we will check labs at next visit Continue current medications Looks like refills are current  See you back in 6 months

## 2022-06-11 ENCOUNTER — Ambulatory Visit (INDEPENDENT_AMBULATORY_CARE_PROVIDER_SITE_OTHER): Payer: TRICARE For Life (TFL) | Admitting: Gastroenterology

## 2022-06-23 IMAGING — US US RENAL
1 series · 14 of 25 positions shown · non-contrast
Comparison: CT 06/07/2020

CLINICAL DATA: History pyelonephritis

EXAM:
RENAL / URINARY TRACT ULTRASOUND COMPLETE

[Series 1: us renal · 14 of 49 slices shown]
[im 1/49]
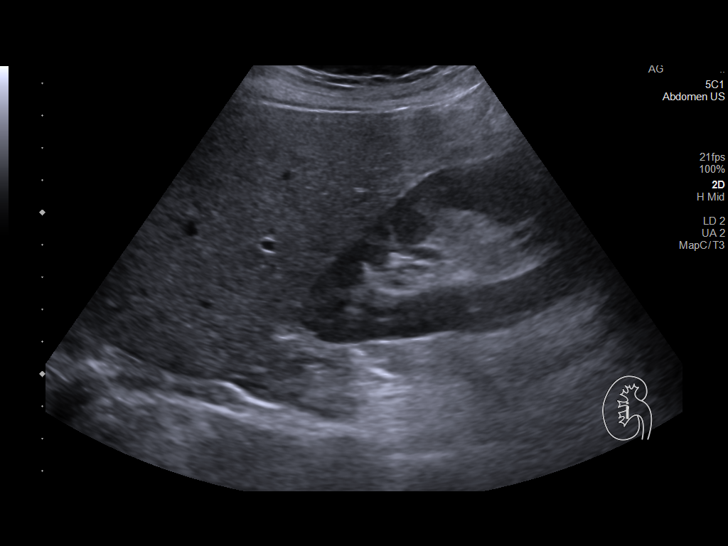
[im 5/49]
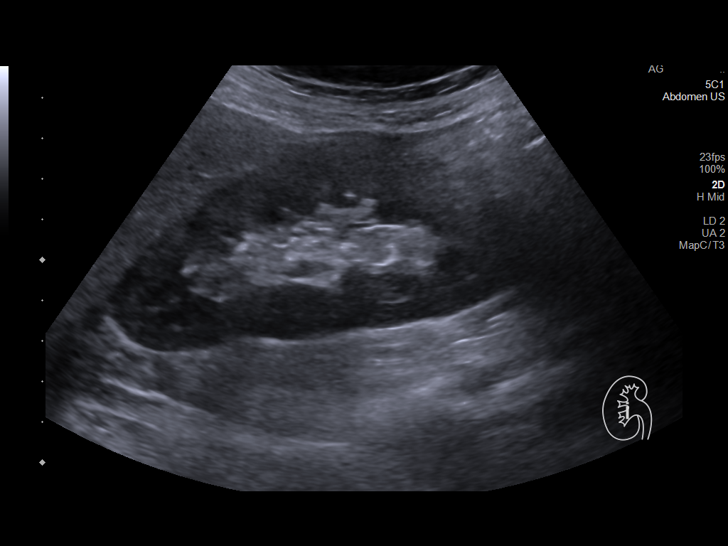
[im 9/49]
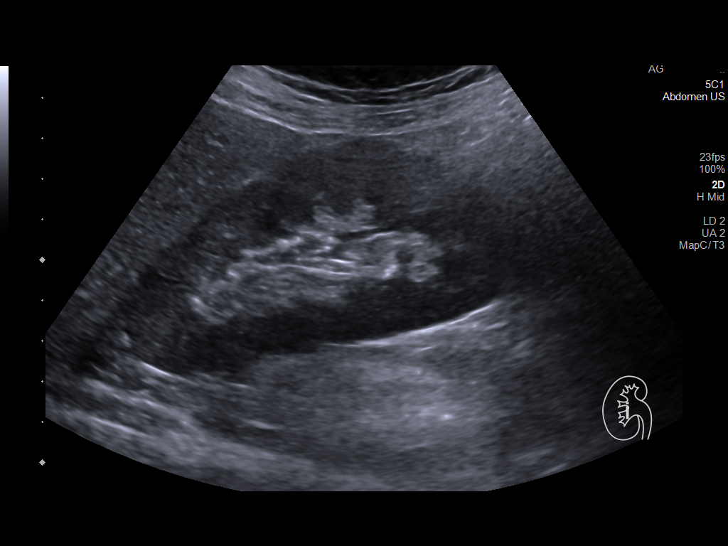
[im 13/49]
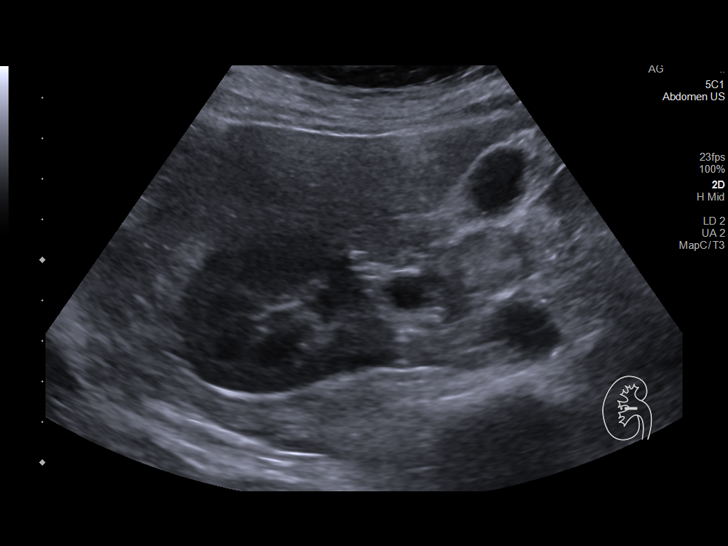
[im 17/49]
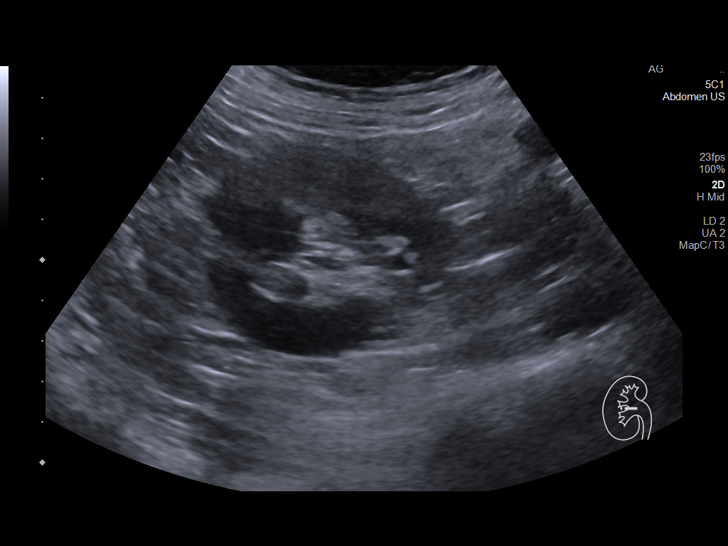
[im 19/49]
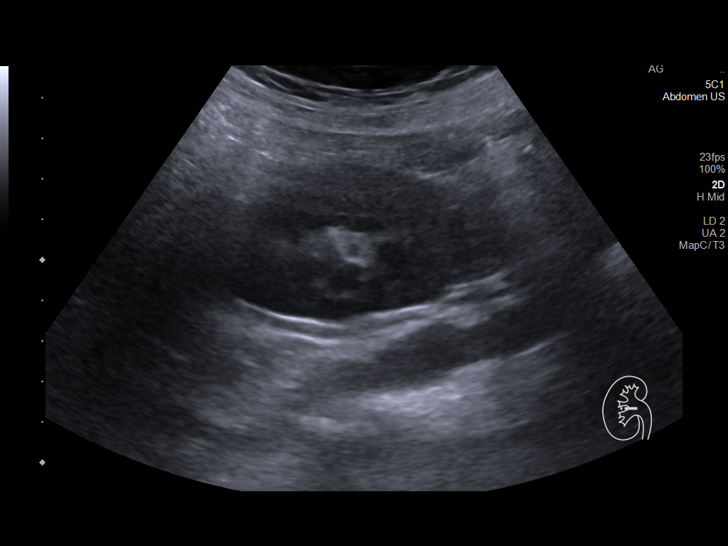
[im 23/49]
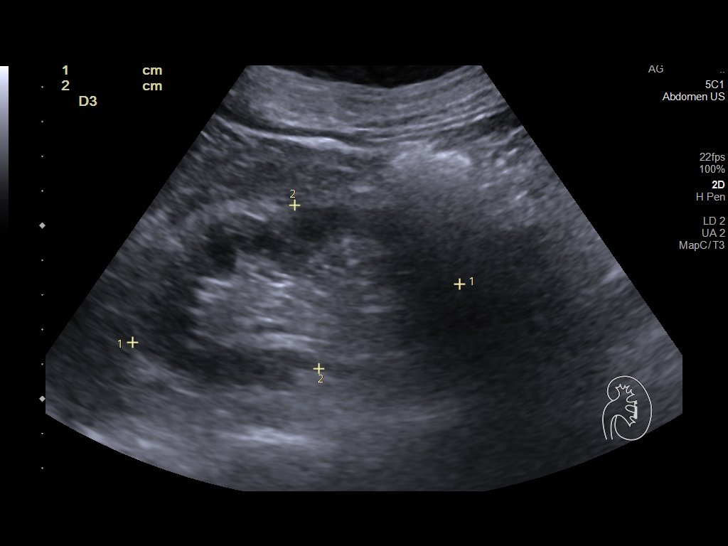
[im 27/49]
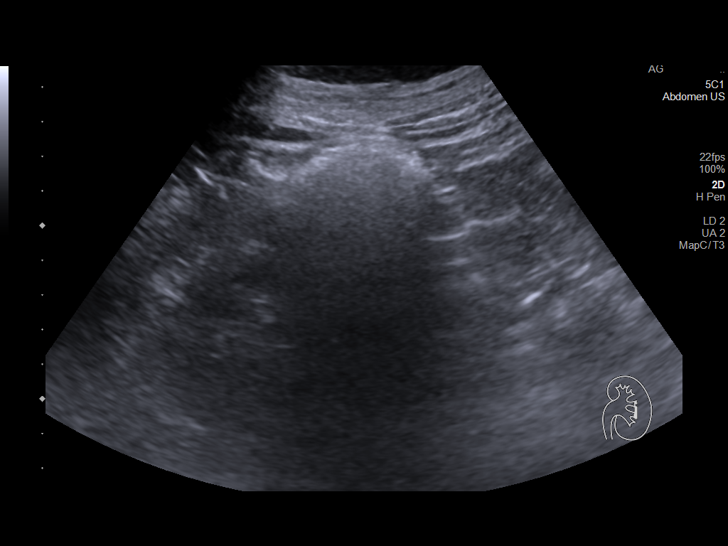
[im 31/49]
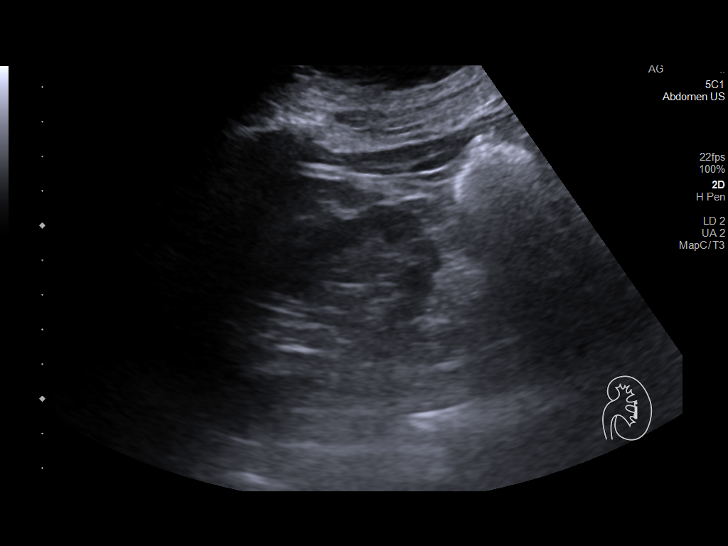
[im 33/49]
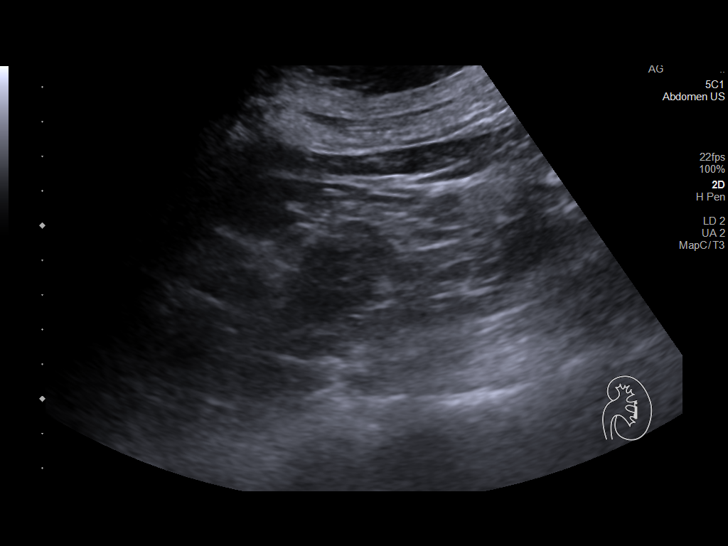
[im 37/49]
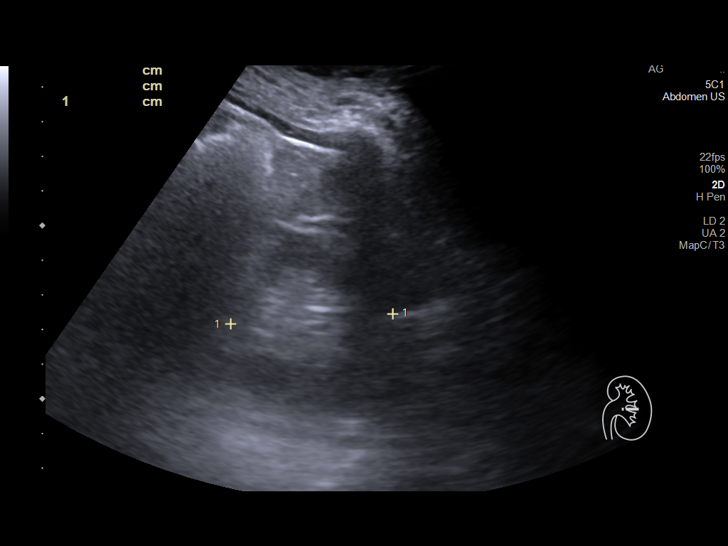
[im 41/49]
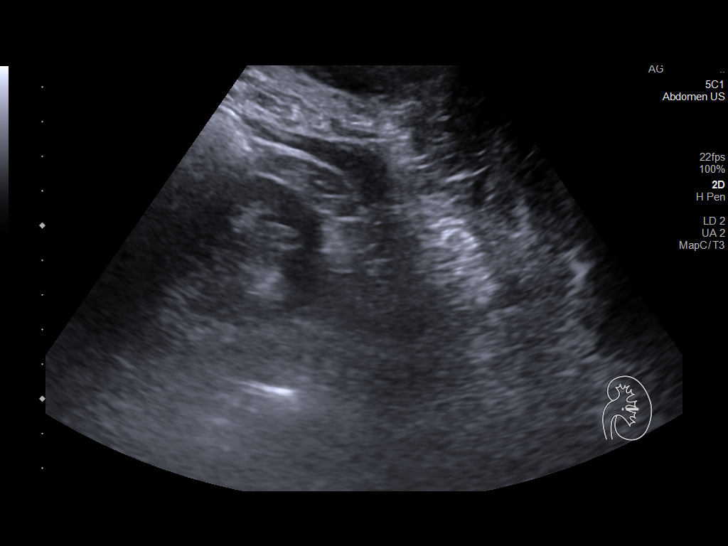
[im 45/49]
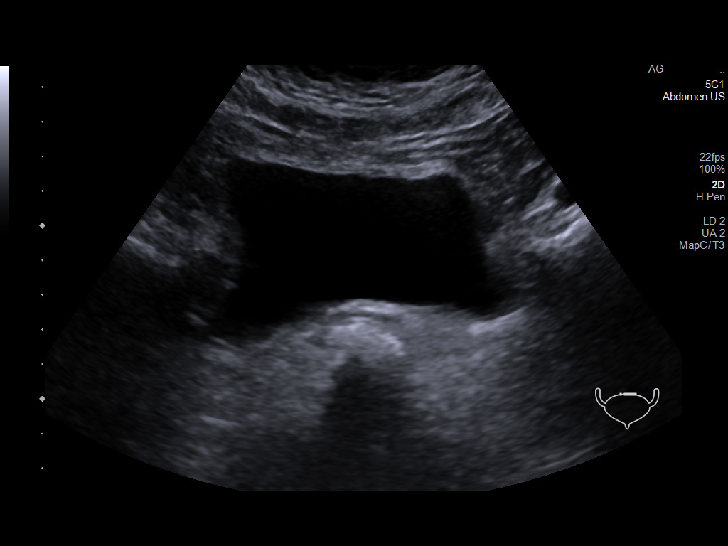
[im 49/49]
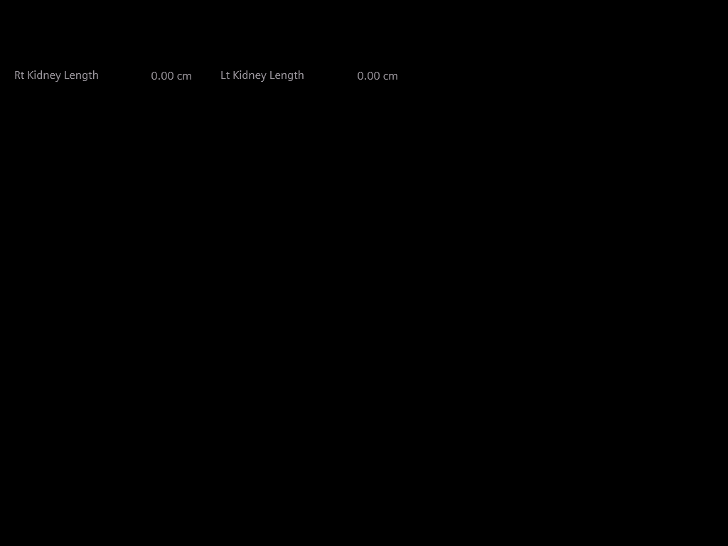

[14 of 25 positions shown; findings below may reference images not displayed]

FINDINGS: Right Kidney:

Renal measurements: 10.5 x 4.7 x 5.6 cm = volume: 145.1 mL.
Echogenicity within normal limits. No mass or hydronephrosis
visualized.

Left Kidney:

Renal measurements: 9.6 x 4.8 x 4.7 cm = volume: 112.3 mL.
Echogenicity within normal limits. No mass or hydronephrosis
visualized.

Bladder:

Appears normal for degree of bladder distention.

Other:

None.
IMPRESSION: Negative ultrasound appearance of the kidneys. No perinephric fluid
collections are identified today.

## 2022-06-24 DIAGNOSIS — Z1272 Encounter for screening for malignant neoplasm of vagina: Secondary | ICD-10-CM | POA: Diagnosis not present

## 2022-06-24 DIAGNOSIS — Z1151 Encounter for screening for human papillomavirus (HPV): Secondary | ICD-10-CM | POA: Diagnosis not present

## 2022-06-24 DIAGNOSIS — Z124 Encounter for screening for malignant neoplasm of cervix: Secondary | ICD-10-CM | POA: Diagnosis not present

## 2022-06-24 DIAGNOSIS — Z1231 Encounter for screening mammogram for malignant neoplasm of breast: Secondary | ICD-10-CM | POA: Diagnosis not present

## 2022-06-24 DIAGNOSIS — Z6825 Body mass index (BMI) 25.0-25.9, adult: Secondary | ICD-10-CM | POA: Diagnosis not present

## 2022-07-04 ENCOUNTER — Ambulatory Visit (INDEPENDENT_AMBULATORY_CARE_PROVIDER_SITE_OTHER): Payer: Medicare Other | Admitting: Gastroenterology

## 2022-07-04 ENCOUNTER — Encounter (INDEPENDENT_AMBULATORY_CARE_PROVIDER_SITE_OTHER): Payer: Self-pay | Admitting: *Deleted

## 2022-07-04 ENCOUNTER — Encounter (INDEPENDENT_AMBULATORY_CARE_PROVIDER_SITE_OTHER): Payer: Self-pay | Admitting: Gastroenterology

## 2022-07-04 DIAGNOSIS — K219 Gastro-esophageal reflux disease without esophagitis: Secondary | ICD-10-CM

## 2022-07-04 MED ORDER — OMEPRAZOLE 20 MG PO CPDR: 20.0000 mg | DELAYED_RELEASE_CAPSULE | Freq: Every day | ORAL | 3 refills | Status: DC

## 2022-07-04 NOTE — Progress Notes (Signed)
Maria [name], M.D. Gastroenterology & Hepatology [facility] [address] [place], [place] [address]  Primary Care Physician: Celene Squibb, MD [address] [id number]  I will communicate my assessment and recommendations to the referring MD via EMR.  Problems: GERD Constipation   History of Present Illness: Maria [name] is a 62 y.o. female with PMH GERD, cognitive impairment, seizures, who presents for follow up of GERD.  The patient was last seen on 11/06/2020. At that time, the patient was advised to decrease the omeprazole to 20 mg every day.  Comes to the office with her sister, who helps providing most of the information. States that she does not have heartburn or dysphagia, no odynophagia or changes in appetite. The patient denies having any nausea, vomiting, fever, chills, hematochezia, melena, hematemesis, abdominal distention, abdominal pain, diarrhea, jaundice, pruritus or weight loss.  Sister started her on stool softeners a few months ago and now she is having a BM every day.  Last EGD: 2016 - Ectopic islands of gastric tissue below upper esophageal sphincter. Noncritical Schatzki's ring and small sliding hiatal hernia. Small hyperplastic appearing polyps at gastric body. These were left alone. Erosive gastritis with small gastric ulcer at antrum. Esophagus dilated by passing 42 French Maloney dilator resulting in linear disruption at GE junction and also it proximal esophagus indicative of disrupted web.   Last Colonoscopy: 11/2018-for positive Cologuard test-12 mm polyp IC valve, 8 to 12 mm polyp mid transverse partially retrieved, external hemorrhoids. Repeat in 2023  Past Medical History: Past Medical History:  Diagnosis Date   Constipation    Dysphagia    Fracture    R foot   GERD (gastroesophageal reflux disease)    History of shingles 10/2016   Mental retardation    lesion in head   Seizures  ([place])    "not fully controlled on max doses of meds" (Neuro ofc note 12/2014)   Thrombocytopenia ([place])    Tremor     Past Surgical History: Past Surgical History:  Procedure Laterality Date   BIOPSY  09/16/2014   Procedure: BIOPSY;  Surgeon: Rogene Houston, MD;  [facility]: [facility];  Service: Endoscopy;;   CATARACT EXTRACTION     both eyes, May of 2015   COLONOSCOPY     COLONOSCOPY WITH PROPOFOL N/A 11/20/2018   Procedure: COLONOSCOPY WITH PROPOFOL;  Surgeon: Rogene Houston, MD;  [facility];  Service: Endoscopy;  Laterality: N/A;   ESOPHAGEAL DILATION N/A 09/16/2014   Procedure: ESOPHAGEAL DILATION WITH 54FR MALONEY DILATOR;  Surgeon: Rogene Houston, MD;  Location: [facility];  Service: Endoscopy;  Laterality: N/A;   ESOPHAGOGASTRODUODENOSCOPY (EGD) WITH PROPOFOL N/A 09/16/2014   Procedure: ESOPHAGOGASTRODUODENOSCOPY (EGD) WITH PROPOFOL;  Surgeon: Rogene Houston, MD;  [facility];  Service: Endoscopy;  Laterality: N/A;   LAPAROSCOPIC APPENDECTOMY N/A 07/14/2020   Procedure: APPENDECTOMY LAPAROSCOPIC;  Surgeon: Virl Cagey, MD;  Location: [facility];  Service: General;  Laterality: N/A;   MOUTH SURGERY     POLYPECTOMY  11/20/2018   Procedure: POLYPECTOMY;  Surgeon: Rogene Houston, MD;  [facility];  Service: Endoscopy;;  colon   Skin graft to gum Right 08/2013   TONSILLECTOMY AND ADENOIDECTOMY     TOTAL ABDOMINAL HYSTERECTOMY      Family History: Family History  Problem Relation Age of Onset   High Cholesterol Mother    High blood pressure Mother    Diabetes Father  Social History: Social History   Tobacco Use  Smoking Status Former   Packs/day: 1.50   Years: 15.00   Total pack years: 22.50   Types: Cigarettes   Quit date: 05/22/2006   Years since quitting: 16.1  Smokeless Tobacco Never   Social History   Substance and Sexual Activity  Alcohol Use No   Alcohol/week: 0.0 standard drinks of alcohol   Social History   Substance and  Sexual Activity  Drug Use No    Allergies: Allergies  Allergen Reactions   Codeine Nausea And Vomiting   Lamictal [Lamotrigine] Rash    Medications: Current Outpatient Medications  Medication Sig Dispense Refill   atorvastatin (LIPITOR) 20 MG tablet Take 20 mg by mouth daily.     Calcium Carb-Cholecalciferol (CALCIUM/VITAMIN D PO) Take 1 tablet by mouth 2 (two) times daily.     calcium carbonate (TUMS - DOSED IN MG ELEMENTAL CALCIUM) 500 MG chewable tablet Chew 1 tablet by mouth as needed for indigestion or heartburn.     clonazePAM (KLONOPIN) 0.5 MG tablet Take 1 tablet (0.5 mg total) by mouth 2 (two) times daily. 180 tablet 1   divalproex (DEPAKOTE) 500 MG DR tablet TAKE 2 TABLETS (1,000 MG TOTAL) BY MOUTH DAILY. 180 tablet 3   docusate sodium (COLACE) 100 MG capsule Take 200 mg by mouth at bedtime.     ibuprofen (ADVIL) 200 MG tablet Take 200 mg by mouth every 6 (six) hours as needed.     lacosamide (VIMPAT) 200 MG TABS tablet Take 1 tablet (200 mg total) by mouth 2 (two) times daily. 180 tablet 1   Multiple Vitamin (MULTIVITAMIN) capsule Take 1 capsule by mouth daily.     omeprazole (PRILOSEC) 20 MG capsule TAKE 1 CAPSULE DAILY 90 capsule 3   oxybutynin (DITROPAN) 5 MG tablet TAKE 1 TABLET AT BEDTIME 90 tablet 3   VIMPAT 200 MG TABS tablet Take 1 tablet (200 mg total) by mouth 2 (two) times daily. 180 tablet 1   No current facility-administered medications for this visit.    Review of Systems: GENERAL: negative for malaise, night sweats HEENT: No changes in hearing or vision, no nose bleeds or other nasal problems. NECK: Negative for lumps, goiter, pain and significant neck swelling RESPIRATORY: Negative for cough, wheezing CARDIOVASCULAR: Negative for chest pain, leg swelling, palpitations, orthopnea GI: SEE HPI MUSCULOSKELETAL: Negative for joint pain or swelling, back pain, and muscle pain. SKIN: Negative for lesions, rash PSYCH: Negative for sleep disturbance, mood  disorder and recent psychosocial stressors. HEMATOLOGY Negative for prolonged bleeding, bruising easily, and swollen nodes. ENDOCRINE: Negative for cold or heat intolerance, polyuria, polydipsia and goiter. NEURO: negative for tremor, gait imbalance, syncope and seizures. The remainder of the review of systems is noncontributory.   Physical Exam: BP 121/63 (BP Location: Left Arm, Patient Position: Sitting, Cuff Size: Normal)   Pulse 71   Temp (!) 97.5 F (36.4 C) (Temporal)   Ht '5\' 6"'$  (1.676 m)   Wt 154 lb 3.2 oz (69.9 kg)   BMI 24.89 kg/m  GENERAL: The patient is AO x3, in no acute distress. HEENT: Head is normocephalic and atraumatic. EOMI are intact. Mouth is well hydrated and without lesions. NECK: Supple. No masses LUNGS: Clear to auscultation. No presence of rhonchi/wheezing/rales. Adequate chest expansion HEART: RRR, normal s1 and s2. ABDOMEN: Soft, nontender, no guarding, no peritoneal signs, and nondistended. BS +. No masses. EXTREMITIES: Without any cyanosis, clubbing, rash, lesions or edema. NEUROLOGIC: AOx3, no focal motor deficit. SKIN: no  jaundice, no rashes  Imaging/Labs: as above  I personally reviewed and interpreted the available labs, imaging and endoscopic files.  Impression and Plan: Maria [name] is a 62 y.o. female with PMH GERD, cognitive impairment, seizures, who presents for follow up of GERD. Patient has had adequate response to low dose PPI with adequate GERD control, will need to control at the same dose.  Having minimal constipation with stool softeners, which she can keep taking.  Finally, she is due for colonoscopy given history of colon polyps and piecemeal polypectomy, will schedule repeat colonoscopy.  - Schedule colonoscopy - Continue omeprazole 20 mg qday - Continue stool softeners daily  All questions were answered.      Maria Peppers, MD Gastroenterology and Hepatology [facility]

## 2022-07-04 NOTE — Patient Instructions (Signed)
Schedule colonoscopy Continue omeprazole 20 mg qday Continue stool softeners daily

## 2022-07-10 ENCOUNTER — Other Ambulatory Visit: Payer: Self-pay

## 2022-07-10 MED ORDER — DIVALPROEX SODIUM 500 MG PO DR TAB: DELAYED_RELEASE_TABLET | ORAL | 3 refills | Status: DC

## 2022-07-24 DIAGNOSIS — S00571A Other superficial bite of lip, initial encounter: Secondary | ICD-10-CM | POA: Diagnosis not present

## 2022-08-07 ENCOUNTER — Ambulatory Visit (HOSPITAL_BASED_OUTPATIENT_CLINIC_OR_DEPARTMENT_OTHER): Payer: Medicare Other | Admitting: Certified Registered"

## 2022-08-07 ENCOUNTER — Encounter (INDEPENDENT_AMBULATORY_CARE_PROVIDER_SITE_OTHER): Payer: Self-pay | Admitting: *Deleted

## 2022-08-07 ENCOUNTER — Encounter (HOSPITAL_COMMUNITY)
Admission: RE | Disposition: A | Discharge status: Home / Self Care | Payer: Self-pay | Source: Home / Self Care | Attending: Gastroenterology

## 2022-08-07 ENCOUNTER — Ambulatory Visit (HOSPITAL_COMMUNITY): Payer: Medicare Other | Admitting: Certified Registered"

## 2022-08-07 ENCOUNTER — Encounter (HOSPITAL_COMMUNITY): Payer: Self-pay | Admitting: Gastroenterology

## 2022-08-07 ENCOUNTER — Other Ambulatory Visit: Payer: Self-pay

## 2022-08-07 ENCOUNTER — Ambulatory Visit (HOSPITAL_COMMUNITY)
Admission: RE | Admit: 2022-08-07 | Discharge: 2022-08-07 | Disposition: A | Discharge status: Home / Self Care | Payer: Medicare Other | Attending: Gastroenterology | Admitting: Gastroenterology

## 2022-08-07 DIAGNOSIS — Z860100 Personal history of colon polyps, unspecified: Secondary | ICD-10-CM

## 2022-08-07 DIAGNOSIS — D125 Benign neoplasm of sigmoid colon: Secondary | ICD-10-CM

## 2022-08-07 DIAGNOSIS — D122 Benign neoplasm of ascending colon: Secondary | ICD-10-CM | POA: Diagnosis not present

## 2022-08-07 DIAGNOSIS — R569 Unspecified convulsions: Secondary | ICD-10-CM | POA: Diagnosis not present

## 2022-08-07 DIAGNOSIS — Z87891 Personal history of nicotine dependence: Secondary | ICD-10-CM | POA: Insufficient documentation

## 2022-08-07 DIAGNOSIS — D124 Benign neoplasm of descending colon: Secondary | ICD-10-CM

## 2022-08-07 DIAGNOSIS — F79 Unspecified intellectual disabilities: Secondary | ICD-10-CM | POA: Insufficient documentation

## 2022-08-07 DIAGNOSIS — F419 Anxiety disorder, unspecified: Secondary | ICD-10-CM | POA: Diagnosis not present

## 2022-08-07 DIAGNOSIS — K219 Gastro-esophageal reflux disease without esophagitis: Secondary | ICD-10-CM | POA: Insufficient documentation

## 2022-08-07 DIAGNOSIS — Z1211 Encounter for screening for malignant neoplasm of colon: Secondary | ICD-10-CM | POA: Diagnosis not present

## 2022-08-07 DIAGNOSIS — K635 Polyp of colon: Secondary | ICD-10-CM | POA: Diagnosis not present

## 2022-08-07 DIAGNOSIS — D123 Benign neoplasm of transverse colon: Secondary | ICD-10-CM | POA: Diagnosis not present

## 2022-08-07 HISTORY — PX: SUBMUCOSAL LIFTING INJECTION: SHX6855

## 2022-08-07 HISTORY — PX: HEMOSTASIS CLIP PLACEMENT: SHX6857

## 2022-08-07 HISTORY — PX: POLYPECTOMY: SHX5525

## 2022-08-07 HISTORY — PX: COLONOSCOPY WITH PROPOFOL: SHX5780

## 2022-08-07 HISTORY — DX: Personal history of colon polyps, unspecified: Z86.0100

## 2022-08-07 LAB — HM COLONOSCOPY

## 2022-08-07 SURGERY — COLONOSCOPY WITH PROPOFOL
Anesthesia: General

## 2022-08-07 MED ORDER — SODIUM CHLORIDE FLUSH 0.9 % IV SOLN
INTRAVENOUS | Status: AC
  Filled 2022-08-07: qty 10

## 2022-08-07 MED ORDER — LIDOCAINE HCL (CARDIAC) PF 100 MG/5ML IV SOSY
PREFILLED_SYRINGE | INTRAVENOUS | Status: DC | PRN
  Administered 2022-08-07: 50 mg via INTRAVENOUS

## 2022-08-07 MED ORDER — LACTATED RINGERS IV SOLN: INTRAVENOUS | Status: DC | PRN

## 2022-08-07 MED ORDER — PROPOFOL 10 MG/ML IV BOLUS
INTRAVENOUS | Status: DC | PRN
  Administered 2022-08-07: 100 mg via INTRAVENOUS
  Administered 2022-08-07: 40 mg via INTRAVENOUS

## 2022-08-07 MED ORDER — PROPOFOL 500 MG/50ML IV EMUL
INTRAVENOUS | Status: DC | PRN
  Administered 2022-08-07: 150 ug/kg/min via INTRAVENOUS

## 2022-08-07 NOTE — Op Note (Signed)
[facility] Patient Name: Maria [name] Procedure Date: 08/07/2022 7:05 AM MRN: [id number] Date of Birth: June 16, 1960 Attending MD: [name] , , [id number] CSN: [id number] Age: 62 Admit Type: Outpatient Procedure:                Colonoscopy Indications:              High risk colon cancer surveillance: Personal                            history of sessile serrated colon polyp (less than                            10 mm in size) with no dysplasia Providers:                [name], Caprice Kluver, Ladoris Gene                            Technician, Technician Referring MD:              Medicines:                Monitored Anesthesia Care Complications:            No immediate complications. Estimated Blood Loss:     Estimated blood loss: none. Procedure:                Pre-Anesthesia Assessment:                           - Prior to the procedure, a History and Physical                            was performed, and patient medications, allergies                            and sensitivities were reviewed. The patient's                            tolerance of previous anesthesia was reviewed.                           - The risks and benefits of the procedure and the                            sedation options and risks were discussed with the                            patient. All questions were answered and informed                            consent was obtained.                           - ASA Grade Assessment: III - A patient with severe                            systemic  disease.                           After obtaining informed consent, the colonoscope                            was passed under direct vision. Throughout the                            procedure, the patient's blood pressure, pulse, and                            oxygen saturations were monitored continuously. The                            [id number]-[id number] scope was introduced through                             the anus and advanced to the the cecum, identified                            by appendiceal orifice and ileocecal valve. The                            colonoscopy was performed without difficulty. The                            patient tolerated the procedure well. The quality                            of the bowel preparation was adequate to identify                            polyps greater than 5 mm in size. Scope In: 7:40:57 AM Scope Out: 8:44:01 AM Scope Withdrawal Time: 0 hours 51 minutes 45 seconds  Total Procedure Duration: 1 hour 3 minutes 4 seconds  Findings:      The perianal and digital rectal examinations were normal.      A 15 to 17 mm polyp was found in the proximal ascending colon. The polyp       was carpet-like. Area was successfully injected with 10 mL Eleview for a       lift polypectomy. Imaging was performed using white light and narrow       band imaging to visualize the mucosa and demarcate the polyp site after       injection for EMR purposes. The polyp was removed with a piecemeal       technique using a combination of cold and hot snare. Resection and       retrieval were complete. After completeing the polypectomy, the edges of       the polypectomy site were ablated with the tip of the snare, soft       coagulation settings. To achieve post polypectomy hemostasis, soft       coagulation with hot forceps was performed in a vessel. To prevent       bleeding after the polypectomy, two hemostatic clips  were successfully       placed. Clip manufacturer: [employer]. There was no bleeding at       the end of the procedure.      Four sessile polyps were found in the transverse colon and ascending       colon. The polyps were 3 to 6 mm in size. These polyps were removed with       a cold snare. Resection and retrieval were complete.      Seven sessile polyps were found in the sigmoid colon and descending       colon. The polyps were 2 to 8  mm in size. These polyps were removed with       a cold snare. Resection and retrieval were complete.      The retroflexed view of the distal rectum and anal verge was normal and       showed no anal or rectal abnormalities. Impression:               - One 15 to 17 mm polyp in the proximal ascending                            colon, removed piecemeal using a hot snare.                            Resected and retrieved via EMR. Clips were placed.                            Clip manufacturer: [employer].                           - Four 3 to 6 mm polyps in the transverse colon and                            in the ascending colon, removed with a cold snare.                            Resected and retrieved.                           - Seven 2 to 8 mm polyps in the sigmoid colon and                            in the descending colon, removed with a cold snare.                            Resected and retrieved.                           - The distal rectum and anal verge are normal on                            retroflexion view. Moderate Sedation:      Per Anesthesia Care Recommendation:           - Discharge patient to home (ambulatory).                           -  Resume previous diet.                           - Await pathology results.                           - Repeat colonoscopy in 1 year for surveillance                            after piecemeal polypectomy. Procedure Code(s):        --- Professional ---                           [phone], 59, Colonoscopy, flexible; with removal of                            tumor(s), polyp(s), or other lesion(s) by snare                            technique                           45381, Colonoscopy, flexible; with directed                            submucosal injection(s), any substance Diagnosis Code(s):        --- Professional ---                           Z86.010, Personal history of colonic polyps                           D12.2,  Benign neoplasm of ascending colon                           D12.3, Benign neoplasm of transverse colon (hepatic                            flexure or splenic flexure)                           D12.5, Benign neoplasm of sigmoid colon                           D12.4, Benign neoplasm of descending colon CPT copyright 2022 [employer]. All rights reserved. The codes documented in this report are preliminary and upon coder review may  be revised to meet current compliance requirements. [name], MD [name],  08/07/2022 8:54:51 AM This report has been signed electronically. Number of Addenda: 0

## 2022-08-07 NOTE — H&P (Signed)
Maria [name] is an 62 y.o. female.   Chief Complaint: history colon polyps HPI: 63 y/o F with PMH GERD, cognitive delay, seizures, coming for history of colon polyps.  The patient denies having any complaints such as melena, hematochezia, abdominal pain or distention, change in her bowel movement consistency or frequency, no changes in weight recently.  No family history of colorectal cancer.   Past Medical History:  Diagnosis Date   Constipation    Dysphagia    Fracture    R foot   GERD (gastroesophageal reflux disease)    History of shingles 10/2016   Mental retardation    lesion in head   Seizures ([place])    "not fully controlled on max doses of meds" (Neuro ofc note 12/2014)   Thrombocytopenia ([place])    Tremor     Past Surgical History:  Procedure Laterality Date   BIOPSY  09/16/2014   Procedure: BIOPSY;  Surgeon: Rogene Houston, MD;  Location: [facility];  Service: Endoscopy;;   CATARACT EXTRACTION     both eyes, May of 2015   COLONOSCOPY     COLONOSCOPY WITH PROPOFOL N/A 11/20/2018   Procedure: COLONOSCOPY WITH PROPOFOL;  Surgeon: Rogene Houston, MD;  Location: [facility];  Service: Endoscopy;  Laterality: N/A;   ESOPHAGEAL DILATION N/A 09/16/2014   Procedure: ESOPHAGEAL DILATION WITH 54FR MALONEY DILATOR;  Surgeon: Rogene Houston, MD;  Location: [facility];  Service: Endoscopy;  Laterality: N/A;   ESOPHAGOGASTRODUODENOSCOPY (EGD) WITH PROPOFOL N/A 09/16/2014   Procedure: ESOPHAGOGASTRODUODENOSCOPY (EGD) WITH PROPOFOL;  Surgeon: Rogene Houston, MD;  Location: [facility];  Service: Endoscopy;  Laterality: N/A;   LAPAROSCOPIC APPENDECTOMY N/A 07/14/2020   Procedure: APPENDECTOMY LAPAROSCOPIC;  Surgeon: Virl Cagey, MD;  Location: [facility];  Service: General;  Laterality: N/A;   MOUTH SURGERY     POLYPECTOMY  11/20/2018   Procedure: POLYPECTOMY;  Surgeon: Rogene Houston, MD;  Location: [facility];  Service: Endoscopy;;  colon   Skin graft to gum Right 08/2013    TONSILLECTOMY AND ADENOIDECTOMY     TOTAL ABDOMINAL HYSTERECTOMY      Family History  Problem Relation Age of Onset   High Cholesterol Mother    High blood pressure Mother    Diabetes Father    Social History:  reports that she quit smoking about 16 years ago. Her smoking use included cigarettes. She has a 22.50 pack-year smoking history. She has never used smokeless tobacco. She reports that she does not drink alcohol and does not use drugs.  Allergies:  Allergies  Allergen Reactions   Codeine Nausea And Vomiting   Lamictal [Lamotrigine] Rash    Medications Prior to Admission  Medication Sig Dispense Refill   atorvastatin (LIPITOR) 20 MG tablet Take 20 mg by mouth daily.     Calcium Carb-Cholecalciferol (CALCIUM/VITAMIN D PO) Take 1 tablet by mouth 2 (two) times daily.     clonazePAM (KLONOPIN) 0.5 MG tablet Take 1 tablet (0.5 mg total) by mouth 2 (two) times daily. 180 tablet 1   divalproex (DEPAKOTE) 500 MG DR tablet TAKE 2 TABLETS (1,000 MG TOTAL) BY MOUTH DAILY. 180 tablet 3   docusate sodium (COLACE) 100 MG capsule Take 200 mg by mouth at bedtime.     ibuprofen (ADVIL) 200 MG tablet Take 200 mg by mouth every 6 (six) hours as needed.     lacosamide (VIMPAT) 200 MG TABS tablet Take 1 tablet (200 mg total) by mouth 2 (two) times daily.  180 tablet 1   Multiple Vitamin (MULTIVITAMIN) capsule Take 1 capsule by mouth daily.     omeprazole (PRILOSEC) 20 MG capsule Take 1 capsule (20 mg total) by mouth daily. 90 capsule 3   oxybutynin (DITROPAN) 5 MG tablet TAKE 1 TABLET AT BEDTIME 90 tablet 3   VIMPAT 200 MG TABS tablet Take 1 tablet (200 mg total) by mouth 2 (two) times daily. 180 tablet 1   calcium carbonate (TUMS - DOSED IN MG ELEMENTAL CALCIUM) 500 MG chewable tablet Chew 1 tablet by mouth as needed for indigestion or heartburn.      No results found for this or any previous visit (from the past 48 hour(s)). No results found.  Review of Systems  All other systems reviewed  and are negative.   Blood pressure (!) 149/61, pulse 71, temperature 97.9 F (36.6 C), temperature source Oral, resp. rate 13, height '5\' 6"'$  (1.676 m), weight 65.8 kg, SpO2 100 %. Physical Exam  GENERAL: The patient is AO x3, in no acute distress. HEENT: Head is normocephalic and atraumatic. EOMI are intact. Mouth is well hydrated and without lesions. NECK: Supple. No masses LUNGS: Clear to auscultation. No presence of rhonchi/wheezing/rales. Adequate chest expansion HEART: RRR, normal s1 and s2. ABDOMEN: Soft, nontender, no guarding, no peritoneal signs, and nondistended. BS +. No masses. EXTREMITIES: Without any cyanosis, clubbing, rash, lesions or edema. NEUROLOGIC: AOx3, no focal motor deficit. SKIN: no jaundice, no rashes  Assessment/Plan 62 y/o F with PMH GERD, cognitive delay, seizures, coming for history of colon polyps. We will proceed with colonoscopy.  Harvel Quale, MD 08/07/2022, 7:32 AM

## 2022-08-07 NOTE — Discharge Instructions (Signed)
You are being discharged to home.  Resume your previous diet.  We are waiting for your pathology results.  Your physician has recommended a repeat colonoscopy in one year for surveillance after today's piecemeal polypectomy.

## 2022-08-07 NOTE — Transfer of Care (Signed)
Immediate Anesthesia Transfer of Care Note  Patient: Maria [name]  Procedure(s) Performed: COLONOSCOPY WITH PROPOFOL POLYPECTOMY SUBMUCOSAL LIFTING INJECTION HEMOSTASIS CLIP PLACEMENT  Patient Location: Endoscopy Unit  Anesthesia Type:General  Level of Consciousness: drowsy  Airway & Oxygen Therapy: Patient Spontanous Breathing  Post-op Assessment: Report given to RN and Post -op Vital signs reviewed and stable  Post vital signs: Reviewed and stable  Last Vitals:  Vitals Value Taken Time  BP    Temp    Pulse    Resp    SpO2      Last Pain:  Vitals:   08/07/22 0736  TempSrc:   PainSc: 0-No pain         Complications: No notable events documented.

## 2022-08-07 NOTE — Anesthesia Preprocedure Evaluation (Signed)
Anesthesia Evaluation  Patient identified by MRN, date of birth, ID band Patient awake    Reviewed: Allergy & Precautions, H&P , NPO status , Patient's Chart, lab work & pertinent test results, reviewed documented beta blocker date and time   Airway Mallampati: II  TM Distance: >3 FB Neck ROM: full    Dental no notable dental hx.    Pulmonary neg pulmonary ROS, former smoker   Pulmonary exam normal breath sounds clear to auscultation       Cardiovascular Exercise Tolerance: Good negative cardio ROS  Rhythm:regular Rate:Normal     Neuro/Psych Seizures -,   Anxiety     negative neurological ROS  negative psych ROS   GI/Hepatic negative GI ROS, Neg liver ROS,GERD  ,,  Endo/Other  negative endocrine ROSType 2    Renal/GU Renal diseasenegative Renal ROS  negative genitourinary   Musculoskeletal   Abdominal   Peds  Hematology negative hematology ROS (+) Blood dyscrasia, anemia   Anesthesia Other Findings   Reproductive/Obstetrics negative OB ROS                             Anesthesia Physical Anesthesia Plan  ASA: 2  Anesthesia Plan: General   Post-op Pain Management:    Induction:   PONV Risk Score and Plan: Propofol infusion  Airway Management Planned:   Additional Equipment:   Intra-op Plan:   Post-operative Plan:   Informed Consent: I have reviewed the patients History and Physical, chart, labs and discussed the procedure including the risks, benefits and alternatives for the proposed anesthesia with the patient or authorized representative who has indicated his/her understanding and acceptance.     Dental Advisory Given  Plan Discussed with: CRNA  Anesthesia Plan Comments:        Anesthesia Quick Evaluation

## 2022-08-08 ENCOUNTER — Other Ambulatory Visit: Payer: Self-pay | Admitting: Neurology

## 2022-08-08 DIAGNOSIS — R569 Unspecified convulsions: Secondary | ICD-10-CM

## 2022-08-08 LAB — SURGICAL PATHOLOGY

## 2022-08-08 MED ORDER — LACOSAMIDE 200 MG PO TABS: 200.0000 mg | ORAL_TABLET | Freq: Two times a day (BID) | ORAL | 1 refills | Status: DC

## 2022-08-08 NOTE — Anesthesia Postprocedure Evaluation (Signed)
Anesthesia Post Note  Patient: Maria [name]  Procedure(s) Performed: COLONOSCOPY WITH PROPOFOL POLYPECTOMY SUBMUCOSAL LIFTING INJECTION HEMOSTASIS CLIP PLACEMENT  Patient location during evaluation: Phase II Anesthesia Type: General Level of consciousness: awake Pain management: pain level controlled Vital Signs Assessment: post-procedure vital signs reviewed and stable Respiratory status: spontaneous breathing and respiratory function stable Cardiovascular status: blood pressure returned to baseline and stable Postop Assessment: no headache and no apparent nausea or vomiting Anesthetic complications: no Comments: Late entry   No notable events documented.   Last Vitals:  Vitals:   08/07/22 0655 08/07/22 0847  BP: (!) 149/61 137/61  Pulse: 71 63  Resp: 13 14  Temp: 36.6 C (!) 36.4 C  SpO2: 100% 97%    Last Pain:  Vitals:   08/07/22 0847  TempSrc: Axillary  PainSc: 0-No pain                 Louann Sjogren

## 2022-08-08 NOTE — Telephone Encounter (Signed)
Last seen 04/23/22 Next visit 11/12/22  Per [place] registry:    Rx refill sent to Judson Roch NP

## 2022-08-08 NOTE — Telephone Encounter (Signed)
Pt is needing a refill request for her lacosamide (VIMPAT) 200 MG TABS tablet sent to the [facility]

## 2022-08-12 NOTE — Progress Notes (Signed)
(  1) vessel at the resected polyp

## 2022-08-19 ENCOUNTER — Encounter (HOSPITAL_COMMUNITY): Payer: Self-pay | Admitting: Gastroenterology

## 2022-08-27 ENCOUNTER — Other Ambulatory Visit (INDEPENDENT_AMBULATORY_CARE_PROVIDER_SITE_OTHER): Payer: Self-pay | Admitting: Gastroenterology

## 2022-08-27 ENCOUNTER — Telehealth (INDEPENDENT_AMBULATORY_CARE_PROVIDER_SITE_OTHER): Payer: Self-pay

## 2022-08-27 DIAGNOSIS — K219 Gastro-esophageal reflux disease without esophagitis: Secondary | ICD-10-CM

## 2022-08-27 MED ORDER — OMEPRAZOLE 40 MG PO CPDR: 40.0000 mg | DELAYED_RELEASE_CAPSULE | Freq: Every day | ORAL | 3 refills | Status: DC

## 2022-08-27 NOTE — Telephone Encounter (Signed)
Patient mother made aware the 40 mg omeprazole was sent to [facility].

## 2022-08-27 NOTE — Telephone Encounter (Signed)
Yes, can increase to 40 mg. I sent the 40 mg dose to [facility]. In the meantime, she can take two 20 mg pills daily.

## 2022-08-27 NOTE — Telephone Encounter (Signed)
Patient mother called to report that since her decrease of omeprazole from 40 mg to 20 mg patient is having to eat a lot of tums. Patient mother wants to know if we can increase it back to 40 mg. Please advise.

## 2022-09-23 ENCOUNTER — Other Ambulatory Visit: Payer: Self-pay | Admitting: Neurology

## 2022-09-23 MED ORDER — CLONAZEPAM 0.5 MG PO TABS: 0.5000 mg | ORAL_TABLET | Freq: Two times a day (BID) | ORAL | 1 refills | Status: DC

## 2022-09-23 NOTE — Telephone Encounter (Signed)
Pt is requesting a refill for clonazePAM (KLONOPIN) 0.5 MG tablet. ° °Pharmacy: [facility]  ° ° °

## 2022-09-23 NOTE — Telephone Encounter (Signed)
Requested Prescriptions   Pending Prescriptions Disp Refills   clonazePAM (KLONOPIN) 0.5 MG tablet 180 tablet 1    Sig: Take 1 tablet (0.5 mg total) by mouth 2 (two) times daily.   Last seen by slack np on 04/23/22, upcoming visit 11/12/22, routing to provider to fill  clonazePAM Adherence  Dispenses:   Dispensed Days Supply Quantity Provider Pharmacy  CLONAZEPAM 0.5 MG TABLET 06/22/2022 90 180 tablet Windell Norfolk, MD [facility]...  CLONAZEPAM 0.5 MG TABLET 04/10/2022 90 180 tablet Windell Norfolk, MD [facility]...  CLONAZEPAM 0.5 MG TABLET 12/22/2021 90 180 tablet Glean Salvo, NP [facility]...  CLONAZEPAM 0.5 MG TABLET 10/10/2021 90 180 tablet Glean Salvo, NP [facility].Marland KitchenMarland Kitchen

## 2022-09-24 DIAGNOSIS — E785 Hyperlipidemia, unspecified: Secondary | ICD-10-CM | POA: Diagnosis not present

## 2022-11-12 ENCOUNTER — Ambulatory Visit
Admission: EM | Admit: 2022-11-12 | Discharge: 2022-11-12 | Disposition: A | Discharge status: Home / Self Care | Payer: Medicare Other | Attending: Nurse Practitioner | Admitting: Nurse Practitioner

## 2022-11-12 ENCOUNTER — Ambulatory Visit: Payer: Medicare Other | Admitting: Neurology

## 2022-11-12 ENCOUNTER — Other Ambulatory Visit: Payer: Self-pay

## 2022-11-12 ENCOUNTER — Telehealth: Payer: Self-pay | Admitting: Neurology

## 2022-11-12 ENCOUNTER — Ambulatory Visit (INDEPENDENT_AMBULATORY_CARE_PROVIDER_SITE_OTHER): Payer: Medicare Other | Admitting: Neurology

## 2022-11-12 ENCOUNTER — Encounter: Payer: Self-pay | Admitting: Emergency Medicine

## 2022-11-12 VITALS — BP 155/74 | HR 84 | Ht 65.0 in | Wt 154.0 lb

## 2022-11-12 DIAGNOSIS — M549 Dorsalgia, unspecified: Secondary | ICD-10-CM

## 2022-11-12 DIAGNOSIS — G40909 Epilepsy, unspecified, not intractable, without status epilepticus: Secondary | ICD-10-CM

## 2022-11-12 MED ORDER — IBUPROFEN 800 MG PO TABS: 800.0000 mg | ORAL_TABLET | Freq: Three times a day (TID) | ORAL | 0 refills | Status: DC | PRN

## 2022-11-12 MED ORDER — TIZANIDINE HCL 4 MG PO TABS: 4.0000 mg | ORAL_TABLET | Freq: Three times a day (TID) | ORAL | 0 refills | Status: DC | PRN

## 2022-11-12 NOTE — Progress Notes (Signed)
Patient: Maria [name] Date of Birth: [birth year]  Reason for Visit: Follow up History from: Patient, brother Primary Neurologist: Camara   ASSESSMENT AND PLAN 62 y.o. year old female   1.  Seizures -No recent seizures reported -Continue current doses of Clonazepam, Depakote, Vimpat; refills appear current (1 more 3 month on Klonopin, Vimpat due in August) -Check labs today including Depakote, Lamictal level -Take urine sample to PCP to screen for UTI -Follow-up in 6 months or sooner if needed, call for seizures  HISTORY OF PRESENT ILLNESS: Today 11/12/22 Here today with her brother. Had a fall last night getting up to use the bathroom, went to urgent care. For right flank pain. Given ibuprofen, tizanidine today.  No seizures reported. Brother mentions concern for UTI? Brother has noted some confusion. Her mother who manages her medications, is 31. The patient won't do PT. Remains on Klonopin, Depakote, Vimpat. She seems a bit more scattered today than in the past.   04/23/22 SS: Here today with her nephew. No seizures. Remains on Depakote, Klonopin, Vimpat. No falls. No changes to her health. Her mother manages her medications. Her mother is 40. EEG in April 2023 was abnormal due to the presence of frequent spike and slow wave discharges that is consistent with a generalized epileptogenic potential, also present was diffuse slowing.  No problems or concerns.  HISTORY  Dr.Camara 09/04/21: Patient present today for follow-up, she is accompanied by her mother.  Since last visit he denies any additional seizures.  She still on Depakote, Vimpat and clonazepam.  She reported she has been having increased falls, last fall was 2 weeks ago.  Patient denies any prodrome prior to fall, denies any weakness, no dizziness, no lightheadedness seizures falls.  Patient thinks she is not having seizures with these falls.  Per mother her seizures are described as generalized tonic-clonic seizures.    REVIEW OF SYSTEMS: Out of a complete 14 system review of symptoms, the patient complains only of the following symptoms, and all other reviewed systems are negative.  See HPI  ALLERGIES: Allergies  Allergen Reactions   Codeine Nausea And Vomiting   Lamictal [Lamotrigine] Rash    HOME MEDICATIONS: Outpatient Medications Prior to Visit  Medication Sig Dispense Refill   atorvastatin (LIPITOR) 20 MG tablet Take 20 mg by mouth daily.     Calcium Carb-Cholecalciferol (CALCIUM/VITAMIN D PO) Take 1 tablet by mouth 2 (two) times daily.     calcium carbonate (TUMS - DOSED IN MG ELEMENTAL CALCIUM) 500 MG chewable tablet Chew 1 tablet by mouth as needed for indigestion or heartburn.     clonazePAM (KLONOPIN) 0.5 MG tablet Take 1 tablet (0.5 mg total) by mouth 2 (two) times daily. 180 tablet 1   divalproex (DEPAKOTE) 500 MG DR tablet TAKE 2 TABLETS (1,000 MG TOTAL) BY MOUTH DAILY. 180 tablet 3   docusate sodium (COLACE) 100 MG capsule Take 200 mg by mouth at bedtime.     ibuprofen (ADVIL) 800 MG tablet Take 1 tablet (800 mg total) by mouth every 8 (eight) hours as needed. Take with food to prevent GI upset 21 tablet 0   lacosamide (VIMPAT) 200 MG TABS tablet Take 1 tablet (200 mg total) by mouth 2 (two) times daily. 180 tablet 1   Multiple Vitamin (MULTIVITAMIN) capsule Take 1 capsule by mouth daily.     omeprazole (PRILOSEC) 40 MG capsule Take 1 capsule (40 mg total) by mouth daily. 90 capsule 3   oxybutynin (DITROPAN) 5 MG tablet TAKE  1 TABLET AT BEDTIME 90 tablet 3   tiZANidine (ZANAFLEX) 4 MG tablet Take 1 tablet (4 mg total) by mouth every 8 (eight) hours as needed for muscle spasms. Do not take with alcohol or while driving or operating heavy machinery.  May cause drowsiness. 30 tablet 0   VIMPAT 200 MG TABS tablet Take 1 tablet (200 mg total) by mouth 2 (two) times daily. 180 tablet 1   No facility-administered medications prior to visit.    PAST MEDICAL HISTORY: Past Medical History:   Diagnosis Date   Constipation    Dysphagia    Fracture    R foot   GERD (gastroesophageal reflux disease)    History of shingles 10/2016   Mental retardation    lesion in head   Seizures (HCC)    "not fully controlled on max doses of meds" (Neuro ofc note 12/2014)   Thrombocytopenia (HCC)    Tremor     PAST SURGICAL HISTORY: Past Surgical History:  Procedure Laterality Date   BIOPSY  09/16/2014   Procedure: BIOPSY;  Surgeon: Malissa Hippo, MD;  Location: [facility];  Service: Endoscopy;;   CATARACT EXTRACTION     both eyes, May of 2015   COLONOSCOPY     COLONOSCOPY WITH PROPOFOL N/A 11/20/2018   Procedure: COLONOSCOPY WITH PROPOFOL;  Surgeon: Malissa Hippo, MD;  [facility];  Service: Endoscopy;  Laterality: N/A;   COLONOSCOPY WITH PROPOFOL N/A 08/07/2022   Procedure: COLONOSCOPY WITH PROPOFOL;  Surgeon: Dolores Frame, MD;  [facility];  Service: Gastroenterology;  Laterality: N/A;  7:30am, asa 1-2   ESOPHAGEAL DILATION N/A 09/16/2014   Procedure: ESOPHAGEAL DILATION WITH 54FR MALONEY DILATOR;  Surgeon: Malissa Hippo, MD;  Location: [facility];  Service: Endoscopy;  Laterality: N/A;   ESOPHAGOGASTRODUODENOSCOPY (EGD) WITH PROPOFOL N/A 09/16/2014   Procedure: ESOPHAGOGASTRODUODENOSCOPY (EGD) WITH PROPOFOL;  Surgeon: Malissa Hippo, MD;  Location: [facility];  Service: Endoscopy;  Laterality: N/A;   HEMOSTASIS CLIP PLACEMENT  08/07/2022   Procedure: HEMOSTASIS CLIP PLACEMENT;  Surgeon: Dolores Frame, MD;  [facility];  Service: Gastroenterology;;   LAPAROSCOPIC APPENDECTOMY N/A 07/14/2020   Procedure: APPENDECTOMY LAPAROSCOPIC;  Surgeon: Lucretia Roers, MD;  Location: [facility];  Service: General;  Laterality: N/A;   MOUTH SURGERY     POLYPECTOMY  11/20/2018   Procedure: POLYPECTOMY;  Surgeon: Malissa Hippo, MD;  [facility];  Service: Endoscopy;;  colon   POLYPECTOMY  08/07/2022   Procedure: POLYPECTOMY;  Surgeon:  Dolores Frame, MD;  [facility];  Service: Gastroenterology;;   Skin graft to gum Right 08/2013   SUBMUCOSAL LIFTING INJECTION  08/07/2022   Procedure: SUBMUCOSAL LIFTING INJECTION;  Surgeon: Dolores Frame, MD;  [facility];  Service: Gastroenterology;;   TONSILLECTOMY AND ADENOIDECTOMY     TOTAL ABDOMINAL HYSTERECTOMY      FAMILY HISTORY: Family History  Problem Relation Age of Onset   High Cholesterol Mother    High blood pressure Mother    Diabetes Father     SOCIAL HISTORY: Social History   Socioeconomic History   Marital status: Single    Spouse name: Not on file   Number of children: 0   Years of education: 10   Highest education level: Not on file  Occupational History    Employer: UNEMPLOYED  Tobacco Use   Smoking status: Former    Packs/day: 1.50    Years: 15.00  Additional pack years: 0.00    Total pack years: 22.50    Types: Cigarettes    Quit date: 05/22/2006    Years since quitting: 16.4   Smokeless tobacco: Never  Vaping Use   Vaping Use: Never used  Substance and Sexual Activity   Alcohol use: No    Alcohol/week: 0.0 standard drinks of alcohol   Drug use: No   Sexual activity: Never    Birth control/protection: Other-see comments    Comment: Hysterectomy  Other Topics Concern   Not on file  Social History Narrative   Patient is single and lives with her mother [name])      Patient drinks 3 cups of caffeine daily.   Patient is right handed.         Social Determinants of Health   Financial Resource Strain: Not on file  Food Insecurity: Not on file  Transportation Needs: Not on file  Physical Activity: Not on file  Stress: Not on file  Social Connections: Not on file  Intimate Partner Violence: Not on file   PHYSICAL EXAM  Vitals:   11/12/22 1513  BP: (!) 155/74  Pulse: 84  Weight: 154 lb (69.9 kg)  Height: 5\' 5"  (1.651 m)    Body mass index is 25.63 kg/m.  Generalized: Well  developed, in no acute distress  Neurological examination  Mentation: Alert, is limited historian, some cognitive impairment, follows most exam commands, little more confused today, scattered  Cranial nerve II-XII: Pupils were equal round reactive to light. Extraocular movements were full, visual field were full on confrontational test. Facial sensation and strength were normal. Head turning and shoulder shrug were normal and symmetric. Motor: The motor testing reveals 5 over 5 strength of all 4 extremities. Good symmetric motor tone is noted throughout.  Sensory: Sensory testing is intact to soft touch on all 4 extremities. No evidence of extinction is noted.  Coordination: Cerebellar testing reveals good finger-nose-finger and heel-to-shin bilaterally.  Mild tremor with finger-nose-finger. Gait and station: Gait cautious, looks down, tremor with right hand  Reflexes: Deep tendon reflexes are symmetric and normal bilaterally.   DIAGNOSTIC DATA (LABS, IMAGING, TESTING) - I reviewed patient records, labs, notes, testing and imaging myself where available.  Lab Results  Component Value Date   WBC 5.8 11/14/2021   HGB 12.3 11/14/2021   HCT 36.6 11/14/2021   MCV 94.3 11/14/2021   PLT 139 (L) 11/14/2021      Component Value Date/Time   NA 140 11/14/2021 0148   NA 143 11/05/2016 1104   K 3.8 11/14/2021 0148   CL 104 11/14/2021 0148   CO2 25 11/14/2021 0148   GLUCOSE 158 (H) 11/14/2021 0148   BUN 20 11/14/2021 0148   BUN 25 (H) 11/05/2016 1104   CREATININE 0.89 11/14/2021 0148   CALCIUM 9.1 11/14/2021 0148   PROT 6.5 11/14/2021 0148   PROT 6.8 11/05/2016 1104   ALBUMIN 4.0 11/14/2021 0148   ALBUMIN 4.2 11/05/2016 1104   AST 16 11/14/2021 0148   ALT 22 11/14/2021 0148   ALKPHOS 62 11/14/2021 0148   BILITOT 0.4 11/14/2021 0148   BILITOT 0.3 11/05/2016 1104   GFRNONAA >60 11/14/2021 0148   GFRAA >60 06/11/2017 1542   Lab Results  Component Value Date   CHOL  10/13/2009    169         ATP III CLASSIFICATION:  <200     mg/dL   Desirable  161-096  mg/dL   Borderline High  >=045  mg/dL   High          HDL 47 10/13/2009   LDLCALC (H) 10/13/2009    108        Total Cholesterol/HDL:CHD Risk Coronary Heart Disease Risk Table                     Men   Women  1/2 Average Risk   3.4   3.3  Average Risk       5.0   4.4  2 X Average Risk   9.6   7.1  3 X Average Risk  23.4   11.0        Use the calculated Patient Ratio above and the CHD Risk Table to determine the patient's CHD Risk.        ATP III CLASSIFICATION (LDL):  <100     mg/dL   Optimal  540-981  mg/dL   Near or Above                    Optimal  130-159  mg/dL   Borderline  191-478  mg/dL   High  >295     mg/dL   Very High   TRIG 69 62/13/0865   CHOLHDL 3.6 10/13/2009   Lab Results  Component Value Date   HGBA1C 5.7 (H) 04/17/2020   Lab Results  Component Value Date   VITAMINB12 1,246 (H) 05/08/2020   Lab Results  Component Value Date   TSH 2.660 03/16/2014    Margie Ege, AGNP-C, DNP 11/12/2022, 3:37 PM [facility] [address] [place], [place] [address] [phone]

## 2022-11-12 NOTE — Discharge Instructions (Signed)
Take the ibuprofen every 8 hours as needed for pain; be sure to take this with food.  Alternate with Tylenol [phone] mg every 6 hours.  Start ice/heat to the painful areas.   Start tizanidine every 8 hours as needed for muscular pain additionally.  Be careful as this may make you a bit drowsy.   Follow up with PCP later this week with no improvement or worsening of symptoms despite treatment.

## 2022-11-12 NOTE — Telephone Encounter (Signed)
Returned a call to pts mother who stated that she fell yesterday and wants an xray I explained that that she needs to be seen at urgent care so they can order xray as we only see her for neuro. They stated that they plan to bring her to her appt and then to [facility] at [facility] after seeing Dr. Teresa Coombs.

## 2022-11-12 NOTE — Progress Notes (Deleted)
Patient: Maria [name] Date of Birth: May 04, 1961  Reason for Visit: Follow up History from: Patient, nephew  Primary Neurologist: Camara   ASSESSMENT AND PLAN 62 y.o. year old female   1.  Seizures -No recent seizures reported -Continue current doses of Clonazepam, Depakote, Vimpat; refills appear current  -Check labs at next visit -Summary of most recent labs (11/14/21 platelet 139-this is baseline; 09/04/21 vitamin D 55.5, Vimpat 13.6; 08/28/21 Depakote 90) -Follow-up in 6 months or sooner if needed  HISTORY OF PRESENT ILLNESS: Today 11/12/22   04/23/22 SS: Here today with her nephew. No seizures. Remains on Depakote, Klonopin, Vimpat. No falls. No changes to her health. Her mother manages her medications. Her mother is 82. EEG in April 2023 was abnormal due to the presence of frequent spike and slow wave discharges that is consistent with a generalized epileptogenic potential, also present was diffuse slowing.  No problems or concerns.  HISTORY  Dr.Camara 09/04/21: Patient present today for follow-up, she is accompanied by her mother.  Since last visit he denies any additional seizures.  She still on Depakote, Vimpat and clonazepam.  She reported she has been having increased falls, last fall was 2 weeks ago.  Patient denies any prodrome prior to fall, denies any weakness, no dizziness, no lightheadedness seizures falls.  Patient thinks she is not having seizures with these falls.  Per mother her seizures are described as generalized tonic-clonic seizures.   REVIEW OF SYSTEMS: Out of a complete 14 system review of symptoms, the patient complains only of the following symptoms, and all other reviewed systems are negative.  See HPI  ALLERGIES: Allergies  Allergen Reactions   Codeine Nausea And Vomiting   Lamictal [Lamotrigine] Rash    HOME MEDICATIONS: Outpatient Medications Prior to Visit  Medication Sig Dispense Refill   atorvastatin (LIPITOR) 20 MG tablet Take 20 mg by  mouth daily.     Calcium Carb-Cholecalciferol (CALCIUM/VITAMIN D PO) Take 1 tablet by mouth 2 (two) times daily.     calcium carbonate (TUMS - DOSED IN MG ELEMENTAL CALCIUM) 500 MG chewable tablet Chew 1 tablet by mouth as needed for indigestion or heartburn.     clonazePAM (KLONOPIN) 0.5 MG tablet Take 1 tablet (0.5 mg total) by mouth 2 (two) times daily. 180 tablet 1   divalproex (DEPAKOTE) 500 MG DR tablet TAKE 2 TABLETS (1,000 MG TOTAL) BY MOUTH DAILY. 180 tablet 3   docusate sodium (COLACE) 100 MG capsule Take 200 mg by mouth at bedtime.     ibuprofen (ADVIL) 200 MG tablet Take 200 mg by mouth every 6 (six) hours as needed.     lacosamide (VIMPAT) 200 MG TABS tablet Take 1 tablet (200 mg total) by mouth 2 (two) times daily. 180 tablet 1   Multiple Vitamin (MULTIVITAMIN) capsule Take 1 capsule by mouth daily.     omeprazole (PRILOSEC) 40 MG capsule Take 1 capsule (40 mg total) by mouth daily. 90 capsule 3   oxybutynin (DITROPAN) 5 MG tablet TAKE 1 TABLET AT BEDTIME 90 tablet 3   VIMPAT 200 MG TABS tablet Take 1 tablet (200 mg total) by mouth 2 (two) times daily. 180 tablet 1   No facility-administered medications prior to visit.    PAST MEDICAL HISTORY: Past Medical History:  Diagnosis Date   Constipation    Dysphagia    Fracture    R foot   GERD (gastroesophageal reflux disease)    History of shingles 10/2016   Mental retardation  lesion in head   Seizures (HCC)    "not fully controlled on max doses of meds" (Neuro ofc note 12/2014)   Thrombocytopenia (HCC)    Tremor     PAST SURGICAL HISTORY: Past Surgical History:  Procedure Laterality Date   BIOPSY  09/16/2014   Procedure: BIOPSY;  Surgeon: Malissa Hippo, MD;  Location: [facility];  Service: Endoscopy;;   CATARACT EXTRACTION     both eyes, May of 2015   COLONOSCOPY     COLONOSCOPY WITH PROPOFOL N/A 11/20/2018   Procedure: COLONOSCOPY WITH PROPOFOL;  Surgeon: Malissa Hippo, MD;  [facility];  Service:  Endoscopy;  Laterality: N/A;   COLONOSCOPY WITH PROPOFOL N/A 08/07/2022   Procedure: COLONOSCOPY WITH PROPOFOL;  Surgeon: Dolores Frame, MD;  [facility];  Service: Gastroenterology;  Laterality: N/A;  7:30am, asa 1-2   ESOPHAGEAL DILATION N/A 09/16/2014   Procedure: ESOPHAGEAL DILATION WITH 54FR MALONEY DILATOR;  Surgeon: Malissa Hippo, MD;  Location: [facility];  Service: Endoscopy;  Laterality: N/A;   ESOPHAGOGASTRODUODENOSCOPY (EGD) WITH PROPOFOL N/A 09/16/2014   Procedure: ESOPHAGOGASTRODUODENOSCOPY (EGD) WITH PROPOFOL;  Surgeon: Malissa Hippo, MD;  Location: [facility];  Service: Endoscopy;  Laterality: N/A;   HEMOSTASIS CLIP PLACEMENT  08/07/2022   Procedure: HEMOSTASIS CLIP PLACEMENT;  Surgeon: Dolores Frame, MD;  [facility];  Service: Gastroenterology;;   LAPAROSCOPIC APPENDECTOMY N/A 07/14/2020   Procedure: APPENDECTOMY LAPAROSCOPIC;  Surgeon: Lucretia Roers, MD;  Location: [facility];  Service: General;  Laterality: N/A;   MOUTH SURGERY     POLYPECTOMY  11/20/2018   Procedure: POLYPECTOMY;  Surgeon: Malissa Hippo, MD;  [facility];  Service: Endoscopy;;  colon   POLYPECTOMY  08/07/2022   Procedure: POLYPECTOMY;  Surgeon: Dolores Frame, MD;  [facility];  Service: Gastroenterology;;   Skin graft to gum Right 08/2013   SUBMUCOSAL LIFTING INJECTION  08/07/2022   Procedure: SUBMUCOSAL LIFTING INJECTION;  Surgeon: Dolores Frame, MD;  [facility];  Service: Gastroenterology;;   TONSILLECTOMY AND ADENOIDECTOMY     TOTAL ABDOMINAL HYSTERECTOMY      FAMILY HISTORY: Family History  Problem Relation Age of Onset   High Cholesterol Mother    High blood pressure Mother    Diabetes Father     SOCIAL HISTORY: Social History   Socioeconomic History   Marital status: Single    Spouse name: Not on file   Number of children: 0   Years of education: 10   Highest education level: Not on file   Occupational History    Employer: UNEMPLOYED  Tobacco Use   Smoking status: Former    Packs/day: 1.50    Years: 15.00    Additional pack years: 0.00    Total pack years: 22.50    Types: Cigarettes    Quit date: 05/22/2006    Years since quitting: 16.4   Smokeless tobacco: Never  Vaping Use   Vaping Use: Never used  Substance and Sexual Activity   Alcohol use: No    Alcohol/week: 0.0 standard drinks of alcohol   Drug use: No   Sexual activity: Never    Birth control/protection: Other-see comments    Comment: Hysterectomy  Other Topics Concern   Not on file  Social History Narrative   Patient is single and lives with her mother [name])      Patient drinks 3 cups of caffeine daily.   Patient is right handed.  Social Determinants of Health   Financial Resource Strain: Not on file  Food Insecurity: Not on file  Transportation Needs: Not on file  Physical Activity: Not on file  Stress: Not on file  Social Connections: Not on file  Intimate Partner Violence: Not on file    PHYSICAL EXAM  There were no vitals filed for this visit.  There is no height or weight on file to calculate BMI.  Generalized: Well developed, in no acute distress  Neurological examination  Mentation: Alert, is limited historian, some cognitive impairment, follows most exam commands Cranial nerve II-XII: Pupils were equal round reactive to light. Extraocular movements were full, visual field were full on confrontational test. Facial sensation and strength were normal. Head turning and shoulder shrug were normal and symmetric. Motor: The motor testing reveals 5 over 5 strength of all 4 extremities. Good symmetric motor tone is noted throughout.  Sensory: Sensory testing is intact to soft touch on all 4 extremities. No evidence of extinction is noted.  Coordination: Cerebellar testing reveals good finger-nose-finger and heel-to-shin bilaterally.  Mild tremor with finger-nose-finger. Gait and  station: Gait cautious, looks down Reflexes: Deep tendon reflexes are symmetric and normal bilaterally.   DIAGNOSTIC DATA (LABS, IMAGING, TESTING) - I reviewed patient records, labs, notes, testing and imaging myself where available.  Lab Results  Component Value Date   WBC 5.8 11/14/2021   HGB 12.3 11/14/2021   HCT 36.6 11/14/2021   MCV 94.3 11/14/2021   PLT 139 (L) 11/14/2021      Component Value Date/Time   NA 140 11/14/2021 0148   NA 143 11/05/2016 1104   K 3.8 11/14/2021 0148   CL 104 11/14/2021 0148   CO2 25 11/14/2021 0148   GLUCOSE 158 (H) 11/14/2021 0148   BUN 20 11/14/2021 0148   BUN 25 (H) 11/05/2016 1104   CREATININE 0.89 11/14/2021 0148   CALCIUM 9.1 11/14/2021 0148   PROT 6.5 11/14/2021 0148   PROT 6.8 11/05/2016 1104   ALBUMIN 4.0 11/14/2021 0148   ALBUMIN 4.2 11/05/2016 1104   AST 16 11/14/2021 0148   ALT 22 11/14/2021 0148   ALKPHOS 62 11/14/2021 0148   BILITOT 0.4 11/14/2021 0148   BILITOT 0.3 11/05/2016 1104   GFRNONAA >60 11/14/2021 0148   GFRAA >60 06/11/2017 1542   Lab Results  Component Value Date   CHOL  10/13/2009    169        ATP III CLASSIFICATION:  <200     mg/dL   Desirable  161-096  mg/dL   Borderline High  >=045    mg/dL   High          HDL 47 10/13/2009   LDLCALC (H) 10/13/2009    108        Total Cholesterol/HDL:CHD Risk Coronary Heart Disease Risk Table                     Men   Women  1/2 Average Risk   3.4   3.3  Average Risk       5.0   4.4  2 X Average Risk   9.6   7.1  3 X Average Risk  23.4   11.0        Use the calculated Patient Ratio above and the CHD Risk Table to determine the patient's CHD Risk.        ATP III CLASSIFICATION (LDL):  <100     mg/dL   Optimal  409-811  mg/dL   Near or Above                    Optimal  130-159  mg/dL   Borderline  161-096  mg/dL   High  >045     mg/dL   Very High   TRIG 69 40/98/1191   CHOLHDL 3.6 10/13/2009   Lab Results  Component Value Date   HGBA1C 5.7 (H)  04/17/2020   Lab Results  Component Value Date   VITAMINB12 1,246 (H) 05/08/2020   Lab Results  Component Value Date   TSH 2.660 03/16/2014    Margie Ege, AGNP-C, DNP 11/12/2022, 5:40 AM [facility] [address], [address] [place], [place] [address] [phone]

## 2022-11-12 NOTE — Telephone Encounter (Signed)
Pt's mother fell and is needing to speak to the RN regarding today's appt. Please advise.

## 2022-11-12 NOTE — ED Provider Notes (Signed)
[facility]    CSN: [id number] Arrival date & time: 11/12/22  1311      History   Chief Complaint Chief Complaint  Patient presents with   Back Pain    HPI Maria [name] is a 62 y.o. female.   Patient presents today with brother who is acting as guardian today.  Patient's brother provides most of history secondary to history of mental retardation and reports this morning, patient fell in her room at about 3:00 in the morning.  He had to go in her room and help her stand up, reports she falls frequently.  Reports since that time, she has been complaining of pain on the right side of her body, for started in her rib cage knows her low back.  Has been taking ibuprofen for the pain with only minimal improvement.  Reports she is grimacing anytime she walks, however no apparent weakness in the lower extremities or decreased range of motion of the lower extremities.  No new incontinence, recurrent fall today, or shooting pain down the legs.  No known bruising, swelling, fever, nausea/vomiting since pain began.  Patient's brother states they are in a hurry today because they have an appointment in [place] in about 1 hour.    Past Medical History:  Diagnosis Date   Constipation    Dysphagia    Fracture    R foot   GERD (gastroesophageal reflux disease)    History of shingles 10/2016   Mental retardation    lesion in head   Seizures (HCC)    "not fully controlled on max doses of meds" (Neuro ofc note 12/2014)   Thrombocytopenia (HCC)    Tremor     Patient Active Problem List   Diagnosis Date Noted   History of colonic polyps 08/07/2022   Pain in right shoulder 08/24/2021   Clavicle pain 08/15/2021   Acute urinary tract infection 03/13/2021   Dysuria 03/13/2021   COVID-19 02/19/2021   Allergic rhinitis 10/24/2020   Anemia 10/24/2020   Anxiety 10/24/2020   Heart failure (HCC) 10/24/2020   History of sepsis 10/24/2020   Mixed hyperlipidemia 10/24/2020   Neck  pain 10/24/2020   Osteopenia 10/24/2020   Overactive bladder 10/24/2020   Polyp of colon 10/24/2020   ESBL (extended spectrum beta-lactamase) producing bacteria infection 05/12/2020   Bacteremia due to Escherichia coli 05/12/2020   Severe sepsis (HCC) 05/12/2020   Debility 05/05/2020   Pressure injury of skin 04/24/2020   Palliative care by specialist    Pyelonephritis of left kidney 04/14/2020   Renal abscess, left 04/14/2020   Leukocytosis 04/14/2020   Fever 04/14/2020   LLQ abdominal pain 04/14/2020   AKI (acute kidney injury) (HCC)    Sepsis secondary to UTI (HCC)    Pain in left ankle and joints of left foot 12/02/2019   History of epilepsy 08/04/2019   Positive colorectal cancer screening using Cologuard test 10/28/2018   Posterior tibial tendinitis, right leg 04/23/2016   Pain in right foot 04/12/2016   Acute encephalopathy    Myoclonus    Frequent falls 11/09/2015   Fall 11/09/2015   Chest pain 03/16/2014   Upper abdominal pain 03/16/2014   Seizure disorder (HCC) 03/16/2014   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   Generalized convulsive epilepsy (HCC) 10/06/2012   DNR (do not resuscitate) discussion 10/06/2012   Intellectual disability    Thrombocytopenia (HCC) 05/23/2011   Seizures (HCC) 03/21/2011   GERD (gastroesophageal reflux disease) 03/21/2011   CLOSED FRACTURE OF  ACROMIAL END OF CLAVICLE 07/13/2008    Past Surgical History:  Procedure Laterality Date   BIOPSY  09/16/2014   Procedure: BIOPSY;  Surgeon: Malissa Hippo, MD;  Location: [facility];  Service: Endoscopy;;   CATARACT EXTRACTION     both eyes, May of 2015   COLONOSCOPY     COLONOSCOPY WITH PROPOFOL N/A 11/20/2018   Procedure: COLONOSCOPY WITH PROPOFOL;  Surgeon: Malissa Hippo, MD;  Location: [facility];  Service: Endoscopy;  Laterality: N/A;   COLONOSCOPY WITH PROPOFOL N/A 08/07/2022   Procedure: COLONOSCOPY WITH PROPOFOL;  Surgeon: Dolores Frame, MD;  Location: [facility];   Service: Gastroenterology;  Laterality: N/A;  7:30am, asa 1-2   ESOPHAGEAL DILATION N/A 09/16/2014   Procedure: ESOPHAGEAL DILATION WITH 54FR MALONEY DILATOR;  Surgeon: Malissa Hippo, MD;  Location: [facility];  Service: Endoscopy;  Laterality: N/A;   ESOPHAGOGASTRODUODENOSCOPY (EGD) WITH PROPOFOL N/A 09/16/2014   Procedure: ESOPHAGOGASTRODUODENOSCOPY (EGD) WITH PROPOFOL;  Surgeon: Malissa Hippo, MD;  Location: [facility];  Service: Endoscopy;  Laterality: N/A;   HEMOSTASIS CLIP PLACEMENT  08/07/2022   Procedure: HEMOSTASIS CLIP PLACEMENT;  Surgeon: Dolores Frame, MD;  Location: [facility];  Service: Gastroenterology;;   LAPAROSCOPIC APPENDECTOMY N/A 07/14/2020   Procedure: APPENDECTOMY LAPAROSCOPIC;  Surgeon: Lucretia Roers, MD;  Location: [facility];  Service: General;  Laterality: N/A;   MOUTH SURGERY     POLYPECTOMY  11/20/2018   Procedure: POLYPECTOMY;  Surgeon: Malissa Hippo, MD;  Location: [facility];  Service: Endoscopy;;  colon   POLYPECTOMY  08/07/2022   Procedure: POLYPECTOMY;  Surgeon: Dolores Frame, MD;  Location: [facility];  Service: Gastroenterology;;   Skin graft to gum Right 08/2013   SUBMUCOSAL LIFTING INJECTION  08/07/2022   Procedure: SUBMUCOSAL LIFTING INJECTION;  Surgeon: Dolores Frame, MD;  Location: [facility];  Service: Gastroenterology;;   TONSILLECTOMY AND ADENOIDECTOMY     TOTAL ABDOMINAL HYSTERECTOMY      OB History   No obstetric history on file.      Home Medications    Prior to Admission medications   Medication Sig Start Date End Date Taking? Authorizing Provider  ibuprofen (ADVIL) 800 MG tablet Take 1 tablet (800 mg total) by mouth every 8 (eight) hours as needed. Take with food to prevent GI upset 11/12/22  Yes Cathlean Marseilles A, NP  tiZANidine (ZANAFLEX) 4 MG tablet Take 1 tablet (4 mg total) by mouth every 8 (eight) hours as needed for muscle spasms. Do not take with alcohol or while driving or operating heavy  machinery.  May cause drowsiness. 11/12/22  Yes Cathlean Marseilles A, NP  atorvastatin (LIPITOR) 20 MG tablet Take 20 mg by mouth daily. 08/24/18   [provider]  Calcium Carb-Cholecalciferol (CALCIUM/VITAMIN D PO) Take 1 tablet by mouth 2 (two) times daily.    [provider]  calcium carbonate (TUMS - DOSED IN MG ELEMENTAL CALCIUM) 500 MG chewable tablet Chew 1 tablet by mouth as needed for indigestion or heartburn.    [provider]  clonazePAM (KLONOPIN) 0.5 MG tablet Take 1 tablet (0.5 mg total) by mouth 2 (two) times daily. 09/23/22   Glean Salvo, NP  divalproex (DEPAKOTE) 500 MG DR tablet TAKE 2 TABLETS (1,000 MG TOTAL) BY MOUTH DAILY. 07/10/22 07/10/23  Glean Salvo, NP  docusate sodium (COLACE) 100 MG capsule Take 200 mg by mouth at bedtime.    [provider]  lacosamide (VIMPAT) 200 MG  TABS tablet Take 1 tablet (200 mg total) by mouth 2 (two) times daily. 08/08/22   Glean Salvo, NP  Multiple Vitamin (MULTIVITAMIN) capsule Take 1 capsule by mouth daily.    [provider]  omeprazole (PRILOSEC) 40 MG capsule Take 1 capsule (40 mg total) by mouth daily. 08/27/22   Dolores Frame, MD  oxybutynin (DITROPAN) 5 MG tablet TAKE 1 TABLET AT BEDTIME 11/27/21   Marcine Matar, MD  VIMPAT 200 MG TABS tablet Take 1 tablet (200 mg total) by mouth 2 (two) times daily. 02/22/21   Sater, Pearletha Furl, MD  QUEtiapine (SEROQUEL) 25 MG tablet Take 1 tablet (25 mg total) by mouth 2 (two) times daily. 05/31/20 06/06/20  Angiulli, Mcarthur Rossetti, PA-C    Family History Family History  Problem Relation Age of Onset   High Cholesterol Mother    High blood pressure Mother    Diabetes Father     Social History Social History   Tobacco Use   Smoking status: Former    Packs/day: 1.50    Years: 15.00    Additional pack years: 0.00    Total pack years: 22.50    Types: Cigarettes    Quit date: 05/22/2006    Years since quitting: 16.4   Smokeless tobacco:  Never  Vaping Use   Vaping Use: Never used  Substance Use Topics   Alcohol use: No    Alcohol/week: 0.0 standard drinks of alcohol   Drug use: No     Allergies   Codeine and Lamictal [lamotrigine]   Review of Systems Review of Systems Per HPI  Physical Exam Triage Vital Signs ED Triage Vitals  Enc Vitals Group     BP 11/12/22 1324 123/82     Pulse Rate 11/12/22 1324 68     Resp 11/12/22 1324 20     Temp 11/12/22 1324 98.3 F (36.8 C)     Temp Source 11/12/22 1324 Oral     SpO2 11/12/22 1324 98 %     Weight --      Height --      Head Circumference --      Peak Flow --      Pain Score 11/12/22 1323 9     Pain Loc --      Pain Edu? --      Excl. in GC? --    No data found.  Updated Vital Signs BP 123/82 (BP Location: Right Arm)   Pulse 68   Temp 98.3 F (36.8 C) (Oral)   Resp 20   SpO2 98%   Visual Acuity Right Eye Distance:   Left Eye Distance:   Bilateral Distance:    Right Eye Near:   Left Eye Near:    Bilateral Near:     Physical Exam Vitals and nursing note reviewed.  Constitutional:      General: She is not in acute distress.    Appearance: Normal appearance. She is not toxic-appearing.  HENT:     Mouth/Throat:     Mouth: Mucous membranes are moist.     Pharynx: Oropharynx is clear.  Pulmonary:     Effort: Pulmonary effort is normal. No respiratory distress.     Breath sounds: Normal breath sounds. No wheezing, rhonchi or rales.     Comments: No bruising, point tenderness to rib cage on the right side.  No swelling or obvious deformity. Musculoskeletal:     Right lower leg: No edema.     Left lower leg: No  edema.     Comments: No bruising or swelling to paraspinal muscles, lumbar or thoracic vertebrae.  No bruising, redness, or obvious deformity.  Lower extremities appear to be moving equally and without difficulty.  Normal strength and sensation, lower extremities are neurovascularly intact bilaterally.  Skin:    General: Skin is warm  and dry.     Capillary Refill: Capillary refill takes less than 2 seconds.     Coloration: Skin is not jaundiced or pale.     Findings: No erythema.  Neurological:     Mental Status: She is alert and oriented to person, place, and time.  Psychiatric:        Behavior: Behavior is cooperative.      UC Treatments / Results  Labs (all labs ordered are listed, but only abnormal results are displayed) Labs Reviewed - No data to display  EKG   Radiology No results found.  Procedures Procedures (including critical care time)  Medications Ordered in UC Medications - No data to display  Initial Impression / Assessment and Plan / UC Course  I have reviewed the triage vital signs and the nursing notes.  Pertinent labs & imaging results that were available during my care of the patient were reviewed by me and considered in my medical decision making (see chart for details).   Patient is well-appearing, normotensive, afebrile, not tachycardic, not tachypneic, oxygenating well on room air.    1. Acute right-sided back pain, unspecified back location X-ray imaging deferred given pain seems to be moving around and no obvious deformity, bruising, swelling, or obvious point of injury Start ibuprofen every 8 hours as needed, alternate with Tylenol 500 to 1000 mg every 6 hours Start ice/heat as well as muscle relaxant Follow-up with PCP for persistent/worsening symptoms despite treatment later this week  The patient was given the opportunity to ask questions.  All questions answered to their satisfaction.  The patient is in agreement to this plan.    Final Clinical Impressions(s) / UC Diagnoses   Final diagnoses:  Acute right-sided back pain, unspecified back location     Discharge Instructions      Take the ibuprofen every 8 hours as needed for pain; be sure to take this with food.  Alternate with Tylenol [phone] mg every 6 hours.  Start ice/heat to the painful areas.   Start  tizanidine every 8 hours as needed for muscular pain additionally.  Be careful as this may make you a bit drowsy.   Follow up with PCP later this week with no improvement or worsening of symptoms despite treatment.    ED Prescriptions     Medication Sig Dispense Auth. Provider   ibuprofen (ADVIL) 800 MG tablet Take 1 tablet (800 mg total) by mouth every 8 (eight) hours as needed. Take with food to prevent GI upset 21 tablet Cathlean Marseilles A, NP   tiZANidine (ZANAFLEX) 4 MG tablet Take 1 tablet (4 mg total) by mouth every 8 (eight) hours as needed for muscle spasms. Do not take with alcohol or while driving or operating heavy machinery.  May cause drowsiness. 30 tablet Valentino Nose, NP      PDMP not reviewed this encounter.   Valentino Nose, NP 11/12/22 1525

## 2022-11-12 NOTE — ED Triage Notes (Signed)
Pt reports right lower back pain since fall last night. Pt reports intermittent radiation of pain to RLE. Unable to recall how fell, unwitnessed fall.

## 2022-11-12 NOTE — Telephone Encounter (Signed)
I do not have an appointment scheduled with patient today.

## 2022-11-12 NOTE — Telephone Encounter (Signed)
If patient is doing well with her seizures, we can push her appointment out if she needs her urgent care needs addressed sooner. Thanks

## 2022-11-14 LAB — COMPREHENSIVE METABOLIC PANEL
ALT: 24 IU/L (ref 0–32)
AST: 20 IU/L (ref 0–40)
Albumin/Globulin Ratio: 2
Albumin: 4.3 g/dL (ref 3.8–4.9)
Alkaline Phosphatase: 105 IU/L (ref 44–121)
BUN/Creatinine Ratio: 30 — ABNORMAL HIGH (ref 12–28)
BUN: 25 mg/dL (ref 8–27)
Bilirubin Total: 0.3 mg/dL (ref 0.0–1.2)
CO2: 23 mmol/L (ref 20–29)
Calcium: 9.5 mg/dL (ref 8.7–10.3)
Chloride: 104 mmol/L (ref 96–106)
Creatinine, Ser: 0.82 mg/dL (ref 0.57–1.00)
Globulin, Total: 2.1 g/dL (ref 1.5–4.5)
Glucose: 111 mg/dL — ABNORMAL HIGH (ref 70–99)
Potassium: 3.7 mmol/L (ref 3.5–5.2)
Sodium: 142 mmol/L (ref 134–144)
Total Protein: 6.4 g/dL (ref 6.0–8.5)
eGFR: 82 mL/min/{1.73_m2} (ref >59)

## 2022-11-14 LAB — CBC WITH DIFFERENTIAL/PLATELET
Basophils Absolute: 0 10*3/uL (ref 0.0–0.2)
Basos: 1 %
EOS (ABSOLUTE): 0.2 10*3/uL (ref 0.0–0.4)
Eos: 3 %
Hematocrit: 37 % (ref 34.0–46.6)
Hemoglobin: 12.5 g/dL (ref 11.1–15.9)
Immature Grans (Abs): 0 10*3/uL (ref 0.0–0.1)
Immature Granulocytes: 1 %
Lymphocytes Absolute: 2.8 10*3/uL (ref 0.7–3.1)
Lymphs: 43 %
MCH: 31.1 pg (ref 26.6–33.0)
MCHC: 33.8 g/dL (ref 31.5–35.7)
MCV: 92 fL (ref 79–97)
Monocytes Absolute: 0.6 10*3/uL (ref 0.1–0.9)
Monocytes: 8 %
Neutrophils Absolute: 2.9 10*3/uL (ref 1.4–7.0)
Neutrophils: 44 %
Platelets: 158 10*3/uL (ref 150–450)
RBC: 4.02 x10E6/uL (ref 3.77–5.28)
RDW: 13.6 % (ref 11.7–15.4)
WBC: 6.5 10*3/uL (ref 3.4–10.8)

## 2022-11-14 LAB — VALPROIC ACID LEVEL: Valproic Acid Lvl: 105 ug/mL — ABNORMAL HIGH (ref 50–100)

## 2022-11-14 LAB — LACOSAMIDE: Lacosamide: 18.8 ug/mL — ABNORMAL HIGH (ref 5.0–10.0)

## 2022-12-16 DIAGNOSIS — K219 Gastro-esophageal reflux disease without esophagitis: Secondary | ICD-10-CM | POA: Diagnosis not present

## 2022-12-16 DIAGNOSIS — E785 Hyperlipidemia, unspecified: Secondary | ICD-10-CM | POA: Diagnosis not present

## 2022-12-16 DIAGNOSIS — G40909 Epilepsy, unspecified, not intractable, without status epilepticus: Secondary | ICD-10-CM | POA: Diagnosis not present

## 2022-12-16 DIAGNOSIS — N3281 Overactive bladder: Secondary | ICD-10-CM | POA: Diagnosis not present

## 2022-12-16 DIAGNOSIS — R944 Abnormal results of kidney function studies: Secondary | ICD-10-CM | POA: Diagnosis not present

## 2022-12-16 DIAGNOSIS — R7301 Impaired fasting glucose: Secondary | ICD-10-CM | POA: Diagnosis not present

## 2022-12-16 DIAGNOSIS — M8580 Other specified disorders of bone density and structure, unspecified site: Secondary | ICD-10-CM | POA: Diagnosis not present

## 2022-12-16 DIAGNOSIS — Z6827 Body mass index (BMI) 27.0-27.9, adult: Secondary | ICD-10-CM | POA: Diagnosis not present

## 2022-12-16 DIAGNOSIS — Z79899 Other long term (current) drug therapy: Secondary | ICD-10-CM | POA: Diagnosis not present

## 2022-12-16 DIAGNOSIS — F419 Anxiety disorder, unspecified: Secondary | ICD-10-CM | POA: Diagnosis not present

## 2022-12-16 DIAGNOSIS — Z713 Dietary counseling and surveillance: Secondary | ICD-10-CM | POA: Diagnosis not present

## 2022-12-16 DIAGNOSIS — D696 Thrombocytopenia, unspecified: Secondary | ICD-10-CM | POA: Diagnosis not present

## 2022-12-27 ENCOUNTER — Ambulatory Visit (INDEPENDENT_AMBULATORY_CARE_PROVIDER_SITE_OTHER): Payer: Medicare Other

## 2022-12-27 ENCOUNTER — Ambulatory Visit: Payer: Self-pay

## 2022-12-27 ENCOUNTER — Ambulatory Visit
Admission: EM | Admit: 2022-12-27 | Discharge: 2022-12-27 | Disposition: A | Discharge status: Home / Self Care | Payer: TRICARE For Life (TFL) | Attending: Family Medicine | Admitting: Family Medicine

## 2022-12-27 DIAGNOSIS — M79672 Pain in left foot: Secondary | ICD-10-CM

## 2022-12-27 MED ORDER — IBUPROFEN 800 MG PO TABS: 800.0000 mg | ORAL_TABLET | Freq: Three times a day (TID) | ORAL | 0 refills | Status: DC | PRN

## 2022-12-27 NOTE — ED Triage Notes (Signed)
Pt reports report left foot pain with standing  x 3-4 days.    Pt believes it started hurting x 1 month ago. Unsure of injury.

## 2022-12-27 NOTE — Discharge Instructions (Addendum)
Try wearing supportive shoes, icing and elevating the foot and taking ibuprofen and Tylenol as needed.  Follow-up with podiatry, such as [facility], if not resolving  [facility] Address: [address] [place], [place], [place] [address] Phone: [phone]

## 2022-12-27 NOTE — ED Provider Notes (Signed)
[facility]    CSN: [id number] Arrival date & time: 12/27/22  1338      History   Chief Complaint No chief complaint on file.   HPI Maria [name] is a 62 y.o. female.   Presenting today with about a month of left foot pain, worse the last 3 to 4 days.  States the pain is on the left lateral foot.  Denies any known injury.  Pain worse with weightbearing.  Denies numbness, tingling, loss of range of motion.  Tried some ibuprofen with mild relief.    Past Medical History:  Diagnosis Date   Constipation    Dysphagia    Fracture    R foot   GERD (gastroesophageal reflux disease)    History of shingles 10/2016   Mental retardation    lesion in head   Seizures (HCC)    "not fully controlled on max doses of meds" (Neuro ofc note 12/2014)   Thrombocytopenia (HCC)    Tremor     Patient Active Problem List   Diagnosis Date Noted   History of colonic polyps 08/07/2022   Pain in right shoulder 08/24/2021   Clavicle pain 08/15/2021   Acute urinary tract infection 03/13/2021   Dysuria 03/13/2021   COVID-19 02/19/2021   Allergic rhinitis 10/24/2020   Anemia 10/24/2020   Anxiety 10/24/2020   Heart failure (HCC) 10/24/2020   History of sepsis 10/24/2020   Mixed hyperlipidemia 10/24/2020   Neck pain 10/24/2020   Osteopenia 10/24/2020   Overactive bladder 10/24/2020   Polyp of colon 10/24/2020   ESBL (extended spectrum beta-lactamase) producing bacteria infection 05/12/2020   Bacteremia due to Escherichia coli 05/12/2020   Severe sepsis (HCC) 05/12/2020   Debility 05/05/2020   Pressure injury of skin 04/24/2020   Palliative care by specialist    Pyelonephritis of left kidney 04/14/2020   Renal abscess, left 04/14/2020   Leukocytosis 04/14/2020   Fever 04/14/2020   LLQ abdominal pain 04/14/2020   AKI (acute kidney injury) (HCC)    Sepsis secondary to UTI (HCC)    Pain in left ankle and joints of left foot 12/02/2019   History of epilepsy 08/04/2019    Positive colorectal cancer screening using Cologuard test 10/28/2018   Posterior tibial tendinitis, right leg 04/23/2016   Pain in right foot 04/12/2016   Acute encephalopathy    Myoclonus    Frequent falls 11/09/2015   Fall 11/09/2015   Chest pain 03/16/2014   Upper abdominal pain 03/16/2014   Seizure disorder (HCC) 03/16/2014   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   Generalized convulsive epilepsy (HCC) 10/06/2012   DNR (do not resuscitate) discussion 10/06/2012   Intellectual disability    Thrombocytopenia (HCC) 05/23/2011   Seizures (HCC) 03/21/2011   GERD (gastroesophageal reflux disease) 03/21/2011   CLOSED FRACTURE OF ACROMIAL END OF CLAVICLE 07/13/2008    Past Surgical History:  Procedure Laterality Date   BIOPSY  09/16/2014   Procedure: BIOPSY;  Surgeon: Malissa Hippo, MD;  Location: [facility];  Service: Endoscopy;;   CATARACT EXTRACTION     both eyes, May of 2015   COLONOSCOPY     COLONOSCOPY WITH PROPOFOL N/A 11/20/2018   Procedure: COLONOSCOPY WITH PROPOFOL;  Surgeon: Malissa Hippo, MD;  [facility];  Service: Endoscopy;  Laterality: N/A;   COLONOSCOPY WITH PROPOFOL N/A 08/07/2022   Procedure: COLONOSCOPY WITH PROPOFOL;  Surgeon: Dolores Frame, MD;  [facility];  Service: Gastroenterology;  Laterality: N/A;  7:30am, asa  1-2   ESOPHAGEAL DILATION N/A 09/16/2014   Procedure: ESOPHAGEAL DILATION WITH 54FR MALONEY DILATOR;  Surgeon: Malissa Hippo, MD;  Location: [facility];  Service: Endoscopy;  Laterality: N/A;   ESOPHAGOGASTRODUODENOSCOPY (EGD) WITH PROPOFOL N/A 09/16/2014   Procedure: ESOPHAGOGASTRODUODENOSCOPY (EGD) WITH PROPOFOL;  Surgeon: Malissa Hippo, MD;  Location: [facility];  Service: Endoscopy;  Laterality: N/A;   HEMOSTASIS CLIP PLACEMENT  08/07/2022   Procedure: HEMOSTASIS CLIP PLACEMENT;  Surgeon: Dolores Frame, MD;  [facility];  Service: Gastroenterology;;   LAPAROSCOPIC APPENDECTOMY N/A 07/14/2020    Procedure: APPENDECTOMY LAPAROSCOPIC;  Surgeon: Lucretia Roers, MD;  Location: [facility];  Service: General;  Laterality: N/A;   MOUTH SURGERY     POLYPECTOMY  11/20/2018   Procedure: POLYPECTOMY;  Surgeon: Malissa Hippo, MD;  [facility];  Service: Endoscopy;;  colon   POLYPECTOMY  08/07/2022   Procedure: POLYPECTOMY;  Surgeon: Dolores Frame, MD;  [facility];  Service: Gastroenterology;;   Skin graft to gum Right 08/2013   SUBMUCOSAL LIFTING INJECTION  08/07/2022   Procedure: SUBMUCOSAL LIFTING INJECTION;  Surgeon: Dolores Frame, MD;  [facility];  Service: Gastroenterology;;   TONSILLECTOMY AND ADENOIDECTOMY     TOTAL ABDOMINAL HYSTERECTOMY      OB History   No obstetric history on file.      Home Medications    Prior to Admission medications   Medication Sig Start Date End Date Taking? Authorizing Provider  ibuprofen (ADVIL) 800 MG tablet Take 1 tablet (800 mg total) by mouth every 8 (eight) hours as needed. 12/27/22  Yes Particia Nearing, PA-C  atorvastatin (LIPITOR) 20 MG tablet Take 20 mg by mouth daily. 08/24/18   [provider]  Calcium Carb-Cholecalciferol (CALCIUM/VITAMIN D PO) Take 1 tablet by mouth 2 (two) times daily.    [provider]  calcium carbonate (TUMS - DOSED IN MG ELEMENTAL CALCIUM) 500 MG chewable tablet Chew 1 tablet by mouth as needed for indigestion or heartburn.    [provider]  clonazePAM (KLONOPIN) 0.5 MG tablet Take 1 tablet (0.5 mg total) by mouth 2 (two) times daily. 09/23/22   Glean Salvo, NP  divalproex (DEPAKOTE) 500 MG DR tablet TAKE 2 TABLETS (1,000 MG TOTAL) BY MOUTH DAILY. 07/10/22 07/10/23  Glean Salvo, NP  docusate sodium (COLACE) 100 MG capsule Take 200 mg by mouth at bedtime.    [provider]  ibuprofen (ADVIL) 800 MG tablet Take 1 tablet (800 mg total) by mouth every 8 (eight) hours as needed. Take with food to prevent GI upset 11/12/22    Valentino Nose, NP  lacosamide (VIMPAT) 200 MG TABS tablet Take 1 tablet (200 mg total) by mouth 2 (two) times daily. 08/08/22   Glean Salvo, NP  Multiple Vitamin (MULTIVITAMIN) capsule Take 1 capsule by mouth daily.    [provider]  omeprazole (PRILOSEC) 40 MG capsule Take 1 capsule (40 mg total) by mouth daily. 08/27/22   Dolores Frame, MD  oxybutynin (DITROPAN) 5 MG tablet TAKE 1 TABLET AT BEDTIME 11/27/21   Marcine Matar, MD  tiZANidine (ZANAFLEX) 4 MG tablet Take 1 tablet (4 mg total) by mouth every 8 (eight) hours as needed for muscle spasms. Do not take with alcohol or while driving or operating heavy machinery.  May cause drowsiness. 11/12/22   Valentino Nose, NP  VIMPAT 200 MG TABS tablet Take 1 tablet (200 mg total) by mouth  2 (two) times daily. 02/22/21   Sater, Pearletha Furl, MD  QUEtiapine (SEROQUEL) 25 MG tablet Take 1 tablet (25 mg total) by mouth 2 (two) times daily. 05/31/20 06/06/20  Angiulli, Mcarthur Rossetti, PA-C    Family History Family History  Problem Relation Age of Onset   High Cholesterol Mother    High blood pressure Mother    Diabetes Father     Social History Social History   Tobacco Use   Smoking status: Former    Current packs/day: 0.00    Average packs/day: 1.5 packs/day for 15.0 years (22.5 ttl pk-yrs)    Types: Cigarettes    Start date: 05/23/1991    Quit date: 05/22/2006    Years since quitting: 16.6   Smokeless tobacco: Never  Vaping Use   Vaping status: Never Used  Substance Use Topics   Alcohol use: No    Alcohol/week: 0.0 standard drinks of alcohol   Drug use: No     Allergies   Codeine and Lamictal [lamotrigine]   Review of Systems Review of Systems PER HPI  Physical Exam Triage Vital Signs ED Triage Vitals  Encounter Vitals Group     BP 12/27/22 1344 (!) 149/70     Systolic BP Percentile --      Diastolic BP Percentile --      Pulse Rate 12/27/22 1344 77     Resp 12/27/22 1342 20     Temp 12/27/22  1342 97.7 F (36.5 C)     Temp Source 12/27/22 1342 Oral     SpO2 12/27/22 1342 97 %     Weight --      Height --      Head Circumference --      Peak Flow --      Pain Score 12/27/22 1515 6     Pain Loc --      Pain Education --      Exclude from Growth Chart --    No data found.  Updated Vital Signs BP (!) 149/70 (BP Location: Right Arm)   Pulse 77   Temp 97.7 F (36.5 C) (Oral)   Resp 20   SpO2 97%   Visual Acuity Right Eye Distance:   Left Eye Distance:   Bilateral Distance:    Right Eye Near:   Left Eye Near:    Bilateral Near:     Physical Exam Vitals and nursing note reviewed.  Constitutional:      Appearance: Normal appearance. She is not ill-appearing.  HENT:     Head: Atraumatic.  Eyes:     Extraocular Movements: Extraocular movements intact.     Conjunctiva/sclera: Conjunctivae normal.  Cardiovascular:     Rate and Rhythm: Normal rate and regular rhythm.     Heart sounds: Normal heart sounds.  Pulmonary:     Effort: Pulmonary effort is normal.     Breath sounds: Normal breath sounds.  Musculoskeletal:        General: Tenderness present. No swelling, deformity or signs of injury. Normal range of motion.     Cervical back: Normal range of motion and neck supple.     Comments: Mild tenderness to palpation to the left lateral foot.  Area of hyperpigmentation where the pain is to the lateral midfoot  Skin:    General: Skin is warm and dry.  Neurological:     Mental Status: She is alert. Mental status is at baseline.     Comments: Bilateral lower extremities neurovascularly intact  Psychiatric:  Mood and Affect: Mood normal.        Thought Content: Thought content normal.        Judgment: Judgment normal.      UC Treatments / Results  Labs (all labs ordered are listed, but only abnormal results are displayed) Labs Reviewed - No data to display  EKG   Radiology DG Foot Complete Left  Result Date: 12/27/2022 CLINICAL DATA:  Left  lateral foot pain. EXAM: LEFT FOOT - COMPLETE 3+ VIEW COMPARISON:  None Available. FINDINGS: There is no evidence of fracture or dislocation. There is no evidence of arthropathy or other focal bone abnormality. Soft tissues are unremarkable. IMPRESSION: Negative. Electronically Signed   By: Irish Lack M.D.   On: 12/27/2022 15:02    Procedures Procedures (including critical care time)  Medications Ordered in UC Medications - No data to display  Initial Impression / Assessment and Plan / UC Course  I have reviewed the triage vital signs and the nursing notes.  Pertinent labs & imaging results that were available during my care of the patient were reviewed by me and considered in my medical decision making (see chart for details).     X-ray of the foot negative for acute bony abnormality, suspect related to wearing foot flops frequently, possibly some pressure points and lack of support from the footwear.  Discussed RICE protocol, supportive shoes, over-the-counter pain relievers and podiatry follow-up if not resolving.  Final Clinical Impressions(s) / UC Diagnoses   Final diagnoses:  Left foot pain     Discharge Instructions      Try wearing supportive shoes, icing and elevating the foot and taking ibuprofen and Tylenol as needed.  Follow-up with podiatry, such as [facility], if not resolving  [facility] Address: [address] [place], [place], [place] [address] Phone: [phone]    ED Prescriptions     Medication Sig Dispense Auth. Provider   ibuprofen (ADVIL) 800 MG tablet Take 1 tablet (800 mg total) by mouth every 8 (eight) hours as needed. 21 tablet Particia Nearing, [place]      PDMP not reviewed this encounter.   Particia Nearing, [place] 12/27/22 [phone]

## 2023-01-07 ENCOUNTER — Other Ambulatory Visit: Payer: Self-pay | Admitting: Urology

## 2023-01-07 DIAGNOSIS — R351 Nocturia: Secondary | ICD-10-CM

## 2023-01-20 ENCOUNTER — Telehealth: Payer: Self-pay | Admitting: Neurology

## 2023-01-20 ENCOUNTER — Other Ambulatory Visit: Payer: Self-pay | Admitting: Neurology

## 2023-01-20 DIAGNOSIS — R569 Unspecified convulsions: Secondary | ICD-10-CM

## 2023-01-20 MED ORDER — CLONAZEPAM 0.5 MG PO TABS: 0.5000 mg | ORAL_TABLET | Freq: Two times a day (BID) | ORAL | 0 refills | Status: DC

## 2023-01-20 NOTE — Telephone Encounter (Signed)
Requested Prescriptions   Pending Prescriptions Disp Refills   clonazePAM (KLONOPIN) 0.5 MG tablet 180 tablet 1    Sig: Take 1 tablet (0.5 mg total) by mouth 2 (two) times daily.   Last seen 11/12/22, next appt 05/26/23  Dispenses   Dispensed Days Supply Quantity Provider Pharmacy  CLONAZEPAM 0.5 MG TABLET 09/23/2022 90 180 tablet Glean Salvo, NP [facility]  CLONAZEPAM 0.5 MG TABLET 06/22/2022 90 180 tablet Windell Norfolk, MD [facility]  CLONAZEPAM 0.5 MG TABLET 04/10/2022 90 180 tablet Windell Norfolk, MD [facility].Marland KitchenMarland Kitchen

## 2023-01-20 NOTE — Addendum Note (Signed)
Addended by: Glean Salvo on: 01/20/2023 04:50 PM   Modules accepted: Orders

## 2023-01-20 NOTE — Telephone Encounter (Signed)
Short term supply of Klonopin sent to local pharmacy for 1 week, mail order supply is running late. Thanks  Meds ordered this encounter  Medications   clonazePAM (KLONOPIN) 0.5 MG tablet    Sig: Take 1 tablet (0.5 mg total) by mouth 2 (two) times daily.    Dispense:  14 tablet    Refill:  0    Fill this at local pharmacy, mail order is delayed

## 2023-01-20 NOTE — Telephone Encounter (Signed)
Pt's son, Rickard Rhymes request refill for clonazePAM (KLONOPIN) 0.5 MG tablet. Due to out of medication sent to [facility] [phone]

## 2023-01-20 NOTE — Telephone Encounter (Signed)
Phone room let her know what Maria [name] stated: Short term supply of Klonopin sent to local pharmacy for 1 week, mail order supply is running late. Thanks  [employer]

## 2023-01-20 NOTE — Addendum Note (Signed)
Addended by: Danne Harbor on: 01/20/2023 04:47 PM   Modules accepted: Orders

## 2023-01-21 NOTE — Telephone Encounter (Signed)
Dispensed Days Supply Quantity Provider Pharmacy  LACOSAMIDE 200 MG TABLET 10/27/2022 90 180 tablet Glean Salvo, NP [facility]  LACOSAMIDE 200 MG TABLET 08/08/2022 90 180 tablet Glean Salvo, NP [facility]  LACOSAMIDE 200 MG TABLET 05/04/2022 90 180 tablet Sater, Pearletha Furl, MD [facility]  LACOSAMIDE 200 MG TABLET 02/11/2022 90 180 tablet Sater, Pearletha Furl, MD [facility]...    Last visit 11/12/22 Next visit 06/10/23

## 2023-01-25 ENCOUNTER — Ambulatory Visit
Admission: EM | Admit: 2023-01-25 | Discharge: 2023-01-25 | Discharge status: Against Medical Advice | Payer: Medicare Other

## 2023-01-25 NOTE — ED Notes (Signed)
Pt family inquiring about refill of Klonopin. PA reported px written for quantity of 14 by Neurology and PDMP filled on 8/19. Contacted pharmacy and verified pick up date. Pt family reported "well she doesn't have it" and remained agitated as left UC.   Pt and pt family left UC prior to provider assessment.

## 2023-01-25 NOTE — ED Provider Notes (Signed)
Patient presenting today with a caregiver/family member who has requested on her behalf a refill of her clonazepam.  He states the mail order was delayed and she only has 1 pill left of her clonazepam.  Per chart review and PDMP, prescribing provider sent over a 7-day supply to [facility] [address] in [place] on 1/61/0960 due to the delay in the mail order.  PDMP reviewed and this was picked up on 01/20/2023.  [facility] was also called personally and corroborated this that the prescription was picked up on 01/20/2023.  Patient's caregiver/family member became very angry, stating multiple times that she did not in fact have the medication and "I guess she will just have a seizure then".   Particia Nearing, [place] 01/25/23 [phone]

## 2023-01-25 NOTE — ED Triage Notes (Signed)
Pt presents to UC for a refill of clonazepam  mg as her delivery order hasn't gotten delivered yet.   Date was supposed to be today

## 2023-01-28 NOTE — Telephone Encounter (Signed)
Error

## 2023-01-29 ENCOUNTER — Telehealth: Payer: Self-pay

## 2023-01-29 NOTE — Telephone Encounter (Signed)
Return call to patient's mom. Mom states that patient needs her oxybutynin refilled. Patient's  mom is made aware patient will need a follow up appointment being that she has not been seen in 2 years. Patient/mom is made aware that patient need to keep appointment for medication refill. Voiced understanding

## 2023-01-29 NOTE — Telephone Encounter (Signed)
Patient left a voice message.  Needing medication refilled that will help with frequent urination.  Please advise as soon as possible.

## 2023-02-17 ENCOUNTER — Ambulatory Visit (INDEPENDENT_AMBULATORY_CARE_PROVIDER_SITE_OTHER): Payer: Medicare Other | Admitting: Urology

## 2023-02-17 ENCOUNTER — Encounter: Payer: Self-pay | Admitting: Urology

## 2023-02-17 VITALS — BP 111/68 | HR 94

## 2023-02-17 DIAGNOSIS — R350 Frequency of micturition: Secondary | ICD-10-CM

## 2023-02-17 DIAGNOSIS — N3281 Overactive bladder: Secondary | ICD-10-CM | POA: Diagnosis not present

## 2023-02-17 DIAGNOSIS — R351 Nocturia: Secondary | ICD-10-CM

## 2023-02-17 LAB — BLADDER SCAN AMB NON-IMAGING: Scan Result: 98

## 2023-02-17 NOTE — Progress Notes (Signed)
History of Present Illness: Maria [name] is a 62 y.o. year old female here for follow-up of nocturia, frequency and urgency.  She is on oxybutynin 5 mg at night.  She is having significant constipation, often necessitating enemas.  She does not complain of leaking day or night.  She apparently does not wear pull-ups.  That her brother, who attends here today, is unaware of any urinary infections or other issues recently.  Past Medical History:  Diagnosis Date   Constipation    Dysphagia    Fracture    R foot   GERD (gastroesophageal reflux disease)    History of shingles 10/2016   Mental retardation    lesion in head   Seizures (HCC)    "not fully controlled on max doses of meds" (Neuro ofc note 12/2014)   Thrombocytopenia (HCC)    Tremor     Past Surgical History:  Procedure Laterality Date   BIOPSY  09/16/2014   Procedure: BIOPSY;  Surgeon: Malissa Hippo, MD;  Location: [facility];  Service: Endoscopy;;   CATARACT EXTRACTION     both eyes, May of 2015   COLONOSCOPY     COLONOSCOPY WITH PROPOFOL N/A 11/20/2018   Procedure: COLONOSCOPY WITH PROPOFOL;  Surgeon: Malissa Hippo, MD;  [facility];  Service: Endoscopy;  Laterality: N/A;   COLONOSCOPY WITH PROPOFOL N/A 08/07/2022   Procedure: COLONOSCOPY WITH PROPOFOL;  Surgeon: Dolores Frame, MD;  [facility];  Service: Gastroenterology;  Laterality: N/A;  7:30am, asa 1-2   ESOPHAGEAL DILATION N/A 09/16/2014   Procedure: ESOPHAGEAL DILATION WITH 54FR MALONEY DILATOR;  Surgeon: Malissa Hippo, MD;  [facility];  Service: Endoscopy;  Laterality: N/A;   ESOPHAGOGASTRODUODENOSCOPY (EGD) WITH PROPOFOL N/A 09/16/2014   Procedure: ESOPHAGOGASTRODUODENOSCOPY (EGD) WITH PROPOFOL;  Surgeon: Malissa Hippo, MD;  [facility]: [facility];  Service: Endoscopy;  Laterality: N/A;   HEMOSTASIS CLIP PLACEMENT  08/07/2022   Procedure: HEMOSTASIS CLIP PLACEMENT;  Surgeon: Dolores Frame, MD;  [facility];  Service: Gastroenterology;;   LAPAROSCOPIC APPENDECTOMY N/A 07/14/2020   Procedure: APPENDECTOMY LAPAROSCOPIC;  Surgeon: Lucretia Roers, MD;  Location: [facility];  Service: General;  Laterality: N/A;   MOUTH SURGERY     POLYPECTOMY  11/20/2018   Procedure: POLYPECTOMY;  Surgeon: Malissa Hippo, MD;  [facility];  Service: Endoscopy;;  colon   POLYPECTOMY  08/07/2022   Procedure: POLYPECTOMY;  Surgeon: Dolores Frame, MD;  [facility];  Service: Gastroenterology;;   Skin graft to gum Right 08/2013   SUBMUCOSAL LIFTING INJECTION  08/07/2022   Procedure: SUBMUCOSAL LIFTING INJECTION;  Surgeon: Dolores Frame, MD;  [facility];  Service: Gastroenterology;;   TONSILLECTOMY AND ADENOIDECTOMY     TOTAL ABDOMINAL HYSTERECTOMY      Home Medications:  (Not in a hospital admission)   Allergies:  Allergies  Allergen Reactions   Codeine Nausea And Vomiting   Lamictal [Lamotrigine] Rash    Family History  Problem Relation Age of Onset   High Cholesterol Mother    High blood pressure Mother    Diabetes Father     Social History:  reports that she quit smoking about 16 years ago. Her smoking use included cigarettes. She started smoking about 31 years ago. She has a 22.5 pack-year smoking history. She has never used smokeless tobacco. She reports that she does not drink alcohol and does not use drugs.  ROS: A complete review  of systems was performed.  All systems are negative except for pertinent findings as noted.  Physical Exam:  Vital signs in last 24 hours: @VSRANGES @ General:  Alert and oriented, No acute distress HEENT: Normocephalic, atraumatic Neck: No JVD or lymphadenopathy Cardiovascular: Regular rate  Lungs: Normal inspiratory/expiratory excursion Extremities: No edema Neurologic: Grossly intact  I have reviewed prior pt notes  I have reviewed notes from referring/previous physicians  I have reviewed  urinalysis results  I have independently reviewed prior imaging--renal ultrasound from 2022 reviewed  Impression/Assessment:  Overactive bladder/nocturia.  She has been on oxybutynin but does have a history of significant constipation  Plan:  I have recommended that she come off of the oxybutynin.  I have given her 7 weeks of Gemtesa 75 mg, 1 nightly  Office visit in 6 weeks to recheck symptoms  Bertram Millard Karolina Zamor 02/17/2023, 8:15 AM  Bertram Millard. Ahnesty Finfrock MD

## 2023-03-24 ENCOUNTER — Encounter: Payer: Self-pay | Admitting: Urology

## 2023-03-24 ENCOUNTER — Ambulatory Visit: Payer: Medicare Other | Admitting: Urology

## 2023-03-24 VITALS — BP 133/73 | HR 69 | Temp 98.2°F

## 2023-03-24 DIAGNOSIS — R350 Frequency of micturition: Secondary | ICD-10-CM | POA: Diagnosis not present

## 2023-03-24 DIAGNOSIS — K5900 Constipation, unspecified: Secondary | ICD-10-CM

## 2023-03-24 LAB — BLADDER SCAN AMB NON-IMAGING: Scan Result: 79

## 2023-03-24 MED ORDER — VIBEGRON 75 MG PO TABS: 75.0000 mg | ORAL_TABLET | Freq: Every day | ORAL | 3 refills | Status: DC

## 2023-03-24 NOTE — Progress Notes (Signed)
History of Present Illness: Here for f/u of urinary frequency. At last visit she was given samples of Gemtesa. On oxybutynin she had frequent need for enemas d/t constipation.  She has been doing well regarding voiding, tolerating the Gemtesa and is no longer having constipation. She and her sister would like to continue that med.  Past Medical History:  Diagnosis Date   Constipation    Dysphagia    Fracture    R foot   GERD (gastroesophageal reflux disease)    History of shingles 10/2016   Mental retardation    lesion in head   Seizures (HCC)    "not fully controlled on max doses of meds" (Neuro ofc note 12/2014)   Thrombocytopenia (HCC)    Tremor     Past Surgical History:  Procedure Laterality Date   BIOPSY  09/16/2014   Procedure: BIOPSY;  Surgeon: Malissa Hippo, MD;  Location: [facility];  Service: Endoscopy;;   CATARACT EXTRACTION     both eyes, May of 2015   COLONOSCOPY     COLONOSCOPY WITH PROPOFOL N/A 11/20/2018   Procedure: COLONOSCOPY WITH PROPOFOL;  Surgeon: Malissa Hippo, MD;  [facility];  Service: Endoscopy;  Laterality: N/A;   COLONOSCOPY WITH PROPOFOL N/A 08/07/2022   Procedure: COLONOSCOPY WITH PROPOFOL;  Surgeon: Dolores Frame, MD;  [facility]: [facility];  Service: Gastroenterology;  Laterality: N/A;  7:30am, asa 1-2   ESOPHAGEAL DILATION N/A 09/16/2014   Procedure: ESOPHAGEAL DILATION WITH 54FR MALONEY DILATOR;  Surgeon: Malissa Hippo, MD;  Location: [facility];  Service: Endoscopy;  Laterality: N/A;   ESOPHAGOGASTRODUODENOSCOPY (EGD) WITH PROPOFOL N/A 09/16/2014   Procedure: ESOPHAGOGASTRODUODENOSCOPY (EGD) WITH PROPOFOL;  Surgeon: Malissa Hippo, MD;  Location: [facility];  Service: Endoscopy;  Laterality: N/A;   HEMOSTASIS CLIP PLACEMENT  08/07/2022   Procedure: HEMOSTASIS CLIP PLACEMENT;  Surgeon: Dolores Frame, MD;  [facility]: [facility];  Service: Gastroenterology;;   LAPAROSCOPIC APPENDECTOMY N/A 07/14/2020   Procedure:  APPENDECTOMY LAPAROSCOPIC;  Surgeon: Lucretia Roers, MD;  Location: [facility];  Service: General;  Laterality: N/A;   MOUTH SURGERY     POLYPECTOMY  11/20/2018   Procedure: POLYPECTOMY;  Surgeon: Malissa Hippo, MD;  [facility];  Service: Endoscopy;;  colon   POLYPECTOMY  08/07/2022   Procedure: POLYPECTOMY;  Surgeon: Dolores Frame, MD;  [facility]: [facility];  Service: Gastroenterology;;   Skin graft to gum Right 08/2013   SUBMUCOSAL LIFTING INJECTION  08/07/2022   Procedure: SUBMUCOSAL LIFTING INJECTION;  Surgeon: Dolores Frame, MD;  [facility];  Service: Gastroenterology;;   TONSILLECTOMY AND ADENOIDECTOMY     TOTAL ABDOMINAL HYSTERECTOMY      Home Medications:  Allergies as of 03/24/2023       Reactions   Codeine Nausea And Vomiting   Lamictal [lamotrigine] Rash        Medication List        Accurate as of March 24, 2023 10:52 AM. If you have any questions, ask your nurse or doctor.          atorvastatin 20 MG tablet Commonly known as: LIPITOR Take 20 mg by mouth daily.   calcium carbonate 500 MG chewable tablet Commonly known as: TUMS - dosed in mg elemental calcium Chew 1 tablet by mouth as needed for indigestion or heartburn.   CALCIUM/VITAMIN D PO Take 1 tablet by mouth 2 (two) times daily.   clonazePAM 0.5 MG tablet Commonly known  as: KLONOPIN Take 1 tablet (0.5 mg total) by mouth 2 (two) times daily.   divalproex 500 MG DR tablet Commonly known as: DEPAKOTE TAKE 2 TABLETS (1,000 MG TOTAL) BY MOUTH DAILY.   docusate sodium 100 MG capsule Commonly known as: COLACE Take 200 mg by mouth at bedtime.   ibuprofen 800 MG tablet Commonly known as: ADVIL Take 1 tablet (800 mg total) by mouth every 8 (eight) hours as needed. Take with food to prevent GI upset   ibuprofen 800 MG tablet Commonly known as: ADVIL Take 1 tablet (800 mg total) by mouth every 8 (eight) hours as needed.   multivitamin  capsule Take 1 capsule by mouth daily.   omeprazole 40 MG capsule Commonly known as: PRILOSEC Take 1 capsule (40 mg total) by mouth daily.   oxybutynin 5 MG tablet Commonly known as: DITROPAN TAKE 1 TABLET AT BEDTIME   tiZANidine 4 MG tablet Commonly known as: Zanaflex Take 1 tablet (4 mg total) by mouth every 8 (eight) hours as needed for muscle spasms. Do not take with alcohol or while driving or operating heavy machinery.  May cause drowsiness.   Vimpat 200 MG Tabs tablet Generic drug: lacosamide Take 1 tablet (200 mg total) by mouth 2 (two) times daily.   lacosamide 200 MG Tabs tablet Commonly known as: VIMPAT TAKE 1 TABLET TWICE A DAY        Allergies:  Allergies  Allergen Reactions   Codeine Nausea And Vomiting   Lamictal [Lamotrigine] Rash    Family History  Problem Relation Age of Onset   High Cholesterol Mother    High blood pressure Mother    Diabetes Father     Social History:  reports that she quit smoking about 16 years ago. Her smoking use included cigarettes. She started smoking about 31 years ago. She has a 22.5 pack-year smoking history. She has never used smokeless tobacco. She reports that she does not drink alcohol and does not use drugs.  ROS: A complete review of systems was performed.  All systems are negative except for pertinent findings as noted.  Physical Exam:  Vital signs in last 24 hours: There were no vitals taken for this visit. Constitutional:  Alert and oriented, No acute distress Cardiovascular: Regular rate  Respiratory: Normal respiratory effort GI: Abdomen is soft, nontender, nondistended, no abdominal masses. No CVAT.  Genitourinary: Normal female phallus, testes are descended bilaterally and non-tender and without masses, scrotum is normal in appearance without lesions or masses, perineum is normal on inspection. Lymphatic: No lymphadenopathy Neurologic: Grossly intact, no focal deficits Psychiatric: Normal mood and  affect  I have reviewed prior pt notes  I have reviewed urinalysis results  I have independently reviewed prior imaging--bladder scan    Impression/Assessment:  OAB sx's--doing much better on Gemtesa  Plan:  I sent in a year's worth of Gemtesa to mail order pharmacy  OV 1 year

## 2023-04-02 DIAGNOSIS — H21233 Degeneration of iris (pigmentary), bilateral: Secondary | ICD-10-CM | POA: Diagnosis not present

## 2023-04-02 DIAGNOSIS — Z961 Presence of intraocular lens: Secondary | ICD-10-CM | POA: Diagnosis not present

## 2023-04-15 ENCOUNTER — Ambulatory Visit (HOSPITAL_COMMUNITY)
Admission: RE | Admit: 2023-04-15 | Discharge: 2023-04-15 | Disposition: A | Discharge status: Home / Self Care | Payer: Medicare Other | Source: Ambulatory Visit | Attending: Family Medicine | Admitting: Family Medicine

## 2023-04-15 ENCOUNTER — Ambulatory Visit (HOSPITAL_COMMUNITY): Payer: Medicare Other

## 2023-04-15 ENCOUNTER — Other Ambulatory Visit (HOSPITAL_COMMUNITY): Payer: Self-pay | Admitting: Family Medicine

## 2023-04-15 DIAGNOSIS — G9389 Other specified disorders of brain: Secondary | ICD-10-CM | POA: Diagnosis not present

## 2023-04-15 DIAGNOSIS — R2689 Other abnormalities of gait and mobility: Secondary | ICD-10-CM | POA: Diagnosis not present

## 2023-04-15 DIAGNOSIS — G40909 Epilepsy, unspecified, not intractable, without status epilepticus: Secondary | ICD-10-CM | POA: Diagnosis not present

## 2023-04-15 DIAGNOSIS — R519 Headache, unspecified: Secondary | ICD-10-CM | POA: Insufficient documentation

## 2023-04-27 ENCOUNTER — Ambulatory Visit: Payer: Self-pay

## 2023-04-28 ENCOUNTER — Telehealth: Payer: Self-pay

## 2023-04-28 ENCOUNTER — Other Ambulatory Visit: Payer: Self-pay

## 2023-04-28 MED ORDER — CLONAZEPAM 0.5 MG PO TABS: 0.5000 mg | ORAL_TABLET | Freq: Two times a day (BID) | ORAL | 1 refills | Status: DC

## 2023-04-28 MED ORDER — CLONAZEPAM 0.5 MG PO TABS: 0.5000 mg | ORAL_TABLET | Freq: Two times a day (BID) | ORAL | 0 refills | Status: DC

## 2023-04-28 NOTE — Telephone Encounter (Signed)
Maralyn Sago,  Turns out she does need it locally now. [facility] had [facility] and now she has called in and stated she needs close by Thanks,  [employer]

## 2023-04-28 NOTE — Telephone Encounter (Signed)
Patients mother called in regarding her medication refill for   clonazePAM (KLONOPIN) 0.5 MG tablet  She [facility] that [facility] and advised that the soonest they would receive the refill would be in 7 days. She states that [facility] is completely out of this medication and they would like to see if a 1 or 2 week refill can be sent in to  [facility] [phone] - [place],  - [address] AT [facility] OF [address] & [address]   Patients mother is asking for a call back at [phone] to confirm if refill is able to be sent in

## 2023-04-28 NOTE — Telephone Encounter (Signed)
Requested Prescriptions   Pending Prescriptions Disp Refills   clonazePAM (KLONOPIN) 0.5 MG tablet 14 tablet 0    Sig: Take 1 tablet (0.5 mg total) by mouth 2 (two) times daily.   Last seen 11/12/22 Next appt 06/10/23 Dispenses   Dispensed Days Supply Quantity Provider Pharmacy  CLONAZEPAM 0.5 MG TABLET 01/20/2023 90 180 tablet Glean Salvo, NP [facility]  CLONAZEPAM 0.5MG  TABLETS 01/20/2023 7 14 each Glean Salvo, NP [facility]  CLONAZEPAM 0.5 MG TABLET 09/23/2022 90 180 tablet Glean Salvo, NP [facility]  CLONAZEPAM 0.5 MG TABLET 06/22/2022 90 180 tablet Windell Norfolk, MD [facility]Marland KitchenMarland Kitchen

## 2023-04-28 NOTE — Telephone Encounter (Signed)
Earlier I sent a 3 month supply [facility]. I just now sent 1 week supply to local [facility]. Thanks  Meds ordered this encounter  Medications   clonazePAM (KLONOPIN) 0.5 MG tablet    Sig: Take 1 tablet (0.5 mg total) by mouth 2 (two) times daily.    Dispense:  14 tablet    Refill:  0    Fill this at local pharmacy, mail order is delayed   .

## 2023-05-06 ENCOUNTER — Other Ambulatory Visit (INDEPENDENT_AMBULATORY_CARE_PROVIDER_SITE_OTHER): Payer: Self-pay | Admitting: *Deleted

## 2023-05-06 ENCOUNTER — Other Ambulatory Visit: Payer: Self-pay

## 2023-05-06 DIAGNOSIS — K219 Gastro-esophageal reflux disease without esophagitis: Secondary | ICD-10-CM

## 2023-05-06 DIAGNOSIS — R569 Unspecified convulsions: Secondary | ICD-10-CM

## 2023-05-06 MED ORDER — OMEPRAZOLE 40 MG PO CPDR: 40.0000 mg | DELAYED_RELEASE_CAPSULE | Freq: Every day | ORAL | 0 refills | Status: DC

## 2023-05-06 NOTE — Telephone Encounter (Signed)
Requested Prescriptions   Pending Prescriptions Disp Refills   lacosamide (VIMPAT) 200 MG TABS tablet 180 tablet 1    Sig: Take 1 tablet (200 mg total) by mouth 2 (two) times daily.   Last seen 11/12/22 Next appt 06/10/23 Dispenses    Dispensed Days Supply Quantity Provider Pharmacy  LACOSAMIDE 200 MG TABLET 04/26/2023 90 180 tablet Glean Salvo, NP [facility]  LACOSAMIDE 200 MG TABLET 01/21/2023 90 180 tablet Glean Salvo, NP [facility]  LACOSAMIDE 200 MG TABLET 10/27/2022 90 180 tablet Glean Salvo, NP [facility]  LACOSAMIDE 200 MG TABLET 08/08/2022 90 180 tablet Glean Salvo, NP [facility]  LACOSAMIDE 200 MG TABLET 05/04/2022 90 180 tablet Sater, Pearletha Furl, MD [facility]      Refill denied

## 2023-05-12 ENCOUNTER — Other Ambulatory Visit: Payer: Self-pay

## 2023-05-12 DIAGNOSIS — R569 Unspecified convulsions: Secondary | ICD-10-CM

## 2023-05-12 NOTE — Telephone Encounter (Signed)
Requested Prescriptions   Pending Prescriptions Disp Refills   lacosamide (VIMPAT) 200 MG TABS tablet 180 tablet 1    Sig: Take 1 tablet (200 mg total) by mouth 2 (two) times daily.   Last seen 11/12/22, next appt 06/10/23  Dispenses    Dispensed Days Supply Quantity Provider Pharmacy  LACOSAMIDE 200 MG TABLET 04/26/2023 90 180 tablet Glean Salvo, NP [facility]  LACOSAMIDE 200 MG TABLET 01/21/2023 90 180 tablet Glean Salvo, NP [facility]  LACOSAMIDE 200 MG TABLET 10/27/2022 90 180 tablet Glean Salvo, NP [facility]  LACOSAMIDE 200 MG TABLET 08/08/2022 90 180 tablet Glean Salvo, NP [facility]  LACOSAMIDE 200 MG TABLET 05/04/2022 90 180 tablet Sater, Pearletha Furl, MD [facility]      Refill denied due to recent dispense of 90 day supply

## 2023-05-19 ENCOUNTER — Other Ambulatory Visit: Payer: Self-pay

## 2023-05-19 DIAGNOSIS — R569 Unspecified convulsions: Secondary | ICD-10-CM

## 2023-05-19 NOTE — Telephone Encounter (Signed)
Requested Prescriptions   Pending Prescriptions Disp Refills   lacosamide (VIMPAT) 200 MG TABS tablet 180 tablet 1    Sig: Take 1 tablet (200 mg total) by mouth 2 (two) times daily.   Last seen 11/12/22 Next appt 06/10/23 Dispenses   Dispensed Days Supply Quantity Provider Pharmacy  LACOSAMIDE 200 MG TABLET 04/26/2023 90 180 tablet Glean Salvo, NP [facility]  LACOSAMIDE 200 MG TABLET 01/21/2023 90 180 tablet Glean Salvo, NP [facility]  LACOSAMIDE 200 MG TABLET 10/27/2022 90 180 tablet Glean Salvo, NP [facility]  LACOSAMIDE 200 MG TABLET 08/08/2022 90 180 tablet Glean Salvo, NP [facility]Marland KitchenMarland Kitchen

## 2023-05-21 ENCOUNTER — Telehealth: Payer: Self-pay

## 2023-05-21 NOTE — Telephone Encounter (Signed)
Pt mother called requesting gemtesa refills. We let her know her current pharmacy has refills on file.

## 2023-05-22 ENCOUNTER — Telehealth: Payer: Self-pay | Admitting: Urology

## 2023-05-22 NOTE — Telephone Encounter (Signed)
PA Case ID/approval: [id number] [facility]) Pt was called to be made aware of approval

## 2023-05-22 NOTE — Telephone Encounter (Signed)
Needs prior auth for [employer], please call [phone] [facility]

## 2023-05-26 ENCOUNTER — Ambulatory Visit: Payer: Medicare Other | Admitting: Neurology

## 2023-05-27 ENCOUNTER — Ambulatory Visit (INDEPENDENT_AMBULATORY_CARE_PROVIDER_SITE_OTHER): Payer: Medicare Other

## 2023-05-27 ENCOUNTER — Ambulatory Visit
Admission: EM | Admit: 2023-05-27 | Discharge: 2023-05-27 | Disposition: A | Discharge status: Home / Self Care | Payer: Medicare Other | Attending: Family Medicine | Admitting: Family Medicine

## 2023-05-27 DIAGNOSIS — M79601 Pain in right arm: Secondary | ICD-10-CM

## 2023-05-27 DIAGNOSIS — M19041 Primary osteoarthritis, right hand: Secondary | ICD-10-CM | POA: Diagnosis not present

## 2023-05-27 DIAGNOSIS — M1811 Unilateral primary osteoarthritis of first carpometacarpal joint, right hand: Secondary | ICD-10-CM | POA: Diagnosis not present

## 2023-05-27 DIAGNOSIS — W19XXXA Unspecified fall, initial encounter: Secondary | ICD-10-CM | POA: Diagnosis not present

## 2023-05-27 DIAGNOSIS — M19031 Primary osteoarthritis, right wrist: Secondary | ICD-10-CM | POA: Diagnosis not present

## 2023-05-27 DIAGNOSIS — M25531 Pain in right wrist: Secondary | ICD-10-CM | POA: Diagnosis not present

## 2023-05-27 NOTE — Discharge Instructions (Signed)
We will call if anything comes back abnormal on your x-rays.  We have placed you in a sling for comfort, you do not need to wear this but if it is helping with your pain you may wear it as needed.  Ice the areas of bruising and swelling, elevate at rest, ibuprofen and Tylenol as needed for pain.

## 2023-05-27 NOTE — ED Triage Notes (Signed)
Per brother, pt fell this morning on the concrete\ this morning and landed on right arm.

## 2023-05-29 ENCOUNTER — Telehealth: Payer: Self-pay

## 2023-05-29 NOTE — Telephone Encounter (Signed)
Return call to patient/mom making them aware that the RX for [name] was approve. [facility] and spoke with Judeth Cornfield, she states patient medication was approved indefinitely. Patient was called and made aware. Voiced understanding

## 2023-05-31 ENCOUNTER — Encounter (HOSPITAL_COMMUNITY): Payer: Self-pay

## 2023-05-31 ENCOUNTER — Emergency Department (HOSPITAL_COMMUNITY): Payer: Medicare Other

## 2023-05-31 ENCOUNTER — Other Ambulatory Visit: Payer: Self-pay

## 2023-05-31 ENCOUNTER — Emergency Department (HOSPITAL_COMMUNITY)
Admission: EM | Admit: 2023-05-31 | Discharge: 2023-05-31 | Disposition: A | Discharge status: Home / Self Care | Payer: Medicare Other | Attending: Emergency Medicine | Admitting: Emergency Medicine

## 2023-05-31 DIAGNOSIS — W19XXXA Unspecified fall, initial encounter: Secondary | ICD-10-CM | POA: Diagnosis not present

## 2023-05-31 DIAGNOSIS — M7989 Other specified soft tissue disorders: Secondary | ICD-10-CM | POA: Diagnosis not present

## 2023-05-31 DIAGNOSIS — M70811 Other soft tissue disorders related to use, overuse and pressure, right shoulder: Secondary | ICD-10-CM | POA: Diagnosis not present

## 2023-05-31 DIAGNOSIS — M19011 Primary osteoarthritis, right shoulder: Secondary | ICD-10-CM | POA: Diagnosis not present

## 2023-05-31 DIAGNOSIS — S42211A Unspecified displaced fracture of surgical neck of right humerus, initial encounter for closed fracture: Secondary | ICD-10-CM | POA: Diagnosis not present

## 2023-05-31 DIAGNOSIS — S4991XA Unspecified injury of right shoulder and upper arm, initial encounter: Secondary | ICD-10-CM | POA: Diagnosis not present

## 2023-05-31 DIAGNOSIS — S42351A Displaced comminuted fracture of shaft of humerus, right arm, initial encounter for closed fracture: Secondary | ICD-10-CM | POA: Diagnosis not present

## 2023-05-31 MED ORDER — OXYCODONE-ACETAMINOPHEN 5-325 MG PO TABS: 1.0000 | ORAL_TABLET | Freq: Four times a day (QID) | ORAL | 0 refills | Status: DC | PRN

## 2023-05-31 MED ORDER — OXYCODONE-ACETAMINOPHEN 5-325 MG PO TABS
2.0000 | ORAL_TABLET | Freq: Once | ORAL | Status: AC
  Administered 2023-05-31: 2 via ORAL
  Filled 2023-05-31: qty 2

## 2023-05-31 NOTE — ED Triage Notes (Signed)
Pt fell 1 week ago and had negative xrays of shoulder, humerus and wrist at urgent care but continues to have pain.

## 2023-05-31 NOTE — ED Provider Notes (Signed)
[place] EMERGENCY DEPARTMENT AT [facility] Provider Note   CSN: [id number] Arrival date & time: 05/31/23  1116     History  Chief Complaint  Patient presents with   Arm Injury    PARISHA AMEY is a 62 y.o. female.   Arm Injury  This patient is a very pleasant 62 year old female, she unfortunately has some intellectual disability and is cared for by her brother.  She had an accidental fall on Christmas eve day and was seen at an urgent care because of pain in the right upper extremity around the shoulder, reportedly the x-rays were unremarkable and she was discharged home to take over-the-counter medications with rice therapy and a sling.  The brother reports that she used ice packs 1 time but stated hurt more so she would not use them anymore and she stopped using the sling because she stated it was hard to get in and out of.  There has been no further injuries or accidents however the patient is complaining of ongoing pain and the brother noticed that there was bruising and swelling in that right upper extremity.    Home Medications Prior to Admission medications   Medication Sig Start Date End Date Taking? Authorizing Provider  oxyCODONE-acetaminophen (PERCOCET/ROXICET) 5-325 MG tablet Take 1 tablet by mouth every 6 (six) hours as needed for severe pain (pain score 7-10). 05/31/23  Yes Eber Hong, MD  atorvastatin (LIPITOR) 20 MG tablet Take 20 mg by mouth daily. 08/24/18   [provider]  Calcium Carb-Cholecalciferol (CALCIUM/VITAMIN D PO) Take 1 tablet by mouth 2 (two) times daily.    [provider]  calcium carbonate (TUMS - DOSED IN MG ELEMENTAL CALCIUM) 500 MG chewable tablet Chew 1 tablet by mouth as needed for indigestion or heartburn.    [provider]  clonazePAM (KLONOPIN) 0.5 MG tablet Take 1 tablet (0.5 mg total) by mouth 2 (two) times daily. 04/28/23   Glean Salvo, NP  divalproex (DEPAKOTE) 500 MG DR tablet TAKE 2  TABLETS (1,000 MG TOTAL) BY MOUTH DAILY. 07/10/22 07/10/23  Glean Salvo, NP  docusate sodium (COLACE) 100 MG capsule Take 200 mg by mouth at bedtime.    [provider]  ibuprofen (ADVIL) 800 MG tablet Take 1 tablet (800 mg total) by mouth every 8 (eight) hours as needed. Take with food to prevent GI upset 11/12/22   Valentino Nose, NP  ibuprofen (ADVIL) 800 MG tablet Take 1 tablet (800 mg total) by mouth every 8 (eight) hours as needed. 12/27/22   Particia Nearing, PA-C  lacosamide (VIMPAT) 200 MG TABS tablet TAKE 1 TABLET TWICE A DAY 01/21/23   Glean Salvo, NP  Multiple Vitamin (MULTIVITAMIN) capsule Take 1 capsule by mouth daily.    [provider]  omeprazole (PRILOSEC) 40 MG capsule Take 1 capsule (40 mg total) by mouth daily. 05/06/23   Dolores Frame, MD  tiZANidine (ZANAFLEX) 4 MG tablet Take 1 tablet (4 mg total) by mouth every 8 (eight) hours as needed for muscle spasms. Do not take with alcohol or while driving or operating heavy machinery.  May cause drowsiness. 11/12/22   Valentino Nose, NP  Vibegron 75 MG TABS Take 1 tablet (75 mg total) by mouth daily. 03/24/23   Marcine Matar, MD  VIMPAT 200 MG TABS tablet Take 1 tablet (200 mg total) by mouth 2 (two) times daily. 02/22/21   Sater, Pearletha Furl, MD  QUEtiapine (SEROQUEL) 25 MG tablet Take  1 tablet (25 mg total) by mouth 2 (two) times daily. 05/31/20 06/06/20  Angiulli, Mcarthur Rossetti, PA-C      Allergies    Codeine and Lamictal [lamotrigine]    Review of Systems   Review of Systems  All other systems reviewed and are negative.   Physical Exam Updated Vital Signs BP 135/86 (BP Location: Left Arm)   Pulse 89   Temp 99.3 F (37.4 C) (Oral)   Resp 16   Ht 1.651 m (5\' 5" )   Wt 69.9 kg   SpO2 95%   BMI 25.64 kg/m  Physical Exam Vitals and nursing note reviewed.  Constitutional:      General: She is not in acute distress.    Appearance: She is well-developed.  HENT:     Head:  Normocephalic and atraumatic.     Mouth/Throat:     Pharynx: No oropharyngeal exudate.  Eyes:     General: No scleral icterus.       Right eye: No discharge.        Left eye: No discharge.     Conjunctiva/sclera: Conjunctivae normal.     Pupils: Pupils are equal, round, and reactive to light.  Neck:     Thyroid: No thyromegaly.     Vascular: No JVD.  Cardiovascular:     Rate and Rhythm: Normal rate and regular rhythm.     Heart sounds: Normal heart sounds. No murmur heard.    No friction rub. No gallop.  Pulmonary:     Effort: Pulmonary effort is normal. No respiratory distress.     Breath sounds: Normal breath sounds. No wheezing or rales.  Abdominal:     General: Bowel sounds are normal. There is no distension.     Palpations: Abdomen is soft. There is no mass.     Tenderness: There is no abdominal tenderness.  Musculoskeletal:        General: Swelling, tenderness and signs of injury present.     Cervical back: Normal range of motion and neck supple.     Right lower leg: No edema.     Left lower leg: No edema.     Comments: All 4 extremities examined, only the right upper extremity has any tenderness and swelling which is located in the upper extremity just distal to the shoulder joint.  There is decreased range of motion of the shoulder but a normal round contour.  She has good range of motion of the right elbow and wrist and normal pulses at the right wrist with sensation and grip.  Lymphadenopathy:     Cervical: No cervical adenopathy.  Skin:    General: Skin is warm and dry.     Findings: No erythema or rash.  Neurological:     Mental Status: She is alert.     Coordination: Coordination normal.  Psychiatric:        Behavior: Behavior normal.     ED Results / Procedures / Treatments   Labs (all labs ordered are listed, but only abnormal results are displayed) Labs Reviewed - No data to display  EKG None  Radiology DG Humerus Right Result Date:  05/31/2023 CLINICAL DATA:  trauma, swelling EXAM: RIGHT SHOULDER - 2+ VIEW; RIGHT HUMERUS - 2+ VIEW COMPARISON:  Right shoulder and humerus radiograph from 05/27/2023. FINDINGS: Since the prior study, there is new comminuted fracture of the lateral aspect of surgical neck region of the right proximal humerus. There is also new curvilinear calcification in the subacromial region, which may  represent proximal humerus fracture fragment however, further evaluation with CT scan is recommended to exclude ? displaced inferior glenoid fracture. There is probable inferior subluxation of right glenohumeral joint. No other acute fracture. No aggressive osseous lesion. The acromioclavicular joint is normal in alignment and exhibit no significant degenerative changes. No soft tissue swelling. No radiopaque foreign bodies. IMPRESSION: *Since the prior study, there is new comminuted fracture of the surgical neck of humerus, as described above. Further evaluation with shoulder CT scan is recommended to evaluate for glenoid fracture. Electronically Signed   By: Jules Schick M.D.   On: 05/31/2023 14:01   DG Shoulder Right Result Date: 05/31/2023 CLINICAL DATA:  trauma, swelling EXAM: RIGHT SHOULDER - 2+ VIEW; RIGHT HUMERUS - 2+ VIEW COMPARISON:  Right shoulder and humerus radiograph from 05/27/2023. FINDINGS: Since the prior study, there is new comminuted fracture of the lateral aspect of surgical neck region of the right proximal humerus. There is also new curvilinear calcification in the subacromial region, which may represent proximal humerus fracture fragment however, further evaluation with CT scan is recommended to exclude ? displaced inferior glenoid fracture. There is probable inferior subluxation of right glenohumeral joint. No other acute fracture. No aggressive osseous lesion. The acromioclavicular joint is normal in alignment and exhibit no significant degenerative changes. No soft tissue swelling. No radiopaque  foreign bodies. IMPRESSION: *Since the prior study, there is new comminuted fracture of the surgical neck of humerus, as described above. Further evaluation with shoulder CT scan is recommended to evaluate for glenoid fracture. Electronically Signed   By: Jules Schick M.D.   On: 05/31/2023 14:01    Procedures Procedures    Medications Ordered in ED Medications  oxyCODONE-acetaminophen (PERCOCET/ROXICET) 5-325 MG per tablet 2 tablet (2 tablets Oral Given 05/31/23 1346)    ED Course/ Medical Decision Making/ A&P                                 Medical Decision Making Amount and/or Complexity of Data Reviewed Radiology: ordered.  Risk Prescription drug management.    This patient presents to the ED for concern of injury to the right upper extremity differential diagnosis includes fracture, dislocation, strain or sprain, rotator cuff injury or biceps tear    Additional history obtained:  Additional history obtained from medical record External records from outside source obtained and reviewed including urgent care notes The patient has had office visits for urinary frequency and is followed by urology, had a colonoscopy back in March, no other recent admissions to the hospital for medical reasons   Imaging Studies ordered:  I ordered imaging studies including x-ray of the shoulder and humerus on the right I independently visualized and interpreted imaging which showed porx humerus fracture I agree with the radiologist interpretation   Medicines ordered and prescription drug management:  I ordered medication including oxycodone for pain Reevaluation of the patient after these medicines showed that the patient improved I have reviewed the patients home medicines and have made adjustments as needed   Problem List / ED Course:  Shoulder immobilizer - placed by RN Pain meds given Family at bedside - agreeable to outpt f/u.   Social Determinants of Health:  Special  needs Intellectual disability CT ordered - can f/u with ortho Son in agreeme           Final Clinical Impression(s) / ED Diagnoses Final diagnoses:  Closed displaced fracture of surgical neck of  right humerus, unspecified fracture morphology, initial encounter    Rx / DC Orders ED Discharge Orders          Ordered    oxyCODONE-acetaminophen (PERCOCET/ROXICET) 5-325 MG tablet  Every 6 hours PRN        05/31/23 1521              Eber Hong, MD 05/31/23 1524

## 2023-05-31 NOTE — ED Provider Notes (Signed)
[facility]    CSN: [id number] Arrival date & time: 05/27/23  1429      History   Chief Complaint No chief complaint on file.   HPI Maria [name] is a 62 y.o. female.   Patient presenting today with her brother who provides most of the history today.  States she fell onto concrete this morning landing on the right arm and is having multiple areas of bruising, swelling and pain including the right shoulder, right elbow and right wrist.  She denies complete loss of range of motion, head injury, loss of consciousness, dizziness, chest pain, shortness of breath, areas of skin injury.  So far trying ibuprofen with minimal relief.    Past Medical History:  Diagnosis Date   Constipation    Dysphagia    Fracture    R foot   GERD (gastroesophageal reflux disease)    History of shingles 10/2016   Mental retardation    lesion in head   Seizures (HCC)    "not fully controlled on max doses of meds" (Neuro ofc note 12/2014)   Thrombocytopenia (HCC)    Tremor     Patient Active Problem List   Diagnosis Date Noted   History of colonic polyps 08/07/2022   Pain in right shoulder 08/24/2021   Clavicle pain 08/15/2021   Acute urinary tract infection 03/13/2021   Dysuria 03/13/2021   COVID-19 02/19/2021   Allergic rhinitis 10/24/2020   Anemia 10/24/2020   Anxiety 10/24/2020   Heart failure (HCC) 10/24/2020   History of sepsis 10/24/2020   Mixed hyperlipidemia 10/24/2020   Neck pain 10/24/2020   Osteopenia 10/24/2020   Overactive bladder 10/24/2020   Polyp of colon 10/24/2020   ESBL (extended spectrum beta-lactamase) producing bacteria infection 05/12/2020   Bacteremia due to Escherichia coli 05/12/2020   Severe sepsis (HCC) 05/12/2020   Debility 05/05/2020   Pressure injury of skin 04/24/2020   Palliative care by specialist    Pyelonephritis of left kidney 04/14/2020   Renal abscess, left 04/14/2020   Leukocytosis 04/14/2020   Fever 04/14/2020   LLQ abdominal  pain 04/14/2020   AKI (acute kidney injury) (HCC)    Sepsis secondary to UTI (HCC)    Pain in left ankle and joints of left foot 12/02/2019   History of epilepsy 08/04/2019   Positive colorectal cancer screening using Cologuard test 10/28/2018   Posterior tibial tendinitis, right leg 04/23/2016   Pain in right foot 04/12/2016   Acute encephalopathy    Myoclonus    Frequent falls 11/09/2015   Fall 11/09/2015   Chest pain 03/16/2014   Upper abdominal pain 03/16/2014   Seizure disorder (HCC) 03/16/2014   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   Generalized convulsive epilepsy (HCC) 10/06/2012   DNR (do not resuscitate) discussion 10/06/2012   Intellectual disability    Thrombocytopenia (HCC) 05/23/2011   Seizures (HCC) 03/21/2011   GERD (gastroesophageal reflux disease) 03/21/2011   CLOSED FRACTURE OF ACROMIAL END OF CLAVICLE 07/13/2008    Past Surgical History:  Procedure Laterality Date   BIOPSY  09/16/2014   Procedure: BIOPSY;  Surgeon: Malissa Hippo, MD;  Location: [facility];  Service: Endoscopy;;   CATARACT EXTRACTION     both eyes, May of 2015   COLONOSCOPY     COLONOSCOPY WITH PROPOFOL N/A 11/20/2018   Procedure: COLONOSCOPY WITH PROPOFOL;  Surgeon: Malissa Hippo, MD;  [facility]: [facility];  Service: Endoscopy;  Laterality: N/A;   COLONOSCOPY WITH PROPOFOL N/A 08/07/2022  Procedure: COLONOSCOPY WITH PROPOFOL;  Surgeon: Dolores Frame, MD;  [facility]: [facility];  Service: Gastroenterology;  Laterality: N/A;  7:30am, asa 1-2   ESOPHAGEAL DILATION N/A 09/16/2014   Procedure: ESOPHAGEAL DILATION WITH 54FR MALONEY DILATOR;  Surgeon: Malissa Hippo, MD;  Location: [facility];  Service: Endoscopy;  Laterality: N/A;   ESOPHAGOGASTRODUODENOSCOPY (EGD) WITH PROPOFOL N/A 09/16/2014   Procedure: ESOPHAGOGASTRODUODENOSCOPY (EGD) WITH PROPOFOL;  Surgeon: Malissa Hippo, MD;  Location: [facility];  Service: Endoscopy;  Laterality: N/A;   HEMOSTASIS CLIP PLACEMENT  08/07/2022    Procedure: HEMOSTASIS CLIP PLACEMENT;  Surgeon: Dolores Frame, MD;  [facility];  Service: Gastroenterology;;   LAPAROSCOPIC APPENDECTOMY N/A 07/14/2020   Procedure: APPENDECTOMY LAPAROSCOPIC;  Surgeon: Lucretia Roers, MD;  Location: [facility];  Service: General;  Laterality: N/A;   MOUTH SURGERY     POLYPECTOMY  11/20/2018   Procedure: POLYPECTOMY;  Surgeon: Malissa Hippo, MD;  [facility]: [facility];  Service: Endoscopy;;  colon   POLYPECTOMY  08/07/2022   Procedure: POLYPECTOMY;  Surgeon: Dolores Frame, MD;  [facility];  Service: Gastroenterology;;   Skin graft to gum Right 08/2013   SUBMUCOSAL LIFTING INJECTION  08/07/2022   Procedure: SUBMUCOSAL LIFTING INJECTION;  Surgeon: Dolores Frame, MD;  [facility];  Service: Gastroenterology;;   TONSILLECTOMY AND ADENOIDECTOMY     TOTAL ABDOMINAL HYSTERECTOMY      OB History   No obstetric history on file.      Home Medications    Prior to Admission medications   Medication Sig Start Date End Date Taking? Authorizing Provider  atorvastatin (LIPITOR) 20 MG tablet Take 20 mg by mouth daily. 08/24/18   [provider]  Calcium Carb-Cholecalciferol (CALCIUM/VITAMIN D PO) Take 1 tablet by mouth 2 (two) times daily.    [provider]  calcium carbonate (TUMS - DOSED IN MG ELEMENTAL CALCIUM) 500 MG chewable tablet Chew 1 tablet by mouth as needed for indigestion or heartburn.    [provider]  clonazePAM (KLONOPIN) 0.5 MG tablet Take 1 tablet (0.5 mg total) by mouth 2 (two) times daily. 04/28/23   Glean Salvo, NP  divalproex (DEPAKOTE) 500 MG DR tablet TAKE 2 TABLETS (1,000 MG TOTAL) BY MOUTH DAILY. 07/10/22 07/10/23  Glean Salvo, NP  docusate sodium (COLACE) 100 MG capsule Take 200 mg by mouth at bedtime.    [provider]  ibuprofen (ADVIL) 800 MG tablet Take 1 tablet (800 mg total) by mouth every 8 (eight) hours as needed. Take with  food to prevent GI upset 11/12/22   Valentino Nose, NP  ibuprofen (ADVIL) 800 MG tablet Take 1 tablet (800 mg total) by mouth every 8 (eight) hours as needed. 12/27/22   Particia Nearing, PA-C  lacosamide (VIMPAT) 200 MG TABS tablet TAKE 1 TABLET TWICE A DAY 01/21/23   Glean Salvo, NP  Multiple Vitamin (MULTIVITAMIN) capsule Take 1 capsule by mouth daily.    [provider]  omeprazole (PRILOSEC) 40 MG capsule Take 1 capsule (40 mg total) by mouth daily. 05/06/23   Dolores Frame, MD  tiZANidine (ZANAFLEX) 4 MG tablet Take 1 tablet (4 mg total) by mouth every 8 (eight) hours as needed for muscle spasms. Do not take with alcohol or while driving or operating heavy machinery.  May cause drowsiness. 11/12/22   Valentino Nose, NP  Vibegron 75 MG TABS Take 1 tablet (75 mg total) by mouth  daily. 03/24/23   Marcine Matar, MD  VIMPAT 200 MG TABS tablet Take 1 tablet (200 mg total) by mouth 2 (two) times daily. 02/22/21   Sater, Pearletha Furl, MD  QUEtiapine (SEROQUEL) 25 MG tablet Take 1 tablet (25 mg total) by mouth 2 (two) times daily. 05/31/20 06/06/20  Angiulli, Mcarthur Rossetti, PA-C    Family History Family History  Problem Relation Age of Onset   High Cholesterol Mother    High blood pressure Mother    Diabetes Father     Social History Social History   Tobacco Use   Smoking status: Former    Current packs/day: 0.00    Average packs/day: 1.5 packs/day for 15.0 years (22.5 ttl pk-yrs)    Types: Cigarettes    Start date: 05/23/1991    Quit date: 05/22/2006    Years since quitting: 17.0   Smokeless tobacco: Never  Vaping Use   Vaping status: Never Used  Substance Use Topics   Alcohol use: No    Alcohol/week: 0.0 standard drinks of alcohol   Drug use: No     Allergies   Codeine and Lamictal [lamotrigine]   Review of Systems Review of Systems Per HPI  Physical Exam Triage Vital Signs ED Triage Vitals  Encounter Vitals Group     BP 05/27/23 1641  127/80     Systolic BP Percentile --      Diastolic BP Percentile --      Pulse Rate 05/27/23 1641 89     Resp 05/27/23 1641 18     Temp 05/27/23 1641 99.5 F (37.5 C)     Temp Source 05/27/23 1641 Oral     SpO2 05/27/23 1641 96 %     Weight --      Height --      Head Circumference --      Peak Flow --      Pain Score 05/27/23 1556 7     Pain Loc --      Pain Education --      Exclude from Growth Chart --    No data found.  Updated Vital Signs BP 127/80 (BP Location: Right Arm)   Pulse 89   Temp 99.5 F (37.5 C) (Oral)   Resp 18   SpO2 96%   Visual Acuity Right Eye Distance:   Left Eye Distance:   Bilateral Distance:    Right Eye Near:   Left Eye Near:    Bilateral Near:     Physical Exam Vitals and nursing note reviewed.  Constitutional:      Appearance: Normal appearance. She is not ill-appearing.  HENT:     Head: Atraumatic.  Eyes:     Extraocular Movements: Extraocular movements intact.     Conjunctiva/sclera: Conjunctivae normal.  Cardiovascular:     Rate and Rhythm: Normal rate and regular rhythm.     Heart sounds: Normal heart sounds.  Pulmonary:     Effort: Pulmonary effort is normal.     Breath sounds: Normal breath sounds.  Musculoskeletal:        General: Normal range of motion.     Cervical back: Normal range of motion and neck supple.     Comments: Range of motion to the right upper extremity intact but painful.  Area of contusion to the ulnar aspect of the right wrist as well as the right upper arm just above the elbow, significantly tender to palpation in these areas.  No obvious bony deformities palpable to the right upper  extremity  Skin:    General: Skin is warm and dry.     Findings: Bruising present.  Neurological:     Mental Status: She is alert. Mental status is at baseline.     Comments: Right upper extremity neurovascular intact  Psychiatric:        Mood and Affect: Mood normal.        Thought Content: Thought content normal.         Judgment: Judgment normal.      UC Treatments / Results  Labs (all labs ordered are listed, but only abnormal results are displayed) Labs Reviewed - No data to display  EKG   Radiology No results found.  Procedures Procedures (including critical care time)  Medications Ordered in UC Medications - No data to display  Initial Impression / Assessment and Plan / UC Course  I have reviewed the triage vital signs and the nursing notes.  Pertinent labs & imaging results that were available during my care of the patient were reviewed by me and considered in my medical decision making (see chart for details).     X-rays performed of the right shoulder, elbow and wrist all negative for acute bony abnormalities.  She was placed in a sling for comfort and discussed RICE protocol, over-the-counter pain relievers.  PCP follow-up if not resolving. Final Clinical Impressions(s) / UC Diagnoses   Final diagnoses:  Right arm pain  Fall, initial encounter     Discharge Instructions      We will call if anything comes back abnormal on your x-rays.  We have placed you in a sling for comfort, you do not need to wear this but if it is helping with your pain you may wear it as needed.  Ice the areas of bruising and swelling, elevate at rest, ibuprofen and Tylenol as needed for pain.   ED Prescriptions   None    PDMP not reviewed this encounter.   Particia Nearing, [place] 05/31/23 1019

## 2023-05-31 NOTE — Discharge Instructions (Signed)
It appears that you have an arm fracture by your shoulder, sometimes these are surgical although it is rare, we have obtained a CT scan that the orthopedic surgeon can review with you but I would recommend that you see them within the next week.  You will need to use the shoulder immobilizer, ice, elevate the head of the bed and I will send some pain medicine to the pharmacy.  You can choose to see the local orthopedist Dr. Romeo Apple or Dr. Aundria Rud who is on-call, both of those practices will be able to see you this coming week.  Call for the next available appointment on Monday morning

## 2023-06-02 ENCOUNTER — Ambulatory Visit (INDEPENDENT_AMBULATORY_CARE_PROVIDER_SITE_OTHER): Payer: Medicare Other | Admitting: Orthopedic Surgery

## 2023-06-02 ENCOUNTER — Telehealth: Payer: Self-pay

## 2023-06-02 ENCOUNTER — Encounter: Payer: Self-pay | Admitting: Orthopedic Surgery

## 2023-06-02 VITALS — BP 165/88 | HR 105 | Ht 66.0 in | Wt 146.0 lb

## 2023-06-02 DIAGNOSIS — S42251A Displaced fracture of greater tuberosity of right humerus, initial encounter for closed fracture: Secondary | ICD-10-CM | POA: Diagnosis not present

## 2023-06-02 MED ORDER — IBUPROFEN 800 MG PO TABS: 800.0000 mg | ORAL_TABLET | Freq: Three times a day (TID) | ORAL | 1 refills | Status: DC | PRN

## 2023-06-02 MED ORDER — HYDROCODONE-ACETAMINOPHEN 5-325 MG PO TABS: 1.0000 | ORAL_TABLET | Freq: Four times a day (QID) | ORAL | 0 refills | Status: DC | PRN

## 2023-06-02 NOTE — Telephone Encounter (Signed)
Pt mom believes her daughters vibegron is causing her pain because she's having the urge to go to the restroom frequently but she's rarely actually urinating. Went to the restroom 4 times within an hour.

## 2023-06-02 NOTE — Progress Notes (Signed)
Chief Complaint  Patient presents with   Shoulder Pain    R/ Shoulder fracture DOI 05/26/23    62 year old female history of seizure disorder followed by neurology fell fractured her right shoulder comminuted fracture of the greater tuberosity  X-ray CT scan done.  Fracture fragments in position unclear based on CT scan  Patient comes in very lethargic on oxycodone   Review of Systems  Constitutional:  Negative for fever.  Respiratory:  Negative for shortness of breath.   Cardiovascular:  Negative for chest pain.  Skin: Negative.   Neurological:  Negative for tingling and sensory change.   BP (!) 165/88   Pulse (!) 105   Ht 5\' 6"  (1.676 m)   Wt 146 lb (66.2 kg)   BMI 23.57 kg/m   Patient very lethargic  Right shoulder in a sling  From what we can tell the neurovascular exam is intact again very lethargic  Plain films were reviewed from 24 December 28th fracture fragment seen on the 28th not there on the 24th follow-up CT scan also evaluated shows comminuted fragments of the greater tuberosity  Recommend stopping the oxycodone to allow the lethargy to subside she can take ibuprofen and hydrocodone I asked the brother to stop giving her pain medicine for 24 hours  When she comes back if she is not lethargic order MRI to assess the greater tuberosity fragments

## 2023-06-05 DIAGNOSIS — M25511 Pain in right shoulder: Secondary | ICD-10-CM | POA: Diagnosis not present

## 2023-06-06 ENCOUNTER — Encounter (HOSPITAL_BASED_OUTPATIENT_CLINIC_OR_DEPARTMENT_OTHER): Payer: Self-pay | Admitting: Orthopaedic Surgery

## 2023-06-06 DIAGNOSIS — M25511 Pain in right shoulder: Secondary | ICD-10-CM | POA: Diagnosis not present

## 2023-06-06 NOTE — Progress Notes (Signed)
 Pre op call done with patient's mother and brother. Brother reports patient last had a seizure 'a week, week and a half ago'. Explained to family that pt's surgery will need to be done at the hospital due to recent seizure activity. LVM with scheduler Sherri at Dr Raguel office letting her know that surgery will need to be moved.

## 2023-06-09 ENCOUNTER — Encounter: Payer: Medicare Other | Admitting: Orthopedic Surgery

## 2023-06-09 ENCOUNTER — Telehealth: Payer: Self-pay | Admitting: Neurology

## 2023-06-09 NOTE — Telephone Encounter (Signed)
 Pt's mother called to r/s appt due to pt breaking her shoulder. Rescheduled and wait listed

## 2023-06-10 ENCOUNTER — Ambulatory Visit: Payer: Medicare Other | Admitting: Neurology

## 2023-06-10 NOTE — Telephone Encounter (Signed)
 Called Pt but she didn't understand I asked her to have her mom or brother call back and she was still unclear will give Pt another phone call later

## 2023-06-11 ENCOUNTER — Encounter (HOSPITAL_COMMUNITY): Payer: Self-pay

## 2023-06-11 ENCOUNTER — Encounter (HOSPITAL_COMMUNITY)
Admission: RE | Admit: 2023-06-11 | Discharge: 2023-06-11 | Disposition: A | Discharge status: Home / Self Care | Payer: Medicare Other | Source: Ambulatory Visit | Attending: Orthopaedic Surgery | Admitting: Orthopaedic Surgery

## 2023-06-11 ENCOUNTER — Other Ambulatory Visit: Payer: Self-pay

## 2023-06-11 VITALS — BP 119/73 | HR 83 | Temp 97.4°F | Resp 16 | Ht 66.0 in

## 2023-06-11 DIAGNOSIS — I509 Heart failure, unspecified: Secondary | ICD-10-CM | POA: Diagnosis not present

## 2023-06-11 DIAGNOSIS — I498 Other specified cardiac arrhythmias: Secondary | ICD-10-CM | POA: Diagnosis not present

## 2023-06-11 DIAGNOSIS — Z01818 Encounter for other preprocedural examination: Secondary | ICD-10-CM | POA: Diagnosis not present

## 2023-06-11 HISTORY — DX: Pneumonia, unspecified organism: J18.9

## 2023-06-11 HISTORY — DX: Headache, unspecified: R51.9

## 2023-06-11 LAB — CBC
HCT: 36 % (ref 36.0–46.0)
Hemoglobin: 11.7 g/dL — ABNORMAL LOW (ref 12.0–15.0)
MCH: 31.1 pg (ref 26.0–34.0)
MCHC: 32.5 g/dL (ref 30.0–36.0)
MCV: 95.7 fL (ref 80.0–100.0)
Platelets: 169 10*3/uL (ref 150–400)
RBC: 3.76 MIL/uL — ABNORMAL LOW (ref 3.87–5.11)
RDW: 14.2 % (ref 11.5–15.5)
WBC: 11 10*3/uL — ABNORMAL HIGH (ref 4.0–10.5)
nRBC: 0 % (ref 0.0–0.2)

## 2023-06-11 LAB — BASIC METABOLIC PANEL
Anion gap: 10 (ref 5–15)
BUN: 23 mg/dL (ref 8–23)
CO2: 26 mmol/L (ref 22–32)
Calcium: 9.7 mg/dL (ref 8.9–10.3)
Chloride: 105 mmol/L (ref 98–111)
Creatinine, Ser: 1 mg/dL (ref 0.44–1.00)
GFR, Estimated: >60 mL/min (ref >60)
Glucose, Bld: 99 mg/dL (ref 70–99)
Potassium: 4.1 mmol/L (ref 3.5–5.1)
Sodium: 141 mmol/L (ref 135–145)

## 2023-06-11 NOTE — Patient Instructions (Addendum)
 SURGICAL WAITING ROOM VISITATION  Patients having surgery or a procedure may have no more than 2 support people in the waiting area - these visitors may rotate.    Children under the age of 99 must have an adult with them who is not the patient.  Due to an increase in RSV and influenza rates and associated hospitalizations, children ages 24 and under may not visit patients in [facility] hospitals.  If the patient needs to stay at the hospital during part of their recovery, the visitor guidelines for inpatient rooms apply. Pre-op nurse will coordinate an appropriate time for 1 support person to accompany patient in pre-op.  This support person may not rotate.    Please refer to the [facility] website for the visitor guidelines for Inpatients (after your surgery is over and you are in a regular room).    Your procedure is scheduled on: 06/12/23   Report to [facility] Main Entrance    Report to admitting at 10:40 AM   Call this number if you have problems the morning of surgery [phone]   Do not eat food or drink liquids :After Midnight.          If you have questions, please contact your surgeon's office.   FOLLOW BOWEL PREP AND ANY ADDITIONAL PRE OP INSTRUCTIONS YOU RECEIVED FROM YOUR SURGEON'S OFFICE!!!     Oral Hygiene is also important to reduce your risk of infection.                                    Remember - BRUSH YOUR TEETH THE MORNING OF SURGERY WITH YOUR REGULAR TOOTHPASTE  DENTURES WILL BE REMOVED PRIOR TO SURGERY PLEASE DO NOT APPLY Poly grip OR ADHESIVES!!!   Stop all vitamins and herbal supplements 7 days before surgery.   Take these medicines the morning of surgery with A SIP OF WATER : Clonazepam , Depakote , Norco, Vimpat , Omeprazole                                You may not have any metal on your body including hair pins, jewelry, and body piercing             Do not wear make-up, lotions, powders, perfumes, or deodorant  Do not wear nail  polish including gel and S&S, artificial/acrylic nails, or any other type of covering on natural nails including finger and toenails. If you have artificial nails, gel coating, etc. that needs to be removed by a nail salon please have this removed prior to surgery or surgery may need to be canceled/ delayed if the surgeon/ anesthesia feels like they are unable to be safely monitored.   Do not shave  48 hours prior to surgery.    Do not bring valuables to the hospital. [facility] IS NOT             RESPONSIBLE   FOR VALUABLES.   Contacts, glasses, dentures or bridgework may not be worn into surgery.  DO NOT BRING YOUR HOME MEDICATIONS TO THE HOSPITAL. PHARMACY WILL DISPENSE MEDICATIONS LISTED ON YOUR MEDICATION LIST TO YOU DURING YOUR ADMISSION IN THE HOSPITAL!    Patients discharged on the day of surgery will not be allowed to drive home.  Someone NEEDS to stay with you for the first 24 hours after anesthesia.   Special Instructions: Bring a  copy of your healthcare power of attorney and living will documents the day of surgery if you haven't scanned them before.              Please read over the following fact sheets you were given: IF YOU HAVE QUESTIONS ABOUT YOUR PRE-OP INSTRUCTIONS PLEASE CALL [phone]    If you received a COVID test during your pre-op visit  it is requested that you wear a mask when out in public, stay away from anyone that may not be feeling well and notify your surgeon if you develop symptoms. If you test positive for Covid or have been in contact with anyone that has tested positive in the last 10 days please notify you surgeon.    [facility] - Preparing for Surgery Before surgery, you can play an important role.  Because skin is not sterile, your skin needs to be as free of germs as possible.  You can reduce the number of germs on your skin by washing with CHG (chlorahexidine gluconate) soap before surgery.  CHG is an antiseptic cleaner which kills germs  and bonds with the skin to continue killing germs even after washing. Please DO NOT use if you have an allergy to CHG or antibacterial soaps.  If your skin becomes reddened/irritated stop using the CHG and inform your nurse when you arrive at Short Stay. Do not shave (including legs and underarms) for at least 48 hours prior to the first CHG shower.  You may shave your face/neck.  Please follow these instructions carefully:  1.  Shower with CHG Soap the night before surgery and the  morning of surgery.  2.  If you choose to wash your hair, wash your hair first as usual with your normal  shampoo.  3.  After you shampoo, rinse your hair and body thoroughly to remove the shampoo.                             4.  Use CHG as you would any other liquid soap.  You can apply chg directly to the skin and wash.  Gently with a scrungie or clean washcloth.  5.  Apply the CHG Soap to your body ONLY FROM THE NECK DOWN.   Do   not use on face/ open                           Wound or open sores. Avoid contact with eyes, ears mouth and   genitals (private parts).                       Wash face,  Genitals (private parts) with your normal soap.             6.  Wash thoroughly, paying special attention to the area where your    surgery  will be performed.  7.  Thoroughly rinse your body with warm water  from the neck down.  8.  DO NOT shower/wash with your normal soap after using and rinsing off the CHG Soap.                9.  Pat yourself dry with a clean towel.            10.  Wear clean pajamas.            11.  Place clean sheets on your bed  the night of your first shower and do not  sleep with pets. Day of Surgery : Do not apply any lotions/deodorants the morning of surgery.  Please wear clean clothes to the hospital/surgery center.  FAILURE TO FOLLOW THESE INSTRUCTIONS MAY RESULT IN THE CANCELLATION OF YOUR SURGERY  PATIENT SIGNATURE_________________________________  NURSE  SIGNATURE__________________________________  ________________________________________________________________________

## 2023-06-11 NOTE — H&P (Signed)
 PREOPERATIVE H&P  Chief Complaint: right proximal humerus fracture  HPI: Maria [name] is a 63 y.o. female who is scheduled for, Procedure(s): REVERSE SHOULDER ARTHROPLASTY OPEN REDUCTION INTERNAL FIXATION (ORIF) PROXIMAL HUMERUS FRACTURE.   Patient has a past medical history significant for hypertension, intermittent thrombocytopenia related to epilepsy medications and urinary incontinence.  .   Patient is a 63 year-old female coming in for evaluation of right shoulder pain after a fall.  Patient unfortunately had to present to the emergency room on the 29th of December after having a fall on Christmas day.  She was initially seen at an Urgent Care because of the pain and was reportedly told her x-rays were unremarkable.  She was discharged home that day with over the counter medications and icing.  Given the continued pain and swelling they decided to present to the emergency room at that time.  Further evaluation did reveal a proximal humerus fracture of the right shoulder.  She was subsequently given a sling and some narcotics for pain management and told to follow up with orthopedics.    Symptoms are rated as moderate to severe, and have been worsening.  This is significantly impairing activities of daily living.    Please see clinic note for further details on this patient's care.    She has elected for surgical management.   Past Medical History:  Diagnosis Date   Constipation    Dysphagia    Fracture    R foot   GERD (gastroesophageal reflux disease)    Headache    History of shingles 10/2016   Mental retardation    lesion in head   Pneumonia    Seizures (HCC)    Last seizure 05/31/23   Thrombocytopenia (HCC)    Tremor    Past Surgical History:  Procedure Laterality Date   BIOPSY  09/16/2014   Procedure: BIOPSY;  Surgeon: Claudis RAYMOND Rivet, MD;  Location: [facility];  Service: Endoscopy;;   CATARACT EXTRACTION     both eyes, May of 2015   COLONOSCOPY      COLONOSCOPY WITH PROPOFOL  N/A 11/20/2018   Procedure: COLONOSCOPY WITH PROPOFOL ;  Surgeon: Rivet Claudis RAYMOND, MD;  Location: [facility];  Service: Endoscopy;  Laterality: N/A;   COLONOSCOPY WITH PROPOFOL  N/A 08/07/2022   Procedure: COLONOSCOPY WITH PROPOFOL ;  Surgeon: Eartha Angelia Sieving, MD;  Location: [facility];  Service: Gastroenterology;  Laterality: N/A;  7:30am, asa 1-2   ESOPHAGEAL DILATION N/A 09/16/2014   Procedure: ESOPHAGEAL DILATION WITH 54FR MALONEY DILATOR;  Surgeon: Claudis RAYMOND Rivet, MD;  Location: [facility];  Service: Endoscopy;  Laterality: N/A;   ESOPHAGOGASTRODUODENOSCOPY (EGD) WITH PROPOFOL  N/A 09/16/2014   Procedure: ESOPHAGOGASTRODUODENOSCOPY (EGD) WITH PROPOFOL ;  Surgeon: Claudis RAYMOND Rivet, MD;  Location: [facility];  Service: Endoscopy;  Laterality: N/A;   HEMOSTASIS CLIP PLACEMENT  08/07/2022   Procedure: HEMOSTASIS CLIP PLACEMENT;  Surgeon: Eartha Angelia Sieving, MD;  Location: [facility];  Service: Gastroenterology;;   LAPAROSCOPIC APPENDECTOMY N/A 07/14/2020   Procedure: APPENDECTOMY LAPAROSCOPIC;  Surgeon: Kallie Manuelita BROCKS, MD;  Location: [facility];  Service: General;  Laterality: N/A;   MOUTH SURGERY     POLYPECTOMY  11/20/2018   Procedure: POLYPECTOMY;  Surgeon: Rivet Claudis RAYMOND, MD;  Location: [facility];  Service: Endoscopy;;  colon   POLYPECTOMY  08/07/2022   Procedure: POLYPECTOMY;  Surgeon: Eartha Angelia Sieving, MD;  Location: [facility];  Service: Gastroenterology;;   Skin graft to gum Right 08/2013  SUBMUCOSAL LIFTING INJECTION  08/07/2022   Procedure: SUBMUCOSAL LIFTING INJECTION;  Surgeon: Eartha Flavors, Toribio, MD;  Location: [facility];  Service: Gastroenterology;;   TONSILLECTOMY AND ADENOIDECTOMY     TOTAL ABDOMINAL HYSTERECTOMY     Social History   Socioeconomic History   Marital status: Single    Spouse name: Not on file   Number of children: 0   Years of education: 10   Highest education level: Not on file  Occupational  History    Employer: UNEMPLOYED  Tobacco Use   Smoking status: Former    Current packs/day: 0.00    Average packs/day: 1.5 packs/day for 15.0 years (22.5 ttl pk-yrs)    Types: Cigarettes    Start date: 05/23/1991    Quit date: 05/22/2006    Years since quitting: 17.0   Smokeless tobacco: Never  Vaping Use   Vaping status: Never Used  Substance and Sexual Activity   Alcohol use: No    Alcohol/week: 0.0 standard drinks of alcohol   Drug use: No   Sexual activity: Never    Birth control/protection: Other-see comments    Comment: Hysterectomy  Other Topics Concern   Not on file  Social History Narrative   Patient is single and lives with her mother Rosezella)      Patient drinks 3 cups of caffeine daily.   Patient is right handed.         Social Drivers of Corporate Investment Banker Strain: Not on file  Food Insecurity: Not on file  Transportation Needs: Not on file  Physical Activity: Not on file  Stress: Not on file  Social Connections: Not on file   Family History  Problem Relation Age of Onset   High Cholesterol Mother    High blood pressure Mother    Diabetes Father    Allergies  Allergen Reactions   Codeine Nausea And Vomiting   Lamictal [Lamotrigine] Rash   Prior to Admission medications   Medication Sig Start Date End Date Taking? Authorizing Provider  atorvastatin  (LIPITOR) 20 MG tablet Take 20 mg by mouth at bedtime. 08/24/18  Yes [provider]  Calcium  Carb-Cholecalciferol (CALCIUM /VITAMIN D  PO) Take 1 tablet by mouth 2 (two) times daily. 600 mg / 800 units   Yes [provider]  calcium  carbonate (TUMS - DOSED IN MG ELEMENTAL CALCIUM ) 500 MG chewable tablet Chew 1 tablet by mouth daily as needed for indigestion or heartburn.   Yes [provider]  cholecalciferol (VITAMIN D3) 25 MCG (1000 UNIT) tablet Take 1,000 Units by mouth daily.   Yes [provider]  clonazePAM  (KLONOPIN ) 0.5 MG tablet Take 1 tablet (0.5 mg total)  by mouth 2 (two) times daily. 04/28/23  Yes Gayland Lauraine PARAS, NP  divalproex  (DEPAKOTE ) 500 MG DR tablet TAKE 2 TABLETS (1,000 MG TOTAL) BY MOUTH DAILY. Patient taking differently: Take 500 mg by mouth 2 (two) times daily. 07/10/22 07/10/23 Yes Gayland Lauraine PARAS, NP  HYDROcodone -acetaminophen  (NORCO/VICODIN) 5-325 MG tablet Take 1 tablet by mouth every 6 (six) hours as needed for moderate pain (pain score 4-6). 06/02/23  Yes Margrette Taft BRAVO, MD  lacosamide  (VIMPAT ) 200 MG TABS tablet TAKE 1 TABLET TWICE A DAY 01/21/23  Yes Gayland Lauraine PARAS, NP  Multiple Vitamin (MULTIVITAMIN) capsule Take 1 capsule by mouth daily.   Yes [provider]  omeprazole  (PRILOSEC) 40 MG capsule Take 1 capsule (40 mg total) by mouth daily. 05/06/23  Yes Eartha Flavors Toribio, MD  senna (SENOKOT) 8.6  MG tablet Take 2 tablets by mouth daily.   Yes [provider]  ibuprofen  (ADVIL ) 800 MG tablet Take 1 tablet (800 mg total) by mouth every 8 (eight) hours as needed. Patient not taking: Reported on 06/09/2023 06/02/23   Margrette Taft BRAVO, MD  oxyCODONE -acetaminophen  (PERCOCET/ROXICET) 5-325 MG tablet Take 1 tablet by mouth every 6 (six) hours as needed for severe pain (pain score 7-10). Patient not taking: Reported on 06/09/2023 05/31/23   Cleotilde Rogue, MD  tiZANidine  (ZANAFLEX ) 4 MG tablet Take 1 tablet (4 mg total) by mouth every 8 (eight) hours as needed for muscle spasms. Do not take with alcohol or while driving or operating heavy machinery.  May cause drowsiness. Patient not taking: Reported on 06/09/2023 11/12/22   Chandra Harlene LABOR, NP  Vibegron  75 MG TABS Take 1 tablet (75 mg total) by mouth daily. Patient not taking: Reported on 06/09/2023 03/24/23   Matilda Senior, MD  QUEtiapine  (SEROQUEL ) 25 MG tablet Take 1 tablet (25 mg total) by mouth 2 (two) times daily. 05/31/20 06/06/20  Angiulli, Toribio PARAS, PA-C    ROS: All other systems have been reviewed and were otherwise negative with the exception of those  mentioned in the HPI and as above.  Physical Exam: General: Alert, no acute distress Cardiovascular: No pedal edema Respiratory: No cyanosis, no use of accessory musculature GI: No organomegaly, abdomen is soft and non-tender Skin: No lesions in the area of chief complaint Neurologic: Sensation intact distally Psychiatric: Patient is competent for consent with normal mood and affect Lymphatic: No axillary or cervical lymphadenopathy  MUSCULOSKELETAL:  She is grossly tender to palpation of the right upper extremity.  She does have a resolving ecchymosis present.  She is neurovascularly intact distally  Imaging: X-rays and CT were reviewed that were obtained in the emergency room which demonstrate comminuted proximal humerus fracture.    Assessment: right proximal humerus fracture  Plan: Plan for Procedure(s): REVERSE SHOULDER ARTHROPLASTY OPEN REDUCTION INTERNAL FIXATION (ORIF) PROXIMAL HUMERUS FRACTURE  The risks benefits and alternatives were discussed with the patient including but not limited to the risks of nonoperative treatment, versus surgical intervention including infection, bleeding, nerve injury,  blood clots, cardiopulmonary complications, morbidity, mortality, among others, and they were willing to proceed.   The patient acknowledged the explanation, agreed to proceed with the plan and consent was signed.   Operative Plan: Right reverse total shoulder arthroplasty for fracutre Discharge Medications: hydrocodone  DVT Prophylaxis: aspirin  Physical Therapy: delayed therapy Special Discharge needs: regular sling, rosalita Aleck LOISE Jennye, PA-C  06/11/2023 8:08 PM

## 2023-06-11 NOTE — Progress Notes (Addendum)
 Patient has intellectual disability. Her brother is present with today and is her care giver. He signed consent forms. Stated she does not have a legal guardian.  COVID Vaccine Completed: yes  Date of COVID positive in last 90 days:  PCP - Norleen Hurst, MD Cardiologist - n/a Neurologist- Lauraine Born, NP  Chest x-ray - n/a EKG - 06/11/23 Epic/chart Stress Test - n/a ECHO - 04/17/20 Epic Cardiac Cath - n/a Pacemaker/ICD device last checked: n/a Spinal Cord Stimulator: n/a  Bowel Prep - no  Sleep Study - n/a CPAP -   Fasting Blood Sugar - n/a Checks Blood Sugar _____ times a day  Last dose of GLP1 agonist-  N/A GLP1 instructions:  Hold 7 days before surgery    Last dose of SGLT-2 inhibitors-  N/A SGLT-2 instructions:  Hold 3 days before surgery    Blood Thinner Instructions: n/a Aspirin  Instructions: Last Dose:  Activity level: Can go up a flight of stairs and perform activities of daily living without stopping and without symptoms of chest pain or shortness of breath.   Anesthesia review: heart failure, seizure disorder, thrombocytopenia, anemia, dysphagia  Patient denies shortness of breath, fever, cough and chest pain at PAT appointment  Patient verbalized understanding of instructions that were given to them at the PAT appointment. Patient was also instructed that they will need to review over the PAT instructions again at home before surgery.

## 2023-06-12 ENCOUNTER — Ambulatory Visit (HOSPITAL_COMMUNITY): Payer: Medicare Other | Admitting: Anesthesiology

## 2023-06-12 ENCOUNTER — Ambulatory Visit (HOSPITAL_COMMUNITY): Payer: Medicare Other | Admitting: Physician Assistant

## 2023-06-12 ENCOUNTER — Encounter (HOSPITAL_COMMUNITY)
Admission: RE | Disposition: A | Discharge status: Home / Self Care | Payer: Self-pay | Source: Ambulatory Visit | Attending: Orthopaedic Surgery

## 2023-06-12 ENCOUNTER — Ambulatory Visit (HOSPITAL_COMMUNITY)
Admission: RE | Admit: 2023-06-12 | Discharge: 2023-06-12 | Disposition: A | Discharge status: Home / Self Care | Payer: Medicare Other | Source: Ambulatory Visit | Attending: Orthopaedic Surgery | Admitting: Orthopaedic Surgery

## 2023-06-12 ENCOUNTER — Ambulatory Visit (HOSPITAL_COMMUNITY): Payer: Medicare Other

## 2023-06-12 ENCOUNTER — Encounter (HOSPITAL_COMMUNITY): Payer: Self-pay | Admitting: Orthopaedic Surgery

## 2023-06-12 DIAGNOSIS — S42201A Unspecified fracture of upper end of right humerus, initial encounter for closed fracture: Secondary | ICD-10-CM | POA: Diagnosis not present

## 2023-06-12 DIAGNOSIS — D649 Anemia, unspecified: Secondary | ICD-10-CM | POA: Diagnosis not present

## 2023-06-12 DIAGNOSIS — K219 Gastro-esophageal reflux disease without esophagitis: Secondary | ICD-10-CM | POA: Diagnosis not present

## 2023-06-12 DIAGNOSIS — Z87891 Personal history of nicotine dependence: Secondary | ICD-10-CM | POA: Insufficient documentation

## 2023-06-12 DIAGNOSIS — E785 Hyperlipidemia, unspecified: Secondary | ICD-10-CM | POA: Diagnosis not present

## 2023-06-12 DIAGNOSIS — Z96611 Presence of right artificial shoulder joint: Secondary | ICD-10-CM | POA: Diagnosis not present

## 2023-06-12 DIAGNOSIS — W19XXXA Unspecified fall, initial encounter: Secondary | ICD-10-CM | POA: Insufficient documentation

## 2023-06-12 DIAGNOSIS — F419 Anxiety disorder, unspecified: Secondary | ICD-10-CM | POA: Insufficient documentation

## 2023-06-12 DIAGNOSIS — G8918 Other acute postprocedural pain: Secondary | ICD-10-CM | POA: Diagnosis not present

## 2023-06-12 DIAGNOSIS — I340 Nonrheumatic mitral (valve) insufficiency: Secondary | ICD-10-CM | POA: Diagnosis not present

## 2023-06-12 DIAGNOSIS — S42291A Other displaced fracture of upper end of right humerus, initial encounter for closed fracture: Secondary | ICD-10-CM | POA: Diagnosis not present

## 2023-06-12 DIAGNOSIS — J189 Pneumonia, unspecified organism: Secondary | ICD-10-CM

## 2023-06-12 DIAGNOSIS — R569 Unspecified convulsions: Secondary | ICD-10-CM | POA: Diagnosis not present

## 2023-06-12 DIAGNOSIS — R519 Headache, unspecified: Secondary | ICD-10-CM | POA: Insufficient documentation

## 2023-06-12 HISTORY — PX: REVERSE SHOULDER ARTHROPLASTY: SHX5054

## 2023-06-12 SURGERY — ARTHROPLASTY, SHOULDER, TOTAL, REVERSE
Anesthesia: Regional | Site: Shoulder | Laterality: Right

## 2023-06-12 MED ORDER — LIDOCAINE HCL (PF) 2 % IJ SOLN
INTRAMUSCULAR | Status: AC
  Filled 2023-06-12: qty 5

## 2023-06-12 MED ORDER — FENTANYL CITRATE PF 50 MCG/ML IJ SOSY: 25.0000 ug | PREFILLED_SYRINGE | INTRAMUSCULAR | Status: DC | PRN

## 2023-06-12 MED ORDER — SUGAMMADEX SODIUM 200 MG/2ML IV SOLN
INTRAVENOUS | Status: DC | PRN
  Administered 2023-06-12: 200 mg via INTRAVENOUS

## 2023-06-12 MED ORDER — CHLORHEXIDINE GLUCONATE 0.12 % MT SOLN
15.0000 mL | Freq: Once | OROMUCOSAL | Status: AC
  Administered 2023-06-12: 15 mL via OROMUCOSAL

## 2023-06-12 MED ORDER — DEXAMETHASONE SODIUM PHOSPHATE 10 MG/ML IJ SOLN
INTRAMUSCULAR | Status: AC
  Filled 2023-06-12: qty 1

## 2023-06-12 MED ORDER — BUPIVACAINE HCL 0.25 % IJ SOLN
INTRAMUSCULAR | Status: AC
  Filled 2023-06-12: qty 1

## 2023-06-12 MED ORDER — PROPOFOL 1000 MG/100ML IV EMUL
INTRAVENOUS | Status: AC
  Filled 2023-06-12: qty 100

## 2023-06-12 MED ORDER — ONDANSETRON HCL 4 MG/2ML IJ SOLN
INTRAMUSCULAR | Status: AC
  Filled 2023-06-12: qty 2

## 2023-06-12 MED ORDER — MIDAZOLAM HCL 2 MG/2ML IJ SOLN
INTRAMUSCULAR | Status: AC
  Filled 2023-06-12: qty 2

## 2023-06-12 MED ORDER — CEFAZOLIN SODIUM-DEXTROSE 2-4 GM/100ML-% IV SOLN
2.0000 g | INTRAVENOUS | Status: AC
  Administered 2023-06-12: 2 g via INTRAVENOUS
  Filled 2023-06-12: qty 100

## 2023-06-12 MED ORDER — SODIUM CHLORIDE 0.9 % IR SOLN
Status: DC | PRN
  Administered 2023-06-12: 1000 mL

## 2023-06-12 MED ORDER — ONDANSETRON HCL 4 MG/2ML IJ SOLN
INTRAMUSCULAR | Status: DC | PRN
  Administered 2023-06-12: 4 mg via INTRAVENOUS

## 2023-06-12 MED ORDER — PROPOFOL 10 MG/ML IV BOLUS
INTRAVENOUS | Status: DC | PRN
  Administered 2023-06-12: 120 mg via INTRAVENOUS

## 2023-06-12 MED ORDER — ROCURONIUM BROMIDE 100 MG/10ML IV SOLN
INTRAVENOUS | Status: DC | PRN
  Administered 2023-06-12: 35 mg via INTRAVENOUS

## 2023-06-12 MED ORDER — BUPIVACAINE LIPOSOME 1.3 % IJ SUSP
INTRAMUSCULAR | Status: DC | PRN
  Administered 2023-06-12: 10 mL

## 2023-06-12 MED ORDER — DROPERIDOL 2.5 MG/ML IJ SOLN: 0.6250 mg | Freq: Once | INTRAMUSCULAR | Status: DC | PRN

## 2023-06-12 MED ORDER — TRANEXAMIC ACID-NACL 1000-0.7 MG/100ML-% IV SOLN
1000.0000 mg | INTRAVENOUS | Status: AC
  Administered 2023-06-12: 1000 mg via INTRAVENOUS
  Filled 2023-06-12: qty 100

## 2023-06-12 MED ORDER — VANCOMYCIN HCL 1 G IV SOLR
INTRAVENOUS | Status: DC | PRN
  Administered 2023-06-12: 1000 mg

## 2023-06-12 MED ORDER — FENTANYL CITRATE PF 50 MCG/ML IJ SOSY
50.0000 ug | PREFILLED_SYRINGE | INTRAMUSCULAR | Status: DC
  Administered 2023-06-12: 50 ug via INTRAVENOUS
  Filled 2023-06-12: qty 2

## 2023-06-12 MED ORDER — DEXAMETHASONE SODIUM PHOSPHATE 4 MG/ML IJ SOLN
INTRAMUSCULAR | Status: DC | PRN
  Administered 2023-06-12: 5 mg via INTRAVENOUS

## 2023-06-12 MED ORDER — PROPOFOL 10 MG/ML IV BOLUS
INTRAVENOUS | Status: AC
  Filled 2023-06-12: qty 20

## 2023-06-12 MED ORDER — GABAPENTIN 300 MG PO CAPS
300.0000 mg | ORAL_CAPSULE | Freq: Once | ORAL | Status: DC
  Filled 2023-06-12: qty 1

## 2023-06-12 MED ORDER — LACTATED RINGERS IV SOLN
INTRAVENOUS | Status: DC
Start: 2023-06-12 — End: 2023-06-12

## 2023-06-12 MED ORDER — BUPIVACAINE HCL (PF) 0.5 % IJ SOLN
INTRAMUSCULAR | Status: DC | PRN
  Administered 2023-06-12: 15 mL via PERINEURAL

## 2023-06-12 MED ORDER — 0.9 % SODIUM CHLORIDE (POUR BTL) OPTIME
TOPICAL | Status: DC | PRN
  Administered 2023-06-12: 1000 mL

## 2023-06-12 MED ORDER — FENTANYL CITRATE (PF) 100 MCG/2ML IJ SOLN
INTRAMUSCULAR | Status: AC
  Filled 2023-06-12: qty 2

## 2023-06-12 MED ORDER — ACETAMINOPHEN 500 MG PO TABS: 500.0000 mg | ORAL_TABLET | Freq: Three times a day (TID) | ORAL | 0 refills | Status: AC

## 2023-06-12 MED ORDER — HYDROCODONE-ACETAMINOPHEN 5-325 MG PO TABS: 1.0000 | ORAL_TABLET | Freq: Four times a day (QID) | ORAL | 0 refills | Status: DC | PRN

## 2023-06-12 MED ORDER — CELECOXIB 100 MG PO CAPS: 100.0000 mg | ORAL_CAPSULE | Freq: Two times a day (BID) | ORAL | 0 refills | Status: DC

## 2023-06-12 MED ORDER — ASPIRIN 81 MG PO CHEW: 81.0000 mg | CHEWABLE_TABLET | Freq: Two times a day (BID) | ORAL | 0 refills | Status: DC

## 2023-06-12 MED ORDER — MIDAZOLAM HCL 2 MG/2ML IJ SOLN
1.0000 mg | INTRAMUSCULAR | Status: DC
  Administered 2023-06-12: 2 mg via INTRAVENOUS
  Filled 2023-06-12: qty 2

## 2023-06-12 MED ORDER — VANCOMYCIN HCL 1000 MG IV SOLR
INTRAVENOUS | Status: AC
  Filled 2023-06-12: qty 20

## 2023-06-12 MED ORDER — ORAL CARE MOUTH RINSE: 15.0000 mL | Freq: Once | OROMUCOSAL | Status: AC

## 2023-06-12 MED ORDER — EPHEDRINE SULFATE (PRESSORS) 50 MG/ML IJ SOLN
INTRAMUSCULAR | Status: DC | PRN
  Administered 2023-06-12: 5 mg via INTRAVENOUS
  Administered 2023-06-12: 10 mg via INTRAVENOUS

## 2023-06-12 MED ORDER — LIDOCAINE HCL (CARDIAC) PF 100 MG/5ML IV SOSY
PREFILLED_SYRINGE | INTRAVENOUS | Status: DC | PRN
  Administered 2023-06-12: 100 mg via INTRAVENOUS

## 2023-06-12 MED ORDER — STERILE WATER FOR IRRIGATION IR SOLN
Status: DC | PRN
  Administered 2023-06-12: 1000 mL

## 2023-06-12 MED ORDER — ACETAMINOPHEN 500 MG PO TABS
1000.0000 mg | ORAL_TABLET | Freq: Once | ORAL | Status: AC
  Administered 2023-06-12: 1000 mg via ORAL
  Filled 2023-06-12: qty 2

## 2023-06-12 SURGICAL SUPPLY — 74 items
BAG COUNTER SPONGE SURGICOUNT (BAG) ×3 IMPLANT
BASEPLATE GLENOID RSA 3X25 0D (Shoulder) ×1 IMPLANT
BIT DRILL 3.2 PERIPHERAL SCREW (BIT) ×1 IMPLANT
BLADE SAW SGTL 73X25 THK (BLADE) ×3 IMPLANT
CHLORAPREP W/TINT 26 (MISCELLANEOUS) ×6 IMPLANT
CLSR STERI-STRIP ANTIMIC 1/2X4 (GAUZE/BANDAGES/DRESSINGS) ×3 IMPLANT
COOLER ICEMAN CLASSIC (MISCELLANEOUS) IMPLANT
COVER BACK TABLE 60X90IN (DRAPES) ×3 IMPLANT
COVER SURGICAL LIGHT HANDLE (MISCELLANEOUS) ×3 IMPLANT
CUP HUM REV INSERT SZ3/4 36 (Orthopedic Implant) ×1 IMPLANT
DRAPE C-ARM 42X120 X-RAY (DRAPES) ×2 IMPLANT
DRAPE INCISE IOBAN 66X45 STRL (DRAPES) ×3 IMPLANT
DRAPE POUCH INSTRU U-SHP 10X18 (DRAPES) ×3 IMPLANT
DRAPE SHEET LG 3/4 BI-LAMINATE (DRAPES) ×6 IMPLANT
DRAPE SURG ORHT 6 SPLT 77X108 (DRAPES) ×6 IMPLANT
DRAPE TOP 10253 STERILE (DRAPES) ×3 IMPLANT
DRSG AQUACEL AG ADV 3.5X 4 (GAUZE/BANDAGES/DRESSINGS) ×6 IMPLANT
DRSG AQUACEL AG ADV 3.5X 6 (GAUZE/BANDAGES/DRESSINGS) ×3 IMPLANT
DRSG AQUACEL AG ADV 3.5X10 (GAUZE/BANDAGES/DRESSINGS) ×1 IMPLANT
DRSG MEPILEX POST OP 4X8 (GAUZE/BANDAGES/DRESSINGS) ×3 IMPLANT
DURAPREP 26ML APPLICATOR (WOUND CARE) ×3 IMPLANT
ELECT BLADE TIP CTD 4 INCH (ELECTRODE) ×3 IMPLANT
ELECT PENCIL ROCKER SW 15FT (MISCELLANEOUS) IMPLANT
ELECT REM PT RETURN 15FT ADLT (MISCELLANEOUS) ×3 IMPLANT
FACESHIELD WRAPAROUND (MASK) ×8
FACESHIELD WRAPAROUND OR TEAM (MASK) ×6 IMPLANT
GAUZE XEROFORM 1X8 LF (GAUZE/BANDAGES/DRESSINGS) ×1 IMPLANT
GLOVE BIO SURGEON STRL SZ 6.5 (GLOVE) ×6 IMPLANT
GLOVE BIO SURGEON STRL SZ8 (GLOVE) ×6 IMPLANT
GLOVE BIOGEL PI IND STRL 6.5 (GLOVE) ×3 IMPLANT
GLOVE BIOGEL PI IND STRL 8 (GLOVE) ×3 IMPLANT
GLOVE ECLIPSE 8.0 STRL XLNG CF (GLOVE) ×6 IMPLANT
GOWN STRL REUS W/ TWL LRG LVL3 (GOWN DISPOSABLE) ×6 IMPLANT
GUIDEPIN HUM 3X100 (PIN) ×1 IMPLANT
GUIDEWIRE GLENOID 2.5X220 (WIRE) ×1 IMPLANT
KIT BASIN OR (CUSTOM PROCEDURE TRAY) ×3 IMPLANT
KIT STABILIZATION SHOULDER (MISCELLANEOUS) ×3 IMPLANT
KIT TURNOVER KIT A (KITS) IMPLANT
MANIFOLD NEPTUNE II (INSTRUMENTS) ×3 IMPLANT
NS IRRIG 1000ML POUR BTL (IV SOLUTION) ×3 IMPLANT
PACK SHOULDER (CUSTOM PROCEDURE TRAY) ×3 IMPLANT
PAD COLD SHLDR WRAP-ON (PAD) ×1 IMPLANT
RESTRAINT HEAD UNIVERSAL NS (MISCELLANEOUS) ×3 IMPLANT
SCREW 5.5X22 (Screw) ×1 IMPLANT
SCREW BONE 6.5X40 SM (Screw) ×1 IMPLANT
SCREW PERIPHERAL 30 (Screw) ×1 IMPLANT
SET HNDPC FAN SPRY TIP SCT (DISPOSABLE) ×3 IMPLANT
SLING ARM FOAM STRAP LRG (SOFTGOODS) ×3 IMPLANT
SLING ARM FOAM STRAP MED (SOFTGOODS) ×1 IMPLANT
SLING ARM FOAM STRAP XLG (SOFTGOODS) IMPLANT
SLING ARM IMMOBILIZER LRG (SOFTGOODS) IMPLANT
SLING ARM IMMOBILIZER MED (SOFTGOODS) IMPLANT
SPHERE GLENOID LAT REV 36 (Joint) ×1 IMPLANT
SPIKE FLUID TRANSFER (MISCELLANEOUS) IMPLANT
SPONGE T-LAP 4X18 ~~LOC~~+RFID (SPONGE) ×3 IMPLANT
STAPLER SKIN PROX WIDE 3.9 (STAPLE) IMPLANT
STEM PERFORM FX 130 12L (Stem) ×1 IMPLANT
STRIP CLOSURE SKIN 1/2X4 (GAUZE/BANDAGES/DRESSINGS) ×3 IMPLANT
SUCTION TUBE FRAZIER 12FR DISP (SUCTIONS) ×1 IMPLANT
SUT ETHIBOND 2 V 37 (SUTURE) ×3 IMPLANT
SUT ETHIBOND NAB CT1 #1 30IN (SUTURE) ×3 IMPLANT
SUT ETHILON 3 0 PS 1 (SUTURE) IMPLANT
SUT FIBERWIRE #2 38 T-5 BLUE (SUTURE) ×4
SUT FIBERWIRE #5 38 CONV NDL (SUTURE) ×4
SUT MNCRL AB 4-0 PS2 18 (SUTURE) ×3 IMPLANT
SUT VIC AB 0 CT1 27XBRD ANBCTR (SUTURE) ×3 IMPLANT
SUT VIC AB 0 CT1 36 (SUTURE) IMPLANT
SUT VIC AB 2-0 SH 27XBRD (SUTURE) IMPLANT
SUT VIC AB 3-0 SH 27X BRD (SUTURE) ×3 IMPLANT
SUTURE FIBERWR #2 38 T-5 BLUE (SUTURE) ×4 IMPLANT
SUTURE FIBERWR #5 38 CONV NDL (SUTURE) ×4 IMPLANT
TOWEL OR 17X26 10 PK STRL BLUE (TOWEL DISPOSABLE) ×3 IMPLANT
TUBE SUCTION HIGH CAP CLEAR NV (SUCTIONS) ×3 IMPLANT
WATER STERILE IRR 1000ML POUR (IV SOLUTION) ×6 IMPLANT

## 2023-06-12 NOTE — Discharge Instructions (Addendum)
 Bonner Hair MD, MPH Aleck Stalling, PA-C [facility] [address] [phone] (tel)   [phone] (fax)   POST-OPERATIVE INSTRUCTIONS - TOTAL SHOULDER REPLACEMENT    WOUND CARE You may leave the operative dressing in place until your follow-up appointment. KEEP THE INCISIONS CLEAN AND DRY. There may be a small amount of fluid/bleeding leaking at the surgical site. This is normal after surgery.  If it fills with liquid or blood please call us  immediately to change it for you. Use the provided ice machine or Ice packs as often as possible for the first 3-4 days, then as needed for pain relief.   Keep a layer of cloth or a shirt between your skin and the cooling unit to prevent frost bite as it can get very cold.  SHOWERING: - You may shower on Post-Op Day #2.  - The dressing is water  resistant but do not scrub it as it may start to peel up.   - You may remove the sling for showering - Gently pat the area dry.  - Do not soak the shoulder in water .  - Do not go swimming in the pool or ocean until your incision has completely healed (about 4-6 weeks after surgery) - KEEP THE INCISIONS CLEAN AND DRY.  EXERCISES Wear the sling at all times  You may remove the sling for showering, but keep the arm across the chest or in a secondary sling.    Accidental/Purposeful External Rotation and shoulder flexion (reaching behind you) is to be avoided at all costs for the first month. It is ok to come out of your sling if your are sitting and have assistance for eating.   Do not lift anything heavier than 1 pound until we discuss it further in clinic.  It is normal for your fingers/hand to become more swollen after surgery and discolored from bruising.   This will resolve over the first few weeks usually after surgery. Please continue to ambulate and do not stay sitting or lying for too long.  Perform foot and wrist pumps to assist in circulation.  PHYSICAL  THERAPY - No therapy for 4 weeks after surgery  REGIONAL ANESTHESIA (NERVE BLOCKS) The anesthesia team may have performed a nerve block for you this is a great tool used to minimize pain.   The block may start wearing off overnight (between 8-24 hours postop) When the block wears off, your pain may go from nearly zero to the pain you would have had postop without the block. This is an abrupt transition but nothing dangerous is happening.   This can be a challenging period but utilize your as needed pain medications to try and manage this period. We suggest you use the pain medication the first night prior to going to bed, to ease this transition.  You may take an extra dose of narcotic when this happens if needed   POST-OP MEDICATIONS- Multimodal approach to pain control In general your pain will be controlled with a combination of substances.  Prescriptions unless otherwise discussed are electronically sent to your pharmacy.  This is a carefully made plan we use to minimize narcotic use.     Celebrex  - Anti-inflammatory medication taken on a scheduled basis Acetaminophen  - Non-narcotic pain medicine taken on a scheduled basis  Your pain medication has tylenol  (acetaminophen ) in it Do not take more than 3,500 mg of Tylenol  daily Hydrocodone -acetaminophen  (Norco) - This is a strong narcotic, to be used only on an "as  needed" basis for SEVERE pain. Aspirin  81mg  - This medicine is used to minimize the risk of blood clots after surgery.    FOLLOW-UP If you develop a Fever (>101.5), Redness or Drainage from the surgical incision site, please call our office to arrange for an evaluation. Please call the office to schedule a follow-up appointment for a wound check, 7-10 days post-operatively.  IF YOU HAVE ANY QUESTIONS, PLEASE FEEL FREE TO CALL OUR OFFICE.  HELPFUL INFORMATION  Your arm will be in a sling following surgery. You will be in this sling for the next 4 weeks.   You may be more  comfortable sleeping in a semi-seated position the first few nights following surgery.  Keep a pillow propped under the elbow and forearm for comfort.  If you have a recliner type of chair it might be beneficial.  If not that is fine too, but it would be helpful to sleep propped up with pillows behind your operated shoulder as well under your elbow and forearm.  This will reduce pulling on the suture lines.  When dressing, put your operative arm in the sleeve first.  When getting undressed, take your operative arm out last.  Loose fitting, button-down shirts are recommended.  In most states it is against the law to drive while your arm is in a sling. And certainly against the law to drive while taking narcotics.  You may return to work/school in the next couple of days when you feel up to it. Desk work and typing in the sling is fine.  We suggest you use the pain medication the first night prior to going to bed, in order to ease any pain when the anesthesia wears off. You should avoid taking pain medications on an empty stomach as it will make you nauseous.  You should wean off your narcotic medicines as soon as you are able.     Most patients will be off narcotics before their first postop appointment.   Do not drink alcoholic beverages or take illicit drugs when taking pain medications.  Pain medication may make you constipated.  Below are a few solutions to try in this order: Decrease the amount of pain medication if you aren't having pain. Drink lots of decaffeinated fluids. Drink prune juice and/or each dried prunes  If the first 3 don't work start with additional solutions Take Colace - an over-the-counter stool softener Take Senokot - an over-the-counter laxative Take Miralax  - a stronger over-the-counter laxative   Dental Antibiotics:  In most cases prophylactic antibiotics for Dental procdeures after total joint surgery are not necessary.  Exceptions are as follows:  1.  History of prior total joint infection  2. Severely immunocompromised (Organ Transplant, cancer chemotherapy, Rheumatoid biologic meds such as Humira)  3. Poorly controlled diabetes (A1C &gt; 8.0, blood glucose over 200)  If you have one of these conditions, contact your surgeon for an antibiotic prescription, prior to your dental procedure.   For more information including helpful videos and documents visit our website:   [email or web address]

## 2023-06-12 NOTE — Transfer of Care (Signed)
 Immediate Anesthesia Transfer of Care Note  Patient: Maria [name]  Procedure(s) Performed: REVERSE SHOULDER ARTHROPLASTY (Right: Shoulder)  Patient Location: PACU  Anesthesia Type:General  Level of Consciousness: patient cooperative  Airway & Oxygen  Therapy: Patient Spontanous Breathing and Patient connected to face mask oxygen   Post-op Assessment: Report given to RN and Post -op Vital signs reviewed and stable  Post vital signs: Reviewed and stable  Last Vitals:  Vitals Value Taken Time  BP 129/57 06/12/23 1235  Temp    Pulse 52 06/12/23 1239  Resp 11 06/12/23 1239  SpO2 100 % 06/12/23 1239  Vitals shown include unfiled device data.  Last Pain:  Vitals:   06/12/23 1235  TempSrc:   PainSc: Asleep         Complications: No notable events documented.

## 2023-06-12 NOTE — Anesthesia Postprocedure Evaluation (Signed)
 Anesthesia Post Note  Patient: [name]  Procedure(s) Performed: REVERSE SHOULDER ARTHROPLASTY (Right: Shoulder)     Patient location during evaluation: PACU Anesthesia Type: Regional and General Level of consciousness: awake and alert Pain management: pain level controlled Vital Signs Assessment: post-procedure vital signs reviewed and stable Respiratory status: spontaneous breathing, nonlabored ventilation, respiratory function stable and patient connected to nasal cannula oxygen  Cardiovascular status: blood pressure returned to baseline and stable Postop Assessment: no apparent nausea or vomiting Anesthetic complications: no   No notable events documented.  Last Vitals:  Vitals:   06/12/23 1300 06/12/23 1315  BP: (!) 147/70 (!) 149/88  Pulse: 67 (!) 56  Resp: 13 14  Temp:    SpO2: 95% 94%    Last Pain:  Vitals:   06/12/23 1315  TempSrc:   PainSc: Asleep                 Tiffanee Mcnee

## 2023-06-12 NOTE — Anesthesia Preprocedure Evaluation (Addendum)
 Anesthesia Evaluation  Patient identified by MRN, date of birth, ID band Patient awake    Reviewed: Allergy & Precautions, H&P , NPO status , Patient's Chart, lab work & pertinent test results, reviewed documented beta blocker date and time   Airway Mallampati: II  TM Distance: >3 FB Neck ROM: full    Dental no notable dental hx. (+) Teeth Intact, Dental Advisory Given   Pulmonary pneumonia, former smoker   Pulmonary exam normal breath sounds clear to auscultation       Cardiovascular Exercise Tolerance: Good negative cardio ROS  Rhythm:regular Rate:Normal  ECHO 21 1. Left ventricular ejection fraction, by estimation, is 60 to 65%. The  left ventricle has normal function. The left ventricle has no regional  wall motion abnormalities. Left ventricular diastolic parameters are  consistent with Grade II diastolic  dysfunction (pseudonormalization).   2. Right ventricular systolic function is normal. The right ventricular  size is normal. There is normal pulmonary artery systolic pressure.   3. The pericardial effusion is circumferential.   4. The mitral valve is normal in structure. Mild mitral valve  regurgitation. No evidence of mitral stenosis.   5. The aortic valve is normal in structure. Aortic valve regurgitation is  not visualized. No aortic stenosis is present.   6. The inferior vena cava is normal in size with greater than 50%  respiratory variability, suggesting right atrial pressure of 3 mmHg.      Neuro/Psych  Headaches, Seizures -, Poorly Controlled,   Anxiety     negative neurological ROS  negative psych ROS   GI/Hepatic negative GI ROS, Neg liver ROS,GERD  Medicated,,  Endo/Other  negative endocrine ROSType 2    Renal/GU Renal diseasenegative Renal ROS  negative genitourinary   Musculoskeletal   Abdominal   Peds  Hematology negative hematology ROS (+) Blood dyscrasia, anemia   Anesthesia Other  Findings   Reproductive/Obstetrics negative OB ROS                             Anesthesia Physical Anesthesia Plan  ASA: 3  Anesthesia Plan: General and Regional   Post-op Pain Management: Regional block*, Minimal or no pain anticipated, Tylenol  PO (pre-op)* and Celebrex  PO (pre-op)*   Induction: Intravenous  PONV Risk Score and Plan: 3 and Ondansetron  and Dexamethasone   Airway Management Planned: Oral ETT  Additional Equipment: None  Intra-op Plan:   Post-operative Plan: Extubation in OR  Informed Consent: I have reviewed the patients History and Physical, chart, labs and discussed the procedure including the risks, benefits and alternatives for the proposed anesthesia with the patient or authorized representative who has indicated his/her understanding and acceptance.     Dental Advisory Given  Plan Discussed with: CRNA and Anesthesiologist  Anesthesia Plan Comments: (Discussed both nerve block for pain relief post-op and GA; including NV, sore throat, dental injury, and pulmonary complications Patient has intellectual disability. Her brother is present with today and is her care giver. He signed consent forms. Stated she does not have a legal guardian.   )       Anesthesia Quick Evaluation

## 2023-06-12 NOTE — Anesthesia Procedure Notes (Signed)
 Anesthesia Regional Block: Interscalene brachial plexus block   Pre-Anesthetic Checklist: , timeout performed,  Correct Patient, Correct Site, Correct Laterality,  Correct Procedure, Correct Position, site marked,  Risks and benefits discussed,  Surgical consent,  Pre-op evaluation,  At surgeon's request and post-op pain management  Laterality: Right  Prep: chloraprep       Needles:  Injection technique: Single-shot  Needle Type: Echogenic Stimulator Needle     Needle Length: 5cm  Needle Gauge: 22     Additional Needles:   Procedures:, nerve stimulator,,, ultrasound used (permanent image in chart),,     Nerve Stimulator or Paresthesia:  Response: quadraceps contraction, 0.45 mA  Additional Responses:   Narrative:  Start time: 06/12/2023 10:45 AM End time: 06/12/2023 10:50 AM Injection made incrementally with aspirations every 5 mL.  Performed by: Personally  Anesthesiologist: Mallory Manus, MD  Additional Notes: Functioning IV was confirmed and monitors were applied.  A 50mm 22ga Arrow echogenic stimulator needle was used. Sterile prep and drape,hand hygiene and sterile gloves were used. Ultrasound guidance: relevant anatomy identified, needle position confirmed, local anesthetic spread visualized around nerve(s)., vascular puncture avoided.  Image printed for medical record. Negative aspiration and negative test dose prior to incremental administration of local anesthetic. The patient tolerated the procedure well.  No pik

## 2023-06-12 NOTE — Interval H&P Note (Signed)
 All questions answered

## 2023-06-12 NOTE — Anesthesia Procedure Notes (Signed)
 Procedure Name: Intubation Date/Time: 06/12/2023 11:17 AM  Performed by: Erick Fitz, CRNAPre-anesthesia Checklist: Patient identified, Emergency Drugs available, Suction available, Patient being monitored and Timeout performed Patient Re-evaluated:Patient Re-evaluated prior to induction Oxygen  Delivery Method: Circle system utilized Preoxygenation: Pre-oxygenation with 100% oxygen  Induction Type: IV induction Ventilation: Mask ventilation without difficulty Laryngoscope Size: Mac and 3 Grade View: Grade I Tube type: Oral Tube size: 7.0 mm Number of attempts: 1 Airway Equipment and Method: Stylet Placement Confirmation: ETT inserted through vocal cords under direct vision, positive ETCO2, CO2 detector and breath sounds checked- equal and bilateral Secured at: 22 cm Tube secured with: Tape Dental Injury: Teeth and Oropharynx as per pre-operative assessment

## 2023-06-13 ENCOUNTER — Encounter (HOSPITAL_COMMUNITY): Payer: Self-pay | Admitting: Orthopaedic Surgery

## 2023-06-13 NOTE — Op Note (Signed)
 Orthopaedic Surgery Operative Note (CSN: [id number])  Maria [name]  20-Nov-1960 Date of Surgery: 06/12/2023   Diagnoses:  right proximal humerus fracture  Procedure: Right reverse  Total Shoulder Arthroplasty   Operative Finding Successful completion of planned procedure.  Due to the patient's mentation limitations and her inability to participate in sling wear we felt that attempting to perform a repair of the tuberosity fragments and cuff alone would likely fail.  Reverse shoulder arthroplasty provided some increased her ability that we worry about instability.  Axillary nerve tug test was normal.    Post-operative plan: The patient will be NWB in sling.  The patient will be will be discharged from PACU if continues to be stable as was plan prior to surgery.  DVT prophylaxis Aspirin  81 mg twice daily for 6 weeks.  Pain control with PRN pain medication preferring oral medicines.  Follow up plan will be scheduled in approximately 7 days for incision check and XR.  Physical therapy to start after 4 weeks.  Implants: Tornier perform humeral fracture stem size 12, +3 retentive polyethylene, 36+3 glenosphere with a 25+3 baseplate and 40 center screw with 2 peripheral locking screws  Post-Op Diagnosis: Same Surgeons:Primary: Cristy Bonner DASEN, MD Assistants: Jon Hurst, RNFA Location: [facility] ROOM 09 Anesthesia: General with Exparel  Interscalene Antibiotics: Ancef  2g preop, Vancomycin  1000mg  locally Tourniquet time: None Estimated Blood Loss: 100 Complications: None Specimens: None Implants: Implant Name Type Inv. Item Serial No. Manufacturer Lot No. LRB No. Used Action  SPHERE GLENOID LAT REV 36 - [id number] Joint SPHERE GLENOID LAT REV 36  [employer] [id number] Right 1 Implanted  SCREW BONE 6.5X40 SM - [id number] Screw SCREW BONE 6.5X40 SM  [employer]  Right 1 Implanted  SCREW 5.5X22 - [id number] Screw SCREW 5.5X22  [employer]  Right 1 Implanted  SCREW PERIPHERAL 30 - [id number] Screw SCREW  PERIPHERAL 30  [employer]  Right 1 Implanted  BASEPLATE GLENOID RSA 3X25 0D - [id number] Shoulder BASEPLATE GLENOID RSA 3X25 0D  [employer] [id number] Right 1 Implanted  CUP HUM REV INSERT SZ3/4 36 - [id number] Orthopedic Implant CUP HUM REV INSERT SZ3/4 36  [employer] [id number] Right 1 Implanted  STEM PERFORM FX 130 12L - [id number] Stem STEM PERFORM FX 130 12L [id number] [employer]  Right 1 Implanted    Indications for Surgery:   [name] is a 63 y.o. female with proximal humeral avulsion fractures.  Benefits and risks of operative and nonoperative management were discussed prior to surgery with patient/guardian(s) and informed consent form was completed.  Infection and need for further surgery were discussed as was prosthetic stability and cuff issues.  We additionally specifically discussed risks of axillary nerve injury, infection, periprosthetic fracture, continued pain and longevity of implants prior to beginning procedure.      Procedure:   The patient was identified in the preoperative holding area where the surgical site was marked. Block placed by anesthesia with exparel .  The patient was taken to the OR where a procedural timeout was called and the above noted anesthesia was induced.  The patient was positioned beachchair on allen table with spider arm positioner.  Preoperative antibiotics were dosed.  The patient's right shoulder was prepped and draped in the usual sterile fashion.  A second preoperative timeout was called.       Standard deltopectoral approach was performed with a #10 blade. We dissected down to the subcutaneous tissues and the cephalic vein was taken laterally with the deltoid. Clavipectoral fascia was  incised in line with the incision. Deep retractors were placed. The long of the biceps tendon was identified and there was significant tenosynovitis present.  Tenodesis was performed to the pectoralis tendon with #2 Ethibond. The remaining biceps was followed  up into the rotator interval where it was released.   The subscapularis was taken down in a full thickness layer with capsule along the humeral neck extending inferiorly around the humeral head. We continued releasing the capsule directly off of the osteophytes inferiorly all the way around the corner. This allowed us  to dislocate the humeral head.   There was early wear of the joint however it was not bone-on-bone, the posterior cuff was torn off secondary to the bony fragments that had avulsed.  There were osteophytes along the inferior humeral neck. The osteophytes were removed with an osteotome and a rongeur.  Osteophytes were removed with a rongeur and an osteotome and the anatomic neck was well visualized.     A humeral cutting guide was used extra medullary with a pin to help control version. The version was set at 20 of retroversion. Humeral osteotomy was performed with an oscillating saw. The head fragment was passed off the back table.  A cut protector plate was placed.  The subscapularis was again identified and immediately we took care to palpate the axillary nerve anteriorly and verify its position with gentle palpation as well as the tug test.  We then released the SGHL with bovie cautery prior to placing a curved mayo at the junction of the anterior glenoid well above the axillary nerve and bluntly dissecting the subscapularis from the capsule.  We then carefully protected the axillary nerve as we gently released the inferior capsule to fully mobilize the subscapularis.  An anterior deltoid retractor was then placed as well as a small Hohmann retractor superiorly.    The remaining labrum was removed circumferentially taking great care not to disrupt the posterior capsule.   The glenoid drill guide was placed and used to drill a guide pin in the center, inferior position. The glenoid face was then reamed concentrically over the guide wire. The center hole was drilled over the guidepin  in a near anatomic angle of version. Next the glenoid vault was drilled back to a depth of 40 mm.  We tapped and then placed a 25mm size baseplate with additional 3mm lateralization was selected with a 6.5 mm x 40 mm length central screw.  The base plate was screwed into the glenoid vault obtaining secure fixation. We next placed superior and inferior locking screws for additional fixation.  Next a 36+3 mm glenosphere was selected and impacted onto the baseplate. The center screw was tightened.  We turned attention back to the humeral side. The cut protector was removed.  We used the perform humeral sizing block to select the appropriate size which for this patient was a 2.  We then placed our center pin and reamed over it concentrically obtaining appropriate inset.  We then used our lateralizing chisel to prepare the lateral aspect of the humerus.  At that point we selected the appropriate implant trialing a 12 fracture.  Using this trial implant we trialed multiple polyethylene sizes settling on a 3 retentive which provided good stability and range of motion without excess soft tissue tension. The offset was dialed in to match the normal anatomy. The shoulder was trialed.  There was good ROM in all planes and the shoulder was stable with no inferior translation.  The real  humeral implants were opened after again confirming sizes.  The trial was removed. #5 Fiberwire x2 sutures passed through the humeral neck for subscap repair. The humeral component was press-fit obtaining a secure fit. The joint was reduced and thoroughly irrigated with pulsatile lavage. Subscap was repaired back with #5 Fiberwire sutures through bone tunnels. Hemostasis was obtained. The deltopectoral interval was reapproximated with #1 Ethibond. The subcutaneous tissues were closed with 2-0 Vicryl and the skin was closed with running nylon.    The wounds were cleaned and dried and an Aquacel dressing was placed. The drapes taken down. The  arm was placed into sling with abduction pillow. Patient was awakened, extubated, and transferred to the recovery room in stable condition. There were no intraoperative complications. The sponge, needle, and attention counts were  correct at the end of the case.

## 2023-06-16 ENCOUNTER — Encounter (HOSPITAL_COMMUNITY): Payer: Self-pay | Admitting: Emergency Medicine

## 2023-06-16 ENCOUNTER — Other Ambulatory Visit: Payer: Self-pay

## 2023-06-16 ENCOUNTER — Emergency Department (HOSPITAL_COMMUNITY): Payer: Medicare Other

## 2023-06-16 ENCOUNTER — Emergency Department (HOSPITAL_COMMUNITY)
Admission: EM | Admit: 2023-06-16 | Discharge: 2023-06-16 | Disposition: A | Discharge status: Home / Self Care | Payer: Medicare Other | Attending: Emergency Medicine | Admitting: Emergency Medicine

## 2023-06-16 DIAGNOSIS — R9431 Abnormal electrocardiogram [ECG] [EKG]: Secondary | ICD-10-CM | POA: Diagnosis not present

## 2023-06-16 DIAGNOSIS — R6510 Systemic inflammatory response syndrome (SIRS) of non-infectious origin without acute organ dysfunction: Secondary | ICD-10-CM | POA: Insufficient documentation

## 2023-06-16 DIAGNOSIS — Z8701 Personal history of pneumonia (recurrent): Secondary | ICD-10-CM | POA: Diagnosis not present

## 2023-06-16 DIAGNOSIS — I051 Rheumatic mitral insufficiency: Secondary | ICD-10-CM | POA: Diagnosis not present

## 2023-06-16 DIAGNOSIS — K219 Gastro-esophageal reflux disease without esophagitis: Secondary | ICD-10-CM | POA: Diagnosis not present

## 2023-06-16 DIAGNOSIS — R000 Tachycardia, unspecified: Secondary | ICD-10-CM | POA: Diagnosis not present

## 2023-06-16 DIAGNOSIS — Z556 Problems related to health literacy: Secondary | ICD-10-CM | POA: Diagnosis not present

## 2023-06-16 DIAGNOSIS — I509 Heart failure, unspecified: Secondary | ICD-10-CM | POA: Diagnosis not present

## 2023-06-16 DIAGNOSIS — Z7982 Long term (current) use of aspirin: Secondary | ICD-10-CM | POA: Diagnosis not present

## 2023-06-16 DIAGNOSIS — F419 Anxiety disorder, unspecified: Secondary | ICD-10-CM | POA: Diagnosis not present

## 2023-06-16 DIAGNOSIS — U071 COVID-19: Secondary | ICD-10-CM | POA: Diagnosis not present

## 2023-06-16 DIAGNOSIS — Z96611 Presence of right artificial shoulder joint: Secondary | ICD-10-CM | POA: Diagnosis not present

## 2023-06-16 DIAGNOSIS — D72829 Elevated white blood cell count, unspecified: Secondary | ICD-10-CM | POA: Diagnosis not present

## 2023-06-16 DIAGNOSIS — Z87891 Personal history of nicotine dependence: Secondary | ICD-10-CM | POA: Diagnosis not present

## 2023-06-16 DIAGNOSIS — K5900 Constipation, unspecified: Secondary | ICD-10-CM | POA: Diagnosis not present

## 2023-06-16 DIAGNOSIS — E782 Mixed hyperlipidemia: Secondary | ICD-10-CM | POA: Diagnosis not present

## 2023-06-16 DIAGNOSIS — I110 Hypertensive heart disease with heart failure: Secondary | ICD-10-CM | POA: Diagnosis not present

## 2023-06-16 DIAGNOSIS — S42201D Unspecified fracture of upper end of right humerus, subsequent encounter for fracture with routine healing: Secondary | ICD-10-CM | POA: Diagnosis not present

## 2023-06-16 DIAGNOSIS — R918 Other nonspecific abnormal finding of lung field: Secondary | ICD-10-CM | POA: Diagnosis not present

## 2023-06-16 DIAGNOSIS — Z9181 History of falling: Secondary | ICD-10-CM | POA: Diagnosis not present

## 2023-06-16 DIAGNOSIS — A419 Sepsis, unspecified organism: Secondary | ICD-10-CM | POA: Diagnosis not present

## 2023-06-16 DIAGNOSIS — R4182 Altered mental status, unspecified: Secondary | ICD-10-CM | POA: Diagnosis not present

## 2023-06-16 DIAGNOSIS — Z791 Long term (current) use of non-steroidal anti-inflammatories (NSAID): Secondary | ICD-10-CM | POA: Diagnosis not present

## 2023-06-16 DIAGNOSIS — D649 Anemia, unspecified: Secondary | ICD-10-CM | POA: Diagnosis not present

## 2023-06-16 DIAGNOSIS — R509 Fever, unspecified: Secondary | ICD-10-CM | POA: Diagnosis present

## 2023-06-16 DIAGNOSIS — G40909 Epilepsy, unspecified, not intractable, without status epilepticus: Secondary | ICD-10-CM | POA: Diagnosis not present

## 2023-06-16 DIAGNOSIS — D696 Thrombocytopenia, unspecified: Secondary | ICD-10-CM | POA: Diagnosis not present

## 2023-06-16 LAB — URINALYSIS, W/ REFLEX TO CULTURE (INFECTION SUSPECTED)
Bacteria, UA: NONE SEEN
Bilirubin Urine: NEGATIVE
Glucose, UA: NEGATIVE mg/dL
Ketones, ur: 80 mg/dL — AB
Leukocytes,Ua: NEGATIVE
Nitrite: NEGATIVE
Protein, ur: NEGATIVE mg/dL
Specific Gravity, Urine: 1.012 (ref 1.005–1.030)
pH: 6 (ref 5.0–8.0)

## 2023-06-16 LAB — RESP PANEL BY RT-PCR (RSV, FLU A&B, COVID)  RVPGX2
Influenza A by PCR: NEGATIVE
Influenza B by PCR: NEGATIVE
Resp Syncytial Virus by PCR: NEGATIVE
SARS Coronavirus 2 by RT PCR: POSITIVE — AB

## 2023-06-16 LAB — COMPREHENSIVE METABOLIC PANEL
ALT: 20 U/L (ref 0–44)
AST: 26 U/L (ref 15–41)
Albumin: 4 g/dL (ref 3.5–5.0)
Alkaline Phosphatase: 107 U/L (ref 38–126)
Anion gap: 13 (ref 5–15)
BUN: 21 mg/dL (ref 8–23)
CO2: 22 mmol/L (ref 22–32)
Calcium: 9.5 mg/dL (ref 8.9–10.3)
Chloride: 100 mmol/L (ref 98–111)
Creatinine, Ser: 0.93 mg/dL (ref 0.44–1.00)
GFR, Estimated: >60 mL/min (ref >60)
Glucose, Bld: 107 mg/dL — ABNORMAL HIGH (ref 70–99)
Potassium: 3.9 mmol/L (ref 3.5–5.1)
Sodium: 135 mmol/L (ref 135–145)
Total Bilirubin: 1.2 mg/dL (ref 0.0–1.2)
Total Protein: 7.5 g/dL (ref 6.5–8.1)

## 2023-06-16 LAB — CBC WITH DIFFERENTIAL/PLATELET
Abs Immature Granulocytes: 0.04 10*3/uL (ref 0.00–0.07)
Basophils Absolute: 0 10*3/uL (ref 0.0–0.1)
Basophils Relative: 0 %
Eosinophils Absolute: 0 10*3/uL (ref 0.0–0.5)
Eosinophils Relative: 0 %
HCT: 32.2 % — ABNORMAL LOW (ref 36.0–46.0)
Hemoglobin: 10.5 g/dL — ABNORMAL LOW (ref 12.0–15.0)
Immature Granulocytes: 0 %
Lymphocytes Relative: 4 %
Lymphs Abs: 0.5 10*3/uL — ABNORMAL LOW (ref 0.7–4.0)
MCH: 30.8 pg (ref 26.0–34.0)
MCHC: 32.6 g/dL (ref 30.0–36.0)
MCV: 94.4 fL (ref 80.0–100.0)
Monocytes Absolute: 0.5 10*3/uL (ref 0.1–1.0)
Monocytes Relative: 4 %
Neutro Abs: 11 10*3/uL — ABNORMAL HIGH (ref 1.7–7.7)
Neutrophils Relative %: 92 %
Platelets: 200 10*3/uL (ref 150–400)
RBC: 3.41 MIL/uL — ABNORMAL LOW (ref 3.87–5.11)
RDW: 14.6 % (ref 11.5–15.5)
WBC: 12 10*3/uL — ABNORMAL HIGH (ref 4.0–10.5)
nRBC: 0 % (ref 0.0–0.2)

## 2023-06-16 LAB — LACTIC ACID, PLASMA: Lactic Acid, Venous: 1.3 mmol/L (ref 0.5–1.9)

## 2023-06-16 MED ORDER — ONDANSETRON 4 MG PO TBDP: 4.0000 mg | ORAL_TABLET | Freq: Three times a day (TID) | ORAL | 0 refills | Status: DC | PRN

## 2023-06-16 MED ORDER — FENTANYL CITRATE PF 50 MCG/ML IJ SOSY
50.0000 ug | PREFILLED_SYRINGE | Freq: Once | INTRAMUSCULAR | Status: AC
  Administered 2023-06-16: 50 ug via INTRAVENOUS
  Filled 2023-06-16: qty 1

## 2023-06-16 MED ORDER — LACTATED RINGERS IV BOLUS (SEPSIS)
1000.0000 mL | Freq: Once | INTRAVENOUS | Status: AC
  Administered 2023-06-16: 1000 mL via INTRAVENOUS

## 2023-06-16 MED ORDER — LACTATED RINGERS IV BOLUS (SEPSIS): 250.0000 mL | Freq: Once | INTRAVENOUS | Status: DC

## 2023-06-16 MED ORDER — VANCOMYCIN HCL 1500 MG/300ML IV SOLN
1500.0000 mg | Freq: Once | INTRAVENOUS | Status: AC
  Administered 2023-06-16: 1500 mg via INTRAVENOUS
  Filled 2023-06-16: qty 300

## 2023-06-16 MED ORDER — VANCOMYCIN HCL IN DEXTROSE 1-5 GM/200ML-% IV SOLN: 1000.0000 mg | Freq: Once | INTRAVENOUS | Status: DC

## 2023-06-16 MED ORDER — SODIUM CHLORIDE 0.9 % IV SOLN
2.0000 g | Freq: Once | INTRAVENOUS | Status: AC
  Administered 2023-06-16: 2 g via INTRAVENOUS
  Filled 2023-06-16: qty 12.5

## 2023-06-16 MED ORDER — OXYCODONE-ACETAMINOPHEN 5-325 MG PO TABS
1.0000 | ORAL_TABLET | Freq: Once | ORAL | Status: AC
  Administered 2023-06-16: 1 via ORAL
  Filled 2023-06-16: qty 1

## 2023-06-16 MED ORDER — ACETAMINOPHEN 500 MG PO TABS
1000.0000 mg | ORAL_TABLET | Freq: Once | ORAL | Status: AC
Start: 2023-06-16 — End: 2023-06-16
  Administered 2023-06-16: 1000 mg via ORAL
  Filled 2023-06-16: qty 2

## 2023-06-16 MED ORDER — METRONIDAZOLE 500 MG/100ML IV SOLN
500.0000 mg | Freq: Once | INTRAVENOUS | Status: AC
  Administered 2023-06-16: 500 mg via INTRAVENOUS
  Filled 2023-06-16: qty 100

## 2023-06-16 MED ORDER — LACTATED RINGERS IV SOLN: INTRAVENOUS | Status: DC

## 2023-06-16 NOTE — ED Triage Notes (Signed)
 Pt had shoulder replacement Thursday and developed fever today. Per family she is more altered than normal.

## 2023-06-16 NOTE — Discharge Instructions (Addendum)
 The patient has COVID-19, please isolate according to CDC guidelines.  Use Tylenol , and ibuprofen  to help with her fever.  I have offered Paxlovid, which you have declined.  Return to the ER if you feel like your symptoms are worsening.  Her chest x-ray, blood work looked good, as well as the evaluation of her shoulder is reassuring.  Please follow-up with your orthopedic surgeon for further evaluation, and your primary care doctor.

## 2023-06-16 NOTE — Sepsis Progress Note (Signed)
 Elink monitoring for the code sepsis protocol.

## 2023-06-16 NOTE — ED Provider Notes (Signed)
 [place] EMERGENCY DEPARTMENT AT [facility] Provider Note   CSN: [id number] Arrival date & time: 06/16/23  1524     History  Chief Complaint  Patient presents with   Fever    NASIM GAROFANO is a 63 y.o. female, history of intellectual disability, who presents to the ED secondary to increased confusion, fever, and worsening pain of the right shoulder since she had a right shoulder reverse arthroplasty, on 1/9, by Dr. Cristy.  History is limited given patient's intellectual disability, but medical POA, sister at bedside states that she has been confused since the surgery, its progressively gotten worse, and today she tried to pee in a clothes hamper.  She also had a fever of 101 today, and has not gotten Tylenol  or ibuprofen .  She states that she has not had any shortness of breath, cough, nausea, vomiting, or complaint of abdominal pain.  Has not been eating well however.  Notes that her heart rate was in the 130s, at home.  Has been complaining about her shoulder pain, since having surgery.    Home Medications Prior to Admission medications   Medication Sig Start Date End Date Taking? Authorizing Provider  ondansetron  (ZOFRAN -ODT) 4 MG disintegrating tablet Take 1 tablet (4 mg total) by mouth every 8 (eight) hours as needed for nausea or vomiting. 06/16/23  Yes Dorise Gangi L, PA  acetaminophen  (TYLENOL ) 500 MG tablet Take 1 tablet (500 mg total) by mouth every 8 (eight) hours for 14 days. 06/12/23 06/26/23  McBane, Caroline N, PA-C  aspirin  (ASPIRIN  CHILDRENS) 81 MG chewable tablet Chew 1 tablet (81 mg total) by mouth 2 (two) times daily. For 6 weeks for DVT prophylaxis after surgery 06/12/23 07/24/23  Jennye Aleck SAILOR, PA-C  atorvastatin  (LIPITOR) 20 MG tablet Take 20 mg by mouth at bedtime. 08/24/18   [provider]  Calcium  Carb-Cholecalciferol (CALCIUM /VITAMIN D  PO) Take 1 tablet by mouth 2 (two) times daily. 600 mg / 800 units    [provider]  calcium   carbonate (TUMS - DOSED IN MG ELEMENTAL CALCIUM ) 500 MG chewable tablet Chew 1 tablet by mouth daily as needed for indigestion or heartburn.    [provider]  celecoxib  (CELEBREX ) 100 MG capsule Take 1 capsule (100 mg total) by mouth 2 (two) times daily. For 2 weeks. Then take as needed 06/12/23 07/12/23  McBane, Caroline N, PA-C  cholecalciferol (VITAMIN D3) 25 MCG (1000 UNIT) tablet Take 1,000 Units by mouth daily.    [provider]  clonazePAM  (KLONOPIN ) 0.5 MG tablet Take 1 tablet (0.5 mg total) by mouth 2 (two) times daily. 04/28/23   Gayland Lauraine PARAS, NP  divalproex  (DEPAKOTE ) 500 MG DR tablet TAKE 2 TABLETS (1,000 MG TOTAL) BY MOUTH DAILY. Patient taking differently: Take 500 mg by mouth 2 (two) times daily. 07/10/22 07/10/23  Gayland Lauraine PARAS, NP  HYDROcodone -acetaminophen  (NORCO) 5-325 MG tablet Take 1-2 tablets by mouth every 6 (six) hours as needed for severe pain (pain score 7-10). 06/12/23   McBane, Caroline N, PA-C  lacosamide  (VIMPAT ) 200 MG TABS tablet TAKE 1 TABLET TWICE A DAY 01/21/23   Gayland Lauraine PARAS, NP  Multiple Vitamin (MULTIVITAMIN) capsule Take 1 capsule by mouth daily.    [provider]  omeprazole  (PRILOSEC) 40 MG capsule Take 1 capsule (40 mg total) by mouth daily. 05/06/23   Castaneda Mayorga, Daniel, MD  senna (SENOKOT) 8.6 MG tablet Take 2 tablets by mouth daily.    [provider]  tiZANidine  (ZANAFLEX )  4 MG tablet Take 1 tablet (4 mg total) by mouth every 8 (eight) hours as needed for muscle spasms. Do not take with alcohol or while driving or operating heavy machinery.  May cause drowsiness. Patient not taking: Reported on 06/09/2023 11/12/22   Chandra Harlene LABOR, NP  Vibegron  75 MG TABS Take 1 tablet (75 mg total) by mouth daily. Patient not taking: Reported on 06/09/2023 03/24/23   Matilda Senior, MD  QUEtiapine  (SEROQUEL ) 25 MG tablet Take 1 tablet (25 mg total) by mouth 2 (two) times daily. 05/31/20 06/06/20  Angiulli, Toribio PARAS, PA-C       Allergies    Codeine and Lamictal [lamotrigine]    Review of Systems   Review of Systems  Constitutional:  Positive for fever.  Respiratory:  Negative for shortness of breath.   Gastrointestinal:  Negative for abdominal pain.    Physical Exam Updated Vital Signs BP 104/80   Pulse 93   Temp 98.9 F (37.2 C) (Oral)   Resp 18   Ht 5' 6 (1.676 m)   Wt 66 kg   SpO2 98%   BMI 23.48 kg/m  Physical Exam Vitals and nursing note reviewed.  Constitutional:      General: She is not in acute distress.    Appearance: She is well-developed.  HENT:     Head: Normocephalic and atraumatic.  Eyes:     Conjunctiva/sclera: Conjunctivae normal.  Cardiovascular:     Rate and Rhythm: Normal rate and regular rhythm.     Heart sounds: No murmur heard. Pulmonary:     Effort: Pulmonary effort is normal. No respiratory distress.     Breath sounds: Normal breath sounds.  Abdominal:     Palpations: Abdomen is soft.     Tenderness: There is no abdominal tenderness.  Musculoskeletal:        General: No swelling.     Cervical back: Neck supple.     Comments: TTP of R humeral head w/o increased warmth or erythema, +surgical dressing in place  Skin:    General: Skin is warm and dry.     Capillary Refill: Capillary refill takes less than 2 seconds.  Neurological:     Mental Status: She is alert.     Comments: Moaning  Psychiatric:        Mood and Affect: Mood normal.     ED Results / Procedures / Treatments   Labs (all labs ordered are listed, but only abnormal results are displayed) Labs Reviewed  RESP PANEL BY RT-PCR (RSV, FLU A&B, COVID)  RVPGX2 - Abnormal; Notable for the following components:      Result Value   SARS Coronavirus 2 by RT PCR POSITIVE (*)    All other components within normal limits  COMPREHENSIVE METABOLIC PANEL - Abnormal; Notable for the following components:   Glucose, Bld 107 (*)    All other components within normal limits  CBC WITH DIFFERENTIAL/PLATELET -  Abnormal; Notable for the following components:   WBC 12.0 (*)    RBC 3.41 (*)    Hemoglobin 10.5 (*)    HCT 32.2 (*)    Neutro Abs 11.0 (*)    Lymphs Abs 0.5 (*)    All other components within normal limits  URINALYSIS, W/ REFLEX TO CULTURE (INFECTION SUSPECTED) - Abnormal; Notable for the following components:   Hgb urine dipstick Denee Boeder (*)    Ketones, ur 80 (*)    All other components within normal limits  CULTURE, BLOOD (ROUTINE X 2)  CULTURE, BLOOD (ROUTINE X 2)  LACTIC ACID, PLASMA    EKG EKG Interpretation Date/Time:  Monday June 16 2023 17:46:58 EST Ventricular Rate:  93 PR Interval:  138 QRS Duration:  120 QT Interval:  370 QTC Calculation: 460 R Axis:   66  Text Interpretation: Normal sinus rhythm Low voltage QRS Right bundle branch block Abnormal ECG Confirmed by Yolande Charleston [phone]) on 06/16/2023 8:28:20 PM  Radiology DG Chest 2 View Result Date: 06/16/2023 CLINICAL DATA:  Sepsis EXAM: CHEST - 2 VIEW COMPARISON:  X-ray 08/28/2021. FINDINGS: No consolidation, pneumothorax or effusion. No edema. Normal cardiopericardial silhouette. Left basilar scar or atelectasis. Kyphotic x-ray limited evaluation of the apices. Right shoulder reverse arthroplasty. Degenerative changes of the spine on lateral view. IMPRESSION: Minimal left basilar scar or atelectasis. No consolidation or effusion. Kyphotic x-ray obscures the apices. Electronically Signed   By: Ranell Bring M.D.   On: 06/16/2023 18:09    Procedures Procedures    Medications Ordered in ED Medications  lactated ringers  bolus 1,000 mL (0 mLs Intravenous Stopped 06/16/23 1744)    And  lactated ringers  bolus 1,000 mL (0 mLs Intravenous Stopped 06/16/23 1944)  ceFEPIme  (MAXIPIME ) 2 g in sodium chloride  0.9 % 100 mL IVPB (0 g Intravenous Stopped 06/16/23 1657)  metroNIDAZOLE  (FLAGYL ) IVPB 500 mg (0 mg Intravenous Stopped 06/16/23 1805)  acetaminophen  (TYLENOL ) tablet 1,000 mg (1,000 mg Oral Given 06/16/23 1608)   fentaNYL  (SUBLIMAZE ) injection 50 mcg (50 mcg Intravenous Given 06/16/23 1608)  vancomycin  (VANCOREADY) IVPB 1500 mg/300 mL (0 mg Intravenous Stopped 06/16/23 1926)  oxyCODONE -acetaminophen  (PERCOCET/ROXICET) 5-325 MG per tablet 1 tablet (1 tablet Oral Given 06/16/23 1940)    ED Course/ Medical Decision Making/ A&P                                 Medical Decision Making Patient is a 63 year old female, history of intellectual disability, who had a shoulder replacement, on 1/8.  She has now developed a fever, repeatedly complaining of shoulder pain since the procedure, per her caregiver, and has been increasingly more confused.  Given her tachycardia, febrile status, sepsis criteria was initiated.  Chest x-ray, blood work, COVID/flu ordered.  She has not had any recent falls thus we will hold off on head CT at this time.  Amount and/or Complexity of Data Reviewed Labs: ordered.    Details: Mild leukocytosis of 12K, lactic acid within normal limits, normal urinalysis Radiology: ordered.    Details: Chest x-ray clear Discussion of management or test interpretation with external provider(s): Patient is shoulder site is clear, no erythema, warmth.  She is well-appearing, except has intellectual disability and is screaming for pain medications, repeatedly.  She has mild leukocytosis, was found to have COVID-19.  Lactic acid is within normal limits, blood cultures within normal limits.  I was given Tylenol , fluids, stabilization normalization of pulse, and temperature.  I discussed with medical POA at bedside, sister, she is comfortable with taking patient home, further monitoring.  Her symptoms may be secondary to COVID, and confusion may be from the use of pain medications.  She has been taking these frequently at home.  We discussed return precautions, and sister voiced understanding, will further monitor patient at home.  Return if symptoms worsen.  I offered Paxlovid which they declined at this time.   Zofran  sent for nausea/vomiting.  Risk OTC drugs. Prescription drug management.    Final Clinical Impression(s) / ED Diagnoses  Final diagnoses:  COVID-19  SIRS (systemic inflammatory response syndrome) (HCC)    Rx / DC Orders ED Discharge Orders          Ordered    ondansetron  (ZOFRAN -ODT) 4 MG disintegrating tablet  Every 8 hours PRN        06/16/23 1914              Philippa Lyle CROME, [place] 06/16/23 2126    Yolande [name] BROCKS, MD 06/18/23 1538

## 2023-06-16 NOTE — Sepsis Progress Note (Signed)
 Asking bedside RN to check on lactic acid results(drawn @ 1619)

## 2023-06-21 LAB — CULTURE, BLOOD (ROUTINE X 2)
Culture: NO GROWTH
Culture: NO GROWTH
Special Requests: ADEQUATE

## 2023-06-25 DIAGNOSIS — S42201D Unspecified fracture of upper end of right humerus, subsequent encounter for fracture with routine healing: Secondary | ICD-10-CM | POA: Diagnosis not present

## 2023-06-25 DIAGNOSIS — K5900 Constipation, unspecified: Secondary | ICD-10-CM | POA: Diagnosis not present

## 2023-06-25 DIAGNOSIS — D696 Thrombocytopenia, unspecified: Secondary | ICD-10-CM | POA: Diagnosis not present

## 2023-06-25 DIAGNOSIS — G40909 Epilepsy, unspecified, not intractable, without status epilepticus: Secondary | ICD-10-CM | POA: Diagnosis not present

## 2023-06-25 DIAGNOSIS — I110 Hypertensive heart disease with heart failure: Secondary | ICD-10-CM | POA: Diagnosis not present

## 2023-06-25 DIAGNOSIS — I509 Heart failure, unspecified: Secondary | ICD-10-CM | POA: Diagnosis not present

## 2023-06-25 DIAGNOSIS — S42201A Unspecified fracture of upper end of right humerus, initial encounter for closed fracture: Secondary | ICD-10-CM | POA: Diagnosis not present

## 2023-06-26 DIAGNOSIS — I110 Hypertensive heart disease with heart failure: Secondary | ICD-10-CM | POA: Diagnosis not present

## 2023-06-26 DIAGNOSIS — S42201D Unspecified fracture of upper end of right humerus, subsequent encounter for fracture with routine healing: Secondary | ICD-10-CM | POA: Diagnosis not present

## 2023-06-26 DIAGNOSIS — K5900 Constipation, unspecified: Secondary | ICD-10-CM | POA: Diagnosis not present

## 2023-06-26 DIAGNOSIS — D696 Thrombocytopenia, unspecified: Secondary | ICD-10-CM | POA: Diagnosis not present

## 2023-06-26 DIAGNOSIS — I509 Heart failure, unspecified: Secondary | ICD-10-CM | POA: Diagnosis not present

## 2023-06-26 DIAGNOSIS — G40909 Epilepsy, unspecified, not intractable, without status epilepticus: Secondary | ICD-10-CM | POA: Diagnosis not present

## 2023-06-27 DIAGNOSIS — I110 Hypertensive heart disease with heart failure: Secondary | ICD-10-CM | POA: Diagnosis not present

## 2023-06-27 DIAGNOSIS — K5900 Constipation, unspecified: Secondary | ICD-10-CM | POA: Diagnosis not present

## 2023-06-27 DIAGNOSIS — S42201D Unspecified fracture of upper end of right humerus, subsequent encounter for fracture with routine healing: Secondary | ICD-10-CM | POA: Diagnosis not present

## 2023-06-27 DIAGNOSIS — I509 Heart failure, unspecified: Secondary | ICD-10-CM | POA: Diagnosis not present

## 2023-06-27 DIAGNOSIS — D696 Thrombocytopenia, unspecified: Secondary | ICD-10-CM | POA: Diagnosis not present

## 2023-06-27 DIAGNOSIS — G40909 Epilepsy, unspecified, not intractable, without status epilepticus: Secondary | ICD-10-CM | POA: Diagnosis not present

## 2023-07-01 DIAGNOSIS — K5900 Constipation, unspecified: Secondary | ICD-10-CM | POA: Diagnosis not present

## 2023-07-01 DIAGNOSIS — I110 Hypertensive heart disease with heart failure: Secondary | ICD-10-CM | POA: Diagnosis not present

## 2023-07-01 DIAGNOSIS — I509 Heart failure, unspecified: Secondary | ICD-10-CM | POA: Diagnosis not present

## 2023-07-01 DIAGNOSIS — S42201D Unspecified fracture of upper end of right humerus, subsequent encounter for fracture with routine healing: Secondary | ICD-10-CM | POA: Diagnosis not present

## 2023-07-01 DIAGNOSIS — D696 Thrombocytopenia, unspecified: Secondary | ICD-10-CM | POA: Diagnosis not present

## 2023-07-01 DIAGNOSIS — G40909 Epilepsy, unspecified, not intractable, without status epilepticus: Secondary | ICD-10-CM | POA: Diagnosis not present

## 2023-07-03 DIAGNOSIS — I509 Heart failure, unspecified: Secondary | ICD-10-CM | POA: Diagnosis not present

## 2023-07-03 DIAGNOSIS — D696 Thrombocytopenia, unspecified: Secondary | ICD-10-CM | POA: Diagnosis not present

## 2023-07-03 DIAGNOSIS — G40909 Epilepsy, unspecified, not intractable, without status epilepticus: Secondary | ICD-10-CM | POA: Diagnosis not present

## 2023-07-03 DIAGNOSIS — I110 Hypertensive heart disease with heart failure: Secondary | ICD-10-CM | POA: Diagnosis not present

## 2023-07-03 DIAGNOSIS — K5900 Constipation, unspecified: Secondary | ICD-10-CM | POA: Diagnosis not present

## 2023-07-03 DIAGNOSIS — S42201D Unspecified fracture of upper end of right humerus, subsequent encounter for fracture with routine healing: Secondary | ICD-10-CM | POA: Diagnosis not present

## 2023-07-07 ENCOUNTER — Ambulatory Visit (INDEPENDENT_AMBULATORY_CARE_PROVIDER_SITE_OTHER): Payer: Medicare Other | Admitting: Gastroenterology

## 2023-07-07 ENCOUNTER — Encounter (INDEPENDENT_AMBULATORY_CARE_PROVIDER_SITE_OTHER): Payer: Self-pay | Admitting: Gastroenterology

## 2023-07-07 VITALS — BP 143/72 | HR 76 | Temp 97.1°F | Ht 66.0 in | Wt 134.0 lb

## 2023-07-07 DIAGNOSIS — K5903 Drug induced constipation: Secondary | ICD-10-CM

## 2023-07-07 DIAGNOSIS — K5900 Constipation, unspecified: Secondary | ICD-10-CM | POA: Insufficient documentation

## 2023-07-07 DIAGNOSIS — K219 Gastro-esophageal reflux disease without esophagitis: Secondary | ICD-10-CM | POA: Diagnosis not present

## 2023-07-07 DIAGNOSIS — S42201D Unspecified fracture of upper end of right humerus, subsequent encounter for fracture with routine healing: Secondary | ICD-10-CM | POA: Diagnosis not present

## 2023-07-07 DIAGNOSIS — D696 Thrombocytopenia, unspecified: Secondary | ICD-10-CM | POA: Diagnosis not present

## 2023-07-07 DIAGNOSIS — G40909 Epilepsy, unspecified, not intractable, without status epilepticus: Secondary | ICD-10-CM | POA: Diagnosis not present

## 2023-07-07 DIAGNOSIS — I509 Heart failure, unspecified: Secondary | ICD-10-CM | POA: Diagnosis not present

## 2023-07-07 DIAGNOSIS — I110 Hypertensive heart disease with heart failure: Secondary | ICD-10-CM | POA: Diagnosis not present

## 2023-07-07 NOTE — Progress Notes (Signed)
Maria [name], M.D. Gastroenterology & Hepatology [facility] Gastroenterology [address] [place], [place] [address]  Primary Care Physician: Benita Stabile, MD [address] [place] [address]  I will communicate my assessment and recommendations to the referring MD via EMR.  Problems: GERD Constipation   History of Present Illness: Maria [name] is a 63 y.o. female with PMH GERD, cognitive impairment, seizures, who presents for follow up of GERD and constipation.  The patient was last seen on 07/04/2022. At that time, the patient  was continued on omeprazole 40 mg and daily stool softeners.  Brother comes with patient to the office, he provides most of the information. She recently had a fall and had a shoulder fracture, had surgery 3 week ago. Has not been eating too well for the last 1.5 weeks due to the pain.  She has been taking hydrocodone as needed for pain control after surgery.  She required to take a fleet enema recently, but she had her Miralax increased to 2 capfuls in AM and 2.5 capfuls at night, which has made her more regular.    The patient denies having any nausea, vomiting, fever, chills, hematochezia, melena, hematemesis, abdominal distention, abdominal pain, diarrhea, jaundice, pruritus or weight loss.  Last EGD: 2016 - Ectopic islands of gastric tissue below upper esophageal sphincter. Noncritical Schatzki's ring and small sliding hiatal hernia. Small hyperplastic appearing polyps at gastric body. These were left alone. Erosive gastritis with small gastric ulcer at antrum. Esophagus dilated by passing 54 French Maloney dilator resulting in linear disruption at GE junction and also it proximal esophagus indicative of disrupted web.  Last Colonoscopy: 08/07/2022 - One 15 to 17 mm polyp in the proximal ascending colon, removed piecemeal using a hot snare. Resected and retrieved via EMR. Clips were placed. Clip manufacturer:  [employer]. - Four 3 to 6 mm polyps in the transverse colon and in the ascending colon, removed with a cold snare. Resected and retrieved. - Seven 2 to 8 mm polyps in the sigmoid colon and in the descending colon, removed with a cold snare. Resected and retrieved. - The distal rectum and anal verge are normal on retroflexion view.  Recommended repeat in 1 year.  Past Medical History: Past Medical History:  Diagnosis Date   Constipation    Dysphagia    Fracture    R foot   GERD (gastroesophageal reflux disease)    Headache    History of shingles 10/2016   Mental retardation    lesion in head   Pneumonia    Seizures (HCC)    Last seizure 05/31/23   Thrombocytopenia (HCC)    Tremor     Past Surgical History: Past Surgical History:  Procedure Laterality Date   BIOPSY  09/16/2014   Procedure: BIOPSY;  Surgeon: Malissa Hippo, MD;  [facility]: [facility];  Service: Endoscopy;;   CATARACT EXTRACTION     both eyes, May of 2015   COLONOSCOPY     COLONOSCOPY WITH PROPOFOL N/A 11/20/2018   Procedure: COLONOSCOPY WITH PROPOFOL;  Surgeon: Malissa Hippo, MD;  [facility];  Service: Endoscopy;  Laterality: N/A;   COLONOSCOPY WITH PROPOFOL N/A 08/07/2022   Procedure: COLONOSCOPY WITH PROPOFOL;  Surgeon: Dolores Frame, MD;  [facility];  Service: Gastroenterology;  Laterality: N/A;  7:30am, asa 1-2   ESOPHAGEAL DILATION N/A 09/16/2014   Procedure: ESOPHAGEAL DILATION WITH 54FR MALONEY DILATOR;  Surgeon: Malissa Hippo, MD;  [facility]: [facility];  Service: Endoscopy;  Laterality: N/A;   ESOPHAGOGASTRODUODENOSCOPY (EGD) WITH PROPOFOL N/A 09/16/2014   Procedure: ESOPHAGOGASTRODUODENOSCOPY (EGD) WITH PROPOFOL;  Surgeon: Malissa Hippo, MD;  [facility];  Service: Endoscopy;  Laterality: N/A;   HEMOSTASIS CLIP PLACEMENT  08/07/2022   Procedure: HEMOSTASIS CLIP PLACEMENT;  Surgeon: Dolores Frame, MD;  [facility];  Service:  Gastroenterology;;   LAPAROSCOPIC APPENDECTOMY N/A 07/14/2020   Procedure: APPENDECTOMY LAPAROSCOPIC;  Surgeon: Lucretia Roers, MD;  [facility]: [facility];  Service: General;  Laterality: N/A;   MOUTH SURGERY     POLYPECTOMY  11/20/2018   Procedure: POLYPECTOMY;  Surgeon: Malissa Hippo, MD;  [facility];  Service: Endoscopy;;  colon   POLYPECTOMY  08/07/2022   Procedure: POLYPECTOMY;  Surgeon: Dolores Frame, MD;  [facility];  Service: Gastroenterology;;   REVERSE SHOULDER ARTHROPLASTY Right 06/12/2023   Procedure: REVERSE SHOULDER ARTHROPLASTY;  Surgeon: Bjorn Pippin, MD;  [facility]: [facility];  Service: Orthopedics;  Laterality: Right;   Skin graft to gum Right 08/2013   SUBMUCOSAL LIFTING INJECTION  08/07/2022   Procedure: SUBMUCOSAL LIFTING INJECTION;  Surgeon: Dolores Frame, MD;  [facility];  Service: Gastroenterology;;   TONSILLECTOMY AND ADENOIDECTOMY     TOTAL ABDOMINAL HYSTERECTOMY      Family History: Family History  Problem Relation Age of Onset   High Cholesterol Mother    High blood pressure Mother    Diabetes Father     Social History: Social History   Tobacco Use  Smoking Status Former   Current packs/day: 0.00   Average packs/day: 1.5 packs/day for 15.0 years (22.5 ttl pk-yrs)   Types: Cigarettes   Start date: 05/23/1991   Quit date: 05/22/2006   Years since quitting: 17.1  Smokeless Tobacco Never   Social History   Substance and Sexual Activity  Alcohol Use No   Alcohol/week: 0.0 standard drinks of alcohol   Social History   Substance and Sexual Activity  Drug Use No    Allergies: Allergies  Allergen Reactions   Codeine Nausea And Vomiting   Lamictal [Lamotrigine] Rash    Medications: Current Outpatient Medications  Medication Sig Dispense Refill   aspirin EC 81 MG tablet Take 81 mg by mouth daily. Swallow whole.     atorvastatin (LIPITOR) 20 MG tablet Take 20 mg by mouth at bedtime.      Calcium Carb-Cholecalciferol (CALCIUM/VITAMIN D PO) Take 1 tablet by mouth 2 (two) times daily. 600 mg / 800 units     calcium carbonate (TUMS - DOSED IN MG ELEMENTAL CALCIUM) 500 MG chewable tablet Chew 1 tablet by mouth daily as needed for indigestion or heartburn.     cholecalciferol (VITAMIN D3) 25 MCG (1000 UNIT) tablet Take 1,000 Units by mouth 2 (two) times daily.     clonazePAM (KLONOPIN) 0.5 MG tablet Take 1 tablet (0.5 mg total) by mouth 2 (two) times daily. 14 tablet 0   divalproex (DEPAKOTE) 500 MG DR tablet TAKE 2 TABLETS (1,000 MG TOTAL) BY MOUTH DAILY. (Patient taking differently: Take 500 mg by mouth 2 (two) times daily.) 180 tablet 3   HYDROcodone-acetaminophen (NORCO) 5-325 MG tablet Take 1-2 tablets by mouth every 6 (six) hours as needed for severe pain (pain score 7-10). 40 tablet 0   lacosamide (VIMPAT) 200 MG TABS tablet TAKE 1 TABLET TWICE A DAY 180 tablet 1   Multiple Vitamin (MULTIVITAMIN) capsule Take 1 capsule by mouth daily.  omeprazole (PRILOSEC) 40 MG capsule Take 1 capsule (40 mg total) by mouth daily. 90 capsule 0   ondansetron (ZOFRAN-ODT) 4 MG disintegrating tablet Take 1 tablet (4 mg total) by mouth every 8 (eight) hours as needed for nausea or vomiting. 20 tablet 0   senna (SENOKOT) 8.6 MG tablet Take 2 tablets by mouth daily.     No current facility-administered medications for this visit.    Review of Systems: GENERAL: negative for malaise, night sweats HEENT: No changes in hearing or vision, no nose bleeds or other nasal problems. NECK: Negative for lumps, goiter, pain and significant neck swelling RESPIRATORY: Negative for cough, wheezing CARDIOVASCULAR: Negative for chest pain, leg swelling, palpitations, orthopnea GI: SEE HPI MUSCULOSKELETAL: Negative for joint pain or swelling, back pain, and muscle pain. SKIN: Negative for lesions, rash PSYCH: Negative for sleep disturbance, mood disorder and recent psychosocial stressors. HEMATOLOGY Negative  for prolonged bleeding, bruising easily, and swollen nodes. ENDOCRINE: Negative for cold or heat intolerance, polyuria, polydipsia and goiter. NEURO: negative for tremor, gait imbalance, syncope and seizures. The remainder of the review of systems is noncontributory.   Physical Exam: BP (!) 143/72   Pulse 76   Temp (!) 97.1 F (36.2 C)   Ht 5\' 6"  (1.676 m)   Wt 134 lb (60.8 kg)   BMI 21.63 kg/m  GENERAL: The patient is AO x3, in no acute distress. HEENT: Head is normocephalic and atraumatic. EOMI are intact. Mouth is well hydrated and without lesions. NECK: Supple. No masses LUNGS: Clear to auscultation. No presence of rhonchi/wheezing/rales. Adequate chest expansion HEART: RRR, normal s1 and s2. ABDOMEN: Soft, nontender, no guarding, no peritoneal signs, and nondistended. BS +. No masses. EXTREMITIES: Without any cyanosis, clubbing, rash, lesions or edema.  Using right-sided sling. NEUROLOGIC: AOx3, no focal motor deficit. SKIN: no jaundice, no rashes  Imaging/Labs: as above  I personally reviewed and interpreted the available labs, imaging and endoscopic files.  Impression and Plan: CHANIQUA BRISBY is a 63 y.o. female with PMH GERD, cognitive impairment, seizures, who presents for follow up of GERD and constipation.  Patient has presented adequate control of her GERD symptoms while taking omeprazole on a regular basis.  Will continue her on this dosage for now.  May consider decreasing her dosage in the future if symptoms are controlled.  Notably, she had baseline issues with constipation that have recently aggravated after starting opiates for postsurgical pain control.  Fortunately, the family has been able to manage this with increasing her doses of MiraLAX.  She should continue with this dosage and may consider decreasing once she is able to come off opiates.  -Continue with MiraLAX 2 capfuls in AM and 2.5 capfuls at night, may consider decreasing in the future if not using  any more hydrocodone -Continue omeprazole 40 mg every day -Proceed with repeat colonoscopy in May 2025 to allow adequate healing of shoulder surgery  All questions were answered.      Maria Blazing, MD Gastroenterology and Hepatology [facility]

## 2023-07-07 NOTE — Patient Instructions (Addendum)
Continue with MiraLAX 2 capfuls in AM and 2.5 capfuls at night, may consider decreasing in the future if not using any more hydrocodone Continue omeprazole 40 mg every day Proceed with repeat colonoscopy in May 2025 to allow adequate healing of shoulder surgery

## 2023-07-08 DIAGNOSIS — S42201D Unspecified fracture of upper end of right humerus, subsequent encounter for fracture with routine healing: Secondary | ICD-10-CM | POA: Diagnosis not present

## 2023-07-08 DIAGNOSIS — I509 Heart failure, unspecified: Secondary | ICD-10-CM | POA: Diagnosis not present

## 2023-07-08 DIAGNOSIS — D696 Thrombocytopenia, unspecified: Secondary | ICD-10-CM | POA: Diagnosis not present

## 2023-07-08 DIAGNOSIS — G40909 Epilepsy, unspecified, not intractable, without status epilepticus: Secondary | ICD-10-CM | POA: Diagnosis not present

## 2023-07-08 DIAGNOSIS — I110 Hypertensive heart disease with heart failure: Secondary | ICD-10-CM | POA: Diagnosis not present

## 2023-07-08 DIAGNOSIS — K5900 Constipation, unspecified: Secondary | ICD-10-CM | POA: Diagnosis not present

## 2023-07-14 DIAGNOSIS — G40909 Epilepsy, unspecified, not intractable, without status epilepticus: Secondary | ICD-10-CM | POA: Diagnosis not present

## 2023-07-14 DIAGNOSIS — D696 Thrombocytopenia, unspecified: Secondary | ICD-10-CM | POA: Diagnosis not present

## 2023-07-14 DIAGNOSIS — I509 Heart failure, unspecified: Secondary | ICD-10-CM | POA: Diagnosis not present

## 2023-07-14 DIAGNOSIS — I110 Hypertensive heart disease with heart failure: Secondary | ICD-10-CM | POA: Diagnosis not present

## 2023-07-14 DIAGNOSIS — K5900 Constipation, unspecified: Secondary | ICD-10-CM | POA: Diagnosis not present

## 2023-07-14 DIAGNOSIS — S42201D Unspecified fracture of upper end of right humerus, subsequent encounter for fracture with routine healing: Secondary | ICD-10-CM | POA: Diagnosis not present

## 2023-07-15 DIAGNOSIS — D696 Thrombocytopenia, unspecified: Secondary | ICD-10-CM | POA: Diagnosis not present

## 2023-07-15 DIAGNOSIS — G40909 Epilepsy, unspecified, not intractable, without status epilepticus: Secondary | ICD-10-CM | POA: Diagnosis not present

## 2023-07-15 DIAGNOSIS — I110 Hypertensive heart disease with heart failure: Secondary | ICD-10-CM | POA: Diagnosis not present

## 2023-07-15 DIAGNOSIS — S42201D Unspecified fracture of upper end of right humerus, subsequent encounter for fracture with routine healing: Secondary | ICD-10-CM | POA: Diagnosis not present

## 2023-07-15 DIAGNOSIS — K5900 Constipation, unspecified: Secondary | ICD-10-CM | POA: Diagnosis not present

## 2023-07-15 DIAGNOSIS — I509 Heart failure, unspecified: Secondary | ICD-10-CM | POA: Diagnosis not present

## 2023-07-16 DIAGNOSIS — K5900 Constipation, unspecified: Secondary | ICD-10-CM | POA: Diagnosis not present

## 2023-07-16 DIAGNOSIS — G40909 Epilepsy, unspecified, not intractable, without status epilepticus: Secondary | ICD-10-CM | POA: Diagnosis not present

## 2023-07-16 DIAGNOSIS — Z7982 Long term (current) use of aspirin: Secondary | ICD-10-CM | POA: Diagnosis not present

## 2023-07-16 DIAGNOSIS — I509 Heart failure, unspecified: Secondary | ICD-10-CM | POA: Diagnosis not present

## 2023-07-16 DIAGNOSIS — D649 Anemia, unspecified: Secondary | ICD-10-CM | POA: Diagnosis not present

## 2023-07-16 DIAGNOSIS — E782 Mixed hyperlipidemia: Secondary | ICD-10-CM | POA: Diagnosis not present

## 2023-07-16 DIAGNOSIS — Z87891 Personal history of nicotine dependence: Secondary | ICD-10-CM | POA: Diagnosis not present

## 2023-07-16 DIAGNOSIS — Z8701 Personal history of pneumonia (recurrent): Secondary | ICD-10-CM | POA: Diagnosis not present

## 2023-07-16 DIAGNOSIS — I110 Hypertensive heart disease with heart failure: Secondary | ICD-10-CM | POA: Diagnosis not present

## 2023-07-16 DIAGNOSIS — F419 Anxiety disorder, unspecified: Secondary | ICD-10-CM | POA: Diagnosis not present

## 2023-07-16 DIAGNOSIS — Z9181 History of falling: Secondary | ICD-10-CM | POA: Diagnosis not present

## 2023-07-16 DIAGNOSIS — K219 Gastro-esophageal reflux disease without esophagitis: Secondary | ICD-10-CM | POA: Diagnosis not present

## 2023-07-16 DIAGNOSIS — Z96611 Presence of right artificial shoulder joint: Secondary | ICD-10-CM | POA: Diagnosis not present

## 2023-07-16 DIAGNOSIS — D696 Thrombocytopenia, unspecified: Secondary | ICD-10-CM | POA: Diagnosis not present

## 2023-07-16 DIAGNOSIS — S42201D Unspecified fracture of upper end of right humerus, subsequent encounter for fracture with routine healing: Secondary | ICD-10-CM | POA: Diagnosis not present

## 2023-07-16 DIAGNOSIS — I051 Rheumatic mitral insufficiency: Secondary | ICD-10-CM | POA: Diagnosis not present

## 2023-07-16 DIAGNOSIS — Z556 Problems related to health literacy: Secondary | ICD-10-CM | POA: Diagnosis not present

## 2023-07-16 DIAGNOSIS — Z791 Long term (current) use of non-steroidal anti-inflammatories (NSAID): Secondary | ICD-10-CM | POA: Diagnosis not present

## 2023-07-21 ENCOUNTER — Other Ambulatory Visit: Payer: Self-pay | Admitting: Neurology

## 2023-07-21 DIAGNOSIS — S42201D Unspecified fracture of upper end of right humerus, subsequent encounter for fracture with routine healing: Secondary | ICD-10-CM | POA: Diagnosis not present

## 2023-07-21 DIAGNOSIS — G40909 Epilepsy, unspecified, not intractable, without status epilepticus: Secondary | ICD-10-CM | POA: Diagnosis not present

## 2023-07-21 DIAGNOSIS — I110 Hypertensive heart disease with heart failure: Secondary | ICD-10-CM | POA: Diagnosis not present

## 2023-07-21 DIAGNOSIS — I509 Heart failure, unspecified: Secondary | ICD-10-CM | POA: Diagnosis not present

## 2023-07-21 DIAGNOSIS — K5900 Constipation, unspecified: Secondary | ICD-10-CM | POA: Diagnosis not present

## 2023-07-21 DIAGNOSIS — D696 Thrombocytopenia, unspecified: Secondary | ICD-10-CM | POA: Diagnosis not present

## 2023-07-21 DIAGNOSIS — R569 Unspecified convulsions: Secondary | ICD-10-CM

## 2023-07-21 MED ORDER — LACOSAMIDE 200 MG PO TABS
200.0000 mg | ORAL_TABLET | Freq: Two times a day (BID) | ORAL | 1 refills | Status: DC
Start: 2023-07-21 — End: 2023-11-10

## 2023-07-21 NOTE — Telephone Encounter (Signed)
Pt's mother called stating that the pt is needing a new Rx for the pt's lacosamide (VIMPAT) 200 MG TABS tablet and it is needing to be sent to the [facility] Please advise.

## 2023-07-21 NOTE — Telephone Encounter (Signed)
Last seen 11/12/22, next appt 08/27/23  Dispenses   Dispensed Days Supply Quantity Provider Pharmacy  LACOSAMIDE 200 MG TABLET 04/26/2023 90 180 tablet Glean Salvo, NP [facility]  LACOSAMIDE 200 MG TABLET 01/21/2023 90 180 tablet Glean Salvo, NP [facility]  LACOSAMIDE 200 MG TABLET 10/27/2022 90 180 tablet Glean Salvo, NP [facility]  LACOSAMIDE 200 MG TABLET 08/08/2022 90 180 tablet Glean Salvo, NP [facility]Marland KitchenMarland Kitchen

## 2023-07-22 DIAGNOSIS — S42201D Unspecified fracture of upper end of right humerus, subsequent encounter for fracture with routine healing: Secondary | ICD-10-CM | POA: Diagnosis not present

## 2023-07-29 DIAGNOSIS — I509 Heart failure, unspecified: Secondary | ICD-10-CM | POA: Diagnosis not present

## 2023-07-29 DIAGNOSIS — S42201D Unspecified fracture of upper end of right humerus, subsequent encounter for fracture with routine healing: Secondary | ICD-10-CM | POA: Diagnosis not present

## 2023-07-29 DIAGNOSIS — D696 Thrombocytopenia, unspecified: Secondary | ICD-10-CM | POA: Diagnosis not present

## 2023-07-29 DIAGNOSIS — G40909 Epilepsy, unspecified, not intractable, without status epilepticus: Secondary | ICD-10-CM | POA: Diagnosis not present

## 2023-07-29 DIAGNOSIS — I110 Hypertensive heart disease with heart failure: Secondary | ICD-10-CM | POA: Diagnosis not present

## 2023-07-29 DIAGNOSIS — K5900 Constipation, unspecified: Secondary | ICD-10-CM | POA: Diagnosis not present

## 2023-07-31 DIAGNOSIS — D696 Thrombocytopenia, unspecified: Secondary | ICD-10-CM | POA: Diagnosis not present

## 2023-07-31 DIAGNOSIS — G40909 Epilepsy, unspecified, not intractable, without status epilepticus: Secondary | ICD-10-CM | POA: Diagnosis not present

## 2023-07-31 DIAGNOSIS — K5900 Constipation, unspecified: Secondary | ICD-10-CM | POA: Diagnosis not present

## 2023-07-31 DIAGNOSIS — S42201D Unspecified fracture of upper end of right humerus, subsequent encounter for fracture with routine healing: Secondary | ICD-10-CM | POA: Diagnosis not present

## 2023-07-31 DIAGNOSIS — I110 Hypertensive heart disease with heart failure: Secondary | ICD-10-CM | POA: Diagnosis not present

## 2023-07-31 DIAGNOSIS — I509 Heart failure, unspecified: Secondary | ICD-10-CM | POA: Diagnosis not present

## 2023-08-04 DIAGNOSIS — K5900 Constipation, unspecified: Secondary | ICD-10-CM | POA: Diagnosis not present

## 2023-08-04 DIAGNOSIS — S42201D Unspecified fracture of upper end of right humerus, subsequent encounter for fracture with routine healing: Secondary | ICD-10-CM | POA: Diagnosis not present

## 2023-08-04 DIAGNOSIS — G40909 Epilepsy, unspecified, not intractable, without status epilepticus: Secondary | ICD-10-CM | POA: Diagnosis not present

## 2023-08-04 DIAGNOSIS — I110 Hypertensive heart disease with heart failure: Secondary | ICD-10-CM | POA: Diagnosis not present

## 2023-08-04 DIAGNOSIS — I509 Heart failure, unspecified: Secondary | ICD-10-CM | POA: Diagnosis not present

## 2023-08-04 DIAGNOSIS — D696 Thrombocytopenia, unspecified: Secondary | ICD-10-CM | POA: Diagnosis not present

## 2023-08-04 NOTE — Progress Notes (Signed)
 History of Present Illness: Here for f/u of urinary frequency. At last visit she was given samples of Gemtesa. On oxybutynin she had frequent need for enemas d/t constipation.  She has been maintained on Gemtesa for her LUTS.  With this, which is well-tolerated, her urinary symptoms are tolerable.  She has had no recent symptoms of UTI and no blood in her urine.  She is here today with her brother.  Past Medical History:  Diagnosis Date   Constipation    Dysphagia    Fracture    R foot   GERD (gastroesophageal reflux disease)    Headache    History of shingles 10/2016   Mental retardation    lesion in head   Pneumonia    Seizures (HCC)    Last seizure 05/31/23   Thrombocytopenia (HCC)    Tremor     Past Surgical History:  Procedure Laterality Date   BIOPSY  09/16/2014   Procedure: BIOPSY;  Surgeon: Malissa Hippo, MD;  [facility];  Service: Endoscopy;;   CATARACT EXTRACTION     both eyes, May of 2015   COLONOSCOPY     COLONOSCOPY WITH PROPOFOL N/A 11/20/2018   Procedure: COLONOSCOPY WITH PROPOFOL;  Surgeon: Malissa Hippo, MD;  [facility];  Service: Endoscopy;  Laterality: N/A;   COLONOSCOPY WITH PROPOFOL N/A 08/07/2022   Procedure: COLONOSCOPY WITH PROPOFOL;  Surgeon: Dolores Frame, MD;  [facility];  Service: Gastroenterology;  Laterality: N/A;  7:30am, asa 1-2   ESOPHAGEAL DILATION N/A 09/16/2014   Procedure: ESOPHAGEAL DILATION WITH 54FR MALONEY DILATOR;  Surgeon: Malissa Hippo, MD;  [facility];  Service: Endoscopy;  Laterality: N/A;   ESOPHAGOGASTRODUODENOSCOPY (EGD) WITH PROPOFOL N/A 09/16/2014   Procedure: ESOPHAGOGASTRODUODENOSCOPY (EGD) WITH PROPOFOL;  Surgeon: Malissa Hippo, MD;  [facility];  Service: Endoscopy;  Laterality: N/A;   HEMOSTASIS CLIP PLACEMENT  08/07/2022   Procedure: HEMOSTASIS CLIP PLACEMENT;  Surgeon: Dolores Frame, MD;  [facility];  Service: Gastroenterology;;    LAPAROSCOPIC APPENDECTOMY N/A 07/14/2020   Procedure: APPENDECTOMY LAPAROSCOPIC;  Surgeon: Lucretia Roers, MD;  [facility];  Service: General;  Laterality: N/A;   MOUTH SURGERY     POLYPECTOMY  11/20/2018   Procedure: POLYPECTOMY;  Surgeon: Malissa Hippo, MD;  [facility];  Service: Endoscopy;;  colon   POLYPECTOMY  08/07/2022   Procedure: POLYPECTOMY;  Surgeon: Dolores Frame, MD;  [facility];  Service: Gastroenterology;;   REVERSE SHOULDER ARTHROPLASTY Right 06/12/2023   Procedure: REVERSE SHOULDER ARTHROPLASTY;  Surgeon: Bjorn Pippin, MD;  [facility]: [facility];  Service: Orthopedics;  Laterality: Right;   Skin graft to gum Right 08/2013   SUBMUCOSAL LIFTING INJECTION  08/07/2022   Procedure: SUBMUCOSAL LIFTING INJECTION;  Surgeon: Dolores Frame, MD;  [facility];  Service: Gastroenterology;;   TONSILLECTOMY AND ADENOIDECTOMY     TOTAL ABDOMINAL HYSTERECTOMY      Home Medications:  Allergies as of 08/05/2023       Reactions   Codeine Nausea And Vomiting   Lamictal [lamotrigine] Rash        Medication List        Accurate as of August 04, 2023  9:02 AM. If you have any questions, ask your nurse or doctor.          aspirin EC 81 MG tablet Take 81 mg by mouth daily. Swallow whole.   atorvastatin 20 MG tablet  Commonly known as: LIPITOR Take 20 mg by mouth at bedtime.   calcium carbonate 500 MG chewable tablet Commonly known as: TUMS - dosed in mg elemental calcium Chew 1 tablet by mouth daily as needed for indigestion or heartburn.   CALCIUM/VITAMIN D PO Take 1 tablet by mouth 2 (two) times daily. 600 mg / 800 units   cholecalciferol 25 MCG (1000 UNIT) tablet Commonly known as: VITAMIN D3 Take 1,000 Units by mouth 2 (two) times daily.   clonazePAM 0.5 MG tablet Commonly known as: KLONOPIN Take 1 tablet (0.5 mg total) by mouth 2 (two) times daily.   divalproex 500 MG DR tablet Commonly known as:  DEPAKOTE TAKE 2 TABLETS (1,000 MG TOTAL) BY MOUTH DAILY. What changed:  how much to take how to take this when to take this   HYDROcodone-acetaminophen 5-325 MG tablet Commonly known as: Norco Take 1-2 tablets by mouth every 6 (six) hours as needed for severe pain (pain score 7-10).   lacosamide 200 MG Tabs tablet Commonly known as: VIMPAT Take 1 tablet (200 mg total) by mouth 2 (two) times daily.   multivitamin capsule Take 1 capsule by mouth daily.   omeprazole 40 MG capsule Commonly known as: PRILOSEC Take 1 capsule (40 mg total) by mouth daily.   ondansetron 4 MG disintegrating tablet Commonly known as: ZOFRAN-ODT Take 1 tablet (4 mg total) by mouth every 8 (eight) hours as needed for nausea or vomiting.   senna 8.6 MG tablet Commonly known as: SENOKOT Take 2 tablets by mouth daily.        Allergies:  Allergies  Allergen Reactions   Codeine Nausea And Vomiting   Lamictal [Lamotrigine] Rash    Family History  Problem Relation Age of Onset   High Cholesterol Mother    High blood pressure Mother    Diabetes Father     Social History:  reports that she quit smoking about 17 years ago. Her smoking use included cigarettes. She started smoking about 32 years ago. She has a 22.5 pack-year smoking history. She has never used smokeless tobacco. She reports that she does not drink alcohol and does not use drugs.  ROS: A complete review of systems was performed.  All systems are negative except for pertinent findings as noted.  Physical Exam:  Vital signs in last 24 hours: There were no vitals taken for this visit. Constitutional:  Alert and oriented, No acute distress Cardiovascular: Regular rate  Respiratory: Normal respiratory effort Neurologic: Grossly intact, no focal deficits Psychiatric: Normal mood and affect  I have reviewed prior pt notes  I have reviewed urinalysis results  I have independently reviewed prior imaging--bladder scan.  Negligible  residual urine volume today    Impression/Assessment:  OAB sx's--treated with Leslye Peer  Plan:  They will have their scheduled appointment in October  OV 1 year

## 2023-08-05 ENCOUNTER — Ambulatory Visit (INDEPENDENT_AMBULATORY_CARE_PROVIDER_SITE_OTHER): Payer: Medicare Other | Admitting: Urology

## 2023-08-05 VITALS — BP 164/82 | HR 101

## 2023-08-05 DIAGNOSIS — R350 Frequency of micturition: Secondary | ICD-10-CM | POA: Diagnosis not present

## 2023-08-05 LAB — BLADDER SCAN AMB NON-IMAGING: Scan Result: 57

## 2023-08-05 MED ORDER — GEMTESA 75 MG PO TABS: 1.0000 | ORAL_TABLET | Freq: Every day | ORAL | Status: DC

## 2023-08-05 NOTE — Progress Notes (Signed)
 Bladder Scan completed today.  Patient cannot void prior to the bladder scan. Bladder scan result: 57  Performed By: Alfonse Spruce. CMA  Additional notes-

## 2023-08-11 DIAGNOSIS — S42201D Unspecified fracture of upper end of right humerus, subsequent encounter for fracture with routine healing: Secondary | ICD-10-CM | POA: Diagnosis not present

## 2023-08-11 DIAGNOSIS — I110 Hypertensive heart disease with heart failure: Secondary | ICD-10-CM | POA: Diagnosis not present

## 2023-08-11 DIAGNOSIS — I509 Heart failure, unspecified: Secondary | ICD-10-CM | POA: Diagnosis not present

## 2023-08-11 DIAGNOSIS — K5900 Constipation, unspecified: Secondary | ICD-10-CM | POA: Diagnosis not present

## 2023-08-11 DIAGNOSIS — G40909 Epilepsy, unspecified, not intractable, without status epilepticus: Secondary | ICD-10-CM | POA: Diagnosis not present

## 2023-08-11 DIAGNOSIS — D696 Thrombocytopenia, unspecified: Secondary | ICD-10-CM | POA: Diagnosis not present

## 2023-08-15 DIAGNOSIS — I509 Heart failure, unspecified: Secondary | ICD-10-CM | POA: Diagnosis not present

## 2023-08-15 DIAGNOSIS — Z8701 Personal history of pneumonia (recurrent): Secondary | ICD-10-CM | POA: Diagnosis not present

## 2023-08-15 DIAGNOSIS — K5900 Constipation, unspecified: Secondary | ICD-10-CM | POA: Diagnosis not present

## 2023-08-15 DIAGNOSIS — Z87891 Personal history of nicotine dependence: Secondary | ICD-10-CM | POA: Diagnosis not present

## 2023-08-15 DIAGNOSIS — Z556 Problems related to health literacy: Secondary | ICD-10-CM | POA: Diagnosis not present

## 2023-08-15 DIAGNOSIS — R1310 Dysphagia, unspecified: Secondary | ICD-10-CM | POA: Diagnosis not present

## 2023-08-15 DIAGNOSIS — S42201D Unspecified fracture of upper end of right humerus, subsequent encounter for fracture with routine healing: Secondary | ICD-10-CM | POA: Diagnosis not present

## 2023-08-15 DIAGNOSIS — D696 Thrombocytopenia, unspecified: Secondary | ICD-10-CM | POA: Diagnosis not present

## 2023-08-15 DIAGNOSIS — I051 Rheumatic mitral insufficiency: Secondary | ICD-10-CM | POA: Diagnosis not present

## 2023-08-15 DIAGNOSIS — Z7982 Long term (current) use of aspirin: Secondary | ICD-10-CM | POA: Diagnosis not present

## 2023-08-15 DIAGNOSIS — Z9181 History of falling: Secondary | ICD-10-CM | POA: Diagnosis not present

## 2023-08-15 DIAGNOSIS — I110 Hypertensive heart disease with heart failure: Secondary | ICD-10-CM | POA: Diagnosis not present

## 2023-08-15 DIAGNOSIS — D649 Anemia, unspecified: Secondary | ICD-10-CM | POA: Diagnosis not present

## 2023-08-15 DIAGNOSIS — R4189 Other symptoms and signs involving cognitive functions and awareness: Secondary | ICD-10-CM | POA: Diagnosis not present

## 2023-08-15 DIAGNOSIS — E782 Mixed hyperlipidemia: Secondary | ICD-10-CM | POA: Diagnosis not present

## 2023-08-15 DIAGNOSIS — F419 Anxiety disorder, unspecified: Secondary | ICD-10-CM | POA: Diagnosis not present

## 2023-08-15 DIAGNOSIS — Z791 Long term (current) use of non-steroidal anti-inflammatories (NSAID): Secondary | ICD-10-CM | POA: Diagnosis not present

## 2023-08-15 DIAGNOSIS — K219 Gastro-esophageal reflux disease without esophagitis: Secondary | ICD-10-CM | POA: Diagnosis not present

## 2023-08-15 DIAGNOSIS — G40909 Epilepsy, unspecified, not intractable, without status epilepticus: Secondary | ICD-10-CM | POA: Diagnosis not present

## 2023-08-15 DIAGNOSIS — Z96611 Presence of right artificial shoulder joint: Secondary | ICD-10-CM | POA: Diagnosis not present

## 2023-08-21 DIAGNOSIS — D696 Thrombocytopenia, unspecified: Secondary | ICD-10-CM | POA: Diagnosis not present

## 2023-08-21 DIAGNOSIS — K5900 Constipation, unspecified: Secondary | ICD-10-CM | POA: Diagnosis not present

## 2023-08-21 DIAGNOSIS — I110 Hypertensive heart disease with heart failure: Secondary | ICD-10-CM | POA: Diagnosis not present

## 2023-08-21 DIAGNOSIS — I509 Heart failure, unspecified: Secondary | ICD-10-CM | POA: Diagnosis not present

## 2023-08-21 DIAGNOSIS — S42201D Unspecified fracture of upper end of right humerus, subsequent encounter for fracture with routine healing: Secondary | ICD-10-CM | POA: Diagnosis not present

## 2023-08-21 DIAGNOSIS — G40909 Epilepsy, unspecified, not intractable, without status epilepticus: Secondary | ICD-10-CM | POA: Diagnosis not present

## 2023-08-25 DIAGNOSIS — I110 Hypertensive heart disease with heart failure: Secondary | ICD-10-CM | POA: Diagnosis not present

## 2023-08-25 DIAGNOSIS — D696 Thrombocytopenia, unspecified: Secondary | ICD-10-CM | POA: Diagnosis not present

## 2023-08-25 DIAGNOSIS — K5900 Constipation, unspecified: Secondary | ICD-10-CM | POA: Diagnosis not present

## 2023-08-25 DIAGNOSIS — S42201D Unspecified fracture of upper end of right humerus, subsequent encounter for fracture with routine healing: Secondary | ICD-10-CM | POA: Diagnosis not present

## 2023-08-25 DIAGNOSIS — G40909 Epilepsy, unspecified, not intractable, without status epilepticus: Secondary | ICD-10-CM | POA: Diagnosis not present

## 2023-08-25 DIAGNOSIS — I509 Heart failure, unspecified: Secondary | ICD-10-CM | POA: Diagnosis not present

## 2023-08-27 ENCOUNTER — Institutional Professional Consult (permissible substitution): Payer: Medicare Other | Admitting: Neurology

## 2023-08-27 ENCOUNTER — Encounter: Payer: Self-pay | Admitting: Neurology

## 2023-09-02 DIAGNOSIS — I509 Heart failure, unspecified: Secondary | ICD-10-CM | POA: Diagnosis not present

## 2023-09-02 DIAGNOSIS — G40909 Epilepsy, unspecified, not intractable, without status epilepticus: Secondary | ICD-10-CM | POA: Diagnosis not present

## 2023-09-02 DIAGNOSIS — K5900 Constipation, unspecified: Secondary | ICD-10-CM | POA: Diagnosis not present

## 2023-09-02 DIAGNOSIS — S42201D Unspecified fracture of upper end of right humerus, subsequent encounter for fracture with routine healing: Secondary | ICD-10-CM | POA: Diagnosis not present

## 2023-09-02 DIAGNOSIS — I110 Hypertensive heart disease with heart failure: Secondary | ICD-10-CM | POA: Diagnosis not present

## 2023-09-02 DIAGNOSIS — D696 Thrombocytopenia, unspecified: Secondary | ICD-10-CM | POA: Diagnosis not present

## 2023-09-08 DIAGNOSIS — D696 Thrombocytopenia, unspecified: Secondary | ICD-10-CM | POA: Diagnosis not present

## 2023-09-08 DIAGNOSIS — S42201D Unspecified fracture of upper end of right humerus, subsequent encounter for fracture with routine healing: Secondary | ICD-10-CM | POA: Diagnosis not present

## 2023-09-08 DIAGNOSIS — I509 Heart failure, unspecified: Secondary | ICD-10-CM | POA: Diagnosis not present

## 2023-09-08 DIAGNOSIS — K5900 Constipation, unspecified: Secondary | ICD-10-CM | POA: Diagnosis not present

## 2023-09-08 DIAGNOSIS — I110 Hypertensive heart disease with heart failure: Secondary | ICD-10-CM | POA: Diagnosis not present

## 2023-09-08 DIAGNOSIS — G40909 Epilepsy, unspecified, not intractable, without status epilepticus: Secondary | ICD-10-CM | POA: Diagnosis not present

## 2023-09-09 DIAGNOSIS — S93402D Sprain of unspecified ligament of left ankle, subsequent encounter: Secondary | ICD-10-CM | POA: Diagnosis not present

## 2023-09-09 DIAGNOSIS — X58XXXD Exposure to other specified factors, subsequent encounter: Secondary | ICD-10-CM | POA: Diagnosis not present

## 2023-09-09 DIAGNOSIS — S93402A Sprain of unspecified ligament of left ankle, initial encounter: Secondary | ICD-10-CM | POA: Insufficient documentation

## 2023-09-11 ENCOUNTER — Encounter (INDEPENDENT_AMBULATORY_CARE_PROVIDER_SITE_OTHER): Payer: Self-pay | Admitting: *Deleted

## 2023-09-14 ENCOUNTER — Other Ambulatory Visit: Payer: Self-pay | Admitting: Neurology

## 2023-09-14 DIAGNOSIS — R1310 Dysphagia, unspecified: Secondary | ICD-10-CM | POA: Diagnosis not present

## 2023-09-14 DIAGNOSIS — Z791 Long term (current) use of non-steroidal anti-inflammatories (NSAID): Secondary | ICD-10-CM | POA: Diagnosis not present

## 2023-09-14 DIAGNOSIS — R4189 Other symptoms and signs involving cognitive functions and awareness: Secondary | ICD-10-CM | POA: Diagnosis not present

## 2023-09-14 DIAGNOSIS — F419 Anxiety disorder, unspecified: Secondary | ICD-10-CM | POA: Diagnosis not present

## 2023-09-14 DIAGNOSIS — Z87891 Personal history of nicotine dependence: Secondary | ICD-10-CM | POA: Diagnosis not present

## 2023-09-14 DIAGNOSIS — Z8701 Personal history of pneumonia (recurrent): Secondary | ICD-10-CM | POA: Diagnosis not present

## 2023-09-14 DIAGNOSIS — K5900 Constipation, unspecified: Secondary | ICD-10-CM | POA: Diagnosis not present

## 2023-09-14 DIAGNOSIS — Z7982 Long term (current) use of aspirin: Secondary | ICD-10-CM | POA: Diagnosis not present

## 2023-09-14 DIAGNOSIS — Z96611 Presence of right artificial shoulder joint: Secondary | ICD-10-CM | POA: Diagnosis not present

## 2023-09-14 DIAGNOSIS — D696 Thrombocytopenia, unspecified: Secondary | ICD-10-CM | POA: Diagnosis not present

## 2023-09-14 DIAGNOSIS — Z9181 History of falling: Secondary | ICD-10-CM | POA: Diagnosis not present

## 2023-09-14 DIAGNOSIS — I051 Rheumatic mitral insufficiency: Secondary | ICD-10-CM | POA: Diagnosis not present

## 2023-09-14 DIAGNOSIS — S42201D Unspecified fracture of upper end of right humerus, subsequent encounter for fracture with routine healing: Secondary | ICD-10-CM | POA: Diagnosis not present

## 2023-09-14 DIAGNOSIS — G40909 Epilepsy, unspecified, not intractable, without status epilepticus: Secondary | ICD-10-CM | POA: Diagnosis not present

## 2023-09-14 DIAGNOSIS — I110 Hypertensive heart disease with heart failure: Secondary | ICD-10-CM | POA: Diagnosis not present

## 2023-09-14 DIAGNOSIS — K219 Gastro-esophageal reflux disease without esophagitis: Secondary | ICD-10-CM | POA: Diagnosis not present

## 2023-09-14 DIAGNOSIS — Z556 Problems related to health literacy: Secondary | ICD-10-CM | POA: Diagnosis not present

## 2023-09-14 DIAGNOSIS — E782 Mixed hyperlipidemia: Secondary | ICD-10-CM | POA: Diagnosis not present

## 2023-09-14 DIAGNOSIS — I509 Heart failure, unspecified: Secondary | ICD-10-CM | POA: Diagnosis not present

## 2023-09-14 DIAGNOSIS — D649 Anemia, unspecified: Secondary | ICD-10-CM | POA: Diagnosis not present

## 2023-09-17 ENCOUNTER — Telehealth: Payer: Self-pay | Admitting: Neurology

## 2023-09-17 DIAGNOSIS — D696 Thrombocytopenia, unspecified: Secondary | ICD-10-CM | POA: Diagnosis not present

## 2023-09-17 DIAGNOSIS — I509 Heart failure, unspecified: Secondary | ICD-10-CM | POA: Diagnosis not present

## 2023-09-17 DIAGNOSIS — K5900 Constipation, unspecified: Secondary | ICD-10-CM | POA: Diagnosis not present

## 2023-09-17 DIAGNOSIS — G40909 Epilepsy, unspecified, not intractable, without status epilepticus: Secondary | ICD-10-CM | POA: Diagnosis not present

## 2023-09-17 DIAGNOSIS — I110 Hypertensive heart disease with heart failure: Secondary | ICD-10-CM | POA: Diagnosis not present

## 2023-09-17 DIAGNOSIS — S42201D Unspecified fracture of upper end of right humerus, subsequent encounter for fracture with routine healing: Secondary | ICD-10-CM | POA: Diagnosis not present

## 2023-09-17 NOTE — Telephone Encounter (Signed)
 Pt's sister is asking for a call to discuss where things stand on the refill request for divalproex (DEPAKOTE) 500 MG DR tablet

## 2023-09-18 MED ORDER — DIVALPROEX SODIUM 500 MG PO DR TAB
1000.0000 mg | DELAYED_RELEASE_TABLET | Freq: Every day | ORAL | 0 refills | Status: AC
Start: 2023-09-18 — End: 2024-09-17

## 2023-09-23 DIAGNOSIS — S42201D Unspecified fracture of upper end of right humerus, subsequent encounter for fracture with routine healing: Secondary | ICD-10-CM | POA: Diagnosis not present

## 2023-09-23 NOTE — Telephone Encounter (Signed)
 Error

## 2023-09-26 DIAGNOSIS — S42201D Unspecified fracture of upper end of right humerus, subsequent encounter for fracture with routine healing: Secondary | ICD-10-CM | POA: Diagnosis not present

## 2023-09-26 DIAGNOSIS — I509 Heart failure, unspecified: Secondary | ICD-10-CM | POA: Diagnosis not present

## 2023-09-26 DIAGNOSIS — D696 Thrombocytopenia, unspecified: Secondary | ICD-10-CM | POA: Diagnosis not present

## 2023-09-26 DIAGNOSIS — I110 Hypertensive heart disease with heart failure: Secondary | ICD-10-CM | POA: Diagnosis not present

## 2023-09-26 DIAGNOSIS — G40909 Epilepsy, unspecified, not intractable, without status epilepticus: Secondary | ICD-10-CM | POA: Diagnosis not present

## 2023-09-26 DIAGNOSIS — K5900 Constipation, unspecified: Secondary | ICD-10-CM | POA: Diagnosis not present

## 2023-10-01 DIAGNOSIS — K5900 Constipation, unspecified: Secondary | ICD-10-CM | POA: Diagnosis not present

## 2023-10-01 DIAGNOSIS — I110 Hypertensive heart disease with heart failure: Secondary | ICD-10-CM | POA: Diagnosis not present

## 2023-10-01 DIAGNOSIS — G40909 Epilepsy, unspecified, not intractable, without status epilepticus: Secondary | ICD-10-CM | POA: Diagnosis not present

## 2023-10-01 DIAGNOSIS — D696 Thrombocytopenia, unspecified: Secondary | ICD-10-CM | POA: Diagnosis not present

## 2023-10-01 DIAGNOSIS — I509 Heart failure, unspecified: Secondary | ICD-10-CM | POA: Diagnosis not present

## 2023-10-01 DIAGNOSIS — S42201D Unspecified fracture of upper end of right humerus, subsequent encounter for fracture with routine healing: Secondary | ICD-10-CM | POA: Diagnosis not present

## 2023-10-09 DIAGNOSIS — S42201D Unspecified fracture of upper end of right humerus, subsequent encounter for fracture with routine healing: Secondary | ICD-10-CM | POA: Diagnosis not present

## 2023-10-09 DIAGNOSIS — D696 Thrombocytopenia, unspecified: Secondary | ICD-10-CM | POA: Diagnosis not present

## 2023-10-09 DIAGNOSIS — K5900 Constipation, unspecified: Secondary | ICD-10-CM | POA: Diagnosis not present

## 2023-10-09 DIAGNOSIS — G40909 Epilepsy, unspecified, not intractable, without status epilepticus: Secondary | ICD-10-CM | POA: Diagnosis not present

## 2023-10-09 DIAGNOSIS — I110 Hypertensive heart disease with heart failure: Secondary | ICD-10-CM | POA: Diagnosis not present

## 2023-10-09 DIAGNOSIS — I509 Heart failure, unspecified: Secondary | ICD-10-CM | POA: Diagnosis not present

## 2023-10-14 DIAGNOSIS — R1310 Dysphagia, unspecified: Secondary | ICD-10-CM | POA: Diagnosis not present

## 2023-10-14 DIAGNOSIS — S42201D Unspecified fracture of upper end of right humerus, subsequent encounter for fracture with routine healing: Secondary | ICD-10-CM | POA: Diagnosis not present

## 2023-10-14 DIAGNOSIS — Z8701 Personal history of pneumonia (recurrent): Secondary | ICD-10-CM | POA: Diagnosis not present

## 2023-10-14 DIAGNOSIS — Z791 Long term (current) use of non-steroidal anti-inflammatories (NSAID): Secondary | ICD-10-CM | POA: Diagnosis not present

## 2023-10-14 DIAGNOSIS — K219 Gastro-esophageal reflux disease without esophagitis: Secondary | ICD-10-CM | POA: Diagnosis not present

## 2023-10-14 DIAGNOSIS — F419 Anxiety disorder, unspecified: Secondary | ICD-10-CM | POA: Diagnosis not present

## 2023-10-14 DIAGNOSIS — D649 Anemia, unspecified: Secondary | ICD-10-CM | POA: Diagnosis not present

## 2023-10-14 DIAGNOSIS — Z7982 Long term (current) use of aspirin: Secondary | ICD-10-CM | POA: Diagnosis not present

## 2023-10-14 DIAGNOSIS — I509 Heart failure, unspecified: Secondary | ICD-10-CM | POA: Diagnosis not present

## 2023-10-14 DIAGNOSIS — K5900 Constipation, unspecified: Secondary | ICD-10-CM | POA: Diagnosis not present

## 2023-10-14 DIAGNOSIS — I051 Rheumatic mitral insufficiency: Secondary | ICD-10-CM | POA: Diagnosis not present

## 2023-10-14 DIAGNOSIS — E782 Mixed hyperlipidemia: Secondary | ICD-10-CM | POA: Diagnosis not present

## 2023-10-14 DIAGNOSIS — Z556 Problems related to health literacy: Secondary | ICD-10-CM | POA: Diagnosis not present

## 2023-10-14 DIAGNOSIS — Z9181 History of falling: Secondary | ICD-10-CM | POA: Diagnosis not present

## 2023-10-14 DIAGNOSIS — Z87891 Personal history of nicotine dependence: Secondary | ICD-10-CM | POA: Diagnosis not present

## 2023-10-14 DIAGNOSIS — G40909 Epilepsy, unspecified, not intractable, without status epilepticus: Secondary | ICD-10-CM | POA: Diagnosis not present

## 2023-10-14 DIAGNOSIS — Z96611 Presence of right artificial shoulder joint: Secondary | ICD-10-CM | POA: Diagnosis not present

## 2023-10-14 DIAGNOSIS — D696 Thrombocytopenia, unspecified: Secondary | ICD-10-CM | POA: Diagnosis not present

## 2023-10-14 DIAGNOSIS — I110 Hypertensive heart disease with heart failure: Secondary | ICD-10-CM | POA: Diagnosis not present

## 2023-10-15 DIAGNOSIS — K5900 Constipation, unspecified: Secondary | ICD-10-CM | POA: Diagnosis not present

## 2023-10-15 DIAGNOSIS — G40909 Epilepsy, unspecified, not intractable, without status epilepticus: Secondary | ICD-10-CM | POA: Diagnosis not present

## 2023-10-15 DIAGNOSIS — S42201D Unspecified fracture of upper end of right humerus, subsequent encounter for fracture with routine healing: Secondary | ICD-10-CM | POA: Diagnosis not present

## 2023-10-15 DIAGNOSIS — D696 Thrombocytopenia, unspecified: Secondary | ICD-10-CM | POA: Diagnosis not present

## 2023-10-15 DIAGNOSIS — I110 Hypertensive heart disease with heart failure: Secondary | ICD-10-CM | POA: Diagnosis not present

## 2023-10-15 DIAGNOSIS — I509 Heart failure, unspecified: Secondary | ICD-10-CM | POA: Diagnosis not present

## 2023-10-24 DIAGNOSIS — G40909 Epilepsy, unspecified, not intractable, without status epilepticus: Secondary | ICD-10-CM | POA: Diagnosis not present

## 2023-10-24 DIAGNOSIS — K5900 Constipation, unspecified: Secondary | ICD-10-CM | POA: Diagnosis not present

## 2023-10-24 DIAGNOSIS — S42201D Unspecified fracture of upper end of right humerus, subsequent encounter for fracture with routine healing: Secondary | ICD-10-CM | POA: Diagnosis not present

## 2023-10-24 DIAGNOSIS — I509 Heart failure, unspecified: Secondary | ICD-10-CM | POA: Diagnosis not present

## 2023-10-24 DIAGNOSIS — I110 Hypertensive heart disease with heart failure: Secondary | ICD-10-CM | POA: Diagnosis not present

## 2023-10-24 DIAGNOSIS — D696 Thrombocytopenia, unspecified: Secondary | ICD-10-CM | POA: Diagnosis not present

## 2023-10-30 ENCOUNTER — Other Ambulatory Visit: Payer: Self-pay | Admitting: Neurology

## 2023-10-30 ENCOUNTER — Other Ambulatory Visit (INDEPENDENT_AMBULATORY_CARE_PROVIDER_SITE_OTHER): Payer: Self-pay | Admitting: Gastroenterology

## 2023-10-30 DIAGNOSIS — K219 Gastro-esophageal reflux disease without esophagitis: Secondary | ICD-10-CM

## 2023-10-30 DIAGNOSIS — R569 Unspecified convulsions: Secondary | ICD-10-CM

## 2023-10-30 NOTE — Telephone Encounter (Signed)
 Requested Prescriptions   Pending Prescriptions Disp Refills   lacosamide  (VIMPAT ) 200 MG TABS tablet [Pharmacy Med Name: LACOSAMIDE  TABS 200MG ] 180 tablet 1    Sig: TAKE 1 TABLET TWICE A DAY   Last seen 11/12/22, next appt 12/22/23  Dispenses   Dispensed Days Supply Quantity Provider Pharmacy  LACOSAMIDE  200 MG TABLET 07/22/2023 90 180 tablet [name], NP [facility]  LACOSAMIDE  200 MG TABLET 04/26/2023 90 180 tablet [name], NP [facility]  LACOSAMIDE  200 MG TABLET 01/21/2023 90 180 tablet [name], NP [facility].Aaron AasAaron Aas

## 2023-10-30 NOTE — Telephone Encounter (Signed)
 Refill to soon refused

## 2023-10-31 DIAGNOSIS — S42201D Unspecified fracture of upper end of right humerus, subsequent encounter for fracture with routine healing: Secondary | ICD-10-CM | POA: Diagnosis not present

## 2023-10-31 DIAGNOSIS — I110 Hypertensive heart disease with heart failure: Secondary | ICD-10-CM | POA: Diagnosis not present

## 2023-10-31 DIAGNOSIS — K5900 Constipation, unspecified: Secondary | ICD-10-CM | POA: Diagnosis not present

## 2023-10-31 DIAGNOSIS — G40909 Epilepsy, unspecified, not intractable, without status epilepticus: Secondary | ICD-10-CM | POA: Diagnosis not present

## 2023-10-31 DIAGNOSIS — D696 Thrombocytopenia, unspecified: Secondary | ICD-10-CM | POA: Diagnosis not present

## 2023-10-31 DIAGNOSIS — I509 Heart failure, unspecified: Secondary | ICD-10-CM | POA: Diagnosis not present

## 2023-11-03 ENCOUNTER — Other Ambulatory Visit: Payer: Self-pay

## 2023-11-03 NOTE — Telephone Encounter (Signed)
 Requested Prescriptions   Pending Prescriptions Disp Refills   clonazePAM  (KLONOPIN ) 0.5 MG tablet 14 tablet 0    Sig: Take 1 tablet (0.5 mg total) by mouth 2 (two) times daily.   Last seen 11/12/22, next appt 12/22/23 Dispenses   Dispensed Days Supply Quantity Provider Pharmacy  CLONAZEPAM  0.5 MG TABLET 07/25/2023 90 180 tablet [name], NP [facility]  CLONAZEPAM  0.5 MG TABLET 04/28/2023 90 180 tablet [name], NP [facility]  CLONAZEPAM  0.5MG  TABLETS 04/28/2023 7 14 each [name], NP [facility]  CLONAZEPAM  0.5 MG TABLET 01/20/2023 90 180 tablet [name], NP [facility]  CLONAZEPAM  0.5MG  TABLETS 01/20/2023 7 14 each [name], NP [facility].Aaron AasAaron Aas

## 2023-11-04 ENCOUNTER — Telehealth: Payer: Self-pay | Admitting: Neurology

## 2023-11-04 MED ORDER — CLONAZEPAM 0.5 MG PO TABS: 0.5000 mg | ORAL_TABLET | Freq: Two times a day (BID) | ORAL | 0 refills | Status: DC

## 2023-11-04 MED ORDER — CLONAZEPAM 0.5 MG PO TABS: 0.5000 mg | ORAL_TABLET | Freq: Two times a day (BID) | ORAL | 1 refills | Status: DC

## 2023-11-04 NOTE — Telephone Encounter (Signed)
 sent 1 week supply to [facility] in [place]

## 2023-11-04 NOTE — Telephone Encounter (Signed)
 [facility] with 1 refill.   Meds ordered this encounter  Medications   DISCONTD: clonazePAM  (KLONOPIN ) 0.5 MG tablet    Sig: Take 1 tablet (0.5 mg total) by mouth 2 (two) times daily.    Dispense:  14 tablet    Refill:  0    Fill this at local pharmacy, mail order is delayed   clonazePAM  (KLONOPIN ) 0.5 MG tablet    Sig: Take 1 tablet (0.5 mg total) by mouth 2 (two) times daily.    Dispense:  180 tablet    Refill:  1

## 2023-11-04 NOTE — Telephone Encounter (Signed)
 Phone rm please notify pt: [facility] with 1 refill.

## 2023-11-04 NOTE — Telephone Encounter (Signed)
 I also sent 1 week supply to [facility] in [place].   Meds ordered this encounter  Medications   clonazePAM  (KLONOPIN ) 0.5 MG tablet    Sig: Take 1 tablet (0.5 mg total) by mouth 2 (two) times daily.    Dispense:  14 tablet    Refill:  0    Fill at local pharmacy, waiting on mail order

## 2023-11-04 NOTE — Telephone Encounter (Signed)
 Pt's sister called stating that they have been waiting for the refill of the pt's clonazePAM  (KLONOPIN ) 0.5 MG tablet to be approved and now the pt only has enough till Sat. and [facility] takes up to 7 business days to deliver. Sister Avanell Bob would like to know if some can be called in to the [facility] on [address] until [facility] delivers to the pt. Please advise.

## 2023-11-06 DIAGNOSIS — D696 Thrombocytopenia, unspecified: Secondary | ICD-10-CM | POA: Diagnosis not present

## 2023-11-06 DIAGNOSIS — S42201D Unspecified fracture of upper end of right humerus, subsequent encounter for fracture with routine healing: Secondary | ICD-10-CM | POA: Diagnosis not present

## 2023-11-06 DIAGNOSIS — K5900 Constipation, unspecified: Secondary | ICD-10-CM | POA: Diagnosis not present

## 2023-11-06 DIAGNOSIS — I509 Heart failure, unspecified: Secondary | ICD-10-CM | POA: Diagnosis not present

## 2023-11-06 DIAGNOSIS — G40909 Epilepsy, unspecified, not intractable, without status epilepticus: Secondary | ICD-10-CM | POA: Diagnosis not present

## 2023-11-06 DIAGNOSIS — I110 Hypertensive heart disease with heart failure: Secondary | ICD-10-CM | POA: Diagnosis not present

## 2023-11-10 ENCOUNTER — Other Ambulatory Visit: Payer: Self-pay | Admitting: Neurology

## 2023-11-10 DIAGNOSIS — R569 Unspecified convulsions: Secondary | ICD-10-CM

## 2023-11-10 MED ORDER — LACOSAMIDE 200 MG PO TABS: 200.0000 mg | ORAL_TABLET | Freq: Two times a day (BID) | ORAL | 0 refills | Status: DC

## 2023-11-10 NOTE — Telephone Encounter (Signed)
 Requested Prescriptions   Pending Prescriptions Disp Refills   lacosamide  (VIMPAT ) 200 MG TABS tablet 180 tablet 1    Sig: Take 1 tablet (200 mg total) by mouth 2 (two) times daily.   Maria [name] can you send 7 day supply locally [facility] and [facility]?  Last seen 11/12/22, next appt 12/22/23   Dispenses   Dispensed Days Supply Quantity Provider Pharmacy  LACOSAMIDE  200 MG TABLET 07/22/2023 90 180 tablet [name], NP [facility]  LACOSAMIDE  200 MG TABLET 04/26/2023 90 180 tablet [name], NP [facility]  LACOSAMIDE  200 MG TABLET 01/21/2023 90 180 tablet [name], NP [facility]Aaron AasAaron Aas

## 2023-11-10 NOTE — Telephone Encounter (Signed)
 I sent Vimpat  to [facility]. According to drug registry, should have another # 180 tablets to refill at [facility]. See below. Thanks  Meds ordered this encounter  Medications   lacosamide  (VIMPAT ) 200 MG TABS tablet    Sig: Take 1 tablet (200 mg total) by mouth 2 (two) times daily.    Dispense:  14 tablet    Refill:  0

## 2023-11-10 NOTE — Telephone Encounter (Signed)
 Pt's sister has called for a refill of pt's lacosamide  (VIMPAT ) 200 MG TABS tablet, she is asking it be called into [facility].  Sister states it takes about 7 days so she is asking that 7 days worth be called into [facility] [phone]

## 2023-11-11 NOTE — Telephone Encounter (Signed)
 Phone room please notify pt: Vimpat  to [facility]. According to drug registry, should have another # 180 tablets to refill at [facility].

## 2023-11-13 DIAGNOSIS — Z87891 Personal history of nicotine dependence: Secondary | ICD-10-CM | POA: Diagnosis not present

## 2023-11-13 DIAGNOSIS — Z7982 Long term (current) use of aspirin: Secondary | ICD-10-CM | POA: Diagnosis not present

## 2023-11-13 DIAGNOSIS — Z791 Long term (current) use of non-steroidal anti-inflammatories (NSAID): Secondary | ICD-10-CM | POA: Diagnosis not present

## 2023-11-13 DIAGNOSIS — S42201D Unspecified fracture of upper end of right humerus, subsequent encounter for fracture with routine healing: Secondary | ICD-10-CM | POA: Diagnosis not present

## 2023-11-13 DIAGNOSIS — I051 Rheumatic mitral insufficiency: Secondary | ICD-10-CM | POA: Diagnosis not present

## 2023-11-13 DIAGNOSIS — I110 Hypertensive heart disease with heart failure: Secondary | ICD-10-CM | POA: Diagnosis not present

## 2023-11-13 DIAGNOSIS — R1310 Dysphagia, unspecified: Secondary | ICD-10-CM | POA: Diagnosis not present

## 2023-11-13 DIAGNOSIS — D649 Anemia, unspecified: Secondary | ICD-10-CM | POA: Diagnosis not present

## 2023-11-13 DIAGNOSIS — G40909 Epilepsy, unspecified, not intractable, without status epilepticus: Secondary | ICD-10-CM | POA: Diagnosis not present

## 2023-11-13 DIAGNOSIS — Z556 Problems related to health literacy: Secondary | ICD-10-CM | POA: Diagnosis not present

## 2023-11-13 DIAGNOSIS — E782 Mixed hyperlipidemia: Secondary | ICD-10-CM | POA: Diagnosis not present

## 2023-11-13 DIAGNOSIS — Z8701 Personal history of pneumonia (recurrent): Secondary | ICD-10-CM | POA: Diagnosis not present

## 2023-11-13 DIAGNOSIS — I509 Heart failure, unspecified: Secondary | ICD-10-CM | POA: Diagnosis not present

## 2023-11-13 DIAGNOSIS — K219 Gastro-esophageal reflux disease without esophagitis: Secondary | ICD-10-CM | POA: Diagnosis not present

## 2023-11-13 DIAGNOSIS — D696 Thrombocytopenia, unspecified: Secondary | ICD-10-CM | POA: Diagnosis not present

## 2023-11-13 DIAGNOSIS — K5900 Constipation, unspecified: Secondary | ICD-10-CM | POA: Diagnosis not present

## 2023-11-13 DIAGNOSIS — Z96611 Presence of right artificial shoulder joint: Secondary | ICD-10-CM | POA: Diagnosis not present

## 2023-11-13 DIAGNOSIS — F419 Anxiety disorder, unspecified: Secondary | ICD-10-CM | POA: Diagnosis not present

## 2023-11-13 DIAGNOSIS — Z9181 History of falling: Secondary | ICD-10-CM | POA: Diagnosis not present

## 2023-11-17 MED ORDER — DIVALPROEX SODIUM 500 MG PO DR TAB: 1000.0000 mg | DELAYED_RELEASE_TABLET | Freq: Every day | ORAL | 0 refills | Status: DC

## 2023-11-17 MED ORDER — LACOSAMIDE 200 MG PO TABS: 200.0000 mg | ORAL_TABLET | Freq: Two times a day (BID) | ORAL | 1 refills | Status: DC

## 2023-11-17 NOTE — Telephone Encounter (Addendum)
 Pt's sister, Lorie Griffin called [facility] was informed prescription for with 1 refill had been cancelled on May 31 and did not show reason for the cancelation. Please resend prescription. Ms. Maria [name] said have not picked up the 1 week supply that was sent [facility]. Would like a call back.

## 2023-11-17 NOTE — Telephone Encounter (Signed)
 I called patients sister. Needs new script for Vimpat  send to [facility] I sent this now, refilled Depakote . Last week 11/10/23 sent # 14 Vimpat  1 week supply to [facility] until mail order arrives.   Meds ordered this encounter  Medications   DISCONTD: lacosamide  (VIMPAT ) 200 MG TABS tablet    Sig: Take 1 tablet (200 mg total) by mouth 2 (two) times daily.    Dispense:  14 tablet    Refill:  0   lacosamide  (VIMPAT ) 200 MG TABS tablet    Sig: Take 1 tablet (200 mg total) by mouth 2 (two) times daily.    Dispense:  180 tablet    Refill:  1    Per drug registry 1 refill left, but patient's sister reporting no refill, they were cancelled. This will be new script. Call [phone] if any questions. Thanks   divalproex  (DEPAKOTE ) 500 MG DR tablet    Sig: Take 2 tablets (1,000 mg total) by mouth daily.    Dispense:  180 tablet    Refill:  0

## 2023-11-17 NOTE — Addendum Note (Signed)
 Addended by: [name] on: 11/17/2023 08:39 AM   Modules accepted: Orders

## 2023-11-19 DIAGNOSIS — I509 Heart failure, unspecified: Secondary | ICD-10-CM | POA: Diagnosis not present

## 2023-11-19 DIAGNOSIS — K5900 Constipation, unspecified: Secondary | ICD-10-CM | POA: Diagnosis not present

## 2023-11-19 DIAGNOSIS — I110 Hypertensive heart disease with heart failure: Secondary | ICD-10-CM | POA: Diagnosis not present

## 2023-11-19 DIAGNOSIS — S42201D Unspecified fracture of upper end of right humerus, subsequent encounter for fracture with routine healing: Secondary | ICD-10-CM | POA: Diagnosis not present

## 2023-11-19 DIAGNOSIS — G40909 Epilepsy, unspecified, not intractable, without status epilepticus: Secondary | ICD-10-CM | POA: Diagnosis not present

## 2023-11-19 DIAGNOSIS — D696 Thrombocytopenia, unspecified: Secondary | ICD-10-CM | POA: Diagnosis not present

## 2023-11-20 ENCOUNTER — Encounter (HOSPITAL_COMMUNITY): Payer: Self-pay

## 2023-11-20 ENCOUNTER — Emergency Department (HOSPITAL_COMMUNITY)
Admission: EM | Admit: 2023-11-20 | Discharge: 2023-11-21 | Disposition: A | Discharge status: Home / Self Care | Source: Home / Self Care | Attending: Emergency Medicine | Admitting: Emergency Medicine

## 2023-11-20 ENCOUNTER — Emergency Department (HOSPITAL_COMMUNITY)

## 2023-11-20 ENCOUNTER — Other Ambulatory Visit: Payer: Self-pay

## 2023-11-20 DIAGNOSIS — R509 Fever, unspecified: Secondary | ICD-10-CM | POA: Insufficient documentation

## 2023-11-20 DIAGNOSIS — E860 Dehydration: Secondary | ICD-10-CM | POA: Diagnosis not present

## 2023-11-20 DIAGNOSIS — R630 Anorexia: Secondary | ICD-10-CM | POA: Diagnosis not present

## 2023-11-20 DIAGNOSIS — E785 Hyperlipidemia, unspecified: Secondary | ICD-10-CM | POA: Diagnosis present

## 2023-11-20 DIAGNOSIS — Z888 Allergy status to other drugs, medicaments and biological substances status: Secondary | ICD-10-CM | POA: Diagnosis not present

## 2023-11-20 DIAGNOSIS — D72829 Elevated white blood cell count, unspecified: Secondary | ICD-10-CM | POA: Insufficient documentation

## 2023-11-20 DIAGNOSIS — R918 Other nonspecific abnormal finding of lung field: Secondary | ICD-10-CM | POA: Diagnosis not present

## 2023-11-20 DIAGNOSIS — R1310 Dysphagia, unspecified: Secondary | ICD-10-CM | POA: Diagnosis present

## 2023-11-20 DIAGNOSIS — G9389 Other specified disorders of brain: Secondary | ICD-10-CM | POA: Diagnosis not present

## 2023-11-20 DIAGNOSIS — Z87891 Personal history of nicotine dependence: Secondary | ICD-10-CM | POA: Diagnosis not present

## 2023-11-20 DIAGNOSIS — Z96611 Presence of right artificial shoulder joint: Secondary | ICD-10-CM | POA: Diagnosis present

## 2023-11-20 DIAGNOSIS — F79 Unspecified intellectual disabilities: Secondary | ICD-10-CM | POA: Diagnosis present

## 2023-11-20 DIAGNOSIS — K219 Gastro-esophageal reflux disease without esophagitis: Secondary | ICD-10-CM | POA: Diagnosis present

## 2023-11-20 DIAGNOSIS — R110 Nausea: Secondary | ICD-10-CM | POA: Diagnosis not present

## 2023-11-20 DIAGNOSIS — Z1152 Encounter for screening for COVID-19: Secondary | ICD-10-CM | POA: Diagnosis not present

## 2023-11-20 DIAGNOSIS — G40909 Epilepsy, unspecified, not intractable, without status epilepticus: Secondary | ICD-10-CM | POA: Diagnosis present

## 2023-11-20 DIAGNOSIS — R4182 Altered mental status, unspecified: Secondary | ICD-10-CM | POA: Diagnosis not present

## 2023-11-20 DIAGNOSIS — Z9842 Cataract extraction status, left eye: Secondary | ICD-10-CM | POA: Diagnosis not present

## 2023-11-20 DIAGNOSIS — Z7982 Long term (current) use of aspirin: Secondary | ICD-10-CM | POA: Diagnosis not present

## 2023-11-20 DIAGNOSIS — Z79899 Other long term (current) drug therapy: Secondary | ICD-10-CM | POA: Diagnosis not present

## 2023-11-20 DIAGNOSIS — Z8619 Personal history of other infectious and parasitic diseases: Secondary | ICD-10-CM | POA: Diagnosis not present

## 2023-11-20 DIAGNOSIS — G9341 Metabolic encephalopathy: Secondary | ICD-10-CM | POA: Diagnosis not present

## 2023-11-20 DIAGNOSIS — Z885 Allergy status to narcotic agent status: Secondary | ICD-10-CM | POA: Diagnosis not present

## 2023-11-20 DIAGNOSIS — J159 Unspecified bacterial pneumonia: Secondary | ICD-10-CM | POA: Diagnosis present

## 2023-11-20 DIAGNOSIS — J069 Acute upper respiratory infection, unspecified: Secondary | ICD-10-CM | POA: Diagnosis not present

## 2023-11-20 DIAGNOSIS — B9789 Other viral agents as the cause of diseases classified elsewhere: Secondary | ICD-10-CM | POA: Diagnosis not present

## 2023-11-20 DIAGNOSIS — Z833 Family history of diabetes mellitus: Secondary | ICD-10-CM | POA: Diagnosis not present

## 2023-11-20 DIAGNOSIS — Z90710 Acquired absence of both cervix and uterus: Secondary | ICD-10-CM | POA: Diagnosis not present

## 2023-11-20 DIAGNOSIS — I5032 Chronic diastolic (congestive) heart failure: Secondary | ICD-10-CM | POA: Diagnosis present

## 2023-11-20 DIAGNOSIS — R21 Rash and other nonspecific skin eruption: Secondary | ICD-10-CM | POA: Diagnosis present

## 2023-11-20 DIAGNOSIS — J168 Pneumonia due to other specified infectious organisms: Secondary | ICD-10-CM | POA: Diagnosis not present

## 2023-11-20 DIAGNOSIS — R233 Spontaneous ecchymoses: Secondary | ICD-10-CM | POA: Diagnosis present

## 2023-11-20 DIAGNOSIS — R059 Cough, unspecified: Secondary | ICD-10-CM | POA: Diagnosis not present

## 2023-11-20 DIAGNOSIS — Z9841 Cataract extraction status, right eye: Secondary | ICD-10-CM | POA: Diagnosis not present

## 2023-11-20 DIAGNOSIS — Z7901 Long term (current) use of anticoagulants: Secondary | ICD-10-CM | POA: Insufficient documentation

## 2023-11-20 DIAGNOSIS — J189 Pneumonia, unspecified organism: Secondary | ICD-10-CM | POA: Diagnosis not present

## 2023-11-20 DIAGNOSIS — Z83438 Family history of other disorder of lipoprotein metabolism and other lipidemia: Secondary | ICD-10-CM | POA: Diagnosis not present

## 2023-11-20 DIAGNOSIS — R531 Weakness: Secondary | ICD-10-CM | POA: Diagnosis not present

## 2023-11-20 LAB — CBC WITH DIFFERENTIAL/PLATELET
Abs Immature Granulocytes: 0.06 10*3/uL (ref 0.00–0.07)
Basophils Absolute: 0 10*3/uL (ref 0.0–0.1)
Basophils Relative: 0 %
Eosinophils Absolute: 0 10*3/uL (ref 0.0–0.5)
Eosinophils Relative: 0 %
HCT: 35.3 % — ABNORMAL LOW (ref 36.0–46.0)
Hemoglobin: 12.3 g/dL (ref 12.0–15.0)
Immature Granulocytes: 1 %
Lymphocytes Relative: 11 %
Lymphs Abs: 1.3 10*3/uL (ref 0.7–4.0)
MCH: 31.5 pg (ref 26.0–34.0)
MCHC: 34.8 g/dL (ref 30.0–36.0)
MCV: 90.5 fL (ref 80.0–100.0)
Monocytes Absolute: 1.1 10*3/uL — ABNORMAL HIGH (ref 0.1–1.0)
Monocytes Relative: 9 %
Neutro Abs: 9.8 10*3/uL — ABNORMAL HIGH (ref 1.7–7.7)
Neutrophils Relative %: 79 %
Platelets: 128 10*3/uL — ABNORMAL LOW (ref 150–400)
RBC: 3.9 MIL/uL (ref 3.87–5.11)
RDW: 14.4 % (ref 11.5–15.5)
WBC: 12.3 10*3/uL — ABNORMAL HIGH (ref 4.0–10.5)
nRBC: 0 % (ref 0.0–0.2)

## 2023-11-20 LAB — GROUP A STREP BY PCR: Group A Strep by PCR: NOT DETECTED

## 2023-11-20 MED ORDER — SODIUM CHLORIDE 0.9 % IV BOLUS
1000.0000 mL | Freq: Once | INTRAVENOUS | Status: AC
  Administered 2023-11-20: 1000 mL via INTRAVENOUS

## 2023-11-20 NOTE — ED Triage Notes (Signed)
 Pt reports onset of sore throat last night and had a fever today of 101.

## 2023-11-21 ENCOUNTER — Observation Stay (HOSPITAL_COMMUNITY)

## 2023-11-21 ENCOUNTER — Emergency Department (HOSPITAL_COMMUNITY)

## 2023-11-21 ENCOUNTER — Encounter (HOSPITAL_COMMUNITY): Payer: Self-pay

## 2023-11-21 ENCOUNTER — Inpatient Hospital Stay (HOSPITAL_COMMUNITY)
Admission: EM | Admit: 2023-11-21 | Discharge: 2023-11-24 | DRG: 193 | Disposition: A | Discharge status: Home Health | Attending: Family Medicine | Admitting: Family Medicine

## 2023-11-21 DIAGNOSIS — R1310 Dysphagia, unspecified: Secondary | ICD-10-CM | POA: Diagnosis present

## 2023-11-21 DIAGNOSIS — Z96611 Presence of right artificial shoulder joint: Secondary | ICD-10-CM | POA: Diagnosis not present

## 2023-11-21 DIAGNOSIS — Z1152 Encounter for screening for COVID-19: Secondary | ICD-10-CM

## 2023-11-21 DIAGNOSIS — R918 Other nonspecific abnormal finding of lung field: Secondary | ICD-10-CM | POA: Diagnosis not present

## 2023-11-21 DIAGNOSIS — Z9841 Cataract extraction status, right eye: Secondary | ICD-10-CM

## 2023-11-21 DIAGNOSIS — Z888 Allergy status to other drugs, medicaments and biological substances status: Secondary | ICD-10-CM

## 2023-11-21 DIAGNOSIS — Z90710 Acquired absence of both cervix and uterus: Secondary | ICD-10-CM

## 2023-11-21 DIAGNOSIS — R21 Rash and other nonspecific skin eruption: Secondary | ICD-10-CM | POA: Diagnosis present

## 2023-11-21 DIAGNOSIS — Z79899 Other long term (current) drug therapy: Secondary | ICD-10-CM

## 2023-11-21 DIAGNOSIS — R233 Spontaneous ecchymoses: Secondary | ICD-10-CM | POA: Diagnosis present

## 2023-11-21 DIAGNOSIS — I5032 Chronic diastolic (congestive) heart failure: Secondary | ICD-10-CM | POA: Diagnosis present

## 2023-11-21 DIAGNOSIS — G9341 Metabolic encephalopathy: Secondary | ICD-10-CM | POA: Diagnosis present

## 2023-11-21 DIAGNOSIS — R059 Cough, unspecified: Secondary | ICD-10-CM | POA: Diagnosis not present

## 2023-11-21 DIAGNOSIS — Z885 Allergy status to narcotic agent status: Secondary | ICD-10-CM

## 2023-11-21 DIAGNOSIS — E860 Dehydration: Secondary | ICD-10-CM | POA: Diagnosis not present

## 2023-11-21 DIAGNOSIS — Z8619 Personal history of other infectious and parasitic diseases: Secondary | ICD-10-CM

## 2023-11-21 DIAGNOSIS — J189 Pneumonia, unspecified organism: Secondary | ICD-10-CM

## 2023-11-21 DIAGNOSIS — G40909 Epilepsy, unspecified, not intractable, without status epilepticus: Secondary | ICD-10-CM | POA: Diagnosis present

## 2023-11-21 DIAGNOSIS — Z83438 Family history of other disorder of lipoprotein metabolism and other lipidemia: Secondary | ICD-10-CM

## 2023-11-21 DIAGNOSIS — K219 Gastro-esophageal reflux disease without esophagitis: Secondary | ICD-10-CM | POA: Diagnosis present

## 2023-11-21 DIAGNOSIS — J069 Acute upper respiratory infection, unspecified: Secondary | ICD-10-CM | POA: Diagnosis present

## 2023-11-21 DIAGNOSIS — Z833 Family history of diabetes mellitus: Secondary | ICD-10-CM

## 2023-11-21 DIAGNOSIS — Z87891 Personal history of nicotine dependence: Secondary | ICD-10-CM

## 2023-11-21 DIAGNOSIS — Z9842 Cataract extraction status, left eye: Secondary | ICD-10-CM

## 2023-11-21 DIAGNOSIS — F79 Unspecified intellectual disabilities: Secondary | ICD-10-CM | POA: Diagnosis present

## 2023-11-21 DIAGNOSIS — R531 Weakness: Secondary | ICD-10-CM | POA: Diagnosis not present

## 2023-11-21 DIAGNOSIS — J159 Unspecified bacterial pneumonia: Principal | ICD-10-CM | POA: Diagnosis present

## 2023-11-21 DIAGNOSIS — E785 Hyperlipidemia, unspecified: Secondary | ICD-10-CM | POA: Diagnosis present

## 2023-11-21 DIAGNOSIS — Z7982 Long term (current) use of aspirin: Secondary | ICD-10-CM

## 2023-11-21 HISTORY — DX: Pneumonia, unspecified organism: J18.9

## 2023-11-21 LAB — COMPREHENSIVE METABOLIC PANEL WITH GFR
ALT: 18 U/L (ref 0–44)
AST: 18 U/L (ref 15–41)
Albumin: 3.8 g/dL (ref 3.5–5.0)
Alkaline Phosphatase: 76 U/L (ref 38–126)
Anion gap: 11 (ref 5–15)
BUN: 18 mg/dL (ref 8–23)
CO2: 23 mmol/L (ref 22–32)
Calcium: 9.5 mg/dL (ref 8.9–10.3)
Chloride: 105 mmol/L (ref 98–111)
Creatinine, Ser: 0.84 mg/dL (ref 0.44–1.00)
GFR, Estimated: >60 mL/min (ref >60)
Glucose, Bld: 105 mg/dL — ABNORMAL HIGH (ref 70–99)
Potassium: 4.3 mmol/L (ref 3.5–5.1)
Sodium: 139 mmol/L (ref 135–145)
Total Bilirubin: 0.8 mg/dL (ref 0.0–1.2)
Total Protein: 6.6 g/dL (ref 6.5–8.1)

## 2023-11-21 LAB — CBC
HCT: 38 % (ref 36.0–46.0)
Hemoglobin: 12.7 g/dL (ref 12.0–15.0)
MCH: 30.8 pg (ref 26.0–34.0)
MCHC: 33.4 g/dL (ref 30.0–36.0)
MCV: 92 fL (ref 80.0–100.0)
Platelets: 134 10*3/uL — ABNORMAL LOW (ref 150–400)
RBC: 4.13 MIL/uL (ref 3.87–5.11)
RDW: 14.8 % (ref 11.5–15.5)
WBC: 9.6 10*3/uL (ref 4.0–10.5)
nRBC: 0 % (ref 0.0–0.2)

## 2023-11-21 LAB — BASIC METABOLIC PANEL WITH GFR
Anion gap: 13 (ref 5–15)
BUN: 17 mg/dL (ref 8–23)
CO2: 23 mmol/L (ref 22–32)
Calcium: 9.5 mg/dL (ref 8.9–10.3)
Chloride: 103 mmol/L (ref 98–111)
Creatinine, Ser: 1.03 mg/dL — ABNORMAL HIGH (ref 0.44–1.00)
GFR, Estimated: >60 mL/min (ref >60)
Glucose, Bld: 123 mg/dL — ABNORMAL HIGH (ref 70–99)
Potassium: 4 mmol/L (ref 3.5–5.1)
Sodium: 139 mmol/L (ref 135–145)

## 2023-11-21 LAB — RESP PANEL BY RT-PCR (RSV, FLU A&B, COVID)  RVPGX2
Influenza A by PCR: NEGATIVE
Influenza B by PCR: NEGATIVE
Resp Syncytial Virus by PCR: NEGATIVE
SARS Coronavirus 2 by RT PCR: NEGATIVE

## 2023-11-21 MED ORDER — DIVALPROEX SODIUM 250 MG PO DR TAB
1000.0000 mg | DELAYED_RELEASE_TABLET | Freq: Every day | ORAL | Status: DC
  Administered 2023-11-22 – 2023-11-24 (×3): 1000 mg via ORAL
  Filled 2023-11-21 (×5): qty 4

## 2023-11-21 MED ORDER — POLYETHYLENE GLYCOL 3350 17 G PO PACK
17.0000 g | PACK | Freq: Every day | ORAL | Status: DC | PRN
  Administered 2023-11-23: 17 g via ORAL

## 2023-11-21 MED ORDER — SODIUM CHLORIDE 0.9 % IV SOLN
3.0000 g | Freq: Four times a day (QID) | INTRAVENOUS | Status: DC
  Administered 2023-11-22 – 2023-11-24 (×7): 3 g via INTRAVENOUS
  Filled 2023-11-21 (×7): qty 8

## 2023-11-21 MED ORDER — CLONAZEPAM 0.5 MG PO TABS
0.5000 mg | ORAL_TABLET | Freq: Two times a day (BID) | ORAL | Status: DC
  Administered 2023-11-22 – 2023-11-24 (×5): 0.5 mg via ORAL
  Filled 2023-11-21 (×5): qty 1

## 2023-11-21 MED ORDER — SODIUM CHLORIDE 0.9% FLUSH
3.0000 mL | Freq: Two times a day (BID) | INTRAVENOUS | Status: DC
  Administered 2023-11-22 – 2023-11-24 (×4): 3 mL via INTRAVENOUS

## 2023-11-21 MED ORDER — SODIUM CHLORIDE 0.9 % IV SOLN: 1.0000 g | INTRAVENOUS | Status: DC

## 2023-11-21 MED ORDER — PANTOPRAZOLE SODIUM 40 MG PO TBEC
40.0000 mg | DELAYED_RELEASE_TABLET | Freq: Every day | ORAL | Status: DC
  Administered 2023-11-22 – 2023-11-24 (×3): 40 mg via ORAL
  Filled 2023-11-21 (×3): qty 1

## 2023-11-21 MED ORDER — ENOXAPARIN SODIUM 40 MG/0.4ML IJ SOSY
40.0000 mg | PREFILLED_SYRINGE | INTRAMUSCULAR | Status: DC
  Administered 2023-11-22 – 2023-11-24 (×3): 40 mg via SUBCUTANEOUS
  Filled 2023-11-21 (×3): qty 0.4

## 2023-11-21 MED ORDER — SODIUM CHLORIDE 0.9 % IV BOLUS
1000.0000 mL | Freq: Once | INTRAVENOUS | Status: AC
  Administered 2023-11-21: 1000 mL via INTRAVENOUS

## 2023-11-21 MED ORDER — LACOSAMIDE 50 MG PO TABS
200.0000 mg | ORAL_TABLET | Freq: Two times a day (BID) | ORAL | Status: DC
  Administered 2023-11-22 – 2023-11-24 (×5): 200 mg via ORAL
  Filled 2023-11-21 (×5): qty 4

## 2023-11-21 MED ORDER — ONDANSETRON HCL 4 MG/2ML IJ SOLN: 4.0000 mg | Freq: Four times a day (QID) | INTRAMUSCULAR | Status: DC | PRN

## 2023-11-21 MED ORDER — AZITHROMYCIN 250 MG PO TABS
500.0000 mg | ORAL_TABLET | Freq: Every day | ORAL | Status: AC
  Administered 2023-11-22 – 2023-11-23 (×2): 500 mg via ORAL
  Filled 2023-11-21 (×2): qty 2

## 2023-11-21 MED ORDER — MELATONIN 3 MG PO TABS
6.0000 mg | ORAL_TABLET | Freq: Every evening | ORAL | Status: DC | PRN
  Administered 2023-11-23: 6 mg via ORAL
  Filled 2023-11-21: qty 2

## 2023-11-21 MED ORDER — BENZONATATE 100 MG PO CAPS: 100.0000 mg | ORAL_CAPSULE | Freq: Three times a day (TID) | ORAL | 0 refills | Status: DC

## 2023-11-21 MED ORDER — ATORVASTATIN CALCIUM 20 MG PO TABS
20.0000 mg | ORAL_TABLET | Freq: Every day | ORAL | Status: DC
  Administered 2023-11-22 – 2023-11-23 (×2): 20 mg via ORAL
  Filled 2023-11-21 (×2): qty 2

## 2023-11-21 MED ORDER — ALBUTEROL SULFATE (2.5 MG/3ML) 0.083% IN NEBU: 2.5000 mg | INHALATION_SOLUTION | RESPIRATORY_TRACT | Status: DC | PRN

## 2023-11-21 MED ORDER — ACETAMINOPHEN 500 MG PO TABS
1000.0000 mg | ORAL_TABLET | Freq: Four times a day (QID) | ORAL | Status: DC | PRN
  Administered 2023-11-22: 1000 mg via ORAL
  Filled 2023-11-21: qty 2

## 2023-11-21 MED ORDER — SODIUM CHLORIDE 0.9 % IV SOLN
2.0000 g | Freq: Once | INTRAVENOUS | Status: AC
  Administered 2023-11-21: 2 g via INTRAVENOUS
  Filled 2023-11-21: qty 20

## 2023-11-21 MED ORDER — SODIUM CHLORIDE 0.9 % IV SOLN
500.0000 mg | Freq: Once | INTRAVENOUS | Status: AC
  Administered 2023-11-21: 500 mg via INTRAVENOUS
  Filled 2023-11-21: qty 5

## 2023-11-21 MED ORDER — ACETAMINOPHEN 325 MG PO TABS
650.0000 mg | ORAL_TABLET | Freq: Once | ORAL | Status: AC
  Administered 2023-11-21: 650 mg via ORAL
  Filled 2023-11-21: qty 2

## 2023-11-21 NOTE — ED Provider Notes (Signed)
 [place] EMERGENCY DEPARTMENT AT [facility] Provider Note   CSN: [id number] Arrival date & time: 11/21/23  1914     Patient presents with: Illness   Maria [name] is a 63 y.o. female.    Illness Associated symptoms: cough and fever        Maria [name] is a 63 y.o. female with past medical history of intellectual disability, seizures, who presents to the Emergency Department accompanied by her sister who provides most of the history.  Patient was seen here yesterday for sore throat cough with fever.  Diagnosed with viral illness.  Per patient's sister, she continues to have worsening cough, fever and is not eating or drinking.  Sister noticed that she had some loose stools today as well.  Cough has been productive of thick yellow to green sputum.  She had negative strep COVID and flu testing yesterday.  She recently attended a funeral but no known sick contacts  Prior to Admission medications   Medication Sig Start Date End Date Taking? Authorizing Provider  aspirin  EC 81 MG tablet Take 81 mg by mouth daily. Swallow whole.    [provider]  atorvastatin  (LIPITOR) 20 MG tablet Take 20 mg by mouth at bedtime. 08/24/18   [provider]  benzonatate  (TESSALON ) 100 MG capsule Take 1 capsule (100 mg total) by mouth every 8 (eight) hours. 11/21/23   Emil Share, DO  Calcium  Carb-Cholecalciferol (CALCIUM /VITAMIN D  PO) Take 1 tablet by mouth 2 (two) times daily. 600 mg / 800 units    [provider]  calcium  carbonate (TUMS - DOSED IN MG ELEMENTAL CALCIUM ) 500 MG chewable tablet Chew 1 tablet by mouth daily as needed for indigestion or heartburn.    [provider]  cholecalciferol (VITAMIN D3) 25 MCG (1000 UNIT) tablet Take 1,000 Units by mouth 2 (two) times daily.    [provider]  clonazePAM  (KLONOPIN ) 0.5 MG tablet Take 1 tablet (0.5 mg total) by mouth 2 (two) times daily. 11/04/23   Gayland Lauraine PARAS, NP  divalproex  (DEPAKOTE ) 500  MG DR tablet Take 2 tablets (1,000 mg total) by mouth daily. 11/17/23 11/16/24  Gayland Lauraine PARAS, NP  HYDROcodone -acetaminophen  (NORCO) 5-325 MG tablet Take 1-2 tablets by mouth every 6 (six) hours as needed for severe pain (pain score 7-10). 06/12/23   McBane, Caroline N, PA-C  lacosamide  (VIMPAT ) 200 MG TABS tablet Take 1 tablet (200 mg total) by mouth 2 (two) times daily. 11/17/23   Gayland Lauraine PARAS, NP  Multiple Vitamin (MULTIVITAMIN) capsule Take 1 capsule by mouth daily.    [provider]  omeprazole  (PRILOSEC) 40 MG capsule TAKE 1 CAPSULE DAILY (NEED OFFICE VISIT) 10/30/23   Eartha Flavors, Toribio, MD  ondansetron  (ZOFRAN -ODT) 4 MG disintegrating tablet Take 1 tablet (4 mg total) by mouth every 8 (eight) hours as needed for nausea or vomiting. 06/16/23   Small, Brooke L, PA  senna (SENOKOT) 8.6 MG tablet Take 2 tablets by mouth daily.    [provider]  Vibegron  (GEMTESA ) 75 MG TABS Take 1 tablet (75 mg total) by mouth daily. 08/05/23   Matilda Senior, MD  QUEtiapine  (SEROQUEL ) 25 MG tablet Take 1 tablet (25 mg total) by mouth 2 (two) times daily. 05/31/20 06/06/20  Angiulli, Toribio PARAS, PA-C    Allergies: Codeine and Lamictal [lamotrigine]    Review of Systems  Constitutional:  Positive for appetite change and fever. Negative for chills.  Respiratory:  Positive for cough.  Updated Vital Signs BP 112/62   Pulse (!) 103   Temp (!) 101.2 F (38.4 C) (Axillary)   Resp 20   Ht 5' 6 (1.676 m)   Wt 60.8 kg   SpO2 91%   BMI 21.63 kg/m   Physical Exam Vitals and nursing note reviewed.  Constitutional:      Appearance: Normal appearance. She is ill-appearing.  HENT:     Right Ear: Tympanic membrane and ear canal normal.     Left Ear: Tympanic membrane and ear canal normal.     Mouth/Throat:     Mouth: Mucous membranes are dry.   Cardiovascular:     Rate and Rhythm: Regular rhythm. Tachycardia present.  Pulmonary:     Effort: Pulmonary effort is normal.      Breath sounds: Rhonchi present.     Comments: Patient on 2 L O2 by nasal cannula on my exam.  Coarse rhonchorous lung sounds bilaterally Abdominal:     Palpations: Abdomen is soft.     Tenderness: There is no abdominal tenderness.   Musculoskeletal:     Right lower leg: No edema.     Left lower leg: No edema.  Lymphadenopathy:     Cervical: No cervical adenopathy.   Skin:    General: Skin is warm.     Capillary Refill: Capillary refill takes less than 2 seconds.   Neurological:     General: No focal deficit present.     Mental Status: She is alert.     Sensory: No sensory deficit.     Motor: No weakness.     (all labs ordered are listed, but only abnormal results are displayed) Labs Reviewed  CBC - Abnormal; Notable for the following components:      Result Value   Platelets 134 (*)    All other components within normal limits  BASIC METABOLIC PANEL WITH GFR - Abnormal; Notable for the following components:   Glucose, Bld 123 (*)    Creatinine, Ser 1.03 (*)    All other components within normal limits    EKG: None  Radiology: [facility] Chest Port 1 View Result Date: 11/21/2023 CLINICAL DATA:  Cough and weakness EXAM: PORTABLE CHEST 1 VIEW COMPARISON:  11/20/2023 FINDINGS: Right shoulder replacement. Patchy bibasilar airspace opacities. Stable cardiomediastinal silhouette with aortic atherosclerosis. No pleural effusion or pneumothorax IMPRESSION: Patchy bibasilar airspace opacities, possible pneumonia. Electronically Signed   By: Luke Bun M.D.   On: 11/21/2023 22:26   DG Chest Port 1 View Result Date: 11/20/2023 CLINICAL DATA:  Cough with fever, nausea and loss of appetite. EXAM: PORTABLE CHEST 1 VIEW COMPARISON:  June 16, 2023 FINDINGS: The heart size and mediastinal contours are within normal limits. There is no evidence of acute infiltrate, pleural effusion or pneumothorax. Evidence of prior right shoulder arthroplasty is noted. Multilevel degenerative changes seen  throughout the thoracic spine. IMPRESSION: No active cardiopulmonary disease. Electronically Signed   By: Suzen Dials M.D.   On: 11/20/2023 23:50     Procedures   Medications Ordered in the ED  azithromycin  (ZITHROMAX ) 500 mg in sodium chloride  0.9 % 250 mL IVPB (500 mg Intravenous New Bag/Given 11/21/23 2145)  acetaminophen  (TYLENOL ) tablet 650 mg (650 mg Oral Given 11/21/23 2146)  sodium chloride  0.9 % bolus 1,000 mL (1,000 mLs Intravenous New Bag/Given 11/21/23 2142)  cefTRIAXone  (ROCEPHIN ) 2 g in sodium chloride  0.9 % 100 mL IVPB (0 g Intravenous Stopped 11/21/23 2213)  Medical Decision Making Patient seen here yesterday for URI type symptoms.  Has continued to have fever at home with decreased p.o. intake diarrhea and lethargy.  Most of history provided by patient's sister who is at bedside  Patient had productive cough with thick green sputum on my exam.  O2 sats at 91% on room air on arrival and patient febrile at 101.2.  Mucous membranes are dry  I have reviewed chest x-ray results along with COVID flu and RSV testing from yesterday.  Chest x-ray without evidence of pneumonia, but clinically I suspect her symptoms are related to pneumonia.  PE, ACS also considered but felt less likely.  Continued viral process also considered  Amount and/or Complexity of Data Reviewed Labs: ordered.    Details: Labs no evidence of leukocytosis, chemistries without significant derangement. Radiology: ordered.    Details: Repeat chest x-ray today shows patchy bibasilar opacities Discussion of management or test interpretation with external provider(s): Patient was placed on 2 L O2 upon arrival after having O2 sat on room air at 91%.  Febrile at 101.2 and Tylenol  was given along with Rocephin  and Zithromax .  Patient given IV fluids.  Will consult with hospitalist for admission  Discussed with [facility] hospitalist, Dr. Segars who agrees to admit  Risk OTC  drugs.        Final diagnoses:  Pneumonia of both lungs due to infectious organism, unspecified part of lung    ED Discharge Orders     None          Herlinda Milling, PA-C 11/21/23 2313    Suzette Pac, MD 11/23/23 1345

## 2023-11-21 NOTE — ED Notes (Signed)
 Pt's sister, Avanell Bob, arrived to help contribute to HPI. She is concerned that pt is dehydrated and has not drank any fluids today. She had to go to work and no one was with pt to ensure fluid intake.

## 2023-11-21 NOTE — ED Provider Notes (Signed)
 [place] EMERGENCY DEPARTMENT AT [facility] Provider Note   CSN: [id number] Arrival date & time: 11/20/23  2137     Patient presents with: Sore Throat   Maria [name] is a 63 y.o. female.   63 yo F with a significant past medical history of intellectual disability comes in with a chief complaint of a fever.  Family recently went to a funeral and think maybe got sick from someone there.  Coughing congested fevers as high as 101 axillary.   Sore Throat       Prior to Admission medications   Medication Sig Start Date End Date Taking? Authorizing Provider  benzonatate  (TESSALON ) 100 MG capsule Take 1 capsule (100 mg total) by mouth every 8 (eight) hours. 11/21/23  Yes Albertus Hughs, DO  aspirin  EC 81 MG tablet Take 81 mg by mouth daily. Swallow whole.    [provider]  atorvastatin  (LIPITOR) 20 MG tablet Take 20 mg by mouth at bedtime. 08/24/18   [provider]  Calcium  Carb-Cholecalciferol (CALCIUM /VITAMIN D  PO) Take 1 tablet by mouth 2 (two) times daily. 600 mg / 800 units    [provider]  calcium  carbonate (TUMS - DOSED IN MG ELEMENTAL CALCIUM ) 500 MG chewable tablet Chew 1 tablet by mouth daily as needed for indigestion or heartburn.    [provider]  cholecalciferol (VITAMIN D3) 25 MCG (1000 UNIT) tablet Take 1,000 Units by mouth 2 (two) times daily.    [provider]  clonazePAM  (KLONOPIN ) 0.5 MG tablet Take 1 tablet (0.5 mg total) by mouth 2 (two) times daily. 11/04/23   [name], NP  divalproex  (DEPAKOTE ) 500 MG DR tablet Take 2 tablets (1,000 mg total) by mouth daily. 11/17/23 11/16/24  [name], NP  HYDROcodone -acetaminophen  (NORCO) 5-325 MG tablet Take 1-2 tablets by mouth every 6 (six) hours as needed for severe pain (pain score 7-10). 06/12/23   McBane, Caroline N, PA-C  lacosamide  (VIMPAT ) 200 MG TABS tablet Take 1 tablet (200 mg total) by mouth 2 (two) times daily. 11/17/23   [name], NP   Multiple Vitamin (MULTIVITAMIN) capsule Take 1 capsule by mouth daily.    [provider]  omeprazole  (PRILOSEC) 40 MG capsule TAKE 1 CAPSULE DAILY (NEED OFFICE VISIT) 10/30/23   Umberto Ganong, Bearl Limes, MD  ondansetron  (ZOFRAN -ODT) 4 MG disintegrating tablet Take 1 tablet (4 mg total) by mouth every 8 (eight) hours as needed for nausea or vomiting. 06/16/23   Small, Brooke L, PA  senna (SENOKOT) 8.6 MG tablet Take 2 tablets by mouth daily.    [provider]  Vibegron  (GEMTESA ) 75 MG TABS Take 1 tablet (75 mg total) by mouth daily. 08/05/23   Trent Frizzle, MD  QUEtiapine  (SEROQUEL ) 25 MG tablet Take 1 tablet (25 mg total) by mouth 2 (two) times daily. 05/31/20 06/06/20  Angiulli, Everlyn Hockey, PA-C    Allergies: Codeine and Lamictal [lamotrigine]    Review of Systems  Updated Vital Signs BP 116/64   Pulse 99   Temp 98.5 F (36.9 C) (Oral)   Resp 16   Wt 60.8 kg   SpO2 92%   BMI 21.63 kg/m   Physical Exam Vitals and nursing note reviewed.  Constitutional:      General: She is not in acute distress.    Appearance: She is well-developed. She is not diaphoretic.     Comments: Appears much older than stated age.  HENT:     Head: Normocephalic and atraumatic.  Eyes:     Pupils: Pupils are equal, round, and reactive to light.    Cardiovascular:     Rate and Rhythm: Normal rate and regular rhythm.     Heart sounds: No murmur heard.    No friction rub. No gallop.  Pulmonary:     Effort: Pulmonary effort is normal.     Breath sounds: No wheezing or rales.  Abdominal:     General: There is no distension.     Palpations: Abdomen is soft.     Tenderness: There is no abdominal tenderness.   Musculoskeletal:        General: No tenderness.     Cervical back: Normal range of motion and neck supple.   Skin:    General: Skin is warm and dry.   Neurological:     Mental Status: She is alert and oriented to person, place, and time.   Psychiatric:         Behavior: Behavior normal.     (all labs ordered are listed, but only abnormal results are displayed) Labs Reviewed  CBC WITH DIFFERENTIAL/PLATELET - Abnormal; Notable for the following components:      Result Value   WBC 12.3 (*)    HCT 35.3 (*)    Platelets 128 (*)    Neutro Abs 9.8 (*)    Monocytes Absolute 1.1 (*)    All other components within normal limits  COMPREHENSIVE METABOLIC PANEL WITH GFR - Abnormal; Notable for the following components:   Glucose, Bld 105 (*)    All other components within normal limits  GROUP A STREP BY PCR  RESP PANEL BY RT-PCR (RSV, FLU A&B, COVID)  [id number]    EKG: None  Radiology: [facility] Chest Port 1 View Result Date: 11/20/2023 CLINICAL DATA:  Cough with fever, nausea and loss of appetite. EXAM: PORTABLE CHEST 1 VIEW COMPARISON:  June 16, 2023 FINDINGS: The heart size and mediastinal contours are within normal limits. There is no evidence of acute infiltrate, pleural effusion or pneumothorax. Evidence of prior right shoulder arthroplasty is noted. Multilevel degenerative changes seen throughout the thoracic spine. IMPRESSION: No active cardiopulmonary disease. Electronically Signed   By: Virgle Grime M.D.   On: 11/20/2023 23:50     Procedures   Medications Ordered in the ED  sodium chloride  0.9 % bolus 1,000 mL (0 mLs Intravenous Stopped 11/21/23 0044)                                    Medical Decision Making Amount and/or Complexity of Data Reviewed Labs: ordered. Radiology: ordered.  Risk Prescription drug management.   63 yo F with a chief complaints of cough congestion fever.  Going on for couple days.  Patient looks chronically ill.  Will obtain lab work.  CXR.    Chest x-ray independently interpreted by me without focal infiltrate.  Rapid strep test negative.  Awaiting blood work.  COVID and RSV are negative.  Mild leukocytosis with a neutrophilic predominance.  No significant electrolyte abnormalities.  Renal  function appears to be at baseline.  Discussed results with patient and family.  Will discharge at this time.  Treat as possible viral syndrome.  PCP follow-up.  1:03 AM:  I have discussed the diagnosis/risks/treatment options with the patient and family.  Evaluation and diagnostic testing in the emergency department does not suggest an emergent condition requiring admission or immediate intervention beyond what has been  performed at this time.  They will follow up with PCP. We also discussed returning to the ED immediately if new or worsening sx occur. We discussed the sx which are most concerning (e.g., sudden worsening pain, fever, inability to tolerate by mouth) that necessitate immediate return. Medications administered to the patient during their visit and any new prescriptions provided to the patient are listed below.  Medications given during this visit Medications  sodium chloride  0.9 % bolus 1,000 mL (0 mLs Intravenous Stopped 11/21/23 0044)     The patient appears reasonably screen and/or stabilized for discharge and I doubt any other medical condition or other [facility] requiring further screening, evaluation, or treatment in the ED at this time prior to discharge.       Final diagnoses:  Viral URI with cough    ED Discharge Orders          Ordered    benzonatate  (TESSALON ) 100 MG capsule  Every 8 hours        11/21/23 0037               Albertus Hughs, DO 11/21/23 0103

## 2023-11-21 NOTE — ED Notes (Signed)
 Blood cultures drawn at 2159 from [facility] & at 2140 from right forearm

## 2023-11-21 NOTE — ED Notes (Addendum)
 Pt is lethargic upon assessment, cough present that sounds wet but pt denies being able to produce sputum. Pt's oxygen  saturation 91% on RA, placed on 2L [place] increasing oxygen  saturation to 95% EDP made aware of pt's condition & temperature.

## 2023-11-21 NOTE — ED Notes (Signed)
 Pt coughed & spat large yellow sputum. Pt's oxygen  saturation increased after to 99% on 2L 

## 2023-11-21 NOTE — ED Triage Notes (Signed)
 Pt BIB [facility]. They report pt has had a fever and is not been feeling well. They were unable to provide any more information. Pt is a poor historian and has a legal guardian on file.   Per chart review pt was seen in the ED yesterday for sore throat, URI symptoms, and fever. Pt was discharges with a dx of viral illness.   Will attempt to contact pt's caregiver to gain more information.

## 2023-11-21 NOTE — Discharge Instructions (Signed)
 Take tylenol 2 pills 4 times a day and motrin 4 pills 3 times a day.  Drink plenty of fluids.  Return for worsening shortness of breath, headache, confusion. Follow up with your family doctor.

## 2023-11-21 NOTE — ED Notes (Signed)
 Pt more responsive to verbal stimuli, answering questions & interacting with family at bedside.

## 2023-11-21 NOTE — ED Notes (Signed)
 Pt placed on 2L [place]

## 2023-11-21 NOTE — ED Notes (Signed)
 Patient transported to CT

## 2023-11-22 DIAGNOSIS — Z9842 Cataract extraction status, left eye: Secondary | ICD-10-CM | POA: Diagnosis not present

## 2023-11-22 DIAGNOSIS — Z8619 Personal history of other infectious and parasitic diseases: Secondary | ICD-10-CM | POA: Diagnosis not present

## 2023-11-22 DIAGNOSIS — Z7982 Long term (current) use of aspirin: Secondary | ICD-10-CM | POA: Diagnosis not present

## 2023-11-22 DIAGNOSIS — E785 Hyperlipidemia, unspecified: Secondary | ICD-10-CM | POA: Diagnosis present

## 2023-11-22 DIAGNOSIS — G9389 Other specified disorders of brain: Secondary | ICD-10-CM | POA: Diagnosis not present

## 2023-11-22 DIAGNOSIS — R21 Rash and other nonspecific skin eruption: Secondary | ICD-10-CM | POA: Diagnosis present

## 2023-11-22 DIAGNOSIS — K219 Gastro-esophageal reflux disease without esophagitis: Secondary | ICD-10-CM | POA: Diagnosis present

## 2023-11-22 DIAGNOSIS — Z87891 Personal history of nicotine dependence: Secondary | ICD-10-CM | POA: Diagnosis not present

## 2023-11-22 DIAGNOSIS — Z833 Family history of diabetes mellitus: Secondary | ICD-10-CM | POA: Diagnosis not present

## 2023-11-22 DIAGNOSIS — Z79899 Other long term (current) drug therapy: Secondary | ICD-10-CM | POA: Diagnosis not present

## 2023-11-22 DIAGNOSIS — Z96611 Presence of right artificial shoulder joint: Secondary | ICD-10-CM | POA: Diagnosis present

## 2023-11-22 DIAGNOSIS — I5032 Chronic diastolic (congestive) heart failure: Secondary | ICD-10-CM | POA: Diagnosis present

## 2023-11-22 DIAGNOSIS — Z83438 Family history of other disorder of lipoprotein metabolism and other lipidemia: Secondary | ICD-10-CM | POA: Diagnosis not present

## 2023-11-22 DIAGNOSIS — J069 Acute upper respiratory infection, unspecified: Secondary | ICD-10-CM | POA: Diagnosis present

## 2023-11-22 DIAGNOSIS — R233 Spontaneous ecchymoses: Secondary | ICD-10-CM | POA: Diagnosis present

## 2023-11-22 DIAGNOSIS — G40909 Epilepsy, unspecified, not intractable, without status epilepticus: Secondary | ICD-10-CM | POA: Diagnosis present

## 2023-11-22 DIAGNOSIS — R4182 Altered mental status, unspecified: Secondary | ICD-10-CM | POA: Diagnosis not present

## 2023-11-22 DIAGNOSIS — J189 Pneumonia, unspecified organism: Secondary | ICD-10-CM

## 2023-11-22 DIAGNOSIS — G9341 Metabolic encephalopathy: Secondary | ICD-10-CM

## 2023-11-22 DIAGNOSIS — Z9841 Cataract extraction status, right eye: Secondary | ICD-10-CM | POA: Diagnosis not present

## 2023-11-22 DIAGNOSIS — R1310 Dysphagia, unspecified: Secondary | ICD-10-CM | POA: Diagnosis present

## 2023-11-22 DIAGNOSIS — Z888 Allergy status to other drugs, medicaments and biological substances status: Secondary | ICD-10-CM | POA: Diagnosis not present

## 2023-11-22 DIAGNOSIS — F79 Unspecified intellectual disabilities: Secondary | ICD-10-CM | POA: Diagnosis present

## 2023-11-22 DIAGNOSIS — J159 Unspecified bacterial pneumonia: Secondary | ICD-10-CM | POA: Diagnosis present

## 2023-11-22 DIAGNOSIS — Z1152 Encounter for screening for COVID-19: Secondary | ICD-10-CM | POA: Diagnosis not present

## 2023-11-22 DIAGNOSIS — Z885 Allergy status to narcotic agent status: Secondary | ICD-10-CM | POA: Diagnosis not present

## 2023-11-22 DIAGNOSIS — Z90710 Acquired absence of both cervix and uterus: Secondary | ICD-10-CM | POA: Diagnosis not present

## 2023-11-22 LAB — CBC
HCT: 31.8 % — ABNORMAL LOW (ref 36.0–46.0)
Hemoglobin: 10.5 g/dL — ABNORMAL LOW (ref 12.0–15.0)
MCH: 31.2 pg (ref 26.0–34.0)
MCHC: 33 g/dL (ref 30.0–36.0)
MCV: 94.4 fL (ref 80.0–100.0)
Platelets: 121 10*3/uL — ABNORMAL LOW (ref 150–400)
RBC: 3.37 MIL/uL — ABNORMAL LOW (ref 3.87–5.11)
RDW: 15.1 % (ref 11.5–15.5)
WBC: 7.4 10*3/uL (ref 4.0–10.5)
nRBC: 0 % (ref 0.0–0.2)

## 2023-11-22 LAB — RESPIRATORY PANEL BY PCR

## 2023-11-22 LAB — AMMONIA: Ammonia: 33 umol/L (ref 9–35)

## 2023-11-22 LAB — BLOOD GAS, VENOUS
Acid-Base Excess: 1 mmol/L (ref 0.0–2.0)
Bicarbonate: 26 mmol/L (ref 20.0–28.0)
Drawn by: 1517
O2 Saturation: 66.8 %
Patient temperature: 36.4
pCO2, Ven: 41 mmHg — ABNORMAL LOW (ref 44–60)
pH, Ven: 7.41 (ref 7.25–7.43)
pO2, Ven: 37 mmHg (ref 32–45)

## 2023-11-22 LAB — HIV ANTIBODY (ROUTINE TESTING W REFLEX): HIV Screen 4th Generation wRfx: NONREACTIVE

## 2023-11-22 LAB — BASIC METABOLIC PANEL WITH GFR
Anion gap: 7 (ref 5–15)
BUN: 16 mg/dL (ref 8–23)
CO2: 24 mmol/L (ref 22–32)
Calcium: 8.4 mg/dL — ABNORMAL LOW (ref 8.9–10.3)
Chloride: 109 mmol/L (ref 98–111)
Creatinine, Ser: 0.82 mg/dL (ref 0.44–1.00)
GFR, Estimated: >60 mL/min (ref >60)
Glucose, Bld: 96 mg/dL (ref 70–99)
Potassium: 3.6 mmol/L (ref 3.5–5.1)
Sodium: 140 mmol/L (ref 135–145)

## 2023-11-22 LAB — TSH: TSH: 0.595 u[IU]/mL (ref 0.350–4.500)

## 2023-11-22 LAB — VITAMIN B12: Vitamin B-12: 765 pg/mL (ref 180–914)

## 2023-11-22 LAB — PHOSPHORUS: Phosphorus: 2.6 mg/dL (ref 2.5–4.6)

## 2023-11-22 LAB — MAGNESIUM: Magnesium: 1.8 mg/dL (ref 1.7–2.4)

## 2023-11-22 MED ORDER — SODIUM CHLORIDE 0.9 % IV SOLN: INTRAVENOUS | Status: DC

## 2023-11-22 MED ORDER — MAGNESIUM SULFATE 2 GM/50ML IV SOLN
2.0000 g | Freq: Once | INTRAVENOUS | Status: AC
  Administered 2023-11-22: 2 g via INTRAVENOUS
  Filled 2023-11-22: qty 50

## 2023-11-22 MED ORDER — SODIUM CHLORIDE 0.9 % IV BOLUS
500.0000 mL | Freq: Once | INTRAVENOUS | Status: AC
  Administered 2023-11-22: 500 mL via INTRAVENOUS

## 2023-11-22 NOTE — Evaluation (Signed)
 Physical Therapy Evaluation Patient Details Name: Maria [name] MRN: [id number] DOB: 1960/11/15 Today's Date: 11/22/2023  History of Present Illness  History limited by patients somnolence and intellectual disability   SAVREEN GEBHARDT is a 63 y.o. female with hx of seizure disorder, HFpEF, GERD, dysphagia, intellectual disability, who was brought in by family with symptoms of cough, fever, increased somnolence. Was seen in Ed for same symptoms on 6/19 and felt to be related to viral syndrome and discharged home. Spoke with her sister who reports that over the past few days has had symptoms including cough, increasingly productive today with yellow sputum, fever, sore throat, diffuse myalgias, and increased somnolence. Not at her baseline (oriented, conversant, independent with ADLS, although can be somnolent at times due to her AED medications). Denies trouble swallowing, coughing on food / drinks, though episode of coughing on a drink was reported to RN. No falls or head injury. Otherwise noted to have petechial rash on proximal arms and legs and sister notes this is chronic x years and unchanged, sister has a similar rash.   Clinical Impression  Patient demonstrates independence with bed mobility, functional transfers, and ambulation. Patient demonstrates flat effect throughout session, sister joins halfway through session and helps with subjective as pt has expressive difficulty. Patient remains on nasal cannula at this time due to current condition, no SOB noted following ambulation or transfers. Patient demonstrates no imbalance during ambulation or transfers at this date. Patient discharged from physical therapy to care of nursing for ambulation daily as tolerated for length of stay.          If plan is discharge home, recommend the following: A little help with walking and/or transfers;A little help with bathing/dressing/bathroom;Assistance with cooking/housework;Assist for transportation;Help  with stairs or ramp for entrance   Can travel by private vehicle        Equipment Recommendations None recommended by PT  Recommendations for Other Services       Functional Status Assessment Patient has had a recent decline in their functional status and demonstrates the ability to make significant improvements in function in a reasonable and predictable amount of time.     Precautions / Restrictions Precautions Precautions: None Recall of Precautions/Restrictions: Intact      Mobility  Bed Mobility Overal bed mobility: Independent                  Transfers Overall transfer level: Independent                      Ambulation/Gait Ambulation/Gait assistance: Independent Gait Distance (Feet): 30 Feet   Gait Pattern/deviations: WFL(Within Functional Limits) Gait velocity: decreased     General Gait Details: assist with IV pole and VC required for ambulating busy hospital room  Stairs            Wheelchair Mobility     Tilt Bed    Modified Rankin (Stroke Patients Only)       Balance Overall balance assessment: Independent                                           Pertinent Vitals/Pain Pain Assessment Pain Assessment: Faces Faces Pain Scale: Hurts little more Pain Location: injection site on stomach, IV placements Pain Descriptors / Indicators: Sharp, Sore Pain Intervention(s): Limited activity within patient's tolerance    Home Living Family/patient expects to  be discharged to:: Private residence Living Arrangements: Other relatives Available Help at Discharge: Family;Available 24 hours/day Type of Home: House Home Access: Stairs to enter Entrance Stairs-Rails: Right Entrance Stairs-Number of Steps: 1   Home Layout: One level (pt lives on one level) Home Equipment: Wheelchair - Oncologist (2 wheels)      Prior Function Prior Level of Function : Independent/Modified  Independent             Mobility Comments: household ambulator, independent, occasional usage of RW as needed ADLs Comments: pt lives with brother and sister which help with ADL pt needs assistance with     Extremity/Trunk Assessment   Upper Extremity Assessment Upper Extremity Assessment: Overall WFL for tasks assessed    Lower Extremity Assessment Lower Extremity Assessment: Overall WFL for tasks assessed    Cervical / Trunk Assessment Cervical / Trunk Assessment: Normal  Communication   Communication Communication: Impaired (expressive difficulties)    Cognition Arousal: Alert Behavior During Therapy: WFL for tasks assessed/performed, Flat affect   PT - Cognitive impairments: History of cognitive impairments                       PT - Cognition Comments: pt able to answer simple questions Following commands: Intact       Cueing Cueing Techniques: Verbal cues, Tactile cues     General Comments      Exercises     Assessment/Plan    PT Assessment Patient does not need any further PT services  PT Problem List         PT Treatment Interventions      PT Goals (Current goals can be found in the Care Plan section)  Acute Rehab PT Goals Patient Stated Goal: to return home PT Goal Formulation: With patient Time For Goal Achievement: 11/22/23 Potential to Achieve Goals: Good    Frequency       Co-evaluation               AM-PAC PT 6 Clicks Mobility  Outcome Measure Help needed turning from your back to your side while in a flat bed without using bedrails?: None Help needed moving from lying on your back to sitting on the side of a flat bed without using bedrails?: None Help needed moving to and from a bed to a chair (including a wheelchair)?: None Help needed standing up from a chair using your arms (e.g., wheelchair or bedside chair)?: None Help needed to walk in hospital room?: None Help needed climbing 3-5 steps with a railing? :  A Little 6 Click Score: 23    End of Session   Activity Tolerance: Patient tolerated treatment well Patient left: in bed;with bed alarm set;with family/visitor present        Time:  -      Charges:   PT Evaluation $PT Eval Low Complexity: 1 Low PT Treatments $Therapeutic Activity: 8-22 mins PT General Charges $$ ACUTE PT VISIT: 1 Visit         Lang Ada, PT, DPT [facility] Office: [phone] 10:16 AM, 11/22/23

## 2023-11-22 NOTE — Plan of Care (Signed)
   Problem: Education: Goal: Knowledge of General Education information will improve Description: Including pain rating scale, medication(s)/side effects and non-pharmacologic comfort measures Outcome: Progressing   Problem: Clinical Measurements: Goal: Ability to maintain clinical measurements within normal limits will improve Outcome: Progressing Goal: Diagnostic test results will improve Outcome: Progressing

## 2023-11-22 NOTE — Plan of Care (Signed)
  Problem: Clinical Measurements: Goal: Will remain free from infection Outcome: Progressing   Problem: Clinical Measurements: Goal: Respiratory complications will improve Outcome: Progressing   Problem: Activity: Goal: Risk for activity intolerance will decrease Outcome: Progressing   

## 2023-11-22 NOTE — Progress Notes (Signed)
 PROGRESS NOTE    RASHMI TALLENT  FMW:[id number] DOB: 1961/05/30 DOA: 11/21/2023 PCP: [name], MD   Brief Narrative: 63 year old with past medical history significant for seizure disorder, heart failure preserved ejection fraction, GERD, dysphagia, intellectual disability who was brought by family due to cough, fever, increased somnolence.  Over the last few days patient has been having worsening productive cough and shortness of breath and getting more sleepy.  Patient was found to have pneumonia.     Assessment & Plan:   Principal Problem:   CAP (community acquired pneumonia)  1-Community-acquired pneumonia Patient presented with viral-like type syndrome 3 days prior to admission seen in the ED and discharged with presumed viral syndrome (rhinovirus positive. ) - Send with worsening shortness of breath and cough.  Chest x-ray showed developing patchy bibasilar infiltrates. - Speech therapy evaluation - Continue with IV Unasyn  and azithromycin  - Follow sputum culture - Continue albuterol  -  Acute metabolic encephalopathy in the setting of pneumonia patient at baseline is alert, has some cognitive deficits, but she has been more lethargic In the setting of infection Ammonia: TSH: B12  Chronic Petechial rash: Upper extremity more than lower extremity.  This is a chronic rash unchanged for years Need to follow-up with dermatologist  Seizure disorder: Continue home valproate, lacosamide , clonazepam  twice daily  HFpEF, without acute exacerbation: Not on diuretics at home  HLD: Continue home atorvastatin   GERD, dysphagia: = PPI  ? OAB: Hold home vibegron  while inpatient  Intellectual disability: Noted, baseline is oriented, conversant, independent with ADLs.   Estimated body mass index is 21.63 kg/m as calculated from the following:   Height as of this encounter: 5' 6 (1.676 m).   Weight as of this encounter: 60.8 kg.   DVT prophylaxis: Lovenox  Code Status: Full  code Family Communication: Disposition Plan:  Status is: Observation The patient will require care spanning > 2 midnights and should be moved to inpatient because:     Consultants:  None  Procedures:    Antimicrobials:    Subjective: She is alert, denies pain, not feeling well.    Objective: Vitals:   11/21/23 2104 11/21/23 2300 11/22/23 0100 11/22/23 0504  BP:   124/61 130/74  Pulse: (!) 103  89 99  Resp: 20     Temp: (!) 101.2 F (38.4 C) 99.9 F (37.7 C) 98.4 F (36.9 C) 100.1 F (37.8 C)  TempSrc: Axillary Axillary Oral Axillary  SpO2: 91%  92% 93%  Weight:      Height:        Intake/Output Summary (Last 24 hours) at 11/22/2023 0815 Last data filed at 11/21/2023 2330 Gross per 24 hour  Intake 1100.67 ml  Output --  Net 1100.67 ml   Filed Weights   11/21/23 1934  Weight: 60.8 kg    Examination:  General exam: Appears calm and comfortable  Respiratory system: BL [place]. Respiratory effort normal. Cardiovascular system: S1 & S2 heard, RRR. No JVD, murmurs, rubs, gallops or clicks. No pedal edema. Gastrointestinal system: Abdomen is nondistended, soft and nontender. No organomegaly or masses felt. Normal bowel sounds heard. Central nervous system: Alert and oriented. No focal neurological deficits. Extremities: Symmetric 5 x 5 power.   Data Reviewed: I have personally reviewed following labs and imaging studies  CBC: Recent Labs  Lab 11/20/23 2339 11/21/23 2012 11/22/23 0404  WBC 12.3* 9.6 7.4  NEUTROABS 9.8*  --   --   HGB 12.3 12.7 10.5*  HCT 35.3* 38.0 31.8*  MCV 90.5  92.0 94.4  PLT 128* 134* 121*   Basic Metabolic Panel: Recent Labs  Lab 11/20/23 2339 11/21/23 2012 11/22/23 0404  NA 139 139 140  K 4.3 4.0 3.6  CL 105 103 109  CO2 23 23 24   GLUCOSE 105* 123* 96  BUN 18 17 16   CREATININE 0.84 1.03* 0.82  CALCIUM  9.5 9.5 8.4*  MG  --   --  1.8  PHOS  --   --  2.6   GFR: Estimated Creatinine Clearance: 67.4 mL/min (by C-G  formula based on SCr of 0.82 mg/dL). Liver Function Tests: Recent Labs  Lab 11/20/23 2339  AST 18  ALT 18  ALKPHOS 76  BILITOT 0.8  PROT 6.6  ALBUMIN  3.8   No results for input(s): LIPASE, AMYLASE in the last 168 hours. Recent Labs  Lab 11/22/23 0404  AMMONIA 33   Coagulation Profile: No results for input(s): INR, PROTIME in the last 168 hours. Cardiac Enzymes: No results for input(s): CKTOTAL, CKMB, CKMBINDEX, TROPONINI in the last 168 hours. BNP (last 3 results) No results for input(s): PROBNP in the last 8760 hours. HbA1C: No results for input(s): HGBA1C in the last 72 hours. CBG: No results for input(s): GLUCAP in the last 168 hours. Lipid Profile: No results for input(s): CHOL, HDL, LDLCALC, TRIG, CHOLHDL, LDLDIRECT in the last 72 hours. Thyroid  Function Tests: Recent Labs    11/22/23 0404  TSH 0.595   Anemia Panel: Recent Labs    11/22/23 0404  VITAMINB12 765   Sepsis Labs: No results for input(s): PROCALCITON, LATICACIDVEN in the last 168 hours.  Recent Results (from the past 240 hours)  Group A Strep by PCR if patient complains of sore throat.     Status: None   Collection Time: 11/20/23 10:00 PM   Specimen: Throat; Sterile Swab  Result Value Ref Range Status   Group A Strep by PCR NOT DETECTED NOT DETECTED Final    Comment: Performed at [facility], [address] [place], [place] [address]  Resp panel by RT-PCR (RSV, Flu A&B, Covid) Anterior Nasal Swab     Status: None   Collection Time: 11/20/23 11:40 PM   Specimen: Anterior Nasal Swab  Result Value Ref Range Status   SARS Coronavirus 2 by RT PCR NEGATIVE NEGATIVE Final    Comment: (NOTE) SARS-CoV-2 target nucleic acids are NOT DETECTED.  The SARS-CoV-2 RNA is generally detectable in upper respiratory specimens during the acute phase of infection. The lowest concentration of SARS-CoV-2 viral copies this assay can detect is 138 copies/mL. A negative result  does not preclude SARS-Cov-2 infection and should not be used as the sole basis for treatment or other patient management decisions. A negative result may occur with  improper specimen collection/handling, submission of specimen other than nasopharyngeal swab, presence of viral mutation(s) within the areas targeted by this assay, and inadequate number of viral copies(<138 copies/mL). A negative result must be combined with clinical observations, patient history, and epidemiological information. The expected result is Negative.  Fact Sheet for Patients:  BloggerCourse.com  Fact Sheet for Healthcare Providers:  SeriousBroker.it  This test is no t yet approved or cleared by the [place]  FDA and  has been authorized for detection and/or diagnosis of SARS-CoV-2 by FDA under an Emergency Use Authorization (EUA). This EUA will remain  in effect (meaning this test can be used) for the duration of the COVID-19 declaration under Section 564(b)(1) of the Act, 21 U.S.C.section 360bbb-3(b)(1), unless the authorization is terminated  or revoked  sooner.       Influenza A by PCR NEGATIVE NEGATIVE Final   Influenza B by PCR NEGATIVE NEGATIVE Final    Comment: (NOTE) The Xpert Xpress SARS-CoV-2/FLU/RSV plus assay is intended as an aid in the diagnosis of influenza from Nasopharyngeal swab specimens and should not be used as a sole basis for treatment. Nasal washings and aspirates are unacceptable for Xpert Xpress SARS-CoV-2/FLU/RSV testing.  Fact Sheet for Patients: BloggerCourse.com  Fact Sheet for Healthcare Providers: SeriousBroker.it  This test is not yet approved or cleared by the [place]  FDA and has been authorized for detection and/or diagnosis of SARS-CoV-2 by FDA under an Emergency Use Authorization (EUA). This EUA will remain in effect (meaning this test can be used) for  the duration of the COVID-19 declaration under Section 564(b)(1) of the Act, 21 U.S.C. section 360bbb-3(b)(1), unless the authorization is terminated or revoked.     Resp Syncytial Virus by PCR NEGATIVE NEGATIVE Final    Comment: (NOTE) Fact Sheet for Patients: BloggerCourse.com  Fact Sheet for Healthcare Providers: SeriousBroker.it  This test is not yet approved or cleared by the [place]  FDA and has been authorized for detection and/or diagnosis of SARS-CoV-2 by FDA under an Emergency Use Authorization (EUA). This EUA will remain in effect (meaning this test can be used) for the duration of the COVID-19 declaration under Section 564(b)(1) of the Act, 21 U.S.C. section 360bbb-3(b)(1), unless the authorization is terminated or revoked.  Performed at [facility], [address] [place], [place] [address]          Radiology Studies: CT HEAD WO CONTRAST ( ) Result Date: 11/22/2023 CLINICAL DATA:  Altered mental status EXAM: CT HEAD WITHOUT CONTRAST TECHNIQUE: Contiguous axial images were obtained from the base of the skull through the vertex without intravenous contrast. RADIATION DOSE REDUCTION: This exam was performed according to the departmental dose-optimization program which includes automated exposure control, adjustment of the mA and/or kV according to patient size and/or use of iterative reconstruction technique. COMPARISON:  04/15/2023 FINDINGS: Brain: Inferior right frontal pole encephalomalacia. Mild cerebellar volume loss, unchanged. No mass, hemorrhage or extra-axial collection. Vascular: No hyperdense vessel or unexpected vascular calcification. Skull: The visualized skull base, calvarium and extracranial soft tissues are normal. Sinuses/Orbits: No fluid levels or advanced mucosal thickening of the visualized paranasal sinuses. No mastoid or middle ear effusion. Normal orbits. Other: None. IMPRESSION: 1. No acute  intracranial abnormality. 2. Inferior right frontal pole encephalomalacia. 3. Mild cerebellar volume loss, unchanged. Electronically Signed   By: Franky Stanford M.D.   On: 11/22/2023 00:23   DG Chest Port 1 View Result Date: 11/21/2023 CLINICAL DATA:  Cough and weakness EXAM: PORTABLE CHEST 1 VIEW COMPARISON:  11/20/2023 FINDINGS: Right shoulder replacement. Patchy bibasilar airspace opacities. Stable cardiomediastinal silhouette with aortic atherosclerosis. No pleural effusion or pneumothorax IMPRESSION: Patchy bibasilar airspace opacities, possible pneumonia. Electronically Signed   By: Luke Bun M.D.   On: 11/21/2023 22:26   DG Chest Port 1 View Result Date: 11/20/2023 CLINICAL DATA:  Cough with fever, nausea and loss of appetite. EXAM: PORTABLE CHEST 1 VIEW COMPARISON:  June 16, 2023 FINDINGS: The heart size and mediastinal contours are within normal limits. There is no evidence of acute infiltrate, pleural effusion or pneumothorax. Evidence of prior right shoulder arthroplasty is noted. Multilevel degenerative changes seen throughout the thoracic spine. IMPRESSION: No active cardiopulmonary disease. Electronically Signed   By: Suzen Dials M.D.   On: 11/20/2023 23:50        Scheduled  Meds:  atorvastatin   20 mg Oral QHS   azithromycin   500 mg Oral QHS   clonazePAM   0.5 mg Oral BID   divalproex   1,000 mg Oral Daily   enoxaparin  (LOVENOX ) injection  40 mg Subcutaneous Q24H   lacosamide   200 mg Oral BID   pantoprazole   40 mg Oral Daily   sodium chloride  flush  3 mL Intravenous Q12H   Continuous Infusions:  sodium chloride  100 mL/hr at 11/22/23 0355   ampicillin -sulbactam (UNASYN ) IV       LOS: 0 days    Time spent: 35 minutes    Terissa Haffey A Zafar Debrosse, MD [facility]   If 7PM-7AM, please contact night-coverage [email or web address]  11/22/2023, 8:15 AM

## 2023-11-22 NOTE — H&P (Addendum)
 History and Physical    Maria [name] FMW:[id number] DOB: 1960/12/19 DOA: 11/21/2023  PCP: [name], MD   Patient coming from: Home   Chief Complaint:  Chief Complaint  Patient presents with   Illness    HPI: History limited by patients somnolence and intellectual disability  Maria [name] is a 63 y.o. female with hx of seizure disorder, HFpEF, GERD, dysphagia, intellectual disability, who was brought in by family with symptoms of cough, fever, increased somnolence. Was seen in Ed for same symptoms on 6/19 and felt to be related to viral syndrome and discharged home. Spoke with her sister who reports that over the past few days has had symptoms including cough, increasingly productive today with yellow sputum, fever, sore throat, diffuse myalgias, and increased somnolence. Not at her baseline (oriented, conversant, independent with ADLS, although can be somnolent at times due to her AED medications). Denies trouble swallowing, coughing on food / drinks, though episode of coughing on a drink was reported to RN. No falls or head injury. Otherwise noted to have petechial rash on proximal arms and legs and sister notes this is chronic x years and unchanged, sister has a similar rash.   On interview patient somnolent able to wake up briefly and follows commands intermittently. Though often responds I dont want to.    Review of Systems:  ROS complete and negative except as marked above   Allergies  Allergen Reactions   Codeine Nausea And Vomiting   Lamictal [Lamotrigine] Rash    Prior to Admission medications   Medication Sig Start Date End Date Taking? Authorizing Provider  aspirin  EC 81 MG tablet Take 81 mg by mouth daily. Swallow whole.    [provider]  atorvastatin  (LIPITOR) 20 MG tablet Take 20 mg by mouth at bedtime. 08/24/18   [provider]  benzonatate  (TESSALON ) 100 MG capsule Take 1 capsule (100 mg total) by mouth every 8 (eight) hours. 11/21/23    Emil Share, DO  Calcium  Carb-Cholecalciferol (CALCIUM /VITAMIN D  PO) Take 1 tablet by mouth 2 (two) times daily. 600 mg / 800 units    [provider]  calcium  carbonate (TUMS - DOSED IN MG ELEMENTAL CALCIUM ) 500 MG chewable tablet Chew 1 tablet by mouth daily as needed for indigestion or heartburn.    [provider]  cholecalciferol (VITAMIN D3) 25 MCG (1000 UNIT) tablet Take 1,000 Units by mouth 2 (two) times daily.    [provider]  clonazePAM  (KLONOPIN ) 0.5 MG tablet Take 1 tablet (0.5 mg total) by mouth 2 (two) times daily. 11/04/23   Gayland Lauraine PARAS, NP  divalproex  (DEPAKOTE ) 500 MG DR tablet Take 2 tablets (1,000 mg total) by mouth daily. 11/17/23 11/16/24  Gayland Lauraine PARAS, NP  HYDROcodone -acetaminophen  (NORCO) 5-325 MG tablet Take 1-2 tablets by mouth every 6 (six) hours as needed for severe pain (pain score 7-10). 06/12/23   McBane, Caroline N, PA-C  lacosamide  (VIMPAT ) 200 MG TABS tablet Take 1 tablet (200 mg total) by mouth 2 (two) times daily. 11/17/23   Gayland Lauraine PARAS, NP  Multiple Vitamin (MULTIVITAMIN) capsule Take 1 capsule by mouth daily.    [provider]  omeprazole  (PRILOSEC) 40 MG capsule TAKE 1 CAPSULE DAILY (NEED OFFICE VISIT) 10/30/23   Eartha Flavors, Toribio, MD  ondansetron  (ZOFRAN -ODT) 4 MG disintegrating tablet Take 1 tablet (4 mg total) by mouth every 8 (eight) hours as needed for nausea or vomiting. 06/16/23   Small, Brooke L, PA  senna (SENOKOT) 8.6  MG tablet Take 2 tablets by mouth daily.    [provider]  Vibegron  (GEMTESA ) 75 MG TABS Take 1 tablet (75 mg total) by mouth daily. 08/05/23   Matilda Senior, MD  QUEtiapine  (SEROQUEL ) 25 MG tablet Take 1 tablet (25 mg total) by mouth 2 (two) times daily. 05/31/20 06/06/20  Angiulli, Toribio PARAS, PA-C    Past Medical History:  Diagnosis Date   Constipation    Dysphagia    Fracture    R foot   GERD (gastroesophageal reflux disease)    Headache    History of shingles 10/2016    Mental retardation    lesion in head   Pneumonia    Seizures (HCC)    Last seizure 05/31/23   Thrombocytopenia (HCC)    Tremor     Past Surgical History:  Procedure Laterality Date   BIOPSY  09/16/2014   Procedure: BIOPSY;  Surgeon: Claudis RAYMOND Rivet, MD;  Location: [facility];  Service: Endoscopy;;   CATARACT EXTRACTION     both eyes, May of 2015   COLONOSCOPY     COLONOSCOPY WITH PROPOFOL  N/A 11/20/2018   Procedure: COLONOSCOPY WITH PROPOFOL ;  Surgeon: Rivet Claudis RAYMOND, MD;  Location: [facility];  Service: Endoscopy;  Laterality: N/A;   COLONOSCOPY WITH PROPOFOL  N/A 08/07/2022   Procedure: COLONOSCOPY WITH PROPOFOL ;  Surgeon: Eartha Angelia Toribio, MD;  Location: [facility];  Service: Gastroenterology;  Laterality: N/A;  7:30am, asa 1-2   ESOPHAGEAL DILATION N/A 09/16/2014   Procedure: ESOPHAGEAL DILATION WITH 54FR MALONEY DILATOR;  Surgeon: Claudis RAYMOND Rivet, MD;  Location: [facility];  Service: Endoscopy;  Laterality: N/A;   ESOPHAGOGASTRODUODENOSCOPY (EGD) WITH PROPOFOL  N/A 09/16/2014   Procedure: ESOPHAGOGASTRODUODENOSCOPY (EGD) WITH PROPOFOL ;  Surgeon: Claudis RAYMOND Rivet, MD;  Location: [facility];  Service: Endoscopy;  Laterality: N/A;   HEMOSTASIS CLIP PLACEMENT  08/07/2022   Procedure: HEMOSTASIS CLIP PLACEMENT;  Surgeon: Eartha Angelia Toribio, MD;  Location: [facility];  Service: Gastroenterology;;   LAPAROSCOPIC APPENDECTOMY N/A 07/14/2020   Procedure: APPENDECTOMY LAPAROSCOPIC;  Surgeon: Kallie Manuelita BROCKS, MD;  Location: [facility];  Service: General;  Laterality: N/A;   MOUTH SURGERY     POLYPECTOMY  11/20/2018   Procedure: POLYPECTOMY;  Surgeon: Rivet Claudis RAYMOND, MD;  Location: [facility];  Service: Endoscopy;;  colon   POLYPECTOMY  08/07/2022   Procedure: POLYPECTOMY;  Surgeon: Eartha Angelia Toribio, MD;  Location: [facility];  Service: Gastroenterology;;   REVERSE SHOULDER ARTHROPLASTY Right 06/12/2023   Procedure: REVERSE SHOULDER ARTHROPLASTY;  Surgeon: Cristy Bonner DASEN, MD;   Location: [facility];  Service: Orthopedics;  Laterality: Right;   Skin graft to gum Right 08/2013   SUBMUCOSAL LIFTING INJECTION  08/07/2022   Procedure: SUBMUCOSAL LIFTING INJECTION;  Surgeon: Eartha Angelia Toribio, MD;  Location: [facility];  Service: Gastroenterology;;   TONSILLECTOMY AND ADENOIDECTOMY     TOTAL ABDOMINAL HYSTERECTOMY       reports that she quit smoking about 17 years ago. Her smoking use included cigarettes. She started smoking about 32 years ago. She has a 22.5 pack-year smoking history. She has never used smokeless tobacco. She reports that she does not drink alcohol and does not use drugs.  Family History  Problem Relation Age of Onset   High Cholesterol Mother    High blood pressure Mother    Diabetes Father      Physical Exam: Vitals:   11/21/23 2045 11/21/23 2104 11/21/23 2300 11/22/23 0100  BP: 112/62   124/61  Pulse: (!) 102 (!) 103  89  Resp:  20    Temp:  (!) 101.2 F (38.4 C) 99.9 F (37.7 C) 98.4 F (36.9 C)  TempSrc:  Axillary Axillary Oral  SpO2: 93% 91%  92%  Weight:      Height:        Gen: somnolent, awakens to moderate nonpainful stimuli and maintains brief alertness before falling back asleep. chronically ill appearing  HEENT: Head atraumatic,  Neck: supple, no meningismus  CV: Regular, normal S1, S2, no murmurs  Resp: Normal WOB, on [place], limited due to her ability to position., clear anteriorly Abd: Round, normoactive, nontender MSK: Symmetric, no edema  Skin: There is a palpable petechial rash which is worst in the proximal upper arms, but present on proximal LE as well (sister states chronic and unchanged)   Neuro: somnolent, awakens to moderate nonpainful stimuli and maintains brief alertness before falling back asleep. Speech is clear. Unable to assess orientation. CN exam limited, PERRL, tongue midline, no facial droop. Moves all extremities spontaneously., localizes to pain.  Psych: Encephalopathic / somnolent    Data  review:   Labs reviewed, notable for:   Creatinine 1 up from prior WBC 9 Flu/COVID/RSV yesterday negative   Micro:  Results for orders placed or performed during the hospital encounter of 11/20/23  Group A Strep by PCR if patient complains of sore throat.     Status: None   Collection Time: 11/20/23 10:00 PM   Specimen: Throat; Sterile Swab  Result Value Ref Range Status   Group A Strep by PCR NOT DETECTED NOT DETECTED Final    Comment: Performed at [facility], [address] [place], [place] [address]  Resp panel by RT-PCR (RSV, Flu A&B, Covid) Anterior Nasal Swab     Status: None   Collection Time: 11/20/23 11:40 PM   Specimen: Anterior Nasal Swab  Result Value Ref Range Status   SARS Coronavirus 2 by RT PCR NEGATIVE NEGATIVE Final    Comment: (NOTE) SARS-CoV-2 target nucleic acids are NOT DETECTED.  The SARS-CoV-2 RNA is generally detectable in upper respiratory specimens during the acute phase of infection. The lowest concentration of SARS-CoV-2 viral copies this assay can detect is 138 copies/mL. A negative result does not preclude SARS-Cov-2 infection and should not be used as the sole basis for treatment or other patient management decisions. A negative result may occur with  improper specimen collection/handling, submission of specimen other than nasopharyngeal swab, presence of viral mutation(s) within the areas targeted by this assay, and inadequate number of viral copies(<138 copies/mL). A negative result must be combined with clinical observations, patient history, and epidemiological information. The expected result is Negative.  Fact Sheet for Patients:  BloggerCourse.com  Fact Sheet for Healthcare Providers:  SeriousBroker.it  This test is no t yet approved or cleared by the [place]  FDA and  has been authorized for detection and/or diagnosis of SARS-CoV-2 by FDA under an Emergency Use Authorization  (EUA). This EUA will remain  in effect (meaning this test can be used) for the duration of the COVID-19 declaration under Section 564(b)(1) of the Act, 21 U.S.C.section 360bbb-3(b)(1), unless the authorization is terminated  or revoked sooner.       Influenza A by PCR NEGATIVE NEGATIVE Final   Influenza B by PCR NEGATIVE NEGATIVE Final    Comment: (NOTE) The Xpert Xpress SARS-CoV-2/FLU/RSV plus assay is intended as an aid in the diagnosis of influenza from Nasopharyngeal swab specimens and should not be used  as a sole basis for treatment. Nasal washings and aspirates are unacceptable for Xpert Xpress SARS-CoV-2/FLU/RSV testing.  Fact Sheet for Patients: BloggerCourse.com  Fact Sheet for Healthcare Providers: SeriousBroker.it  This test is not yet approved or cleared by the [place]  FDA and has been authorized for detection and/or diagnosis of SARS-CoV-2 by FDA under an Emergency Use Authorization (EUA). This EUA will remain in effect (meaning this test can be used) for the duration of the COVID-19 declaration under Section 564(b)(1) of the Act, 21 U.S.C. section 360bbb-3(b)(1), unless the authorization is terminated or revoked.     Resp Syncytial Virus by PCR NEGATIVE NEGATIVE Final    Comment: (NOTE) Fact Sheet for Patients: BloggerCourse.com  Fact Sheet for Healthcare Providers: SeriousBroker.it  This test is not yet approved or cleared by the [place]  FDA and has been authorized for detection and/or diagnosis of SARS-CoV-2 by FDA under an Emergency Use Authorization (EUA). This EUA will remain in effect (meaning this test can be used) for the duration of the COVID-19 declaration under Section 564(b)(1) of the Act, 21 U.S.C. section 360bbb-3(b)(1), unless the authorization is terminated or revoked.  Performed at [facility], [address] [place], [place]  [address]     Imaging reviewed:  CT HEAD WO CONTRAST ( ) Result Date: 11/22/2023 CLINICAL DATA:  Altered mental status EXAM: CT HEAD WITHOUT CONTRAST TECHNIQUE: Contiguous axial images were obtained from the base of the skull through the vertex without intravenous contrast. RADIATION DOSE REDUCTION: This exam was performed according to the departmental dose-optimization program which includes automated exposure control, adjustment of the mA and/or kV according to patient size and/or use of iterative reconstruction technique. COMPARISON:  04/15/2023 FINDINGS: Brain: Inferior right frontal pole encephalomalacia. Mild cerebellar volume loss, unchanged. No mass, hemorrhage or extra-axial collection. Vascular: No hyperdense vessel or unexpected vascular calcification. Skull: The visualized skull base, calvarium and extracranial soft tissues are normal. Sinuses/Orbits: No fluid levels or advanced mucosal thickening of the visualized paranasal sinuses. No mastoid or middle ear effusion. Normal orbits. Other: None. IMPRESSION: 1. No acute intracranial abnormality. 2. Inferior right frontal pole encephalomalacia. 3. Mild cerebellar volume loss, unchanged. Electronically Signed   By: Franky Stanford M.D.   On: 11/22/2023 00:23   DG Chest Port 1 View Result Date: 11/21/2023 CLINICAL DATA:  Cough and weakness EXAM: PORTABLE CHEST 1 VIEW COMPARISON:  11/20/2023 FINDINGS: Right shoulder replacement. Patchy bibasilar airspace opacities. Stable cardiomediastinal silhouette with aortic atherosclerosis. No pleural effusion or pneumothorax IMPRESSION: Patchy bibasilar airspace opacities, possible pneumonia. Electronically Signed   By: Luke Bun M.D.   On: 11/21/2023 22:26   DG Chest Port 1 View Result Date: 11/20/2023 CLINICAL DATA:  Cough with fever, nausea and loss of appetite. EXAM: PORTABLE CHEST 1 VIEW COMPARISON:  June 16, 2023 FINDINGS: The heart size and mediastinal contours are within normal limits. There is no  evidence of acute infiltrate, pleural effusion or pneumothorax. Evidence of prior right shoulder arthroplasty is noted. Multilevel degenerative changes seen throughout the thoracic spine. IMPRESSION: No active cardiopulmonary disease. Electronically Signed   By: Suzen Dials M.D.   On: 11/20/2023 23:50   Personally reviewed chest x-ray; new is L, old is R.     ED Course:  Treated with 1 L NS, Ceftriaxone , azithromycin .  Placed on 2 L O2 for low normal sats at 90 on room air   Assessment/Plan:  63 y.o. female with hx seizure disorder, HFpEF, HLD, GERD, dysphagia, intellectual disability, who was brought in by family with  symptoms of cough, fever, increased somnolence.  Admitted with community-acquired pneumonia  Community-acquired pneumonia, ? Viral  At risk of aspiration Acute onset of viral type syndrome x 3 days, with cough, sore throat, myalgias. Seen in Ed 6/19 and discharged with presumed viral syndrome. Returns due to worsening symptoms. On evaluation Febrile to 38.4, transiently tachycardic in low 100s, improved with IVF. WBC 9. Recent Flu / COVID / RSV neg. CXR with developing patchy bibasilar infiltrate, appears worse on the R on my review. Family had reported coughing after drinking to RN, but denied for me. Certainly at risk of aspiration with her somnolence.  -Switch to Unasyn  3g IV q 6 hr with possible aspiration, continue azithromycin  500 mg daily -Check RVP, sputum culture if can provide -Albuterol  q 4 hr prn, IS, flutter valve, encourage OOB, aspiration precautions  -Placed on 2 L although did not desat, wean off oxygen  sat goal greater than 92  Encephalopathy, presumed metabolic related to infection  Hx of chronic intermittent somnolence attributed to AED, worse now. No FND. CT Head without acute findings. Lab eval unremarkable thus far.  -Management of pneumonia per above -Check ammonia, VBG, TSH, B12  Petechial rash  Note petechial rash to proximal UE > LE. Sister  states this is chronic and unchanged x years.   -Defer inpatient workup due to chronicity, recommend outpatient follow-up.   Chronic medical problems: Seizure disorder: Continue home valproate, lacosamide , clonazepam  twice daily HFpEF, without acute exacerbation: Not on diuretics at home HLD: Continue home atorvastatin  GERD, dysphagia: Sub home PPI ? OAB: Hold home vibegron  while inpatient Intellectual disability: Noted, baseline is oriented, conversant, independent with ADLs.  Body mass index is 21.63 kg/m.    DVT prophylaxis:  SCDs Code Status:  Full Code Diet:  Diet Orders (From admission, onward)     Start     Ordered   11/21/23 2314  Diet regular Room service appropriate? Yes; Fluid consistency: Thin  Diet effective now       Question Answer Comment  Room service appropriate? Yes   Fluid consistency: Thin      11/21/23 2314           Family Communication:  Yes discussed with sister at bedside   Consults:  None   Admission status:   Observation, Telemetry bed  Severity of Illness: The appropriate patient status for this patient is OBSERVATION. Observation status is judged to be reasonable and necessary in order to provide the required intensity of service to ensure the patient's safety. The patient's presenting symptoms, physical exam findings, and initial radiographic and laboratory data in the context of their medical condition is felt to place them at decreased risk for further clinical deterioration. Furthermore, it is anticipated that the patient will be medically stable for discharge from the hospital within 2 midnights of admission.    Dorn Dawson, MD [facility]  How to contact the [facility] Attending or Consulting provider 7A - 7P or covering provider during after hours 7P -7A, for this patient.  Check the care team in [facility] and look for a) attending/consulting [facility] provider listed and b) the [facility] team listed Log into [email or web address] and use [place]'s  universal password to access. If you do not have the password, please contact the hospital operator. Locate the [facility] provider you are looking for under [facility] and page to a number that you can be directly reached. If you still have difficulty reaching the provider, please page the [facility] (Director on Call) for the  Hospitalists listed on amion for assistance.  11/22/2023, 3:16 AM

## 2023-11-22 NOTE — Care Management Obs Status (Signed)
 MEDICARE OBSERVATION STATUS NOTIFICATION   Patient Details  Name: KALE DOLS MRN: [id number] Date of Birth: [birth year]   Medicare Observation Status Notification Given:  Yes    Nena LITTIE Coffee, RN 11/22/2023, 11:16 AM

## 2023-11-22 NOTE — Progress Notes (Signed)
   11/22/23 1738  TOC Brief Assessment  Insurance and Status Reviewed  Patient has primary care physician Yes  Home environment has been reviewed From home c/sibling  Prior level of function: Assisted  Prior/Current Home Services No current home services  Social Drivers of Health Review SDOH reviewed no interventions necessary  Readmission risk has been reviewed Yes  Transition of care needs no transition of care needs at this time   Transition of Care Department [facility]) has reviewed patient and no TOC needs have been identified at this time. We will continue to monitor patient advancement through interdisciplinary progression rounds. If new patient transition needs arise, please place a TOC consult.

## 2023-11-23 DIAGNOSIS — J189 Pneumonia, unspecified organism: Secondary | ICD-10-CM | POA: Diagnosis not present

## 2023-11-23 LAB — CBC
HCT: 29.4 % — ABNORMAL LOW (ref 36.0–46.0)
Hemoglobin: 9.8 g/dL — ABNORMAL LOW (ref 12.0–15.0)
MCH: 30.5 pg (ref 26.0–34.0)
MCHC: 33.3 g/dL (ref 30.0–36.0)
MCV: 91.6 fL (ref 80.0–100.0)
Platelets: 106 10*3/uL — ABNORMAL LOW (ref 150–400)
RBC: 3.21 MIL/uL — ABNORMAL LOW (ref 3.87–5.11)
RDW: 14.4 % (ref 11.5–15.5)
WBC: 5.6 10*3/uL (ref 4.0–10.5)
nRBC: 0 % (ref 0.0–0.2)

## 2023-11-23 LAB — BASIC METABOLIC PANEL WITH GFR
Anion gap: 11 (ref 5–15)
BUN: 13 mg/dL (ref 8–23)
CO2: 22 mmol/L (ref 22–32)
Calcium: 8.7 mg/dL — ABNORMAL LOW (ref 8.9–10.3)
Chloride: 106 mmol/L (ref 98–111)
Creatinine, Ser: 0.71 mg/dL (ref 0.44–1.00)
GFR, Estimated: >60 mL/min (ref >60)
Glucose, Bld: 112 mg/dL — ABNORMAL HIGH (ref 70–99)
Potassium: 3.4 mmol/L — ABNORMAL LOW (ref 3.5–5.1)
Sodium: 139 mmol/L (ref 135–145)

## 2023-11-23 MED ORDER — IPRATROPIUM-ALBUTEROL 0.5-2.5 (3) MG/3ML IN SOLN
3.0000 mL | Freq: Two times a day (BID) | RESPIRATORY_TRACT | Status: DC
  Administered 2023-11-23 – 2023-11-24 (×3): 3 mL via RESPIRATORY_TRACT
  Filled 2023-11-23 (×3): qty 3

## 2023-11-23 MED ORDER — MENTHOL 3 MG MT LOZG
1.0000 | LOZENGE | OROMUCOSAL | Status: DC | PRN
  Administered 2023-11-23 – 2023-11-24 (×2): 3 mg via ORAL
  Filled 2023-11-23 (×3): qty 9

## 2023-11-23 MED ORDER — POTASSIUM CHLORIDE 20 MEQ PO PACK
40.0000 meq | PACK | Freq: Once | ORAL | Status: AC
  Administered 2023-11-23: 40 meq via ORAL
  Filled 2023-11-23: qty 2

## 2023-11-23 MED ORDER — GUAIFENESIN-DM 100-10 MG/5ML PO SYRP
5.0000 mL | ORAL_SOLUTION | ORAL | Status: DC | PRN
  Administered 2023-11-23 – 2023-11-24 (×3): 5 mL via ORAL
  Filled 2023-11-23 (×3): qty 5

## 2023-11-23 MED ORDER — GUAIFENESIN ER 600 MG PO TB12
600.0000 mg | ORAL_TABLET | Freq: Two times a day (BID) | ORAL | Status: DC
  Administered 2023-11-23 – 2023-11-24 (×3): 600 mg via ORAL
  Filled 2023-11-23 (×3): qty 1

## 2023-11-23 NOTE — Progress Notes (Signed)
 PROGRESS NOTE    Maria [name]  FMW:[id number] DOB: [birth year] DOA: 11/21/2023 PCP: [name], MD   Brief Narrative: 63 year old with past medical history significant for seizure disorder, heart failure preserved ejection fraction, GERD, dysphagia, intellectual disability who was brought by family due to cough, fever, increased somnolence.  Over the last few days patient has been having worsening productive cough and shortness of breath and getting more sleepy.  Patient was found to have pneumonia.    Assessment & Plan:   Principal Problem:   CAP (community acquired pneumonia)  1-Community-acquired pneumonia Patient presented with viral-like type syndrome 3 days prior to admission seen in the ED and discharged with presumed viral syndrome (rhinovirus positive. ) - Presents with worsening shortness of breath and cough.  Chest x-ray showed developing patchy bibasilar infiltrates. - Speech therapy evaluation pending.  - Continue with IV Unasyn  and azithromycin  for superimpose bacterial PNA - Follow sputum culture - Continue albuterol  -schedule Guaifenesin  and Duonebs.   Acute metabolic encephalopathy in the setting of pneumonia patient at baseline is alert, has some cognitive deficits, but she has been more lethargic In the setting of infection Ammonia: TSH: 0.5 B12 765  Chronic Petechial rash: Upper extremity more than lower extremity.  This is a chronic rash unchanged for years Need to follow-up with dermatologist  Seizure disorder: Continue home valproate, lacosamide , clonazepam  twice daily  HFpEF, without acute exacerbation: Not on diuretics at home Anemia; Monitor hb. No evidence of active bleeding.  Previous Hb at 10.  Check anemia panel in am.   HLD: Continue home atorvastatin   GERD, dysphagia: = PPI  ? OAB: Hold home vibegron  while inpatient  Intellectual disability: Noted, baseline is oriented, conversant, independent with ADLs.   Estimated body mass index is  21.63 kg/m as calculated from the following:   Height as of this encounter: 5' 6 (1.676 m).   Weight as of this encounter: 60.8 kg.   DVT prophylaxis: Lovenox  Code Status: Full code Family Communication: Sister at Bedside Disposition Plan:  Status is: Observation The patient will require care spanning > 2 midnights and should be moved to inpatient because:     Consultants:  None  Procedures:    Antimicrobials:    Subjective: She has been coughing more, dry cough. She doesn't like to have oxygen  on. Nurse will wean as tolerated.  No new complaints.   Objective: Vitals:   11/22/23 1318 11/22/23 1958 11/23/23 0426 11/23/23 1058  BP: (!) 106/56 114/61 139/64   Pulse: 74 97 79   Resp: 20 18    Temp: 98.9 F (37.2 C) 98.9 F (37.2 C) (!) 97.5 F (36.4 C)   TempSrc: Oral Oral Oral   SpO2: 94% 97% 96% 93%  Weight:      Height:        Intake/Output Summary (Last 24 hours) at 11/23/2023 1231 Last data filed at 11/23/2023 0500 Gross per 24 hour  Intake 600 ml  Output --  Net 600 ml   Filed Weights   11/21/23 1934  Weight: 60.8 kg    Examination:  General exam: NAD Respiratory system: BL ronchus Cardiovascular system: S 1, S 2 RRR Gastrointestinal system: BS present, soft, nt Central nervous system: Alert    Data Reviewed: I have personally reviewed following labs and imaging studies  CBC: Recent Labs  Lab 11/20/23 2339 11/21/23 2012 11/22/23 0404 11/23/23 0824  WBC 12.3* 9.6 7.4 5.6  NEUTROABS 9.8*  --   --   --  HGB 12.3 12.7 10.5* 9.8*  HCT 35.3* 38.0 31.8* 29.4*  MCV 90.5 92.0 94.4 91.6  PLT 128* 134* 121* 106*   Basic Metabolic Panel: Recent Labs  Lab 11/20/23 2339 11/21/23 2012 11/22/23 0404 11/23/23 0824  NA 139 139 140 139  K 4.3 4.0 3.6 3.4*  CL 105 103 109 106  CO2 23 23 24 22   GLUCOSE 105* 123* 96 112*  BUN 18 17 16 13   CREATININE 0.84 1.03* 0.82 0.71  CALCIUM  9.5 9.5 8.4* 8.7*  MG  --   --  1.8  --   PHOS  --   --  2.6   --    GFR: Estimated Creatinine Clearance: 69.1 mL/min (by C-G formula based on SCr of 0.71 mg/dL). Liver Function Tests: Recent Labs  Lab 11/20/23 2339  AST 18  ALT 18  ALKPHOS 76  BILITOT 0.8  PROT 6.6  ALBUMIN  3.8   No results for input(s): LIPASE, AMYLASE in the last 168 hours. Recent Labs  Lab 11/22/23 0404  AMMONIA 33   Coagulation Profile: No results for input(s): INR, PROTIME in the last 168 hours. Cardiac Enzymes: No results for input(s): CKTOTAL, CKMB, CKMBINDEX, TROPONINI in the last 168 hours. BNP (last 3 results) No results for input(s): PROBNP in the last 8760 hours. HbA1C: No results for input(s): HGBA1C in the last 72 hours. CBG: No results for input(s): GLUCAP in the last 168 hours. Lipid Profile: No results for input(s): CHOL, HDL, LDLCALC, TRIG, CHOLHDL, LDLDIRECT in the last 72 hours. Thyroid  Function Tests: Recent Labs    11/22/23 0404  TSH 0.595   Anemia Panel: Recent Labs    11/22/23 0404  VITAMINB12 765   Sepsis Labs: No results for input(s): PROCALCITON, LATICACIDVEN in the last 168 hours.  Recent Results (from the past 240 hours)  Group A Strep by PCR if patient complains of sore throat.     Status: None   Collection Time: 11/20/23 10:00 PM   Specimen: Throat; Sterile Swab  Result Value Ref Range Status   Group A Strep by PCR NOT DETECTED NOT DETECTED Final    Comment: Performed at [facility], [address] [place], [place] [address]  Resp panel by RT-PCR (RSV, Flu A&B, Covid) Anterior Nasal Swab     Status: None   Collection Time: 11/20/23 11:40 PM   Specimen: Anterior Nasal Swab  Result Value Ref Range Status   SARS Coronavirus 2 by RT PCR NEGATIVE NEGATIVE Final    Comment: (NOTE) SARS-CoV-2 target nucleic acids are NOT DETECTED.  The SARS-CoV-2 RNA is generally detectable in upper respiratory specimens during the acute phase of infection. The lowest concentration of SARS-CoV-2 viral  copies this assay can detect is 138 copies/mL. A negative result does not preclude SARS-Cov-2 infection and should not be used as the sole basis for treatment or other patient management decisions. A negative result may occur with  improper specimen collection/handling, submission of specimen other than nasopharyngeal swab, presence of viral mutation(s) within the areas targeted by this assay, and inadequate number of viral copies(<138 copies/mL). A negative result must be combined with clinical observations, patient history, and epidemiological information. The expected result is Negative.  Fact Sheet for Patients:  BloggerCourse.com  Fact Sheet for Healthcare Providers:  SeriousBroker.it  This test is no t yet approved or cleared by the [place]  FDA and  has been authorized for detection and/or diagnosis of SARS-CoV-2 by FDA under an Emergency Use Authorization (EUA). This EUA will remain  in effect (meaning this test can be used) for the duration of the COVID-19 declaration under Section 564(b)(1) of the Act, 21 U.S.C.section 360bbb-3(b)(1), unless the authorization is terminated  or revoked sooner.       Influenza A by PCR NEGATIVE NEGATIVE Final   Influenza B by PCR NEGATIVE NEGATIVE Final    Comment: (NOTE) The Xpert Xpress SARS-CoV-2/FLU/RSV plus assay is intended as an aid in the diagnosis of influenza from Nasopharyngeal swab specimens and should not be used as a sole basis for treatment. Nasal washings and aspirates are unacceptable for Xpert Xpress SARS-CoV-2/FLU/RSV testing.  Fact Sheet for Patients: BloggerCourse.com  Fact Sheet for Healthcare Providers: SeriousBroker.it  This test is not yet approved or cleared by the [place]  FDA and has been authorized for detection and/or diagnosis of SARS-CoV-2 by FDA under an Emergency Use Authorization (EUA). This  EUA will remain in effect (meaning this test can be used) for the duration of the COVID-19 declaration under Section 564(b)(1) of the Act, 21 U.S.C. section 360bbb-3(b)(1), unless the authorization is terminated or revoked.     Resp Syncytial Virus by PCR NEGATIVE NEGATIVE Final    Comment: (NOTE) Fact Sheet for Patients: BloggerCourse.com  Fact Sheet for Healthcare Providers: SeriousBroker.it  This test is not yet approved or cleared by the [place]  FDA and has been authorized for detection and/or diagnosis of SARS-CoV-2 by FDA under an Emergency Use Authorization (EUA). This EUA will remain in effect (meaning this test can be used) for the duration of the COVID-19 declaration under Section 564(b)(1) of the Act, 21 U.S.C. section 360bbb-3(b)(1), unless the authorization is terminated or revoked.  Performed at [facility], [address], [place], [place] [address]   Respiratory (~20 pathogens) panel by PCR     Status: Abnormal   Collection Time: 11/22/23  7:46 AM   Specimen: Nasopharyngeal Swab; Respiratory  Result Value Ref Range Status   Adenovirus NOT DETECTED NOT DETECTED Final   Coronavirus 229E NOT DETECTED NOT DETECTED Final    Comment: (NOTE) The Coronavirus on the Respiratory Panel, DOES NOT test for the novel  Coronavirus (2019 nCoV)    Coronavirus HKU1 NOT DETECTED NOT DETECTED Final   Coronavirus NL63 NOT DETECTED NOT DETECTED Final   Coronavirus OC43 NOT DETECTED NOT DETECTED Final   Metapneumovirus NOT DETECTED NOT DETECTED Final   Rhinovirus / Enterovirus DETECTED (A) NOT DETECTED Final   Influenza A NOT DETECTED NOT DETECTED Final   Influenza B NOT DETECTED NOT DETECTED Final   Parainfluenza Virus 1 NOT DETECTED NOT DETECTED Final   Parainfluenza Virus 2 NOT DETECTED NOT DETECTED Final   Parainfluenza Virus 3 NOT DETECTED NOT DETECTED Final   Parainfluenza Virus 4 NOT DETECTED NOT DETECTED Final    Respiratory Syncytial Virus NOT DETECTED NOT DETECTED Final   Bordetella pertussis NOT DETECTED NOT DETECTED Final   Bordetella Parapertussis NOT DETECTED NOT DETECTED Final   Chlamydophila pneumoniae NOT DETECTED NOT DETECTED Final   Mycoplasma pneumoniae NOT DETECTED NOT DETECTED Final    Comment: Performed at [facility], [address] [place], [place] [address]         Radiology Studies: CT HEAD WO CONTRAST ( ) Result Date: 11/22/2023 CLINICAL DATA:  Altered mental status EXAM: CT HEAD WITHOUT CONTRAST TECHNIQUE: Contiguous axial images were obtained from the base of the skull through the vertex without intravenous contrast. RADIATION DOSE REDUCTION: This exam was performed according to the departmental dose-optimization program which includes automated exposure control, adjustment of  the mA and/or kV according to patient size and/or use of iterative reconstruction technique. COMPARISON:  04/15/2023 FINDINGS: Brain: Inferior right frontal pole encephalomalacia. Mild cerebellar volume loss, unchanged. No mass, hemorrhage or extra-axial collection. Vascular: No hyperdense vessel or unexpected vascular calcification. Skull: The visualized skull base, calvarium and extracranial soft tissues are normal. Sinuses/Orbits: No fluid levels or advanced mucosal thickening of the visualized paranasal sinuses. No mastoid or middle ear effusion. Normal orbits. Other: None. IMPRESSION: 1. No acute intracranial abnormality. 2. Inferior right frontal pole encephalomalacia. 3. Mild cerebellar volume loss, unchanged. Electronically Signed   By: Franky Stanford M.D.   On: 11/22/2023 00:23   DG Chest Port 1 View Result Date: 11/21/2023 CLINICAL DATA:  Cough and weakness EXAM: PORTABLE CHEST 1 VIEW COMPARISON:  11/20/2023 FINDINGS: Right shoulder replacement. Patchy bibasilar airspace opacities. Stable cardiomediastinal silhouette with aortic atherosclerosis. No pleural effusion or pneumothorax IMPRESSION:  Patchy bibasilar airspace opacities, possible pneumonia. Electronically Signed   By: Luke Bun M.D.   On: 11/21/2023 22:26        Scheduled Meds:  atorvastatin   20 mg Oral QHS   azithromycin   500 mg Oral QHS   clonazePAM   0.5 mg Oral BID   divalproex   1,000 mg Oral Daily   enoxaparin  (LOVENOX ) injection  40 mg Subcutaneous Q24H   guaiFENesin   600 mg Oral BID   ipratropium-albuterol   3 mL Nebulization BID   lacosamide   200 mg Oral BID   pantoprazole   40 mg Oral Daily   sodium chloride  flush  3 mL Intravenous Q12H   Continuous Infusions:  ampicillin -sulbactam (UNASYN ) IV 3 g (11/23/23 0933)     LOS: 1 day    Time spent: 35 minutes    Ladasha Schnackenberg A Fantashia Shupert, MD [facility]   If 7PM-7AM, please contact night-coverage [email or web address]  11/23/2023, 12:31 PM

## 2023-11-23 NOTE — Plan of Care (Signed)
  Problem: Education: Goal: Knowledge of General Education information will improve Description: Including pain rating scale, medication(s)/side effects and non-pharmacologic comfort measures Outcome: Adequate for Discharge   Problem: Clinical Measurements: Goal: Ability to maintain clinical measurements within normal limits will improve Outcome: Adequate for Discharge Goal: Diagnostic test results will improve Outcome: Adequate for Discharge

## 2023-11-23 NOTE — Progress Notes (Signed)
 Received order for swallow evaluation for this patient. Called RN to check in about patient tolerance of diet and RN reports patient has been eating and drinking well taking pills without coughing or any observed difficulty. Onsite SLP will follow up tomorrow for clinical swallow evaluation.  Consuelo Marshe Shrestha MA CCC-SLP

## 2023-11-23 NOTE — Plan of Care (Signed)
  Problem: Clinical Measurements: Goal: Will remain free from infection Outcome: Progressing Goal: Diagnostic test results will improve Outcome: Progressing Goal: Respiratory complications will improve Outcome: Progressing Goal: Cardiovascular complication will be avoided Outcome: Progressing   Problem: Activity: Goal: Risk for activity intolerance will decrease Outcome: Progressing   Problem: Elimination: Goal: Will not experience complications related to urinary retention Outcome: Progressing   Problem: Pain Managment: Goal: General experience of comfort will improve and/or be controlled Outcome: Progressing

## 2023-11-23 NOTE — Progress Notes (Signed)
 Pt has yet to express a bm. Gave pt prn Miralax .

## 2023-11-24 DIAGNOSIS — J189 Pneumonia, unspecified organism: Secondary | ICD-10-CM | POA: Diagnosis not present

## 2023-11-24 LAB — BASIC METABOLIC PANEL WITH GFR
Anion gap: 11 (ref 5–15)
BUN: 13 mg/dL (ref 8–23)
CO2: 24 mmol/L (ref 22–32)
Calcium: 9.2 mg/dL (ref 8.9–10.3)
Chloride: 106 mmol/L (ref 98–111)
Creatinine, Ser: 0.68 mg/dL (ref 0.44–1.00)
GFR, Estimated: >60 mL/min (ref >60)
Glucose, Bld: 117 mg/dL — ABNORMAL HIGH (ref 70–99)
Potassium: 3.6 mmol/L (ref 3.5–5.1)
Sodium: 141 mmol/L (ref 135–145)

## 2023-11-24 LAB — IRON AND TIBC
Iron: 62 ug/dL (ref 28–170)
Saturation Ratios: 23 % (ref 10.4–31.8)
TIBC: 268 ug/dL (ref 250–450)
UIBC: 206 ug/dL

## 2023-11-24 LAB — CBC
HCT: 31.1 % — ABNORMAL LOW (ref 36.0–46.0)
Hemoglobin: 10.3 g/dL — ABNORMAL LOW (ref 12.0–15.0)
MCH: 30.6 pg (ref 26.0–34.0)
MCHC: 33.1 g/dL (ref 30.0–36.0)
MCV: 92.3 fL (ref 80.0–100.0)
Platelets: 145 10*3/uL — ABNORMAL LOW (ref 150–400)
RBC: 3.37 MIL/uL — ABNORMAL LOW (ref 3.87–5.11)
RDW: 14.4 % (ref 11.5–15.5)
WBC: 5.4 10*3/uL (ref 4.0–10.5)
nRBC: 0 % (ref 0.0–0.2)

## 2023-11-24 LAB — RETICULOCYTES
Immature Retic Fract: 21.7 % — ABNORMAL HIGH (ref 2.3–15.9)
RBC.: 3.33 MIL/uL — ABNORMAL LOW (ref 3.87–5.11)
Retic Count, Absolute: 47.6 10*3/uL (ref 19.0–186.0)
Retic Ct Pct: 1.4 % (ref 0.4–3.1)

## 2023-11-24 LAB — FERRITIN: Ferritin: 213 ng/mL (ref 11–307)

## 2023-11-24 LAB — FOLATE: Folate: 15.7 ng/mL (ref >5.9)

## 2023-11-24 MED ORDER — AMOXICILLIN-POT CLAVULANATE 875-125 MG PO TABS: 1.0000 | ORAL_TABLET | Freq: Two times a day (BID) | ORAL | 0 refills | Status: AC

## 2023-11-24 MED ORDER — AZITHROMYCIN 500 MG PO TABS: 500.0000 mg | ORAL_TABLET | Freq: Every day | ORAL | 0 refills | Status: AC

## 2023-11-24 MED ORDER — GUAIFENESIN-DM 100-10 MG/5ML PO SYRP: 5.0000 mL | ORAL_SOLUTION | ORAL | 0 refills | Status: DC | PRN

## 2023-11-24 NOTE — Evaluation (Signed)
 Clinical/Bedside Swallow Evaluation Patient Details  Name: Maria [name] MRN: [id number] Date of Birth: 1960-11-15  Today's Date: 11/24/2023 Time: SLP Start Time (ACUTE ONLY): 1020 SLP Stop Time (ACUTE ONLY): 1033 SLP Time Calculation (min) (ACUTE ONLY): 13 min  Past Medical History:  Past Medical History:  Diagnosis Date   Constipation    Dysphagia    Fracture    R foot   GERD (gastroesophageal reflux disease)    Headache    History of shingles 10/2016   Mental retardation    lesion in head   Pneumonia    Seizures (HCC)    Last seizure 05/31/23   Thrombocytopenia (HCC)    Tremor    Past Surgical History:  Past Surgical History:  Procedure Laterality Date   BIOPSY  09/16/2014   Procedure: BIOPSY;  Surgeon: Claudis RAYMOND Rivet, MD;  Location: [facility];  Service: Endoscopy;;   CATARACT EXTRACTION     both eyes, May of 2015   COLONOSCOPY     COLONOSCOPY WITH PROPOFOL  N/A 11/20/2018   Procedure: COLONOSCOPY WITH PROPOFOL ;  Surgeon: Rivet Claudis RAYMOND, MD;  Location: [facility];  Service: Endoscopy;  Laterality: N/A;   COLONOSCOPY WITH PROPOFOL  N/A 08/07/2022   Procedure: COLONOSCOPY WITH PROPOFOL ;  Surgeon: Eartha Angelia Sieving, MD;  Location: [facility];  Service: Gastroenterology;  Laterality: N/A;  7:30am, asa 1-2   ESOPHAGEAL DILATION N/A 09/16/2014   Procedure: ESOPHAGEAL DILATION WITH 54FR MALONEY DILATOR;  Surgeon: Claudis RAYMOND Rivet, MD;  Location: [facility];  Service: Endoscopy;  Laterality: N/A;   ESOPHAGOGASTRODUODENOSCOPY (EGD) WITH PROPOFOL  N/A 09/16/2014   Procedure: ESOPHAGOGASTRODUODENOSCOPY (EGD) WITH PROPOFOL ;  Surgeon: Claudis RAYMOND Rivet, MD;  Location: [facility];  Service: Endoscopy;  Laterality: N/A;   HEMOSTASIS CLIP PLACEMENT  08/07/2022   Procedure: HEMOSTASIS CLIP PLACEMENT;  Surgeon: Eartha Angelia Sieving, MD;  Location: [facility];  Service: Gastroenterology;;   LAPAROSCOPIC APPENDECTOMY N/A 07/14/2020   Procedure: APPENDECTOMY LAPAROSCOPIC;  Surgeon:  Kallie Manuelita BROCKS, MD;  Location: [facility];  Service: General;  Laterality: N/A;   MOUTH SURGERY     POLYPECTOMY  11/20/2018   Procedure: POLYPECTOMY;  Surgeon: Rivet Claudis RAYMOND, MD;  Location: [facility];  Service: Endoscopy;;  colon   POLYPECTOMY  08/07/2022   Procedure: POLYPECTOMY;  Surgeon: Eartha Angelia Sieving, MD;  Location: [facility];  Service: Gastroenterology;;   REVERSE SHOULDER ARTHROPLASTY Right 06/12/2023   Procedure: REVERSE SHOULDER ARTHROPLASTY;  Surgeon: Cristy Bonner DASEN, MD;  Location: [facility];  Service: Orthopedics;  Laterality: Right;   Skin graft to gum Right 08/2013   SUBMUCOSAL LIFTING INJECTION  08/07/2022   Procedure: SUBMUCOSAL LIFTING INJECTION;  Surgeon: Eartha Angelia, Sieving, MD;  Location: [facility];  Service: Gastroenterology;;   TONSILLECTOMY AND ADENOIDECTOMY     TOTAL ABDOMINAL HYSTERECTOMY     HPI:  Pt is a 63 y.o. female with hx of seizure disorder, EGD with dilation (2016), GERD, dysphagia, intellectual disability, who was brought in by family with symptoms of cough, fever, increased somnolence. Pt known to ST service - received cog/ling and dysphagia tx during [facility] admission in 2021. D/c'd on reg/thin at that time. BSE ordered as RN noted coughing with liquids.    Assessment / Plan / Recommendation  Clinical Impression  Pt seen for bedside swallow eval with direct handoff from MD who reports she has some difficulty with taking pills whole. RN reports pt is able to navigate them when given 1 at a time. Oral mech was  unremarkable. Voice was noted to be horse, which pt states is new. Pt noted to have a cough at baseline in absence of PO. Pt denied globus sensation, odynophagia, or difficulty with PO intake.  Pt overfilled her mouth with solids but mastication of regular texture graham cracker was adequate and swallow was timely. Mild lingual residue observed but cleared with subsequent swallow. No overt s/sx of aspiration observed with thorough challenging  of ice via spoon or thin liquids via spoon/cup/straw.  Pt [facility] with [facility] oropharyngeal function with no clinical s/sx of aspiration observed across consistencies trialed. Recommend continue regular with thin liquids and meds whole in liquids (give 1 at a time). Discussed with pt to reduce rate of eating and she verbalized understanding; however, baseline intellectual disability may impact her implementing this strategy. She would benefit from intermittent cueing to reduce rate during meals. No further ST services warranted at this time.   SLP Visit Diagnosis: Dysphagia, unspecified (R13.10)    Aspiration Risk  No limitations    Diet Recommendation Regular;Thin liquid    Liquid Administration via: Straw;Cup Medication Administration: Whole meds with liquid Supervision: Patient able to self feed Compensations: Minimize environmental distractions;Slow rate;Small sips/bites Postural Changes: Seated upright at 90 degrees;Remain upright for at least 30 minutes after po intake    Other  Recommendations Oral Care Recommendations: Oral care BID     Assistance Recommended at Discharge  No ST services needed at d/c. Pt may benefit from cues to reduce rate of eating during meals.   Functional Status Assessment Patient has had a recent decline in their functional status and demonstrates the ability to make significant improvements in function in a reasonable and predictable amount of time.    Swallow Study   General HPI: 63 y.o. female with hx of seizure disorder, EGD with dilation (2016), GERD, dysphagia, intellectual disability, who was brought in by family with symptoms of cough, fever, increased somnolence. Pt known to ST service - received cog/ling and dysphagia tx during [facility] admission in 2021. D/c'd on reg/thin at that time. BSE ordered as RN noted coughing with liquids. Type of Study: Bedside Swallow Evaluation Previous Swallow Assessment: yes Diet Prior to this Study: Regular;Thin liquids  (Level 0) Temperature Spikes Noted: No Respiratory Status: Room air History of Recent Intubation: No Behavior/Cognition: Alert;Confused;Cooperative Oral Cavity Assessment: Within Functional Limits Oral Care Completed by SLP: No Oral Cavity - Dentition: Adequate natural dentition Vision: Functional for self-feeding Self-Feeding Abilities: Able to feed self Patient Positioning: Upright in bed Baseline Vocal Quality: Hoarse Volitional Cough: Strong Volitional Swallow: Able to elicit    Oral/Motor/Sensory Function Overall Oral Motor/Sensory Function: Within functional limits   Ice Chips Ice chips: Within functional limits Presentation: Spoon   Thin Liquid Thin Liquid: Within functional limits Presentation: Cup;Self Fed;Spoon;Straw    Nectar Thick Nectar Thick Liquid: Not tested   Honey Thick Honey Thick Liquid: Not tested   Puree Puree: Not tested   Solid     Solid: Within functional limits Presentation: Self Fed      Waddell JONETTA Novak, MA CCC-SLP 11/24/2023,12:48 PM

## 2023-11-24 NOTE — Care Management Important Message (Signed)
 Important Message  Patient Details  Name: Maria [name] MRN: [id number] Date of Birth: 04-06-1961   Important Message Given:  N/A - LOS <3 / Initial given by admissions     Duwaine LITTIE Ada 11/24/2023, 1:12 PM

## 2023-11-24 NOTE — Progress Notes (Signed)
 Discharge instructions provided to patient and sister. Verbalizes understanding of the information that was presented. Discharged home with sister.

## 2023-11-24 NOTE — Discharge Summary (Signed)
 Physician Discharge Summary   Patient: Maria [name] MRN: [id number] DOB: 25-Oct-1960  Admit date:     11/21/2023  Discharge date: {dischdate:26783}  Discharge Physician: Bernardino KATHEE Come   PCP: [name], MD   Recommendations at discharge:  {Tip this will not be part of the note when signed- Example include specific recommendations for outpatient follow-up, pending tests to follow-up on. (Optional):26781}  ***  Discharge Diagnoses: Principal Problem:   CAP (community acquired pneumonia)  Hospital Course: 63 year old with past medical history significant for seizure disorder, heart failure preserved ejection fraction, GERD, dysphagia, intellectual disability who was brought by family due to cough, fever, increased somnolence.  Over the last few days patient has been having worsening productive cough and shortness of breath and getting more sleepy.  Patient was found to have pneumonia.  ***  Assessment and Plan: Community-acquired pneumonia Patient presented with viral-like type syndrome 62 days prior to admission seen in the ED and discharged with presumed viral syndrome (rhinovirus positive. ) - Presents with worsening shortness of breath and cough.  Chest x-ray showed developing patchy bibasilar infiltrates. - Speech therapy evaluation pending.  - Continue with IV Unasyn  and azithromycin  for superimpose bacterial PNA - Follow sputum culture - Continue albuterol  -schedule Guaifenesin  and Duonebs.    Acute metabolic encephalopathy in the setting of pneumonia patient at baseline is alert, has some cognitive deficits, but she has been more lethargic In the setting of infection Ammonia: ***TSH: 0.5 B12 765   Chronic Petechial rash: Upper extremity more than lower extremity.  This is a chronic rash unchanged for years Need to follow-up with dermatologist   Seizure disorder: Continue home valproate, lacosamide , clonazepam  twice daily   HFpEF, without acute exacerbation: Not on  diuretics at home Anemia; Monitor hb. No evidence of active bleeding.  Previous Hb at 10.  Check anemia panel in am.    HLD: Continue home atorvastatin    GERD, dysphagia: = PPI   ? OAB: Hold home vibegron  while inpatient   Intellectual disability: Noted, baseline is oriented, conversant, independent with ADLs.  Consultants: *** Procedures performed: ***  Disposition: {Plan; Disposition:26390} Diet recommendation:  {Diet_Plan:26776} DISCHARGE MEDICATION: Allergies as of 11/24/2023       Reactions   Codeine Nausea And Vomiting   Lamictal [lamotrigine] Rash     Med Rec must be completed prior to using this [facility]***       Discharge Exam: Filed Weights   11/21/23 1934  Weight: 60.8 kg   ***  Condition at discharge: {DC Condition:26389}  The results of significant diagnostics from this hospitalization (including imaging, microbiology, ancillary and laboratory) are listed below for reference.   Imaging Studies: CT HEAD WO CONTRAST ( ) Result Date: 11/22/2023 CLINICAL DATA:  Altered mental status EXAM: CT HEAD WITHOUT CONTRAST TECHNIQUE: Contiguous axial images were obtained from the base of the skull through the vertex without intravenous contrast. RADIATION DOSE REDUCTION: This exam was performed according to the departmental dose-optimization program which includes automated exposure control, adjustment of the mA and/or kV according to patient size and/or use of iterative reconstruction technique. COMPARISON:  04/15/2023 FINDINGS: Brain: Inferior right frontal pole encephalomalacia. Mild cerebellar volume loss, unchanged. No mass, hemorrhage or extra-axial collection. Vascular: No hyperdense vessel or unexpected vascular calcification. Skull: The visualized skull base, calvarium and extracranial soft tissues are normal. Sinuses/Orbits: No fluid levels or advanced mucosal thickening of the visualized paranasal sinuses. No mastoid or middle ear effusion. Normal orbits.  Other: None. IMPRESSION: 1. No acute intracranial abnormality.  2. Inferior right frontal pole encephalomalacia. 3. Mild cerebellar volume loss, unchanged. Electronically Signed   By: Franky Stanford M.D.   On: 11/22/2023 00:23   DG Chest Port 1 View Result Date: 11/21/2023 CLINICAL DATA:  Cough and weakness EXAM: PORTABLE CHEST 1 VIEW COMPARISON:  11/20/2023 FINDINGS: Right shoulder replacement. Patchy bibasilar airspace opacities. Stable cardiomediastinal silhouette with aortic atherosclerosis. No pleural effusion or pneumothorax IMPRESSION: Patchy bibasilar airspace opacities, possible pneumonia. Electronically Signed   By: Luke Bun M.D.   On: 11/21/2023 22:26   DG Chest Port 1 View Result Date: 11/20/2023 CLINICAL DATA:  Cough with fever, nausea and loss of appetite. EXAM: PORTABLE CHEST 1 VIEW COMPARISON:  June 16, 2023 FINDINGS: The heart size and mediastinal contours are within normal limits. There is no evidence of acute infiltrate, pleural effusion or pneumothorax. Evidence of prior right shoulder arthroplasty is noted. Multilevel degenerative changes seen throughout the thoracic spine. IMPRESSION: No active cardiopulmonary disease. Electronically Signed   By: Suzen Dials M.D.   On: 11/20/2023 23:50    Microbiology: Results for orders placed or performed during the hospital encounter of 11/21/23  Respiratory (~20 pathogens) panel by PCR     Status: Abnormal   Collection Time: 11/22/23  7:46 AM   Specimen: Nasopharyngeal Swab; Respiratory  Result Value Ref Range Status   Adenovirus NOT DETECTED NOT DETECTED Final   Coronavirus 229E NOT DETECTED NOT DETECTED Final    Comment: (NOTE) The Coronavirus on the Respiratory Panel, DOES NOT test for the novel  Coronavirus (2019 nCoV)    Coronavirus HKU1 NOT DETECTED NOT DETECTED Final   Coronavirus NL63 NOT DETECTED NOT DETECTED Final   Coronavirus OC43 NOT DETECTED NOT DETECTED Final   Metapneumovirus NOT DETECTED NOT DETECTED  Final   Rhinovirus / Enterovirus DETECTED (A) NOT DETECTED Final   Influenza A NOT DETECTED NOT DETECTED Final   Influenza B NOT DETECTED NOT DETECTED Final   Parainfluenza Virus 1 NOT DETECTED NOT DETECTED Final   Parainfluenza Virus 2 NOT DETECTED NOT DETECTED Final   Parainfluenza Virus 3 NOT DETECTED NOT DETECTED Final   Parainfluenza Virus 4 NOT DETECTED NOT DETECTED Final   Respiratory Syncytial Virus NOT DETECTED NOT DETECTED Final   Bordetella pertussis NOT DETECTED NOT DETECTED Final   Bordetella Parapertussis NOT DETECTED NOT DETECTED Final   Chlamydophila pneumoniae NOT DETECTED NOT DETECTED Final   Mycoplasma pneumoniae NOT DETECTED NOT DETECTED Final    Comment: Performed at [facility], [address] [place], [place] [address]    Labs: CBC: Recent Labs  Lab 11/20/23 2339 11/21/23 2012 11/22/23 0404 11/23/23 0824 11/24/23 0417  WBC 12.3* 9.6 7.4 5.6 5.4  NEUTROABS 9.8*  --   --   --   --   HGB 12.3 12.7 10.5* 9.8* 10.3*  HCT 35.3* 38.0 31.8* 29.4* 31.1*  MCV 90.5 92.0 94.4 91.6 92.3  PLT 128* 134* 121* 106* 145*   Basic Metabolic Panel: Recent Labs  Lab 11/20/23 2339 11/21/23 2012 11/22/23 0404 11/23/23 0824 11/24/23 0417  NA 139 139 140 139 141  K 4.3 4.0 3.6 3.4* 3.6  CL 105 103 109 106 106  CO2 23 23 24 22 24   GLUCOSE 105* 123* 96 112* 117*  BUN 18 17 16 13 13   CREATININE 0.84 1.03* 0.82 0.71 0.68  CALCIUM  9.5 9.5 8.4* 8.7* 9.2  MG  --   --  1.8  --   --   PHOS  --   --  2.6  --   --    Liver Function Tests: Recent Labs  Lab 11/20/23 2339  AST 18  ALT 18  ALKPHOS 76  BILITOT 0.8  PROT 6.6  ALBUMIN  3.8   CBG: No results for input(s): GLUCAP in the last 168 hours.  Discharge time spent: {LESS THAN/GREATER UYJW:73611} 30 minutes.  Signed: Bernardino KATHEE Come, MD [facility] 11/24/2023

## 2023-11-25 ENCOUNTER — Telehealth: Payer: Self-pay

## 2023-11-25 NOTE — Patient Instructions (Signed)
 Visit Information  Thank you for taking time to visit with me today. Please don't hesitate to contact me if I can be of assistance to you before our next scheduled telephone appointment.  Our next appointment is by telephone on 12/02/23 at 130 pm  Following is a copy of your care plan:   Goals Addressed             This Visit's Progress    VBCI Transitions of Care (TOC) Care Plan       Problems:  Recent Hospitalization for treatment of Community Acquired pneumonia Knowledge Deficit Related to pneumonia    11/25/23 Patient with known intellectual dsablity. Spoke with Brother/caregiver [name]. Patient lives in the home with brother and sister who care for her.  She is independent with care. Family manages medications.    Reviewed pneumonia and importance of PCP follow up to reevaluate pneumonia status.  He verbalized understanding.  Brother [name] to call Dr. Milford office for follow up appointment.  Brother [name] shares that patient has been sleeping and lying around more.  Advised this should improve as she feels better. He verbalized understanding.   Goal:  Over the next 30 days, the patient will not experience hospital readmission  Interventions:  Transitions of Care: Doctor Visits  - discussed the importance of doctor visits Reviewed Signs and symptoms of infection  Pneumonia: Contact physician if: You have a new or higher fever. You are coughing more deeply or more often. You are not getting better after 2 days (48 hours). You do not get better as expected         Patient Self Care Activities:  Call provider office for new concerns or questions  Notify RN Care Manager of [facility] call rescheduling needs Take medications as prescribed    Plan:  Telephone follow up appointment with care management team member scheduled for:  12/02/23 @ 1:30 pm        Patient verbalizes understanding of instructions and care plan provided today and agrees to view in MyChart. Active MyChart  status and patient understanding of how to access instructions and care plan via MyChart confirmed with patient.     The patient has been provided with contact information for the care management team and has been advised to call with any health related questions or concerns.   Please call the care guide team at [phone] if you need to cancel or reschedule your appointment.   Please call the Suicide and Crisis Lifeline: 988 if you are experiencing a Mental Health or Behavioral Health Crisis or need someone to talk to.  Roxy Mastandrea J. Kaysa Roulhac RN, MSN [facility], [facility] RN Care Manager Direct Dial: [phone]  Fax: [phone] Website: [email or web address]

## 2023-11-26 DIAGNOSIS — I110 Hypertensive heart disease with heart failure: Secondary | ICD-10-CM | POA: Diagnosis not present

## 2023-11-26 DIAGNOSIS — D696 Thrombocytopenia, unspecified: Secondary | ICD-10-CM | POA: Diagnosis not present

## 2023-11-26 DIAGNOSIS — S42201D Unspecified fracture of upper end of right humerus, subsequent encounter for fracture with routine healing: Secondary | ICD-10-CM | POA: Diagnosis not present

## 2023-11-26 DIAGNOSIS — K5900 Constipation, unspecified: Secondary | ICD-10-CM | POA: Diagnosis not present

## 2023-11-26 DIAGNOSIS — I509 Heart failure, unspecified: Secondary | ICD-10-CM | POA: Diagnosis not present

## 2023-11-26 DIAGNOSIS — G40909 Epilepsy, unspecified, not intractable, without status epilepticus: Secondary | ICD-10-CM | POA: Diagnosis not present

## 2023-11-28 DIAGNOSIS — B001 Herpesviral vesicular dermatitis: Secondary | ICD-10-CM | POA: Insufficient documentation

## 2023-11-28 DIAGNOSIS — Z09 Encounter for follow-up examination after completed treatment for conditions other than malignant neoplasm: Secondary | ICD-10-CM | POA: Diagnosis not present

## 2023-11-28 HISTORY — DX: Herpesviral vesicular dermatitis: B00.1

## 2023-12-02 ENCOUNTER — Other Ambulatory Visit: Payer: Self-pay

## 2023-12-02 DIAGNOSIS — S42201D Unspecified fracture of upper end of right humerus, subsequent encounter for fracture with routine healing: Secondary | ICD-10-CM | POA: Diagnosis not present

## 2023-12-02 DIAGNOSIS — G40909 Epilepsy, unspecified, not intractable, without status epilepticus: Secondary | ICD-10-CM | POA: Diagnosis not present

## 2023-12-02 DIAGNOSIS — I110 Hypertensive heart disease with heart failure: Secondary | ICD-10-CM | POA: Diagnosis not present

## 2023-12-02 DIAGNOSIS — K5900 Constipation, unspecified: Secondary | ICD-10-CM | POA: Diagnosis not present

## 2023-12-02 DIAGNOSIS — D696 Thrombocytopenia, unspecified: Secondary | ICD-10-CM | POA: Diagnosis not present

## 2023-12-02 DIAGNOSIS — I509 Heart failure, unspecified: Secondary | ICD-10-CM | POA: Diagnosis not present

## 2023-12-02 NOTE — Patient Instructions (Signed)
 Visit Information  Thank you for taking time to visit with me today. Please don't hesitate to contact me if I can be of assistance to you before our next scheduled telephone appointment.  Our next appointment is by telephone on 12/09/23 at 130 pm  Following is a copy of your care plan:   Goals Addressed             This Visit's Progress    VBCI Transitions of Care (TOC) Care Plan       Problems:  Recent Hospitalization for treatment of Community Acquired pneumonia Knowledge Deficit Related to pneumonia    12/02/23  Spoke with patient and brother today. Patient able to reports she feels much better.  Saw PCP on 6/27 with no changes. No changes with medications.  Reiterated pneumonia and notifying physician for changes.     Goal:  Over the next 30 days, the patient will not experience hospital readmission  Interventions:  Transitions of Care: Reviewed Signs and symptoms of infection  Pneumonia: Contact physician if: You have a new or higher fever. You are coughing more deeply or more often. You are not getting better after 2 days (48 hours). You do not get better as expected         Patient Self Care Activities:  Call provider office for new concerns or questions  Notify RN Care Manager of [facility] call rescheduling needs Take medications as prescribed    Plan:  Telephone follow up appointment with care management team member scheduled for:  12/09/23 @ 1:30 pm        Patient verbalizes understanding of instructions and care plan provided today and agrees to view in MyChart. Active MyChart status and patient understanding of how to access instructions and care plan via MyChart confirmed with patient.     The patient has been provided with contact information for the care management team and has been advised to call with any health related questions or concerns.   Please call the care guide team at [phone] if you need to cancel or reschedule your appointment.   Please call  the Suicide and Crisis Lifeline: 988 if you are experiencing a Mental Health or Behavioral Health Crisis or need someone to talk to.  Jeven Topper J. Kevionna Heffler RN, MSN [facility], [facility] RN Care Manager Direct Dial: [phone]  Fax: [phone] Website: [email or web address]

## 2023-12-02 NOTE — Transitions of Care (Post Inpatient/ED Visit) (Signed)
 Transition of Care week 2  Visit Note  12/02/2023  Name: Maria [name] MRN: [id number]          DOB: 26-Dec-1960  Situation: Patient enrolled in [facility] 30-day program. Visit completed with patient and brother [name] by telephone.   Background:   Initial Transition Care Management Follow-up Telephone Call    Past Medical History:  Diagnosis Date   Constipation    Dysphagia    Fracture    R foot   GERD (gastroesophageal reflux disease)    Headache    History of shingles 10/2016   Mental retardation    lesion in head   Pneumonia    Seizures (HCC)    Last seizure 05/31/23   Thrombocytopenia (HCC)    Tremor     Assessment: Patient Reported Symptoms: Cognitive Cognitive Status: Alert and oriented to person, place, and time (patient able to carry  conversation) Cognitive/Intellectual Conditions Management [RPT]: Intellectual Disability      Neurological Neurological Review of Symptoms: No symptoms reported    HEENT HEENT Symptoms Reported: No symptoms reported      Cardiovascular Cardiovascular Symptoms Reported: No symptoms reported    Respiratory Respiratory Symptoms Reported: No symptoms reported Additional Respiratory Details: Patient reports she feels much better    Endocrine Endocrine Symptoms Reported: No symptoms reported    Gastrointestinal Gastrointestinal Symptoms Reported: No symptoms reported      Genitourinary Genitourinary Symptoms Reported: No symptoms reported    Integumentary Integumentary Symptoms Reported: No symptoms reported    Musculoskeletal Musculoskelatal Symptoms Reviewed: No symptoms reported        Psychosocial Psychosocial Symptoms Reported: No symptoms reported         There were no vitals filed for this visit.  Medications Reviewed Today     Reviewed by Kyleigh Nannini, RN (Case Manager) on 12/02/23 at 1330  Med List Status: <None>   Medication Order Taking? Sig Documenting Provider Last Dose Status Informant  aspirin  EC 81  MG tablet [id number] Yes Take 81 mg by mouth daily. Swallow whole. [provider]  Active Family Member, Pharmacy Records  atorvastatin  (LIPITOR) 20 MG tablet [id number] Yes Take 20 mg by mouth at bedtime. [provider]  Active Family Member, Pharmacy Records  benzonatate  (TESSALON ) 100 MG capsule [id number]  Take 1 capsule (100 mg total) by mouth every 8 (eight) hours.  Patient not taking: Reported on 12/02/2023   Emil Share, DO  Active Family Member, Pharmacy Records  Calcium  Carb-Cholecalciferol (CALCIUM /VITAMIN D  PO) [id number] Yes Take 1 tablet by mouth 2 (two) times daily. 600 mg / 800 units [provider]  Active Family Member, Pharmacy Records  calcium  carbonate (TUMS - DOSED IN MG ELEMENTAL CALCIUM ) 500 MG chewable tablet [id number] Yes Chew 1 tablet by mouth daily as needed for indigestion or heartburn. [provider]  Active Family Member, Pharmacy Records  cholecalciferol (VITAMIN D3) 25 MCG (1000 UNIT) tablet [id number] Yes Take 1,000 Units by mouth 2 (two) times daily. [provider]  Active Family Member, Pharmacy Records  clonazePAM  (KLONOPIN ) 0.5 MG tablet [id number] Yes Take 1 tablet (0.5 mg total) by mouth 2 (two) times daily. Gayland Lauraine PARAS, NP  Active Family Member, Pharmacy Records  divalproex  (DEPAKOTE ) 500 MG DR tablet [id number] Yes Take 2 tablets (1,000 mg total) by mouth daily. Gayland Lauraine PARAS, NP  Active Family Member, Pharmacy Records  guaiFENesin -dextromethorphan  [facility] DM) 100-10 MG/5ML syrup [id number] Yes Take 5 mLs by mouth every 4 (four) hours as needed for cough. Bryn,  Bernardino NOVAK, MD  Active   lacosamide  (VIMPAT ) 200 MG TABS tablet [id number] Yes Take 1 tablet (200 mg total) by mouth 2 (two) times daily. Gayland Lauraine PARAS, NP  Active Family Member, Pharmacy Records  Multiple Vitamin (MULTIVITAMIN) capsule [id number] Yes Take 1 capsule by mouth daily. [provider]  Active Family Member, Pharmacy Records  omeprazole   [facility]) 40 MG capsule [id number] Yes TAKE 1 CAPSULE DAILY (NEED OFFICE VISIT) Eartha Angelia Sieving, MD  Active Family Member, Pharmacy Records    Discontinued 06/06/20 1731   senna (SENOKOT) 8.6 MG tablet [id number] Yes Take 2 tablets by mouth daily. [provider]  Active Family Member, Pharmacy Records  Vibegron  (GEMTESA ) 75 MG TABS [id number] Yes Take 1 tablet (75 mg total) by mouth daily. Matilda Senior, MD  Active Family Member, Pharmacy Records  Med List Note Lauralyn Neth, CPhT 06/07/20 9042): Ekta, Dancer (Mother) [phone] or [phone] handles pt's meds.            Goals Addressed             This Visit's Progress    VBCI Transitions of Care (TOC) Care Plan       Problems:  Recent Hospitalization for treatment of Community Acquired pneumonia Knowledge Deficit Related to pneumonia    12/02/23  Spoke with patient and brother today. Patient able to reports she feels much better.  Saw PCP on 6/27 with no changes. No changes with medications.  Reiterated pneumonia and notifying physician for changes.     Goal:  Over the next 30 days, the patient will not experience hospital readmission  Interventions:  Transitions of Care: Reviewed Signs and symptoms of infection  Pneumonia: Contact physician if: You have a new or higher fever. You are coughing more deeply or more often. You are not getting better after 2 days (48 hours). You do not get better as expected         Patient Self Care Activities:  Call provider office for new concerns or questions  Notify RN Care Manager of [facility] call rescheduling needs Take medications as prescribed    Plan:  Telephone follow up appointment with care management team member scheduled for:  12/09/23 @ 1:30 pm        Recommendation:   Continue Current Plan of Care  Follow Up Plan:   Telephone follow-up in 1 week  Larissa Pegg J. Daquann Merriott RN, MSN [facility], [facility] RN Care  Manager Direct Dial: [phone]  Fax: [phone] Website: [email or web address]

## 2023-12-09 ENCOUNTER — Other Ambulatory Visit: Payer: Self-pay

## 2023-12-09 NOTE — Patient Instructions (Signed)
 Visit Information  Thank you for taking time to visit with me today. Please don't hesitate to contact me if I can be of assistance to you before our next scheduled telephone appointment.  Our next appointment is by telephone on 12/16/23 at 130 pm  Following is a copy of your care plan:   Goals Addressed             This Visit's Progress    VBCI Transitions of Care (TOC) Care Plan       Problems:  Recent Hospitalization for treatment of Community Acquired pneumonia Knowledge Deficit Related to pneumonia    12/1823  Spoke with patient and brother today. Brother reports patient doing good.  No problems.  No changes with medications.  Reinforced  pneumonia and notifying physician for changes.     Goal:  Over the next 30 days, the patient will not experience hospital readmission  Interventions:  Transitions of Care: Reviewed Signs and symptoms of infection  Pneumonia: Contact physician if: You have a new or higher fever. You are coughing more deeply or more often. You are not getting better after 2 days (48 hours). You do not get better as expected         Patient Self Care Activities:  Call provider office for new concerns or questions  Notify RN Care Manager of [facility] call rescheduling needs Take medications as prescribed    Plan:  Telephone follow up appointment with care management team member scheduled for:  12/09/23 @ 1:30 pm        Patient verbalizes understanding of instructions and care plan provided today and agrees to view in MyChart. Active MyChart status and patient understanding of how to access instructions and care plan via MyChart confirmed with patient.     The patient has been provided with contact information for the care management team and has been advised to call with any health related questions or concerns.   Please call the care guide team at [phone] if you need to cancel or reschedule your appointment.   Please call the Suicide and Crisis  Lifeline: 988 if you are experiencing a Mental Health or Behavioral Health Crisis or need someone to talk to.  Orel Cooler J. Aivah Putman RN, MSN [facility], [facility] RN Care Manager Direct Dial: [phone]  Fax: [phone] Website: [email or web address]

## 2023-12-09 NOTE — Transitions of Care (Post Inpatient/ED Visit) (Signed)
 Transition of Care week 3  Visit Note  12/09/2023  Name: Maria [name] MRN: [id number]          DOB: 1960-09-05  Situation: Patient enrolled in [facility] 30-day program. Visit completed with patient and brother Maria [name] by telephone.   Background:   Initial Transition Care Management Follow-up Telephone Call    Past Medical History:  Diagnosis Date   Constipation    Dysphagia    Fracture    R foot   GERD (gastroesophageal reflux disease)    Headache    History of shingles 10/2016   Mental retardation    lesion in head   Pneumonia    Seizures (HCC)    Last seizure 05/31/23   Thrombocytopenia (HCC)    Tremor     Assessment: Patient Reported Symptoms: Cognitive Cognitive Status: Alert and oriented to person, place, and time      Neurological Neurological Review of Symptoms: No symptoms reported    HEENT HEENT Symptoms Reported: No symptoms reported      Cardiovascular Cardiovascular Symptoms Reported: No symptoms reported    Respiratory Respiratory Symptoms Reported: No symptoms reported    Endocrine Endocrine Symptoms Reported: No symptoms reported    Gastrointestinal Gastrointestinal Symptoms Reported: No symptoms reported      Genitourinary Genitourinary Symptoms Reported: No symptoms reported    Integumentary Integumentary Symptoms Reported: No symptoms reported    Musculoskeletal Musculoskelatal Symptoms Reviewed: No symptoms reported        Psychosocial Psychosocial Symptoms Reported: No symptoms reported         There were no vitals filed for this visit.  Medications Reviewed Today     Reviewed by Nayali Talerico, RN (Case Manager) on 12/09/23 at 1339  Med List Status: <None>   Medication Order Taking? Sig Documenting Provider Last Dose Status Informant  aspirin  EC 81 MG tablet [id number] Yes Take 81 mg by mouth daily. Swallow whole. [provider]  Active Family Member, Pharmacy Records  atorvastatin  (LIPITOR) 20 MG tablet [id number] Yes  Take 20 mg by mouth at bedtime. [provider]  Active Family Member, Pharmacy Records  benzonatate  (TESSALON ) 100 MG capsule [id number]  Take 1 capsule (100 mg total) by mouth every 8 (eight) hours.  Patient not taking: Reported on 12/09/2023   Emil Share, DO  Active Family Member, Pharmacy Records  Calcium  Carb-Cholecalciferol (CALCIUM /VITAMIN D  PO) [id number] Yes Take 1 tablet by mouth 2 (two) times daily. 600 mg / 800 units [provider]  Active Family Member, Pharmacy Records  calcium  carbonate (TUMS - DOSED IN MG ELEMENTAL CALCIUM ) 500 MG chewable tablet [id number] Yes Chew 1 tablet by mouth daily as needed for indigestion or heartburn. [provider]  Active Family Member, Pharmacy Records  cholecalciferol (VITAMIN D3) 25 MCG (1000 UNIT) tablet [id number] Yes Take 1,000 Units by mouth 2 (two) times daily. [provider]  Active Family Member, Pharmacy Records  clonazePAM  (KLONOPIN ) 0.5 MG tablet [id number] Yes Take 1 tablet (0.5 mg total) by mouth 2 (two) times daily. Gayland Lauraine PARAS, NP  Active Family Member, Pharmacy Records  divalproex  (DEPAKOTE ) 500 MG DR tablet [id number] Yes Take 2 tablets (1,000 mg total) by mouth daily. Gayland Lauraine PARAS, NP  Active Family Member, Pharmacy Records  guaiFENesin -dextromethorphan  (ROBITUSSIN DM) 100-10 MG/5ML syrup [id number] Yes Take 5 mLs by mouth every 4 (four) hours as needed for cough. Bryn Bernardino NOVAK, MD  Active   lacosamide  (VIMPAT ) 200 MG TABS tablet [id number] Yes Take 1 tablet (200 mg total)  by mouth 2 (two) times daily. Gayland Lauraine PARAS, NP  Active Family Member, Pharmacy Records  Multiple Vitamin (MULTIVITAMIN) capsule [id number] Yes Take 1 capsule by mouth daily. [provider]  Active Family Member, Pharmacy Records  omeprazole  [facility]) 40 MG capsule [id number] Yes TAKE 1 CAPSULE DAILY (NEED OFFICE VISIT) Eartha Angelia Sieving, MD  Active Family Member, Pharmacy Records    Discontinued 06/06/20 1731   senna  (SENOKOT) 8.6 MG tablet [id number] Yes Take 2 tablets by mouth daily. [provider]  Active Family Member, Pharmacy Records  Vibegron  (GEMTESA ) 75 MG TABS [id number] Yes Take 1 tablet (75 mg total) by mouth daily. Matilda Senior, MD  Active Family Member, Pharmacy Records  Med List Note Lauralyn Neth, CPhT 06/07/20 [phone]): Asiana, Benninger (Mother) [phone] or [phone] handles pt's meds.            Goals Addressed             This Visit's Progress    VBCI Transitions of Care (TOC) Care Plan       Problems:  Recent Hospitalization for treatment of Community Acquired pneumonia Knowledge Deficit Related to pneumonia    12/1823  Spoke with patient and brother today. Brother reports patient doing good.  No problems.  No changes with medications.  Reinforced  pneumonia and notifying physician for changes.     Goal:  Over the next 30 days, the patient will not experience hospital readmission  Interventions:  Transitions of Care: Reviewed Signs and symptoms of infection  Pneumonia: Contact physician if: You have a new or higher fever. You are coughing more deeply or more often. You are not getting better after 2 days (48 hours). You do not get better as expected         Patient Self Care Activities:  Call provider office for new concerns or questions  Notify RN Care Manager of [facility] call rescheduling needs Take medications as prescribed    Plan:  Telephone follow up appointment with care management team member scheduled for:  12/09/23 @ 1:30 pm        Recommendation:   Continue Current Plan of Care  Follow Up Plan:   Telephone follow-up in 1 week  Jillana Selph J. Farryn Linares RN, MSN [facility], [facility] RN Care Manager Direct Dial: [phone]  Fax: [phone] Website: [email or web address]

## 2023-12-11 DIAGNOSIS — I110 Hypertensive heart disease with heart failure: Secondary | ICD-10-CM | POA: Diagnosis not present

## 2023-12-11 DIAGNOSIS — D696 Thrombocytopenia, unspecified: Secondary | ICD-10-CM | POA: Diagnosis not present

## 2023-12-11 DIAGNOSIS — S42201D Unspecified fracture of upper end of right humerus, subsequent encounter for fracture with routine healing: Secondary | ICD-10-CM | POA: Diagnosis not present

## 2023-12-11 DIAGNOSIS — K5900 Constipation, unspecified: Secondary | ICD-10-CM | POA: Diagnosis not present

## 2023-12-11 DIAGNOSIS — G40909 Epilepsy, unspecified, not intractable, without status epilepticus: Secondary | ICD-10-CM | POA: Diagnosis not present

## 2023-12-11 DIAGNOSIS — I509 Heart failure, unspecified: Secondary | ICD-10-CM | POA: Diagnosis not present

## 2023-12-13 DIAGNOSIS — G40909 Epilepsy, unspecified, not intractable, without status epilepticus: Secondary | ICD-10-CM | POA: Diagnosis not present

## 2023-12-13 DIAGNOSIS — Z556 Problems related to health literacy: Secondary | ICD-10-CM | POA: Diagnosis not present

## 2023-12-13 DIAGNOSIS — Z7982 Long term (current) use of aspirin: Secondary | ICD-10-CM | POA: Diagnosis not present

## 2023-12-13 DIAGNOSIS — K5900 Constipation, unspecified: Secondary | ICD-10-CM | POA: Diagnosis not present

## 2023-12-13 DIAGNOSIS — I509 Heart failure, unspecified: Secondary | ICD-10-CM | POA: Diagnosis not present

## 2023-12-13 DIAGNOSIS — E782 Mixed hyperlipidemia: Secondary | ICD-10-CM | POA: Diagnosis not present

## 2023-12-13 DIAGNOSIS — Z96611 Presence of right artificial shoulder joint: Secondary | ICD-10-CM | POA: Diagnosis not present

## 2023-12-13 DIAGNOSIS — Z9181 History of falling: Secondary | ICD-10-CM | POA: Diagnosis not present

## 2023-12-13 DIAGNOSIS — Z791 Long term (current) use of non-steroidal anti-inflammatories (NSAID): Secondary | ICD-10-CM | POA: Diagnosis not present

## 2023-12-13 DIAGNOSIS — I110 Hypertensive heart disease with heart failure: Secondary | ICD-10-CM | POA: Diagnosis not present

## 2023-12-13 DIAGNOSIS — Z87891 Personal history of nicotine dependence: Secondary | ICD-10-CM | POA: Diagnosis not present

## 2023-12-13 DIAGNOSIS — F419 Anxiety disorder, unspecified: Secondary | ICD-10-CM | POA: Diagnosis not present

## 2023-12-13 DIAGNOSIS — K219 Gastro-esophageal reflux disease without esophagitis: Secondary | ICD-10-CM | POA: Diagnosis not present

## 2023-12-13 DIAGNOSIS — I051 Rheumatic mitral insufficiency: Secondary | ICD-10-CM | POA: Diagnosis not present

## 2023-12-13 DIAGNOSIS — Z8701 Personal history of pneumonia (recurrent): Secondary | ICD-10-CM | POA: Diagnosis not present

## 2023-12-13 DIAGNOSIS — D649 Anemia, unspecified: Secondary | ICD-10-CM | POA: Diagnosis not present

## 2023-12-13 DIAGNOSIS — R1310 Dysphagia, unspecified: Secondary | ICD-10-CM | POA: Diagnosis not present

## 2023-12-13 DIAGNOSIS — R4189 Other symptoms and signs involving cognitive functions and awareness: Secondary | ICD-10-CM | POA: Diagnosis not present

## 2023-12-13 DIAGNOSIS — D696 Thrombocytopenia, unspecified: Secondary | ICD-10-CM | POA: Diagnosis not present

## 2023-12-13 DIAGNOSIS — Z8781 Personal history of (healed) traumatic fracture: Secondary | ICD-10-CM | POA: Diagnosis not present

## 2023-12-16 ENCOUNTER — Telehealth: Payer: Self-pay

## 2023-12-16 DIAGNOSIS — K5900 Constipation, unspecified: Secondary | ICD-10-CM | POA: Diagnosis not present

## 2023-12-16 DIAGNOSIS — I509 Heart failure, unspecified: Secondary | ICD-10-CM | POA: Diagnosis not present

## 2023-12-16 DIAGNOSIS — K219 Gastro-esophageal reflux disease without esophagitis: Secondary | ICD-10-CM | POA: Diagnosis not present

## 2023-12-16 DIAGNOSIS — G40909 Epilepsy, unspecified, not intractable, without status epilepticus: Secondary | ICD-10-CM | POA: Diagnosis not present

## 2023-12-16 DIAGNOSIS — I110 Hypertensive heart disease with heart failure: Secondary | ICD-10-CM | POA: Diagnosis not present

## 2023-12-16 DIAGNOSIS — D696 Thrombocytopenia, unspecified: Secondary | ICD-10-CM | POA: Diagnosis not present

## 2023-12-17 ENCOUNTER — Telehealth: Payer: Self-pay

## 2023-12-17 NOTE — Transitions of Care (Post Inpatient/ED Visit) (Signed)
 Transition of Care week 4  Visit Note  12/17/2023  Name: Maria [name] MRN: [id number]          DOB: Jul 11, 1960  Situation: Patient enrolled in [facility] 30-day program. Visit completed with patient and borther by telephone.   Background:   Initial Transition Care Management Follow-up Telephone Call    Past Medical History:  Diagnosis Date   Constipation    Dysphagia    Fracture    R foot   GERD (gastroesophageal reflux disease)    Headache    History of shingles 10/2016   Mental retardation    lesion in head   Pneumonia    Seizures (HCC)    Last seizure 05/31/23   Thrombocytopenia (HCC)    Tremor     Assessment: Patient Reported Symptoms: Cognitive Cognitive Status: Alert and oriented to person, place, and time      Neurological Neurological Review of Symptoms: No symptoms reported    HEENT HEENT Symptoms Reported: No symptoms reported      Cardiovascular Cardiovascular Symptoms Reported: No symptoms reported    Respiratory Respiratory Symptoms Reported: No symptoms reported Additional Respiratory Details: Patient states she feels fine    Endocrine      Gastrointestinal Gastrointestinal Symptoms Reported: No symptoms reported      Genitourinary Genitourinary Symptoms Reported: No symptoms reported    Integumentary Integumentary Symptoms Reported: No symptoms reported    Musculoskeletal Musculoskelatal Symptoms Reviewed: No symptoms reported   Falls in the past year?: No    Psychosocial Psychosocial Symptoms Reported: No symptoms reported         There were no vitals filed for this visit.  Medications Reviewed Today     Reviewed by Leonell Lobdell, RN (Case Manager) on 12/17/23 at 1437  Med List Status: <None>   Medication Order Taking? Sig Documenting Provider Last Dose Status Informant  aspirin  EC 81 MG tablet [id number] Yes Take 81 mg by mouth daily. Swallow whole. [provider]  Active Family Member, Pharmacy Records  atorvastatin   (LIPITOR) 20 MG tablet [id number] Yes Take 20 mg by mouth at bedtime. [provider]  Active Family Member, Pharmacy Records  benzonatate  (TESSALON ) 100 MG capsule [id number]  Take 1 capsule (100 mg total) by mouth every 8 (eight) hours.  Patient not taking: Reported on 12/09/2023   Emil Share, DO  Active Family Member, Pharmacy Records  Calcium  Carb-Cholecalciferol (CALCIUM /VITAMIN D  PO) [id number] Yes Take 1 tablet by mouth 2 (two) times daily. 600 mg / 800 units [provider]  Active Family Member, Pharmacy Records  calcium  carbonate (TUMS - DOSED IN MG ELEMENTAL CALCIUM ) 500 MG chewable tablet [id number] Yes Chew 1 tablet by mouth daily as needed for indigestion or heartburn. [provider]  Active Family Member, Pharmacy Records  cholecalciferol (VITAMIN D3) 25 MCG (1000 UNIT) tablet [id number] Yes Take 1,000 Units by mouth 2 (two) times daily. [provider]  Active Family Member, Pharmacy Records  clonazePAM  (KLONOPIN ) 0.5 MG tablet [id number] Yes Take 1 tablet (0.5 mg total) by mouth 2 (two) times daily. Gayland Lauraine PARAS, NP  Active Family Member, Pharmacy Records  divalproex  (DEPAKOTE ) 500 MG DR tablet [id number] Yes Take 2 tablets (1,000 mg total) by mouth daily. Gayland Lauraine PARAS, NP  Active Family Member, Pharmacy Records  guaiFENesin -dextromethorphan  [facility] DM) 100-10 MG/5ML syrup [id number] Yes Take 5 mLs by mouth every 4 (four) hours as needed for cough. Bryn Bernardino NOVAK, MD  Active   lacosamide  (VIMPAT ) 200 MG TABS tablet [id number]  Yes Take 1 tablet (200 mg total) by mouth 2 (two) times daily. Gayland Lauraine PARAS, NP  Active Family Member, Pharmacy Records  Multiple Vitamin (MULTIVITAMIN) capsule [id number] Yes Take 1 capsule by mouth daily. [provider]  Active Family Member, Pharmacy Records  omeprazole  [facility]) 40 MG capsule [id number] Yes TAKE 1 CAPSULE DAILY (NEED OFFICE VISIT) Eartha Angelia Sieving, MD  Active Family Member, Pharmacy Records     Discontinued 06/06/20 1731   senna (SENOKOT) 8.6 MG tablet [id number] Yes Take 2 tablets by mouth daily. [provider]  Active Family Member, Pharmacy Records  Vibegron  (GEMTESA ) 75 MG TABS [id number] Yes Take 1 tablet (75 mg total) by mouth daily. Matilda Senior, MD  Active Family Member, Pharmacy Records  Med List Note Lauralyn Neth, CPhT 06/07/20 [phone]): Joene, Gelder (Mother) [phone] or [phone] handles pt's meds.            Goals Addressed             This Visit's Progress    VBCI Transitions of Care (TOC) Care Plan       Problems:  Recent Hospitalization for treatment of Community Acquired pneumonia Knowledge Deficit Related to pneumonia    12/17/23  Spoke with patient and brother today. Brother reports patient doing good.  No problems.  No changes with medications.  Reviewd  pneumonia and notifying physician for changes.     Goal:  Over the next 30 days, the patient will not experience hospital readmission  Interventions:  Transitions of Care: Reviewed Signs and symptoms of infection  Pneumonia: Contact physician if: You have a new or higher fever. You are coughing more deeply or more often. You are not getting better after 2 days (48 hours). You do not get better as expected         Patient Self Care Activities:  Call provider office for new concerns or questions  Notify RN Care Manager of [facility] call rescheduling needs Take medications as prescribed    Plan:  Telephone follow up appointment with care management team member scheduled for:  12/23/23 @ 200 pm        Recommendation:   Continue Current Plan of Care  Follow Up Plan:   Telephone follow-up in 1 week  Jahnasia Tatum J. Marleena Shubert RN, MSN [facility], [facility] RN Care Manager Direct Dial: [phone]  Fax: [phone] Website: [email or web address]

## 2023-12-22 ENCOUNTER — Ambulatory Visit (INDEPENDENT_AMBULATORY_CARE_PROVIDER_SITE_OTHER): Payer: Medicare Other | Admitting: Neurology

## 2023-12-22 ENCOUNTER — Encounter: Payer: Self-pay | Admitting: Neurology

## 2023-12-22 VITALS — BP 136/82 | HR 73 | Ht 66.0 in | Wt 136.5 lb

## 2023-12-22 DIAGNOSIS — R569 Unspecified convulsions: Secondary | ICD-10-CM

## 2023-12-22 DIAGNOSIS — Z5181 Encounter for therapeutic drug level monitoring: Secondary | ICD-10-CM | POA: Diagnosis not present

## 2023-12-22 DIAGNOSIS — G40909 Epilepsy, unspecified, not intractable, without status epilepticus: Secondary | ICD-10-CM | POA: Diagnosis not present

## 2023-12-22 MED ORDER — CLONAZEPAM 0.5 MG PO TABS: 0.5000 mg | ORAL_TABLET | Freq: Two times a day (BID) | ORAL | 5 refills | Status: DC

## 2023-12-22 MED ORDER — LACOSAMIDE 200 MG PO TABS: 200.0000 mg | ORAL_TABLET | Freq: Two times a day (BID) | ORAL | 3 refills | Status: DC

## 2023-12-22 MED ORDER — DIVALPROEX SODIUM 500 MG PO DR TAB: 1000.0000 mg | DELAYED_RELEASE_TABLET | Freq: Every day | ORAL | 3 refills | Status: DC

## 2023-12-22 NOTE — Progress Notes (Signed)
 Patient: Maria [name] Date of Birth: Dec 17, 1960  Reason for Visit: Follow up History from: Patient, brother Primary Neurologist: Reia Viernes   ASSESSMENT AND PLAN 63 y.o. year old female   1.  Seizures -No recent seizures reported -Continue current doses of Clonazepam , Depakote , Vimpat ; refills appear current (1 more 3 month on Klonopin , Vimpat  due in August) -Check labs today including Depakote , Lacosamide  level -Will also check CBC and CMP  -Follow-up in 6 months or sooner if needed, call for seizures    HISTORY OF PRESENT ILLNESS: Today 12/22/23 Patient presents today for follow-up, last visit was in June 2024, since then she has been doing well, denies any seizure or seizure like activity.  She reports compliance with the medications.  She is accompanied by her brother today.  No other questions or concerns except that 56-months ago she fell and broke her shoulder.  She is currently getting physical therapy.   INTERVAL HISTORY 11/12/2022: SS Here today with her brother. Had a fall last night getting up to use the bathroom, went to urgent care. For right flank pain. Given ibuprofen , tizanidine  today.  No seizures reported. Brother mentions concern for UTI? Brother has noted some confusion. Her mother who manages her medications, is 54. The patient won't do PT. Remains on Klonopin , Depakote , Vimpat . She seems a bit more scattered today than in the past.   04/23/22 SS: Here today with her nephew. No seizures. Remains on Depakote , Klonopin , Vimpat . No falls. No changes to her health. Her mother manages her medications. Her mother is 70. EEG in April 2023 was abnormal due to the presence of frequent spike and slow wave discharges that is consistent with a generalized epileptogenic potential, also present was diffuse slowing.  No problems or concerns.  HISTORY  Dr.Rune Mendez 09/04/21: Patient present today for follow-up, she is accompanied by her mother.  Since last visit he denies any  additional seizures.  She still on Depakote , Vimpat  and clonazepam .  She reported she has been having increased falls, last fall was 2 weeks ago.  Patient denies any prodrome prior to fall, denies any weakness, no dizziness, no lightheadedness seizures falls.  Patient thinks she is not having seizures with these falls.  Per mother her seizures are described as generalized tonic-clonic seizures.   REVIEW OF SYSTEMS: Out of a complete 14 system review of symptoms, the patient complains only of the following symptoms, and all other reviewed systems are negative.  See HPI  ALLERGIES: Allergies  Allergen Reactions   Codeine Nausea And Vomiting   Lamictal [Lamotrigine] Rash    HOME MEDICATIONS: Outpatient Medications Prior to Visit  Medication Sig Dispense Refill   aspirin  EC 81 MG tablet Take 81 mg by mouth daily. Swallow whole.     atorvastatin  (LIPITOR) 20 MG tablet Take 20 mg by mouth at bedtime.     Calcium  Carb-Cholecalciferol (CALCIUM /VITAMIN D  PO) Take 1 tablet by mouth 2 (two) times daily. 600 mg / 800 units     calcium  carbonate (TUMS - DOSED IN MG ELEMENTAL CALCIUM ) 500 MG chewable tablet Chew 1 tablet by mouth daily as needed for indigestion or heartburn.     cholecalciferol (VITAMIN D3) 25 MCG (1000 UNIT) tablet Take 1,000 Units by mouth 2 (two) times daily.     clonazePAM  (KLONOPIN ) 0.5 MG tablet Take 1 tablet (0.5 mg total) by mouth 2 (two) times daily. 14 tablet 0   divalproex  (DEPAKOTE ) 500 MG DR tablet Take 2 tablets (1,000 mg total) by mouth daily. 180  tablet 0   lacosamide  (VIMPAT ) 200 MG TABS tablet Take 1 tablet (200 mg total) by mouth 2 (two) times daily. 180 tablet 1   Multiple Vitamin (MULTIVITAMIN) capsule Take 1 capsule by mouth daily.     omeprazole  (PRILOSEC) 40 MG capsule TAKE 1 CAPSULE DAILY (NEED OFFICE VISIT) 90 capsule 3   senna (SENOKOT) 8.6 MG tablet Take 2 tablets by mouth daily.     Vibegron  (GEMTESA ) 75 MG TABS Take 1 tablet (75 mg total) by mouth daily.      benzonatate  (TESSALON ) 100 MG capsule Take 1 capsule (100 mg total) by mouth every 8 (eight) hours. (Patient not taking: Reported on 12/22/2023) 21 capsule 0   guaiFENesin -dextromethorphan  (ROBITUSSIN DM) 100-10 MG/5ML syrup Take 5 mLs by mouth every 4 (four) hours as needed for cough. (Patient not taking: Reported on 12/22/2023) 118 mL 0   No facility-administered medications prior to visit.    PAST MEDICAL HISTORY: Past Medical History:  Diagnosis Date   Constipation    Dysphagia    Fracture    R foot   GERD (gastroesophageal reflux disease)    Headache    History of shingles 10/2016   Mental retardation    lesion in head   Pneumonia    Seizures (HCC)    Last seizure 05/31/23   Thrombocytopenia (HCC)    Tremor     PAST SURGICAL HISTORY: Past Surgical History:  Procedure Laterality Date   BIOPSY  09/16/2014   Procedure: BIOPSY;  Surgeon: Claudis RAYMOND Rivet, MD;  Location: [facility];  Service: Endoscopy;;   CATARACT EXTRACTION     both eyes, May of 2015   COLONOSCOPY     COLONOSCOPY WITH PROPOFOL  N/A 11/20/2018   Procedure: COLONOSCOPY WITH PROPOFOL ;  Surgeon: Rivet Claudis RAYMOND, MD;  [facility]: [facility];  Service: Endoscopy;  Laterality: N/A;   COLONOSCOPY WITH PROPOFOL  N/A 08/07/2022   Procedure: COLONOSCOPY WITH PROPOFOL ;  Surgeon: Eartha Angelia Sieving, MD;  Location: [facility];  Service: Gastroenterology;  Laterality: N/A;  7:30am, asa 1-2   ESOPHAGEAL DILATION N/A 09/16/2014   Procedure: ESOPHAGEAL DILATION WITH 54FR MALONEY DILATOR;  Surgeon: Claudis RAYMOND Rivet, MD;  Location: [facility];  Service: Endoscopy;  Laterality: N/A;   ESOPHAGOGASTRODUODENOSCOPY (EGD) WITH PROPOFOL  N/A 09/16/2014   Procedure: ESOPHAGOGASTRODUODENOSCOPY (EGD) WITH PROPOFOL ;  Surgeon: Claudis RAYMOND Rivet, MD;  Location: [facility];  Service: Endoscopy;  Laterality: N/A;   HEMOSTASIS CLIP PLACEMENT  08/07/2022   Procedure: HEMOSTASIS CLIP PLACEMENT;  Surgeon: Eartha Angelia Sieving, MD;  [facility];  Service: Gastroenterology;;   LAPAROSCOPIC APPENDECTOMY N/A 07/14/2020   Procedure: APPENDECTOMY LAPAROSCOPIC;  Surgeon: Kallie Manuelita BROCKS, MD;  Location: [facility];  Service: General;  Laterality: N/A;   MOUTH SURGERY     POLYPECTOMY  11/20/2018   Procedure: POLYPECTOMY;  Surgeon: Rivet Claudis RAYMOND, MD;  [facility]: [facility];  Service: Endoscopy;;  colon   POLYPECTOMY  08/07/2022   Procedure: POLYPECTOMY;  Surgeon: Eartha Angelia Sieving, MD;  [facility]: [facility];  Service: Gastroenterology;;   REVERSE SHOULDER ARTHROPLASTY Right 06/12/2023   Procedure: REVERSE SHOULDER ARTHROPLASTY;  Surgeon: Cristy Bonner DASEN, MD;  Location: [facility];  Service: Orthopedics;  Laterality: Right;   Skin graft to gum Right 08/2013   SUBMUCOSAL LIFTING INJECTION  08/07/2022   Procedure: SUBMUCOSAL LIFTING INJECTION;  Surgeon: Eartha Angelia Sieving, MD;  [facility]: [facility];  Service: Gastroenterology;;   TONSILLECTOMY AND ADENOIDECTOMY     TOTAL ABDOMINAL HYSTERECTOMY  FAMILY HISTORY: Family History  Problem Relation Age of Onset   High Cholesterol Mother    High blood pressure Mother    Diabetes Father     SOCIAL HISTORY: Social History   Socioeconomic History   Marital status: Single    Spouse name: Not on file   Number of children: 0   Years of education: 10   Highest education level: Not on file  Occupational History    Employer: UNEMPLOYED  Tobacco Use   Smoking status: Former    Current packs/day: 0.00    Average packs/day: 1.5 packs/day for 15.0 years (22.5 ttl pk-yrs)    Types: Cigarettes    Start date: 05/23/1991    Quit date: 05/22/2006    Years since quitting: 17.5   Smokeless tobacco: Never  Vaping Use   Vaping status: Never Used  Substance and Sexual Activity   Alcohol use: No    Alcohol/week: 0.0 standard drinks of alcohol   Drug use: No   Sexual activity: Never    Birth control/protection: Other-see comments    Comment: Hysterectomy  Other Topics Concern    Not on file  Social History Narrative   Patient is single and lives with her mother Rosezella)      Patient drinks 3 cups of caffeine daily.   Patient is right handed.         Social Drivers of Corporate investment banker Strain: Not on file  Food Insecurity: Unknown (11/25/2023)   Hunger Vital Sign    Worried About Running Out of Food in the Last Year: Not on file    Ran Out of Food in the Last Year: Never true  Transportation Needs: No Transportation Needs (11/25/2023)   PRAPARE - Administrator, Civil Service (Medical): No    Lack of Transportation (Non-Medical): No  Physical Activity: Not on file  Stress: Not on file  Social Connections: Not on file  Intimate Partner Violence: Patient Unable To Answer (11/25/2023)   Humiliation, Afraid, Rape, and Kick questionnaire    Fear of Current or Ex-Partner: Patient unable to answer    Emotionally Abused: Patient unable to answer    Physically Abused: Patient unable to answer    Sexually Abused: Patient unable to answer   PHYSICAL EXAM  Vitals:   12/22/23 1533  BP: 136/82  Pulse: 73  SpO2: 97%  Weight: 136 lb 8 oz (61.9 kg)  Height: 5' 6 (1.676 m)    Body mass index is 22.03 kg/m.  Generalized: Well developed, in no acute distress  Neurological examination  Mentation: Alert, is limited historian, some cognitive impairment, follows most exam commands, little more confused today, scattered  Cranial nerve II-XII: Pupils were equal round reactive to light. Extraocular movements were full, visual field were full on confrontational test. Facial sensation and strength were normal. Head turning and shoulder shrug were normal and symmetric. Motor: The motor testing reveals 5 over 5 strength of all 4 extremities. Good symmetric motor tone is noted throughout.  Sensory: Sensory testing is intact to soft touch on all 4 extremities. No evidence of extinction is noted.  Coordination: Cerebellar testing reveals good  finger-nose-finger and heel-to-shin bilaterally.  Mild tremor with finger-nose-finger. Gait and station: Gait cautious, looks down, tremor with right hand  Reflexes: Deep tendon reflexes are symmetric and normal bilaterally.   DIAGNOSTIC DATA (LABS, IMAGING, TESTING) - I reviewed patient records, labs, notes, testing and imaging myself where available.  Lab Results  Component Value Date  WBC 5.4 11/24/2023   HGB 10.3 (L) 11/24/2023   HCT 31.1 (L) 11/24/2023   MCV 92.3 11/24/2023   PLT 145 (L) 11/24/2023      Component Value Date/Time   NA 141 11/24/2023 0417   NA 142 11/12/2022 1543   K 3.6 11/24/2023 0417   CL 106 11/24/2023 0417   CO2 24 11/24/2023 0417   GLUCOSE 117 (H) 11/24/2023 0417   BUN 13 11/24/2023 0417   BUN 25 11/12/2022 1543   CREATININE 0.68 11/24/2023 0417   CALCIUM  9.2 11/24/2023 0417   PROT 6.6 11/20/2023 2339   PROT 6.4 11/12/2022 1543   ALBUMIN  3.8 11/20/2023 2339   ALBUMIN  4.3 11/12/2022 1543   AST 18 11/20/2023 2339   ALT 18 11/20/2023 2339   ALKPHOS 76 11/20/2023 2339   BILITOT 0.8 11/20/2023 2339   BILITOT 0.3 11/12/2022 1543   GFRNONAA >60 11/24/2023 0417   GFRAA >60 06/11/2017 1542   Lab Results  Component Value Date   CHOL  10/13/2009    169        ATP III CLASSIFICATION:  <200     mg/dL   Desirable  799-760  mg/dL   Borderline High  >=759    mg/dL   High          HDL 47 10/13/2009   LDLCALC (H) 10/13/2009    108        Total Cholesterol/HDL:CHD Risk Coronary Heart Disease Risk Table                     Men   Women  1/2 Average Risk   3.4   3.3  Average Risk       5.0   4.4  2 X Average Risk   9.6   7.1  3 X Average Risk  23.4   11.0        Use the calculated Patient Ratio above and the CHD Risk Table to determine the patient's CHD Risk.        ATP III CLASSIFICATION (LDL):  <100     mg/dL   Optimal  899-870  mg/dL   Near or Above                    Optimal  130-159  mg/dL   Borderline  839-810  mg/dL   High  >809      mg/dL   Very High   TRIG 69 94/86/7988   CHOLHDL 3.6 10/13/2009   Lab Results  Component Value Date   HGBA1C 5.7 (H) 04/17/2020   Lab Results  Component Value Date   VITAMINB12 765 11/22/2023   Lab Results  Component Value Date   TSH 0.595 11/22/2023    Pastor Falling, MD  12/22/2023, 4:01 PM [facility] [address] [place], [place] [address] [phone]

## 2023-12-22 NOTE — Patient Instructions (Addendum)
 Continue current medications   Depakote  1000 mg twice daily Lacosamide  200 mg twice daily Klonopin  0.5 mg twice daily  We will check antiseizure medication level with CMP and CBC. Continue your other medications Continue follow-up PCP Return in 1 year or sooner if worse

## 2023-12-23 ENCOUNTER — Other Ambulatory Visit: Payer: Self-pay

## 2023-12-23 NOTE — Patient Instructions (Signed)
 Visit Information  Thank you for taking time to visit with me today. Please don't hesitate to contact me if I can be of assistance to you   Following is a copy of your care plan:   Goals Addressed             This Visit's Progress    COMPLETED: VBCI Transitions of Care (TOC) Care Plan       Problems:  Recent Hospitalization for treatment of Community Acquired pneumonia Knowledge Deficit Related to pneumonia    12/23/23  Spoke with patient and brother today. Brother reports patient doing good.  No problems.  No changes with medications.  Neurology appointment on 12/22/23. No changes.       Goal:  Over the next 30 days, the patient will not experience hospital readmission  Interventions:  Transitions of Care: Reviewed Signs and symptoms of infection   Patient Self Care Activities:  Call provider office for new concerns or questions  Take medications as prescribed    Plan:  No further follow up required: Patient has completed 30 day TOC program        Patient verbalizes understanding of instructions and care plan provided today and agrees to view in MyChart. Active MyChart status and patient understanding of how to access instructions and care plan via MyChart confirmed with patient.     The patient has been provided with contact information for the care management team and has been advised to call with any health related questions or concerns.   Please call the care guide team at [phone] if you need to cancel or reschedule your appointment.   Please call the Suicide and Crisis Lifeline: 988 if you are experiencing a Mental Health or Behavioral Health Crisis or need someone to talk to.  Laekyn Rayos J. Bethzaida Boord RN, MSN [facility], [facility] RN Care Manager Direct Dial: [phone]  Fax: [phone] Website: [email or web address]

## 2023-12-23 NOTE — Transitions of Care (Post Inpatient/ED Visit) (Signed)
 Transition of Care Week 5 final call  Visit Note  12/23/2023  Name: Maria [name] MRN: [id number]          DOB: Oct 03, 1960  Situation: Patient enrolled in [facility] 30-day program. Visit completed with patient and brother [name] by telephone.   Background:   Initial Transition Care Management Follow-up Telephone Call    Past Medical History:  Diagnosis Date   Constipation    Dysphagia    Fracture    R foot   GERD (gastroesophageal reflux disease)    Headache    History of shingles 10/2016   Mental retardation    lesion in head   Pneumonia    Seizures (HCC)    Last seizure 05/31/23   Thrombocytopenia (HCC)    Tremor     Assessment: Patient Reported Symptoms: Cognitive Cognitive Status: Alert and oriented to person, place, and time Cognitive/Intellectual Conditions Management [RPT]: Intellectual Disability      Neurological Neurological Review of Symptoms: No symptoms reported    HEENT HEENT Symptoms Reported: No symptoms reported      Cardiovascular Cardiovascular Symptoms Reported: No symptoms reported    Respiratory Respiratory Symptoms Reported: No symptoms reported    Endocrine Endocrine Symptoms Reported: No symptoms reported    Gastrointestinal Gastrointestinal Symptoms Reported: No symptoms reported      Genitourinary Genitourinary Symptoms Reported: No symptoms reported    Integumentary Integumentary Symptoms Reported: No symptoms reported    Musculoskeletal Musculoskelatal Symptoms Reviewed: No symptoms reported        Psychosocial Psychosocial Symptoms Reported: No symptoms reported         There were no vitals filed for this visit.  Medications Reviewed Today     Reviewed by Leanette Eutsler, RN (Case Manager) on 12/23/23 at 1407  Med List Status: <None>   Medication Order Taking? Sig Documenting Provider Last Dose Status Informant  aspirin  EC 81 MG tablet [id number] Yes Take 81 mg by mouth daily. Swallow whole. [provider]   Active Family Member, Pharmacy Records  atorvastatin  (LIPITOR) 20 MG tablet [id number] Yes Take 20 mg by mouth at bedtime. [provider]  Active Family Member, Pharmacy Records  benzonatate  (TESSALON ) 100 MG capsule [id number]  Take 1 capsule (100 mg total) by mouth every 8 (eight) hours.  Patient not taking: Reported on 12/23/2023   Emil Share, DO  Active Family Member, Pharmacy Records  Calcium  Carb-Cholecalciferol (CALCIUM /VITAMIN D  PO) [id number] Yes Take 1 tablet by mouth 2 (two) times daily. 600 mg / 800 units [provider]  Active Family Member, Pharmacy Records  calcium  carbonate (TUMS - DOSED IN MG ELEMENTAL CALCIUM ) 500 MG chewable tablet [id number] Yes Chew 1 tablet by mouth daily as needed for indigestion or heartburn. [provider]  Active Family Member, Pharmacy Records  cholecalciferol (VITAMIN D3) 25 MCG (1000 UNIT) tablet [id number] Yes Take 1,000 Units by mouth 2 (two) times daily. [provider]  Active Family Member, Pharmacy Records  clonazePAM  (KLONOPIN ) 0.5 MG tablet [id number] Yes Take 1 tablet (0.5 mg total) by mouth 2 (two) times daily. Camara, Amadou, MD  Active   divalproex  (DEPAKOTE ) 500 MG DR tablet [id number] Yes Take 2 tablets (1,000 mg total) by mouth daily. Camara, Amadou, MD  Active   guaiFENesin -dextromethorphan  (ROBITUSSIN DM) 100-10 MG/5ML syrup [id number] Yes Take 5 mLs by mouth every 4 (four) hours as needed for cough. Bryn Bernardino NOVAK, MD  Active   lacosamide  (VIMPAT ) 200 MG TABS tablet [id number] Yes Take 1 tablet (200 mg total)  by mouth 2 (two) times daily. Gregg Lek, MD  Active   Multiple Vitamin (MULTIVITAMIN) capsule [id number] Yes Take 1 capsule by mouth daily. [provider]  Active Family Member, Pharmacy Records  omeprazole  [facility]) 40 MG capsule [id number]  TAKE 1 CAPSULE DAILY (NEED OFFICE VISIT) Eartha Angelia Sieving, MD  Active Family Member, Pharmacy Records    Discontinued 06/06/20 1731   senna  (SENOKOT) 8.6 MG tablet [id number] Yes Take 2 tablets by mouth daily. [provider]  Active Family Member, Pharmacy Records  Vibegron  (GEMTESA ) 75 MG TABS [id number] Yes Take 1 tablet (75 mg total) by mouth daily. Matilda Senior, MD  Active Family Member, Pharmacy Records  Med List Note Lauralyn Neth, CPhT 06/07/20 9042): [name], [name] (Mother) [phone] or [phone] handles pt's meds.            Goals Addressed             This Visit's Progress    COMPLETED: VBCI Transitions of Care (TOC) Care Plan       Problems:  Recent Hospitalization for treatment of Community Acquired pneumonia Knowledge Deficit Related to pneumonia    12/23/23  Spoke with patient and brother today. Brother reports patient doing good.  No problems.  No changes with medications.  Neurology appointment on 12/22/23. No changes.       Goal:  Over the next 30 days, the patient will not experience hospital readmission  Interventions:  Transitions of Care: Reviewed Signs and symptoms of infection   Patient Self Care Activities:  Call provider office for new concerns or questions  Take medications as prescribed    Plan:  No further follow up required: Patient has completed 30 day TOC program         Recommendation:   Continue Current Plan of Care  Follow Up Plan:   Closing From:  Transitions of Care Program   Mikaila Grunert DOROTHA Seeds RN, MSN [place]  [facility], [facility] RN Care Manager Direct Dial: [phone]  Fax: [phone] Website: [email or web address]

## 2023-12-25 ENCOUNTER — Ambulatory Visit: Payer: Self-pay | Admitting: Neurology

## 2023-12-25 LAB — CBC WITH DIFFERENTIAL/PLATELET
Basophils Absolute: 0 x10E3/uL (ref 0.0–0.2)
Basos: 1 %
EOS (ABSOLUTE): 0.1 x10E3/uL (ref 0.0–0.4)
Eos: 2 %
Hematocrit: 38.5 % (ref 34.0–46.6)
Hemoglobin: 12.6 g/dL (ref 11.1–15.9)
Immature Grans (Abs): 0 x10E3/uL (ref 0.0–0.1)
Immature Granulocytes: 0 %
Lymphocytes Absolute: 2.5 x10E3/uL (ref 0.7–3.1)
Lymphs: 43 %
MCH: 31.4 pg (ref 26.6–33.0)
MCHC: 32.7 g/dL (ref 31.5–35.7)
MCV: 96 fL (ref 79–97)
Monocytes Absolute: 0.5 x10E3/uL (ref 0.1–0.9)
Monocytes: 8 %
Neutrophils Absolute: 2.7 x10E3/uL (ref 1.4–7.0)
Neutrophils: 46 %
Platelets: 113 x10E3/uL — ABNORMAL LOW (ref 150–450)
RBC: 4.01 x10E6/uL (ref 3.77–5.28)
RDW: 14.4 % (ref 11.7–15.4)
WBC: 5.8 x10E3/uL (ref 3.4–10.8)

## 2023-12-25 LAB — COMPREHENSIVE METABOLIC PANEL WITH GFR
ALT: 15 IU/L (ref 0–32)
AST: 14 IU/L (ref 0–40)
Albumin: 4.4 g/dL (ref 3.9–4.9)
Alkaline Phosphatase: 92 IU/L (ref 44–121)
BUN/Creatinine Ratio: 41 — ABNORMAL HIGH (ref 12–28)
BUN: 27 mg/dL (ref 8–27)
Bilirubin Total: 0.2 mg/dL (ref 0.0–1.2)
CO2: 24 mmol/L (ref 20–29)
Calcium: 9.9 mg/dL (ref 8.7–10.3)
Chloride: 105 mmol/L (ref 96–106)
Creatinine, Ser: 0.66 mg/dL (ref 0.57–1.00)
Globulin, Total: 2.3 g/dL (ref 1.5–4.5)
Glucose: 88 mg/dL (ref 70–99)
Potassium: 4 mmol/L (ref 3.5–5.2)
Sodium: 144 mmol/L (ref 134–144)
Total Protein: 6.7 g/dL (ref 6.0–8.5)
eGFR: 100 mL/min/1.73 (ref >59)

## 2023-12-25 LAB — LACOSAMIDE: Lacosamide: 18.3 ug/mL — AB (ref 5.0–10.0)

## 2023-12-25 LAB — VALPROIC ACID LEVEL: Valproic Acid Lvl: 94 ug/mL (ref 50–100)

## 2023-12-26 DIAGNOSIS — G40909 Epilepsy, unspecified, not intractable, without status epilepticus: Secondary | ICD-10-CM | POA: Diagnosis not present

## 2023-12-26 DIAGNOSIS — K219 Gastro-esophageal reflux disease without esophagitis: Secondary | ICD-10-CM | POA: Diagnosis not present

## 2023-12-26 DIAGNOSIS — I110 Hypertensive heart disease with heart failure: Secondary | ICD-10-CM | POA: Diagnosis not present

## 2023-12-26 DIAGNOSIS — D696 Thrombocytopenia, unspecified: Secondary | ICD-10-CM | POA: Diagnosis not present

## 2023-12-26 DIAGNOSIS — K5900 Constipation, unspecified: Secondary | ICD-10-CM | POA: Diagnosis not present

## 2023-12-26 DIAGNOSIS — I509 Heart failure, unspecified: Secondary | ICD-10-CM | POA: Diagnosis not present

## 2023-12-31 DIAGNOSIS — I110 Hypertensive heart disease with heart failure: Secondary | ICD-10-CM | POA: Diagnosis not present

## 2023-12-31 DIAGNOSIS — K219 Gastro-esophageal reflux disease without esophagitis: Secondary | ICD-10-CM | POA: Diagnosis not present

## 2023-12-31 DIAGNOSIS — K5900 Constipation, unspecified: Secondary | ICD-10-CM | POA: Diagnosis not present

## 2023-12-31 DIAGNOSIS — D696 Thrombocytopenia, unspecified: Secondary | ICD-10-CM | POA: Diagnosis not present

## 2023-12-31 DIAGNOSIS — I509 Heart failure, unspecified: Secondary | ICD-10-CM | POA: Diagnosis not present

## 2023-12-31 DIAGNOSIS — G40909 Epilepsy, unspecified, not intractable, without status epilepticus: Secondary | ICD-10-CM | POA: Diagnosis not present

## 2024-01-05 DIAGNOSIS — I110 Hypertensive heart disease with heart failure: Secondary | ICD-10-CM | POA: Diagnosis not present

## 2024-01-05 DIAGNOSIS — K5900 Constipation, unspecified: Secondary | ICD-10-CM | POA: Diagnosis not present

## 2024-01-05 DIAGNOSIS — K219 Gastro-esophageal reflux disease without esophagitis: Secondary | ICD-10-CM | POA: Diagnosis not present

## 2024-01-05 DIAGNOSIS — G40909 Epilepsy, unspecified, not intractable, without status epilepticus: Secondary | ICD-10-CM | POA: Diagnosis not present

## 2024-01-05 DIAGNOSIS — D696 Thrombocytopenia, unspecified: Secondary | ICD-10-CM | POA: Diagnosis not present

## 2024-01-05 DIAGNOSIS — I509 Heart failure, unspecified: Secondary | ICD-10-CM | POA: Diagnosis not present

## 2024-01-12 DIAGNOSIS — Z87891 Personal history of nicotine dependence: Secondary | ICD-10-CM | POA: Diagnosis not present

## 2024-01-12 DIAGNOSIS — K5900 Constipation, unspecified: Secondary | ICD-10-CM | POA: Diagnosis not present

## 2024-01-12 DIAGNOSIS — D696 Thrombocytopenia, unspecified: Secondary | ICD-10-CM | POA: Diagnosis not present

## 2024-01-12 DIAGNOSIS — Z96611 Presence of right artificial shoulder joint: Secondary | ICD-10-CM | POA: Diagnosis not present

## 2024-01-12 DIAGNOSIS — Z8701 Personal history of pneumonia (recurrent): Secondary | ICD-10-CM | POA: Diagnosis not present

## 2024-01-12 DIAGNOSIS — Z8781 Personal history of (healed) traumatic fracture: Secondary | ICD-10-CM | POA: Diagnosis not present

## 2024-01-12 DIAGNOSIS — Z7982 Long term (current) use of aspirin: Secondary | ICD-10-CM | POA: Diagnosis not present

## 2024-01-12 DIAGNOSIS — Z791 Long term (current) use of non-steroidal anti-inflammatories (NSAID): Secondary | ICD-10-CM | POA: Diagnosis not present

## 2024-01-12 DIAGNOSIS — Z9181 History of falling: Secondary | ICD-10-CM | POA: Diagnosis not present

## 2024-01-12 DIAGNOSIS — I509 Heart failure, unspecified: Secondary | ICD-10-CM | POA: Diagnosis not present

## 2024-01-12 DIAGNOSIS — R1310 Dysphagia, unspecified: Secondary | ICD-10-CM | POA: Diagnosis not present

## 2024-01-12 DIAGNOSIS — E782 Mixed hyperlipidemia: Secondary | ICD-10-CM | POA: Diagnosis not present

## 2024-01-12 DIAGNOSIS — F419 Anxiety disorder, unspecified: Secondary | ICD-10-CM | POA: Diagnosis not present

## 2024-01-12 DIAGNOSIS — D649 Anemia, unspecified: Secondary | ICD-10-CM | POA: Diagnosis not present

## 2024-01-12 DIAGNOSIS — G40909 Epilepsy, unspecified, not intractable, without status epilepticus: Secondary | ICD-10-CM | POA: Diagnosis not present

## 2024-01-12 DIAGNOSIS — R4189 Other symptoms and signs involving cognitive functions and awareness: Secondary | ICD-10-CM | POA: Diagnosis not present

## 2024-01-12 DIAGNOSIS — I051 Rheumatic mitral insufficiency: Secondary | ICD-10-CM | POA: Diagnosis not present

## 2024-01-12 DIAGNOSIS — Z556 Problems related to health literacy: Secondary | ICD-10-CM | POA: Diagnosis not present

## 2024-01-12 DIAGNOSIS — K219 Gastro-esophageal reflux disease without esophagitis: Secondary | ICD-10-CM | POA: Diagnosis not present

## 2024-01-12 DIAGNOSIS — I110 Hypertensive heart disease with heart failure: Secondary | ICD-10-CM | POA: Diagnosis not present

## 2024-01-14 DIAGNOSIS — G40909 Epilepsy, unspecified, not intractable, without status epilepticus: Secondary | ICD-10-CM | POA: Diagnosis not present

## 2024-01-14 DIAGNOSIS — D696 Thrombocytopenia, unspecified: Secondary | ICD-10-CM | POA: Diagnosis not present

## 2024-01-14 DIAGNOSIS — I110 Hypertensive heart disease with heart failure: Secondary | ICD-10-CM | POA: Diagnosis not present

## 2024-01-14 DIAGNOSIS — K5900 Constipation, unspecified: Secondary | ICD-10-CM | POA: Diagnosis not present

## 2024-01-14 DIAGNOSIS — K219 Gastro-esophageal reflux disease without esophagitis: Secondary | ICD-10-CM | POA: Diagnosis not present

## 2024-01-14 DIAGNOSIS — I509 Heart failure, unspecified: Secondary | ICD-10-CM | POA: Diagnosis not present

## 2024-01-21 DIAGNOSIS — G40909 Epilepsy, unspecified, not intractable, without status epilepticus: Secondary | ICD-10-CM | POA: Diagnosis not present

## 2024-01-21 DIAGNOSIS — K219 Gastro-esophageal reflux disease without esophagitis: Secondary | ICD-10-CM | POA: Diagnosis not present

## 2024-01-21 DIAGNOSIS — K5900 Constipation, unspecified: Secondary | ICD-10-CM | POA: Diagnosis not present

## 2024-01-21 DIAGNOSIS — I509 Heart failure, unspecified: Secondary | ICD-10-CM | POA: Diagnosis not present

## 2024-01-21 DIAGNOSIS — I110 Hypertensive heart disease with heart failure: Secondary | ICD-10-CM | POA: Diagnosis not present

## 2024-01-21 DIAGNOSIS — D696 Thrombocytopenia, unspecified: Secondary | ICD-10-CM | POA: Diagnosis not present

## 2024-01-22 DIAGNOSIS — L209 Atopic dermatitis, unspecified: Secondary | ICD-10-CM | POA: Insufficient documentation

## 2024-01-22 DIAGNOSIS — M8580 Other specified disorders of bone density and structure, unspecified site: Secondary | ICD-10-CM | POA: Diagnosis not present

## 2024-01-22 DIAGNOSIS — R944 Abnormal results of kidney function studies: Secondary | ICD-10-CM | POA: Diagnosis not present

## 2024-01-22 DIAGNOSIS — E785 Hyperlipidemia, unspecified: Secondary | ICD-10-CM | POA: Diagnosis not present

## 2024-01-22 DIAGNOSIS — F419 Anxiety disorder, unspecified: Secondary | ICD-10-CM | POA: Diagnosis not present

## 2024-01-22 DIAGNOSIS — D696 Thrombocytopenia, unspecified: Secondary | ICD-10-CM | POA: Diagnosis not present

## 2024-01-22 DIAGNOSIS — K219 Gastro-esophageal reflux disease without esophagitis: Secondary | ICD-10-CM | POA: Diagnosis not present

## 2024-01-22 DIAGNOSIS — G40909 Epilepsy, unspecified, not intractable, without status epilepticus: Secondary | ICD-10-CM | POA: Diagnosis not present

## 2024-01-22 DIAGNOSIS — N3281 Overactive bladder: Secondary | ICD-10-CM | POA: Diagnosis not present

## 2024-01-28 DIAGNOSIS — K5900 Constipation, unspecified: Secondary | ICD-10-CM | POA: Diagnosis not present

## 2024-01-28 DIAGNOSIS — I110 Hypertensive heart disease with heart failure: Secondary | ICD-10-CM | POA: Diagnosis not present

## 2024-01-28 DIAGNOSIS — K219 Gastro-esophageal reflux disease without esophagitis: Secondary | ICD-10-CM | POA: Diagnosis not present

## 2024-01-28 DIAGNOSIS — I509 Heart failure, unspecified: Secondary | ICD-10-CM | POA: Diagnosis not present

## 2024-01-28 DIAGNOSIS — D696 Thrombocytopenia, unspecified: Secondary | ICD-10-CM | POA: Diagnosis not present

## 2024-01-28 DIAGNOSIS — G40909 Epilepsy, unspecified, not intractable, without status epilepticus: Secondary | ICD-10-CM | POA: Diagnosis not present

## 2024-02-04 DIAGNOSIS — K5900 Constipation, unspecified: Secondary | ICD-10-CM | POA: Diagnosis not present

## 2024-02-04 DIAGNOSIS — G40909 Epilepsy, unspecified, not intractable, without status epilepticus: Secondary | ICD-10-CM | POA: Diagnosis not present

## 2024-02-04 DIAGNOSIS — K219 Gastro-esophageal reflux disease without esophagitis: Secondary | ICD-10-CM | POA: Diagnosis not present

## 2024-02-04 DIAGNOSIS — I110 Hypertensive heart disease with heart failure: Secondary | ICD-10-CM | POA: Diagnosis not present

## 2024-02-04 DIAGNOSIS — I509 Heart failure, unspecified: Secondary | ICD-10-CM | POA: Diagnosis not present

## 2024-02-04 DIAGNOSIS — D696 Thrombocytopenia, unspecified: Secondary | ICD-10-CM | POA: Diagnosis not present

## 2024-02-09 DIAGNOSIS — K5900 Constipation, unspecified: Secondary | ICD-10-CM | POA: Diagnosis not present

## 2024-02-09 DIAGNOSIS — K219 Gastro-esophageal reflux disease without esophagitis: Secondary | ICD-10-CM | POA: Diagnosis not present

## 2024-02-09 DIAGNOSIS — I509 Heart failure, unspecified: Secondary | ICD-10-CM | POA: Diagnosis not present

## 2024-02-09 DIAGNOSIS — D696 Thrombocytopenia, unspecified: Secondary | ICD-10-CM | POA: Diagnosis not present

## 2024-02-09 DIAGNOSIS — G40909 Epilepsy, unspecified, not intractable, without status epilepticus: Secondary | ICD-10-CM | POA: Diagnosis not present

## 2024-02-09 DIAGNOSIS — I110 Hypertensive heart disease with heart failure: Secondary | ICD-10-CM | POA: Diagnosis not present

## 2024-02-23 ENCOUNTER — Other Ambulatory Visit: Payer: Self-pay | Admitting: Neurology

## 2024-02-23 DIAGNOSIS — R569 Unspecified convulsions: Secondary | ICD-10-CM

## 2024-02-23 MED ORDER — LACOSAMIDE 200 MG PO TABS: 200.0000 mg | ORAL_TABLET | Freq: Two times a day (BID) | ORAL | 3 refills | Status: DC

## 2024-02-23 NOTE — Telephone Encounter (Signed)
 Requested Prescriptions   Pending Prescriptions Disp Refills   lacosamide  (VIMPAT ) 200 MG TABS tablet 180 tablet 3    Sig: Take 1 tablet (200 mg total) by mouth 2 (two) times daily.   Last seen 12/22/23 Next appt 12/23/24 Dispenses   Dispensed Days Supply Quantity Provider Pharmacy  LACOSAMIDE  200 MG TABLET 01/24/2024 90 180 tablet Gregg Lek, MD [facility]  LACOSAMIDE  200 MG TABLET 11/17/2023 90 180 tablet Gayland Lauraine PARAS, NP [facility]...  LACOSAMIDE  200MG  TABLETS 11/12/2023 7 14 each Gayland Lauraine PARAS, NP [facility]  LACOSAMIDE  200 MG TABLET 07/22/2023 90 180 tablet Gayland Lauraine PARAS, NP [facility]  LACOSAMIDE  200 MG TABLET 04/26/2023 90 180 tablet Gayland Lauraine PARAS, NP [facility][email or web address]

## 2024-02-23 NOTE — Telephone Encounter (Signed)
 Patient's sister, Katheryn Lewis request refill for lacosamide  (VIMPAT ) 200 MG TABS tablet send to one time refill to [facility] [phone] due to patient will out of medication Wednesday

## 2024-03-17 ENCOUNTER — Encounter (INDEPENDENT_AMBULATORY_CARE_PROVIDER_SITE_OTHER): Payer: Self-pay | Admitting: Gastroenterology

## 2024-03-22 NOTE — Progress Notes (Deleted)
 Impression/Assessment:  OAB sx's--treated with Gemtesa   Plan:    History of Present Illness: Here for f/u of urinary frequency. At last visit she was given samples of Gemtesa . On oxybutynin  she had frequent need for enemas d/t constipation.  She has been maintained on Gemtesa  for her LUTS.  With this, which is well-tolerated, her urinary symptoms are tolerable.  She has had no recent symptoms of UTI and no blood in her urine.  She is here today with her brother.  Past Medical History:  Diagnosis Date   Constipation    Dysphagia    Fracture    R foot   GERD (gastroesophageal reflux disease)    Headache    History of shingles 10/2016   Mental retardation    lesion in head   Pneumonia    Seizures (HCC)    Last seizure 05/31/23   Thrombocytopenia    Tremor     Past Surgical History:  Procedure Laterality Date   BIOPSY  09/16/2014   Procedure: BIOPSY;  Surgeon: Claudis RAYMOND Rivet, MD;  Location: [facility];  Service: Endoscopy;;   CATARACT EXTRACTION     both eyes, May of 2015   COLONOSCOPY     COLONOSCOPY WITH PROPOFOL  N/A 11/20/2018   Procedure: COLONOSCOPY WITH PROPOFOL ;  Surgeon: Rivet Claudis RAYMOND, MD;  Location: [facility];  Service: Endoscopy;  Laterality: N/A;   COLONOSCOPY WITH PROPOFOL  N/A 08/07/2022   Procedure: COLONOSCOPY WITH PROPOFOL ;  Surgeon: Eartha Angelia Sieving, MD;  Location: [facility];  Service: Gastroenterology;  Laterality: N/A;  7:30am, asa 1-2   ESOPHAGEAL DILATION N/A 09/16/2014   Procedure: ESOPHAGEAL DILATION WITH 54FR MALONEY DILATOR;  Surgeon: Claudis RAYMOND Rivet, MD;  Location: [facility];  Service: Endoscopy;  Laterality: N/A;   ESOPHAGOGASTRODUODENOSCOPY (EGD) WITH PROPOFOL  N/A 09/16/2014   Procedure: ESOPHAGOGASTRODUODENOSCOPY (EGD) WITH PROPOFOL ;  Surgeon: Claudis RAYMOND Rivet, MD;  Location: [facility];  Service: Endoscopy;  Laterality: N/A;   HEMOSTASIS CLIP PLACEMENT  08/07/2022   Procedure: HEMOSTASIS CLIP PLACEMENT;  Surgeon: Eartha Angelia Sieving,  MD;  Location: [facility];  Service: Gastroenterology;;   LAPAROSCOPIC APPENDECTOMY N/A 07/14/2020   Procedure: APPENDECTOMY LAPAROSCOPIC;  Surgeon: Kallie Manuelita BROCKS, MD;  Location: [facility];  Service: General;  Laterality: N/A;   MOUTH SURGERY     POLYPECTOMY  11/20/2018   Procedure: POLYPECTOMY;  Surgeon: Rivet Claudis RAYMOND, MD;  Location: [facility];  Service: Endoscopy;;  colon   POLYPECTOMY  08/07/2022   Procedure: POLYPECTOMY;  Surgeon: Eartha Angelia Sieving, MD;  Location: [facility];  Service: Gastroenterology;;   REVERSE SHOULDER ARTHROPLASTY Right 06/12/2023   Procedure: REVERSE SHOULDER ARTHROPLASTY;  Surgeon: Cristy Bonner DASEN, MD;  Location: [facility];  Service: Orthopedics;  Laterality: Right;   Skin graft to gum Right 08/2013   SUBMUCOSAL LIFTING INJECTION  08/07/2022   Procedure: SUBMUCOSAL LIFTING INJECTION;  Surgeon: Eartha Angelia Sieving, MD;  Location: [facility];  Service: Gastroenterology;;   TONSILLECTOMY AND ADENOIDECTOMY     TOTAL ABDOMINAL HYSTERECTOMY      Home Medications:  Allergies as of 03/23/2024       Reactions   Codeine Nausea And Vomiting   Lamictal [lamotrigine] Rash        Medication List        Accurate as of March 22, 2024  6:41 AM. If you have any questions, ask your nurse or doctor.          aspirin  EC 81 MG tablet Take 81 mg  by mouth daily. Swallow whole.   atorvastatin  20 MG tablet Commonly known as: LIPITOR Take 20 mg by mouth at bedtime.   benzonatate  100 MG capsule Commonly known as: TESSALON  Take 1 capsule (100 mg total) by mouth every 8 (eight) hours.   calcium  carbonate 500 MG chewable tablet Commonly known as: TUMS - dosed in mg elemental calcium  Chew 1 tablet by mouth daily as needed for indigestion or heartburn.   CALCIUM /VITAMIN D  PO Take 1 tablet by mouth 2 (two) times daily. 600 mg / 800 units   cholecalciferol 25 MCG (1000 UNIT) tablet Commonly known as: VITAMIN D3 Take 1,000 Units by mouth 2 (two)  times daily.   clonazePAM  0.5 MG tablet Commonly known as: KLONOPIN  Take 1 tablet (0.5 mg total) by mouth 2 (two) times daily.   divalproex  500 MG DR tablet Commonly known as: DEPAKOTE  Take 2 tablets (1,000 mg total) by mouth daily.   Gemtesa  75 MG Tabs Generic drug: Vibegron  Take 1 tablet (75 mg total) by mouth daily.   guaiFENesin -dextromethorphan  100-10 MG/5ML syrup Commonly known as: ROBITUSSIN DM Take 5 mLs by mouth every 4 (four) hours as needed for cough.   lacosamide  200 MG Tabs tablet Commonly known as: VIMPAT  Take 1 tablet (200 mg total) by mouth 2 (two) times daily.   multivitamin capsule Take 1 capsule by mouth daily.   omeprazole  40 MG capsule Commonly known as: PRILOSEC TAKE 1 CAPSULE DAILY (NEED OFFICE VISIT)   senna 8.6 MG tablet Commonly known as: SENOKOT Take 2 tablets by mouth daily.        Allergies:  Allergies  Allergen Reactions   Codeine Nausea And Vomiting   Lamictal [Lamotrigine] Rash    Family History  Problem Relation Age of Onset   High Cholesterol Mother    High blood pressure Mother    Diabetes Father     Social History:  reports that she quit smoking about 17 years ago. Her smoking use included cigarettes. She started smoking about 32 years ago. She has a 22.5 pack-year smoking history. She has never used smokeless tobacco. She reports that she does not drink alcohol and does not use drugs.  ROS: A complete review of systems was performed.  All systems are negative except for pertinent findings as noted.  Physical Exam:  Vital signs in last 24 hours: There were no vitals taken for this visit. Constitutional:  Alert and oriented, No acute distress Cardiovascular: Regular rate  Respiratory: Normal respiratory effort Neurologic: Grossly intact, no focal deficits Psychiatric: Normal mood and affect  I have reviewed prior pt notes  I have reviewed urinalysis results  I have independently reviewed prior imaging--bladder  scan.  Negligible residual urine volume today

## 2024-03-23 ENCOUNTER — Ambulatory Visit: Payer: Medicare Other | Admitting: Urology

## 2024-03-29 ENCOUNTER — Other Ambulatory Visit (HOSPITAL_COMMUNITY): Payer: Self-pay | Admitting: Internal Medicine

## 2024-03-29 DIAGNOSIS — Z1231 Encounter for screening mammogram for malignant neoplasm of breast: Secondary | ICD-10-CM

## 2024-04-12 ENCOUNTER — Ambulatory Visit (HOSPITAL_COMMUNITY)
Admission: RE | Admit: 2024-04-12 | Discharge: 2024-04-12 | Disposition: A | Discharge status: Home / Self Care | Source: Ambulatory Visit | Attending: Internal Medicine | Admitting: Internal Medicine

## 2024-04-12 DIAGNOSIS — Z1231 Encounter for screening mammogram for malignant neoplasm of breast: Secondary | ICD-10-CM | POA: Insufficient documentation

## 2024-04-17 ENCOUNTER — Other Ambulatory Visit: Payer: Self-pay | Admitting: Neurology

## 2024-04-19 ENCOUNTER — Other Ambulatory Visit: Payer: Self-pay

## 2024-05-24 DIAGNOSIS — J069 Acute upper respiratory infection, unspecified: Secondary | ICD-10-CM | POA: Insufficient documentation

## 2024-05-25 ENCOUNTER — Encounter (HOSPITAL_COMMUNITY): Payer: Self-pay | Admitting: Emergency Medicine

## 2024-05-25 ENCOUNTER — Emergency Department (HOSPITAL_COMMUNITY)

## 2024-05-25 ENCOUNTER — Other Ambulatory Visit: Payer: Self-pay

## 2024-05-25 ENCOUNTER — Emergency Department (HOSPITAL_COMMUNITY)
Admission: EM | Admit: 2024-05-25 | Discharge: 2024-05-25 | Disposition: A | Discharge status: Home / Self Care | Attending: Emergency Medicine | Admitting: Emergency Medicine

## 2024-05-25 DIAGNOSIS — Z7982 Long term (current) use of aspirin: Secondary | ICD-10-CM | POA: Insufficient documentation

## 2024-05-25 DIAGNOSIS — S4992XA Unspecified injury of left shoulder and upper arm, initial encounter: Secondary | ICD-10-CM | POA: Diagnosis present

## 2024-05-25 DIAGNOSIS — S80212A Abrasion, left knee, initial encounter: Secondary | ICD-10-CM | POA: Diagnosis not present

## 2024-05-25 DIAGNOSIS — W06XXXA Fall from bed, initial encounter: Secondary | ICD-10-CM | POA: Insufficient documentation

## 2024-05-25 DIAGNOSIS — S42032A Displaced fracture of lateral end of left clavicle, initial encounter for closed fracture: Secondary | ICD-10-CM | POA: Diagnosis not present

## 2024-05-25 HISTORY — DX: Encephalopathy, unspecified: G93.40

## 2024-05-25 HISTORY — DX: Acute kidney failure, unspecified: N17.9

## 2024-05-25 HISTORY — DX: Unspecified intellectual disabilities: F79

## 2024-05-25 HISTORY — DX: Encounter for palliative care: Z51.5

## 2024-05-25 HISTORY — DX: Myoclonus: G25.3

## 2024-05-25 MED ORDER — HYDROCODONE-ACETAMINOPHEN 5-325 MG PO TABS: 1.0000 | ORAL_TABLET | Freq: Four times a day (QID) | ORAL | 0 refills | Status: DC | PRN

## 2024-05-25 MED ORDER — HYDROCODONE-ACETAMINOPHEN 5-325 MG PO TABS
1.0000 | ORAL_TABLET | Freq: Once | ORAL | Status: AC
  Administered 2024-05-25: 1 via ORAL
  Filled 2024-05-25: qty 1

## 2024-05-25 MED ORDER — HYDROCODONE-ACETAMINOPHEN 5-325 MG PO TABS: 1.0000 | ORAL_TABLET | Freq: Four times a day (QID) | ORAL | 0 refills | Status: AC | PRN

## 2024-05-25 NOTE — ED Notes (Signed)
 Pt given warm blanket and non slip socks- pt family at bedside.

## 2024-05-25 NOTE — ED Provider Notes (Signed)
 "  [place] EMERGENCY DEPARTMENT AT [facility]  Provider Note  CSN: [id number] Arrival date & time: 05/25/24 0031  History Chief Complaint  Patient presents with   Shoulder Injury    Maria [name] is a 63 y.o. female brought to the ED via EMS from home with sister who is primary caregiver. Patient had an unwitnessed fall from bed earlier tonight injuring L shoulder. No reported head injury or LOC/confusion. Also has an abrasion on L knee.    Home Medications Prior to Admission medications  Medication Sig Start Date End Date Taking? Authorizing Provider  HYDROcodone -acetaminophen  (NORCO/VICODIN) 5-325 MG tablet Take 1 tablet by mouth every 6 (six) hours as needed for severe pain (pain score 7-10). 05/25/24  Yes Roselyn Carlin NOVAK, MD  aspirin  EC 81 MG tablet Take 81 mg by mouth daily. Swallow whole.    [provider]  atorvastatin  (LIPITOR) 20 MG tablet Take 20 mg by mouth at bedtime. 08/24/18   [provider]  benzonatate  (TESSALON ) 100 MG capsule Take 1 capsule (100 mg total) by mouth every 8 (eight) hours. Patient not taking: Reported on 12/23/2023 11/21/23   Emil Share, DO  Calcium  Carb-Cholecalciferol (CALCIUM /VITAMIN D  PO) Take 1 tablet by mouth 2 (two) times daily. 600 mg / 800 units    [provider]  calcium  carbonate (TUMS - DOSED IN MG ELEMENTAL CALCIUM ) 500 MG chewable tablet Chew 1 tablet by mouth daily as needed for indigestion or heartburn.    [provider]  cholecalciferol (VITAMIN D3) 25 MCG (1000 UNIT) tablet Take 1,000 Units by mouth 2 (two) times daily.    [provider]  clonazePAM  (KLONOPIN ) 0.5 MG tablet Take 1 tablet (0.5 mg total) by mouth 2 (two) times daily. 12/22/23 06/19/24  Gregg Lek, MD  DEPAKOTE  500 MG DR tablet TAKE 2 TABLETS DAILY 04/19/24   Camara, Amadou, MD  guaiFENesin -dextromethorphan  (ROBITUSSIN DM) 100-10 MG/5ML syrup Take 5 mLs by mouth every 4 (four) hours as needed for cough.  11/24/23   Bryn Bernardino NOVAK, MD  HYDROcodone -acetaminophen  (NORCO/VICODIN) 5-325 MG tablet Take 1 tablet by mouth every 6 (six) hours as needed for severe pain (pain score 7-10). 05/25/24  Yes Roselyn Carlin NOVAK, MD  lacosamide  (VIMPAT ) 200 MG TABS tablet Take 1 tablet (200 mg total) by mouth 2 (two) times daily. 02/23/24   Gregg Lek, MD  Multiple Vitamin (MULTIVITAMIN) capsule Take 1 capsule by mouth daily.    [provider]  omeprazole  (PRILOSEC) 40 MG capsule TAKE 1 CAPSULE DAILY (NEED OFFICE VISIT) 10/30/23   Eartha Flavors, Toribio, MD  senna (SENOKOT) 8.6 MG tablet Take 2 tablets by mouth daily.    [provider]  Vibegron  (GEMTESA ) 75 MG TABS Take 1 tablet (75 mg total) by mouth daily. 08/05/23   Matilda Senior, MD  QUEtiapine  (SEROQUEL ) 25 MG tablet Take 1 tablet (25 mg total) by mouth 2 (two) times daily. 05/31/20 06/06/20  Angiulli, Toribio PARAS, PA-C     Allergies    Codeine and Lamictal [lamotrigine]   Review of Systems   Review of Systems Please see HPI for pertinent positives and negatives  Physical Exam BP (!) 152/80 (BP Location: Right Arm)   Pulse 93   Temp 98.5 F (36.9 C) (Oral)   Resp 16   Ht 5' 6 (1.676 m)   Wt 61.9 kg   SpO2 97%   BMI 22.03 kg/m   Physical Exam Vitals and nursing note reviewed.  HENT:  Head: Normocephalic.     Nose: Nose normal.  Eyes:     Extraocular Movements: Extraocular movements intact.  Cardiovascular:     Pulses: Normal pulses.  Pulmonary:     Effort: Pulmonary effort is normal.  Musculoskeletal:        General: Tenderness (L distal clavicle) and deformity present.     Cervical back: Neck supple.  Skin:    Findings: No rash (on exposed skin).     Comments: Abrasion L knee  Neurological:     Mental Status: She is alert and oriented to person, place, and time.  Psychiatric:        Mood and Affect: Mood normal.     ED Results / Procedures / Treatments    EKG None  Procedures Procedures  Medications Ordered in the ED Medications  HYDROcodone -acetaminophen  (NORCO/VICODIN) 5-325 MG per tablet 1 tablet (1 tablet Oral Given 05/25/24 0142)    Initial Impression and Plan  Patient here with L shoulder injury from mechanical fall. I personally viewed the images from radiology studies and agree with radiologist interpretation: Xray shows a distal clavicle fracture with some displacement. Will place in sling, give Rx for pain medication and recommend outpatient ortho follow up.   ED Course       MDM Rules/Calculators/A&P Medical Decision Making Problems Addressed: Closed displaced fracture of acromial end of left clavicle, initial encounter: acute illness or injury  Amount and/or Complexity of Data Reviewed Radiology: ordered and independent interpretation performed. Decision-making details documented in ED Course.  Risk Prescription drug management.     Final Clinical Impression(s) / ED Diagnoses Final diagnoses:  Closed displaced fracture of acromial end of left clavicle, initial encounter    Rx / DC Orders ED Discharge Orders          Ordered    HYDROcodone -acetaminophen  (NORCO/VICODIN) 5-325 MG tablet  Every 6 hours PRN        05/25/24 0142    HYDROcodone -acetaminophen  (NORCO/VICODIN) 5-325 MG tablet  Every 6 hours PRN        05/25/24 0143             Roselyn Carlin NOVAK, MD 05/25/24 0143  "

## 2024-05-25 NOTE — ED Triage Notes (Signed)
 Pt c/o left shoulder pain after falling out of bed tonight.

## 2024-05-26 MED FILL — Hydrocodone-Acetaminophen Tab 5-325 MG: ORAL | Qty: 6 | Status: AC

## 2024-05-30 ENCOUNTER — Emergency Department (HOSPITAL_COMMUNITY)

## 2024-05-30 ENCOUNTER — Other Ambulatory Visit: Payer: Self-pay

## 2024-05-30 ENCOUNTER — Emergency Department (HOSPITAL_COMMUNITY)
Admission: EM | Admit: 2024-05-30 | Discharge: 2024-05-31 | Disposition: A | Attending: Emergency Medicine | Admitting: Emergency Medicine

## 2024-05-30 ENCOUNTER — Encounter (HOSPITAL_COMMUNITY): Payer: Self-pay

## 2024-05-30 DIAGNOSIS — M25512 Pain in left shoulder: Secondary | ICD-10-CM | POA: Diagnosis not present

## 2024-05-30 DIAGNOSIS — Y92019 Unspecified place in single-family (private) house as the place of occurrence of the external cause: Secondary | ICD-10-CM | POA: Diagnosis not present

## 2024-05-30 DIAGNOSIS — W19XXXA Unspecified fall, initial encounter: Secondary | ICD-10-CM

## 2024-05-30 DIAGNOSIS — S060XAA Concussion with loss of consciousness status unknown, initial encounter: Secondary | ICD-10-CM | POA: Diagnosis not present

## 2024-05-30 DIAGNOSIS — Z79899 Other long term (current) drug therapy: Secondary | ICD-10-CM | POA: Diagnosis not present

## 2024-05-30 DIAGNOSIS — W01198A Fall on same level from slipping, tripping and stumbling with subsequent striking against other object, initial encounter: Secondary | ICD-10-CM | POA: Diagnosis not present

## 2024-05-30 DIAGNOSIS — S0990XA Unspecified injury of head, initial encounter: Secondary | ICD-10-CM | POA: Diagnosis present

## 2024-05-30 DIAGNOSIS — Z7982 Long term (current) use of aspirin: Secondary | ICD-10-CM | POA: Insufficient documentation

## 2024-05-30 DIAGNOSIS — S0011XA Contusion of right eyelid and periocular area, initial encounter: Secondary | ICD-10-CM | POA: Diagnosis not present

## 2024-05-30 NOTE — ED Triage Notes (Signed)
 Pt from home with fall. - Blood thinners. Pt c/o left shoulder pain and head pain. Large hematoma over right eye noted.

## 2024-05-30 NOTE — ED Provider Notes (Signed)
 " [facility] Provider Note   CSN: [id number] Arrival date & time: 05/30/24  2245     Patient presents with: Maria [name] is a 63 y.o. female.  {Add pertinent medical, surgical, social history, OB history to YEP:67052} The history is provided by the patient. The history is limited by the condition of the patient.  Fall  Patient with a history of seizures, recent clavicle injury, cognitive delay presents with a fall. EMS reports patient fell at home striking her head with a hematoma above her right eye.  She is not on anticoagulation She recently had a fall and sustained a left clavicle injury.  Patient reports pain throughout her body. 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   CAP (community acquired pneumonia) 11/21/2023   Chest pain 03/16/2014   CLOSED FRACTURE OF ACROMIAL END OF CLAVICLE 07/13/2008   Qualifier: Diagnosis of   By: Margrette MD, Stanley         Cognitive communication disorder 11/13/2015   Constipation    Coordination problem 11/13/2015   Debility 05/05/2020   Disorder of brain 11/13/2015   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   DNR (do not resuscitate) discussion 10/06/2012   Dysphagia    Dysuria 03/13/2021   ESBL (extended spectrum beta-lactamase) producing bacteria infection 05/12/2020   Fall 11/09/2015   Fracture    R foot   GERD (gastroesophageal reflux disease)    Headache    Heart failure (HCC) 10/24/2020   Herpes labialis 11/28/2023   History of colonic polyps 08/07/2022   History of shingles 10/2016   Intellectual disability    Mild     Mental  retardation    lesion in head   Myoclonus    Overactive bladder 10/24/2020   Pain in right shoulder 08/24/2021   Palliative care by specialist    Pneumonia    Positive colorectal cancer screening using Cologuard test 10/28/2018   Added automatically from request for surgery [id number]     Renal abscess, left 04/14/2020   Seizures (HCC)    Last seizure 05/31/23   Thrombocytopenia    Tremor     Prior to Admission medications  Medication Sig Start Date End Date Taking? Authorizing Provider  aspirin  EC 81 MG tablet Take 81 mg by mouth daily. Swallow whole.    [provider]  atorvastatin  (LIPITOR) 20 MG tablet Take 20 mg by mouth at bedtime. 08/24/18   [provider]  Calcium  Carb-Cholecalciferol (CALCIUM /VITAMIN D  PO) Take 1 tablet by mouth 2 (two) times daily. 600 mg / 800 units    [provider]  calcium  carbonate (TUMS - DOSED IN MG ELEMENTAL CALCIUM ) 500 MG chewable tablet Chew 1 tablet by mouth daily as needed for indigestion or heartburn.    [provider]  cholecalciferol (VITAMIN D3) 25 MCG (1000 UNIT) tablet Take 1,000 Units by mouth 2 (two) times daily.    [provider]  clonazePAM  (KLONOPIN ) 0.5 MG tablet Take 1 tablet (0.5 mg total) by mouth 2 (two) times daily. 12/22/23 06/19/24  Gregg Lek, MD  DEPAKOTE  500 MG DR tablet TAKE 2  TABLETS DAILY 04/19/24   Camara, Amadou, MD  HYDROcodone -acetaminophen  (NORCO/VICODIN) 5-325 MG tablet Take 1 tablet by mouth every 6 (six) hours as needed for severe pain (pain score 7-10). 05/25/24   Roselyn Carlin NOVAK, MD  lacosamide  (VIMPAT ) 200 MG TABS tablet Take 1 tablet (200 mg total) by mouth 2 (two) times daily. 02/23/24   Gregg Lek, MD  Multiple Vitamin (MULTIVITAMIN) capsule Take 1 capsule by mouth daily.    [provider]  omeprazole  (PRILOSEC) 40 MG capsule TAKE 1 CAPSULE DAILY (NEED OFFICE VISIT) 10/30/23   Eartha Flavors, Toribio, MD  senna (SENOKOT) 8.6 MG tablet Take 2  tablets by mouth daily.    [provider]  Vibegron  (GEMTESA ) 75 MG TABS Take 1 tablet (75 mg total) by mouth daily. 08/05/23   Matilda Senior, MD  QUEtiapine  (SEROQUEL ) 25 MG tablet Take 1 tablet (25 mg total) by mouth 2 (two) times daily. 05/31/20 06/06/20  Angiulli, Toribio PARAS, PA-C    Allergies: Codeine and Lamictal [lamotrigine]    Review of Systems  Unable to perform ROS: Mental status change    Updated Vital Signs BP (!) 131/90 (BP Location: Right Arm)   Pulse 75   Temp 98.4 F (36.9 C) (Oral)   Resp 20   SpO2 97%   Physical Exam CONSTITUTIONAL: Chronically ill-appearing HEAD: Hematoma above the right eye, no laceration EYES: EOMI/PERRL, no proptosis ENMT: Mucous membranes moist SPINE/BACK: Diffuse cervical spine tenderness No bruising/crepitance/stepoffs noted to spine CV: S1/S2 noted, no murmurs/rubs/gallops noted LUNGS: Lungs are clear to auscultation bilaterally, no apparent distress ABDOMEN: soft, nontender NEURO: Pt is awake/alert, moves all extremitiesx4.  Tearful, appears confused EXTREMITIES: pulses normal/equal, full ROM Tenderness and bruising noted to the left clavicle All other extremities/joints palpated/ranged and nontender SKIN: Chronic petechiae noted PSYCH: Tearful  (all labs ordered are listed, but only abnormal results are displayed) Labs Reviewed  COMPREHENSIVE METABOLIC PANEL WITH GFR  CBC  URINALYSIS, ROUTINE W REFLEX MICROSCOPIC  VALPROIC  ACID LEVEL  AMMONIA  CBG MONITORING, ED    EKG: None  Radiology: No results found.  {Document cardiac monitor, telemetry assessment procedure when appropriate:32947} Procedures   Medications Ordered in the ED - No data to display    {Click here for ABCD2, HEART and other calculators REFRESH Note before signing:1}                              Medical Decision Making Amount and/or Complexity of Data Reviewed Labs: ordered. Radiology: ordered.   This patient presents to the ED for  concern of head injury, this involves an extensive number of treatment options, and is a complaint that carries with it a high risk of complications and morbidity.  The differential diagnosis includes but is not limited to subdural hematoma, subarachnoid hemorrhage, skull fracture, concussion   Comorbidities that complicate the patient evaluation: Patients presentation is complicated by their history of seizures  Social Determinants of Health: Patients cognitive delay  increases the complexity of managing their presentation  Additional history obtained: Additional history obtained from family Records reviewed previous admission documents  Lab Tests: I Ordered, and personally interpreted labs.  The pertinent results include:  ***  Imaging Studies ordered: I ordered imaging studies including CT scan head, C-spine,face  I independently visualized and interpreted imaging which showed *** I agree with the radiologist interpretation  Cardiac Monitoring: The patient was maintained on a cardiac monitor.  I personally viewed and interpreted the cardiac  monitor which showed an underlying rhythm of:  {cardiac monitor:26849}  Medicines ordered and prescription drug management: I ordered medication including ***  for ***  Reevaluation of the patient after these medicines showed that the patient    {resolved/improved/worsened:23923::improved}  Test Considered: Patient is low risk / negative by ***, therefore do not feel that *** is indicated.  Critical Interventions:  ***  Consultations Obtained: I requested consultation with the {consultation:26851}, and discussed  findings as well as pertinent plan - they recommend: ***  Reevaluation: After the interventions noted above, I reevaluated the patient and found that they have :{resolved/improved/worsened:23923::improved}  Complexity of problems addressed: Patients presentation is most consistent with  acute presentation with potential  threat to life or bodily function  Disposition: After consideration of the diagnostic results and the patients response to treatment,  I feel that the patent would benefit from {disposition:26850}.     {Document critical care time when appropriate  Document review of labs and clinical decision tools ie CHADS2VASC2, etc  Document your independent review of radiology images and any outside records  Document your discussion with family members, caretakers and with consultants  Document social determinants of health affecting pt's care  Document your decision making why or why not admission, treatments were needed:32947:::1}   Final diagnoses:  None    ED Discharge Orders     None        "

## 2024-05-31 ENCOUNTER — Telehealth: Payer: Self-pay | Admitting: Neurology

## 2024-05-31 DIAGNOSIS — S060XAA Concussion with loss of consciousness status unknown, initial encounter: Secondary | ICD-10-CM | POA: Diagnosis not present

## 2024-05-31 LAB — COMPREHENSIVE METABOLIC PANEL WITH GFR
ALT: 43 U/L (ref 0–44)
AST: 41 U/L (ref 15–41)
Albumin: 4.4 g/dL (ref 3.5–5.0)
Alkaline Phosphatase: 111 U/L (ref 38–126)
Anion gap: 14 (ref 5–15)
BUN: 27 mg/dL — ABNORMAL HIGH (ref 8–23)
CO2: 23 mmol/L (ref 22–32)
Calcium: 9.6 mg/dL (ref 8.9–10.3)
Chloride: 102 mmol/L (ref 98–111)
Creatinine, Ser: 1.01 mg/dL — ABNORMAL HIGH (ref 0.44–1.00)
GFR, Estimated: >60 mL/min
Glucose, Bld: 132 mg/dL — ABNORMAL HIGH (ref 70–99)
Potassium: 4.6 mmol/L (ref 3.5–5.1)
Sodium: 139 mmol/L (ref 135–145)
Total Bilirubin: 0.4 mg/dL (ref 0.0–1.2)
Total Protein: 7 g/dL (ref 6.5–8.1)

## 2024-05-31 LAB — CBC
HCT: 37.2 % (ref 36.0–46.0)
Hemoglobin: 12.5 g/dL (ref 12.0–15.0)
MCH: 31.1 pg (ref 26.0–34.0)
MCHC: 33.6 g/dL (ref 30.0–36.0)
MCV: 92.5 fL (ref 80.0–100.0)
Platelets: 182 K/uL (ref 150–400)
RBC: 4.02 MIL/uL (ref 3.87–5.11)
RDW: 13.9 % (ref 11.5–15.5)
WBC: 6.5 K/uL (ref 4.0–10.5)
nRBC: 0 % (ref 0.0–0.2)

## 2024-05-31 LAB — VALPROIC ACID LEVEL: Valproic Acid Lvl: 59 ug/mL (ref 50–100)

## 2024-05-31 LAB — AMMONIA: Ammonia: 18 umol/L (ref 9–35)

## 2024-05-31 LAB — CBG MONITORING, ED: Glucose-Capillary: 134 mg/dL — ABNORMAL HIGH (ref 70–99)

## 2024-05-31 MED ORDER — HYDROCODONE-ACETAMINOPHEN 5-325 MG PO TABS
1.0000 | ORAL_TABLET | Freq: Once | ORAL | Status: AC
  Administered 2024-05-31: 1 via ORAL
  Filled 2024-05-31: qty 1

## 2024-05-31 NOTE — Telephone Encounter (Signed)
 Pt's sister has called for an appointment as a result of an Increase in seizure activity.

## 2024-05-31 NOTE — H&P (Signed)
 PREOPERATIVE H&P  Chief Complaint: Closed displaced fracture of shaft of left clavicle  HPI: Maria [name] is a 63 y.o. female who presents with a diagnosis of Closed displaced fracture of shaft of left clavicle. Symptoms are rated as moderate to severe, and have been worsening.  This is significantly impairing activities of daily living.  She has elected for surgical management.   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   CAP (community acquired pneumonia) 11/21/2023   Chest pain 03/16/2014   CLOSED FRACTURE OF ACROMIAL END OF CLAVICLE 07/13/2008   Qualifier: Diagnosis of   By: Margrette MD, Stanley         Cognitive communication disorder 11/13/2015   Constipation    Coordination problem 11/13/2015   Debility 05/05/2020   Disorder of brain 11/13/2015   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   DNR (do not resuscitate) discussion 10/06/2012   Dysphagia    Dysuria 03/13/2021   ESBL (extended spectrum beta-lactamase) producing bacteria infection 05/12/2020   Fall 11/09/2015   Fracture    R foot   GERD (gastroesophageal reflux disease)    Headache    Heart failure (HCC) 10/24/2020   Herpes labialis 11/28/2023   History of colonic polyps 08/07/2022   History of shingles 10/2016   Intellectual disability    Mild     Mental retardation    lesion in head   Myoclonus    Overactive bladder 10/24/2020   Pain in right shoulder 08/24/2021   Palliative care by specialist    Pneumonia    Positive colorectal cancer screening using Cologuard test 10/28/2018   Added automatically from request for surgery [id number]     Renal abscess, left 04/14/2020   Seizures (HCC)    Last seizure 05/31/23   Thrombocytopenia    Tremor     Past Surgical History:  Procedure Laterality Date   BIOPSY  09/16/2014   Procedure: BIOPSY;  Surgeon: Claudis RAYMOND Rivet, MD;  Location: [facility];  Service: Endoscopy;;   CATARACT EXTRACTION     both eyes, May of 2015   COLONOSCOPY     COLONOSCOPY WITH PROPOFOL  N/A 11/20/2018   Procedure: COLONOSCOPY WITH PROPOFOL ;  Surgeon: Rivet Claudis RAYMOND, MD;  Location: [facility];  Service: Endoscopy;  Laterality: N/A;   COLONOSCOPY WITH PROPOFOL  N/A 08/07/2022   Procedure: COLONOSCOPY WITH PROPOFOL ;  Surgeon: Eartha Angelia Sieving, MD;  Location: [facility];  Service: Gastroenterology;  Laterality: N/A;  7:30am, asa 1-2   ESOPHAGEAL DILATION N/A 09/16/2014   Procedure: ESOPHAGEAL DILATION WITH 54FR MALONEY DILATOR;  Surgeon: Claudis RAYMOND Rivet, MD;  Location: [facility];  Service: Endoscopy;  Laterality: N/A;   ESOPHAGOGASTRODUODENOSCOPY (EGD) WITH PROPOFOL  N/A 09/16/2014   Procedure: ESOPHAGOGASTRODUODENOSCOPY (EGD) WITH PROPOFOL ;  Surgeon: Claudis RAYMOND Rivet, MD;  Location: [facility];  Service: Endoscopy;  Laterality: N/A;   HEMOSTASIS CLIP PLACEMENT  08/07/2022   Procedure: HEMOSTASIS CLIP PLACEMENT;  Surgeon: Eartha Angelia Sieving, MD;  Location: [facility];  Service: Gastroenterology;;   LAPAROSCOPIC APPENDECTOMY N/A 07/14/2020   Procedure: APPENDECTOMY LAPAROSCOPIC;  Surgeon: Kallie Manuelita BROCKS, MD;  Location: [facility];  Service: General;  Laterality: N/A;   MOUTH SURGERY     POLYPECTOMY  11/20/2018   Procedure: POLYPECTOMY;  Surgeon:  Golda Claudis PENNER, MD;  Location: [facility];  Service: Endoscopy;;  colon   POLYPECTOMY  08/07/2022   Procedure: POLYPECTOMY;  Surgeon: Eartha Angelia Sieving, MD;  Location: [facility];  Service: Gastroenterology;;   REVERSE SHOULDER ARTHROPLASTY Right 06/12/2023   Procedure: REVERSE SHOULDER ARTHROPLASTY;  Surgeon: Cristy Bonner DASEN, MD;  Location: [facility];  Service: Orthopedics;  Laterality: Right;   Skin graft to gum Right 08/2013   SUBMUCOSAL LIFTING INJECTION   08/07/2022   Procedure: SUBMUCOSAL LIFTING INJECTION;  Surgeon: Eartha Angelia Sieving, MD;  Location: [facility];  Service: Gastroenterology;;   TONSILLECTOMY AND ADENOIDECTOMY     TOTAL ABDOMINAL HYSTERECTOMY     Social History   Socioeconomic History   Marital status: Single    Spouse name: Not on file   Number of children: 0   Years of education: 10   Highest education level: Not on file  Occupational History    Employer: UNEMPLOYED  Tobacco Use   Smoking status: Former    Current packs/day: 0.00    Average packs/day: 1.5 packs/day for 15.0 years (22.5 ttl pk-yrs)    Types: Cigarettes    Start date: 05/23/1991    Quit date: 05/22/2006    Years since quitting: 18.0   Smokeless tobacco: Never  Vaping Use   Vaping status: Never Used  Substance and Sexual Activity   Alcohol use: No    Alcohol/week: 0.0 standard drinks of alcohol   Drug use: No   Sexual activity: Never    Birth control/protection: Other-see comments    Comment: Hysterectomy  Other Topics Concern   Not on file  Social History Narrative   Patient is single and lives with her mother Rosezella)      Patient drinks 3 cups of caffeine daily.   Patient is right handed.         Social Drivers of Health   Tobacco Use: Medium Risk (05/30/2024)   Patient History    Smoking Tobacco Use: Former    Smokeless Tobacco Use: Never    Passive Exposure: Not on file  Financial Resource Strain: Not on file  Food Insecurity: Unknown (11/25/2023)   Epic    Worried About Programme Researcher, Broadcasting/film/video in the Last Year: Not on file    [employer] of Food in the Last Year: Never true  Transportation Needs: No Transportation Needs (11/25/2023)   Epic    Lack of Transportation (Medical): No    Lack of Transportation (Non-Medical): No  Physical Activity: Not on file  Stress: Not on file  Social Connections: Not on file  Depression (PHQ2-9): Low Risk (12/02/2023)   Depression (PHQ2-9)    PHQ-2 Score: 0  Alcohol Screen: Not on file   Housing: Unknown (11/25/2023)   Epic    Unable to Pay for Housing in the Last Year: No    Number of Times Moved in the Last Year: Not on file    Homeless in the Last Year: No  Utilities: Not At Risk (11/25/2023)   Epic    Threatened with loss of utilities: No  Health Literacy: Not on file   Family History  Problem Relation Age of Onset   High Cholesterol Mother    High blood pressure Mother    Diabetes Father    Allergies[1] Prior to Admission medications  Medication Sig Start Date End Date Taking? Authorizing Provider  aspirin  EC 81 MG tablet Take 81 mg by mouth daily. Swallow whole.    [provider]  atorvastatin  (LIPITOR) 20 MG tablet Take 20 mg by mouth at bedtime. 08/24/18   [provider]  Calcium  Carb-Cholecalciferol (CALCIUM /VITAMIN D  PO) Take 1 tablet by mouth 2 (two) times daily. 600 mg / 800 units    [provider]  calcium  carbonate (TUMS - DOSED IN MG ELEMENTAL CALCIUM ) 500 MG chewable tablet Chew 1 tablet by mouth daily as needed for indigestion or heartburn.    [provider]  cholecalciferol (VITAMIN D3) 25 MCG (1000 UNIT) tablet Take 1,000 Units by mouth 2 (two) times daily.    [provider]  clonazePAM  (KLONOPIN ) 0.5 MG tablet Take 1 tablet (0.5 mg total) by mouth 2 (two) times daily. 12/22/23 06/19/24  Gregg Lek, MD  DEPAKOTE  500 MG DR tablet TAKE 2 TABLETS DAILY 04/19/24   Camara, Amadou, MD  HYDROcodone -acetaminophen  (NORCO/VICODIN) 5-325 MG tablet Take 1 tablet by mouth every 6 (six) hours as needed for severe pain (pain score 7-10). 05/25/24   Roselyn Carlin NOVAK, MD  lacosamide  (VIMPAT ) 200 MG TABS tablet Take 1 tablet (200 mg total) by mouth 2 (two) times daily. 02/23/24   Gregg Lek, MD  Multiple Vitamin (MULTIVITAMIN) capsule Take 1 capsule by mouth daily.    [provider]  omeprazole  (PRILOSEC) 40 MG capsule TAKE 1 CAPSULE DAILY (NEED OFFICE VISIT) 10/30/23   Eartha Flavors, Toribio, MD   senna (SENOKOT) 8.6 MG tablet Take 2 tablets by mouth daily.    [provider]  Vibegron  (GEMTESA ) 75 MG TABS Take 1 tablet (75 mg total) by mouth daily. 08/05/23   Matilda Senior, MD  QUEtiapine  (SEROQUEL ) 25 MG tablet Take 1 tablet (25 mg total) by mouth 2 (two) times daily. 05/31/20 06/06/20  Angiulli, Toribio PARAS, PA-C     Positive ROS: All other systems have been reviewed and were otherwise negative with the exception of those mentioned in the HPI and as above.  Physical Exam: General: Alert, no acute distress Cardiovascular: No pedal edema Respiratory: No cyanosis, no use of accessory musculature GI: No organomegaly, abdomen is soft and non-tender Skin: No lesions in the area of chief complaint Neurologic: Sensation intact distally Psychiatric: Patient is competent for consent with normal mood and affect Lymphatic: No axillary or cervical lymphadenopathy  MUSCULOSKELETAL: TTP left clavicle, visible deformity present, no skin tenting, shoulder ROM limited d/t pain, NVI   Imaging: xrays of the left shoulder show significant displacement of a clavicle shaft fracture in the distal third with increased spacing between the coracoid process and thus torn CC ligaments   Assessment: Closed displaced fracture of shaft of left clavicle  Plan: Plan for Procedures: OPEN REDUCTION INTERNAL FIXATION (ORIF) CLAVICULAR FRACTURE  The risks benefits and alternatives were discussed with the patient including but not limited to the risks of nonoperative treatment, versus surgical intervention including infection, bleeding, nerve injury,  blood clots, cardiopulmonary complications, morbidity, mortality, among others, and they were willing to proceed.   Weightbearing: NWB LUE Orthopedic devices: sling Showering: POD 3 Dressing: reinforce PRN Medicines: ASA, Norco 10, Mobic , Zofran   Discharge: home Follow up: 06/18/24 at 1:15pm with me    Gerard CHRISTELLA Ted DEVONNA Office  [phone] 05/31/2024 10:01 PM        [1]  Allergies Allergen Reactions   Codeine Nausea And Vomiting   Lamictal [Lamotrigine] Rash

## 2024-06-01 NOTE — Progress Notes (Unsigned)
 "  Patient: Maria [name] Date of Birth: Aug 03, 1960  Reason for Visit: Follow up History from: Patient, sister  Primary Neurologist: Camara  ASSESSMENT AND PLAN 63 y.o. year old female   1.  Seizures -Multiple recent falls, unclear etiology? Seizure vs medication side effect? Consider if high level of Vimpat  contributing to falls? -Start Keppra  750 mg twice daily, reduce Vimpat  200 mg daily for 1 week then stop it, continue Keppra   -Continue current dosing of Depakote , Klonopin   -Valtoco  20 mg for seizure rescue -Check EEG  -Use a walker, close supervision -Follow up in 6 weeks with me, call for continued seizure activity   Meds ordered this encounter  Medications   levETIRAcetam  (KEPPRA ) 750 MG tablet    Sig: Take 1 tablet (750 mg total) by mouth 2 (two) times daily.    Dispense:  60 tablet    Refill:  5   diazePAM , 20 MG Dose, (VALTOCO  20 MG DOSE) 2 x 10 MG/0.1ML LQPK    Sig: Place 20 mg into the nose as needed. For seizure, use two 10 mg devices, 1 into each nostril    Dispense:  5 each    Refill:  1   HISTORY OF PRESENT ILLNESS: Today 06/02/2024 Update 06/02/24 SS: Here with her sister. She is now living with her sister. Her mother died in 11/25/24. In the last week, she has had more frequent falls. On 12/23, fall with left clavicle fracture, having surgery next week. On 12/24, walking with sister, collapsed, sister reported mild full body shaking, after wards speech was confused. 05/30/24 she had fallen, in the floor, had urinary incontinence, injury to right eyelid. Fall again last night. Crying today at visit in pain. Before falls, reports feels dizzy. Had recently being treated for URI with Augmentin . Denies missed doses of seizure meds, Klonopin , Depakote , Vimpat . 05/30/24 creatinine 1.01, glucose 132, Depakote  59, ammonia normal. CT head right periorbital contusion. I was able to see the patient with Dr. Gregg. Historically Vimpat  levels are high around 18.   Update 12/22/23  Dr. Gregg: Patient presents today for follow-up, last visit was in 06/25/2024since then she has been doing well, denies any seizure or seizure like activity.  She reports compliance with the medications.  She is accompanied by her brother today.  No other questions or concerns except that 51-months ago she fell and broke her shoulder.  She is currently getting physical therapy.  INTERVAL HISTORY 11/12/2022: SS Here today with her brother. Had a fall last night getting up to use the bathroom, went to urgent care. For right flank pain. Given ibuprofen , tizanidine  today.  No seizures reported. Brother mentions concern for UTI? Brother has noted some confusion. Her mother who manages her medications, is 29. The patient won't do PT. Remains on Klonopin , Depakote , Vimpat . She seems a bit more scattered today than in the past.   04/23/22 SS: Here today with her nephew. No seizures. Remains on Depakote , Klonopin , Vimpat . No falls. No changes to her health. Her mother manages her medications. Her mother is 58. EEG in April 2023 was abnormal due to the presence of frequent spike and slow wave discharges that is consistent with a generalized epileptogenic potential, also present was diffuse slowing.  No problems or concerns.  HISTORY  Dr.Camara 09/04/21: Patient present today for follow-up, she is accompanied by her mother.  Since last visit he denies any additional seizures.  She still on Depakote , Vimpat  and clonazepam .  She reported she has been having  increased falls, last fall was 2 weeks ago.  Patient denies any prodrome prior to fall, denies any weakness, no dizziness, no lightheadedness seizures falls.  Patient thinks she is not having seizures with these falls.  Per mother her seizures are described as generalized tonic-clonic seizures.   REVIEW OF SYSTEMS: Out of a complete 14 system review of symptoms, the patient complains only of the following symptoms, and all other reviewed systems are negative.  See  HPI  ALLERGIES: Allergies  Allergen Reactions   Codeine Nausea And Vomiting and Nausea Only    Other Reaction(s): Not available  codeine   Lamotrigine Rash and Dermatitis    Other Reaction(s): Systemic vasculitis (code- 53043991, SNOMED CT)    HOME MEDICATIONS: Outpatient Medications Prior to Visit  Medication Sig Dispense Refill   atorvastatin  (LIPITOR) 20 MG tablet Take 20 mg by mouth at bedtime.     Calcium  Carb-Cholecalciferol (CALCIUM /VITAMIN D  PO) Take 1 tablet by mouth 2 (two) times daily. 600 mg / 800 units     calcium  carbonate (TUMS - DOSED IN MG ELEMENTAL CALCIUM ) 500 MG chewable tablet Chew 1 tablet by mouth daily as needed for indigestion or heartburn.     cholecalciferol (VITAMIN D3) 25 MCG (1000 UNIT) tablet Take 1,000 Units by mouth 2 (two) times daily.     clonazePAM  (KLONOPIN ) 0.5 MG tablet Take 1 tablet (0.5 mg total) by mouth 2 (two) times daily. 60 tablet 5   DEPAKOTE  500 MG DR tablet TAKE 2 TABLETS DAILY 180 tablet 3   HYDROcodone -acetaminophen  (NORCO/VICODIN) 5-325 MG tablet Take 1 tablet by mouth every 6 (six) hours as needed for severe pain (pain score 7-10). 12 tablet 0   lacosamide  (VIMPAT ) 200 MG TABS tablet Take 1 tablet (200 mg total) by mouth 2 (two) times daily. 180 tablet 3   Multiple Vitamin (MULTIVITAMIN) capsule Take 1 capsule by mouth daily.     omeprazole  (PRILOSEC) 40 MG capsule TAKE 1 CAPSULE DAILY (NEED OFFICE VISIT) 90 capsule 3   senna (SENOKOT) 8.6 MG tablet Take 2 tablets by mouth daily.     Vibegron  (GEMTESA ) 75 MG TABS Take 1 tablet (75 mg total) by mouth daily.     aspirin  EC 81 MG tablet Take 81 mg by mouth daily. Swallow whole.     No facility-administered medications prior to visit.    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   CAP (community acquired pneumonia) 11/21/2023   Chest pain 03/16/2014   CLOSED FRACTURE OF ACROMIAL END OF CLAVICLE 07/13/2008   Qualifier: Diagnosis of   By: Margrette MD, Stanley         Cognitive communication disorder 11/13/2015   Constipation    Coordination problem 11/13/2015   Debility 05/05/2020   Disorder of brain 11/13/2015   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   DNR (do not resuscitate) discussion 10/06/2012   Dysphagia    Dysuria 03/13/2021   ESBL (extended spectrum beta-lactamase) producing bacteria infection 05/12/2020   Fall 11/09/2015   Fracture    R foot   GERD (gastroesophageal reflux disease)    Headache    Heart failure (HCC) 10/24/2020   Herpes labialis 11/28/2023   History of colonic polyps 08/07/2022   History of  shingles 10/2016   Intellectual disability    Mild     Mental retardation    lesion in head   Myoclonus    Overactive bladder 10/24/2020   Pain in right shoulder 08/24/2021   Palliative care by specialist    Pneumonia    Positive colorectal cancer screening using Cologuard test 10/28/2018   Added automatically from request for surgery [id number]     Renal abscess, left 04/14/2020   Seizures (HCC)    Last seizure 05/31/23   Thrombocytopenia    Tremor     PAST SURGICAL HISTORY: Past Surgical History:  Procedure Laterality Date   BIOPSY  09/16/2014   Procedure: BIOPSY;  Surgeon: Claudis RAYMOND Rivet, MD;  Location: [facility];  Service: Endoscopy;;   CATARACT EXTRACTION     both eyes, May of 2015   COLONOSCOPY     COLONOSCOPY WITH PROPOFOL  N/A 11/20/2018   Procedure: COLONOSCOPY WITH PROPOFOL ;  Surgeon: Rivet Claudis RAYMOND, MD;  Location: [facility];  Service: Endoscopy;  Laterality: N/A;   COLONOSCOPY WITH PROPOFOL  N/A 08/07/2022   Procedure: COLONOSCOPY WITH PROPOFOL ;  Surgeon: Eartha Angelia Sieving, MD;  Location: [facility];  Service: Gastroenterology;   Laterality: N/A;  7:30am, asa 1-2   ESOPHAGEAL DILATION N/A 09/16/2014   Procedure: ESOPHAGEAL DILATION WITH 54FR MALONEY DILATOR;  Surgeon: Claudis RAYMOND Rivet, MD;  Location: [facility];  Service: Endoscopy;  Laterality: N/A;   ESOPHAGOGASTRODUODENOSCOPY (EGD) WITH PROPOFOL  N/A 09/16/2014   Procedure: ESOPHAGOGASTRODUODENOSCOPY (EGD) WITH PROPOFOL ;  Surgeon: Claudis RAYMOND Rivet, MD;  Location: [facility];  Service: Endoscopy;  Laterality: N/A;   HEMOSTASIS CLIP PLACEMENT  08/07/2022   Procedure: HEMOSTASIS CLIP PLACEMENT;  Surgeon: Eartha Angelia Sieving, MD;  Location: [facility];  Service: Gastroenterology;;   LAPAROSCOPIC APPENDECTOMY N/A 07/14/2020   Procedure: APPENDECTOMY LAPAROSCOPIC;  Surgeon: Kallie Manuelita BROCKS, MD;  Location: [facility];  Service: General;  Laterality: N/A;   MOUTH SURGERY     POLYPECTOMY  11/20/2018   Procedure: POLYPECTOMY;  Surgeon: Rivet Claudis RAYMOND, MD;  Location: [facility];  Service: Endoscopy;;  colon   POLYPECTOMY  08/07/2022   Procedure: POLYPECTOMY;  Surgeon: Eartha Angelia Sieving, MD;  Location: [facility];  Service: Gastroenterology;;   REVERSE SHOULDER ARTHROPLASTY Right 06/12/2023   Procedure: REVERSE SHOULDER ARTHROPLASTY;  Surgeon: Cristy Bonner DASEN, MD;  Location: [facility];  Service: Orthopedics;  Laterality: Right;   Skin graft to gum Right 08/2013   SUBMUCOSAL LIFTING INJECTION  08/07/2022   Procedure: SUBMUCOSAL LIFTING INJECTION;  Surgeon: Eartha Angelia Sieving, MD;  Location: [facility];  Service: Gastroenterology;;   TONSILLECTOMY AND ADENOIDECTOMY     TOTAL ABDOMINAL HYSTERECTOMY      FAMILY HISTORY: Family History  Problem Relation Age of Onset   High Cholesterol Mother    High blood pressure Mother    Diabetes Father     SOCIAL HISTORY: Social History   Socioeconomic History   Marital status: Single    Spouse name: Not on file   Number of children: 0   Years of education: 10   Highest education level: Not on file  Occupational History     Employer: UNEMPLOYED  Tobacco Use   Smoking status: Former    Current packs/day: 0.00    Average packs/day: 1.5 packs/day for 15.0 years (22.5 ttl pk-yrs)    Types: Cigarettes    Start date: 05/23/1991    Quit date: 05/22/2006    Years since quitting: 18.0   Smokeless tobacco: Never  Vaping Use   Vaping status: Never Used  Substance and Sexual Activity   Alcohol use: No    Alcohol/week: 0.0 standard drinks of alcohol   Drug use: No   Sexual activity: Never    Birth control/protection: Other-see comments    Comment: Hysterectomy  Other Topics Concern   Not on file  Social History Narrative   Patient is single and lives with her mother Rosezella)      Patient drinks 3 cups of caffeine daily.   Patient is right handed.         Social Drivers of Health   Tobacco Use: Medium Risk (06/02/2024)   Patient History    Smoking Tobacco Use: Former    Smokeless Tobacco Use: Never    Passive Exposure: Not on file  Financial Resource Strain: Not on file  Food Insecurity: Unknown (11/25/2023)   Epic    Worried About Programme Researcher, Broadcasting/film/video in the Last Year: Not on file    [employer] of Food in the Last Year: Never true  Transportation Needs: No Transportation Needs (11/25/2023)   Epic    Lack of Transportation (Medical): No    Lack of Transportation (Non-Medical): No  Physical Activity: Not on file  Stress: Not on file  Social Connections: Not on file  Intimate Partner Violence: Patient Unable To Answer (11/25/2023)   Epic    Fear of Current or Ex-Partner: Patient unable to answer    Emotionally Abused: Patient unable to answer    Physically Abused: Patient unable to answer    Sexually Abused: Patient unable to answer  Depression (PHQ2-9): Low Risk (12/02/2023)   Depression (PHQ2-9)    PHQ-2 Score: 0  Alcohol Screen: Not on file  Housing: Unknown (11/25/2023)   Epic    Unable to Pay for Housing in the Last Year: No    Number of Times Moved in the Last Year: Not on file    Homeless in the  Last Year: No  Utilities: Not At Risk (11/25/2023)   Epic    Threatened with loss of utilities: No  Health Literacy: Not on file   PHYSICAL EXAM  Vitals:   06/02/24 1030  BP: (!) 158/90  Pulse: 94  Resp: 14  SpO2: 96%  Weight: 147 lb (66.7 kg)  Height: 5' 1 (1.549 m)   Body mass index is 27.78 kg/m.  Generalized: Well developed, in no acute distress  Neurological examination  Mentation: Alert, is limited historian, some cognitive impairment, follows most exam commands, in pain today, moaning, complaining, bruising to right eye Cranial nerve II-XII: Pupils were equal round reactive to light. Extraocular movements were full, visual field were full on confrontational test. Facial sensation and strength were normal. Left clavicle injury. Motor: The motor testing reveals 5 over 5 strength of all 4 extremities. Good symmetric motor tone is noted throughout.  Sensory: Sensory testing is intact to soft touch on all 4 extremities. No evidence of extinction is noted.  Coordination: Cerebellar testing reveals good finger-nose-finger and heel-to-shin bilaterally.   Gait and station: Gait cautious, looks down, tremor with right hand, wide based, antalgic Reflexes: Deep tendon reflexes are symmetric and normal bilaterally.   DIAGNOSTIC DATA (LABS, IMAGING, TESTING) - I reviewed patient records, labs, notes, testing and imaging myself where available.  Lab Results  Component Value Date   WBC 6.5 05/30/2024   HGB 12.5 05/30/2024   HCT 37.2 05/30/2024   MCV 92.5 05/30/2024   PLT 182 05/30/2024  Component Value Date/Time   NA 139 05/30/2024 2340   NA 144 12/22/2023 1613   K 4.6 05/30/2024 2340   CL 102 05/30/2024 2340   CO2 23 05/30/2024 2340   GLUCOSE 132 (H) 05/30/2024 2340   BUN 27 (H) 05/30/2024 2340   BUN 27 12/22/2023 1613   CREATININE 1.01 (H) 05/30/2024 2340   CALCIUM  9.6 05/30/2024 2340   PROT 7.0 05/30/2024 2340   PROT 6.7 12/22/2023 1613   ALBUMIN  4.4 05/30/2024  2340   ALBUMIN  4.4 12/22/2023 1613   AST 41 05/30/2024 2340   ALT 43 05/30/2024 2340   ALKPHOS 111 05/30/2024 2340   BILITOT 0.4 05/30/2024 2340   BILITOT 0.2 12/22/2023 1613   GFRNONAA >60 05/30/2024 2340   GFRAA >60 06/11/2017 1542   Lab Results  Component Value Date   CHOL  10/13/2009    169        ATP III CLASSIFICATION:  <200     mg/dL   Desirable  799-760  mg/dL   Borderline High  >=759    mg/dL   High          HDL 47 10/13/2009   LDLCALC (H) 10/13/2009    108        Total Cholesterol/HDL:CHD Risk Coronary Heart Disease Risk Table                     Men   Women  1/2 Average Risk   3.4   3.3  Average Risk       5.0   4.4  2 X Average Risk   9.6   7.1  3 X Average Risk  23.4   11.0        Use the calculated Patient Ratio above and the CHD Risk Table to determine the patient's CHD Risk.        ATP III CLASSIFICATION (LDL):  <100     mg/dL   Optimal  899-870  mg/dL   Near or Above                    Optimal  130-159  mg/dL   Borderline  839-810  mg/dL   High  >809     mg/dL   Very High   TRIG 69 94/86/7988   CHOLHDL 3.6 10/13/2009   Lab Results  Component Value Date   HGBA1C 5.7 (H) 04/17/2020   Lab Results  Component Value Date   VITAMINB12 765 11/22/2023   Lab Results  Component Value Date   TSH 0.595 11/22/2023    Lauraine Gayland MANDES, DNP  [facility] [address] [place], [place] [address] [phone]  "

## 2024-06-02 ENCOUNTER — Ambulatory Visit (INDEPENDENT_AMBULATORY_CARE_PROVIDER_SITE_OTHER): Admitting: Neurology

## 2024-06-02 ENCOUNTER — Encounter: Payer: Self-pay | Admitting: Neurology

## 2024-06-02 VITALS — BP 158/90 | HR 94 | Resp 14 | Ht 61.0 in | Wt 147.0 lb

## 2024-06-02 DIAGNOSIS — G40909 Epilepsy, unspecified, not intractable, without status epilepticus: Secondary | ICD-10-CM | POA: Diagnosis not present

## 2024-06-02 DIAGNOSIS — R569 Unspecified convulsions: Secondary | ICD-10-CM

## 2024-06-02 DIAGNOSIS — R296 Repeated falls: Secondary | ICD-10-CM

## 2024-06-02 MED ORDER — LEVETIRACETAM 750 MG PO TABS: 750.0000 mg | ORAL_TABLET | Freq: Two times a day (BID) | ORAL | 5 refills | Status: DC

## 2024-06-02 MED ORDER — VALTOCO 20 MG DOSE 2 X 10 MG/0.1ML NA LQPK: 20.0000 mg | NASAL | 1 refills | Status: AC | PRN

## 2024-06-02 NOTE — Patient Instructions (Addendum)
 Reduce Vimpat  to 200 mg once daily, start Keppra  750 mg twice daily, after 1 week stop the Vimpat . Valtoco  rescue inhaler for acute seizure. Check EEG. Get a walker.   Meds ordered this encounter  Medications   levETIRAcetam  (KEPPRA ) 750 MG tablet    Sig: Take 1 tablet (750 mg total) by mouth 2 (two) times daily.    Dispense:  60 tablet    Refill:  5   diazePAM , 20 MG Dose, (VALTOCO  20 MG DOSE) 2 x 10 MG/0.1ML LQPK    Sig: Place 20 mg into the nose as needed. For seizure, use two 10 mg devices, 1 into each nostril    Dispense:  5 each    Refill:  1

## 2024-06-06 NOTE — Patient Instructions (Signed)
 SURGICAL WAITING ROOM VISITATION Patients having surgery or a procedure may have no more than 2 support people in the waiting area - these visitors may rotate in the visitor waiting room.   If the patient needs to stay at the hospital during part of their recovery, the visitor guidelines for inpatient rooms apply.  PRE-OP VISITATION  Pre-op nurse will coordinate an appropriate time for 1 support person to accompany the patient in pre-op.  This support person may not rotate.  This visitor will be contacted when the time is appropriate for the visitor to come back in the pre-op area.  Temporary Visitor Restrictions   Children ages 2 and under will not be able to visit patients in [facility] under most circumstances. Visitation is not restricted outside of hospitals unless noted otherwise in the [facility] and Location Specific Visitation Guidelines at :       [email or web address]  Visitors with respiratory illnesses are discouraged from visiting and should remain at home.  You are not required to quarantine at this time prior to your surgery. However, you must do this: Hand Hygiene often Do NOT share personal items Notify your provider if you are in close contact with someone who has COVID or you develop fever 100.4 or greater, new onset of sneezing, cough, sore throat, shortness of breath or body aches.  If you test positive for Covid or have been in contact with anyone that has tested positive in the last 10 days please notify you surgeon.    Your procedure is scheduled on:  TUESDAY  06-08-2024  Report to [facility] Main Entrance: [address] entrance where the [employer] is available.   Report to admitting at: 10:45    AM  Call this number if you have any questions or problems the morning of surgery [phone]  FOLLOW ANY ADDITIONAL PRE OP INSTRUCTIONS YOU RECEIVED FROM YOUR SURGEON'S OFFICE!!!  Do not eat food after Midnight the night prior to your  surgery/procedure.  After Midnight you may have the following liquids until   10:00 AM DAY OF SURGERY  Clear Liquid Diet Water  Black Coffee (sugar ok, NO MILK/CREAM OR CREAMERS)  Tea (sugar ok, NO MILK/CREAM OR CREAMERS) regular and decaf                             Plain Jell-O  with no fruit (NO RED)                                           Fruit ices (not with fruit pulp, NO RED)                                     Popsicles (NO RED)                                                                  Juice: NO CITRUS JUICES: only apple, WHITE grape, WHITE cranberry Sports drinks like Gatorade or Powerade (NO RED)  Oral Hygiene is also important to reduce your risk of infection.        Remember - BRUSH YOUR TEETH THE MORNING OF SURGERY WITH YOUR REGULAR TOOTHPASTE  Do NOT smoke after Midnight the night before surgery.  STOP TAKING all Vitamins, Herbs and supplements 1 week before your surgery.   Take ONLY these medicines the morning of surgery with A SIP OF WATER : clonazepam , Depakote , Lacosamide , Hydrocodone  APAP if needed.                    You may not have any metal on your body including hair pins, jewelry, and body piercing  Do not wear make-up, lotions, powders, perfumes or deodorant  Do not wear nail polish including gel and S&S, artificial / acrylic nails, or any other type of covering on natural nails including finger and toenails. If you have artificial nails, gel coating, etc., that needs to be removed by a nail salon, Please have this removed prior to surgery. Not doing so may mean that your surgery could be cancelled or delayed if the Surgeon or anesthesia staff feels like they are unable to monitor you safely.   Do not shave 48 hours prior to surgery to avoid nicks in your skin which may contribute to postoperative infections.   Contacts, Hearing Aids, dentures or bridgework may not be worn into surgery. DENTURES WILL BE REMOVED PRIOR TO SURGERY PLEASE DO  NOT APPLY Poly grip OR ADHESIVES!!!  Patients discharged on the day of surgery will not be allowed to drive home.  Someone NEEDS to stay with you for the first 24 hours after anesthesia.  Do not bring your home medications to the hospital. The Pharmacy will dispense medications listed on your medication list to you during your admission in the Hospital.  Special Instructions: Bring a copy of your healthcare power of attorney and living will documents the day of surgery, if you wish to have them scanned into your [facility] Records- EPIC  Please read over the following fact sheets you were given: IF YOU HAVE QUESTIONS ABOUT YOUR PRE-OP INSTRUCTIONS, PLEASE CALL [phone]    [facility] - Preparing for Surgery Before surgery, you can play an important role.  Because skin is not sterile, your skin needs to be as free of germs as possible.  You can reduce the number of germs on your skin by washing with Antibacterial soap before surgery.  . Do not shave (including legs and underarms) for at least 48 hours prior to the first shower.  You may shave your face/neck.  Please follow these instructions carefully:  1.  Shower with antibacterial Soap the night before surgery and the  morning of surgery.  2.  If you choose to wash your hair, wash your hair first as usual with your normal  shampoo.  3.  After you shampoo, rinse your hair and body thoroughly to remove the shampoo.                             4.  You can apply soap directly to the skin and wash.  Gently with a scrungie or clean washcloth.  5.  Wash face,  Genitals (private parts) with your normal soap.             6.  Wash thoroughly, paying special attention to the area where your  surgery  will be performed.  7.  Thoroughly rinse your body with  warm water  from the neck down.  8.   Pat yourself dry with a clean towel.             9  Wear clean pajamas.            10 Place clean sheets on your bed the night of your first  shower and do not  sleep with pets.  ON THE DAY OF SURGERY : Do not apply any lotions/deodorants the morning of surgery.  Please wear clean clothes to the hospital/surgery center.  FAILURE TO FOLLOW THESE INSTRUCTIONS MAY RESULT IN THE CANCELLATION OF YOUR SURGERY  PATIENT SIGNATURE_________________________________  NURSE SIGNATURE__________________________________             Incentive Spirometer    An incentive spirometer is a tool that can help keep your lungs clear and active. This tool measures how well you are filling your lungs with each breath. Taking long deep breaths may help reverse or decrease the chance of developing breathing (pulmonary) problems (especially infection) following: A long period of time when you are unable to move or be active. BEFORE THE PROCEDURE  If the spirometer includes an indicator to show your best effort, your nurse or respiratory therapist will set it to a desired goal. If possible, sit up straight or lean slightly forward. Try not to slouch. Hold the incentive spirometer in an upright position. INSTRUCTIONS FOR USE  Sit on the edge of your bed if possible, or sit up as far as you can in bed or on a chair. Hold the incentive spirometer in an upright position. Breathe out normally. Place the mouthpiece in your mouth and seal your lips tightly around it. Breathe in slowly and as deeply as possible, raising the piston or the ball toward the top of the column. Hold your breath for 3-5 seconds or for as long as possible. Allow the piston or ball to fall to the bottom of the column. Remove the mouthpiece from your mouth and breathe out normally. Rest for a few seconds and repeat Steps 1 through 7 at least 10 times every 1-2 hours when you are awake. Take your time and take a few normal breaths between deep breaths. The spirometer may include an indicator to show your best effort. Use the indicator as a goal to work toward during each  repetition. After each set of 10 deep breaths, practice coughing to be sure your lungs are clear. If you have an incision (the cut made at the time of surgery), support your incision when coughing by placing a pillow or rolled up towels firmly against it. Once you are able to get out of bed, walk around indoors and cough well. You may stop using the incentive spirometer when instructed by your caregiver.  RISKS AND COMPLICATIONS Take your time so you do not get dizzy or light-headed. If you are in pain, you may need to take or ask for pain medication before doing incentive spirometry. It is harder to take a deep breath if you are having pain. AFTER USE Rest and breathe slowly and easily. It can be helpful to keep track of a log of your progress. Your caregiver can provide you with a simple table to help with this. If you are using the spirometer at home, follow these instructions: SEEK MEDICAL CARE IF:  You are having difficultly using the spirometer. You have trouble using the spirometer as often as instructed. Your pain medication is not giving enough relief while using the spirometer. You  develop fever of 100.5 F (38.1 C) or higher.                                                                                                    SEEK IMMEDIATE MEDICAL CARE IF:  You cough up bloody sputum that had not been present before. You develop fever of 102 F (38.9 C) or greater. You develop worsening pain at or near the incision site. MAKE SURE YOU:  Understand these instructions. Will watch your condition. Will get help right away if you are not doing well or get worse. Document Released: 09/30/2006 Document Revised: 08/12/2011 Document Reviewed: 12/01/2006 [facility] Patient Information 2014 [place], [place].

## 2024-06-06 NOTE — Progress Notes (Signed)
 Patient has intellectual disability and she now lives with her sister, Maria [name].    The patient was identified using 2 approved identifiers. All issues noted in this document were discussed and addressed, Ms. Maria [name] - patient's sister and caregiver voiced understanding and agreement with all preoperative instructions. The patient was emailed the surgery instructions per request.    [email or web address]  The patient was instructed to call our Pharmacy [phone] and the Admitting Office [phone] or [phone]) to complete their Pre-surgical Interview.    Patient was seen at the [facility] ED on 05-30-24 and had Labs drawn including CBC & CMP which will be used for surgery on 06-08-24  COVID Vaccine received:  []  No [x]  Yes Date of any COVID positive Test in last 90 days:  PCP - Maria Christyne Hurst, MD Cardiologist -  Neurology- Pastor Falling, MD   Chest x-ray - 05-30-24  1v  Epic EKG -  05-31-2024  Epic Stress Test -  ECHO - 04-17-2020  Epic Cardiac Cath -  CT Coronary Calcium  score:   Pacemaker / ICD device []  No []  Yes   Spinal Cord Stimulator:[]  No []  Yes    History of Sleep Apnea? [x]  No []  Yes   CPAP used?- [x]  No []  Yes    Medication on DOS: clonazepam , Depakote , Lacosamide , Hydrocodone  APAP prn  Hold DOS:  Patient has: [x]  NO Hx DM   []  Pre-DM   []  DM1  []   DM2 Does the patient monitor blood sugar?   [x]  N/A   []  No []  Yes   Blood Thinner / Instructions: Aspirin  Instructions:  Activity level: Able to walk up 2 flights of stairs without becoming significantly short of breath or having chest pain?   []    Yes   []  No,  would have:  Patient can perform ADLs without assistance.  []   Yes    []  No  Comments:   Anesthesia review: seizures/ Epilepsy (last 05-30-24), balance / gait issues, HFpEF- no cardiology, GERD, Thrombocytopenia  Patient denies any S&S of respiratory illness or Covid - no shortness of breath, fever, cough or chest pain at PAT  appointment.  Patient verbalized understanding and agreement to the Pre-Surgical Instructions that were given to them at this PAT appointment. Patient was also educated of the need to review these PAT instructions again prior to his/her surgery.I reviewed the appropriate phone numbers to call if they have any and questions or concerns.

## 2024-06-07 ENCOUNTER — Encounter (HOSPITAL_COMMUNITY)
Admission: RE | Admit: 2024-06-07 | Discharge: 2024-06-07 | Disposition: A | Discharge status: Home / Self Care | Source: Ambulatory Visit | Attending: Internal Medicine | Admitting: Internal Medicine

## 2024-06-07 ENCOUNTER — Encounter (HOSPITAL_COMMUNITY): Payer: Self-pay

## 2024-06-07 NOTE — Anesthesia Preprocedure Evaluation (Addendum)
"                                    Anesthesia Evaluation  Patient identified by MRN, date of birth, ID band Patient awake    Reviewed: Allergy & Precautions, NPO status , Patient's Chart, lab work & pertinent test results  Airway Mallampati: III  TM Distance: >3 FB Neck ROM: Full    Dental no notable dental hx.    Pulmonary former smoker   Pulmonary exam normal        Cardiovascular negative cardio ROS Normal cardiovascular exam     Neuro/Psych  Headaches, Seizures -,  PSYCHIATRIC DISORDERS Anxiety     Cognitive communication disorder Intellectual disability    GI/Hepatic negative GI ROS, Neg liver ROS,,,  Endo/Other  negative endocrine ROS    Renal/GU Renal disease     Musculoskeletal   Abdominal   Peds  Hematology negative hematology ROS (+)   Anesthesia Other Findings Closed displaced fracture of shaft of left clavicle  Reproductive/Obstetrics                              Anesthesia Physical Anesthesia Plan  ASA: 3  Anesthesia Plan: General and Regional   Post-op Pain Management: Regional block*   Induction: Intravenous  PONV Risk Score and Plan: 3 and Ondansetron , Dexamethasone  and Treatment may vary due to age or medical condition  Airway Management Planned: Oral ETT and Video Laryngoscope Planned  Additional Equipment:   Intra-op Plan:   Post-operative Plan: Extubation in OR  Informed Consent: I have reviewed the patients History and Physical, chart, labs and discussed the procedure including the risks, benefits and alternatives for the proposed anesthesia with the patient or authorized representative who has indicated his/her understanding and acceptance.     Dental advisory given  Plan Discussed with: CRNA  Anesthesia Plan Comments: (PAT note from 1/5)         Anesthesia Quick Evaluation  "

## 2024-06-07 NOTE — Progress Notes (Signed)
 " Case: [id number] Date/Time: 06/08/24 1251   Procedure: OPEN REDUCTION INTERNAL FIXATION (ORIF) CLAVICULAR FRACTURE (Left) - orif clavicle left, CC ligament reconstruction   Anesthesia type: General   Diagnosis: Closed displaced fracture of shaft of left clavicle, initial encounter [S42.022A]   Pre-op diagnosis: Closed displaced fracture of shaft of left clavicl   [facility] ROOM 07 / [facility]   Surgeons: Beverley Evalene BIRCH, MD       DISCUSSION: Maria [name] is a 64 yo female with PMH of former smoking, intellectual disability, seizures, multiple falls, HFpEF, headaches, dysphagia, GERD, anxiety  Patient had a fall and was seen in the ED on 12/23. Diagnosed with a clavicle fx and now scheduled for surgery above. She had a recurrent fall on 12/28. No new acute injuries identified.  Patient follows with Neurology for seizures. Last seen on 06/02/24 for acute visit due to increase in seizure activity. Keppra  was started, Vimpat  was weaned off. Depakote  and Klonipin continued. Also advised to check EEG (scheduled for 1/22) and f/u in 6 weeks. At pre op patient's family member reports last seizure was 12/28    VS: Ht 5' 1 (1.549 m)   Wt 67.1 kg   BMI 27.96 kg/m   PROVIDERS: [name], MD Neurology- Pastor Falling, MD   LABS: Labs reviewed: Acceptable for surgery. (all labs ordered are listed, but only abnormal results are displayed)  Labs Reviewed - No data to display   CXR 05/30/24:  IMPRESSION: 1. No acute cardiopulmonary abnormality. 2. Right shoulder arthroplasty. 3. Linear atelectasis or scarring at the left lung base.  EKG 05/31/24:  NSR RBBB   Echo 04/17/2020:  IMPRESSIONS    1. Left ventricular ejection fraction, by estimation, is 60 to 65%. The left ventricle has normal function. The left ventricle has no regional wall motion abnormalities. Left ventricular diastolic parameters are consistent with Grade II diastolic dysfunction (pseudonormalization).  2.  Right ventricular systolic function is normal. The right ventricular size is normal. There is normal pulmonary artery systolic pressure.  3. The pericardial effusion is circumferential.  4. The mitral valve is normal in structure. Mild mitral valve regurgitation. No evidence of mitral stenosis.  5. The aortic valve is normal in structure. Aortic valve regurgitation is not visualized. No aortic stenosis is present.  6. The inferior vena cava is normal in size with greater than 50% respiratory variability, suggesting right atrial pressure of 3 mmHg.  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   CAP (community acquired pneumonia) 11/21/2023   Chest pain 03/16/2014   CLOSED FRACTURE OF ACROMIAL END OF CLAVICLE 07/13/2008   Qualifier: Diagnosis of   By: Margrette MD, Stanley         Cognitive communication disorder 11/13/2015   Constipation    Coordination problem 11/13/2015   Debility 05/05/2020   Disorder of brain 11/13/2015   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   DNR (do not resuscitate) discussion 10/06/2012   Dysphagia    Dysuria 03/13/2021   ESBL (extended spectrum beta-lactamase) producing bacteria infection 05/12/2020   Fall 11/09/2015   Fracture    R foot   GERD (gastroesophageal reflux disease)    Headache    Heart failure (HCC) 10/24/2020   Herpes labialis 11/28/2023  History of colonic polyps 08/07/2022   History of shingles 10/2016   Intellectual disability    Mild     Mental retardation    lesion in head   Myoclonus    Overactive bladder 10/24/2020   Pain in right shoulder 08/24/2021   Palliative care by specialist    Pneumonia    Positive colorectal cancer screening using Cologuard test 10/28/2018    Added automatically from request for surgery [id number]     Renal abscess, left 04/14/2020   Seizures (HCC)    Last seizure 05/31/23   Thrombocytopenia    Tremor     Past Surgical History:  Procedure Laterality Date   BIOPSY  09/16/2014   Procedure: BIOPSY;  Surgeon: Claudis RAYMOND Rivet, MD;  [facility]: [facility];  Service: Endoscopy;;   CATARACT EXTRACTION     both eyes, May of 2015   COLONOSCOPY     COLONOSCOPY WITH PROPOFOL  N/A 11/20/2018   Procedure: COLONOSCOPY WITH PROPOFOL ;  Surgeon: Rivet Claudis RAYMOND, MD;  [facility];  Service: Endoscopy;  Laterality: N/A;   COLONOSCOPY WITH PROPOFOL  N/A 08/07/2022   Procedure: COLONOSCOPY WITH PROPOFOL ;  Surgeon: Eartha Angelia Sieving, MD;  [facility];  Service: Gastroenterology;  Laterality: N/A;  7:30am, asa 1-2   ESOPHAGEAL DILATION N/A 09/16/2014   Procedure: ESOPHAGEAL DILATION WITH 54FR MALONEY DILATOR;  Surgeon: Claudis RAYMOND Rivet, MD;  [facility];  Service: Endoscopy;  Laterality: N/A;   ESOPHAGOGASTRODUODENOSCOPY (EGD) WITH PROPOFOL  N/A 09/16/2014   Procedure: ESOPHAGOGASTRODUODENOSCOPY (EGD) WITH PROPOFOL ;  Surgeon: Claudis RAYMOND Rivet, MD;  [facility];  Service: Endoscopy;  Laterality: N/A;   HEMOSTASIS CLIP PLACEMENT  08/07/2022   Procedure: HEMOSTASIS CLIP PLACEMENT;  Surgeon: Eartha Angelia Sieving, MD;  [facility];  Service: Gastroenterology;;   LAPAROSCOPIC APPENDECTOMY N/A 07/14/2020   Procedure: APPENDECTOMY LAPAROSCOPIC;  Surgeon: Kallie Manuelita BROCKS, MD;  Location[facility];  Service: General;  Laterality: N/A;   MOUTH SURGERY     POLYPECTOMY  11/20/2018   Procedure: POLYPECTOMY;  Surgeon: Rivet Claudis RAYMOND, MD;  [facility];  Service: Endoscopy;;  colon   POLYPECTOMY  08/07/2022   Procedure: POLYPECTOMY;  Surgeon: Eartha Angelia Sieving, MD;  [facility];  Service: Gastroenterology;;   REVERSE SHOULDER ARTHROPLASTY Right 06/12/2023   Procedure: REVERSE SHOULDER ARTHROPLASTY;   Surgeon: Cristy Bonner DASEN, MD;  [facility];  Service: Orthopedics;  Laterality: Right;   Skin graft to gum Right 08/2013   SUBMUCOSAL LIFTING INJECTION  08/07/2022   Procedure: SUBMUCOSAL LIFTING INJECTION;  Surgeon: Eartha Angelia, Sieving, MD;  [facility];  Service: Gastroenterology;;   TONSILLECTOMY AND ADENOIDECTOMY     TOTAL ABDOMINAL HYSTERECTOMY      MEDICATIONS:  atorvastatin  (LIPITOR) 20 MG tablet   Calcium  Carb-Cholecalciferol (CALCIUM /VITAMIN D  PO)   calcium  carbonate (TUMS - DOSED IN MG ELEMENTAL CALCIUM ) 500 MG chewable tablet   cholecalciferol (VITAMIN D3) 25 MCG (1000 UNIT) tablet   clonazePAM  (KLONOPIN ) 0.5 MG tablet   DEPAKOTE  500 MG DR tablet   diazePAM , 20 MG Dose, (VALTOCO  20 MG DOSE) 2 x 10 MG/0.1ML LQPK   HYDROcodone -acetaminophen  (NORCO/VICODIN) 5-325 MG tablet   lacosamide  (VIMPAT ) 200 MG TABS tablet   levETIRAcetam  (KEPPRA ) 750 MG tablet   Multiple Vitamin (MULTIVITAMIN) capsule   omeprazole  (PRILOSEC) 40 MG capsule   senna (SENOKOT) 8.6 MG tablet   Vibegron  (GEMTESA ) 75 MG TABS   No current facility-administered medications for this encounter.   Burnard HERO  Odis RIGGERS [facility] Short Stay/Anesthesiology [facility] Phone [phone] 06/07/2024 2:33 PM         "

## 2024-06-08 ENCOUNTER — Ambulatory Visit (HOSPITAL_COMMUNITY): Admitting: Certified Registered"

## 2024-06-08 ENCOUNTER — Encounter (HOSPITAL_COMMUNITY): Admission: RE | Disposition: A | Payer: Self-pay | Source: Home / Self Care | Attending: Orthopedic Surgery

## 2024-06-08 ENCOUNTER — Ambulatory Visit (HOSPITAL_COMMUNITY)
Admission: RE | Admit: 2024-06-08 | Discharge: 2024-06-08 | Disposition: A | Discharge status: Home / Self Care | Attending: Orthopedic Surgery | Admitting: Orthopedic Surgery

## 2024-06-08 ENCOUNTER — Other Ambulatory Visit: Payer: Self-pay

## 2024-06-08 ENCOUNTER — Encounter (HOSPITAL_COMMUNITY): Payer: Self-pay | Admitting: Orthopedic Surgery

## 2024-06-08 ENCOUNTER — Ambulatory Visit (HOSPITAL_COMMUNITY): Admitting: Medical

## 2024-06-08 DIAGNOSIS — S4382XA Sprain of other specified parts of left shoulder girdle, initial encounter: Secondary | ICD-10-CM | POA: Diagnosis not present

## 2024-06-08 DIAGNOSIS — Z87891 Personal history of nicotine dependence: Secondary | ICD-10-CM | POA: Insufficient documentation

## 2024-06-08 DIAGNOSIS — R569 Unspecified convulsions: Secondary | ICD-10-CM | POA: Diagnosis not present

## 2024-06-08 DIAGNOSIS — F419 Anxiety disorder, unspecified: Secondary | ICD-10-CM

## 2024-06-08 DIAGNOSIS — S42022A Displaced fracture of shaft of left clavicle, initial encounter for closed fracture: Secondary | ICD-10-CM

## 2024-06-08 DIAGNOSIS — X58XXXA Exposure to other specified factors, initial encounter: Secondary | ICD-10-CM | POA: Diagnosis not present

## 2024-06-08 DIAGNOSIS — F79 Unspecified intellectual disabilities: Secondary | ICD-10-CM | POA: Diagnosis not present

## 2024-06-08 HISTORY — PX: ORIF CLAVICULAR FRACTURE: SHX5055

## 2024-06-08 SURGERY — OPEN REDUCTION INTERNAL FIXATION (ORIF) CLAVICULAR FRACTURE
Anesthesia: Regional | Site: Shoulder | Laterality: Left

## 2024-06-08 MED ORDER — ROCURONIUM BROMIDE 10 MG/ML (PF) SYRINGE
PREFILLED_SYRINGE | INTRAVENOUS | Status: DC | PRN
  Administered 2024-06-08: 40 mg via INTRAVENOUS

## 2024-06-08 MED ORDER — ROCURONIUM BROMIDE 10 MG/ML (PF) SYRINGE
PREFILLED_SYRINGE | INTRAVENOUS | Status: AC
  Filled 2024-06-08: qty 10

## 2024-06-08 MED ORDER — FENTANYL CITRATE (PF) 50 MCG/ML IJ SOSY
PREFILLED_SYRINGE | INTRAMUSCULAR | Status: AC
  Filled 2024-06-08: qty 1

## 2024-06-08 MED ORDER — MELOXICAM 15 MG PO TABS: 15.0000 mg | ORAL_TABLET | Freq: Every day | ORAL | 0 refills | Status: AC | PRN

## 2024-06-08 MED ORDER — PROPOFOL 10 MG/ML IV BOLUS
INTRAVENOUS | Status: AC
  Filled 2024-06-08: qty 20

## 2024-06-08 MED ORDER — AMISULPRIDE (ANTIEMETIC) 5 MG/2ML IV SOLN: 10.0000 mg | Freq: Once | INTRAVENOUS | Status: DC | PRN

## 2024-06-08 MED ORDER — 0.9 % SODIUM CHLORIDE (POUR BTL) OPTIME
TOPICAL | Status: DC | PRN
  Administered 2024-06-08: 1000 mL

## 2024-06-08 MED ORDER — PHENYLEPHRINE HCL-NACL 20-0.9 MG/250ML-% IV SOLN
INTRAVENOUS | Status: DC | PRN
  Administered 2024-06-08: 25 ug/min via INTRAVENOUS

## 2024-06-08 MED ORDER — ACETAMINOPHEN 500 MG PO TABS
1000.0000 mg | ORAL_TABLET | Freq: Once | ORAL | Status: AC
  Administered 2024-06-08: 1000 mg via ORAL
  Filled 2024-06-08: qty 2

## 2024-06-08 MED ORDER — CHLORHEXIDINE GLUCONATE 0.12 % MT SOLN
15.0000 mL | Freq: Once | OROMUCOSAL | Status: AC
  Administered 2024-06-08: 15 mL via OROMUCOSAL

## 2024-06-08 MED ORDER — PHENYLEPHRINE 80 MCG/ML (10ML) SYRINGE FOR IV PUSH (FOR BLOOD PRESSURE SUPPORT)
PREFILLED_SYRINGE | INTRAVENOUS | Status: DC | PRN
  Administered 2024-06-08 (×2): 160 ug via INTRAVENOUS

## 2024-06-08 MED ORDER — OXYCODONE HCL 5 MG/5ML PO SOLN: 5.0000 mg | Freq: Once | ORAL | Status: DC | PRN

## 2024-06-08 MED ORDER — ORAL CARE MOUTH RINSE: 15.0000 mL | Freq: Once | OROMUCOSAL | Status: AC

## 2024-06-08 MED ORDER — OXYCODONE HCL 5 MG PO TABS: 5.0000 mg | ORAL_TABLET | Freq: Once | ORAL | Status: DC | PRN

## 2024-06-08 MED ORDER — VANCOMYCIN HCL 1000 MG IV SOLR
INTRAVENOUS | Status: AC
  Filled 2024-06-08: qty 20

## 2024-06-08 MED ORDER — CEFAZOLIN SODIUM-DEXTROSE 2-4 GM/100ML-% IV SOLN
2.0000 g | INTRAVENOUS | Status: AC
  Administered 2024-06-08: 2 g via INTRAVENOUS
  Filled 2024-06-08: qty 100

## 2024-06-08 MED ORDER — PROPOFOL 10 MG/ML IV BOLUS
INTRAVENOUS | Status: DC | PRN
  Administered 2024-06-08: 100 mg via INTRAVENOUS

## 2024-06-08 MED ORDER — ONDANSETRON 4 MG PO TBDP: 4.0000 mg | ORAL_TABLET | Freq: Three times a day (TID) | ORAL | 0 refills | Status: AC | PRN

## 2024-06-08 MED ORDER — HYDROCODONE-ACETAMINOPHEN 10-325 MG PO TABS: 1.0000 | ORAL_TABLET | Freq: Four times a day (QID) | ORAL | 0 refills | Status: AC | PRN

## 2024-06-08 MED ORDER — SUGAMMADEX SODIUM 200 MG/2ML IV SOLN
INTRAVENOUS | Status: DC | PRN
  Administered 2024-06-08: 140 mg via INTRAVENOUS

## 2024-06-08 MED ORDER — ROPIVACAINE HCL 5 MG/ML IJ SOLN
INTRAMUSCULAR | Status: DC | PRN
  Administered 2024-06-08: 30 mL via PERINEURAL

## 2024-06-08 MED ORDER — LIDOCAINE HCL (PF) 2 % IJ SOLN
INTRAMUSCULAR | Status: AC
  Filled 2024-06-08: qty 5

## 2024-06-08 MED ORDER — FENTANYL CITRATE (PF) 100 MCG/2ML IJ SOLN
INTRAMUSCULAR | Status: AC
  Filled 2024-06-08: qty 2

## 2024-06-08 MED ORDER — FENTANYL CITRATE (PF) 50 MCG/ML IJ SOSY
25.0000 ug | PREFILLED_SYRINGE | INTRAMUSCULAR | Status: DC | PRN
  Administered 2024-06-08 (×2): 50 ug via INTRAVENOUS

## 2024-06-08 MED ORDER — ASPIRIN 81 MG PO TBEC: 81.0000 mg | DELAYED_RELEASE_TABLET | Freq: Two times a day (BID) | ORAL | 0 refills | Status: AC

## 2024-06-08 MED ORDER — ONDANSETRON HCL 4 MG/2ML IJ SOLN
INTRAMUSCULAR | Status: AC
  Filled 2024-06-08: qty 2

## 2024-06-08 MED ORDER — FENTANYL CITRATE (PF) 250 MCG/5ML IJ SOLN
INTRAMUSCULAR | Status: DC | PRN
  Administered 2024-06-08: 50 ug via INTRAVENOUS

## 2024-06-08 MED ORDER — LACTATED RINGERS IV SOLN: INTRAVENOUS | Status: DC

## 2024-06-08 MED ORDER — POVIDONE-IODINE 10 % EX SWAB
2.0000 | Freq: Once | CUTANEOUS | Status: AC
  Administered 2024-06-08: 2 via TOPICAL

## 2024-06-08 MED ORDER — ONDANSETRON HCL 4 MG/2ML IJ SOLN
INTRAMUSCULAR | Status: DC | PRN
  Administered 2024-06-08: 4 mg via INTRAVENOUS

## 2024-06-08 MED ORDER — FENTANYL CITRATE (PF) 50 MCG/ML IJ SOSY
50.0000 ug | PREFILLED_SYRINGE | Freq: Once | INTRAMUSCULAR | Status: AC
  Administered 2024-06-08: 50 ug via INTRAVENOUS
  Filled 2024-06-08: qty 2

## 2024-06-08 MED ORDER — VANCOMYCIN HCL 1000 MG IV SOLR
INTRAVENOUS | Status: DC | PRN
  Administered 2024-06-08: 1000 mg via TOPICAL

## 2024-06-08 SURGICAL SUPPLY — 32 items
BIT DRILL CLAV LONG 2.2X135 (BIT) IMPLANT
CHLORAPREP W/TINT 26 (MISCELLANEOUS) IMPLANT
CLSR STERI-STRIP ANTIMIC 1/2X4 (GAUZE/BANDAGES/DRESSINGS) IMPLANT
DRAPE IMP U-DRAPE 54X76 (DRAPES) ×1 IMPLANT
DRAPE OEC MINIVIEW 54X84 (DRAPES) IMPLANT
DRAPE SURG ORHT 6 SPLT 77X108 (DRAPES) ×2 IMPLANT
DRAPE U-SHAPE 47X51 STRL (DRAPES) ×2 IMPLANT
DRSG MEPILEX POST OP 4X8 (GAUZE/BANDAGES/DRESSINGS) IMPLANT
ELECT REM PT RETURN 15FT ADLT (MISCELLANEOUS) ×1 IMPLANT
GAUZE SPONGE 4X4 12PLY STRL (GAUZE/BANDAGES/DRESSINGS) ×1 IMPLANT
GLOVE BIO SURGEON STRL SZ7.5 (GLOVE) ×2 IMPLANT
GLOVE BIOGEL PI IND STRL 7.0 (GLOVE) ×1 IMPLANT
GLOVE BIOGEL PI IND STRL 8 (GLOVE) ×1 IMPLANT
GLOVE SURG SYN 6.5 PF PI (GLOVE) ×1 IMPLANT
GOWN STRL REUS W/ TWL LRG LVL3 (GOWN DISPOSABLE) ×3 IMPLANT
KIT AC JOINT DISP (KITS) IMPLANT
KIT BASIN OR (CUSTOM PROCEDURE TRAY) ×1 IMPLANT
KIT TURNOVER KIT A (KITS) ×1 IMPLANT
MANIFOLD NEPTUNE II (INSTRUMENTS) ×1 IMPLANT
NS IRRIG 1000ML POUR BTL (IV SOLUTION) ×1 IMPLANT
PACK SHOULDER (CUSTOM PROCEDURE TRAY) ×1 IMPLANT
PLATE DIST CLAV NRW 55 9H LT (Plate) IMPLANT
SCREW 2.7X12MM (Screw) IMPLANT
SCREW MULTI DIRECTIONAL 2.7X14 (Screw) IMPLANT
SLING ARM FOAM STRAP MED (SOFTGOODS) IMPLANT
SOLN STERILE WATER BTL 1000 ML (IV SOLUTION) ×1 IMPLANT
SUCTION TUBE FRAZIER 10FR DISP (SUCTIONS) ×1 IMPLANT
SUT MNCRL AB 4-0 PS2 18 (SUTURE) ×1 IMPLANT
SUT MON AB 2-0 CT1 36 (SUTURE) ×1 IMPLANT
SUT VIC AB 0 CT1 36 (SUTURE) ×1 IMPLANT
TOWEL OR DSP ST BLU DLX 10/PK (DISPOSABLE) ×1 IMPLANT
ZIPLOOP AC JOINT REPAIR (Orthopedic Implant) IMPLANT

## 2024-06-08 NOTE — Interval H&P Note (Signed)
 History and Physical Interval Note:  06/08/2024 11:02 AM  [name]  has presented today for surgery, with the diagnosis of Closed displaced fracture of shaft of left clavicl.  The various methods of treatment have been discussed with the patient and family. After consideration of risks, benefits and other options for treatment, the patient has consented to  Procedures with comments: OPEN REDUCTION INTERNAL FIXATION (ORIF) CLAVICULAR FRACTURE (Left) - orif clavicle left, CC ligament reconstruction as a surgical intervention.  The patient's history has been reviewed, patient examined, no change in status, stable for surgery.  I have reviewed the patient's chart and labs.  Questions were answered to the patient's satisfaction.     [name]

## 2024-06-08 NOTE — Anesthesia Procedure Notes (Signed)
 Procedure Name: Intubation Date/Time: 06/08/2024 1:09 PM  Performed by: Gloristine Turrubiates, Corean BROCKS, CRNAPre-anesthesia Checklist: Patient identified, Emergency Drugs available, Suction available and Patient being monitored Patient Re-evaluated:Patient Re-evaluated prior to induction Oxygen  Delivery Method: Circle system utilized Preoxygenation: Pre-oxygenation with 100% oxygen  Induction Type: IV induction Ventilation: Mask ventilation without difficulty Laryngoscope Size: Mac and 3 Grade View: Grade I Tube type: Oral Tube size: 7.0 mm Number of attempts: 1 Airway Equipment and Method: Stylet and Oral airway Placement Confirmation: ETT inserted through vocal cords under direct vision, positive ETCO2 and breath sounds checked- equal and bilateral Secured at: 20 cm Tube secured with: Tape Dental Injury: Teeth and Oropharynx as per pre-operative assessment

## 2024-06-08 NOTE — Anesthesia Procedure Notes (Signed)
 Anesthesia Regional Block: Interscalene brachial plexus block   Pre-Anesthetic Checklist: , timeout performed,  Correct Patient, Correct Site, Correct Laterality,  Correct Procedure, Correct Position, site marked,  Risks and benefits discussed,  Surgical consent,  Pre-op evaluation,  At surgeon's request and post-op pain management  Laterality: Left  Prep: chloraprep       Needles:  Injection technique: Single-shot  Needle Type: Echogenic Stimulator Needle     Needle Length: 9cm  Needle Gauge: 21     Additional Needles:   Procedures:,,,, ultrasound used (permanent image in chart),,    Narrative:  Start time: 06/08/2024 12:30 PM End time: 06/08/2024 12:40 PM Injection made incrementally with aspirations every 5 mL.  Performed by: Personally  Anesthesiologist: Patrisha Bernardino SQUIBB, MD  Additional Notes: Functioning IV was confirmed and monitors were applied.  A timeout was performed. Sterile prep, hand hygiene and sterile gloves were used. A 90mm 21ga Arrow echogenic stimulator needle was used. Negative aspiration and negative test dose prior to incremental administration of local anesthetic. The patient tolerated the procedure well.  Ultrasound guidance: relevent anatomy identified, needle position confirmed, local anesthetic spread visualized around nerve(s), vascular puncture avoided.  Image printed for medical record.

## 2024-06-08 NOTE — Transfer of Care (Signed)
 Immediate Anesthesia Transfer of Care Note  Patient: Maria [name]  Procedure(s) Performed: OPEN REDUCTION INTERNAL FIXATION (ORIF) CLAVICULAR FRACTURE WITH CORACLAVICULAR LIGAMENT REPAIR (Left: Shoulder)  Patient Location: PACU  Anesthesia Type:General  Level of Consciousness: drowsy  Airway & Oxygen  Therapy: Patient Spontanous Breathing and Patient connected to face mask oxygen   Post-op Assessment: Report given to RN and Post -op Vital signs reviewed and stable  Post vital signs: Reviewed and stable  Last Vitals:  Vitals Value Taken Time  BP 149/73 06/08/24 15:15  Temp 36.3 C 06/08/24 14:49  Pulse 69 06/08/24 15:20  Resp 11 06/08/24 15:20  SpO2 90 % 06/08/24 15:20  Vitals shown include unfiled device data.  Last Pain:  Vitals:   06/08/24 1500  TempSrc:   PainSc: 10-Worst pain ever      Patients Stated Pain Goal: 4 (06/08/24 1132)  Complications: No notable events documented.

## 2024-06-08 NOTE — Discharge Instructions (Addendum)

## 2024-06-09 ENCOUNTER — Encounter (HOSPITAL_COMMUNITY): Payer: Self-pay | Admitting: Orthopedic Surgery

## 2024-06-09 NOTE — Anesthesia Postprocedure Evaluation (Signed)
"   Anesthesia Post Note  Patient: Maria [name]  Procedure(s) Performed: OPEN REDUCTION INTERNAL FIXATION (ORIF) CLAVICULAR FRACTURE WITH CORACLAVICULAR LIGAMENT REPAIR (Left: Shoulder)     Patient location during evaluation: PACU Anesthesia Type: Regional and General Level of consciousness: awake Pain management: pain level controlled Vital Signs Assessment: post-procedure vital signs reviewed and stable Respiratory status: spontaneous breathing, nonlabored ventilation and respiratory function stable Cardiovascular status: blood pressure returned to baseline and stable Postop Assessment: no apparent nausea or vomiting Anesthetic complications: no   No notable events documented.  Last Vitals:  Vitals:   06/08/24 1615 06/08/24 1635  BP: 123/76 (!) 154/85  Pulse: 61 66  Resp: 17 14  Temp:    SpO2: 93% 95%    Last Pain:  Vitals:   06/08/24 1635  TempSrc:   PainSc: 0-No pain                 Eron Goble P Madalene Mickler      "

## 2024-06-09 NOTE — Op Note (Signed)
 06/08/2024  7:39 AM  PATIENT:  Maria [name]    PRE-OPERATIVE DIAGNOSIS:  Closed displaced fracture of shaft of left clavicle with disruption of coraclavicular ligament  POST-OPERATIVE DIAGNOSIS:  Same  PROCEDURE:  OPEN REDUCTION INTERNAL FIXATION (ORIF) CLAVICULAR FRACTURE WITH CORACLAVICULAR LIGAMENT REPAIR  SURGEON:  [name], MD  ASSISTANT: Gerard Large, PA-C, he was present and scrubbed throughout the case, critical for completion in a timely fashion, and for retraction, instrumentation, and closure.   ANESTHESIA:   Plus block  PREOPERATIVE INDICATIONS:  [name] is a  64 y.o. female with a diagnosis of Closed displaced fracture of shaft of left clavicle with disruption of coraclavicular ligament who failed conservative measures and elected for surgical management.    The risks benefits and alternatives were discussed with the patient preoperatively including but not limited to the risks of infection, bleeding, nerve injury, cardiopulmonary complications, the need for revision surgery, among others, and the patient was willing to proceed.  OPERATIVE IMPLANTS: Biomet distal clavicle plate and CC ligament reconstruction button  OPERATIVE FINDINGS: Displaced clavicle fracture CC ligament rupture  BLOOD LOSS: Minimal  COMPLICATIONS: None  TOURNIQUET TIME: None  OPERATIVE PROCEDURE:  Patient was identified in the preoperative holding area and site was marked by me She was transported to the operating theater and placed on the table in supine position taking care to pad all bony prominences. After a preincinduction time out anesthesia was induced. The left upper extremity was prepped and draped in normal sterile fashion and a pre-incision timeout was performed. She received Ancef  for preoperative antibiotics.   She was placed in the beachchair position again padding all bony prominences and the left upper extremity was prepped and draped and made an incision over her  distal clavicle and dissected down to the fracture protecting all neurovascular structures I reduced her clavicle fracture using manipulation and used fluoroscopic guidance to place a plate on the superior aspect of the clavicle  I then used fluoroscopic guidance to drill a bicortical hole through the clavicle as well as the coracoid I next passed a button on the inferior aspect of the coracoid and delivered this through the plate  I secured the plate and placed on the clavicle and reduced the CC ligament with this mechanism this performed a reconstruction of the CC ligament  Next I placed distal locking screws in the clavicle with 2 more shaft screws  I took 4+ x-rays of the clavicle and reviewed them myself showing appropriate reduction and hardware placement  POST OPERATIVE PLAN: Sling full-time

## 2024-06-12 ENCOUNTER — Other Ambulatory Visit: Payer: Self-pay | Admitting: Urology

## 2024-06-12 DIAGNOSIS — R350 Frequency of micturition: Secondary | ICD-10-CM

## 2024-06-15 ENCOUNTER — Other Ambulatory Visit: Payer: Self-pay

## 2024-06-15 ENCOUNTER — Encounter: Payer: Self-pay | Admitting: Neurology

## 2024-06-15 MED ORDER — LEVETIRACETAM 750 MG PO TABS: 750.0000 mg | ORAL_TABLET | Freq: Two times a day (BID) | ORAL | 5 refills | Status: DC

## 2024-06-16 ENCOUNTER — Emergency Department (HOSPITAL_COMMUNITY)

## 2024-06-16 ENCOUNTER — Encounter (HOSPITAL_COMMUNITY): Payer: Self-pay

## 2024-06-16 ENCOUNTER — Inpatient Hospital Stay (HOSPITAL_COMMUNITY)
Admission: EM | Admit: 2024-06-16 | Discharge: 2024-06-19 | DRG: 100 | Disposition: A | Discharge status: Home Health | Attending: Family Medicine | Admitting: Family Medicine

## 2024-06-16 ENCOUNTER — Other Ambulatory Visit: Payer: Self-pay

## 2024-06-16 DIAGNOSIS — S42002A Fracture of unspecified part of left clavicle, initial encounter for closed fracture: Secondary | ICD-10-CM | POA: Diagnosis present

## 2024-06-16 DIAGNOSIS — Z87891 Personal history of nicotine dependence: Secondary | ICD-10-CM

## 2024-06-16 DIAGNOSIS — G9341 Metabolic encephalopathy: Secondary | ICD-10-CM | POA: Diagnosis present

## 2024-06-16 DIAGNOSIS — R296 Repeated falls: Secondary | ICD-10-CM | POA: Diagnosis present

## 2024-06-16 DIAGNOSIS — Z23 Encounter for immunization: Secondary | ICD-10-CM

## 2024-06-16 DIAGNOSIS — R569 Unspecified convulsions: Principal | ICD-10-CM

## 2024-06-16 DIAGNOSIS — Z885 Allergy status to narcotic agent status: Secondary | ICD-10-CM

## 2024-06-16 DIAGNOSIS — N179 Acute kidney failure, unspecified: Secondary | ICD-10-CM | POA: Diagnosis present

## 2024-06-16 DIAGNOSIS — Z8616 Personal history of COVID-19: Secondary | ICD-10-CM

## 2024-06-16 DIAGNOSIS — X58XXXA Exposure to other specified factors, initial encounter: Secondary | ICD-10-CM | POA: Diagnosis present

## 2024-06-16 DIAGNOSIS — Z90710 Acquired absence of both cervix and uterus: Secondary | ICD-10-CM

## 2024-06-16 DIAGNOSIS — Z888 Allergy status to other drugs, medicaments and biological substances status: Secondary | ICD-10-CM

## 2024-06-16 DIAGNOSIS — Z96611 Presence of right artificial shoulder joint: Secondary | ICD-10-CM | POA: Diagnosis present

## 2024-06-16 DIAGNOSIS — Z7982 Long term (current) use of aspirin: Secondary | ICD-10-CM

## 2024-06-16 DIAGNOSIS — F70 Mild intellectual disabilities: Secondary | ICD-10-CM | POA: Diagnosis present

## 2024-06-16 DIAGNOSIS — F419 Anxiety disorder, unspecified: Secondary | ICD-10-CM | POA: Diagnosis present

## 2024-06-16 DIAGNOSIS — G40909 Epilepsy, unspecified, not intractable, without status epilepticus: Principal | ICD-10-CM | POA: Diagnosis present

## 2024-06-16 DIAGNOSIS — E785 Hyperlipidemia, unspecified: Secondary | ICD-10-CM | POA: Diagnosis present

## 2024-06-16 DIAGNOSIS — N3000 Acute cystitis without hematuria: Secondary | ICD-10-CM

## 2024-06-16 DIAGNOSIS — Z1152 Encounter for screening for COVID-19: Secondary | ICD-10-CM

## 2024-06-16 DIAGNOSIS — E860 Dehydration: Secondary | ICD-10-CM | POA: Diagnosis present

## 2024-06-16 DIAGNOSIS — Z833 Family history of diabetes mellitus: Secondary | ICD-10-CM

## 2024-06-16 DIAGNOSIS — Z79899 Other long term (current) drug therapy: Secondary | ICD-10-CM

## 2024-06-16 LAB — COMPREHENSIVE METABOLIC PANEL WITH GFR
ALT: 15 U/L (ref 0–44)
AST: 22 U/L (ref 15–41)
Albumin: 4.2 g/dL (ref 3.5–5.0)
Alkaline Phosphatase: 113 U/L (ref 38–126)
Anion gap: 15 (ref 5–15)
BUN: 22 mg/dL (ref 8–23)
CO2: 22 mmol/L (ref 22–32)
Calcium: 9.9 mg/dL (ref 8.9–10.3)
Chloride: 105 mmol/L (ref 98–111)
Creatinine, Ser: 1.08 mg/dL — ABNORMAL HIGH (ref 0.44–1.00)
GFR, Estimated: 58 mL/min — ABNORMAL LOW
Glucose, Bld: 124 mg/dL — ABNORMAL HIGH (ref 70–99)
Potassium: 4.1 mmol/L (ref 3.5–5.1)
Sodium: 142 mmol/L (ref 135–145)
Total Bilirubin: 0.4 mg/dL (ref 0.0–1.2)
Total Protein: 6.8 g/dL (ref 6.5–8.1)

## 2024-06-16 LAB — RESP PANEL BY RT-PCR (RSV, FLU A&B, COVID)  RVPGX2
Influenza A by PCR: NEGATIVE
Influenza B by PCR: NEGATIVE
Resp Syncytial Virus by PCR: NEGATIVE
SARS Coronavirus 2 by RT PCR: NEGATIVE

## 2024-06-16 LAB — CBC WITH DIFFERENTIAL/PLATELET
Abs Immature Granulocytes: 0.04 K/uL (ref 0.00–0.07)
Basophils Absolute: 0 K/uL (ref 0.0–0.1)
Basophils Relative: 0 %
Eosinophils Absolute: 0 K/uL (ref 0.0–0.5)
Eosinophils Relative: 0 %
HCT: 37.1 % (ref 36.0–46.0)
Hemoglobin: 12 g/dL (ref 12.0–15.0)
Immature Granulocytes: 0 %
Lymphocytes Relative: 18 %
Lymphs Abs: 1.6 K/uL (ref 0.7–4.0)
MCH: 30.1 pg (ref 26.0–34.0)
MCHC: 32.3 g/dL (ref 30.0–36.0)
MCV: 93 fL (ref 80.0–100.0)
Monocytes Absolute: 0.8 K/uL (ref 0.1–1.0)
Monocytes Relative: 9 %
Neutro Abs: 6.6 K/uL (ref 1.7–7.7)
Neutrophils Relative %: 73 %
Platelets: 192 K/uL (ref 150–400)
RBC: 3.99 MIL/uL (ref 3.87–5.11)
RDW: 13.9 % (ref 11.5–15.5)
WBC: 9.1 K/uL (ref 4.0–10.5)
nRBC: 0 % (ref 0.0–0.2)

## 2024-06-16 LAB — URINE DRUG SCREEN
Amphetamines: NEGATIVE
Barbiturates: NEGATIVE
Benzodiazepines: POSITIVE — AB
Cocaine: NEGATIVE
Fentanyl: NEGATIVE
Methadone Scn, Ur: NEGATIVE
Opiates: POSITIVE — AB
Tetrahydrocannabinol: NEGATIVE

## 2024-06-16 LAB — VALPROIC ACID LEVEL: Valproic Acid Lvl: 79 ug/mL (ref 50–100)

## 2024-06-16 LAB — MAGNESIUM: Magnesium: 1.9 mg/dL (ref 1.7–2.4)

## 2024-06-16 MED ORDER — LEVETIRACETAM (KEPPRA) 500 MG/5 ML ADULT IV PUSH
2000.0000 mg | Freq: Once | INTRAVENOUS | Status: AC
  Administered 2024-06-16: 2000 mg via INTRAVENOUS
  Filled 2024-06-16: qty 20

## 2024-06-16 MED ORDER — LORAZEPAM 2 MG/ML IJ SOLN
2.0000 mg | Freq: Once | INTRAMUSCULAR | Status: AC
  Administered 2024-06-16: 2 mg via INTRAVENOUS
  Filled 2024-06-16: qty 1

## 2024-06-16 MED ORDER — SODIUM CHLORIDE 0.9 % IV BOLUS
1000.0000 mL | Freq: Once | INTRAVENOUS | Status: AC
  Administered 2024-06-16: 1000 mL via INTRAVENOUS

## 2024-06-16 NOTE — ED Notes (Signed)
 Called Tele Neuro will call back lines all busy

## 2024-06-16 NOTE — ED Notes (Addendum)
 Called into room by family at 2218. Patient having seizure, whole body convulsing. Lasted appx 45 seconds.  Then at  2220 patient had another event.  Edp at bedside.

## 2024-06-16 NOTE — ED Notes (Signed)
 ED Provider at bedside.

## 2024-06-16 NOTE — ED Triage Notes (Signed)
 [facility] from home Cc of seizures. Multiple today A&Ox4 Recently changed seizure medication.  C/o left arm pain from one of the falls from the seizures

## 2024-06-16 NOTE — ED Notes (Signed)
 Pt assisted to bathroom via wheelchair. Unable to void at this time.

## 2024-06-16 NOTE — ED Provider Notes (Signed)
 " [place] EMERGENCY DEPARTMENT AT [facility] Provider Note  CSN: [id number] Arrival date & time: 06/16/24 2015  Chief Complaint(s) Seizures  HPI Maria [name] is a 64 y.o. female history of cognitive disorder, epilepsy, presenting with seizure.  Patient lives with her sister who is a caregiver.  Has had multiple seizures yesterday, 1 today early in the morning.  No fevers, chills.  Seems a little sleepier than normal but following seizure she is able to communicate normally.  Has been taking her medications as prescribed.  Was previously on Vimpat , Depakote  and Klonopin , recently switched from Vimpat  to Keppra .  Possibly hit her head today during seizure but also had recent head injury from prior fall at the end of December.  Also recently had a fracture repair of her clavicle.  No fevers or chills, cough, runny nose or sore throat, vomiting.  Patient denies abdominal pain.  Has not been complaining of any urinary symptoms.  History provided by sister orthoptist.   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   Dysphagia    Dysuria 03/13/2021   ESBL (extended spectrum beta-lactamase) producing bacteria infection 05/12/2020   Fall 11/09/2015   Fracture    R foot   GERD (gastroesophageal reflux disease)    Headache    Heart failure (HCC) 10/24/2020   Herpes labialis  11/28/2023   History of colonic polyps 08/07/2022   History of shingles 10/2016   Intellectual disability    Mild     Mental retardation    lesion in head   Myoclonus    Overactive bladder 10/24/2020   Pain in right shoulder 08/24/2021   Palliative care by specialist    Pneumonia    Positive colorectal cancer screening using Cologuard test 10/28/2018   Added automatically from request for surgery [id number]     Renal abscess, left 04/14/2020   Seizures (HCC)    Last seizure 05/31/23   Thrombocytopenia    Tremor    Patient Active Problem List   Diagnosis Date Noted   Upper respiratory infection 05/24/2024   Atopic dermatitis 01/22/2024   Herpes labialis 11/28/2023   CAP (community acquired pneumonia) 11/21/2023   Sprain of left ankle 09/09/2023   Constipation 07/07/2023   History of colonic polyps 08/07/2022   Pain in right shoulder 08/24/2021   Clavicle pain 08/15/2021   Acute urinary tract infection 03/13/2021   Dysuria 03/13/2021   COVID-19 02/19/2021   Allergic rhinitis 10/24/2020   Anemia 10/24/2020   Anxiety 10/24/2020   Heart failure (HCC) 10/24/2020   History of sepsis 10/24/2020   Mixed hyperlipidemia 10/24/2020   Neck pain 10/24/2020   Osteopenia 10/24/2020   Overactive bladder 10/24/2020   Polyp of colon 10/24/2020   ESBL (extended spectrum beta-lactamase) producing bacteria infection 05/12/2020   Bacteremia due to Escherichia coli 05/12/2020   Severe sepsis (HCC)  05/12/2020   Debility 05/05/2020   Pressure injury of skin 04/24/2020   Palliative care by specialist    Pyelonephritis of left kidney 04/14/2020   Renal abscess, left 04/14/2020   Leukocytosis 04/14/2020   Fever 04/14/2020   LLQ abdominal pain 04/14/2020   AKI (acute kidney injury)    Sepsis secondary to UTI (HCC)    Pain in left ankle and joints of left foot 12/02/2019   History of epilepsy 08/04/2019   Positive colorectal cancer screening using Cologuard test 10/28/2018   Posterior  tibial tendinitis, right leg 04/23/2016   Pain in right foot 04/12/2016   Abnormal gait 11/13/2015   Cognitive communication disorder 11/13/2015   Coordination problem 11/13/2015   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   Generalized epilepsy (HCC) 11/13/2015   Muscle weakness 11/13/2015   Status epilepticus (HCC) 11/13/2015   Disorder of brain 11/13/2015   Anxiety disorder 11/13/2015   Dysphagia 11/13/2015   Pain in joint involving ankle and foot 11/13/2015   Acute encephalopathy    Myoclonus    Frequent falls 11/09/2015   Fall 11/09/2015   Chest pain 03/16/2014   Upper abdominal pain 03/16/2014   Seizure disorder (HCC) 03/16/2014   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 Authorizing Provider  aspirin  EC 81 MG tablet Take 1 tablet (81 mg total) by mouth 2 (two) times daily. To prevent blood clots for 14 days after surgery. 06/08/24   Gawne, Meghan M, PA-C  atorvastatin  (LIPITOR) 20 MG tablet Take 20 mg by mouth at bedtime. 08/24/18   [provider]  Calcium  Carb-Cholecalciferol (CALCIUM /VITAMIN D  PO) Take 1 tablet by mouth 2 (two) times daily. 600 mg / 800 units    [provider]  calcium  carbonate (TUMS - DOSED IN MG ELEMENTAL CALCIUM ) 500 MG chewable tablet Chew 1 tablet by mouth daily as needed for indigestion or heartburn.    [provider]  cholecalciferol (VITAMIN D3) 25 MCG (1000 UNIT) tablet Take 1,000 Units by mouth 2 (two) times daily.    [provider]  clonazePAM  (KLONOPIN ) 0.5 MG tablet Take 1 tablet (0.5 mg total) by mouth 2  (two) times daily. 12/22/23 06/19/24  Gregg Lek, MD  DEPAKOTE  500 MG DR tablet TAKE 2 TABLETS DAILY 04/19/24   Camara, Amadou, MD  diazePAM , 20 MG Dose, (VALTOCO  20 MG DOSE) 2 x 10 MG/0.1ML LQPK Place 20 mg into the nose as needed. For seizure, use two 10 mg devices, 1 into each nostril 06/02/24   Gayland Lauraine PARAS, NP  GEMTESA  75 MG TABS TAKE 1 TABLET DAILY 06/15/24   Matilda Senior, MD  HYDROcodone -acetaminophen  (NORCO) 10-325 MG tablet Take 1 tablet by mouth every 6 (six) hours as needed for severe pain (pain score 7-10). in shoulder after surgery for fracture 06/08/24   Gawne, Meghan M, PA-C  HYDROcodone -acetaminophen  (NORCO/VICODIN) 5-325 MG tablet Take 1 tablet by mouth every 6 (six) hours as needed for severe pain (pain score 7-10). 05/25/24   Roselyn Carlin NOVAK, MD  lacosamide  (VIMPAT ) 200 MG TABS tablet Take 1 tablet (200 mg total) by mouth 2 (two) times daily. 02/23/24   Camara, Amadou, MD  levETIRAcetam  (KEPPRA ) 750 MG tablet Take 1 tablet (750 mg total) by mouth 2 (two) times daily. 06/15/24   Gayland Lauraine PARAS, NP  meloxicam  (MOBIC ) 15 MG tablet Take 1 tablet (15 mg total) by mouth daily as needed for pain (and inflammation). 06/08/24   Gawne, Meghan M, PA-C  Multiple Vitamin (MULTIVITAMIN) capsule Take 1 capsule by mouth daily.    [provider]  omeprazole  (PRILOSEC) 40 MG capsule TAKE 1 CAPSULE DAILY (NEED OFFICE VISIT) 10/30/23   Eartha Flavors, Toribio, MD  ondansetron  (ZOFRAN -ODT) 4 MG disintegrating tablet Take 1 tablet (4 mg total) by mouth every 8 (eight) hours as needed for nausea or vomiting. 06/08/24   Gawne, Meghan M, PA-C  senna (SENOKOT) 8.6 MG tablet Take 2 tablets by mouth daily.    [provider]  QUEtiapine  (SEROQUEL ) 25 MG tablet Take 1 tablet (25 mg total) by mouth 2 (two) times daily. 05/31/20 06/06/20  Angiulli, Toribio PARAS, PA-C                                                                                                                                     Past Surgical History Past Surgical History:  Procedure Laterality Date   BIOPSY  09/16/2014   Procedure: BIOPSY;  Surgeon: Claudis RAYMOND Rivet, MD;  Location: [facility];  Service: Endoscopy;;   CATARACT EXTRACTION     both eyes, May of 2015   COLONOSCOPY     COLONOSCOPY WITH PROPOFOL  N/A 11/20/2018   Procedure: COLONOSCOPY WITH PROPOFOL ;  Surgeon: Rivet Claudis RAYMOND, MD;  Location: [facility];  Service: Endoscopy;  Laterality: N/A;   COLONOSCOPY WITH PROPOFOL  N/A 08/07/2022   Procedure: COLONOSCOPY WITH PROPOFOL ;  Surgeon: Eartha Flavors Toribio, MD;  Location: [facility];  Service: Gastroenterology;  Laterality: N/A;  7:30am, asa 1-2   ESOPHAGEAL DILATION N/A 09/16/2014   Procedure: ESOPHAGEAL DILATION WITH 54FR MALONEY DILATOR;  Surgeon: Claudis RAYMOND Rivet, MD;  Location: [facility];  Service: Endoscopy;  Laterality: N/A;   ESOPHAGOGASTRODUODENOSCOPY (EGD) WITH PROPOFOL  N/A 09/16/2014   Procedure: ESOPHAGOGASTRODUODENOSCOPY (EGD) WITH PROPOFOL ;  Surgeon: Claudis RAYMOND Rivet, MD;  Location: [facility];  Service: Endoscopy;  Laterality: N/A;   HEMOSTASIS CLIP PLACEMENT  08/07/2022   Procedure: HEMOSTASIS CLIP PLACEMENT;  Surgeon: Eartha Flavors Toribio, MD;  Location: [facility];  Service: Gastroenterology;;   LAPAROSCOPIC APPENDECTOMY N/A 07/14/2020   Procedure: APPENDECTOMY LAPAROSCOPIC;  Surgeon: Kallie Manuelita BROCKS, MD;  Location: [facility];  Service: General;  Laterality: N/A;   MOUTH SURGERY     ORIF CLAVICULAR FRACTURE Left 06/08/2024   Procedure: OPEN REDUCTION INTERNAL FIXATION (ORIF) CLAVICULAR FRACTURE WITH CORACLAVICULAR LIGAMENT REPAIR;  Surgeon: Beverley Evalene BIRCH, MD;  Location: [facility];  Service: Orthopedics;  Laterality: Left;  orif clavicle left, CC ligament reconstruction   POLYPECTOMY  11/20/2018   Procedure: POLYPECTOMY;  Surgeon: Rivet Claudis RAYMOND, MD;  Location: [facility];  Service: Endoscopy;;  colon   POLYPECTOMY  08/07/2022   Procedure: POLYPECTOMY;  Surgeon: Eartha Angelia Sieving,  MD;  Location: [facility];  Service: Gastroenterology;;   REVERSE SHOULDER ARTHROPLASTY Right 06/12/2023   Procedure: REVERSE SHOULDER ARTHROPLASTY;  Surgeon: Cristy Bonner DASEN, MD;  Location: [facility];  Service: Orthopedics;  Laterality: Right;   Skin graft to gum Right 08/2013   SUBMUCOSAL LIFTING INJECTION  08/07/2022   Procedure: SUBMUCOSAL LIFTING INJECTION;  Surgeon: Eartha Angelia Sieving, MD;  Location: [facility];  Service: Gastroenterology;;   TONSILLECTOMY AND ADENOIDECTOMY     TOTAL ABDOMINAL HYSTERECTOMY     Family History Family History  Problem Relation Age of Onset   High Cholesterol Mother    High blood pressure Mother    Diabetes Father     Social History Social History[1] Allergies Codeine and Lamotrigine  Review of Systems Review of Systems  All other systems reviewed and are negative.   Physical Exam Vital Signs  I have reviewed the triage vital signs BP 132/65   Pulse 78   Temp 98.2 F (36.8 C) (Oral)   Resp 12   Ht 5' 1 (1.549 m)   Wt 67.1 kg   SpO2 95%   BMI 27.96 kg/m  Physical Exam Vitals and nursing note reviewed.  Constitutional:      General: She is not in acute distress.    Appearance: She is well-developed.  HENT:     Head: Normocephalic and atraumatic.     Mouth/Throat:     Mouth: Mucous membranes are moist.  Eyes:     Pupils: Pupils are equal, round, and reactive to light.  Cardiovascular:     Rate and Rhythm: Normal rate and regular rhythm.     Heart sounds: No murmur heard. Pulmonary:     Effort: Pulmonary effort is normal. No respiratory distress.     Breath sounds: Normal breath sounds.  Abdominal:     General: Abdomen is flat.     Palpations: Abdomen is soft.     Tenderness: There is no abdominal tenderness.  Musculoskeletal:        General: No tenderness.     Right lower leg: No edema.     Left lower leg: No edema.  Skin:    General: Skin is warm and dry.  Neurological:     Mental Status: She is alert.      Comments: Slightly slow to respond but able to answer questions and follow commands, no cranial nerve deficit, moves all 4 extremities equally, strength 5 out of 5 in the bilateral upper and lower extremities.  Patient oriented x 4  Psychiatric:        Mood and Affect: Mood normal.        Behavior: Behavior normal.     ED Results and Treatments Labs (all labs ordered are listed, but only abnormal results are displayed) Labs Reviewed  COMPREHENSIVE METABOLIC PANEL WITH GFR - Abnormal; Notable for the following components:      Result Value   Glucose, Bld 124 (*)    Creatinine, Ser 1.08 (*)    GFR, Estimated 58 (*)    All other components within normal limits  RESP PANEL BY RT-PCR (RSV, FLU A&B, COVID)  RVPGX2  CBC WITH DIFFERENTIAL/PLATELET  MAGNESIUM   VALPROIC  ACID LEVEL  URINALYSIS, W/ REFLEX TO CULTURE (INFECTION SUSPECTED)  URINE DRUG SCREEN  LEVETIRACETAM  LEVEL  LACOSAMIDE   Radiology DG Chest Portable 1 View Result Date: 06/16/2024 EXAM: 1 VIEW(S) XRAY OF THE CHEST 06/16/2024 09:45:00 PM COMPARISON: 05/30/2024 CLINICAL HISTORY: weakness weakness weakness weakness FINDINGS: LUNGS AND PLEURA: No focal pulmonary opacity. No pleural effusion. No pneumothorax. HEART AND MEDIASTINUM: Aortic arch calcifications. No acute abnormality of the cardiac and mediastinal silhouettes. BONES AND SOFT TISSUES: Right shoulder prosthesis noted. ORIF distal left clavicle noted. No acute osseous abnormality. IMPRESSION: 1. No acute cardiopulmonary abnormality. Electronically signed by: Morgane Naveau MD 06/16/2024 09:50 PM EST RP Workstation: [id number]   CT Head Wo Contrast Result Date: 06/16/2024 EXAM: CT HEAD WITHOUT CONTRAST 06/16/2024 09:21:18 PM TECHNIQUE: CT of the head was performed without the administration of intravenous contrast. Automated exposure control, iterative  reconstruction, and/or weight based adjustment of the mA/kV was utilized to reduce the radiation dose to as low as reasonably achievable. COMPARISON: CT head 05/31/2024. CLINICAL HISTORY: Head trauma, coagulopathy (Age 47-64y); Headache, new onset (Age >= 51y). FINDINGS: BRAIN AND VENTRICLES: Right inferior frontal encephalomalacia. No acute hemorrhage. No evidence of acute infarct. No hydrocephalus. No extra-axial collection. No mass effect or midline shift. ORBITS: Bilateral lens replacement. SINUSES: No acute abnormality. SOFT TISSUES AND SKULL: Right periorbital soft tissue hematoma. No skull fracture. LUNGS: Bilateral lung replacement. IMPRESSION: 1. No acute intracranial abnormality. 2. Right periorbital soft tissue hematoma. Electronically signed by: Morgane Naveau MD 06/16/2024 09:29 PM EST RP Workstation: [id number]    Pertinent labs & imaging results that were available during my care of the patient were reviewed by me and considered in my medical decision making (see MDM for details).  Medications Ordered in ED Medications  sodium chloride  0.9 % bolus 1,000 mL (1,000 mLs Intravenous New Bag/Given 06/16/24 2158)  LORazepam  (ATIVAN ) injection 2 mg (2 mg Intravenous Given 06/16/24 2223)  levETIRAcetam  (KEPPRA ) undiluted injection 2,000 mg (2,000 mg Intravenous Given 06/16/24 2228)                                                                                                                                     Procedures .Critical Care  Performed by: Francesca Elsie CROME, MD Authorized by: Francesca Elsie CROME, MD   Critical care provider statement:    Critical care time (minutes):  30   Critical care was necessary to treat or prevent imminent or life-threatening deterioration of the following conditions:  CNS failure or compromise   Critical care was time spent personally by me on the following activities:  Development of treatment plan with patient or surrogate, discussions with consultants,  evaluation of patient's response to treatment, examination of patient, ordering and review of laboratory studies, ordering and review of radiographic studies, ordering and performing treatments and interventions, pulse oximetry, re-evaluation of patient's condition and review of old charts   (including critical care time)  Medical Decision Making / ED Course   MDM:  64 year old presenting to the emergency department with seizure frequency increased.  Differential could be  due to intracranial process, will obtain CT scan.  Did possibly hit head.  Will also obtain medication levels, sister who is patient's caregiver reports no missed doses she gives patient medication.  Depakote  level is reassuring.  Patient also had recent medication change which could be contributing.  Will check other testing including chest x-ray, urinalysis to evaluate for any occult infectious process.  Electrolytes are overall reassuring with some possible borderline slight dehydration.  Will observe.  Likely discussed with neurology given increased seizure frequency.  Clinical Course as of 06/16/24 2324  Wed Jun 16, 2024  2236 Patient noted to have 2 brief seizure while in the ER.  Will load with Keppra , patient has received Ativan  as well.  Seizure terminated prior to Ativan  administration.  Anticipate patient will require admission for further monitoring.  Have placed consult for neurology. [WS]  2320 Signed out to Dr. Bari pending teleneurology consult [WS]    Clinical Course User Index [WS] Francesca Elsie CROME, MD     Additional history obtained: -Additional history obtained from ems and spouse -External records from outside source obtained and reviewed including: Chart review including previous notes, labs, imaging, consultation notes including neurology notes    Lab Tests: -I ordered, reviewed, and interpreted labs.   The pertinent results include:   Labs Reviewed  COMPREHENSIVE METABOLIC PANEL WITH GFR  - Abnormal; Notable for the following components:      Result Value   Glucose, Bld 124 (*)    Creatinine, Ser 1.08 (*)    GFR, Estimated 58 (*)    All other components within normal limits  RESP PANEL BY RT-PCR (RSV, FLU A&B, COVID)  RVPGX2  CBC WITH DIFFERENTIAL/PLATELET  MAGNESIUM   VALPROIC  ACID LEVEL  URINALYSIS, W/ REFLEX TO CULTURE (INFECTION SUSPECTED)  URINE DRUG SCREEN  LEVETIRACETAM  LEVEL  LACOSAMIDE     Notable mild AKI   Imaging Studies ordered: I ordered imaging studies including CT head , xr  On my interpretation imaging demonstrates no acute process I independently visualized and interpreted imaging. I agree with the radiologist interpretation   Medicines ordered and prescription drug management: Meds ordered this encounter  Medications   sodium chloride  0.9 % bolus 1,000 mL   LORazepam  (ATIVAN ) injection 2 mg   levETIRAcetam  (KEPPRA ) undiluted injection 2,000 mg    -I have reviewed the patients home medicines and have made adjustments as needed    Reevaluation: After the interventions noted above, I reevaluated the patient and found that their symptoms have improved  Co morbidities that complicate the patient evaluation  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   Dysphagia    Dysuria 03/13/2021   ESBL (extended spectrum beta-lactamase) producing bacteria  infection 05/12/2020   Fall 11/09/2015   Fracture    R foot   GERD (gastroesophageal reflux disease)    Headache    Heart failure (HCC) 10/24/2020  Herpes labialis 11/28/2023   History of colonic polyps 08/07/2022   History of shingles 10/2016   Intellectual disability    Mild     Mental retardation    lesion in head   Myoclonus    Overactive bladder 10/24/2020   Pain in right shoulder 08/24/2021   Palliative care by specialist    Pneumonia    Positive colorectal cancer screening using Cologuard test 10/28/2018   Added automatically from request for surgery [id number]     Renal abscess, left 04/14/2020   Seizures (HCC)    Last seizure 05/31/23   Thrombocytopenia    Tremor       Dispostion: Disposition decision including need for hospitalization was considered, and patient disposition pending at time of sign out.    Final Clinical Impression(s) / ED Diagnoses Final diagnoses:  Increasing frequency of seizure activity (HCC)     This chart was dictated using voice recognition software.  Despite best efforts to proofread,  errors can occur which can change the documentation meaning.     [1]  Social History Tobacco Use   Smoking status: Former    Current packs/day: 0.00    Average packs/day: 1.5 packs/day for 15.0 years (22.5 ttl pk-yrs)    Types: Cigarettes    Start date: 05/23/1991    Quit date: 05/22/2006    Years since quitting: 18.0    Passive exposure: Never   Smokeless tobacco: Never  Vaping Use   Vaping status: Never Used  Substance Use Topics   Alcohol use: No    Alcohol/week: 0.0 standard drinks of alcohol   Drug use: No     Francesca Elsie CROME, MD 06/16/24 2324  "

## 2024-06-17 ENCOUNTER — Encounter (HOSPITAL_COMMUNITY): Payer: Self-pay

## 2024-06-17 ENCOUNTER — Encounter (HOSPITAL_COMMUNITY): Payer: Self-pay | Admitting: Internal Medicine

## 2024-06-17 ENCOUNTER — Inpatient Hospital Stay (HOSPITAL_COMMUNITY)

## 2024-06-17 DIAGNOSIS — Z79899 Other long term (current) drug therapy: Secondary | ICD-10-CM | POA: Diagnosis not present

## 2024-06-17 DIAGNOSIS — Z96611 Presence of right artificial shoulder joint: Secondary | ICD-10-CM | POA: Diagnosis present

## 2024-06-17 DIAGNOSIS — E785 Hyperlipidemia, unspecified: Secondary | ICD-10-CM | POA: Diagnosis present

## 2024-06-17 DIAGNOSIS — R296 Repeated falls: Secondary | ICD-10-CM | POA: Diagnosis present

## 2024-06-17 DIAGNOSIS — E860 Dehydration: Secondary | ICD-10-CM | POA: Diagnosis present

## 2024-06-17 DIAGNOSIS — Z888 Allergy status to other drugs, medicaments and biological substances status: Secondary | ICD-10-CM | POA: Diagnosis not present

## 2024-06-17 DIAGNOSIS — G9341 Metabolic encephalopathy: Secondary | ICD-10-CM | POA: Diagnosis present

## 2024-06-17 DIAGNOSIS — R569 Unspecified convulsions: Principal | ICD-10-CM

## 2024-06-17 DIAGNOSIS — Z7982 Long term (current) use of aspirin: Secondary | ICD-10-CM | POA: Diagnosis not present

## 2024-06-17 DIAGNOSIS — N179 Acute kidney failure, unspecified: Secondary | ICD-10-CM | POA: Diagnosis present

## 2024-06-17 DIAGNOSIS — F419 Anxiety disorder, unspecified: Secondary | ICD-10-CM | POA: Diagnosis present

## 2024-06-17 DIAGNOSIS — Z8616 Personal history of COVID-19: Secondary | ICD-10-CM | POA: Diagnosis not present

## 2024-06-17 DIAGNOSIS — F70 Mild intellectual disabilities: Secondary | ICD-10-CM | POA: Diagnosis present

## 2024-06-17 DIAGNOSIS — S42002A Fracture of unspecified part of left clavicle, initial encounter for closed fracture: Secondary | ICD-10-CM | POA: Diagnosis present

## 2024-06-17 DIAGNOSIS — N390 Urinary tract infection, site not specified: Secondary | ICD-10-CM | POA: Diagnosis not present

## 2024-06-17 DIAGNOSIS — Z885 Allergy status to narcotic agent status: Secondary | ICD-10-CM | POA: Diagnosis not present

## 2024-06-17 DIAGNOSIS — G40909 Epilepsy, unspecified, not intractable, without status epilepticus: Secondary | ICD-10-CM | POA: Diagnosis present

## 2024-06-17 DIAGNOSIS — Z87891 Personal history of nicotine dependence: Secondary | ICD-10-CM | POA: Diagnosis not present

## 2024-06-17 DIAGNOSIS — Z1152 Encounter for screening for COVID-19: Secondary | ICD-10-CM | POA: Diagnosis not present

## 2024-06-17 DIAGNOSIS — X58XXXA Exposure to other specified factors, initial encounter: Secondary | ICD-10-CM | POA: Diagnosis present

## 2024-06-17 DIAGNOSIS — Z23 Encounter for immunization: Secondary | ICD-10-CM | POA: Diagnosis present

## 2024-06-17 DIAGNOSIS — Z833 Family history of diabetes mellitus: Secondary | ICD-10-CM | POA: Diagnosis not present

## 2024-06-17 DIAGNOSIS — Z90710 Acquired absence of both cervix and uterus: Secondary | ICD-10-CM | POA: Diagnosis not present

## 2024-06-17 LAB — BASIC METABOLIC PANEL WITH GFR
Anion gap: 11 (ref 5–15)
BUN: 20 mg/dL (ref 8–23)
CO2: 26 mmol/L (ref 22–32)
Calcium: 8.7 mg/dL — ABNORMAL LOW (ref 8.9–10.3)
Chloride: 105 mmol/L (ref 98–111)
Creatinine, Ser: 0.86 mg/dL (ref 0.44–1.00)
GFR, Estimated: >60 mL/min
Glucose, Bld: 96 mg/dL (ref 70–99)
Potassium: 4.2 mmol/L (ref 3.5–5.1)
Sodium: 142 mmol/L (ref 135–145)

## 2024-06-17 LAB — PHOSPHORUS: Phosphorus: 3.1 mg/dL (ref 2.5–4.6)

## 2024-06-17 LAB — URINALYSIS, W/ REFLEX TO CULTURE (INFECTION SUSPECTED)
Bilirubin Urine: NEGATIVE
Glucose, UA: NEGATIVE mg/dL
Hgb urine dipstick: NEGATIVE
Ketones, ur: NEGATIVE mg/dL
Nitrite: NEGATIVE
Protein, ur: 100 mg/dL — AB
Specific Gravity, Urine: 1.029 (ref 1.005–1.030)
WBC, UA: >50 WBC/hpf (ref 0–5)
pH: 5 (ref 5.0–8.0)

## 2024-06-17 LAB — CBC
HCT: 34.5 % — ABNORMAL LOW (ref 36.0–46.0)
Hemoglobin: 10.9 g/dL — ABNORMAL LOW (ref 12.0–15.0)
MCH: 30.3 pg (ref 26.0–34.0)
MCHC: 31.6 g/dL (ref 30.0–36.0)
MCV: 95.8 fL (ref 80.0–100.0)
Platelets: 186 K/uL (ref 150–400)
RBC: 3.6 MIL/uL — ABNORMAL LOW (ref 3.87–5.11)
RDW: 14.1 % (ref 11.5–15.5)
WBC: 7.4 K/uL (ref 4.0–10.5)
nRBC: 0 % (ref 0.0–0.2)

## 2024-06-17 LAB — MAGNESIUM: Magnesium: 2 mg/dL (ref 1.7–2.4)

## 2024-06-17 MED ORDER — CHLORHEXIDINE GLUCONATE CLOTH 2 % EX PADS
6.0000 | MEDICATED_PAD | Freq: Every day | CUTANEOUS | Status: DC
  Administered 2024-06-17 – 2024-06-19 (×2): 6 via TOPICAL

## 2024-06-17 MED ORDER — HYDROMORPHONE HCL 1 MG/ML IJ SOLN
0.5000 mg | INTRAMUSCULAR | Status: DC | PRN
  Administered 2024-06-18: 0.5 mg via INTRAVENOUS
  Filled 2024-06-17: qty 0.5

## 2024-06-17 MED ORDER — DIVALPROEX SODIUM 250 MG PO DR TAB: 1000.0000 mg | DELAYED_RELEASE_TABLET | Freq: Every day | ORAL | Status: DC

## 2024-06-17 MED ORDER — CLONAZEPAM 0.5 MG PO TABS
0.5000 mg | ORAL_TABLET | Freq: Two times a day (BID) | ORAL | Status: DC
  Administered 2024-06-17 (×2): 0.5 mg via ORAL
  Filled 2024-06-17 (×2): qty 1

## 2024-06-17 MED ORDER — LEVETIRACETAM (KEPPRA) 500 MG/5 ML ADULT IV PUSH
1000.0000 mg | Freq: Two times a day (BID) | INTRAVENOUS | Status: DC
  Filled 2024-06-17: qty 10

## 2024-06-17 MED ORDER — LACOSAMIDE 50 MG PO TABS
100.0000 mg | ORAL_TABLET | Freq: Two times a day (BID) | ORAL | Status: DC
  Administered 2024-06-17 – 2024-06-19 (×4): 100 mg via ORAL
  Filled 2024-06-17 (×4): qty 2

## 2024-06-17 MED ORDER — VALPROATE SODIUM 100 MG/ML IV SOLN
500.0000 mg | Freq: Two times a day (BID) | INTRAVENOUS | Status: DC
  Filled 2024-06-17: qty 5

## 2024-06-17 MED ORDER — SODIUM CHLORIDE 0.9 % IV SOLN
2.0000 g | INTRAVENOUS | Status: DC
  Administered 2024-06-18 – 2024-06-19 (×3): 2 g via INTRAVENOUS
  Filled 2024-06-17 (×3): qty 20

## 2024-06-17 MED ORDER — MELATONIN 3 MG PO TABS
6.0000 mg | ORAL_TABLET | Freq: Every evening | ORAL | Status: DC | PRN
  Administered 2024-06-17 – 2024-06-18 (×2): 6 mg via ORAL
  Filled 2024-06-17 (×2): qty 2

## 2024-06-17 MED ORDER — SODIUM CHLORIDE 0.9 % IV SOLN: INTRAVENOUS | Status: AC

## 2024-06-17 MED ORDER — LORAZEPAM 2 MG/ML IJ SOLN
2.0000 mg | Freq: Four times a day (QID) | INTRAMUSCULAR | Status: DC | PRN
  Administered 2024-06-17: 2 mg via INTRAVENOUS
  Filled 2024-06-17: qty 1

## 2024-06-17 MED ORDER — ACETAMINOPHEN 325 MG PO TABS: 650.0000 mg | ORAL_TABLET | Freq: Four times a day (QID) | ORAL | Status: DC | PRN

## 2024-06-17 MED ORDER — CLONAZEPAM 0.5 MG PO TABS
0.5000 mg | ORAL_TABLET | Freq: Three times a day (TID) | ORAL | Status: DC
  Administered 2024-06-17 – 2024-06-19 (×6): 0.5 mg via ORAL
  Filled 2024-06-17 (×6): qty 1

## 2024-06-17 MED ORDER — LEVETIRACETAM (KEPPRA) 500 MG/5 ML ADULT IV PUSH
1000.0000 mg | Freq: Two times a day (BID) | INTRAVENOUS | Status: DC
  Administered 2024-06-17: 1000 mg via INTRAVENOUS

## 2024-06-17 MED ORDER — INFLUENZA VIRUS VACC SPLIT PF (FLUZONE) 0.5 ML IM SUSY
0.5000 mL | PREFILLED_SYRINGE | INTRAMUSCULAR | Status: AC
  Administered 2024-06-19: 0.5 mL via INTRAMUSCULAR
  Filled 2024-06-17 (×2): qty 0.5

## 2024-06-17 MED ORDER — SODIUM CHLORIDE 0.9 % IV SOLN
2.0000 g | Freq: Once | INTRAVENOUS | Status: AC
  Administered 2024-06-17: 2 g via INTRAVENOUS
  Filled 2024-06-17: qty 20

## 2024-06-17 MED ORDER — VALPROATE SODIUM 100 MG/ML IV SOLN
500.0000 mg | Freq: Two times a day (BID) | INTRAVENOUS | Status: DC
  Administered 2024-06-17 – 2024-06-19 (×5): 500 mg via INTRAVENOUS
  Filled 2024-06-17 (×4): qty 5
  Filled 2024-06-17 (×3): qty 500
  Filled 2024-06-17 (×4): qty 5

## 2024-06-17 MED ORDER — LEVETIRACETAM 500 MG PO TABS
1000.0000 mg | ORAL_TABLET | Freq: Two times a day (BID) | ORAL | Status: DC
  Administered 2024-06-17: 1000 mg via ORAL
  Filled 2024-06-17: qty 2

## 2024-06-17 MED ORDER — OXYCODONE HCL 5 MG PO TABS
5.0000 mg | ORAL_TABLET | ORAL | Status: DC | PRN
  Administered 2024-06-17: 10 mg via ORAL
  Filled 2024-06-17: qty 2

## 2024-06-17 MED ORDER — LACOSAMIDE 50 MG PO TABS
200.0000 mg | ORAL_TABLET | ORAL | Status: AC
  Administered 2024-06-17: 200 mg via ORAL
  Filled 2024-06-17: qty 4

## 2024-06-17 MED ORDER — POLYETHYLENE GLYCOL 3350 17 G PO PACK: 17.0000 g | PACK | Freq: Every day | ORAL | Status: DC | PRN

## 2024-06-17 MED ORDER — LORAZEPAM 2 MG/ML IJ SOLN
2.0000 mg | INTRAMUSCULAR | Status: DC | PRN
  Administered 2024-06-17 – 2024-06-18 (×2): 2 mg via INTRAVENOUS
  Filled 2024-06-17 (×2): qty 1

## 2024-06-17 MED ORDER — PROCHLORPERAZINE EDISYLATE 10 MG/2ML IJ SOLN: 5.0000 mg | Freq: Four times a day (QID) | INTRAMUSCULAR | Status: DC | PRN

## 2024-06-17 MED ORDER — ASPIRIN 81 MG PO TBEC
81.0000 mg | DELAYED_RELEASE_TABLET | Freq: Two times a day (BID) | ORAL | Status: DC
  Administered 2024-06-17 – 2024-06-19 (×6): 81 mg via ORAL
  Filled 2024-06-17 (×6): qty 1

## 2024-06-17 MED ORDER — LEVETIRACETAM (KEPPRA) 500 MG/5 ML ADULT IV PUSH: 500.0000 mg | Freq: Once | INTRAVENOUS | Status: DC

## 2024-06-17 NOTE — Evaluation (Signed)
 Occupational Therapy Evaluation Patient Details Name: Maria [name] MRN: [id number] DOB: Dec 17, 1960 Today's Date: 06/17/2024   History of Present Illness   Maria [name] is a 63 y.o. female with medical history significant for seizure disorder generalized tonic-clonic seizures, multiple recent falls, ambulatory dysfunction uses a walker at baseline, chronic anxiety, who presents with complaints of increasing frequency of breakthrough seizures.  The patient had good seizure control up until May 25, 2024 when she had a fall, which she attributed to dizziness.  She had a series of fall events after that.  She was weaned off her home Vimpat  and started on Keppra .  Vimpat  was eventually discontinued about 1 week ago.       There were concerns about her falls being a side effect of Vimpat  toxicity.  The patient had multiple falls including a fall that led to left clavicle fracture on 05/25/2024, and another fall on 05/30/2024 that led to right periorbital contusion.  In the past 2 days, she has had more seizures, multiple breakthrough seizures, 4 to 5 today, prior to presenting to the ER.  The patient lives at home with her sister. (per DO)     Clinical Impressions Pt agreeable to OT and PT co-evaluation. Sister present and provided much of history given that the pt was not fully oriented today. Some difficulty following commands, but typically could follow command with extended time. Min A for bed mobility with HOB elevated today. Min A for functional mobility with RW. L UE limited due to recent clavicle fracture and surgery. Pt demonstrating need of mod to max A for lower body ADL's and min to mod for upper body dressing/bathing. Pt left in the chair with chair alarm set. Pt will benefit from continued OT in the hospital to increase strength, balance, and endurance for safe ADL's.        If plan is discharge home, recommend the following:   A little help with walking and/or transfers;A lot  of help with bathing/dressing/bathroom;Assistance with cooking/housework;Assist for transportation;Help with stairs or ramp for entrance;Supervision due to cognitive status;Direct supervision/assist for medications management     Functional Status Assessment   Patient has had a recent decline in their functional status and demonstrates the ability to make significant improvements in function in a reasonable and predictable amount of time.     Equipment Recommendations   None recommended by OT             Precautions/Restrictions   Precautions Precautions: Fall Recall of Precautions/Restrictions: Impaired Precaution/Restrictions Comments: Recent L clavical fracture. Pt should be wearing sling but cannot tolerate due to many recent seizures. Restrictions Weight Bearing Restrictions Per Provider Order: No     Mobility Bed Mobility Overal bed mobility: Needs Assistance Bed Mobility: Supine to Sit     Supine to sit: Min assist, HOB elevated     General bed mobility comments: labored effort; assist to rise to sitting; cuing to follow directions.    Transfers Overall transfer level: Needs assistance Equipment used: Rolling walker (2 wheels) Transfers: Sit to/from Stand, Bed to chair/wheelchair/BSC Sit to Stand: Min assist     Step pivot transfers: Min assist     General transfer comment: Pt fell slow and assisted back to bed during first attempt at sit to stand with RW; min A for EOB to chair.      Balance Overall balance assessment: Needs assistance Sitting-balance support: No upper extremity supported, Feet supported Sitting balance-Leahy Scale: Fair Sitting balance - Comments: seated  at EOB   Standing balance support: Bilateral upper extremity supported, During functional activity Standing balance-Leahy Scale: Poor Standing balance comment: Fair with RW; cuing needed to manage RW                           ADL either performed or assessed with  clinical judgement   ADL Overall ADL's : Needs assistance/impaired     Grooming: Sitting;Minimal assistance   Upper Body Bathing: Minimal assistance;Moderate assistance;Sitting   Lower Body Bathing: Maximal assistance;Sitting/lateral leans;Moderate assistance   Upper Body Dressing : Minimal assistance;Moderate assistance;Sitting   Lower Body Dressing: Maximal assistance;Moderate assistance;Bed level Lower Body Dressing Details (indicate cue type and reason): Attempted to manage sock at bed level but struggled and required assist. Toilet Transfer: Minimal assistance;Rolling walker (2 wheels) Toilet Transfer Details (indicate cue type and reason): EOB to chair RW Toileting- Clothing Manipulation and Hygiene: Moderate assistance;Sitting/lateral lean;Sit to/from stand       Functional mobility during ADLs: Minimal assistance;Rolling walker (2 wheels) General ADL Comments: Able to ambulate in hall with RW a short distance.     Vision Baseline Vision/History: 1 Wears glasses Ability to See in Adequate Light: 0 Adequate Patient Visual Report: No change from baseline Vision Assessment?: Wears glasses for reading     Perception Perception: Not tested       Praxis Praxis: Not tested       Pertinent Vitals/Pain Pain Assessment Pain Assessment: Faces Faces Pain Scale: Hurts a little bit Pain Location: chest Pain Descriptors / Indicators: Discomfort Pain Intervention(s): Monitored during session, Repositioned     Extremity/Trunk Assessment Upper Extremity Assessment Upper Extremity Assessment: LUE deficits/detail;RUE deficits/detail RUE Deficits / Details: Generally weak; 3+/5 gross grasp. Difficult to fully assess due to cognitive deficits. LUE Deficits / Details: L clavicle fracture and surgery recently. 2+/5 MMT shoulder; generally weak.   Lower Extremity Assessment Lower Extremity Assessment: Defer to PT evaluation   Cervical / Trunk Assessment Cervical / Trunk  Assessment: Normal   Communication Communication Communication: Impaired Factors Affecting Communication: Difficulty expressing self   Cognition Arousal: Alert Behavior During Therapy: WFL for tasks assessed/performed Cognition: History of cognitive impairments, Cognition impaired   Orientation impairments: Time, Situation         OT - Cognition Comments: Sister reports the pt has some cognitive impairements at baseline but has been more out of it in the last several days.                 Following commands: Impaired Following commands impaired: Follows one step commands inconsistently     Cueing  General Comments   Cueing Techniques: Verbal cues;Tactile cues;Gestural cues                 Home Living Family/patient expects to be discharged to:: Private residence Living Arrangements: Other relatives Available Help at Discharge: Family;Available 24 hours/day (Pt home alone at times typically, but sister reports that she can have 24/7 assist for a week or two if needed with the help of the pt's nephew.) Type of Home: House Home Access: Stairs to enter [employer] of Steps: 1 Entrance Stairs-Rails: Right Home Layout: One level (lives on one level)     Bathroom Shower/Tub: Producer, Television/film/video: Handicapped height Bathroom Accessibility: Yes How Accessible: Accessible via walker Home Equipment: Wheelchair - Nurse, Children's (2 wheels);Other (comment);Grab bars - toilet;Grab bars - tub/shower;Shower seat - built in (adjustable bed)   Additional Comments: Lives with  sister.      Prior Functioning/Environment Prior Level of Function : Needs assist;History of Falls (last six months)       Physical Assist : ADLs (physical)   ADLs (physical): IADLs Mobility Comments: Household ambulation with RW sometimes. Pt also goes without the RW and leans on objects. ADLs Comments: Typically independent with ADL's; pt can do  dishes. Some IADL assist.    OT Problem List: Decreased strength;Decreased activity tolerance;Decreased range of motion;Impaired balance (sitting and/or standing);Decreased cognition;Decreased safety awareness;Decreased knowledge of use of DME or AE;Decreased knowledge of precautions;Impaired UE functional use   OT Treatment/Interventions: Self-care/ADL training;Therapeutic exercise;Therapeutic activities;Patient/family education;Balance training;DME and/or AE instruction;Cognitive remediation/compensation      OT Goals(Current goals can be found in the care plan section)   Acute Rehab OT Goals Patient Stated Goal: Return home. OT Goal Formulation: With family Time For Goal Achievement: 07/01/24 Potential to Achieve Goals: Good   OT Frequency:  Min 2X/week    Co-evaluation PT/OT/SLP Co-Evaluation/Treatment: Yes Reason for Co-Treatment: To address functional/ADL transfers   OT goals addressed during session: ADL's and self-care                       End of Session Equipment Utilized During Treatment: Rolling walker (2 wheels);Gait belt  Activity Tolerance: Patient tolerated treatment well Patient left: in chair;with call bell/phone within reach;with chair alarm set;with family/visitor present  OT Visit Diagnosis: Unsteadiness on feet (R26.81);Other abnormalities of gait and mobility (R26.89);Muscle weakness (generalized) (M62.81);History of falling (Z91.81);Other symptoms and signs involving cognitive function;Repeated falls (R29.6)                Time: 9076-9050 OT Time Calculation (min): 26 min Charges:  OT General Charges $OT Visit: 1 Visit OT Evaluation $OT Eval Low Complexity: 1 Low  Bonner Larue OT, MOT  Jayson Person 06/17/2024, 11:54 AM

## 2024-06-17 NOTE — Consult Note (Signed)
 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 Patient's epilepsy had previously been well-controlled on a combination of Vimpat , Depakote , and Klonopin . However, since December 23, she has had a significant increase in frequency of breakthrough seizures, one of which resulting in a clavicular fracture. Patient was seen by her neurologist on 06/02/2024 and due to concern that her falls may be secondary to toxic Vimpat  levels, this medication was tapered off and Keppra  was started, which the patient is currently taking at a dose of 750 mg twice daily. Despite this adjustment, the patient continues to have frequent breakthrough seizures, with 5 seizures being recorded over the course of the day on 06/16/2024.  In terms of workup, CT head was negative for any acute intracranial findings. In terms of lab work, her white blood cell count was normal, she did not have any significant electrolyte derangements, and her respiratory panel was negative. Her urinalysis, however, did show large leukocyte esterase and rare bacteria, suggestive of a urinary tract infection in the context of urinary hesitancy. I suspect that this is the most likely the cause for her increased seizure frequency, although obtaining a routine EEG to ensure that she is not having a new seizure focus would be appropriate at this time. Would recommend treatment with antibiotics for her suspected UTI. In addition, would recommend  increasing her Keppra  to 1 g twice daily for the time being; this can be adjusted by her neurologist in the outpatient setting if seizure frequency returns to normal with treatment of her UTI.   Recommendations: - Initiate empiric treatment for suspected urinary tract infection and follow urine cultures - Routine EEG - Increase Keppra  to 1 g twice daily - Seizure precautions while inpatient - Neurochecks every 4 hours - Follow-up antiseizure medication levels  Seizure precautions : Seizure precautions including no driving for state mandated time frame should be discussed with patient prior to discharge  DVT Prophylaxis : Choice of Primary Team  Disposition : Neurology will follow    ----------------------------------------------------------------------------------------------------    Metrics: Callback Response Time: 06/16/2024 22:02:18  Primary Provider Notified of Diagnostic Impression and Management Plan on: 06/17/2024 01:28:03   CT HEAD: I personally reviewed all the CT images that were available to me and it showed: no acute intracranial pathology.   Labs Urinalysis with large leukocyte esterase and rare bacteria Levels for Keppra , Vimpat , and Depakote  levels pending WBC 9.1 Hemoglobin 12.0 Platelets 192 Sodium 142 Glucose 124 Creatinine 1.08  Depakote  level (05/30/2024): 59 Ammonia level (05/30/2024): 18   ----------------------------------------------------------------------------------------------------  Chief Complaint: Seizures  History of Present Illness: The patient is a 64 year old female with a history of epilepsy and mild intellectual disability who presents to the emergency department accompanied by her sister this evening for evaluation of increased frequency of seizures. History is chiefly obtained via discussion with the patient's sister and chart review, as the patient is generally somnolent. Sister explains that the patient had been  seizure-free since September up until December 23, when she had her first fall. She explains that the  patient had 5 falls that week, one of which resulting in a broken collarbone. Sister suspects that these falls were related to seizures, as she saw at least 2 of them occur in front of her, and there was seizure activity.  Patient was seen by her neurologist on 06/02/2024, at which time there was some concern that the patient's Vimpat  may be causing side effects contributing to her falls. Patient has had high Vimpat  levels in the past, with her most recent level being 18.3 on 12/22/2023. As such, the patient was weaned off of her Vimpat  and started on Keppra , which she has been taking at a dose of 750 mg twice daily. No adjustment was made to the patient's Depakote  or Klonopin . Sister relates that the patient has been off of her Vimpat  and fully on her Keppra  for at least the past week. Sister is in charge of the patient's medications and denies any recently missed doses.  Unfortunately, despite the adjustment made to the patient's medications, she has continued to have frequent seizures. Sister relates that she has had at least 1 seizure per day over the past few days, and has had 5 seizures today. EMS was initially activated to the patient's home earlier today after the patient had a seizure and told her sister that she could not catch her breath. Patient ultimately did not come to the hospital at that time, as EMS checked her out and felt that she was fine. However, after the patient had multiple subsequent seizures, the decision was made to have the patient brought to the emergency department for further evaluation.  Sister explains that all of the patient's seizures have been generalized tonic-clonic seizures in nature. They generally last around 1 minute before self resolving. After the seizure, the patient is confused.  Sister denies noticing any new weakness or neurologic deficits over recent weeks.  She has noticed that the patient has been having some hesitancy with urination recently. Otherwise, sister has not noticed any cough, fevers, chills, or other obvious infectious symptoms.    Past Medical History:      Seizures Other PMH:  Mild intellectual disability  Medications:  No Anticoagulant use  No Antiplatelet use Reviewed EMR for current medications Other Medications Pertinent To Assessment Include:  Keppra   Depakote   Klonopin   Allergies:  Reviewed  Social History: Drug Use: No  ROS : 14 Points Review of Systems was performed and was negative except mentioned in HPI.  Past Surgical History: There Is No Surgical History Contributory To Todays Visit    Examination: BP(123/59), Pulse(78), Blood Glucose(124) 1A: Level of Consciousness - Alert; keenly responsive + 0 1B: Ask Month and Age - Both Questions Right + 0 1C: Blink Eyes & Squeeze Hands - Performs Both Tasks + 0 2: Test Horizontal Extraocular Movements - Normal + 0 3: Test Visual Fields - No Visual Loss + 0 4: Test Facial Palsy (Use Grimace if Obtunded) - Normal symmetry + 0 5A: Test Left Arm Motor Drift - No Drift for 10 Seconds + 0 5B: Test Right Arm Motor Drift - No Drift for 10 Seconds + 0 6A: Test Left Leg Motor Drift - Drift, but doesn't hit bed + 1 6B: Test Right Leg Motor Drift - Drift, but doesn't hit bed + 1 7: Test Limb Ataxia (FNF/Heel-Shin) - No Ataxia + 0 8: Test Sensation - Normal; No sensory loss + 0 9: Test Language/Aphasia - Normal; No aphasia + 0 10: Test Dysarthria - Normal + 0 11: Test  Extinction/Inattention - No abnormality + 0  NIHSS Score: 2 NIHSS Free Text : Patient is somnolent but arouses to voice alone. She is oriented to her age as well as the month. Follows commands, although intermittently requires repeated prompting. EOMI. Visual fields are full. Face is symmetric at rest and with activation. No drift in bilateral upper extremities. Mild drift in bilateral lower  extremities. No dysmetria with finger-to-nose testing bilaterally. Sensation to light touch is intact and symmetric throughout the upper and lower extremities as well as the face. Naming and repetition are intact. No dysarthria. No neglect.  Spoke with : Dr. Bari    This consult was conducted in real time using interactive audio and immunologist. Patient was informed of the technology being used for this visit and agreed to proceed. Patient located in hospital and provider located at home/office setting.   Patient is being evaluated for possible acute neurologic impairment and high probability of imminent or life - threatening deterioration.I spent total of 60 minutes providing care to this patient, including time for face to face visit via telemedicine, review of medical records, imaging studies and discussion of findings with providers, the patient and / or family.   Dr Reyes Sick     [facility] For Inpatient follow-up with [facility] physician please call [facility] at [phone]. As we are not an outpatient service for any post hospital discharge needs please contact the hospital for assistance.  If you have any questions for the [facility] physicians or need to reconsult for clinical or diagnostic changes please contact us  via [facility] at [phone].  Non-radiologist review of imaging performed to assist with emergent clinical decision-making. Remote physician workstations do not possess the same resolution, calibration, or diagnostic capabilities as hospital-based radiology reading stations, and formal radiologist read is necessary.   Signature : Reyes Sick, MD

## 2024-06-17 NOTE — Progress Notes (Signed)
 The patient had another clonic tonic seizure event around 5:19 AM lasting about a minute.  2 mg of IV Ativan  was administered.  Her sister was present in the room.  Presented at bedside.  The patient is arousable and follows command.  She is protecting her airway.  P.o. Depakote  and p.o. Keppra  have been switched to IV.  Discussed the case with Dr. Michaela via phone.  Okay to continue with IV Keppra , IV Depakote , and as needed IV Ativan  for breakthrough seizures.    If there is recurrent seizures or concern for subclinical seizure consider transferring to [facility] for possible LTM EEG.   Time: 15 minutes.

## 2024-06-17 NOTE — Progress Notes (Signed)
 Patient admitted after midnight, please see H&P from Dr. Shona.  After admission it appears patient had a seizure around 5 AM requiring 2 mg of IV Ativan .  Patient again had 3 seizures around 12:15 PM requiring Ativan .  I have reached out to [facility].  She agrees with plan to transfer to [facility] for LTM EEG. Possible UTI-continue IV antibiotics- await culture. Maria Bowl DO

## 2024-06-17 NOTE — Plan of Care (Signed)

## 2024-06-17 NOTE — Plan of Care (Signed)
" °  Problem: Acute Rehab PT Goals(only PT should resolve) Goal: Pt Will Go Supine/Side To Sit Outcome: Progressing Flowsheets (Taken 06/17/2024 1449) Pt will go Supine/Side to Sit:  with modified independence  Independently Goal: Patient Will Transfer Sit To/From Stand Outcome: Progressing Flowsheets (Taken 06/17/2024 1449) Patient will transfer sit to/from stand:  with supervision  with contact guard assist Goal: Pt Will Transfer Bed To Chair/Chair To Bed Outcome: Progressing Flowsheets (Taken 06/17/2024 1449) Pt will Transfer Bed to Chair/Chair to Bed:  with contact guard assist  with supervision Goal: Pt Will Ambulate Outcome: Progressing Flowsheets (Taken 06/17/2024 1449) Pt will Ambulate:  75 feet  with supervision  with contact guard assist  with rolling walker   2:53 PM, 06/17/24 Lynwood Music, MPT Physical Therapist with [facility] 336 [phone] office [phone] mobile phone  "

## 2024-06-17 NOTE — TOC Initial Note (Signed)
 Transition of Care [facility]) - Initial/Assessment Note    Patient Details  Name: Maria [name] MRN: [id number] Date of Birth: 09-09-1960  Transition of Care [facility]) CM/SW Contact:    Noreen KATHEE Pinal, LCSWA Phone Number: 06/17/2024, 11:53 AM  Clinical Narrative:                  CSW spoke with patient and her sister at bedside to complete assessment and discuss PT recommendation for HHPT. Patient sister is supportive and assist with ADL's as needed. Sister reports that patient has equipment like a walker, but she does not use them.  Sister reports that patient is not alone and she stays with her. They  had no HH agency preference- Art[facility] with [facility] accepted referral for HHPT and sister made aware. CSW will continue to follow.   Expected Discharge Plan: Home w Home Health Services Barriers to Discharge: Continued Medical Work up   Patient Goals and CMS Choice Patient states their goals for this hospitalization and ongoing recovery are:: get better   Choice offered to / list presented to : Patient, Sibling      Expected Discharge Plan and Services In-house Referral: Clinical Social Work   Post Acute Care Choice: Durable Medical Equipment Living arrangements for the past 2 months: Single Family Home                           HH Arranged: PT HH Agency: [facility] Date HH Agency Contacted: 06/17/24 Time HH Agency Contacted: 1147 Representative spoke with at [facility] Agency: Artavia  Prior Living Arrangements/Services Living arrangements for the past 2 months: Single Family Home Lives with:: Siblings, Self Patient language and need for interpreter reviewed:: Yes Do you feel safe going back to the place where you live?: Yes      Need for Family Participation in Patient Care: Yes (Comment) Care giver support system in place?: No (comment) Current home services: DME    Activities of Daily Living   ADL Screening (condition at time of  admission) Independently performs ADLs?: Yes (appropriate for developmental age) Is the patient deaf or have difficulty hearing?: No Does the patient have difficulty seeing, even when wearing glasses/contacts?: No Does the patient have difficulty concentrating, remembering, or making decisions?: Yes  Permission Sought/Granted      Share Information with NAME: Henchy and Katheryn     Permission granted to share info w Relationship: Patient and sister     Emotional Assessment Appearance:: Appears stated age Attitude/Demeanor/Rapport: Engaged Affect (typically observed): Apprehensive Orientation: : Oriented to Self, Oriented to Situation, Oriented to Place Alcohol / Substance Use: Not Applicable Psych Involvement: No (comment)  Admission diagnosis:  Increasing frequency of seizure activity (HCC) [R56.9] Acute cystitis without hematuria [N30.00] Patient Active Problem List   Diagnosis Date Noted   Increasing frequency of seizure activity (HCC) 06/17/2024   Upper respiratory infection 05/24/2024   Atopic dermatitis 01/22/2024   Herpes labialis 11/28/2023   CAP (community acquired pneumonia) 11/21/2023   Sprain of left ankle 09/09/2023   Constipation 07/07/2023   History of colonic polyps 08/07/2022   Pain in right shoulder 08/24/2021   Clavicle pain 08/15/2021   Acute urinary tract infection 03/13/2021   Dysuria 03/13/2021   COVID-19 02/19/2021   Allergic rhinitis 10/24/2020   Anemia 10/24/2020   Anxiety 10/24/2020   Heart failure (HCC) 10/24/2020   History of sepsis 10/24/2020   Mixed hyperlipidemia 10/24/2020   Neck pain 10/24/2020  Osteopenia 10/24/2020   Overactive bladder 10/24/2020   Polyp of colon 10/24/2020   ESBL (extended spectrum beta-lactamase) producing bacteria infection 05/12/2020   Bacteremia due to Escherichia coli 05/12/2020   Severe sepsis (HCC) 05/12/2020   Debility 05/05/2020   Pressure injury of skin 04/24/2020   Palliative care by specialist     Pyelonephritis of left kidney 04/14/2020   Renal abscess, left 04/14/2020   Leukocytosis 04/14/2020   Fever 04/14/2020   LLQ abdominal pain 04/14/2020   AKI (acute kidney injury)    Sepsis secondary to UTI (HCC)    Pain in left ankle and joints of left foot 12/02/2019   History of epilepsy 08/04/2019   Positive colorectal cancer screening using Cologuard test 10/28/2018   Posterior tibial tendinitis, right leg 04/23/2016   Pain in right foot 04/12/2016   Abnormal gait 11/13/2015   Cognitive communication disorder 11/13/2015   Coordination problem 11/13/2015   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   Generalized epilepsy (HCC) 11/13/2015   Muscle weakness 11/13/2015   Status epilepticus (HCC) 11/13/2015   Disorder of brain 11/13/2015   Anxiety disorder 11/13/2015   Dysphagia 11/13/2015   Pain in joint involving ankle and foot 11/13/2015   Acute encephalopathy    Myoclonus    Frequent falls 11/09/2015   Fall 11/09/2015   Chest pain 03/16/2014   Upper abdominal pain 03/16/2014   Seizure disorder (HCC) 03/16/2014   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 [place] [address] Phone: [phone] Fax: [phone]  [facility] [phone] - [place], [place] - [address] AT [facility] [address] [place] [address] Phone: [phone] Fax: [phone]     Social Drivers of Health (SDOH) Social History: SDOH Screenings   Food Insecurity: No Food Insecurity (06/17/2024)  Housing: Low Risk  (06/17/2024)  Transportation Needs: No Transportation Needs (06/17/2024)  Utilities: Not At Risk (06/17/2024)  Depression (PHQ2-9): Low Risk (12/02/2023)  Tobacco Use: Medium Risk (06/17/2024)   SDOH Interventions:     Readmission Risk Interventions    06/17/2024   11:37 AM 11/24/2023    9:51 AM  Readmission Risk Prevention Plan  Medication Screening Complete   Transportation Screening Complete Complete  Home Care Screening  Complete  Medication Review (RN CM)  Complete

## 2024-06-17 NOTE — Progress Notes (Signed)
 Dr Terry Hurst and NP Erminio Cone notified that patient had seizure.  PRN ativan  given.

## 2024-06-17 NOTE — Plan of Care (Signed)
" °  Problem: Acute Rehab OT Goals (only OT should resolve) Goal: Pt. Will Perform Grooming Flowsheets (Taken 06/17/2024 1158) Pt Will Perform Grooming:  with modified independence  standing Goal: Pt. Will Perform Lower Body Bathing Flowsheets (Taken 06/17/2024 1158) Pt Will Perform Lower Body Bathing:  with contact guard assist  sitting/lateral leans Goal: Pt. Will Perform Upper Body Dressing Flowsheets (Taken 06/17/2024 1158) Pt Will Perform Upper Body Dressing: with modified independence Goal: Pt. Will Perform Lower Body Dressing Flowsheets (Taken 06/17/2024 1158) Pt Will Perform Lower Body Dressing:  with contact guard assist  sitting/lateral leans Goal: Pt. Will Transfer To Toilet Flowsheets (Taken 06/17/2024 1158) Pt Will Transfer to Toilet:  with modified independence  with supervision  ambulating Goal: Pt. Will Perform Toileting-Clothing Manipulation Flowsheets (Taken 06/17/2024 1158) Pt Will Perform Toileting - Clothing Manipulation and hygiene:  with supervision  sitting/lateral leans  sit to/from stand Goal: Pt/Caregiver Will Perform Home Exercise Program Flowsheets (Taken 06/17/2024 1158) Pt/caregiver will Perform Home Exercise Program:  Increased strength  Right Upper extremity  Independently  Tynan Boesel OT, MOT  "

## 2024-06-17 NOTE — H&P (Addendum)
 " History and Physical  Maria [name] FMW:[id number] DOB: [birth year] DOA: 06/16/2024  Referring physician: Dr. Bari, EDP  PCP: [name], MD  Outpatient Specialists: Neurology Patient coming from: Home  Chief Complaint: Increasing frequency and breakthrough seizures.  HPI: Maria [name] is a 64 y.o. female with medical history significant for seizure disorder generalized tonic-clonic seizures, multiple recent falls, ambulatory dysfunction uses a walker at baseline, chronic anxiety, who presents with complaints of increasing frequency of breakthrough seizures.  The patient had good seizure control up until May 25, 2024 when she had a fall, which she attributed to dizziness.  She had a series of fall events after that.  She was weaned off her home Vimpat  and started on Keppra .  Vimpat  was eventually discontinued about 1 week ago.    There were concerns about her falls being a side effect of Vimpat  toxicity.  The patient had multiple falls including a fall that led to left clavicle fracture on 05/25/2024, and another fall on 05/30/2024 that led to right periorbital contusion.  In the past 2 days, she has had more seizures, multiple breakthrough seizures, 4 to 5 today, prior to presenting to the ER.  The patient lives at home with her sister.  In the ER, at approximately 10:18 PM, the patient had a witnessed seizure by staff, with whole body convulsing.  It lasted approximately 45 seconds.  The patient had another breakthrough seizure around 10:20 p.m.  Acute urinary retention.  Foley was placed in the ER.  UA positive for pyuria.  The patient received 2 g of IV Rocephin , IV Keppra  load 2 g x 1, IV NS bolus 1 L x 1, and IV Ativan  2 mg x 1.  EDP discussed the case with teleneurology who recommended admission for routine EEG and adjustment of dose of home Keppra .  Dose of Keppra  increased to 1000 mg twice daily.  She will need to have a close follow up appointment with her neurologist  outpatient after discharge.  TRH, hospitalist service, was asked to admit.  ED Course: Temperature 97.6.  BP 128/65, pulse 76, respiratory rate 13, O2 saturation 96% on room air.  Review of Systems: Review of systems as noted in the HPI. All other systems reviewed and are negative.   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   Dysphagia    Dysuria 03/13/2021   ESBL (extended spectrum beta-lactamase) producing bacteria infection 05/12/2020   Fall 11/09/2015   Fracture    R foot   GERD (gastroesophageal reflux disease)    Headache    Heart failure (HCC) 10/24/2020   Herpes labialis 11/28/2023   History of colonic polyps 08/07/2022   History of shingles 10/2016   Intellectual disability    Mild     Mental retardation    lesion in head   Myoclonus    Overactive bladder 10/24/2020  Pain in right shoulder 08/24/2021   Palliative care by specialist    Pneumonia    Positive colorectal cancer screening using Cologuard test 10/28/2018   Added automatically from request for surgery [id number]     Renal abscess, left 04/14/2020   Seizures (HCC)    Last seizure 05/31/23   Thrombocytopenia    Tremor    Past Surgical History:  Procedure Laterality Date   BIOPSY  09/16/2014   Procedure: BIOPSY;  Surgeon: Claudis RAYMOND Rivet, MD;  Location: [facility];  Service: Endoscopy;;   CATARACT EXTRACTION      both eyes, May of 2015   COLONOSCOPY     COLONOSCOPY WITH PROPOFOL  N/A 11/20/2018   Procedure: COLONOSCOPY WITH PROPOFOL ;  Surgeon: Rivet Claudis RAYMOND, MD;  Location: [facility];  Service: Endoscopy;  Laterality: N/A;   COLONOSCOPY WITH PROPOFOL  N/A 08/07/2022   Procedure: COLONOSCOPY WITH PROPOFOL ;  Surgeon: Eartha Angelia Sieving, MD;  Location: [facility];  Service: Gastroenterology;  Laterality: N/A;  7:30am, asa 1-2   ESOPHAGEAL DILATION N/A 09/16/2014   Procedure: ESOPHAGEAL DILATION WITH 54FR MALONEY DILATOR;  Surgeon: Claudis RAYMOND Rivet, MD;  Location: [facility];  Service: Endoscopy;  Laterality: N/A;   ESOPHAGOGASTRODUODENOSCOPY (EGD) WITH PROPOFOL  N/A 09/16/2014   Procedure: ESOPHAGOGASTRODUODENOSCOPY (EGD) WITH PROPOFOL ;  Surgeon: Claudis RAYMOND Rivet, MD;  Location: [facility];  Service: Endoscopy;  Laterality: N/A;   HEMOSTASIS CLIP PLACEMENT  08/07/2022   Procedure: HEMOSTASIS CLIP PLACEMENT;  Surgeon: Eartha Angelia Sieving, MD;  [facility]: [facility];  Service: Gastroenterology;;   LAPAROSCOPIC APPENDECTOMY N/A 07/14/2020   Procedure: APPENDECTOMY LAPAROSCOPIC;  Surgeon: Kallie Manuelita BROCKS, MD;  Location: [facility];  Service: General;  Laterality: N/A;   MOUTH SURGERY     ORIF CLAVICULAR FRACTURE Left 06/08/2024   Procedure: OPEN REDUCTION INTERNAL FIXATION (ORIF) CLAVICULAR FRACTURE WITH CORACLAVICULAR LIGAMENT REPAIR;  Surgeon: Beverley Evalene BIRCH, MD;  Location: [facility];  Service: Orthopedics;  Laterality: Left;  orif clavicle left, CC ligament reconstruction   POLYPECTOMY  11/20/2018   Procedure: POLYPECTOMY;  Surgeon: Rivet Claudis RAYMOND, MD;  [facility]: [facility];  Service: Endoscopy;;  colon   POLYPECTOMY  08/07/2022   Procedure: POLYPECTOMY;  Surgeon: Eartha Angelia Sieving, MD;  [facility]: [facility];  Service: Gastroenterology;;   REVERSE SHOULDER ARTHROPLASTY Right 06/12/2023   Procedure: REVERSE SHOULDER ARTHROPLASTY;  Surgeon: Cristy Bonner DASEN, MD;  Location: [facility];  Service:  Orthopedics;  Laterality: Right;   Skin graft to gum Right 08/2013   SUBMUCOSAL LIFTING INJECTION  08/07/2022   Procedure: SUBMUCOSAL LIFTING INJECTION;  Surgeon: Eartha Angelia Sieving, MD;  [facility]: [facility];  Service: Gastroenterology;;   TONSILLECTOMY AND ADENOIDECTOMY     TOTAL ABDOMINAL HYSTERECTOMY      Social History:  reports that she quit smoking about 18 years ago. Her smoking use included cigarettes. She started smoking about 33 years ago. She has a 22.5 pack-year smoking history. She has never been exposed to tobacco smoke. She has never used smokeless tobacco. She reports that she does not drink alcohol and does not use drugs.   Allergies[1]  Family History  Problem Relation Age of Onset   High Cholesterol Mother    High blood pressure Mother    Diabetes Father       Prior to Admission medications  Medication Sig Start Date End Date Taking? Authorizing Provider  aspirin  EC 81 MG tablet Take 1 tablet (81 mg total) by mouth 2 (two) times daily. To prevent blood clots  for 14 days after surgery. 06/08/24   Gawne, Meghan M, PA-C  atorvastatin  (LIPITOR) 20 MG tablet Take 20 mg by mouth at bedtime. 08/24/18   [provider]  Calcium  Carb-Cholecalciferol (CALCIUM /VITAMIN D  PO) Take 1 tablet by mouth 2 (two) times daily. 600 mg / 800 units    [provider]  calcium  carbonate (TUMS - DOSED IN MG ELEMENTAL CALCIUM ) 500 MG chewable tablet Chew 1 tablet by mouth daily as needed for indigestion or heartburn.    [provider]  cholecalciferol (VITAMIN D3) 25 MCG (1000 UNIT) tablet Take 1,000 Units by mouth 2 (two) times daily.    [provider]  clonazePAM  (KLONOPIN ) 0.5 MG tablet Take 1 tablet (0.5 mg total) by mouth 2 (two) times daily. 12/22/23 06/19/24  Gregg Lek, MD  DEPAKOTE  500 MG DR tablet TAKE 2 TABLETS DAILY 04/19/24   Camara, Amadou, MD  diazePAM , 20 MG Dose, (VALTOCO  20 MG DOSE) 2 x 10 MG/0.1ML LQPK Place 20 mg into the nose as  needed. For seizure, use two 10 mg devices, 1 into each nostril 06/02/24   Gayland Lauraine PARAS, NP  GEMTESA  75 MG TABS TAKE 1 TABLET DAILY 06/15/24   Matilda Senior, MD  HYDROcodone -acetaminophen  (NORCO) 10-325 MG tablet Take 1 tablet by mouth every 6 (six) hours as needed for severe pain (pain score 7-10). in shoulder after surgery for fracture 06/08/24   Gawne, Meghan M, PA-C  HYDROcodone -acetaminophen  (NORCO/VICODIN) 5-325 MG tablet Take 1 tablet by mouth every 6 (six) hours as needed for severe pain (pain score 7-10). 05/25/24   Roselyn Carlin NOVAK, MD  lacosamide  (VIMPAT ) 200 MG TABS tablet Take 1 tablet (200 mg total) by mouth 2 (two) times daily. 02/23/24   Camara, Amadou, MD  levETIRAcetam  (KEPPRA ) 750 MG tablet Take 1 tablet (750 mg total) by mouth 2 (two) times daily. 06/15/24   Gayland Lauraine PARAS, NP  meloxicam  (MOBIC ) 15 MG tablet Take 1 tablet (15 mg total) by mouth daily as needed for pain (and inflammation). 06/08/24   Gawne, Meghan M, PA-C  Multiple Vitamin (MULTIVITAMIN) capsule Take 1 capsule by mouth daily.    [provider]  omeprazole  (PRILOSEC) 40 MG capsule TAKE 1 CAPSULE DAILY (NEED OFFICE VISIT) 10/30/23   Eartha Flavors, Toribio, MD  ondansetron  (ZOFRAN -ODT) 4 MG disintegrating tablet Take 1 tablet (4 mg total) by mouth every 8 (eight) hours as needed for nausea or vomiting. 06/08/24   Gawne, Meghan M, PA-C  senna (SENOKOT) 8.6 MG tablet Take 2 tablets by mouth daily.    [provider]  QUEtiapine  (SEROQUEL ) 25 MG tablet Take 1 tablet (25 mg total) by mouth 2 (two) times daily. 05/31/20 06/06/20  Angiulli, Toribio PARAS, PA-C    Physical Exam: BP (!) 121/55 (BP Location: Left Arm)   Pulse 68   Temp 98.1 F (36.7 C) (Oral)   Resp 16   Ht 5' 5 (1.651 m)   Wt 64.4 kg   SpO2 100%   BMI 23.63 kg/m   General: 64 y.o. year-old female well developed well nourished in no acute distress.  Alert and somnolent. Cardiovascular: Regular rate and rhythm with no rubs or gallops.   No thyromegaly or JVD noted.  No lower extremity edema. 2/4 pulses in all 4 extremities. Respiratory: Clear to auscultation with no wheezes or rales. Good inspiratory effort. Abdomen: Soft nontender nondistended with normal bowel sounds x4 quadrants. Muskuloskeletal: No cyanosis, clubbing or edema noted bilaterally Neuro: CN II-XII intact, strength, sensation, reflexes Skin: No  ulcerative lesions noted or rashes Psychiatry: Judgement and insight appear normal. Mood is appropriate for condition and setting          Labs on Admission:  Basic Metabolic Panel: Recent Labs  Lab 06/16/24 2045 06/17/24 0355  NA 142 142  K 4.1 4.2  CL 105 105  CO2 22 26  GLUCOSE 124* 96  BUN 22 20  CREATININE 1.08* 0.86  CALCIUM  9.9 8.7*  MG 1.9 2.0  PHOS  --  3.1   Liver Function Tests: Recent Labs  Lab 06/16/24 2045  AST 22  ALT 15  ALKPHOS 113  BILITOT 0.4  PROT 6.8  ALBUMIN  4.2   No results for input(s): LIPASE, AMYLASE in the last 168 hours. No results for input(s): AMMONIA in the last 168 hours. CBC: Recent Labs  Lab 06/16/24 2045 06/17/24 0355  WBC 9.1 7.4  NEUTROABS 6.6  --   HGB 12.0 10.9*  HCT 37.1 34.5*  MCV 93.0 95.8  PLT 192 186   Cardiac Enzymes: No results for input(s): CKTOTAL, CKMB, CKMBINDEX, TROPONINI in the last 168 hours.  BNP (last 3 results) No results for input(s): BNP in the last 8760 hours.  ProBNP (last 3 results) No results for input(s): PROBNP in the last 8760 hours.  CBG: No results for input(s): GLUCAP in the last 168 hours.  Radiological Exams on Admission: DG Chest Portable 1 View Result Date: 06/16/2024 EXAM: 1 VIEW(S) XRAY OF THE CHEST 06/16/2024 09:45:00 PM COMPARISON: 05/30/2024 CLINICAL HISTORY: weakness weakness weakness weakness FINDINGS: LUNGS AND PLEURA: No focal pulmonary opacity. No pleural effusion. No pneumothorax. HEART AND MEDIASTINUM: Aortic arch calcifications. No acute abnormality of the cardiac and  mediastinal silhouettes. BONES AND SOFT TISSUES: Right shoulder prosthesis noted. ORIF distal left clavicle noted. No acute osseous abnormality. IMPRESSION: 1. No acute cardiopulmonary abnormality. Electronically signed by: Morgane Naveau MD 06/16/2024 09:50 PM EST RP Workstation: [id number]   CT Head Wo Contrast Result Date: 06/16/2024 EXAM: CT HEAD WITHOUT CONTRAST 06/16/2024 09:21:18 PM TECHNIQUE: CT of the head was performed without the administration of intravenous contrast. Automated exposure control, iterative reconstruction, and/or weight based adjustment of the mA/kV was utilized to reduce the radiation dose to as low as reasonably achievable. COMPARISON: CT head 05/31/2024. CLINICAL HISTORY: Head trauma, coagulopathy (Age 58-64y); Headache, new onset (Age >= 51y). FINDINGS: BRAIN AND VENTRICLES: Right inferior frontal encephalomalacia. No acute hemorrhage. No evidence of acute infarct. No hydrocephalus. No extra-axial collection. No mass effect or midline shift. ORBITS: Bilateral lens replacement. SINUSES: No acute abnormality. SOFT TISSUES AND SKULL: Right periorbital soft tissue hematoma. No skull fracture. LUNGS: Bilateral lung replacement. IMPRESSION: 1. No acute intracranial abnormality. 2. Right periorbital soft tissue hematoma. Electronically signed by: Morgane Naveau MD 06/16/2024 09:29 PM EST RP Workstation: [id number]    EKG: I independently viewed the EKG done and my findings are as followed: None available at the time of this visit.  Assessment/Plan Present on Admission: **None**  Principal Problem:   Increasing frequency of seizure activity (HCC)  Increasing frequency of seizure activity, POA Noncontrast head CT, without acute findings. Home dose of Keppra  increased to 1000 mg twice daily Resume home Depakote  Follow routine EEG Follow Vimpat , Depakote , and Keppra  levels Seizure precautions Neurochecks every 4 hours Fall precautions. IV Ativan  as needed for breakthrough  seizures. The patient will need to have a close follow up appointment with her neurologist outpatient after discharge.  Frequent multiple falls, POA Was thought to be secondary to Vimpat  toxicity which can cause dizziness  Has been off Vimpat  for about a week Ambulates with a walker at baseline PT OT evaluation Fall precautions  AKI, prerenal secondary to dehydration from poor oral intake Baseline creatinine 0.6 with GFR greater than 60 Presented with creatinine of 1.08 with GFR 58 IV fluid hydration NS at 75 cc/h x 1 day. Monitor urine output Repeat BMP in the morning  Acute urinary retention, POA Could not void in the ER Foley catheter inserted 06/17/2024. Hold off home Gemtesa  as it can cause urinary retention  Presumed UTI, POA UA positive for pyuria Follow urine culture for ID and sensitivities Continue Rocephin  empirically De-escalate antibiotics when able.  Chronic anxiety Resume home regimen  Hyperlipidemia Resume home Lipitor  Recent surgery, left open reduction internal fixation of clavicle fracture with coracoclavicular ligament repair, POA Pain control as needed Prior to admission, on aspirin  81 mg twice daily for DVT prophylaxis per orthopedic surgery Change DVT prophylaxis to subcu Lovenox  daily, if the patient stays more than 1 midnight.   Critical care time: 55 minutes.   Addendum: The patient had another seizure while on the floor.  P.o. Depakote  and p.o. Keppra  were switched to IV.   DVT prophylaxis: SCDs.  Code Status: Full code.  Family Communication: The patient's sister at bedside.  Disposition Plan: Admit to telemetry unit.  Consults called: Teleneurology consulted by EDP.  Admission status: Observation status.   Status is: Observation    Terry LOISE Hurst MD [facility] Pager [phone]  If 7PM-7AM, please contact night-coverage [email or web address] Password [facility]  06/17/2024, 5:38 AM      [1]  Allergies Allergen Reactions    Codeine Nausea And Vomiting and Nausea Only    Other Reaction(s): Not available  codeine   Lamotrigine Rash and Dermatitis    Other Reaction(s): Systemic vasculitis (code- [id number], SNOMED CT)   "

## 2024-06-17 NOTE — ED Provider Notes (Signed)
 Patient signed out pending workup and teleneurology evaluation.  Increase seizure activity after being fairly well-controlled.  Labs obtained.  No metabolic derangements.  She does have large leuk esterase with greater than 50 white cells.  Urine culture is pending.  Patient given IV Rocephin .  See discussion with teleneuro below.  Physical Exam  BP (!) 123/59 (BP Location: Left Arm)   Pulse 77   Temp 98.2 F (36.8 C) (Oral)   Resp 12   Ht 1.549 m (5' 1)   Wt 67.1 kg   SpO2 97%   BMI 27.96 kg/m   ED Course / MDM   Clinical Course as of 06/17/24 0131  Wed Jun 16, 2024  2236 Patient noted to have 2 brief seizure while in the ER.  Will load with Keppra , patient has received Ativan  as well.  Seizure terminated prior to Ativan  administration.  Anticipate patient will require admission for further monitoring.  Have placed consult for neurology. [WS]  2320 Signed out to Dr. Bari pending teleneurology consult [WS]  Thu Jun 17, 2024  0130 Spoke to teleneurology.  Recommends treating UTI as this could be culprit and increase seizure activity.  Patient given dose of Rocephin .  Also recommends increasing Keppra  to 1 g twice daily and admitting for routine EEG. [CH]    Clinical Course User Index [CH] Parlee Amescua, Charmaine FALCON, MD [WS] Francesca Elsie CROME, MD   Medical Decision Making Amount and/or Complexity of Data Reviewed Labs: ordered. Radiology: ordered.  Risk Prescription drug management.          Bari Charmaine FALCON, MD 06/17/24 [phone]

## 2024-06-17 NOTE — Consult Note (Signed)
 I connected with  Maria [name] on 06/17/24 by a video enabled telemedicine application and verified that I am speaking with the correct person using two identifiers.   I discussed the limitations of evaluation and management by telemedicine. The patient expressed understanding and agreed to proceed.  Location of patient: [facility] Location of physician: [facility]   Neurology Consultation Reason for Consult: Seizures Referring Physician: Dr. Elsie Body  CC: Seizures  History is obtained from: Patient, sister at bedside, chart review  HPI: Maria [name] is a 64 y.o. female with history of intellectual disability and epilepsy who was brought in due to increased frequency of seizures.  Per patient sister at bedside, at baseline she had grade seizures, last seizure being in September of last year.  However for the last 4 to 6 weeks, she has had significant increase seizure frequency.  Last month she had a few falls.  She was seen outpatient neurology and there was concern that falls could be related to Vimpat  use.  Therefore Vimpat  was switched to Keppra .  However persistent bedside since being on Keppra  she has not had any many falls.  However her seizure frequency is significantly worsened.  She had 5 seizures yesterday before coming to ER and has had 3 seizures since being in the hospital.   ROS: All other systems reviewed and negative except as noted in the HPI.  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   Dysphagia    Dysuria 03/13/2021   ESBL (extended spectrum beta-lactamase) producing bacteria infection 05/12/2020   Fall 11/09/2015   Fracture    R foot   GERD (gastroesophageal reflux disease)    Headache    Heart failure (HCC) 10/24/2020   Herpes labialis 11/28/2023   History of colonic polyps 08/07/2022   History of shingles 10/2016   Intellectual disability    Mild     Mental retardation    lesion in head   Myoclonus    Overactive bladder 10/24/2020   Pain in right shoulder 08/24/2021   Palliative care by specialist    Pneumonia    Positive colorectal cancer screening using Cologuard test 10/28/2018   Added automatically from request for surgery [id number]     Renal abscess, left 04/14/2020   Seizures (HCC)    Last seizure 05/31/23   Thrombocytopenia    Tremor     Family History  Problem Relation Age of Onset   High Cholesterol Mother    High blood pressure Mother    Diabetes Father      Social History:  reports that she quit smoking about 18 years ago. Her smoking use included cigarettes. She started smoking about 33 years ago. She has a 22.5 pack-year smoking history. She has never been exposed to tobacco smoke. She has never used smokeless tobacco. She reports that she does not  drink alcohol and does not use drugs.   Medications Prior to Admission  Medication Sig Dispense Refill Last Dose/Taking   aspirin  EC 81 MG tablet Take 1 tablet (81 mg total) by mouth 2 (two) times daily. To prevent blood clots for 14 days after surgery. 28 tablet 0 Past Week   atorvastatin  (LIPITOR) 20 MG tablet Take 20 mg by mouth at bedtime.   06/16/2024 at  9:00 PM   Calcium  Carb-Cholecalciferol (CALCIUM /VITAMIN D  PO) Take 1 tablet by mouth 2 (two) times daily. 600 mg / 800 units   06/16/2024 Morning   cholecalciferol (VITAMIN D3) 25 MCG (1000  UNIT) tablet Take 1,000 Units by mouth 2 (two) times daily.   06/16/2024 Morning   clonazePAM  (KLONOPIN ) 0.5 MG tablet Take 1 tablet (0.5 mg total) by mouth 2 (two) times daily. 60 tablet 5 06/16/2024 Morning   DEPAKOTE  500 MG DR tablet TAKE 2 TABLETS DAILY (Patient taking differently: Take 500 mg by mouth 2 (two) times daily.) 180 tablet 3 06/16/2024   diazePAM , 20 MG Dose, (VALTOCO  20 MG DOSE) 2 x 10 MG/0.1ML LQPK Place 20 mg into the nose as needed. For seizure, use two 10 mg devices, 1 into each nostril 5 each 1 Unknown   GEMTESA  75 MG TABS TAKE 1 TABLET DAILY 90 tablet 3 06/16/2024 at  1:00 AM   HYDROcodone -acetaminophen  (NORCO) 10-325 MG tablet Take 1 tablet by mouth every 6 (six) hours as needed for severe pain (pain score 7-10). in shoulder after surgery for fracture 28 tablet 0 06/16/2024 at  1:00 AM   levETIRAcetam  (KEPPRA ) 750 MG tablet Take 1 tablet (750 mg total) by mouth 2 (two) times daily. 60 tablet 5 06/16/2024 at 10:00 AM   meloxicam  (MOBIC ) 15 MG tablet Take 1 tablet (15 mg total) by mouth daily as needed for pain (and inflammation). 30 tablet 0 06/16/2024 at 10:00 AM   Multiple Vitamin (MULTIVITAMIN) capsule Take 1 capsule by mouth daily.   06/16/2024 at 10:00 AM   omeprazole  (PRILOSEC) 40 MG capsule TAKE 1 CAPSULE DAILY (NEED OFFICE VISIT) 90 capsule 3 06/16/2024 at 10:00 AM   ondansetron  (ZOFRAN -ODT) 4 MG disintegrating tablet Take 1 tablet (4 mg total) by mouth every 8 (eight) hours as needed for nausea or vomiting. 15 tablet 0 Unknown   senna (SENOKOT) 8.6 MG tablet Take 2 tablets by mouth daily.   06/16/2024 at  9:00 PM   calcium  carbonate (TUMS - DOSED IN MG ELEMENTAL CALCIUM ) 500 MG chewable tablet Chew 1 tablet by mouth daily as needed for indigestion or heartburn.      HYDROcodone -acetaminophen  (NORCO/VICODIN) 5-325 MG tablet Take 1 tablet by mouth every 6 (six) hours as needed for severe pain (pain score 7-10). 12 tablet 0    lacosamide  (VIMPAT ) 200 MG TABS tablet Take 1 tablet (200  mg total) by mouth 2 (two) times daily. 180 tablet 3       Exam: Current vital signs: BP 117/67 (BP Location: Right Arm)   Pulse 85   Temp 98.2 F (36.8 C) (Oral)   Resp 18   Ht 5' 5 (1.651 m)   Wt 64.4 kg   SpO2 99%   BMI 23.63 kg/m  Vital signs in last 24 hours: Temp:  [97.6 F (36.4 C)-98.2 F (36.8 C)] 98.2 F (36.8 C) (01/15 1223) Pulse Rate:  [68-90] 85 (01/15 1223) Resp:  [10-18] 18 (01/15 1223) BP: (110-137)/(53-81) 117/67 (01/15 1223) SpO2:  [92 %-100 %] 99 % (01/15 1223) Weight:  [64.4 kg-67.1  kg] 64.4 kg (01/15 0339)   Physical Exam  Constitutional: Appears well-developed and well-nourished.  Psych: Affect appropriate to situation Neuro: AO x 3, cranials grossly intact, antigravity without drift in all 4 extremities  I have reviewed labs in epic and the results pertinent to this consultation are: CBC:  Recent Labs  Lab 06/16/24 2045 06/17/24 0355  WBC 9.1 7.4  NEUTROABS 6.6  --   HGB 12.0 10.9*  HCT 37.1 34.5*  MCV 93.0 95.8  PLT 192 186    Basic Metabolic Panel:  Lab Results  Component Value Date   NA 142 06/17/2024   K 4.2 06/17/2024   CO2 26 06/17/2024   GLUCOSE 96 06/17/2024   BUN 20 06/17/2024   CREATININE 0.86 06/17/2024   CALCIUM  8.7 (L) 06/17/2024   GFRNONAA >60 06/17/2024   GFRAA >60 06/11/2017   Lipid Panel:  Lab Results  Component Value Date   LDLCALC (H) 10/13/2009    108        Total Cholesterol/HDL:CHD Risk Coronary Heart Disease Risk Table                     Men   Women  1/2 Average Risk   3.4   3.3  Average Risk       5.0   4.4  2 X Average Risk   9.6   7.1  3 X Average Risk  23.4   11.0        Use the calculated Patient Ratio above and the CHD Risk Table to determine the patient's CHD Risk.        ATP III CLASSIFICATION (LDL):  <100     mg/dL   Optimal  899-870  mg/dL   Near or Above                    Optimal  130-159  mg/dL   Borderline  839-810  mg/dL   High  >809     mg/dL   Very High   YhaJ8r:   Lab Results  Component Value Date   HGBA1C 5.7 (H) 04/17/2020   Urine Drug Screen:     Component Value Date/Time   LABOPIA POSITIVE (A) 06/16/2024 2253   COCAINSCRNUR NEGATIVE 06/16/2024 2253   LABBENZ POSITIVE (A) 06/16/2024 2253   AMPHETMU NEGATIVE 06/16/2024 2253   THCU NEGATIVE 06/16/2024 2253   LABBARB NEGATIVE 06/16/2024 2253    Alcohol Level     Component Value Date/Time   [facility]  09/14/2009 1920    <5        LOWEST DETECTABLE LIMIT FOR SERUM ALCOHOL IS 5 mg/dL FOR MEDICAL PURPOSES ONLY     I have reviewed the images obtained:  CT Head without contrast 06/16/2024: No acute intracranial abnormality. Right periorbital soft tissue hematoma.   ASSESSMENT/PLAN: 64 year old female with history of epilepsy now with increased to continue breakthrough seizures.  Epilepsy with breakthrough seizure -Increased recurrence of seizure most likely in the setting of UTI - Per sister, seizure frequency has worsened since being on Keppra .  Therefore for now we will switch back to Vimpat .  Will give one-time dose of Vimpat  200 mg and start 100 mg twice daily - Will also increase Klonopin  to 0.5 mg 3 times daily for now - Continue current dose of Depakote  -Please transfer to [facility] for long-term EEG- -continue seizure precautions -as needed IV Versed  or Ativan  2 mg for clinical seizures lasting more than 2 minutes -Discussed plan with patient  sister at bedside -Discussed plan with hospitalist via secure chat - Please notify neurohospitalist at [facility] once patient is here.   Thank you for allowing us  to participate in the care of this patient. If you have any further questions, please contact  me or neurohospitalist.       Arlin Krebs Epile[facility] neurohospitalist

## 2024-06-17 NOTE — ED Notes (Signed)
 Teleneuro consult in progress

## 2024-06-17 NOTE — Evaluation (Signed)
 Physical Therapy Evaluation Patient Details Name: Maria [name] MRN: [id number] DOB: 1961-03-24 Today's Date: 06/17/2024  History of Present Illness  Maria [name] is a 64 y.o. female with medical history significant for seizure disorder generalized tonic-clonic seizures, multiple recent falls, ambulatory dysfunction uses a walker at baseline, chronic anxiety, who presents with complaints of increasing frequency of breakthrough seizures.  The patient had good seizure control up until May 25, 2024 when she had a fall, which she attributed to dizziness.  She had a series of fall events after that.  She was weaned off her home Vimpat  and started on Keppra .  Vimpat  was eventually discontinued about 1 week ago.       There were concerns about her falls being a side effect of Vimpat  toxicity.  The patient had multiple falls including a fall that led to left clavicle fracture on 05/25/2024, and another fall on 05/30/2024 that led to right periorbital contusion.  In the past 2 days, she has had more seizures, multiple breakthrough seizures, 4 to 5 today, prior to presenting to the ER.  The patient lives at home with her sister.   Clinical Impression  Patient demonstrates fair/good return for sitting up at bedside with [facility] partially raised, very unsteady on feet during transfer to chair and tolerated ambulating in room, hallway with slow labored movement and followed with w/c for safety. Patient tolerated sitting up in chair after therapy. Patient will benefit from continued skilled physical therapy in hospital and recommended venue below to increase strength, balance, endurance for safe ADLs and gait.          If plan is discharge home, recommend the following: A little help with walking and/or transfers;A little help with bathing/dressing/bathroom;Help with stairs or ramp for entrance;Assist for transportation;Assistance with cooking/housework   Can travel by private vehicle        Equipment  Recommendations None recommended by PT  Recommendations for Other Services       Functional Status Assessment Patient has had a recent decline in their functional status and demonstrates the ability to make significant improvements in function in a reasonable and predictable amount of time.     Precautions / Restrictions Precautions Precautions: Fall Recall of Precautions/Restrictions: Impaired Precaution/Restrictions Comments: Recent L clavical fracture. Pt should be wearing sling but cannot tolerate due to many recent seizures. Restrictions Weight Bearing Restrictions Per Provider Order: No      Mobility  Bed Mobility Overal bed mobility: Needs Assistance Bed Mobility: Supine to Sit     Supine to sit: Min assist, HOB elevated     General bed mobility comments: increased time, labored movement    Transfers Overall transfer level: Needs assistance Equipment used: Rolling walker (2 wheels) Transfers: Sit to/from Stand, Bed to chair/wheelchair/BSC Sit to Stand: Min assist   Step pivot transfers: Min assist       General transfer comment: unsteady labored movment requiring repeated attempts for completing sit to stands due to weakness    Ambulation/Gait Ambulation/Gait assistance: Min assist Gait Distance (Feet): 45 Feet Assistive device: Rolling walker (2 wheels) Gait Pattern/deviations: Decreased step length - right, Decreased step length - left, Decreased stride length Gait velocity: decreased     General Gait Details: slow labored movement with increased time for making turns, no loss of balance, limited mostly due to fatigue  Stairs            Wheelchair Mobility     Tilt Bed    Modified Rankin (Stroke Patients  Only)       Balance Overall balance assessment: Needs assistance Sitting-balance support: No upper extremity supported, Feet supported Sitting balance-Leahy Scale: Fair Sitting balance - Comments: seated at EOB   Standing balance  support: Bilateral upper extremity supported, During functional activity Standing balance-Leahy Scale: Poor Standing balance comment: Fair with RW; cuing needed to manage RW                             Pertinent Vitals/Pain Pain Assessment Pain Assessment: Faces Faces Pain Scale: Hurts a little bit Pain Location: chest Pain Descriptors / Indicators: Discomfort Pain Intervention(s): Monitored during session    Home Living Family/patient expects to be discharged to:: Private residence Living Arrangements: Other relatives Available Help at Discharge: Family;Available 24 hours/day Type of Home: House Home Access: Stairs to enter Entrance Stairs-Rails: Right Entrance Stairs-Number of Steps: 1   Home Layout: One level Home Equipment: Wheelchair - Nurse, Children's (2 wheels);Other (comment);Grab bars - toilet;Grab bars - tub/shower;Shower seat - built in Additional Comments: Lives with sister.    Prior Function Prior Level of Function : Needs assist;History of Falls (last six months)       Physical Assist : ADLs (physical);Mobility (physical) Mobility (physical): Bed mobility;Transfers;Gait;Stairs ADLs (physical): IADLs Mobility Comments: Household ambulation with RW sometimes. Pt also goes without the RW and leans on objects. ADLs Comments: Typically independent with ADL's; pt can do dishes. Some IADL assist.     Extremity/Trunk Assessment   Upper Extremity Assessment Upper Extremity Assessment: Defer to OT evaluation RUE Deficits / Details: Generally weak; 3+/5 gross grasp. Difficult to fully assess due to cognitive deficits. LUE Deficits / Details: L clavicle fracture and surgery recently. 2+/5 MMT shoulder; generally weak.    Lower Extremity Assessment Lower Extremity Assessment: Generalized weakness    Cervical / Trunk Assessment Cervical / Trunk Assessment: Normal  Communication   Communication Communication: Impaired Factors Affecting  Communication: Difficulty expressing self    Cognition Arousal: Alert Behavior During Therapy: WFL for tasks assessed/performed                             Following commands: Impaired Following commands impaired: Follows one step commands inconsistently, Follows one step commands with increased time     Cueing Cueing Techniques: Verbal cues, Tactile cues, Gestural cues     General Comments      Exercises     Assessment/Plan    PT Assessment Patient needs continued PT services  PT Problem List Decreased strength;Decreased activity tolerance;Decreased balance;Decreased mobility       PT Treatment Interventions DME instruction;Gait training;Stair training;Functional mobility training;Therapeutic activities;Therapeutic exercise;Balance training;Patient/family education    PT Goals (Current goals can be found in the Care Plan section)  Acute Rehab PT Goals Patient Stated Goal: return home with family to assist PT Goal Formulation: With patient Time For Goal Achievement: 06/21/24 Potential to Achieve Goals: Good    Frequency Min 3X/week     Co-evaluation PT/OT/SLP Co-Evaluation/Treatment: Yes Reason for Co-Treatment: To address functional/ADL transfers PT goals addressed during session: Mobility/safety with mobility;Balance;Proper use of DME OT goals addressed during session: ADL's and self-care       AM-PAC PT 6 Clicks Mobility  Outcome Measure Help needed turning from your back to your side while in a flat bed without using bedrails?: None Help needed moving from lying on your back to sitting on the side of a flat bed  without using bedrails?: A Little Help needed moving to and from a bed to a chair (including a wheelchair)?: A Little Help needed standing up from a chair using your arms (e.g., wheelchair or bedside chair)?: A Little Help needed to walk in hospital room?: A Lot Help needed climbing 3-5 steps with a railing? : A Lot 6 Click Score: 17     End of Session Equipment Utilized During Treatment: Gait belt Activity Tolerance: Patient tolerated treatment well;Patient limited by fatigue Patient left: in chair;with call bell/phone within reach;with chair alarm set;with family/visitor present Nurse Communication: Mobility status PT Visit Diagnosis: Unsteadiness on feet (R26.81);Other abnormalities of gait and mobility (R26.89);Muscle weakness (generalized) (M62.81)    Time: 9078-9051 PT Time Calculation (min) (ACUTE ONLY): 27 min   Charges:   PT Evaluation $PT Eval Moderate Complexity: 1 Mod PT Treatments $Therapeutic Activity: 23-37 mins PT General Charges $$ ACUTE PT VISIT: 1 Visit         2:39 PM, 06/17/24 Lynwood Music, MPT Physical Therapist with [facility] [phone] office [phone] mobile phone

## 2024-06-17 NOTE — Progress Notes (Signed)
 " PROGRESS NOTE    Maria [name]  FMW:[id number] DOB: Apr 06, 1961 DOA: 06/16/2024 PCP: [name], MD    Brief Narrative:  Maria [name] is a 64 y.o. female with medical history significant for seizure disorder generalized tonic-clonic seizures, multiple recent falls, ambulatory dysfunction uses a walker at baseline, chronic anxiety, who presents with complaints of increasing frequency of breakthrough seizures.  The patient had good seizure control up until May 25, 2024 when she had a fall, which she attributed to dizziness.  She had a series of fall events after that.  She was weaned off her home Vimpat  and started on Keppra .  Patient's hospital stay has been complicated by continued seizures   Assessment and Plan: Increasing frequency of seizure activity, POA Noncontrast head CT, without acute findings. Home dose of Keppra  increased to 1000 mg twice daily Resume home Depakote  Seizure precautions Neurochecks every 4 hours IV Ativan  as needed for breakthrough seizures. -After admission patient continued to have seizures, requiring IV Ativan .  Dr. Shona discussed with on-call neurology who suggested transfer to [facility] for LTM EEG patient will also be resumed on Vimpat  as seizures got worse after this was stopped -Transfers order placed and checkout given to Dr. Drusilla   Frequent multiple falls, POA Was thought to be secondary to Vimpat  toxicity which can cause dizziness Has been off Vimpat  for about a week PT/OT   AKI, prerenal secondary to dehydration from poor oral intake -Resolved with IV fluids   Acute urinary retention, POA Could not void in the ER Foley catheter inserted 06/17/2024.   Presumed UTI, POA UA positive for pyuria Follow urine culture for ID and sensitivities Continue Rocephin  empirically De-escalate antibiotics when able.   Hyperlipidemia Resume home Lipitor   Recent surgery, left open reduction internal fixation of clavicle fracture with  coracoclavicular ligament repair, POA Pain control as needed Prior to admission, on aspirin  81 mg twice daily for DVT prophylaxis per orthopedic surgery Was due to see Dr. Beverley (who did her surgery) tomorrow, if patient remains in the hospital for extended period time may have him come see her    DVT prophylaxis: SCDs Start: 06/17/24 0144    Code Status: Full Code Family Communication: Sister at bedside  Disposition Plan:  Level of care: Progressive Status is: Inpatient     Consultants:  neurology   Subjective: Several seizures this afternoon but was at her baseline when I saw this a.m.  Objective: Vitals:   06/17/24 0200 06/17/24 0339 06/17/24 0731 06/17/24 1223  BP: 128/65 (!) 121/55 135/69 117/67  Pulse: 76 68 75 85  Resp: 13 16 14 18   Temp:  98.1 F (36.7 C) 98.2 F (36.8 C) 98.2 F (36.8 C)  TempSrc:  Oral Oral Oral  SpO2: 96% 100% 98% 99%  Weight:  64.4 kg    Height:  5' 5 (1.651 m)      Intake/Output Summary (Last 24 hours) at 06/17/2024 1342 Last data filed at 06/17/2024 9076 Gross per 24 hour  Intake --  Output 1000 ml  Net -1000 ml   Filed Weights   06/16/24 2019 06/17/24 0339  Weight: 67.1 kg 64.4 kg    Examination:   General: Appearance:    Well developed, well nourished female in no acute distress     Lungs:      respirations unlabored  Heart:    Normal heart rate. Normal rhythm. No murmurs, rubs, or gallops.    MS:   All extremities are intact.  Neurologic:   Awake, alert       Data Reviewed: I have personally reviewed following labs and imaging studies  CBC: Recent Labs  Lab 06/16/24 2045 06/17/24 0355  WBC 9.1 7.4  NEUTROABS 6.6  --   HGB 12.0 10.9*  HCT 37.1 34.5*  MCV 93.0 95.8  PLT 192 186   Basic Metabolic Panel: Recent Labs  Lab 06/16/24 2045 06/17/24 0355  NA 142 142  K 4.1 4.2  CL 105 105  CO2 22 26  GLUCOSE 124* 96  BUN 22 20  CREATININE 1.08* 0.86  CALCIUM  9.9 8.7*  MG 1.9 2.0  PHOS  --  3.1    GFR: Estimated Creatinine Clearance: 61 mL/min (by C-G formula based on SCr of 0.86 mg/dL). Liver Function Tests: Recent Labs  Lab 06/16/24 2045  AST 22  ALT 15  ALKPHOS 113  BILITOT 0.4  PROT 6.8  ALBUMIN  4.2   No results for input(s): LIPASE, AMYLASE in the last 168 hours. No results for input(s): AMMONIA in the last 168 hours. Coagulation Profile: No results for input(s): INR, PROTIME in the last 168 hours. Cardiac Enzymes: No results for input(s): CKTOTAL, CKMB, CKMBINDEX, TROPONINI in the last 168 hours. BNP (last 3 results) No results for input(s): PROBNP in the last 8760 hours. HbA1C: No results for input(s): HGBA1C in the last 72 hours. CBG: No results for input(s): GLUCAP in the last 168 hours. Lipid Profile: No results for input(s): CHOL, HDL, LDLCALC, TRIG, CHOLHDL, LDLDIRECT in the last 72 hours. Thyroid  Function Tests: No results for input(s): TSH, T4TOTAL, FREET4, T3FREE, THYROIDAB in the last 72 hours. Anemia Panel: No results for input(s): VITAMINB12, FOLATE, FERRITIN, TIBC, IRON, RETICCTPCT in the last 72 hours. Sepsis Labs: No results for input(s): PROCALCITON, LATICACIDVEN in the last 168 hours.  Recent Results (from the past 240 hours)  Resp panel by RT-PCR (RSV, Flu A&B, Covid) Anterior Nasal Swab     Status: None   Collection Time: 06/16/24 10:25 PM   Specimen: Anterior Nasal Swab  Result Value Ref Range Status   SARS Coronavirus 2 by RT PCR NEGATIVE NEGATIVE Final    Comment: (NOTE) SARS-CoV-2 target nucleic acids are NOT DETECTED.  The SARS-CoV-2 RNA is generally detectable in upper respiratory specimens during the acute phase of infection. The lowest concentration of SARS-CoV-2 viral copies this assay can detect is 138 copies/mL. A negative result does not preclude SARS-Cov-2 infection and should not be used as the sole basis for treatment or other patient management decisions. A  negative result may occur with  improper specimen collection/handling, submission of specimen other than nasopharyngeal swab, presence of viral mutation(s) within the areas targeted by this assay, and inadequate number of viral copies(<138 copies/mL). A negative result must be combined with clinical observations, patient history, and epidemiological information. The expected result is Negative.  Fact Sheet for Patients:  bloggercourse.com  Fact Sheet for Healthcare Providers:  seriousbroker.it  This test is no t yet approved or cleared by the [place]  FDA and  has been authorized for detection and/or diagnosis of SARS-CoV-2 by FDA under an Emergency Use Authorization (EUA). This EUA will remain  in effect (meaning this test can be used) for the duration of the COVID-19 declaration under Section 564(b)(1) of the Act, 21 U.S.C.section 360bbb-3(b)(1), unless the authorization is terminated  or revoked sooner.       Influenza A by PCR NEGATIVE NEGATIVE Final   Influenza B by PCR NEGATIVE NEGATIVE Final    Comment: (  NOTE) The Xpert Xpress SARS-CoV-2/FLU/RSV plus assay is intended as an aid in the diagnosis of influenza from Nasopharyngeal swab specimens and should not be used as a sole basis for treatment. Nasal washings and aspirates are unacceptable for Xpert Xpress SARS-CoV-2/FLU/RSV testing.  Fact Sheet for Patients: bloggercourse.com  Fact Sheet for Healthcare Providers: seriousbroker.it  This test is not yet approved or cleared by the [place]  FDA and has been authorized for detection and/or diagnosis of SARS-CoV-2 by FDA under an Emergency Use Authorization (EUA). This EUA will remain in effect (meaning this test can be used) for the duration of the COVID-19 declaration under Section 564(b)(1) of the Act, 21 U.S.C. section 360bbb-3(b)(1), unless the authorization  is terminated or revoked.     Resp Syncytial Virus by PCR NEGATIVE NEGATIVE Final    Comment: (NOTE) Fact Sheet for Patients: bloggercourse.com  Fact Sheet for Healthcare Providers: seriousbroker.it  This test is not yet approved or cleared by the [place]  FDA and has been authorized for detection and/or diagnosis of SARS-CoV-2 by FDA under an Emergency Use Authorization (EUA). This EUA will remain in effect (meaning this test can be used) for the duration of the COVID-19 declaration under Section 564(b)(1) of the Act, 21 U.S.C. section 360bbb-3(b)(1), unless the authorization is terminated or revoked.  Performed at [facility], [address] [place], [place] [address]          Radiology Studies: DG Chest Portable 1 View Result Date: 06/16/2024 EXAM: 1 VIEW(S) XRAY OF THE CHEST 06/16/2024 09:45:00 PM COMPARISON: 05/30/2024 CLINICAL HISTORY: weakness weakness weakness weakness FINDINGS: LUNGS AND PLEURA: No focal pulmonary opacity. No pleural effusion. No pneumothorax. HEART AND MEDIASTINUM: Aortic arch calcifications. No acute abnormality of the cardiac and mediastinal silhouettes. BONES AND SOFT TISSUES: Right shoulder prosthesis noted. ORIF distal left clavicle noted. No acute osseous abnormality. IMPRESSION: 1. No acute cardiopulmonary abnormality. Electronically signed by: Morgane Naveau MD 06/16/2024 09:50 PM EST RP Workstation: [id number]   CT Head Wo Contrast Result Date: 06/16/2024 EXAM: CT HEAD WITHOUT CONTRAST 06/16/2024 09:21:18 PM TECHNIQUE: CT of the head was performed without the administration of intravenous contrast. Automated exposure control, iterative reconstruction, and/or weight based adjustment of the mA/kV was utilized to reduce the radiation dose to as low as reasonably achievable. COMPARISON: CT head 05/31/2024. CLINICAL HISTORY: Head trauma, coagulopathy (Age 77-64y); Headache, new onset (Age >= 51y).  FINDINGS: BRAIN AND VENTRICLES: Right inferior frontal encephalomalacia. No acute hemorrhage. No evidence of acute infarct. No hydrocephalus. No extra-axial collection. No mass effect or midline shift. ORBITS: Bilateral lens replacement. SINUSES: No acute abnormality. SOFT TISSUES AND SKULL: Right periorbital soft tissue hematoma. No skull fracture. LUNGS: Bilateral lung replacement. IMPRESSION: 1. No acute intracranial abnormality. 2. Right periorbital soft tissue hematoma. Electronically signed by: Morgane Naveau MD 06/16/2024 09:29 PM EST RP Workstation: [id number]        Scheduled Meds:  aspirin  EC  81 mg Oral BID   Chlorhexidine  Gluconate Cloth  6 each Topical Q0600   clonazePAM   0.5 mg Oral BID   [START ON 06/18/2024] influenza vac split trivalent PF  0.5 mL Intramuscular Tomorrow-1000   levETIRAcetam   1,000 mg Intravenous BID   Continuous Infusions:  sodium chloride  75 mL/hr at 06/17/24 0317   [START ON 06/18/2024] cefTRIAXone  (ROCEPHIN )  IV     valproate sodium  500 mg (06/17/24 0613)     LOS: 0 days     CRITICAL CARE Performed by: Harlene RAYMOND Bowl   Total critical care time: 45 minutes  Critical care time was exclusive of separately billable procedures and treating other patients.  Critical care was necessary to treat or prevent imminent or life-threatening deterioration.  Critical care was time spent personally by me on the following activities: development of treatment plan with patient and/or surrogate as well as nursing, discussions with consultants, evaluation of patient's response to treatment, examination of patient, obtaining history from patient or surrogate, ordering and performing treatments and interventions, ordering and review of laboratory studies, ordering and review of radiographic studies, pulse oximetry and re-evaluation of patient's condition.   Harlene RAYMOND Bowl, DO [facility] Available via Epic secure chat 7am-7pm After these hours, please refer  to coverage provider listed on [email or web address] 06/17/2024, 1:42 PM   "

## 2024-06-18 ENCOUNTER — Inpatient Hospital Stay (HOSPITAL_COMMUNITY)

## 2024-06-18 DIAGNOSIS — R569 Unspecified convulsions: Secondary | ICD-10-CM | POA: Diagnosis not present

## 2024-06-18 DIAGNOSIS — G40909 Epilepsy, unspecified, not intractable, without status epilepticus: Secondary | ICD-10-CM | POA: Diagnosis not present

## 2024-06-18 DIAGNOSIS — N390 Urinary tract infection, site not specified: Secondary | ICD-10-CM | POA: Diagnosis not present

## 2024-06-18 LAB — CBC WITH DIFFERENTIAL/PLATELET
Abs Immature Granulocytes: 0.03 K/uL (ref 0.00–0.07)
Basophils Absolute: 0 K/uL (ref 0.0–0.1)
Basophils Relative: 0 %
Eosinophils Absolute: 0 K/uL (ref 0.0–0.5)
Eosinophils Relative: 1 %
HCT: 33 % — ABNORMAL LOW (ref 36.0–46.0)
Hemoglobin: 11 g/dL — ABNORMAL LOW (ref 12.0–15.0)
Immature Granulocytes: 0 %
Lymphocytes Relative: 19 %
Lymphs Abs: 1.6 K/uL (ref 0.7–4.0)
MCH: 30.8 pg (ref 26.0–34.0)
MCHC: 33.3 g/dL (ref 30.0–36.0)
MCV: 92.4 fL (ref 80.0–100.0)
Monocytes Absolute: 0.8 K/uL (ref 0.1–1.0)
Monocytes Relative: 10 %
Neutro Abs: 5.9 K/uL (ref 1.7–7.7)
Neutrophils Relative %: 70 %
Platelets: 164 K/uL (ref 150–400)
RBC: 3.57 MIL/uL — ABNORMAL LOW (ref 3.87–5.11)
RDW: 14 % (ref 11.5–15.5)
WBC: 8.4 K/uL (ref 4.0–10.5)
nRBC: 0 % (ref 0.0–0.2)

## 2024-06-18 LAB — URINE CULTURE: Culture: NO GROWTH

## 2024-06-18 MED ORDER — TAMSULOSIN HCL 0.4 MG PO CAPS
0.4000 mg | ORAL_CAPSULE | Freq: Every day | ORAL | Status: DC
  Administered 2024-06-18 (×2): 0.4 mg via ORAL
  Filled 2024-06-18 (×2): qty 1

## 2024-06-18 MED ORDER — HALOPERIDOL LACTATE 5 MG/ML IJ SOLN
5.0000 mg | Freq: Three times a day (TID) | INTRAMUSCULAR | Status: DC | PRN
  Administered 2024-06-18 – 2024-06-19 (×2): 5 mg via INTRAVENOUS
  Filled 2024-06-18 (×2): qty 1

## 2024-06-18 NOTE — TOC Progression Note (Signed)
 Transition of Care [facility]) - Progression Note    Patient Details  Name: Maria [name] MRN: [id number] Date of Birth: 03/12/1961  Transition of Care [facility]) CM/SW Contact  Andrez JULIANNA George, RN Phone Number: 06/18/2024, 2:21 PM  Clinical Narrative:     Pt with recent shoulder surgery after a fall. Sister is her primary caregiver. She manages the patients medications and provides needed transportation.  [facility] arranged prior to transfer to [facility].   IP care management following.  Expected Discharge Plan: Home w Home Health Services Barriers to Discharge: Continued Medical Work up               Expected Discharge Plan and Services In-house Referral: Clinical Social Work   Post Acute Care Choice: Durable Medical Equipment Living arrangements for the past 2 months: Single Family Home                           HH Arranged: PT HH Agency: [facility] ([facility]) Date HH Agency Contacted: 06/17/24 Time HH Agency Contacted: 1147 Representative spoke with at [facility] Agency: [facility]   Social Drivers of Health (SDOH) Interventions SDOH Screenings   Food Insecurity: No Food Insecurity (06/17/2024)  Housing: Low Risk (06/17/2024)  Transportation Needs: No Transportation Needs (06/17/2024)  Utilities: Not At Risk (06/17/2024)  Depression (PHQ2-9): Low Risk (12/02/2023)  Tobacco Use: Medium Risk (06/17/2024)    Readmission Risk Interventions    06/17/2024   11:37 AM 11/24/2023    9:51 AM  Readmission Risk Prevention Plan  Medication Screening Complete   Transportation Screening Complete Complete  Home Care Screening  Complete  Medication Review (RN CM)  Complete

## 2024-06-18 NOTE — Progress Notes (Signed)
 LTM EEG hooked up and recording with CT compatible leads. Patient is being monitored by [facility].

## 2024-06-18 NOTE — Progress Notes (Addendum)
 Physical Therapy Treatment Patient Details Name: Maria [name] MRN: [id number] DOB: 10/24/1960 Today's Date: 06/18/2024   History of Present Illness Maria [name] is a 64 y.o. female with medical history significant for seizure disorder generalized tonic-clonic seizures, multiple recent falls, ambulatory dysfunction uses a walker at baseline, chronic anxiety, who presents with complaints of increasing frequency of breakthrough seizures.  The patient had good seizure control up until May 25, 2024 when she had a fall, which she attributed to dizziness.  She had a series of fall events after that.  She was weaned off her home Vimpat  and started on Keppra .  Vimpat  was eventually discontinued about 1 week ago.       There were concerns about her falls being a side effect of Vimpat  toxicity.  The patient had multiple falls including a fall that led to left clavicle fracture on 05/25/2024, and another fall on 05/30/2024 that led to right periorbital contusion.  In the past 2 days, she has had more seizures, multiple breakthrough seizures, 4 to 5 today, prior to presenting to the ER.  The patient lives at home with her sister.    PT Comments  Pt with fair tolerance to treatment today. Pt on long term EEG therefore unable to ambulate however pt was able to perform therex at EOB. Upon returning to bed, pt noted with 3 separate seizure like activities where pt would have a blank stare for a few seconds with hand tremors along with brief yet significant increase in HR. Highest HR noted was 147 and RN was called into room. Each seizure like activity lasted for approximately 10 seconds with about 2 minutes in between episodes. Upon completion of episodes, pt noted to shout out and become very restless. Pt sister present and stating this is not normal for pt at all. RN made aware. No change in DC/DME recs at this time. PT will continue to follow.        If plan is discharge home, recommend the following: A  little help with walking and/or transfers;A little help with bathing/dressing/bathroom;Help with stairs or ramp for entrance;Assist for transportation;Assistance with cooking/housework   Can travel by private vehicle        Equipment Recommendations  None recommended by PT    Recommendations for Other Services       Precautions / Restrictions Precautions Precautions: Fall Recall of Precautions/Restrictions: Impaired Precaution/Restrictions Comments: Recent L clavical fracture. Pt should be wearing sling but cannot tolerate due to many recent seizures. Restrictions Weight Bearing Restrictions Per Provider Order: No     Mobility  Bed Mobility Overal bed mobility: Needs Assistance Bed Mobility: Supine to Sit, Sit to Supine     Supine to sit: Min assist, HOB elevated Sit to supine: Min assist, HOB elevated   General bed mobility comments: increased time, labored movement. Assistance with lines and leads.    Transfers Overall transfer level: Needs assistance Equipment used: None, 1 person hand held assist Transfers: Sit to/from Stand Sit to Stand: Min assist           General transfer comment: Able to complete a total of 10 sit to stands with Min A HHA. Poor command following and required step by step cues each time. Unable to ambulate due to EEG running.    Ambulation/Gait               General Gait Details: limited by EEG running.   Stairs  Wheelchair Mobility     Tilt Bed    Modified Rankin (Stroke Patients Only)       Balance Overall balance assessment: Needs assistance Sitting-balance support: No upper extremity supported, Feet supported Sitting balance-Leahy Scale: Fair Sitting balance - Comments: seated at EOB   Standing balance support: Bilateral upper extremity supported, During functional activity Standing balance-Leahy Scale: Poor Standing balance comment: Reliant on external support.                             Communication Communication Communication: Impaired Factors Affecting Communication: Difficulty expressing self  Cognition Arousal: Alert Behavior During Therapy: Restless, Impulsive   PT - Cognitive impairments: Awareness, Orientation, Attention, Initiation, Sequencing, Problem solving, Safety/Judgement   Orientation impairments: Situation, Time, Place                   PT - Cognition Comments: Pt very restless today constantly pulling at lines and readjusting in bed. Disoriented to place and situation telling her sister that we need to go to the hospital. Poor command following Following commands: Impaired Following commands impaired: Follows one step commands inconsistently, Follows one step commands with increased time    Cueing Cueing Techniques: Verbal cues, Tactile cues, Gestural cues  Exercises General Exercises - Lower Extremity Ankle Circles/Pumps: AROM, Both, 10 reps Long Arc Quad: AROM, Both, Seated, 20 reps Hip Flexion/Marching: AROM, Both, 10 reps, Seated Heel Raises: AROM, Both, 10 reps    General Comments General comments (skin integrity, edema, etc.): HR up to 147 with 3 seizure like activity. RN called into room and EEG button pressed.      Pertinent Vitals/Pain Pain Assessment Pain Assessment: No/denies pain    Home Living                          Prior Function            PT Goals (current goals can now be found in the care plan section) Progress towards PT goals: Progressing toward goals    Frequency    Min 2X/week      PT Plan      Co-evaluation              AM-PAC PT 6 Clicks Mobility   Outcome Measure  Help needed turning from your back to your side while in a flat bed without using bedrails?: None Help needed moving from lying on your back to sitting on the side of a flat bed without using bedrails?: A Little Help needed moving to and from a bed to a chair (including a wheelchair)?: A Little Help  needed standing up from a chair using your arms (e.g., wheelchair or bedside chair)?: A Little Help needed to walk in hospital room?: A Lot Help needed climbing 3-5 steps with a railing? : A Lot 6 Click Score: 17    End of Session Equipment Utilized During Treatment: Gait belt Activity Tolerance: Patient tolerated treatment well;Patient limited by fatigue Patient left: in bed;with call bell/phone within reach;with bed alarm set;with family/visitor present Nurse Communication: Mobility status PT Visit Diagnosis: Unsteadiness on feet (R26.81);Other abnormalities of gait and mobility (R26.89);Muscle weakness (generalized) (M62.81)     Time: 8597-8568 PT Time Calculation (min) (ACUTE ONLY): 29 min  Charges:    $Therapeutic Exercise: 8-22 mins $Therapeutic Activity: 8-22 mins PT General Charges $$ ACUTE PT VISIT: 1 Visit  Krystine Pabst B, PT, DPT Acute Rehab Services [phone]    Oliviana Mcgahee 06/18/2024, 4:40 PM

## 2024-06-18 NOTE — Progress Notes (Signed)
 MB received call from [facility] stating they were no longer able to see Pt. EEG monitor. I arrived to troubleshoot and determine Pt not only pulled off a few leads, she somehow disconnected the Head box from cable. I had to re-attach electrodes, shut down and restart unit and asked nurse and Dr. If Pt had orders for soft restraints, the nurse applied them. I also discovered that the Event Push button was somehow pulled out of input, broken off, and not working per the Pt. Sister. I return to the lab, tested a few push buttons and returned back to room to install it and test it. I notified [facility] and verified they could now see LTM EEG.

## 2024-06-18 NOTE — Plan of Care (Signed)

## 2024-06-18 NOTE — Procedures (Signed)
 Patient Name: Maria [name]  MRN: [id number]  Epilepsy Attending: Arlin MALVA Krebs  Referring Physician/Provider: Vann, Jessica U, DO  Duration: 06/17/2024 2324 to 06/18/2024 2324  Patient history: 64 year old female with history of epilepsy now with breakthrough seizures. EEG to evaluate for seizure   Level of alertness: Awake, asleep  AEDs during EEG study: LCM, VPA, Clonazepam   Technical aspects: This EEG study was done with scalp electrodes positioned according to the 10-20 International system of electrode placement. Electrical activity was reviewed with band pass filter of 1-70Hz , sensitivity of 7 uV/mm, display speed of 69mm/sec with a 60Hz  notched filter applied as appropriate. EEG data were recorded continuously and digitally stored.  Video monitoring was available and reviewed as appropriate.  Description: The posterior dominant rhythm consists of 9 Hz activity of moderate voltage (25-35 uV) seen predominantly in posterior head regions, symmetric and reactive to eye opening and eye closing. Sleep was characterized by vertex waves, sleep spindles (12 to 14 Hz), maximal frontocentral region. There is intermittent 3 to 6 Hz theta-delta slowing admixed with 13-15hz  beta activity. Hyperventilation and photic stimulation were not performed.     ABNORMALITY - Intermittent slow, generalized  IMPRESSION: This study is suggestive of generalized cerebral dysfunction (encephalopathy). No seizures or epileptiform discharges were seen throughout the recording.  Maria [name]

## 2024-06-18 NOTE — Consult Note (Signed)
 WOC team consulted for L arm surgical wound  Secure chat to primary team requesting photo be uploaded to chart for review.   Please note that the [facility] nursing team is utilizing a standardized work plan to manage patient consults. We are triaging consults and will try to see the patients within 24 hours. Wound photos in the patient's chart allow us  to consult on the patient in the most efficient and timely manner.    Thank you,    Powell Bar MSN, RN-BC, [employer]

## 2024-06-18 NOTE — Progress Notes (Addendum)
 Subjective: No seizures overnight.  Per sister at bedside she did have some episodes where she was briefly staring and then hollering for her mom.  However these were not typical of her usual seizures.  Additionally per sister at bedside patient appears to be more restless.  Patient states she is uncomfortable because of her left shoulder pain since recent surgery  ROS: negative except above  Examination  Vital signs in last 24 hours: Temp:  [97.6 F (36.4 C)-99.7 F (37.6 C)] 97.6 F (36.4 C) (01/16 0824) Pulse Rate:  [70-88] 82 (01/16 0824) Resp:  [18] 18 (01/16 0824) BP: (106-151)/(61-100) 151/61 (01/16 0824) SpO2:  [97 %-100 %] 100 % (01/16 0824)  General: lying in bed, NAD Neuro: MS: Alert, oriented, follows commands CN: pupils equal and reactive,  EOMI, face symmetric, tongue midline, normal sensation over face, Motor: 5/5 strength in all 4 extremities  Coordination: normal Gait: not tested  Basic Metabolic Panel: Recent Labs  Lab 06/16/24 2045 06/17/24 0355  NA 142 142  K 4.1 4.2  CL 105 105  CO2 22 26  GLUCOSE 124* 96  BUN 22 20  CREATININE 1.08* 0.86  CALCIUM  9.9 8.7*  MG 1.9 2.0  PHOS  --  3.1    CBC: Recent Labs  Lab 06/16/24 2045 06/17/24 0355  WBC 9.1 7.4  NEUTROABS 6.6  --   HGB 12.0 10.9*  HCT 37.1 34.5*  MCV 93.0 95.8  PLT 192 186     Coagulation Studies: No results for input(s): LABPROT, INR in the last 72 hours.  Imaging No new brain imaging overnight  ASSESSMENT AND PLAN: 64 year old female with history of epilepsy now with increased to continue breakthrough seizures.   Epilepsy with breakthrough seizure -Increased recurrence of seizure most likely in the setting of UTI - Continue Vimpat  100 mg twice daily, Klonopin  to 0.5 mg 3 times daily and Depakote  500mg  BID - Continue EEG overnight.  I discussed with sister to press the event button if she sees any of those episodes of staring today. -Patient was not confused or irritable  on my exam.  However I did discuss with sister that some of this could be because of postictal state, lingering effects of Keppra  which can worsen irritability.  Will continue to monitor -as needed IV Versed  or Ativan  2 mg for clinical seizures lasting more than 2 minutes -Discussed plan with patient sister at bedside -Discussed plan with hospitalist via secure chat    Thank you for allowing us  to participate in the care of this patient. If you have any further questions, please contact  me or neurohospitalist.     I personally spent a total of 37 minutes in the care of the patient today including getting/reviewing separately obtained history, performing a medically appropriate exam/evaluation, counseling and educating, placing orders, referring and communicating with other health care professionals, documenting clinical information in the EHR, independently interpreting results, and coordinating care.           Arlin Krebs Epilepsy [facility] For questions after 5pm please refer to AMION to reach the Neurologist on call

## 2024-06-18 NOTE — Progress Notes (Signed)
 " PROGRESS NOTE    Maria [name]  FMW:[id number] DOB: Jun 26, 1960 DOA: 06/16/2024 PCP: [name], MD   Brief Narrative:  Maria [name] is a 64 y.o. female with medical history significant for seizure disorder generalized tonic-clonic seizures, multiple recent falls, ambulatory dysfunction uses a walker at baseline, chronic anxiety, who presents with complaints of increasing frequency of breakthrough seizures.  The patient had good seizure control up until May 25, 2024 when she had a fall, which she attributed to dizziness.  She had a series of fall events after that.  She was weaned off her home Vimpat  and started on Keppra .  Patient's hospital stay has been complicated by continued seizures    Assessment & Plan:   Principal Problem:   Increasing frequency of seizure activity (HCC)  Acute metabolic encephalopathy secondary to increasing frequency of seizure activity/breakthrough seizures, POA Noncontrast head CT, without acute findings. After admission patient continued to have seizures, requiring IV Ativan .  Per neurology recommendation, patient transferred to [facility] for LTM EEG patient will also be resumed on Vimpat  as seizures got worse after this was stopped.  Patient is fully alert and mostly oriented.  Sister at the bedside very emotional and crying and saying  my sister is not right, she is still having seizures, she is not making sense when she talks and that is not her  management per neurology.   Frequent multiple falls, POA Was thought to be secondary to Vimpat  toxicity which can cause dizziness Has been off Vimpat  for about a week but now has been resumed.  Seen by PT OT, home health recommended.   AKI, prerenal secondary to dehydration from poor oral intake -Resolved with IV fluids    Acute urinary retention, POA Could not void in the ER, Foley placed in ED on 06/17/2024.  Will start on Flomax , consider removing Foley catheter tomorrow morning.   Presumed UTI,  POA UA positive for pyuria however no urinary symptoms endorsed by patient, no leukocytosis and no fever.  Urine culture is negative as well.  UA does not have nitrites, not impressive enough to call UTI.  This was likely asymptomatic bacteriuria.  I will discontinue antibiotics.   Hyperlipidemia Continue Lipitor   Recent surgery, left open reduction internal fixation of clavicle fracture with coracoclavicular ligament repair, POA Pain control as needed Prior to admission, on aspirin  81 mg twice daily for DVT prophylaxis per orthopedic surgery Was due to see Dr. Beverley (who did her surgery) tomorrow, if patient remains in the hospital for extended period time may have him come see her  DVT prophylaxis: SCDs Start: 06/17/24 0144   Code Status: Full Code  Family Communication: Sister present at bedside.  Plan of care discussed with patient in length and he/she verbalized understanding and agreed with it.  Status is: Inpatient Remains inpatient appropriate because: Likely still having seizures.   Estimated body mass index is 23.63 kg/m as calculated from the following:   Height as of this encounter: 5' 5 (1.651 m).   Weight as of this encounter: 64.4 kg.    Nutritional Assessment: Body mass index is 23.63 kg/m.SABRA Seen by dietician.  I agree with the assessment and plan as outlined below: Nutrition Status:        . Skin Assessment: I have examined the patient's skin and I agree with the wound assessment as performed by the wound care RN as outlined below:    Consultants:  Neurology  Procedures:  As above  Antimicrobials:  Anti-infectives (From admission, onward)    Start     Dose/Rate Route Frequency Ordered Stop   06/18/24 0100  cefTRIAXone  (ROCEPHIN ) 2 g in sodium chloride  0.9 % 100 mL IVPB        2 g 200 mL/hr over 30 Minutes Intravenous Every 24 hours 06/17/24 0250     06/17/24 0145  cefTRIAXone  (ROCEPHIN ) 2 g in sodium chloride  0.9 % 100 mL IVPB        2 g 200  mL/hr over 30 Minutes Intravenous  Once 06/17/24 0130 06/17/24 0214         Subjective: Patient seen and examined, sister at the bedside.  When I entered the room, sister was already very emotional and crying.  I asked her what the reason was.  She says  my sister is not doing well, she is confused, she is not making sense when she talks, this is not like her, I think she still having seizures.  I reassured her that patient is being monitored with LTM EEG and the neurologist is on board and will make sure we try every management of their to control her seizures. When I asked patient how she was feeling.  She says  I am not doing well but then she could not elaborate her symptoms more.  Objective: Vitals:   06/17/24 1936 06/17/24 2315 06/18/24 0404 06/18/24 0824  BP: (!) 119/100  106/65 (!) 151/61  Pulse: 88 70 86 82  Resp: 18  18 18   Temp: 99.6 F (37.6 C) 99.7 F (37.6 C) 98.6 F (37 C) 97.6 F (36.4 C)  TempSrc: Oral Axillary Oral   SpO2: 97% 98% 98% 100%  Weight:      Height:        Intake/Output Summary (Last 24 hours) at 06/18/2024 0826 Last data filed at 06/18/2024 0404 Gross per 24 hour  Intake 1299.21 ml  Output 2500 ml  Net -1200.79 ml   Filed Weights   06/16/24 2019 06/17/24 0339  Weight: 67.1 kg 64.4 kg    Examination:  General exam: Appears calm and comfortable  Respiratory system: Clear to auscultation. Respiratory effort normal. Cardiovascular system: S1 & S2 heard, RRR. No JVD, murmurs, rubs, gallops or clicks. No pedal edema. Gastrointestinal system: Abdomen is nondistended, soft and nontender. No organomegaly or masses felt. Normal bowel sounds heard. Central nervous system: Alert and oriented x 3. No focal neurological deficits. Extremities: Symmetric 5 x 5 power. Skin: No rashes, lesions or ulcers  Data Reviewed: I have personally reviewed following labs and imaging studies  CBC: Recent Labs  Lab 06/16/24 2045 06/17/24 0355  WBC 9.1 7.4   NEUTROABS 6.6  --   HGB 12.0 10.9*  HCT 37.1 34.5*  MCV 93.0 95.8  PLT 192 186   Basic Metabolic Panel: Recent Labs  Lab 06/16/24 2045 06/17/24 0355  NA 142 142  K 4.1 4.2  CL 105 105  CO2 22 26  GLUCOSE 124* 96  BUN 22 20  CREATININE 1.08* 0.86  CALCIUM  9.9 8.7*  MG 1.9 2.0  PHOS  --  3.1   GFR: Estimated Creatinine Clearance: 61 mL/min (by C-G formula based on SCr of 0.86 mg/dL). Liver Function Tests: Recent Labs  Lab 06/16/24 2045  AST 22  ALT 15  ALKPHOS 113  BILITOT 0.4  PROT 6.8  ALBUMIN  4.2   No results for input(s): LIPASE, AMYLASE in the last 168 hours. No results for input(s): AMMONIA in the last 168 hours. Coagulation Profile: No results  for input(s): INR, PROTIME in the last 168 hours. Cardiac Enzymes: No results for input(s): CKTOTAL, CKMB, CKMBINDEX, TROPONINI in the last 168 hours. BNP (last 3 results) No results for input(s): PROBNP in the last 8760 hours. HbA1C: No results for input(s): HGBA1C in the last 72 hours. CBG: No results for input(s): GLUCAP in the last 168 hours. Lipid Profile: No results for input(s): CHOL, HDL, LDLCALC, TRIG, CHOLHDL, LDLDIRECT in the last 72 hours. Thyroid  Function Tests: No results for input(s): TSH, T4TOTAL, FREET4, T3FREE, THYROIDAB in the last 72 hours. Anemia Panel: No results for input(s): VITAMINB12, FOLATE, FERRITIN, TIBC, IRON, RETICCTPCT in the last 72 hours. Sepsis Labs: No results for input(s): PROCALCITON, LATICACIDVEN in the last 168 hours.  Recent Results (from the past 240 hours)  Resp panel by RT-PCR (RSV, Flu A&B, Covid) Anterior Nasal Swab     Status: None   Collection Time: 06/16/24 10:25 PM   Specimen: Anterior Nasal Swab  Result Value Ref Range Status   SARS Coronavirus 2 by RT PCR NEGATIVE NEGATIVE Final    Comment: (NOTE) SARS-CoV-2 target nucleic acids are NOT DETECTED.  The SARS-CoV-2 RNA is generally detectable in  upper respiratory specimens during the acute phase of infection. The lowest concentration of SARS-CoV-2 viral copies this assay can detect is 138 copies/mL. A negative result does not preclude SARS-Cov-2 infection and should not be used as the sole basis for treatment or other patient management decisions. A negative result may occur with  improper specimen collection/handling, submission of specimen other than nasopharyngeal swab, presence of viral mutation(s) within the areas targeted by this assay, and inadequate number of viral copies(<138 copies/mL). A negative result must be combined with clinical observations, patient history, and epidemiological information. The expected result is Negative.  Fact Sheet for Patients:  bloggercourse.com  Fact Sheet for Healthcare Providers:  seriousbroker.it  This test is no t yet approved or cleared by the [place]  FDA and  has been authorized for detection and/or diagnosis of SARS-CoV-2 by FDA under an Emergency Use Authorization (EUA). This EUA will remain  in effect (meaning this test can be used) for the duration of the COVID-19 declaration under Section 564(b)(1) of the Act, 21 U.S.C.section 360bbb-3(b)(1), unless the authorization is terminated  or revoked sooner.       Influenza A by PCR NEGATIVE NEGATIVE Final   Influenza B by PCR NEGATIVE NEGATIVE Final    Comment: (NOTE) The Xpert Xpress SARS-CoV-2/FLU/RSV plus assay is intended as an aid in the diagnosis of influenza from Nasopharyngeal swab specimens and should not be used as a sole basis for treatment. Nasal washings and aspirates are unacceptable for Xpert Xpress SARS-CoV-2/FLU/RSV testing.  Fact Sheet for Patients: bloggercourse.com  Fact Sheet for Healthcare Providers: seriousbroker.it  This test is not yet approved or cleared by the [place]  FDA and has been  authorized for detection and/or diagnosis of SARS-CoV-2 by FDA under an Emergency Use Authorization (EUA). This EUA will remain in effect (meaning this test can be used) for the duration of the COVID-19 declaration under Section 564(b)(1) of the Act, 21 U.S.C. section 360bbb-3(b)(1), unless the authorization is terminated or revoked.     Resp Syncytial Virus by PCR NEGATIVE NEGATIVE Final    Comment: (NOTE) Fact Sheet for Patients: bloggercourse.com  Fact Sheet for Healthcare Providers: seriousbroker.it  This test is not yet approved or cleared by the [place]  FDA and has been authorized for detection and/or diagnosis of SARS-CoV-2 by FDA under an Emergency Use Authorization (  EUA). This EUA will remain in effect (meaning this test can be used) for the duration of the COVID-19 declaration under Section 564(b)(1) of the Act, 21 U.S.C. section 360bbb-3(b)(1), unless the authorization is terminated or revoked.  Performed at [facility], [address] [place], [place] [address]   Urine Culture     Status: None   Collection Time: 06/16/24 10:53 PM   Specimen: Urine, Random  Result Value Ref Range Status   Specimen Description   Final    URINE, RANDOM Performed at [facility], [address] [place], [place] [address]    Special Requests   Final    NONE Reflexed from [id number] Performed at [facility], [address] [place], [place] [address]    Culture   Final    NO GROWTH Performed at [facility], [address] [place], [place] [address]    Report Status 06/18/2024 FINAL  Final     Radiology Studies: DG Chest Portable 1 View Result Date: 06/16/2024 EXAM: 1 VIEW(S) XRAY OF THE CHEST 06/16/2024 09:45:00 PM COMPARISON: 05/30/2024 CLINICAL HISTORY: weakness weakness weakness weakness FINDINGS: LUNGS AND PLEURA: No focal pulmonary opacity. No pleural effusion. No pneumothorax. HEART AND MEDIASTINUM: Aortic arch  calcifications. No acute abnormality of the cardiac and mediastinal silhouettes. BONES AND SOFT TISSUES: Right shoulder prosthesis noted. ORIF distal left clavicle noted. No acute osseous abnormality. IMPRESSION: 1. No acute cardiopulmonary abnormality. Electronically signed by: Morgane Naveau MD 06/16/2024 09:50 PM EST RP Workstation: [id number]   CT Head Wo Contrast Result Date: 06/16/2024 EXAM: CT HEAD WITHOUT CONTRAST 06/16/2024 09:21:18 PM TECHNIQUE: CT of the head was performed without the administration of intravenous contrast. Automated exposure control, iterative reconstruction, and/or weight based adjustment of the mA/kV was utilized to reduce the radiation dose to as low as reasonably achievable. COMPARISON: CT head 05/31/2024. CLINICAL HISTORY: Head trauma, coagulopathy (Age 46-64y); Headache, new onset (Age >= 51y). FINDINGS: BRAIN AND VENTRICLES: Right inferior frontal encephalomalacia. No acute hemorrhage. No evidence of acute infarct. No hydrocephalus. No extra-axial collection. No mass effect or midline shift. ORBITS: Bilateral lens replacement. SINUSES: No acute abnormality. SOFT TISSUES AND SKULL: Right periorbital soft tissue hematoma. No skull fracture. LUNGS: Bilateral lung replacement. IMPRESSION: 1. No acute intracranial abnormality. 2. Right periorbital soft tissue hematoma. Electronically signed by: Morgane Naveau MD 06/16/2024 09:29 PM EST RP Workstation: [id number]    Scheduled Meds:  aspirin  EC  81 mg Oral BID   Chlorhexidine  Gluconate Cloth  6 each Topical Q0600   clonazePAM   0.5 mg Oral TID   influenza vac split trivalent PF  0.5 mL Intramuscular Tomorrow-1000   lacosamide   100 mg Oral BID   tamsulosin   0.4 mg Oral QPC supper   Continuous Infusions:  cefTRIAXone  (ROCEPHIN )  IV 2 g (06/18/24 0004)   valproate sodium  500 mg (06/17/24 2214)     LOS: 1 day   Fredia Skeeter, MD [facility]  06/18/2024, 8:26 AM   *Please note that this is a verbal dictation  therefore any spelling or grammatical errors are due to the Dragon Medical One system interpretation.  Please page via Amion and do not message via secure chat for urgent patient care matters. Secure chat can be used for non urgent patient care matters.  How to contact the [facility] Attending or Consulting provider 7A - 7P or covering provider during after hours 7P -7A, for this patient?  Check the care team in [facility] and look for a) attending/consulting [facility] provider listed and b) the  [facility] team listed. Page or secure chat 7A-7P. Log into [email or web address] and use [facility]'s universal password to access. If you do not have the password, please contact the hospital operator. Locate the [facility] provider you are looking for under [facility] and page to a number that you can be directly reached. If you still have difficulty reaching the provider, please page the [facility] (Director on Call) for the Hospitalists listed on amion for assistance.  "

## 2024-06-19 ENCOUNTER — Inpatient Hospital Stay (HOSPITAL_COMMUNITY)

## 2024-06-19 ENCOUNTER — Other Ambulatory Visit (HOSPITAL_COMMUNITY): Payer: Self-pay

## 2024-06-19 DIAGNOSIS — R569 Unspecified convulsions: Secondary | ICD-10-CM | POA: Diagnosis not present

## 2024-06-19 DIAGNOSIS — G40909 Epilepsy, unspecified, not intractable, without status epilepticus: Secondary | ICD-10-CM | POA: Diagnosis not present

## 2024-06-19 LAB — BASIC METABOLIC PANEL WITH GFR
Anion gap: 12 (ref 5–15)
BUN: 14 mg/dL (ref 8–23)
CO2: 22 mmol/L (ref 22–32)
Calcium: 9.3 mg/dL (ref 8.9–10.3)
Chloride: 107 mmol/L (ref 98–111)
Creatinine, Ser: 0.74 mg/dL (ref 0.44–1.00)
GFR, Estimated: >60 mL/min
Glucose, Bld: 119 mg/dL — ABNORMAL HIGH (ref 70–99)
Potassium: 4.2 mmol/L (ref 3.5–5.1)
Sodium: 141 mmol/L (ref 135–145)

## 2024-06-19 LAB — CBC WITH DIFFERENTIAL/PLATELET
Abs Immature Granulocytes: 0.03 K/uL (ref 0.00–0.07)
Basophils Absolute: 0 K/uL (ref 0.0–0.1)
Basophils Relative: 1 %
Eosinophils Absolute: 0.1 K/uL (ref 0.0–0.5)
Eosinophils Relative: 1 %
HCT: 34.6 % — ABNORMAL LOW (ref 36.0–46.0)
Hemoglobin: 11.4 g/dL — ABNORMAL LOW (ref 12.0–15.0)
Immature Granulocytes: 0 %
Lymphocytes Relative: 20 %
Lymphs Abs: 1.3 K/uL (ref 0.7–4.0)
MCH: 30.6 pg (ref 26.0–34.0)
MCHC: 32.9 g/dL (ref 30.0–36.0)
MCV: 93 fL (ref 80.0–100.0)
Monocytes Absolute: 0.7 K/uL (ref 0.1–1.0)
Monocytes Relative: 10 %
Neutro Abs: 4.6 K/uL (ref 1.7–7.7)
Neutrophils Relative %: 68 %
Platelets: 175 K/uL (ref 150–400)
RBC: 3.72 MIL/uL — ABNORMAL LOW (ref 3.87–5.11)
RDW: 14.3 % (ref 11.5–15.5)
WBC: 6.8 K/uL (ref 4.0–10.5)
nRBC: 0 % (ref 0.0–0.2)

## 2024-06-19 LAB — LEVETIRACETAM LEVEL: Levetiracetam Lvl: 28.6 ug/mL (ref 10.0–40.0)

## 2024-06-19 MED ORDER — SODIUM CHLORIDE 0.9 % IV BOLUS
1000.0000 mL | Freq: Once | INTRAVENOUS | Status: AC
  Administered 2024-06-19: 1000 mL via INTRAVENOUS

## 2024-06-19 MED ORDER — LACOSAMIDE 100 MG PO TABS
100.0000 mg | ORAL_TABLET | Freq: Two times a day (BID) | ORAL | 0 refills | Status: DC
  Filled 2024-06-19: qty 60, 30d supply, fill #0

## 2024-06-19 MED ORDER — CLONAZEPAM 0.5 MG PO TABS
0.5000 mg | ORAL_TABLET | Freq: Three times a day (TID) | ORAL | 0 refills | Status: AC
  Filled 2024-06-19: qty 90, 30d supply, fill #0

## 2024-06-19 NOTE — Progress Notes (Signed)
 Occupational Therapy Treatment Patient Details Name: Maria [name] MRN: [id number] DOB: 01-04-1961 Today's Date: 06/19/2024   History of present illness Maria [name] is a 64 y.o. female with medical history significant for seizure disorder generalized tonic-clonic seizures, multiple recent falls, ambulatory dysfunction uses a walker at baseline, chronic anxiety, who presents with complaints of increasing frequency of breakthrough seizures.  The patient had good seizure control up until May 25, 2024 when she had a fall, which she attributed to dizziness.  She had a series of fall events after that.  She was weaned off her home Vimpat  and started on Keppra .  Vimpat  was eventually discontinued about 1 week ago.       There were concerns about her falls being a side effect of Vimpat  toxicity.  The patient had multiple falls including a fall that led to left clavicle fracture on 05/25/2024, and another fall on 05/30/2024 that led to right periorbital contusion.  In the past 2 days, she has had more seizures, multiple breakthrough seizures, 4 to 5 today, prior to presenting to the ER.  The patient lives at home with her sister.   OT comments  Pt. Seen for skilled OT treatment session with sister present for duration of session and neurologist present at beginning and end of tx session.  Pt. Required max cues for task initiation and completion. When first attempting to speak with pt. She did not respond and was not looking at me then yelled out a phrase that was not related to what I was saying to her.  2nd occurrence was mid task pt. stopped and would not speak or move and began to lean back while sitting then became alert again.  Bed mobility to eob with MIN A.  Back to bed less assistance but heavier cues required for pt.to scoot towards hob x3 in prep for laying down in better position.  While seated eob pt. Was able to complete stand pivot to the R to bsc for attempts at toileting with MIN A.   Attempting use of L UE during transfer. Transfer to the L back to bed also MIN A, max cues for sequencing.  LB dressing attempts seated eob required more assistance as pt. Was in/out of focused attention to the task. Physically able to reach each LE to don/doff sock and attempting to do so but pt. Stopped and then unable to resume the task once back in attentive state required physical assistance from therapist asst.  Sister reports some support available initially at home but indicated not 24/7 permanent available. (need to confirm these details) At this time pt. Appears that she will require 24/7 assistance for safety during mobilty/ADLs.        If plan is discharge home, recommend the following:  A little help with walking and/or transfers;A lot of help with bathing/dressing/bathroom;Assistance with cooking/housework;Assist for transportation;Help with stairs or ramp for entrance;Supervision due to cognitive status;Direct supervision/assist for medications management   Equipment Recommendations  None recommended by OT    Recommendations for Other Services      Precautions / Restrictions Precautions Precautions: Fall Recall of Precautions/Restrictions: Impaired Precaution/Restrictions Comments: Recent L clavical fracture. Pt should be wearing sling but cannot tolerate due to many recent seizures.       Mobility Bed Mobility Overal bed mobility: Needs Assistance Bed Mobility: Supine to Sit, Sit to Supine     Supine to sit: Min assist, HOB elevated Sit to supine: Contact guard assist, HOB elevated   General bed  mobility comments: increased time, labored movement. Assistance with lines and leads. max cues and physical demo for scooting towards hob before attempting to lay down    Transfers Overall transfer level: Needs assistance Equipment used: None, 1 person hand held assist Transfers: Sit to/from Stand, Bed to chair/wheelchair/BSC Sit to Stand: Min assist     Step pivot  transfers: Min assist     General transfer comment: eob transfer to R to bsc then back to L for back to bed. some attempts at  initiation of use of LUE even with recent clavicle fx on that side     Balance                                           ADL either performed or assessed with clinical judgement   ADL Overall ADL's : Needs assistance/impaired                     Lower Body Dressing: Minimal assistance;Sitting/lateral leans Lower Body Dressing Details (indicate cue type and reason): able to bring each LE towards chest seated eob, but balance was varied and inconsistent requiring therapist asst. intervention for trunk support assist. pt. also in/out of attention so required tactile assistance to complete task that she had initially started but unable to complete with periods of starring off                    Extremity/Trunk Assessment              Vision       Perception     Praxis     Communication Communication Communication: Impaired Factors Affecting Communication: Difficulty expressing self   Cognition Arousal: Alert Behavior During Therapy: Restless, Impulsive Cognition: History of cognitive impairments, Cognition impaired   Orientation impairments: Time, Situation         OT - Cognition Comments: Sister reports the pt has some cognitive impairements at baseline but has been more out of it in the last several days. this is not her normal                 Following commands: Impaired Following commands impaired: Follows one step commands inconsistently, Follows one step commands with increased time      Cueing   Cueing Techniques: Verbal cues, Tactile cues, Gestural cues  Exercises      Shoulder Instructions       General Comments      Pertinent Vitals/ Pain       Pain Assessment Pain Assessment: No/denies pain  Home Living                                          Prior  Functioning/Environment              Frequency  Min 2X/week        Progress Toward Goals  OT Goals(current goals can now be found in the care plan section)  Progress towards OT goals: Progressing toward goals     Plan      Co-evaluation                 AM-PAC OT 6 Clicks Daily Activity     Outcome Measure  End of Session    OT Visit Diagnosis: Unsteadiness on feet (R26.81);Other abnormalities of gait and mobility (R26.89);Muscle weakness (generalized) (M62.81);History of falling (Z91.81);Other symptoms and signs involving cognitive function;Repeated falls (R29.6)   Activity Tolerance Patient tolerated treatment well   Patient Left in bed;with call bell/phone within reach;with bed alarm set;with family/visitor present;Other (comment) (all 4 rails up, neurologist present meeting with pt. and her sister)   Nurse Communication          Time: [phone] OT Time Calculation (min): 12 min  Charges: OT General Charges $OT Visit: 1 Visit OT Treatments $Self Care/Home Management : 8-22 mins  Randall, COTA/L Acute Rehabilitation [phone]   CHRISTELLA Nest Lorraine-COTA/L 06/19/2024, 12:43 PM

## 2024-06-19 NOTE — Procedures (Addendum)
 Patient Name: Maria [name]  MRN: [id number]  Epilepsy Attending: Arlin MALVA Krebs  Referring Physician/Provider: Vann, Jessica U, DO  Duration: 06/18/2024 2324 to 06/19/2024 1209   Patient history: 64 year old female with history of epilepsy now with breakthrough seizures. EEG to evaluate for seizure   Level of alertness: Awake, asleep   AEDs during EEG study: LCM, VPA, Clonazepam    Technical aspects: This EEG study was done with scalp electrodes positioned according to the 10-20 International system of electrode placement. Electrical activity was reviewed with band pass filter of 1-70Hz , sensitivity of 7 uV/mm, display speed of 47mm/sec with a 60Hz  notched filter applied as appropriate. EEG data were recorded continuously and digitally stored.  Video monitoring was available and reviewed as appropriate.   Description: The posterior dominant rhythm consists of 9 Hz activity of moderate voltage (25-35 uV) seen predominantly in posterior head regions, symmetric and reactive to eye opening and eye closing. Sleep was characterized by vertex waves, sleep spindles (12 to 14 Hz), maximal frontocentral region. There is intermittent 3 to 6 Hz theta-delta slowing admixed with 13-15hz  beta activity. Hyperventilation and photic stimulation were not performed.     Event button was pressed on 03/19/2024 at 0944 for staring and not responding. Concomitant eeg before, during and after the event didn't show any eeg change to suggest seizure   ABNORMALITY - Intermittent slow, generalized   IMPRESSION: This study is suggestive of generalized cerebral dysfunction (encephalopathy). No seizures or epileptiform discharges were seen throughout the recording.  Event button was pressed on 03/19/2024 at 0944 for staring and not responding without concomitant eeg change. This was most likely a  NON epileptic event.   Maria [name] O [name]

## 2024-06-19 NOTE — Discharge Summary (Signed)
 Physician Discharge Summary  Maria [name] FMW:[id number] DOB: Dec 28, 1960 DOA: 06/16/2024  PCP: [name], MD  Admit date: 06/16/2024 Discharge date: 06/19/2024 30 Day Unplanned Readmission Risk Score    Flowsheet Row ED to Hosp-Admission (Current) from 06/16/2024 in [facility]  30 Day Unplanned Readmission Risk Score (%) 16.93 Filed at 06/19/2024 0800    This score is the patient's risk of an unplanned readmission within 30 days of being discharged (0 -100%). The score is based on dignosis, age, lab data, medications, orders, and past utilization.   Low:  0-14.9   Medium: 15-21.9   High: 22-29.9   Extreme: 30 and above          Admitted From: Home Disposition: Home  Recommendations for Outpatient Follow-up:  Follow up with PCP in 1-2 weeks Please obtain BMP/CBC in one week Follow-up with neurology within 1 month Please follow up with your PCP on the following pending results: Unresulted Labs (From admission, onward)     Start     Ordered   06/16/24 2059  Lacosamide   Once,   URGENT        06/16/24 2058              Home Health: Yes Equipment/Devices: None  Discharge Condition: Stable CODE STATUS: Full code Diet recommendation:  Diet Order             Diet regular Room service appropriate? Yes; Fluid consistency: Thin  Diet effective now                   Subjective: Patient seen and examined, she is doing well.  Sister and nurse at the bedside.  Sister is also very glad that patient is doing well and she is in agreement with taking her home today.  Brief/Interim Summary: Maria [name] is a 64 y.o. female with medical history significant for seizure disorder generalized tonic-clonic seizures, multiple recent falls, ambulatory dysfunction uses a walker at baseline, chronic anxiety, who presents with complaints of increasing frequency of breakthrough seizures.  The patient had good seizure control up until May 25, 2024 when she had  a fall, which she attributed to dizziness.  She had a series of fall events after that.  She was weaned off her home Vimpat  and started on Keppra .  Patient's hospital stay had been complicated by continued seizures.  However she has remained seizure-free for at least 2 days now.   Acute metabolic encephalopathy secondary to increasing frequency of seizure activity/breakthrough seizures, POA Noncontrast head CT, without acute findings. After admission patient continued to have seizures, requiring IV Ativan .  Per neurology recommendation, patient transferred to [facility] for LTM EEG patient will also be resumed on Vimpat  as seizures got worse after this was stopped.  Patient is fully alert and mostly oriented.  She has been on Vimpat , Klonopin  and Depacon  and she was monitored on continuous EEG for 2 days and per neurology, there were no epileptiform activities found.  Neurology has cleared her for discharge on the above medications.   Frequent multiple falls, POA Was thought to be secondary to Vimpat  toxicity which can cause dizziness Has been off Vimpat  for about a week but now has been resumed.  Seen by PT OT, home health recommended.   AKI, prerenal secondary to dehydration from poor oral intake -Resolved with IV fluids    Acute urinary retention, POA Could not void in the ER, Foley placed in ED on 06/17/2024.  Started  on Flomax .  Removed Foley catheter today, voided successfully.   Presumed UTI, POA UA positive for pyuria however no urinary symptoms endorsed by patient, no leukocytosis and no fever.  Urine culture is negative as well.  UA does not have nitrites, not impressive enough to call UTI.  This was likely asymptomatic bacteriuria.  I planned to discontinue antibiotics yesterday however when I discussed with the sister today, she appears to be convinced that the patient has UTI.  She was very doubtful of my explanation.  Urine culture is negative as well.  Patient has received 2 doses of  Rocephin , per her insistence, we are going to give her third and final dose of Rocephin  today prior to discharge.   Hyperlipidemia Continue Lipitor   Recent surgery, left open reduction internal fixation of clavicle fracture with coracoclavicular ligament repair, POA Pain control as needed Prior to admission, on aspirin  81 mg twice daily for DVT prophylaxis per orthopedic surgery Was due to see Dr. Beverley (who did her surgery), they are aware to reschedule and follow-up with him.  Discharge plan was discussed with patient and/or family member/sister and they verbalized understanding and agreed with it.  Discharge Diagnoses:  Principal Problem:   Increasing frequency of seizure activity [facility])    Discharge Instructions   Allergies as of 06/19/2024       Reactions   Codeine Nausea And Vomiting, Nausea Only   Other Reaction(s): Not available codeine   Lamotrigine Rash, Dermatitis   Other Reaction(s): Systemic vasculitis (code- [id number], SNOMED CT)        Medication List     STOP taking these medications    levETIRAcetam  750 MG tablet Commonly known as: KEPPRA        TAKE these medications    aspirin  EC 81 MG tablet Take 1 tablet (81 mg total) by mouth 2 (two) times daily. To prevent blood clots for 14 days after surgery.   atorvastatin  20 MG tablet Commonly known as: LIPITOR Take 20 mg by mouth at bedtime.   calcium  carbonate 500 MG chewable tablet Commonly known as: TUMS - dosed in mg elemental calcium  Chew 1 tablet by mouth daily as needed for indigestion or heartburn.   CALCIUM /VITAMIN D  PO Take 1 tablet by mouth 2 (two) times daily. 600 mg / 800 units   cholecalciferol 25 MCG (1000 UNIT) tablet Commonly known as: VITAMIN D3 Take 1,000 Units by mouth 2 (two) times daily.   clonazePAM  0.5 MG tablet Commonly known as: KLONOPIN  Take 1 tablet (0.5 mg total) by mouth 3 (three) times daily. What changed: when to take this   Depakote  500 MG DR tablet Generic  drug: divalproex  TAKE 2 TABLETS DAILY What changed:  how much to take when to take this   Gemtesa  75 MG Tabs Generic drug: Vibegron  TAKE 1 TABLET DAILY   HYDROcodone -acetaminophen  5-325 MG tablet Commonly known as: NORCO/VICODIN Take 1 tablet by mouth every 6 (six) hours as needed for severe pain (pain score 7-10).   HYDROcodone -acetaminophen  10-325 MG tablet Commonly known as: NORCO Take 1 tablet by mouth every 6 (six) hours as needed for severe pain (pain score 7-10). in shoulder after surgery for fracture   Lacosamide  100 MG Tabs Take 1 tablet (100 mg total) by mouth 2 (two) times daily. What changed:  medication strength how much to take   meloxicam  15 MG tablet Commonly known as: MOBIC  Take 1 tablet (15 mg total) by mouth daily as needed for pain (and inflammation).   multivitamin capsule  Take 1 capsule by mouth daily.   omeprazole  40 MG capsule Commonly known as: PRILOSEC TAKE 1 CAPSULE DAILY (NEED OFFICE VISIT)   ondansetron  4 MG disintegrating tablet Commonly known as: ZOFRAN -ODT Take 1 tablet (4 mg total) by mouth every 8 (eight) hours as needed for nausea or vomiting.   senna 8.6 MG tablet Commonly known as: SENOKOT Take 2 tablets by mouth daily.   Valtoco  20 MG Dose 2 x 10 MG/0.1ML Lqpk Generic drug: diazePAM  (20 MG Dose) Place 20 mg into the nose as needed. For seizure, use two 10 mg devices, 1 into each nostril        Contact information for follow-up providers     [name], MD Follow up in 1 week(s).   Specialty: Internal Medicine Contact information: [address] [place] [address] [phone]              Contact information for after-discharge care     Home Medical Care     [facility]) .   Service: Home Health Services Contact information: [phone] [place]  [address] [phone]                    Allergies[1]  Consultations:  Neurology   Procedures/Studies: Overnight EEG with video Result Date: 06/18/2024 Shelton Arlin KIDD, MD     06/19/2024  7:15 AM Patient Name: GURPREET MARIANI MRN: [id number] Epilepsy Attending: Arlin KIDD Shelton Referring Physician/Provider: Juvenal Harlene PENNER, DO Duration: 06/17/2024 2324 to 06/18/2024 2324 Patient history: 64 year old female with history of epilepsy now with breakthrough seizures. EEG to evaluate for seizure  Level of alertness: Awake, asleep AEDs during EEG study: LCM, VPA, Clonazepam  Technical aspects: This EEG study was done with scalp electrodes positioned according to the 10-20 International system of electrode placement. Electrical activity was reviewed with band pass filter of 1-70Hz , sensitivity of 7 uV/mm, display speed of 70mm/sec with a 60Hz  notched filter applied as appropriate. EEG data were recorded continuously and digitally stored.  Video monitoring was available and reviewed as appropriate. Description: The posterior dominant rhythm consists of 9 Hz activity of moderate voltage (25-35 uV) seen predominantly in posterior head regions, symmetric and reactive to eye opening and eye closing. Sleep was characterized by vertex waves, sleep spindles (12 to 14 Hz), maximal frontocentral region. There is intermittent 3 to 6 Hz theta-delta slowing admixed with 13-15hz  beta activity. Hyperventilation and photic stimulation were not performed.   ABNORMALITY - Intermittent slow, generalized IMPRESSION: This study is suggestive of generalized cerebral dysfunction (encephalopathy). No seizures or epileptiform discharges were seen throughout the recording. Arlin KIDD Shelton   DG Chest Portable 1 View Result Date: 06/16/2024 EXAM: 1 VIEW(S) XRAY OF THE CHEST 06/16/2024 09:45:00 PM COMPARISON: 05/30/2024 CLINICAL HISTORY: weakness weakness weakness weakness FINDINGS: LUNGS AND PLEURA: No focal pulmonary opacity. No pleural effusion. No pneumothorax. HEART AND MEDIASTINUM: Aortic arch calcifications.  No acute abnormality of the cardiac and mediastinal silhouettes. BONES AND SOFT TISSUES: Right shoulder prosthesis noted. ORIF distal left clavicle noted. No acute osseous abnormality. IMPRESSION: 1. No acute cardiopulmonary abnormality. Electronically signed by: Morgane Naveau MD 06/16/2024 09:50 PM EST RP Workstation: [id number]   CT Head Wo Contrast Result Date: 06/16/2024 EXAM: CT HEAD WITHOUT CONTRAST 06/16/2024 09:21:18 PM TECHNIQUE: CT of the head was performed without the administration of intravenous contrast. Automated exposure control, iterative reconstruction, and/or weight based adjustment of the mA/kV was utilized to reduce the radiation dose to as low  as reasonably achievable. COMPARISON: CT head 05/31/2024. CLINICAL HISTORY: Head trauma, coagulopathy (Age 39-64y); Headache, new onset (Age >= 51y). FINDINGS: BRAIN AND VENTRICLES: Right inferior frontal encephalomalacia. No acute hemorrhage. No evidence of acute infarct. No hydrocephalus. No extra-axial collection. No mass effect or midline shift. ORBITS: Bilateral lens replacement. SINUSES: No acute abnormality. SOFT TISSUES AND SKULL: Right periorbital soft tissue hematoma. No skull fracture. LUNGS: Bilateral lung replacement. IMPRESSION: 1. No acute intracranial abnormality. 2. Right periorbital soft tissue hematoma. Electronically signed by: Morgane Naveau MD 06/16/2024 09:29 PM EST RP Workstation: [id number]   CT Head Wo Contrast Result Date: 05/31/2024 EXAM: CT HEAD, FACIAL BONES AND CERVICAL SPINE WITHOUT CONTRAST 05/31/2024 12:18:56 AM TECHNIQUE: CT of the head, facial bones and cervical spine was performed without the administration of intravenous contrast. Multiplanar reformatted images are provided for review. Automated exposure control, iterative reconstruction, and/or weight based adjustment of the mA/kV was utilized to reduce the radiation dose to as low as reasonably achievable. COMPARISON: CT head 11/22/2023 CLINICAL HISTORY:  Head trauma, moderate-severe FINDINGS: CT HEAD BRAIN AND VENTRICLES: No acute intracranial hemorrhage. No mass effect or midline shift. No extra-axial fluid collection. No evidence of acute infarct. No hydrocephalus. Right frontal encephalomalacia. SKULL AND SCALP: No acute skull fracture. No scalp hematoma. CT FACIAL BONES FACIAL BONES: No acute facial fracture. No mandibular dislocation. No suspicious bone lesion. ORBITS: No acute traumatic injury. SINUSES AND MASTOIDS: No acute abnormality. SOFT TISSUES: No acute abnormality. CT CERVICAL SPINE BONES AND ALIGNMENT: No acute fracture or traumatic malalignment. DEGENERATIVE CHANGES: Moderate to severe degenerative changes including bridging osteophytes and multilevel facet and uncovertebral hypertrophy, which contributes to varying degrees of neural foraminal stenosis. SOFT TISSUES: Right periorbital contusion. IMPRESSION: 1. No acute intracranial abnormality. 2. No acute fracture or traumatic malalignment of the cervical spine. 3. No acute fracture of the facial bones. 4. Right periorbital contusion. Electronically signed by: Gilmore Molt 05/31/2024 12:48 AM EST RP Workstation: [id number]   CT Cervical Spine Wo Contrast Result Date: 05/31/2024 EXAM: CT HEAD, FACIAL BONES AND CERVICAL SPINE WITHOUT CONTRAST 05/31/2024 12:18:56 AM TECHNIQUE: CT of the head, facial bones and cervical spine was performed without the administration of intravenous contrast. Multiplanar reformatted images are provided for review. Automated exposure control, iterative reconstruction, and/or weight based adjustment of the mA/kV was utilized to reduce the radiation dose to as low as reasonably achievable. COMPARISON: CT head 11/22/2023 CLINICAL HISTORY: Head trauma, moderate-severe FINDINGS: CT HEAD BRAIN AND VENTRICLES: No acute intracranial hemorrhage. No mass effect or midline shift. No extra-axial fluid collection. No evidence of acute infarct. No hydrocephalus. Right frontal  encephalomalacia. SKULL AND SCALP: No acute skull fracture. No scalp hematoma. CT FACIAL BONES FACIAL BONES: No acute facial fracture. No mandibular dislocation. No suspicious bone lesion. ORBITS: No acute traumatic injury. SINUSES AND MASTOIDS: No acute abnormality. SOFT TISSUES: No acute abnormality. CT CERVICAL SPINE BONES AND ALIGNMENT: No acute fracture or traumatic malalignment. DEGENERATIVE CHANGES: Moderate to severe degenerative changes including bridging osteophytes and multilevel facet and uncovertebral hypertrophy, which contributes to varying degrees of neural foraminal stenosis. SOFT TISSUES: Right periorbital contusion. IMPRESSION: 1. No acute intracranial abnormality. 2. No acute fracture or traumatic malalignment of the cervical spine. 3. No acute fracture of the facial bones. 4. Right periorbital contusion. Electronically signed by: Gilmore Molt 05/31/2024 12:48 AM EST RP Workstation: [id number]   CT Maxillofacial Wo Contrast Result Date: 05/31/2024 EXAM: CT HEAD, FACIAL BONES AND CERVICAL SPINE WITHOUT CONTRAST 05/31/2024 12:18:56 AM TECHNIQUE: CT of the head, facial bones  and cervical spine was performed without the administration of intravenous contrast. Multiplanar reformatted images are provided for review. Automated exposure control, iterative reconstruction, and/or weight based adjustment of the mA/kV was utilized to reduce the radiation dose to as low as reasonably achievable. COMPARISON: CT head 11/22/2023 CLINICAL HISTORY: Head trauma, moderate-severe FINDINGS: CT HEAD BRAIN AND VENTRICLES: No acute intracranial hemorrhage. No mass effect or midline shift. No extra-axial fluid collection. No evidence of acute infarct. No hydrocephalus. Right frontal encephalomalacia. SKULL AND SCALP: No acute skull fracture. No scalp hematoma. CT FACIAL BONES FACIAL BONES: No acute facial fracture. No mandibular dislocation. No suspicious bone lesion. ORBITS: No acute traumatic injury. SINUSES AND  MASTOIDS: No acute abnormality. SOFT TISSUES: No acute abnormality. CT CERVICAL SPINE BONES AND ALIGNMENT: No acute fracture or traumatic malalignment. DEGENERATIVE CHANGES: Moderate to severe degenerative changes including bridging osteophytes and multilevel facet and uncovertebral hypertrophy, which contributes to varying degrees of neural foraminal stenosis. SOFT TISSUES: Right periorbital contusion. IMPRESSION: 1. No acute intracranial abnormality. 2. No acute fracture or traumatic malalignment of the cervical spine. 3. No acute fracture of the facial bones. 4. Right periorbital contusion. Electronically signed by: Gilmore Molt 05/31/2024 12:48 AM EST RP Workstation: [id number]   DG Chest Portable 1 View Result Date: 05/31/2024 EXAM: 1 VIEW(S) XRAY OF THE CHEST 05/30/2024 11:48:26 PM COMPARISON: 11/21/2023 CLINICAL HISTORY: pain, bruising FINDINGS: LUNGS AND PLEURA: Linear atelectasis or scarring at left lung base. No pleural effusion. No pneumothorax. HEART AND MEDIASTINUM: No acute abnormality of the cardiac and mediastinal silhouettes. BONES AND SOFT TISSUES: Right shoulder arthroplasty in place. No acute osseous abnormality. IMPRESSION: 1. No acute cardiopulmonary abnormality. 2. Right shoulder arthroplasty. 3. Linear atelectasis or scarring at the left lung base. Electronically signed by: Dorethia Molt MD 05/31/2024 12:21 AM EST RP Workstation: [id number]   DG Shoulder Left Result Date: 05/25/2024 EXAM: 1 VIEW(S) XRAY OF THE LEFT SHOULDER 05/25/2024 01:12:00 AM COMPARISON: None available. CLINICAL HISTORY: fall, shoulder pain FINDINGS: BONES AND JOINTS: Glenohumeral joint is normally aligned. No glenohumeral dislocation. Acute fracture of the distal left clavicle with inferior displacement. There is 1 full shaft width of inferior displacement of the distal fragment. No widening of the AC joint. SOFT TISSUES: No abnormal calcifications. Visualized lung is unremarkable. IMPRESSION: 1. Acute  displaced fracture of the distal clavicle. Electronically signed by: Norman Gatlin MD 05/25/2024 01:19 AM EST RP Workstation: [id number]     Discharge Exam: Vitals:   06/19/24 0924 06/19/24 1207  BP: 101/73 (!) 141/79  Pulse: 93 88  Resp: 17 16  Temp: 97.8 F (36.6 C) 98.6 F (37 C)  SpO2: 99% 98%   Vitals:   06/18/24 2341 06/19/24 0405 06/19/24 0924 06/19/24 1207  BP: (!) 159/74 132/64 101/73 (!) 141/79  Pulse: (!) 104 83 93 88  Resp: 18 18 17 16   Temp: 99.4 F (37.4 C) 99 F (37.2 C) 97.8 F (36.6 C) 98.6 F (37 C)  TempSrc: Oral Oral Oral   SpO2: 100% 100% 99% 98%  Weight:      Height:        General: Pt is alert, awake, not in acute distress Cardiovascular: RRR, S1/S2 +, no rubs, no gallops Respiratory: CTA bilaterally, no wheezing, no rhonchi Abdominal: Soft, NT, ND, bowel sounds + Extremities: no edema, no cyanosis    The results of significant diagnostics from this hospitalization (including imaging, microbiology, ancillary and laboratory) are listed below for reference.     Microbiology: Recent Results (from the past 240 hours)  Resp  panel by RT-PCR (RSV, Flu A&B, Covid) Anterior Nasal Swab     Status: None   Collection Time: 06/16/24 10:25 PM   Specimen: Anterior Nasal Swab  Result Value Ref Range Status   SARS Coronavirus 2 by RT PCR NEGATIVE NEGATIVE Final    Comment: (NOTE) SARS-CoV-2 target nucleic acids are NOT DETECTED.  The SARS-CoV-2 RNA is generally detectable in upper respiratory specimens during the acute phase of infection. The lowest concentration of SARS-CoV-2 viral copies this assay can detect is 138 copies/mL. A negative result does not preclude SARS-Cov-2 infection and should not be used as the sole basis for treatment or other patient management decisions. A negative result may occur with  improper specimen collection/handling, submission of specimen other than nasopharyngeal swab, presence of viral mutation(s) within the areas  targeted by this assay, and inadequate number of viral copies(<138 copies/mL). A negative result must be combined with clinical observations, patient history, and epidemiological information. The expected result is Negative.  Fact Sheet for Patients:  bloggercourse.com  Fact Sheet for Healthcare Providers:  seriousbroker.it  This test is no t yet approved or cleared by the [place]  FDA and  has been authorized for detection and/or diagnosis of SARS-CoV-2 by FDA under an Emergency Use Authorization (EUA). This EUA will remain  in effect (meaning this test can be used) for the duration of the COVID-19 declaration under Section 564(b)(1) of the Act, 21 U.S.C.section 360bbb-3(b)(1), unless the authorization is terminated  or revoked sooner.       Influenza A by PCR NEGATIVE NEGATIVE Final   Influenza B by PCR NEGATIVE NEGATIVE Final    Comment: (NOTE) The Xpert Xpress SARS-CoV-2/FLU/RSV plus assay is intended as an aid in the diagnosis of influenza from Nasopharyngeal swab specimens and should not be used as a sole basis for treatment. Nasal washings and aspirates are unacceptable for Xpert Xpress SARS-CoV-2/FLU/RSV testing.  Fact Sheet for Patients: bloggercourse.com  Fact Sheet for Healthcare Providers: seriousbroker.it  This test is not yet approved or cleared by the [place]  FDA and has been authorized for detection and/or diagnosis of SARS-CoV-2 by FDA under an Emergency Use Authorization (EUA). This EUA will remain in effect (meaning this test can be used) for the duration of the COVID-19 declaration under Section 564(b)(1) of the Act, 21 U.S.C. section 360bbb-3(b)(1), unless the authorization is terminated or revoked.     Resp Syncytial Virus by PCR NEGATIVE NEGATIVE Final    Comment: (NOTE) Fact Sheet for  Patients: bloggercourse.com  Fact Sheet for Healthcare Providers: seriousbroker.it  This test is not yet approved or cleared by the [place]  FDA and has been authorized for detection and/or diagnosis of SARS-CoV-2 by FDA under an Emergency Use Authorization (EUA). This EUA will remain in effect (meaning this test can be used) for the duration of the COVID-19 declaration under Section 564(b)(1) of the Act, 21 U.S.C. section 360bbb-3(b)(1), unless the authorization is terminated or revoked.  Performed at [facility], [address], [place], [place] [address]   Urine Culture     Status: None   Collection Time: 06/16/24 10:53 PM   Specimen: Urine, Random  Result Value Ref Range Status   Specimen Description   Final    URINE, RANDOM Performed at [facility], [address], [place], [place] [address]    Special Requests   Final    NONE Reflexed from [id number] Performed at [facility], [address] [place], [place] [address]    Culture   Final  NO GROWTH Performed at [facility], [address] [place], [place] [address]    Report Status 06/18/2024 FINAL  Final     Labs: BNP (last 3 results) No results for input(s): BNP in the last 8760 hours. Basic Metabolic Panel: Recent Labs  Lab 06/16/24 2045 06/17/24 0355 06/19/24 0902  NA 142 142 141  K 4.1 4.2 4.2  CL 105 105 107  CO2 22 26 22   GLUCOSE 124* 96 119*  BUN 22 20 14   CREATININE 1.08* 0.86 0.74  CALCIUM  9.9 8.7* 9.3  MG 1.9 2.0  --   PHOS  --  3.1  --    Liver Function Tests: Recent Labs  Lab 06/16/24 2045  AST 22  ALT 15  ALKPHOS 113  BILITOT 0.4  PROT 6.8  ALBUMIN  4.2   No results for input(s): LIPASE, AMYLASE in the last 168 hours. No results for input(s): AMMONIA in the last 168 hours. CBC: Recent Labs  Lab 06/16/24 2045 06/17/24 0355 06/18/24 1157 06/19/24 0902  WBC 9.1 7.4 8.4 6.8  NEUTROABS 6.6  --  5.9 4.6  HGB  12.0 10.9* 11.0* 11.4*  HCT 37.1 34.5* 33.0* 34.6*  MCV 93.0 95.8 92.4 93.0  PLT 192 186 164 175   Cardiac Enzymes: No results for input(s): CKTOTAL, CKMB, CKMBINDEX, TROPONINI in the last 168 hours. BNP: Invalid input(s): POCBNP CBG: No results for input(s): GLUCAP in the last 168 hours. D-Dimer No results for input(s): DDIMER in the last 72 hours. Hgb A1c No results for input(s): HGBA1C in the last 72 hours. Lipid Profile No results for input(s): CHOL, HDL, LDLCALC, TRIG, CHOLHDL, LDLDIRECT in the last 72 hours. Thyroid  function studies No results for input(s): TSH, T4TOTAL, T3FREE, THYROIDAB in the last 72 hours.  Invalid input(s): FREET3 Anemia work up No results for input(s): VITAMINB12, FOLATE, FERRITIN, TIBC, IRON, RETICCTPCT in the last 72 hours. Urinalysis    Component Value Date/Time   COLORURINE AMBER (A) 06/16/2024 2253   APPEARANCEUR CLOUDY (A) 06/16/2024 2253   LABSPEC 1.029 06/16/2024 2253   PHURINE 5.0 06/16/2024 2253   GLUCOSEU NEGATIVE 06/16/2024 2253   HGBUR NEGATIVE 06/16/2024 2253   BILIRUBINUR NEGATIVE 06/16/2024 2253   KETONESUR NEGATIVE 06/16/2024 2253   PROTEINUR 100 (A) 06/16/2024 2253   UROBILINOGEN 0.2 10/26/2014 2340   NITRITE NEGATIVE 06/16/2024 2253   LEUKOCYTESUR LARGE (A) 06/16/2024 2253   Sepsis Labs Recent Labs  Lab 06/16/24 2045 06/17/24 0355 06/18/24 1157 06/19/24 0902  WBC 9.1 7.4 8.4 6.8   Microbiology Recent Results (from the past 240 hours)  Resp panel by RT-PCR (RSV, Flu A&B, Covid) Anterior Nasal Swab     Status: None   Collection Time: 06/16/24 10:25 PM   Specimen: Anterior Nasal Swab  Result Value Ref Range Status   SARS Coronavirus 2 by RT PCR NEGATIVE NEGATIVE Final    Comment: (NOTE) SARS-CoV-2 target nucleic acids are NOT DETECTED.  The SARS-CoV-2 RNA is generally detectable in upper respiratory specimens during the acute phase of infection. The  lowest concentration of SARS-CoV-2 viral copies this assay can detect is 138 copies/mL. A negative result does not preclude SARS-Cov-2 infection and should not be used as the sole basis for treatment or other patient management decisions. A negative result may occur with  improper specimen collection/handling, submission of specimen other than nasopharyngeal swab, presence of viral mutation(s) within the areas targeted by this assay, and inadequate number of viral copies(<138 copies/mL). A negative result must be combined with clinical  observations, patient history, and epidemiological information. The expected result is Negative.  Fact Sheet for Patients:  bloggercourse.com  Fact Sheet for Healthcare Providers:  seriousbroker.it  This test is no t yet approved or cleared by the [place]  FDA and  has been authorized for detection and/or diagnosis of SARS-CoV-2 by FDA under an Emergency Use Authorization (EUA). This EUA will remain  in effect (meaning this test can be used) for the duration of the COVID-19 declaration under Section 564(b)(1) of the Act, 21 U.S.C.section 360bbb-3(b)(1), unless the authorization is terminated  or revoked sooner.       Influenza A by PCR NEGATIVE NEGATIVE Final   Influenza B by PCR NEGATIVE NEGATIVE Final    Comment: (NOTE) The Xpert Xpress SARS-CoV-2/FLU/RSV plus assay is intended as an aid in the diagnosis of influenza from Nasopharyngeal swab specimens and should not be used as a sole basis for treatment. Nasal washings and aspirates are unacceptable for Xpert Xpress SARS-CoV-2/FLU/RSV testing.  Fact Sheet for Patients: bloggercourse.com  Fact Sheet for Healthcare Providers: seriousbroker.it  This test is not yet approved or cleared by the [place]  FDA and has been authorized for detection and/or diagnosis of SARS-CoV-2 by FDA under  an Emergency Use Authorization (EUA). This EUA will remain in effect (meaning this test can be used) for the duration of the COVID-19 declaration under Section 564(b)(1) of the Act, 21 U.S.C. section 360bbb-3(b)(1), unless the authorization is terminated or revoked.     Resp Syncytial Virus by PCR NEGATIVE NEGATIVE Final    Comment: (NOTE) Fact Sheet for Patients: bloggercourse.com  Fact Sheet for Healthcare Providers: seriousbroker.it  This test is not yet approved or cleared by the [place]  FDA and has been authorized for detection and/or diagnosis of SARS-CoV-2 by FDA under an Emergency Use Authorization (EUA). This EUA will remain in effect (meaning this test can be used) for the duration of the COVID-19 declaration under Section 564(b)(1) of the Act, 21 U.S.C. section 360bbb-3(b)(1), unless the authorization is terminated or revoked.  Performed at [facility], [address] [place], [place] [address]   Urine Culture     Status: None   Collection Time: 06/16/24 10:53 PM   Specimen: Urine, Random  Result Value Ref Range Status   Specimen Description   Final    URINE, RANDOM Performed at [facility], [address], [place], [place] [address]    Special Requests   Final    NONE Reflexed from [id number] Performed at [facility], [address], [place], [place] [address]    Culture   Final    NO GROWTH Performed at [facility], [address], [place], [place] [address]    Report Status 06/18/2024 FINAL  Final    FURTHER DISCHARGE INSTRUCTIONS:   Get Medicines reviewed and adjusted: Please take all your medications with you for your next visit with your Primary MD   Laboratory/radiological data: Please request your Primary MD to go over all hospital tests and procedure/radiological results at the follow up, please ask your Primary MD to get all Hospital records sent to his/her office.   In some cases, they  will be blood work, cultures and biopsy results pending at the time of your discharge. Please request that your primary care M.D. goes through all the records of your hospital data and follows up on these results.   Also Note the following: If you experience worsening of your admission symptoms, develop shortness of breath, life threatening emergency, suicidal or  homicidal thoughts you must seek medical attention immediately by calling 911 or calling your MD immediately  if symptoms less severe.   You must read complete instructions/literature along with all the possible adverse reactions/side effects for all the Medicines you take and that have been prescribed to you. Take any new Medicines after you have completely understood and accpet all the possible adverse reactions/side effects.    patient was instructed, not to drive, operate heavy machinery, perform activities at heights, swimming or participation in water  activities or provide baby-sitting services while on Pain, Sleep and Anxiety Medications; until their outpatient Physician has advised to do so again. Also recommended to not to take more than prescribed Pain, Sleep and Anxiety Medications.  It is not advisable to combine anxiety, sleep and pain medications without talking with your primary care provider.     Wear Seat belts while driving.   Please note: You were cared for by a hospitalist during your hospital stay. Once you are discharged, your primary care physician will handle any further medical issues. Please note that NO REFILLS for any discharge medications will be authorized once you are discharged, as it is imperative that you return to your primary care physician (or establish a relationship with a primary care physician if you do not have one) for your post hospital discharge needs so that they can reassess your need for medications and monitor your lab values  Time coordinating discharge: Over 30 minutes  SIGNED:   Fredia Skeeter, MD  [facility] 06/19/2024, 1:19 PM *Please note that this is a verbal dictation therefore any spelling or grammatical errors are due to the Dragon Medical One system interpretation. If 7PM-7AM, please contact night-coverage [email or web address]     [1]  Allergies Allergen Reactions   Codeine Nausea And Vomiting and Nausea Only    Other Reaction(s): Not available  codeine   Lamotrigine Rash and Dermatitis    Other Reaction(s): Systemic vasculitis (code- [id number], SNOMED CT)

## 2024-06-19 NOTE — Discharge Instructions (Signed)
-  Per [place] DMV statutes, patients with seizures are not allowed to drive until they have been seizure-free for six months. Use caution when using heavy equipment or power tools. Avoid working on ladders or at heights. Take showers instead of baths. Ensure the water temperature is not too high on the home water heater. Do not go swimming alone. Do not lock yourself in a room alone (i.e. bathroom). When caring for infants or small children, sit down when holding, feeding, or changing them to minimize risk of injury to the child in the event you have a seizure. Maintain good sleep hygiene. Avoid alcohol.  If patient has another seizure, call 911 and bring them back to the ED if: A.  The seizure lasts longer than 5 minutes.      B.  The patient doesn't wake shortly after the seizure or has new problems such as difficulty seeing, speaking or moving following the seizure C.  The patient was injured during the seizure D.  The patient has a temperature over 102 F (39C) E.  The patient vomited during the seizure and now is having trouble breathing    During the Seizure   - First, ensure adequate ventilation and place patients on the floor on their left side  Loosen clothing around the neck and ensure the airway is patent. If the patient is clenching the teeth, do not force the mouth open with any object as this can cause severe damage - Remove all items from the surrounding that can be hazardous. The patient may be oblivious to what's happening and may not even know what he or she is doing. If the patient is confused and wandering, either gently guide him/her away and block access to outside areas - Reassure the individual and be comforting - Call 911. In most cases, the seizure ends before EMS arrives. However, there are cases when seizures may last over 3 to 5 minutes. Or the individual may have developed breathing difficulties or severe injuries. If a pregnant patient or a person with diabetes  develops a seizure, it is prudent to call an ambulance. - Finally, if the patient does not regain full consciousness, then call EMS. Most patients will remain confused for about 45 to 90 minutes after a seizure, so you must use judgment in calling for help. - Avoid restraints but make sure the patient is in a bed with padded side rails - Place the individual in a lateral position with the neck slightly flexed; this will help the saliva drain from the mouth and prevent the tongue from falling backward - Remove all nearby furniture and other hazards from the area - Provide verbal assurance as the individual is regaining consciousness - Provide the patient with privacy if possible - Call for help and start treatment as ordered by the caregiver    After the Seizure (Postictal Stage)   After a seizure, most patients experience confusion, fatigue, muscle pain and/or a headache. Thus, one should permit the individual to sleep. For the next few days, reassurance is essential. Being calm and helping reorient the person is also of importance.   Most seizures are painless and end spontaneously. Seizures are not harmful to others but can lead to complications such as stress on the lungs, brain and the heart. Individuals with prior lung problems may develop labored breathing and respiratory distress. 

## 2024-06-19 NOTE — Progress Notes (Signed)
 NSG 0700-Current: Pt voided post foley removal without problem.  Pt rcd IV fluid bolus and last dose of IV ABX prior to discharge.  PT also given influenza vaccine to left deltoid; information sheet reviewed prior to injection.    Discharge paperwork, med list, follow up care reviewed with pt and primary caregiver, sister Katheryn, who verbalized understanding. All questions answered. IV and tele removed.    Per Katheryn, she has enough meds at home and does not wish to pick up the new RX at [facility] today.  TOC updated via securechat.     Pt to be discharged via wheelchair

## 2024-06-19 NOTE — Progress Notes (Signed)
 LTM EEG disconnected - no skin breakdown at [facility]. [facility] notified.

## 2024-06-19 NOTE — Progress Notes (Signed)
 Subjective: No seizures overnight on LTM. Sister mentions an event with her findling with her fingers and then talking the same phrase. She was able to record this on her phone. Reviewed LTM EEG before, during and after the event and no epileptiform discharges or seizures noted.  ROS: negative except above  Examination  Vital signs in last 24 hours: Temp:  [97.8 F (36.6 C)-99.6 F (37.6 C)] 98.6 F (37 C) (01/17 1207) Pulse Rate:  [83-121] 88 (01/17 1207) Resp:  [16-20] 16 (01/17 1207) BP: (101-159)/(64-82) 141/79 (01/17 1207) SpO2:  [98 %-100 %] 98 % (01/17 1207)  General: lying in bed, NAD Neuro: MS: Alert, oriented, follows commands CN: pupils equal and reactive,  EOMI, face symmetric, tongue midline, normal sensation over face, Motor: 5/5 strength in all 4 extremities  Coordination: normal Gait: not tested  Basic Metabolic Panel: Recent Labs  Lab 06/16/24 2045 06/17/24 0355 06/19/24 0902  NA 142 142 141  K 4.1 4.2 4.2  CL 105 105 107  CO2 22 26 22   GLUCOSE 124* 96 119*  BUN 22 20 14   CREATININE 1.08* 0.86 0.74  CALCIUM  9.9 8.7* 9.3  MG 1.9 2.0  --   PHOS  --  3.1  --     CBC: Recent Labs  Lab 06/16/24 2045 06/17/24 0355 06/18/24 1157 06/19/24 0902  WBC 9.1 7.4 8.4 6.8  NEUTROABS 6.6  --  5.9 4.6  HGB 12.0 10.9* 11.0* 11.4*  HCT 37.1 34.5* 33.0* 34.6*  MCV 93.0 95.8 92.4 93.0  PLT 192 186 164 175   LTM EEG: This study is suggestive of generalized cerebral dysfunction (encephalopathy). No seizures or epileptiform discharges were seen throughout the recording.   Event button was pressed on 03/19/2024 at 0944 for staring and not responding without concomitant eeg change. This was most likely a  NON epileptic event.  Coagulation Studies: No results for input(s): LABPROT, INR in the last 72 hours.  Imaging No new brain imaging overnight  ASSESSMENT AND PLAN: 64 year old female with history of epilepsy now with increased to continue breakthrough  seizures. Etiology unclear. UA with ?UTI but Ucx have been negative. In addition to increased frequency of GTC seizures, having episodes with looking around, fiddling with fingers and repeat same phrase and that was a non epileptic event.   Epilepsy with breakthrough seizure - Continue Vimpat  100 mg twice daily, Klonopin  to 0.5 mg 3 times daily and Depakote  500mg  BID - discontinue LTM EEG. - Per sister, patient is very close to her baseline. -as needed IV Versed  or Ativan  2 mg for clinical seizures lasting more than 2 minutes -Discussed plan with patient sister at bedside -Discussed plan with hospitalist via secure chat - Okay to discharge from neuro standpoint. Follow up outpatient with Dr. Gregg.    Thank you for allowing us  to participate in the care of this patient. If you have any further questions, please contact  me or neurohospitalist.     I personally spent a total of 30 minutes in the care of the patient today including getting/reviewing separately obtained history, performing a medically appropriate exam/evaluation, counseling and educating, placing orders, referring and communicating with other health care professionals, documenting clinical information in the EHR, independently interpreting results, and coordinating care.      Nikitas Davtyan [facility]

## 2024-06-21 ENCOUNTER — Telehealth: Payer: Self-pay

## 2024-06-21 ENCOUNTER — Telehealth: Payer: Self-pay | Admitting: Neurology

## 2024-06-21 NOTE — Patient Instructions (Signed)
 Visit Information  Thank you for taking time to visit with me today. Please don't hesitate to contact me if I can be of assistance to you   Please monitor and report: Increased seizure frequency New or worsening symptoms Medication side effects Falls, injuries, confusion Missed doses Pregnancy or planning pregnancy High stress levels   Patient verbalizes understanding of instructions and care plan provided today and agrees to view in MyChart. Active MyChart status and patient understanding of how to access instructions and care plan via MyChart confirmed with patient.     The patient has been provided with contact information for the care management team and has been advised to call with any health related questions or concerns.   Please call the care guide team at [phone] if you need to cancel or reschedule your appointment.   Please call the Suicide and Crisis Lifeline: 988 if you are experiencing a Mental Health or Behavioral Health Crisis or need someone to talk to.  Asheton Viramontes J. Yakub Lodes RN, MSN [facility], [facility] RN Care Manager Direct Dial: [phone]  Fax: [phone] Website: [email or web address]

## 2024-06-21 NOTE — Telephone Encounter (Signed)
 Pt sister called to request to speak to nurse . Pt sister stated that while Pt was in ER they gave pt EEG the patient sister would like to know  does Pt  still need appt on thursday

## 2024-06-21 NOTE — Transitions of Care (Post Inpatient/ED Visit) (Signed)
 "  06/21/2024  Name: Maria [name] MRN: [id number] DOB: 1960-06-23  Today's TOC FU Call Status: Today's TOC FU Call Status:: Successful TOC FU Call Completed TOC FU Call Complete Date: 06/21/24  Patient's Name and Date of Birth confirmed. DOB, Name  Transition Care Management Follow-up Telephone Call Date of Discharge: 06/19/24 Discharge Facility: [facility]) Type of Discharge: Inpatient Admission Primary Inpatient Discharge Diagnosis:: Increasing frequency of seizure activity How have you been since you were released from the hospital?: Better Any questions or concerns?: No  Items Reviewed: Did you receive and understand the discharge instructions provided?: Yes Medications obtained,verified, and reconciled?: Yes (Medications Reviewed) Any new allergies since your discharge?: No Dietary orders reviewed?: Yes Type of Diet Ordered:: Heart Healthy Do you have support at home?: Yes  Medications Reviewed Today: Medications Reviewed Today     Reviewed by Dawnmarie Breon, RN (Case Manager) on 06/21/24 at 1400  Med List Status: <None>   Medication Order Taking? Sig Documenting Provider Last Dose Status Informant  aspirin  EC 81 MG tablet [id number] Yes Take 1 tablet (81 mg total) by mouth 2 (two) times daily. To prevent blood clots for 14 days after surgery. Gawne, Meghan M, PA-C  Active Family Member, Pharmacy Records  atorvastatin  (LIPITOR) 20 MG tablet [id number] Yes Take 20 mg by mouth at bedtime. [provider]  Active Family Member, Pharmacy Records  Calcium  Carb-Cholecalciferol (CALCIUM /VITAMIN D  PO) [id number] Yes Take 1 tablet by mouth 2 (two) times daily. 600 mg / 800 units [provider]  Active Family Member, Pharmacy Records  calcium  carbonate (TUMS - DOSED IN MG ELEMENTAL CALCIUM ) 500 MG chewable tablet [id number] Yes Chew 1 tablet by mouth daily as needed for indigestion or heartburn. [provider]  Active Family Member, Pharmacy Records   cholecalciferol (VITAMIN D3) 25 MCG (1000 UNIT) tablet [id number] Yes Take 1,000 Units by mouth 2 (two) times daily. [provider]  Active Family Member, Pharmacy Records  clonazePAM  (KLONOPIN ) 0.5 MG tablet [id number] Yes Take 1 tablet (0.5 mg total) by mouth 3 (three) times daily. Vernon Ranks, MD  Active   DEPAKOTE  500 MG DR tablet [id number] Yes TAKE 2 TABLETS DAILY Camara, Amadou, MD  Active Family Member, Pharmacy Records  diazePAM , 20 MG Dose, (VALTOCO  20 MG DOSE) 2 x 10 MG/0.1ML LQPK [id number]  Place 20 mg into the nose as needed. For seizure, use two 10 mg devices, 1 into each nostril Gayland Lauraine PARAS, NP  Active Family Member, Pharmacy Records           Med Note DARILYN STEPHANE GORMAN [place] Jun 17, 2024  8:47 AM)    GEMTESA  75 MG TABS [id number]  TAKE 1 TABLET DAILY Matilda Senior, MD  Active Family Member, Pharmacy Records  HYDROcodone -acetaminophen  [facility]) 10-325 MG tablet [id number]  Take 1 tablet by mouth every 6 (six) hours as needed for severe pain (pain score 7-10). in shoulder after surgery for fracture Ted Gerard HERO, PA-C  Active Family Member, Pharmacy Records  HYDROcodone -acetaminophen  (NORCO/VICODIN) 5-325 MG tablet [id number] Yes Take 1 tablet by mouth every 6 (six) hours as needed for severe pain (pain score 7-10). Roselyn Carlin NOVAK, MD  Active Family Member, Pharmacy Records  Lacosamide  100 MG TABS [id number] Yes Take 1 tablet (100 mg total) by mouth 2 (two) times daily. Vernon Ranks, MD  Active   meloxicam  (MOBIC ) 15 MG tablet [id number] Yes Take 1 tablet (15 mg total) by mouth daily as needed for pain (and inflammation). Gawne, Meghan M,  PA-C  Active Family Member, Pharmacy Records  Multiple Vitamin (MULTIVITAMIN) capsule [id number] Yes Take 1 capsule by mouth daily. [provider]  Active Family Member, Pharmacy Records  omeprazole  [facility]) 40 MG capsule [id number] Yes TAKE 1 CAPSULE DAILY (NEED OFFICE VISIT) Eartha Flavors, Toribio, MD  Active Family  Member, Pharmacy Records  ondansetron  (ZOFRAN -ODT) 4 MG disintegrating tablet [id number] Yes Take 1 tablet (4 mg total) by mouth every 8 (eight) hours as needed for nausea or vomiting. Gawne, Meghan M, PA-C  Active Family Member, Pharmacy Records    Discontinued 06/06/20 1731   senna (SENOKOT) 8.6 MG tablet [id number] Yes Take 2 tablets by mouth daily. [provider]  Active Family Member, Pharmacy Records  Med List Note Lauralyn, [place], CPhT 06/07/20 0957): Equilla, Que (Mother) [phone] or [phone] handles pt's meds.            Home Care and Equipment/Supplies: Were Home Health Services Ordered?: NA Any new equipment or medical supplies ordered?: NA  Functional Questionnaire: Do you need assistance with bathing/showering or dressing?: Yes (sister Lorin assists as needed) Do you need assistance with meal preparation?: Yes Do you need assistance with eating?: No Do you have difficulty maintaining continence: No Do you need assistance with getting out of bed/getting out of a chair/moving?: No Do you have difficulty managing or taking your medications?: Yes (sister handles medications)  Follow up appointments reviewed: PCP Follow-up appointment confirmed?: Yes MD Provider Line [phone] Given: No Date of PCP follow-up appointment?: 06/28/24 Follow-up Provider: [facility] Follow-up appointment confirmed?: Yes Date of Specialist follow-up appointment?: 06/24/24 Follow-Up Specialty Provider:: Neurology appointment  SDOH Interventions Today    Flowsheet Row Most Recent Value  SDOH Interventions   Food Insecurity Interventions Intervention Not Indicated  Housing Interventions Intervention Not Indicated  Utilities Interventions Intervention Not Indicated    Charnette Younkin J. Jasim Harari RN, MSN [facility]  [facility], [place], [facility] RN Care Manager Direct Dial: [phone]  Fax: [phone] Website: [email or web address]   "

## 2024-06-22 LAB — LACOSAMIDE: Lacosamide: <0.5 ug/mL — ABNORMAL LOW (ref 5.0–10.0)

## 2024-06-22 NOTE — Telephone Encounter (Signed)
 If patient is doing well since being home from hospital, we can cancel EEG this week.   EEG during hospitalization  Duration: 06/18/2024 2324 to 06/19/2024 1209  ABNORMALITY - Intermittent slow, generalized   IMPRESSION: This study is suggestive of generalized cerebral dysfunction (encephalopathy). No seizures or epileptiform discharges were seen throughout the recording.   Event button was pressed on 03/19/2024 at 0944 for staring and not responding without concomitant eeg change. This was most likely a  NON epileptic event.  ASSESSMENT AND PLAN: 64 year old female with history of epilepsy now with increased to continue breakthrough seizures. Etiology unclear. UA with ?UTI but Ucx have been negative. In addition to increased frequency of GTC seizures, having episodes with looking around, fiddling with fingers and repeat same phrase and that was a non epileptic event.   Epilepsy with breakthrough seizure - Continue Vimpat  100 mg twice daily, Klonopin  to 0.5 mg 3 times daily and Depakote  500mg  BID - discontinue LTM EEG. - Per sister, patient is very close to her baseline. -as needed IV Versed  or Ativan  2 mg for clinical seizures lasting more than 2 minutes -Discussed plan with patient sister at bedside -Discussed plan with hospitalist via secure chat - Okay to discharge from neuro standpoint. Follow up outpatient with Dr. Gregg.

## 2024-06-22 NOTE — Telephone Encounter (Signed)
 Called and offered earlier appt and they accepted 07/01/24 at 8:15am

## 2024-06-24 ENCOUNTER — Other Ambulatory Visit: Admitting: *Deleted

## 2024-07-01 ENCOUNTER — Encounter: Payer: Self-pay | Admitting: Neurology

## 2024-07-01 ENCOUNTER — Ambulatory Visit: Admitting: Neurology

## 2024-07-01 VITALS — BP 155/87 | HR 76 | Ht 66.0 in | Wt 146.0 lb

## 2024-07-01 DIAGNOSIS — E559 Vitamin D deficiency, unspecified: Secondary | ICD-10-CM

## 2024-07-01 DIAGNOSIS — G40909 Epilepsy, unspecified, not intractable, without status epilepticus: Secondary | ICD-10-CM

## 2024-07-01 DIAGNOSIS — Z5181 Encounter for therapeutic drug level monitoring: Secondary | ICD-10-CM

## 2024-07-01 NOTE — Progress Notes (Addendum)
 "  Patient: Maria [name]   Date of Birth: 05-28-1961  Reason for Visit: Follow up History from: Patient, sister  Primary Neurologist: Shazia Mitchener  ASSESSMENT AND PLAN 64 y.o. year old female   1.  Seizures 2.  Recurrent falls  -Continue with Vimpat , will clarify the dose with sister (Addendum: Patient is current taking Vimpat  200 mg twice daily and not 100 mg twice daily) -Continue current dosing of Depakote , Klonopin   -Will check labs today -Valtoco  20 mg for seizure rescue -Use a walker, close supervision -Follow up in April or sooner if worse  No orders of the defined types were placed in this encounter.  HISTORY OF PRESENT ILLNESS: Today 07/01/24 Patient presents today for follow-up, last visit was on December 31.  At that time, she presented with recurrent falls.  Vimpat  level was elevated at 18.  We felt that her falls were secondary to high level of Vimpat .  Vimpat  was decreased with plan to stop and Keppra  was started.  Unfortunately patient did have breakthrough seizures and was admitted to the hospital on January 14.  During that hospital stay, EEG did not show any seizures but diffuse slowing.  She did have an event of staring while on EEG and there was no EEG correlate.  At discharge Vimpat  was restarted 100 mg twice daily but since the thinks that she is giving her 200 mg twice daily.  She is also on the Depakote  and Klonopin .  Sister feels that patient is back to her normal self has not had any additional follow-up.     Update 06/02/24 SS: Here with her sister. She is now living with her sister. Her mother died in 12-03-24. In the last week, she has had more frequent falls. On 12/23, fall with left clavicle fracture, having surgery next week. On 12/24, walking with sister, collapsed, sister reported mild full body shaking, after wards speech was confused. 05/30/24 she had fallen, in the floor, had urinary incontinence, injury to right eyelid. Fall again last night. Crying today at  visit in pain. Before falls, reports feels dizzy. Had recently being treated for URI with Augmentin . Denies missed doses of seizure meds, Klonopin , Depakote , Vimpat . 05/30/24 creatinine 1.01, glucose 132, Depakote  59, ammonia normal. CT head right periorbital contusion. I was able to see the patient with Dr. Gregg. Historically Vimpat  levels are high around 18.   Update 12/22/23 Dr. Gregg: Patient presents today for follow-up, last visit was in 2024/07/03since then she has been doing well, denies any seizure or seizure like activity.  She reports compliance with the medications.  She is accompanied by her brother today.  No other questions or concerns except that 2-months ago she fell and broke her shoulder.  She is currently getting physical therapy.  INTERVAL HISTORY 11/12/2022: SS Here today with her brother. Had a fall last night getting up to use the bathroom, went to urgent care. For right flank pain. Given ibuprofen , tizanidine  today.  No seizures reported. Brother mentions concern for UTI? Brother has noted some confusion. Her mother who manages her medications, is 84. The patient won't do PT. Remains on Klonopin , Depakote , Vimpat . She seems a bit more scattered today than in the past.   04/23/22 SS: Here today with her nephew. No seizures. Remains on Depakote , Klonopin , Vimpat . No falls. No changes to her health. Her mother manages her medications. Her mother is 71. EEG in April 2023 was abnormal due to the presence of frequent spike and slow wave discharges  that is consistent with a generalized epileptogenic potential, also present was diffuse slowing.  No problems or concerns.  HISTORY  Dr.Darril Patriarca 09/04/21: Patient present today for follow-up, she is accompanied by her mother.  Since last visit he denies any additional seizures.  She still on Depakote , Vimpat  and clonazepam .  She reported she has been having increased falls, last fall was 2 weeks ago.  Patient denies any prodrome prior to fall,  denies any weakness, no dizziness, no lightheadedness seizures falls.  Patient thinks she is not having seizures with these falls.  Per mother her seizures are described as generalized tonic-clonic seizures.   REVIEW OF SYSTEMS: Out of a complete 14 system review of symptoms, the patient complains only of the following symptoms, and all other reviewed systems are negative.  See HPI  ALLERGIES: Allergies  Allergen Reactions   Codeine Nausea And Vomiting and Nausea Only    Other Reaction(s): Not available  codeine   Lamotrigine Rash and Dermatitis    Other Reaction(s): Systemic vasculitis (code- 53043991, SNOMED CT)    HOME MEDICATIONS: Outpatient Medications Prior to Visit  Medication Sig Dispense Refill   atorvastatin  (LIPITOR) 20 MG tablet Take 20 mg by mouth at bedtime.     Calcium  Carb-Cholecalciferol (CALCIUM /VITAMIN D  PO) Take 1 tablet by mouth 2 (two) times daily. 600 mg / 800 units     calcium  carbonate (TUMS - DOSED IN MG ELEMENTAL CALCIUM ) 500 MG chewable tablet Chew 1 tablet by mouth daily as needed for indigestion or heartburn.     cholecalciferol (VITAMIN D3) 25 MCG (1000 UNIT) tablet Take 1,000 Units by mouth 2 (two) times daily.     clonazePAM  (KLONOPIN ) 0.5 MG tablet Take 1 tablet (0.5 mg total) by mouth 3 (three) times daily. 90 tablet 0   DEPAKOTE  500 MG DR tablet TAKE 2 TABLETS DAILY 180 tablet 3   diazePAM , 20 MG Dose, (VALTOCO  20 MG DOSE) 2 x 10 MG/0.1ML LQPK Place 20 mg into the nose as needed. For seizure, use two 10 mg devices, 1 into each nostril 5 each 1   GEMTESA  75 MG TABS TAKE 1 TABLET DAILY 90 tablet 3   Lacosamide  100 MG TABS Take 1 tablet (100 mg total) by mouth 2 (two) times daily. 60 tablet 0   meloxicam  (MOBIC ) 15 MG tablet Take 1 tablet (15 mg total) by mouth daily as needed for pain (and inflammation). 30 tablet 0   Multiple Vitamin (MULTIVITAMIN) capsule Take 1 capsule by mouth daily.     omeprazole  (PRILOSEC) 40 MG capsule TAKE 1 CAPSULE DAILY  (NEED OFFICE VISIT) 90 capsule 3   ondansetron  (ZOFRAN -ODT) 4 MG disintegrating tablet Take 1 tablet (4 mg total) by mouth every 8 (eight) hours as needed for nausea or vomiting. 15 tablet 0   senna (SENOKOT) 8.6 MG tablet Take 2 tablets by mouth daily.     aspirin  EC 81 MG tablet Take 1 tablet (81 mg total) by mouth 2 (two) times daily. To prevent blood clots for 14 days after surgery. (Patient not taking: Reported on 07/01/2024) 28 tablet 0   HYDROcodone -acetaminophen  (NORCO) 10-325 MG tablet Take 1 tablet by mouth every 6 (six) hours as needed for severe pain (pain score 7-10). in shoulder after surgery for fracture (Patient not taking: Reported on 07/01/2024) 28 tablet 0   HYDROcodone -acetaminophen  (NORCO/VICODIN) 5-325 MG tablet Take 1 tablet by mouth every 6 (six) hours as needed for severe pain (pain score 7-10). (Patient not taking: Reported on 07/01/2024) 12 tablet 0  No facility-administered medications prior to visit.    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   Dysphagia    Dysuria 03/13/2021   ESBL (extended spectrum beta-lactamase) producing bacteria infection 05/12/2020   Fall 11/09/2015   Fracture    R foot   GERD (gastroesophageal reflux disease)    Headache    Heart failure (HCC) 10/24/2020   Herpes labialis 11/28/2023   History of colonic polyps 08/07/2022    History of shingles 10/2016   Intellectual disability    Mild     Mental retardation    lesion in head   Myoclonus    Overactive bladder 10/24/2020   Pain in right shoulder 08/24/2021   Palliative care by specialist    Pneumonia    Positive colorectal cancer screening using Cologuard test 10/28/2018   Added automatically from request for surgery [id number]     Renal abscess, left 04/14/2020   Seizures (HCC)    Last seizure 05/31/23   Thrombocytopenia    Tremor     PAST SURGICAL HISTORY: Past Surgical History:  Procedure Laterality Date   BIOPSY  09/16/2014   Procedure: BIOPSY;  Surgeon: Claudis RAYMOND Rivet, MD;  Location: [facility];  Service: Endoscopy;;   CATARACT EXTRACTION     both eyes, May of 2015   COLONOSCOPY     COLONOSCOPY WITH PROPOFOL  N/A 11/20/2018   Procedure: COLONOSCOPY WITH PROPOFOL ;  Surgeon: Rivet Claudis RAYMOND, MD;  Location: [facility];  Service: Endoscopy;  Laterality: N/A;   COLONOSCOPY WITH PROPOFOL  N/A 08/07/2022   Procedure: COLONOSCOPY WITH PROPOFOL ;  Surgeon: Eartha Angelia Sieving, MD;  Location: [facility];  Service: Gastroenterology;  Laterality: N/A;  7:30am, asa 1-2   ESOPHAGEAL DILATION N/A 09/16/2014   Procedure: ESOPHAGEAL DILATION WITH 54FR MALONEY DILATOR;  Surgeon: Claudis RAYMOND Rivet, MD;  Location: [facility];  Service: Endoscopy;  Laterality: N/A;   ESOPHAGOGASTRODUODENOSCOPY (EGD) WITH PROPOFOL  N/A 09/16/2014   Procedure: ESOPHAGOGASTRODUODENOSCOPY (EGD) WITH PROPOFOL ;  Surgeon: Claudis RAYMOND Rivet, MD;  Location: [facility];  Service: Endoscopy;  Laterality: N/A;   HEMOSTASIS CLIP PLACEMENT  08/07/2022   Procedure: HEMOSTASIS CLIP PLACEMENT;  Surgeon: Eartha Angelia Sieving, MD;  Location: [facility];  Service: Gastroenterology;;   LAPAROSCOPIC APPENDECTOMY N/A 07/14/2020   Procedure: APPENDECTOMY LAPAROSCOPIC;  Surgeon: Kallie Manuelita BROCKS, MD;  Location: [facility];  Service: General;  Laterality: N/A;   MOUTH SURGERY     ORIF CLAVICULAR FRACTURE Left  06/08/2024   Procedure: OPEN REDUCTION INTERNAL FIXATION (ORIF) CLAVICULAR FRACTURE WITH CORACLAVICULAR LIGAMENT REPAIR;  Surgeon: Beverley Evalene BIRCH, MD;  Location: [facility];  Service: Orthopedics;  Laterality: Left;  orif clavicle left, CC ligament reconstruction   POLYPECTOMY  11/20/2018   Procedure: POLYPECTOMY;  Surgeon: Rivet Claudis RAYMOND, MD;  Location: [facility];  Service: Endoscopy;;  colon   POLYPECTOMY  08/07/2022  Procedure: POLYPECTOMY;  Surgeon: Eartha Flavors, Toribio, MD;  Location: [facility];  Service: Gastroenterology;;   REVERSE SHOULDER ARTHROPLASTY Right 06/12/2023   Procedure: REVERSE SHOULDER ARTHROPLASTY;  Surgeon: Cristy Bonner DASEN, MD;  Location: [facility];  Service: Orthopedics;  Laterality: Right;   Skin graft to gum Right 08/2013   SUBMUCOSAL LIFTING INJECTION  08/07/2022   Procedure: SUBMUCOSAL LIFTING INJECTION;  Surgeon: Eartha Flavors Toribio, MD;  Location: [facility];  Service: Gastroenterology;;   TONSILLECTOMY AND ADENOIDECTOMY     TOTAL ABDOMINAL HYSTERECTOMY      FAMILY HISTORY: Family History  Problem Relation Age of Onset   High Cholesterol Mother    High blood pressure Mother    Diabetes Father     SOCIAL HISTORY: Social History   Socioeconomic History   Marital status: Single    Spouse name: Not on file   Number of children: 0   Years of education: 10   Highest education level: Not on file  Occupational History    Employer: UNEMPLOYED  Tobacco Use   Smoking status: Former    Current packs/day: 0.00    Average packs/day: 1.5 packs/day for 15.0 years (22.5 ttl pk-yrs)    Types: Cigarettes    Start date: 05/23/1991    Quit date: 05/22/2006    Years since quitting: 18.1    Passive exposure: Never   Smokeless tobacco: Never  Vaping Use   Vaping status: Never Used  Substance and Sexual Activity   Alcohol use: No    Alcohol/week: 0.0 standard drinks of alcohol   Drug use: No   Sexual activity: Never    Birth control/protection:  Other-see comments    Comment: Hysterectomy  Other Topics Concern   Not on file  Social History Narrative   Patient is single and lives with her mother Rosezella)      Patient drinks 3 cups of caffeine daily.   Patient is right handed.         Social Drivers of Health   Tobacco Use: Medium Risk (07/01/2024)   Patient History    Smoking Tobacco Use: Former    Smokeless Tobacco Use: Never    Passive Exposure: Never  Programmer, Applications: Not on file  Food Insecurity: No Food Insecurity (06/21/2024)   Epic    Worried About Programme Researcher, Broadcasting/film/video in the Last Year: Never true    Ran Out of Food in the Last Year: Never true  Transportation Needs: No Transportation Needs (06/17/2024)   Epic    Lack of Transportation (Medical): No    Lack of Transportation (Non-Medical): No  Physical Activity: Not on file  Stress: Not on file  Social Connections: Not on file  Intimate Partner Violence: Patient Unable To Answer (06/21/2024)   Epic    Fear of Current or Ex-Partner: Patient unable to answer    Emotionally Abused: Patient unable to answer    Physically Abused: Patient unable to answer    Sexually Abused: Patient unable to answer  Depression (PHQ2-9): Low Risk (12/02/2023)   Depression (PHQ2-9)    PHQ-2 Score: 0  Alcohol Screen: Not on file  Housing: Unknown (06/21/2024)   Epic    Unable to Pay for Housing in the Last Year: No    Number of Times Moved in the Last Year: Not on file    Homeless in the Last Year: No  Utilities: Not At Risk (06/21/2024)   Epic    Threatened with loss of utilities: No  Health Literacy:  Not on file   PHYSICAL EXAM  Vitals:   07/01/24 0755  BP: (!) 155/87  Pulse: 76  Weight: 146 lb (66.2 kg)  Height: 5' 6 (1.676 m)   Body mass index is 23.57 kg/m.  Generalized: Well developed, in no acute distress  Neurological examination  Mentation: Alert, is limited historian, some cognitive impairment, follows most exam commands, in pain today, moaning,  complaining, bruising to right eye Cranial nerve II-XII: Extraocular movements were full, visual field were full on confrontational test. Facial sensation and strength were normal. Left clavicle injury. Motor: The motor testing reveals 5 over 5 strength of all 4 extremities. Good symmetric motor tone is noted throughout.  Sensory: Sensory testing is intact to soft touch on all 4 extremities. No evidence of extinction is noted.  Coordination: Cerebellar testing reveals good finger-nose-finger and heel-to-shin bilaterally.   Gait and station: Gait cautious, looks down, tremor with right hand, wide based, antalgic  DIAGNOSTIC DATA (LABS, IMAGING, TESTING) - I reviewed patient records, labs, notes, testing and imaging myself where available.  Lab Results  Component Value Date   WBC 6.8 06/19/2024   HGB 11.4 (L) 06/19/2024   HCT 34.6 (L) 06/19/2024   MCV 93.0 06/19/2024   PLT 175 06/19/2024      Component Value Date/Time   NA 141 06/19/2024 0902   NA 144 12/22/2023 1613   K 4.2 06/19/2024 0902   CL 107 06/19/2024 0902   CO2 22 06/19/2024 0902   GLUCOSE 119 (H) 06/19/2024 0902   BUN 14 06/19/2024 0902   BUN 27 12/22/2023 1613   CREATININE 0.74 06/19/2024 0902   CALCIUM  9.3 06/19/2024 0902   PROT 6.8 06/16/2024 2045   PROT 6.7 12/22/2023 1613   ALBUMIN  4.2 06/16/2024 2045   ALBUMIN  4.4 12/22/2023 1613   AST 22 06/16/2024 2045   ALT 15 06/16/2024 2045   ALKPHOS 113 06/16/2024 2045   BILITOT 0.4 06/16/2024 2045   BILITOT 0.2 12/22/2023 1613   GFRNONAA >60 06/19/2024 0902   GFRAA >60 06/11/2017 1542   Lab Results  Component Value Date   CHOL  10/13/2009    169        ATP III CLASSIFICATION:  <200     mg/dL   Desirable  799-760  mg/dL   Borderline High  >=759    mg/dL   High          HDL 47 10/13/2009   LDLCALC (H) 10/13/2009    108        Total Cholesterol/HDL:CHD Risk Coronary Heart Disease Risk Table                     Men   Women  1/2 Average Risk   3.4   3.3   Average Risk       5.0   4.4  2 X Average Risk   9.6   7.1  3 X Average Risk  23.4   11.0        Use the calculated Patient Ratio above and the CHD Risk Table to determine the patient's CHD Risk.        ATP III CLASSIFICATION (LDL):  <100     mg/dL   Optimal  899-870  mg/dL   Near or Above                    Optimal  130-159  mg/dL   Borderline  839-810  mg/dL   High  >809  mg/dL   Very High   TRIG 69 94/86/7988   CHOLHDL 3.6 10/13/2009   Lab Results  Component Value Date   HGBA1C 5.7 (H) 04/17/2020   Lab Results  Component Value Date   VITAMINB12 765 11/22/2023   Lab Results  Component Value Date   TSH 0.595 11/22/2023    The patient's condition of epilepsy requires frequent monitoring and adjustments in the treatment plan, reflecting the ongoing complexity of care.  This provider is the continuing focal point for all needed services for this condition.   Pastor Falling, MD  [facility] [address], [address] [place], [place] [address] [phone]  "

## 2024-07-03 LAB — COMPREHENSIVE METABOLIC PANEL WITH GFR
ALT: 11 [IU]/L (ref 0–32)
AST: 13 [IU]/L (ref 0–40)
Albumin: 4.4 g/dL (ref 3.9–4.9)
Alkaline Phosphatase: 113 [IU]/L (ref 49–135)
BUN/Creatinine Ratio: 32 — ABNORMAL HIGH (ref 12–28)
BUN: 28 mg/dL — ABNORMAL HIGH (ref 8–27)
Bilirubin Total: 0.3 mg/dL (ref 0.0–1.2)
CO2: 24 mmol/L (ref 20–29)
Calcium: 10.4 mg/dL — ABNORMAL HIGH (ref 8.7–10.3)
Chloride: 104 mmol/L (ref 96–106)
Creatinine, Ser: 0.87 mg/dL (ref 0.57–1.00)
Globulin, Total: 2.5 g/dL (ref 1.5–4.5)
Glucose: 97 mg/dL (ref 70–99)
Potassium: 5 mmol/L (ref 3.5–5.2)
Sodium: 145 mmol/L — ABNORMAL HIGH (ref 134–144)
Total Protein: 6.9 g/dL (ref 6.0–8.5)
eGFR: 75 mL/min/{1.73_m2}

## 2024-07-03 LAB — LACOSAMIDE: Lacosamide: 15.8 ug/mL — AB (ref 5.0–10.0)

## 2024-07-03 LAB — VITAMIN D 25 HYDROXY (VIT D DEFICIENCY, FRACTURES): Vit D, 25-Hydroxy: 44 ng/mL (ref 30.0–100.0)

## 2024-07-03 LAB — VALPROIC ACID LEVEL: Valproic Acid Lvl: 65 ug/mL (ref 50–100)

## 2024-07-05 ENCOUNTER — Ambulatory Visit: Payer: Self-pay | Admitting: Neurology

## 2024-07-05 MED ORDER — LACOSAMIDE 150 MG PO TABS: 150.0000 mg | ORAL_TABLET | Freq: Two times a day (BID) | ORAL | 3 refills | Status: AC

## 2024-07-14 ENCOUNTER — Ambulatory Visit: Admitting: Neurology

## 2024-08-09 ENCOUNTER — Ambulatory Visit: Admitting: Urology

## 2024-09-24 ENCOUNTER — Ambulatory Visit: Admitting: Neurology

## 2024-12-23 ENCOUNTER — Ambulatory Visit: Admitting: Neurology

## 2024-12-28 ENCOUNTER — Ambulatory Visit: Admitting: Neurology
